# Patient Record
Sex: Male | Born: 1960
Health system: Southern US, Community
[De-identification: ages and names within clinical notes are randomized; demographics above are authoritative.]

## PROBLEM LIST (undated history)

## (undated) ENCOUNTER — Emergency Department (HOSPITAL_COMMUNITY): Payer: Self-pay | Source: Home / Self Care

## (undated) DIAGNOSIS — E119 Type 2 diabetes mellitus without complications: Secondary | ICD-10-CM

## (undated) DIAGNOSIS — K760 Fatty (change of) liver, not elsewhere classified: Secondary | ICD-10-CM

## (undated) DIAGNOSIS — M14671 Charcot's joint, right ankle and foot: Secondary | ICD-10-CM

## (undated) DIAGNOSIS — M48061 Spinal stenosis, lumbar region without neurogenic claudication: Secondary | ICD-10-CM

## (undated) DIAGNOSIS — M25811 Other specified joint disorders, right shoulder: Secondary | ICD-10-CM

## (undated) DIAGNOSIS — I10 Essential (primary) hypertension: Secondary | ICD-10-CM

## (undated) DIAGNOSIS — M7541 Impingement syndrome of right shoulder: Secondary | ICD-10-CM

## (undated) DIAGNOSIS — I4891 Unspecified atrial fibrillation: Secondary | ICD-10-CM

## (undated) DIAGNOSIS — M51369 Other intervertebral disc degeneration, lumbar region without mention of lumbar back pain or lower extremity pain: Secondary | ICD-10-CM

## (undated) DIAGNOSIS — M67919 Unspecified disorder of synovium and tendon, unspecified shoulder: Secondary | ICD-10-CM

## (undated) DIAGNOSIS — E669 Obesity, unspecified: Secondary | ICD-10-CM

## (undated) DIAGNOSIS — M5136 Other intervertebral disc degeneration, lumbar region: Secondary | ICD-10-CM

## (undated) HISTORY — PX: KNEE ARTHROSCOPY W/ ACL RECONSTRUCTION: SHX1858

---

## 1988-03-30 HISTORY — PX: ANTERIOR CRUCIATE LIGAMENT REPAIR: SHX115

## 1997-09-13 ENCOUNTER — Emergency Department (HOSPITAL_COMMUNITY): Admission: EM | Admit: 1997-09-13 | Discharge: 1997-09-13 | Payer: Self-pay | Admitting: Emergency Medicine

## 2001-03-02 ENCOUNTER — Emergency Department (HOSPITAL_COMMUNITY): Admission: EM | Admit: 2001-03-02 | Discharge: 2001-03-02 | Payer: Self-pay | Admitting: Emergency Medicine

## 2001-03-02 ENCOUNTER — Encounter: Payer: Self-pay | Admitting: Emergency Medicine

## 2003-11-21 ENCOUNTER — Emergency Department (HOSPITAL_COMMUNITY): Admission: EM | Admit: 2003-11-21 | Discharge: 2003-11-21 | Payer: Self-pay | Admitting: Emergency Medicine

## 2008-05-09 ENCOUNTER — Emergency Department (HOSPITAL_COMMUNITY): Admission: EM | Admit: 2008-05-09 | Discharge: 2008-05-10 | Payer: Self-pay | Admitting: Emergency Medicine

## 2010-04-09 ENCOUNTER — Inpatient Hospital Stay (HOSPITAL_COMMUNITY)
Admission: EM | Admit: 2010-04-09 | Discharge: 2010-04-13 | Payer: Self-pay | Source: Home / Self Care | Attending: Internal Medicine | Admitting: Internal Medicine

## 2010-04-14 LAB — GLUCOSE, CAPILLARY
Glucose-Capillary: 193 mg/dL — ABNORMAL HIGH (ref 70–99)
Glucose-Capillary: 134 mg/dL — ABNORMAL HIGH (ref 70–99)
Glucose-Capillary: 182 mg/dL — ABNORMAL HIGH (ref 70–99)
Glucose-Capillary: 216 mg/dL — ABNORMAL HIGH (ref 70–99)
Glucose-Capillary: 103 mg/dL — ABNORMAL HIGH (ref 70–99)
Glucose-Capillary: 140 mg/dL — ABNORMAL HIGH (ref 70–99)
Glucose-Capillary: 204 mg/dL — ABNORMAL HIGH (ref 70–99)
Glucose-Capillary: 158 mg/dL — ABNORMAL HIGH (ref 70–99)
Glucose-Capillary: 173 mg/dL — ABNORMAL HIGH (ref 70–99)
Glucose-Capillary: 123 mg/dL — ABNORMAL HIGH (ref 70–99)
Glucose-Capillary: 161 mg/dL — ABNORMAL HIGH (ref 70–99)
Glucose-Capillary: 144 mg/dL — ABNORMAL HIGH (ref 70–99)
Glucose-Capillary: 99 mg/dL (ref 70–99)
Glucose-Capillary: 142 mg/dL — ABNORMAL HIGH (ref 70–99)

## 2010-04-14 LAB — CBC
HCT: 46.7 % (ref 39.0–52.0)
HCT: 43.5 % (ref 39.0–52.0)
Hemoglobin: 16 g/dL (ref 13.0–17.0)
Hemoglobin: 14.7 g/dL (ref 13.0–17.0)
MCH: 31.9 pg (ref 26.0–34.0)
MCH: 31.8 pg (ref 26.0–34.0)
MCHC: 34.3 g/dL (ref 30.0–36.0)
MCHC: 33.8 g/dL (ref 30.0–36.0)
MCV: 93 fL (ref 78.0–100.0)
MCV: 94.2 fL (ref 78.0–100.0)
Platelets: 187 10*3/uL (ref 150–400)
Platelets: 156 10*3/uL (ref 150–400)
RBC: 5.02 MIL/uL (ref 4.22–5.81)
RBC: 4.62 MIL/uL (ref 4.22–5.81)
RDW: 13.1 % (ref 11.5–15.5)
RDW: 13.1 % (ref 11.5–15.5)
WBC: 14.2 10*3/uL — ABNORMAL HIGH (ref 4.0–10.5)
WBC: 11.7 10*3/uL — ABNORMAL HIGH (ref 4.0–10.5)

## 2010-04-14 LAB — POCT I-STAT, CHEM 8
BUN: 12 mg/dL (ref 6–23)
Calcium, Ion: 1.11 mmol/L — ABNORMAL LOW (ref 1.12–1.32)
Chloride: 106 mEq/L (ref 96–112)
Creatinine, Ser: 0.9 mg/dL (ref 0.4–1.5)
Glucose, Bld: 148 mg/dL — ABNORMAL HIGH (ref 70–99)
HCT: 48 % (ref 39.0–52.0)
Hemoglobin: 16.3 g/dL (ref 13.0–17.0)
Potassium: 4.3 mEq/L (ref 3.5–5.1)
Sodium: 137 mEq/L (ref 135–145)
TCO2: 24 mmol/L (ref 0–100)

## 2010-04-14 LAB — HEMOGLOBIN A1C
Hgb A1c MFr Bld: 8.1 % — ABNORMAL HIGH (ref <5.7)
Mean Plasma Glucose: 186 mg/dL — ABNORMAL HIGH (ref <117)

## 2010-04-14 LAB — COMPREHENSIVE METABOLIC PANEL
ALT: 29 U/L (ref 0–53)
AST: 21 U/L (ref 0–37)
Albumin: 3 g/dL — ABNORMAL LOW (ref 3.5–5.2)
Alkaline Phosphatase: 64 U/L (ref 39–117)
BUN: 9 mg/dL (ref 6–23)
CO2: 26 mEq/L (ref 19–32)
Calcium: 8.5 mg/dL (ref 8.4–10.5)
Chloride: 106 mEq/L (ref 96–112)
Creatinine, Ser: 0.72 mg/dL (ref 0.4–1.5)
GFR calc Af Amer: >60 mL/min (ref >60)
GFR calc non Af Amer: >60 mL/min (ref >60)
Glucose, Bld: 116 mg/dL — ABNORMAL HIGH (ref 70–99)
Potassium: 4.1 mEq/L (ref 3.5–5.1)
Sodium: 137 mEq/L (ref 135–145)
Total Bilirubin: 0.8 mg/dL (ref 0.3–1.2)
Total Protein: 5.9 g/dL — ABNORMAL LOW (ref 6.0–8.3)

## 2010-04-14 LAB — WOUND CULTURE

## 2010-04-14 LAB — BASIC METABOLIC PANEL
BUN: 11 mg/dL (ref 6–23)
BUN: 8 mg/dL (ref 6–23)
CO2: 26 mEq/L (ref 19–32)
CO2: 26 mEq/L (ref 19–32)
Calcium: 8.6 mg/dL (ref 8.4–10.5)
Calcium: 9.3 mg/dL (ref 8.4–10.5)
Chloride: 106 mEq/L (ref 96–112)
Chloride: 104 mEq/L (ref 96–112)
Creatinine, Ser: 0.84 mg/dL (ref 0.4–1.5)
Creatinine, Ser: 0.86 mg/dL (ref 0.4–1.5)
GFR calc Af Amer: >60 mL/min (ref >60)
GFR calc Af Amer: >60 mL/min (ref >60)
GFR calc non Af Amer: >60 mL/min (ref >60)
GFR calc non Af Amer: >60 mL/min (ref >60)
Glucose, Bld: 140 mg/dL — ABNORMAL HIGH (ref 70–99)
Glucose, Bld: 116 mg/dL — ABNORMAL HIGH (ref 70–99)
Potassium: 3.9 mEq/L (ref 3.5–5.1)
Potassium: 4.2 mEq/L (ref 3.5–5.1)
Sodium: 140 mEq/L (ref 135–145)
Sodium: 138 mEq/L (ref 135–145)

## 2010-04-14 LAB — DIFFERENTIAL
Basophils Absolute: 0 10*3/uL (ref 0.0–0.1)
Basophils Absolute: 0 10*3/uL (ref 0.0–0.1)
Basophils Relative: 0 % (ref 0–1)
Basophils Relative: 0 % (ref 0–1)
Eosinophils Absolute: 0.1 10*3/uL (ref 0.0–0.7)
Eosinophils Absolute: 0.2 10*3/uL (ref 0.0–0.7)
Eosinophils Relative: 1 % (ref 0–5)
Eosinophils Relative: 2 % (ref 0–5)
Lymphocytes Relative: 8 % — ABNORMAL LOW (ref 12–46)
Lymphocytes Relative: 13 % (ref 12–46)
Lymphs Abs: 1.1 10*3/uL (ref 0.7–4.0)
Lymphs Abs: 1.5 10*3/uL (ref 0.7–4.0)
Monocytes Absolute: 1.6 10*3/uL — ABNORMAL HIGH (ref 0.1–1.0)
Monocytes Absolute: 1.5 10*3/uL — ABNORMAL HIGH (ref 0.1–1.0)
Monocytes Relative: 12 % (ref 3–12)
Monocytes Relative: 13 % — ABNORMAL HIGH (ref 3–12)
Neutro Abs: 11.3 10*3/uL — ABNORMAL HIGH (ref 1.7–7.7)
Neutro Abs: 8.4 10*3/uL — ABNORMAL HIGH (ref 1.7–7.7)
Neutrophils Relative %: 80 % — ABNORMAL HIGH (ref 43–77)
Neutrophils Relative %: 72 % (ref 43–77)

## 2010-04-14 LAB — LIPID PANEL
Cholesterol: 192 mg/dL (ref 0–200)
HDL: 50 mg/dL (ref >39)
LDL Cholesterol: 110 mg/dL — ABNORMAL HIGH (ref 0–99)
Total CHOL/HDL Ratio: 3.8 RATIO
Triglycerides: 161 mg/dL — ABNORMAL HIGH (ref <150)
VLDL: 32 mg/dL (ref 0–40)

## 2010-04-14 LAB — TSH: TSH: 3.562 u[IU]/mL (ref 0.350–4.500)

## 2010-04-16 LAB — ANAEROBIC CULTURE

## 2010-05-27 ENCOUNTER — Ambulatory Visit (INDEPENDENT_AMBULATORY_CARE_PROVIDER_SITE_OTHER): Payer: Self-pay | Admitting: Family Medicine

## 2010-05-27 ENCOUNTER — Encounter: Payer: Self-pay | Admitting: Family Medicine

## 2010-05-27 DIAGNOSIS — M25519 Pain in unspecified shoulder: Secondary | ICD-10-CM

## 2010-05-27 DIAGNOSIS — M24819 Other specific joint derangements of unspecified shoulder, not elsewhere classified: Secondary | ICD-10-CM

## 2010-06-05 NOTE — Assessment & Plan Note (Signed)
Summary: NP,SHOULDER INJURY FROM FOOTBALL X 25 YEARS,[facility]   Vital Signs:  Patient profile:   50 year old male Height:      73 inches Weight:      292 pounds BMI:     38.66 BP sitting:   157 / 116  Vitals Entered By: Caesar Chestnut RN (May 27, 2010 2:07 PM)  History of Present Illness: Three months ago fell on elbow in friends yard -- Drove up into shoulder joint Pain on anterior aspect of shoulder. Dull pain at all times - Intense sharp pain   Ibuprofen 10 in AM -daily - helps Ice - made worse  HX: Hospital for infection - had X-ray January 11 or 12th of Right shoulder Broken right clavicle 23 years ago  The patient noted above presents with shoulder pain that has been ongoing for 3 mo there is h/o falling on his elbow onto a tree stump The patient denies neck pain or radicular symptoms. Denies dislocation, subluxation, separation of the shoulder. The patient does complain of pain in the overhead plane with abd  Medications Tried: advil Tried PT: No  Prior shoulder Injury: midshaft R clavicle fracture, displaced, at age 61  Handedness: R  XR, R shoulder series independently reviewed There is evidence of prior displaced clavicle fracture with adequate ossification but large bony ossification seen. minimal impingement anatomy. effectively no OA.  Past History:  Past Medical History: DM 2 HTN L hand infection, hosp, IV ABX (02/2010)  Family History: DM F HTN F CAD F  Social History: TOB + 3-4 beers a day not working  Review of Systems       REVIEW OF SYSTEMS  GEN: No systemic complaints, no fevers, chills, sweats, or other acute illnesses MSK: Detailed in the HPI GI: tolerating PO intake without difficulty Neuro: No numbness, parasthesias, or tingling associated. Otherwise the pertinent positives of the ROS are noted above.    Physical Exam  General:  GEN: Well-developed,well-nourished,in no acute distress; alert,appropriate and cooperative  throughout examination HEENT: Normocephalic and atraumatic without obvious abnormalities. No apparent alopecia or balding. Ears, externally no deformities PULM: Breathing comfortably in no respiratory distress EXT: No clubbing, cyanosis, or edema PSYCH: Normally interactive. Cooperative during the interview. Pleasant. Friendly and conversant. Not anxious or depressed appearing. Normal, full affect.  Msk:  Shoulder: R Inspection: bony prominence, mid clavicle Ecchymosis/edema: neg  AC joint, scapula, clavicle: mild ttp ac joint Cervical spine: NT, full ROM Spurling's: neg Abduction: full, 4+/5 - painful arc, painful resistance Flexion: full, 5/5 IR, full, lift-off: 5/5 ER at neutral: full, 5/5 AC crossover and compression: mild pos Neer: neg Hawkins: pos Drop Test: neg Empty Can: pos Supraspinatus insertion: NT Bicipital groove: NT Speed's: neg Yergason's: neg APPREHENSION POS CRANK AND CLUNK POS, LIGHT OBRIENS EQUIVOCAL JOBE RELOCATION TEST IS POSITIVE Sulcus sign: neg Scapular dyskinesis: none C5-T1 intact Sensation intact Grip 5/5    Impression & Recommendations:  Problem # 1:  SHOULDER JOINT INSTABILITY (ICD-718.81) Assessment New  probable subluxation event, now with instability and apprehension, causing secondary impingement.  for pain control. SubAC Injection Verbal consent was obtained from the patient. Risks, benefits, and alternatives were explained. Patient prepped with Betadine and Ethyl Chloride used for anesthesia. The subacromial space was injected using the posterior approach. The patient tolerated the procedure well and had decreased pain post injection. No complications. Injection: 9 cc of Lidocaine 1% and 1cc of Kenalog 40 mg. Needle: 22 gauge   reviewed rtc and scapular stabilization PT a good  idea, will call when insurance sorted out / medicaid or assistance.  Orders: Joint Aspirate / Injection, Large (20610) Kenalog 10mg  (4units)  (J3301)  Problem # 2:  SHOULDER PAIN, RIGHT (ICD-719.41) Assessment: New  Orders: Joint Aspirate / Injection, Large (20610) Kenalog 10mg  (4units) (J3301)   Orders Added: 1)  New Patient Level III [id number] 2)  Joint Aspirate / Injection, Large [id number] 3)  Kenalog 10mg  (4units) [J3301]

## 2010-07-08 ENCOUNTER — Ambulatory Visit: Payer: Self-pay | Admitting: Family Medicine

## 2010-09-11 ENCOUNTER — Emergency Department (HOSPITAL_COMMUNITY)
Admission: EM | Admit: 2010-09-11 | Discharge: 2010-09-11 | Disposition: A | Discharge status: Home / Self Care | Payer: Self-pay | Attending: Emergency Medicine | Admitting: Emergency Medicine

## 2010-09-11 ENCOUNTER — Emergency Department (HOSPITAL_COMMUNITY): Payer: Self-pay

## 2010-09-11 DIAGNOSIS — X58XXXA Exposure to other specified factors, initial encounter: Secondary | ICD-10-CM | POA: Insufficient documentation

## 2010-09-11 DIAGNOSIS — Y9229 Other specified public building as the place of occurrence of the external cause: Secondary | ICD-10-CM | POA: Insufficient documentation

## 2010-09-11 DIAGNOSIS — R079 Chest pain, unspecified: Secondary | ICD-10-CM | POA: Insufficient documentation

## 2010-09-11 DIAGNOSIS — S20219A Contusion of unspecified front wall of thorax, initial encounter: Secondary | ICD-10-CM | POA: Insufficient documentation

## 2013-02-09 ENCOUNTER — Emergency Department (HOSPITAL_COMMUNITY)
Admission: EM | Admit: 2013-02-09 | Discharge: 2013-02-09 | Disposition: A | Discharge status: Home / Self Care | Payer: Self-pay | Attending: Emergency Medicine | Admitting: Emergency Medicine

## 2013-02-09 ENCOUNTER — Encounter (HOSPITAL_COMMUNITY): Payer: Self-pay | Admitting: Emergency Medicine

## 2013-02-09 DIAGNOSIS — I10 Essential (primary) hypertension: Secondary | ICD-10-CM | POA: Insufficient documentation

## 2013-02-09 DIAGNOSIS — Z203 Contact with and (suspected) exposure to rabies: Secondary | ICD-10-CM | POA: Insufficient documentation

## 2013-02-09 DIAGNOSIS — F17200 Nicotine dependence, unspecified, uncomplicated: Secondary | ICD-10-CM | POA: Insufficient documentation

## 2013-02-09 DIAGNOSIS — Z23 Encounter for immunization: Secondary | ICD-10-CM | POA: Insufficient documentation

## 2013-02-09 DIAGNOSIS — E119 Type 2 diabetes mellitus without complications: Secondary | ICD-10-CM | POA: Insufficient documentation

## 2013-02-09 HISTORY — DX: Essential (primary) hypertension: I10

## 2013-02-09 HISTORY — DX: Type 2 diabetes mellitus without complications: E11.9

## 2013-02-09 MED ORDER — RABIES IMMUNE GLOBULIN 150 UNIT/ML IM INJ
20.0000 [IU]/kg | INJECTION | Freq: Once | INTRAMUSCULAR | Status: AC
  Administered 2013-02-09: 2625 [IU] via INTRAMUSCULAR
  Filled 2013-02-09: qty 17.5

## 2013-02-09 MED ORDER — RABIES VACCINE, PCEC IM SUSR
1.0000 mL | Freq: Once | INTRAMUSCULAR | Status: AC
  Administered 2013-02-09: 1 mL via INTRAMUSCULAR
  Filled 2013-02-09: qty 1

## 2013-02-09 NOTE — ED Provider Notes (Signed)
CSN: [id number]     Arrival date & time 02/09/13  1500 History  This chart was scribed for non-physician practitioner, Delos Haring, PA-C working with Merryl Hacker, MD by Frederich Balding, ED scribe. This patient was seen in room WTR6/WTR6 and the patient's care was started at 3:25 PM.   Chief Complaint  Patient presents with  . Rabies Injection    exposed to rabid animal on saturday   The history is provided by the patient. No language interpreter was used.   HPI Comments: Jeffery [name] is a 52 y.o. male who presents to the Emergency Department complaining of rabies exposure 5 days ago. Pt picked up a dead racoon and was referred by the health department today. They called him and confirmed that the racoon did have rabies. He was never bit by the [place]. Pt's last tetanus was one year ago. He denies any current symptoms.   Past Medical History  Diagnosis Date  . Diabetes mellitus without complication   . Hypertension    Past Surgical History  Procedure Laterality Date  . Knee surgery     Family History  Problem Relation Age of Onset  . Diabetes Father   . Hypertension Father   . Heart failure Father    History  Substance Use Topics  . Smoking status: Current Some Day Smoker  . Smokeless tobacco: Not on file  . Alcohol Use: Yes    Review of Systems  All other systems reviewed and are negative.    Allergies  Review of patient's allergies indicates no known allergies.  Home Medications   Current Outpatient Rx  Name  Route  Sig  Dispense  Refill  . ibuprofen (ADVIL,MOTRIN) 200 MG tablet   Oral   Take 200 mg by mouth every 6 (six) hours as needed (cramping).           BP 163/86  Pulse 90  Temp(Src) 98.2 F (36.8 C) (Oral)  Resp 20  SpO2 96%  Physical Exam  Nursing note and vitals reviewed. Constitutional: He is oriented to person, place, and time. He appears well-developed and well-nourished. No distress.  HENT:  Head: Normocephalic and  atraumatic.  Eyes: EOM are normal.  Neck: Neck supple. No tracheal deviation present.  Cardiovascular: Normal rate, regular rhythm and normal heart sounds.   No murmur heard. Pulmonary/Chest: Effort normal and breath sounds normal. No respiratory distress. He has no wheezes. He has no rales.  Musculoskeletal: Normal range of motion.  Neurological: He is alert and oriented to person, place, and time.  Skin: Skin is warm and dry.  Psychiatric: He has a normal mood and affect. His behavior is normal.    ED Course  Procedures (including critical care time)  DIAGNOSTIC STUDIES: Oxygen Saturation is 96% on RA, normal by my interpretation.    COORDINATION OF CARE: 3:29 PM-Discussed treatment plan which includes rabies vaccination with pt at bedside and pt agreed to plan.   Labs Review Labs Reviewed - No data to display Imaging Review No results found.  EKG Interpretation   None       MDM   1. Rabies exposure     52 y.o.Jeffery [name]'s evaluation in the Emergency Department is complete. It has been determined that no acute conditions requiring further emergency intervention are present at this time. The patient/guardian have been advised of the diagnosis and plan. We have discussed signs and symptoms that warrant return to the ED, such as changes or worsening in symptoms.  Vital signs are stable at discharge. Filed Vitals:   02/09/13 1506  BP: 163/86  Pulse: 90  Temp: 98.2 F (36.8 C)  Resp: 20    Patient/guardian has voiced understanding and agreed to follow-up with the PCP or specialist.   I personally performed the services described in this documentation, which was scribed in my presence. The recorded information has been reviewed and is accurate.   Linus Mako, PA-C 02/09/13 [phone]

## 2013-02-09 NOTE — Progress Notes (Signed)
[facility] provided pt with a list of primary care resources and a [facility] Orange Card application.  °

## 2013-02-09 NOTE — ED Provider Notes (Signed)
  Medical screening examination/treatment/procedure(s) were performed by non-physician practitioner and as supervising physician I was immediately available for consultation/collaboration.      Carmin Muskrat, MD 02/09/13 2013

## 2013-02-09 NOTE — ED Notes (Signed)
Pt was exposed to a confirmed-rabid racoon 5 days ago. Referred by health department

## 2013-02-12 ENCOUNTER — Emergency Department (HOSPITAL_COMMUNITY)
Admission: EM | Admit: 2013-02-12 | Discharge: 2013-02-12 | Disposition: A | Discharge status: Home / Self Care | Payer: Self-pay | Attending: Emergency Medicine | Admitting: Emergency Medicine

## 2013-02-12 ENCOUNTER — Encounter (HOSPITAL_COMMUNITY): Payer: Self-pay | Admitting: Emergency Medicine

## 2013-02-12 DIAGNOSIS — E119 Type 2 diabetes mellitus without complications: Secondary | ICD-10-CM | POA: Insufficient documentation

## 2013-02-12 DIAGNOSIS — Z23 Encounter for immunization: Secondary | ICD-10-CM | POA: Insufficient documentation

## 2013-02-12 DIAGNOSIS — F17200 Nicotine dependence, unspecified, uncomplicated: Secondary | ICD-10-CM | POA: Insufficient documentation

## 2013-02-12 DIAGNOSIS — I10 Essential (primary) hypertension: Secondary | ICD-10-CM | POA: Insufficient documentation

## 2013-02-12 MED ORDER — RABIES VACCINE, PCEC IM SUSR
1.0000 mL | Freq: Once | INTRAMUSCULAR | Status: AC
  Administered 2013-02-12: 1 mL via INTRAMUSCULAR
  Filled 2013-02-12: qty 1

## 2013-02-12 NOTE — ED Notes (Signed)
Pt here to get the second injections of rabies vaccine.  Pt was seen here on 11/13 for the first round in rabies injections.

## 2013-02-12 NOTE — ED Provider Notes (Signed)
CSN: [id number]     Arrival date & time 02/12/13  1053 History   First MD Initiated Contact with Patient 02/12/13 1059     Chief Complaint  Patient presents with  . Rabies Injection   (Consider location/radiation/quality/duration/timing/severity/associated sxs/prior Treatment) HPI Comments: Patient presents emergency department with chief complaint of needing rabies vaccine. Patient was instructed to receive the vaccine series after handling a raccoon, which was confirmed to have rabies by animal control. Patient was not scratched or bitten by the raccoon, as it was dead at the time that he handle it. Nevertheless, [place] health department requested that he be given the vaccine series. This is the patient's second dose. He has the remaining dosing schedule on a handout. He denies any complaints at this time.  The history is provided by the patient. No language interpreter was used.    Past Medical History  Diagnosis Date  . Diabetes mellitus without complication   . Hypertension    Past Surgical History  Procedure Laterality Date  . Knee surgery     Family History  Problem Relation Age of Onset  . Diabetes Father   . Hypertension Father   . Heart failure Father    History  Substance Use Topics  . Smoking status: Current Some Day Smoker  . Smokeless tobacco: Not on file  . Alcohol Use: Yes    Review of Systems  All other systems reviewed and are negative.    Allergies  Review of patient's allergies indicates no known allergies.  Home Medications   Current Outpatient Rx  Name  Route  Sig  Dispense  Refill  . ibuprofen (ADVIL,MOTRIN) 200 MG tablet   Oral   Take 200 mg by mouth every 6 (six) hours as needed (cramping).          BP 167/88  Pulse 105  Temp(Src) 98.1 F (36.7 C) (Oral)  Resp 18  SpO2 95% Physical Exam  Nursing note and vitals reviewed. Constitutional: He is oriented to person, place, and time. He appears well-developed and well-nourished.   HENT:  Head: Normocephalic and atraumatic.  Eyes: Conjunctivae and EOM are normal.  Neck: Normal range of motion.  Cardiovascular: Normal rate.   Normal rate on my exam  Pulmonary/Chest: Effort normal.  Abdominal: He exhibits no distension.  Musculoskeletal: Normal range of motion.  Neurological: He is alert and oriented to person, place, and time.  Skin: Skin is dry.  Psychiatric: He has a normal mood and affect. His behavior is normal. Judgment and thought content normal.    ED Course  Procedures (including critical care time) Labs Review Labs Reviewed - No data to display Imaging Review No results found.  EKG Interpretation   None       MDM   1. Rabies, need for prophylactic vaccination against     Patient needing rabies vaccine. Will give vaccine, discharge. No other complaints.    Montine Circle, PA-C 02/12/13 1151

## 2013-02-13 NOTE — ED Provider Notes (Signed)
Medical screening examination/treatment/procedure(s) were performed by non-physician practitioner and as supervising physician I was immediately available for consultation/collaboration.  Neta Ehlers, MD 02/13/13 1213

## 2013-02-16 ENCOUNTER — Encounter (HOSPITAL_COMMUNITY): Payer: Self-pay | Admitting: Emergency Medicine

## 2013-02-16 ENCOUNTER — Emergency Department (INDEPENDENT_AMBULATORY_CARE_PROVIDER_SITE_OTHER)
Admission: EM | Admit: 2013-02-16 | Discharge: 2013-02-16 | Disposition: A | Discharge status: Home / Self Care | Payer: Self-pay | Source: Home / Self Care

## 2013-02-16 DIAGNOSIS — Z203 Contact with and (suspected) exposure to rabies: Secondary | ICD-10-CM

## 2013-02-16 MED ORDER — RABIES VACCINE, PCEC IM SUSR
INTRAMUSCULAR | Status: AC
  Filled 2013-02-16: qty 1

## 2013-02-16 MED ORDER — RABIES VACCINE, PCEC IM SUSR
1.0000 mL | Freq: Once | INTRAMUSCULAR | Status: AC
  Administered 2013-02-16: 1 mL via INTRAMUSCULAR

## 2013-02-16 NOTE — ED Notes (Signed)
Rabies injection 

## 2013-02-22 ENCOUNTER — Emergency Department (INDEPENDENT_AMBULATORY_CARE_PROVIDER_SITE_OTHER)
Admission: EM | Admit: 2013-02-22 | Discharge: 2013-02-22 | Disposition: A | Discharge status: Home / Self Care | Payer: Self-pay | Source: Home / Self Care | Attending: Emergency Medicine | Admitting: Emergency Medicine

## 2013-02-22 ENCOUNTER — Encounter (HOSPITAL_COMMUNITY): Payer: Self-pay | Admitting: Emergency Medicine

## 2013-02-22 DIAGNOSIS — Z23 Encounter for immunization: Secondary | ICD-10-CM

## 2013-02-22 MED ORDER — RABIES VACCINE, PCEC IM SUSR
INTRAMUSCULAR | Status: AC
  Filled 2013-02-22: qty 1

## 2013-02-22 MED ORDER — RABIES VACCINE, PCEC IM SUSR
1.0000 mL | Freq: Once | INTRAMUSCULAR | Status: AC
  Administered 2013-02-22: 1 mL via INTRAMUSCULAR

## 2013-02-22 NOTE — ED Notes (Addendum)
Her for rabies injection, day #14 per records w patient

## 2014-10-01 ENCOUNTER — Encounter (HOSPITAL_COMMUNITY): Payer: Self-pay | Admitting: *Deleted

## 2014-10-01 ENCOUNTER — Emergency Department (HOSPITAL_COMMUNITY): Payer: Self-pay

## 2014-10-01 ENCOUNTER — Inpatient Hospital Stay (HOSPITAL_COMMUNITY)
Admission: EM | Admit: 2014-10-01 | Discharge: 2014-10-07 | DRG: 504 | Disposition: A | Discharge status: Home Health | Payer: Self-pay | Attending: Internal Medicine | Admitting: Internal Medicine

## 2014-10-01 DIAGNOSIS — K5900 Constipation, unspecified: Secondary | ICD-10-CM | POA: Diagnosis not present

## 2014-10-01 DIAGNOSIS — L03116 Cellulitis of left lower limb: Secondary | ICD-10-CM | POA: Diagnosis present

## 2014-10-01 DIAGNOSIS — B999 Unspecified infectious disease: Secondary | ICD-10-CM

## 2014-10-01 DIAGNOSIS — E1165 Type 2 diabetes mellitus with hyperglycemia: Secondary | ICD-10-CM | POA: Diagnosis present

## 2014-10-01 DIAGNOSIS — R739 Hyperglycemia, unspecified: Secondary | ICD-10-CM

## 2014-10-01 DIAGNOSIS — Z01818 Encounter for other preprocedural examination: Secondary | ICD-10-CM

## 2014-10-01 DIAGNOSIS — Z833 Family history of diabetes mellitus: Secondary | ICD-10-CM

## 2014-10-01 DIAGNOSIS — I10 Essential (primary) hypertension: Secondary | ICD-10-CM | POA: Diagnosis present

## 2014-10-01 DIAGNOSIS — B9561 Methicillin susceptible Staphylococcus aureus infection as the cause of diseases classified elsewhere: Secondary | ICD-10-CM | POA: Diagnosis present

## 2014-10-01 DIAGNOSIS — L98499 Non-pressure chronic ulcer of skin of other sites with unspecified severity: Secondary | ICD-10-CM | POA: Diagnosis present

## 2014-10-01 DIAGNOSIS — F17210 Nicotine dependence, cigarettes, uncomplicated: Secondary | ICD-10-CM | POA: Diagnosis present

## 2014-10-01 DIAGNOSIS — R52 Pain, unspecified: Secondary | ICD-10-CM

## 2014-10-01 DIAGNOSIS — E11610 Type 2 diabetes mellitus with diabetic neuropathic arthropathy: Secondary | ICD-10-CM | POA: Diagnosis present

## 2014-10-01 DIAGNOSIS — I96 Gangrene, not elsewhere classified: Secondary | ICD-10-CM | POA: Diagnosis present

## 2014-10-01 DIAGNOSIS — E11622 Type 2 diabetes mellitus with other skin ulcer: Secondary | ICD-10-CM | POA: Diagnosis present

## 2014-10-01 DIAGNOSIS — M869 Osteomyelitis, unspecified: Principal | ICD-10-CM | POA: Diagnosis present

## 2014-10-01 DIAGNOSIS — E118 Type 2 diabetes mellitus with unspecified complications: Secondary | ICD-10-CM

## 2014-10-01 DIAGNOSIS — Z8249 Family history of ischemic heart disease and other diseases of the circulatory system: Secondary | ICD-10-CM

## 2014-10-01 HISTORY — DX: Other intervertebral disc degeneration, lumbar region: M51.36

## 2014-10-01 HISTORY — DX: Unspecified disorder of synovium and tendon, unspecified shoulder: M67.919

## 2014-10-01 HISTORY — DX: Other intervertebral disc degeneration, lumbar region without mention of lumbar back pain or lower extremity pain: M51.369

## 2014-10-01 LAB — RAPID URINE DRUG SCREEN, HOSP PERFORMED
Amphetamines: NOT DETECTED
Barbiturates: NOT DETECTED
Benzodiazepines: NOT DETECTED
Cocaine: POSITIVE — AB
Opiates: POSITIVE — AB
Tetrahydrocannabinol: NOT DETECTED

## 2014-10-01 LAB — GLUCOSE, CAPILLARY
Glucose-Capillary: 243 mg/dL — ABNORMAL HIGH (ref 65–99)
Glucose-Capillary: 460 mg/dL — ABNORMAL HIGH (ref 65–99)

## 2014-10-01 LAB — URINALYSIS, ROUTINE W REFLEX MICROSCOPIC
Bilirubin Urine: NEGATIVE
Glucose, UA: >1000 mg/dL — AB
Hgb urine dipstick: NEGATIVE
Ketones, ur: 15 mg/dL — AB
Leukocytes, UA: NEGATIVE
Nitrite: NEGATIVE
Protein, ur: NEGATIVE mg/dL
Specific Gravity, Urine: 1.026 (ref 1.005–1.030)
Urobilinogen, UA: 0.2 mg/dL (ref 0.0–1.0)
pH: 5.5 (ref 5.0–8.0)

## 2014-10-01 LAB — BASIC METABOLIC PANEL
Anion gap: 11 (ref 5–15)
Anion gap: 9 (ref 5–15)
BUN: 9 mg/dL (ref 6–20)
BUN: 10 mg/dL (ref 6–20)
CO2: 26 mmol/L (ref 22–32)
CO2: 26 mmol/L (ref 22–32)
Calcium: 9.5 mg/dL (ref 8.9–10.3)
Calcium: 8.6 mg/dL — ABNORMAL LOW (ref 8.9–10.3)
Chloride: 96 mmol/L — ABNORMAL LOW (ref 101–111)
Chloride: 97 mmol/L — ABNORMAL LOW (ref 101–111)
Creatinine, Ser: 0.69 mg/dL (ref 0.61–1.24)
Creatinine, Ser: 0.71 mg/dL (ref 0.61–1.24)
GFR calc Af Amer: >60 mL/min (ref >60)
GFR calc Af Amer: >60 mL/min (ref >60)
GFR calc non Af Amer: >60 mL/min (ref >60)
GFR calc non Af Amer: >60 mL/min (ref >60)
Glucose, Bld: 429 mg/dL — ABNORMAL HIGH (ref 65–99)
Glucose, Bld: 445 mg/dL — ABNORMAL HIGH (ref 65–99)
Potassium: 4.3 mmol/L (ref 3.5–5.1)
Potassium: 4.2 mmol/L (ref 3.5–5.1)
Sodium: 133 mmol/L — ABNORMAL LOW (ref 135–145)
Sodium: 132 mmol/L — ABNORMAL LOW (ref 135–145)

## 2014-10-01 LAB — URINE MICROSCOPIC-ADD ON

## 2014-10-01 LAB — CBC WITH DIFFERENTIAL/PLATELET
Basophils Absolute: 0 10*3/uL (ref 0.0–0.1)
Basophils Relative: 0 % (ref 0–1)
Eosinophils Absolute: 0.3 10*3/uL (ref 0.0–0.7)
Eosinophils Relative: 3 % (ref 0–5)
HCT: 44.4 % (ref 39.0–52.0)
Hemoglobin: 15.3 g/dL (ref 13.0–17.0)
Lymphocytes Relative: 16 % (ref 12–46)
Lymphs Abs: 1.5 10*3/uL (ref 0.7–4.0)
MCH: 30.1 pg (ref 26.0–34.0)
MCHC: 34.5 g/dL (ref 30.0–36.0)
MCV: 87.4 fL (ref 78.0–100.0)
Monocytes Absolute: 0.9 10*3/uL (ref 0.1–1.0)
Monocytes Relative: 10 % (ref 3–12)
Neutro Abs: 6.3 10*3/uL (ref 1.7–7.7)
Neutrophils Relative %: 71 % (ref 43–77)
Platelets: 258 10*3/uL (ref 150–400)
RBC: 5.08 MIL/uL (ref 4.22–5.81)
RDW: 12.4 % (ref 11.5–15.5)
WBC: 9 10*3/uL (ref 4.0–10.5)

## 2014-10-01 LAB — CBG MONITORING, ED
Glucose-Capillary: 442 mg/dL — ABNORMAL HIGH (ref 65–99)
Glucose-Capillary: 291 mg/dL — ABNORMAL HIGH (ref 65–99)

## 2014-10-01 LAB — GRAM STAIN: Special Requests: NORMAL

## 2014-10-01 MED ORDER — SODIUM CHLORIDE 0.9 % IV BOLUS (SEPSIS)
500.0000 mL | Freq: Once | INTRAVENOUS | Status: AC
  Administered 2014-10-01: 500 mL via INTRAVENOUS

## 2014-10-01 MED ORDER — SODIUM CHLORIDE 0.9 % IV BOLUS (SEPSIS)
1000.0000 mL | Freq: Once | INTRAVENOUS | Status: AC
  Administered 2014-10-01: 1000 mL via INTRAVENOUS

## 2014-10-01 MED ORDER — OXYCODONE HCL 5 MG PO TABS
5.0000 mg | ORAL_TABLET | ORAL | Status: DC | PRN
  Administered 2014-10-01 – 2014-10-07 (×30): 5 mg via ORAL
  Filled 2014-10-01 (×30): qty 1

## 2014-10-01 MED ORDER — AMLODIPINE BESYLATE 5 MG PO TABS
5.0000 mg | ORAL_TABLET | Freq: Every day | ORAL | Status: DC
  Administered 2014-10-01 – 2014-10-07 (×7): 5 mg via ORAL
  Filled 2014-10-01 (×7): qty 1

## 2014-10-01 MED ORDER — ALUM & MAG HYDROXIDE-SIMETH 200-200-20 MG/5ML PO SUSP: 30.0000 mL | Freq: Four times a day (QID) | ORAL | Status: DC | PRN

## 2014-10-01 MED ORDER — PNEUMOCOCCAL VAC POLYVALENT 25 MCG/0.5ML IJ INJ
0.5000 mL | INJECTION | INTRAMUSCULAR | Status: AC
  Administered 2014-10-03: 0.5 mL via INTRAMUSCULAR
  Filled 2014-10-01: qty 0.5

## 2014-10-01 MED ORDER — ACETAMINOPHEN 650 MG RE SUPP: 650.0000 mg | Freq: Four times a day (QID) | RECTAL | Status: DC | PRN

## 2014-10-01 MED ORDER — HYDROMORPHONE HCL 1 MG/ML IJ SOLN
1.0000 mg | Freq: Once | INTRAMUSCULAR | Status: AC
  Administered 2014-10-01: 1 mg via INTRAVENOUS
  Filled 2014-10-01: qty 1

## 2014-10-01 MED ORDER — PIPERACILLIN-TAZOBACTAM 3.375 G IVPB
3.3750 g | Freq: Three times a day (TID) | INTRAVENOUS | Status: DC
  Administered 2014-10-01 – 2014-10-05 (×11): 3.375 g via INTRAVENOUS
  Filled 2014-10-01 (×14): qty 50

## 2014-10-01 MED ORDER — ONDANSETRON HCL 4 MG PO TABS: 4.0000 mg | ORAL_TABLET | Freq: Four times a day (QID) | ORAL | Status: DC | PRN

## 2014-10-01 MED ORDER — INSULIN ASPART 100 UNIT/ML ~~LOC~~ SOLN
0.0000 [IU] | SUBCUTANEOUS | Status: DC
  Administered 2014-10-01: 3 [IU] via SUBCUTANEOUS
  Administered 2014-10-02: 2 [IU] via SUBCUTANEOUS
  Administered 2014-10-02: 9 [IU] via SUBCUTANEOUS
  Administered 2014-10-02 (×2): 7 [IU] via SUBCUTANEOUS
  Administered 2014-10-02 (×2): 3 [IU] via SUBCUTANEOUS
  Administered 2014-10-03: 5 [IU] via SUBCUTANEOUS
  Administered 2014-10-03 (×2): 3 [IU] via SUBCUTANEOUS
  Administered 2014-10-03: 5 [IU] via SUBCUTANEOUS
  Administered 2014-10-03: 7 [IU] via SUBCUTANEOUS
  Administered 2014-10-03 – 2014-10-04 (×2): 3 [IU] via SUBCUTANEOUS
  Administered 2014-10-04: 5 [IU] via SUBCUTANEOUS
  Administered 2014-10-04: 7 [IU] via SUBCUTANEOUS

## 2014-10-01 MED ORDER — VANCOMYCIN HCL 10 G IV SOLR
2500.0000 mg | Freq: Once | INTRAVENOUS | Status: DC
  Filled 2014-10-01: qty 2500

## 2014-10-01 MED ORDER — MORPHINE SULFATE 4 MG/ML IJ SOLN
4.0000 mg | INTRAMUSCULAR | Status: DC | PRN
  Administered 2014-10-02 – 2014-10-06 (×8): 4 mg via INTRAVENOUS
  Filled 2014-10-01 (×8): qty 1

## 2014-10-01 MED ORDER — ENOXAPARIN SODIUM 40 MG/0.4ML ~~LOC~~ SOLN: 40.0000 mg | SUBCUTANEOUS | Status: DC

## 2014-10-01 MED ORDER — PIPERACILLIN-TAZOBACTAM 3.375 G IVPB
3.3750 g | Freq: Three times a day (TID) | INTRAVENOUS | Status: DC
  Filled 2014-10-01: qty 50

## 2014-10-01 MED ORDER — SODIUM CHLORIDE 0.9 % IV SOLN
INTRAVENOUS | Status: DC
  Administered 2014-10-01 – 2014-10-06 (×6): via INTRAVENOUS

## 2014-10-01 MED ORDER — INSULIN ASPART 100 UNIT/ML ~~LOC~~ SOLN
10.0000 [IU] | Freq: Once | SUBCUTANEOUS | Status: AC
  Administered 2014-10-01: 10 [IU] via SUBCUTANEOUS

## 2014-10-01 MED ORDER — ONDANSETRON HCL 4 MG/2ML IJ SOLN: 4.0000 mg | Freq: Four times a day (QID) | INTRAMUSCULAR | Status: DC | PRN

## 2014-10-01 MED ORDER — SODIUM CHLORIDE 0.9 % IV SOLN
2000.0000 mg | Freq: Once | INTRAVENOUS | Status: AC
  Administered 2014-10-01: 2000 mg via INTRAVENOUS
  Filled 2014-10-01: qty 2000

## 2014-10-01 MED ORDER — ACETAMINOPHEN 325 MG PO TABS: 650.0000 mg | ORAL_TABLET | Freq: Four times a day (QID) | ORAL | Status: DC | PRN

## 2014-10-01 MED ORDER — VANCOMYCIN HCL IN DEXTROSE 1-5 GM/200ML-% IV SOLN
1000.0000 mg | Freq: Two times a day (BID) | INTRAVENOUS | Status: DC
  Administered 2014-10-02 – 2014-10-05 (×8): 1000 mg via INTRAVENOUS
  Filled 2014-10-01 (×10): qty 200

## 2014-10-01 MED ORDER — PIPERACILLIN-TAZOBACTAM 3.375 G IVPB 30 MIN
3.3750 g | Freq: Once | INTRAVENOUS | Status: AC
  Administered 2014-10-01: 3.375 g via INTRAVENOUS
  Filled 2014-10-01: qty 50

## 2014-10-01 MED ORDER — POLYETHYLENE GLYCOL 3350 17 G PO PACK: 17.0000 g | PACK | Freq: Every day | ORAL | Status: DC | PRN

## 2014-10-01 MED ORDER — MORPHINE SULFATE 4 MG/ML IJ SOLN
6.0000 mg | Freq: Once | INTRAMUSCULAR | Status: AC
  Administered 2014-10-01: 6 mg via INTRAVENOUS
  Filled 2014-10-01: qty 2

## 2014-10-01 NOTE — Progress Notes (Signed)
Report called to [place], [place]

## 2014-10-01 NOTE — Consult Note (Signed)
ORTHOPAEDIC CONSULTATION  REQUESTING PHYSICIAN: Venetia Maxon Rama, MD  Chief Complaint: Left 3rd toe infection  HPI: Jeffery [name] is a 54 y.o. male who presents with left 3rd toe infection. Noticed worsening in the last 3 days.  Denies f/c/n/v.  Poorly controlled diabetic.  Denies pain.  No alleviating or exacerbating features.  No radiation of pain.  Ortho consulted.  Past Medical History  Diagnosis Date  . Diabetes mellitus without complication   . Hypertension   . Rotator cuff disorder   . DDD (degenerative disc disease), lumbar    Past Surgical History  Procedure Laterality Date  . Knee arthroscopy w/ acl reconstruction Right    History   Social History  . Marital Status: Single    Spouse Name: N/A  . Number of Children: N/A  . Years of Education: N/A   Occupational History  . Landscaping & rental handyman    Social History Main Topics  . Smoking status: Current Some Day Smoker  . Smokeless tobacco: Not on file  . Alcohol Use: 0.0 oz/week    0 Standard drinks or equivalent per week     Comment: 6 beers a day  . Drug Use: No  . Sexual Activity: Not on file   Other Topics Concern  . None   Social History Narrative   Lives alone.   Family History  Problem Relation Age of Onset  . Diabetes Father   . Hypertension Father   . Heart failure Father    No Known Allergies Prior to Admission medications   Medication Sig Start Date End Date Taking? Authorizing Provider  ibuprofen (ADVIL,MOTRIN) 200 MG tablet Take 1,200 mg by mouth every 6 (six) hours as needed (For pain.).    Yes Historical Provider, MD  oxymetazoline (AFRIN) 0.05 % nasal spray Place 1 spray into left nostril 2 (two) times daily as needed for congestion.    Yes Historical Provider, MD   Dg Toe 3rd Left  10/01/2014   CLINICAL DATA:  Injury 1 month ago. Pus pocket on the third toe. Third toe is discolored.  EXAM: LEFT THIRD TOE  COMPARISON:  None.  FINDINGS: There is a displaced fracture of  the second toe proximal phalanx with callus formation. Age of this fracture is unknown. There is soft tissue swelling involving the third toe with soft tissue calcifications. There is cortical irregularity and fragmentation of the third toe distal phalanx. Question old fracture of the fifth toe proximal phalanx.  IMPRESSION: Cortical irregularity with bone destruction/fractures of the third toe distal phalanx. Findings are concerning for osteomyelitis. In addition, there is soft tissue swelling with soft tissue calcifications or ossifications in the third toe.  Probable old fractures of the second toe proximal phalanx and fifth toe proximal phalanx.   Electronically Signed   By: Markus Daft M.D.   On: 10/01/2014 10:35    Positive ROS: All other systems have been reviewed and were otherwise negative with the exception of those mentioned in the HPI and as above.  Physical Exam: General: Alert, no acute distress Cardiovascular: No pedal edema Respiratory: No cyanosis, no use of accessory musculature GI: No organomegaly, abdomen is soft and non-tender Skin: No lesions in the area of chief complaint Neurologic: Sensation intact distally Psychiatric: Patient is competent for consent with normal mood and affect Lymphatic: No axillary or cervical lymphadenopathy  MUSCULOSKELETAL:  - sensation intact - 2+ pulses - foot wwp - cellulitis of foot and ankle - wet gangrene of left 3rd toe  Assessment: Left 3rd toe gangrene  Plan: - needs toe amputation possible partial ray amp - continue IV abx - will plan on surgery tomorrow - elevated glucose per hospitalist - NPo after midnight  Thank you for the consult and the opportunity to see [name]. Eduard Roux, MD Box 2:41 PM

## 2014-10-01 NOTE — Progress Notes (Signed)
CRITICAL VALUE ALERT  Critical value received:  Gram Stain: abundant WBC PMN, abundant Gram + cocci in clusters, few Squamous epithelial cells present  Date of notification:  10/01/2014  Time of notification:  1250  Critical value read back:Yes.    Nurse who received alert:  Kenna Gilbert   MD notified (1st page):  Rama  Time of first page:  1253  MD notified (2nd page):  Time of second page:  Responding MD:    Time MD responded:    Kizzie Ide, RN

## 2014-10-01 NOTE — ED Provider Notes (Signed)
CSN: [id number]     Arrival date & time 10/01/14  0847 History   First MD Initiated Contact with Patient 10/01/14 [phone]     Chief Complaint  Patient presents with  . Diabetic Ulcer    left third toe     (Consider location/radiation/quality/duration/timing/severity/associated sxs/prior Treatment) HPI   54 year old male presenting with pain and swelling of his left foot. Wound to his middle toe left foot about a month ago. Progressively worsening. In past few days has had increasing pain, redness and swelling into foot and lower leg. Pt is a diabetic. Previously prescribed metformin but stopped taking because of GI side effects. No fever or chills. No n/v.   Past Medical History  Diagnosis Date  . Diabetes mellitus without complication   . Hypertension    Past Surgical History  Procedure Laterality Date  . Knee surgery     Family History  Problem Relation Age of Onset  . Diabetes Father   . Hypertension Father   . Heart failure Father    History  Substance Use Topics  . Smoking status: Current Some Day Smoker  . Smokeless tobacco: Not on file  . Alcohol Use: Yes    Review of Systems  All systems reviewed and negative, other than as noted in HPI.   Allergies  Review of patient's allergies indicates no known allergies.  Home Medications   Prior to Admission medications   Medication Sig Start Date End Date Taking? Authorizing Provider  ibuprofen (ADVIL,MOTRIN) 200 MG tablet Take 200 mg by mouth every 6 (six) hours as needed (cramping).    Historical Provider, MD   BP 197/111 mmHg  Pulse 99  Temp(Src) 98.5 F (36.9 C) (Oral)  Resp 19  SpO2 96% Physical Exam  Constitutional: He appears well-developed and well-nourished. No distress.  HENT:  Head: Normocephalic and atraumatic.  Eyes: Conjunctivae are normal. Right eye exhibits no discharge. Left eye exhibits no discharge.  Neck: Neck supple.  Cardiovascular: Normal rate, regular rhythm and normal heart sounds.   Exam reveals no gallop and no friction rub.   No murmur heard. Pulmonary/Chest: Effort normal and breath sounds normal. No respiratory distress.  Abdominal: Soft. He exhibits no distension. There is no tenderness.  Musculoskeletal: He exhibits edema and tenderness.  Macerated/gangrenous changes L middle toe. Swelling/erythema/increased warmth over dorsal aspect of foot and extending proximally into lower leg.   Neurological: He is alert.  Skin: Skin is warm and dry.  Psychiatric: He has a normal mood and affect. His behavior is normal. Thought content normal.  Nursing note and vitals reviewed.   ED Course  Procedures (including critical care time) Labs Review Labs Reviewed  BASIC METABOLIC PANEL - Abnormal; Notable for the following:    Sodium 133 (*)    Chloride 96 (*)    Glucose, Bld 429 (*)    All other components within normal limits  URINALYSIS, ROUTINE W REFLEX MICROSCOPIC (NOT AT [facility]) - Abnormal; Notable for the following:    Glucose, UA >1000 (*)    Ketones, ur 15 (*)    All other components within normal limits  CBG MONITORING, ED - Abnormal; Notable for the following:    Glucose-Capillary 442 (*)    All other components within normal limits  GRAM STAIN  CULTURE, BLOOD (ROUTINE X 2)  CULTURE, BLOOD (ROUTINE X 2)  WOUND CULTURE  CBC WITH DIFFERENTIAL/PLATELET  URINE MICROSCOPIC-ADD ON  HEMOGLOBIN A1C    Imaging Review Dg Toe 3rd Left  10/01/2014   CLINICAL  DATA:  Injury 1 month ago. Pus pocket on the third toe. Third toe is discolored.  EXAM: LEFT THIRD TOE  COMPARISON:  None.  FINDINGS: There is a displaced fracture of the second toe proximal phalanx with callus formation. Age of this fracture is unknown. There is soft tissue swelling involving the third toe with soft tissue calcifications. There is cortical irregularity and fragmentation of the third toe distal phalanx. Question old fracture of the fifth toe proximal phalanx.  IMPRESSION: Cortical irregularity with  bone destruction/fractures of the third toe distal phalanx. Findings are concerning for osteomyelitis. In addition, there is soft tissue swelling with soft tissue calcifications or ossifications in the third toe.  Probable old fractures of the second toe proximal phalanx and fifth toe proximal phalanx.   Electronically Signed   By: Markus Daft M.D.   On: 10/01/2014 10:35     EKG Interpretation None      MDM   Final diagnoses:  Osteomyelitis of toe of left foot  Cellulitis of left lower extremity  Hyperglycemia    53yM with osteomyelitis L middle toe with associated cellulitis. Uncontrollable diabetes. Abx. Needs admit for further management.     Virgel Manifold, MD 10/01/14 [phone]

## 2014-10-01 NOTE — ED Notes (Signed)
Patient is aware of urine sample

## 2014-10-01 NOTE — ED Notes (Signed)
Family friend at bedside.  Patient states he is already feeling better with the Morphine injection

## 2014-10-01 NOTE — ED Notes (Signed)
Pt is not currently working.  He does not check blood sugars at home.  He does not take any medications for his blood pressure.  This is all due to lack of finances.

## 2014-10-01 NOTE — Progress Notes (Signed)
ANTIBIOTIC CONSULT NOTE - INITIAL  Pharmacy Consult for vancomycin/zosyn Indication: osteomyelitis/wound infection  No Known Allergies  Patient Measurements: Height: 6\' 2"  (188 cm) Weight: 264 lb 8.8 oz (120 kg) IBW/kg (Calculated) : 82.2 Adjusted Body Weight:   Vital Signs: Temp: 98.5 F (36.9 C) (07/04 1250) Temp Source: Oral (07/04 1250) BP: 148/93 mmHg (07/04 1250) Pulse Rate: 50 (07/04 1250) Intake/Output from previous day:   Intake/Output from this shift: Total I/O In: 1550 [IV Piggyback:1550] Out: 300 [Urine:300]  Labs:  Recent Labs  10/01/14 0956  WBC 9.0  HGB 15.3  PLT 258  CREATININE 0.69   Estimated Creatinine Clearance: 148.7 mL/min (by C-G formula based on Cr of 0.69). No results for input(s): VANCOTROUGH, VANCOPEAK, VANCORANDOM, GENTTROUGH, GENTPEAK, GENTRANDOM, TOBRATROUGH, TOBRAPEAK, TOBRARND, AMIKACINPEAK, AMIKACINTROU, AMIKACIN in the last 72 hours.   Microbiology: Recent Results (from the past 720 hour(s))  Stat Gram stain     Status: None   Collection Time: 10/01/14 11:40 AM  Result Value Ref Range Status   Specimen Description WOUND  Final   Special Requests Normal  Final   Gram Stain   Final    ABUNDANT WBC PRESENT, PREDOMINANTLY PMN ABUNDANT GRAM POSITIVE COCCI IN CLUSTERS FEW SQUAMOUS EPITHELIAL CELLS PRESENT Gram Stain Report Called to,Read Back By and Verified With: Vanessa Barbara [id number] @ [id number] BY J SCOTTON    Report Status 10/01/2014 FINAL  Final    Medical History: Past Medical History  Diagnosis Date  . Diabetes mellitus without complication   . Hypertension   . Rotator cuff disorder   . DDD (degenerative disc disease), lumbar     Medications:  Scheduled:  . amLODipine  5 mg Oral Daily  . [START ON 10/02/2014] enoxaparin (LOVENOX) injection  40 mg Subcutaneous Q24H  . insulin aspart  0-9 Units Subcutaneous 6 times per day  . piperacillin-tazobactam (ZOSYN)  IV  3.375 g Intravenous Q8H  . vancomycin  1,000 mg Intravenous  Q12H   Infusions:  . sodium chloride     PRN: acetaminophen **OR** acetaminophen, alum & mag hydroxide-simeth, morphine injection, ondansetron **OR** ondansetron (ZOFRAN) IV, oxyCODONE, polyethylene glycol Assessment: 54 yo M admitted with osteomyelitis of left middle toe with cellutis PMH- DM, HTN  Goal of Therapy:  Vancomycin trough level 15-20 mcg/ml  Plan:  Zosyn EI Vanc 2000mg  (received this AM) x1 Vancomycin 1000mg  IV q 12 hours. Measure antibiotic drug levels at steady state Follow up culture results  Gypsy Decant 10/01/2014,2:53 PM

## 2014-10-01 NOTE — ED Notes (Signed)
Pt injured toe about a month ago.  Last week noticed pus filled pocket on left foot third toe.  He attempted to drain.

## 2014-10-01 NOTE — ED Notes (Signed)
Pt aware that urine sample is needed.  Unable to void at this time.

## 2014-10-01 NOTE — H&P (Signed)
History and Physical:    Jeffery [name]   [id number] DOB: 1961/02/22 DOA: 10/01/2014  Referring MD/provider: Dr. Eugenio Hoes  PCP: No PCP Per Patient   Chief Complaint: Infection in my foot  History of Present Illness:   Jeffery [name] is an 54 y.o. male with a PMH of type II DM, diagnosed 03/2010 when he presented to the hospital with a hand abscess, but no further aftercare.  He has had a toe wound for about a month.  The patient says he noticed a blister on his toe subsequent to this which he lanced with a razor blade to expel the pus about a week ago.  Over the past 2 days, the toe has become black.  He originally tried to heal the wound with Neosporin and soaked it with Epson Salts, and also used peroxide and alcohol on it.  He denies any loss of feeling in his feet, but notes that the alcohol didn't cause him any discomfort.  No associated fever or chills.  He does feel pain in his ankle from the swelling, and has a dull pain, rated 5-6/10.  He also feels a sensation of heat.  Occasionally has a sudden sharp pain. With regard to his diabetes, he has not followed up with a PCP since his diagnosis 4 years ago, and has tried self management with diet control and weight loss. The patient tells me he is uninsured and has not had the finances to see a PCP.  ROS:   Review of Systems  Constitutional: Positive for weight loss and diaphoresis. Negative for fever, chills and malaise/fatigue.  HENT: Negative.   Eyes: Negative.   Respiratory: Positive for cough.   Cardiovascular: Negative.   Gastrointestinal: Negative.   Genitourinary: Negative.   Musculoskeletal: Positive for back pain and joint pain.  Skin: Positive for rash.  Neurological: Negative.  Negative for weakness.  Endo/Heme/Allergies: Positive for polydipsia.  Psychiatric/Behavioral: Negative.      Past Medical History:   Past Medical History  Diagnosis Date  . Diabetes mellitus without complication   .  Hypertension   . Rotator cuff disorder   . DDD (degenerative disc disease), lumbar     Past Surgical History:   Past Surgical History  Procedure Laterality Date  . Knee arthroscopy w/ acl reconstruction Right     Social History:   History   Social History  . Marital Status: Single    Spouse Name: N/A  . Number of Children: N/A  . Years of Education: N/A   Occupational History  . Landscaping & rental handyman    Social History Main Topics  . Smoking status: Current Some Day Smoker  . Smokeless tobacco: Not on file  . Alcohol Use: 0.0 oz/week    0 Standard drinks or equivalent per week     Comment: 6 beers a day  . Drug Use: No  . Sexual Activity: Not on file   Other Topics Concern  . Not on file   Social History Narrative   Lives alone.    Family history:   Family History  Problem Relation Age of Onset  . Diabetes Father   . Hypertension Father   . Heart failure Father     Allergies   Review of patient's allergies indicates no known allergies.  Current Medications:   Prior to Admission medications   Medication Sig Start Date End Date Taking? Authorizing Provider  ibuprofen (ADVIL,MOTRIN) 200 MG tablet Take 1,200 mg by mouth every 6 (  six) hours as needed (For pain.).    Yes Historical Provider, MD  oxymetazoline (AFRIN) 0.05 % nasal spray Place 1 spray into left nostril 2 (two) times daily as needed for congestion.    Yes Historical Provider, MD    Physical Exam:   Filed Vitals:   10/01/14 0901 10/01/14 1116 10/01/14 1156 10/01/14 1250  BP: 197/111 163/105 152/87 148/93  Pulse: 99 60  50  Temp: 98.5 F (36.9 C) 98.6 F (37 C)  98.5 F (36.9 C)  TempSrc: Oral Oral  Oral  Resp: 19 18  15   Height:    6\' 2"  (1.88 m)  Weight:    120 kg (264 lb 8.8 oz)  SpO2: 96% 96%  96%     Physical Exam: Blood pressure 148/93, pulse 50, temperature 98.5 F (36.9 C), temperature source Oral, resp. rate 15, height 6\' 2"  (1.88 m), weight 120 kg (264 lb 8.8  oz), SpO2 96 %. Gen: No acute distress. Head: Normocephalic, atraumatic. Eyes: PERRL, EOMI, sclerae nonicteric. Mouth: Oropharynx clear. Neck: Supple, no thyromegaly, no lymphadenopathy, no jugular venous distention. Chest: Lungs clear to auscultation bilaterally. CV: Heart sounds are regular, but arrhythmia versus gallop heard every third heart sound. Abdomen: Soft, nontender, nondistended with normal active bowel sounds. Extremities: Extremities show left leg swelling from the knee down, and erythema of the entire foot. Skin: Warm and dry. Left foot wound as noted below. Neuro: Alert and oriented times 3; grossly nonfocal. Psych: Mood and affect normal.      `   Data Review:    Labs: Basic Metabolic Panel:  Recent Labs Lab 10/01/14 0956  NA 133*  K 4.3  CL 96*  CO2 26  GLUCOSE 429*  BUN 9  CREATININE 0.69  CALCIUM 9.5   CBC:  Recent Labs Lab 10/01/14 0956  WBC 9.0  NEUTROABS 6.3  HGB 15.3  HCT 44.4  MCV 87.4  PLT 258   CBG:  Recent Labs Lab 10/01/14 0903 10/01/14 1212  GLUCAP 442* 291*    Radiographic Studies: Dg Toe 3rd Left  10/01/2014   CLINICAL DATA:  Injury 1 month ago. Pus pocket on the third toe. Third toe is discolored.  EXAM: LEFT THIRD TOE  COMPARISON:  None.  FINDINGS: There is a displaced fracture of the second toe proximal phalanx with callus formation. Age of this fracture is unknown. There is soft tissue swelling involving the third toe with soft tissue calcifications. There is cortical irregularity and fragmentation of the third toe distal phalanx. Question old fracture of the fifth toe proximal phalanx.  IMPRESSION: Cortical irregularity with bone destruction/fractures of the third toe distal phalanx. Findings are concerning for osteomyelitis. In addition, there is soft tissue swelling with soft tissue calcifications or ossifications in the third toe.  Probable old fractures of the second toe proximal phalanx and fifth toe proximal  phalanx.   Electronically Signed   By: Markus Daft M.D.   On: 10/01/2014 10:35   *I have personally reviewed the images above*  EKG: Ordered, not done yet.   Assessment/Plan:   Principal Problem:   Toe osteomyelitis, left / gangrene / cellulitis of left leg - Gram stain shows abundant gram-positive cocci in clusters. - Follow-up wound and blood cultures. - Continue empiric vancomycin and Zosyn. Recommend deep tissue cultures on operative specimen. - Seen by orthopedic surgeon, Dr. Erlinda Hong, who plans to take the patient to surgery 10/02/14.  Active Problems:   Questionable arrhythmia - Arrhythmia versus gallop auscultated on admission. Check  12-lead EKG.    Uncontrolled diabetes mellitus with complications - Currently on SSI, moderate scale, every 4 hours. - Check hemoglobin A1c. - Diabetes coordinator consultation.    Accelerated hypertension - We'll start Norvasc. Would switch to an ACE inhibitor after surgery.    DVT prophylaxis - Lovenox post surgery.  Code Status: Full. Family Communication: Thaddeus Linn (mother) [phone] his emergency contact. Friend at the bedside during admission. Disposition Plan: Home when stable postoperatively, likely several days.  Time spent: One hour.  Jontue Crumpacker [facility] Pager [phone] Cell: [phone]   If 7PM-7AM, please contact night-coverage [email or web address] Password [email or web address] 10/01/2014, 4:14 PM

## 2014-10-02 ENCOUNTER — Encounter (HOSPITAL_COMMUNITY)
Admission: EM | Disposition: A | Discharge status: Home Health | Payer: Self-pay | Source: Home / Self Care | Attending: Internal Medicine

## 2014-10-02 ENCOUNTER — Inpatient Hospital Stay (HOSPITAL_COMMUNITY): Payer: Self-pay | Admitting: Anesthesiology

## 2014-10-02 ENCOUNTER — Inpatient Hospital Stay (HOSPITAL_COMMUNITY): Payer: MEDICAID | Admitting: Anesthesiology

## 2014-10-02 DIAGNOSIS — L03116 Cellulitis of left lower limb: Secondary | ICD-10-CM | POA: Insufficient documentation

## 2014-10-02 DIAGNOSIS — M869 Osteomyelitis, unspecified: Secondary | ICD-10-CM | POA: Insufficient documentation

## 2014-10-02 HISTORY — PX: APPLICATION OF WOUND VAC: SHX5189

## 2014-10-02 HISTORY — PX: AMPUTATION: SHX166

## 2014-10-02 LAB — GLUCOSE, CAPILLARY
Glucose-Capillary: 189 mg/dL — ABNORMAL HIGH (ref 65–99)
Glucose-Capillary: 222 mg/dL — ABNORMAL HIGH (ref 65–99)
Glucose-Capillary: 223 mg/dL — ABNORMAL HIGH (ref 65–99)
Glucose-Capillary: 228 mg/dL — ABNORMAL HIGH (ref 65–99)
Glucose-Capillary: 307 mg/dL — ABNORMAL HIGH (ref 65–99)
Glucose-Capillary: 337 mg/dL — ABNORMAL HIGH (ref 65–99)
Glucose-Capillary: 392 mg/dL — ABNORMAL HIGH (ref 65–99)

## 2014-10-02 LAB — CBC
HCT: 44 % (ref 39.0–52.0)
Hemoglobin: 15.3 g/dL (ref 13.0–17.0)
MCH: 31.4 pg (ref 26.0–34.0)
MCHC: 34.8 g/dL (ref 30.0–36.0)
MCV: 90.2 fL (ref 78.0–100.0)
Platelets: 212 10*3/uL (ref 150–400)
RBC: 4.88 MIL/uL (ref 4.22–5.81)
RDW: 12.6 % (ref 11.5–15.5)
WBC: 7.3 10*3/uL (ref 4.0–10.5)

## 2014-10-02 LAB — BASIC METABOLIC PANEL
Anion gap: 6 (ref 5–15)
BUN: 7 mg/dL (ref 6–20)
CO2: 28 mmol/L (ref 22–32)
Calcium: 8.2 mg/dL — ABNORMAL LOW (ref 8.9–10.3)
Chloride: 101 mmol/L (ref 101–111)
Creatinine, Ser: 0.6 mg/dL — ABNORMAL LOW (ref 0.61–1.24)
GFR calc Af Amer: >60 mL/min (ref >60)
GFR calc non Af Amer: >60 mL/min (ref >60)
Glucose, Bld: 221 mg/dL — ABNORMAL HIGH (ref 65–99)
Potassium: 3.7 mmol/L (ref 3.5–5.1)
Sodium: 135 mmol/L (ref 135–145)

## 2014-10-02 LAB — PROTIME-INR
INR: 1.04 (ref 0.00–1.49)
Prothrombin Time: 13.8 seconds (ref 11.6–15.2)

## 2014-10-02 LAB — SURGICAL PCR SCREEN
MRSA, PCR: POSITIVE — AB
Staphylococcus aureus: POSITIVE — AB

## 2014-10-02 LAB — HEMOGLOBIN A1C
Hgb A1c MFr Bld: 13.9 % — ABNORMAL HIGH (ref 4.8–5.6)
Mean Plasma Glucose: 352 mg/dL

## 2014-10-02 SURGERY — AMPUTATION, FOOT, RAY
Anesthesia: General | Site: Foot | Laterality: Left

## 2014-10-02 MED ORDER — 0.9 % SODIUM CHLORIDE (POUR BTL) OPTIME
TOPICAL | Status: DC | PRN
  Administered 2014-10-02: 1000 mL
  Administered 2014-10-02: 3000 mL

## 2014-10-02 MED ORDER — DEXTROSE 5 % IV SOLN
3.0000 g | INTRAVENOUS | Status: AC
  Administered 2014-10-02: 2 g via INTRAVENOUS
  Filled 2014-10-02: qty 3000

## 2014-10-02 MED ORDER — FENTANYL CITRATE (PF) 250 MCG/5ML IJ SOLN
INTRAMUSCULAR | Status: AC
Start: 2014-10-02 — End: 2014-10-02
  Filled 2014-10-02: qty 5

## 2014-10-02 MED ORDER — SODIUM CHLORIDE 0.9 % IV SOLN: INTRAVENOUS | Status: DC

## 2014-10-02 MED ORDER — INSULIN GLARGINE 100 UNIT/ML ~~LOC~~ SOLN
5.0000 [IU] | Freq: Every day | SUBCUTANEOUS | Status: DC
  Administered 2014-10-02: 5 [IU] via SUBCUTANEOUS
  Filled 2014-10-02: qty 0.05

## 2014-10-02 MED ORDER — PROPOFOL 10 MG/ML IV BOLUS
INTRAVENOUS | Status: DC | PRN
  Administered 2014-10-02: 150 mg via INTRAVENOUS

## 2014-10-02 MED ORDER — DEXTROSE 5 % IV SOLN
3.0000 g | INTRAVENOUS | Status: DC
  Filled 2014-10-02: qty 3000

## 2014-10-02 MED ORDER — ONDANSETRON HCL 4 MG/2ML IJ SOLN
INTRAMUSCULAR | Status: AC
  Filled 2014-10-02: qty 2

## 2014-10-02 MED ORDER — CEFAZOLIN SODIUM-DEXTROSE 2-3 GM-% IV SOLR
INTRAVENOUS | Status: AC
  Filled 2014-10-02: qty 50

## 2014-10-02 MED ORDER — DIPHENHYDRAMINE HCL 50 MG/ML IJ SOLN
INTRAMUSCULAR | Status: AC
  Filled 2014-10-02: qty 1

## 2014-10-02 MED ORDER — CHLORHEXIDINE GLUCONATE CLOTH 2 % EX PADS
6.0000 | MEDICATED_PAD | Freq: Every day | CUTANEOUS | Status: AC
  Administered 2014-10-02 – 2014-10-06 (×5): 6 via TOPICAL

## 2014-10-02 MED ORDER — PROPOFOL 10 MG/ML IV BOLUS
INTRAVENOUS | Status: AC
  Filled 2014-10-02: qty 20

## 2014-10-02 MED ORDER — DEXAMETHASONE SODIUM PHOSPHATE 4 MG/ML IJ SOLN
INTRAMUSCULAR | Status: AC
  Filled 2014-10-02: qty 1

## 2014-10-02 MED ORDER — LIDOCAINE HCL (CARDIAC) 20 MG/ML IV SOLN
INTRAVENOUS | Status: AC
  Filled 2014-10-02: qty 5

## 2014-10-02 MED ORDER — HYDROMORPHONE HCL 1 MG/ML IJ SOLN: 0.2500 mg | INTRAMUSCULAR | Status: DC | PRN

## 2014-10-02 MED ORDER — INSULIN GLARGINE 100 UNIT/ML ~~LOC~~ SOLN
15.0000 [IU] | Freq: Once | SUBCUTANEOUS | Status: AC
  Administered 2014-10-02: 15 [IU] via SUBCUTANEOUS
  Filled 2014-10-02: qty 0.15

## 2014-10-02 MED ORDER — LIDOCAINE HCL (CARDIAC) 20 MG/ML IV SOLN
INTRAVENOUS | Status: DC | PRN
  Administered 2014-10-02: 60 mg via INTRAVENOUS

## 2014-10-02 MED ORDER — ROCURONIUM BROMIDE 50 MG/5ML IV SOLN
INTRAVENOUS | Status: AC
  Filled 2014-10-02: qty 1

## 2014-10-02 MED ORDER — LACTATED RINGERS IV SOLN
INTRAVENOUS | Status: DC
  Administered 2014-10-02: 12:00:00 via INTRAVENOUS

## 2014-10-02 MED ORDER — CHLORHEXIDINE GLUCONATE 4 % EX LIQD: 60.0000 mL | Freq: Once | CUTANEOUS | Status: DC

## 2014-10-02 MED ORDER — FENTANYL CITRATE (PF) 100 MCG/2ML IJ SOLN
INTRAMUSCULAR | Status: DC | PRN
  Administered 2014-10-02: 50 ug via INTRAVENOUS

## 2014-10-02 MED ORDER — MIDAZOLAM HCL 2 MG/2ML IJ SOLN
INTRAMUSCULAR | Status: AC
  Filled 2014-10-02: qty 2

## 2014-10-02 MED ORDER — FENTANYL CITRATE (PF) 250 MCG/5ML IJ SOLN
INTRAMUSCULAR | Status: AC
  Filled 2014-10-02: qty 5

## 2014-10-02 MED ORDER — PHENYLEPHRINE HCL 10 MG/ML IJ SOLN
INTRAMUSCULAR | Status: DC | PRN
  Administered 2014-10-02: 120 ug via INTRAVENOUS
  Administered 2014-10-02 (×2): 80 ug via INTRAVENOUS

## 2014-10-02 MED ORDER — MIDAZOLAM HCL 5 MG/5ML IJ SOLN
INTRAMUSCULAR | Status: DC | PRN
  Administered 2014-10-02: 2 mg via INTRAVENOUS

## 2014-10-02 MED ORDER — INSULIN STARTER KIT- PEN NEEDLES (ENGLISH)
1.0000 | Freq: Once | Status: DC
  Filled 2014-10-02: qty 1

## 2014-10-02 MED ORDER — PHENYLEPHRINE 40 MCG/ML (10ML) SYRINGE FOR IV PUSH (FOR BLOOD PRESSURE SUPPORT)
PREFILLED_SYRINGE | INTRAVENOUS | Status: AC
  Filled 2014-10-02: qty 10

## 2014-10-02 MED ORDER — MUPIROCIN 2 % EX OINT
1.0000 "application " | TOPICAL_OINTMENT | Freq: Two times a day (BID) | CUTANEOUS | Status: AC
  Administered 2014-10-02 – 2014-10-06 (×10): 1 via NASAL
  Filled 2014-10-02: qty 22

## 2014-10-02 MED ORDER — EPHEDRINE SULFATE 50 MG/ML IJ SOLN
INTRAMUSCULAR | Status: AC
  Filled 2014-10-02: qty 1

## 2014-10-02 MED ORDER — INSULIN GLARGINE 100 UNIT/ML ~~LOC~~ SOLN
24.0000 [IU] | Freq: Every day | SUBCUTANEOUS | Status: DC
  Administered 2014-10-03 – 2014-10-04 (×2): 24 [IU] via SUBCUTANEOUS
  Filled 2014-10-02 (×3): qty 0.24

## 2014-10-02 MED ORDER — CHLORHEXIDINE GLUCONATE 4 % EX LIQD
60.0000 mL | Freq: Once | CUTANEOUS | Status: AC
  Administered 2014-10-02: 4 via TOPICAL
  Filled 2014-10-02: qty 60

## 2014-10-02 MED ORDER — ONDANSETRON HCL 4 MG/2ML IJ SOLN
INTRAMUSCULAR | Status: DC | PRN
  Administered 2014-10-02: 4 mg via INTRAVENOUS

## 2014-10-02 SURGICAL SUPPLY — 38 items
BANDAGE GAUZE 4  KLING STR (GAUZE/BANDAGES/DRESSINGS) ×3 IMPLANT
BLADE AVERAGE 25MMX9MM (BLADE) ×1
BLADE AVERAGE 25X9 (BLADE) ×2 IMPLANT
CANISTER SUCTION 1500CC (MISCELLANEOUS) ×3 IMPLANT
CANISTER WOUND CARE 500ML ATS (WOUND CARE) ×3 IMPLANT
COVER LIGHT HANDLE  DEROYL (MISCELLANEOUS) ×3 IMPLANT
COVER SURGICAL LIGHT HANDLE (MISCELLANEOUS) ×3 IMPLANT
CUFF TOURNIQUET SINGLE 24IN (TOURNIQUET CUFF) ×3 IMPLANT
DRSG EMULSION OIL 3X3 NADH (GAUZE/BANDAGES/DRESSINGS) ×3 IMPLANT
DRSG VAC ATS SM SENSATRAC (GAUZE/BANDAGES/DRESSINGS) ×3 IMPLANT
DURAPREP 26ML APPLICATOR (WOUND CARE) ×3 IMPLANT
ELECT CAUTERY BLADE 6.4 (BLADE) ×3 IMPLANT
ELECT REM PT RETURN 9FT ADLT (ELECTROSURGICAL) ×3
ELECTRODE REM PT RTRN 9FT ADLT (ELECTROSURGICAL) ×1 IMPLANT
FACESHIELD WRAPAROUND (MASK) IMPLANT
GAUZE SPONGE 4X4 12PLY STRL (GAUZE/BANDAGES/DRESSINGS) ×3 IMPLANT
GLOVE NEODERM STRL 7.5 LF PF (GLOVE) ×3 IMPLANT
GLOVE SURG NEODERM 7.5  LF PF (GLOVE) ×6
GOWN STRL REIN XL XLG (GOWN DISPOSABLE) ×3 IMPLANT
HANDPIECE INTERPULSE COAX TIP (DISPOSABLE) ×2
KIT BASIN OR (CUSTOM PROCEDURE TRAY) ×3 IMPLANT
KIT ROOM TURNOVER OR (KITS) ×3 IMPLANT
MANIFOLD NEPTUNE II (INSTRUMENTS) ×3 IMPLANT
NS IRRIG 1000ML POUR BTL (IV SOLUTION) ×3 IMPLANT
PACK ORTHO EXTREMITY (CUSTOM PROCEDURE TRAY) ×3 IMPLANT
PAD ARMBOARD 7.5X6 YLW CONV (MISCELLANEOUS) ×6 IMPLANT
SET HNDPC FAN SPRY TIP SCT (DISPOSABLE) ×1 IMPLANT
SPONGE LAP 18X18 X RAY DECT (DISPOSABLE) ×3 IMPLANT
SUCTION FRAZIER TIP 10 FR DISP (SUCTIONS) ×3 IMPLANT
SUT ETHILON 2 0 PSLX (SUTURE) ×6 IMPLANT
SUT PDS AB 0 CT 36 (SUTURE) IMPLANT
TOWEL OR 17X24 6PK STRL BLUE (TOWEL DISPOSABLE) ×3 IMPLANT
TOWEL OR 17X26 10 PK STRL BLUE (TOWEL DISPOSABLE) ×3 IMPLANT
TUBE CONNECTING 12'X1/4 (SUCTIONS) ×1
TUBE CONNECTING 12X1/4 (SUCTIONS) ×2 IMPLANT
TUBING CYSTO DISP (UROLOGICAL SUPPLIES) ×3 IMPLANT
UNDERPAD 30X30 INCONTINENT (UNDERPADS AND DIAPERS) ×3 IMPLANT
WATER STERILE IRR 1000ML POUR (IV SOLUTION) ×3 IMPLANT

## 2014-10-02 NOTE — Progress Notes (Signed)
Patient's surgery is scheduled for today.  Glucose is in the 400s.  This needs to be below 300 before surgery. Appreciate hospitalist assistance with this.  Azucena Cecil, MD [place] 7:28 AM

## 2014-10-02 NOTE — Anesthesia Preprocedure Evaluation (Addendum)
Anesthesia Evaluation  Patient identified by MRN, date of birth, ID band Patient awake    Reviewed: Allergy & Precautions, H&P , NPO status , Patient's Chart, lab work & pertinent test results  Airway Mallampati: II  TM Distance: >3 FB Neck ROM: Full    Dental no notable dental hx. (+) Teeth Intact, Dental Advisory Given   Pulmonary Current Smoker,  breath sounds clear to auscultation  Pulmonary exam normal       Cardiovascular hypertension, Rhythm:Regular Rate:Normal     Neuro/Psych negative neurological ROS  negative psych ROS   GI/Hepatic negative GI ROS, Neg liver ROS,   Endo/Other  diabetes, Poorly Controlled  Renal/GU negative Renal ROS  negative genitourinary   Musculoskeletal  (+) Arthritis -, Osteoarthritis,    Abdominal   Peds  Hematology negative hematology ROS (+)   Anesthesia Other Findings   Reproductive/Obstetrics negative OB ROS                            Anesthesia Physical Anesthesia Plan  ASA: III  Anesthesia Plan: General   Post-op Pain Management:    Induction: Intravenous  Airway Management Planned: LMA  Additional Equipment:   Intra-op Plan:   Post-operative Plan: Extubation in OR  Informed Consent: I have reviewed the patients History and Physical, chart, labs and discussed the procedure including the risks, benefits and alternatives for the proposed anesthesia with the patient or authorized representative who has indicated his/her understanding and acceptance.   Dental advisory given  Plan Discussed with: CRNA  Anesthesia Plan Comments:        Anesthesia Quick Evaluation

## 2014-10-02 NOTE — Progress Notes (Signed)
Utilization review completed. Faolan [place], RN, BSN. 

## 2014-10-02 NOTE — Anesthesia Postprocedure Evaluation (Signed)
  Anesthesia Post-op Note  Patient: Jeffery [name]  Procedure(s) Performed: Procedure(s) with comments: Left Third toe amputation  (Left) - Regular bed, wants to follow hip APPLICATION OF WOUND VAC (Left)  Patient Location: PACU  Anesthesia Type:General  Level of Consciousness: awake and alert   Airway and Oxygen Therapy: Patient Spontanous Breathing  Post-op Pain: Controlled  Post-op Assessment: Post-op Vital signs reviewed, Patient's Cardiovascular Status Stable and Respiratory Function Stable  Post-op Vital Signs: Reviewed  Filed Vitals:   10/02/14 1420  BP: 144/89  Pulse: 85  Temp:   Resp: 16    Complications: No apparent anesthesia complications

## 2014-10-02 NOTE — Transfer of Care (Signed)
Immediate Anesthesia Transfer of Care Note  Patient: Jeffery [name]  Procedure(s) Performed: Procedure(s) with comments: Left Third toe amputation  (Left) - Regular bed, wants to follow hip APPLICATION OF WOUND VAC (Left)  Patient Location: PACU  Anesthesia Type:General  Level of Consciousness: awake, alert , oriented and sedated  Airway & Oxygen Therapy: Patient Spontanous Breathing and Patient connected to nasal cannula oxygen  Post-op Assessment: Report given to RN, Post -op Vital signs reviewed and stable and Patient moving all extremities  Post vital signs: Reviewed and stable  Last Vitals:  Filed Vitals:   10/02/14 1420  BP: 144/89  Pulse: 85  Temp:   Resp: 16    Complications: No apparent anesthesia complications

## 2014-10-02 NOTE — Op Note (Signed)
   Date of Surgery: 10/02/2014  INDICATIONS: Jeffery [name] is a 54 y.o.-year-old male who has a left third toe osteomyelitis;  The patient did consent to the procedure after discussion of the risks and benefits.  PREOPERATIVE DIAGNOSIS:  1. Left 3rd toe gangrene  POSTOPERATIVE DIAGNOSIS: Same.  PROCEDURE:  1. Left 3rd toe amputation 2. Application of negative wound therapy <50 sq cm  SURGEON: N. Eduard Roux, M.D.  ASSIST: none.  ANESTHESIA:  general  IV FLUIDS AND URINE: See anesthesia.  ESTIMATED BLOOD LOSS: minimal mL.  IMPLANTS: none  DRAINS: 1 wound vac  COMPLICATIONS: None.  DESCRIPTION OF PROCEDURE: The patient was brought to the operating room and placed supine on the operating table.  The patient had been signed prior to the procedure and this was documented. The patient had the anesthesia placed by the anesthesiologist.  A time-out was performed to confirm that this was the correct patient, site, side and location. The patient had an SCD on the opposite lower extremity. The patient did receive antibiotics prior to the incision and was re-dosed during the procedure as needed at indicated intervals.  The patient had the operative extremity prepped and draped in the standard surgical fashion.    A dorsal racquet incision was used.  Full thickness flaps were created.  The toe was amputation at the MTP joint.  There was some superficial purulence tracking towards the 2nd toe.  This was sharply debrided and irrigated with pulse lavage.  The entire wound was irrigated with pulse lavage.  The tourniquet was deflated the wound bed bled.  A wound vac was placed and set to -125 mmHg.  Patient tolerated the procedure well and was extubated the transferred to the PACU in stable condition.    POSTOPERATIVE PLAN: patient will need to return to the OR on Friday for repeat I&D.  The plan is to allow the wound to heal by secondary intention and granulation.  He is to be non weight bearing to left  forefoot.  Jeffery Cecil, MD [place] 1:48 PM

## 2014-10-02 NOTE — Progress Notes (Signed)
Inpatient Diabetes Program Recommendations  AACE/ADA: New Consensus Statement on Inpatient Glycemic Control (2013)  Target Ranges:  Prepandial:   less than 140 mg/dL      Peak postprandial:   less than 180 mg/dL (1-2 hours)      Critically ill patients:  140 - 180 mg/dL   Reason for Assessment: Hyperglycemia  Diabetes history: DM2 Outpatient Diabetes medications: None Current orders for Inpatient glycemic control: Novolog sensitive Q4H.  54 y.o. male who presents with left 3rd toe infection. Noticed worsening in the last 3 days. Denies f/c/n/v. Poorly controlled diabetes. In PACU s/p toe amputation.  Results for BRAETON, WOLGAMOTT (MRN [id number]) as of 10/02/2014 13:49  Ref. Range 10/01/2014 20:23 10/01/2014 23:59 10/02/2014 04:34 10/02/2014 07:58  Glucose-Capillary Latest Ref Range: 65-99 mg/dL 460 (H) 307 (H) 189 (H) 222 (H)  Results for MADISON, ALBEA (MRN [id number]) as of 10/02/2014 13:49  Ref. Range 10/01/2014 11:30  Hemoglobin A1C Latest Ref Range: 4.8-5.6 % 13.9 (H)    Uncontrolled DM. Needs to go home on insulin.  Recommendations: Increase Lantus to 24 units Q24H Increase Novolog to moderate tidwc and hs Will order insulin starter kit as pt will likely go home on insulin. Will discuss diabetes control in am since pt in PACU.  Thank you. Lorenda Peck, RD, LDN, CDE Inpatient Diabetes Coordinator [phone]

## 2014-10-02 NOTE — Anesthesia Procedure Notes (Signed)
Procedure Name: LMA Insertion Performed by: Tamala Fothergill S Intubation Type: IV induction LMA: LMA inserted LMA Size: 5.0 Placement Confirmation: breath sounds checked- equal and bilateral and positive ETCO2 Tube secured with: Tape Dental Injury: Teeth and Oropharynx as per pre-operative assessment

## 2014-10-02 NOTE — Progress Notes (Signed)
[facility] PROGRESS NOTE  Jeffery [name] HKV:[id number] DOB: [birth year] DOA: 10/01/2014 PCP: No PCP Per Patient  Assessment/Plan: #1 left third toe gangrene/osteomyelitis/cellulitis of left lower extremity Plain films of the left third toe with findings consistent with osteomyelitis. Patient is status post left third toe amputation per Dr. Erlinda Hong 1 10/02/2014. Wound VAC in place. Pain management. Patient's was to be nonweightbearing to the left forefoot. Per orthopedics.  #2 poorly controlled diabetes mellitus type 2 Hemoglobin A1c was 13.9. CBGs have ranged from 189-307. Increase Lantus to 24 units daily. Continue sliding scale insulin. Patient will likely need to go home on insulin.  #3 hypertension Continue Norvasc.  #4 prophylaxis DVT prophylaxis per orthopedics.   Code Status: Full Family Communication: Abdomen dated patient. No family present. Disposition Plan: Pending PT evaluation and per orthopedics.   Consultants:  Orthopedics: Dr. Erlinda Hong 10/01/2014  Procedures:  Left third toe amputation per Dr.Xu 10/02/2014  Plain films of the left third toe 10/01/2014  Antibiotics:  IV vancomycin 10/01/2014  IV Zosyn 10/01/2014  HPI/Subjective: Patient states feels better than he did on admission. No complaints.  Objective: Filed Vitals:   10/02/14 1420  BP: 144/89  Pulse: 85  Temp:   Resp: 16    Intake/Output Summary (Last 24 hours) at 10/02/14 1429 Last data filed at 10/02/14 1345  Gross per 24 hour  Intake    340 ml  Output   1575 ml  Net  -1235 ml   Filed Weights   10/01/14 1250  Weight: 120 kg (264 lb 8.8 oz)    Exam:   General:  NAD  Cardiovascular: RRR  Respiratory: CTAB  Abdomen: Soft, nontender, nondistended, positive bowel sounds.  Musculoskeletal: No clubbing or cyanosis. Left lower extremity with 1-2+ edema. Left foot bandage. Wound VAC on left third ray. Status post left third toe amputation.  Data Reviewed: Basic Metabolic  Panel:  Recent Labs Lab 10/01/14 0956 10/01/14 2056 10/02/14 0938  NA 133* 132* 135  K 4.3 4.2 3.7  CL 96* 97* 101  CO2 _0 GLUCOSE 429* 445* 221*  BUN _1 CREATININE 0.69 0.71 0.60*  CALCIUM 9.5 8.6* 8.2*   Liver Function Tests: No results for input(s): AST, ALT, ALKPHOS, BILITOT, PROT, ALBUMIN in the last 168 hours. No results for input(s): LIPASE, AMYLASE in the last 168 hours. No results for input(s): AMMONIA in the last 168 hours. CBC:  Recent Labs Lab 10/01/14 0956 10/02/14 0938  WBC 9.0 7.3  NEUTROABS 6.3  --   HGB 15.3 15.3  HCT 44.4 44.0  MCV 87.4 90.2  PLT 258 212   Cardiac Enzymes: No results for input(s): CKTOTAL, CKMB, CKMBINDEX, TROPONINI in the last 168 hours. BNP (last 3 results) No results for input(s): BNP in the last 8760 hours.  ProBNP (last 3 results) No results for input(s): PROBNP in the last 8760 hours.  CBG:  Recent Labs Lab 10/01/14 1614 10/01/14 2023 10/01/14 2359 10/02/14 0434 10/02/14 0758  GLUCAP 243* 460* 307* 189* 222*    Recent Results (from the past 240 hour(s))  Stat Gram stain     Status: None   Collection Time: 10/01/14 11:40 AM  Result Value Ref Range Status   Specimen Description WOUND  Final   Special Requests Normal  Final   Gram Stain   Final    ABUNDANT WBC PRESENT, PREDOMINANTLY PMN ABUNDANT GRAM POSITIVE COCCI IN CLUSTERS FEW SQUAMOUS EPITHELIAL CELLS PRESENT Gram Stain Report Called to,Read Back By and Verified  With: Vanessa Barbara [id number] @ [id number] BY J SCOTTON    Report Status 10/01/2014 FINAL  Final  Wound culture     Status: None (Preliminary result)   Collection Time: 10/01/14 11:40 AM  Result Value Ref Range Status   Specimen Description TOE LEFT THIRD  Final   Special Requests Normal  Final   Gram Stain   Final    RARE WBC PRESENT, PREDOMINANTLY PMN NO SQUAMOUS EPITHELIAL CELLS SEEN ABUNDANT GRAM POSITIVE COCCI IN PAIRS IN CLUSTERS Performed at [facility]    Culture    Final    Culture reincubated for better growth Performed at [facility]    Report Status PENDING  Incomplete  Surgical pcr screen     Status: Abnormal   Collection Time: 10/02/14  3:02 AM  Result Value Ref Range Status   MRSA, PCR POSITIVE (A) NEGATIVE Final    Comment: RESULT CALLED TO, READ BACK BY AND VERIFIED WITH: Laney Potash [id number] WILDERK/MITCHELLL    Staphylococcus aureus POSITIVE (A) NEGATIVE Final    Comment:        The Xpert SA Assay (FDA approved for NASAL specimens in patients over 27 years of age), is one component of a comprehensive surveillance program.  Test performance has been validated by [facility] for patients greater than or equal to 60 year old. It is not intended to diagnose infection nor to guide or monitor treatment.      Studies: Dg Toe 3rd Left  10/01/2014   CLINICAL DATA:  Injury 1 month ago. Pus pocket on the third toe. Third toe is discolored.  EXAM: LEFT THIRD TOE  COMPARISON:  None.  FINDINGS: There is a displaced fracture of the second toe proximal phalanx with callus formation. Age of this fracture is unknown. There is soft tissue swelling involving the third toe with soft tissue calcifications. There is cortical irregularity and fragmentation of the third toe distal phalanx. Question old fracture of the fifth toe proximal phalanx.  IMPRESSION: Cortical irregularity with bone destruction/fractures of the third toe distal phalanx. Findings are concerning for osteomyelitis. In addition, there is soft tissue swelling with soft tissue calcifications or ossifications in the third toe.  Probable old fractures of the second toe proximal phalanx and fifth toe proximal phalanx.   Electronically Signed   By: Markus Daft M.D.   On: 10/01/2014 10:35    Scheduled Meds: . amLODipine  5 mg Oral Daily  . Chlorhexidine Gluconate Cloth  6 each Topical Q0600  . insulin aspart  0-9 Units Subcutaneous 6 times per day  . insulin glargine  15 Units  Subcutaneous Once  . [START ON 10/03/2014] insulin glargine  24 Units Subcutaneous Daily  . insulin starter kit- pen needles  1 kit Other Once  . mupirocin ointment  1 application Nasal BID  . piperacillin-tazobactam (ZOSYN)  IV  3.375 g Intravenous Q8H  . pneumococcal 23 valent vaccine  0.5 mL Intramuscular Tomorrow-1000  . vancomycin  1,000 mg Intravenous Q12H   Continuous Infusions: . sodium chloride Stopped (10/02/14 0349)  . lactated ringers 10 mL/hr at 10/02/14 1225    Principal Problem:   Toe osteomyelitis, left Active Problems:   Cellulitis of leg, left   Uncontrolled diabetes mellitus with complications   Accelerated hypertension   Gangrene left third toe    Time spent: Farmer MD [facility] Pager [phone]. If 7PM-7AM, please contact night-coverage at [email or web address], password [facility] 10/02/2014, 2:29 PM  LOS: 1  day

## 2014-10-03 ENCOUNTER — Encounter (HOSPITAL_COMMUNITY): Payer: Self-pay | Admitting: Orthopaedic Surgery

## 2014-10-03 DIAGNOSIS — L03116 Cellulitis of left lower limb: Secondary | ICD-10-CM

## 2014-10-03 DIAGNOSIS — M869 Osteomyelitis, unspecified: Principal | ICD-10-CM

## 2014-10-03 DIAGNOSIS — I10 Essential (primary) hypertension: Secondary | ICD-10-CM

## 2014-10-03 LAB — CBC
HCT: 41.9 % (ref 39.0–52.0)
Hemoglobin: 14 g/dL (ref 13.0–17.0)
MCH: 29.8 pg (ref 26.0–34.0)
MCHC: 33.4 g/dL (ref 30.0–36.0)
MCV: 89.1 fL (ref 78.0–100.0)
Platelets: 208 10*3/uL (ref 150–400)
RBC: 4.7 MIL/uL (ref 4.22–5.81)
RDW: 12.6 % (ref 11.5–15.5)
WBC: 7.5 10*3/uL (ref 4.0–10.5)

## 2014-10-03 LAB — BASIC METABOLIC PANEL
Anion gap: 7 (ref 5–15)
BUN: <5 mg/dL — ABNORMAL LOW (ref 6–20)
CO2: 27 mmol/L (ref 22–32)
Calcium: 8.6 mg/dL — ABNORMAL LOW (ref 8.9–10.3)
Chloride: 100 mmol/L — ABNORMAL LOW (ref 101–111)
Creatinine, Ser: 0.63 mg/dL (ref 0.61–1.24)
GFR calc Af Amer: >60 mL/min (ref >60)
GFR calc non Af Amer: >60 mL/min (ref >60)
Glucose, Bld: 243 mg/dL — ABNORMAL HIGH (ref 65–99)
Potassium: 3.7 mmol/L (ref 3.5–5.1)
Sodium: 134 mmol/L — ABNORMAL LOW (ref 135–145)

## 2014-10-03 LAB — GLUCOSE, CAPILLARY
Glucose-Capillary: 265 mg/dL — ABNORMAL HIGH (ref 65–99)
Glucose-Capillary: 240 mg/dL — ABNORMAL HIGH (ref 65–99)
Glucose-Capillary: 244 mg/dL — ABNORMAL HIGH (ref 65–99)
Glucose-Capillary: 333 mg/dL — ABNORMAL HIGH (ref 65–99)

## 2014-10-03 MED ORDER — HEPARIN SODIUM (PORCINE) 5000 UNIT/ML IJ SOLN
5000.0000 [IU] | Freq: Three times a day (TID) | INTRAMUSCULAR | Status: AC
  Administered 2014-10-03 – 2014-10-04 (×4): 5000 [IU] via SUBCUTANEOUS
  Filled 2014-10-03 (×4): qty 1

## 2014-10-03 MED ORDER — POLYETHYLENE GLYCOL 3350 17 G PO PACK
17.0000 g | PACK | Freq: Two times a day (BID) | ORAL | Status: DC
  Administered 2014-10-03 – 2014-10-07 (×8): 17 g via ORAL
  Filled 2014-10-03 (×8): qty 1

## 2014-10-03 NOTE — Progress Notes (Signed)
[facility] PROGRESS NOTE  Jeffery [name] MOL:[id number] DOB: 28-Nov-1960 DOA: 10/01/2014 PCP: No PCP Per Patient  Assessment/Plan: 1 left third toe gangrene/osteomyelitis/cellulitis of left lower extremity -Plain films of the left third toe with findings consistent with osteomyelitis.  -Patient is status post left third toe amputation per Dr. Erlinda Hong 1 10/02/2014. Wound VAC in place. Pain management.  -Plan for OR again on Friday.  -Follow up culture: growing staph Aureus, follow sensitivity.  -continue with IV vancomycin and Zosyn day 3.  -Blood culture no growth to date.   2 poorly controlled diabetes mellitus type 2 Hemoglobin A1c was 13.9.  Continue with Lantus to 24 units daily. Continue sliding scale insulin. Patient will likely need to go home on insulin. He will need assistance with medications. CM consulted.   3 hypertension Continue Norvasc.  4 prophylaxis DVT prophylaxis per orthopedics. heparin  5-Constipation: start miralax.   Code Status: Full Family Communication:  Disposition Plan: Pending PT evaluation and per orthopedics. Plan for OR again on friday   Consultants:  Orthopedics: Dr. Erlinda Hong 10/01/2014  Procedures:  Left third toe amputation per Dr.Xu 10/02/2014  Plain films of the left third toe 10/01/2014  Antibiotics:  IV vancomycin 10/01/2014  IV Zosyn 10/01/2014  HPI/Subjective: No abdominal pain. No BM since admission.   Objective: Filed Vitals:   10/03/14 0547  BP: 133/97  Pulse: 79  Temp: 98.1 F (36.7 C)  Resp: 18    Intake/Output Summary (Last 24 hours) at 10/03/14 1128 Last data filed at 10/03/14 1052  Gross per 24 hour  Intake   2350 ml  Output   4150 ml  Net  -1800 ml   Filed Weights   10/01/14 1250  Weight: 120 kg (264 lb 8.8 oz)    Exam:   General:  NAD  Cardiovascular: RRR  Respiratory: CTAB  Abdomen: Soft, nontender, nondistended, positive bowel sounds.  Musculoskeletal: No clubbing or cyanosis. Left  lower extremity with 1-2+ edema. Left foot bandage. Wound VAC on left third ray. Status post left third toe amputation.  Data Reviewed: Basic Metabolic Panel:  Recent Labs Lab 10/01/14 0956 10/01/14 2056 10/02/14 0938 10/03/14 0612  NA 133* 132* 135 134*  K 4.3 4.2 3.7 3.7  CL 96* 97* 101 100*  CO2 _0 GLUCOSE 429* 445* 221* 243*  BUN _1 <5*  CREATININE 0.69 0.71 0.60* 0.63  CALCIUM 9.5 8.6* 8.2* 8.6*   Liver Function Tests: No results for input(s): AST, ALT, ALKPHOS, BILITOT, PROT, ALBUMIN in the last 168 hours. No results for input(s): LIPASE, AMYLASE in the last 168 hours. No results for input(s): AMMONIA in the last 168 hours. CBC:  Recent Labs Lab 10/01/14 0956 10/02/14 0938 10/03/14 0612  WBC 9.0 7.3 7.5  NEUTROABS 6.3  --   --   HGB 15.3 15.3 14.0  HCT 44.4 44.0 41.9  MCV 87.4 90.2 89.1  PLT 258 212 208   Cardiac Enzymes: No results for input(s): CKTOTAL, CKMB, CKMBINDEX, TROPONINI in the last 168 hours. BNP (last 3 results) No results for input(s): BNP in the last 8760 hours.  ProBNP (last 3 results) No results for input(s): PROBNP in the last 8760 hours.  CBG:  Recent Labs Lab 10/02/14 1137 10/02/14 1631 10/02/14 2004 10/02/14 2354 10/03/14 0417  GLUCAP 228* 392* 337* 223* 265*    Recent Results (from the past 240 hour(s))  Culture, blood (routine x 2)     Status: None (Preliminary result)   Collection Time:  10/01/14 11:35 AM  Result Value Ref Range Status   Specimen Description BLOOD RIGHT HAND  5 ML IN [facility] BOTTLE  Final   Special Requests Normal  Final   Culture   Final    NO GROWTH < 24 HOURS Performed at [facility]    Report Status PENDING  Incomplete  Stat Gram stain     Status: None   Collection Time: 10/01/14 11:40 AM  Result Value Ref Range Status   Specimen Description WOUND  Final   Special Requests Normal  Final   Gram Stain   Final    ABUNDANT WBC PRESENT, PREDOMINANTLY PMN ABUNDANT GRAM POSITIVE  COCCI IN CLUSTERS FEW SQUAMOUS EPITHELIAL CELLS PRESENT Gram Stain Report Called to,Read Back By and Verified With: Vanessa Barbara [id number] @ [address]    Report Status 10/01/2014 FINAL  Final  Wound culture     Status: None (Preliminary result)   Collection Time: 10/01/14 11:40 AM  Result Value Ref Range Status   Specimen Description TOE LEFT THIRD  Final   Special Requests Normal  Final   Gram Stain   Final    RARE WBC PRESENT, PREDOMINANTLY PMN NO SQUAMOUS EPITHELIAL CELLS SEEN ABUNDANT GRAM POSITIVE COCCI IN PAIRS IN CLUSTERS Performed at [facility]    Culture   Final    ABUNDANT STAPHYLOCOCCUS AUREUS Note: RIFAMPIN AND GENTAMICIN SHOULD NOT BE USED AS SINGLE DRUGS FOR TREATMENT OF STAPH INFECTIONS. Performed at [facility]    Report Status PENDING  Incomplete  Culture, blood (routine x 2)     Status: None (Preliminary result)   Collection Time: 10/01/14 11:41 AM  Result Value Ref Range Status   Specimen Description BLOOD LEFT ARM  5 ML IN [facility] BOTTLE  Final   Special Requests Normal  Final   Culture   Final    NO GROWTH < 24 HOURS Performed at [facility]    Report Status PENDING  Incomplete  Surgical pcr screen     Status: Abnormal   Collection Time: 10/02/14  3:02 AM  Result Value Ref Range Status   MRSA, PCR POSITIVE (A) NEGATIVE Final    Comment: RESULT CALLED TO, READ BACK BY AND VERIFIED WITH: Laney Potash [id number] WILDERK/MITCHELLL    Staphylococcus aureus POSITIVE (A) NEGATIVE Final    Comment:        The Xpert SA Assay (FDA approved for NASAL specimens in patients over 62 years of age), is one component of a comprehensive surveillance program.  Test performance has been validated by [facility] for patients greater than or equal to 35 year old. It is not intended to diagnose infection nor to guide or monitor treatment.      Studies: No results found.  Scheduled Meds: . amLODipine  5 mg Oral Daily  .  Chlorhexidine Gluconate Cloth  6 each Topical Q0600  . insulin aspart  0-9 Units Subcutaneous 6 times per day  . insulin glargine  24 Units Subcutaneous Daily  . insulin starter kit- pen needles  1 kit Other Once  . mupirocin ointment  1 application Nasal BID  . piperacillin-tazobactam (ZOSYN)  IV  3.375 g Intravenous Q8H  . pneumococcal 23 valent vaccine  0.5 mL Intramuscular Tomorrow-1000  . vancomycin  1,000 mg Intravenous Q12H   Continuous Infusions: . sodium chloride Stopped (10/02/14 0349)  . lactated ringers 10 mL/hr at 10/02/14 1225    Principal Problem:   Toe osteomyelitis, left Active Problems:  Cellulitis of leg, left   Uncontrolled diabetes mellitus with complications   Accelerated hypertension   Gangrene left third toe   Cellulitis of left lower extremity   Osteomyelitis of toe of left foot    Time spent: [place], Glorious Flicker A MD [facility] Pager [phone]. If 7PM-7AM, please contact night-coverage at [email or web address], password [facility] 10/03/2014, 11:28 AM  LOS: 2 days

## 2014-10-04 LAB — CBC
HCT: 42.9 % (ref 39.0–52.0)
Hemoglobin: 14.6 g/dL (ref 13.0–17.0)
MCH: 30.3 pg (ref 26.0–34.0)
MCHC: 34 g/dL (ref 30.0–36.0)
MCV: 89 fL (ref 78.0–100.0)
Platelets: 223 10*3/uL (ref 150–400)
RBC: 4.82 MIL/uL (ref 4.22–5.81)
RDW: 12.6 % (ref 11.5–15.5)
WBC: 7.8 10*3/uL (ref 4.0–10.5)

## 2014-10-04 LAB — WOUND CULTURE: Special Requests: NORMAL

## 2014-10-04 LAB — BASIC METABOLIC PANEL
Anion gap: 8 (ref 5–15)
BUN: <5 mg/dL — ABNORMAL LOW (ref 6–20)
CO2: 25 mmol/L (ref 22–32)
Calcium: 8.5 mg/dL — ABNORMAL LOW (ref 8.9–10.3)
Chloride: 101 mmol/L (ref 101–111)
Creatinine, Ser: 0.59 mg/dL — ABNORMAL LOW (ref 0.61–1.24)
GFR calc Af Amer: >60 mL/min (ref >60)
GFR calc non Af Amer: >60 mL/min (ref >60)
Glucose, Bld: 240 mg/dL — ABNORMAL HIGH (ref 65–99)
Potassium: 4 mmol/L (ref 3.5–5.1)
Sodium: 134 mmol/L — ABNORMAL LOW (ref 135–145)

## 2014-10-04 LAB — GLUCOSE, CAPILLARY
Glucose-Capillary: 235 mg/dL — ABNORMAL HIGH (ref 65–99)
Glucose-Capillary: 244 mg/dL — ABNORMAL HIGH (ref 65–99)
Glucose-Capillary: 278 mg/dL — ABNORMAL HIGH (ref 65–99)
Glucose-Capillary: 315 mg/dL — ABNORMAL HIGH (ref 65–99)
Glucose-Capillary: 327 mg/dL — ABNORMAL HIGH (ref 65–99)
Glucose-Capillary: 364 mg/dL — ABNORMAL HIGH (ref 65–99)

## 2014-10-04 MED ORDER — DEXTROSE 5 % IV SOLN
3.0000 g | INTRAVENOUS | Status: AC
  Administered 2014-10-05: 3 g via INTRAVENOUS
  Filled 2014-10-04 (×2): qty 3000

## 2014-10-04 MED ORDER — INSULIN ASPART 100 UNIT/ML ~~LOC~~ SOLN
0.0000 [IU] | Freq: Three times a day (TID) | SUBCUTANEOUS | Status: DC
  Administered 2014-10-04: 3 [IU] via SUBCUTANEOUS
  Administered 2014-10-04 – 2014-10-05 (×2): 9 [IU] via SUBCUTANEOUS
  Administered 2014-10-05: 3 [IU] via SUBCUTANEOUS
  Administered 2014-10-06: 7 [IU] via SUBCUTANEOUS
  Administered 2014-10-06: 3 [IU] via SUBCUTANEOUS
  Administered 2014-10-06: 2 [IU] via SUBCUTANEOUS
  Administered 2014-10-07: 3 [IU] via SUBCUTANEOUS
  Administered 2014-10-07: 6 [IU] via SUBCUTANEOUS
  Administered 2014-10-07: 5 [IU] via SUBCUTANEOUS

## 2014-10-04 MED ORDER — INSULIN ASPART 100 UNIT/ML ~~LOC~~ SOLN
4.0000 [IU] | Freq: Three times a day (TID) | SUBCUTANEOUS | Status: DC
  Administered 2014-10-04: 4 [IU] via SUBCUTANEOUS

## 2014-10-04 NOTE — Progress Notes (Signed)
ANTIBIOTIC CONSULT NOTE  Pharmacy Consult for vancomycin/zosyn Indication: osteomyelitis/wound infection  No Known Allergies  Patient Measurements: Height: 6\' 2"  (188 cm) Weight: 264 lb 8.8 oz (120 kg) IBW/kg (Calculated) : 82.2 Adjusted Body Weight:   Vital Signs: Temp: 97.7 F (36.5 C) (07/07 0551) BP: 147/69 mmHg (07/07 1124) Pulse Rate: 87 (07/07 0551) Intake/Output from previous day: 07/06 0701 - 07/07 0700 In: 2770 [P.O.:1320; I.V.:1200; IV Piggyback:250] Out: [id number] [Urine:5125; Drains:50] Intake/Output from this shift:    Labs:  Recent Labs  10/02/14 0938 10/03/14 0612 10/04/14 0339  WBC 7.3 7.5 7.8  HGB 15.3 14.0 14.6  PLT 212 208 223  CREATININE 0.60* 0.63 0.59*   Estimated Creatinine Clearance: 148.7 mL/min (by C-G formula based on Cr of 0.59). No results for input(s): VANCOTROUGH, VANCOPEAK, VANCORANDOM, GENTTROUGH, GENTPEAK, GENTRANDOM, TOBRATROUGH, TOBRAPEAK, TOBRARND, AMIKACINPEAK, AMIKACINTROU, AMIKACIN in the last 72 hours.   Microbiology: Recent Results (from the past 720 hour(s))  Culture, blood (routine x 2)     Status: None (Preliminary result)   Collection Time: 10/01/14 11:35 AM  Result Value Ref Range Status   Specimen Description BLOOD RIGHT HAND  5 ML IN [facility] BOTTLE  Final   Special Requests Normal  Final   Culture   Final    NO GROWTH 3 DAYS Performed at [facility]    Report Status PENDING  Incomplete  Stat Gram stain     Status: None   Collection Time: 10/01/14 11:40 AM  Result Value Ref Range Status   Specimen Description WOUND  Final   Special Requests Normal  Final   Gram Stain   Final    ABUNDANT WBC PRESENT, PREDOMINANTLY PMN ABUNDANT GRAM POSITIVE COCCI IN CLUSTERS FEW SQUAMOUS EPITHELIAL CELLS PRESENT Gram Stain Report Called to,Read Back By and Verified With: Vanessa Barbara [id number] @ [id number] [place]    Report Status 10/01/2014 FINAL  Final  Wound culture     Status: None   Collection Time: 10/01/14 11:40 AM   Result Value Ref Range Status   Specimen Description TOE LEFT THIRD  Final   Special Requests Normal  Final   Gram Stain   Final    RARE WBC PRESENT, PREDOMINANTLY PMN NO SQUAMOUS EPITHELIAL CELLS SEEN ABUNDANT GRAM POSITIVE COCCI IN PAIRS IN CLUSTERS Performed at [facility]    Culture   Final    ABUNDANT STAPHYLOCOCCUS AUREUS Note: RIFAMPIN AND GENTAMICIN SHOULD NOT BE USED AS SINGLE DRUGS FOR TREATMENT OF STAPH INFECTIONS. Performed at [facility]    Report Status 10/04/2014 FINAL  Final   Organism ID, Bacteria STAPHYLOCOCCUS AUREUS  Final      Susceptibility   Staphylococcus aureus - MIC*    CLINDAMYCIN <=0.25 SENSITIVE Sensitive     ERYTHROMYCIN 0.5 SENSITIVE Sensitive     GENTAMICIN <=0.5 SENSITIVE Sensitive     LEVOFLOXACIN 0.25 SENSITIVE Sensitive     OXACILLIN 0.5 SENSITIVE Sensitive     PENICILLIN >=0.5 RESISTANT Resistant     RIFAMPIN <=0.5 SENSITIVE Sensitive     TRIMETH/SULFA <=10 SENSITIVE Sensitive     VANCOMYCIN <=0.5 SENSITIVE Sensitive     TETRACYCLINE <=1 SENSITIVE Sensitive     MOXIFLOXACIN <=0.25 SENSITIVE Sensitive     * ABUNDANT STAPHYLOCOCCUS AUREUS  Culture, blood (routine x 2)     Status: None (Preliminary result)   Collection Time: 10/01/14 11:41 AM  Result Value Ref Range Status   Specimen Description BLOOD LEFT ARM  5 ML IN [facility] BOTTLE  Final  Special Requests Normal  Final   Culture   Final    NO GROWTH 3 DAYS Performed at [facility]    Report Status PENDING  Incomplete  Surgical pcr screen     Status: Abnormal   Collection Time: 10/02/14  3:02 AM  Result Value Ref Range Status   MRSA, PCR POSITIVE (A) NEGATIVE Final    Comment: RESULT CALLED TO, READ BACK BY AND VERIFIED WITH: Laney Potash [id number] WILDERK/MITCHELLL    Staphylococcus aureus POSITIVE (A) NEGATIVE Final    Comment:        The Xpert SA Assay (FDA approved for NASAL specimens in patients over 61 years of age), is one component of a  comprehensive surveillance program.  Test performance has been validated by [facility] for patients greater than or equal to 53 year old. It is not intended to diagnose infection nor to guide or monitor treatment.    Assessment: 54 yo M admitted with osteomyelitis of left middle toe with cellulitis. Now s/p amputation on 7/5 with plans to return to OR for I&D tomorrow.  Wound culture grew out abundant MSSA and no other organisms. Spoke with Dr. Tyrell Antonio this morning and she is concerned there is a chance he has anaerobes d/t DM and does not wish to change antibiotics at this point. SCr remains stable. WBC nml and patient afebrile.  Note patient has elevated CBGs- also spoke with Dr. Tyrell Antonio regarding DM coordinator recommendations to increase Lantus. States this will be done when patient has diet (will be NPO for OR after midnight). Elevated CBGs impair wound healing.  Goal of Therapy:  Vancomycin trough level 15-20 mcg/ml  Plan:  -vancomycin 1g IV q12h -Zosyn 3.375g IV q8h EI -follow up post-OR tomorrow for ability to stop antibiotics -follow renal function, clinical progression  Zymiere Trostle D. Shateka Petrea, PharmD, BCPS Clinical Pharmacist Pager: [phone] 10/04/2014 1:42 PM

## 2014-10-04 NOTE — H&P (Signed)

## 2014-10-04 NOTE — Progress Notes (Signed)
VAC with good seal and suction Needs better glucose control Surgery tomorrow for repeat I&D NPO after midnight  N. Eduard Roux, MD [place] 7:37 AM

## 2014-10-04 NOTE — Progress Notes (Signed)
Inpatient Diabetes Program Recommendations  AACE/ADA: New Consensus Statement on Inpatient Glycemic Control (2013)  Target Ranges:  Prepandial:   less than 140 mg/dL      Peak postprandial:   less than 180 mg/dL (1-2 hours)      Critically ill patients:  140 - 180 mg/dL   Reason for Visit: Hyperglycemia  Diabetes history: DM2 Outpatient Diabetes medications: None Current orders for Inpatient glycemic control: Lantus 24 units QHS, Novolog 4 units tidwc, Novolog sensitive tidwc  Results for Jeffery [name], Jeffery [name] (MRN [id number]) as of 10/04/2014 12:31  Ref. Range 10/03/2014 20:34 10/04/2014 00:33 10/04/2014 03:57 10/04/2014 07:45 10/04/2014 11:30  Glucose-Capillary Latest Ref Range: 65-99 mg/dL 333 (H) 315 (H) 244 (H) 278 (H) 235 (H)    Inpatient Diabetes Program Recommendations Insulin - Basal: Increase Lantus to 30 units QHS Insulin - Meal Coverage: Increase Novolog to 6 units tidwc  Note: Long discussion with pt regarding diabetes control. Discussed diet, monitoring and importance of f/u with PCP. Will order the syringe insulin starter kit and RN to begin teaching insulin administration. To view diabetes videos on pt ed channel. Will continue to reinforce importance of tighter glucose control. Pt states he will definitely need financial assistance with insulin, meter and supplies.  Will continue to follow. Thank you. Rhonda Vaughan, RD, LDN, CDE Inpatient Diabetes Coordinator [phone]    

## 2014-10-05 ENCOUNTER — Inpatient Hospital Stay (HOSPITAL_COMMUNITY): Payer: MEDICAID | Admitting: Anesthesiology

## 2014-10-05 ENCOUNTER — Encounter (HOSPITAL_COMMUNITY)
Admission: EM | Disposition: A | Discharge status: Home Health | Payer: Self-pay | Source: Home / Self Care | Attending: Internal Medicine

## 2014-10-05 ENCOUNTER — Inpatient Hospital Stay (HOSPITAL_COMMUNITY): Payer: Self-pay | Admitting: Anesthesiology

## 2014-10-05 ENCOUNTER — Inpatient Hospital Stay (HOSPITAL_COMMUNITY): Payer: Self-pay

## 2014-10-05 HISTORY — PX: I & D EXTREMITY: SHX5045

## 2014-10-05 LAB — GLUCOSE, CAPILLARY
Glucose-Capillary: 230 mg/dL — ABNORMAL HIGH (ref 65–99)
Glucose-Capillary: 192 mg/dL — ABNORMAL HIGH (ref 65–99)
Glucose-Capillary: 226 mg/dL — ABNORMAL HIGH (ref 65–99)
Glucose-Capillary: 364 mg/dL — ABNORMAL HIGH (ref 65–99)
Glucose-Capillary: 312 mg/dL — ABNORMAL HIGH (ref 65–99)

## 2014-10-05 SURGERY — IRRIGATION AND DEBRIDEMENT EXTREMITY
Anesthesia: General | Site: Foot | Laterality: Left

## 2014-10-05 MED ORDER — LACTATED RINGERS IV SOLN
INTRAVENOUS | Status: DC | PRN
  Administered 2014-10-05 (×2): via INTRAVENOUS

## 2014-10-05 MED ORDER — PROPOFOL 10 MG/ML IV BOLUS
INTRAVENOUS | Status: DC | PRN
  Administered 2014-10-05: 200 mg via INTRAVENOUS

## 2014-10-05 MED ORDER — PROPOFOL 10 MG/ML IV BOLUS
INTRAVENOUS | Status: AC
  Filled 2014-10-05: qty 20

## 2014-10-05 MED ORDER — LACTATED RINGERS IV SOLN
INTRAVENOUS | Status: DC
  Administered 2014-10-05: 50 mL/h via INTRAVENOUS

## 2014-10-05 MED ORDER — FENTANYL CITRATE (PF) 250 MCG/5ML IJ SOLN
INTRAMUSCULAR | Status: AC
  Filled 2014-10-05: qty 5

## 2014-10-05 MED ORDER — ONDANSETRON HCL 4 MG/2ML IJ SOLN
INTRAMUSCULAR | Status: DC | PRN
  Administered 2014-10-05: 4 mg via INTRAVENOUS

## 2014-10-05 MED ORDER — MIDAZOLAM HCL 2 MG/2ML IJ SOLN
INTRAMUSCULAR | Status: AC
  Filled 2014-10-05: qty 2

## 2014-10-05 MED ORDER — HYDROMORPHONE HCL 1 MG/ML IJ SOLN
0.2500 mg | INTRAMUSCULAR | Status: DC | PRN
  Administered 2014-10-05 (×2): 0.5 mg via INTRAVENOUS

## 2014-10-05 MED ORDER — HYDROMORPHONE HCL 1 MG/ML IJ SOLN
INTRAMUSCULAR | Status: AC
  Filled 2014-10-05: qty 1

## 2014-10-05 MED ORDER — DICLOFENAC SODIUM 1 % TD GEL
2.0000 g | Freq: Four times a day (QID) | TRANSDERMAL | Status: DC
  Administered 2014-10-05 – 2014-10-06 (×3): 2 g via TOPICAL
  Filled 2014-10-05 (×2): qty 100

## 2014-10-05 MED ORDER — INSULIN GLARGINE 100 UNIT/ML ~~LOC~~ SOLN
24.0000 [IU] | Freq: Every day | SUBCUTANEOUS | Status: DC
  Filled 2014-10-05: qty 0.24

## 2014-10-05 MED ORDER — MIDAZOLAM HCL 5 MG/5ML IJ SOLN
INTRAMUSCULAR | Status: DC | PRN
  Administered 2014-10-05: 2 mg via INTRAVENOUS

## 2014-10-05 MED ORDER — CHLORHEXIDINE GLUCONATE 4 % EX LIQD
60.0000 mL | Freq: Once | CUTANEOUS | Status: AC
  Administered 2014-10-05: 4 via TOPICAL

## 2014-10-05 MED ORDER — SODIUM CHLORIDE 0.9 % IR SOLN
Status: DC | PRN
  Administered 2014-10-05: 1000 mL

## 2014-10-05 MED ORDER — SODIUM CHLORIDE 0.9 % IV SOLN
INTRAVENOUS | Status: DC
  Administered 2014-10-05: 07:00:00 via INTRAVENOUS

## 2014-10-05 MED ORDER — SODIUM CHLORIDE 0.9 % IR SOLN
Status: DC | PRN
  Administered 2014-10-05: 3000 mL

## 2014-10-05 MED ORDER — INSULIN ASPART 100 UNIT/ML ~~LOC~~ SOLN
6.0000 [IU] | Freq: Three times a day (TID) | SUBCUTANEOUS | Status: DC
  Administered 2014-10-05 – 2014-10-07 (×7): 6 [IU] via SUBCUTANEOUS

## 2014-10-05 MED ORDER — PHENYLEPHRINE HCL 10 MG/ML IJ SOLN
INTRAMUSCULAR | Status: DC | PRN
  Administered 2014-10-05 (×4): 80 ug via INTRAVENOUS

## 2014-10-05 MED ORDER — PROMETHAZINE HCL 25 MG/ML IJ SOLN: 6.2500 mg | INTRAMUSCULAR | Status: DC | PRN

## 2014-10-05 MED ORDER — MEPERIDINE HCL 25 MG/ML IJ SOLN: 6.2500 mg | INTRAMUSCULAR | Status: DC | PRN

## 2014-10-05 MED ORDER — LIDOCAINE HCL (CARDIAC) 20 MG/ML IV SOLN
INTRAVENOUS | Status: DC | PRN
  Administered 2014-10-05: 50 mg via INTRAVENOUS

## 2014-10-05 MED ORDER — EPHEDRINE SULFATE 50 MG/ML IJ SOLN
INTRAMUSCULAR | Status: DC | PRN
  Administered 2014-10-05: 10 mg via INTRAVENOUS

## 2014-10-05 MED ORDER — INSULIN STARTER KIT- SYRINGES (ENGLISH)
1.0000 | Freq: Once | Status: AC
  Administered 2014-10-05: 1
  Filled 2014-10-05: qty 1

## 2014-10-05 MED ORDER — FENTANYL CITRATE (PF) 100 MCG/2ML IJ SOLN
INTRAMUSCULAR | Status: DC | PRN
  Administered 2014-10-05 (×2): 50 ug via INTRAVENOUS
  Administered 2014-10-05: 100 ug via INTRAVENOUS
  Administered 2014-10-05: 50 ug via INTRAVENOUS

## 2014-10-05 MED ORDER — CEPHALEXIN 500 MG PO CAPS
500.0000 mg | ORAL_CAPSULE | Freq: Four times a day (QID) | ORAL | Status: DC
  Administered 2014-10-05 – 2014-10-07 (×9): 500 mg via ORAL
  Filled 2014-10-05 (×10): qty 1

## 2014-10-05 MED ORDER — INSULIN GLARGINE 100 UNIT/ML ~~LOC~~ SOLN: 28.0000 [IU] | Freq: Every day | SUBCUTANEOUS | Status: DC

## 2014-10-05 SURGICAL SUPPLY — 68 items
BANDAGE ELASTIC 3 VELCRO ST LF (GAUZE/BANDAGES/DRESSINGS) IMPLANT
BLADE SURG 10 STRL SS (BLADE) ×3 IMPLANT
BNDG COHESIVE 1X5 TAN STRL LF (GAUZE/BANDAGES/DRESSINGS) IMPLANT
BNDG COHESIVE 4X5 TAN STRL (GAUZE/BANDAGES/DRESSINGS) IMPLANT
BNDG COHESIVE 6X5 TAN STRL LF (GAUZE/BANDAGES/DRESSINGS) IMPLANT
BNDG CONFORM 3 STRL LF (GAUZE/BANDAGES/DRESSINGS) IMPLANT
BNDG GAUZE STRTCH 6 (GAUZE/BANDAGES/DRESSINGS) IMPLANT
CANISTER WOUND CARE 500ML ATS (WOUND CARE) ×3 IMPLANT
CORDS BIPOLAR (ELECTRODE) IMPLANT
COVER SURGICAL LIGHT HANDLE (MISCELLANEOUS) ×3 IMPLANT
CUFF TOURNIQUET SINGLE 24IN (TOURNIQUET CUFF) IMPLANT
CUFF TOURNIQUET SINGLE 34IN LL (TOURNIQUET CUFF) IMPLANT
CUFF TOURNIQUET SINGLE 44IN (TOURNIQUET CUFF) IMPLANT
DRAPE EXTREMITY BILATERAL (DRAPE) IMPLANT
DRAPE IMP U-DRAPE 54X76 (DRAPES) IMPLANT
DRAPE INCISE IOBAN 66X45 STRL (DRAPES) ×6 IMPLANT
DRAPE SURG 17X23 STRL (DRAPES) IMPLANT
DRAPE U-SHAPE 47X51 STRL (DRAPES) ×3 IMPLANT
DRSG MEPITEL 4X7.2 (GAUZE/BANDAGES/DRESSINGS) ×3 IMPLANT
DRSG VAC ATS SM SENSATRAC (GAUZE/BANDAGES/DRESSINGS) ×3 IMPLANT
DURAPREP 26ML APPLICATOR (WOUND CARE) ×3 IMPLANT
ELECT CAUTERY BLADE 6.4 (BLADE) ×3 IMPLANT
ELECT REM PT RETURN 9FT ADLT (ELECTROSURGICAL)
ELECTRODE REM PT RTRN 9FT ADLT (ELECTROSURGICAL) IMPLANT
FACESHIELD WRAPAROUND (MASK) IMPLANT
GAUZE SPONGE 4X4 12PLY STRL (GAUZE/BANDAGES/DRESSINGS) IMPLANT
GAUZE XEROFORM 1X8 LF (GAUZE/BANDAGES/DRESSINGS) ×3 IMPLANT
GAUZE XEROFORM 5X9 LF (GAUZE/BANDAGES/DRESSINGS) ×3 IMPLANT
GLOVE BIO SURGEON STRL SZ 6.5 (GLOVE) ×2 IMPLANT
GLOVE BIO SURGEON STRL SZ7 (GLOVE) ×3 IMPLANT
GLOVE BIO SURGEONS STRL SZ 6.5 (GLOVE) ×1
GLOVE BIOGEL PI IND STRL 6.5 (GLOVE) ×2 IMPLANT
GLOVE BIOGEL PI IND STRL 7.0 (GLOVE) ×1 IMPLANT
GLOVE BIOGEL PI INDICATOR 6.5 (GLOVE) ×4
GLOVE BIOGEL PI INDICATOR 7.0 (GLOVE) ×2
GLOVE NEODERM STRL 7.5 LF PF (GLOVE) ×1 IMPLANT
GLOVE SURG NEODERM 7.5  LF PF (GLOVE) ×2
GOWN STRL REIN XL XLG (GOWN DISPOSABLE) ×3 IMPLANT
HANDPIECE INTERPULSE COAX TIP (DISPOSABLE) ×2
KIT BASIN OR (CUSTOM PROCEDURE TRAY) ×3 IMPLANT
KIT ROOM TURNOVER OR (KITS) ×3 IMPLANT
MANIFOLD NEPTUNE II (INSTRUMENTS) ×3 IMPLANT
NS IRRIG 1000ML POUR BTL (IV SOLUTION) ×6 IMPLANT
PACK ORTHO EXTREMITY (CUSTOM PROCEDURE TRAY) ×3 IMPLANT
PAD ABD 8X10 STRL (GAUZE/BANDAGES/DRESSINGS) ×3 IMPLANT
PAD ARMBOARD 7.5X6 YLW CONV (MISCELLANEOUS) ×6 IMPLANT
PADDING CAST ABS 4INX4YD NS (CAST SUPPLIES) ×4
PADDING CAST ABS COTTON 4X4 ST (CAST SUPPLIES) ×2 IMPLANT
PADDING CAST COTTON 6X4 STRL (CAST SUPPLIES) ×3 IMPLANT
SET HNDPC FAN SPRY TIP SCT (DISPOSABLE) ×1 IMPLANT
SPONGE LAP 18X18 X RAY DECT (DISPOSABLE) ×3 IMPLANT
STOCKINETTE IMPERVIOUS 9X36 MD (GAUZE/BANDAGES/DRESSINGS) ×3 IMPLANT
SUT ETHILON 2 0 FS 18 (SUTURE) IMPLANT
SUT ETHILON 2 0 PSLX (SUTURE) IMPLANT
SUT ETHILON 3 0 PS 1 (SUTURE) ×3 IMPLANT
SUT VIC AB 2-0 CT1 36 (SUTURE) IMPLANT
SUT VIC AB 2-0 FS1 27 (SUTURE) IMPLANT
SYR CONTROL 10ML LL (SYRINGE) IMPLANT
TOWEL OR 17X24 6PK STRL BLUE (TOWEL DISPOSABLE) ×3 IMPLANT
TOWEL OR 17X26 10 PK STRL BLUE (TOWEL DISPOSABLE) ×3 IMPLANT
TUBE ANAEROBIC SPECIMEN COL (MISCELLANEOUS) IMPLANT
TUBE CONNECTING 12'X1/4 (SUCTIONS) ×1
TUBE CONNECTING 12X1/4 (SUCTIONS) ×2 IMPLANT
TUBE FEEDING 5FR 15 INCH (TUBING) IMPLANT
TUBING CYSTO DISP (UROLOGICAL SUPPLIES) IMPLANT
UNDERPAD 30X30 INCONTINENT (UNDERPADS AND DIAPERS) ×3 IMPLANT
WATER STERILE IRR 1000ML POUR (IV SOLUTION) ×3 IMPLANT
YANKAUER SUCT BULB TIP NO VENT (SUCTIONS) ×3 IMPLANT

## 2014-10-05 NOTE — Transfer of Care (Signed)
Immediate Anesthesia Transfer of Care Note  Patient: Jeffery [name]  Procedure(s) Performed: Procedure(s): IRRIGATION AND DEBRIDEMENT LEFT FOOT (Left)  Patient Location: PACU  Anesthesia Type:General  Level of Consciousness: awake, alert , oriented and patient cooperative  Airway & Oxygen Therapy: Patient Spontanous Breathing and Patient connected to nasal cannula oxygen  Post-op Assessment: Report given to RN, Post -op Vital signs reviewed and stable and Patient moving all extremities  Post vital signs: Reviewed and stable  Last Vitals:  Filed Vitals:   10/05/14 0950  BP: 115/82  Pulse: 98  Temp: 37.2 C  Resp: 18    Complications: No apparent anesthesia complications

## 2014-10-05 NOTE — Progress Notes (Signed)
Utilization review completed.  

## 2014-10-05 NOTE — Progress Notes (Signed)
Spoke with Dr. Erlinda Hong about need to continue IV antibiotics. He stated patient's amputation was clean and wound was looking ok, so fine with switching to PO.  Wound culture grew out MSSA only.    Plan: -per discussion with Dr. Erlinda Hong, will switch to Keflex. Patient has great renal function- can dose as 500mg  PO q6h   Hajar Penninger D. Aijalon Kirtz, PharmD, BCPS Clinical Pharmacist 10/05/2014 3:05 PM

## 2014-10-05 NOTE — Progress Notes (Signed)
[facility] PROGRESS NOTE  Jeffery [name] RCB:[id number] DOB: Mar 15, 1961 DOA: 10/01/2014 PCP: No PCP Per Patient  Assessment/Plan: 1 left third toe gangrene/osteomyelitis/cellulitis of left lower extremity -Plain films of the left third toe with findings consistent with osteomyelitis.  -Patient is status post left third toe amputation per Dr. Erlinda Hong 1 10/02/2014. Wound VAC in place. Pain management.  -Plan for OR again on Friday.  -Follow up culture: growing staph Aureus, follow sensitivity.  -continue with IV vancomycin and Zosyn day 4.  -Blood culture no growth to date.  Follow ortho recommendation for antibiotics.   2 poorly controlled diabetes mellitus type 2 Hemoglobin A1c was 13.9.  Will increase lantus to 28 units 7-9 Increase meals coverage to 6 units.  He will need assistance with medications. CM consulted.   3 hypertension Continue Norvasc.  4 prophylaxis DVT prophylaxis per orthopedics. heparin  5-Constipation:  miralax.   Code Status: Full Family Communication:  Disposition Plan: Pending PT evaluation and per orthopedics. Plan for OR again on friday   Consultants:  Orthopedics: Dr. Erlinda Hong 10/01/2014  Procedures:  Left third toe amputation per Dr.Xu 10/02/2014  Plain films of the left third toe 10/01/2014  Antibiotics:  IV vancomycin 10/01/2014  IV Zosyn 10/01/2014  HPI/Subjective: Right knee pain  Objective: Filed Vitals:   10/05/14 0605  BP: 139/77  Pulse: 87  Temp: 98.6 F (37 C)  Resp: 18    Intake/Output Summary (Last 24 hours) at 10/05/14 1016 Last data filed at 10/05/14 0435  Gross per 24 hour  Intake   1270 ml  Output   2975 ml  Net  -1705 ml   Filed Weights   10/01/14 1250  Weight: 120 kg (264 lb 8.8 oz)    Exam:   General:  NAD  Cardiovascular: RRR  Respiratory: CTAB  Abdomen: Soft, nontender, nondistended, positive bowel sounds.  Musculoskeletal: No clubbing or cyanosis. Left lower extremity with 1-2+ edema.  Left foot bandage. Wound VAC on left third ray. Status post left third toe amputation.  Data Reviewed: Basic Metabolic Panel:  Recent Labs Lab 10/01/14 0956 10/01/14 2056 10/02/14 0938 10/03/14 0612 10/04/14 0339  NA 133* 132* 135 134* 134*  K 4.3 4.2 3.7 3.7 4.0  CL 96* 97* 101 100* 101  CO2 _0 GLUCOSE 429* 445* 221* 243* 240*  BUN _1 <5* <5*  CREATININE 0.69 0.71 0.60* 0.63 0.59*  CALCIUM 9.5 8.6* 8.2* 8.6* 8.5*   Liver Function Tests: No results for input(s): AST, ALT, ALKPHOS, BILITOT, PROT, ALBUMIN in the last 168 hours. No results for input(s): LIPASE, AMYLASE in the last 168 hours. No results for input(s): AMMONIA in the last 168 hours. CBC:  Recent Labs Lab 10/01/14 0956 10/02/14 0938 10/03/14 0612 10/04/14 0339  WBC 9.0 7.3 7.5 7.8  NEUTROABS 6.3  --   --   --   HGB 15.3 15.3 14.0 14.6  HCT 44.4 44.0 41.9 42.9  MCV 87.4 90.2 89.1 89.0  PLT 258 212 208 223   Cardiac Enzymes: No results for input(s): CKTOTAL, CKMB, CKMBINDEX, TROPONINI in the last 168 hours. BNP (last 3 results) No results for input(s): BNP in the last 8760 hours.  ProBNP (last 3 results) No results for input(s): PROBNP in the last 8760 hours.  CBG:  Recent Labs Lab 10/04/14 0745 10/04/14 1130 10/04/14 1625 10/04/14 2228 10/05/14 0643  GLUCAP 278* 235* 364* 327* 230*    Recent Results (from the past 240 hour(s))  Culture, blood (  routine x 2)     Status: None (Preliminary result)   Collection Time: 10/01/14 11:35 AM  Result Value Ref Range Status   Specimen Description BLOOD RIGHT HAND  5 ML IN [facility] BOTTLE  Final   Special Requests Normal  Final   Culture   Final    NO GROWTH 3 DAYS Performed at [facility]    Report Status PENDING  Incomplete  Stat Gram stain     Status: None   Collection Time: 10/01/14 11:40 AM  Result Value Ref Range Status   Specimen Description WOUND  Final   Special Requests Normal  Final   Gram Stain   Final    ABUNDANT  WBC PRESENT, PREDOMINANTLY PMN ABUNDANT GRAM POSITIVE COCCI IN CLUSTERS FEW SQUAMOUS EPITHELIAL CELLS PRESENT Gram Stain Report Called to,Read Back By and Verified With: Vanessa Barbara [id number] @ [id number]    Report Status 10/01/2014 FINAL  Final  Wound culture     Status: None   Collection Time: 10/01/14 11:40 AM  Result Value Ref Range Status   Specimen Description TOE LEFT THIRD  Final   Special Requests Normal  Final   Gram Stain   Final    RARE WBC PRESENT, PREDOMINANTLY PMN NO SQUAMOUS EPITHELIAL CELLS SEEN ABUNDANT GRAM POSITIVE COCCI IN PAIRS IN CLUSTERS Performed at [facility]    Culture   Final    ABUNDANT STAPHYLOCOCCUS AUREUS Note: RIFAMPIN AND GENTAMICIN SHOULD NOT BE USED AS SINGLE DRUGS FOR TREATMENT OF STAPH INFECTIONS. Performed at [facility]    Report Status 10/04/2014 FINAL  Final   Organism ID, Bacteria STAPHYLOCOCCUS AUREUS  Final      Susceptibility   Staphylococcus aureus - MIC*    CLINDAMYCIN <=0.25 SENSITIVE Sensitive     ERYTHROMYCIN 0.5 SENSITIVE Sensitive     GENTAMICIN <=0.5 SENSITIVE Sensitive     LEVOFLOXACIN 0.25 SENSITIVE Sensitive     OXACILLIN 0.5 SENSITIVE Sensitive     PENICILLIN >=0.5 RESISTANT Resistant     RIFAMPIN <=0.5 SENSITIVE Sensitive     TRIMETH/SULFA <=10 SENSITIVE Sensitive     VANCOMYCIN <=0.5 SENSITIVE Sensitive     TETRACYCLINE <=1 SENSITIVE Sensitive     MOXIFLOXACIN <=0.25 SENSITIVE Sensitive     * ABUNDANT STAPHYLOCOCCUS AUREUS  Culture, blood (routine x 2)     Status: None (Preliminary result)   Collection Time: 10/01/14 11:41 AM  Result Value Ref Range Status   Specimen Description BLOOD LEFT ARM  5 ML IN [facility] BOTTLE  Final   Special Requests Normal  Final   Culture   Final    NO GROWTH 3 DAYS Performed at [facility]    Report Status PENDING  Incomplete  Surgical pcr screen     Status: Abnormal   Collection Time: 10/02/14  3:02 AM  Result Value Ref Range Status   MRSA, PCR  POSITIVE (A) NEGATIVE Final    Comment: RESULT CALLED TO, READ BACK BY AND VERIFIED WITH: Laney Potash [id number] WILDERK/MITCHELLL    Staphylococcus aureus POSITIVE (A) NEGATIVE Final    Comment:        The Xpert SA Assay (FDA approved for NASAL specimens in patients over 69 years of age), is one component of a comprehensive surveillance program.  Test performance has been validated by [facility] for patients greater than or equal to 39 year old. It is not intended to diagnose infection nor to guide or monitor treatment.  Studies: Dg Chest 2 View  10/05/2014   CLINICAL DATA:  Preoperative examination prior to wound VAC removal ; patient reports dyspnea on exertion, currently being treated for left lower extremity cellulitis, diabetes ; patient's current smoker  EXAM: CHEST  2 VIEW  COMPARISON:  Chest x-ray of September 11, 2010  FINDINGS: The lungs are adequately inflated. There is no focal infiltrate. The interstitial markings are coarse bilaterally but stable. The heart and pulmonary vascularity are normal. The mediastinum is normal in width. There is no pleural effusion. The trachea is midline. The bony thorax exhibits no acute abnormality.  IMPRESSION: Chronically increased interstitial lung markings are most compatible with the patient's smoking history. There is no active cardiopulmonary disease.   Electronically Signed   By: David  Martinique M.D.   On: 10/05/2014 09:14    Scheduled Meds: . amLODipine  5 mg Oral Daily  .  ceFAZolin (ANCEF) IV  3 g Intravenous On Call to OR  . Chlorhexidine Gluconate Cloth  6 each Topical Q0600  . insulin aspart  0-9 Units Subcutaneous TID WC  . insulin aspart  6 Units Subcutaneous TID WC  . [START ON 10/06/2014] insulin glargine  28 Units Subcutaneous Daily  . insulin starter kit- pen needles  1 kit Other Once  . mupirocin ointment  1 application Nasal BID  . piperacillin-tazobactam (ZOSYN)  IV  3.375 g Intravenous Q8H  . polyethylene glycol  17 g  Oral BID  . vancomycin  1,000 mg Intravenous Q12H   Continuous Infusions: . sodium chloride 100 mL/hr at 10/04/14 0020  . sodium chloride 125 mL/hr at 10/05/14 0713  . lactated ringers 10 mL/hr at 10/02/14 1225    Principal Problem:   Toe osteomyelitis, left Active Problems:   Cellulitis of leg, left   Uncontrolled diabetes mellitus with complications   Accelerated hypertension   Gangrene left third toe   Cellulitis of left lower extremity   Osteomyelitis of toe of left foot    Time spent: [place], Zunairah Devers A MD [facility] Pager [phone]. If 7PM-7AM, please contact night-coverage at [email or web address], password [facility] 10/05/2014, 10:16 AM  LOS: 4 days

## 2014-10-05 NOTE — Progress Notes (Signed)
From PACU alert oriented vac dsg intact at 125 Carb mod diet ordered

## 2014-10-05 NOTE — Anesthesia Postprocedure Evaluation (Signed)
  Anesthesia Post-op Note  Patient: Jeffery [name]  Procedure(s) Performed: Procedure(s) (LRB): IRRIGATION AND DEBRIDEMENT LEFT FOOT (Left)  Patient Location: PACU  Anesthesia Type: General  Level of Consciousness: awake and alert   Airway and Oxygen Therapy: Patient Spontanous Breathing  Post-op Pain: mild  Post-op Assessment: Post-op Vital signs reviewed, Patient's Cardiovascular Status Stable, Respiratory Function Stable, Patent Airway and No signs of Nausea or vomiting  Last Vitals:  Filed Vitals:   10/05/14 1345  BP:   Pulse: 44  Temp:   Resp: 12    Post-op Vital Signs: stable   Complications: No apparent anesthesia complications

## 2014-10-05 NOTE — Progress Notes (Signed)
Went over insulin starter kit with pt. Stated he wanted to go home with insulin pen use to give his dad injections.

## 2014-10-05 NOTE — Op Note (Signed)
   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 Secondary closure of surgical wound of left foot 2. Debridement of skin, subcutaneous tissue of left foot less than 20 cm 3. Application of negative wound therapy less than 50 cm next  Surgeon: Eduard Roux, M.D.  Anesthesia: Gen.  Estimated blood loss: Minimal  Complications: None  Indications for procedure: The patient presents today for repeat I&D of his left foot infection. He was aware of the risks benefits alternatives to surgery he wished to proceed. Consent was signed.  Description of procedure: The patient was identified in the preoperative holding area. The operative site was marked by the surgeon confirmed with the patient. He is brought back to the operating room. His placed supine on table. General anesthesia was induced. Timeout performed. Preoperative antibiosis given. Left lower extremity was prepped and draped in standard sterile fashion. We performed a sharp excisional debridement of the skin and subcutaneous tissue with a knife. The wound bed did exhibit beefy red tissue without any signs of continued infection. We then thoroughly irrigated the wound using pulse lavage. We then closed the proximal portion of the incision with interrupted 3-0 nylon sutures. The rest of the wound was treated with a wound VAC. The patient tolerated the procedure well and was extubated and transferred to the PACU in stable condition.  Disposition: The patient will be weightbearing to the heel only. He will need to go home with a home VAC for Monday Wednesday Friday wound VAC changes by home health nurse. We plan on letting the wound heal by secondary intention.  I will see him back in the office in 2 weeks.  Azucena Cecil, MD [place] [phone] 1:11 PM

## 2014-10-05 NOTE — Anesthesia Preprocedure Evaluation (Addendum)
Anesthesia Evaluation  Patient identified by MRN, date of birth, ID band Patient awake    Reviewed: Allergy & Precautions, H&P , NPO status , Patient's Chart, lab work & pertinent test results  History of Anesthesia Complications Negative for: history of anesthetic complications  Airway Mallampati: II  TM Distance: >3 FB Neck ROM: Full    Dental no notable dental hx. (+) Teeth Intact, Dental Advisory Given   Pulmonary Current Smoker,  breath sounds clear to auscultation  Pulmonary exam normal       Cardiovascular hypertension, Pt. on medications Rhythm:Regular Rate:Normal     Neuro/Psych negative neurological ROS  negative psych ROS   GI/Hepatic negative GI ROS, Neg liver ROS,   Endo/Other  diabetes, Poorly Controlled  Renal/GU negative Renal ROS  negative genitourinary   Musculoskeletal  (+) Arthritis -, Osteoarthritis,    Abdominal   Peds negative pediatric ROS (+)  Hematology negative hematology ROS (+)   Anesthesia Other Findings   Reproductive/Obstetrics negative OB ROS                            Anesthesia Physical  Anesthesia Plan  ASA: III  Anesthesia Plan: General   Post-op Pain Management:    Induction: Intravenous  Airway Management Planned: LMA  Additional Equipment:   Intra-op Plan:   Post-operative Plan: Extubation in OR  Informed Consent: I have reviewed the patients History and Physical, chart, labs and discussed the procedure including the risks, benefits and alternatives for the proposed anesthesia with the patient or authorized representative who has indicated his/her understanding and acceptance.   Dental advisory given  Plan Discussed with: CRNA  Anesthesia Plan Comments:         Anesthesia Quick Evaluation

## 2014-10-05 NOTE — Progress Notes (Signed)
Inpatient Diabetes Program Recommendations  AACE/ADA: New Consensus Statement on Inpatient Glycemic Control (2013)  Target Ranges:  Prepandial:   less than 140 mg/dL      Peak postprandial:   less than 180 mg/dL (1-2 hours)      Critically ill patients:  140 - 180 mg/dL   Reason for Visit: Hyperglycemia  Results for Jeffery, [name] (MRN [id number]) as of 10/05/2014 14:25  Ref. Range 10/04/2014 16:25 10/04/2014 22:28 10/05/2014 06:43 10/05/2014 10:16 10/05/2014 13:43  Glucose-Capillary Latest Ref Range: 65-99 mg/dL 364 (H) 327 (H) 230 (H) 192 (H) 226 (H)   Pt NPO for I&D today. Novolog 6 units tidwc added today for meal coverage insulin.  Recommendations: Increase Lantus to 30 units QHS Increase Novolog to moderate tidwc and hs.  RN to continue working with pt on insulin administration. Continue watching diabetes videos on pt ed channel.  Will continue to follow. Thank you. Lorenda Peck, RD, LDN, CDE Inpatient Diabetes Coordinator [phone]

## 2014-10-05 NOTE — Anesthesia Procedure Notes (Signed)
Procedure Name: LMA Insertion Date/Time: 10/05/2014 12:32 PM Performed by: Izora Gala Patient Re-evaluated:Patient Re-evaluated prior to inductionOxygen Delivery Method: Circle system utilized Preoxygenation: Pre-oxygenation with 100% oxygen Intubation Type: IV induction Ventilation: Mask ventilation without difficulty LMA: LMA inserted LMA Size: 5.0 Placement Confirmation: positive ETCO2 Tube secured with: Tape Dental Injury: Teeth and Oropharynx as per pre-operative assessment

## 2014-10-06 ENCOUNTER — Inpatient Hospital Stay (HOSPITAL_COMMUNITY): Payer: Self-pay

## 2014-10-06 LAB — CULTURE, BLOOD (ROUTINE X 2)
Culture: NO GROWTH
Culture: NO GROWTH
Special Requests: NORMAL
Special Requests: NORMAL

## 2014-10-06 LAB — GLUCOSE, CAPILLARY
Glucose-Capillary: 302 mg/dL — ABNORMAL HIGH (ref 65–99)
Glucose-Capillary: 281 mg/dL — ABNORMAL HIGH (ref 65–99)
Glucose-Capillary: 317 mg/dL — ABNORMAL HIGH (ref 65–99)
Glucose-Capillary: 193 mg/dL — ABNORMAL HIGH (ref 65–99)
Glucose-Capillary: 217 mg/dL — ABNORMAL HIGH (ref 65–99)
Glucose-Capillary: 276 mg/dL — ABNORMAL HIGH (ref 65–99)

## 2014-10-06 MED ORDER — INSULIN GLARGINE 100 UNIT/ML ~~LOC~~ SOLN
28.0000 [IU] | Freq: Every day | SUBCUTANEOUS | Status: DC
Start: 2014-10-06 — End: 2014-10-08
  Administered 2014-10-06 – 2014-10-07 (×2): 28 [IU] via SUBCUTANEOUS
  Filled 2014-10-06 (×2): qty 0.28

## 2014-10-06 MED ORDER — LIDOCAINE HCL (PF) 1 % IJ SOLN: 10.0000 mL | Freq: Once | INTRAMUSCULAR | Status: DC

## 2014-10-06 MED ORDER — TRIAMCINOLONE ACETONIDE 40 MG/ML IJ SUSP
40.0000 mg | Freq: Once | INTRAMUSCULAR | Status: DC
  Filled 2014-10-06: qty 1

## 2014-10-06 NOTE — Progress Notes (Signed)
[facility] PROGRESS NOTE  Jeffery [name] [id number] DOB: 25-Aug-1960 DOA: 10/01/2014 PCP: No PCP Per Jeffery [name]  Assessment/Plan: 1 left third toe gangrene/osteomyelitis/cellulitis of left lower extremity -Plain films of the left third toe with findings consistent with osteomyelitis.  -Jeffery [name] is status post left third toe amputation per Dr. Erlinda Hong 1 10/02/2014. Wound VAC in place. Pain management.  -Plan for OR again on Friday.  -Follow up culture: growing staph Aureus, follow sensitivity.  -received IV vancomycin and Zosyn for 4 days.  -Blood culture no growth to date.  -now on keflex , need 2 more weeks per ortho.  Need wound vac for home. CM consulted.  Antibiotics change to Keflex.   2 poorly controlled diabetes mellitus type 2 Hemoglobin A1c was 13.9.  Will increase lantus to 28 units 7-9 Increase meals coverage to 6 units.  He will need assistance with medications. CM consulted.   3 hypertension Continue Norvasc.  4 prophylaxis DVT prophylaxis per orthopedics. heparin  5-Constipation:  miralax.   Code Status: Full Family Communication:  Disposition Plan: Pending PT evaluation and per orthopedics. Plan for OR again on friday   Consultants:  Orthopedics: Dr. Erlinda Hong 10/01/2014  Procedures:  Left third toe amputation per Dr.Xu 10/02/2014  Plain films of the left third toe 10/01/2014  Antibiotics:  IV vancomycin 10/01/2014  IV Zosyn 10/01/2014  HPI/Subjective: Right knee pain better after aspiration. Had BM  Objective: Filed Vitals:   10/06/14 1447  BP: 134/75  Pulse: 76  Temp: 98.2 F (36.8 C)  Resp: 17    Intake/Output Summary (Last 24 hours) at 10/06/14 1611 Last data filed at 10/06/14 0534  Gross per 24 hour  Intake    560 ml  Output   4220 ml  Net  -3660 ml   Filed Weights   10/01/14 1250  Weight: 120 kg (264 lb 8.8 oz)    Exam:   General:  NAD  Cardiovascular: RRR  Respiratory: CTAB  Abdomen: Soft, nontender,  nondistended, positive bowel sounds.  Musculoskeletal: No clubbing or cyanosis. Left lower extremity with 1-2+ edema. Left foot bandage. Wound VAC on left foot. . Status post left third toe amputation.  Data Reviewed: Basic Metabolic Panel:  Recent Labs Lab 10/01/14 0956 10/01/14 2056 10/02/14 0938 10/03/14 0612 10/04/14 0339  NA 133* 132* 135 134* 134*  K 4.3 4.2 3.7 3.7 4.0  CL 96* 97* 101 100* 101  CO2 _0 GLUCOSE 429* 445* 221* 243* 240*  BUN _1 <5* <5*  CREATININE 0.69 0.71 0.60* 0.63 0.59*  CALCIUM 9.5 8.6* 8.2* 8.6* 8.5*   Liver Function Tests: No results for input(s): AST, ALT, ALKPHOS, BILITOT, PROT, ALBUMIN in the last 168 hours. No results for input(s): LIPASE, AMYLASE in the last 168 hours. No results for input(s): AMMONIA in the last 168 hours. CBC:  Recent Labs Lab 10/01/14 0956 10/02/14 0938 10/03/14 0612 10/04/14 0339  WBC 9.0 7.3 7.5 7.8  NEUTROABS 6.3  --   --   --   HGB 15.3 15.3 14.0 14.6  HCT 44.4 44.0 41.9 42.9  MCV 87.4 90.2 89.1 89.0  PLT 258 212 208 223   Cardiac Enzymes: No results for input(s): CKTOTAL, CKMB, CKMBINDEX, TROPONINI in the last 168 hours. BNP (last 3 results) No results for input(s): BNP in the last 8760 hours.  ProBNP (last 3 results) No results for input(s): PROBNP in the last 8760 hours.  CBG:  Recent Labs Lab 10/05/14 2159 10/06/14 0155 10/06/14 0532  10/06/14 0804 10/06/14 1151  GLUCAP 312* 302* 281* 317* 193*    Recent Results (from the past 240 hour(s))  Culture, blood (routine x 2)     Status: None   Collection Time: 10/01/14 11:35 AM  Result Value Ref Range Status   Specimen Description BLOOD RIGHT HAND  5 ML IN [facility] BOTTLE  Final   Special Requests Normal  Final   Culture   Final    NO GROWTH 5 DAYS Performed at [facility]    Report Status 10/06/2014 FINAL  Final  Stat Gram stain     Status: None   Collection Time: 10/01/14 11:40 AM  Result Value Ref Range Status    Specimen Description WOUND  Final   Special Requests Normal  Final   Gram Stain   Final    ABUNDANT WBC PRESENT, PREDOMINANTLY PMN ABUNDANT GRAM POSITIVE COCCI IN CLUSTERS FEW SQUAMOUS EPITHELIAL CELLS PRESENT Gram Stain Report Called to,Read Back By and Verified With: Vanessa Barbara [id number] @ [id number]    Report Status 10/01/2014 FINAL  Final  Wound culture     Status: None   Collection Time: 10/01/14 11:40 AM  Result Value Ref Range Status   Specimen Description TOE LEFT THIRD  Final   Special Requests Normal  Final   Gram Stain   Final    RARE WBC PRESENT, PREDOMINANTLY PMN NO SQUAMOUS EPITHELIAL CELLS SEEN ABUNDANT GRAM POSITIVE COCCI IN PAIRS IN CLUSTERS Performed at [facility]    Culture   Final    ABUNDANT STAPHYLOCOCCUS AUREUS Note: RIFAMPIN AND GENTAMICIN SHOULD NOT BE USED AS SINGLE DRUGS FOR TREATMENT OF STAPH INFECTIONS. Performed at [facility]    Report Status 10/04/2014 FINAL  Final   Organism ID, Bacteria STAPHYLOCOCCUS AUREUS  Final      Susceptibility   Staphylococcus aureus - MIC*    CLINDAMYCIN <=0.25 SENSITIVE Sensitive     ERYTHROMYCIN 0.5 SENSITIVE Sensitive     GENTAMICIN <=0.5 SENSITIVE Sensitive     LEVOFLOXACIN 0.25 SENSITIVE Sensitive     OXACILLIN 0.5 SENSITIVE Sensitive     PENICILLIN >=0.5 RESISTANT Resistant     RIFAMPIN <=0.5 SENSITIVE Sensitive     TRIMETH/SULFA <=10 SENSITIVE Sensitive     VANCOMYCIN <=0.5 SENSITIVE Sensitive     TETRACYCLINE <=1 SENSITIVE Sensitive     MOXIFLOXACIN <=0.25 SENSITIVE Sensitive     * ABUNDANT STAPHYLOCOCCUS AUREUS  Culture, blood (routine x 2)     Status: None   Collection Time: 10/01/14 11:41 AM  Result Value Ref Range Status   Specimen Description BLOOD LEFT ARM  5 ML IN [facility] BOTTLE  Final   Special Requests Normal  Final   Culture   Final    NO GROWTH 5 DAYS Performed at [facility]    Report Status 10/06/2014 FINAL  Final  Surgical pcr screen     Status:  Abnormal   Collection Time: 10/02/14  3:02 AM  Result Value Ref Range Status   MRSA, PCR POSITIVE (A) NEGATIVE Final    Comment: RESULT CALLED TO, READ BACK BY AND VERIFIED WITH: Laney Potash [id number] WILDERK/MITCHELLL    Staphylococcus aureus POSITIVE (A) NEGATIVE Final    Comment:        The Xpert SA Assay (FDA approved for NASAL specimens in patients over 75 years of age), is one component of a comprehensive surveillance program.  Test performance has been validated by [facility] for patients greater than or equal  to 83 year old. It is not intended to diagnose infection nor to guide or monitor treatment.      Studies: Dg Chest 2 View  10/05/2014   CLINICAL DATA:  Preoperative examination prior to wound VAC removal ; Jeffery [name] reports dyspnea on exertion, currently being treated for left lower extremity cellulitis, diabetes ; Jeffery [name]'s current smoker  EXAM: CHEST  2 VIEW  COMPARISON:  Chest x-ray of September 11, 2010  FINDINGS: The lungs are adequately inflated. There is no focal infiltrate. The interstitial markings are coarse bilaterally but stable. The heart and pulmonary vascularity are normal. The mediastinum is normal in width. There is no pleural effusion. The trachea is midline. The bony thorax exhibits no acute abnormality.  IMPRESSION: Chronically increased interstitial lung markings are most compatible with the Jeffery [name]'s smoking history. There is no active cardiopulmonary disease.   Electronically Signed   By: David  Martinique M.D.   On: 10/05/2014 09:14   Dg Knee Right Port  10/06/2014   CLINICAL DATA:  Generalized pain and swelling of the right knee for 2 days. Knee surgery 26 years ago.  EXAM: PORTABLE RIGHT KNEE - 1-2 VIEW  COMPARISON:  None.  FINDINGS: Tibial staple and femoral interference screw suggest prior ACL reconstruction.  Prominent tricompartmental spurring observed with suspected chondrocalcinosis. Large effusion in the tibiotalar joint. Thick appearance of the  patellar tendon (although this might be from a prior graft harvest). Fragmented osteophytes versus free osteochondral fragments posteriorly in the knee joint.  IMPRESSION: 1. Severe tricompartmental spurring with chondrocalcinosis, query CPPD arthropathy. 2. Large knee effusion. 3. Cannot exclude free osteochondral fragments posteriorly in the knee joint. 4. Prior ACL reconstruction.   Electronically Signed   By: Van Clines M.D.   On: 10/06/2014 08:38    Scheduled Meds: . amLODipine  5 mg Oral Daily  . cephALEXin  500 mg Oral Q6H  . diclofenac sodium  2 g Topical QID  . insulin aspart  0-9 Units Subcutaneous TID WC  . insulin aspart  6 Units Subcutaneous TID WC  . insulin glargine  28 Units Subcutaneous Daily  . insulin starter kit- pen needles  1 kit Other Once  . lidocaine (PF)  10 mL Other Once  . mupirocin ointment  1 application Nasal BID  . polyethylene glycol  17 g Oral BID  . triamcinolone acetonide  40 mg Intra-articular Once   Continuous Infusions: . sodium chloride 100 mL/hr at 10/06/14 0529  . lactated ringers 10 mL/hr at 10/02/14 1225  . lactated ringers 50 mL/hr (10/05/14 1051)    Principal Problem:   Toe osteomyelitis, left Active Problems:   Cellulitis of leg, left   Uncontrolled diabetes mellitus with complications   Accelerated hypertension   Gangrene left third toe   Cellulitis of left lower extremity   Osteomyelitis of toe of left foot    Time spent: Kuna, Samauri Kellenberger A MD [facility] Pager [phone]. If 7PM-7AM, please contact night-coverage at [email or web address], password [facility] 10/06/2014, 4:11 PM  LOS: 5 days

## 2014-10-06 NOTE — Progress Notes (Signed)
Deferring referral at this time. CM arranging DME and [place] for patient. If other needs arise, please re-consult for SW.  Lane Hacker, MSW Clinical Social Work: Emergency Room [phone]

## 2014-10-06 NOTE — Care Management Note (Signed)
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  Action/Plan:  Discharge Planning  Expected Discharge Date:                  Expected Discharge Plan:  [place]  In-House Referral:     Discharge planning Services  CM Consult  Post Acute Care Choice:    Choice offered to:  Patient  DME Arranged:  Negative pressure wound device DME Agency:  [place]:  RN [facility] Agency:  [place]  Status of Service:  In process, will continue to follow  Medicare Important Message Given:    Date Medicare IM Given:    Medicare IM give by:    Date Additional Medicare IM Given:    Additional Medicare Important Message give by:     If discussed at [place] of Stay Meetings, dates discussed:    Additional Comments:  CM spoke with patient at the bedside. Patient has used [facility] in the past. Tiffany at [facility] notified for referral for [facility] and wound vac. Patient is Medicaid potential. [facility] will review for charity qualification and notify Case Management.   Apolonio Schneiders, RN 10/06/2014, 12:48 PM

## 2014-10-06 NOTE — Progress Notes (Signed)
60 cc of clear synovial fluid aspirated from the right knee.  No signs of infection. xrays consistent with DJD.  Can go home from ortho standpoint after home vac is approved and [facility] RN is set up for MWF VAC changes.  Azucena Cecil, MD [place] 9:17 AM

## 2014-10-06 NOTE — Progress Notes (Addendum)
Current CM has followed up on the Wound vac need for patient.  This will be charity due to patient has no medical insurance coverage.  [facility] has agreed to assist.  [facility] has required complete Wound Vac order in Epic, stating location of wound, wound size, frequency of care, description of wound and any other specifics needed by the prescriber.  Also, [facility] has provided a 'Negative Pressure Wound Therapy Order Form' to be completed by prescriber.  CM has informed Irven Baltimore, Programme researcher, broadcasting/film/video currently caring for patient.  [facility] requested if it is possible to have wet-to-dry dressing for discharge and the [facility] will see patient at home tomorrow - 10/07/14 to apply the wound vac.  Primary RN caring for patient contacted MD and wet-to-dry dressing was not approved.  Current CM updates Tiffany of [facility] at [phone],  Jonelle Sidle says their Nurse can come to the hospital but she will need to arrange that early and know the specifications of the wound so [facility] Nurse can bring the correct size and type of wound vac to the hospital.  CM update Tiffany it will be for both left and right feet with Y-shaped tubing to connect both wound sites as reported by Primary RN. CMVicente Males, working on 10/07/14 notified.  CM will continue to follow up as needed.  Correction: Patient only has one wound site for the wound vac. Patient does not need the Y tubing for his wound vac. Wound is only to left foot. Thank you.  Darrol Jump, RN

## 2014-10-07 LAB — GLUCOSE, CAPILLARY
Glucose-Capillary: 212 mg/dL — ABNORMAL HIGH (ref 65–99)
Glucose-Capillary: 234 mg/dL — ABNORMAL HIGH (ref 65–99)
Glucose-Capillary: 250 mg/dL — ABNORMAL HIGH (ref 65–99)
Glucose-Capillary: 263 mg/dL — ABNORMAL HIGH (ref 65–99)
Glucose-Capillary: 269 mg/dL — ABNORMAL HIGH (ref 65–99)
Glucose-Capillary: 300 mg/dL — ABNORMAL HIGH (ref 65–99)

## 2014-10-07 LAB — BASIC METABOLIC PANEL
Anion gap: 6 (ref 5–15)
BUN: 6 mg/dL (ref 6–20)
CO2: 28 mmol/L (ref 22–32)
Calcium: 8.9 mg/dL (ref 8.9–10.3)
Chloride: 99 mmol/L — ABNORMAL LOW (ref 101–111)
Creatinine, Ser: 0.68 mg/dL (ref 0.61–1.24)
GFR calc Af Amer: >60 mL/min (ref >60)
GFR calc non Af Amer: >60 mL/min (ref >60)
Glucose, Bld: 278 mg/dL — ABNORMAL HIGH (ref 65–99)
Potassium: 4.1 mmol/L (ref 3.5–5.1)
Sodium: 133 mmol/L — ABNORMAL LOW (ref 135–145)

## 2014-10-07 LAB — CBC
HCT: 44.4 % (ref 39.0–52.0)
Hemoglobin: 15 g/dL (ref 13.0–17.0)
MCH: 30.4 pg (ref 26.0–34.0)
MCHC: 33.8 g/dL (ref 30.0–36.0)
MCV: 89.9 fL (ref 78.0–100.0)
Platelets: 263 10*3/uL (ref 150–400)
RBC: 4.94 MIL/uL (ref 4.22–5.81)
RDW: 12.9 % (ref 11.5–15.5)
WBC: 9.6 10*3/uL (ref 4.0–10.5)

## 2014-10-07 MED ORDER — POLYETHYLENE GLYCOL 3350 17 G PO PACK: 17.0000 g | PACK | Freq: Two times a day (BID) | ORAL | Status: DC

## 2014-10-07 MED ORDER — INSULIN GLARGINE 100 UNIT/ML ~~LOC~~ SOLN: 30.0000 [IU] | Freq: Every day | SUBCUTANEOUS | Status: DC

## 2014-10-07 MED ORDER — INSULIN ASPART 100 UNIT/ML ~~LOC~~ SOLN: 6.0000 [IU] | Freq: Three times a day (TID) | SUBCUTANEOUS | Status: DC

## 2014-10-07 MED ORDER — OXYCODONE HCL 5 MG PO TABS: 5.0000 mg | ORAL_TABLET | ORAL | Status: DC | PRN

## 2014-10-07 MED ORDER — AMLODIPINE BESYLATE 5 MG PO TABS: 5.0000 mg | ORAL_TABLET | Freq: Every day | ORAL | Status: DC

## 2014-10-07 MED ORDER — INSULIN GLARGINE 100 UNIT/ML ~~LOC~~ SOLN: 28.0000 [IU] | Freq: Every day | SUBCUTANEOUS | Status: DC

## 2014-10-07 MED ORDER — CEPHALEXIN 500 MG PO CAPS: 500.0000 mg | ORAL_CAPSULE | Freq: Four times a day (QID) | ORAL | Status: DC

## 2014-10-07 NOTE — Progress Notes (Signed)
2140 pt discharged. Home Health nurse here. New dressing applied with home wound vac equipment. Wound sealed well and tissues around incision were pink with small amount serosanguinous drainage.

## 2014-10-07 NOTE — Care Management Note (Addendum)
Case Management Note  Patient Details  Name: Jeffery [name] MRN: [id number] Date of Birth: 01-08-1961  Subjective/Objective:                  1 left third toe gangrene/osteomyelitis/cellulitis of left lower extremity Left foot status post third toe amputation  Action/Plan: CM spoke to the patient at the bedside who is planning to be discharged home today with home health RN for wound therapy and wound vac. CM spoke to [place] with [facility] who is aware and that a nurse will come tonight to change the wound vac between 5pm and 9pm. CM instructed to have the primary RN call [facility] main number when the wound vac arrives; contact number is [phone]. CM outreach to [place], RN to notify of contact information and understanding verbalized. [facility] Pt states that he has DME at home (crutches and cane) and pt denies the need for additional assistance with DME(to declines shower chair or 3N1). Pt states that he will need assistance with medication. CM offered MATCH program to pt or to fill RX at [place] and pt states that since the discharge is late, he will have them filled at the [facility] health and wellness center tomorrow. Pt states that his mom can come and get his pain medication RX tonight and have it filled today since he may be discharged late. Pt going home with his mother and states that he has enough assistance at home. CM spoke to [employer] about medications and understanding verbalized. Cm asked RN to callback for any additional needs that may arise.   Expected Discharge Date:  10/07/14             Expected Discharge Plan:  [place]  In-House Referral:     Discharge planning Services  CM Consult  Post Acute Care Choice:    Choice offered to:  Patient  DME Arranged:  Negative pressure wound device DME Agency:  [place]:  RN [facility] Agency:  [place]  Status of Service:   Medicare Important Message Given:    Date Medicare IM Given:    Medicare IM give by:    Date Additional Medicare IM Given:    Additional Medicare Important Message give by:     If discussed at [place] of Stay Meetings, dates discussed:    Additional Comments:  [name], RN 10/07/2014, 4:35 PM

## 2014-10-07 NOTE — Discharge Summary (Signed)
Physician Discharge Summary  Jeffery [name] [id number] DOB: [birth year] DOA: 10/01/2014  PCP: No PCP Per Patient  Admit date: 10/01/2014 Discharge date: 10/07/2014  Time spent: 35 minutes  Recommendations for Outpatient Follow-up:  Follow up with Dr Erlinda Hong post op and for wound care,\ Follow up with PCP for further adjustment of diabetes medications.   Discharge Diagnoses:    Toe osteomyelitis, left S/P amputation.    Cellulitis of leg, left   Uncontrolled diabetes mellitus with complications   Accelerated hypertension   Gangrene left third toe   Cellulitis of left lower extremity   Osteomyelitis of toe of left foot   Discharge Condition: stable.   Diet recommendation: heart healthy  Filed Weights   10/01/14 1250  Weight: 120 kg (264 lb 8.8 oz)    History of present illness:  Jeffery [name] is an 54 y.o. male with a PMH of type II DM, diagnosed 03/2010 when he presented to the hospital with a hand abscess, but no further aftercare. He has had a toe wound for about a month. The patient says he noticed a blister on his toe subsequent to this which he lanced with a razor blade to expel the pus about a week ago. Over the past 2 days, the toe has become black. He originally tried to heal the wound with Neosporin and soaked it with Epson Salts, and also used peroxide and alcohol on it. He denies any loss of feeling in his feet, but notes that the alcohol didn't cause him any discomfort. No associated fever or chills. He does feel pain in his ankle from the swelling, and has a dull pain, rated 5-6/10. He also feels a sensation of heat. Occasionally has a sudden sharp pain. With regard to his diabetes, he has not followed up with a PCP since his diagnosis 4 years ago, and has tried self management with diet control and weight loss. The patient tells me he is uninsured and has not had the finances to see a PCP.  Hospital Course:  1 left third toe  gangrene/osteomyelitis/cellulitis of left lower extremity -Plain films of the left third toe with findings consistent with osteomyelitis.  -Patient is status post left third toe amputation per Dr. Erlinda Hong 1 10/02/2014. Wound VAC in place. Pain management.  -Plan for OR again on Friday.  -Follow up culture: growing staph Aureus, MSSA -received IV vancomycin and Zosyn for 4 days.  -Blood culture no growth to date.  -now on keflex , need 2 more weeks per ortho.  Need wound vac for home. CM consulted.  Antibiotics change to Keflex.  Proved 2 weeks of treatment   2 poorly controlled diabetes mellitus type 2 Hemoglobin A1c was 13.9. Will increase lantus to 30 units meals coverage to 6 units.  He will need assistance with medications. CM consulted.   3 hypertension Continue Norvasc.  4 prophylaxis DVT prophylaxis per orthopedics. heparin  5-Constipation: miralax  Procedures:  3 rd toe amputation  Consultations:  Dr Erlinda Hong  Discharge Exam: Filed Vitals:   10/07/14 0441  BP: 124/70  Pulse: 86  Temp: 97.8 F (36.6 C)  Resp: 16    General: Alert in no distress Cardiovascular: S 1, S 2 RRR Respiratory: CTA Left foot with wound vac in place  Discharge Instructions   Discharge Instructions    Diet Carb Modified    Complete by:  As directed      Increase activity slowly    Complete by:  As directed  Current Discharge Medication List    START taking these medications   Details  amLODipine (NORVASC) 5 MG tablet Take 1 tablet (5 mg total) by mouth daily. Qty: 30 tablet, Refills: 0    cephALEXin (KEFLEX) 500 MG capsule Take 1 capsule (500 mg total) by mouth every 6 (six) hours. Qty: 56 capsule, Refills: 0    insulin aspart (NOVOLOG) 100 UNIT/ML injection Inject 6 Units into the skin 3 (three) times daily with meals. Qty: 10 mL, Refills: 11    insulin glargine (LANTUS) 100 UNIT/ML injection Inject 0.3 mLs (30 Units total) into the skin daily. Please  provide pen Qty: 10 mL, Refills: 11    oxyCODONE (OXY IR/ROXICODONE) 5 MG immediate release tablet Take 1 tablet (5 mg total) by mouth every 4 (four) hours as needed for moderate pain. Qty: 40 tablet, Refills: 0    polyethylene glycol (MIRALAX / GLYCOLAX) packet Take 17 g by mouth 2 (two) times daily. Qty: 14 each, Refills: 0      STOP taking these medications     ibuprofen (ADVIL,MOTRIN) 200 MG tablet      oxymetazoline (AFRIN) 0.05 % nasal spray        No Known Allergies Follow-up Information    Follow up with Jeffery Payment, MD In 2 weeks.   Specialty:  Orthopedic Surgery   Why:  For wound re-check   Contact information:   [place] [address] [phone]       Please follow up.   Why:  need PCP appointment in 1 week       The results of significant diagnostics from this hospitalization (including imaging, microbiology, ancillary and laboratory) are listed below for reference.    Significant Diagnostic Studies: Dg Chest 2 View  10/05/2014   CLINICAL DATA:  Preoperative examination prior to wound VAC removal ; patient reports dyspnea on exertion, currently being treated for left lower extremity cellulitis, diabetes ; patient's current smoker  EXAM: CHEST  2 VIEW  COMPARISON:  Chest x-ray of September 11, 2010  FINDINGS: The lungs are adequately inflated. There is no focal infiltrate. The interstitial markings are coarse bilaterally but stable. The heart and pulmonary vascularity are normal. The mediastinum is normal in width. There is no pleural effusion. The trachea is midline. The bony thorax exhibits no acute abnormality.  IMPRESSION: Chronically increased interstitial lung markings are most compatible with the patient's smoking history. There is no active cardiopulmonary disease.   Electronically Signed   By: David  Martinique M.D.   On: 10/05/2014 09:14   Dg Knee Right Port  10/06/2014   CLINICAL DATA:  Generalized pain and swelling of the right knee  for 2 days. Knee surgery 26 years ago.  EXAM: PORTABLE RIGHT KNEE - 1-2 VIEW  COMPARISON:  None.  FINDINGS: Tibial staple and femoral interference screw suggest prior ACL reconstruction.  Prominent tricompartmental spurring observed with suspected chondrocalcinosis. Large effusion in the tibiotalar joint. Thick appearance of the patellar tendon (although this might be from a prior graft harvest). Fragmented osteophytes versus free osteochondral fragments posteriorly in the knee joint.  IMPRESSION: 1. Severe tricompartmental spurring with chondrocalcinosis, query CPPD arthropathy. 2. Large knee effusion. 3. Cannot exclude free osteochondral fragments posteriorly in the knee joint. 4. Prior ACL reconstruction.   Electronically Signed   By: Van Clines M.D.   On: 10/06/2014 08:38   Dg Toe 3rd Left  10/01/2014   CLINICAL DATA:  Injury 1 month ago. Pus pocket on the third  toe. Third toe is discolored.  EXAM: LEFT THIRD TOE  COMPARISON:  None.  FINDINGS: There is a displaced fracture of the second toe proximal phalanx with callus formation. Age of this fracture is unknown. There is soft tissue swelling involving the third toe with soft tissue calcifications. There is cortical irregularity and fragmentation of the third toe distal phalanx. Question old fracture of the fifth toe proximal phalanx.  IMPRESSION: Cortical irregularity with bone destruction/fractures of the third toe distal phalanx. Findings are concerning for osteomyelitis. In addition, there is soft tissue swelling with soft tissue calcifications or ossifications in the third toe.  Probable old fractures of the second toe proximal phalanx and fifth toe proximal phalanx.   Electronically Signed   By: Markus Daft M.D.   On: 10/01/2014 10:35    Microbiology: Recent Results (from the past 240 hour(s))  Culture, blood (routine x 2)     Status: None   Collection Time: 10/01/14 11:35 AM  Result Value Ref Range Status   Specimen Description BLOOD RIGHT  HAND  5 ML IN [facility] BOTTLE  Final   Special Requests Normal  Final   Culture   Final    NO GROWTH 5 DAYS Performed at [facility]    Report Status 10/06/2014 FINAL  Final  Stat Gram stain     Status: None   Collection Time: 10/01/14 11:40 AM  Result Value Ref Range Status   Specimen Description WOUND  Final   Special Requests Normal  Final   Gram Stain   Final    ABUNDANT WBC PRESENT, PREDOMINANTLY PMN ABUNDANT GRAM POSITIVE COCCI IN CLUSTERS FEW SQUAMOUS EPITHELIAL CELLS PRESENT Gram Stain Report Called to,Read Back By and Verified With: Vanessa Barbara [id number] @ [id number] [place]    Report Status 10/01/2014 FINAL  Final  Wound culture     Status: None   Collection Time: 10/01/14 11:40 AM  Result Value Ref Range Status   Specimen Description TOE LEFT THIRD  Final   Special Requests Normal  Final   Gram Stain   Final    RARE WBC PRESENT, PREDOMINANTLY PMN NO SQUAMOUS EPITHELIAL CELLS SEEN ABUNDANT GRAM POSITIVE COCCI IN PAIRS IN CLUSTERS Performed at [facility]    Culture   Final    ABUNDANT STAPHYLOCOCCUS AUREUS Note: RIFAMPIN AND GENTAMICIN SHOULD NOT BE USED AS SINGLE DRUGS FOR TREATMENT OF STAPH INFECTIONS. Performed at [facility]    Report Status 10/04/2014 FINAL  Final   Organism ID, Bacteria STAPHYLOCOCCUS AUREUS  Final      Susceptibility   Staphylococcus aureus - MIC*    CLINDAMYCIN <=0.25 SENSITIVE Sensitive     ERYTHROMYCIN 0.5 SENSITIVE Sensitive     GENTAMICIN <=0.5 SENSITIVE Sensitive     LEVOFLOXACIN 0.25 SENSITIVE Sensitive     OXACILLIN 0.5 SENSITIVE Sensitive     PENICILLIN >=0.5 RESISTANT Resistant     RIFAMPIN <=0.5 SENSITIVE Sensitive     TRIMETH/SULFA <=10 SENSITIVE Sensitive     VANCOMYCIN <=0.5 SENSITIVE Sensitive     TETRACYCLINE <=1 SENSITIVE Sensitive     MOXIFLOXACIN <=0.25 SENSITIVE Sensitive     * ABUNDANT STAPHYLOCOCCUS AUREUS  Culture, blood (routine x 2)     Status: None   Collection Time: 10/01/14 11:41  AM  Result Value Ref Range Status   Specimen Description BLOOD LEFT ARM  5 ML IN [facility] BOTTLE  Final   Special Requests Normal  Final   Culture   Final    NO  GROWTH 5 DAYS Performed at [facility]    Report Status 10/06/2014 FINAL  Final  Surgical pcr screen     Status: Abnormal   Collection Time: 10/02/14  3:02 AM  Result Value Ref Range Status   MRSA, PCR POSITIVE (A) NEGATIVE Final    Comment: RESULT CALLED TO, READ BACK BY AND VERIFIED WITH: Laney Potash [id number] WILDERK/MITCHELLL    Staphylococcus aureus POSITIVE (A) NEGATIVE Final    Comment:        The Xpert SA Assay (FDA approved for NASAL specimens in patients over 70 years of age), is one component of a comprehensive surveillance program.  Test performance has been validated by [facility] for patients greater than or equal to 89 year old. It is not intended to diagnose infection nor to guide or monitor treatment.      Labs: Basic Metabolic Panel:  Recent Labs Lab 10/01/14 2056 10/02/14 0938 10/03/14 0612 10/04/14 0339 10/07/14 0504  NA 132* 135 134* 134* 133*  K 4.2 3.7 3.7 4.0 4.1  CL 97* 101 100* 101 99*  CO2 26 28 27 25 28   GLUCOSE 445* 221* 243* 240* 278*  BUN 10 7 <5* <5* 6  CREATININE 0.71 0.60* 0.63 0.59* 0.68  CALCIUM 8.6* 8.2* 8.6* 8.5* 8.9   Liver Function Tests: No results for input(s): AST, ALT, ALKPHOS, BILITOT, PROT, ALBUMIN in the last 168 hours. No results for input(s): LIPASE, AMYLASE in the last 168 hours. No results for input(s): AMMONIA in the last 168 hours. CBC:  Recent Labs Lab 10/01/14 0956 10/02/14 0938 10/03/14 0612 10/04/14 0339 10/07/14 0504  WBC 9.0 7.3 7.5 7.8 9.6  NEUTROABS 6.3  --   --   --   --   HGB 15.3 15.3 14.0 14.6 15.0  HCT 44.4 44.0 41.9 42.9 44.4  MCV 87.4 90.2 89.1 89.0 89.9  PLT 258 212 208 223 263   Cardiac Enzymes: No results for input(s): CKTOTAL, CKMB, CKMBINDEX, TROPONINI in the last 168 hours. BNP: BNP (last 3 results) No  results for input(s): BNP in the last 8760 hours.  ProBNP (last 3 results) No results for input(s): PROBNP in the last 8760 hours.  CBG:  Recent Labs Lab 10/06/14 2006 10/07/14 0006 10/07/14 0444 10/07/14 0835 10/07/14 1154  GLUCAP 276* 300* 263* 269* 250*       Signed:  Britny Riel A  [facility] 10/07/2014, 12:17 PM

## 2014-10-07 NOTE — Progress Notes (Signed)
CM spoke to Dorado, RN caring for the pt who is aware that wound specifics are needed on the 'Negative Pressure Wound Therapy Order Form' and MD is aware .Tiffany with [facility] aware of Loraine orders for wound vac and states that a RN from [facility] will be able to come to the hospital today to change the wound vac once the form is sent in completed.

## 2014-10-07 NOTE — Progress Notes (Signed)
Subjective: 2 Days Post-Op Procedure(s) (LRB): IRRIGATION AND DEBRIDEMENT LEFT FOOT (Left) Patient reports pain as mild.    Objective: Vital signs in last 24 hours: Temp:  [97.8 F (36.6 C)-98.2 F (36.8 C)] 97.8 F (36.6 C) (07/10 0441) Pulse Rate:  [76-92] 86 (07/10 0441) Resp:  [16-17] 16 (07/10 0441) BP: (124-139)/(70-92) 124/70 mmHg (07/10 0441) SpO2:  [95 %-98 %] 95 % (07/10 0441)  Intake/Output from previous day: 07/09 0701 - 07/10 0700 In: 720 [P.O.:720] Out: 600 [Urine:600] Intake/Output this shift:     Recent Labs  10/07/14 0504  HGB 15.0    Recent Labs  10/07/14 0504  WBC 9.6  RBC 4.94  HCT 44.4  PLT 263    Recent Labs  10/07/14 0504  NA 133*  K 4.1  CL 99*  CO2 28  BUN 6  CREATININE 0.68  GLUCOSE 278*  CALCIUM 8.9   No results for input(s): LABPT, INR in the last 72 hours.  VAC intact low leak rate.   Assessment/Plan: 2 Days Post-Op Procedure(s) (LRB): IRRIGATION AND DEBRIDEMENT LEFT FOOT (Left) Discharge home with home health will need VAC.   VAC form signed.   Zalen Sequeira C 10/07/2014, 8:47 AM

## 2014-10-08 ENCOUNTER — Encounter (HOSPITAL_COMMUNITY): Payer: Self-pay | Admitting: Orthopaedic Surgery

## 2014-10-15 ENCOUNTER — Ambulatory Visit: Payer: Self-pay | Attending: Family Medicine | Admitting: Family Medicine

## 2014-10-15 ENCOUNTER — Encounter: Payer: Self-pay | Admitting: Family Medicine

## 2014-10-15 VITALS — BP 137/93 | HR 77 | Temp 97.4°F | Resp 16 | Ht 74.0 in | Wt 274.0 lb

## 2014-10-15 DIAGNOSIS — Z794 Long term (current) use of insulin: Secondary | ICD-10-CM | POA: Insufficient documentation

## 2014-10-15 DIAGNOSIS — E1165 Type 2 diabetes mellitus with hyperglycemia: Secondary | ICD-10-CM

## 2014-10-15 DIAGNOSIS — Z89422 Acquired absence of other left toe(s): Secondary | ICD-10-CM | POA: Insufficient documentation

## 2014-10-15 DIAGNOSIS — L03032 Cellulitis of left toe: Secondary | ICD-10-CM | POA: Insufficient documentation

## 2014-10-15 DIAGNOSIS — I10 Essential (primary) hypertension: Secondary | ICD-10-CM

## 2014-10-15 DIAGNOSIS — Z79899 Other long term (current) drug therapy: Secondary | ICD-10-CM | POA: Insufficient documentation

## 2014-10-15 DIAGNOSIS — M869 Osteomyelitis, unspecified: Secondary | ICD-10-CM

## 2014-10-15 DIAGNOSIS — F17210 Nicotine dependence, cigarettes, uncomplicated: Secondary | ICD-10-CM | POA: Insufficient documentation

## 2014-10-15 LAB — GLUCOSE, POCT (MANUAL RESULT ENTRY): POC Glucose: 252 mg/dl — AB (ref 70–99)

## 2014-10-15 MED ORDER — LISINOPRIL 5 MG PO TABS: 5.0000 mg | ORAL_TABLET | Freq: Every day | ORAL | Status: DC

## 2014-10-15 MED ORDER — INSULIN GLARGINE 100 UNIT/ML ~~LOC~~ SOLN: 35.0000 [IU] | Freq: Every day | SUBCUTANEOUS | Status: DC

## 2014-10-15 NOTE — Progress Notes (Signed)
Patient ID: Jeffery [name], male   DOB: 02-Sep-1960, 54 y.o.   MRN: [id number]    Verley Laudon, is a 54 y.o. male  [id number]  [id number]  DOB - [birth year]  CC:  Chief Complaint  Patient presents with  . Osteomyelitis        HPI: Jeffery [name] is a 54 y.o. male with a history of uncontrolled diabetes mellitus, hypertension who presented with a toe infection for one month and had lanced the blister with a razor blade to get out the pus and had noticed have blackish discoloration of the toe which led him to come to the ED at [facility]; he had also not been under the care of any physician for the management of his diabetes.  He had an x-ray of the left foot which revealed findings in the left third 2 consistent with osteomyelitis and so he was taken to the operating room for amputation of the left third toe on 10/02/14 and a wound VAC was placed. Wound culture was positive for MSSA and he received IV vancomycin and Zosyn for 4 days, blood culture revealed no growth to date.  His condition stabilized and he was switched to oral Keflex and home health nurse arranged for wound VAC. For his diabetes, his Lantus was increased to 30 units daily and he was given a meal coverage of 6 units was advised to follow-up in the outpatient clinic.  Interval history: She reports doing well and he has a nurse come [facility] from [facility] to help with the wound VAC. He has a compliant with checking his random blood sugars and his numbers have been in the 150s to 160s; he is not here with his blood sugar log. Patient has No headache, No chest pain, No abdominal pain - No Nausea, No new weakness tingling or numbness, No Cough - SOB.  No Known Allergies Past Medical History  Diagnosis Date  . Diabetes mellitus without complication   . Hypertension   . Rotator cuff disorder   . DDD (degenerative disc disease), lumbar    Current Outpatient Prescriptions on File Prior to Visit    Medication Sig Dispense Refill  . amLODipine (NORVASC) 5 MG tablet Take 1 tablet (5 mg total) by mouth daily. 30 tablet 0  . cephALEXin (KEFLEX) 500 MG capsule Take 1 capsule (500 mg total) by mouth every 6 (six) hours. 56 capsule 0  . insulin aspart (NOVOLOG) 100 UNIT/ML injection Inject 6 Units into the skin 3 (three) times daily with meals. 10 mL 11  . insulin glargine (LANTUS) 100 UNIT/ML injection Inject 0.3 mLs (30 Units total) into the skin daily. Please provide pen 10 mL 11  . oxyCODONE (OXY IR/ROXICODONE) 5 MG immediate release tablet Take 1 tablet (5 mg total) by mouth every 4 (four) hours as needed for moderate pain. 40 tablet 0  . polyethylene glycol (MIRALAX / GLYCOLAX) packet Take 17 g by mouth 2 (two) times daily. (Patient not taking: Reported on 10/15/2014) 14 each 0   No current facility-administered medications on file prior to visit.   Family History  Problem Relation Age of Onset  . Diabetes Father   . Hypertension Father   . Heart failure Father    History   Social History  . Marital Status: Single    Spouse Name: N/A  . Number of Children: N/A  . Years of Education: N/A   Occupational History  . Landscaping & rental handyman    Social History Main Topics  .  Smoking status: Current Some Day Smoker  . Smokeless tobacco: Not on file  . Alcohol Use: 0.0 oz/week    0 Standard drinks or equivalent per week     Comment: 6 beers a day,10/15/14 patient states 10 beers in 2 weeks  . Drug Use: No  . Sexual Activity: Not on file   Other Topics Concern  . Not on file   Social History Narrative   Lives alone.    Review of Systems: Constitutional: Negative for fever, chills, diaphoresis, activity change, appetite change and fatigue. HENT: Negative for ear pain, nosebleeds, congestion, facial swelling, rhinorrhea, neck pain, neck stiffness and ear discharge.  Eyes: Negative for pain, discharge, redness, itching and visual disturbance. Respiratory: Negative for  cough, choking, chest tightness, shortness of breath, wheezing and stridor.  Cardiovascular: Negative for chest pain, palpitations and leg swelling. Gastrointestinal: Negative for abdominal distention. Genitourinary: Negative for dysuria, urgency, frequency, hematuria, flank pain, decreased urine volume, difficulty urinating and dyspareunia.  Musculoskeletal: see HPI Neurological: Negative for dizziness, tremors, seizures, syncope, facial asymmetry, speech difficulty, weakness, light-headedness, numbness and headaches.  Hematological: Negative for adenopathy. Does not bruise/bleed easily. Psychiatric/Behavioral: Negative for hallucinations, behavioral problems, confusion, dysphoric mood, decreased concentration and agitation.    Objective:   Filed Vitals:   10/15/14 1213  BP: 137/93  Pulse: 77  Temp: 97.4 F (36.3 C)  Resp: 16    Physical Exam: Constitutional: Patient appears well-developed and well-nourished. No distress. HENT: Normocephalic, atraumatic, External right and left ear normal. Oropharynx is clear and moist.  Eyes: Conjunctivae and EOM are normal. PERRLA, no scleral icterus. Neck: Normal ROM. Neck supple. No JVD. No tracheal deviation. No thyromegaly. CVS: RRR, S1/S2 +, no murmurs, no gallops, no carotid bruit, dorsalis pedis difficult to palpate Pulmonary: Effort and breath sounds normal, no stridor, rhonchi, wheezes, rales.  Abdominal: Soft. BS +, no distension, tenderness, rebound or guarding.  Musculoskeletal: Left 3rd toe amputation, edema of dorsum which is improving, wound VAC in place. Lymphadenopathy: No lymphadenopathy noted, cervical, inguinal or axillary Neuro: Alert. Normal reflexes, muscle tone coordination. No cranial nerve deficit. Skin: Skin is warm and dry. No rash noted. Not diaphoretic. No erythema. No pallor. Psychiatric: Normal mood and affect. Behavior, judgment, thought content normal.  Lab Results  Component Value Date   WBC 9.6 10/07/2014    HGB 15.0 10/07/2014   HCT 44.4 10/07/2014   MCV 89.9 10/07/2014   PLT 263 10/07/2014   Lab Results  Component Value Date   CREATININE 0.68 10/07/2014   BUN 6 10/07/2014   NA 133* 10/07/2014   K 4.1 10/07/2014   CL 99* 10/07/2014   CO2 28 10/07/2014    Lab Results  Component Value Date   HGBA1C 13.9* 10/01/2014   Lipid Panel     Component Value Date/Time   CHOL  04/10/2010 0315    192        ATP III CLASSIFICATION:  <200     mg/dL   Desirable  200-239  mg/dL   Borderline High  >=240    mg/dL   High          TRIG 161* 04/10/2010 0315   HDL 50 04/10/2010 0315   CHOLHDL 3.8 04/10/2010 0315   VLDL 32 04/10/2010 0315   LDLCALC * 04/10/2010 0315    110        Total Cholesterol/HDL:CHD Risk Coronary Heart Disease Risk Table  Men   Women  1/2 Average Risk   3.4   3.3  Average Risk       5.0   4.4  2 X Average Risk   9.6   7.1  3 X Average Risk  23.4   11.0        Use the calculated Patient Ratio above and the CHD Risk Table to determine the patient's CHD Risk.        ATP III CLASSIFICATION (LDL):  <100     mg/dL   Optimal  100-129  mg/dL   Near or Above                    Optimal  130-159  mg/dL   Borderline  160-189  mg/dL   High  >190     mg/dL   Very High        Assessment and plan:  54 year old male patient with a history of uncontrolled diabetes mellitus (A1c 13.9), hypertension, left third toe osteomyelitis status post amputation.  Cellulitis of the toe status post amputation: Wound VAC in place and no evidence of infection at this time. Home came place to assist with wound VAC. Continue antibiotics Advised to keep appointment with Dr. Erlinda Hong on 10/22/14  Uncontrolled type 2 diabetes mellitus: A1c 13.9, CABG 252 in the clinic today. Increase Lantus from 30units to 35 units We'll review his blood sugar log at his next office visit. Meanwhile advised on ADA diet, exercise as tolerated, weight loss  Hypertension: Controlled. I am  adding lisinopril for renal protection.   The patient was given clear instructions to go to ER or return to medical center if symptoms don't improve, worsen or new problems develop. The patient verbalized understanding. The patient was told to call to get lab results if they haven't heard anything in the next week.     This note has been created with Surveyor, quantity. Any transcriptional errors are unintentional.    Arnoldo Morale, MD. [facility] [phone] 10/15/2014, 12:18 PM

## 2014-10-15 NOTE — Patient Instructions (Signed)
Diabetes Mellitus and Food It is important for you to manage your blood sugar (glucose) level. Your blood glucose level can be greatly affected by what you eat. Eating healthier foods in the appropriate amounts throughout the day at about the same time each day will help you control your blood glucose level. It can also help slow or prevent worsening of your diabetes mellitus. Healthy eating may even help you improve the level of your blood pressure and reach or maintain a healthy weight.  HOW CAN FOOD AFFECT ME? Carbohydrates Carbohydrates affect your blood glucose level more than any other type of food. Your dietitian will help you determine how many carbohydrates to eat at each meal and teach you how to count carbohydrates. Counting carbohydrates is important to keep your blood glucose at a healthy level, especially if you are using insulin or taking certain medicines for diabetes mellitus. Alcohol Alcohol can cause sudden decreases in blood glucose (hypoglycemia), especially if you use insulin or take certain medicines for diabetes mellitus. Hypoglycemia can be a life-threatening condition. Symptoms of hypoglycemia (sleepiness, dizziness, and disorientation) are similar to symptoms of having too much alcohol.  If your health care provider has given you approval to drink alcohol, do so in moderation and use the following guidelines:  Women should not have more than one drink per day, and men should not have more than two drinks per day. One drink is equal to:  12 oz of beer.  5 oz of wine.  1 oz of hard liquor.  Do not drink on an empty stomach.  Keep yourself hydrated. Have water, diet soda, or unsweetened iced tea.  Regular soda, juice, and other mixers might contain a lot of carbohydrates and should be counted. WHAT FOODS ARE NOT RECOMMENDED? As you make food choices, it is important to remember that all foods are not the same. Some foods have fewer nutrients per serving than other  foods, even though they might have the same number of calories or carbohydrates. It is difficult to get your body what it needs when you eat foods with fewer nutrients. Examples of foods that you should avoid that are high in calories and carbohydrates but low in nutrients include:  Trans fats (most processed foods list trans fats on the Nutrition Facts label).  Regular soda.  Juice.  Candy.  Sweets, such as cake, pie, doughnuts, and cookies.  Fried foods. WHAT FOODS CAN I EAT? Have nutrient-rich foods, which will nourish your body and keep you healthy. The food you should eat also will depend on several factors, including:  The calories you need.  The medicines you take.  Your weight.  Your blood glucose level.  Your blood pressure level.  Your cholesterol level. You also should eat a variety of foods, including:  Protein, such as meat, poultry, fish, tofu, nuts, and seeds (lean animal proteins are best).  Fruits.  Vegetables.  Dairy products, such as milk, cheese, and yogurt (low fat is best).  Breads, grains, pasta, cereal, rice, and beans.  Fats such as olive oil, trans fat-free margarine, canola oil, avocado, and olives. DOES EVERYONE WITH DIABETES MELLITUS HAVE THE SAME MEAL PLAN? Because every person with diabetes mellitus is different, there is not one meal plan that works for everyone. It is very important that you meet with a dietitian who will help you create a meal plan that is just right for you. Document Released: 12/11/2004 Document Revised: 03/21/2013 Document Reviewed: 02/10/2013 Exit[employer] Patient Information 2015 [employer]. This   information is not intended to replace advice given to you by your health care provider. Make sure you discuss any questions you have with your health care provider.  

## 2014-10-15 NOTE — Progress Notes (Signed)
Patient here for follow-up after Left foot middle toe amputation with wound vac His HH RN comes to change dressing M,W,F and he sees his surgeon on Monday He states his pain is 5/10 and he is taking all medications including insulin CBG 252

## 2014-11-06 ENCOUNTER — Other Ambulatory Visit: Payer: Self-pay | Admitting: Family Medicine

## 2014-11-06 DIAGNOSIS — E118 Type 2 diabetes mellitus with unspecified complications: Principal | ICD-10-CM

## 2014-11-06 DIAGNOSIS — E1165 Type 2 diabetes mellitus with hyperglycemia: Secondary | ICD-10-CM

## 2014-11-06 DIAGNOSIS — IMO0002 Reserved for concepts with insufficient information to code with codable children: Secondary | ICD-10-CM

## 2014-11-06 MED ORDER — INSULIN PEN NEEDLE 31G X 8 MM MISC: 1.0000 | Freq: Three times a day (TID) | Status: DC

## 2014-11-06 MED ORDER — "INSULIN SYRINGE 29G X 1/2"" 0.5 ML MISC": 1.0000 | Freq: Three times a day (TID) | Status: DC

## 2014-11-08 ENCOUNTER — Other Ambulatory Visit: Payer: Self-pay

## 2014-11-08 DIAGNOSIS — I10 Essential (primary) hypertension: Secondary | ICD-10-CM

## 2014-11-08 MED ORDER — AMLODIPINE BESYLATE 5 MG PO TABS: 5.0000 mg | ORAL_TABLET | Freq: Every day | ORAL | Status: DC

## 2014-11-08 NOTE — Telephone Encounter (Signed)
Prescription printed instead of being sent electronically. Nurse sent prescription electronically.

## 2014-11-12 ENCOUNTER — Ambulatory Visit (HOSPITAL_BASED_OUTPATIENT_CLINIC_OR_DEPARTMENT_OTHER): Payer: Self-pay | Admitting: Family Medicine

## 2014-11-12 ENCOUNTER — Ambulatory Visit: Payer: Self-pay | Attending: Family Medicine

## 2014-11-12 ENCOUNTER — Other Ambulatory Visit: Payer: Self-pay

## 2014-11-12 ENCOUNTER — Other Ambulatory Visit: Payer: Self-pay | Admitting: Family Medicine

## 2014-11-12 ENCOUNTER — Encounter: Payer: Self-pay | Admitting: Family Medicine

## 2014-11-12 ENCOUNTER — Ambulatory Visit (HOSPITAL_COMMUNITY)
Admission: RE | Admit: 2014-11-12 | Discharge: 2014-11-12 | Disposition: A | Discharge status: Home / Self Care | Payer: Self-pay | Source: Ambulatory Visit | Attending: Cardiology | Admitting: Cardiology

## 2014-11-12 VITALS — BP 146/95 | HR 84 | Temp 98.0°F | Ht 74.0 in | Wt 282.6 lb

## 2014-11-12 DIAGNOSIS — M174 Other bilateral secondary osteoarthritis of knee: Secondary | ICD-10-CM

## 2014-11-12 DIAGNOSIS — E1165 Type 2 diabetes mellitus with hyperglycemia: Secondary | ICD-10-CM | POA: Insufficient documentation

## 2014-11-12 DIAGNOSIS — M869 Osteomyelitis, unspecified: Secondary | ICD-10-CM | POA: Insufficient documentation

## 2014-11-12 DIAGNOSIS — Z720 Tobacco use: Secondary | ICD-10-CM | POA: Insufficient documentation

## 2014-11-12 DIAGNOSIS — I10 Essential (primary) hypertension: Secondary | ICD-10-CM | POA: Insufficient documentation

## 2014-11-12 DIAGNOSIS — I498 Other specified cardiac arrhythmias: Secondary | ICD-10-CM | POA: Insufficient documentation

## 2014-11-12 DIAGNOSIS — M179 Osteoarthritis of knee, unspecified: Secondary | ICD-10-CM | POA: Insufficient documentation

## 2014-11-12 DIAGNOSIS — M1710 Unilateral primary osteoarthritis, unspecified knee: Secondary | ICD-10-CM | POA: Insufficient documentation

## 2014-11-12 DIAGNOSIS — Z794 Long term (current) use of insulin: Secondary | ICD-10-CM | POA: Insufficient documentation

## 2014-11-12 LAB — GLUCOSE, POCT (MANUAL RESULT ENTRY): POC Glucose: 170 mg/dl — AB (ref 70–99)

## 2014-11-12 MED ORDER — INSULIN PEN NEEDLE 31G X 8 MM MISC: 1.0000 | Freq: Three times a day (TID) | Status: DC

## 2014-11-12 MED ORDER — "INSULIN SYRINGE 29G X 1/2"" 0.5 ML MISC": 1.0000 | Freq: Three times a day (TID) | Status: DC

## 2014-11-12 MED ORDER — INSULIN ASPART 100 UNIT/ML ~~LOC~~ SOLN: 6.0000 [IU] | Freq: Three times a day (TID) | SUBCUTANEOUS | Status: DC

## 2014-11-12 MED ORDER — MELOXICAM 7.5 MG PO TABS: 7.5000 mg | ORAL_TABLET | Freq: Every day | ORAL | Status: DC

## 2014-11-12 MED ORDER — AMLODIPINE BESYLATE 5 MG PO TABS: 5.0000 mg | ORAL_TABLET | Freq: Every day | ORAL | Status: DC

## 2014-11-12 NOTE — Progress Notes (Signed)
Subjective:    Patient ID: Jeffery [name], male    DOB: 1960/10/14, 54 y.o.   MRN: [id number]  HPI Jeffery [name] is a 54 year old male with a history of uncontrolled diabetes mellitus, hypertension who presented with a toe infection for one month and had lanced the blister with a razor blade to get out the pus and had noticed have blackish discoloration of the toe which led him to come to the ED at [facility]; he had also not been under the care of any physician for the management of his diabetes.  He had an x-ray of the left foot which revealed findings in the left third 2 consistent with osteomyelitis and so he was taken to the operating room for amputation of the left third toe on 10/02/14 and a wound VAC was placed. Wound culture was positive for MSSA and he received IV vancomycin and Zosyn for 4 days, blood culture revealed no growth to date.  His condition stabilized and he was switched to oral Keflex and home health nurse arranged for wound VAC. For his diabetes, his Lantus was increased to 30 units daily and he was given a meal coverage of 6 units was advised to follow-up in the outpatient clinic.  Interval history: He reports doing well ; the wound vac is off and he has been to see his Orthopedic surgeon. Now complains of bilateral knee pain .  He is s/p R ACL repair 26 years ago; recently had aspiration of his R knee effusion and is requesting something for pain.  Reports his fasting blood sugars have been in the 146; dose of Lantus was increased at his last visit.. Patient has No headache, No chest pain, No abdominal pain - No Nausea, No Cough - SOB.  Past Medical History  Diagnosis Date  . Diabetes mellitus without complication   . Hypertension   . Rotator cuff disorder   . DDD (degenerative disc disease), lumbar     Past Surgical History  Procedure Laterality Date  . Knee arthroscopy w/ acl reconstruction Right   . Amputation Left 10/02/2014    Procedure: Left Third  toe amputation ;  Surgeon: [name], MD;  Location: [place];  Service: Orthopedics;  Laterality: Left;  Regular bed, wants to follow hip  . Application of wound vac Left 10/02/2014    Procedure: APPLICATION OF WOUND VAC;  Surgeon: [name], MD;  Location: [place];  Service: Orthopedics;  Laterality: Left;  . I&d extremity Left 10/05/2014    Procedure: IRRIGATION AND DEBRIDEMENT LEFT FOOT;  Surgeon: [name], MD;  Location: [place];  Service: Orthopedics;  Laterality: Left;    Social History   Social History  . Marital Status: Single    Spouse Name: N/A  . Number of Children: N/A  . Years of Education: N/A   Occupational History  . Landscaping & rental handyman    Social History Main Topics  . Smoking status: Current Some Day Smoker    Types: Cigarettes  . Smokeless tobacco: Not on file  . Alcohol Use: 0.0 oz/week    0 Standard drinks or equivalent per week     Comment: 2 days week goes to bar to have "a couple of beers so he can sleep"  . Drug Use: No  . Sexual Activity: Not on file   Other Topics Concern  . Not on file   Social History Narrative   Lives alone.    No Known Allergies  Review of Systems Constitutional: Negative for fever, chills, diaphoresis, activity change, appetite change and fatigue. HENT: Negative for ear pain, nosebleeds, congestion, facial swelling, rhinorrhea, neck pain, neck stiffness and ear discharge.  Eyes: Negative for pain, discharge, redness, itching and visual disturbance. Respiratory: Negative for cough, choking, chest tightness, shortness of breath, wheezing and stridor.  Cardiovascular: Negative for chest pain, palpitations and leg swelling. Gastrointestinal: Negative for abdominal distention. Genitourinary: Negative for dysuria, urgency, frequency, hematuria, flank pain, decreased urine volume, difficulty urinating and dyspareunia.  Musculoskeletal: see HPI Neurological: Negative for dizziness, tremors, seizures, syncope, facial  asymmetry, speech difficulty, weakness, light-headedness, numbness and headaches.  Hematological: Negative for adenopathy. Does not bruise/bleed easily. Psychiatric/Behavioral: Negative for hallucinations, behavioral problems, confusion, dysphoric mood, decrea    Objective: Filed Vitals:   11/12/14 1434  BP: 146/95  Pulse: 84  Temp: 98 F (36.7 C)  Height: 6\' 2"  (1.88 m)  Weight: 282 lb 9.6 oz (128.187 kg)  SpO2: 100%      Physical Exam Constitutional: Patient appears well-developed and well-nourished. No distress. HENT: Normocephalic, atraumatic, External right and left ear normal. Oropharynx is clear and moist.  Eyes: Conjunctivae and EOM are normal. PERRLA, no scleral icterus. Neck: Normal ROM. Neck supple. No JVD. No tracheal deviation. No thyromegaly. CVS: RRR, S1/S2 +, no murmurs, no gallops, no carotid bruit, dorsalis pedis difficult to palpate Pulmonary: Effort and breath sounds normal, no stridor, rhonchi, wheezes, rales.  Abdominal: Soft. BS +, no distension, tenderness, rebound or guarding.  Musculoskeletal: Amputated site is healing well, minimal discharge with foul-smelling odor. Right knee with vertical surgical healed scar; tenderness on range of motion, limited flexion; left is normal  Lymphadenopathy: No lymphadenopathy noted, cervical, inguinal or axillary Neuro: Alert. Normal reflexes, muscle tone coordination. No cranial nerve deficit. Skin: Skin is warm and dry. No rash noted. Not diaphoretic. No erythema. No pallor. Psychiatric: Normal mood and affect. Behavior, judgment, thought content normal.        Assessment & Plan:  54 year old male patient with a history of uncontrolled diabetes mellitus (A1c 13.9), hypertension, toe osteomyelitis status post amputation  Hypertension: Blood pressure is slightly above goal of less than 140/90 as he has only been taking lisinopril and has been out of amlodipine. Will check renal function today given he was commenced  on lisinopril at his last office visit. Advised to resume Amlodipine. BMET today to evaluate renal function.  Diabetes mellitus: Uncontrolled with Hbaic of 13.9, fasting sugars reveal improvement after Lantus dose was increased at his last visit.  Left toe osteomyelitis: Healing well Continue wound care; dressing change with wet to dry dressing performed in the clinic.  Bilateral knee pain: Likely from degenerative joint disease Placed on Mobic Continue current regimen

## 2014-11-12 NOTE — Progress Notes (Signed)
Patient here to follow up on his left toe amputation His pain is 5/10 intermittent pain and he is out of his hydrocodone that his surgeon gave him. He complains of bilateral knee pain that is chronic in nature constant 7/10 He says his Novolog was going to be $400 according to our pharmacy and they were working with him to get it for cheaper He says he goes to the bar 2 times per week for "a couple of beers" He states he drinks to help with his pain so that he can sleep at night RN palpates irregular pulse-per Dr Jarold Song EKG done

## 2014-11-13 LAB — BASIC METABOLIC PANEL
BUN: 12 mg/dL (ref 7–25)
CO2: 27 mmol/L (ref 20–31)
Calcium: 10.1 mg/dL (ref 8.6–10.3)
Chloride: 100 mmol/L (ref 98–110)
Creat: 0.85 mg/dL (ref 0.70–1.33)
Glucose, Bld: 114 mg/dL — ABNORMAL HIGH (ref 65–99)
Potassium: 5.1 mmol/L (ref 3.5–5.3)
Sodium: 141 mmol/L (ref 135–146)

## 2014-11-15 ENCOUNTER — Telehealth: Payer: Self-pay | Admitting: *Deleted

## 2014-11-15 NOTE — Telephone Encounter (Signed)
Verified name and date of birth and told patient his lab work was all normal.  Patient verbalized understanding and was appreciative of the call.

## 2014-11-15 NOTE — Telephone Encounter (Signed)
-----   Message from Arnoldo Morale, MD sent at 11/13/2014  4:33 PM EDT ----- Please inform the patient that labs are normal. Thank you.

## 2014-12-13 ENCOUNTER — Encounter: Payer: Self-pay | Admitting: Internal Medicine

## 2014-12-13 ENCOUNTER — Ambulatory Visit: Payer: Self-pay | Attending: Internal Medicine | Admitting: Internal Medicine

## 2014-12-13 VITALS — BP 131/84 | HR 84 | Temp 97.8°F | Resp 16 | Ht 74.0 in | Wt 286.8 lb

## 2014-12-13 DIAGNOSIS — I10 Essential (primary) hypertension: Secondary | ICD-10-CM | POA: Insufficient documentation

## 2014-12-13 DIAGNOSIS — Z1211 Encounter for screening for malignant neoplasm of colon: Secondary | ICD-10-CM | POA: Insufficient documentation

## 2014-12-13 DIAGNOSIS — Z794 Long term (current) use of insulin: Secondary | ICD-10-CM | POA: Insufficient documentation

## 2014-12-13 DIAGNOSIS — Z720 Tobacco use: Secondary | ICD-10-CM | POA: Insufficient documentation

## 2014-12-13 DIAGNOSIS — E119 Type 2 diabetes mellitus without complications: Secondary | ICD-10-CM | POA: Insufficient documentation

## 2014-12-13 LAB — GLUCOSE, POCT (MANUAL RESULT ENTRY): POC Glucose: 189 mg/dl — AB (ref 70–99)

## 2014-12-13 MED ORDER — INSULIN ASPART 100 UNIT/ML ~~LOC~~ SOLN: 6.0000 [IU] | Freq: Three times a day (TID) | SUBCUTANEOUS | Status: DC

## 2014-12-13 MED ORDER — AMLODIPINE BESYLATE 5 MG PO TABS: 5.0000 mg | ORAL_TABLET | Freq: Every day | ORAL | Status: DC

## 2014-12-13 MED ORDER — LISINOPRIL 5 MG PO TABS: 5.0000 mg | ORAL_TABLET | Freq: Every day | ORAL | Status: DC

## 2014-12-13 MED ORDER — INSULIN GLARGINE 100 UNIT/ML ~~LOC~~ SOLN: 35.0000 [IU] | Freq: Every day | SUBCUTANEOUS | Status: DC

## 2014-12-13 NOTE — Progress Notes (Signed)
Patient here for follow up on his diabetes  Does not need refills at this time Just had his medications refilled

## 2014-12-13 NOTE — Progress Notes (Signed)
Patient ID: Jeffery [name], male   DOB: 04-20-1960, 54 y.o.   MRN: [id number] SUBJECTIVE: 54 y.o. male for follow up of diabetes, HTN, recent toe amputation. Patient was seen here last month by Dr. Jarold Song for his diabetes and was found to have a A1C of 13.9%. He was increased to Lantus 35 units and now states that he has noticed improvements in his blood sugars. He only checks his sugars once per day and they have been in the low 100's. He states that his left toe amputation site is healing well and he has been cleared by his orthopedic surgeon.   Diabetic Review of Systems - diabetic diet compliance: compliant most of the time, further diabetic ROS: no polyuria or polydipsia, no chest pain, dyspnea or TIA's, no numbness, tingling or pain in extremities, no unusual visual symptoms, no hypoglycemia.  He has had his Tdap shot 4 years ago. He has never had a colonoscopy. Has never had eye exam. He notes that he has a follow up appointment with his surgeon for a possible knee replacement but has been told he has to have a lower A1C before surgery is done.  Current Outpatient Prescriptions  Medication Sig Dispense Refill  . amLODipine (NORVASC) 5 MG tablet Take 1 tablet (5 mg total) by mouth daily. 30 tablet 2  . insulin aspart (NOVOLOG) 100 UNIT/ML injection Inject 6 Units into the skin 3 (three) times daily with meals. 10 mL 11  . insulin glargine (LANTUS) 100 UNIT/ML injection Inject 0.35 mLs (35 Units total) into the skin daily. Please provide pen 10 mL 5  . lisinopril (PRINIVIL,ZESTRIL) 5 MG tablet Take 1 tablet (5 mg total) by mouth daily. 30 tablet 3  . meloxicam (MOBIC) 7.5 MG tablet Take 1 tablet (7.5 mg total) by mouth daily. 30 tablet 2  . Insulin Pen Needle (B-D ULTRAFINE III SHORT PEN) 31G X 8 MM MISC 1 each by Does not apply route 3 (three) times daily. 100 each 5  . INSULIN SYRINGE .5CC/29G 29G X 1/2" 0.5 ML MISC 1 each by Does not apply route 3 (three) times daily. 100 each 5   No current  facility-administered medications for this visit.    OBJECTIVE: Appearance: alert, well appearing, and in no distress, oriented to person, place, and time and overweight. BP 131/84 mmHg  Pulse 84  Temp(Src) 97.8 F (36.6 C)  Resp 16  Ht 6\' 2"  (1.88 m)  Wt 286 lb 12.8 oz (130.092 kg)  BMI 36.81 kg/m2  SpO2 100%  Exam: heart sounds normal rate, regular rhythm, normal S1, S2, no murmurs, rubs, clicks or gallops, no JVD, chest clear, no carotid bruits, feet: warm, good capillary refill, dry cracking heels, no trophic changes or ulcerative lesions, normal DP and PT pulses, reduced sensation at bilateral great toes and has a left second toe amputation that has healed well at surgical site.  ASSESSMENT: Diabetes Mellitus: improved, needs further observation and needs to follow diet more regularly HTN: Patient blood pressure is stable and may continue on current medication.  Education on diet, exercise, and modifiable risk factors discussed. Will obtain appropriate labs as needed. Will follow up in 3-6 months.  Colon cancer screening: I have placed order for Colonoscopy   PLAN: See orders for this visit as documented in the electronic medical record. Issues reviewed with him: diabetic diet discussed in detail, written exchange diet given, low cholesterol diet, weight control and daily exercise discussed, home glucose monitoring emphasized, foot care discussed and Podiatry  visits discussed, annual eye examinations at Ophthalmology discussed and long term diabetic complications discussed.  Return in about 3 months (around 03/14/2015) for DM follow up and 6 weeks lab--repeat A1c.    Lance Bosch, NP 12/14/2014 8:59 AM

## 2014-12-13 NOTE — Patient Instructions (Signed)
I will recheck your A1C in 6 weeks--so a lab appointment only and them me and you will follow up in 3 months. If I need to call you with changes after seeing your a1c I will let you know.

## 2014-12-14 ENCOUNTER — Other Ambulatory Visit: Payer: Self-pay | Admitting: Pharmacist

## 2014-12-14 LAB — MICROALBUMIN, URINE: Microalb, Ur: 0.7 mg/dL (ref <2.0)

## 2014-12-14 MED ORDER — "INSULIN SYRINGE-NEEDLE U-100 30G X 5/16"" 0.5 ML MISC": Status: DC

## 2015-01-24 ENCOUNTER — Ambulatory Visit: Payer: Self-pay | Attending: Internal Medicine

## 2015-01-24 DIAGNOSIS — E119 Type 2 diabetes mellitus without complications: Secondary | ICD-10-CM

## 2015-01-24 LAB — HEMOGLOBIN A1C
Hgb A1c MFr Bld: 7.6 % — ABNORMAL HIGH (ref <5.7)
Mean Plasma Glucose: 171 mg/dL — ABNORMAL HIGH (ref <117)

## 2015-02-07 ENCOUNTER — Telehealth: Payer: Self-pay | Admitting: General Practice

## 2015-02-07 NOTE — Telephone Encounter (Signed)
Patient left a vm wanting to obtain his results. Please follow up with pt. Thank you.

## 2015-02-08 ENCOUNTER — Telehealth: Payer: Self-pay

## 2015-02-08 NOTE — Telephone Encounter (Signed)
Returned patient phone call Patient not available Left message on voice mail to return our call 

## 2015-02-11 ENCOUNTER — Other Ambulatory Visit: Payer: Self-pay | Admitting: Family Medicine

## 2015-02-11 NOTE — Telephone Encounter (Signed)
Patient left a vm on medical records requesting results. Please follow up with pt. Thank you.

## 2015-02-14 ENCOUNTER — Encounter: Payer: Self-pay | Admitting: Internal Medicine

## 2015-02-19 ENCOUNTER — Telehealth: Payer: Self-pay

## 2015-02-19 NOTE — Telephone Encounter (Signed)
-----   Message from Lance Bosch, NP sent at 02/18/2015  5:18 PM EST ----- A1C is down to 7.6!!!! [place] job

## 2015-02-19 NOTE — Telephone Encounter (Signed)
Spoke with patient and he is aware of his lab results 

## 2015-03-13 ENCOUNTER — Other Ambulatory Visit (HOSPITAL_COMMUNITY): Payer: Self-pay | Admitting: Orthopaedic Surgery

## 2015-03-18 ENCOUNTER — Encounter (HOSPITAL_COMMUNITY)
Admission: RE | Admit: 2015-03-18 | Discharge: 2015-03-18 | Disposition: A | Discharge status: Home / Self Care | Payer: Self-pay | Source: Ambulatory Visit | Attending: Orthopaedic Surgery | Admitting: Orthopaedic Surgery

## 2015-03-18 ENCOUNTER — Encounter (HOSPITAL_COMMUNITY): Payer: Self-pay

## 2015-03-18 DIAGNOSIS — Z01812 Encounter for preprocedural laboratory examination: Secondary | ICD-10-CM | POA: Insufficient documentation

## 2015-03-18 DIAGNOSIS — M1711 Unilateral primary osteoarthritis, right knee: Secondary | ICD-10-CM | POA: Insufficient documentation

## 2015-03-18 DIAGNOSIS — Z0183 Encounter for blood typing: Secondary | ICD-10-CM | POA: Insufficient documentation

## 2015-03-18 LAB — URINALYSIS, ROUTINE W REFLEX MICROSCOPIC
Bilirubin Urine: NEGATIVE
Glucose, UA: >1000 mg/dL — AB
Hgb urine dipstick: NEGATIVE
Ketones, ur: NEGATIVE mg/dL
Leukocytes, UA: NEGATIVE
Nitrite: NEGATIVE
Protein, ur: NEGATIVE mg/dL
Specific Gravity, Urine: 1.022 (ref 1.005–1.030)
pH: 6 (ref 5.0–8.0)

## 2015-03-18 LAB — COMPREHENSIVE METABOLIC PANEL
ALT: 28 U/L (ref 17–63)
AST: 24 U/L (ref 15–41)
Albumin: 3.8 g/dL (ref 3.5–5.0)
Alkaline Phosphatase: 71 U/L (ref 38–126)
Anion gap: 8 (ref 5–15)
BUN: 11 mg/dL (ref 6–20)
CO2: 25 mmol/L (ref 22–32)
Calcium: 9.6 mg/dL (ref 8.9–10.3)
Chloride: 103 mmol/L (ref 101–111)
Creatinine, Ser: 0.79 mg/dL (ref 0.61–1.24)
GFR calc Af Amer: >60 mL/min (ref >60)
GFR calc non Af Amer: >60 mL/min (ref >60)
Glucose, Bld: 231 mg/dL — ABNORMAL HIGH (ref 65–99)
Potassium: 4.1 mmol/L (ref 3.5–5.1)
Sodium: 136 mmol/L (ref 135–145)
Total Bilirubin: 0.4 mg/dL (ref 0.3–1.2)
Total Protein: 6.6 g/dL (ref 6.5–8.1)

## 2015-03-18 LAB — URINE MICROSCOPIC-ADD ON

## 2015-03-18 LAB — PROTIME-INR
INR: 1.01 (ref 0.00–1.49)
Prothrombin Time: 13.5 seconds (ref 11.6–15.2)

## 2015-03-18 LAB — CBC WITH DIFFERENTIAL/PLATELET
Basophils Absolute: 0.1 10*3/uL (ref 0.0–0.1)
Basophils Relative: 1 %
Eosinophils Absolute: 0.4 10*3/uL (ref 0.0–0.7)
Eosinophils Relative: 5 %
HCT: 46 % (ref 39.0–52.0)
Hemoglobin: 15.1 g/dL (ref 13.0–17.0)
Lymphocytes Relative: 20 %
Lymphs Abs: 1.7 10*3/uL (ref 0.7–4.0)
MCH: 29.8 pg (ref 26.0–34.0)
MCHC: 32.8 g/dL (ref 30.0–36.0)
MCV: 90.9 fL (ref 78.0–100.0)
Monocytes Absolute: 0.7 10*3/uL (ref 0.1–1.0)
Monocytes Relative: 8 %
Neutro Abs: 5.8 10*3/uL (ref 1.7–7.7)
Neutrophils Relative %: 66 %
Platelets: 199 10*3/uL (ref 150–400)
RBC: 5.06 MIL/uL (ref 4.22–5.81)
RDW: 13 % (ref 11.5–15.5)
WBC: 8.6 10*3/uL (ref 4.0–10.5)

## 2015-03-18 LAB — SEDIMENTATION RATE: Sed Rate: 1 mm/hr (ref 0–16)

## 2015-03-18 LAB — SURGICAL PCR SCREEN
MRSA, PCR: NEGATIVE
Staphylococcus aureus: NEGATIVE

## 2015-03-18 LAB — TYPE AND SCREEN
ABO/RH(D): O NEG
Antibody Screen: NEGATIVE

## 2015-03-18 LAB — GLUCOSE, CAPILLARY: Glucose-Capillary: 225 mg/dL — ABNORMAL HIGH (ref 65–99)

## 2015-03-18 LAB — ABO/RH: ABO/RH(D): O NEG

## 2015-03-18 LAB — APTT: aPTT: 27 seconds (ref 24–37)

## 2015-03-18 LAB — C-REACTIVE PROTEIN: CRP: 0.6 mg/dL (ref <1.0)

## 2015-03-18 NOTE — Progress Notes (Signed)
   03/18/15 1033  OBSTRUCTIVE SLEEP APNEA  Have you ever been diagnosed with sleep apnea through a sleep study? No  Do you snore loudly (loud enough to be heard through closed doors)?  0  Do you often feel tired, fatigued, or sleepy during the daytime (such as falling asleep during driving or talking to someone)? 0  Has anyone observed you stop breathing during your sleep? 1 (once 5 yrs ago lives alone)  Do you have, or are you being treated for high blood pressure? 1  BMI more than 35 kg/m2? 1  Age > 50 (1-yes) 1  Neck circumference greater than:Male 16 inches or larger, Male 17inches or larger? 1 (77)  Male Gender (Yes=1) 1  Obstructive Sleep Apnea Score 6  Score 5 or greater  Results sent to PCP

## 2015-03-18 NOTE — Pre-Procedure Instructions (Addendum)
Jeffery [name]  03/18/2015      [id number] - [place], [place] [address] [place] [address] Phone: [phone] Fax: [place], [place] [address] Phone: [phone] Fax: [phone]    Your procedure is scheduled on 03/28/15  Report to [facility] Admitting at 1015 A.M.  Call this number if you have problems the morning of surgery:  [phone]   Remember:  Do not eat food or drink liquids after midnight.  Take these medicines the morning of surgery with A SIP OF WATER amlodipine  STOP all herbel meds, nsaids (aleve,naproxen,advil,ibuprofen) 5 days prior to surgery starting 03/21/15 including vitamins, aspirin  How to Manage Your Diabetes Before Surgery   Why is it important to control my blood sugar before and after surgery?   Improving blood sugar levels before and after surgery helps healing and can limit problems.  A way of improving blood sugar control is eating a healthy diet by:  - Eating less sugar and carbohydrates  - Increasing activity/exercise  - Talk with your doctor about reaching your blood sugar goals  High blood sugars (greater than 180 mg/dL) can raise your risk of infections and slow down your recovery so you will need to focus on controlling your diabetes during the weeks before surgery.  Make sure that the doctor who takes care of your diabetes knows about your planned surgery including the date and location.  How do I manage my blood sugars before surgery?   Check your blood sugar at least 4 times a day, 2 days before surgery to make sure that they are not too high or low.   Check your blood sugar the morning of your surgery when you wake up and every 2               hours until you get to the Short-Stay unit.  If your blood sugar is less than 70 mg/dL, you  will need to treat for low blood sugar by:  Treat a low blood sugar (less than 70 mg/dL) with 1/2 cup of clear juice (cranberry or apple), 4 glucose tablets, OR glucose gel.  Recheck blood sugar in 15 minutes after treatment (to make sure it is greater than 70 mg/dL).  If blood sugar is not greater than 70 mg/dL on re-check, call [phone] for further instructions.   Report your blood sugar to the Short-Stay nurse when you get to Short-Stay.  References:  [facility], 2007 "How to Manage your Diabetes Before and After Surgery".  What do I do about my diabetes medications?     THE NIGHT BEFORE SURGERY, take 28 units of lantus Insulin.     THE MORNING OF SURGERY, do not take  novolog Insulin.   If your CBG is greater than 220 mg/dL, you may take 1/2 of your sliding scale (correction) dose of insulin.   Do not wear jewelry, make-up or nail polish.  Do not wear lotions, powders, or perfumes.  You may wear deodorant.  Do not shave 48 hours prior to surgery.  Men may shave face and neck.  Do not bring valuables to the hospital.  [facility] is not responsible for any belongings or valuables.  Contacts, dentures or bridgework may not be worn into surgery.  Leave your suitcase in the car.  After surgery  it may be brought to your room.  For patients admitted to the hospital, discharge time will be determined by your treatment team.  Patients discharged the day of surgery will not be allowed to drive home.   Name and phone number of your driver:    Special instructions:   Special Instructions: [facility] - Preparing for Surgery  Before surgery, you can play an important role.  Because skin is not sterile, your skin needs to be as free of germs as possible.  You can reduce the number of germs on you skin by washing with CHG (chlorahexidine gluconate) soap before surgery.  CHG is an antiseptic cleaner which kills germs and bonds with the skin to continue  killing germs even after washing.  Please DO NOT use if you have an allergy to CHG or antibacterial soaps.  If your skin becomes reddened/irritated stop using the CHG and inform your nurse when you arrive at Short Stay.  Do not shave (including legs and underarms) for at least 48 hours prior to the first CHG shower.  You may shave your face.  Please follow these instructions carefully:   1.  Shower with CHG Soap the night before surgery and the morning of Surgery.  2.  If you choose to wash your hair, wash your hair first as usual with your normal shampoo.  3.  After you shampoo, rinse your hair and body thoroughly to remove the Shampoo.  4.  Use CHG as you would any other liquid soap.  You can apply chg directly  to the skin and wash gently with scrungie or a clean washcloth.  5.  Apply the CHG Soap to your body ONLY FROM THE NECK DOWN.  Do not use on open wounds or open sores.  Avoid contact with your eyes ears, mouth and genitals (private parts).  Wash genitals (private parts)       with your normal soap.  6.  Wash thoroughly, paying special attention to the area where your surgery will be performed.  7.  Thoroughly rinse your body with warm water from the neck down.  8.  DO NOT shower/wash with your normal soap after using and rinsing off the CHG Soap.  9.  Pat yourself dry with a clean towel.            10.  Wear clean pajamas.            11.  Place clean sheets on your bed the night of your first shower and do not sleep with pets.  Day of Surgery  Do not apply any lotions/deodorants the morning of surgery.  Please wear clean clothes to the hospital/surgery center.  Please read over the following fact sheets that you were given. Pain Booklet, Coughing and Deep Breathing, Blood Transfusion Information, Total Joint Packet, MRSA Information and Surgical Site Infection Prevention

## 2015-03-19 LAB — HEMOGLOBIN A1C
Hgb A1c MFr Bld: 7.9 % — ABNORMAL HIGH (ref 4.8–5.6)
Mean Plasma Glucose: 180 mg/dL

## 2015-03-27 MED ORDER — CHLORHEXIDINE GLUCONATE 4 % EX LIQD: 60.0000 mL | Freq: Once | CUTANEOUS | Status: DC

## 2015-03-27 MED ORDER — BUPIVACAINE LIPOSOME 1.3 % IJ SUSP
20.0000 mL | Freq: Once | INTRAMUSCULAR | Status: AC
  Administered 2015-03-28: 20 mL
  Filled 2015-03-27: qty 20

## 2015-03-27 MED ORDER — TRANEXAMIC ACID 1000 MG/10ML IV SOLN
1000.0000 mg | INTRAVENOUS | Status: AC
  Administered 2015-03-28: 1000 mg via INTRAVENOUS
  Filled 2015-03-27: qty 10

## 2015-03-27 MED ORDER — DEXTROSE 5 % IV SOLN
3.0000 g | INTRAVENOUS | Status: AC
  Administered 2015-03-28: 3 g via INTRAVENOUS
  Filled 2015-03-27: qty 3000

## 2015-03-28 ENCOUNTER — Inpatient Hospital Stay (HOSPITAL_COMMUNITY)
Admission: RE | Admit: 2015-03-28 | Discharge: 2015-03-30 | DRG: 470 | Disposition: A | Discharge status: Home Health | Payer: Self-pay | Source: Ambulatory Visit | Attending: Orthopaedic Surgery | Admitting: Orthopaedic Surgery

## 2015-03-28 ENCOUNTER — Inpatient Hospital Stay (HOSPITAL_COMMUNITY): Payer: Self-pay

## 2015-03-28 ENCOUNTER — Encounter (HOSPITAL_COMMUNITY): Payer: Self-pay | Admitting: Anesthesiology

## 2015-03-28 ENCOUNTER — Inpatient Hospital Stay (HOSPITAL_COMMUNITY): Payer: Self-pay | Admitting: Anesthesiology

## 2015-03-28 ENCOUNTER — Encounter (HOSPITAL_COMMUNITY)
Admission: RE | Disposition: A | Discharge status: Home Health | Payer: Self-pay | Source: Ambulatory Visit | Attending: Orthopaedic Surgery

## 2015-03-28 DIAGNOSIS — F17210 Nicotine dependence, cigarettes, uncomplicated: Secondary | ICD-10-CM | POA: Diagnosis present

## 2015-03-28 DIAGNOSIS — M1711 Unilateral primary osteoarthritis, right knee: Principal | ICD-10-CM | POA: Diagnosis present

## 2015-03-28 DIAGNOSIS — Z96659 Presence of unspecified artificial knee joint: Secondary | ICD-10-CM

## 2015-03-28 DIAGNOSIS — M5136 Other intervertebral disc degeneration, lumbar region: Secondary | ICD-10-CM | POA: Diagnosis present

## 2015-03-28 DIAGNOSIS — D62 Acute posthemorrhagic anemia: Secondary | ICD-10-CM | POA: Diagnosis not present

## 2015-03-28 DIAGNOSIS — Z23 Encounter for immunization: Secondary | ICD-10-CM

## 2015-03-28 DIAGNOSIS — I10 Essential (primary) hypertension: Secondary | ICD-10-CM | POA: Diagnosis present

## 2015-03-28 DIAGNOSIS — Z96651 Presence of right artificial knee joint: Secondary | ICD-10-CM

## 2015-03-28 DIAGNOSIS — Z794 Long term (current) use of insulin: Secondary | ICD-10-CM

## 2015-03-28 DIAGNOSIS — E119 Type 2 diabetes mellitus without complications: Secondary | ICD-10-CM | POA: Diagnosis present

## 2015-03-28 HISTORY — PX: TOTAL KNEE ARTHROPLASTY: SHX125

## 2015-03-28 LAB — GLUCOSE, CAPILLARY
Glucose-Capillary: 193 mg/dL — ABNORMAL HIGH (ref 65–99)
Glucose-Capillary: 156 mg/dL — ABNORMAL HIGH (ref 65–99)
Glucose-Capillary: 209 mg/dL — ABNORMAL HIGH (ref 65–99)
Glucose-Capillary: 239 mg/dL — ABNORMAL HIGH (ref 65–99)
Glucose-Capillary: 198 mg/dL — ABNORMAL HIGH (ref 65–99)

## 2015-03-28 LAB — RAPID URINE DRUG SCREEN, HOSP PERFORMED
Amphetamines: NOT DETECTED
Barbiturates: NOT DETECTED
Benzodiazepines: NOT DETECTED
Cocaine: NOT DETECTED
Opiates: NOT DETECTED
Tetrahydrocannabinol: POSITIVE — AB

## 2015-03-28 SURGERY — ARTHROPLASTY, KNEE, TOTAL
Anesthesia: Regional | Laterality: Right

## 2015-03-28 MED ORDER — PHENOL 1.4 % MT LIQD
1.0000 | OROMUCOSAL | Status: DC | PRN
Start: 2015-03-28 — End: 2015-03-30

## 2015-03-28 MED ORDER — LIDOCAINE HCL 4 % EX SOLN
CUTANEOUS | Status: DC | PRN
  Administered 2015-03-28: 3 mL via TOPICAL

## 2015-03-28 MED ORDER — DIPHENHYDRAMINE HCL 12.5 MG/5ML PO ELIX: 25.0000 mg | ORAL_SOLUTION | ORAL | Status: DC | PRN

## 2015-03-28 MED ORDER — ONDANSETRON HCL 4 MG PO TABS: 4.0000 mg | ORAL_TABLET | Freq: Three times a day (TID) | ORAL | Status: DC | PRN

## 2015-03-28 MED ORDER — ALUM & MAG HYDROXIDE-SIMETH 200-200-20 MG/5ML PO SUSP: 30.0000 mL | ORAL | Status: DC | PRN

## 2015-03-28 MED ORDER — ROCURONIUM BROMIDE 100 MG/10ML IV SOLN
INTRAVENOUS | Status: DC | PRN
  Administered 2015-03-28: 50 mg via INTRAVENOUS

## 2015-03-28 MED ORDER — MIDAZOLAM HCL 5 MG/5ML IJ SOLN
INTRAMUSCULAR | Status: DC | PRN
  Administered 2015-03-28: 2 mg via INTRAVENOUS

## 2015-03-28 MED ORDER — OXYCODONE HCL 5 MG PO TABS
5.0000 mg | ORAL_TABLET | ORAL | Status: DC | PRN
  Administered 2015-03-28 – 2015-03-30 (×10): 15 mg via ORAL
  Administered 2015-03-30: 10 mg via ORAL
  Filled 2015-03-28 (×5): qty 3
  Filled 2015-03-28: qty 2
  Filled 2015-03-28 (×4): qty 3

## 2015-03-28 MED ORDER — DEXMEDETOMIDINE HCL 200 MCG/2ML IV SOLN
INTRAVENOUS | Status: DC | PRN
  Administered 2015-03-28 (×2): 8 ug via INTRAVENOUS
  Administered 2015-03-28 (×2): 4 ug via INTRAVENOUS
  Administered 2015-03-28 (×2): 8 ug via INTRAVENOUS

## 2015-03-28 MED ORDER — OXYCODONE HCL 5 MG/5ML PO SOLN: 5.0000 mg | Freq: Once | ORAL | Status: AC | PRN

## 2015-03-28 MED ORDER — LISINOPRIL 5 MG PO TABS
5.0000 mg | ORAL_TABLET | Freq: Every day | ORAL | Status: DC
  Administered 2015-03-28 – 2015-03-30 (×3): 5 mg via ORAL
  Filled 2015-03-28 (×3): qty 1

## 2015-03-28 MED ORDER — LACTATED RINGERS IV SOLN
INTRAVENOUS | Status: DC
  Administered 2015-03-28: 11:00:00 via INTRAVENOUS

## 2015-03-28 MED ORDER — FENTANYL CITRATE (PF) 250 MCG/5ML IJ SOLN
INTRAMUSCULAR | Status: AC
  Filled 2015-03-28: qty 5

## 2015-03-28 MED ORDER — PROPOFOL 10 MG/ML IV BOLUS
INTRAVENOUS | Status: AC
  Filled 2015-03-28: qty 20

## 2015-03-28 MED ORDER — PROMETHAZINE HCL 25 MG/ML IJ SOLN
6.2500 mg | INTRAMUSCULAR | Status: DC | PRN
  Administered 2015-03-28: 12.5 mg via INTRAVENOUS

## 2015-03-28 MED ORDER — ACETAMINOPHEN 500 MG PO TABS
1000.0000 mg | ORAL_TABLET | Freq: Four times a day (QID) | ORAL | Status: AC
  Administered 2015-03-28 – 2015-03-29 (×4): 1000 mg via ORAL
  Filled 2015-03-28 (×4): qty 2

## 2015-03-28 MED ORDER — FENTANYL CITRATE (PF) 100 MCG/2ML IJ SOLN
INTRAMUSCULAR | Status: DC | PRN
  Administered 2015-03-28: 25 ug via INTRAVENOUS
  Administered 2015-03-28 (×4): 50 ug via INTRAVENOUS
  Administered 2015-03-28: 25 ug via INTRAVENOUS
  Administered 2015-03-28: 50 ug via INTRAVENOUS
  Administered 2015-03-28: 25 ug via INTRAVENOUS
  Administered 2015-03-28: 100 ug via INTRAVENOUS
  Administered 2015-03-28 (×2): 50 ug via INTRAVENOUS
  Administered 2015-03-28: 25 ug via INTRAVENOUS
  Administered 2015-03-28 (×2): 50 ug via INTRAVENOUS

## 2015-03-28 MED ORDER — METHOCARBAMOL 750 MG PO TABS: 750.0000 mg | ORAL_TABLET | Freq: Two times a day (BID) | ORAL | Status: DC | PRN

## 2015-03-28 MED ORDER — DEXTROSE 5 % IV SOLN: 500.0000 mg | Freq: Four times a day (QID) | INTRAVENOUS | Status: DC | PRN

## 2015-03-28 MED ORDER — ACETAMINOPHEN 650 MG RE SUPP: 650.0000 mg | Freq: Four times a day (QID) | RECTAL | Status: DC | PRN

## 2015-03-28 MED ORDER — PHENYLEPHRINE HCL 10 MG/ML IJ SOLN
INTRAMUSCULAR | Status: DC | PRN
  Administered 2015-03-28: 40 ug via INTRAVENOUS

## 2015-03-28 MED ORDER — SENNOSIDES-DOCUSATE SODIUM 8.6-50 MG PO TABS: 1.0000 | ORAL_TABLET | Freq: Every evening | ORAL | Status: DC | PRN

## 2015-03-28 MED ORDER — ONDANSETRON HCL 4 MG PO TABS: 4.0000 mg | ORAL_TABLET | Freq: Four times a day (QID) | ORAL | Status: DC | PRN

## 2015-03-28 MED ORDER — ONDANSETRON HCL 4 MG/2ML IJ SOLN
INTRAMUSCULAR | Status: DC | PRN
  Administered 2015-03-28: 4 mg via INTRAVENOUS

## 2015-03-28 MED ORDER — METOCLOPRAMIDE HCL 5 MG PO TABS: 5.0000 mg | ORAL_TABLET | Freq: Three times a day (TID) | ORAL | Status: DC | PRN

## 2015-03-28 MED ORDER — CELECOXIB 200 MG PO CAPS
200.0000 mg | ORAL_CAPSULE | Freq: Two times a day (BID) | ORAL | Status: DC
  Administered 2015-03-28 – 2015-03-30 (×4): 200 mg via ORAL
  Filled 2015-03-28 (×4): qty 1

## 2015-03-28 MED ORDER — PROPOFOL 10 MG/ML IV BOLUS
INTRAVENOUS | Status: DC | PRN
  Administered 2015-03-28: 200 mg via INTRAVENOUS

## 2015-03-28 MED ORDER — MENTHOL 3 MG MT LOZG: 1.0000 | LOZENGE | OROMUCOSAL | Status: DC | PRN

## 2015-03-28 MED ORDER — 0.9 % SODIUM CHLORIDE (POUR BTL) OPTIME
TOPICAL | Status: DC | PRN
  Administered 2015-03-28: 1000 mL

## 2015-03-28 MED ORDER — ONDANSETRON HCL 4 MG/2ML IJ SOLN: 4.0000 mg | Freq: Four times a day (QID) | INTRAMUSCULAR | Status: DC | PRN

## 2015-03-28 MED ORDER — INSULIN ASPART 100 UNIT/ML ~~LOC~~ SOLN
0.0000 [IU] | Freq: Three times a day (TID) | SUBCUTANEOUS | Status: DC
  Administered 2015-03-28: 7 [IU] via SUBCUTANEOUS
  Administered 2015-03-29 – 2015-03-30 (×5): 4 [IU] via SUBCUTANEOUS

## 2015-03-28 MED ORDER — MIDAZOLAM HCL 2 MG/2ML IJ SOLN
INTRAMUSCULAR | Status: AC
  Filled 2015-03-28: qty 2

## 2015-03-28 MED ORDER — PROMETHAZINE HCL 25 MG/ML IJ SOLN
INTRAMUSCULAR | Status: AC
  Filled 2015-03-28: qty 1

## 2015-03-28 MED ORDER — SUCCINYLCHOLINE CHLORIDE 20 MG/ML IJ SOLN
INTRAMUSCULAR | Status: DC | PRN
  Administered 2015-03-28: 120 mg via INTRAVENOUS

## 2015-03-28 MED ORDER — OXYCODONE HCL ER 10 MG PO T12A: 10.0000 mg | EXTENDED_RELEASE_TABLET | Freq: Two times a day (BID) | ORAL | Status: DC

## 2015-03-28 MED ORDER — HYDROMORPHONE HCL 1 MG/ML IJ SOLN
0.2500 mg | INTRAMUSCULAR | Status: DC | PRN
  Administered 2015-03-28 (×4): 0.5 mg via INTRAVENOUS

## 2015-03-28 MED ORDER — CEFAZOLIN SODIUM-DEXTROSE 2-3 GM-% IV SOLR
2.0000 g | Freq: Four times a day (QID) | INTRAVENOUS | Status: AC
  Administered 2015-03-28: 2 g via INTRAVENOUS
  Filled 2015-03-28 (×3): qty 50

## 2015-03-28 MED ORDER — LIDOCAINE HCL (CARDIAC) 20 MG/ML IV SOLN
INTRAVENOUS | Status: DC | PRN
  Administered 2015-03-28: 20 mg via INTRAVENOUS

## 2015-03-28 MED ORDER — METOCLOPRAMIDE HCL 5 MG/ML IJ SOLN: 5.0000 mg | Freq: Three times a day (TID) | INTRAMUSCULAR | Status: DC | PRN

## 2015-03-28 MED ORDER — ACETAMINOPHEN 325 MG PO TABS: 650.0000 mg | ORAL_TABLET | Freq: Four times a day (QID) | ORAL | Status: DC | PRN

## 2015-03-28 MED ORDER — METHOCARBAMOL 500 MG PO TABS
500.0000 mg | ORAL_TABLET | Freq: Four times a day (QID) | ORAL | Status: DC | PRN
  Administered 2015-03-28 – 2015-03-30 (×7): 500 mg via ORAL
  Filled 2015-03-28 (×6): qty 1

## 2015-03-28 MED ORDER — ROPIVACAINE HCL 5 MG/ML IJ SOLN
INTRAMUSCULAR | Status: DC | PRN
  Administered 2015-03-28: 20 mL via PERINEURAL

## 2015-03-28 MED ORDER — INFLUENZA VAC SPLIT QUAD 0.5 ML IM SUSY
0.5000 mL | PREFILLED_SYRINGE | INTRAMUSCULAR | Status: AC
  Administered 2015-03-29: 0.5 mL via INTRAMUSCULAR

## 2015-03-28 MED ORDER — OXYCODONE HCL 5 MG PO TABS
ORAL_TABLET | ORAL | Status: AC
  Filled 2015-03-28: qty 4

## 2015-03-28 MED ORDER — SODIUM CHLORIDE 0.9 % IV SOLN
INTRAVENOUS | Status: DC
  Administered 2015-03-28 – 2015-03-29 (×2): via INTRAVENOUS

## 2015-03-28 MED ORDER — OXYCODONE HCL 5 MG PO TABS: 5.0000 mg | ORAL_TABLET | ORAL | Status: DC | PRN

## 2015-03-28 MED ORDER — TRANEXAMIC ACID 1000 MG/10ML IV SOLN
1000.0000 mg | INTRAVENOUS | Status: AC
  Administered 2015-03-28: 1000 mg via TOPICAL
  Filled 2015-03-28: qty 10

## 2015-03-28 MED ORDER — ASPIRIN EC 325 MG PO TBEC: 325.0000 mg | DELAYED_RELEASE_TABLET | Freq: Two times a day (BID) | ORAL | Status: DC

## 2015-03-28 MED ORDER — MORPHINE SULFATE (PF) 2 MG/ML IV SOLN
1.0000 mg | INTRAVENOUS | Status: DC | PRN
  Administered 2015-03-29 – 2015-03-30 (×3): 1 mg via INTRAVENOUS
  Filled 2015-03-28 (×3): qty 1

## 2015-03-28 MED ORDER — OXYCODONE HCL ER 10 MG PO T12A
10.0000 mg | EXTENDED_RELEASE_TABLET | Freq: Two times a day (BID) | ORAL | Status: DC
  Administered 2015-03-28 – 2015-03-30 (×4): 10 mg via ORAL
  Filled 2015-03-28 (×4): qty 1

## 2015-03-28 MED ORDER — FENTANYL CITRATE (PF) 100 MCG/2ML IJ SOLN
INTRAMUSCULAR | Status: AC
  Filled 2015-03-28: qty 2

## 2015-03-28 MED ORDER — NEOSTIGMINE METHYLSULFATE 10 MG/10ML IV SOLN
INTRAVENOUS | Status: DC | PRN
  Administered 2015-03-28: 4 mg via INTRAVENOUS

## 2015-03-28 MED ORDER — AMLODIPINE BESYLATE 5 MG PO TABS
5.0000 mg | ORAL_TABLET | Freq: Every day | ORAL | Status: DC
  Administered 2015-03-29 – 2015-03-30 (×2): 5 mg via ORAL
  Filled 2015-03-28 (×2): qty 1

## 2015-03-28 MED ORDER — INSULIN GLARGINE 100 UNIT/ML ~~LOC~~ SOLN
35.0000 [IU] | Freq: Every day | SUBCUTANEOUS | Status: DC
  Administered 2015-03-28 – 2015-03-30 (×2): 35 [IU] via SUBCUTANEOUS
  Filled 2015-03-28 (×3): qty 0.35

## 2015-03-28 MED ORDER — HYDROMORPHONE HCL 1 MG/ML IJ SOLN
INTRAMUSCULAR | Status: AC
  Filled 2015-03-28: qty 1

## 2015-03-28 MED ORDER — INSULIN ASPART 100 UNIT/ML ~~LOC~~ SOLN
6.0000 [IU] | Freq: Three times a day (TID) | SUBCUTANEOUS | Status: DC
  Administered 2015-03-28 – 2015-03-30 (×6): 6 [IU] via SUBCUTANEOUS

## 2015-03-28 MED ORDER — INSULIN ASPART 100 UNIT/ML ~~LOC~~ SOLN: 0.0000 [IU] | Freq: Every day | SUBCUTANEOUS | Status: DC

## 2015-03-28 MED ORDER — GLYCOPYRROLATE 0.2 MG/ML IJ SOLN
INTRAMUSCULAR | Status: DC | PRN
  Administered 2015-03-28: 0.6 mg via INTRAVENOUS

## 2015-03-28 MED ORDER — LACTATED RINGERS IV SOLN
INTRAVENOUS | Status: DC | PRN
  Administered 2015-03-28 (×3): via INTRAVENOUS

## 2015-03-28 MED ORDER — OXYCODONE HCL 5 MG PO TABS
5.0000 mg | ORAL_TABLET | Freq: Once | ORAL | Status: AC | PRN
  Administered 2015-03-28: 5 mg via ORAL

## 2015-03-28 MED ORDER — ASPIRIN EC 325 MG PO TBEC
325.0000 mg | DELAYED_RELEASE_TABLET | Freq: Two times a day (BID) | ORAL | Status: DC
  Administered 2015-03-28 – 2015-03-30 (×4): 325 mg via ORAL
  Filled 2015-03-28 (×4): qty 1

## 2015-03-28 MED ORDER — METHOCARBAMOL 500 MG PO TABS
ORAL_TABLET | ORAL | Status: AC
  Filled 2015-03-28: qty 1

## 2015-03-28 SURGICAL SUPPLY — 62 items
ALCOHOL ISOPROPYL (RUBBING) (MISCELLANEOUS) ×3 IMPLANT
BANDAGE ELASTIC 6 VELCRO ST LF (GAUZE/BANDAGES/DRESSINGS) IMPLANT
BANDAGE ESMARK 6X9 LF (GAUZE/BANDAGES/DRESSINGS) ×1 IMPLANT
BLADE SAG 18X100X1.27 (BLADE) ×3 IMPLANT
BLADE SAW SGTL 13.0X1.19X90.0M (BLADE) ×3 IMPLANT
BNDG ESMARK 6X9 LF (GAUZE/BANDAGES/DRESSINGS) ×3
BONE CEMENT PALACOSE (Orthopedic Implant) ×6 IMPLANT
BOWL SMART MIX CTS (DISPOSABLE) ×3 IMPLANT
CAPT KNEE TOTAL 3 ×3 IMPLANT
CEMENT BONE PALACOSE (Orthopedic Implant) ×2 IMPLANT
COVER SURGICAL LIGHT HANDLE (MISCELLANEOUS) ×3 IMPLANT
CUFF TOURNIQUET SINGLE 34IN LL (TOURNIQUET CUFF) ×3 IMPLANT
CUFF TOURNIQUET SINGLE 44IN (TOURNIQUET CUFF) IMPLANT
DERMABOND ADVANCED (GAUZE/BANDAGES/DRESSINGS)
DERMABOND ADVANCED .7 DNX12 (GAUZE/BANDAGES/DRESSINGS) IMPLANT
DRAPE EXTREMITY T 121X128X90 (DRAPE) ×3 IMPLANT
DRAPE IMP U-DRAPE 54X76 (DRAPES) ×3 IMPLANT
DRAPE INCISE IOBAN 66X45 STRL (DRAPES) ×3 IMPLANT
DRAPE ORTHO SPLIT 77X108 STRL (DRAPES) ×4
DRAPE PROXIMA HALF (DRAPES) IMPLANT
DRAPE SURG 17X11 SM STRL (DRAPES) ×6 IMPLANT
DRAPE SURG ORHT 6 SPLT 77X108 (DRAPES) ×2 IMPLANT
DRSG AQUACEL AG ADV 3.5X14 (GAUZE/BANDAGES/DRESSINGS) ×3 IMPLANT
DRSG PAD ABDOMINAL 8X10 ST (GAUZE/BANDAGES/DRESSINGS) ×3 IMPLANT
DURAPREP 26ML APPLICATOR (WOUND CARE) ×6 IMPLANT
ELECT CAUTERY BLADE 6.4 (BLADE) ×3 IMPLANT
ELECT REM PT RETURN 9FT ADLT (ELECTROSURGICAL) ×3
ELECTRODE REM PT RTRN 9FT ADLT (ELECTROSURGICAL) ×1 IMPLANT
EVACUATOR 1/8 PVC DRAIN (DRAIN) IMPLANT
FACESHIELD WRAPAROUND (MASK) ×3 IMPLANT
GAUZE SPONGE 4X4 12PLY STRL (GAUZE/BANDAGES/DRESSINGS) IMPLANT
GAUZE XEROFORM 5X9 LF (GAUZE/BANDAGES/DRESSINGS) IMPLANT
GLOVE SURG SYN 7.5  E (GLOVE) ×4
GLOVE SURG SYN 7.5 E (GLOVE) ×2 IMPLANT
GOWN STRL REIN XL XLG (GOWN DISPOSABLE) ×9 IMPLANT
HANDPIECE INTERPULSE COAX TIP (DISPOSABLE) ×2
KIT BASIN OR (CUSTOM PROCEDURE TRAY) ×3 IMPLANT
KIT ROOM TURNOVER OR (KITS) ×3 IMPLANT
MANIFOLD NEPTUNE II (INSTRUMENTS) ×3 IMPLANT
NEEDLE SPNL 18GX3.5 QUINCKE PK (NEEDLE) IMPLANT
NS IRRIG 1000ML POUR BTL (IV SOLUTION) ×3 IMPLANT
PACK TOTAL JOINT (CUSTOM PROCEDURE TRAY) ×3 IMPLANT
PACK UNIVERSAL I (CUSTOM PROCEDURE TRAY) ×3 IMPLANT
PAD ARMBOARD 7.5X6 YLW CONV (MISCELLANEOUS) ×6 IMPLANT
PADDING CAST COTTON 6X4 STRL (CAST SUPPLIES) ×6 IMPLANT
PEN SKIN MARKING BROAD (MISCELLANEOUS) ×3 IMPLANT
SET HNDPC FAN SPRY TIP SCT (DISPOSABLE) ×1 IMPLANT
STAPLER VISISTAT 35W (STAPLE) IMPLANT
SUCTION FRAZIER TIP 10 FR DISP (SUCTIONS) ×3 IMPLANT
SUT ETHILON 2 0 FS 18 (SUTURE) ×6 IMPLANT
SUT VIC AB 0 CT1 27 (SUTURE) ×2
SUT VIC AB 0 CT1 27XBRD ANBCTR (SUTURE) ×1 IMPLANT
SUT VIC AB 1 CT1 27 (SUTURE) ×6
SUT VIC AB 1 CT1 27XBRD ANBCTR (SUTURE) ×3 IMPLANT
SUT VIC AB 1 CTX 27 (SUTURE) IMPLANT
SUT VIC AB 2-0 CT1 27 (SUTURE) ×6
SUT VIC AB 2-0 CT1 TAPERPNT 27 (SUTURE) ×3 IMPLANT
SYR 50ML LL SCALE MARK (SYRINGE) ×3 IMPLANT
TOWEL OR 17X24 6PK STRL BLUE (TOWEL DISPOSABLE) ×3 IMPLANT
TOWEL OR 17X26 10 PK STRL BLUE (TOWEL DISPOSABLE) ×3 IMPLANT
WATER STERILE IRR 1000ML POUR (IV SOLUTION) IMPLANT
WRAP KNEE MAXI GEL POST OP (GAUZE/BANDAGES/DRESSINGS) ×3 IMPLANT

## 2015-03-28 NOTE — Transfer of Care (Signed)
Immediate Anesthesia Transfer of Care Note  Patient: Jeffery [name]  Procedure(s) Performed: Procedure(s): RIGHT TOTAL KNEE ARTHROPLASTY (Right)  Patient Location: PACU  Anesthesia Type:GA combined with regional for post-op pain  Level of Consciousness: awake and alert   Airway & Oxygen Therapy: Patient Spontanous Breathing and Patient connected to nasal cannula oxygen  Post-op Assessment: Report given to RN, Post -op Vital signs reviewed and stable and Patient moving all extremities  Post vital signs: Reviewed and stable  Last Vitals:  Filed Vitals:   03/28/15 1026  BP: 148/104  Pulse: 83  Temp: 36.1 C  Resp: 20    Complications: No apparent anesthesia complications

## 2015-03-28 NOTE — Anesthesia Procedure Notes (Addendum)
Procedure Name: Intubation Date/Time: 03/28/2015 12:32 PM Performed by: Suzy Bouchard Pre-anesthesia Checklist: Patient identified, Emergency Drugs available, Suction available, Timeout performed and Patient being monitored Patient Re-evaluated:Patient Re-evaluated prior to inductionOxygen Delivery Method: Circle system utilized Preoxygenation: Pre-oxygenation with 100% oxygen Intubation Type: IV induction Ventilation: Mask ventilation without difficulty Laryngoscope Size: Miller and 2 Grade View: Grade I Tube type: Oral Laser Tube: Cuffed inflated with minimal occlusive pressure - saline Tube size: 7.5 mm Number of attempts: 1 Airway Equipment and Method: Stylet and LTA kit utilized Placement Confirmation: ETT inserted through vocal cords under direct vision,  positive ETCO2 and breath sounds checked- equal and bilateral Secured at: 22 cm Tube secured with: Tape Dental Injury: Teeth and Oropharynx as per pre-operative assessment    Anesthesia Regional Block:  Adductor canal block  Pre-Anesthetic Checklist: ,, timeout performed, Correct Patient, Correct Site, Correct Laterality, Correct Procedure, Correct Position, site marked, Risks and benefits discussed,  Surgical consent,  Pre-op evaluation,  At surgeon's request and post-op pain management  Laterality: Right  Prep: chloraprep       Needles:  Injection technique: Single-shot  Needle Type: Echogenic Needle     Needle Length: 9cm 9 cm Needle Gauge: 21 and 21 G    Additional Needles:  Procedures: ultrasound guided (picture in chart) Adductor canal block Narrative:  Start time: 03/28/2015 12:02 PM End time: 03/28/2015 12:02 PM Injection made incrementally with aspirations every 5 mL.  Performed by: Personally  Anesthesiologist: Suzette Battiest

## 2015-03-28 NOTE — Op Note (Signed)
Total Knee Arthroplasty Procedure Note [name] [id number] 03/28/2015   Preoperative diagnosis: Right knee osteoarthritis  Postoperative diagnosis:same  Operative procedure: Right total knee arthroplasty. CPT [id number]  Surgeon: N. Eduard Roux, MD  Assistants: Ky Barban, RNFA  Anesthesia: general, regional  Tourniquet time: less than 2 hrs  Implants used: Smith and Nephew Femur: Legion PS 7 Tibia:Gensis II 7 Patella: 32 mm Polyethylene: 9 mm  Indication: Jeffery [name] is a 54 y.o. year old male with a history of knee pain. Having failed conservative management, the patient elected to proceed with a total knee arthroplasty.  We have reviewed the risk and benefits of the surgery and they elected to proceed after voicing understanding.  Procedure:  After informed consent was obtained and understanding of the risk were voiced including but not limited to bleeding, infection, damage to surrounding structures including nerves and vessels, blood clots, leg length inequality and the failure to achieve desired results, the operative extremity was marked with verbal confirmation of the patient in the holding area.   The patient was then brought to the operating room and transported to the operating room table in the supine position.  A tourniquet was applied to the operative extremity around the upper thigh. The operative limb was then prepped and draped in the usual sterile fashion and preoperative antibiotics were administered.  A time out was performed prior to the start of surgery confirming the correct extremity, preoperative antibiotic administration, as well as team members, implants and instruments available for the case. Correct surgical site was also confirmed with preoperative radiographs. The limb was then elevated for exsanguination and the tourniquet was inflated. A midline incision was made and a standard medial parapatellar approach was performed.  The patella  was prepared and sized to a 32 mm.  A cover was placed on the patella for protection from retractors.  We then turned our attention to the femur. Posterior cruciate ligament was sacrificed. Start site was drilled in the femur and the intramedullary distal femoral cutting guide was placed, set at 5 degrees valgus, taking 11 mm of distal resection. The distal cut was made. Osteophytes were then removed. Next, the proximal tibial cutting guide was placed with appropriate slope, varus/valgus alignment and depth of resection. The proximal tibial cut was made. Gap blocks were then used to assess the extension gap and alignment, and appropriate soft tissue releases were performed. Attention was turned back to the femur, which was sized using the sizing guide to a size 7. Appropriate rotation of the femoral component was determined using epicondylar axis, Whiteside's line, and assessing the flexion gap under ligament tension. The appropriate size 4-in-1 cutting block was placed and cuts were made. Posterior femoral osteophytes and uncapped bone were then removed with the curved osteotome. The tibia was sized for a size 7 component. The femoral box-cutting guide was placed and prepared for a PS femoral component. Trial components were placed, and stability was checked in full extension, mid-flexion, and deep flexion. Proper tibial rotation was determined and marked.  The patella tracked well without a lateral release. Trial components were then removed and tibial preparation performed. A posterior capsular injection comprising of 20 cc of 1.3% exparel and 40 cc of normal saline was performed for postoperative pain control. The bony surfaces were irrigated with a pulse lavage and then dried. Bone cement was vacuum mixed on the back table, and the final components sized above were cemented into place. After cement had finished curing, excess cement  was removed. The stability of the construct was re-evaluated throughout a  range of motion and found to be acceptable. The trial liner was removed, the knee was copiously irrigated, and the knee was re-evaluated for any excess bone debris. The real polyethylene liner, 9 mm thick, was inserted and checked to ensure the locking mechanism had engaged appropriately. The tourniquet was deflated and hemostasis was achieved. The wound was irrigated with dilute betadine in normal saline, and then again with normal saline. A drain was not placed. Capsular closure was performed with a #1 vicryl, subcutaneous fat closed with a 0 vicryl suture, then subcutaneous tissue closed with interrupted 2.0 vicryl suture. The skin was then closed with a 2.0 nylon. A sterile dressing was applied.   The patient was awakened in the operating room and taken to recovery in stable condition. All sponge, needle, and instrument counts were correct at the end of the case.  Position: supine  Complications: none.  Time Out: performed   Drains/Packing: none  Estimated blood loss: 100 cc  Returned to Recovery Room: in good condition.   Antibiotics: yes   Mechanical VTE (DVT) Prophylaxis: sequential compression devices, TED thigh-high  Chemical VTE (DVT) Prophylaxis: aspirin  Fluid Replacement  Crystalloid: see anesthesia record Blood: none  FFP: none   Specimens Removed: 1 to pathology   Sponge and Instrument Count Correct? yes   PACU: portable radiograph - knee AP and Lateral   Admission: inpatient status, start PT & OT POD#1  Plan/RTC: Return in 2 weeks for wound check.   Weight Bearing/Load Lower Extremity: full   N. Eduard Roux, MD [place] 2:42 PM

## 2015-03-28 NOTE — Anesthesia Preprocedure Evaluation (Addendum)
Anesthesia Evaluation  Patient identified by MRN, date of birth, ID band Patient awake    Reviewed: Allergy & Precautions, NPO status , Patient's Chart, lab work & pertinent test results  Airway Mallampati: II  TM Distance: <3 FB Neck ROM: Full    Dental  (+) Teeth Intact, Dental Advisory Given   Pulmonary Current Smoker,    breath sounds clear to auscultation       Cardiovascular hypertension, Pt. on medications  Rhythm:Regular Rate:Normal     Neuro/Psych negative neurological ROS     GI/Hepatic negative GI ROS, (+)     substance abuse  cocaine use and marijuana use,   Endo/Other  diabetes, Type 2, Insulin DependentMorbid obesity  Renal/GU negative Renal ROS     Musculoskeletal  (+) Arthritis ,   Abdominal   Peds  Hematology negative hematology ROS (+)   Anesthesia Other Findings   Reproductive/Obstetrics                           Anesthesia Physical Anesthesia Plan  ASA: II  Anesthesia Plan: General and Regional   Post-op Pain Management: GA combined w/ Regional for post-op pain   Induction: Intravenous  Airway Management Planned: Oral ETT  Additional Equipment:   Intra-op Plan:   Post-operative Plan: Extubation in OR  Informed Consent: I have reviewed the patients History and Physical, chart, labs and discussed the procedure including the risks, benefits and alternatives for the proposed anesthesia with the patient or authorized representative who has indicated his/her understanding and acceptance.   Dental advisory given  Plan Discussed with: CRNA  Anesthesia Plan Comments:         Anesthesia Quick Evaluation

## 2015-03-28 NOTE — Discharge Instructions (Signed)

## 2015-03-28 NOTE — H&P (Signed)
PREOPERATIVE H&P  Chief Complaint: Right knee degenerative joint disease  HPI: Jeffery [name] is a 54 y.o. male who presents for surgical treatment of Right knee degenerative joint disease.  He denies any changes in medical history.  Past Medical History  Diagnosis Date  . Diabetes mellitus without complication ([place])   . Hypertension   . Rotator cuff disorder   . DDD (degenerative disc disease), lumbar    Past Surgical History  Procedure Laterality Date  . Knee arthroscopy w/ acl reconstruction Right   . Amputation Left 10/02/2014    Procedure: Left Third toe amputation ;  Surgeon: [name], MD;  Location: [place];  Service: Orthopedics;  Laterality: Left;  Regular bed, wants to follow hip  . Application of wound vac Left 10/02/2014    Procedure: APPLICATION OF WOUND VAC; toe Surgeon: [name], MD;  Location: [place];  Service: Orthopedics;  Laterality: Left;  . I&d extremity Left 10/05/2014    Procedure: IRRIGATION AND DEBRIDEMENT LEFT FOOT;  Surgeon: [name], MD;  Location: [place];  Service: Orthopedics;  Laterality: Left;  . Anterior cruciate ligament repair Right 33    reconstruction   Social History   Social History  . Marital Status: Single    Spouse Name: N/A  . Number of Children: N/A  . Years of Education: N/A   Occupational History  . Landscaping & rental handyman    Social History Main Topics  . Smoking status: Current Some Day Smoker -- 0.25 packs/day for 38 years    Types: Cigarettes  . Smokeless tobacco: Not on file  . Alcohol Use: 0.0 oz/week    0 Standard drinks or equivalent per week     Comment: 2 -3days week goes to bar to have "a couple of beers so he can sleep"  . Drug Use: No  . Sexual Activity: Not on file   Other Topics Concern  . Not on file   Social History Narrative   Lives alone.   Family History  Problem Relation Age of Onset  . Diabetes Father   . Hypertension Father   . Heart failure Father    No Known  Allergies Prior to Admission medications   Medication Sig Start Date End Date Taking? Authorizing Provider  amLODipine (NORVASC) 5 MG tablet Take 1 tablet (5 mg total) by mouth daily. 12/13/14  Yes Lance Bosch, NP  insulin aspart (NOVOLOG) 100 UNIT/ML injection Inject 6 Units into the skin 3 (three) times daily with meals. 12/13/14  Yes Lance Bosch, NP  insulin glargine (LANTUS) 100 UNIT/ML injection Inject 0.35 mLs (35 Units total) into the skin daily. Please provide pen Patient taking differently: Inject 35 Units into the skin at bedtime. Please provide pen 12/13/14  Yes Lance Bosch, NP  Insulin Pen Needle (B-D ULTRAFINE III SHORT PEN) 31G X 8 MM MISC 1 each by Does not apply route 3 (three) times daily. 11/12/14  Yes Arnoldo Morale, MD  Insulin Syringe-Needle U-100 (TRUEPLUS INSULIN SYRINGE) 30G X 5/16" 0.5 ML MISC Use as directed 3 times daily 12/14/14  Yes Arnoldo Morale, MD  lisinopril (PRINIVIL,ZESTRIL) 5 MG tablet Take 1 tablet (5 mg total) by mouth daily. 12/13/14  Yes Lance Bosch, NP  ibuprofen (ADVIL,MOTRIN) 200 MG tablet Take 1,000 mg by mouth as needed.    Historical Provider, MD     Positive ROS: All other systems have been reviewed and were otherwise negative with the exception of those  mentioned in the HPI and as above.  Physical Exam: General: Alert, no acute distress Cardiovascular: No pedal edema Respiratory: No cyanosis, no use of accessory musculature GI: abdomen soft Skin: No lesions in the area of chief complaint Neurologic: Sensation intact distally Psychiatric: Patient is competent for consent with normal mood and affect Lymphatic: no lymphedema  MUSCULOSKELETAL: exam stable  Assessment: Right knee degenerative joint disease  Plan: Plan for Procedure(s): RIGHT TOTAL KNEE ARTHROPLASTY  The risks benefits and alternatives were discussed with the patient including but not limited to the risks of nonoperative treatment, versus surgical intervention including  infection, bleeding, nerve injury,  blood clots, cardiopulmonary complications, morbidity, mortality, among others, and they were willing to proceed.   Marianna Payment, MD   03/28/2015 7:02 AM

## 2015-03-28 NOTE — Progress Notes (Signed)
Orthopedic Tech Progress Note Patient Details:  Jeffery [name] 1960/04/12 [id number]  CPM Right Knee CPM Right Knee: On Right Knee Flexion (Degrees): 90 Right Knee Extension (Degrees): 0  Ortho Devices Ortho Device/Splint Location: applied ohf to bed Ortho Device/Splint Interventions: Ordered, Application   Braulio Bosch 03/28/2015, 3:55 PM

## 2015-03-29 ENCOUNTER — Encounter (HOSPITAL_COMMUNITY): Payer: Self-pay | Admitting: Orthopaedic Surgery

## 2015-03-29 LAB — GLUCOSE, CAPILLARY
Glucose-Capillary: 152 mg/dL — ABNORMAL HIGH (ref 65–99)
Glucose-Capillary: 181 mg/dL — ABNORMAL HIGH (ref 65–99)
Glucose-Capillary: 180 mg/dL — ABNORMAL HIGH (ref 65–99)
Glucose-Capillary: 164 mg/dL — ABNORMAL HIGH (ref 65–99)

## 2015-03-29 LAB — BASIC METABOLIC PANEL
Anion gap: 8 (ref 5–15)
BUN: 10 mg/dL (ref 6–20)
CO2: 27 mmol/L (ref 22–32)
Calcium: 9 mg/dL (ref 8.9–10.3)
Chloride: 100 mmol/L — ABNORMAL LOW (ref 101–111)
Creatinine, Ser: 0.83 mg/dL (ref 0.61–1.24)
GFR calc Af Amer: >60 mL/min (ref >60)
GFR calc non Af Amer: >60 mL/min (ref >60)
Glucose, Bld: 165 mg/dL — ABNORMAL HIGH (ref 65–99)
Potassium: 4.3 mmol/L (ref 3.5–5.1)
Sodium: 135 mmol/L (ref 135–145)

## 2015-03-29 LAB — CBC
HCT: 42.6 % (ref 39.0–52.0)
Hemoglobin: 14.2 g/dL (ref 13.0–17.0)
MCH: 30.1 pg (ref 26.0–34.0)
MCHC: 33.3 g/dL (ref 30.0–36.0)
MCV: 90.4 fL (ref 78.0–100.0)
Platelets: 193 10*3/uL (ref 150–400)
RBC: 4.71 MIL/uL (ref 4.22–5.81)
RDW: 13.1 % (ref 11.5–15.5)
WBC: 11 10*3/uL — ABNORMAL HIGH (ref 4.0–10.5)

## 2015-03-29 MED ORDER — CEFAZOLIN SODIUM-DEXTROSE 2-3 GM-% IV SOLR
2.0000 g | Freq: Once | INTRAVENOUS | Status: AC
  Administered 2015-03-29: 2 g via INTRAVENOUS
  Filled 2015-03-29 (×2): qty 50

## 2015-03-29 MED ORDER — KETOROLAC TROMETHAMINE 30 MG/ML IJ SOLN
30.0000 mg | Freq: Four times a day (QID) | INTRAMUSCULAR | Status: DC | PRN
  Administered 2015-03-29 – 2015-03-30 (×2): 30 mg via INTRAVENOUS
  Filled 2015-03-29 (×2): qty 1

## 2015-03-29 NOTE — Evaluation (Signed)
Physical Therapy Evaluation Patient Details Name: MANDEL COTUGNO MRN: [id number] DOB: 10-Mar-1961 Today's Date: 03/29/2015   History of Present Illness  54 y.o. male admitted to [facility] on 03/28/15 for elective R TKA.  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  Clinical Impression  Pt is moving well, supervision level with RW, cues for safety and to slow down.  Knee exercises initiated and precautions reviewed.  I anticipate pt will be able to d/c home with HHPT and family's assist at discharge.      Follow Up Recommendations Home health PT;Supervision for mobility/OOB    Equipment Recommendations  None recommended by PT    Recommendations for Other Services   NA    Precautions / Restrictions Precautions Precautions: Knee Precaution Booklet Issued: Yes (comment) Precaution Comments: hanout given and precautions reviewed Restrictions Weight Bearing Restrictions: Yes RLE Weight Bearing: Weight bearing as tolerated      Mobility  Bed Mobility Overal bed mobility: Modified Independent                Transfers Overall transfer level: Needs assistance Equipment used: Rolling walker (2 wheeled) Transfers: Sit to/from Stand Sit to Stand: Supervision         General transfer comment: supervision for safety, verbal cues for safe hand placement.   Ambulation/Gait Ambulation/Gait assistance: Supervision Ambulation Distance (Feet): 80 Feet Assistive device: Rolling walker (2 wheeled) Gait Pattern/deviations: Step-through pattern;Antalgic;Trunk flexed     General Gait Details: Verbal cues for upright posture, smaller steps and slower gait speed.  Cues also needed to stay closer to RW for support.          Balance Overall balance assessment: Needs assistance Sitting-balance support: Feet supported;No upper extremity supported Sitting balance-Leahy Scale: Good     Standing balance support: Bilateral upper  extremity supported;No upper extremity supported;Single extremity supported Standing balance-Leahy Scale: Fair                               Pertinent Vitals/Pain Pain Assessment: 0-10 Pain Score: 7  Pain Location: right knee Pain Descriptors / Indicators: Aching;Burning Pain Intervention(s): Limited activity within patient's tolerance;Monitored during session;Repositioned;Patient requesting pain meds-RN notified;RN gave pain meds during session    Home Living Family/patient expects to be discharged to:: Private residence               Home Equipment: Gilford Rile - 2 wheels;Bedside commode               Extremity/Trunk Assessment   Upper Extremity Assessment: Defer to OT evaluation           Lower Extremity Assessment: RLE deficits/detail RLE Deficits / Details: right leg with normal post op pain and weakness.  Pt with at least 3/5 ankle, 2/5 knee, 2+/5 hip flexion.     Cervical / Trunk Assessment: Other exceptions  Communication   Communication: No difficulties  Cognition Arousal/Alertness: Awake/alert Behavior During Therapy: WFL for tasks assessed/performed Overall Cognitive Status: Within Functional Limits for tasks assessed                         Exercises Total Joint Exercises Ankle Circles/Pumps: AROM;Both;10 reps Quad Sets: AROM;Right;10 reps Towel Squeeze: AROM;Both;10 reps Heel Slides: AAROM;Right;10 reps Goniometric ROM: 12-75      Assessment/Plan    PT Assessment Patient needs continued PT services  PT Diagnosis Difficulty walking;Abnormality of  gait;Generalized weakness;Acute pain   PT Problem List Decreased strength;Decreased range of motion;Decreased activity tolerance;Decreased balance;Decreased mobility;Decreased knowledge of use of DME;Decreased knowledge of precautions;Pain  PT Treatment Interventions DME instruction;Gait training;Stair training;Functional mobility training;Therapeutic activities;Balance  training;Therapeutic exercise;Neuromuscular re-education;Patient/family education;Manual techniques;Modalities   PT Goals (Current goals can be found in the Care Plan section) Acute Rehab PT Goals Patient Stated Goal: to go home tomorrow morning PT Goal Formulation: With patient Time For Goal Achievement: 04/05/15 Potential to Achieve Goals: Good    Frequency 7X/week           End of Session   Activity Tolerance: Patient limited by pain Patient left: in chair;with call bell/phone within reach           Time: [id number] PT Time Calculation (min) (ACUTE ONLY): 25 min   Charges:   PT Evaluation $Initial PT Evaluation Tier I: 1 Procedure PT Treatments $Gait Training: 8-22 mins        Kira Hartl B. Jessalynn Mccowan, PT, DPT [phone]   03/29/2015, 11:46 AM

## 2015-03-29 NOTE — Progress Notes (Signed)
Physical Therapy Treatment Patient Details Name: Jeffery [name] MRN: [id number] DOB: Oct 06, 1960 Today's Date: 03/29/2015    History of Present Illness 54 y.o. male admitted [facility] 03/28/15 for elective R TKA.  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    PT Comments    Pt is progressing well with mobility and gait. He was able to walk further into the hallway with a more normal gait pattern this PM.  We finished reviewing his HEP exercises and I reinforced no pillow under his operative knee.  PT will be by in AM to review stairs with pt before d/c.    Follow Up Recommendations  Home health PT;Supervision for mobility/OOB     Equipment Recommendations  None recommended by PT    Recommendations for Other Services   NA     Precautions / Restrictions Precautions Precautions: Knee Precaution Booklet Issued: Yes (comment) Precaution Comments: reinforced no pillow under operative knee Restrictions RLE Weight Bearing: Weight bearing as tolerated    Mobility  Bed Mobility Overal bed mobility: Modified Independent             General bed mobility comments: pt using hands to progress right leg over EOB.   Transfers Overall transfer level: Modified independent Equipment used: Rolling walker (2 wheeled) Transfers: Sit to/from Stand           General transfer comment: Mod I using hands for transitions, better control with descent to low chair this PM.  Verbal cues for safe RW use.   Ambulation/Gait Ambulation/Gait assistance: Supervision Ambulation Distance (Feet): 100 Feet Assistive device: Rolling walker (2 wheeled) Gait Pattern/deviations: Step-through pattern;Antalgic;Trunk flexed     General Gait Details: Pt with flexed trunk posture.  RW adjusted up for height, verbal cues for shorter steps and more upright trunk.  Started on the way back to his room cueing for heel-to-toe gait pattern.         Balance Overall balance assessment: Needs assistance Sitting-balance support: Feet supported;No upper extremity supported Sitting balance-Leahy Scale: Good     Standing balance support: No upper extremity supported;Bilateral upper extremity supported;Single extremity supported Standing balance-Leahy Scale: Fair                      Cognition Arousal/Alertness: Awake/alert Behavior During Therapy: WFL for tasks assessed/performed Overall Cognitive Status: Within Functional Limits for tasks assessed                      Exercises Total Joint Exercises Short Arc Quad: AROM;Right;10 reps Hip ABduction/ADduction: AROM;Right;10 reps Straight Leg Raises: AROM;Right;10 reps Long Arc Quad: AROM;Right;10 reps Knee Flexion: AROM;AAROM;Left;10 reps        Pertinent Vitals/Pain Pain Assessment: 0-10 Pain Score: 6  Pain Location: right knee Pain Descriptors / Indicators: Aching;Burning Pain Intervention(s): Limited activity within patient's tolerance;Monitored during session;Repositioned;Patient requesting pain meds-RN notified    Home Living   Living Arrangements: Spouse/significant other;Parent Available Help at Discharge: Family;Available 24 hours/day Type of Home: House Home Access: Stairs to enter Entrance Stairs-Rails: None Home Layout: Two level        Prior Function Level of Independence: Independent          PT Goals (current goals can now be found in the care plan section) Acute Rehab PT Goals Patient Stated Goal: to go home tomorrow morning Progress towards PT goals: Progressing toward goals    Frequency  7X/week  PT Plan Current plan remains appropriate       End of Session   Activity Tolerance: Patient limited by pain Patient left: in bed;with call bell/phone within reach;with family/visitor present     Time: [id number] PT Time Calculation (min) (ACUTE ONLY): 19 min  Charges:  $Therapeutic Exercise: 8-22 mins                       Cloie Wooden B. Ayaana Biondo, PT, DPT [phone]   03/29/2015, 3:45 PM

## 2015-03-29 NOTE — Progress Notes (Signed)
   Subjective:  Patient reports pain as moderate.    Objective:   VITALS:   Filed Vitals:   03/28/15 2300 03/29/15 0351 03/29/15 0623 03/29/15 1251  BP: 142/99 124/67 151/68 136/73  Pulse: 64 91 41 107  Temp: 97.6 F (36.4 C) 98 F (36.7 C) 98.7 F (37.1 C) 98.4 F (36.9 C)  TempSrc: Oral Oral Oral Oral  Resp: 8 16 12 18   Weight:      SpO2: 100% 98% 99% 96%    Neurologically intact Neurovascular intact Sensation intact distally Intact pulses distally Dorsiflexion/Plantar flexion intact Incision: dressing C/D/I and no drainage   Lab Results  Component Value Date   WBC 11.0* 03/29/2015   HGB 14.2 03/29/2015   HCT 42.6 03/29/2015   MCV 90.4 03/29/2015   PLT 193 03/29/2015     Assessment/Plan:  1 Day Post-Op   - Expected postop acute blood loss anemia - will monitor for symptoms - Up with PT/OT - doing well with PT, should clear tomorrow - DVT ppx - SCDs, ambulation, aspirin - WBAT operative extremity - Pain control - Discharge planning - home tomorrow after stair training    Marianna Payment 03/29/2015, 4:55 PM [phone]

## 2015-03-29 NOTE — Care Management Obs Status (Deleted)
[place] NOTIFICATION   Patient Details  Name: Jeffery [name] MRN: [id number] Date of Birth: [birth year]   Medicare Observation Status Notification Given:   Lady Deutscher, RN 03/29/2015, 10:52 AM

## 2015-03-29 NOTE — Care Management (Signed)
Utilization review completed. Joesphine Schemm, RN Case Manager [phone]. 

## 2015-03-29 NOTE — Anesthesia Postprocedure Evaluation (Signed)
Anesthesia Post Note  Patient: Jeffery [name]  Procedure(s) Performed: Procedure(s) (LRB): RIGHT TOTAL KNEE ARTHROPLASTY (Right)  Patient location during evaluation: PACU Anesthesia Type: Regional and General Level of consciousness: awake and alert Pain management: pain level controlled Vital Signs Assessment: post-procedure vital signs reviewed and stable Respiratory status: spontaneous breathing Cardiovascular status: blood pressure returned to baseline Anesthetic complications: no    Last Vitals:  Filed Vitals:   03/29/15 1251 03/29/15 2115  BP: 136/73 116/82  Pulse: 107 99  Temp: 36.9 C 37.2 C  Resp: 18 19    Last Pain:  Filed Vitals:   03/29/15 2115  PainSc: Veteran, [name] E

## 2015-03-29 NOTE — Care Management Note (Addendum)
Case Management Note  Patient Details  Name: MARCELLA BRASSARD MRN: [id number] Date of Birth: 1960/12/28  Subjective/Objective:  54 yr old male s/p right total knee arthroplasty. Patient has PMH of diabetes  Mellitus, Lumbar DDD. Arthritis.                  Action/Plan: Case manager spoke with patient concerning discharge plan and home health needs. Choice was offered. Case manager explained to patient that due to lack of insurance selection is limited. Referral was called to [facility].Patient states he has rolling walker, and 3in1. He states he lives with his mom and she and friends will assist him at discharge. No further needs identified.    Expected Discharge Date:   03/30/15               Expected Discharge Plan:  [employer]  In-House Referral:     Discharge planning Services  CM Consult  Post Acute Care Choice:  Home Health Choice offered to:  Patient  DME Arranged:  N/A DME Agency:  NA  HH Arranged:  PT [place] Agency:  [facility]  Status of Service:  Completed, signed off  Medicare Important Message Given:    Date Medicare IM Given:    Medicare IM give by:    Date Additional Medicare IM Given:    Additional Medicare Important Message give by:     If discussed at [place] of Stay Meetings, dates discussed:    Additional Comments:  Ninfa Meeker, RN 03/29/2015, 11:27 AM

## 2015-03-30 LAB — GLUCOSE, CAPILLARY
Glucose-Capillary: 178 mg/dL — ABNORMAL HIGH (ref 65–99)
Glucose-Capillary: 170 mg/dL — ABNORMAL HIGH (ref 65–99)

## 2015-03-30 LAB — CBC
HCT: 37.9 % — ABNORMAL LOW (ref 39.0–52.0)
Hemoglobin: 12.3 g/dL — ABNORMAL LOW (ref 13.0–17.0)
MCH: 29.5 pg (ref 26.0–34.0)
MCHC: 32.5 g/dL (ref 30.0–36.0)
MCV: 90.9 fL (ref 78.0–100.0)
Platelets: 175 10*3/uL (ref 150–400)
RBC: 4.17 MIL/uL — ABNORMAL LOW (ref 4.22–5.81)
RDW: 13.2 % (ref 11.5–15.5)
WBC: 11 10*3/uL — ABNORMAL HIGH (ref 4.0–10.5)

## 2015-03-30 NOTE — Discharge Summary (Signed)
Physician Discharge Summary      Patient ID: Jeffery [name] MRN: [id number] DOB/AGE: 1961/03/25 54 y.o.  Admit date: 03/28/2015 Discharge date: 03/30/2015  Admission Diagnoses:  <principal problem not specified>  Discharge Diagnoses:  Active Problems:   S/P total knee replacement using cement   Past Medical History  Diagnosis Date  . Diabetes mellitus without complication ([place])   . Hypertension   . Rotator cuff disorder   . DDD (degenerative disc disease), lumbar     Surgeries: Procedure(s): RIGHT TOTAL KNEE ARTHROPLASTY on 03/28/2015   Consultants (if any):    Discharged Condition: Improved  Hospital Course: Jeffery [name] is an 54 y.o. male who was admitted 03/28/2015 with a diagnosis of <principal problem not specified> and went to the operating room on 03/28/2015 and underwent the above named procedures.    He was given perioperative antibiotics:  Anti-infectives    Start     Dose/Rate Route Frequency Ordered Stop   03/29/15 0600  ceFAZolin (ANCEF) IVPB 2 g/50 mL premix     2 g 100 mL/hr over 30 Minutes Intravenous  Once 03/29/15 0132 03/29/15 0743   03/28/15 1715  ceFAZolin (ANCEF) IVPB 2 g/50 mL premix     2 g 100 mL/hr over 30 Minutes Intravenous Every 6 hours 03/28/15 1701 03/29/15 0514   03/28/15 1130  ceFAZolin (ANCEF) 3 g in dextrose 5 % 50 mL IVPB     3 g 160 mL/hr over 30 Minutes Intravenous To [facility] 03/27/15 0739 03/28/15 1228    .  He was given sequential compression devices, early ambulation, and aspirin for DVT prophylaxis.  He benefited maximally from the hospital stay and there were no complications.    Recent vital signs:  Filed Vitals:   03/29/15 2115 03/30/15 0644  BP: 116/82 119/66  Pulse: 99 64  Temp: 98.9 F (37.2 C) 98.2 F (36.8 C)  Resp: 19 18    Recent laboratory studies:  Lab Results  Component Value Date   HGB 12.3* 03/30/2015   HGB 14.2 03/29/2015   HGB 15.1 03/18/2015   Lab Results    Component Value Date   WBC 11.0* 03/30/2015   PLT 175 03/30/2015   Lab Results  Component Value Date   INR 1.01 03/18/2015   Lab Results  Component Value Date   NA 135 03/29/2015   K 4.3 03/29/2015   CL 100* 03/29/2015   CO2 27 03/29/2015   BUN 10 03/29/2015   CREATININE 0.83 03/29/2015   GLUCOSE 165* 03/29/2015    Discharge Medications:     Medication List    TAKE these medications        amLODipine 5 MG tablet  Commonly known as:  NORVASC  Take 1 tablet (5 mg total) by mouth daily.     aspirin EC 325 MG tablet  Take 1 tablet (325 mg total) by mouth 2 (two) times daily.     ibuprofen 200 MG tablet  Commonly known as:  ADVIL,MOTRIN  Take 1,000 mg by mouth as needed.     insulin aspart 100 UNIT/ML injection  Commonly known as:  novoLOG  Inject 6 Units into the skin 3 (three) times daily with meals.     insulin glargine 100 UNIT/ML injection  Commonly known as:  LANTUS  Inject 0.35 mLs (35 Units total) into the skin daily. Please provide pen     Insulin Pen Needle 31G X 8 MM Misc  Commonly known as:  B-D ULTRAFINE III SHORT PEN  1 each by Does not apply route 3 (three) times daily.     Insulin Syringe-Needle U-100 30G X 5/16" 0.5 ML Misc  Commonly known as:  TRUEPLUS INSULIN SYRINGE  Use as directed 3 times daily     lisinopril 5 MG tablet  Commonly known as:  PRINIVIL,ZESTRIL  Take 1 tablet (5 mg total) by mouth daily.     methocarbamol 750 MG tablet  Commonly known as:  ROBAXIN  Take 1 tablet (750 mg total) by mouth 2 (two) times daily as needed for muscle spasms.     ondansetron 4 MG tablet  Commonly known as:  ZOFRAN  Take 1-2 tablets (4-8 mg total) by mouth every 8 (eight) hours as needed for nausea or vomiting.     oxyCODONE 5 MG immediate release tablet  Commonly known as:  Oxy IR/ROXICODONE  Take 1-3 tablets (5-15 mg total) by mouth every 4 (four) hours as needed.     oxyCODONE 10 mg 12 hr tablet  Commonly known as:  OXYCONTIN  Take 1  tablet (10 mg total) by mouth every 12 (twelve) hours.     senna-docusate 8.6-50 MG tablet  Commonly known as:  SENOKOT S  Take 1 tablet by mouth at bedtime as needed.        Diagnostic Studies: Dg Knee Right Port  03/28/2015  CLINICAL DATA:  54 year old male status post right knee arthroplasty. Initial encounter. EXAM: PORTABLE RIGHT KNEE - 1-2 VIEW COMPARISON:  10/06/2014. FINDINGS: Sequelae of previous ACL reconstruction now with superimposed total knee arthroplasty changes. Portable AP and cross-table lateral views. Total joint hardware appears intact and normally aligned. Postoperative changes to the surrounding soft tissues with gas and fluid in the joint. No unexpected osseous changes. IMPRESSION: Interval right total knee arthroplasty with no adverse features. Electronically Signed   By: Genevie Ann M.D.   On: 03/28/2015 15:50    Disposition: 06-Home-Health Care Svc        Follow-up Information    Follow up with Marianna Payment, MD In 2 weeks.   Specialty:  Orthopedic Surgery   Why:  For suture removal, For wound re-check   Contact information:   [address] [place] [address] [phone]        Signed: Marianna Payment 03/30/2015, 9:55 AM

## 2015-03-30 NOTE — Progress Notes (Signed)
   Subjective:  Patient reports pain as moderate.    Objective:   VITALS:   Filed Vitals:   03/29/15 [id number] 03/29/15 1251 03/29/15 2115 03/30/15 0644  BP: 151/68 136/73 116/82 119/66  Pulse: 41 107 99 64  Temp: 98.7 F (37.1 C) 98.4 F (36.9 C) 98.9 F (37.2 C) 98.2 F (36.8 C)  TempSrc: Oral Oral Oral Oral  Resp: 12 18 19 18   Weight:      SpO2: 99% 96% 98% 95%    Neurologically intact Neurovascular intact Sensation intact distally Intact pulses distally Dorsiflexion/Plantar flexion intact Incision: dressing C/D/I and no drainage   Lab Results  Component Value Date   WBC 11.0* 03/30/2015   HGB 12.3* 03/30/2015   HCT 37.9* 03/30/2015   MCV 90.9 03/30/2015   PLT 175 03/30/2015     Assessment/Plan:  2 Days Post-Op   - Expected postop acute blood loss anemia - will monitor for symptoms - Up with PT/OT - doing well with PT - DVT ppx - SCDs, ambulation, aspirin - WBAT operative extremity - Pain control - Discharge planning - home today after stair training    Marianna Payment 03/30/2015, 10:16 AM [phone]

## 2015-03-30 NOTE — Evaluation (Signed)
Occupational Therapy Evaluation Patient Details Name: Jeffery [name] MRN: [id number] DOB: 04-01-1960 Today's Date: 03/30/2015    History of Present Illness 54 y.o. male admitted to [facility] on 03/28/15 for elective R TKA.  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   Clinical Impression   Pt admitted with above. Pt independent with ADLs, PTA. Feel pt will benefit from acute OT to increase independence and safety prior to d/c.     Follow Up Recommendations  No OT follow up;Supervision - Intermittent    Equipment Recommendations  Other (comment) (AE if wanted)    Recommendations for Other Services       Precautions / Restrictions Precautions Precautions: Knee;Fall Precaution Booklet Issued: No Precaution Comments: instructed on no twisting knee Restrictions Weight Bearing Restrictions: Yes RLE Weight Bearing: Weight bearing as tolerated      Mobility Bed Mobility Overal bed mobility: Needs Assistance Bed Mobility: Supine to Sit;Sit to Supine     Supine to sit: Min assist Sit to supine: Modified independent (Device/Increase time)   General bed mobility comments: assist with RLE. Suggested using sheet/blanket to assist leg.  Transfers Overall transfer level: Needs assistance   Transfers: Sit to/from Stand Sit to Stand: Supervision (OT retrieved RW as pt stood up without from chair)    Comments: Cue for technique.          Balance    Able to perform functional tasks while standing without LOB-RW in front of pt.                                        ADL Overall ADL's : Needs assistance/impaired                     Lower Body Dressing: Set up;Supervision/safety;Sit to/from stand;With adaptive equipment   Toilet Transfer: Ambulation;RW;Comfort height toilet (Supervision-Min guard for ambulation;Supervision to bathroom)   Toileting- Clothing Manipulation and Hygiene:  Supervision/safety (standing)   Tub/ Shower Transfer: Gaffer;Ambulation;Rolling walker;Min guard   Functional mobility during ADLs: Rolling walker (Setup/Supervision-Min guard) General ADL Comments: Educated on LB dressing technique. Educated on AE. Educated on shower transfer technique and option for tub as well. Recommended no stepping over tub any time soon. Pt plans to sponge bathe for now. Suggested using bag on walker.     Vision     Perception     Praxis      Pertinent Vitals/Pain Pain Assessment: 0-10 Pain Score: 6  Pain Location: right knee and thigh Pain Descriptors / Indicators: Sore Pain Intervention(s): Monitored during session     Hand Dominance     Extremity/Trunk Assessment Upper Extremity Assessment Upper Extremity Assessment: Overall WFL for tasks assessed   Lower Extremity Assessment Lower Extremity Assessment: Defer to PT evaluation       Communication Communication Communication: No difficulties   Cognition Arousal/Alertness: Awake/alert Behavior During Therapy: WFL for tasks assessed/performed Overall Cognitive Status: No family/caregiver present to determine baseline cognitive functioning (decreased safety awareness)                     General Comments       Exercises       Shoulder Instructions      Home Living Family/patient expects to be discharged to:: Private residence Living Arrangements: Spouse/significant other;Parent Available Help at Discharge: Family;Available 24 hours/day Type  of Home: House Home Access: Stairs to enter [employer] of Steps: 1 Entrance Stairs-Rails: None Home Layout: Two level Alternate Level Stairs-Number of Steps: 7 Alternate Level Stairs-Rails: Right Bathroom Shower/Tub: Tub/shower unit;Walk-in shower Shower/tub characteristics:  (door on walk-in shower)       Home Equipment: Walker - 2 wheels;Bedside commode;Adaptive equipment Adaptive Equipment: Reacher;Long-handled  shoe horn        Prior Functioning/Environment Level of Independence: Independent             OT Diagnosis: Acute pain   OT Problem List: Decreased safety awareness;Decreased knowledge of precautions;Pain;Obesity;Decreased activity tolerance;Decreased range of motion   OT Treatment/Interventions: Self-care/ADL training;DME and/or AE instruction;Therapeutic activities;Patient/family education;Balance training    OT Goals(Current goals can be found in the care plan section) Acute Rehab OT Goals Patient Stated Goal: not stated OT Goal Formulation: With patient Time For Goal Achievement: 04/06/15 Potential to Achieve Goals: Good ADL Goals Pt Will Perform Lower Body Dressing: with modified independence;sit to/from stand (including gathering items) Pt Will Transfer to Toilet: ambulating;with set-up (3 in 1 over commode) Additional ADL Goal #1: Pt will perform bed mobility at Mod I level with HOB flat and no rails.  OT Frequency: Min 2X/week   Barriers to D/C:            Co-evaluation              End of Session Equipment Utilized During Treatment: Gait belt;Rolling walker CPM Right Knee CPM Right Knee: Off  Activity Tolerance: Patient tolerated treatment well Patient left: in bed;with call bell/phone within reach;with bed alarm set   Time: [id number] OT Time Calculation (min): 20 min Charges:  OT General Charges $OT Visit: 1 Procedure OT Evaluation $Initial OT Evaluation Tier I: 1 Procedure G-CodesBenito Mccreedy OTR/L [id number] 03/30/2015, 9:26 AM

## 2015-03-30 NOTE — Progress Notes (Signed)
Physical Therapy Treatment Patient Details Name: Jeffery [name] MRN: [id number] DOB: [birth year] Today's Date: 03/30/2015    History of Present Illness 54 y.o. male admitted to [facility] on 03/28/15 for elective R TKA.  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    PT Comments    Patient progressing well with mobility, still with decreased safety awareness, although I suspect this is his baseline.  Recommend f/u PT at home to continue to progress mobility, strength and ROM.    Follow Up Recommendations  Home health PT;Supervision - Intermittent     Equipment Recommendations  None recommended by PT    Recommendations for Other Services       Precautions / Restrictions Precautions Precautions: Knee;Fall Precaution Booklet Issued: No Precaution Comments: instructed on no twisting knee Restrictions Weight Bearing Restrictions: Yes RLE Weight Bearing: Weight bearing as tolerated    Mobility  Bed Mobility Overal bed mobility: Modified Independent Bed Mobility: Supine to Sit;Sit to Supine     Supine to sit: Min assist Sit to supine: Modified independent (Device/Increase time)   General bed mobility comments: used railing to get sitting EOB  Transfers Overall transfer level: Modified independent Equipment used: Rolling walker (2 wheeled) Transfers: Sit to/from Stand Sit to Stand: Modified independent (Device/Increase time)            Ambulation/Gait Ambulation/Gait assistance: Modified independent (Device/Increase time) Ambulation Distance (Feet): 100 Feet Assistive device: Rolling walker (2 wheeled) Gait Pattern/deviations: Trunk flexed;Step-through pattern     General Gait Details: patient leaning forward during gait.  able to ambulate heel-to-toe gait pattern.     Stairs Stairs: Yes Stairs assistance: Modified independent (Device/Increase time) Stair Management: Two rails;Forwards Number of Stairs:  3    Wheelchair Mobility    Modified Rankin (Stroke Patients Only)       Balance                                    Cognition Arousal/Alertness: Awake/alert Behavior During Therapy: Impulsive Overall Cognitive Status: No family/caregiver present to determine baseline cognitive functioning (decreased safety awareness) Area of Impairment: Safety/judgement               General Comments: patient standing without walker in front of him;  impulsive during gait    Exercises Total Joint Exercises Ankle Circles/Pumps: AROM;Both;10 reps;Supine Quad Sets: AROM;Right;10 reps;Supine Towel Squeeze: AROM;Both;10 reps;Supine Short Arc Quad: AROM;Right;10 reps;Supine Heel Slides: AAROM;Right;10 reps;Supine Long Arc Quad: AROM;Right;10 reps;Seated Knee Flexion: AROM;AAROM;Left;10 reps;Seated Goniometric ROM: flexion = 78    General Comments        Pertinent Vitals/Pain Pain Assessment: 0-10 Pain Score: 4  Pain Location: right knee and thigh Pain Descriptors / Indicators: Aching;Sore;Tightness Pain Intervention(s): Limited activity within patient's tolerance;Monitored during session    [place] expects to be discharged to:: Private residence Living Arrangements: Spouse/significant other;Parent Available Help at Discharge: Family;Available 24 hours/day Type of Home: House Home Access: Stairs to enter Entrance Stairs-Rails: None Home Layout: Two level Home Equipment: Environmental consultant - 2 wheels;Bedside commode;Adaptive equipment      Prior Function Level of Independence: Independent          PT Goals (current goals can now be found in the care plan section) Acute Rehab PT Goals Patient Stated Goal: not stated Progress towards PT goals: Goals met/education completed, patient discharged from PT    Frequency  7X/week    PT Plan Current plan remains appropriate    Co-evaluation             End of Session   Activity Tolerance: Patient  tolerated treatment well Patient left: in bed;with call bell/phone within reach     Time: 1027-1050 PT Time Calculation (min) (ACUTE ONLY): 23 min  Charges:  $Gait Training: 8-22 mins $Therapeutic Exercise: 8-22 mins                    G Codes:      Shanna Cisco 04-04-2015, 11:01 AM 04-04-2015 [place], PT [phone]

## 2015-04-01 ENCOUNTER — Other Ambulatory Visit: Payer: Self-pay | Admitting: Family Medicine

## 2015-04-01 ENCOUNTER — Other Ambulatory Visit: Payer: Self-pay | Admitting: Internal Medicine

## 2015-04-01 MED FILL — MELOXICAM 7.5 MG TABLET: 7.5 | 30 days supply | Qty: 30 | Fill #1

## 2015-04-01 MED FILL — !NOVOLOG 100UNITS/ML VIAL: 100/ML | 28 days supply | Qty: 10 | Fill #3

## 2015-04-02 MED FILL — AMLODIPINE BESYLATE 5 MG TA: 5 | 30 days supply | Qty: 30 | Fill #0

## 2015-04-02 MED FILL — LISINOPRIL 5 MG TABLET: 5 | 30 days supply | Qty: 30 | Fill #0

## 2015-04-11 MED FILL — $LANTUS SOLOSTAR 100 UNITS/: 100 | 30 days supply | Qty: 9 | Fill #1

## 2015-04-17 ENCOUNTER — Ambulatory Visit: Payer: Self-pay | Attending: Internal Medicine

## 2015-04-17 ENCOUNTER — Ambulatory Visit: Payer: Self-pay

## 2015-07-29 ENCOUNTER — Encounter (HOSPITAL_COMMUNITY): Payer: Self-pay | Admitting: *Deleted

## 2015-07-29 ENCOUNTER — Ambulatory Visit (HOSPITAL_COMMUNITY)
Admission: EM | Admit: 2015-07-29 | Discharge: 2015-07-29 | Disposition: A | Discharge status: Home / Self Care | Payer: Self-pay | Attending: Emergency Medicine | Admitting: Emergency Medicine

## 2015-07-29 ENCOUNTER — Ambulatory Visit (INDEPENDENT_AMBULATORY_CARE_PROVIDER_SITE_OTHER): Payer: Self-pay

## 2015-07-29 DIAGNOSIS — R0781 Pleurodynia: Secondary | ICD-10-CM

## 2015-07-29 DIAGNOSIS — I159 Secondary hypertension, unspecified: Secondary | ICD-10-CM

## 2015-07-29 DIAGNOSIS — Z9114 Patient's other noncompliance with medication regimen: Secondary | ICD-10-CM

## 2015-07-29 MED ORDER — DICLOFENAC SODIUM 75 MG PO TBEC: 75.0000 mg | DELAYED_RELEASE_TABLET | Freq: Two times a day (BID) | ORAL | Status: DC

## 2015-07-29 MED ORDER — HYDROCODONE-ACETAMINOPHEN 7.5-325 MG PO TABS: 1.0000 | ORAL_TABLET | Freq: Four times a day (QID) | ORAL | Status: DC | PRN

## 2015-07-29 NOTE — ED Notes (Signed)
Patient reports waking up 2 weeks ago with right rib cage pain radiating around to spine. Denies any injury. Pain is constant with intermittent sharp pain. Pain increases with movement.

## 2015-07-29 NOTE — ED Provider Notes (Signed)
HPI  SUBJECTIVE:  Jeffery [name] is a 55 y.o. male who presents with right lower anterior rib pain for the past several weeks. Patient states it started in the back, now has localized in the front. He states that the pain is constant,alternating  Between sharp and dull. States that he woke up with this. He does report an increase in physical activity recently. He is unsure exactly what happened to his chest. symptoms are worse with torso rotation, bending forward, coughing, sneezing, laughing, no alleviating factors. He has tried ibuprofen, Norco, Aleve, Sportscreme.He denies trauma, fall, cough, rash, nausea, vomiting, fevers, bruising, hemoptysis. No chest pain, short of breath, pleuritic pain, abdominal pain. Pain is not associated with taking a deep breath in. He denies surgery in the past 4 weeks, immobilization. States this is identical to previous pain when he fractured his ribs before. Past medical history of diabetes, hypertension, smoker, chickenpox. No history of PE, DVT, MI, pneumonia, cancer, pneumothorax, asthma, emphysema, COPD, there anticoagulant or antiplatelet use. Patient states that he discontinue all of his high blood pressure medications about a week ago secondary to cough from lisinopril. PMD: [place].    Past Medical History  Diagnosis Date  . Diabetes mellitus without complication ([place])   . Hypertension   . Rotator cuff disorder   . DDD (degenerative disc disease), lumbar     Past Surgical History  Procedure Laterality Date  . Knee arthroscopy w/ acl reconstruction Right   . Amputation Left 10/02/2014    Procedure: Left Third toe amputation ;  Surgeon: [name], MD;  Location: [place];  Service: Orthopedics;  Laterality: Left;  Regular bed, wants to follow hip  . Application of wound vac Left 10/02/2014    Procedure: APPLICATION OF WOUND VAC; toe Surgeon: [name], MD;  Location: [place];  Service: Orthopedics;  Laterality: Left;  . I&d  extremity Left 10/05/2014    Procedure: IRRIGATION AND DEBRIDEMENT LEFT FOOT;  Surgeon: [name], MD;  Location: [place];  Service: Orthopedics;  Laterality: Left;  . Anterior cruciate ligament repair Right 3    reconstruction  . Total knee arthroplasty Right 03/28/2015  . Total knee arthroplasty Right 03/28/2015    Procedure: RIGHT TOTAL KNEE ARTHROPLASTY;  Surgeon: [name], MD;  Location: [place];  Service: Orthopedics;  Laterality: Right;    Family History  Problem Relation Age of Onset  . Diabetes Father   . Hypertension Father   . Heart failure Father     Social History  Substance Use Topics  . Smoking status: Current Some Day Smoker -- 0.25 packs/day for 38 years    Types: Cigarettes  . Smokeless tobacco: Never Used  . Alcohol Use: 0.0 oz/week    0 Standard drinks or equivalent per week     Comment: 2 -3days week goes to bar to have "a couple of beers so he can sleep"    No current facility-administered medications for this encounter.  Current outpatient prescriptions:  .  amLODipine (NORVASC) 5 MG tablet, TAKE 1 TABLET BY MOUTH DAILY., Disp: 30 tablet, Rfl: 2 .  aspirin EC 325 MG tablet, Take 1 tablet (325 mg total) by mouth 2 (two) times daily., Disp: 84 tablet, Rfl: 0 .  insulin aspart (NOVOLOG) 100 UNIT/ML injection, Inject 6 Units into the skin 3 (three) times daily with meals., Disp: 10 mL, Rfl: 11 .  insulin glargine (LANTUS) 100 UNIT/ML injection, Inject 0.35 mLs (35 Units total) into  the skin daily. Please provide pen (Patient taking differently: Inject 35 Units into the skin at bedtime. Please provide pen), Disp: 10 mL, Rfl: 5 .  Insulin Pen Needle (B-D ULTRAFINE III SHORT PEN) 31G X 8 MM MISC, 1 each by Does not apply route 3 (three) times daily., Disp: 100 each, Rfl: 5 .  Insulin Syringe-Needle U-100 (TRUEPLUS INSULIN SYRINGE) 30G X 5/16" 0.5 ML MISC, Use as directed 3 times daily, Disp: 100 each, Rfl: 5 .  diclofenac (VOLTAREN) 75 MG EC tablet, Take 1 tablet  (75 mg total) by mouth 2 (two) times daily. Take with food, Disp: 30 tablet, Rfl: 0 .  HYDROcodone-acetaminophen (NORCO) 7.5-325 MG tablet, Take 1 tablet by mouth every 6 (six) hours as needed for moderate pain., Disp: 20 tablet, Rfl: 0 .  lisinopril (PRINIVIL,ZESTRIL) 5 MG tablet, TAKE 1 TABLET BY MOUTH DAILY., Disp: 30 tablet, Rfl: 2  No Known Allergies   ROS  As noted in HPI.   Physical Exam  BP 162/91 mmHg  Pulse 106  Temp(Src) 98 F (36.7 C) (Oral)  Resp 16  SpO2 96%   BP Readings from Last 3 Encounters:  07/29/15 162/91  03/30/15 119/66  03/18/15 135/72   Constitutional: Well developed, well nourished, no acute distress Eyes:  EOMI, conjunctiva normal bilaterally HENT: Normocephalic, atraumatic,mucus membranes moist Respiratory: Normal inspiratory effort, lungs clear bilaterally. Normal appearance of the chest.no rash. Tenderness anterior lower rib cage in the mid clavicular line. No other tenderness along the chest wall.No crepitus. No bruising. Cardiovascular: mild tachycardia, regular rhythm, no murmurs, rubs, gallops. GI: normal appearance, soft, nondistended, normal bowel sounds. No right upper quadrant tenderness. skin: No rash, skin intact Musculoskeletal: no deformities Neurologic: Alert & oriented x 3, no focal neuro deficits Psychiatric: Speech and behavior appropriate   ED Course   Medications - No data to display  Orders Placed This Encounter  Procedures  . DG Ribs Unilateral W/Chest Right    Standing Status: Standing     Number of Occurrences: 1     Standing Expiration Date:     Order Specific Question:  Reason for Exam (SYMPTOM  OR DIAGNOSIS REQUIRED)    Answer:  pain lower ribs anteriorally r/o fx disclocation PTX, PNA, infarct    No results found for this or any previous visit (from the past 24 hour(s)). Dg Ribs Unilateral W/chest Right  07/29/2015  CLINICAL DATA:  RIGHT rib pain for 2 weeks, no known injury, smoker EXAM: RIGHT RIBS AND CHEST  - 3+ VIEW COMPARISON:  Chest radiograph 10/05/2014 FINDINGS: Normal heart size, mediastinal contours, and pulmonary vascularity. Lungs appear emphysematous with mild central peribronchial thickening. No pulmonary infiltrate, pleural effusion or pneumothorax. Bones appear demineralized. Nonunion of an old mid RIGHT clavicular fracture. Probable nipple shadow on chest radiograph, not seen on remaining views. Bones appear demineralized. No acute rib fractures. Slight deformity of several anterior lower RIGHT ribs raising question of subtle old healed fractures. IMPRESSION: No acute abnormalities. Question few old anterior RIGHT rib fractures. Probable COPD. Electronically Signed   By: Lavonia Dana M.D.   On: 07/29/2015 16:39    ED Clinical Impression  Rib pain on right side  Secondary hypertension, unspecified  Noncompliance with medication regimen   ED Assessment/Plan  [facility] narcotic database reviewed. Patient was given norco 7.5/325 #30 on 3/22. None recently. He has been getting his narcotics from a single provider over the past 6 months.  Modified well's score 1/9 due to tachycardia. Doubt PE today but it  is in the ddx- d/w pt. We'll rule out fracture, pneumonia, pneumothorax. Does not appear to be his gallbladder. If films negative, will send home with diclofenac, short course of Norco 7.5/325 since he is not opiate nave and follow-up with his PMD prn.   Imaging independently reviewed. No acute fracture, pulmonary infiltrate or pleural effusion or pneumothorax. Several old healed fractures per radiology. See radiology report for details.  Presentation today appears to be musculoskeletal. Plan as above.  Pt hypertensive today. Has not taken BP meds in a week and also appears to be in some pain. Pt has no evidence of end organ damage on hx. Pt denies any CNS type sx such as HA, visual changes, focal paresis, or new onset seizure activity. Pt denies any CV sx such as CP, dyspnea,  palpitations, pedal edema, tearing pain radiating to back or abd. Pt denied any renal sx such as anuria or hematuria. Denies illicit drug use. Emphasized importance of taking his blood pressure medicine on a regular basis. Discussed the importance of taking usual BP medications. Pt to f/u with [facility] as OP.    Discussed signs and symptoms that should prompt return to emergency department. Patient agrees with plan.   *This clinic note was created using Dragon dictation software. Therefore, there may be occasional mistakes despite careful proofreading.  ?   Melynda Ripple, MD 07/30/15 1221

## 2015-07-29 NOTE — Discharge Instructions (Signed)
Your x-rays were negative for any acute fractures, pneumonia, or collapsed lung today. Try the diclofenac and a regular basis. Stop all other ibuprofen. Try the Norco for severe pain only. Go to the ER for the signs and symptoms we discussed.  You must take your blood pressure medicines. Follow-up with your primary care physician in several days to have them changed.Decrease your salt intake. diet and exercise will lower your blood pressure significantly. It is important to keep your blood pressure under good control, as having a elevated for prolonged periods of time significantly increases your risk of stroke, heart attacks, kidney damage, eye damage, and other problems. Return here in a week for blood pressure recheck if you're able to find a primary care physician by then. Return immediately to the ER if you start having chest pain, headache, problems seeing, problems talking, problems walking, if you feel like you're about to pass out, if you do pass out, if you have a seizure, or for any other concerns.Marland Kitchen

## 2015-08-11 ENCOUNTER — Emergency Department (HOSPITAL_COMMUNITY): Payer: Self-pay

## 2015-08-11 ENCOUNTER — Emergency Department (HOSPITAL_COMMUNITY)
Admission: EM | Admit: 2015-08-11 | Discharge: 2015-08-11 | Disposition: A | Discharge status: Home / Self Care | Payer: Self-pay | Attending: Emergency Medicine | Admitting: Emergency Medicine

## 2015-08-11 ENCOUNTER — Encounter (HOSPITAL_COMMUNITY): Payer: Self-pay

## 2015-08-11 DIAGNOSIS — I10 Essential (primary) hypertension: Secondary | ICD-10-CM | POA: Insufficient documentation

## 2015-08-11 DIAGNOSIS — F17210 Nicotine dependence, cigarettes, uncomplicated: Secondary | ICD-10-CM | POA: Insufficient documentation

## 2015-08-11 DIAGNOSIS — R1011 Right upper quadrant pain: Secondary | ICD-10-CM | POA: Insufficient documentation

## 2015-08-11 DIAGNOSIS — Z791 Long term (current) use of non-steroidal anti-inflammatories (NSAID): Secondary | ICD-10-CM | POA: Insufficient documentation

## 2015-08-11 DIAGNOSIS — M5136 Other intervertebral disc degeneration, lumbar region: Secondary | ICD-10-CM | POA: Insufficient documentation

## 2015-08-11 DIAGNOSIS — Z7982 Long term (current) use of aspirin: Secondary | ICD-10-CM | POA: Insufficient documentation

## 2015-08-11 DIAGNOSIS — Z96651 Presence of right artificial knee joint: Secondary | ICD-10-CM | POA: Insufficient documentation

## 2015-08-11 DIAGNOSIS — E119 Type 2 diabetes mellitus without complications: Secondary | ICD-10-CM | POA: Insufficient documentation

## 2015-08-11 DIAGNOSIS — Z794 Long term (current) use of insulin: Secondary | ICD-10-CM | POA: Insufficient documentation

## 2015-08-11 DIAGNOSIS — Z79899 Other long term (current) drug therapy: Secondary | ICD-10-CM | POA: Insufficient documentation

## 2015-08-11 DIAGNOSIS — R0789 Other chest pain: Secondary | ICD-10-CM | POA: Insufficient documentation

## 2015-08-11 DIAGNOSIS — Z79891 Long term (current) use of opiate analgesic: Secondary | ICD-10-CM | POA: Insufficient documentation

## 2015-08-11 LAB — COMPREHENSIVE METABOLIC PANEL
ALT: 31 U/L (ref 17–63)
AST: 22 U/L (ref 15–41)
Albumin: 4 g/dL (ref 3.5–5.0)
Alkaline Phosphatase: 78 U/L (ref 38–126)
Anion gap: 8 (ref 5–15)
BUN: 15 mg/dL (ref 6–20)
CO2: 24 mmol/L (ref 22–32)
Calcium: 9.2 mg/dL (ref 8.9–10.3)
Chloride: 100 mmol/L — ABNORMAL LOW (ref 101–111)
Creatinine, Ser: 0.99 mg/dL (ref 0.61–1.24)
GFR calc Af Amer: >60 mL/min (ref >60)
GFR calc non Af Amer: >60 mL/min (ref >60)
Glucose, Bld: 314 mg/dL — ABNORMAL HIGH (ref 65–99)
Potassium: 4.5 mmol/L (ref 3.5–5.1)
Sodium: 132 mmol/L — ABNORMAL LOW (ref 135–145)
Total Bilirubin: 1.1 mg/dL (ref 0.3–1.2)
Total Protein: 7.9 g/dL (ref 6.5–8.1)

## 2015-08-11 LAB — CBC WITH DIFFERENTIAL/PLATELET
Basophils Absolute: 0 10*3/uL (ref 0.0–0.1)
Basophils Relative: 1 %
Eosinophils Absolute: 0.2 10*3/uL (ref 0.0–0.7)
Eosinophils Relative: 2 %
HCT: 47.6 % (ref 39.0–52.0)
Hemoglobin: 16.3 g/dL (ref 13.0–17.0)
Lymphocytes Relative: 28 %
Lymphs Abs: 2.4 10*3/uL (ref 0.7–4.0)
MCH: 29 pg (ref 26.0–34.0)
MCHC: 34.2 g/dL (ref 30.0–36.0)
MCV: 84.5 fL (ref 78.0–100.0)
Monocytes Absolute: 0.6 10*3/uL (ref 0.1–1.0)
Monocytes Relative: 8 %
Neutro Abs: 5.2 10*3/uL (ref 1.7–7.7)
Neutrophils Relative %: 61 %
Platelets: 234 10*3/uL (ref 150–400)
RBC: 5.63 MIL/uL (ref 4.22–5.81)
RDW: 14.3 % (ref 11.5–15.5)
WBC: 8.5 10*3/uL (ref 4.0–10.5)

## 2015-08-11 LAB — I-STAT TROPONIN, ED: Troponin i, poc: 0.01 ng/mL (ref 0.00–0.08)

## 2015-08-11 LAB — LIPASE, BLOOD: Lipase: 34 U/L (ref 11–51)

## 2015-08-11 LAB — D-DIMER, QUANTITATIVE: D-Dimer, Quant: 0.41 ug/mL-FEU (ref 0.00–0.50)

## 2015-08-11 MED ORDER — DICLOFENAC SODIUM 75 MG PO TBEC: 75.0000 mg | DELAYED_RELEASE_TABLET | Freq: Two times a day (BID) | ORAL | Status: DC

## 2015-08-11 MED ORDER — CYCLOBENZAPRINE HCL 10 MG PO TABS
10.0000 mg | ORAL_TABLET | Freq: Three times a day (TID) | ORAL | Status: DC | PRN
Start: 2015-08-11 — End: 2015-10-28

## 2015-08-11 NOTE — ED Notes (Signed)
Pt reports understanding of discharge information. No questions at time of discharge 

## 2015-08-11 NOTE — ED Notes (Signed)
Patient states he began having right upper abdominal pain x 4 weeks. Patient states he went to an UC 2 weeks ago and had x-rays. Patient states the pain is worse with movement, coughing. Sneezing. Patient states he was given muscle relaxants by the [facility] physician and hydrocodone, but has since ran out and now is having continuous pain.

## 2015-08-11 NOTE — Discharge Instructions (Signed)

## 2015-08-11 NOTE — ED Provider Notes (Signed)
CSN: [id number]     Arrival date & time 08/11/15  1438 History   First MD Initiated Contact with Patient 08/11/15 1615     Chief Complaint  Patient presents with  . Abdominal Pain  . Medication Refill     Patient is a 55 y.o. male presenting with abdominal pain. The history is provided by the patient. No language interpreter was used.  Abdominal Pain  TERRYN BEGAY is a 55 y.o. male who presents to the Emergency Department complaining of abdominal pain.  He reports two weeks of RUQ/right lower chest wall pain.  Pain is constant but worse with movement, sitting up, deep breaths.  He was seen at urgent care two weeks ago and treated with pain medications and muscle relaxers but he has continued pain and presents now for repeat evaluation.  He denies fevers, diaphoresis, SOB, vomiting, nausea, diarrhea, dysuria, leg swelling or pain.  He has occasional mild cough.  He had his knee replaced in December.  No hx/o DVT/PE.  He does have a hx/o DM, HTN.    Past Medical History  Diagnosis Date  . Diabetes mellitus without complication ([place])   . Hypertension   . Rotator cuff disorder   . DDD (degenerative disc disease), lumbar    Past Surgical History  Procedure Laterality Date  . Knee arthroscopy w/ acl reconstruction Right   . Amputation Left 10/02/2014    Procedure: Left Third toe amputation ;  Surgeon: [name], MD;  Location: [place];  Service: Orthopedics;  Laterality: Left;  Regular bed, wants to follow hip  . Application of wound vac Left 10/02/2014    Procedure: APPLICATION OF WOUND VAC; toe Surgeon: [name], MD;  Location: [place];  Service: Orthopedics;  Laterality: Left;  . I&d extremity Left 10/05/2014    Procedure: IRRIGATION AND DEBRIDEMENT LEFT FOOT;  Surgeon: [name], MD;  Location: [place];  Service: Orthopedics;  Laterality: Left;  . Anterior cruciate ligament repair Right 69    reconstruction  . Total knee arthroplasty Right 03/28/2015  . Total knee arthroplasty  Right 03/28/2015    Procedure: RIGHT TOTAL KNEE ARTHROPLASTY;  Surgeon: [name], MD;  Location: [place];  Service: Orthopedics;  Laterality: Right;   Family History  Problem Relation Age of Onset  . Diabetes Father   . Hypertension Father   . Heart failure Father    Social History  Substance Use Topics  . Smoking status: Current Some Day Smoker -- 0.25 packs/day for 38 years    Types: Cigarettes  . Smokeless tobacco: Never Used  . Alcohol Use: 0.0 oz/week    0 Standard drinks or equivalent per week     Comment: 2 -3days week goes to bar to have "a couple of beers so he can sleep"    Review of Systems  Gastrointestinal: Positive for abdominal pain.  All other systems reviewed and are negative.     Allergies  Review of patient's allergies indicates no known allergies.  Home Medications   Prior to Admission medications   Medication Sig Start Date End Date Taking? Authorizing Provider  amLODipine (NORVASC) 5 MG tablet TAKE 1 TABLET BY MOUTH DAILY. 04/02/15  Yes Lance Bosch, NP  HYDROcodone-acetaminophen (NORCO) 7.5-325 MG tablet Take 1 tablet by mouth every 6 (six) hours as needed for moderate pain. 07/29/15  Yes Melynda Ripple, MD  ibuprofen (ADVIL,MOTRIN) 200 MG tablet Take 1,200-1,400 mg by mouth 2 (two) times daily as needed for moderate  pain.    Yes Historical Provider, MD  insulin aspart (NOVOLOG) 100 UNIT/ML injection Inject 6 Units into the skin 3 (three) times daily with meals. 12/13/14  Yes Lance Bosch, NP  insulin glargine (LANTUS) 100 UNIT/ML injection Inject 0.35 mLs (35 Units total) into the skin daily. Please provide pen Patient taking differently: Inject 4 Units into the skin daily. Please provide pen 12/13/14  Yes Lance Bosch, NP  lisinopril (PRINIVIL,ZESTRIL) 5 MG tablet TAKE 1 TABLET BY MOUTH DAILY. 04/02/15  Yes Lance Bosch, NP  aspirin EC 325 MG tablet Take 1 tablet (325 mg total) by mouth 2 (two) times daily. Patient not taking: Reported on  08/11/2015 03/28/15   [name], MD  cyclobenzaprine (FLEXERIL) 10 MG tablet Take 1 tablet (10 mg total) by mouth 3 (three) times daily as needed for muscle spasms. 08/11/15   Quintella Reichert, MD  diclofenac (VOLTAREN) 75 MG EC tablet Take 1 tablet (75 mg total) by mouth 2 (two) times daily. Take with food 08/11/15   Quintella Reichert, MD  Insulin Pen Needle (B-D ULTRAFINE III SHORT PEN) 31G X 8 MM MISC 1 each by Does not apply route 3 (three) times daily. 11/12/14   Arnoldo Morale, MD  Insulin Syringe-Needle U-100 (TRUEPLUS INSULIN SYRINGE) 30G X 5/16" 0.5 ML MISC Use as directed 3 times daily 12/14/14   Arnoldo Morale, MD   BP 153/97 mmHg  Pulse 85  Temp(Src) 98.2 F (36.8 C) (Oral)  Resp 18  Ht 6' 2.5" (1.892 m)  Wt 285 lb (129.275 kg)  BMI 36.11 kg/m2  SpO2 98% Physical Exam  Constitutional: He is oriented to person, place, and time. He appears well-developed and well-nourished.  HENT:  Head: Normocephalic and atraumatic.  Cardiovascular: Normal rate and regular rhythm.   No murmur heard. Pulmonary/Chest: Effort normal and breath sounds normal. No respiratory distress. He exhibits no tenderness.  Abdominal: Soft. There is no rebound and no guarding.  Mild to moderate RUQ tenderness  Musculoskeletal: He exhibits no edema or tenderness.  Neurological: He is alert and oriented to person, place, and time.  Skin: Skin is warm and dry.  Psychiatric: He has a normal mood and affect. His behavior is normal.  Nursing note and vitals reviewed.   ED Course  Procedures (including critical care time) Labs Review Labs Reviewed  COMPREHENSIVE METABOLIC PANEL - Abnormal; Notable for the following:    Sodium 132 (*)    Chloride 100 (*)    Glucose, Bld 314 (*)    All other components within normal limits  CBC WITH DIFFERENTIAL/PLATELET  LIPASE, BLOOD  D-DIMER, QUANTITATIVE (NOT AT [facility])  Randolm Idol, ED    Imaging Review Dg Chest 2 View  08/11/2015  CLINICAL DATA:  Right lateral rib  pain. EXAM: CHEST  2 VIEW COMPARISON:  None. FINDINGS: The heart size and mediastinal contours are within normal limits. Both lungs are clear. The visualized skeletal structures are unremarkable. IMPRESSION: No active cardiopulmonary disease. Electronically Signed   By: Kathreen Devoid   On: 08/11/2015 19:24   US Abdomen Limited Ruq  08/11/2015  CLINICAL DATA:  Right upper quadrant pain for 1 month EXAM: US ABDOMEN LIMITED - RIGHT UPPER QUADRANT COMPARISON:  None. FINDINGS: Gallbladder: No gallstones or wall thickening visualized. No sonographic Murphy sign noted by sonographer. Common bile duct: Diameter: 3.4 mm Liver: No focal lesion identified. Increased hepatic parenchymal echogenicity. IMPRESSION: 1. No cholelithiasis or sonographic evidence of acute cholecystitis. 2. Increased hepatic echogenicity as can be  seen with hepatic steatosis. Electronically Signed   By: Kathreen Devoid   On: 08/11/2015 20:29   I have personally reviewed and evaluated these images and lab results as part of my medical decision-making.   EKG Interpretation   Date/Time:  Sunday Aug 11 2015 16:58:50 EDT Ventricular Rate:  91 PR Interval:  149 QRS Duration: 113 QT Interval:  341 QTC Calculation: 419 R Axis:   25 Text Interpretation:  Sinus rhythm Atrial premature complexes Probable  left atrial enlargement Borderline intraventricular conduction delay  Borderline ST elevation, anterior leads Confirmed by Hazle Coca [phone]) on  08/11/2015 5:07:37 PM      MDM   Final diagnoses:  RUQ abdominal pain    Patient here for evaluation of right upper quadrant pain that is reproducible on examination with palpation and with range of motion. Presentation is not consistent with PE, ACS, cholecystitis. Discussed the patient findings of fatty liver and musculoskeletal pain. Treating with Flexeril, diclofenac. Discussed home care, outpatient follow-up, return precautions.  Quintella Reichert, MD 08/12/15 0140

## 2015-09-10 MED FILL — !NOVOLOG 100UNITS/ML VIAL: 100/ML | 28 days supply | Qty: 10 | Fill #4

## 2015-09-10 MED FILL — ?LISINOPRIL 5 MG TABLET: 5 | 30 days supply | Qty: 30 | Fill #1

## 2015-09-10 MED FILL — ?AMLODIPINE BESYLATE 5 MG T: 5 | 30 days supply | Qty: 30 | Fill #1

## 2015-09-10 MED FILL — $LANTUS SOLOSTAR 100 UNITS/: 100 | 30 days supply | Qty: 9 | Fill #2

## 2015-09-18 ENCOUNTER — Other Ambulatory Visit: Payer: Self-pay | Admitting: *Deleted

## 2015-09-18 DIAGNOSIS — E119 Type 2 diabetes mellitus without complications: Secondary | ICD-10-CM

## 2015-09-18 MED ORDER — INSULIN ASPART 100 UNIT/ML ~~LOC~~ SOLN: 6.0000 [IU] | Freq: Three times a day (TID) | SUBCUTANEOUS | Status: DC

## 2015-09-18 NOTE — Telephone Encounter (Signed)
PASS PROGRAM 

## 2015-10-09 ENCOUNTER — Ambulatory Visit: Payer: Self-pay | Admitting: Family Medicine

## 2015-10-28 ENCOUNTER — Encounter: Payer: Self-pay | Admitting: Family Medicine

## 2015-10-28 ENCOUNTER — Ambulatory Visit: Payer: Self-pay | Attending: Family Medicine | Admitting: Family Medicine

## 2015-10-28 VITALS — BP 137/86 | HR 98 | Temp 98.2°F | Ht 75.0 in | Wt 283.4 lb

## 2015-10-28 DIAGNOSIS — L84 Corns and callosities: Secondary | ICD-10-CM

## 2015-10-28 DIAGNOSIS — Z7982 Long term (current) use of aspirin: Secondary | ICD-10-CM | POA: Insufficient documentation

## 2015-10-28 DIAGNOSIS — Z794 Long term (current) use of insulin: Secondary | ICD-10-CM | POA: Insufficient documentation

## 2015-10-28 DIAGNOSIS — Z791 Long term (current) use of non-steroidal anti-inflammatories (NSAID): Secondary | ICD-10-CM | POA: Insufficient documentation

## 2015-10-28 DIAGNOSIS — E118 Type 2 diabetes mellitus with unspecified complications: Secondary | ICD-10-CM

## 2015-10-28 DIAGNOSIS — M5136 Other intervertebral disc degeneration, lumbar region: Secondary | ICD-10-CM | POA: Insufficient documentation

## 2015-10-28 DIAGNOSIS — Z89422 Acquired absence of other left toe(s): Secondary | ICD-10-CM | POA: Insufficient documentation

## 2015-10-28 DIAGNOSIS — E1165 Type 2 diabetes mellitus with hyperglycemia: Secondary | ICD-10-CM

## 2015-10-28 DIAGNOSIS — Z79899 Other long term (current) drug therapy: Secondary | ICD-10-CM | POA: Insufficient documentation

## 2015-10-28 DIAGNOSIS — E1169 Type 2 diabetes mellitus with other specified complication: Secondary | ICD-10-CM | POA: Insufficient documentation

## 2015-10-28 DIAGNOSIS — M62838 Other muscle spasm: Secondary | ICD-10-CM

## 2015-10-28 DIAGNOSIS — I10 Essential (primary) hypertension: Secondary | ICD-10-CM

## 2015-10-28 DIAGNOSIS — Z9889 Other specified postprocedural states: Secondary | ICD-10-CM | POA: Insufficient documentation

## 2015-10-28 LAB — POCT GLYCOSYLATED HEMOGLOBIN (HGB A1C): Hemoglobin A1C: 11.4

## 2015-10-28 LAB — GLUCOSE, POCT (MANUAL RESULT ENTRY): POC Glucose: 267 mg/dl — AB (ref 70–99)

## 2015-10-28 MED ORDER — CYCLOBENZAPRINE HCL 10 MG PO TABS: 10.0000 mg | ORAL_TABLET | Freq: Three times a day (TID) | ORAL | 0 refills | Status: DC | PRN

## 2015-10-28 MED ORDER — CYCLOBENZAPRINE HCL 10 MG PO TABS: 10.0000 mg | ORAL_TABLET | Freq: Three times a day (TID) | ORAL | 2 refills | Status: DC | PRN

## 2015-10-28 MED ORDER — LOSARTAN POTASSIUM 100 MG PO TABS: 50.0000 mg | ORAL_TABLET | Freq: Every day | ORAL | 3 refills | Status: DC

## 2015-10-28 MED FILL — MELOXICAM 7.5 MG TABLET: 7.5 | 30 days supply | Qty: 30 | Fill #2

## 2015-10-28 MED FILL — ?LOSARTAN POTASSIUM 100 MG: 100 | 60 days supply | Qty: 30 | Fill #0

## 2015-10-28 MED FILL — LISINOPRIL 5 MG TABLET: 5 | 30 days supply | Qty: 30 | Fill #2

## 2015-10-28 MED FILL — CYCLOBENZAPRINE 10 MG TAB: 10 | 10 days supply | Qty: 30 | Fill #0

## 2015-10-28 MED FILL — ?AMLODIPINE BESYLATE 5 MG T: 5 | 30 days supply | Qty: 30 | Fill #2

## 2015-10-28 NOTE — Patient Instructions (Signed)
Diabetes Mellitus and Food It is important for you to manage your blood sugar (glucose) level. Your blood glucose level can be greatly affected by what you eat. Eating healthier foods in the appropriate amounts throughout the day at about the same time each day will help you control your blood glucose level. It can also help slow or prevent worsening of your diabetes mellitus. Healthy eating may even help you improve the level of your blood pressure and reach or maintain a healthy weight.  General recommendations for healthful eating and cooking habits include:  Eating meals and snacks regularly. Avoid going long periods of time without eating to lose weight.  Eating a diet that consists mainly of plant-based foods, such as fruits, vegetables, nuts, legumes, and whole grains.  Using low-heat cooking methods, such as baking, instead of high-heat cooking methods, such as deep frying. Work with your dietitian to make sure you understand how to use the Nutrition Facts information on food labels. HOW CAN FOOD AFFECT ME? Carbohydrates Carbohydrates affect your blood glucose level more than any other type of food. Your dietitian will help you determine how many carbohydrates to eat at each meal and teach you how to count carbohydrates. Counting carbohydrates is important to keep your blood glucose at a healthy level, especially if you are using insulin or taking certain medicines for diabetes mellitus. Alcohol Alcohol can cause sudden decreases in blood glucose (hypoglycemia), especially if you use insulin or take certain medicines for diabetes mellitus. Hypoglycemia can be a life-threatening condition. Symptoms of hypoglycemia (sleepiness, dizziness, and disorientation) are similar to symptoms of having too much alcohol.  If your health care provider has given you approval to drink alcohol, do so in moderation and use the following guidelines:  Women should not have more than one drink per day, and men  should not have more than two drinks per day. One drink is equal to:  12 oz of beer.  5 oz of wine.  1 oz of hard liquor.  Do not drink on an empty stomach.  Keep yourself hydrated. Have water, diet soda, or unsweetened iced tea.  Regular soda, juice, and other mixers might contain a lot of carbohydrates and should be counted. WHAT FOODS ARE NOT RECOMMENDED? As you make food choices, it is important to remember that all foods are not the same. Some foods have fewer nutrients per serving than other foods, even though they might have the same number of calories or carbohydrates. It is difficult to get your body what it needs when you eat foods with fewer nutrients. Examples of foods that you should avoid that are high in calories and carbohydrates but low in nutrients include:  Trans fats (most processed foods list trans fats on the Nutrition Facts label).  Regular soda.  Juice.  Candy.  Sweets, such as cake, pie, doughnuts, and cookies.  Fried foods. WHAT FOODS CAN I EAT? Eat nutrient-rich foods, which will nourish your body and keep you healthy. The food you should eat also will depend on several factors, including:  The calories you need.  The medicines you take.  Your weight.  Your blood glucose level.  Your blood pressure level.  Your cholesterol level. You should eat a variety of foods, including:  Protein.  Lean cuts of meat.  Proteins low in saturated fats, such as fish, egg whites, and beans. Avoid processed meats.  Fruits and vegetables.  Fruits and vegetables that may help control blood glucose levels, such as apples, mangoes, and   yams.  Dairy products.  Choose fat-free or low-fat dairy products, such as milk, yogurt, and cheese.  Grains, bread, pasta, and rice.  Choose whole grain products, such as multigrain bread, whole oats, and Reap rice. These foods may help control blood pressure.  Fats.  Foods containing healthful fats, such as nuts,  avocado, olive oil, canola oil, and fish. DOES EVERYONE WITH DIABETES MELLITUS HAVE THE SAME MEAL PLAN? Because every person with diabetes mellitus is different, there is not one meal plan that works for everyone. It is very important that you meet with a dietitian who will help you create a meal plan that is just right for you.   This information is not intended to replace advice given to you by your health care provider. Make sure you discuss any questions you have with your health care provider.   Document Released: 12/11/2004 Document Revised: 04/06/2014 Document Reviewed: 02/10/2013 Elsevier Interactive Patient Education 2016 [employer]  

## 2015-10-28 NOTE — Progress Notes (Signed)
Subjective:  Patient ID: Jeffery [name], male    DOB: January 13, 1961  Age: 55 y.o. MRN: [id number]  CC: Diabetes and Hypertension   HPI Jeffery [name] is a 55 year old male with a history of type 2 diabetes mellitus (A1c 11.4 from today), obesity, hypertension, right knee replacement in 02/2015 who comes to the clinic for follow-up visit.  He has been taking Lantus but at a reduced dose (not dialing his Lantus pen properly and in an attempt to take 35 units would dial 4 units since there was no 3.5 on the pen). He has not been compliant with a diabetic diet or exercise.  Complains of dry cough with lisinopril which makes him cough so hard that the right side of his chest wall hurts and this pain radiates to his back. He was seen at urgent care for same symptoms and right upper quadrant ultrasound was negative for cholelithiasis or cholecystitis. Pain feels like a pulled muscle he says.  He has no additional complaints at this time.  Past Medical History:  Diagnosis Date  . DDD (degenerative disc disease), lumbar   . Diabetes mellitus without complication ([place])   . Hypertension   . Rotator cuff disorder     Past Surgical History:  Procedure Laterality Date  . AMPUTATION Left 10/02/2014   Procedure: Left Third toe amputation ;  Surgeon: [name], MD;  Location: [place];  Service: Orthopedics;  Laterality: Left;  Regular bed, wants to follow hip  . ANTERIOR CRUCIATE LIGAMENT REPAIR Right 90   reconstruction  . APPLICATION OF WOUND VAC Left 10/02/2014   Procedure: APPLICATION OF WOUND VAC; toe Surgeon: [name], MD;  Location: [place];  Service: Orthopedics;  Laterality: Left;  . I&D EXTREMITY Left 10/05/2014   Procedure: IRRIGATION AND DEBRIDEMENT LEFT FOOT;  Surgeon: [name], MD;  Location: [place];  Service: Orthopedics;  Laterality: Left;  . KNEE ARTHROSCOPY W/ ACL RECONSTRUCTION Right   . TOTAL KNEE ARTHROPLASTY Right 03/28/2015  . TOTAL KNEE ARTHROPLASTY Right  03/28/2015   Procedure: RIGHT TOTAL KNEE ARTHROPLASTY;  Surgeon: [name], MD;  Location: [facility];  Service: Orthopedics;  Laterality: Right;     Outpatient Medications Prior to Visit  Medication Sig Dispense Refill  . insulin aspart (NOVOLOG) 100 UNIT/ML injection Inject 6 Units into the skin 3 (three) times daily with meals. 30 mL 3  . insulin glargine (LANTUS) 100 UNIT/ML injection Inject 0.35 mLs (35 Units total) into the skin daily. Please provide pen (Patient taking differently: Inject 4 Units into the skin daily. Please provide pen) 10 mL 5  . Insulin Pen Needle (B-D ULTRAFINE III SHORT PEN) 31G X 8 MM MISC 1 each by Does not apply route 3 (three) times daily. 100 each 5  . Insulin Syringe-Needle U-100 (TRUEPLUS INSULIN SYRINGE) 30G X 5/16" 0.5 ML MISC Use as directed 3 times daily 100 each 5  . amLODipine (NORVASC) 5 MG tablet TAKE 1 TABLET BY MOUTH DAILY. 30 tablet 2  . ibuprofen (ADVIL,MOTRIN) 200 MG tablet Take 1,200-1,400 mg by mouth 2 (two) times daily as needed for moderate pain.     Marland Kitchen lisinopril (PRINIVIL,ZESTRIL) 5 MG tablet TAKE 1 TABLET BY MOUTH DAILY. 30 tablet 2  . aspirin EC 325 MG tablet Take 1 tablet (325 mg total) by mouth 2 (two) times daily. (Patient not taking: Reported on 08/11/2015) 84 tablet 0  . cyclobenzaprine (FLEXERIL) 10 MG tablet Take 1 tablet (10 mg total) by mouth 3 (  three) times daily as needed for muscle spasms. (Patient not taking: Reported on 10/28/2015) 12 tablet 0  . diclofenac (VOLTAREN) 75 MG EC tablet Take 1 tablet (75 mg total) by mouth 2 (two) times daily. Take with food (Patient not taking: Reported on 10/28/2015) 20 tablet 0  . HYDROcodone-acetaminophen (NORCO) 7.5-325 MG tablet Take 1 tablet by mouth every 6 (six) hours as needed for moderate pain. (Patient not taking: Reported on 10/28/2015) 20 tablet 0   No facility-administered medications prior to visit.     ROS Review of Systems  Constitutional: Negative for activity change and appetite  change.  HENT: Negative for sinus pressure and sore throat.   Eyes: Negative for visual disturbance.  Respiratory: Negative for cough, chest tightness and shortness of breath.   Cardiovascular: Positive for chest pain (Right-sided chest wall pain). Negative for leg swelling.  Gastrointestinal: Negative for abdominal distention, abdominal pain, constipation and diarrhea.  Endocrine: Negative.   Genitourinary: Negative for dysuria.  Musculoskeletal: Negative for joint swelling and myalgias.  Skin: Negative for rash.  Allergic/Immunologic: Negative.   Neurological: Negative for weakness, light-headedness and numbness.  Psychiatric/Behavioral: Negative for dysphoric mood and suicidal ideas.    Objective:  BP 137/86 (BP Location: Right Arm, Patient Position: Sitting, Cuff Size: Large)   Pulse 98   Temp 98.2 F (36.8 C)   Ht 6\' 3"  (1.905 m)   Wt 283 lb 6.4 oz (128.5 kg)   SpO2 97%   BMI 35.42 kg/m   BP/Weight 10/28/2015 [id number] 0000000  Systolic BP 0000000 0000000 0000000  Diastolic BP 86 97 91  Wt. (Lbs) 283.4 285 -  BMI 35.42 36.11 -      Physical Exam  Constitutional: He is oriented to person, place, and time. He appears well-developed and well-nourished.  Cardiovascular: Normal rate, normal heart sounds and intact distal pulses.   No murmur heard. Pulmonary/Chest: Effort normal and breath sounds normal. He has no wheezes. He has no rales. He exhibits tenderness (on deep palpation of lower right chest wall extending to the lateral aspect).  Abdominal: Soft. Bowel sounds are normal. He exhibits no distension and no mass. There is no tenderness.  Musculoskeletal: Normal range of motion.  Neurological: He is alert and oriented to person, place, and time.  Skin: Skin is warm and dry.  Psychiatric: He has a normal mood and affect.      Assessment & Plan:   1. Uncontrolled type 2 diabetes mellitus with complication, unspecified long term insulin use status ([place]) Uncontrolled with A1c  of 11.4 He has been administering 4 units of insulin rather than 35 units due to improper Dialing of Lantus pen Educated about correct dialing by the pharmacist. Burnis Medin review blood sugar log at next visit Will need initiation of statin once lipid panel results are back. - Glucose (CBG) - HgB A1c - Microalbumin / creatinine urine ratio; Future - Ambulatory referral to Podiatry  2. Essential hypertension Controlled Switched from lisinopril to losartan due to dry cough We'll discontinue amlodipine and so he is only taking one antihypertensive - COMPLETE METABOLIC PANEL WITH GFR; Future - Lipid panel; Future - losartan (COZAAR) 100 MG tablet; Take 0.5 tablets (50 mg total) by mouth daily.  Dispense: 30 tablet; Refill: 3  3. Muscle spasm Secondary to prolonged cough - cyclobenzaprine (FLEXERIL) 10 MG tablet; Take 1 tablet (10 mg total) by mouth 3 (three) times daily as needed for muscle spasms.  Dispense: 12 tablet; Refill: 0  4. Callus of foot - Ambulatory referral  to Podiatry   Meds ordered this encounter  Medications  . cyclobenzaprine (FLEXERIL) 10 MG tablet    Sig: Take 1 tablet (10 mg total) by mouth 3 (three) times daily as needed for muscle spasms.    Dispense:  12 tablet    Refill:  0  . losartan (COZAAR) 100 MG tablet    Sig: Take 0.5 tablets (50 mg total) by mouth daily.    Dispense:  30 tablet    Refill:  3    Discontinue Lisinopril and Amlodipine    Follow-up: Return in about 3 weeks (around 11/18/2015) for follow up on Diabetes mellitus; place on Podiatry schedule.   Arnoldo Morale MD

## 2015-10-31 ENCOUNTER — Other Ambulatory Visit: Payer: Self-pay

## 2015-11-07 MED FILL — CYCLOBENZAPRINE 10 MG TAB: 10 | 10 days supply | Qty: 30 | Fill #1

## 2015-11-08 ENCOUNTER — Other Ambulatory Visit: Payer: Self-pay

## 2015-11-11 MED FILL — $LANTUS SOLOSTAR 100 UNITS/: 100 | 30 days supply | Qty: 12 | Fill #0

## 2015-11-16 ENCOUNTER — Encounter (INDEPENDENT_AMBULATORY_CARE_PROVIDER_SITE_OTHER): Payer: Self-pay

## 2015-11-20 MED FILL — CYCLOBENZAPRINE 10 MG TAB: 10 | 10 days supply | Qty: 30 | Fill #2

## 2015-12-17 MED FILL — ?LOSARTAN POTASSIUM 100 MG: 100 | 60 days supply | Qty: 30 | Fill #1

## 2015-12-23 ENCOUNTER — Other Ambulatory Visit: Payer: Self-pay | Admitting: Internal Medicine

## 2015-12-23 DIAGNOSIS — E119 Type 2 diabetes mellitus without complications: Secondary | ICD-10-CM

## 2016-01-13 ENCOUNTER — Ambulatory Visit (INDEPENDENT_AMBULATORY_CARE_PROVIDER_SITE_OTHER): Payer: Self-pay | Admitting: Orthopaedic Surgery

## 2016-02-11 ENCOUNTER — Telehealth: Payer: Self-pay | Admitting: General Practice

## 2016-02-11 NOTE — Telephone Encounter (Signed)
Left patient a msg informing him that his rx is ready to be picked up.

## 2016-02-25 ENCOUNTER — Other Ambulatory Visit: Payer: Self-pay | Admitting: Internal Medicine

## 2016-02-25 DIAGNOSIS — E119 Type 2 diabetes mellitus without complications: Secondary | ICD-10-CM

## 2016-02-25 MED FILL — LOSARTAN POTASSIUM 100 MG T: 100 | 60 days supply | Qty: 30 | Fill #2

## 2016-02-27 ENCOUNTER — Ambulatory Visit (INDEPENDENT_AMBULATORY_CARE_PROVIDER_SITE_OTHER): Payer: Self-pay

## 2016-02-27 ENCOUNTER — Ambulatory Visit (INDEPENDENT_AMBULATORY_CARE_PROVIDER_SITE_OTHER): Payer: Self-pay | Admitting: Orthopaedic Surgery

## 2016-02-27 DIAGNOSIS — M175 Other unilateral secondary osteoarthritis of knee: Secondary | ICD-10-CM

## 2016-02-27 DIAGNOSIS — Z96651 Presence of right artificial knee joint: Secondary | ICD-10-CM

## 2016-02-27 NOTE — Progress Notes (Signed)
Office Visit Note   Patient: Jeffery [name]           Date of Birth: [birth year]           MRN: [id number] Visit Date: 02/27/2016              Requested by: No referring provider defined for this encounter. PCP: No PCP Per Patient   Assessment & Plan: Visit Diagnoses:  1. Other secondary osteoarthritis of right knee   2. Status post total right knee replacement using cement     Plan:  - right TKA doing very well, f/u 1 year for 2 year visit, needs xrays at that time - right shoulder is bothering, would recommend MRI but patient wants to get insurance first  Follow-Up Instructions: Return in about 1 year (around 02/26/2017) for recheck right TKA.   Orders:  Orders Placed This Encounter  Procedures  . XR Knee 1-2 Views Right   No orders of the defined types were placed in this encounter.     Procedures: No procedures performed   Clinical Data: No additional findings.   Subjective: Chief Complaint  Patient presents with  . Right Knee - Pain    1 year postop visit for right TKA.  Doing well, no real complaints. Right shoulder slightly bothers him when extending his arm forward.  Denies radicular pain.      Review of Systems  Constitutional: Negative.   HENT: Negative.   Eyes: Negative.   Respiratory: Negative.   Cardiovascular: Negative.   Gastrointestinal: Negative.   Endocrine: Negative.   Genitourinary: Negative.   Musculoskeletal: Negative.   Skin: Negative.   Allergic/Immunologic: Negative.   Neurological: Negative.   Hematological: Negative.   Psychiatric/Behavioral: Negative.      Objective: Vital Signs: There were no vitals taken for this visit.  Physical Exam  Constitutional: He is oriented to person, place, and time. He appears well-developed and well-nourished.  HENT:  Head: Normocephalic and atraumatic.  Eyes: EOM are normal.  Neck: Neck supple.  Cardiovascular: Intact distal pulses.   Pulmonary/Chest: Effort normal.    Abdominal: Soft.  Neurological: He is alert and oriented to person, place, and time.  Skin: Skin is warm.  Psychiatric: He has a normal mood and affect. His behavior is normal. Judgment and thought content normal.  Nursing note and vitals reviewed.   Right Knee Exam  Right knee exam is normal.  Tenderness  The patient is experiencing no tenderness.     Range of Motion  The patient has normal right knee ROM.  Muscle Strength   The patient has normal right knee strength.   Right Shoulder Exam   Tests  Apprehension: negative Cross Arm: negative Drop Arm: negative Hawkin's test: positive Impingement: positive Sulcus: absent  Other  Sensation: normal Pulse: present  Comments:  Good RC strength.  Negative Obrien and speed      Specialty Comments:  No specialty comments available.  Imaging: Xr Knee 1-2 Views Right  Result Date: 02/27/2016 Stable right TKA.  Mild DJD of left knee    PMFS History: Patient Active Problem List   Diagnosis Date Noted  . S/P total knee replacement using cement 03/28/2015  . Knee osteoarthritis 11/12/2014  . Hypertension 10/15/2014  . Cellulitis of left lower extremity   . Osteomyelitis of toe of left foot ([place])   . Toe osteomyelitis, left ([place]) 10/01/2014  . Cellulitis of leg, left 10/01/2014  . Uncontrolled diabetes mellitus with complications ([place]) 97/98/9211  .  Accelerated hypertension 10/01/2014  . Gangrene left third toe 10/01/2014  . SHOULDER JOINT INSTABILITY 05/27/2010  . SHOULDER PAIN, RIGHT 05/27/2010   Past Medical History:  Diagnosis Date  . DDD (degenerative disc disease), lumbar   . Diabetes mellitus without complication ([place])   . Hypertension   . Rotator cuff disorder     Family History  Problem Relation Age of Onset  . Diabetes Father   . Hypertension Father   . Heart failure Father     Past Surgical History:  Procedure Laterality Date  . AMPUTATION Left 10/02/2014   Procedure: Left Third toe  amputation ;  Surgeon: [name], MD;  Location: [place] [facility];  Service: Orthopedics;  Laterality: Left;  Regular bed, wants to follow hip  . ANTERIOR CRUCIATE LIGAMENT REPAIR Right 90   reconstruction  . APPLICATION OF WOUND VAC Left 10/02/2014   Procedure: APPLICATION OF WOUND VAC; toe Surgeon: [name], MD;  Location: [place];  Service: Orthopedics;  Laterality: Left;  . I&D EXTREMITY Left 10/05/2014   Procedure: IRRIGATION AND DEBRIDEMENT LEFT FOOT;  Surgeon: [name], MD;  Location: [facility];  Service: Orthopedics;  Laterality: Left;  . KNEE ARTHROSCOPY W/ ACL RECONSTRUCTION Right   . TOTAL KNEE ARTHROPLASTY Right 03/28/2015  . TOTAL KNEE ARTHROPLASTY Right 03/28/2015   Procedure: RIGHT TOTAL KNEE ARTHROPLASTY;  Surgeon: [name], MD;  Location: [facility];  Service: Orthopedics;  Laterality: Right;   Social History   Occupational History  . Landscaping & rental handyman    Social History Main Topics  . Smoking status: Current Some Day Smoker    Packs/day: 0.25    Years: 38.00    Types: Cigarettes  . Smokeless tobacco: Never Used  . Alcohol use 3.0 - 3.6 oz/week    5 - 6 Cans of beer per week     Comment: 2 -3days week goes to bar to have "a couple of beers so he can sleep"  . Drug use:     Types: Marijuana     Comment: for pain but not on a regular basis  . Sexual activity: Not on file

## 2016-04-01 MED FILL — !NOVOLOG 100UNITS/ML VIAL: 100/ML | 28 days supply | Qty: 10 | Fill #0

## 2016-04-01 MED FILL — $LANTUS SOLOSTAR 100 UNITS/: 100 | 30 days supply | Qty: 12 | Fill #0

## 2016-04-03 ENCOUNTER — Other Ambulatory Visit: Payer: Self-pay | Admitting: Family Medicine

## 2016-04-03 MED FILL — LOSARTAN POTASSIUM 100 MG T: 100 | 60 days supply | Qty: 30 | Fill #3

## 2016-04-09 ENCOUNTER — Ambulatory Visit: Payer: Self-pay | Attending: Internal Medicine

## 2016-04-22 ENCOUNTER — Other Ambulatory Visit: Payer: Self-pay

## 2016-04-22 DIAGNOSIS — E119 Type 2 diabetes mellitus without complications: Secondary | ICD-10-CM

## 2016-04-24 ENCOUNTER — Other Ambulatory Visit: Payer: Self-pay | Admitting: *Deleted

## 2016-04-24 MED ORDER — INSULIN LISPRO 100 UNIT/ML ~~LOC~~ SOLN: 6.0000 [IU] | Freq: Three times a day (TID) | SUBCUTANEOUS | 3 refills | Status: DC

## 2016-04-24 NOTE — Telephone Encounter (Signed)
PRINTED FOR PASS PROGRAM 

## 2016-04-28 MED FILL — !NOVOLOG 100UNITS/ML VIAL: 100/ML | 28 days supply | Qty: 10 | Fill #1

## 2016-04-28 MED FILL — TRUEPLUS SYR 0.3ML 30GX5/16: 30G X 5/16" | 33 days supply | Qty: 100 | Fill #0

## 2016-04-28 MED FILL — !LANTUS SOLOSTAR 100UNITS/M: 100 | 7 days supply | Qty: 3 | Fill #1

## 2016-05-07 MED FILL — $LANTUS SOLOSTAR 100 UNITS/: 100 | 8 days supply | Qty: 3 | Fill #2

## 2016-05-14 ENCOUNTER — Ambulatory Visit: Payer: Self-pay | Attending: Internal Medicine | Admitting: Physician Assistant

## 2016-05-14 ENCOUNTER — Encounter: Payer: Self-pay | Admitting: Physician Assistant

## 2016-05-14 VITALS — BP 147/87 | HR 68 | Temp 97.6°F | Resp 16 | Wt 303.8 lb

## 2016-05-14 DIAGNOSIS — M5136 Other intervertebral disc degeneration, lumbar region: Secondary | ICD-10-CM | POA: Insufficient documentation

## 2016-05-14 DIAGNOSIS — Z7982 Long term (current) use of aspirin: Secondary | ICD-10-CM | POA: Insufficient documentation

## 2016-05-14 DIAGNOSIS — Z794 Long term (current) use of insulin: Secondary | ICD-10-CM | POA: Insufficient documentation

## 2016-05-14 DIAGNOSIS — M25571 Pain in right ankle and joints of right foot: Secondary | ICD-10-CM | POA: Insufficient documentation

## 2016-05-14 DIAGNOSIS — I10 Essential (primary) hypertension: Secondary | ICD-10-CM | POA: Insufficient documentation

## 2016-05-14 DIAGNOSIS — E118 Type 2 diabetes mellitus with unspecified complications: Secondary | ICD-10-CM

## 2016-05-14 DIAGNOSIS — E1165 Type 2 diabetes mellitus with hyperglycemia: Secondary | ICD-10-CM | POA: Insufficient documentation

## 2016-05-14 LAB — POCT GLYCOSYLATED HEMOGLOBIN (HGB A1C): Hemoglobin A1C: 11.1

## 2016-05-14 LAB — POCT URINALYSIS DIPSTICK
Bilirubin, UA: NEGATIVE
Blood, UA: NEGATIVE
Glucose, UA: 500
Ketones, UA: NEGATIVE
Leukocytes, UA: NEGATIVE
Nitrite, UA: NEGATIVE
Protein, UA: NEGATIVE
Spec Grav, UA: <=1.005
Urobilinogen, UA: 1
pH, UA: 6

## 2016-05-14 LAB — CBC WITH DIFFERENTIAL/PLATELET
Basophils Absolute: 0 cells/uL (ref 0–200)
Basophils Relative: 0 %
Eosinophils Absolute: 352 cells/uL (ref 15–500)
Eosinophils Relative: 4 %
HCT: 46.8 % (ref 38.5–50.0)
Hemoglobin: 15.5 g/dL (ref 13.2–17.1)
Lymphocytes Relative: 20 %
Lymphs Abs: 1760 cells/uL (ref 850–3900)
MCH: 28.9 pg (ref 27.0–33.0)
MCHC: 33.1 g/dL (ref 32.0–36.0)
MCV: 87.3 fL (ref 80.0–100.0)
MPV: 8.6 fL (ref 7.5–12.5)
Monocytes Absolute: 792 cells/uL (ref 200–950)
Monocytes Relative: 9 %
Neutro Abs: 5896 cells/uL (ref 1500–7800)
Neutrophils Relative %: 67 %
Platelets: 220 10*3/uL (ref 140–400)
RBC: 5.36 MIL/uL (ref 4.20–5.80)
RDW: 13.4 % (ref 11.0–15.0)
WBC: 8.8 10*3/uL (ref 3.8–10.8)

## 2016-05-14 LAB — COMPREHENSIVE METABOLIC PANEL
ALT: 24 U/L (ref 9–46)
AST: 19 U/L (ref 10–35)
Albumin: 3.9 g/dL (ref 3.6–5.1)
Alkaline Phosphatase: 89 U/L (ref 40–115)
BUN: 15 mg/dL (ref 7–25)
CO2: 26 mmol/L (ref 20–31)
Calcium: 9.4 mg/dL (ref 8.6–10.3)
Chloride: 99 mmol/L (ref 98–110)
Creat: 0.8 mg/dL (ref 0.70–1.33)
Glucose, Bld: 332 mg/dL — ABNORMAL HIGH (ref 65–99)
Potassium: 4.6 mmol/L (ref 3.5–5.3)
Sodium: 133 mmol/L — ABNORMAL LOW (ref 135–146)
Total Bilirubin: 0.4 mg/dL (ref 0.2–1.2)
Total Protein: 7.2 g/dL (ref 6.1–8.1)

## 2016-05-14 LAB — URIC ACID: Uric Acid, Serum: 3.5 mg/dL — ABNORMAL LOW (ref 4.0–8.0)

## 2016-05-14 LAB — GLUCOSE, POCT (MANUAL RESULT ENTRY): POC Glucose: 353 mg/dl — AB (ref 70–99)

## 2016-05-14 MED ORDER — LOSARTAN POTASSIUM 50 MG PO TABS: ORAL_TABLET | ORAL | 3 refills | Status: DC

## 2016-05-14 MED ORDER — MELOXICAM 15 MG PO TABS: 15.0000 mg | ORAL_TABLET | Freq: Every day | ORAL | 0 refills | Status: DC

## 2016-05-14 MED ORDER — INSULIN ASPART 100 UNIT/ML ~~LOC~~ SOLN
20.0000 [IU] | Freq: Once | SUBCUTANEOUS | Status: AC
  Administered 2016-05-14: 20 [IU] via SUBCUTANEOUS

## 2016-05-14 MED ORDER — INSULIN GLARGINE 100 UNIT/ML SOLOSTAR PEN: 40.0000 [IU] | PEN_INJECTOR | Freq: Every morning | SUBCUTANEOUS | 2 refills | Status: DC

## 2016-05-14 MED FILL — MELOXICAM 15 MG TABLET: 15 | 30 days supply | Qty: 30 | Fill #0

## 2016-05-14 NOTE — Patient Instructions (Signed)
Check blood pressure 1-2 times per week and record and bring to next visit.  Check blood sugar daily and record and bring to next visit

## 2016-05-14 NOTE — Progress Notes (Signed)
Jeffery [name], is a 56 y.o. male  [id number]  [id number]  DOB - [birth year]  Subjective:  Chief Complaint and HPI: Jeffery [name] is a 56 y.o. male here today for R ankle/foot pain for a little over a week since twisting his ankle.  He is able to bear weight.  Pain is worse with standing and walking.  He says his ankle was hurting on and off prior to twisting it and that it often flares up and turns red and he can't move it as much as normal.    He does not check his blood sugars regularly or often.  He says he is compliant with his Lantus 35 units "most of the time." He says he only takes 6 untis of Novolog about once a day(he is supposed to take 6 units with each meal.)  He denies S/sx hyper/hypoglycemia.    ED/Hospital notes reviewed.   Social History: employed as Psychiatrist projects   ROS:   Constitutional:  No f/c, No night sweats, No unexplained weight loss. EENT:  No vision changes, No blurry vision, No hearing changes. No mouth, throat, or ear problems.  Respiratory: No cough, No SOB Cardiac: No CP, no palpitations GI:  No abd pain, No N/V/D. GU: No Urinary s/sx Musculoskeletal: +R ankle pain Neuro: No headache, no dizziness, no motor weakness.  Skin: No rash Endocrine:  No polydipsia. No polyuria.  Psych: Denies SI/HI  No problems updated.  ALLERGIES: No Known Allergies  PAST MEDICAL HISTORY: Past Medical History:  Diagnosis Date  . DDD (degenerative disc disease), lumbar   . Diabetes mellitus without complication ([place])   . Hypertension   . Rotator cuff disorder     MEDICATIONS AT HOME: Prior to Admission medications   Medication Sig Start Date End Date Taking? Authorizing Provider  aspirin EC 325 MG tablet Take 1 tablet (325 mg total) by mouth 2 (two) times daily. Patient not taking: Reported on 02/27/2016 03/28/15   [name], MD  cyclobenzaprine (FLEXERIL) 10 MG tablet Take 1 tablet (10 mg total) by mouth 3 (three)  times daily as needed for muscle spasms. Patient not taking: Reported on 02/27/2016 10/28/15   Arnoldo Morale, MD  insulin aspart (NOVOLOG) 100 UNIT/ML injection Inject 6 Units into the skin 3 (three) times daily with meals. 09/18/15   Tresa Garter, MD  Insulin Glargine (LANTUS SOLOSTAR) 100 UNIT/ML Solostar Pen Inject 40 Units into the skin every morning. 05/14/16   Argentina Donovan, PA-C  insulin lispro (HUMALOG) 100 UNIT/ML injection Inject 0.06 mLs (6 Units total) into the skin 3 (three) times daily with meals. 04/24/16   Olugbemiga Essie Christine, MD  Insulin Pen Needle (B-D ULTRAFINE III SHORT PEN) 31G X 8 MM MISC 1 each by Does not apply route 3 (three) times daily. 11/12/14   Arnoldo Morale, MD  Insulin Syringe-Needle U-100 (TRUEPLUS INSULIN SYRINGE) 30G X 5/16" 0.5 ML MISC Use as directed 3 times daily 12/14/14   Arnoldo Morale, MD  losartan (COZAAR) 50 MG tablet 1 tablet daily 05/14/16   Argentina Donovan, PA-C  meloxicam (MOBIC) 15 MG tablet Take 1 tablet (15 mg total) by mouth daily. 05/14/16   Argentina Donovan, PA-C  TRUEPLUS INSULIN SYRINGE 30G X 5/16" 0.5 ML MISC USE AS DIRECTED 3 TIMES DAILY 04/03/16   Tresa Garter, MD     Objective:  EXAM:   Vitals:   05/14/16 1615  BP: (!) 147/87  Pulse: 68  Resp: 16  Temp: 97.6 F (36.4 C)  TempSrc: Oral  SpO2: 95%  Weight: (!) 303 lb 12.8 oz (137.8 kg)    General appearance : A&OX3. NAD. Non-toxic-appearing HEENT: Atraumatic and Normocephalic.  PERRLA. EOM intact.  TM clear B. Mouth-MMM, post pharynx WNL w/o erythema, No PND. Neck: supple, no JVD. No cervical lymphadenopathy. No thyromegaly Chest/Lungs:  Breathing-non-labored, Good air entry bilaterally, breath sounds normal without rales, rhonchi, or wheezing  CVS: S1 S2 regular, no murmurs, gallops, rubs  Extremities: Bilateral Lower Ext shows no edema, both legs are warm to touch with = pulse throughout.  R foot and ankle-NV intact.  R ankle is swollen and mildly erythematous.  ROM and  rotation are about 50% of normal.  No TTP medial/lateral malleolus.  There is mild TTP at the ATF ligament.   Neurology:  CN II-XII grossly intact, Non focal.   Psych:  TP linear. J/I WNL. Normal speech. Appropriate eye contact and affect.  Skin:  No Rash  Data Review Lab Results  Component Value Date   HGBA1C 11.1 05/14/2016   HGBA1C 11.4 10/28/2015   HGBA1C 7.9 (H) 03/18/2015     Assessment & Plan   1. Uncontrolled type 2 diabetes mellitus with complication, unspecified long term insulin use status ([facility]) Uncontrolled.  I have advised him to take all meds as directed without skipping doses.  This is imperative.  He also needs to work on diet. - POCT urinalysis dipstick - POCT glucose (manual entry) - POCT glycosylated hemoglobin (Hb A1C) - insulin aspart (novoLOG) injection 20 Units; Inject 0.2 mLs (20 Units total) into the skin once. Increase dose- Insulin Glargine (LANTUS SOLOSTAR) 100 UNIT/ML Solostar Pen; Inject 40 Units into the skin every morning.  Dispense: 15 mL; Refill: 2 - Comprehensive metabolic panel - CBC with Differential/Platelet Check blood pressure 1-2 times per week and record and bring to next visit. Check blood sugar daily and record and bring to next visit  2. Essential hypertension Uncontrolled - Comprehensive metabolic panel - CBC with Differential/Platelet Increase dose from 25 mg daily to- losartan (COZAAR) 50 MG tablet; 1 tablet daily  Dispense: 30 tablet; Refill: 3  3. Acute right ankle pain Likely just a sprain.  No indication for xray.  He has an ankle support he can wear.  RICE therapy.   - Uric Acid(checking due to intermittent "flares" of ankle pain - meloxicam (MOBIC) 15 MG tablet; Take 1 tablet (15 mg total) by mouth daily.  Dispense: 30 tablet; Refill: 0  Patient have been counseled extensively about nutrition and exercise  Return in about 3 weeks (around 06/04/2016) for F/up Dr Jarold Song;  DM and htn and ankle pain.  The patient was given clear  instructions to go to ER or return to medical center if symptoms don't improve, worsen or new problems develop. The patient verbalized understanding. The patient was told to call to get lab results if they haven't heard anything in the next week.     Freeman Caldron, PA-C [facility], [place]   05/14/2016, 4:51 PMPatient ID: Jeffery [name], male   DOB: 15-Dec-1960, 56 y.o.   MRN: [id number]

## 2016-05-15 ENCOUNTER — Other Ambulatory Visit: Payer: Self-pay | Admitting: *Deleted

## 2016-05-15 MED FILL — $LANTUS SOLOSTAR 100 UNITS/: 100 | 14 days supply | Qty: 6 | Fill #0

## 2016-05-15 NOTE — Telephone Encounter (Signed)
PRESCRIPTION FILLED OUT ON APPLICATION.

## 2016-05-21 ENCOUNTER — Telehealth: Payer: Self-pay

## 2016-05-21 NOTE — Telephone Encounter (Signed)
Contacted pt to go over lab results pt didn't answer lvm asking pt to give me a call at his earliest convenience  

## 2016-05-25 ENCOUNTER — Other Ambulatory Visit: Payer: Self-pay | Admitting: Family Medicine

## 2016-05-25 ENCOUNTER — Telehealth: Payer: Self-pay

## 2016-05-25 ENCOUNTER — Other Ambulatory Visit: Payer: Self-pay | Admitting: *Deleted

## 2016-05-25 DIAGNOSIS — I10 Essential (primary) hypertension: Secondary | ICD-10-CM

## 2016-05-25 DIAGNOSIS — E1165 Type 2 diabetes mellitus with hyperglycemia: Secondary | ICD-10-CM

## 2016-05-25 DIAGNOSIS — E118 Type 2 diabetes mellitus with unspecified complications: Principal | ICD-10-CM

## 2016-05-25 MED ORDER — INSULIN GLARGINE 100 UNIT/ML SOLOSTAR PEN: 40.0000 [IU] | PEN_INJECTOR | Freq: Every morning | SUBCUTANEOUS | 3 refills | Status: DC

## 2016-05-25 NOTE — Telephone Encounter (Signed)
PRINTED FOR PASS PROGRAM 

## 2016-05-25 NOTE — Telephone Encounter (Signed)
Contacted pt to go over lab results pt is aware of results and doesn't have any questions or concerns 

## 2016-05-28 MED FILL — LOSARTAN POTASSIUM 50 MG TA: 50 | 30 days supply | Qty: 30 | Fill #0

## 2016-05-28 MED FILL — !NOVOLOG 100UNITS/ML VIAL: 100/ML | 28 days supply | Qty: 10 | Fill #2

## 2016-05-28 MED FILL — !LANTUS SOLOSTAR 100UNITS/M: 100 | 8 days supply | Qty: 3 | Fill #3

## 2016-06-05 ENCOUNTER — Encounter: Payer: Self-pay | Admitting: Family Medicine

## 2016-06-05 ENCOUNTER — Ambulatory Visit: Payer: Self-pay | Attending: Family Medicine | Admitting: Family Medicine

## 2016-06-05 ENCOUNTER — Ambulatory Visit (HOSPITAL_COMMUNITY)
Admission: RE | Admit: 2016-06-05 | Discharge: 2016-06-05 | Disposition: A | Discharge status: Home / Self Care | Payer: Self-pay | Source: Ambulatory Visit | Attending: Family Medicine | Admitting: Family Medicine

## 2016-06-05 VITALS — BP 143/70 | HR 103 | Temp 97.8°F | Ht 75.0 in | Wt 304.5 lb

## 2016-06-05 DIAGNOSIS — Z794 Long term (current) use of insulin: Secondary | ICD-10-CM | POA: Insufficient documentation

## 2016-06-05 DIAGNOSIS — Z6838 Body mass index (BMI) 38.0-38.9, adult: Secondary | ICD-10-CM | POA: Insufficient documentation

## 2016-06-05 DIAGNOSIS — S93491D Sprain of other ligament of right ankle, subsequent encounter: Secondary | ICD-10-CM | POA: Insufficient documentation

## 2016-06-05 DIAGNOSIS — IMO0002 Reserved for concepts with insufficient information to code with codable children: Secondary | ICD-10-CM

## 2016-06-05 DIAGNOSIS — E118 Type 2 diabetes mellitus with unspecified complications: Secondary | ICD-10-CM

## 2016-06-05 DIAGNOSIS — M7989 Other specified soft tissue disorders: Secondary | ICD-10-CM | POA: Insufficient documentation

## 2016-06-05 DIAGNOSIS — X58XXXD Exposure to other specified factors, subsequent encounter: Secondary | ICD-10-CM | POA: Insufficient documentation

## 2016-06-05 DIAGNOSIS — E1165 Type 2 diabetes mellitus with hyperglycemia: Secondary | ICD-10-CM | POA: Insufficient documentation

## 2016-06-05 DIAGNOSIS — I10 Essential (primary) hypertension: Secondary | ICD-10-CM | POA: Insufficient documentation

## 2016-06-05 DIAGNOSIS — Z7982 Long term (current) use of aspirin: Secondary | ICD-10-CM | POA: Insufficient documentation

## 2016-06-05 DIAGNOSIS — Z79899 Other long term (current) drug therapy: Secondary | ICD-10-CM | POA: Insufficient documentation

## 2016-06-05 LAB — GLUCOSE, POCT (MANUAL RESULT ENTRY): POC Glucose: 186 mg/dl — AB (ref 70–99)

## 2016-06-05 LAB — POCT GLYCOSYLATED HEMOGLOBIN (HGB A1C): Hemoglobin A1C: 10.3

## 2016-06-05 MED ORDER — INSULIN ASPART 100 UNIT/ML ~~LOC~~ SOLN: 6.0000 [IU] | Freq: Three times a day (TID) | SUBCUTANEOUS | 3 refills | Status: DC

## 2016-06-05 MED ORDER — INSULIN GLARGINE 100 UNIT/ML SOLOSTAR PEN: 45.0000 [IU] | PEN_INJECTOR | Freq: Every morning | SUBCUTANEOUS | 3 refills | Status: DC

## 2016-06-05 MED ORDER — PREDNISONE 20 MG PO TABS: 20.0000 mg | ORAL_TABLET | Freq: Two times a day (BID) | ORAL | 0 refills | Status: DC

## 2016-06-05 MED ORDER — TRAMADOL HCL 50 MG PO TABS
50.0000 mg | ORAL_TABLET | Freq: Two times a day (BID) | ORAL | 0 refills | Status: DC | PRN
Start: 2016-06-05 — End: 2016-06-19

## 2016-06-05 MED FILL — traMADol HCL 50 MG TABS: 50 | 20 days supply | Qty: 40 | Fill #0

## 2016-06-05 MED FILL — ?PREDNISONE 20 MG TABLET: 20 | 5 days supply | Qty: 10 | Fill #0

## 2016-06-05 NOTE — Patient Instructions (Signed)
Ankle Sprain An ankle sprain is a stretch or tear in one of the tough, fiber-like tissues (ligaments) in the ankle. The ligaments in your ankle help to hold the bones of the ankle together. What are the causes? This condition is often caused by stepping on or falling on the outer edge of the foot. What increases the risk? This condition is more likely to develop in people who play sports. What are the signs or symptoms? Symptoms of this condition include:  Pain in your ankle.  Swelling.  Bruising. Bruising may develop right after you sprain your ankle or 1-2 days later.  Trouble standing or walking, especially when you turn or change directions. How is this diagnosed? This condition is diagnosed with a physical exam. During the exam, your health care provider will press on certain parts of your foot and ankle and try to move them in certain ways. X-rays may be taken to see how severe the sprain is and to check for broken bones. How is this treated? This condition may be treated with:  A brace. This is used to keep the ankle from moving until it heals.  An elastic bandage. This is used to support the ankle.  Crutches.  Pain medicine.  Surgery. This may be needed if the sprain is severe.  Physical therapy. This may help to improve the range of motion in the ankle. Follow these instructions at home:  Rest your ankle.  Take over-the-counter and prescription medicines only as told by your health care provider.  For 2-3 days, keep your ankle raised (elevated) above the level of your heart as much as possible.  If directed, apply ice to the area:  Put ice in a plastic bag.  Place a towel between your skin and the bag.  Leave the ice on for 20 minutes, 2-3 times a day.  If you were given a brace:  Wear it as directed.  Remove it to shower or bathe.  Try not to move your ankle much, but wiggle your toes from time to time. This helps to prevent swelling.  If you were  given an elastic bandage (dressing):  Remove it to shower or bathe.  Try not to move your ankle much, but wiggle your toes from time to time. This helps to prevent swelling.  Adjust the dressing to make it more comfortable if it feels too tight.  Loosen the dressing if you have numbness or tingling in your foot, or if your foot becomes cold and blue.  If you have crutches, use them as told by your health care provider. Continue to use them until you can walk without feeling pain in your ankle. Contact a health care provider if:  You have rapidly increasing bruising or swelling.  Your pain is not relieved with medicine. Get help right away if:  Your toes or foot becomes numb or blue.  You have severe pain that gets worse. This information is not intended to replace advice given to you by your health care provider. Make sure you discuss any questions you have with your health care provider. Document Released: 03/16/2005 Document Revised: 07/24/2015 Document Reviewed: 10/16/2014 Elsevier Interactive Patient Education  2017 [employer].

## 2016-06-05 NOTE — Progress Notes (Signed)
Subjective:  Patient ID: Jeffery [name], male    DOB: 1961-02-20  Age: 56 y.o. MRN: [id number]  CC: Diabetes; Hypertension; and Ankle Pain (right)   HPI Jeffery [name] is a 56 year old male with a history of hypertension, type 2 diabetes mellitus, obesity who presents with right ankle pain which he has had for a little over 6 weeks ever since he attempted to get up from a kneeling position and felt "something happened to his right ankle". It hurts to bear weight on it and ankle is swollen and red. He was placed on meloxicam at his last visit which is not helping. Uric acid came back normal. Denies calf Pain, fever or ulcers in that foot.  He has been compliant with his antihypertensives and his Lantus and denies hypoglycemia, numbness in extremities. He does not exercise and has not been compliant with a diabetic diet of low sodium diet.  Past Medical History:  Diagnosis Date  . DDD (degenerative disc disease), lumbar   . Diabetes mellitus without complication ([place])   . Hypertension   . Rotator cuff disorder     Past Surgical History:  Procedure Laterality Date  . AMPUTATION Left 10/02/2014   Procedure: Left Third toe amputation ;  Surgeon: [name], MD;  Location: [facility];  Service: Orthopedics;  Laterality: Left;  Regular bed, wants to follow hip  . ANTERIOR CRUCIATE LIGAMENT REPAIR Right 90   reconstruction  . APPLICATION OF WOUND VAC Left 10/02/2014   Procedure: APPLICATION OF WOUND VAC; toe Surgeon: [name], MD;  Location: [facility];  Service: Orthopedics;  Laterality: Left;  . I&D EXTREMITY Left 10/05/2014   Procedure: IRRIGATION AND DEBRIDEMENT LEFT FOOT;  Surgeon: [name], MD;  Location: [facility];  Service: Orthopedics;  Laterality: Left;  . KNEE ARTHROSCOPY W/ ACL RECONSTRUCTION Right   . TOTAL KNEE ARTHROPLASTY Right 03/28/2015  . TOTAL KNEE ARTHROPLASTY Right 03/28/2015   Procedure: RIGHT TOTAL KNEE ARTHROPLASTY;  Surgeon: [name], MD;  Location: [facility];   Service: Orthopedics;  Laterality: Right;    No Known Allergies   Outpatient Medications Prior to Visit  Medication Sig Dispense Refill  . Insulin Pen Needle (B-D ULTRAFINE III SHORT PEN) 31G X 8 MM MISC 1 each by Does not apply route 3 (three) times daily. 100 each 5  . Insulin Syringe-Needle U-100 (TRUEPLUS INSULIN SYRINGE) 30G X 5/16" 0.5 ML MISC Use as directed 3 times daily 100 each 5  . losartan (COZAAR) 50 MG tablet 1 tablet daily 30 tablet 3  . meloxicam (MOBIC) 15 MG tablet Take 1 tablet (15 mg total) by mouth daily. 30 tablet 0  . TRUEPLUS INSULIN SYRINGE 30G X 5/16" 0.5 ML MISC USE AS DIRECTED 3 TIMES DAILY 100 each 0  . insulin aspart (NOVOLOG) 100 UNIT/ML injection Inject 6 Units into the skin 3 (three) times daily with meals. 30 mL 3  . Insulin Glargine (LANTUS SOLOSTAR) 100 UNIT/ML Solostar Pen Inject 40 Units into the skin every morning. 45 mL 3  . aspirin EC 325 MG tablet Take 1 tablet (325 mg total) by mouth 2 (two) times daily. (Patient not taking: Reported on 02/27/2016) 84 tablet 0  . cyclobenzaprine (FLEXERIL) 10 MG tablet Take 1 tablet (10 mg total) by mouth 3 (three) times daily as needed for muscle spasms. (Patient not taking: Reported on 02/27/2016) 30 tablet 2  . insulin lispro (HUMALOG) 100 UNIT/ML injection Inject 0.06 mLs (6 Units total) into the skin 3 (  three) times daily with meals. (Patient not taking: Reported on 06/05/2016) 30 mL 3   No facility-administered medications prior to visit.     ROS Review of Systems  Constitutional: Negative for activity change and appetite change.  HENT: Negative for sinus pressure and sore throat.   Eyes: Negative for visual disturbance.  Respiratory: Negative for cough, chest tightness and shortness of breath.   Cardiovascular: Negative for chest pain and leg swelling.  Gastrointestinal: Negative for abdominal distention, abdominal pain, constipation and diarrhea.  Endocrine: Negative.   Genitourinary: Negative for  dysuria.  Musculoskeletal:       See hpi  Skin: Negative for rash.  Allergic/Immunologic: Negative.   Neurological: Negative for weakness, light-headedness and numbness.  Psychiatric/Behavioral: Negative for dysphoric mood and suicidal ideas.    Objective:  BP (!) 143/70 (BP Location: Right Arm, Patient Position: Sitting, Cuff Size: Large)   Pulse (!) 103   Temp 97.8 F (36.6 C) (Oral)   Ht 6\' 3"  (1.905 m)   Wt (!) 304 lb 8 oz (138.1 kg)   SpO2 99%   BMI 38.06 kg/m   BP/Weight 06/05/2016 05/14/2016 1/60/1093  Systolic BP 235 573 -  Diastolic BP 70 87 -  Wt. (Lbs) 304.5 303.8 272  BMI 38.06 41.2 36.89      Physical Exam  Constitutional: He is oriented to person, place, and time. He appears well-developed and well-nourished.  Cardiovascular: Normal rate, normal heart sounds and intact distal pulses.   No murmur heard. Pulmonary/Chest: Effort normal and breath sounds normal. He has no wheezes. He has no rales. He exhibits no tenderness.  Abdominal: Soft. Bowel sounds are normal. He exhibits no distension and no mass. There is no tenderness.  Musculoskeletal: He exhibits edema (2+ pitting edema of the right dorsum, ankle and lower half of the right leg; left ankle is normal) and tenderness (tenderness to palpation ofmedial malleolus  and on range of motion of the right ankle; left is normal).  Negative Homans sign Erythema of right ankle  Neurological: He is alert and oriented to person, place, and time.   Lab Results  Component Value Date   HGBA1C 10.3 06/05/2016     Assessment & Plan:   1. Uncontrolled type 2 diabetes mellitus with complication, with long-term current use of insulin (HCC) Uncontrolled with A1c of 10.3 Increased dose of Lantus to 45 units Diabetic diet, lifestyle modification and weight loss Exercise-30 minutes and 5 days of the week - Glucose (CBG) - HgB A1c - insulin aspart (NOVOLOG) 100 UNIT/ML injection; Inject 6 Units into the skin 3 (three)  times daily with meals.  Dispense: 30 mL; Refill: 3 - Insulin Glargine (LANTUS SOLOSTAR) 100 UNIT/ML Solostar Pen; Inject 45 Units into the skin every morning.  Dispense: 5 pen; Refill: 3  2. Sprain of other ligament of right ankle, subsequent encounter Placed on tramadol Elevate right foot If x-rays negative for related to order MRI - DG Ankle Complete Right; Future  3. Essential hypertension Slightly above goal of less than 130/80 Elevation could be secondary to pain Continue antihypertensive and low-sodium diet  4. Morbid obesity ([place]) Advised to work on reducing portions Exercise as tolerated   Meds ordered this encounter  Medications  . insulin aspart (NOVOLOG) 100 UNIT/ML injection    Sig: Inject 6 Units into the skin 3 (three) times daily with meals.    Dispense:  30 mL    Refill:  3  . Insulin Glargine (LANTUS SOLOSTAR) 100 UNIT/ML Solostar Pen  Sig: Inject 45 Units into the skin every morning.    Dispense:  5 pen    Refill:  3  . predniSONE (DELTASONE) 20 MG tablet    Sig: Take 1 tablet (20 mg total) by mouth 2 (two) times daily with a meal.    Dispense:  10 tablet    Refill:  0  . traMADol (ULTRAM) 50 MG tablet    Sig: Take 1 tablet (50 mg total) by mouth every 12 (twelve) hours as needed.    Dispense:  40 tablet    Refill:  0    Follow-up: Return in about 2 weeks (around 06/19/2016) for Follow-up of right ankle sprain.   Arnoldo Morale MD

## 2016-06-09 ENCOUNTER — Telehealth: Payer: Self-pay

## 2016-06-09 NOTE — Telephone Encounter (Signed)
-----   Message from Arnoldo Morale, MD sent at 06/08/2016 12:07 PM EDT ----- Foot x-ray revealed spurs anteriorly and in the sole of his foot and soft tissue swelling; this could explain his pain.

## 2016-06-09 NOTE — Telephone Encounter (Signed)
Writer called and spoke with patient regarding his xray and lab result.  Patient stated understanding and states that he is anxious to meet with Dr. Jarold [name] at his next visit because he continues to have significant ankle pain and swelling.

## 2016-06-15 MED FILL — !NOVOLOG 100UNITS/ML VIAL: 100/ML | 28 days supply | Qty: 10 | Fill #0

## 2016-06-17 MED FILL — !LANTUS SOLOSTAR 100UNITS/M: 100 | 33 days supply | Qty: 15 | Fill #0

## 2016-06-19 ENCOUNTER — Ambulatory Visit: Payer: Self-pay

## 2016-06-19 ENCOUNTER — Ambulatory Visit: Payer: Self-pay | Attending: Family Medicine | Admitting: Family Medicine

## 2016-06-19 ENCOUNTER — Telehealth: Payer: Self-pay | Admitting: *Deleted

## 2016-06-19 ENCOUNTER — Encounter: Payer: Self-pay | Admitting: Family Medicine

## 2016-06-19 VITALS — BP 131/79 | HR 101 | Temp 98.0°F | Resp 16

## 2016-06-19 DIAGNOSIS — Z89422 Acquired absence of other left toe(s): Secondary | ICD-10-CM | POA: Insufficient documentation

## 2016-06-19 DIAGNOSIS — Z794 Long term (current) use of insulin: Secondary | ICD-10-CM | POA: Insufficient documentation

## 2016-06-19 DIAGNOSIS — M8580 Other specified disorders of bone density and structure, unspecified site: Secondary | ICD-10-CM | POA: Insufficient documentation

## 2016-06-19 DIAGNOSIS — Z79899 Other long term (current) drug therapy: Secondary | ICD-10-CM | POA: Insufficient documentation

## 2016-06-19 DIAGNOSIS — M25571 Pain in right ankle and joints of right foot: Secondary | ICD-10-CM | POA: Insufficient documentation

## 2016-06-19 DIAGNOSIS — E1165 Type 2 diabetes mellitus with hyperglycemia: Secondary | ICD-10-CM | POA: Insufficient documentation

## 2016-06-19 DIAGNOSIS — Z9889 Other specified postprocedural states: Secondary | ICD-10-CM | POA: Insufficient documentation

## 2016-06-19 DIAGNOSIS — IMO0002 Reserved for concepts with insufficient information to code with codable children: Secondary | ICD-10-CM

## 2016-06-19 DIAGNOSIS — E119 Type 2 diabetes mellitus without complications: Secondary | ICD-10-CM | POA: Insufficient documentation

## 2016-06-19 DIAGNOSIS — M5136 Other intervertebral disc degeneration, lumbar region: Secondary | ICD-10-CM | POA: Insufficient documentation

## 2016-06-19 DIAGNOSIS — Z7982 Long term (current) use of aspirin: Secondary | ICD-10-CM | POA: Insufficient documentation

## 2016-06-19 DIAGNOSIS — I10 Essential (primary) hypertension: Secondary | ICD-10-CM | POA: Insufficient documentation

## 2016-06-19 DIAGNOSIS — X58XXXA Exposure to other specified factors, initial encounter: Secondary | ICD-10-CM | POA: Insufficient documentation

## 2016-06-19 DIAGNOSIS — E118 Type 2 diabetes mellitus with unspecified complications: Secondary | ICD-10-CM

## 2016-06-19 DIAGNOSIS — S93491A Sprain of other ligament of right ankle, initial encounter: Secondary | ICD-10-CM | POA: Insufficient documentation

## 2016-06-19 DIAGNOSIS — S93491D Sprain of other ligament of right ankle, subsequent encounter: Secondary | ICD-10-CM

## 2016-06-19 MED ORDER — TRUE METRIX METER DEVI: 1.0000 | Freq: Three times a day (TID) | 0 refills | Status: DC

## 2016-06-19 MED ORDER — TRAMADOL HCL 50 MG PO TABS: 50.0000 mg | ORAL_TABLET | Freq: Two times a day (BID) | ORAL | 0 refills | Status: DC | PRN

## 2016-06-19 MED ORDER — TRUEPLUS LANCETS 28G MISC: 1.0000 | Freq: Three times a day (TID) | 12 refills | Status: DC

## 2016-06-19 MED ORDER — GLUCOSE BLOOD VI STRP: ORAL_STRIP | 12 refills | Status: DC

## 2016-06-19 NOTE — Progress Notes (Signed)
Subjective:  Patient ID: Jeffery [name], male    DOB: 1960-10-27  Age: 56 y.o. MRN: [id number]  CC: Ankle Pain   HPI Jeffery [name] is a 56 year-old male with a history of hypertension, type 2 diabetes mellitus (A1c 10.3), obesity who presents with right ankle pain which he has had for over 6 weeks ever since he attempted to get up from a kneeling position and felt "something happened to his right ankle". It hurts to bear weight on it and ankle is swollen and red. Completed a course of prednisone and tramadol with no relief in symptoms. Right ankle x-ray revealed diffuse soft tissue swelling, osteopenia, degenerative spurring on dorsal aspect of midfoot and prominent plantar calcaneal spur. He is scheduled to see orthopedics- Dr Erlinda Hong in 4 days.  He has been compliant with his antihypertensives and his Lantus and denies hypoglycemia, numbness in extremities. He does not exercise and has not been compliant with a diabetic diet of low sodium diet.  Past Medical History:  Diagnosis Date  . DDD (degenerative disc disease), lumbar   . Diabetes mellitus without complication ([place])   . Hypertension   . Rotator cuff disorder    Past Surgical History:  Procedure Laterality Date  . AMPUTATION Left 10/02/2014   Procedure: Left Third toe amputation ;  Surgeon: [name], MD;  Location: [facility];  Service: Orthopedics;  Laterality: Left;  Regular bed, wants to follow hip  . ANTERIOR CRUCIATE LIGAMENT REPAIR Right 90   reconstruction  . APPLICATION OF WOUND VAC Left 10/02/2014   Procedure: APPLICATION OF WOUND VAC; toe Surgeon: [name], MD;  Location: [facility];  Service: Orthopedics;  Laterality: Left;  . I&D EXTREMITY Left 10/05/2014   Procedure: IRRIGATION AND DEBRIDEMENT LEFT FOOT;  Surgeon: [name], MD;  Location: [facility];  Service: Orthopedics;  Laterality: Left;  . KNEE ARTHROSCOPY W/ ACL RECONSTRUCTION Right   . TOTAL KNEE ARTHROPLASTY Right 03/28/2015  . TOTAL KNEE ARTHROPLASTY  Right 03/28/2015   Procedure: RIGHT TOTAL KNEE ARTHROPLASTY;  Surgeon: [name], MD;  Location: [facility];  Service: Orthopedics;  Laterality: Right;     Outpatient Medications Prior to Visit  Medication Sig Dispense Refill  . insulin aspart (NOVOLOG) 100 UNIT/ML injection Inject 6 Units into the skin 3 (three) times daily with meals. 30 mL 3  . Insulin Glargine (LANTUS SOLOSTAR) 100 UNIT/ML Solostar Pen Inject 45 Units into the skin every morning. 5 pen 3  . Insulin Pen Needle (B-D ULTRAFINE III SHORT PEN) 31G X 8 MM MISC 1 each by Does not apply route 3 (three) times daily. 100 each 5  . Insulin Syringe-Needle U-100 (TRUEPLUS INSULIN SYRINGE) 30G X 5/16" 0.5 ML MISC Use as directed 3 times daily 100 each 5  . losartan (COZAAR) 50 MG tablet 1 tablet daily 30 tablet 3  . traMADol (ULTRAM) 50 MG tablet Take 1 tablet (50 mg total) by mouth every 12 (twelve) hours as needed. 40 tablet 0  . aspirin EC 325 MG tablet Take 1 tablet (325 mg total) by mouth 2 (two) times daily. (Patient not taking: Reported on 02/27/2016) 84 tablet 0  . cyclobenzaprine (FLEXERIL) 10 MG tablet Take 1 tablet (10 mg total) by mouth 3 (three) times daily as needed for muscle spasms. (Patient not taking: Reported on 02/27/2016) 30 tablet 2  . insulin lispro (HUMALOG) 100 UNIT/ML injection Inject 0.06 mLs (6 Units total) into the skin 3 (three) times daily with meals. (Patient  not taking: Reported on 06/05/2016) 30 mL 3  . meloxicam (MOBIC) 15 MG tablet Take 1 tablet (15 mg total) by mouth daily. (Patient not taking: Reported on 06/19/2016) 30 tablet 0  . TRUEPLUS INSULIN SYRINGE 30G X 5/16" 0.5 ML MISC USE AS DIRECTED 3 TIMES DAILY 100 each 0  . predniSONE (DELTASONE) 20 MG tablet Take 1 tablet (20 mg total) by mouth 2 (two) times daily with a meal. (Patient not taking: Reported on 06/19/2016) 10 tablet 0   No facility-administered medications prior to visit.     ROS Review of Systems Constitutional: Negative for activity  change and appetite change.  HENT: Negative for sinus pressure and sore throat.   Eyes: Negative for visual disturbance.  Respiratory: Negative for cough, chest tightness and shortness of breath.   Cardiovascular: Negative for chest pain and leg swelling.  Gastrointestinal: Negative for abdominal distention, abdominal pain, constipation and diarrhea.  Endocrine: Negative.   Genitourinary: Negative for dysuria.  Musculoskeletal:       See hpi  Skin: Negative for rash.  Allergic/Immunologic: Negative.   Neurological: Negative for weakness, light-headedness and numbness.  Psychiatric/Behavioral: Negative for dysphoric mood and suicidal ideas.   Objective:  BP 131/79 (BP Location: Left Arm, Patient Position: Sitting, Cuff Size: Large)   Pulse (!) 101   Temp 98 F (36.7 C) (Oral)   Resp 16   SpO2 98%   BP/Weight 06/19/2016 06/05/2016 5/85/2778  Systolic BP 242 353 614  Diastolic BP 79 70 87  Wt. (Lbs) - 304.5 303.8  BMI - 38.06 41.2     Physical Exam Constitutional: He is oriented to person, place, and time. He appears well-developed and well-nourished.  Cardiovascular: Normal rate, normal heart sounds and intact distal pulses.   No murmur heard. Pulmonary/Chest: Effort normal and breath sounds normal. He has no wheezes. He has no rales. He exhibits no tenderness.  Abdominal: Soft. Bowel sounds are normal. He exhibits no distension and no mass. There is no tenderness.  Musculoskeletal: He exhibits edema (2+ pitting edema of bilateral right malleoli, the right dorsum of ankle ; left ankle is normal) and tenderness (tenderness to palpation of both malleoli  and on range of motion of the right ankle; left is normal).  Erythema of right ankle  Neurological: He is alert and oriented to person, place, and time.    Assessment & Plan:   1. Sprain of other ligament of right ankle, subsequent encounter Apply ice, elevate ankle Keep upcoming appointment with orthopedics-Dr. Erlinda Hong - traMADol  (ULTRAM) 50 MG tablet; Take 1 tablet (50 mg total) by mouth every 12 (twelve) hours as needed.  Dispense: 40 tablet; Refill: 0 - MR ANKLE RIGHT WO CONTRAST; Future  2. Uncontrolled type 2 diabetes mellitus with complication, with long-term current use of insulin ([place]) Uncontrolled with A1c of 10.3 Regimen changes were made at his last visit - glucose blood (TRUE METRIX BLOOD GLUCOSE TEST) test strip; Use 3 times daily before meals  Dispense: 100 each; Refill: 12 - TRUEPLUS LANCETS 28G MISC; 1 each by Does not apply route 3 (three) times daily before meals.  Dispense: 100 each; Refill: 12 - Blood Glucose Monitoring Suppl (TRUE METRIX METER) DEVI; 1 each by Does not apply route 3 (three) times daily before meals.  Dispense: 1 Device; Refill: 0   Meds ordered this encounter  Medications  . traMADol (ULTRAM) 50 MG tablet    Sig: Take 1 tablet (50 mg total) by mouth every 12 (twelve) hours as needed.  Dispense:  40 tablet    Refill:  0  . glucose blood (TRUE METRIX BLOOD GLUCOSE TEST) test strip    Sig: Use 3 times daily before meals    Dispense:  100 each    Refill:  12  . TRUEPLUS LANCETS 28G MISC    Sig: 1 each by Does not apply route 3 (three) times daily before meals.    Dispense:  100 each    Refill:  12  . Blood Glucose Monitoring Suppl (TRUE METRIX METER) DEVI    Sig: 1 each by Does not apply route 3 (three) times daily before meals.    Dispense:  1 Device    Refill:  0    Follow-up: Return in about 1 month (around 07/20/2016) for Follow-up on diabetes mellitus.   Arnoldo Morale MD

## 2016-06-19 NOTE — Patient Instructions (Signed)
Ankle Sprain An ankle sprain is a stretch or tear in one of the tough, fiber-like tissues (ligaments) in the ankle. The ligaments in your ankle help to hold the bones of the ankle together. What are the causes? This condition is often caused by stepping on or falling on the outer edge of the foot. What increases the risk? This condition is more likely to develop in people who play sports. What are the signs or symptoms? Symptoms of this condition include:  Pain in your ankle.  Swelling.  Bruising. Bruising may develop right after you sprain your ankle or 1-2 days later.  Trouble standing or walking, especially when you turn or change directions. How is this diagnosed? This condition is diagnosed with a physical exam. During the exam, your health care provider will press on certain parts of your foot and ankle and try to move them in certain ways. X-rays may be taken to see how severe the sprain is and to check for broken bones. How is this treated? This condition may be treated with:  A brace. This is used to keep the ankle from moving until it heals.  An elastic bandage. This is used to support the ankle.  Crutches.  Pain medicine.  Surgery. This may be needed if the sprain is severe.  Physical therapy. This may help to improve the range of motion in the ankle. Follow these instructions at home:  Rest your ankle.  Take over-the-counter and prescription medicines only as told by your health care provider.  For 2-3 days, keep your ankle raised (elevated) above the level of your heart as much as possible.  If directed, apply ice to the area:  Put ice in a plastic bag.  Place a towel between your skin and the bag.  Leave the ice on for 20 minutes, 2-3 times a day.  If you were given a brace:  Wear it as directed.  Remove it to shower or bathe.  Try not to move your ankle much, but wiggle your toes from time to time. This helps to prevent swelling.  If you were  given an elastic bandage (dressing):  Remove it to shower or bathe.  Try not to move your ankle much, but wiggle your toes from time to time. This helps to prevent swelling.  Adjust the dressing to make it more comfortable if it feels too tight.  Loosen the dressing if you have numbness or tingling in your foot, or if your foot becomes cold and blue.  If you have crutches, use them as told by your health care provider. Continue to use them until you can walk without feeling pain in your ankle. Contact a health care provider if:  You have rapidly increasing bruising or swelling.  Your pain is not relieved with medicine. Get help right away if:  Your toes or foot becomes numb or blue.  You have severe pain that gets worse. This information is not intended to replace advice given to you by your health care provider. Make sure you discuss any questions you have with your health care provider. Document Released: 03/16/2005 Document Revised: 07/24/2015 Document Reviewed: 10/16/2014 Elsevier Interactive Patient Education  2017 [employer].

## 2016-06-19 NOTE — Telephone Encounter (Signed)
Pt informed of appt for MRI/ Right ankle.  Saturday June 27, 2016 at [facility] Pt instructed to arrive 15 minutes prior to apt.  Pt verbalized understanding.

## 2016-06-23 ENCOUNTER — Encounter (INDEPENDENT_AMBULATORY_CARE_PROVIDER_SITE_OTHER): Payer: Self-pay | Admitting: Orthopaedic Surgery

## 2016-06-23 ENCOUNTER — Ambulatory Visit (INDEPENDENT_AMBULATORY_CARE_PROVIDER_SITE_OTHER): Payer: Self-pay | Admitting: Orthopaedic Surgery

## 2016-06-23 DIAGNOSIS — M25511 Pain in right shoulder: Secondary | ICD-10-CM

## 2016-06-23 MED FILL — traMADol HCL 50 MG TABS: 50 | 20 days supply | Qty: 40 | Fill #0

## 2016-06-23 NOTE — Addendum Note (Signed)
Addended by: Precious Bard on: 06/23/2016 01:06 PM   Modules accepted: Orders

## 2016-06-23 NOTE — Progress Notes (Signed)
Office Visit Note   Patient: Jeffery [name]           Date of Birth: 1960/12/10           MRN: [id number] Visit Date: 06/23/2016              Requested by: Arnoldo Morale, MD [place], [place] [address] PCP: Arnoldo Morale, MD   Assessment & Plan: Visit Diagnoses:  1. Pain in joint of right shoulder     Plan: MRI right shoulder ordered to evaluate for rotator cuff pathology. Hopefully he'll be able to get the shoulder MRI done at [facility]. time his ankle. I'll see him back in 2 weeks.  Follow-Up Instructions: Return in about 2 weeks (around 07/07/2016).   Orders:  No orders of the defined types were placed in this encounter.  No orders of the defined types were placed in this encounter.     Procedures: No procedures performed   Clinical Data: No additional findings.   Subjective: Chief Complaint  Patient presents with  . Right Ankle - Pain    Patient comes back today for continued right ankle and shoulder pain. He is scheduled for right ankle MRI the Saturday. His right shoulder continues to hurt. Denies any constitutional symptoms    Review of Systems   Objective: Vital Signs: There were no vitals taken for this visit.  Physical Exam  Ortho Exam Right shoulder exam shows mildly positive empty can testing. Positive impingement. Specialty Comments:  No specialty comments available.  Imaging: No results found.   PMFS History: Patient Active Problem List   Diagnosis Date Noted  . Morbid obesity ([place]) 06/05/2016  . S/P total knee replacement using cement 03/28/2015  . Knee osteoarthritis 11/12/2014  . Hypertension 10/15/2014  . Cellulitis of left lower extremity   . Osteomyelitis of toe of left foot ([place])   . Toe osteomyelitis, left ([place]) 10/01/2014  . Cellulitis of leg, left 10/01/2014  . Uncontrolled diabetes mellitus with complications ([place]) 53/64/6803  . Accelerated hypertension 10/01/2014  . Gangrene left third toe 10/01/2014    . SHOULDER JOINT INSTABILITY 05/27/2010  . SHOULDER PAIN, RIGHT 05/27/2010   Past Medical History:  Diagnosis Date  . DDD (degenerative disc disease), lumbar   . Diabetes mellitus without complication ([place])   . Hypertension   . Rotator cuff disorder     Family History  Problem Relation Age of Onset  . Diabetes Father   . Hypertension Father   . Heart failure Father     Past Surgical History:  Procedure Laterality Date  . AMPUTATION Left 10/02/2014   Procedure: Left Third toe amputation ;  Surgeon: [name], MD;  Location: [facility];  Service: Orthopedics;  Laterality: Left;  Regular bed, wants to follow hip  . ANTERIOR CRUCIATE LIGAMENT REPAIR Right 90   reconstruction  . APPLICATION OF WOUND VAC Left 10/02/2014   Procedure: APPLICATION OF WOUND VAC; toe Surgeon: [name], MD;  Location: [facility];  Service: Orthopedics;  Laterality: Left;  . I&D EXTREMITY Left 10/05/2014   Procedure: IRRIGATION AND DEBRIDEMENT LEFT FOOT;  Surgeon: [name], MD;  Location: [facility];  Service: Orthopedics;  Laterality: Left;  . KNEE ARTHROSCOPY W/ ACL RECONSTRUCTION Right   . TOTAL KNEE ARTHROPLASTY Right 03/28/2015  . TOTAL KNEE ARTHROPLASTY Right 03/28/2015   Procedure: RIGHT TOTAL KNEE ARTHROPLASTY;  Surgeon: [name], MD;  Location: [facility];  Service: Orthopedics;  Laterality: Right;   Social  History   Occupational History  . Landscaping & rental handyman    Social History Main Topics  . Smoking status: Current Some Day Smoker    Packs/day: 0.25    Years: 38.00    Types: Cigarettes  . Smokeless tobacco: Never Used     Comment: 2 cigs weekly  . Alcohol use 3.0 - 3.6 oz/week    5 - 6 Cans of beer per week     Comment: 2 -3days week goes to bar to have "a couple of beers so he can sleep"  . Drug use: Yes    Types: Marijuana     Comment: last week  . Sexual activity: Not on file

## 2016-06-27 ENCOUNTER — Ambulatory Visit (HOSPITAL_COMMUNITY)
Admission: RE | Admit: 2016-06-27 | Discharge: 2016-06-27 | Disposition: A | Discharge status: Home / Self Care | Payer: Self-pay | Source: Ambulatory Visit | Attending: Family Medicine | Admitting: Family Medicine

## 2016-06-27 DIAGNOSIS — S93491D Sprain of other ligament of right ankle, subsequent encounter: Secondary | ICD-10-CM

## 2016-06-27 DIAGNOSIS — M19071 Primary osteoarthritis, right ankle and foot: Secondary | ICD-10-CM | POA: Insufficient documentation

## 2016-06-27 DIAGNOSIS — M25471 Effusion, right ankle: Secondary | ICD-10-CM | POA: Insufficient documentation

## 2016-06-29 MED FILL — LOSARTAN POTASSIUM 50 MG TA: 50 | 30 days supply | Qty: 30 | Fill #1

## 2016-07-01 ENCOUNTER — Telehealth (INDEPENDENT_AMBULATORY_CARE_PROVIDER_SITE_OTHER): Payer: Self-pay | Admitting: Orthopaedic Surgery

## 2016-07-01 NOTE — Telephone Encounter (Signed)
Patient called wanting to ask a few questions about his MRI that he had Saturday. CB # [phone]

## 2016-07-01 NOTE — Telephone Encounter (Signed)
Patient was returning your call. CB # [phone]

## 2016-07-01 NOTE — Telephone Encounter (Signed)
Called patient back and he just wanted to get the results from the MRI on the ankle. He said he will just wait until the 10th to get both results for both MRI's

## 2016-07-01 NOTE — Telephone Encounter (Signed)
called patient no answer, LMOM

## 2016-07-02 IMAGING — CR DG CHEST 2V
3 series · 3 of 3 positions shown · non-contrast
Comparison: None.

CLINICAL DATA: Right lateral rib pain.

EXAM:
CHEST  2 VIEW

[w chest pa (1 of 2)]
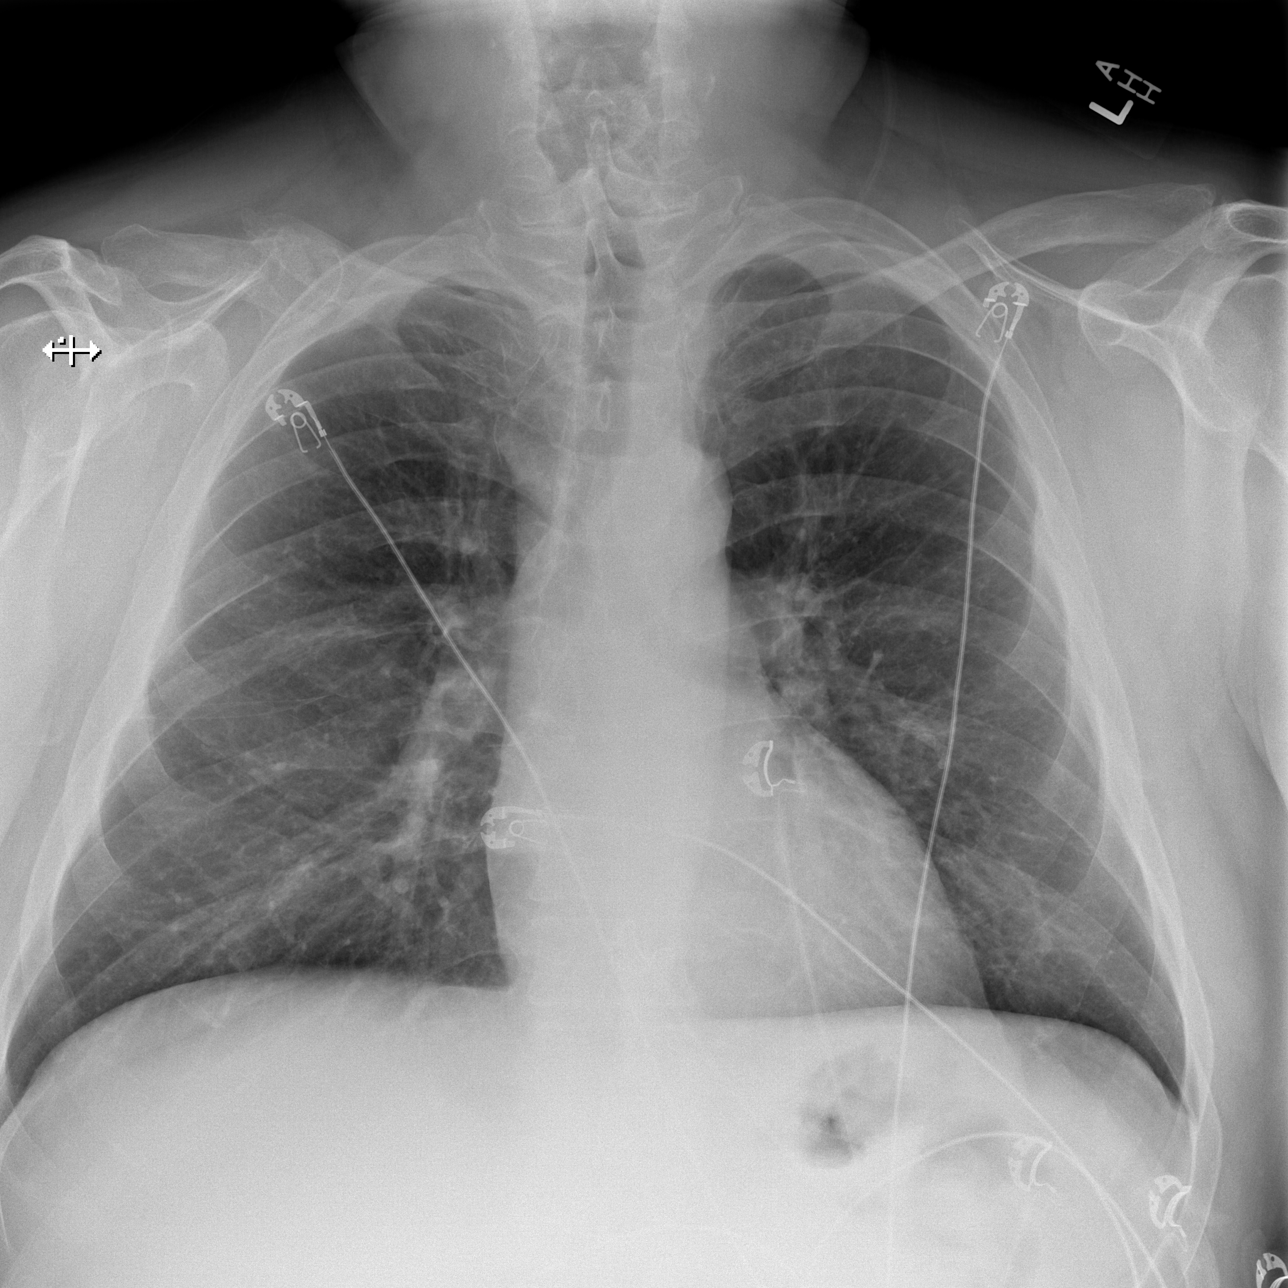

[w chest pa (2 of 2)]
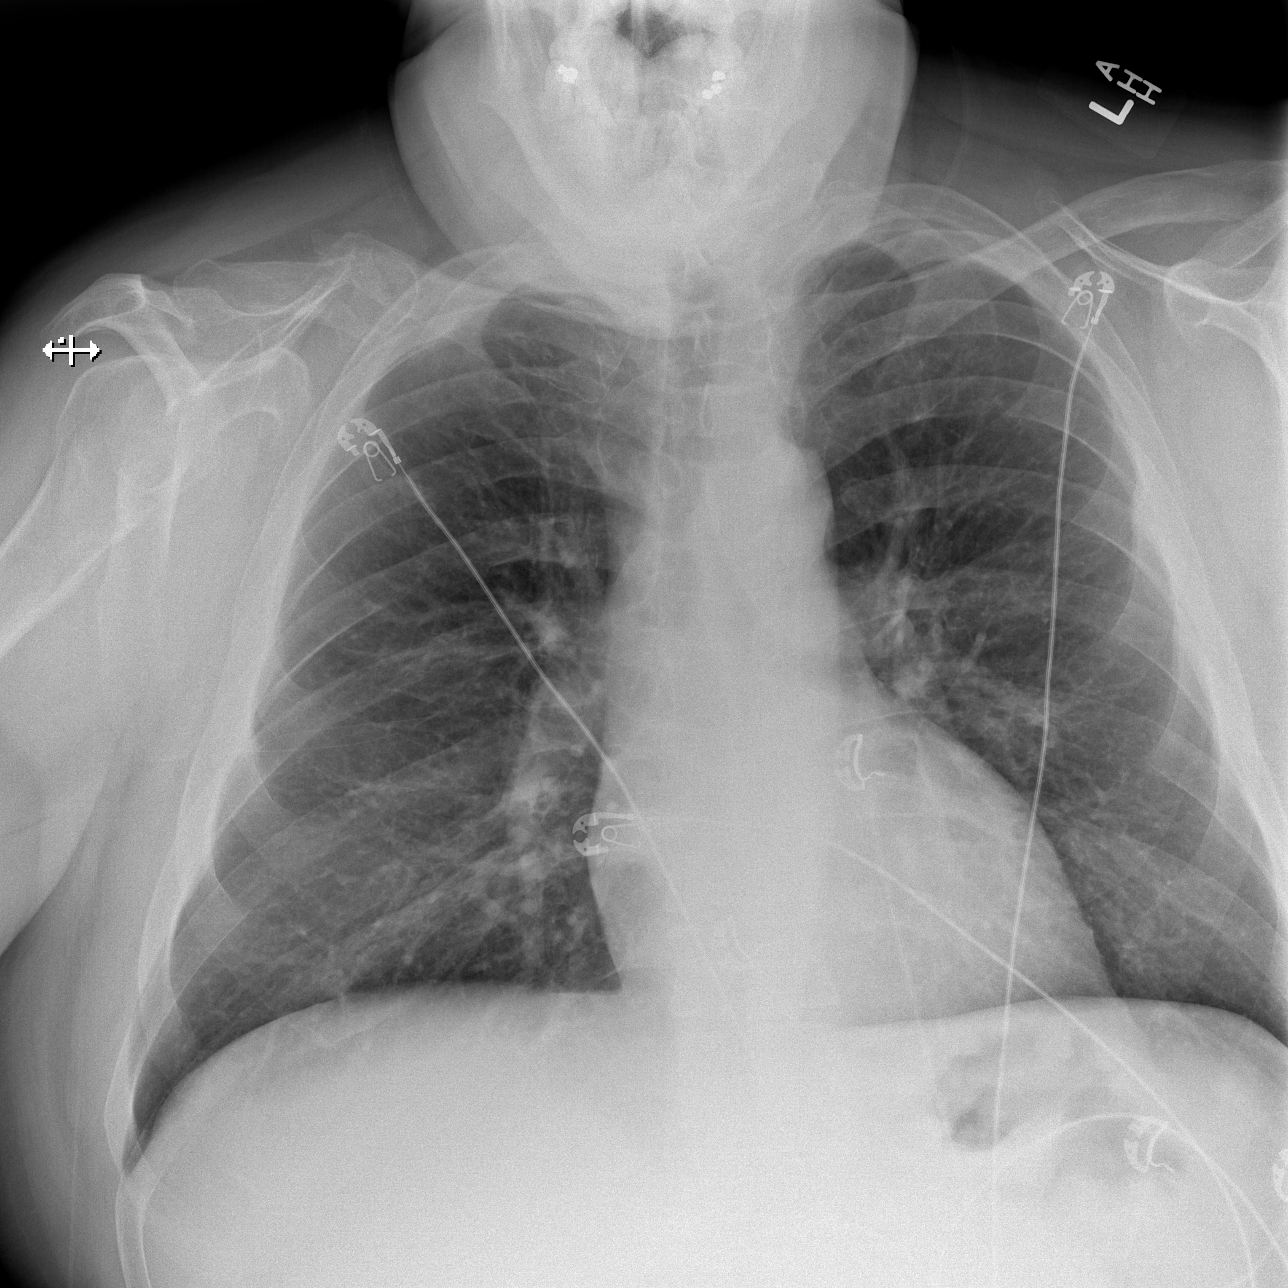

[w chest lat]
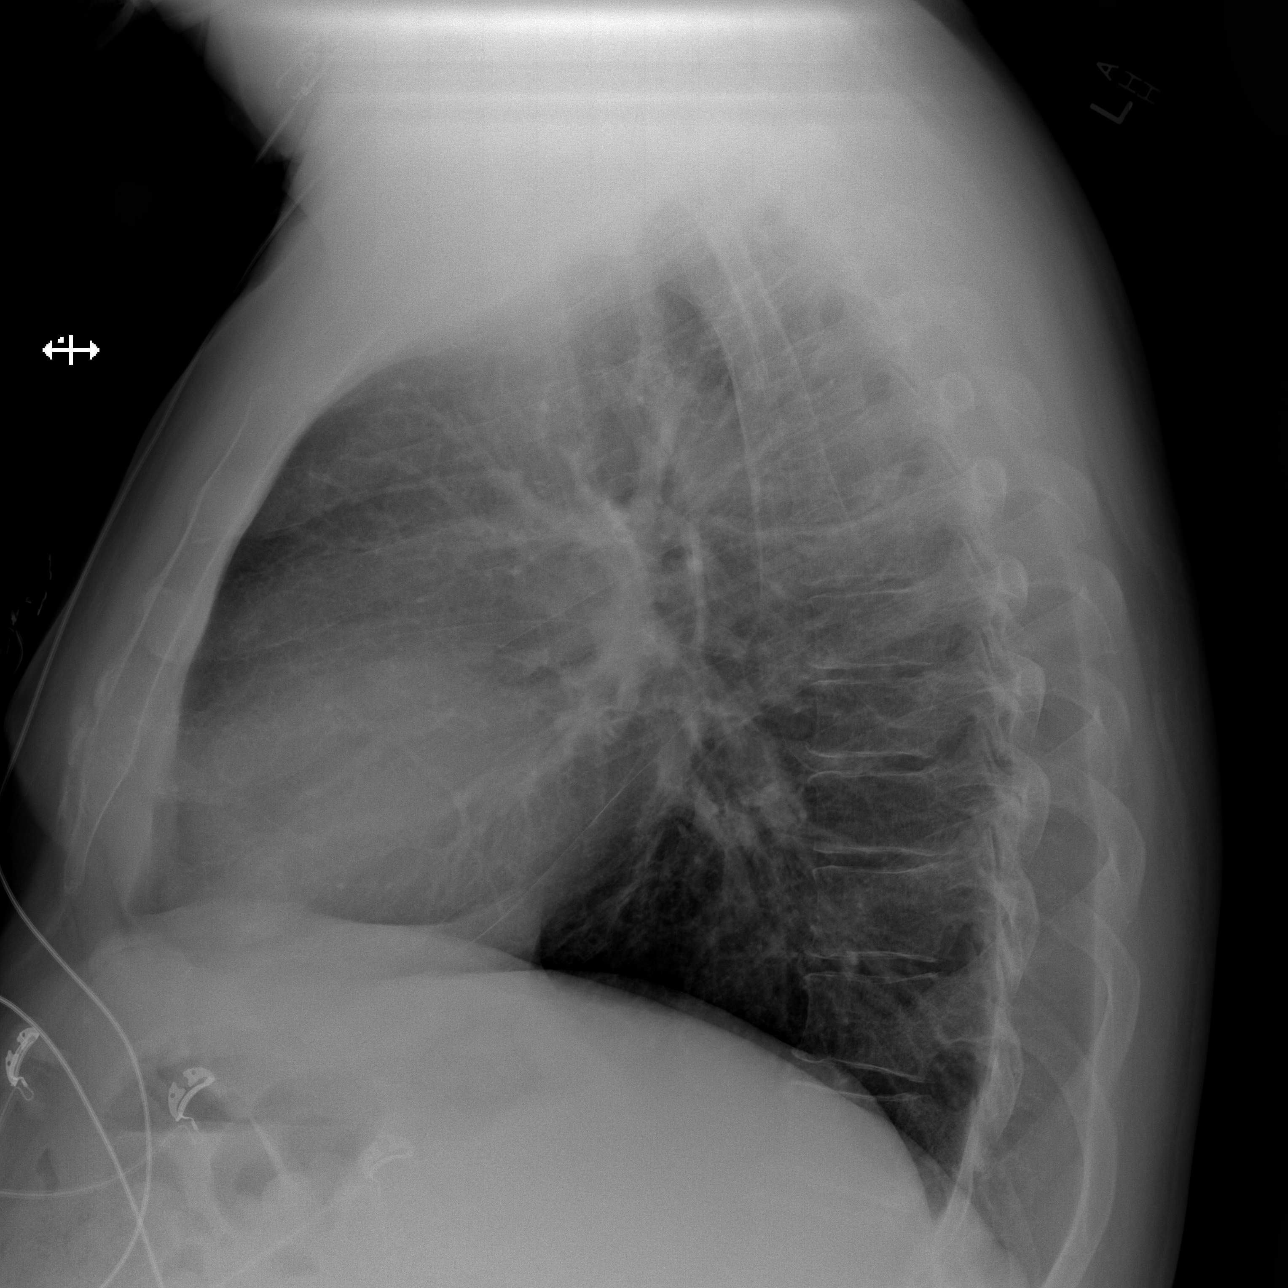

[3 of 3 positions shown; findings below may reference images not displayed]

FINDINGS: The heart size and mediastinal contours are within normal limits.
Both lungs are clear. The visualized skeletal structures are
unremarkable.
IMPRESSION: No active cardiopulmonary disease.

## 2016-07-03 ENCOUNTER — Encounter: Payer: Self-pay | Admitting: Family Medicine

## 2016-07-03 ENCOUNTER — Telehealth: Payer: Self-pay

## 2016-07-03 DIAGNOSIS — M14671 Charcot's joint, right ankle and foot: Secondary | ICD-10-CM

## 2016-07-03 NOTE — Telephone Encounter (Signed)
Writer called patient with MRI results of the foot per Dr. Adrian Blackwater.  LVM asking patient to return call to discuss.

## 2016-07-06 ENCOUNTER — Telehealth: Payer: Self-pay | Admitting: Family Medicine

## 2016-07-06 ENCOUNTER — Ambulatory Visit (HOSPITAL_COMMUNITY)
Admission: RE | Admit: 2016-07-06 | Discharge: 2016-07-06 | Disposition: A | Discharge status: Home / Self Care | Payer: Self-pay | Source: Ambulatory Visit | Attending: Orthopaedic Surgery | Admitting: Orthopaedic Surgery

## 2016-07-06 DIAGNOSIS — M25511 Pain in right shoulder: Secondary | ICD-10-CM

## 2016-07-06 DIAGNOSIS — M6250 Muscle wasting and atrophy, not elsewhere classified, unspecified site: Secondary | ICD-10-CM | POA: Insufficient documentation

## 2016-07-06 DIAGNOSIS — M75111 Incomplete rotator cuff tear or rupture of right shoulder, not specified as traumatic: Secondary | ICD-10-CM | POA: Insufficient documentation

## 2016-07-06 DIAGNOSIS — M19011 Primary osteoarthritis, right shoulder: Secondary | ICD-10-CM | POA: Insufficient documentation

## 2016-07-06 NOTE — Telephone Encounter (Signed)
Writer called patient back and was able to share the results of his MRI.  Patient states he has an appt with [facility] tomorrow and he will have the ankle issue addressed as well as his right shoulder problem.  Patient stated understanding of the results.

## 2016-07-06 NOTE — Telephone Encounter (Signed)
Pt returned call from [facility] regarding MRI results. Please f/u. Thank you

## 2016-07-07 ENCOUNTER — Encounter (INDEPENDENT_AMBULATORY_CARE_PROVIDER_SITE_OTHER): Payer: Self-pay | Admitting: Orthopaedic Surgery

## 2016-07-07 ENCOUNTER — Ambulatory Visit (INDEPENDENT_AMBULATORY_CARE_PROVIDER_SITE_OTHER): Payer: Self-pay | Admitting: Orthopaedic Surgery

## 2016-07-07 DIAGNOSIS — M7541 Impingement syndrome of right shoulder: Secondary | ICD-10-CM | POA: Insufficient documentation

## 2016-07-07 DIAGNOSIS — M79671 Pain in right foot: Secondary | ICD-10-CM

## 2016-07-07 MED ORDER — DICLOFENAC SODIUM 75 MG PO TBEC: 75.0000 mg | DELAYED_RELEASE_TABLET | Freq: Two times a day (BID) | ORAL | 2 refills | Status: DC

## 2016-07-07 NOTE — Progress Notes (Signed)
Office Visit Note   Patient: Jeffery [name]           Date of Birth: 01-22-1961           MRN: [id number] Visit Date: 07/07/2016              Requested by: Arnoldo Morale, MD [place], [place] [address] PCP: Arnoldo Morale, MD   Assessment & Plan: Visit Diagnoses:  1. Impingement syndrome of right shoulder   2. Right foot pain     Plan: MRI of the right shoulder shows significant cortical clavicular joint arthropathy likely impingement syndrome. He has a high-grade partial-thickness tear of the supraspinatus tendon. His shoulder seems to be well compensated. He does have some pain with certain movements. His right foot is bothering him more right now. I reviewed the MRI which does show severe degenerative changes at the midfoot on the medial side. He did have a hemoglobin A1c of greater than 10 about a month ago. I will like to refer him to Dr. Sharol Given for further evaluation and treatment.  Follow-Up Instructions: Return if symptoms worsen or fail to improve.   Orders:  No orders of the defined types were placed in this encounter.  Meds ordered this encounter  Medications  . diclofenac (VOLTAREN) 75 MG EC tablet    Sig: Take 1 tablet (75 mg total) by mouth 2 (two) times daily.    Dispense:  30 tablet    Refill:  2      Procedures: No procedures performed   Clinical Data: No additional findings.   Subjective: Chief Complaint  Patient presents with  . Right Shoulder - Follow-up, Pain  . Right Ankle - Follow-up, Pain    Patient follows up today for his right shoulder and right ankle MRI. His right foot is hurting him a lot. His shoulder mainly has pain when he is lifting something away from his body. His right foot is hurting all the time    Review of Systems  Constitutional: Negative.   All other systems reviewed and are negative.    Objective: Vital Signs: There were no vitals taken for this visit.  Physical Exam  Constitutional: He is  oriented to person, place, and time. He appears well-developed and well-nourished.  Pulmonary/Chest: Effort normal.  Abdominal: Soft.  Neurological: He is alert and oriented to person, place, and time.  Skin: Skin is warm.  Psychiatric: He has a normal mood and affect. His behavior is normal. Judgment and thought content normal.  Nursing note and vitals reviewed.   Ortho Exam Right shoulder exam shows 4-5 and he can testing with positive impingement signs Right foot exam shows significant pitting edema with strong pulses. He does have dry skin. He is a superficial blister on his third toe. Specialty Comments:  No specialty comments available.  Imaging: Mr Shoulder Right Wo Contrast  Result Date: 07/06/2016 CLINICAL DATA:  Right shoulder pain when patient was arm above head. Patient notes an injury 5 years ago falling on his right side. EXAM: MRI OF THE RIGHT SHOULDER WITHOUT CONTRAST TECHNIQUE: Multiplanar, multisequence MR imaging of the shoulder was performed. No intravenous contrast was administered. COMPARISON:  None. FINDINGS: Rotator cuff: Delaminating articular surface tear of the supraspinatus tendon spanning a length of 2.1 cm on the coronal oblique images and 1.5 cm in AP dimension. No significant subacromial nor subdeltoid bursal fluid to suggest a full thickness component. Minimal fraying of the superior fibers of the subscapularis tendon. Intact  infraspinatus and teres minor. Muscles: Isolated fatty atrophy of teres minor muscle, series 7 image 18 for example. Biceps long head:  Intact. Acromioclavicular Joint: Moderate to severe arthropathy of the acromioclavicular joint. Type II acromion. No subacromial/subdeltoid bursal fluid. Glenohumeral Joint: No focal chondral defect. Labrum: Grossly intact, but evaluation is limited by lack of intraarticular fluid. Bones: No significant marrow signal abnormality, fracture nor dislocations. Other: None. IMPRESSION: 1. Muscle atrophy of the teres  minor. Findings could be secondary to a remote injury to the posterior axillary nerve or quadrilateral space syndrome. 2. Delaminating articular surface tear of the supraspinatus tendon spanning 2.1 cm coronal oblique by 1.5 cm AP. Given lack of subacromial subdeltoid bursal fluid this is likely a high-grade partial tear as opposed to a complete full-thickness tear with retraction. 3. Moderate-to-marked AC joint osteoarthritis with impression on the myotendinous junction of the supraspinatus. This likely contributes to impingement type symptoms with shoulder abduction. Electronically Signed   By: Ashley Royalty M.D.   On: 07/06/2016 23:10     PMFS History: Patient Active Problem List   Diagnosis Date Noted  . Impingement syndrome of right shoulder 07/07/2016  . Right foot pain 07/07/2016  . Charcot's joint of right foot 07/03/2016  . Morbid obesity ([place]) 06/05/2016  . S/P total knee replacement using cement 03/28/2015  . Knee osteoarthritis 11/12/2014  . Hypertension 10/15/2014  . Cellulitis of left lower extremity   . Osteomyelitis of toe of left foot ([place])   . Toe osteomyelitis, left ([place]) 10/01/2014  . Cellulitis of leg, left 10/01/2014  . Uncontrolled diabetes mellitus with complications ([place]) 63/14/9702  . Accelerated hypertension 10/01/2014  . Gangrene left third toe 10/01/2014  . SHOULDER JOINT INSTABILITY 05/27/2010  . SHOULDER PAIN, RIGHT 05/27/2010   Past Medical History:  Diagnosis Date  . DDD (degenerative disc disease), lumbar   . Diabetes mellitus without complication ([facility])   . Hypertension   . Rotator cuff disorder     Family History  Problem Relation Age of Onset  . Diabetes Father   . Hypertension Father   . Heart failure Father     Past Surgical History:  Procedure Laterality Date  . AMPUTATION Left 10/02/2014   Procedure: Left Third toe amputation ;  Surgeon: [name], MD;  Location: [facility];  Service: Orthopedics;  Laterality: Left;  Regular bed, wants to  follow hip  . ANTERIOR CRUCIATE LIGAMENT REPAIR Right 90   reconstruction  . APPLICATION OF WOUND VAC Left 10/02/2014   Procedure: APPLICATION OF WOUND VAC; toe Surgeon: [name], MD;  Location: [facility];  Service: Orthopedics;  Laterality: Left;  . I&D EXTREMITY Left 10/05/2014   Procedure: IRRIGATION AND DEBRIDEMENT LEFT FOOT;  Surgeon: [name], MD;  Location: [facility];  Service: Orthopedics;  Laterality: Left;  . KNEE ARTHROSCOPY W/ ACL RECONSTRUCTION Right   . TOTAL KNEE ARTHROPLASTY Right 03/28/2015  . TOTAL KNEE ARTHROPLASTY Right 03/28/2015   Procedure: RIGHT TOTAL KNEE ARTHROPLASTY;  Surgeon: [name], MD;  Location: [facility];  Service: Orthopedics;  Laterality: Right;   Social History   Occupational History  . Landscaping & rental handyman    Social History Main Topics  . Smoking status: Current Some Day Smoker    Packs/day: 0.25    Years: 38.00    Types: Cigarettes  . Smokeless tobacco: Never Used     Comment: 2 cigs weekly  . Alcohol use 3.0 - 3.6 oz/week    5 - 6 Cans of  beer per week     Comment: 2 -3days week goes to bar to have "a couple of beers so he can sleep"  . Drug use: Yes    Types: Marijuana     Comment: last week  . Sexual activity: Not on file

## 2016-07-09 ENCOUNTER — Encounter (INDEPENDENT_AMBULATORY_CARE_PROVIDER_SITE_OTHER): Payer: Self-pay | Admitting: Orthopedic Surgery

## 2016-07-09 ENCOUNTER — Ambulatory Visit (INDEPENDENT_AMBULATORY_CARE_PROVIDER_SITE_OTHER): Payer: Self-pay | Admitting: Orthopedic Surgery

## 2016-07-09 VITALS — Ht 75.0 in | Wt 304.0 lb

## 2016-07-09 DIAGNOSIS — E1142 Type 2 diabetes mellitus with diabetic polyneuropathy: Secondary | ICD-10-CM

## 2016-07-09 DIAGNOSIS — IMO0002 Reserved for concepts with insufficient information to code with codable children: Secondary | ICD-10-CM | POA: Insufficient documentation

## 2016-07-09 DIAGNOSIS — E11610 Type 2 diabetes mellitus with diabetic neuropathic arthropathy: Secondary | ICD-10-CM

## 2016-07-09 DIAGNOSIS — L97919 Non-pressure chronic ulcer of unspecified part of right lower leg with unspecified severity: Secondary | ICD-10-CM

## 2016-07-09 DIAGNOSIS — M25571 Pain in right ankle and joints of right foot: Secondary | ICD-10-CM

## 2016-07-09 DIAGNOSIS — I87321 Chronic venous hypertension (idiopathic) with inflammation of right lower extremity: Secondary | ICD-10-CM | POA: Insufficient documentation

## 2016-07-09 DIAGNOSIS — I87331 Chronic venous hypertension (idiopathic) with ulcer and inflammation of right lower extremity: Secondary | ICD-10-CM

## 2016-07-09 NOTE — Progress Notes (Signed)
Office Visit Note   Patient: Jeffery [name]           Date of Birth: June 30, 1960           MRN: [id number] Visit Date: 07/09/2016              Requested by: Arnoldo Morale, MD [place], [place] [address] PCP: Arnoldo Morale, MD  Chief Complaint  Patient presents with  . Right Foot - Pain    eval Charcot foot      HPI: Patient is a 56 year old gentleman diabetic insensate neuropathy hemoglobin A1c 10% smoker who presents complaining of dermatitis venous insufficiency swelling right leg with a fusion the right ankle and pain to the right midfoot. Patient has had an MRI scan which shows destructive changes consistent with Charcot arthropathy as well as an ankle effusion.  Assessment & Plan: Visit Diagnoses:  1. Pain in right ankle and joints of right foot   2. Diabetic polyneuropathy associated with type 2 diabetes mellitus ([facility])   3. Charcot foot due to diabetes mellitus ([place])     Plan: Will have patient go to [facility] supply for a 20-30 mm compression stocking. We will draw a uric acid to rule out gout of the right ankle. We'll have him continue with the fracture boot until follow-up.  Repeat 3 view radiographs of the right foot at follow-up.  Follow-Up Instructions: Return in about 3 weeks (around 07/30/2016).   Ortho Exam  Patient is alert, oriented, no adenopathy, well-dressed, normal affect, normal respiratory effort. Examination patient has an antalgic gait. He states his foot feels much better with using the fracture boot than without it. He has dermatitis with venous ulcers involving the foot and leg with pitting edema up to the tibial tubercle is no weeping edema there is no cellulitis. Patient has a palpable dorsalis pedis pulse he does not have protective sensation patient has swelling to the midfoot and swelling around the ankle. The midfoot and ankle are tender to palpation. Review the MRI scan does show bony edema changes consistent with  Charcot arthropathy throughout the foot. There is also an effusion of the ankle.  aging: No results found.  Labs: Lab Results  Component Value Date   HGBA1C 10.3 06/05/2016   HGBA1C 11.1 05/14/2016   HGBA1C 11.4 10/28/2015   ESRSEDRATE 1 03/18/2015   CRP 0.6 03/18/2015   LABURIC 3.5 (L) 05/14/2016   REPTSTATUS 10/06/2014 FINAL 10/01/2014   GRAMSTAIN  10/01/2014    ABUNDANT WBC PRESENT, PREDOMINANTLY PMN ABUNDANT GRAM POSITIVE COCCI IN CLUSTERS FEW SQUAMOUS EPITHELIAL CELLS PRESENT Gram Stain Report Called to,Read Back By and Verified With: Vanessa Barbara [id number] @ [address]  10/01/2014    RARE WBC PRESENT, PREDOMINANTLY PMN NO SQUAMOUS EPITHELIAL CELLS SEEN ABUNDANT GRAM POSITIVE COCCI IN PAIRS IN CLUSTERS Performed at [facility]  10/01/2014    NO GROWTH 5 DAYS Performed at [facility] 10/01/2014    Orders:  Orders Placed This Encounter  Procedures  . Uric acid   No orders of the defined types were placed in this encounter.    Procedures: No procedures performed  Clinical Data: No additional findings.  ROS:  All other systems negative, except as noted in the HPI. Review of Systems  Objective: Vital Signs: Ht 6\' 3"  (1.905 m)   Wt (!) 304 lb (137.9 kg)   BMI 38.00 kg/m  Specialty Comments:  No specialty comments available.  PMFS History: Patient Active Problem List   Diagnosis Date Noted  . Pain in right ankle and joints of right foot 07/09/2016  . Diabetic polyneuropathy associated with type 2 diabetes mellitus ([place]) 07/09/2016  . Impingement syndrome of right shoulder 07/07/2016  . Right foot pain 07/07/2016  . Charcot's joint of right foot 07/03/2016  . Morbid obesity ([facility] [facility]) 06/05/2016  . S/P total knee replacement using cement 03/28/2015  . Knee osteoarthritis 11/12/2014  . Hypertension 10/15/2014  . Cellulitis of left lower extremity   . Osteomyelitis of toe  of left foot ([place])   . Toe osteomyelitis, left ([facility] [facility] [facility]) 10/01/2014  . Cellulitis of leg, left 10/01/2014  . Charcot foot due to diabetes mellitus ([place]) 10/01/2014  . Accelerated hypertension 10/01/2014  . Gangrene left third toe 10/01/2014  . SHOULDER JOINT INSTABILITY 05/27/2010  . SHOULDER PAIN, RIGHT 05/27/2010   Past Medical History:  Diagnosis Date  . DDD (degenerative disc disease), lumbar   . Diabetes mellitus without complication ([facility])   . Hypertension   . Rotator cuff disorder     Family History  Problem Relation Age of Onset  . Diabetes Father   . Hypertension Father   . Heart failure Father     Past Surgical History:  Procedure Laterality Date  . AMPUTATION Left 10/02/2014   Procedure: Left Third toe amputation ;  Surgeon: [name], MD;  Location: [facility];  Service: Orthopedics;  Laterality: Left;  Regular bed, wants to follow hip  . ANTERIOR CRUCIATE LIGAMENT REPAIR Right 90   reconstruction  . APPLICATION OF WOUND VAC Left 10/02/2014   Procedure: APPLICATION OF WOUND VAC; toe Surgeon: [name], MD;  Location: [facility];  Service: Orthopedics;  Laterality: Left;  . I&D EXTREMITY Left 10/05/2014   Procedure: IRRIGATION AND DEBRIDEMENT LEFT FOOT;  Surgeon: [name], MD;  Location: [facility];  Service: Orthopedics;  Laterality: Left;  . KNEE ARTHROSCOPY W/ ACL RECONSTRUCTION Right   . TOTAL KNEE ARTHROPLASTY Right 03/28/2015  . TOTAL KNEE ARTHROPLASTY Right 03/28/2015   Procedure: RIGHT TOTAL KNEE ARTHROPLASTY;  Surgeon: [name], MD;  Location: [facility];  Service: Orthopedics;  Laterality: Right;   Social History   Occupational History  . Landscaping & rental handyman    Social History Main Topics  . Smoking status: Current Some Day Smoker    Packs/day: 0.25    Years: 38.00    Types: Cigarettes  . Smokeless tobacco: Never Used     Comment: 2 cigs weekly  . Alcohol use 3.0 - 3.6 oz/week    5 - 6 Cans of beer per week     Comment: 2 -3days week goes to bar to have  "a couple of beers so he can sleep"  . Drug use: Yes    Types: Marijuana     Comment: last week  . Sexual activity: Not on file

## 2016-07-10 LAB — URIC ACID: Uric Acid, Serum: 4.8 mg/dL (ref 4.0–8.0)

## 2016-07-14 ENCOUNTER — Telehealth (INDEPENDENT_AMBULATORY_CARE_PROVIDER_SITE_OTHER): Payer: Self-pay | Admitting: Orthopedic Surgery

## 2016-07-14 MED FILL — $Humalog 100u/ml vial: 100 | 90 days supply | Qty: 30 | Fill #0

## 2016-07-14 NOTE — Telephone Encounter (Signed)
Pt requested a call back to know his lab results that were sent off for gout.  [phone]

## 2016-07-14 NOTE — Telephone Encounter (Signed)
I called and spoke with patient uric acid was 4.8 and within normal limits, he does not have gout.

## 2016-07-16 MED FILL — $LANTUS SOLOSTAR 100 UNITS/: 100 | 33 days supply | Qty: 15 | Fill #1

## 2016-07-24 ENCOUNTER — Telehealth (INDEPENDENT_AMBULATORY_CARE_PROVIDER_SITE_OTHER): Payer: Self-pay | Admitting: Orthopaedic Surgery

## 2016-07-24 MED ORDER — DICLOFENAC SODIUM 75 MG PO TBEC: 75.0000 mg | DELAYED_RELEASE_TABLET | Freq: Two times a day (BID) | ORAL | 0 refills | Status: DC

## 2016-07-24 NOTE — Telephone Encounter (Signed)
Sent to the pharm. Patient aware

## 2016-07-24 NOTE — Telephone Encounter (Signed)
Please advise 

## 2016-07-24 NOTE — Telephone Encounter (Signed)
Patient called asked if his Rx for (Diclofenac) 75 mg can be called into the wellness center. The number to contact patient is [phone]

## 2016-07-24 NOTE — Telephone Encounter (Signed)
Yes #30

## 2016-07-24 NOTE — Addendum Note (Signed)
Addended by: Precious Bard on: 07/24/2016 11:46 AM   Modules accepted: Orders

## 2016-07-27 MED FILL — ?DICLOFENAC SOD DR 75 MG TA: 75 | 15 days supply | Qty: 30 | Fill #0

## 2016-07-28 MED FILL — LOSARTAN POTASSIUM 50 MG TA: 50 | 30 days supply | Qty: 30 | Fill #2

## 2016-07-30 ENCOUNTER — Ambulatory Visit (INDEPENDENT_AMBULATORY_CARE_PROVIDER_SITE_OTHER): Payer: Self-pay | Admitting: Orthopedic Surgery

## 2016-07-30 ENCOUNTER — Ambulatory Visit (INDEPENDENT_AMBULATORY_CARE_PROVIDER_SITE_OTHER): Payer: Self-pay

## 2016-07-30 ENCOUNTER — Encounter (INDEPENDENT_AMBULATORY_CARE_PROVIDER_SITE_OTHER): Payer: Self-pay | Admitting: Orthopedic Surgery

## 2016-07-30 VITALS — Ht 75.0 in | Wt 304.0 lb

## 2016-07-30 DIAGNOSIS — M25571 Pain in right ankle and joints of right foot: Secondary | ICD-10-CM

## 2016-07-30 DIAGNOSIS — E11610 Type 2 diabetes mellitus with diabetic neuropathic arthropathy: Secondary | ICD-10-CM

## 2016-07-30 NOTE — Progress Notes (Addendum)
Office Visit Note   Patient: Jeffery [name]           Date of Birth: 03-04-1961           MRN: [id number] Visit Date: 07/30/2016              Requested by: Arnoldo Morale, MD [place], [place] [address] PCP: Arnoldo Morale, MD  Chief Complaint  Patient presents with  . Right Ankle - Pain    Charcot foot ankle      HPI: Patient presents in follow-up for Charcot changes of the talonavicular joint as well as pain across the ankle is currently in a fracture boot. Patient went online to obtain some compression stockings and these are less than 10 mm of compression. Patient requests narcotic pain medicine for his neuropathy pain. He states his hemoglobin A1c has been running greater than 10 but he is now using insulin.  Assessment & Plan: Visit Diagnoses:  1. Pain in right ankle and joints of right foot   2. Charcot foot due to diabetes mellitus ([place])     Plan: Recommended that he go to [facility] to obtain a 20-30 mm compression stocking. Discussed that I do not feel the narcotics would be beneficial for his neuropathy pain and recommended better glucose control to get his  hemoglobin A1c closer to 6. Recommend he continue with a fracture boot. Patient is having pain along the posterior tibial tendon at this time his MRI scan showed no tearing of the posterior tibial tendon.  We reviewed all of the MRI findings on his MRI scan of the foot and ankle and reviewed each and every finding patient states he understood.  Follow-Up Instructions: Return in about 3 weeks (around 08/20/2016).   Ortho Exam  Patient is alert, oriented, no adenopathy, well-dressed, normal affect, normal respiratory effort. Examination patient has no pain to palpation over the ankle at this time previous MRI scan did show effusion but he is asymptomatic at this time. His uric acid level was normal. He has no pain to palpation along the posterior tibial tendon but this is the  course of pain that he is having. He has no tenderness to palpation across the Charcot changes across the talonavicular joint there is no redness no cellulitis no open ulcers. He does have venous stasis swelling in the leg and has been applying some cortisone cream recommended against a cortisone cream recommended that he could use a moisturizing lotion. His foot is plantigrade there is no rocker-bottom deformity no evidence of any Charcot changes no ankle joint irregularity.  Imaging: Xr Ankle Complete Right  Result Date: 07/30/2016 Three-view radiographs of the left ankle shows a congruent mortise with no displacement no fractures. Lateral radiograph does show the Charcot changes through the talonavicular joint.   Labs: Lab Results  Component Value Date   HGBA1C 10.3 06/05/2016   HGBA1C 11.1 05/14/2016   HGBA1C 11.4 10/28/2015   ESRSEDRATE 1 03/18/2015   CRP 0.6 03/18/2015   LABURIC 4.8 07/09/2016   LABURIC 3.5 (L) 05/14/2016   REPTSTATUS 10/06/2014 FINAL 10/01/2014   GRAMSTAIN  10/01/2014    ABUNDANT WBC PRESENT, PREDOMINANTLY PMN ABUNDANT GRAM POSITIVE COCCI IN CLUSTERS FEW SQUAMOUS EPITHELIAL CELLS PRESENT Gram Stain Report Called to,Read Back By and Verified With: Vanessa Barbara [phone] @ [address]  10/01/2014    RARE WBC PRESENT, PREDOMINANTLY PMN NO SQUAMOUS EPITHELIAL CELLS SEEN ABUNDANT GRAM POSITIVE COCCI IN PAIRS  IN CLUSTERS Performed at [facility]  10/01/2014    NO GROWTH 5 DAYS Performed at [facility] 10/01/2014    Orders:  Orders Placed This Encounter  Procedures  . XR Ankle Complete Right   No orders of the defined types were placed in this encounter.    Procedures: No procedures performed  Clinical Data: No additional findings.  ROS:  All other systems negative, except as noted in the HPI. Review of Systems  Objective: Vital Signs: Ht 6\' 3"  (1.905 m)   Wt (!)  304 lb (137.9 kg)   BMI 38.00 kg/m   Specialty Comments:  No specialty comments available.  PMFS History: Patient Active Problem List   Diagnosis Date Noted  . Pain in right ankle and joints of right foot 07/09/2016  . Diabetic polyneuropathy associated with type 2 diabetes mellitus ([place]) 07/09/2016  . Chronic venous hypertension (idiopathic) with ulcer and inflammation of right lower extremity ([place]) 07/09/2016  . Impingement syndrome of right shoulder 07/07/2016  . Right foot pain 07/07/2016  . Charcot's joint of right foot 07/03/2016  . Morbid obesity ([place]) 06/05/2016  . S/P total knee replacement using cement 03/28/2015  . Knee osteoarthritis 11/12/2014  . Hypertension 10/15/2014  . Cellulitis of left lower extremity   . Osteomyelitis of toe of left foot ([place])   . Toe osteomyelitis, left ([place]) 10/01/2014  . Cellulitis of leg, left 10/01/2014  . Charcot foot due to diabetes mellitus ([place]) 10/01/2014  . Accelerated hypertension 10/01/2014  . Gangrene left third toe 10/01/2014  . SHOULDER JOINT INSTABILITY 05/27/2010  . SHOULDER PAIN, RIGHT 05/27/2010   Past Medical History:  Diagnosis Date  . DDD (degenerative disc disease), lumbar   . Diabetes mellitus without complication ([place])   . Hypertension   . Rotator cuff disorder     Family History  Problem Relation Age of Onset  . Diabetes Father   . Hypertension Father   . Heart failure Father     Past Surgical History:  Procedure Laterality Date  . AMPUTATION Left 10/02/2014   Procedure: Left Third toe amputation ;  Surgeon: [name], MD;  Location: [facility];  Service: Orthopedics;  Laterality: Left;  Regular bed, wants to follow hip  . ANTERIOR CRUCIATE LIGAMENT REPAIR Right 90   reconstruction  . APPLICATION OF WOUND VAC Left 10/02/2014   Procedure: APPLICATION OF WOUND VAC; toe Surgeon: [name], MD;  Location: [facility];  Service: Orthopedics;  Laterality: Left;  . I&D EXTREMITY Left 10/05/2014   Procedure: IRRIGATION  AND DEBRIDEMENT LEFT FOOT;  Surgeon: [name], MD;  Location: [facility];  Service: Orthopedics;  Laterality: Left;  . KNEE ARTHROSCOPY W/ ACL RECONSTRUCTION Right   . TOTAL KNEE ARTHROPLASTY Right 03/28/2015  . TOTAL KNEE ARTHROPLASTY Right 03/28/2015   Procedure: RIGHT TOTAL KNEE ARTHROPLASTY;  Surgeon: [name], MD;  Location: [facility];  Service: Orthopedics;  Laterality: Right;   Social History   Occupational History  . Landscaping & rental handyman    Social History Main Topics  . Smoking status: Current Some Day Smoker    Packs/day: 0.25    Years: 38.00    Types: Cigarettes  . Smokeless tobacco: Never Used     Comment: 2 cigs weekly  . Alcohol use 3.0 - 3.6 oz/week    5 - 6 Cans of beer per week     Comment: 2 -3days week goes to  bar to have "a couple of beers so he can sleep"  . Drug use: Yes    Types: Marijuana     Comment: last week  . Sexual activity: Not on file

## 2016-08-04 ENCOUNTER — Other Ambulatory Visit: Payer: Self-pay

## 2016-08-04 ENCOUNTER — Emergency Department (HOSPITAL_COMMUNITY)
Admission: EM | Admit: 2016-08-04 | Discharge: 2016-08-04 | Disposition: A | Discharge status: Home / Self Care | Payer: Self-pay | Attending: Emergency Medicine | Admitting: Emergency Medicine

## 2016-08-04 ENCOUNTER — Emergency Department (HOSPITAL_COMMUNITY): Payer: Self-pay

## 2016-08-04 ENCOUNTER — Encounter (HOSPITAL_COMMUNITY): Payer: Self-pay | Admitting: *Deleted

## 2016-08-04 ENCOUNTER — Telehealth: Payer: Self-pay | Admitting: *Deleted

## 2016-08-04 DIAGNOSIS — F17210 Nicotine dependence, cigarettes, uncomplicated: Secondary | ICD-10-CM | POA: Insufficient documentation

## 2016-08-04 DIAGNOSIS — I10 Essential (primary) hypertension: Secondary | ICD-10-CM | POA: Insufficient documentation

## 2016-08-04 DIAGNOSIS — E119 Type 2 diabetes mellitus without complications: Secondary | ICD-10-CM | POA: Insufficient documentation

## 2016-08-04 DIAGNOSIS — Z96651 Presence of right artificial knee joint: Secondary | ICD-10-CM | POA: Insufficient documentation

## 2016-08-04 DIAGNOSIS — I4891 Unspecified atrial fibrillation: Secondary | ICD-10-CM | POA: Insufficient documentation

## 2016-08-04 DIAGNOSIS — Z794 Long term (current) use of insulin: Secondary | ICD-10-CM | POA: Insufficient documentation

## 2016-08-04 DIAGNOSIS — Z7982 Long term (current) use of aspirin: Secondary | ICD-10-CM | POA: Insufficient documentation

## 2016-08-04 LAB — BASIC METABOLIC PANEL
Anion gap: 8 (ref 5–15)
BUN: 16 mg/dL (ref 6–20)
CO2: 22 mmol/L (ref 22–32)
Calcium: 9 mg/dL (ref 8.9–10.3)
Chloride: 105 mmol/L (ref 101–111)
Creatinine, Ser: 0.89 mg/dL (ref 0.61–1.24)
GFR calc Af Amer: >60 mL/min (ref >60)
GFR calc non Af Amer: >60 mL/min (ref >60)
Glucose, Bld: 219 mg/dL — ABNORMAL HIGH (ref 65–99)
Potassium: 4.5 mmol/L (ref 3.5–5.1)
Sodium: 135 mmol/L (ref 135–145)

## 2016-08-04 LAB — CBC
HCT: 41.8 % (ref 39.0–52.0)
Hemoglobin: 13.7 g/dL (ref 13.0–17.0)
MCH: 28.1 pg (ref 26.0–34.0)
MCHC: 32.8 g/dL (ref 30.0–36.0)
MCV: 85.8 fL (ref 78.0–100.0)
Platelets: 251 10*3/uL (ref 150–400)
RBC: 4.87 MIL/uL (ref 4.22–5.81)
RDW: 13.9 % (ref 11.5–15.5)
WBC: 11.4 10*3/uL — ABNORMAL HIGH (ref 4.0–10.5)

## 2016-08-04 LAB — PROTIME-INR
INR: 1.04
Prothrombin Time: 13.7 seconds (ref 11.4–15.2)

## 2016-08-04 LAB — I-STAT TROPONIN, ED: Troponin i, poc: 0.03 ng/mL (ref 0.00–0.08)

## 2016-08-04 LAB — MAGNESIUM: Magnesium: 2.1 mg/dL (ref 1.7–2.4)

## 2016-08-04 MED ORDER — MIDAZOLAM HCL 2 MG/2ML IJ SOLN
INTRAMUSCULAR | Status: AC
  Filled 2016-08-04: qty 6

## 2016-08-04 MED ORDER — METOPROLOL TARTRATE 5 MG/5ML IV SOLN
5.0000 mg | INTRAVENOUS | Status: DC | PRN
  Administered 2016-08-04: 5 mg via INTRAVENOUS
  Filled 2016-08-04: qty 5

## 2016-08-04 MED ORDER — APIXABAN 5 MG PO TABS: 5.0000 mg | ORAL_TABLET | Freq: Two times a day (BID) | ORAL | 0 refills | Status: DC

## 2016-08-04 MED ORDER — FENTANYL CITRATE (PF) 100 MCG/2ML IJ SOLN
INTRAMUSCULAR | Status: AC | PRN
  Administered 2016-08-04: 100 ug via INTRAVENOUS

## 2016-08-04 MED ORDER — FENTANYL CITRATE (PF) 100 MCG/2ML IJ SOLN
INTRAMUSCULAR | Status: AC
  Filled 2016-08-04: qty 2

## 2016-08-04 MED ORDER — METOPROLOL TARTRATE 50 MG PO TABS: 50.0000 mg | ORAL_TABLET | Freq: Two times a day (BID) | ORAL | 0 refills | Status: DC

## 2016-08-04 MED ORDER — APIXABAN 5 MG PO TABS
5.0000 mg | ORAL_TABLET | Freq: Two times a day (BID) | ORAL | Status: DC
  Administered 2016-08-04: 5 mg via ORAL
  Filled 2016-08-04 (×2): qty 1

## 2016-08-04 MED ORDER — LORAZEPAM 2 MG/ML IJ SOLN
INTRAMUSCULAR | Status: AC | PRN
  Administered 2016-08-04: 4 mg via INTRAVENOUS

## 2016-08-04 NOTE — Sedation Documentation (Signed)
Pt cardioverted at 100J. Pt tolerated well. Pt returned to [facility]

## 2016-08-04 NOTE — ED Notes (Signed)
ED Provider at bedside. 

## 2016-08-04 NOTE — Discharge Instructions (Signed)
Take eliquis as prescribe. Take lopressor as prescribed. Call the [facility] clinic for follow up asap. Do not drink alcohol. Return here if any return of symptoms especially chest pain, weakness, or shortness of breath.

## 2016-08-04 NOTE — ED Provider Notes (Signed)
[name] DEPT Provider Note   CSN: [id number] Arrival date & time: 08/04/16  1427     History   Chief Complaint Chief Complaint  Patient presents with  . Atrial Fibrillation    HPI Jeffery [name] is a 56 y.o. male.  HPI 56 year old man presents today complaining of palpitations, dyspnea, generalized weakness that began yesterday morning on awakening. He states that he was well on Sunday. He went out to the bar and had several beers Sunday night. Yesterday morning on awakening he felt generally weak. He denies any previous history of atrial fibrillation. He has been told he had some premature atrial contractions in the past. He has not been seen by cardiologist. Past Medical History:  Diagnosis Date  . DDD (degenerative disc disease), lumbar   . Diabetes mellitus without complication ([place])   . Hypertension   . Rotator cuff disorder     Patient Active Problem List   Diagnosis Date Noted  . Pain in right ankle and joints of right foot 07/09/2016  . Diabetic polyneuropathy associated with type 2 diabetes mellitus ([place]) 07/09/2016  . Chronic venous hypertension (idiopathic) with ulcer and inflammation of right lower extremity ([place]) 07/09/2016  . Impingement syndrome of right shoulder 07/07/2016  . Right foot pain 07/07/2016  . Charcot's joint of right foot 07/03/2016  . Morbid obesity ([place]) 06/05/2016  . S/P total knee replacement using cement 03/28/2015  . Knee osteoarthritis 11/12/2014  . Hypertension 10/15/2014  . Cellulitis of left lower extremity   . Osteomyelitis of toe of left foot ([place])   . Toe osteomyelitis, left ([place]) 10/01/2014  . Cellulitis of leg, left 10/01/2014  . Charcot foot due to diabetes mellitus ([place]) 10/01/2014  . Accelerated hypertension 10/01/2014  . Gangrene left third toe 10/01/2014  . SHOULDER JOINT INSTABILITY 05/27/2010  . SHOULDER PAIN, RIGHT 05/27/2010    Past Surgical History:  Procedure Laterality Date  . AMPUTATION Left  10/02/2014   Procedure: Left Third toe amputation ;  Surgeon: [name], MD;  Location: [facility];  Service: Orthopedics;  Laterality: Left;  Regular bed, wants to follow hip  . ANTERIOR CRUCIATE LIGAMENT REPAIR Right 90   reconstruction  . APPLICATION OF WOUND VAC Left 10/02/2014   Procedure: APPLICATION OF WOUND VAC; toe Surgeon: [name], MD;  Location: [facility];  Service: Orthopedics;  Laterality: Left;  . I&D EXTREMITY Left 10/05/2014   Procedure: IRRIGATION AND DEBRIDEMENT LEFT FOOT;  Surgeon: [name], MD;  Location: [facility];  Service: Orthopedics;  Laterality: Left;  . KNEE ARTHROSCOPY W/ ACL RECONSTRUCTION Right   . TOTAL KNEE ARTHROPLASTY Right 03/28/2015  . TOTAL KNEE ARTHROPLASTY Right 03/28/2015   Procedure: RIGHT TOTAL KNEE ARTHROPLASTY;  Surgeon: [name], MD;  Location: [facility];  Service: Orthopedics;  Laterality: Right;       Home Medications    Prior to Admission medications   Medication Sig Start Date End Date Taking? Authorizing Provider  aspirin EC 325 MG tablet Take 1 tablet (325 mg total) by mouth 2 (two) times daily. 03/28/15   [name], MD  Blood Glucose Monitoring Suppl (TRUE METRIX METER) DEVI 1 each by Does not apply route 3 (three) times daily before meals. 06/19/16   Arnoldo Morale, MD  cyclobenzaprine (FLEXERIL) 10 MG tablet Take 1 tablet (10 mg total) by mouth 3 (three) times daily as needed for muscle spasms. 10/28/15   Arnoldo Morale, MD  diclofenac (VOLTAREN) 75 MG EC tablet Take 1 tablet (75  mg total) by mouth 2 (two) times daily. 07/24/16   [name], MD  glucose blood (TRUE METRIX BLOOD GLUCOSE TEST) test strip Use 3 times daily before meals 06/19/16   Arnoldo Morale, MD  insulin aspart (NOVOLOG) 100 UNIT/ML injection Inject 6 Units into the skin 3 (three) times daily with meals. 06/05/16   Arnoldo Morale, MD  Insulin Glargine (LANTUS SOLOSTAR) 100 UNIT/ML Solostar Pen Inject 45 Units into the skin every morning. 06/05/16   Arnoldo Morale, MD  insulin  lispro (HUMALOG) 100 UNIT/ML injection Inject 0.06 mLs (6 Units total) into the skin 3 (three) times daily with meals. 04/24/16   Tresa Garter, MD  Insulin Pen Needle (B-D ULTRAFINE III SHORT PEN) 31G X 8 MM MISC 1 each by Does not apply route 3 (three) times daily. 11/12/14   Arnoldo Morale, MD  Insulin Syringe-Needle U-100 (TRUEPLUS INSULIN SYRINGE) 30G X 5/16" 0.5 ML MISC Use as directed 3 times daily 12/14/14   Arnoldo Morale, MD  losartan (COZAAR) 50 MG tablet 1 tablet daily 05/14/16   Freeman Caldron M, PA-C  meloxicam (MOBIC) 15 MG tablet Take 1 tablet (15 mg total) by mouth daily. 05/14/16   Argentina Donovan, PA-C  traMADol (ULTRAM) 50 MG tablet Take 1 tablet (50 mg total) by mouth every 12 (twelve) hours as needed. 06/19/16   Arnoldo Morale, MD  TRUEPLUS INSULIN SYRINGE 30G X 5/16" 0.5 ML MISC USE AS DIRECTED 3 TIMES DAILY 04/03/16   Tresa Garter, MD  TRUEPLUS LANCETS 28G MISC 1 each by Does not apply route 3 (three) times daily before meals. 06/19/16   Arnoldo Morale, MD    Family History Family History  Problem Relation Age of Onset  . Diabetes Father   . Hypertension Father   . Heart failure Father     Social History Social History  Substance Use Topics  . Smoking status: Current Some Day Smoker    Packs/day: 0.25    Years: 38.00    Types: Cigarettes  . Smokeless tobacco: Never Used     Comment: 2 cigs weekly  . Alcohol use 3.0 - 3.6 oz/week    5 - 6 Cans of beer per week     Comment: 2 -3days week goes to bar to have "a couple of beers so he can sleep"     Allergies   Patient has no known allergies.   Review of Systems Review of Systems  All other systems reviewed and are negative.    Physical Exam Updated Vital Signs BP 107/73   Pulse 92   Temp 98.3 F (36.8 C) (Oral)   Resp 11   SpO2 99%   Physical Exam  Constitutional: He is oriented to person, place, and time. He appears well-developed.  HENT:  Head: Normocephalic and atraumatic.  Right  Ear: External ear normal.  Left Ear: External ear normal.  Nose: Nose normal.  Eyes: EOM are normal.  Neck: No tracheal deviation present.  Cardiovascular: An irregularly irregular rhythm present. Tachycardia present.   Pulmonary/Chest: Effort normal.  Musculoskeletal: Normal range of motion.  Neurological: He is alert and oriented to person, place, and time.  Skin: Skin is warm and dry.  Psychiatric: He has a normal mood and affect. His behavior is normal.  Nursing note and vitals reviewed.    ED Treatments / Results  Labs (all labs ordered are listed, but only abnormal results are displayed) Labs Reviewed  BASIC METABOLIC PANEL  CBC  MAGNESIUM  PROTIME-INR  Randolm Idol, ED    EKG  EKG Interpretation  Date/Time:  Tuesday Aug 04 2016 14:30:48 EDT Ventricular Rate:  173 PR Interval:    QRS Duration: 102 QT Interval:  278 QTC Calculation: 472 R Axis:   38 Text Interpretation:  Atrial fibrillation with rapid V-rate Repolarization abnormality, prob rate related Confirmed by Rainie Crenshaw MD, Maryln Eastham [phone]) on 08/04/2016 3:04:06 PM       EKG Interpretation  Date/Time:  Tuesday Aug 04 2016 15:01:35 EDT Ventricular Rate:  91 PR Interval:    QRS Duration: 107 QT Interval:  350 QTC Calculation: 431 R Axis:   38 Text Interpretation:  Normal sinus rhythm Premature atrial complexes Confirmed by Caralynn Gelber MD, Andee Poles ([id number]) on 08/04/2016 3:38:15 PM       Radiology No results found.  Procedures .Sedation Date/Time: 08/04/2016 3:04 PM Performed by: Pattricia Boss Authorized by: Pattricia Boss   Consent:    Consent obtained:  Verbal   Consent given by:  Patient   Risks discussed:  Prolonged sedation necessitating reversal and prolonged hypoxia resulting in organ damage Indications:    Procedure performed:  Cardioversion   Procedure necessitating sedation performed by:  Physician performing sedation   Intended level of sedation:  Deep Pre-sedation assessment:    Time since  last food or drink:  Breakfast   ASA classification: class 2 - patient with mild systemic disease     Neck mobility: normal     Mouth opening:  3 or more finger widths   Thyromental distance:  4 finger widths   Mallampati score:  III - soft palate, base of uvula visible   Pre-sedation assessments completed and reviewed: airway patency, mental status and respiratory function     History of difficult intubation: no     Pre-sedation assessment completed:  08/04/2016 2:45 PM Immediate pre-procedure details:    Reassessment: Patient reassessed immediately prior to procedure     Reviewed: vital signs and NPO status     Verified: bag valve mask available   Procedure details (see MAR for exact dosages):    Sedation start time:  08/04/2016 2:46 PM   Preoxygenation:  Nasal cannula   Sedation:  Midazolam   Analgesia:  Fentanyl   Intra-procedure monitoring:  Blood pressure monitoring, continuous capnometry, frequent LOC assessments, cardiac monitor, continuous pulse oximetry and frequent vital sign checks   Intra-procedure events: none     Sedation end time:  08/04/2016 3:06 PM   Total sedation time (minutes):  23 Post-procedure details:    Post-sedation assessment completed:  08/04/2016 3:06 PM   Attendance: Constant attendance by certified staff until patient recovered     Estimated blood loss (see I/O flowsheets): no     Post-sedation assessments completed and reviewed: airway patency, cardiovascular function, mental status, nausea/vomiting and respiratory function     Specimens recovered:  None   Patient is stable for discharge or admission: yes     Patient tolerance:  Tolerated well, no immediate complications .Cardioversion Date/Time: 08/04/2016 3:07 PM Performed by: Pattricia Boss Authorized by: Pattricia Boss   Consent:    Consent obtained:  Written   Consent given by:  Patient   Risks discussed:  Pain   Alternatives discussed:  Rate-control medication Pre-procedure details:    Cardioversion  basis:  Emergent   Rhythm:  Atrial fibrillation   Electrode placement:  Anterior-lateral Attempt one:    Cardioversion mode:  Synchronous   Shock (Joules):  100   Shock outcome:  Conversion to normal sinus rhythm Post-procedure details:  Patient status:  Awake   Patient tolerance of procedure:  Tolerated well, no immediate complications   (including critical care time)  Medications Ordered in ED Medications  midazolam (VERSED) 2 MG/2ML injection (not administered)  fentaNYL (SUBLIMAZE) 100 MCG/2ML injection (not administered)  metoprolol (LOPRESSOR) injection 5 mg (5 mg Intravenous Given 08/04/16 1459)  apixaban (ELIQUIS) tablet 5 mg (not administered)  LORazepam (ATIVAN) injection (4 mg Intravenous Given 08/04/16 1450)  fentaNYL (SUBLIMAZE) injection (100 mcg Intravenous Given 08/04/16 1449)     Initial Impression / Assessment and Plan / ED Course  I have reviewed the triage vital signs and the nursing notes.  Pertinent labs & imaging results that were available during my care of the patient were reviewed by me and considered in my medical decision making (see chart for details).    CHA2DS2/VAS Stroke Risk Points      2 >= 2 Points: High Risk  1 - 1.99 Points: Medium Risk  0 Points: Low Risk    The patient's score has not changed in the past year.:  No Change         Details    Note: External data might be a factor in metrics not marked with    Points Metrics   This score determines the patient's risk of having a stroke if the  patient has atrial fibrillation.       0 Has Congestive Heart Failure:  No   0 Has Vascular Disease:  No   1 Has Hypertension:  Yes   0 Age:  82   1 Has Diabetes:  Yes   0 Had Stroke:  No Had TIA:  No Had thromboembolism:  No   0 Male:  No       Total 2   Patient with no symptoms post procedure.  Repeat ekg nsr RX-eliquis and lopressor f/u a fib clinic  Final Clinical Impressions(s) / ED Diagnoses   Final diagnoses:  Atrial  fibrillation with RVR (HCC)    New Prescriptions New Prescriptions   APIXABAN (ELIQUIS STARTER PACK) 5 MG TABS TABLET    Take 1 tablet (5 mg total) by mouth 2 (two) times daily.   METOPROLOL (LOPRESSOR) 50 MG TABLET    Take 1 tablet (50 mg total) by mouth 2 (two) times daily.     Pattricia Boss, MD 08/04/16 [phone]

## 2016-08-04 NOTE — Telephone Encounter (Signed)
Pt arrived to [facility], alert and oriented. He stated he wanted to come in and have vitals checked due to shortness of breath before going over to the emergency department. He describes for several nights, he has chest pressure, SOB, and has been diaphoretic. He states symptoms would alleviate throughout the day.  VS: 125/90 P:80-124 irregular SpO2- 98  Pt is very anxious and slightly diaphoretic.  heart rate irregular upon auscultation. EKG performed; A.fib with RVR, HR: 170's. EMS was called, pt was taken to ED. Dr. Jarold Song notified and  agreed to send patient to ED.  Pt remained alert and oriented until arrival of EMS for transport.

## 2016-08-04 NOTE — ED Triage Notes (Signed)
Per ems- pt reports SOB that has been ongoing for 3 days. Pt also reports intermitted chest tightness and dizziness with standing. Pt states that he has not taken his BP medications today as well due to thinking it was the cause of his dizziness. Pt went to the health and wellness clinic and was found to be in Afib RVR rate of 160-180.

## 2016-08-05 MED FILL — METOPROLOL TARTRATE 50 MG T: 50 | 30 days supply | Qty: 60 | Fill #0

## 2016-08-05 MED FILL — ELIQUIS 5 MG TABLET: 5 | 30 days supply | Qty: 60 | Fill #0

## 2016-08-11 ENCOUNTER — Ambulatory Visit (HOSPITAL_COMMUNITY)
Admission: RE | Admit: 2016-08-11 | Discharge: 2016-08-11 | Disposition: A | Discharge status: Home / Self Care | Payer: Self-pay | Source: Ambulatory Visit | Attending: Nurse Practitioner | Admitting: Nurse Practitioner

## 2016-08-11 ENCOUNTER — Encounter (HOSPITAL_COMMUNITY): Payer: Self-pay | Admitting: Nurse Practitioner

## 2016-08-11 VITALS — BP 120/76 | HR 76 | Ht 75.0 in | Wt 318.0 lb

## 2016-08-11 DIAGNOSIS — M5136 Other intervertebral disc degeneration, lumbar region: Secondary | ICD-10-CM | POA: Insufficient documentation

## 2016-08-11 DIAGNOSIS — Z89422 Acquired absence of other left toe(s): Secondary | ICD-10-CM | POA: Insufficient documentation

## 2016-08-11 DIAGNOSIS — E119 Type 2 diabetes mellitus without complications: Secondary | ICD-10-CM | POA: Insufficient documentation

## 2016-08-11 DIAGNOSIS — Z794 Long term (current) use of insulin: Secondary | ICD-10-CM | POA: Insufficient documentation

## 2016-08-11 DIAGNOSIS — I10 Essential (primary) hypertension: Secondary | ICD-10-CM | POA: Insufficient documentation

## 2016-08-11 DIAGNOSIS — I480 Paroxysmal atrial fibrillation: Secondary | ICD-10-CM

## 2016-08-11 DIAGNOSIS — Z87891 Personal history of nicotine dependence: Secondary | ICD-10-CM | POA: Insufficient documentation

## 2016-08-11 DIAGNOSIS — Z7901 Long term (current) use of anticoagulants: Secondary | ICD-10-CM | POA: Insufficient documentation

## 2016-08-11 DIAGNOSIS — Z96651 Presence of right artificial knee joint: Secondary | ICD-10-CM | POA: Insufficient documentation

## 2016-08-11 LAB — TSH: TSH: 3.348 u[IU]/mL (ref 0.350–4.500)

## 2016-08-11 NOTE — Progress Notes (Signed)
Primary Care Physician: Arnoldo Morale, MD Referring Physician:F/U ER   Jeffery [name] is a 56 y.o. male with a h/o morbid obesity,DM,HTN, that was seen in the  ER after he was drinking beer in a bar the night before. The next morning he awoke feeling weak. He presented in new onset AFIb and underwent successful cardioversion. No further afib. He was placed on lopressor and eliquis 5 mg bid for a chadsvasc score of 2 (htn, DM).  He admits to snoring, drinks alcohol nightly. Some caffeine but not excessive amounts. He has RT charcot foot  and is not able to exercise.   Today, he denies symptoms of palpitations, chest pain, shortness of breath, orthopnea, PND, lower extremity edema, dizziness, presyncope, syncope, or neurologic sequela. The patient is tolerating medications without difficulties and is otherwise without complaint today.   Past Medical History:  Diagnosis Date  . DDD (degenerative disc disease), lumbar   . Diabetes mellitus without complication ([place])   . Hypertension   . Rotator cuff disorder    Past Surgical History:  Procedure Laterality Date  . AMPUTATION Left 10/02/2014   Procedure: Left Third toe amputation ;  Surgeon: [name], MD;  Location: [facility];  Service: Orthopedics;  Laterality: Left;  Regular bed, wants to follow hip  . ANTERIOR CRUCIATE LIGAMENT REPAIR Right 90   reconstruction  . APPLICATION OF WOUND VAC Left 10/02/2014   Procedure: APPLICATION OF WOUND VAC; toe Surgeon: [name], MD;  Location: [facility];  Service: Orthopedics;  Laterality: Left;  . I&D EXTREMITY Left 10/05/2014   Procedure: IRRIGATION AND DEBRIDEMENT LEFT FOOT;  Surgeon: [name], MD;  Location: [facility];  Service: Orthopedics;  Laterality: Left;  . KNEE ARTHROSCOPY W/ ACL RECONSTRUCTION Right   . TOTAL KNEE ARTHROPLASTY Right 03/28/2015  . TOTAL KNEE ARTHROPLASTY Right 03/28/2015   Procedure: RIGHT TOTAL KNEE ARTHROPLASTY;  Surgeon: [name], MD;  Location: [facility];  Service:  Orthopedics;  Laterality: Right;    Current Outpatient Prescriptions  Medication Sig Dispense Refill  . apixaban (ELIQUIS STARTER PACK) 5 MG TABS tablet Take 1 tablet (5 mg total) by mouth 2 (two) times daily. 60 tablet 0  . Blood Glucose Monitoring Suppl (TRUE METRIX METER) DEVI 1 each by Does not apply route 3 (three) times daily before meals. 1 Device 0  . diclofenac (VOLTAREN) 75 MG EC tablet Take 1 tablet (75 mg total) by mouth 2 (two) times daily. 30 tablet 0  . glucose blood (TRUE METRIX BLOOD GLUCOSE TEST) test strip Use 3 times daily before meals 100 each 12  . insulin aspart (NOVOLOG) 100 UNIT/ML injection Inject 6 Units into the skin 3 (three) times daily with meals. 30 mL 3  . Insulin Glargine (LANTUS SOLOSTAR) 100 UNIT/ML Solostar Pen Inject 45 Units into the skin every morning. 5 pen 3  . insulin lispro (HUMALOG) 100 UNIT/ML injection Inject 0.06 mLs (6 Units total) into the skin 3 (three) times daily with meals. 30 mL 3  . Insulin Pen Needle (B-D ULTRAFINE III SHORT PEN) 31G X 8 MM MISC 1 each by Does not apply route 3 (three) times daily. 100 each 5  . Insulin Syringe-Needle U-100 (TRUEPLUS INSULIN SYRINGE) 30G X 5/16" 0.5 ML MISC Use as directed 3 times daily 100 each 5  . losartan (COZAAR) 50 MG tablet 1 tablet daily 30 tablet 3  . metoprolol (LOPRESSOR) 50 MG tablet Take 1 tablet (50 mg total) by mouth 2 (two) times  daily. 60 tablet 0  . TRUEPLUS INSULIN SYRINGE 30G X 5/16" 0.5 ML MISC USE AS DIRECTED 3 TIMES DAILY 100 each 0  . TRUEPLUS LANCETS 28G MISC 1 each by Does not apply route 3 (three) times daily before meals. 100 each 12  . traMADol (ULTRAM) 50 MG tablet Take 1 tablet (50 mg total) by mouth every 12 (twelve) hours as needed. (Patient not taking: Reported on 08/11/2016) 40 tablet 0   No current facility-administered medications for this encounter.     No Known Allergies  Social History   Social History  . Marital status: Single    Spouse name: N/A  . Number  of children: N/A  . Years of education: N/A   Occupational History  . Landscaping & rental handyman    Social History Main Topics  . Smoking status: Former Smoker    Packs/day: 0.25    Years: 38.00    Types: Cigarettes  . Smokeless tobacco: Never Used     Comment: 2 cigs weekly  . Alcohol use 3.0 - 3.6 oz/week    5 - 6 Cans of beer per week     Comment: 2 -3days week goes to bar to have "a couple of beers so he can sleep"  . Drug use: Yes    Types: Marijuana     Comment: last week  . Sexual activity: Not on file   Other Topics Concern  . Not on file   Social History Narrative   Lives alone.    Family History  Problem Relation Age of Onset  . Diabetes Father   . Hypertension Father   . Heart failure Father     ROS- All systems are reviewed and negative except as per the HPI above  Physical Exam: Vitals:   08/11/16 1431  BP: 120/76  Pulse: 76  Weight: (!) 318 lb (144.2 kg)  Height: 6\' 3"  (1.905 m)   Wt Readings from Last 3 Encounters:  08/11/16 (!) 318 lb (144.2 kg)  07/30/16 (!) 304 lb (137.9 kg)  07/09/16 (!) 304 lb (137.9 kg)    Labs: Lab Results  Component Value Date   NA 135 08/04/2016   K 4.5 08/04/2016   CL 105 08/04/2016   CO2 22 08/04/2016   GLUCOSE 219 (H) 08/04/2016   BUN 16 08/04/2016   CREATININE 0.89 08/04/2016   CALCIUM 9.0 08/04/2016   MG 2.1 08/04/2016   Lab Results  Component Value Date   INR 1.04 08/04/2016   Lab Results  Component Value Date   CHOL  04/10/2010    192        ATP III CLASSIFICATION:  <200     mg/dL   Desirable  200-239  mg/dL   Borderline High  >=240    mg/dL   High          HDL 50 04/10/2010   LDLCALC (H) 04/10/2010    110        Total Cholesterol/HDL:CHD Risk Coronary Heart Disease Risk Table                     Men   Women  1/2 Average Risk   3.4   3.3  Average Risk       5.0   4.4  2 X Average Risk   9.6   7.1  3 X Average Risk  23.4   11.0        Use the calculated Patient Ratio above and  the  CHD Risk Table to determine the patient's CHD Risk.        ATP III CLASSIFICATION (LDL):  <100     mg/dL   Optimal  100-129  mg/dL   Near or Above                    Optimal  130-159  mg/dL   Borderline  160-189  mg/dL   High  >190     mg/dL   Very High   TRIG 161 (H) 04/10/2010     GEN- The patient is well appearing, alert and oriented x 3 today.   Head- normocephalic, atraumatic Eyes-  Sclera clear, conjunctiva pink Ears- hearing intact Oropharynx- clear Neck- supple, no JVP Lymph- no cervical lymphadenopathy Lungs- Clear to ausculation bilaterally, normal work of breathing Heart- Regular rate and rhythm, no murmurs, rubs or gallops, PMI not laterally displaced GI- soft, NT, ND, + BS Extremities- no clubbing, cyanosis, or edema MS- no significant deformity or atrophy Skin- no rash or lesion Psych- euthymic mood, full affect Neuro- strength and sensation are intact  EKG- NSR at 76 bpm, pr int 168 ms, qrs int 102 ms, qtc 411 ms Epic records reviewed    Assessment and Plan: 1. Paroxysmal afib, new onset General education re afib Continue eliquis 5 mg bid for chadsvasc score of 2 General bleeding precautions discussed Continue lopressor Echo tsh    2. Lifestyle issues Encouraged whatever exercise pt could do without aggravating foot problems Limit alcohol and caffeine Snoring history and sleep study ordered Weight loss encouraged  f/u in 3 weeks  Butch Penny C. Keyairra Kolinski, [facility] [address] [place], [place] [address] [phone]

## 2016-08-14 MED FILL — !LANTUS SOLOSTAR 100UNITS/M: 100 | 19 days supply | Qty: 9 | Fill #2

## 2016-08-18 ENCOUNTER — Ambulatory Visit (HOSPITAL_COMMUNITY)
Admission: RE | Admit: 2016-08-18 | Discharge: 2016-08-18 | Disposition: A | Discharge status: Home / Self Care | Payer: Self-pay | Source: Ambulatory Visit | Attending: Nurse Practitioner | Admitting: Nurse Practitioner

## 2016-08-18 DIAGNOSIS — I517 Cardiomegaly: Secondary | ICD-10-CM | POA: Insufficient documentation

## 2016-08-18 DIAGNOSIS — I480 Paroxysmal atrial fibrillation: Secondary | ICD-10-CM | POA: Insufficient documentation

## 2016-08-18 LAB — ECHOCARDIOGRAM COMPLETE
E decel time: 285 msec
FS: 27 % — AB (ref 28–44)
IVS/LV PW RATIO, ED: 1.04
LA ID, A-P, ES: 47 mm
LA diam end sys: 47 mm
LA diam index: 1.76 cm/m2
LA vol A4C: 96.5 ml
LA vol index: 35.5 mL/m2
LA vol: 94.7 mL
LV PW d: 10.8 mm — AB (ref 0.6–1.1)
LVOT area: 4.91 cm2
LVOT diameter: 25 mm
MV Dec: 285
MV Peak grad: 3 mmHg
MV pk A vel: 84 m/s
MV pk E vel: 81 m/s

## 2016-08-18 NOTE — Progress Notes (Signed)
  Echocardiogram 2D Echocardiogram has been performed.  Jeffery [name] 08/18/2016, 1:35 PM

## 2016-08-25 ENCOUNTER — Ambulatory Visit (INDEPENDENT_AMBULATORY_CARE_PROVIDER_SITE_OTHER): Payer: Self-pay | Admitting: Orthopedic Surgery

## 2016-08-27 ENCOUNTER — Ambulatory Visit (HOSPITAL_COMMUNITY)
Admission: RE | Admit: 2016-08-27 | Discharge: 2016-08-27 | Disposition: A | Discharge status: Home / Self Care | Payer: Self-pay | Source: Ambulatory Visit | Attending: Nurse Practitioner | Admitting: Nurse Practitioner

## 2016-08-27 ENCOUNTER — Encounter (HOSPITAL_COMMUNITY): Payer: Self-pay | Admitting: Nurse Practitioner

## 2016-08-27 ENCOUNTER — Telehealth: Payer: Self-pay | Admitting: *Deleted

## 2016-08-27 VITALS — BP 146/86 | HR 82 | Ht 75.0 in | Wt 316.2 lb

## 2016-08-27 DIAGNOSIS — Z794 Long term (current) use of insulin: Secondary | ICD-10-CM | POA: Insufficient documentation

## 2016-08-27 DIAGNOSIS — Z96651 Presence of right artificial knee joint: Secondary | ICD-10-CM | POA: Insufficient documentation

## 2016-08-27 DIAGNOSIS — I480 Paroxysmal atrial fibrillation: Secondary | ICD-10-CM | POA: Insufficient documentation

## 2016-08-27 DIAGNOSIS — Z87891 Personal history of nicotine dependence: Secondary | ICD-10-CM | POA: Insufficient documentation

## 2016-08-27 DIAGNOSIS — I491 Atrial premature depolarization: Secondary | ICD-10-CM | POA: Insufficient documentation

## 2016-08-27 DIAGNOSIS — E119 Type 2 diabetes mellitus without complications: Secondary | ICD-10-CM | POA: Insufficient documentation

## 2016-08-27 DIAGNOSIS — M5136 Other intervertebral disc degeneration, lumbar region: Secondary | ICD-10-CM | POA: Insufficient documentation

## 2016-08-27 DIAGNOSIS — Z7901 Long term (current) use of anticoagulants: Secondary | ICD-10-CM | POA: Insufficient documentation

## 2016-08-27 DIAGNOSIS — I4891 Unspecified atrial fibrillation: Secondary | ICD-10-CM

## 2016-08-27 DIAGNOSIS — Z89422 Acquired absence of other left toe(s): Secondary | ICD-10-CM | POA: Insufficient documentation

## 2016-08-27 DIAGNOSIS — Z6839 Body mass index (BMI) 39.0-39.9, adult: Secondary | ICD-10-CM | POA: Insufficient documentation

## 2016-08-27 DIAGNOSIS — I10 Essential (primary) hypertension: Secondary | ICD-10-CM | POA: Insufficient documentation

## 2016-08-27 NOTE — Progress Notes (Signed)
Primary Care Physician: Arnoldo Morale, MD Referring Physician:F/U ER   Jeffery [name] is a 56 y.o. male with a h/o morbid obesity,DM,HTN, that was seen in the  ER after he was drinking beer in a bar the night before. The next morning he awoke feeling weak. He presented in new onset AFIb and underwent successful cardioversion. No further afib. He was placed on lopressor and eliquis 5 mg bid for a chadsvasc score of 2 (htn, DM).  He admits to snoring, drinks alcohol nightly. Some caffeine but not excessive amounts. He has RT charcot foot  and is not able to exercise.   F/u in afib clinic, 5/31. He is staying in [place] but with Pac's. He is pending a sleep study. Avoiding alcohol, was drinking 4 cases a week. No bleeding issues with apixaban. Echo results reviewed.  Today, he denies symptoms of palpitations, chest pain, shortness of breath, orthopnea, PND, lower extremity edema, dizziness, presyncope, syncope, or neurologic sequela. The patient is tolerating medications without difficulties and is otherwise without complaint today.   Past Medical History:  Diagnosis Date  . DDD (degenerative disc disease), lumbar   . Diabetes mellitus without complication ([place])   . Hypertension   . Rotator cuff disorder    Past Surgical History:  Procedure Laterality Date  . AMPUTATION Left 10/02/2014   Procedure: Left Third toe amputation ;  Surgeon: [name], MD;  Location: [facility];  Service: Orthopedics;  Laterality: Left;  Regular bed, wants to follow hip  . ANTERIOR CRUCIATE LIGAMENT REPAIR Right 90   reconstruction  . APPLICATION OF WOUND VAC Left 10/02/2014   Procedure: APPLICATION OF WOUND VAC; toe Surgeon: [name], MD;  Location: [facility];  Service: Orthopedics;  Laterality: Left;  . I&D EXTREMITY Left 10/05/2014   Procedure: IRRIGATION AND DEBRIDEMENT LEFT FOOT;  Surgeon: [name], MD;  Location: [facility];  Service: Orthopedics;  Laterality: Left;  . KNEE ARTHROSCOPY W/ ACL RECONSTRUCTION  Right   . TOTAL KNEE ARTHROPLASTY Right 03/28/2015  . TOTAL KNEE ARTHROPLASTY Right 03/28/2015   Procedure: RIGHT TOTAL KNEE ARTHROPLASTY;  Surgeon: [name], MD;  Location: [facility];  Service: Orthopedics;  Laterality: Right;    Current Outpatient Prescriptions  Medication Sig Dispense Refill  . apixaban (ELIQUIS STARTER PACK) 5 MG TABS tablet Take 1 tablet (5 mg total) by mouth 2 (two) times daily. 60 tablet 0  . Blood Glucose Monitoring Suppl (TRUE METRIX METER) DEVI 1 each by Does not apply route 3 (three) times daily before meals. 1 Device 0  . diclofenac (VOLTAREN) 75 MG EC tablet Take 1 tablet (75 mg total) by mouth 2 (two) times daily. 30 tablet 0  . glucose blood (TRUE METRIX BLOOD GLUCOSE TEST) test strip Use 3 times daily before meals 100 each 12  . insulin aspart (NOVOLOG) 100 UNIT/ML injection Inject 6 Units into the skin 3 (three) times daily with meals. 30 mL 3  . Insulin Glargine (LANTUS SOLOSTAR) 100 UNIT/ML Solostar Pen Inject 45 Units into the skin every morning. 5 pen 3  . insulin lispro (HUMALOG) 100 UNIT/ML injection Inject 0.06 mLs (6 Units total) into the skin 3 (three) times daily with meals. 30 mL 3  . Insulin Pen Needle (B-D ULTRAFINE III SHORT PEN) 31G X 8 MM MISC 1 each by Does not apply route 3 (three) times daily. 100 each 5  . Insulin Syringe-Needle U-100 (TRUEPLUS INSULIN SYRINGE) 30G X 5/16" 0.5 ML MISC Use as directed 3  times daily 100 each 5  . losartan (COZAAR) 50 MG tablet 1 tablet daily 30 tablet 3  . metoprolol (LOPRESSOR) 50 MG tablet Take 1 tablet (50 mg total) by mouth 2 (two) times daily. 60 tablet 0  . traMADol (ULTRAM) 50 MG tablet Take 1 tablet (50 mg total) by mouth every 12 (twelve) hours as needed. 40 tablet 0  . TRUEPLUS INSULIN SYRINGE 30G X 5/16" 0.5 ML MISC USE AS DIRECTED 3 TIMES DAILY 100 each 0  . TRUEPLUS LANCETS 28G MISC 1 each by Does not apply route 3 (three) times daily before meals. 100 each 12   No current facility-administered  medications for this encounter.     No Known Allergies  Social History   Social History  . Marital status: Single    Spouse name: N/A  . Number of children: N/A  . Years of education: N/A   Occupational History  . Landscaping & rental handyman    Social History Main Topics  . Smoking status: Former Smoker    Packs/day: 0.25    Years: 38.00    Types: Cigarettes  . Smokeless tobacco: Never Used     Comment: 2 cigs weekly  . Alcohol use 3.0 - 3.6 oz/week    5 - 6 Cans of beer per week     Comment: 2 -3days week goes to bar to have "a couple of beers so he can sleep"  . Drug use: Yes    Types: Marijuana     Comment: last week  . Sexual activity: Not on file   Other Topics Concern  . Not on file   Social History Narrative   Lives alone.    Family History  Problem Relation Age of Onset  . Diabetes Father   . Hypertension Father   . Heart failure Father     ROS- All systems are reviewed and negative except as per the HPI above  Physical Exam: Vitals:   08/27/16 1521  Weight: (!) 316 lb 3.2 oz (143.4 kg)  Height: 6\' 3"  (1.905 m)   Wt Readings from Last 3 Encounters:  08/27/16 (!) 316 lb 3.2 oz (143.4 kg)  08/11/16 (!) 318 lb (144.2 kg)  07/30/16 (!) 304 lb (137.9 kg)    Labs: Lab Results  Component Value Date   NA 135 08/04/2016   K 4.5 08/04/2016   CL 105 08/04/2016   CO2 22 08/04/2016   GLUCOSE 219 (H) 08/04/2016   BUN 16 08/04/2016   CREATININE 0.89 08/04/2016   CALCIUM 9.0 08/04/2016   MG 2.1 08/04/2016   Lab Results  Component Value Date   INR 1.04 08/04/2016   Lab Results  Component Value Date   CHOL  04/10/2010    192        ATP III CLASSIFICATION:  <200     mg/dL   Desirable  200-239  mg/dL   Borderline High  >=240    mg/dL   High          HDL 50 04/10/2010   LDLCALC (H) 04/10/2010    110        Total Cholesterol/HDL:CHD Risk Coronary Heart Disease Risk Table                     Men   Women  1/2 Average Risk   3.4   3.3   Average Risk       5.0   4.4  2 X Average Risk   9.6  7.1  3 X Average Risk  23.4   11.0        Use the calculated Patient Ratio above and the CHD Risk Table to determine the patient's CHD Risk.        ATP III CLASSIFICATION (LDL):  <100     mg/dL   Optimal  100-129  mg/dL   Near or Above                    Optimal  130-159  mg/dL   Borderline  160-189  mg/dL   High  >190     mg/dL   Very High   TRIG 161 (H) 04/10/2010     GEN- The patient is well appearing, alert and oriented x 3 today.   Head- normocephalic, atraumatic Eyes-  Sclera clear, conjunctiva pink Ears- hearing intact Oropharynx- clear Neck- supple, no JVP Lymph- no cervical lymphadenopathy Lungs- Clear to ausculation bilaterally, normal work of breathing Heart- Regular rate and rhythm, no murmurs, rubs or gallops, PMI not laterally displaced GI- soft, NT, ND, + BS Extremities- no clubbing, cyanosis, or edema MS- no significant deformity or atrophy Skin- no rash or lesion Psych- euthymic mood, full affect Neuro- strength and sensation are intact  EKG- NSR at 76 bpm, pr int 168 ms, qrs int 102 ms, qtc 411 ms Epic records reviewed Echo-Study Conclusions  - Left ventricle: The cavity size was mildly dilated. Wall   thickness was increased in a pattern of mild LVH. Systolic   function was normal. The estimated ejection fraction was in the   range of 50% to 55%. Wall motion was normal; there were no   regional wall motion abnormalities. Doppler parameters are   consistent with abnormal left ventricular relaxation (grade 1   diastolic dysfunction). - Aortic valve: Transvalvular velocity was within the normal range.   There was no stenosis. There was no regurgitation. - Mitral valve: Transvalvular velocity was within the normal range.   There was no evidence for stenosis. There was trivial   regurgitation. - Left atrium: The atrium was moderately dilated. - Right ventricle: The cavity size was normal. Wall  thickness was   normal. Systolic function was normal. - Tricuspid valve: There was no regurgitation.    Assessment and Plan: 1. Paroxysmal afib, new onset General education re afib Continue eliquis 5 mg bid for chadsvasc score of 2 General bleeding precautions discussed Continue lopressor  2. Lifestyle issues Encouraged whatever exercise pt could do without aggravating foot problems Limit/stop alcohol and excessive caffeine Snoring history and sleep study ordered Weight loss encouraged  f/u in 8 weeks  Butch Penny C. Vikrant Pryce, [facility] [address] [place],  [address] [phone]

## 2016-08-27 NOTE — Addendum Note (Signed)
Encounter addended by: Sherran Needs, NP on: 08/27/2016  5:11 PM<BR>    Actions taken: LOS modified

## 2016-08-27 NOTE — Telephone Encounter (Signed)
-----   Message from Juluis Mire, RN sent at 08/27/2016  3:37 PM EDT ----- Regarding: sleep study Pt need sleep study for afib. Thanks stacy

## 2016-08-31 NOTE — Telephone Encounter (Signed)
Informed patient of upcoming sleep study and patient understanding was verbalized. Patient understands his sleep study is scheduled for Sunday October 04 2016. Patient understands his sleep study will be done at [facility] sleep lab. Patient understands he will receive a sleep packet in a week or so. Patient understands to call if he does not receive the sleep packet in a timely manner. Patient agrees with treatment and thanked me for call

## 2016-09-01 ENCOUNTER — Other Ambulatory Visit: Payer: Self-pay | Admitting: Family Medicine

## 2016-09-01 DIAGNOSIS — S93491D Sprain of other ligament of right ankle, subsequent encounter: Secondary | ICD-10-CM

## 2016-09-02 ENCOUNTER — Encounter (INDEPENDENT_AMBULATORY_CARE_PROVIDER_SITE_OTHER): Payer: Self-pay | Admitting: Orthopedic Surgery

## 2016-09-02 ENCOUNTER — Ambulatory Visit (INDEPENDENT_AMBULATORY_CARE_PROVIDER_SITE_OTHER): Payer: Self-pay | Admitting: Orthopedic Surgery

## 2016-09-02 VITALS — Ht 75.0 in | Wt 316.0 lb

## 2016-09-02 DIAGNOSIS — E1142 Type 2 diabetes mellitus with diabetic polyneuropathy: Secondary | ICD-10-CM

## 2016-09-02 DIAGNOSIS — E11610 Type 2 diabetes mellitus with diabetic neuropathic arthropathy: Secondary | ICD-10-CM

## 2016-09-02 MED ORDER — GABAPENTIN 100 MG PO CAPS: 100.0000 mg | ORAL_CAPSULE | Freq: Three times a day (TID) | ORAL | 3 refills | Status: DC

## 2016-09-02 MED FILL — traMADol HCL 50 MG TABS: 50 | 20 days supply | Qty: 40 | Fill #0

## 2016-09-02 MED FILL — !LANTUS SOLOSTAR 100UNITS/M: 100 | 19 days supply | Qty: 9 | Fill #3

## 2016-09-02 MED FILL — GABAPENTIN 100 MG CAPSULE: 100 | 30 days supply | Qty: 90 | Fill #0

## 2016-09-02 NOTE — Progress Notes (Signed)
Office Visit Note   Patient: Jeffery [name]           Date of Birth: [birth year]           MRN: [id number] Visit Date: 09/02/2016              Requested by: Arnoldo Morale, MD [place], [place] [id number] PCP: Arnoldo Morale, MD  Chief Complaint  Patient presents with  . Right Foot - Follow-up    Charcot foot  . Right Ankle - Follow-up      HPI: Patient is a 64 gentleman diabetic insensate neuropathy venous stasis insufficiency with dermatitis with neuropathic pain across the midfoot with Charcot collapse and posterior tibial tendon insufficiency. Patient states he occasionally has some sharp shooting pain he has been full weightbearing in his fracture boot. Patient did not obtain his compression stockings.  Assessment & Plan: Visit Diagnoses:  1. Charcot foot due to diabetes mellitus ([place])   2. Diabetic polyneuropathy associated with type 2 diabetes mellitus ([place])     Plan: Recommend the patient go to the medical supply store to obtain the medical compression stockings. Continue with the fracture boot a prescription was called in for Neurontin. He will use one pill at night may increase up to 3 times a day if necessary. Reevaluate at follow-up in 4 weeks.  Follow-Up Instructions: Return in about 4 weeks (around 09/30/2016).   Ortho Exam  Patient is alert, oriented, no adenopathy, well-dressed, normal affect, normal respiratory effort. Objective examination he has an antalgic gait. He has brawny edema and swelling in the right lower extremity with venous stasis changes there is no open ulcer there is no cellulitis. Patient is tender to palpation across the midfoot. He does not have pronation and valgus he does have some pace planus is also tender to palpation over the posterior tibial tendon. There is no rocker bottom deformity. Last hemoglobin A1c 10.3  Imaging: No results found.  Labs: Lab Results  Component Value Date   HGBA1C 10.3 06/05/2016   HGBA1C  11.1 05/14/2016   HGBA1C 11.4 10/28/2015   ESRSEDRATE 1 03/18/2015   CRP 0.6 03/18/2015   LABURIC 4.8 07/09/2016   LABURIC 3.5 (L) 05/14/2016   REPTSTATUS 10/06/2014 FINAL 10/01/2014   GRAMSTAIN  10/01/2014    ABUNDANT WBC PRESENT, PREDOMINANTLY PMN ABUNDANT GRAM POSITIVE COCCI IN CLUSTERS FEW SQUAMOUS EPITHELIAL CELLS PRESENT Gram Stain Report Called to,Read Back By and Verified With: Vanessa Barbara [id number] @ [id number] BY J SCOTTON    GRAMSTAIN  10/01/2014    RARE WBC PRESENT, PREDOMINANTLY PMN NO SQUAMOUS EPITHELIAL CELLS SEEN ABUNDANT GRAM POSITIVE COCCI IN PAIRS IN CLUSTERS Performed at [facility]  10/01/2014    NO GROWTH 5 DAYS Performed at [facility] 10/01/2014    Orders:  No orders of the defined types were placed in this encounter.  Meds ordered this encounter  Medications  . gabapentin (NEURONTIN) 100 MG capsule    Sig: Take 1 capsule (100 mg total) by mouth 3 (three) times daily. When necessary for neuropathy pain    Dispense:  90 capsule    Refill:  3     Procedures: No procedures performed  Clinical Data: No additional findings.  ROS:  All other systems negative, except as noted in the HPI. Review of Systems  Objective: Vital Signs: Ht 6\' 3"  (1.905 m)   Wt (!) 316 lb (143.3 kg)   BMI  39.50 kg/m   Specialty Comments:  No specialty comments available.  PMFS History: Patient Active Problem List   Diagnosis Date Noted  . Pain in right ankle and joints of right foot 07/09/2016  . Diabetic polyneuropathy associated with type 2 diabetes mellitus ([place]) 07/09/2016  . Chronic venous hypertension (idiopathic) with ulcer and inflammation of right lower extremity ([place]) 07/09/2016  . Impingement syndrome of right shoulder 07/07/2016  . Right foot pain 07/07/2016  . Charcot's joint of right foot 07/03/2016  . Morbid obesity ([place]) 06/05/2016  . S/P total knee replacement using cement 03/28/2015  .  Knee osteoarthritis 11/12/2014  . Hypertension 10/15/2014  . Cellulitis of left lower extremity   . Osteomyelitis of toe of left foot ([place])   . Toe osteomyelitis, left ([place]) 10/01/2014  . Cellulitis of leg, left 10/01/2014  . Charcot foot due to diabetes mellitus ([place]) 10/01/2014  . Accelerated hypertension 10/01/2014  . Gangrene left third toe 10/01/2014  . SHOULDER JOINT INSTABILITY 05/27/2010  . SHOULDER PAIN, RIGHT 05/27/2010   Past Medical History:  Diagnosis Date  . DDD (degenerative disc disease), lumbar   . Diabetes mellitus without complication ([place])   . Hypertension   . Rotator cuff disorder     Family History  Problem Relation Age of Onset  . Diabetes Father   . Hypertension Father   . Heart failure Father     Past Surgical History:  Procedure Laterality Date  . AMPUTATION Left 10/02/2014   Procedure: Left Third toe amputation ;  Surgeon: [name], MD;  Location: [facility];  Service: Orthopedics;  Laterality: Left;  Regular bed, wants to follow hip  . ANTERIOR CRUCIATE LIGAMENT REPAIR Right 90   reconstruction  . APPLICATION OF WOUND VAC Left 10/02/2014   Procedure: APPLICATION OF WOUND VAC; toe Surgeon: [name], MD;  Location: [facility];  Service: Orthopedics;  Laterality: Left;  . I&D EXTREMITY Left 10/05/2014   Procedure: IRRIGATION AND DEBRIDEMENT LEFT FOOT;  Surgeon: [name], MD;  Location: [facility];  Service: Orthopedics;  Laterality: Left;  . KNEE ARTHROSCOPY W/ ACL RECONSTRUCTION Right   . TOTAL KNEE ARTHROPLASTY Right 03/28/2015  . TOTAL KNEE ARTHROPLASTY Right 03/28/2015   Procedure: RIGHT TOTAL KNEE ARTHROPLASTY;  Surgeon: [name], MD;  Location: [facility];  Service: Orthopedics;  Laterality: Right;   Social History   Occupational History  . Landscaping & rental handyman    Social History Main Topics  . Smoking status: Former Smoker    Packs/day: 0.25    Years: 38.00    Types: Cigarettes  . Smokeless tobacco: Never Used     Comment: 2 cigs weekly   . Alcohol use 3.0 - 3.6 oz/week    5 - 6 Cans of beer per week     Comment: 2 -3days week goes to bar to have "a couple of beers so he can sleep"  . Drug use: Yes    Types: Marijuana     Comment: last week  . Sexual activity: Not on file

## 2016-09-04 ENCOUNTER — Other Ambulatory Visit: Payer: Self-pay | Admitting: Physician Assistant

## 2016-09-04 ENCOUNTER — Other Ambulatory Visit (HOSPITAL_COMMUNITY): Payer: Self-pay | Admitting: *Deleted

## 2016-09-04 DIAGNOSIS — I10 Essential (primary) hypertension: Secondary | ICD-10-CM

## 2016-09-04 MED ORDER — METOPROLOL TARTRATE 50 MG PO TABS: 50.0000 mg | ORAL_TABLET | Freq: Two times a day (BID) | ORAL | 3 refills | Status: DC

## 2016-09-04 MED ORDER — APIXABAN 5 MG PO TABS: 5.0000 mg | ORAL_TABLET | Freq: Two times a day (BID) | ORAL | 3 refills | Status: DC

## 2016-09-04 MED FILL — METOPROLOL TARTRATE 50 MG T: 50 | 30 days supply | Qty: 60 | Fill #0

## 2016-09-04 MED FILL — LOSARTAN POTASSIUM 50 MG TA: 50 | 30 days supply | Qty: 30 | Fill #3

## 2016-09-07 MED FILL — !ELIQUIS 5MG TABLET: 5 | 30 days supply | Qty: 60 | Fill #0

## 2016-09-23 NOTE — Telephone Encounter (Signed)
Patient called today to cancel his sleep study scheduled for October 04 2016 for personal reasons and he will call at a later date to reschedule.

## 2016-10-01 ENCOUNTER — Ambulatory Visit (INDEPENDENT_AMBULATORY_CARE_PROVIDER_SITE_OTHER): Payer: Self-pay

## 2016-10-01 ENCOUNTER — Encounter (HOSPITAL_COMMUNITY): Payer: Self-pay | Admitting: Emergency Medicine

## 2016-10-01 ENCOUNTER — Other Ambulatory Visit: Payer: Self-pay | Admitting: Family Medicine

## 2016-10-01 ENCOUNTER — Ambulatory Visit (INDEPENDENT_AMBULATORY_CARE_PROVIDER_SITE_OTHER): Payer: Self-pay | Admitting: Orthopedic Surgery

## 2016-10-01 ENCOUNTER — Other Ambulatory Visit: Payer: Self-pay | Admitting: Physician Assistant

## 2016-10-01 ENCOUNTER — Emergency Department (HOSPITAL_COMMUNITY)
Admission: EM | Admit: 2016-10-01 | Discharge: 2016-10-01 | Disposition: A | Discharge status: Home / Self Care | Payer: Self-pay | Attending: Emergency Medicine | Admitting: Emergency Medicine

## 2016-10-01 DIAGNOSIS — Z794 Long term (current) use of insulin: Secondary | ICD-10-CM

## 2016-10-01 DIAGNOSIS — M545 Low back pain, unspecified: Secondary | ICD-10-CM

## 2016-10-01 DIAGNOSIS — Z96651 Presence of right artificial knee joint: Secondary | ICD-10-CM | POA: Insufficient documentation

## 2016-10-01 DIAGNOSIS — Z87891 Personal history of nicotine dependence: Secondary | ICD-10-CM | POA: Insufficient documentation

## 2016-10-01 DIAGNOSIS — Z79899 Other long term (current) drug therapy: Secondary | ICD-10-CM | POA: Insufficient documentation

## 2016-10-01 DIAGNOSIS — M14671 Charcot's joint, right ankle and foot: Secondary | ICD-10-CM

## 2016-10-01 DIAGNOSIS — G8929 Other chronic pain: Secondary | ICD-10-CM

## 2016-10-01 DIAGNOSIS — E1165 Type 2 diabetes mellitus with hyperglycemia: Secondary | ICD-10-CM

## 2016-10-01 DIAGNOSIS — E118 Type 2 diabetes mellitus with unspecified complications: Secondary | ICD-10-CM

## 2016-10-01 DIAGNOSIS — I10 Essential (primary) hypertension: Secondary | ICD-10-CM

## 2016-10-01 DIAGNOSIS — Z7901 Long term (current) use of anticoagulants: Secondary | ICD-10-CM | POA: Insufficient documentation

## 2016-10-01 DIAGNOSIS — E119 Type 2 diabetes mellitus without complications: Secondary | ICD-10-CM | POA: Insufficient documentation

## 2016-10-01 DIAGNOSIS — IMO0002 Reserved for concepts with insufficient information to code with codable children: Secondary | ICD-10-CM

## 2016-10-01 DIAGNOSIS — S93491D Sprain of other ligament of right ankle, subsequent encounter: Secondary | ICD-10-CM

## 2016-10-01 MED ORDER — PREDNISONE 10 MG PO TABS: 20.0000 mg | ORAL_TABLET | Freq: Every day | ORAL | 0 refills | Status: DC

## 2016-10-01 MED ORDER — CYCLOBENZAPRINE HCL 10 MG PO TABS
10.0000 mg | ORAL_TABLET | Freq: Once | ORAL | Status: AC
  Administered 2016-10-01: 10 mg via ORAL
  Filled 2016-10-01: qty 1

## 2016-10-01 MED ORDER — CYCLOBENZAPRINE HCL 10 MG PO TABS: 10.0000 mg | ORAL_TABLET | Freq: Two times a day (BID) | ORAL | 0 refills | Status: DC | PRN

## 2016-10-01 MED ORDER — OXYCODONE-ACETAMINOPHEN 5-325 MG PO TABS
2.0000 | ORAL_TABLET | Freq: Once | ORAL | Status: AC
  Administered 2016-10-01: 2 via ORAL
  Filled 2016-10-01: qty 2

## 2016-10-01 MED FILL — ?PREDNISONE 10 MG TABLET: 10 | 30 days supply | Qty: 45 | Fill #0

## 2016-10-01 MED FILL — $Humalog 100u/ml vial: 100 | 28 days supply | Qty: 10 | Fill #1

## 2016-10-01 MED FILL — LOSARTAN POTASSIUM 50 MG TA: 50 | 30 days supply | Qty: 30 | Fill #0

## 2016-10-01 MED FILL — $LANTUS SOLOSTAR 100 UNITS/: 100 | 19 days supply | Qty: 9 | Fill #4

## 2016-10-01 MED FILL — ?CYCLOBENZAPRINE 10 MG TABL: 10 | 10 days supply | Qty: 20 | Fill #0

## 2016-10-01 NOTE — Discharge Instructions (Signed)
Please call your orthopedist to schedule a follow-up appointment regarding your low back pain to discuss long-term management. Please return to the emergency department if symptoms worsen or if he develops new symptoms. Please do not drive or go to work after taking Flexeril. Here are some stretches that may help the muscles in your lower back.   Stretches for Low Back Pain      Back Flexion Stretch. Lying on the back, pull both knees to the chest while simultaneously flexing the head forward until a comfortable stretch is felt across the mid and low back.      Knee to Chest Stretch. Lie on the back with the knees bent and both heels on the floor, then place both hands behind one knee and pull it toward the chest, stretching the gluteus and piriformis muscles in the buttock.  Kneeling Lunge Stretch. Starting on both knees, move one leg forward so the foot is flat on the ground, keeping weight evenly distributed through both hips (rather than on one side or the other). Place both hands on the top of the thigh, and gently lean the body forward to feel a stretch in the front of the other leg. This stretch affects the hip flexor muscles, which attach to the pelvis and can impact posture if too tight.    Piriformis Muscle Stretch. Lie on the back with knees bent and both heels on the floor. Cross one leg over the other, resting the ankle on the bent knee, then gently pull the bottom knee toward the chest until a stretch is felt in the buttock. Or, lying on the floor, cross one leg over the other and pull it forward over the body at the knee, keeping the other leg flat.

## 2016-10-01 NOTE — ED Triage Notes (Signed)
Pt. 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 I can't hardly walk the pain is so bad.

## 2016-10-01 NOTE — ED Provider Notes (Signed)
[facility] DEPT Provider Note   CSN: [id number] Arrival date & time: 10/01/16  6283   By signing my name below, I, Theresia Bough, attest that this documentation has been prepared under the direction and in the presence of non-physician practitioner, Kazaria Gaertner A., PA-C. Electronically Signed: Theresia Bough, ED Scribe. 10/01/16. 9:28 AM.  History   Chief Complaint Chief Complaint  Patient presents with  . Back Pain   The history is provided by the patient. No language interpreter was used.   HPI Comments: Jeffery [name] is a 56 y.o. male with a PMHx of chronic back pain due to DDD, who presents to the Emergency Department complaining of 9/10, worsening back pain onset yesterday. Pt states he has had back pain for 15 years and when it flares up he has difficulty moving and sleeping. No injury or trauma to the area. Pt reports associated chills. Pt has tried pain medication with minimal relief of his pain. Pt denies fever, numbness/tingling, rash, CP, nausea, vomiting, urinary/bowel incontinence or any other complaints at this time.  Past Medical History:  Diagnosis Date  . DDD (degenerative disc disease), lumbar   . Diabetes mellitus without complication ([place])   . Hypertension   . Rotator cuff disorder     Patient Active Problem List   Diagnosis Date Noted  . Pain in right ankle and joints of right foot 07/09/2016  . Diabetic polyneuropathy associated with type 2 diabetes mellitus ([place]) 07/09/2016  . Chronic venous hypertension (idiopathic) with ulcer and inflammation of right lower extremity ([place]) 07/09/2016  . Impingement syndrome of right shoulder 07/07/2016  . Right foot pain 07/07/2016  . Charcot's joint of right foot 07/03/2016  . Morbid obesity ([place]) 06/05/2016  . S/P total knee replacement using cement 03/28/2015  . Knee osteoarthritis 11/12/2014  . Hypertension 10/15/2014  . Cellulitis of left lower extremity   . Osteomyelitis of toe of left foot ([place])     . Toe osteomyelitis, left ([place]) 10/01/2014  . Cellulitis of leg, left 10/01/2014  . Charcot foot due to diabetes mellitus ([place]) 10/01/2014  . Accelerated hypertension 10/01/2014  . Gangrene left third toe 10/01/2014  . SHOULDER JOINT INSTABILITY 05/27/2010  . SHOULDER PAIN, RIGHT 05/27/2010    Past Surgical History:  Procedure Laterality Date  . AMPUTATION Left 10/02/2014   Procedure: Left Third toe amputation ;  Surgeon: [name], MD;  Location: [facility];  Service: Orthopedics;  Laterality: Left;  Regular bed, wants to follow hip  . ANTERIOR CRUCIATE LIGAMENT REPAIR Right 90   reconstruction  . APPLICATION OF WOUND VAC Left 10/02/2014   Procedure: APPLICATION OF WOUND VAC; toe Surgeon: [name], MD;  Location: [facility];  Service: Orthopedics;  Laterality: Left;  . I&D EXTREMITY Left 10/05/2014   Procedure: IRRIGATION AND DEBRIDEMENT LEFT FOOT;  Surgeon: [name], MD;  Location: [facility];  Service: Orthopedics;  Laterality: Left;  . KNEE ARTHROSCOPY W/ ACL RECONSTRUCTION Right   . TOTAL KNEE ARTHROPLASTY Right 03/28/2015  . TOTAL KNEE ARTHROPLASTY Right 03/28/2015   Procedure: RIGHT TOTAL KNEE ARTHROPLASTY;  Surgeon: [name], MD;  Location: [facility];  Service: Orthopedics;  Laterality: Right;       Home Medications    Prior to Admission medications   Medication Sig Start Date End Date Taking? Authorizing Provider  apixaban (ELIQUIS) 5 MG TABS tablet Take 1 tablet (5 mg total) by mouth 2 (two) times daily. 09/04/16   Sherran Needs, NP  Blood Glucose Monitoring  Suppl (TRUE METRIX METER) DEVI 1 each by Does not apply route 3 (three) times daily before meals. 06/19/16   Arnoldo Morale, MD  cyclobenzaprine (FLEXERIL) 10 MG tablet Take 1 tablet (10 mg total) by mouth 2 (two) times daily as needed for muscle spasms. 10/01/16   Ronette Hank A, PA-C  diclofenac (VOLTAREN) 75 MG EC tablet Take 1 tablet (75 mg total) by mouth 2 (two) times daily. 07/24/16   [name], MD  gabapentin  (NEURONTIN) 100 MG capsule Take 1 capsule (100 mg total) by mouth 3 (three) times daily. When necessary for neuropathy pain 09/02/16   Newt Minion, MD  glucose blood (TRUE METRIX BLOOD GLUCOSE TEST) test strip Use 3 times daily before meals 06/19/16   Arnoldo Morale, MD  insulin aspart (NOVOLOG) 100 UNIT/ML injection Inject 6 Units into the skin 3 (three) times daily with meals. 06/05/16   Arnoldo Morale, MD  Insulin Glargine (LANTUS SOLOSTAR) 100 UNIT/ML Solostar Pen Inject 45 Units into the skin every morning. 06/05/16   Arnoldo Morale, MD  insulin lispro (HUMALOG) 100 UNIT/ML injection Inject 0.06 mLs (6 Units total) into the skin 3 (three) times daily with meals. 04/24/16   Tresa Garter, MD  Insulin Pen Needle (B-D ULTRAFINE III SHORT PEN) 31G X 8 MM MISC 1 each by Does not apply route 3 (three) times daily. 11/12/14   Arnoldo Morale, MD  Insulin Syringe-Needle U-100 (TRUEPLUS INSULIN SYRINGE) 30G X 5/16" 0.5 ML MISC Use as directed 3 times daily 12/14/14   Arnoldo Morale, MD  losartan (COZAAR) 50 MG tablet TAKE 1 TABLET BY MOUTH DAILY 10/01/16   Arnoldo Morale, MD  metoprolol tartrate (LOPRESSOR) 50 MG tablet Take 1 tablet (50 mg total) by mouth 2 (two) times daily. 09/04/16   Sherran Needs, NP  predniSONE (DELTASONE) 10 MG tablet Take 2 tablets (20 mg total) by mouth daily with breakfast. For 2 weeks then 10 mg a day for 2 weeks. 10/01/16   Newt Minion, MD  traMADol (ULTRAM) 50 MG tablet TAKE ONE TABLET BY MOUTH EVERY 12 HOURS AS NEEDED 09/02/16   Arnoldo Morale, MD  TRUEPLUS INSULIN SYRINGE 30G X 5/16" 0.5 ML MISC USE AS DIRECTED 3 TIMES DAILY 04/03/16   Tresa Garter, MD  TRUEPLUS LANCETS 28G MISC 1 each by Does not apply route 3 (three) times daily before meals. 06/19/16   Arnoldo Morale, MD    Family History Family History  Problem Relation Age of Onset  . Diabetes Father   . Hypertension Father   . Heart failure Father     Social History Social History  Substance Use Topics  . Smoking  status: Former Smoker    Packs/day: 0.25    Years: 38.00    Types: Cigarettes  . Smokeless tobacco: Never Used     Comment: 2 cigs weekly  . Alcohol use 3.0 - 3.6 oz/week    5 - 6 Cans of beer per week     Comment: 2 -3days week goes to bar to have "a couple of beers so he can sleep"     Allergies   Patient has no known allergies.   Review of Systems Review of Systems  Constitutional: Negative for activity change, chills and fever.  Respiratory: Negative for shortness of breath.   Cardiovascular: Negative for chest pain.  Gastrointestinal: Negative for abdominal pain, nausea and vomiting.  Genitourinary: Negative for enuresis.       No urinary or fecal incontinence.  Musculoskeletal:  Positive for arthralgias, back pain and gait problem. Negative for myalgias.  Skin: Negative for rash and wound.  Neurological: Negative for weakness and numbness.   Physical Exam Updated Vital Signs BP (!) 143/95 (BP Location: Left Arm)   Pulse (!) 101   Temp 98.2 F (36.8 C) (Oral)   Resp 18   Ht 6' 2.5" (1.892 m)   Wt (!) 142.9 kg (315 lb)   SpO2 98%   BMI 39.90 kg/m   Physical Exam  Constitutional: He appears well-developed.  HENT:  Head: Normocephalic.  Eyes: Conjunctivae are normal.  Neck: Neck supple.  Cardiovascular: Normal rate and regular rhythm.   No murmur heard. Pulmonary/Chest: Effort normal.  Abdominal: Soft. He exhibits no distension.  Musculoskeletal: Normal range of motion. He exhibits tenderness. He exhibits no edema.  Full ROM with flexion, extension, lateral flexion and rotation of the neck. He has no TTP of the cervical or thoracic spine or surrounding paraspinal muscles. TTP over the bony midline of the lumbar spine near L5. Antalgic gait. Cam Walker noted to the right foot.   Neurological: He is alert.  Skin: Skin is warm and dry.  Psychiatric: His behavior is normal.  Nursing note and vitals reviewed.    ED Treatments / Results  DIAGNOSTIC  STUDIES: Oxygen Saturation is 98% on RA, normal by my interpretation.   COORDINATION OF CARE: 9:19 AM-Discussed next steps with pt including follow up with ortho and treatment with a muscle relaxant and an antiinflammatory. Pt verbalized understanding and is agreeable with the plan.   Labs (all labs ordered are listed, but only abnormal results are displayed) Labs Reviewed - No data to display  EKG  EKG Interpretation None       Radiology Xr Foot Complete Right  Result Date: 10/01/2016 Three-view radiographs of the right foot shows collapse of the talonavicular joint with no rocker-bottom deformity with hypertrophic callus dorsally. No other fractures.   Xr Lumbar Spine 2-3 Views  Result Date: 10/01/2016 2 view radiographs of the lumbar spine shows advanced degenerative disc disease throughout the entire lumbar spine with osteophytic bone spurs joint space narrowing and the largest bone spurs at L4-5.   Procedures Procedures (including critical care time)  Medications Ordered in ED Medications  cyclobenzaprine (FLEXERIL) tablet 10 mg (10 mg Oral Given 10/01/16 0939)  oxyCODONE-acetaminophen (PERCOCET/ROXICET) 5-325 MG per tablet 2 tablet (2 tablets Oral Given 10/01/16 0939)     Initial Impression / Assessment and Plan / ED Course  I have reviewed the triage vital signs and the nursing notes.  Pertinent labs & imaging results that were available during my care of the patient were reviewed by me and considered in my medical decision making (see chart for details).     Patient with a h/o of an L5 herniated disk presents with acute on chronic low back pain likely exacerbated by helping a friend re-tile his bathroom and from [place] in place from his Charcot right foot. No new trauma or injury. No neurological deficits and normal neuro exam.  Patient is ambulatory.  No loss of bowel or bladder control.  No concern for cauda equina.  No fever, night sweats, weight loss, h/o  cancer, IVDA, no recent procedure to back. No urinary symptoms suggestive of UTI.  The patient has a follow-up appointment this afternoon with Ortho for his foot. Will provide the patient with a short course of Flexeril, which he states helped years ago when he had a previous flare-up. Recommended Tylenol for pain  control since the pt has a h/o of Afib s/p Eliquis. Supportive care and return precaution discussed. Appears safe for discharge at this time. Follow up as indicated in discharge paperwork.   Final Clinical Impressions(s) / ED Diagnoses   Final diagnoses:  Chronic midline low back pain without sciatica    New Prescriptions Discharge Medication List as of 10/01/2016  9:36 AM    START taking these medications   Details  cyclobenzaprine (FLEXERIL) 10 MG tablet Take 1 tablet (10 mg total) by mouth 2 (two) times daily as needed for muscle spasms., Starting Thu 10/01/2016, Print       I personally performed the services described in this documentation, which was scribed in my presence. The recorded information has been reviewed and is accurate.     Joanne Gavel, PA-C 10/01/16 Kathrynn Ducking, MD 10/03/16 [phone]

## 2016-10-01 NOTE — Progress Notes (Signed)
Office Visit Note   Patient: Jeffery [name]           Date of Birth: April 16, 1960           MRN: [id number] Visit Date: 10/01/2016              Requested by: Arnoldo Morale, MD [place], [place] [address] PCP: Arnoldo Morale, MD  No chief complaint on file.     HPI: Patient states is helping a friend removed from the floor is doing repetitive lifting and bending of bathroom tile. Patient also complains of persistent pain along the posterior tibial tendon on the right foot. He is currently wearing the medical compression socks on the right leg and wearing a fracture boot on the right. Patient denies any radicular pain that we states he has had radicular symptoms in the past his pain at this time is primarily focused in the lumbar spine. He just went to the emergency room today and received a prescription for Flexeril. Patient states he cannot take nonsteroidals due to his atrial fibrillation.  Assessment & Plan: Visit Diagnoses:  1. Charcot's joint of right foot   2. Chronic midline low back pain without sciatica     Plan: Patient will get over-the-counter orthotics to further support the arch on the right foot continue with a fracture boot continue with the compression stockings. Prescription was written for prednisone to take 20 mg every morning for 2 weeks then 10 mg a morning for 2 weeks. Recommended heat to decrease the spasm recommended walking.   Follow-Up Instructions: Return in about 3 weeks (around 10/22/2016).   Ortho Exam  Patient is alert, oriented, no adenopathy, well-dressed, normal affect, normal respiratory effort.Patient has an antalgic gait. Examination patient has a negative straight leg raise bilaterally he has no focal motor weakness in either lower extremity no signs of sciatic tension no signs of motor deficit. His right foot has a good pulse he has tender to palpation across the talonavicular joint as well as tenderness to palpation across the  posterior tibial tendon. There is swelling along the posterior tibial tendon. He has no pronation and valgus to the forefoot he does have a decreased arch.  Imaging: Xr Foot Complete Right  Result Date: 10/01/2016 Three-view radiographs of the right foot shows collapse of the talonavicular joint with no rocker-bottom deformity with hypertrophic callus dorsally. No other fractures.   Xr Lumbar Spine 2-3 Views  Result Date: 10/01/2016 2 view radiographs of the lumbar spine shows advanced degenerative disc disease throughout the entire lumbar spine with osteophytic bone spurs joint space narrowing and the largest bone spurs at L4-5.   Labs: Lab Results  Component Value Date   HGBA1C 10.3 06/05/2016   HGBA1C 11.1 05/14/2016   HGBA1C 11.4 10/28/2015   ESRSEDRATE 1 03/18/2015   CRP 0.6 03/18/2015   LABURIC 4.8 07/09/2016   LABURIC 3.5 (L) 05/14/2016   REPTSTATUS 10/06/2014 FINAL 10/01/2014   GRAMSTAIN  10/01/2014    ABUNDANT WBC PRESENT, PREDOMINANTLY PMN ABUNDANT GRAM POSITIVE COCCI IN CLUSTERS FEW SQUAMOUS EPITHELIAL CELLS PRESENT Gram Stain Report Called to,Read Back By and Verified With: Vanessa Barbara [id number] @ [id number]  10/01/2014    RARE WBC PRESENT, PREDOMINANTLY PMN NO SQUAMOUS EPITHELIAL CELLS SEEN ABUNDANT GRAM POSITIVE COCCI IN PAIRS IN CLUSTERS Performed at [facility]  10/01/2014    NO GROWTH 5 DAYS Performed at [facility]  [place] STAPHYLOCOCCUS AUREUS 10/01/2014    Orders:  Orders Placed This Encounter  Procedures  . XR Lumbar Spine 2-3 Views  . XR Foot Complete Right   Meds ordered this encounter  Medications  . predniSONE (DELTASONE) 10 MG tablet    Sig: Take 2 tablets (20 mg total) by mouth daily with breakfast. For 2 weeks then 10 mg a day for 2 weeks.    Dispense:  60 tablet    Refill:  0     Procedures: No procedures performed  Clinical Data: No additional findings.  ROS:  All other  systems negative, except as noted in the HPI. Review of Systems  Objective: Vital Signs: There were no vitals taken for this visit.  Specialty Comments:  No specialty comments available.  PMFS History: Patient Active Problem List   Diagnosis Date Noted  . Pain in right ankle and joints of right foot 07/09/2016  . Diabetic polyneuropathy associated with type 2 diabetes mellitus ([place]) 07/09/2016  . Chronic venous hypertension (idiopathic) with ulcer and inflammation of right lower extremity ([place]) 07/09/2016  . Impingement syndrome of right shoulder 07/07/2016  . Right foot pain 07/07/2016  . Charcot's joint of right foot 07/03/2016  . Morbid obesity ([place]) 06/05/2016  . S/P total knee replacement using cement 03/28/2015  . Knee osteoarthritis 11/12/2014  . Hypertension 10/15/2014  . Cellulitis of left lower extremity   . Osteomyelitis of toe of left foot ([place])   . Toe osteomyelitis, left ([place]) 10/01/2014  . Cellulitis of leg, left 10/01/2014  . Charcot foot due to diabetes mellitus ([place]) 10/01/2014  . Accelerated hypertension 10/01/2014  . Gangrene left third toe 10/01/2014  . SHOULDER JOINT INSTABILITY 05/27/2010  . SHOULDER PAIN, RIGHT 05/27/2010   Past Medical History:  Diagnosis Date  . DDD (degenerative disc disease), lumbar   . Diabetes mellitus without complication ([place])   . Hypertension   . Rotator cuff disorder     Family History  Problem Relation Age of Onset  . Diabetes Father   . Hypertension Father   . Heart failure Father     Past Surgical History:  Procedure Laterality Date  . AMPUTATION Left 10/02/2014   Procedure: Left Third toe amputation ;  Surgeon: [name], MD;  Location: [facility];  Service: Orthopedics;  Laterality: Left;  Regular bed, wants to follow hip  . ANTERIOR CRUCIATE LIGAMENT REPAIR Right 90   reconstruction  . APPLICATION OF WOUND VAC Left 10/02/2014   Procedure: APPLICATION OF WOUND VAC; toe Surgeon: [name], MD;  Location: [facility];   Service: Orthopedics;  Laterality: Left;  . I&D EXTREMITY Left 10/05/2014   Procedure: IRRIGATION AND DEBRIDEMENT LEFT FOOT;  Surgeon: [name], MD;  Location: [facility];  Service: Orthopedics;  Laterality: Left;  . KNEE ARTHROSCOPY W/ ACL RECONSTRUCTION Right   . TOTAL KNEE ARTHROPLASTY Right 03/28/2015  . TOTAL KNEE ARTHROPLASTY Right 03/28/2015   Procedure: RIGHT TOTAL KNEE ARTHROPLASTY;  Surgeon: [name], MD;  Location: [facility];  Service: Orthopedics;  Laterality: Right;   Social History   Occupational History  . Landscaping & rental handyman    Social History Main Topics  . Smoking status: Former Smoker    Packs/day: 0.25    Years: 38.00    Types: Cigarettes  . Smokeless tobacco: Never Used     Comment: 2 cigs weekly  . Alcohol use 3.0 - 3.6 oz/week    5 - 6 Cans of beer per week  Comment: 2 -3days week goes to bar to have "a couple of beers so he can sleep"  . Drug use: Yes    Types: Marijuana     Comment: last week  . Sexual activity: Not on file

## 2016-10-04 ENCOUNTER — Encounter (HOSPITAL_BASED_OUTPATIENT_CLINIC_OR_DEPARTMENT_OTHER): Payer: Self-pay

## 2016-10-15 ENCOUNTER — Encounter (HOSPITAL_COMMUNITY): Payer: Self-pay | Admitting: Nurse Practitioner

## 2016-10-15 ENCOUNTER — Ambulatory Visit (HOSPITAL_COMMUNITY)
Admission: RE | Admit: 2016-10-15 | Discharge: 2016-10-15 | Disposition: A | Discharge status: Home / Self Care | Payer: Self-pay | Source: Ambulatory Visit | Attending: Nurse Practitioner | Admitting: Nurse Practitioner

## 2016-10-15 VITALS — BP 116/82 | HR 106 | Ht 74.5 in | Wt 308.8 lb

## 2016-10-15 DIAGNOSIS — Z6839 Body mass index (BMI) 39.0-39.9, adult: Secondary | ICD-10-CM | POA: Insufficient documentation

## 2016-10-15 DIAGNOSIS — Z833 Family history of diabetes mellitus: Secondary | ICD-10-CM | POA: Insufficient documentation

## 2016-10-15 DIAGNOSIS — M5136 Other intervertebral disc degeneration, lumbar region: Secondary | ICD-10-CM | POA: Insufficient documentation

## 2016-10-15 DIAGNOSIS — I480 Paroxysmal atrial fibrillation: Secondary | ICD-10-CM | POA: Insufficient documentation

## 2016-10-15 DIAGNOSIS — Z89422 Acquired absence of other left toe(s): Secondary | ICD-10-CM | POA: Insufficient documentation

## 2016-10-15 DIAGNOSIS — E119 Type 2 diabetes mellitus without complications: Secondary | ICD-10-CM | POA: Insufficient documentation

## 2016-10-15 DIAGNOSIS — I10 Essential (primary) hypertension: Secondary | ICD-10-CM | POA: Insufficient documentation

## 2016-10-15 DIAGNOSIS — Z87891 Personal history of nicotine dependence: Secondary | ICD-10-CM | POA: Insufficient documentation

## 2016-10-15 DIAGNOSIS — Z96651 Presence of right artificial knee joint: Secondary | ICD-10-CM | POA: Insufficient documentation

## 2016-10-15 DIAGNOSIS — Z7902 Long term (current) use of antithrombotics/antiplatelets: Secondary | ICD-10-CM | POA: Insufficient documentation

## 2016-10-15 DIAGNOSIS — Z8249 Family history of ischemic heart disease and other diseases of the circulatory system: Secondary | ICD-10-CM | POA: Insufficient documentation

## 2016-10-15 DIAGNOSIS — Z9889 Other specified postprocedural states: Secondary | ICD-10-CM | POA: Insufficient documentation

## 2016-10-15 DIAGNOSIS — Z794 Long term (current) use of insulin: Secondary | ICD-10-CM | POA: Insufficient documentation

## 2016-10-15 NOTE — Progress Notes (Signed)
Primary Care Physician: Arnoldo Morale, MD Referring Physician:F/U ER   Jeffery [name] is a 56 y.o. male with a h/o morbid obesity,DM,HTN, that was seen in the  ER after he was drinking beer in a bar the night before. The next morning he awoke feeling weak. He presented in new onset AFIb and underwent successful cardioversion. No further afib. He was placed on lopressor and eliquis 5 mg bid for a chadsvasc score of 2 (htn, DM).  He admits to snoring, drinks alcohol nightly. Some caffeine but not excessive amounts. He has RT charcot foot  and is not able to exercise.   F/u in afib clinic, 5/31. He is staying in [place] but with Pac's. He is pending a sleep study. Avoiding alcohol, was drinking 4 cases a week. No bleeding issues with apixaban. Echo results reviewed.  F/u in afib clinic, 7/19, he has not noted any further afib. Continues on eliquis without bleeding issues. He had to reschedule sleep study due to back injury.He has lost 12 lbs.  Today, he denies symptoms of palpitations, chest pain, shortness of breath, orthopnea, PND, lower extremity edema, dizziness, presyncope, syncope, or neurologic sequela. The patient is tolerating medications without difficulties and is otherwise without complaint today.   Past Medical History:  Diagnosis Date  . DDD (degenerative disc disease), lumbar   . Diabetes mellitus without complication ([place])   . Hypertension   . Rotator cuff disorder    Past Surgical History:  Procedure Laterality Date  . AMPUTATION Left 10/02/2014   Procedure: Left Third toe amputation ;  Surgeon: [name], MD;  Location: [place];  Service: Orthopedics;  Laterality: Left;  Regular bed, wants to follow hip  . ANTERIOR CRUCIATE LIGAMENT REPAIR Right 90   reconstruction  . APPLICATION OF WOUND VAC Left 10/02/2014   Procedure: APPLICATION OF WOUND VAC; toe Surgeon: [name], MD;  Location: [place];  Service: Orthopedics;  Laterality: Left;  . I&D EXTREMITY Left 10/05/2014   Procedure: IRRIGATION AND DEBRIDEMENT LEFT FOOT;  Surgeon: [name], MD;  Location: [place];  Service: Orthopedics;  Laterality: Left;  . KNEE ARTHROSCOPY W/ ACL RECONSTRUCTION Right   . TOTAL KNEE ARTHROPLASTY Right 03/28/2015  . TOTAL KNEE ARTHROPLASTY Right 03/28/2015   Procedure: RIGHT TOTAL KNEE ARTHROPLASTY;  Surgeon: [name], MD;  Location: [facility];  Service: Orthopedics;  Laterality: Right;    Current Outpatient Prescriptions  Medication Sig Dispense Refill  . apixaban (ELIQUIS) 5 MG TABS tablet Take 1 tablet (5 mg total) by mouth 2 (two) times daily. 60 tablet 3  . Blood Glucose Monitoring Suppl (TRUE METRIX METER) DEVI 1 each by Does not apply route 3 (three) times daily before meals. 1 Device 0  . cyclobenzaprine (FLEXERIL) 10 MG tablet Take 1 tablet (10 mg total) by mouth 2 (two) times daily as needed for muscle spasms. 20 tablet 0  . diclofenac (VOLTAREN) 75 MG EC tablet Take 1 tablet (75 mg total) by mouth 2 (two) times daily. 30 tablet 0  . gabapentin (NEURONTIN) 100 MG capsule Take 1 capsule (100 mg total) by mouth 3 (three) times daily. When necessary for neuropathy pain 90 capsule 3  . glucose blood (TRUE METRIX BLOOD GLUCOSE TEST) test strip Use 3 times daily before meals 100 each 12  . insulin aspart (NOVOLOG) 100 UNIT/ML injection Inject 6 Units into the skin 3 (three) times daily with meals. 30 mL 3  . Insulin Glargine (LANTUS SOLOSTAR) 100 UNIT/ML Solostar Pen  Inject 45 Units into the skin every morning. 5 pen 3  . insulin lispro (HUMALOG) 100 UNIT/ML injection Inject 0.06 mLs (6 Units total) into the skin 3 (three) times daily with meals. 30 mL 3  . Insulin Pen Needle (B-D ULTRAFINE III SHORT PEN) 31G X 8 MM MISC 1 each by Does not apply route 3 (three) times daily. 100 each 5  . Insulin Syringe-Needle U-100 (TRUEPLUS INSULIN SYRINGE) 30G X 5/16" 0.5 ML MISC Use as directed 3 times daily 100 each 5  . losartan (COZAAR) 50 MG tablet TAKE 1 TABLET BY MOUTH DAILY 30  tablet 0  . metoprolol tartrate (LOPRESSOR) 50 MG tablet Take 1 tablet (50 mg total) by mouth 2 (two) times daily. 60 tablet 3  . traMADol (ULTRAM) 50 MG tablet TAKE ONE TABLET BY MOUTH EVERY 12 HOURS AS NEEDED 40 tablet 0  . TRUEPLUS INSULIN SYRINGE 30G X 5/16" 0.5 ML MISC USE AS DIRECTED 3 TIMES DAILY 100 each 0  . TRUEPLUS LANCETS 28G MISC 1 each by Does not apply route 3 (three) times daily before meals. 100 each 12   No current facility-administered medications for this encounter.     No Known Allergies  Social History   Social History  . Marital status: Single    Spouse name: N/A  . Number of children: N/A  . Years of education: N/A   Occupational History  . Landscaping & rental handyman    Social History Main Topics  . Smoking status: Former Smoker    Packs/day: 0.25    Years: 38.00    Types: Cigarettes  . Smokeless tobacco: Never Used     Comment: 2 cigs weekly  . Alcohol use 3.0 - 3.6 oz/week    5 - 6 Cans of beer per week     Comment: 2 -3days week goes to bar to have "a couple of beers so he can sleep"  . Drug use: Yes    Types: Marijuana     Comment: last week  . Sexual activity: Not on file   Other Topics Concern  . Not on file   Social History Narrative   Lives alone.    Family History  Problem Relation Age of Onset  . Diabetes Father   . Hypertension Father   . Heart failure Father     ROS- All systems are reviewed and negative except as per the HPI above  Physical Exam: Vitals:   10/15/16 1505  BP: 116/82  Pulse: (!) 106  Weight: (!) 308 lb 12.8 oz (140.1 kg)  Height: 6' 2.5" (1.892 m)   Wt Readings from Last 3 Encounters:  10/15/16 (!) 308 lb 12.8 oz (140.1 kg)  10/01/16 (!) 315 lb (142.9 kg)  09/02/16 (!) 316 lb (143.3 kg)    Labs: Lab Results  Component Value Date   NA 135 08/04/2016   K 4.5 08/04/2016   CL 105 08/04/2016   CO2 22 08/04/2016   GLUCOSE 219 (H) 08/04/2016   BUN 16 08/04/2016   CREATININE 0.89 08/04/2016    CALCIUM 9.0 08/04/2016   MG 2.1 08/04/2016   Lab Results  Component Value Date   INR 1.04 08/04/2016   Lab Results  Component Value Date   CHOL  04/10/2010    192        ATP III CLASSIFICATION:  <200     mg/dL   Desirable  200-239  mg/dL   Borderline High  >=240    mg/dL   High  HDL 50 04/10/2010   LDLCALC (H) 04/10/2010    110        Total Cholesterol/HDL:CHD Risk Coronary Heart Disease Risk Table                     Men   Women  1/2 Average Risk   3.4   3.3  Average Risk       5.0   4.4  2 X Average Risk   9.6   7.1  3 X Average Risk  23.4   11.0        Use the calculated Patient Ratio above and the CHD Risk Table to determine the patient's CHD Risk.        ATP III CLASSIFICATION (LDL):  <100     mg/dL   Optimal  100-129  mg/dL   Near or Above                    Optimal  130-159  mg/dL   Borderline  160-189  mg/dL   High  >190     mg/dL   Very High   TRIG 161 (H) 04/10/2010     GEN- The patient is well appearing, alert and oriented x 3 today.   Head- normocephalic, atraumatic Eyes-  Sclera clear, conjunctiva pink Ears- hearing intact Oropharynx- clear Neck- supple, no JVP Lymph- no cervical lymphadenopathy Lungs- Clear to ausculation bilaterally, normal work of breathing Heart- Regular rate and rhythm, no murmurs, rubs or gallops, PMI not laterally displaced GI- soft, NT, ND, + BS Extremities- no clubbing, cyanosis, or edema MS- no significant deformity or atrophy Skin- no rash or lesion Psych- euthymic mood, full affect Neuro- strength and sensation are intact  EKG- Sinus tach at 106 bpm, pr int 162 ms, qrs int 90 ms, qtc 435 ms Epic records reviewed Echo-Study Conclusions  - Left ventricle: The cavity size was mildly dilated. Wall   thickness was increased in a pattern of mild LVH. Systolic   function was normal. The estimated ejection fraction was in the   range of 50% to 55%. Wall motion was normal; there were no   regional wall motion  abnormalities. Doppler parameters are   consistent with abnormal left ventricular relaxation (grade 1   diastolic dysfunction). - Aortic valve: Transvalvular velocity was within the normal range.   There was no stenosis. There was no regurgitation. - Mitral valve: Transvalvular velocity was within the normal range.   There was no evidence for stenosis. There was trivial   regurgitation. - Left atrium: The atrium was moderately dilated. - Right ventricle: The cavity size was normal. Wall thickness was   normal. Systolic function was normal. - Tricuspid valve: There was no regurgitation.    Assessment and Plan: 1. Paroxysmal afib, new onset Afib has been quiet Continue eliquis 5 mg bid for chadsvasc score of 2 General bleeding precautions discussed Continue lopressor 50 mg bid  2. Lifestyle issues Encouraged whatever exercise pt could do without aggravating foot problems Limit/stop alcohol and excessive caffeine Snoring history and sleep study ordered Weight loss of 12 lbs since I started seeing him and encouraged to continue  Will refer to general cardiology for f/u afib and other risk factors for coronary disease Pt is asking for Dr. Claiborne Billings as he was his father's MD Establish in 3 months  Geroge Baseman. [place], [facility] [address] [place], [place] [address] [phone]

## 2016-10-22 ENCOUNTER — Ambulatory Visit (INDEPENDENT_AMBULATORY_CARE_PROVIDER_SITE_OTHER): Payer: Self-pay | Admitting: Orthopedic Surgery

## 2016-10-30 ENCOUNTER — Encounter: Payer: Self-pay | Admitting: Family Medicine

## 2016-10-30 ENCOUNTER — Ambulatory Visit: Payer: Self-pay | Attending: Family Medicine | Admitting: Family Medicine

## 2016-10-30 VITALS — BP 135/81 | HR 100 | Temp 98.2°F | Resp 20 | Ht 74.5 in | Wt 311.4 lb

## 2016-10-30 DIAGNOSIS — Z9889 Other specified postprocedural states: Secondary | ICD-10-CM | POA: Insufficient documentation

## 2016-10-30 DIAGNOSIS — I10 Essential (primary) hypertension: Secondary | ICD-10-CM | POA: Insufficient documentation

## 2016-10-30 DIAGNOSIS — Z96651 Presence of right artificial knee joint: Secondary | ICD-10-CM | POA: Insufficient documentation

## 2016-10-30 DIAGNOSIS — E1165 Type 2 diabetes mellitus with hyperglycemia: Secondary | ICD-10-CM | POA: Insufficient documentation

## 2016-10-30 DIAGNOSIS — M5431 Sciatica, right side: Secondary | ICD-10-CM | POA: Insufficient documentation

## 2016-10-30 DIAGNOSIS — Z794 Long term (current) use of insulin: Secondary | ICD-10-CM | POA: Insufficient documentation

## 2016-10-30 DIAGNOSIS — IMO0002 Reserved for concepts with insufficient information to code with codable children: Secondary | ICD-10-CM

## 2016-10-30 DIAGNOSIS — M5136 Other intervertebral disc degeneration, lumbar region: Secondary | ICD-10-CM | POA: Insufficient documentation

## 2016-10-30 DIAGNOSIS — E11610 Type 2 diabetes mellitus with diabetic neuropathic arthropathy: Secondary | ICD-10-CM | POA: Insufficient documentation

## 2016-10-30 DIAGNOSIS — Z89422 Acquired absence of other left toe(s): Secondary | ICD-10-CM | POA: Insufficient documentation

## 2016-10-30 DIAGNOSIS — E118 Type 2 diabetes mellitus with unspecified complications: Secondary | ICD-10-CM

## 2016-10-30 DIAGNOSIS — I480 Paroxysmal atrial fibrillation: Secondary | ICD-10-CM | POA: Insufficient documentation

## 2016-10-30 DIAGNOSIS — Z79899 Other long term (current) drug therapy: Secondary | ICD-10-CM | POA: Insufficient documentation

## 2016-10-30 MED ORDER — INSULIN PEN NEEDLE 31G X 8 MM MISC: 1.0000 | Freq: Three times a day (TID) | 5 refills | Status: DC

## 2016-10-30 MED ORDER — CYCLOBENZAPRINE HCL 10 MG PO TABS: 10.0000 mg | ORAL_TABLET | Freq: Two times a day (BID) | ORAL | 0 refills | Status: DC | PRN

## 2016-10-30 MED ORDER — LOSARTAN POTASSIUM 50 MG PO TABS: 50.0000 mg | ORAL_TABLET | Freq: Every day | ORAL | 5 refills | Status: DC

## 2016-10-30 MED ORDER — INSULIN GLARGINE 100 UNIT/ML SOLOSTAR PEN: 55.0000 [IU] | PEN_INJECTOR | Freq: Every morning | SUBCUTANEOUS | 3 refills | Status: DC

## 2016-10-30 NOTE — Progress Notes (Signed)
Subjective:  Patient ID: Jeffery [name], male    DOB: 11-Jan-1961  Age: 56 y.o. MRN: [id number]  CC: Diabetes and Hypertension   HPI Jeffery [name] is a 56 year old male with history of type 2 diabetes mellitus (A1c 9.4), hypertension, Charcot foot due to diabetes mellitus, newly diagnosed A. fib (currently on rate control with metoprolol and anticoagulation with Eliquis).  He has not been compliant with a diabetic diet and does not exercise regularly. He has been compliant with his medications he states. Denies numbness in extremities.  Charcots foot is being managed by orthopedics-Dr. Sharol Given and he has a boot which he wears as needed.  Seen by cardiology on 09/2016 for newly diagnosed atrial fibrillation and his upcoming appointment is in 2 months time. Denies chest pain, shortness of breath or palpitations. Denies bleeding or bruising.  He complains of right lower back pain which radiates to his right hip and his right thigh. Symptoms started 2-3 weeks ago when he attempted to lift a heavy object but has improved ever since. He is requesting a medication to help take the edge away. Pain is rated at 5/10 at this time.  Past Medical History:  Diagnosis Date  . Atrial fibrillation ([place])   . DDD (degenerative disc disease), lumbar   . Diabetes mellitus without complication ([place])   . Hypertension   . Rotator cuff disorder     Past Surgical History:  Procedure Laterality Date  . AMPUTATION Left 10/02/2014   Procedure: Left Third toe amputation ;  Surgeon: [name], MD;  Location: [place];  Service: Orthopedics;  Laterality: Left;  Regular bed, wants to follow hip  . ANTERIOR CRUCIATE LIGAMENT REPAIR Right 90   reconstruction  . APPLICATION OF WOUND VAC Left 10/02/2014   Procedure: APPLICATION OF WOUND VAC; toe Surgeon: [name], MD;  Location: [facility];  Service: Orthopedics;  Laterality: Left;  . I&D EXTREMITY Left 10/05/2014   Procedure: IRRIGATION AND DEBRIDEMENT LEFT FOOT;   Surgeon: [name], MD;  Location: [facility];  Service: Orthopedics;  Laterality: Left;  . KNEE ARTHROSCOPY W/ ACL RECONSTRUCTION Right   . TOTAL KNEE ARTHROPLASTY Right 03/28/2015  . TOTAL KNEE ARTHROPLASTY Right 03/28/2015   Procedure: RIGHT TOTAL KNEE ARTHROPLASTY;  Surgeon: [name], MD;  Location: [facility];  Service: Orthopedics;  Laterality: Right;    No Known Allergies   Outpatient Medications Prior to Visit  Medication Sig Dispense Refill  . apixaban (ELIQUIS) 5 MG TABS tablet Take 1 tablet (5 mg total) by mouth 2 (two) times daily. 60 tablet 3  . Blood Glucose Monitoring Suppl (TRUE METRIX METER) DEVI 1 each by Does not apply route 3 (three) times daily before meals. 1 Device 0  . gabapentin (NEURONTIN) 100 MG capsule Take 1 capsule (100 mg total) by mouth 3 (three) times daily. When necessary for neuropathy pain 90 capsule 3  . glucose blood (TRUE METRIX BLOOD GLUCOSE TEST) test strip Use 3 times daily before meals 100 each 12  . insulin aspart (NOVOLOG) 100 UNIT/ML injection Inject 6 Units into the skin 3 (three) times daily with meals. 30 mL 3  . insulin lispro (HUMALOG) 100 UNIT/ML injection Inject 0.06 mLs (6 Units total) into the skin 3 (three) times daily with meals. 30 mL 3  . Insulin Syringe-Needle U-100 (TRUEPLUS INSULIN SYRINGE) 30G X 5/16" 0.5 ML MISC Use as directed 3 times daily 100 each 5  . metoprolol tartrate (LOPRESSOR) 50 MG tablet Take 1 tablet (  50 mg total) by mouth 2 (two) times daily. 60 tablet 3  . traMADol (ULTRAM) 50 MG tablet TAKE ONE TABLET BY MOUTH EVERY 12 HOURS AS NEEDED 40 tablet 0  . TRUEPLUS INSULIN SYRINGE 30G X 5/16" 0.5 ML MISC USE AS DIRECTED 3 TIMES DAILY 100 each 0  . TRUEPLUS LANCETS 28G MISC 1 each by Does not apply route 3 (three) times daily before meals. 100 each 12  . cyclobenzaprine (FLEXERIL) 10 MG tablet Take 1 tablet (10 mg total) by mouth 2 (two) times daily as needed for muscle spasms. 20 tablet 0  . diclofenac (VOLTAREN) 75 MG EC  tablet Take 1 tablet (75 mg total) by mouth 2 (two) times daily. 30 tablet 0  . Insulin Glargine (LANTUS SOLOSTAR) 100 UNIT/ML Solostar Pen Inject 45 Units into the skin every morning. 5 pen 3  . Insulin Pen Needle (B-D ULTRAFINE III SHORT PEN) 31G X 8 MM MISC 1 each by Does not apply route 3 (three) times daily. 100 each 5  . losartan (COZAAR) 50 MG tablet TAKE 1 TABLET BY MOUTH DAILY 30 tablet 0   No facility-administered medications prior to visit.     ROS Review of Systems  Constitutional: Negative for activity change and appetite change.  HENT: Negative for sinus pressure and sore throat.   Eyes: Negative for visual disturbance.  Respiratory: Negative for cough, chest tightness and shortness of breath.   Cardiovascular: Negative for chest pain and leg swelling.  Gastrointestinal: Negative for abdominal distention, abdominal pain, constipation and diarrhea.  Endocrine: Negative.   Genitourinary: Negative for dysuria.  Musculoskeletal: Positive for back pain. Negative for joint swelling and myalgias.  Skin: Negative for rash.  Allergic/Immunologic: Negative.   Neurological: Negative for weakness, light-headedness and numbness.  Psychiatric/Behavioral: Negative for dysphoric mood and suicidal ideas.    Objective:  BP 135/81 (BP Location: Left Arm, Patient Position: Sitting, Cuff Size: Large)   Pulse 100   Temp 98.2 F (36.8 C) (Oral)   Resp 20   Ht 6' 2.5" (1.892 m)   Wt (!) 311 lb 6.4 oz (141.3 kg)   SpO2 96%   BMI 39.45 kg/m   BP/Weight 10/30/2016 4/68/0321 05/02/4823  Systolic BP 003 704 888  Diastolic BP 81 82 95  Wt. (Lbs) 311.4 308.8 315  BMI 39.45 39.12 39.9      Physical Exam Constitutional: He is oriented to person, place, and time. He appears well-developed and well-nourished.  Neck: Normal, no JVD Cardiovascular: Normal rate, normal heart sounds and intact distal pulses.   No murmur heard. Pulmonary/Chest: Effort normal and breath sounds normal. He has no  wheezes. He has no rales. He exhibits no tenderness.  Abdominal: Soft. Bowel sounds are normal. He exhibits no distension and no mass. There is no tenderness.  Musculoskeletal: He exhibits Slight edema right ankle, not tender to palpation, normal range of motion . No tenderness on palpation of lumbosacral spine. Negative straight leg raise bilaterally.  Neurological: He is alert and oriented to person, place, and time.  Psych: Normal    Assessment & Plan:   1. Essential hypertension Controlled - losartan (COZAAR) 50 MG tablet; Take 1 tablet (50 mg total) by mouth daily.  Dispense: 30 tablet; Refill: 5  2. Uncontrolled type 2 diabetes mellitus with complication, with long-term current use of insulin (HCC) Uncontrolled with A1c of 9.4 Increased dose of Lantus Diabetic diet - Insulin Glargine (LANTUS SOLOSTAR) 100 UNIT/ML Solostar Pen; Inject 55 Units into the skin every morning.  Dispense: 5 pen; Refill: 3 - Insulin Pen Needle (B-D ULTRAFINE III SHORT PEN) 31G X 8 MM MISC; 1 each by Does not apply route 3 (three) times daily.  Dispense: 100 each; Refill: 5 - CMP14+EGFR; Future - Lipid panel; Future - Microalbumin/Creatinine Ratio, Urine; Future  3. Charcot foot due to diabetes mellitus ([place]) Currently no acute flare Use boot as prescribed by orthopedics  4. Sciatica of right side Improving - cyclobenzaprine (FLEXERIL) 10 MG tablet; Take 1 tablet (10 mg total) by mouth 2 (two) times daily as needed for muscle spasms.  Dispense: 60 tablet; Refill: 0  5. Paroxysmal atrial fibrillation (HCC) CHADS2VASC score of 2 (hypertension and diabetes) Continue rate control with metoprolol and anticoagulation with Eliquis   Meds ordered this encounter  Medications  . losartan (COZAAR) 50 MG tablet    Sig: Take 1 tablet (50 mg total) by mouth daily.    Dispense:  30 tablet    Refill:  5    Must have office visit for refills  . cyclobenzaprine (FLEXERIL) 10 MG tablet    Sig: Take 1 tablet  (10 mg total) by mouth 2 (two) times daily as needed for muscle spasms.    Dispense:  60 tablet    Refill:  0  . Insulin Glargine (LANTUS SOLOSTAR) 100 UNIT/ML Solostar Pen    Sig: Inject 55 Units into the skin every morning.    Dispense:  5 pen    Refill:  3    Discontinue previous dose  . Insulin Pen Needle (B-D ULTRAFINE III SHORT PEN) 31G X 8 MM MISC    Sig: 1 each by Does not apply route 3 (three) times daily.    Dispense:  100 each    Refill:  5    Follow-up: Return in about 3 months (around 01/30/2017) for Follow-up of chronic medical conditions.    This note has been created with Surveyor, quantity. Any transcriptional errors are unintentional.     Arnoldo Morale MD

## 2016-10-30 NOTE — Patient Instructions (Signed)

## 2016-11-01 ENCOUNTER — Encounter (HOSPITAL_COMMUNITY): Payer: Self-pay | Admitting: *Deleted

## 2016-11-01 ENCOUNTER — Emergency Department (HOSPITAL_COMMUNITY)
Admission: EM | Admit: 2016-11-01 | Discharge: 2016-11-01 | Disposition: A | Discharge status: Home / Self Care | Payer: Self-pay | Attending: Emergency Medicine | Admitting: Emergency Medicine

## 2016-11-01 DIAGNOSIS — I4891 Unspecified atrial fibrillation: Secondary | ICD-10-CM | POA: Diagnosis not present

## 2016-11-01 DIAGNOSIS — E119 Type 2 diabetes mellitus without complications: Secondary | ICD-10-CM | POA: Insufficient documentation

## 2016-11-01 DIAGNOSIS — Z794 Long term (current) use of insulin: Secondary | ICD-10-CM | POA: Insufficient documentation

## 2016-11-01 DIAGNOSIS — Z79899 Other long term (current) drug therapy: Secondary | ICD-10-CM | POA: Insufficient documentation

## 2016-11-01 DIAGNOSIS — R000 Tachycardia, unspecified: Secondary | ICD-10-CM | POA: Diagnosis not present

## 2016-11-01 DIAGNOSIS — Z7901 Long term (current) use of anticoagulants: Secondary | ICD-10-CM | POA: Insufficient documentation

## 2016-11-01 DIAGNOSIS — Z87891 Personal history of nicotine dependence: Secondary | ICD-10-CM | POA: Insufficient documentation

## 2016-11-01 DIAGNOSIS — Z96651 Presence of right artificial knee joint: Secondary | ICD-10-CM | POA: Insufficient documentation

## 2016-11-01 DIAGNOSIS — I10 Essential (primary) hypertension: Secondary | ICD-10-CM | POA: Insufficient documentation

## 2016-11-01 HISTORY — DX: Unspecified atrial fibrillation: I48.91

## 2016-11-01 LAB — CBC
HCT: 45.6 % (ref 39.0–52.0)
Hemoglobin: 15.6 g/dL (ref 13.0–17.0)
MCH: 28.6 pg (ref 26.0–34.0)
MCHC: 34.2 g/dL (ref 30.0–36.0)
MCV: 83.5 fL (ref 78.0–100.0)
Platelets: 250 10*3/uL (ref 150–400)
RBC: 5.46 MIL/uL (ref 4.22–5.81)
RDW: 13.9 % (ref 11.5–15.5)
WBC: 11.2 10*3/uL — ABNORMAL HIGH (ref 4.0–10.5)

## 2016-11-01 LAB — BASIC METABOLIC PANEL
Anion gap: 7 (ref 5–15)
BUN: 13 mg/dL (ref 6–20)
CO2: 22 mmol/L (ref 22–32)
Calcium: 9.3 mg/dL (ref 8.9–10.3)
Chloride: 105 mmol/L (ref 101–111)
Creatinine, Ser: 0.85 mg/dL (ref 0.61–1.24)
GFR calc Af Amer: >60 mL/min (ref >60)
GFR calc non Af Amer: >60 mL/min (ref >60)
Glucose, Bld: 314 mg/dL — ABNORMAL HIGH (ref 65–99)
Potassium: 4.4 mmol/L (ref 3.5–5.1)
Sodium: 134 mmol/L — ABNORMAL LOW (ref 135–145)

## 2016-11-01 LAB — I-STAT TROPONIN, ED: Troponin i, poc: 0.02 ng/mL (ref 0.00–0.08)

## 2016-11-01 MED ORDER — PROPOFOL 10 MG/ML IV BOLUS
0.5000 mg/kg | Freq: Once | INTRAVENOUS | Status: AC
  Administered 2016-11-01: 70.7 mg via INTRAVENOUS
  Filled 2016-11-01: qty 20

## 2016-11-01 MED ORDER — KETAMINE HCL-SODIUM CHLORIDE 100-0.9 MG/10ML-% IV SOSY
1.0000 mg/kg | PREFILLED_SYRINGE | Freq: Once | INTRAVENOUS | Status: AC
  Administered 2016-11-01: 100 mg via INTRAVENOUS
  Filled 2016-11-01: qty 20

## 2016-11-01 MED ORDER — SODIUM CHLORIDE 0.9 % IV BOLUS (SEPSIS)
500.0000 mL | Freq: Once | INTRAVENOUS | Status: AC
  Administered 2016-11-01: 500 mL via INTRAVENOUS

## 2016-11-01 NOTE — Discharge Instructions (Signed)
Call the Atrial Fibrillation Clinic at [facility], phone [phone]. They can schedule appointment for you to be seen, this week for a checkup.  Take your Eliquis, beginning today, as directed.  Avoid all forms of alcohol.  For the next few days. Try to drink 1-2 L of water each day.

## 2016-11-01 NOTE — Sedation Documentation (Signed)
Shock delivered 100j

## 2016-11-01 NOTE — ED Provider Notes (Addendum)
[facility] DEPT Provider Note   CSN: [id number] Arrival date & time: 11/01/16  1700     History   Chief Complaint Chief Complaint  Patient presents with  . Atrial Fibrillation  . Shortness of Breath    HPI Jeffery [name] is a 56 y.o. male.  He presents for evaluation of palpitations, diaphoresis, near syncope, and general weakness, which started upon awakening this morning. He admits to "breaking the rules," by drinking alcohol. The last 2 evenings. Last night, he "drank 10 beers." He states he has not been taking his outlook with for the last week because of requiring ibuprofen for back pain. Otherwise he's been compliant with the [place] with. He denies concurrent fever, chills, cough, chest pain, paresthesia or focal weakness. He denies headache. Previously has had A. fib, requiring cardioversion, and treated with with with outpatient follow-up in the atrial fibrillation clinic. There are no other known modifying factors.  HPI  Past Medical History:  Diagnosis Date  . Atrial fibrillation ([place])   . DDD (degenerative disc disease), lumbar   . Diabetes mellitus without complication ([place])   . Hypertension   . Rotator cuff disorder     Patient Active Problem List   Diagnosis Date Noted  . Paroxysmal atrial fibrillation ([place]) 10/30/2016  . Pain in right ankle and joints of right foot 07/09/2016  . Diabetic polyneuropathy associated with type 2 diabetes mellitus ([place]) 07/09/2016  . Chronic venous hypertension (idiopathic) with ulcer and inflammation of right lower extremity ([place]) 07/09/2016  . Impingement syndrome of right shoulder 07/07/2016  . Right foot pain 07/07/2016  . Charcot's joint of right foot 07/03/2016  . Morbid obesity ([place]) 06/05/2016  . S/P total knee replacement using cement 03/28/2015  . Knee osteoarthritis 11/12/2014  . Hypertension 10/15/2014  . Cellulitis of left lower extremity   . Osteomyelitis of toe of left foot ([place])   . Toe osteomyelitis,  left ([place]) 10/01/2014  . Cellulitis of leg, left 10/01/2014  . Charcot foot due to diabetes mellitus ([place]) 10/01/2014  . Accelerated hypertension 10/01/2014  . Gangrene left third toe 10/01/2014  . SHOULDER JOINT INSTABILITY 05/27/2010  . SHOULDER PAIN, RIGHT 05/27/2010    Past Surgical History:  Procedure Laterality Date  . AMPUTATION Left 10/02/2014   Procedure: Left Third toe amputation ;  Surgeon: [name], MD;  Location: [facility];  Service: Orthopedics;  Laterality: Left;  Regular bed, wants to follow hip  . ANTERIOR CRUCIATE LIGAMENT REPAIR Right 90   reconstruction  . APPLICATION OF WOUND VAC Left 10/02/2014   Procedure: APPLICATION OF WOUND VAC; toe Surgeon: [name], MD;  Location: [facility];  Service: Orthopedics;  Laterality: Left;  . I&D EXTREMITY Left 10/05/2014   Procedure: IRRIGATION AND DEBRIDEMENT LEFT FOOT;  Surgeon: [name], MD;  Location: [facility];  Service: Orthopedics;  Laterality: Left;  . KNEE ARTHROSCOPY W/ ACL RECONSTRUCTION Right   . TOTAL KNEE ARTHROPLASTY Right 03/28/2015  . TOTAL KNEE ARTHROPLASTY Right 03/28/2015   Procedure: RIGHT TOTAL KNEE ARTHROPLASTY;  Surgeon: [name], MD;  Location: [facility];  Service: Orthopedics;  Laterality: Right;       Home Medications    Prior to Admission medications   Medication Sig Start Date End Date Taking? Authorizing Provider  apixaban (ELIQUIS) 5 MG TABS tablet Take 1 tablet (5 mg total) by mouth 2 (two) times daily. 09/04/16  Yes Sherran Needs, NP  Blood Glucose Monitoring Suppl (TRUE METRIX METER) DEVI 1 each by  Does not apply route 3 (three) times daily before meals. 06/19/16  Yes Arnoldo Morale, MD  cyclobenzaprine (FLEXERIL) 10 MG tablet Take 1 tablet (10 mg total) by mouth 2 (two) times daily as needed for muscle spasms. 10/30/16  Yes Arnoldo Morale, MD  gabapentin (NEURONTIN) 100 MG capsule Take 1 capsule (100 mg total) by mouth 3 (three) times daily. When necessary for neuropathy pain Patient taking  differently: Take 100 mg by mouth 3 (three) times daily as needed (nerve pain). When necessary for neuropathy pain 09/02/16  Yes Newt Minion, MD  glucose blood (TRUE METRIX BLOOD GLUCOSE TEST) test strip Use 3 times daily before meals 06/19/16  Yes Amao, Charlane Ferretti, MD  ibuprofen (ADVIL,MOTRIN) 200 MG tablet Take 200 mg by mouth every 6 (six) hours as needed for moderate pain.   Yes [provider]  Insulin Glargine (LANTUS SOLOSTAR) 100 UNIT/ML Solostar Pen Inject 55 Units into the skin every morning. 10/30/16  Yes Arnoldo Morale, MD  insulin lispro (HUMALOG) 100 UNIT/ML injection Inject 0.06 mLs (6 Units total) into the skin 3 (three) times daily with meals. Patient taking differently: Inject 7 Units into the skin 3 (three) times daily with meals.  04/24/16  Yes Jegede, Olugbemiga E, MD  Insulin Pen Needle (B-D ULTRAFINE III SHORT PEN) 31G X 8 MM MISC 1 each by Does not apply route 3 (three) times daily. 10/30/16  Yes Arnoldo Morale, MD  Insulin Syringe-Needle U-100 (TRUEPLUS INSULIN SYRINGE) 30G X 5/16" 0.5 ML MISC Use as directed 3 times daily 12/14/14  Yes Amao, Enobong, MD  losartan (COZAAR) 50 MG tablet Take 1 tablet (50 mg total) by mouth daily. 10/30/16  Yes Arnoldo Morale, MD  metoprolol tartrate (LOPRESSOR) 50 MG tablet Take 1 tablet (50 mg total) by mouth 2 (two) times daily. 09/04/16  Yes Sherran Needs, NP  TRUEPLUS INSULIN SYRINGE 30G X 5/16" 0.5 ML MISC USE AS DIRECTED 3 TIMES DAILY 04/03/16  Yes Jegede, Marlena Clipper, MD  TRUEPLUS LANCETS 28G MISC 1 each by Does not apply route 3 (three) times daily before meals. 06/19/16  Yes Arnoldo Morale, MD  insulin aspart (NOVOLOG) 100 UNIT/ML injection Inject 6 Units into the skin 3 (three) times daily with meals. Patient not taking: Reported on 11/01/2016 06/05/16   Arnoldo Morale, MD  traMADol (ULTRAM) 50 MG tablet TAKE ONE TABLET BY MOUTH EVERY 12 HOURS AS NEEDED Patient not taking: Reported on 11/01/2016 09/02/16   Arnoldo Morale, MD    Family  History Family History  Problem Relation Age of Onset  . Diabetes Father   . Hypertension Father   . Heart failure Father     Social History Social History  Substance Use Topics  . Smoking status: Former Smoker    Packs/day: 0.25    Years: 38.00    Types: Cigarettes  . Smokeless tobacco: Never Used     Comment: 2 cigs weekly  . Alcohol use 3.0 - 3.6 oz/week    5 - 6 Cans of beer per week     Comment: 2 -3days week goes to bar to have "a couple of beers so he can sleep"     Allergies   Patient has no known allergies.   Review of Systems Review of Systems  All other systems reviewed and are negative.    Physical Exam Updated Vital Signs BP (!) 136/100 (BP Location: Right Arm)   Pulse 97   Temp 98.1 F (36.7 C) (Oral)   Resp 20   SpO2  98%   Physical Exam  Constitutional: He is oriented to person, place, and time. He appears well-developed.  Obese  HENT:  Head: Normocephalic and atraumatic.  Right Ear: External ear normal.  Left Ear: External ear normal.  Eyes: Pupils are equal, round, and reactive to light. Conjunctivae and EOM are normal.  Neck: Normal range of motion and phonation normal. Neck supple.  Cardiovascular: Normal heart sounds.   Tachycardia with irregular rhythm  Pulmonary/Chest: Effort normal and breath sounds normal. No respiratory distress. He exhibits no bony tenderness.  Abdominal: Soft. There is no tenderness.  Musculoskeletal: Normal range of motion. He exhibits edema (1+ lower legs bilaterally).  Neurological: He is alert and oriented to person, place, and time. No cranial nerve deficit or sensory deficit. He exhibits normal muscle tone. Coordination normal.  Skin: Skin is warm, dry and intact.  Psychiatric: He has a normal mood and affect. His behavior is normal. Judgment and thought content normal.  Nursing note and vitals reviewed.    ED Treatments / Results  Labs (all labs ordered are listed, but only abnormal results are  displayed) Labs Reviewed  BASIC METABOLIC PANEL - Abnormal; Notable for the following:       Result Value   Sodium 134 (*)    Glucose, Bld 314 (*)    All other components within normal limits  CBC - Abnormal; Notable for the following:    WBC 11.2 (*)    All other components within normal limits  I-STAT TROPONIN, ED    EKG  EKG Interpretation  Date/Time:  Sunday November 01 2016 07:38:49 EDT Ventricular Rate:  154 PR Interval:    QRS Duration: 96 QT Interval:  244 QTC Calculation: 390 R Axis:   15 Text Interpretation:  Atrial fibrillation with rapid ventricular response Abnormal QRS-T angle, consider primary T wave abnormality Abnormal ECG Since last tracing Atrial fibrillation with rapid ventricular response is new Confirmed by Daleen Bo [phone]) on 11/01/2016 7:45:02 AM       EKG Interpretation  Date/Time:  Sunday November 01 2016 07:39:26 EDT Ventricular Rate:  169 PR Interval:    QRS Duration: 92 QT Interval:  274 QTC Calculation: 459 R Axis:   15 Text Interpretation:  Atrial fibrillation with rapid ventricular response Abnormal ECG Since last tracing of earlier today No significant change was found Confirmed by Daleen Bo [phone]) on 11/01/2016 7:45:54 AM       EKG Interpretation  Date/Time:  Sunday November 01 2016 09:27:50 EDT Ventricular Rate:  107 PR Interval:    QRS Duration: 101 QT Interval:  302 QTC Calculation: 403 R Axis:   20 Text Interpretation:  Sinus tachycardia Since last tracing of earlier today now in sinus rhythym with decreased rate Confirmed by Daleen Bo [phone]) on 11/01/2016 9:38:29 AM      CHA2DS2/VAS Stroke Risk Points      2 >= 2 Points: High Risk  1 - 1.99 Points: Medium Risk  0 Points: Low Risk    The patient's score has not changed in the past year.:  No Change         Details    Note: External data might be a factor in metrics not marked with    Points Metrics   This score determines the patient's risk of having a stroke  if the  patient has atrial fibrillation.       0 Has Congestive Heart Failure:  No   0 Has Vascular Disease:  No   1 Has  Hypertension:  Yes   0 Age:  63   1 Has Diabetes:  Yes   0 Had Stroke:  No Had TIA:  No Had thromboembolism:  No   0 Male:  No         Radiology No results found.  Procedures .Cardioversion Date/Time: 11/01/2016 9:33 AM Performed by: Daleen Bo Authorized by: Daleen Bo   Consent:    Consent obtained:  Written   Consent given by:  Patient   Risks discussed:  Induced arrhythmia and pain   Alternatives discussed:  Rate-control medication Pre-procedure details:    Cardioversion basis:  Elective   Rhythm:  Atrial fibrillation Attempt one:    Cardioversion mode:  Synchronous   Waveform:  Biphasic   Shock (Joules):  100   Shock outcome:  No change in rhythm Attempt two:    Cardioversion mode:  Synchronous   Waveform:  Biphasic   Shock (Joules):  120   Shock outcome:  Conversion to normal sinus rhythm Post-procedure details:    Patient status:  Alert   Patient tolerance of procedure:  Tolerated well, no immediate complications .Sedation Date/Time: 11/01/2016 9:17 AM Performed by: Daleen Bo Authorized by: Daleen Bo   Consent:    Consent obtained:  Written   Consent given by:  Patient   Risks discussed:  Allergic reaction, inadequate sedation, nausea and vomiting Indications:    Procedure performed:  Cardioversion   Procedure necessitating sedation performed by:  Physician performing sedation   Intended level of sedation:  Deep Pre-sedation assessment:    Time since last food or drink:  4 hours   ASA classification: class 2 - patient with mild systemic disease     Neck mobility: normal     Mouth opening:  2 finger widths   Mallampati score:  II - soft palate, uvula, fauces visible   Pre-sedation assessments completed and reviewed: airway patency, cardiovascular function, hydration status and mental status     Pre-sedation  assessments completed and reviewed: nausea/vomiting not reviewed and pain level not reviewed     History of difficult intubation: no     Pre-sedation assessment completed:  11/01/2016 9:00 AM Immediate pre-procedure details:    Reassessment: Patient reassessed immediately prior to procedure     Reviewed: vital signs and NPO status     Verified: bag valve mask available, emergency equipment available, intubation equipment available, IV patency confirmed and oxygen available   Procedure details (see MAR for exact dosages):    Sedation start time:  11/01/2016 9:17 AM   Sedation:  Ketamine (and propofol)   Intra-procedure monitoring:  Continuous capnometry, cardiac monitor and continuous pulse oximetry   Intra-procedure events: none     Sedation end time:  11/01/2016 9:33 AM Post-procedure details:    Attendance: Constant attendance by certified staff until patient recovered     Recovery: Patient returned to pre-procedure baseline     Post-sedation assessments completed and reviewed: airway patency, cardiovascular function and mental status     Post-sedation assessments completed and reviewed: nausea/vomiting not reviewed and pain level not reviewed     Specimens recovered:  None   Patient is stable for discharge or admission: yes     Patient tolerance:  Tolerated well, no immediate complications   (including critical care time)  Medications Ordered in ED Medications  sodium chloride 0.9 % bolus 500 mL (0 mLs Intravenous Stopped 11/01/16 0936)  propofol (DIPRIVAN) 10 mg/mL bolus/IV push 70.7 mg (70.7 mg Intravenous Given 11/01/16 0920)  ketamine 100  mg in normal saline 10 mL (10mg /mL) syringe (100 mg Intravenous Given 11/01/16 0920)     Initial Impression / Assessment and Plan / ED Course  I have reviewed the triage vital signs and the nursing notes.  Pertinent labs & imaging results that were available during my care of the patient were reviewed by me and considered in my medical decision making  (see chart for details).  Clinical Course as of Nov 02 1046  Nancy Fetter Nov 01, 2016  5809 The patient has new-onset rapid atrial fibrillation, with prior similar. He has been off  Eliquis for about a week, however, symptoms onset, this morning, following heavy drinking last night. He is at low risk for intracardiac thrombus, therefore, he can be urgently cardioverted, safely.  [EW]    Clinical Course User Index [EW] Daleen Bo, MD     Patient Vitals for the past 24 hrs:  BP Temp Temp src Pulse Resp SpO2  11/01/16 1044 (!) 136/100 - - 97 20 98 %  11/01/16 0945 (!) 136/92 - - 96 (!) 21 100 %  11/01/16 0933 - - - (!) 115 12 100 %  11/01/16 0930 (!) 165/101 - - (!) 118 11 100 %  11/01/16 0929 - - - (!) 121 12 100 %  11/01/16 0928 (!) 169/109 - - (!) 113 12 100 %  11/01/16 0927 - - - (!) 108 13 100 %  11/01/16 0915 (!) 142/88 - - (!) 47 12 100 %  11/01/16 0900 (!) 147/92 - - 77 19 100 %  11/01/16 0800 120/75 - - 77 15 100 %  11/01/16 0741 (!) 128/97 98.1 F (36.7 C) Oral (!) 162 20 98 %    10:43 AM Reevaluation with update and discussion. After initial assessment and treatment, an updated evaluation reveals Patient continues to be an sinus rhythm, rate 100-101/m. He has mild persistent hypertension. Findings discussed with patient and his mother, all questions were answered. Naia Ruff L    CRITICAL CARE Performed by: Daleen Bo L Total critical care time: 5 minutes Critical care time was exclusive of separately billable procedures and treating other patients. Critical care was necessary to treat or prevent imminent or life-threatening deterioration. Critical care was time spent personally by me on the following activities: development of treatment plan with patient and/or surrogate as well as nursing, discussions with consultants, evaluation of patient's response to treatment, examination of patient, obtaining history from patient or surrogate, ordering and performing treatments  and interventions, ordering and review of laboratory studies, ordering and review of radiographic studies, pulse oximetry and re-evaluation of patient's condition.   Final Clinical Impressions(s) / ED Diagnoses   Final diagnoses:  Atrial fibrillation with RVR (HCC)    Recurrent atrial fibrillation, with RVR, short duration, cardioverted in the emergency department. Patient has L cross, at home to take twice a day, as directed. He is encouraged to avoid ibuprofen, use Tylenol for back pain, and try heat on the discomfort. He has an upcoming appointment with cardiology in [place] to follow-up with the atrial fibrillation clinic, this week.  Nursing Notes Reviewed/ Care Coordinated Applicable Imaging Reviewed Interpretation of Laboratory Data incorporated into ED treatment  The patient appears reasonably screened and/or stabilized for discharge and I doubt any other medical condition or other [facility] requiring further screening, evaluation, or treatment in the ED at this time prior to discharge.  Plan: Home Medications- continue usual medicines including anticoagulant; Home Treatments- rest, increase oral fluids for several days. Avoid alcohol; return here if  the recommended treatment, does not improve the symptoms; Recommended follow up- PCP 1 week and as needed. Atrial fibrillation clinic this week and cardiology as scheduled  New Prescriptions New Prescriptions   No medications on file     Daleen Bo, MD 11/01/16 [place], [place], MD 11/02/16 [phone]

## 2016-11-01 NOTE — ED Notes (Signed)
Pt A/O x4, ambulatory, verbalizes understanding of d/c instructions, f/up care, and meds. Pt leaves with mother and has all belongings upon departure.

## 2016-11-01 NOTE — Sedation Documentation (Signed)
Shock delivered 120j

## 2016-11-01 NOTE — ED Triage Notes (Signed)
Pt reports hx of afib. Woke up this am with palpitations, sob and diaphoresis. EKG done at triage. Afib RVR rate 150-180 noted at triage.

## 2016-11-02 MED FILL — ?CYCLOBENZAPRINE 10 MG TABL: 10 | 30 days supply | Qty: 60 | Fill #0

## 2016-11-02 MED FILL — LOSARTAN POTASSIUM 50 MG TA: 50 | 30 days supply | Qty: 30 | Fill #0

## 2016-11-02 MED FILL — $LANTUS SOLOSTAR 100 UNITS/: 100 | 27 days supply | Qty: 15 | Fill #0

## 2016-11-04 ENCOUNTER — Ambulatory Visit (INDEPENDENT_AMBULATORY_CARE_PROVIDER_SITE_OTHER): Payer: Self-pay | Admitting: Family

## 2016-11-04 ENCOUNTER — Ambulatory Visit: Payer: Self-pay | Attending: Internal Medicine

## 2016-11-04 ENCOUNTER — Ambulatory Visit (HOSPITAL_COMMUNITY)
Admission: RE | Admit: 2016-11-04 | Discharge: 2016-11-04 | Disposition: A | Discharge status: Home / Self Care | Payer: Self-pay | Source: Ambulatory Visit | Attending: Nurse Practitioner | Admitting: Nurse Practitioner

## 2016-11-04 ENCOUNTER — Encounter (INDEPENDENT_AMBULATORY_CARE_PROVIDER_SITE_OTHER): Payer: Self-pay | Admitting: Orthopedic Surgery

## 2016-11-04 ENCOUNTER — Encounter (HOSPITAL_COMMUNITY): Payer: Self-pay | Admitting: Nurse Practitioner

## 2016-11-04 VITALS — Ht 74.0 in | Wt 311.0 lb

## 2016-11-04 VITALS — BP 146/82 | HR 79 | Ht 74.0 in | Wt 314.2 lb

## 2016-11-04 DIAGNOSIS — M545 Low back pain, unspecified: Secondary | ICD-10-CM

## 2016-11-04 DIAGNOSIS — M14671 Charcot's joint, right ankle and foot: Secondary | ICD-10-CM

## 2016-11-04 DIAGNOSIS — M79671 Pain in right foot: Secondary | ICD-10-CM

## 2016-11-04 DIAGNOSIS — I491 Atrial premature depolarization: Secondary | ICD-10-CM | POA: Insufficient documentation

## 2016-11-04 DIAGNOSIS — Z96651 Presence of right artificial knee joint: Secondary | ICD-10-CM | POA: Insufficient documentation

## 2016-11-04 DIAGNOSIS — E119 Type 2 diabetes mellitus without complications: Secondary | ICD-10-CM | POA: Insufficient documentation

## 2016-11-04 DIAGNOSIS — I10 Essential (primary) hypertension: Secondary | ICD-10-CM | POA: Insufficient documentation

## 2016-11-04 DIAGNOSIS — Z79899 Other long term (current) drug therapy: Secondary | ICD-10-CM | POA: Insufficient documentation

## 2016-11-04 DIAGNOSIS — Z794 Long term (current) use of insulin: Secondary | ICD-10-CM | POA: Insufficient documentation

## 2016-11-04 DIAGNOSIS — Z8249 Family history of ischemic heart disease and other diseases of the circulatory system: Secondary | ICD-10-CM | POA: Insufficient documentation

## 2016-11-04 DIAGNOSIS — Z9889 Other specified postprocedural states: Secondary | ICD-10-CM | POA: Insufficient documentation

## 2016-11-04 DIAGNOSIS — I480 Paroxysmal atrial fibrillation: Secondary | ICD-10-CM | POA: Insufficient documentation

## 2016-11-04 DIAGNOSIS — Z833 Family history of diabetes mellitus: Secondary | ICD-10-CM | POA: Insufficient documentation

## 2016-11-04 DIAGNOSIS — Z7901 Long term (current) use of anticoagulants: Secondary | ICD-10-CM | POA: Insufficient documentation

## 2016-11-04 DIAGNOSIS — IMO0002 Reserved for concepts with insufficient information to code with codable children: Secondary | ICD-10-CM

## 2016-11-04 DIAGNOSIS — G8929 Other chronic pain: Secondary | ICD-10-CM

## 2016-11-04 DIAGNOSIS — E118 Type 2 diabetes mellitus with unspecified complications: Principal | ICD-10-CM

## 2016-11-04 DIAGNOSIS — E1165 Type 2 diabetes mellitus with hyperglycemia: Secondary | ICD-10-CM

## 2016-11-04 DIAGNOSIS — Z89422 Acquired absence of other left toe(s): Secondary | ICD-10-CM | POA: Insufficient documentation

## 2016-11-04 DIAGNOSIS — Z87891 Personal history of nicotine dependence: Secondary | ICD-10-CM | POA: Insufficient documentation

## 2016-11-04 DIAGNOSIS — M5136 Other intervertebral disc degeneration, lumbar region: Secondary | ICD-10-CM | POA: Insufficient documentation

## 2016-11-05 ENCOUNTER — Other Ambulatory Visit: Payer: Self-pay | Admitting: Family Medicine

## 2016-11-05 LAB — CMP14+EGFR
ALT: 22 IU/L (ref 0–44)
AST: 15 IU/L (ref 0–40)
Albumin/Globulin Ratio: 1.3 (ref 1.2–2.2)
Albumin: 4 g/dL (ref 3.5–5.5)
Alkaline Phosphatase: 79 IU/L (ref 39–117)
BUN/Creatinine Ratio: 15 (ref 9–20)
BUN: 12 mg/dL (ref 6–24)
Bilirubin Total: 0.4 mg/dL (ref 0.0–1.2)
CO2: 22 mmol/L (ref 20–29)
Calcium: 9.6 mg/dL (ref 8.7–10.2)
Chloride: 103 mmol/L (ref 96–106)
Creatinine, Ser: 0.8 mg/dL (ref 0.76–1.27)
GFR calc Af Amer: 117 mL/min/{1.73_m2} (ref >59)
GFR calc non Af Amer: 101 mL/min/{1.73_m2} (ref >59)
Globulin, Total: 3.2 g/dL (ref 1.5–4.5)
Glucose: 152 mg/dL — ABNORMAL HIGH (ref 65–99)
Potassium: 5.1 mmol/L (ref 3.5–5.2)
Sodium: 140 mmol/L (ref 134–144)
Total Protein: 7.2 g/dL (ref 6.0–8.5)

## 2016-11-05 LAB — LIPID PANEL
Chol/HDL Ratio: 4.5 ratio (ref 0.0–5.0)
Cholesterol, Total: 191 mg/dL (ref 100–199)
HDL: 42 mg/dL (ref >39)
LDL Calculated: 108 mg/dL — ABNORMAL HIGH (ref 0–99)
Triglycerides: 205 mg/dL — ABNORMAL HIGH (ref 0–149)
VLDL Cholesterol Cal: 41 mg/dL — ABNORMAL HIGH (ref 5–40)

## 2016-11-05 LAB — MICROALBUMIN / CREATININE URINE RATIO
Creatinine, Urine: 59.3 mg/dL
Microalb/Creat Ratio: 21.4 mg/g creat (ref 0.0–30.0)
Microalbumin, Urine: 12.7 ug/mL

## 2016-11-05 MED ORDER — ATORVASTATIN CALCIUM 20 MG PO TABS: 20.0000 mg | ORAL_TABLET | Freq: Every day | ORAL | 3 refills | Status: DC

## 2016-11-05 NOTE — Progress Notes (Signed)
Office Visit Note   Patient: Jeffery [name]           Date of Birth: 02/04/1961           MRN: [id number] Visit Date: 11/04/2016              Requested by: Arnoldo Morale, MD [place], [place] [address] PCP: Arnoldo Morale, MD  Chief Complaint  Patient presents with  . Right Foot - Follow-up  . Lower Back - Follow-up      HPI: Patient is a 56 year old gentleman who present today in follow up for 2 separate issues.   Seen in follow up for Charcot collapse right foot. Has resumed regular shoe wear with orthotics. Has been doing quite well. Is pleased with his progress. States will buying a second pair of orthotics.   Also seen in follow up for midline low back pain. This is acute on chronic. Did help a friend retile a floor in July, was doing repetitive lifting and bending. Patient denies any radicular pain that we states he has had radicular symptoms in the past. today his pain at this time is primarily focused in the lumbar spine. Has been taking Flexeril which has been somewhat helpful. Did stop his eliquis for several days so he could take ibuprofen which provides him relief. However while off the Eliquis did have a visit to ED over this weekend for Afib. Is now back on Eliquis. Trialed Prednisone as well over last few weeks as prescribed by Dr. Sharol Given without relief.   MRI for 2002 did show disc bulge. States has had ESI before with relief. Historical mri as below: L5-S1 DIFFUSE, BROAD BASED, DISK HERNIATION PROMINENT CENTRALLY AND LEFT POSTEROLATERALLY WITH ENCROACHMENT AND POSSIBLE CONTACT WITH THE L5 NERVE ROOT. 1.0 CM SOFT TISSUE MASS IN THE RIGHT PARAMEDIAN EPIDURAL SPACE WHICH IS HIGHLY SUSPICIOUS EXTRUDED DISK MATERIAL. L4-5 DIFFUSE, BROAD BASED, SHALLOW HERNIATION PROMINENT CENTRALLY WITH ACQUIRED MODERATE SPINAL CANAL STENOSIS. L3-4 DIFFUSE, BROAD BASED, ANNULAR DISK BULGE WITH CENTRAL PROMINENCE AND PROTRUSION RIGHT POSTEROLATERALLY WITH ENCROACHMENT  ON THE L3 NERVE ROOT.  Assessment & Plan: Visit Diagnoses:  1. Charcot's joint of right foot   2. Right foot pain     Plan: No NSAIDs. Stay on your Eliquis. Will send to Dr. Ernestina Patches for evaluation.   Continue your orthotics and follow up in office as needed for right foot.   Follow-Up Instructions: No Follow-up on file.   Ortho Exam  Patient is alert, oriented, no adenopathy, well-dressed, normal affect, normal respiratory effort.Patient has an antalgic gait. Examination patient has a negative straight leg raise bilaterally he has no focal motor weakness in either lower extremity no signs of sciatic tension no signs of motor deficit. His right foot has a good pulse. He has no pronation and valgus to the forefoot he does have a decreased arch.  Imaging: No results found.  Labs: Lab Results  Component Value Date   HGBA1C 10.3 06/05/2016   HGBA1C 11.1 05/14/2016   HGBA1C 11.4 10/28/2015   ESRSEDRATE 1 03/18/2015   CRP 0.6 03/18/2015   LABURIC 4.8 07/09/2016   LABURIC 3.5 (L) 05/14/2016   REPTSTATUS 10/06/2014 FINAL 10/01/2014   GRAMSTAIN  10/01/2014    ABUNDANT WBC PRESENT, PREDOMINANTLY PMN ABUNDANT GRAM POSITIVE COCCI IN CLUSTERS FEW SQUAMOUS EPITHELIAL CELLS PRESENT Gram Stain Report Called to,Read Back By and Verified With: Vanessa Barbara [phone] @ [facility]  10/01/2014  RARE WBC PRESENT, PREDOMINANTLY PMN NO SQUAMOUS EPITHELIAL CELLS SEEN ABUNDANT GRAM POSITIVE COCCI IN PAIRS IN CLUSTERS Performed at [facility]  10/01/2014    NO GROWTH 5 DAYS Performed at [facility] 10/01/2014    Orders:  No orders of the defined types were placed in this encounter.  No orders of the defined types were placed in this encounter.    Procedures: No procedures performed  Clinical Data: No additional findings.  ROS:  All other systems negative, except as noted in the HPI. Review of  Systems  Objective: Vital Signs: Ht 6\' 2"  (1.88 m)   Wt (!) 311 lb (141.1 kg)   BMI 39.93 kg/m   Specialty Comments:  No specialty comments available.  PMFS History: Patient Active Problem List   Diagnosis Date Noted  . Paroxysmal atrial fibrillation ([place]) 10/30/2016  . Pain in right ankle and joints of right foot 07/09/2016  . Diabetic polyneuropathy associated with type 2 diabetes mellitus ([place]) 07/09/2016  . Chronic venous hypertension (idiopathic) with ulcer and inflammation of right lower extremity ([place]) 07/09/2016  . Impingement syndrome of right shoulder 07/07/2016  . Right foot pain 07/07/2016  . Charcot's joint of right foot 07/03/2016  . Morbid obesity ([place]) 06/05/2016  . S/P total knee replacement using cement 03/28/2015  . Knee osteoarthritis 11/12/2014  . Hypertension 10/15/2014  . Osteomyelitis of toe of left foot ([place])   . Toe osteomyelitis, left ([place]) 10/01/2014  . Charcot foot due to diabetes mellitus ([place]) 10/01/2014  . Accelerated hypertension 10/01/2014  . Gangrene left third toe 10/01/2014  . SHOULDER JOINT INSTABILITY 05/27/2010  . SHOULDER PAIN, RIGHT 05/27/2010   Past Medical History:  Diagnosis Date  . Atrial fibrillation ([place])   . DDD (degenerative disc disease), lumbar   . Diabetes mellitus without complication ([place])   . Hypertension   . Rotator cuff disorder     Family History  Problem Relation Age of Onset  . Diabetes Father   . Hypertension Father   . Heart failure Father     Past Surgical History:  Procedure Laterality Date  . AMPUTATION Left 10/02/2014   Procedure: Left Third toe amputation ;  Surgeon: [name], MD;  Location: [facility];  Service: Orthopedics;  Laterality: Left;  Regular bed, wants to follow hip  . ANTERIOR CRUCIATE LIGAMENT REPAIR Right 90   reconstruction  . APPLICATION OF WOUND VAC Left 10/02/2014   Procedure: APPLICATION OF WOUND VAC; toe Surgeon: [name], MD;  Location: [facility];  Service: Orthopedics;   Laterality: Left;  . I&D EXTREMITY Left 10/05/2014   Procedure: IRRIGATION AND DEBRIDEMENT LEFT FOOT;  Surgeon: [name], MD;  Location: [facility];  Service: Orthopedics;  Laterality: Left;  . KNEE ARTHROSCOPY W/ ACL RECONSTRUCTION Right   . TOTAL KNEE ARTHROPLASTY Right 03/28/2015  . TOTAL KNEE ARTHROPLASTY Right 03/28/2015   Procedure: RIGHT TOTAL KNEE ARTHROPLASTY;  Surgeon: [name], MD;  Location: [facility];  Service: Orthopedics;  Laterality: Right;   Social History   Occupational History  . Landscaping & rental handyman    Social History Main Topics  . Smoking status: Former Smoker    Packs/day: 0.25    Years: 38.00    Types: Cigarettes  . Smokeless tobacco: Never Used     Comment: 2 cigs weekly  . Alcohol use 3.0 - 3.6 oz/week    5 - 6 Cans of beer per week  Comment: 2 -3days week goes to bar to have "a couple of beers so he can sleep"  . Drug use: Yes    Types: Marijuana     Comment: last week  . Sexual activity: Not on file

## 2016-11-05 NOTE — Progress Notes (Signed)
Primary Care Physician: Arnoldo Morale, MD Referring Physician:F/U ER   Jeffery [name] is a 56 y.o. male with a h/o morbid obesity,DM,HTN, that was seen in the  ER after he was drinking beer in a bar the night before. The next morning he awoke feeling weak. He presented in new onset AFIb and underwent successful cardioversion. No further afib. He was placed on lopressor and eliquis 5 mg bid for a chadsvasc score of 2 (htn, DM).  He admits to snoring, drinks alcohol nightly. Some caffeine but not excessive amounts. He has RT charcot foot  and is not able to exercise.   F/u in afib clinic, 5/31. He is staying in [place] but with Pac's. He is pending a sleep study. Avoiding alcohol, was drinking 4 cases a week. No bleeding issues with apixaban. Echo results reviewed.  F/u in afib clinic, 7/19, he has not noted any further afib. Continues on eliquis without bleeding issues. He had to reschedule sleep study due to back injury.He has lost 12 lbs.  F/u ER visit, 8/8. He presented to the ER with rapid afib. He states that his back pain flared and he started self medicating with alcohol(drank 10 beers the night before) and multiple tabs of ibuprofen. He went  off DOAC a week earlier to allow for the ibuprofen. Afib with v rate of 169 bpm on presentation. He was cardioverted. He is back on eliquis 5 mg bid. He has an appointment with orthopedist to discuss best way to proceed to deal with back pain.  Today, he denies symptoms of palpitations, chest pain, shortness of breath, orthopnea, PND, lower extremity edema, dizziness, presyncope, syncope, or neurologic sequela. The patient is tolerating medications without difficulties and is otherwise without complaint today.   Past Medical History:  Diagnosis Date  . Atrial fibrillation ([place])   . DDD (degenerative disc disease), lumbar   . Diabetes mellitus without complication ([place])   . Hypertension   . Rotator cuff disorder    Past Surgical History:    Procedure Laterality Date  . AMPUTATION Left 10/02/2014   Procedure: Left Third toe amputation ;  Surgeon: [name], MD;  Location: [facility];  Service: Orthopedics;  Laterality: Left;  Regular bed, wants to follow hip  . ANTERIOR CRUCIATE LIGAMENT REPAIR Right 90   reconstruction  . APPLICATION OF WOUND VAC Left 10/02/2014   Procedure: APPLICATION OF WOUND VAC; toe Surgeon: [name], MD;  Location: [facility];  Service: Orthopedics;  Laterality: Left;  . I&D EXTREMITY Left 10/05/2014   Procedure: IRRIGATION AND DEBRIDEMENT LEFT FOOT;  Surgeon: [name], MD;  Location: [facility];  Service: Orthopedics;  Laterality: Left;  . KNEE ARTHROSCOPY W/ ACL RECONSTRUCTION Right   . TOTAL KNEE ARTHROPLASTY Right 03/28/2015  . TOTAL KNEE ARTHROPLASTY Right 03/28/2015   Procedure: RIGHT TOTAL KNEE ARTHROPLASTY;  Surgeon: [name], MD;  Location: [facility];  Service: Orthopedics;  Laterality: Right;    Current Outpatient Prescriptions  Medication Sig Dispense Refill  . apixaban (ELIQUIS) 5 MG TABS tablet Take 1 tablet (5 mg total) by mouth 2 (two) times daily. 60 tablet 3  . Blood Glucose Monitoring Suppl (TRUE METRIX METER) DEVI 1 each by Does not apply route 3 (three) times daily before meals. 1 Device 0  . cyclobenzaprine (FLEXERIL) 10 MG tablet Take 1 tablet (10 mg total) by mouth 2 (two) times daily as needed for muscle spasms. 60 tablet 0  . glucose blood (TRUE METRIX BLOOD GLUCOSE  TEST) test strip Use 3 times daily before meals 100 each 12  . ibuprofen (ADVIL,MOTRIN) 200 MG tablet Take 200 mg by mouth every 6 (six) hours as needed for moderate pain.    Marland Kitchen insulin aspart (NOVOLOG) 100 UNIT/ML injection Inject 6 Units into the skin 3 (three) times daily with meals. 30 mL 3  . Insulin Glargine (LANTUS SOLOSTAR) 100 UNIT/ML Solostar Pen Inject 55 Units into the skin every morning. 5 pen 3  . insulin lispro (HUMALOG) 100 UNIT/ML injection Inject 0.06 mLs (6 Units total) into the skin 3 (three) times daily with  meals. (Patient taking differently: Inject 7 Units into the skin 3 (three) times daily with meals. ) 30 mL 3  . Insulin Pen Needle (B-D ULTRAFINE III SHORT PEN) 31G X 8 MM MISC 1 each by Does not apply route 3 (three) times daily. 100 each 5  . Insulin Syringe-Needle U-100 (TRUEPLUS INSULIN SYRINGE) 30G X 5/16" 0.5 ML MISC Use as directed 3 times daily 100 each 5  . losartan (COZAAR) 50 MG tablet Take 1 tablet (50 mg total) by mouth daily. 30 tablet 5  . metoprolol tartrate (LOPRESSOR) 50 MG tablet Take 1 tablet (50 mg total) by mouth 2 (two) times daily. 60 tablet 3  . TRUEPLUS INSULIN SYRINGE 30G X 5/16" 0.5 ML MISC USE AS DIRECTED 3 TIMES DAILY 100 each 0  . TRUEPLUS LANCETS 28G MISC 1 each by Does not apply route 3 (three) times daily before meals. 100 each 12  . gabapentin (NEURONTIN) 100 MG capsule Take 1 capsule (100 mg total) by mouth 3 (three) times daily. When necessary for neuropathy pain (Patient not taking: Reported on 11/04/2016) 90 capsule 3  . traMADol (ULTRAM) 50 MG tablet TAKE ONE TABLET BY MOUTH EVERY 12 HOURS AS NEEDED (Patient not taking: Reported on 11/04/2016) 40 tablet 0   No current facility-administered medications for this encounter.     No Known Allergies  Social History   Social History  . Marital status: Single    Spouse name: N/A  . Number of children: N/A  . Years of education: N/A   Occupational History  . Landscaping & rental handyman    Social History Main Topics  . Smoking status: Former Smoker    Packs/day: 0.25    Years: 38.00    Types: Cigarettes  . Smokeless tobacco: Never Used     Comment: 2 cigs weekly  . Alcohol use 3.0 - 3.6 oz/week    5 - 6 Cans of beer per week     Comment: 2 -3days week goes to bar to have "a couple of beers so he can sleep"  . Drug use: Yes    Types: Marijuana     Comment: last week  . Sexual activity: Not on file   Other Topics Concern  . Not on file   Social History Narrative   Lives alone.    Family  History  Problem Relation Age of Onset  . Diabetes Father   . Hypertension Father   . Heart failure Father     ROS- All systems are reviewed and negative except as per the HPI above  Physical Exam: Vitals:   11/04/16 1427  BP: (!) 146/82  Pulse: 79  Weight: (!) 314 lb 3.2 oz (142.5 kg)  Height: 6\' 2"  (1.88 m)   Wt Readings from Last 3 Encounters:  11/04/16 (!) 314 lb 3.2 oz (142.5 kg)  11/04/16 (!) 311 lb (141.1 kg)  10/30/16 (!) 311  lb 6.4 oz (141.3 kg)    Labs: Lab Results  Component Value Date   NA 140 11/04/2016   K 5.1 11/04/2016   CL 103 11/04/2016   CO2 22 11/04/2016   GLUCOSE 152 (H) 11/04/2016   BUN 12 11/04/2016   CREATININE 0.80 11/04/2016   CALCIUM 9.6 11/04/2016   MG 2.1 08/04/2016   Lab Results  Component Value Date   INR 1.04 08/04/2016   Lab Results  Component Value Date   CHOL 191 11/04/2016   HDL 42 11/04/2016   LDLCALC 108 (H) 11/04/2016   TRIG 205 (H) 11/04/2016     GEN- The patient is well appearing, alert and oriented x 3 today.   Head- normocephalic, atraumatic Eyes-  Sclera clear, conjunctiva pink Ears- hearing intact Oropharynx- clear Neck- supple, no JVP Lymph- no cervical lymphadenopathy Lungs- Clear to ausculation bilaterally, normal work of breathing Heart- Regular rate and rhythm, no murmurs, rubs or gallops, PMI not laterally displaced GI- soft, NT, ND, + BS Extremities- no clubbing, cyanosis, or edema MS- no significant deformity or atrophy Skin- no rash or lesion Psych- euthymic mood, full affect Neuro- strength and sensation are intact  EKG- Sinus tach at 106 bpm, pr int 162 ms, qrs int 90 ms, qtc 435 ms Epic records reviewed Echo-Study Conclusions  - Left ventricle: The cavity size was mildly dilated. Wall   thickness was increased in a pattern of mild LVH. Systolic   function was normal. The estimated ejection fraction was in the   range of 50% to 55%. Wall motion was normal; there were no   regional wall  motion abnormalities. Doppler parameters are   consistent with abnormal left ventricular relaxation (grade 1   diastolic dysfunction). - Aortic valve: Transvalvular velocity was within the normal range.   There was no stenosis. There was no regurgitation. - Mitral valve: Transvalvular velocity was within the normal range.   There was no evidence for stenosis. There was trivial   regurgitation. - Left atrium: The atrium was moderately dilated. - Right ventricle: The cavity size was normal. Wall thickness was   normal. Systolic function was normal. - Tricuspid valve: There was no regurgitation.    Assessment and Plan: 1. Paroxysmal afib  Afib had been quiet until pt started on a drinking binge He knows that the two times he has recently heavily drank that he went into afib and vows not to do it again Continue eliquis 5 mg bid for chadsvasc score of 2, advised not to stop and to avoid ibuprofen/NSAIDS General bleeding precautions discussed Continue lopressor 50 mg bid  2. Lifestyle issues Encouraged whatever exercise pt could do without aggravating foot problems Limit/stop alcohol and excessive caffeine Snoring history and sleep study pending Weight loss of 12 lbs since I started seeing him and encouraged to continue He plans to try and  get a better way to deal with with back pain, appointment pending with orthopedics, wants him to refer to a pain specialist  Will refer to general cardiology for f/u afib and other risk factors for coronary disease Pending establishing with  Dr. Claiborne Billings as he was his father's MD  Geroge Baseman. [name], [facility] [address] [place], [place] [address] [phone]

## 2016-11-12 ENCOUNTER — Telehealth: Payer: Self-pay

## 2016-11-12 NOTE — Telephone Encounter (Signed)
Pt was called and informed of lab results and medication being sent to pharmacy.

## 2016-11-16 MED FILL — !ELIQUIS 5MG TABLET: 5 | 30 days supply | Qty: 60 | Fill #1

## 2016-11-16 MED FILL — ?METOPROLOL 50 MG TABLET: 50 | 30 days supply | Qty: 60 | Fill #1

## 2016-11-19 ENCOUNTER — Telehealth (INDEPENDENT_AMBULATORY_CARE_PROVIDER_SITE_OTHER): Payer: Self-pay | Admitting: Orthopaedic Surgery

## 2016-11-19 MED ORDER — AMOXICILLIN 500 MG PO TABS: ORAL_TABLET | ORAL | 0 refills | Status: DC

## 2016-11-19 MED FILL — AMOXICILLIN 500 MG CAPSULE: 500 | 1 days supply | Qty: 4 | Fill #0

## 2016-11-19 NOTE — Telephone Encounter (Signed)
2 g amoxicillin 30 mins before procedure

## 2016-11-19 NOTE — Telephone Encounter (Signed)
See message below °

## 2016-11-19 NOTE — Telephone Encounter (Signed)
Sent in to pharm.

## 2016-11-19 NOTE — Telephone Encounter (Signed)
Patient has a dentist appointment this coming Monday and was needing a prescription for his antibiotics. If you could send the prescription to the [facility] center for him to pick up. Thank you. CB # [phone]

## 2016-11-19 NOTE — Addendum Note (Signed)
Addended by: Precious Bard on: 11/19/2016 02:16 PM   Modules accepted: Orders

## 2016-11-23 ENCOUNTER — Ambulatory Visit (INDEPENDENT_AMBULATORY_CARE_PROVIDER_SITE_OTHER): Payer: Self-pay | Admitting: Physical Medicine and Rehabilitation

## 2016-11-23 ENCOUNTER — Encounter (INDEPENDENT_AMBULATORY_CARE_PROVIDER_SITE_OTHER): Payer: Self-pay | Admitting: Physical Medicine and Rehabilitation

## 2016-11-23 VITALS — BP 154/94 | HR 88

## 2016-11-23 DIAGNOSIS — M25551 Pain in right hip: Secondary | ICD-10-CM

## 2016-11-23 DIAGNOSIS — M545 Low back pain, unspecified: Secondary | ICD-10-CM

## 2016-11-23 DIAGNOSIS — G8929 Other chronic pain: Secondary | ICD-10-CM

## 2016-11-23 DIAGNOSIS — M5116 Intervertebral disc disorders with radiculopathy, lumbar region: Secondary | ICD-10-CM

## 2016-11-23 MED ORDER — TIZANIDINE HCL 4 MG PO CAPS: 4.0000 mg | ORAL_CAPSULE | Freq: Every day | ORAL | 0 refills | Status: DC

## 2016-11-23 MED FILL — tiZANidine HCL 4 MG CAPS: 4 | 30 days supply | Qty: 30 | Fill #0

## 2016-11-23 NOTE — Progress Notes (Deleted)
Lower back pain for several years. Worse the past few months. Pain in the center of low back and into right hip at times.Numbness into right thigh. Says leg feels weak and like it would hold him up when he first stands up. Difficulty sleeping due to pain.   Also seen in follow up for midline low back pain. This is acute on chronic. Did help a friend retile a floor in July, was doing repetitive lifting and bending. Patient denies any radicular pain that we states he has had radicular symptoms in the past. today his pain at this time is primarily focused in the lumbar spine. Has been taking Flexeril which has been somewhat helpful. Did stop his eliquis for several days so he could take ibuprofen which provides him relief. However while off the Eliquis did have a visit to ED over this weekend for Afib. Is now back on Eliquis. Trialed Prednisone as well over last few weeks as prescribed by Dr. Sharol Given without relief

## 2016-11-23 NOTE — Progress Notes (Signed)
Jeffery [name] - 56 y.o. male MRN [id number]  Date of birth: Oct 13, 1960  Office Visit Note: Visit Date: 11/23/2016 PCP: Arnoldo Morale, MD Referred by: Arnoldo Morale, MD  Subjective: Chief Complaint  Patient presents with  . Lower Back - Pain   HPI: Mr. Baccam is a 56 year old gentleman who is followed in the office by Dr. Sharol Given and Dr. Erlinda Hong as well as Dondra Prader. He does have diabetes type 2 and his had complications with chart note joint of the foot as well as poor healing ulcers. His case is further complicated by being on Eliquis. He was just put back on the back. He was told because he thought he may be looking back injections at some point that he could come off of his Eliquis for a couple of days. He comes in today at the request of Dondra Prader, FN-P for evaluation of his back and right hip pain. He reports several years of worsening back pain but most recently he was helping a friend tile floor in July and the repetitive lifting and bending really has flared up his back pain. He reports a history of 2002 having significant disc herniation but did not have any surgery. He had significant radicular pain at that point. He does get some pain into the right thigh and he gets a little bit of numbness in the right thigh anterolaterally and posterolaterally. He feels like his legs are weak at times the distal 1 holding up. He's been having difficulty sleeping lately because of the pain. Pain medications including Flexeril and ibuprofen have not helped that much. He's had epidural injections in the past when it was bad 2002. He has not had recent MRI. He denies any focal weakness or bowel or bladder changes. Pain is really worse with forward flexion in sitting but can also hurt with standing.    Review of Systems  Constitutional: Negative for chills, fever, malaise/fatigue and weight loss.  HENT: Negative for hearing loss and sinus pain.   Eyes: Negative for blurred vision, double vision and  photophobia.  Respiratory: Negative for cough and shortness of breath.   Cardiovascular: Negative for chest pain, palpitations and leg swelling.  Gastrointestinal: Negative for abdominal pain, nausea and vomiting.  Genitourinary: Negative for flank pain.  Musculoskeletal: Positive for back pain and joint pain. Negative for myalgias.  Skin: Negative for itching and rash.  Neurological: Positive for tingling. Negative for tremors, focal weakness and weakness.  Endo/Heme/Allergies: Negative.   Psychiatric/Behavioral: Negative for depression.  All other systems reviewed and are negative.  Otherwise per HPI.  Assessment & Plan: Visit Diagnoses:  1. Chronic midline low back pain without sciatica   2. Pain in right hip   3. Radiculopathy due to lumbar intervertebral disc disorder     Plan: Findings:  Chronic worsening history of low back pain which I feel is multifactorial and likely related to arthritis as well as history of disc herniations. He may in fact have a new disc herniation or annular tear by the sound of the clinical picture. I do think is warranted to try an epidural injection which would be a right L4-5 interlaminar epidural steroid injection. This would be diagnostic and hopefully therapeutic. He would do well with weight loss as well as activity modification and physical therapy as a refresher course and she's had this in the past. His case is really complicated by diabetes with complications as well as being on blood thinner. He is allowed to be off the  Eliquis for 2 days which we will go ahead and allow that to happen and try to get him in pretty quick for the injection. He'll stay on his current medications. We talked about the natural history of disc herniations and disc problems. If he doesn't get much relief for the relief changes character after the injection we might look at facet joint blocks one time.    Meds & Orders:  Meds ordered this encounter  Medications  .  tiZANidine (ZANAFLEX) 4 MG capsule    Sig: Take 1 capsule (4 mg total) by mouth at bedtime.    Dispense:  30 capsule    Refill:  0   No orders of the defined types were placed in this encounter.   Follow-up: Return for L4-5 interlaminar epidural steroid injection.   Procedures: No procedures performed  No notes on file   Clinical History: Lumbar spine MRI 03/02/2001  IMPRESSION L5-S1 DIFFUSE, BROAD BASED, DISK HERNIATION PROMINENT CENTRALLY AND LEFT POSTEROLATERALLY WITH ENCROACHMENT AND POSSIBLE CONTACT WITH THE L5 NERVE ROOT. 1.0 CM SOFT TISSUE MASS IN THE RIGHT PARAMEDIAN EPIDURAL SPACE WHICH IS HIGHLY SUSPICIOUS EXTRUDED DISK MATERIAL. L4-5 DIFFUSE, BROAD BASED, SHALLOW HERNIATION PROMINENT CENTRALLY WITH ACQUIRED MODERATE SPINAL CANAL STENOSIS. L3-4 DIFFUSE, BROAD BASED, ANNULAR DISK BULGE WITH CENTRAL PROMINENCE AND PROTRUSION RIGHT POSTEROLATERALLY WITH ENCROACHMENT ON THE L3 NERVE ROOT.  He reports that he has quit smoking. His smoking use included Cigarettes. He has a 9.50 pack-year smoking history. He has never used smokeless tobacco.   Recent Labs  05/14/16 1618 05/14/16 1641 06/05/16 1359 07/09/16 1537  HGBA1C 11.1  --  10.3  --   LABURIC  --  3.5*  --  4.8    Objective:  VS:  HT:    WT:   BMI:     BP:(!) 154/94  HR:88bpm  TEMP: ( )  RESP:  Physical Exam  Constitutional: He is oriented to person, place, and time. He appears well-developed and well-nourished. No distress.  HENT:  Head: Normocephalic and atraumatic.  Eyes: Pupils are equal, round, and reactive to light. Conjunctivae are normal.  Neck: Normal range of motion. Neck supple.  Cardiovascular: Regular rhythm and intact distal pulses.   Pulmonary/Chest: Effort normal. No respiratory distress.  Musculoskeletal:  Patient walks with a forward flexed spine. He has pain with extension of the lumbar spine. He has a negative slump test bilaterally although is equivocally positive on the right. He has  tight hamstrings she has good distal strength without clonus. He has no pain with hip rotation.  Neurological: He is alert and oriented to person, place, and time. He exhibits normal muscle tone. Coordination normal.  Skin: Skin is warm and dry. No rash noted. No erythema.  Psychiatric: He has a normal mood and affect.  Nursing note and vitals reviewed.   Ortho Exam Imaging: No results found.  Past Medical/Family/Surgical/Social History: Medications & Allergies reviewed per EMR Patient Active Problem List   Diagnosis Date Noted  . Paroxysmal atrial fibrillation ([place]) 10/30/2016  . Pain in right ankle and joints of right foot 07/09/2016  . Diabetic polyneuropathy associated with type 2 diabetes mellitus ([place]) 07/09/2016  . Chronic venous hypertension (idiopathic) with ulcer and inflammation of right lower extremity ([place]) 07/09/2016  . Impingement syndrome of right shoulder 07/07/2016  . Right foot pain 07/07/2016  . Charcot's joint of right foot 07/03/2016  . Morbid obesity ([place]) 06/05/2016  . S/P total knee replacement using cement 03/28/2015  . Knee osteoarthritis 11/12/2014  .  Hypertension 10/15/2014  . Osteomyelitis of toe of left foot ([place])   . Toe osteomyelitis, left ([place]) 10/01/2014  . Charcot foot due to diabetes mellitus ([place]) 10/01/2014  . Accelerated hypertension 10/01/2014  . Gangrene left third toe 10/01/2014  . SHOULDER JOINT INSTABILITY 05/27/2010  . SHOULDER PAIN, RIGHT 05/27/2010   Past Medical History:  Diagnosis Date  . Atrial fibrillation ([place])   . DDD (degenerative disc disease), lumbar   . Diabetes mellitus without complication ([place])   . Hypertension   . Rotator cuff disorder    Family History  Problem Relation Age of Onset  . Diabetes Father   . Hypertension Father   . Heart failure Father    Past Surgical History:  Procedure Laterality Date  . AMPUTATION Left 10/02/2014   Procedure: Left Third toe amputation ;  Surgeon: [name], MD;   Location: [facility];  Service: Orthopedics;  Laterality: Left;  Regular bed, wants to follow hip  . ANTERIOR CRUCIATE LIGAMENT REPAIR Right 90   reconstruction  . APPLICATION OF WOUND VAC Left 10/02/2014   Procedure: APPLICATION OF WOUND VAC; toe Surgeon: [name], MD;  Location: [facility];  Service: Orthopedics;  Laterality: Left;  . I&D EXTREMITY Left 10/05/2014   Procedure: IRRIGATION AND DEBRIDEMENT LEFT FOOT;  Surgeon: [name], MD;  Location: [facility];  Service: Orthopedics;  Laterality: Left;  . KNEE ARTHROSCOPY W/ ACL RECONSTRUCTION Right   . TOTAL KNEE ARTHROPLASTY Right 03/28/2015  . TOTAL KNEE ARTHROPLASTY Right 03/28/2015   Procedure: RIGHT TOTAL KNEE ARTHROPLASTY;  Surgeon: [name], MD;  Location: [facility];  Service: Orthopedics;  Laterality: Right;   Social History   Occupational History  . Landscaping & rental handyman    Social History Main Topics  . Smoking status: Former Smoker    Packs/day: 0.25    Years: 38.00    Types: Cigarettes  . Smokeless tobacco: Never Used     Comment: 2 cigs weekly  . Alcohol use 3.0 - 3.6 oz/week    5 - 6 Cans of beer per week     Comment: 2 -3days week goes to bar to have "a couple of beers so he can sleep"  . Drug use: Yes    Types: Marijuana     Comment: last week  . Sexual activity: Not on file

## 2016-11-26 ENCOUNTER — Encounter (INDEPENDENT_AMBULATORY_CARE_PROVIDER_SITE_OTHER): Payer: Self-pay | Admitting: Physical Medicine and Rehabilitation

## 2016-11-26 ENCOUNTER — Ambulatory Visit (INDEPENDENT_AMBULATORY_CARE_PROVIDER_SITE_OTHER): Payer: Self-pay | Admitting: Physical Medicine and Rehabilitation

## 2016-11-26 ENCOUNTER — Ambulatory Visit (INDEPENDENT_AMBULATORY_CARE_PROVIDER_SITE_OTHER): Payer: Self-pay

## 2016-11-26 VITALS — BP 125/85 | HR 104 | Temp 97.5°F

## 2016-11-26 DIAGNOSIS — M5116 Intervertebral disc disorders with radiculopathy, lumbar region: Secondary | ICD-10-CM

## 2016-11-26 MED ORDER — LIDOCAINE HCL (PF) 1 % IJ SOLN
2.0000 mL | Freq: Once | INTRAMUSCULAR | Status: AC
  Administered 2016-11-26: 2 mL

## 2016-11-26 MED ORDER — BETAMETHASONE SOD PHOS & ACET 6 (3-3) MG/ML IJ SUSP
12.0000 mg | Freq: Once | INTRAMUSCULAR | Status: AC
  Administered 2016-11-26: 12 mg

## 2016-11-26 NOTE — Progress Notes (Deleted)
Lower back pain for several years. Worse the past few months. Pain in the center of low back and into right hip at times.Numbness into right thigh. Says leg feels weak and like it would hold him up when he first stands up. Difficulty sleeping due to pain.   Also seen in follow up for midline low back pain. This is acute on chronic. Did help a friend retile a floor in July, was doing repetitive lifting and bending. Patient denies any radicular pain that we states he has had radicular symptoms in the past. today his pain at this time is primarily focused in the lumbar spine. Has been taking Flexeril which has been somewhat helpful. Did stop his eliquis for several days so he could take ibuprofen which provides him relief. However while off the Eliquis did have a visit to ED over this weekend for Afib. Is now back on Eliquis. Trialed Prednisone as well over last few weeks as prescribed by Dr. Sharol Given without relief

## 2016-11-26 NOTE — Patient Instructions (Signed)

## 2016-11-26 NOTE — Progress Notes (Deleted)
No changes with pain. Still having pain in middle of lower back and in left hip. Can't sleep on left side.

## 2016-11-27 NOTE — Procedures (Signed)
Jeffery [name] is a 56 year old gentleman who comes in today for planned L4-5 interlaminar epidural steroid injection. Please see our prior evaluation and management note for further details and justification.  Lumbar Epidural Steroid Injection - Interlaminar Approach with Fluoroscopic Guidance  Patient: Jeffery [name]      Date of Birth: Jul 22, 1960 MRN: [id number] PCP: Arnoldo Morale, MD      Visit Date: 11/26/2016   Universal Protocol:     Consent Given By: the patient  Position: PRONE  Additional Comments: Vital signs were monitored before and after the procedure. Patient was prepped and draped in the usual sterile fashion. The correct patient, procedure, and site was verified.   Injection Procedure Details:  Procedure Site One Meds Administered:  Meds ordered this encounter  Medications  . lidocaine (PF) (XYLOCAINE) 1 % injection 2 mL  . betamethasone acetate-betamethasone sodium phosphate (CELESTONE) injection 12 mg     Laterality: Right  Location/Site:  L4-L5  Needle size: 20 G  Needle type: Tuohy  Needle Placement: Paramedian epidural  Findings:  -Contrast Used: 1 mL iohexol 180 mg iodine/mL   -Comments: Excellent flow of contrast into the epidural space.  Procedure Details: Using a paramedian approach from the side mentioned above, the region overlying the inferior lamina was localized under fluoroscopic visualization and the soft tissues overlying this structure were infiltrated with 4 ml. of 1% Lidocaine without Epinephrine. The Tuohy needle was inserted into the epidural space using a paramedian approach.   The epidural space was localized using loss of resistance along with lateral and bi-planar fluoroscopic views.  After negative aspirate for air, blood, and CSF, a 2 ml. volume of Isovue-250 was injected into the epidural space and the flow of contrast was observed. Radiographs were obtained for documentation purposes.    The injectate was administered  into the level noted above.   Additional Comments:  The patient tolerated the procedure well Dressing: Band-Aid    Post-procedure details: Patient was observed during the procedure. Post-procedure instructions were reviewed.  Patient left the clinic in stable condition.

## 2016-11-28 MED FILL — LOSARTAN POTASSIUM 50 MG TA: 50 | 30 days supply | Qty: 30 | Fill #1

## 2016-12-01 MED FILL — $LANTUS SOLOSTAR 100 UNITS/: 100 | 27 days supply | Qty: 15 | Fill #1

## 2016-12-01 MED FILL — $Humalog 100u/ml vial: 100 | 28 days supply | Qty: 10 | Fill #2

## 2016-12-16 ENCOUNTER — Telehealth (INDEPENDENT_AMBULATORY_CARE_PROVIDER_SITE_OTHER): Payer: Self-pay | Admitting: Physical Medicine and Rehabilitation

## 2016-12-16 DIAGNOSIS — M5416 Radiculopathy, lumbar region: Secondary | ICD-10-CM

## 2016-12-16 NOTE — Telephone Encounter (Signed)
Patient had injection and still having pain every day. Not getting better-not sleeping, can't get comfortable may need pain meds.PLEASE call

## 2016-12-16 NOTE — Telephone Encounter (Signed)
Order placed

## 2016-12-16 NOTE — Telephone Encounter (Signed)
Needs lumbar spine MRI if not much relief.

## 2016-12-16 NOTE — Telephone Encounter (Signed)
Right L4-5 IL 8/30. Please advise.

## 2016-12-21 ENCOUNTER — Telehealth: Payer: Self-pay | Admitting: Family Medicine

## 2016-12-21 ENCOUNTER — Ambulatory Visit: Payer: Self-pay | Attending: Family Medicine

## 2016-12-21 DIAGNOSIS — M545 Low back pain: Principal | ICD-10-CM

## 2016-12-21 DIAGNOSIS — G8929 Other chronic pain: Secondary | ICD-10-CM

## 2016-12-21 MED FILL — ?METOPROLOL 50 MG TABLET: 50 | 30 days supply | Qty: 60 | Fill #2

## 2016-12-21 MED FILL — !ELIQUIS 5MG TABLET: 5 | 30 days supply | Qty: 60 | Fill #2

## 2016-12-21 NOTE — Telephone Encounter (Signed)
Patient came into office requesting referral to pain clinic for back pain . Please f/up

## 2016-12-21 NOTE — Telephone Encounter (Signed)
Will route to PCP 

## 2016-12-22 ENCOUNTER — Telehealth (INDEPENDENT_AMBULATORY_CARE_PROVIDER_SITE_OTHER): Payer: Self-pay | Admitting: Radiology

## 2016-12-22 NOTE — Telephone Encounter (Signed)
Patient calling wants to know the status of MRI, he states someone was suppose to call him last week in regards

## 2016-12-22 NOTE — Telephone Encounter (Signed)
Referral placed.

## 2016-12-23 NOTE — Telephone Encounter (Signed)
Pt was called and informed of referral being placed. ?

## 2016-12-31 ENCOUNTER — Ambulatory Visit: Payer: Self-pay | Attending: Family Medicine | Admitting: Physician Assistant

## 2016-12-31 VITALS — BP 118/82 | HR 91 | Temp 98.3°F | Resp 18 | Ht 75.0 in | Wt 311.0 lb

## 2016-12-31 DIAGNOSIS — E1165 Type 2 diabetes mellitus with hyperglycemia: Secondary | ICD-10-CM

## 2016-12-31 DIAGNOSIS — IMO0002 Reserved for concepts with insufficient information to code with codable children: Secondary | ICD-10-CM

## 2016-12-31 DIAGNOSIS — Z89422 Acquired absence of other left toe(s): Secondary | ICD-10-CM | POA: Insufficient documentation

## 2016-12-31 DIAGNOSIS — L03031 Cellulitis of right toe: Secondary | ICD-10-CM | POA: Insufficient documentation

## 2016-12-31 DIAGNOSIS — I10 Essential (primary) hypertension: Secondary | ICD-10-CM | POA: Insufficient documentation

## 2016-12-31 DIAGNOSIS — I4891 Unspecified atrial fibrillation: Secondary | ICD-10-CM | POA: Insufficient documentation

## 2016-12-31 DIAGNOSIS — E118 Type 2 diabetes mellitus with unspecified complications: Secondary | ICD-10-CM | POA: Insufficient documentation

## 2016-12-31 DIAGNOSIS — Z794 Long term (current) use of insulin: Secondary | ICD-10-CM | POA: Insufficient documentation

## 2016-12-31 DIAGNOSIS — M5136 Other intervertebral disc degeneration, lumbar region: Secondary | ICD-10-CM | POA: Insufficient documentation

## 2016-12-31 LAB — POCT GLYCOSYLATED HEMOGLOBIN (HGB A1C): Hemoglobin A1C: 8.7

## 2016-12-31 LAB — POCT URINALYSIS DIPSTICK
Bilirubin, UA: NEGATIVE
Blood, UA: NEGATIVE
Glucose, UA: 250
Ketones, UA: NEGATIVE
Leukocytes, UA: NEGATIVE
Nitrite, UA: NEGATIVE
Spec Grav, UA: 1.025 (ref 1.010–1.025)
Urobilinogen, UA: 4 E.U./dL — AB
pH, UA: 6 (ref 5.0–8.0)

## 2016-12-31 LAB — GLUCOSE, POCT (MANUAL RESULT ENTRY): POC Glucose: 249 mg/dl — AB (ref 70–99)

## 2016-12-31 MED ORDER — INSULIN GLARGINE 100 UNIT/ML SOLOSTAR PEN: PEN_INJECTOR | SUBCUTANEOUS | 3 refills | Status: DC

## 2016-12-31 MED ORDER — DOXYCYCLINE HYCLATE 100 MG PO TABS: 100.0000 mg | ORAL_TABLET | Freq: Two times a day (BID) | ORAL | 0 refills | Status: DC

## 2016-12-31 MED ORDER — LOSARTAN POTASSIUM 50 MG PO TABS: 50.0000 mg | ORAL_TABLET | Freq: Every day | ORAL | 5 refills | Status: DC

## 2016-12-31 MED ORDER — INSULIN LISPRO 100 UNIT/ML ~~LOC~~ SOLN: 7.0000 [IU] | Freq: Three times a day (TID) | SUBCUTANEOUS | 3 refills | Status: DC

## 2016-12-31 MED FILL — LOSARTAN POTASSIUM 50 MG TA: 50 | 30 days supply | Qty: 30 | Fill #2

## 2016-12-31 MED FILL — $LANTUS SOLOSTAR 100 UNITS/: 100 | 22 days supply | Qty: 15 | Fill #0

## 2016-12-31 MED FILL — $Humalog 100u/ml vial: 100 | 30 days supply | Qty: 10 | Fill #0

## 2016-12-31 MED FILL — GABAPENTIN 100 MG CAPSULE: 100 | 30 days supply | Qty: 90 | Fill #1

## 2016-12-31 MED FILL — DOXYCYCLINE 100 MG TABLET: 100 | 10 days supply | Qty: 20 | Fill #0

## 2016-12-31 NOTE — Progress Notes (Signed)
Patient ID: Jeffery [name], male   DOB: 12/11/1960, 56 y.o.   MRN: [id number]   Jeffery [name], is a 56 y.o. male  [id number]:[id number]  [id number]:[id number]  DOB - [birth year]  Subjective:  Chief Complaint and HPI: Jeffery [name] is a 56 y.o. male here today for R foot redness and some discomfort.  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  Foot has been more red last few days.    Not checking blood sugars regularly.    Seeing Dr Ernestina Patches for back pain and has an MRI scheduled for tomorrow.    Recent episodes of afib-denies palpitations or CP now.  No SOB.    ROS:   Constitutional:  No f/c, No night sweats, No unexplained weight loss. EENT:  No vision changes, No blurry vision, No hearing changes. No mouth, throat, or ear problems.  Respiratory: No cough, No SOB Cardiac: No CP, no palpitations GI:  No abd pain, No N/V/D. GU: No Urinary s/sx Musculoskeletal: +back pain, mild R foot pain Neuro: No headache, no dizziness, no motor weakness.  Skin: No rash Endocrine:  No polydipsia. No polyuria.  Psych: Denies SI/HI  No problems updated.  ALLERGIES: No Known Allergies  PAST MEDICAL HISTORY: Past Medical History:  Diagnosis Date  . Atrial fibrillation ([place])   . DDD (degenerative disc disease), lumbar   . Diabetes mellitus without complication ([place])   . Hypertension   . Rotator cuff disorder     MEDICATIONS AT HOME: Prior to Admission medications   Medication Sig Start Date End Date Taking? Authorizing Provider  apixaban (ELIQUIS) 5 MG TABS tablet Take 1 tablet (5 mg total) by mouth 2 (two) times daily. 09/04/16  Yes Sherran Needs, NP  atorvastatin (LIPITOR) 20 MG tablet Take 1 tablet (20 mg total) by mouth daily. 11/05/16  Yes Arnoldo Morale, MD  Blood Glucose Monitoring Suppl (TRUE METRIX METER) DEVI 1 each by Does not apply route 3 (three) times daily before meals. 06/19/16  Yes Arnoldo Morale, MD  cyclobenzaprine (FLEXERIL) 10 MG tablet Take 1 tablet (10 mg total) by mouth 2 (two)  times daily as needed for muscle spasms. 10/30/16  Yes Arnoldo Morale, MD  gabapentin (NEURONTIN) 100 MG capsule Take 1 capsule (100 mg total) by mouth 3 (three) times daily. When necessary for neuropathy pain 09/02/16  Yes Newt Minion, MD  glucose blood (TRUE METRIX BLOOD GLUCOSE TEST) test strip Use 3 times daily before meals 06/19/16  Yes Amao, Charlane Ferretti, MD  ibuprofen (ADVIL,MOTRIN) 200 MG tablet Take 200 mg by mouth every 6 (six) hours as needed for moderate pain.   Yes [provider]  insulin aspart (NOVOLOG) 100 UNIT/ML injection Inject 6 Units into the skin 3 (three) times daily with meals. 06/05/16  Yes Arnoldo Morale, MD  Insulin Glargine (LANTUS SOLOSTAR) 100 UNIT/ML Solostar Pen 34 units in the morning and at bedtime 12/31/16  Yes Charie Pinkus M, PA-C  insulin lispro (HUMALOG) 100 UNIT/ML injection Inject 0.07 mLs (7 Units total) into the skin 3 (three) times daily with meals. 12/31/16  Yes Dontre Laduca M, PA-C  Insulin Pen Needle (B-D ULTRAFINE III SHORT PEN) 31G X 8 MM MISC 1 each by Does not apply route 3 (three) times daily. 10/30/16  Yes Arnoldo Morale, MD  Insulin Syringe-Needle U-100 (TRUEPLUS INSULIN SYRINGE) 30G X 5/16" 0.5 ML MISC Use as directed 3 times daily 12/14/14  Yes Amao, Enobong, MD  losartan (COZAAR) 50 MG tablet Take 1 tablet (50 mg total) by mouth daily.  12/31/16  Yes Freeman Caldron M, PA-C  metoprolol tartrate (LOPRESSOR) 50 MG tablet Take 1 tablet (50 mg total) by mouth 2 (two) times daily. 09/04/16  Yes Sherran Needs, NP  tiZANidine (ZANAFLEX) 4 MG capsule Take 1 capsule (4 mg total) by mouth at bedtime. 11/23/16  Yes Magnus Sinning, MD  traMADol (ULTRAM) 50 MG tablet TAKE ONE TABLET BY MOUTH EVERY 12 HOURS AS NEEDED 09/02/16  Yes Arnoldo Morale, MD  TRUEPLUS INSULIN SYRINGE 30G X 5/16" 0.5 ML MISC USE AS DIRECTED 3 TIMES DAILY 04/03/16  Yes Jegede, Marlena Clipper, MD  TRUEPLUS LANCETS 28G MISC 1 each by Does not apply route 3 (three) times daily before meals. 06/19/16   Yes Arnoldo Morale, MD  doxycycline (VIBRA-TABS) 100 MG tablet Take 1 tablet (100 mg total) by mouth 2 (two) times daily. 12/31/16   Argentina Donovan, PA-C     Objective:  EXAM:   Vitals:   12/31/16 1354  BP: 118/82  Pulse: 91  Resp: 18  Temp: 98.3 F (36.8 C)  TempSrc: Oral  SpO2: 99%  Weight: (!) 311 lb (141.1 kg)  Height: 6\' 3"  (1.905 m)    General appearance : A&OX3. NAD. Non-toxic-appearing HEENT: Atraumatic and Normocephalic.  PERRLA. EOM intact.   Neck: supple, no JVD. No cervical lymphadenopathy. No thyromegaly Chest/Lungs:  Breathing-non-labored, Good air entry bilaterally, breath sounds normal without rales, rhonchi, or wheezing  CVS: S1 S2 regular, no murmurs, gallops, rubs  Extremities: Bilateral Lower Ext shows no edema, both legs are warm to touch with = pulse throughout.  R foot and great toe compared to L is erythematous with tight skin around the 1st digit.  Decreased ROM, +mildy TTP around nailbed.  Delayed capillary RF.  No necrosis Neurology:  CN II-XII grossly intact, Non focal.   Psych:  TP linear. J/I WNL. Normal speech. Appropriate eye contact and affect.  Skin:  No Rash  Data Review Lab Results  Component Value Date   HGBA1C 8.7 12/31/2016   HGBA1C 10.3 06/05/2016   HGBA1C 11.1 05/14/2016     Assessment & Plan   1. Type 2 diabetes mellitus with complication, with long-term current use of insulin (HCC) Uncontrolled-Increase dose-- Insulin Glargine (LANTUS SOLOSTAR) 100 UNIT/ML Solostar Pen; 34 units in the morning and at bedtime  Dispense: 5 pen; Refill: 3. Continue other diabetes meds as they are.  Check blood sugars tid-qid and record and bring to next visit. - POCT glycosylated hemoglobin (Hb A1C) - Glucose (CBG) - Urinalysis Dipstick  2. Essential hypertension Controlled-continue current regimen - losartan (COZAAR) 50 MG tablet; Take 1 tablet (50 mg total) by mouth daily.  Dispense: 30 tablet; Refill: 5    4. Cellulitis of toe of right  foot At risk for gangrene.  He will schedule an appt with Dr Erlinda Hong or Dr Sharol Given within the next week.  He will call our office if he has any touble getting this appt. - doxycycline (VIBRA-TABS) 100 MG tablet; Take 1 tablet (100 mg total) by mouth 2 (two) times daily.  Dispense: 20 tablet; Refill: 0     Patient have been counseled extensively about nutrition and exercise  Return in about 3 months (around 04/02/2017) for Dr Amao-DM and htn.  The patient was given clear instructions to go to ER or return to medical center if symptoms don't improve, worsen or new problems develop. The patient verbalized understanding. The patient was told to call to get lab results if they haven't heard anything in the next  week.     Freeman Caldron, PA-C [facility] [place], [place]   12/31/2016, 2:19 PM

## 2016-12-31 NOTE — Patient Instructions (Addendum)
Check blood sugars 3-4 times daily and record and bring to next visit.   Make appt with Dr Erlinda Hong or Dr Sharol Given within the next week to look at foot.

## 2017-01-01 ENCOUNTER — Telehealth (INDEPENDENT_AMBULATORY_CARE_PROVIDER_SITE_OTHER): Payer: Self-pay | Admitting: Radiology

## 2017-01-01 ENCOUNTER — Ambulatory Visit (HOSPITAL_COMMUNITY)
Admission: RE | Admit: 2017-01-01 | Discharge: 2017-01-01 | Disposition: A | Discharge status: Home / Self Care | Payer: Self-pay | Source: Ambulatory Visit | Attending: Physical Medicine and Rehabilitation | Admitting: Physical Medicine and Rehabilitation

## 2017-01-01 DIAGNOSIS — M5116 Intervertebral disc disorders with radiculopathy, lumbar region: Secondary | ICD-10-CM | POA: Insufficient documentation

## 2017-01-01 DIAGNOSIS — M5126 Other intervertebral disc displacement, lumbar region: Secondary | ICD-10-CM | POA: Insufficient documentation

## 2017-01-01 DIAGNOSIS — M5416 Radiculopathy, lumbar region: Secondary | ICD-10-CM

## 2017-01-01 DIAGNOSIS — M48061 Spinal stenosis, lumbar region without neurogenic claudication: Secondary | ICD-10-CM | POA: Insufficient documentation

## 2017-01-01 NOTE — Telephone Encounter (Signed)
Patient called, he has MRI today and wanted to sched a post MRI appt.  I called him and sched one for 01/12/17, he says this is too far out.  He is in severe pain and needs something done sooner. Can you call him to advise if you can get him in sooner?  Mornings are hard for him.

## 2017-01-04 ENCOUNTER — Other Ambulatory Visit: Payer: Self-pay

## 2017-01-04 MED ORDER — APIXABAN 5 MG PO TABS: 5.0000 mg | ORAL_TABLET | Freq: Two times a day (BID) | ORAL | 3 refills | Status: DC

## 2017-01-05 ENCOUNTER — Ambulatory Visit (INDEPENDENT_AMBULATORY_CARE_PROVIDER_SITE_OTHER): Payer: Self-pay | Admitting: Orthopedic Surgery

## 2017-01-05 ENCOUNTER — Ambulatory Visit (INDEPENDENT_AMBULATORY_CARE_PROVIDER_SITE_OTHER): Payer: Self-pay

## 2017-01-05 DIAGNOSIS — M79671 Pain in right foot: Secondary | ICD-10-CM

## 2017-01-05 DIAGNOSIS — I87321 Chronic venous hypertension (idiopathic) with inflammation of right lower extremity: Secondary | ICD-10-CM

## 2017-01-05 DIAGNOSIS — S92424A Nondisplaced fracture of distal phalanx of right great toe, initial encounter for closed fracture: Secondary | ICD-10-CM | POA: Insufficient documentation

## 2017-01-05 DIAGNOSIS — M6701 Short Achilles tendon (acquired), right ankle: Secondary | ICD-10-CM

## 2017-01-05 NOTE — Telephone Encounter (Signed)
Called patient and scheduled him 01/06/17 at 1000 due to cancellation at that time.

## 2017-01-05 NOTE — Progress Notes (Signed)
Office Visit Note   Patient: Jeffery [name]           Date of Birth: 02-Feb-1961           MRN: [id number] Visit Date: 01/05/2017              Requested by: Arnoldo Morale, MD [place], [place] [id number] PCP: Arnoldo Morale, MD  No chief complaint on file.     HPI: Patient is a 56 year old gentleman with diabetic insensate neuropathy degenerative disc disease of his lumbar spine venous insufficiency of the lower extremities with previous Charcot collapse of the right navicular. Patient states he recently had some redness and swelling of the right great toe. He was started on some antibiotics and is seen for evaluation for the redness and swelling right great toe.  Assessment & Plan: Visit Diagnoses:  1. Pain in right foot   2. Achilles tendon contracture, right   3. Closed nondisplaced fracture of distal phalanx of right great toe, initial encounter   4. 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 Patient was given instructions for heel cord stretching this was demonstrated to him. Plan to follow-up as needed. Discussed that if the redness gets worse or the swelling gets worse to follow up immediately and we will place him in a fracture boot.  Follow-Up Instructions: Return if symptoms worsen or fail to improve.   Ortho Exam  Patient is alert, oriented, no adenopathy, well-dressed, normal affect, normal respiratory effort. Examination patient has a good dorsalis pedis pulse he does have heel cord tightness with dorsiflexion short of neutral with his knee extended. He does have some redness and swelling of the great toe there is no open wounds no cellulitis no induration. The previous Charcot fracture through the navicular shows no active process no redness. He has no tenderness to palpation around the great toe. There is no angular  deformity no ulcers.  Imaging: Xr Foot Complete Right  Result Date: 01/05/2017 Three-view radiographs of the right foot shows a very small nondisplaced fracture of the tuft of the right great toe. The navicular Charcot fracture is stable with no loss of the arch. No rocker-bottom deformity.  No images are attached to the encounter.  Labs: Lab Results  Component Value Date   HGBA1C 8.7 12/31/2016   HGBA1C 10.3 06/05/2016   HGBA1C 11.1 05/14/2016   ESRSEDRATE 1 03/18/2015   CRP 0.6 03/18/2015   LABURIC 4.8 07/09/2016   LABURIC 3.5 (L) 05/14/2016   REPTSTATUS 10/06/2014 FINAL 10/01/2014   GRAMSTAIN  10/01/2014    ABUNDANT WBC PRESENT, PREDOMINANTLY PMN ABUNDANT GRAM POSITIVE COCCI IN CLUSTERS FEW SQUAMOUS EPITHELIAL CELLS PRESENT Gram Stain Report Called to,Read Back By and Verified With: Vanessa Barbara [phone] @ [id number]  10/01/2014    RARE WBC PRESENT, PREDOMINANTLY PMN NO SQUAMOUS EPITHELIAL CELLS SEEN ABUNDANT GRAM POSITIVE COCCI IN PAIRS IN CLUSTERS Performed at [place]  10/01/2014    NO GROWTH 5 DAYS Performed at [place] 10/01/2014    Orders:  Orders Placed This Encounter  Procedures  . XR Foot Complete Right   No orders of the defined types were placed in this encounter.    Procedures: No procedures performed  Clinical Data: No additional findings.  ROS:  All other systems negative, except as noted in the HPI. Review of Systems  Objective: Vital Signs: There were no vitals taken for this visit.  Specialty Comments:  No specialty comments available.  PMFS History: Patient Active Problem List   Diagnosis Date Noted  . Achilles tendon contracture, right 01/05/2017  . Closed nondisplaced fracture of distal phalanx of right great toe 01/05/2017  . Paroxysmal atrial fibrillation ([place]) 10/30/2016  . Pain in right ankle and joints of right foot 07/09/2016  .  Diabetic polyneuropathy associated with type 2 diabetes mellitus ([place]) 07/09/2016  . 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 Impingement syndrome of right shoulder 07/07/2016  . Pain in right foot 07/07/2016  . Charcot's joint of right foot 07/03/2016  . Morbid obesity ([place]) 06/05/2016  . S/P total knee replacement using cement 03/28/2015  . Knee osteoarthritis 11/12/2014  . Hypertension 10/15/2014  . Osteomyelitis of toe of left foot ([place])   . Toe osteomyelitis, left ([place]) 10/01/2014  . Charcot foot due to diabetes mellitus ([place]) 10/01/2014  . Accelerated hypertension 10/01/2014  . Gangrene left third toe 10/01/2014  . SHOULDER JOINT INSTABILITY 05/27/2010  . SHOULDER PAIN, RIGHT 05/27/2010   Past Medical History:  Diagnosis Date  . Atrial fibrillation ([place])   . DDD (degenerative disc disease), lumbar   . Diabetes mellitus without complication ([place])   . Hypertension   . Rotator cuff disorder     Family History  Problem Relation Age of Onset  . Diabetes Father   . Hypertension Father   . Heart failure Father     Past Surgical History:  Procedure Laterality Date  . AMPUTATION Left 10/02/2014   Procedure: Left Third toe amputation ;  Surgeon: [name], MD;  Location: [facility];  Service: Orthopedics;  Laterality: Left;  Regular bed, wants to follow hip  . ANTERIOR CRUCIATE LIGAMENT REPAIR Right 90   reconstruction  . APPLICATION OF WOUND VAC Left 10/02/2014   Procedure: APPLICATION OF WOUND VAC; toe Surgeon: [name], MD;  Location: [place];  Service: Orthopedics;  Laterality: Left;  . I&D EXTREMITY Left 10/05/2014   Procedure: IRRIGATION AND DEBRIDEMENT LEFT FOOT;  Surgeon: [name], MD;  Location: [place] [facility];  Service: Orthopedics;  Laterality: Left;  . KNEE ARTHROSCOPY W/ ACL RECONSTRUCTION Right   . TOTAL KNEE ARTHROPLASTY Right 03/28/2015  . TOTAL KNEE ARTHROPLASTY Right 03/28/2015   Procedure: RIGHT TOTAL KNEE  ARTHROPLASTY;  Surgeon: [name], MD;  Location: [facility];  Service: Orthopedics;  Laterality: Right;   Social History   Occupational History  . Landscaping & rental handyman    Social History Main Topics  . Smoking status: Former Smoker    Packs/day: 0.00    Years: 38.00    Types: Cigarettes  . Smokeless tobacco: Never Used     Comment: no smoking in 60 days  . Alcohol use No     Comment: no drinks in 60 days  . Drug use: Yes    Types: Marijuana     Comment: last week  . Sexual activity: Not on file

## 2017-01-06 ENCOUNTER — Encounter (INDEPENDENT_AMBULATORY_CARE_PROVIDER_SITE_OTHER): Payer: Self-pay | Admitting: Physical Medicine and Rehabilitation

## 2017-01-06 ENCOUNTER — Ambulatory Visit (INDEPENDENT_AMBULATORY_CARE_PROVIDER_SITE_OTHER): Payer: Self-pay | Admitting: Physical Medicine and Rehabilitation

## 2017-01-06 VITALS — BP 146/91 | HR 77

## 2017-01-06 DIAGNOSIS — M545 Low back pain, unspecified: Secondary | ICD-10-CM

## 2017-01-06 DIAGNOSIS — M5116 Intervertebral disc disorders with radiculopathy, lumbar region: Secondary | ICD-10-CM

## 2017-01-06 DIAGNOSIS — M25551 Pain in right hip: Secondary | ICD-10-CM

## 2017-01-06 DIAGNOSIS — G8929 Other chronic pain: Secondary | ICD-10-CM

## 2017-01-06 MED ORDER — HYDROCODONE-ACETAMINOPHEN 5-325 MG PO TABS: 1.0000 | ORAL_TABLET | Freq: Three times a day (TID) | ORAL | 0 refills | Status: DC | PRN

## 2017-01-06 NOTE — Progress Notes (Deleted)
MRI review. Pain has increased. Not resting well, tries to sleep sitting in recliner, but that also causes pain. Starting to have pain in both hips.

## 2017-01-12 ENCOUNTER — Ambulatory Visit (INDEPENDENT_AMBULATORY_CARE_PROVIDER_SITE_OTHER): Payer: Self-pay

## 2017-01-12 ENCOUNTER — Ambulatory Visit (INDEPENDENT_AMBULATORY_CARE_PROVIDER_SITE_OTHER): Payer: Self-pay | Admitting: Physical Medicine and Rehabilitation

## 2017-01-12 ENCOUNTER — Encounter (INDEPENDENT_AMBULATORY_CARE_PROVIDER_SITE_OTHER): Payer: Self-pay | Admitting: Physical Medicine and Rehabilitation

## 2017-01-12 ENCOUNTER — Telehealth: Payer: Self-pay

## 2017-01-12 VITALS — BP 154/96 | HR 72 | Temp 98.4°F

## 2017-01-12 DIAGNOSIS — M5116 Intervertebral disc disorders with radiculopathy, lumbar region: Secondary | ICD-10-CM

## 2017-01-12 MED ORDER — LIDOCAINE HCL (PF) 1 % IJ SOLN
2.0000 mL | Freq: Once | INTRAMUSCULAR | Status: AC
  Administered 2017-01-12: 2 mL

## 2017-01-12 MED ORDER — BETAMETHASONE SOD PHOS & ACET 6 (3-3) MG/ML IJ SUSP
12.0000 mg | Freq: Once | INTRAMUSCULAR | Status: AC
  Administered 2017-01-12: 12 mg

## 2017-01-12 NOTE — Telephone Encounter (Signed)
Pt contacted the office and stated he has not heard anything regarding his referral to pain management I informed pt that we do only have one referral coordinator and we are trying to get catch up with referrals as fast as we can. Please f/u

## 2017-01-12 NOTE — Patient Instructions (Signed)

## 2017-01-12 NOTE — Progress Notes (Deleted)
Here for planned bilateral L4 TF. No changes with back pain.

## 2017-01-18 ENCOUNTER — Telehealth (INDEPENDENT_AMBULATORY_CARE_PROVIDER_SITE_OTHER): Payer: Self-pay | Admitting: Physical Medicine and Rehabilitation

## 2017-01-18 ENCOUNTER — Encounter (INDEPENDENT_AMBULATORY_CARE_PROVIDER_SITE_OTHER): Payer: Self-pay | Admitting: Physical Medicine and Rehabilitation

## 2017-01-18 NOTE — Telephone Encounter (Signed)
Left message advising patient to follow up with Dr. Sharol Given.

## 2017-01-18 NOTE — Telephone Encounter (Signed)
Follow up with Jeffery [name] or see if they want him to see neurosurgeon/spine for stenosis, two injections have been no help.

## 2017-01-18 NOTE — Progress Notes (Signed)
Jeffery [name] - 56 y.o. male MRN [id number]  Date of birth: [birth year]  Office Visit Note: Visit Date: 01/06/2017 PCP: Arnoldo Morale, MD Referred by: Arnoldo Morale, MD  Subjective: Chief Complaint  Patient presents with  . Lower Back - Pain   HPI: Jeffery [name] is a 56 year old gentleman with type 2 diabetes with multiple complications who is followed by mainly Dr. Sharol Given in our office was also Dr. Erlinda Hong. I saw him recently completed interlaminar epidural steroid injection at L4-5 really without much relief. He has had a history of multiple orthopedic complaints as well as complicated issues with his diabetes and pain complaints. Reviewing his medication history over the last couple of years he has had narcotic medications off and on. He has not had any recently. He is having with various procedures. He comes in today for MRI review. His last MRI was 2002 when the injection didn't help much for his back pain we did update his MRI. This is reviewed below but basically shows disc herniation and moderate stenosis at L4-5. He is having worsening pain and can get much rest at night. He does try to sleep in a recliner. He's having pain in both hips down not just on the right. He reports worsening pain. No real paresthesia other than some tingling in the feet that he's had for a while. No focal weakness. No bowel or bladder difficulties. No new trauma. He has had physical therapy in the remote past but not recently. He is obese. He gets some relief with position change but basically says nothing seems to help at this point.    Review of Systems  Constitutional: Negative for chills, fever, malaise/fatigue and weight loss.  HENT: Negative for hearing loss and sinus pain.   Eyes: Negative for blurred vision, double vision and photophobia.  Respiratory: Negative for cough and shortness of breath.   Cardiovascular: Negative for chest pain, palpitations and leg swelling.  Gastrointestinal: Negative for  abdominal pain, nausea and vomiting.  Genitourinary: Negative for flank pain.  Musculoskeletal: Positive for back pain and joint pain. Negative for myalgias.  Skin: Negative for itching and rash.  Neurological: Positive for tingling. Negative for tremors, focal weakness and weakness.  Endo/Heme/Allergies: Negative.   Psychiatric/Behavioral: Negative for depression.  All other systems reviewed and are negative.  Otherwise per HPI.  Assessment & Plan: Visit Diagnoses:  1. Radiculopathy due to lumbar intervertebral disc disorder   2. Chronic midline low back pain without sciatica   3. Pain in right hip     Plan: Findings:  Chronic worsening severe fairly constant low back pain and bilateral hip pain some indication of radicular type pain but nothing shooting down the leg. Worse in any position even resting. He hasn't found relief with current medications. Past medical and frequently if he could have something for pain. He really states that he needs something for pain a few times in the office. I did review the controlled substance reporting has not had any opioid medications. I did agree to a 7 day course of as needed small dose hydrocodone. I will also recommended transforaminal epidural steroid injection at L4 to see if that gives him some relief. If he gets no relief with that did my best recommendation for him is to continue with conservative care and weight loss and core strengthening as well as possibly seeing a spine surgeon for evaluation for the stenosis. I would not recommend long-term chronic opioids. He could seek out on his own and  see if there was anyone willing to treat his condition in that regard. He'll continue to follow up with Dr. Sharol Given for his orthopedic care.    Meds & Orders:  Meds ordered this encounter  Medications  . HYDROcodone-acetaminophen (NORCO/VICODIN) 5-325 MG tablet    Sig: Take 1 tablet by mouth every 8 (eight) hours as needed for moderate pain.    Dispense:   21 tablet    Refill:  0   No orders of the defined types were placed in this encounter.   Follow-up: Return for Bilateral L4 transforaminal steroid injection.   Procedures: No procedures performed  No notes on file   Clinical History: MRI LUMBAR SPINE WITHOUT CONTRAST  TECHNIQUE: Multiplanar, multisequence MR imaging of the lumbar spine was performed. No intravenous contrast was administered.  COMPARISON:  None available (03/02/2001)  FINDINGS: Segmentation:  Standard.  Alignment:  Slight anterolisthesis at L4-5.  Mild dextrocurvature.  Vertebrae: There is marrow edema on both sides of the L4-5 leftward disc, where there is likely an annular fissure/protrusion. Slight degenerative edematous change about the T12-L1 disc.  Conus medullaris: Extends to the L1 level and appears normal.  Paraspinal and other soft tissues: Negative  Disc levels:  T12- L1: Posterior disc bulging without impingement  L1-L2: Degenerative disc narrowing with bulging and small endplate spurs. Ventral thecal sac flattening and inferior foraminal effacement without visible compressive stenosis.  L2-L3: Mild disc narrowing and bulging. Negative facets. No impingement  L3-L4: Right foraminal and far-lateral disc bulging/ protrusion. Negative facets. Congenital narrow appearance of the thecal sac without superimposed degenerative stenosis. Right inferior foraminal effacement without L3 compression  L4-L5: Facet arthropathy with mild anterolisthesis. There is degenerative disc narrowing with central protrusion. Moderate spinal stenosis. Bilateral subarticular recess stenosis which could affect either L5 nerve root. Moderate foraminal narrowing without visible compression.  L5-S1:Moderate degenerative disc narrowing. There is a central up turning disc protrusion. Endplate spurring, disc bulging and disc height loss causes bilateral foraminal stenosis with mild flattening of the  left L5 nerve root at the foraminal exit on the sagittals. Patent thecal sac.  IMPRESSION: 1. Diffuse disc degeneration with milder facet arthropathy. 2. L4-5 moderate spinal stenosis due to hypertrophic facets and central disc protrusion. Large annular fissure in the left far-lateral disc with neighboring marrow edema; this could cause irritation of the neighboring L4 nerve root. 3. L5-S1 biforaminal stenosis with mild deformity of the left L5 nerve root. 4. Noncompressive degenerative changes as above.   Electronically Signed   By: Monte Fantasia M.D.   On: 01/01/2017 13:32  Lumbar spine MRI 03/02/2001  IMPRESSION L5-S1 DIFFUSE, BROAD BASED, DISK HERNIATION PROMINENT CENTRALLY AND LEFT POSTEROLATERALLY WITH ENCROACHMENT AND POSSIBLE CONTACT WITH THE L5 NERVE ROOT. 1.0 CM SOFT TISSUE MASS IN THE RIGHT PARAMEDIAN EPIDURAL SPACE WHICH IS HIGHLY SUSPICIOUS EXTRUDED DISK MATERIAL. L4-5 DIFFUSE, BROAD BASED, SHALLOW HERNIATION PROMINENT CENTRALLY WITH ACQUIRED MODERATE SPINAL CANAL STENOSIS. L3-4 DIFFUSE, BROAD BASED, ANNULAR DISK BULGE WITH CENTRAL PROMINENCE AND PROTRUSION RIGHT POSTEROLATERALLY WITH ENCROACHMENT ON THE L3 NERVE ROOT.  He reports that he has quit smoking. His smoking use included Cigarettes. He smoked 0.00 packs per day for 38.00 years. He has never used smokeless tobacco.   Recent Labs  05/14/16 1618 05/14/16 1641 06/05/16 1359 07/09/16 1537 12/31/16 1401  HGBA1C 11.1  --  10.3  --  8.7  LABURIC  --  3.5*  --  4.8  --     Objective:  VS:  HT:    WT:  BMI:     BP:(!) 146/91  HR:77bpm  TEMP: ( )  RESP:  Physical Exam  Constitutional: He is oriented to person, place, and time. He appears well-developed and well-nourished. No distress.  Obese  HENT:  Head: Normocephalic and atraumatic.  Eyes: Pupils are equal, round, and reactive to light. Conjunctivae are normal.  Neck: Normal range of motion. Neck supple.  Cardiovascular: Regular rhythm and  intact distal pulses.   Pulmonary/Chest: Effort normal. No respiratory distress.  Musculoskeletal:  Pain with extension of the lumbar spine. No paraspinal tenderness no pain over the greater trochanters no pain with hip rotation good distal strength and no clonus.  Neurological: He is alert and oriented to person, place, and time. He exhibits normal muscle tone. Coordination normal.  Skin: Skin is warm and dry. No rash noted. No erythema.  Psychiatric: He has a normal mood and affect.  Nursing note and vitals reviewed.   Ortho Exam Imaging: No results found.  Past Medical/Family/Surgical/Social History: Medications & Allergies reviewed per EMR Patient Active Problem List   Diagnosis Date Noted  . Achilles tendon contracture, right 01/05/2017  . Closed nondisplaced fracture of distal phalanx of right great toe 01/05/2017  . Paroxysmal atrial fibrillation ([place]) 10/30/2016  . Pain in right ankle and joints of right foot 07/09/2016  . Diabetic polyneuropathy associated with type 2 diabetes mellitus ([place]) 07/09/2016  . 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 Impingement syndrome of right shoulder 07/07/2016  . Pain in right foot 07/07/2016  . Charcot's joint of right foot 07/03/2016  . Morbid obesity ([place]) 06/05/2016  . S/P total knee replacement using cement 03/28/2015  . Knee osteoarthritis 11/12/2014  . Hypertension 10/15/2014  . Osteomyelitis of toe of left foot ([place])   . Toe osteomyelitis, left ([place]) 10/01/2014  . Charcot foot due to diabetes mellitus ([place]) 10/01/2014  . Accelerated hypertension 10/01/2014  . Gangrene left third toe 10/01/2014  . SHOULDER JOINT INSTABILITY 05/27/2010  . SHOULDER PAIN, RIGHT 05/27/2010   Past Medical History:  Diagnosis Date  . Atrial fibrillation ([place])   . DDD (degenerative disc disease), lumbar   . Diabetes mellitus without complication ([place])   . Hypertension   . Rotator cuff  disorder    Family History  Problem Relation Age of Onset  . Diabetes Father   . Hypertension Father   . Heart failure Father    Past Surgical History:  Procedure Laterality Date  . AMPUTATION Left 10/02/2014   Procedure: Left Third toe amputation ;  Surgeon: [name], MD;  Location: [place];  Service: Orthopedics;  Laterality: Left;  Regular bed, wants to follow hip  . ANTERIOR CRUCIATE LIGAMENT REPAIR Right 90   reconstruction  . APPLICATION OF WOUND VAC Left 10/02/2014   Procedure: APPLICATION OF WOUND VAC; toe Surgeon: [name], MD;  Location: [place];  Service: Orthopedics;  Laterality: Left;  . I&D EXTREMITY Left 10/05/2014   Procedure: IRRIGATION AND DEBRIDEMENT LEFT FOOT;  Surgeon: [name], MD;  Location: [facility];  Service: Orthopedics;  Laterality: Left;  . KNEE ARTHROSCOPY W/ ACL RECONSTRUCTION Right   . TOTAL KNEE ARTHROPLASTY Right 03/28/2015  . TOTAL KNEE ARTHROPLASTY Right 03/28/2015   Procedure: RIGHT TOTAL KNEE ARTHROPLASTY;  Surgeon: [name], MD;  Location: [facility];  Service: Orthopedics;  Laterality: Right;   Social History   Occupational History  . Landscaping & rental handyman    Social History Main Topics  .  Smoking status: Former Smoker    Packs/day: 0.00    Years: 38.00    Types: Cigarettes  . Smokeless tobacco: Never Used     Comment: no smoking in 60 days  . Alcohol use No     Comment: no drinks in 60 days  . Drug use: Yes    Types: Marijuana     Comment: last week  . Sexual activity: Not on file

## 2017-01-19 ENCOUNTER — Ambulatory Visit: Payer: Self-pay | Admitting: Cardiovascular Disease

## 2017-01-20 MED FILL — $ELIQUIS 5 MG TABLET: 5 | 30 days supply | Qty: 60 | Fill #0 | Status: TO

## 2017-01-20 MED FILL — ?METOPROLOL 50 MG TABLET: 50 | 30 days supply | Qty: 60 | Fill #3

## 2017-01-20 MED FILL — $LANTUS SOLOSTAR 100 UNITS/: 100 | 22 days supply | Qty: 15 | Fill #1

## 2017-01-25 NOTE — Procedures (Signed)
Jeffery [name] is a 56 year old gentleman who comes in today for planned bilateral L4 transforaminal steroid injection.  Please see our prior evaluation and management note for further details and justification.  He is asking for more opioid pain medications.  We did provide him with a short duration of medication prior to the injection.  We are going to see how the injection does and evaluate that afterwards.  His spine condition does not warrant chronic opioid medications.  Lumbosacral Transforaminal Epidural Steroid Injection - Sub-Pedicular Approach with Fluoroscopic Guidance  Patient: Jeffery [name]      Date of Birth: 07/25/1960 MRN: [id number] PCP: Arnoldo Morale, MD      Visit Date: 01/12/2017   Universal Protocol:    Date/Time: 01/12/2017  Consent Given By: the patient  Position: PRONE  Additional Comments: Vital signs were monitored before and after the procedure. Patient was prepped and draped in the usual sterile fashion. The correct patient, procedure, and site was verified.   Injection Procedure Details:  Procedure Site One Meds Administered:  Meds ordered this encounter  Medications  . lidocaine (PF) (XYLOCAINE) 1 % injection 2 mL  . betamethasone acetate-betamethasone sodium phosphate (CELESTONE) injection 12 mg    Laterality: Bilateral  Location/Site:  L4-L5  Needle size: 22 G  Needle type: Spinal  Needle Placement: Transforaminal  Findings:  -Contrast Used: 0.5 mL iohexol 180 mg iodine/mL   -Comments: Excellent flow of contrast along the nerve and into the epidural space.  Procedure Details: After squaring off the end-plates to get a true AP view, the C-arm was positioned so that an oblique view of the foramen as noted above was visualized. The target area is just inferior to the "nose of the scotty dog" or sub pedicular. The soft tissues overlying this structure were infiltrated with 2-3 ml. of 1% Lidocaine without Epinephrine.  The spinal needle was  inserted toward the target using a "trajectory" view along the fluoroscope beam.  Under AP and lateral visualization, the needle was advanced so it did not puncture dura and was located close the 6 O'Clock position of the pedical in AP tracterory. Biplanar projections were used to confirm position. Aspiration was confirmed to be negative for CSF and/or blood. A 1-2 ml. volume of Isovue-250 was injected and flow of contrast was noted at each level. Radiographs were obtained for documentation purposes.   After attaining the desired flow of contrast documented above, a 0.5 to 1.0 ml test dose of 0.25% Marcaine was injected into each respective transforaminal space.  The patient was observed for 90 seconds post injection.  After no sensory deficits were reported, and normal lower extremity motor function was noted,   the above injectate was administered so that equal amounts of the injectate were placed at each foramen (level) into the transforaminal epidural space.   Additional Comments:  The patient tolerated the procedure well Dressing: Band-Aid    Post-procedure details: Patient was observed during the procedure. Post-procedure instructions were reviewed.  Patient left the clinic in stable condition.

## 2017-02-01 MED FILL — LOSARTAN POTASSIUM 50 MG TA: 50 | 30 days supply | Qty: 30 | Fill #3

## 2017-02-04 NOTE — Telephone Encounter (Signed)
Sent referral thru Epic to [facility]  Physical Medicine and Rehabilitation because he has CAFA and they will take 6 weeks to review the referral and decide if they can see the patient

## 2017-02-08 ENCOUNTER — Ambulatory Visit: Payer: Medicaid Other | Attending: Family Medicine | Admitting: Physician Assistant

## 2017-02-08 ENCOUNTER — Encounter: Payer: Self-pay | Admitting: Physician Assistant

## 2017-02-08 ENCOUNTER — Other Ambulatory Visit: Payer: Self-pay

## 2017-02-08 VITALS — BP 154/88 | HR 75 | Temp 98.3°F | Resp 20 | Wt 313.0 lb

## 2017-02-08 DIAGNOSIS — I10 Essential (primary) hypertension: Secondary | ICD-10-CM | POA: Diagnosis not present

## 2017-02-08 DIAGNOSIS — G8929 Other chronic pain: Secondary | ICD-10-CM

## 2017-02-08 DIAGNOSIS — Z794 Long term (current) use of insulin: Secondary | ICD-10-CM | POA: Insufficient documentation

## 2017-02-08 DIAGNOSIS — Z7901 Long term (current) use of anticoagulants: Secondary | ICD-10-CM | POA: Diagnosis not present

## 2017-02-08 DIAGNOSIS — R9389 Abnormal findings on diagnostic imaging of other specified body structures: Secondary | ICD-10-CM | POA: Diagnosis not present

## 2017-02-08 DIAGNOSIS — Z79899 Other long term (current) drug therapy: Secondary | ICD-10-CM | POA: Diagnosis not present

## 2017-02-08 DIAGNOSIS — I4891 Unspecified atrial fibrillation: Secondary | ICD-10-CM | POA: Insufficient documentation

## 2017-02-08 DIAGNOSIS — M5442 Lumbago with sciatica, left side: Secondary | ICD-10-CM

## 2017-02-08 DIAGNOSIS — M5441 Lumbago with sciatica, right side: Secondary | ICD-10-CM | POA: Insufficient documentation

## 2017-02-08 DIAGNOSIS — M5136 Other intervertebral disc degeneration, lumbar region: Secondary | ICD-10-CM | POA: Diagnosis not present

## 2017-02-08 DIAGNOSIS — R937 Abnormal findings on diagnostic imaging of other parts of musculoskeletal system: Secondary | ICD-10-CM | POA: Diagnosis not present

## 2017-02-08 DIAGNOSIS — E1165 Type 2 diabetes mellitus with hyperglycemia: Secondary | ICD-10-CM | POA: Diagnosis not present

## 2017-02-08 LAB — GLUCOSE, POCT (MANUAL RESULT ENTRY): POC Glucose: 225 mg/dL — AB (ref 70–99)

## 2017-02-08 MED ORDER — ACETAMINOPHEN-CODEINE #3 300-30 MG PO TABS: 1.0000 | ORAL_TABLET | Freq: Four times a day (QID) | ORAL | 0 refills | Status: DC | PRN

## 2017-02-08 MED FILL — ACETAMINOPHEN/COD #3 TABLET: 300-30 | 7 days supply | Qty: 30 | Fill #0

## 2017-02-08 NOTE — Progress Notes (Signed)
Jeffery [name], is a 56 y.o. male  NKN:[id number]  FXT:[id number]  DOB - Sep 30, 1960  Subjective:  Chief Complaint and HPI: Jeffery [name] is a 56 y.o. male here today for continued LBP and leg pain that is long-standing.  He has been awaiting a call about pain management.  He denies new symptoms.  Tylenol not helping.  Says referral was supposed to be placed for pain management but he still doesn't have appt.  No new/different symptoms. No new weakness/paresthesias.   MRI was abnormal: IMPRESSION: 1. Diffuse disc degeneration with milder facet arthropathy. 2. L4-5 moderate spinal stenosis due to hypertrophic facets and central disc protrusion. Large annular fissure in the left far-lateral disc with neighboring marrow edema; this could cause irritation of the neighboring L4 nerve root. 3. L5-S1 biforaminal stenosis with mild deformity of the left L5 nerve root. 4. Noncompressive degenerative changes as above.  Diabetes not controlled.  He has not been checking his blood sugars.  Doesn't check BP OOO.  He hasn't drank alcohol in ~100 days!!  ROS:   Constitutional:  No f/c, No night sweats, No unexplained weight loss. EENT:  No vision changes, No blurry vision, No hearing changes. No mouth, throat, or ear problems.  Respiratory: No cough, No SOB Cardiac: No CP, no palpitations GI:  No abd pain, No N/V/D. GU: No Urinary s/sx Musculoskeletal: +LBP Neuro: No headache, no dizziness, no motor weakness.  Skin: No rash Endocrine:  No polydipsia. No polyuria.  Psych: Denies SI/HI  No problems updated.  ALLERGIES: No Known Allergies  PAST MEDICAL HISTORY: Past Medical History:  Diagnosis Date  . Atrial fibrillation ([place])   . DDD (degenerative disc disease), lumbar   . Diabetes mellitus without complication ([place])   . Hypertension   . Rotator cuff disorder     MEDICATIONS AT HOME: Prior to Admission medications   Medication Sig Start Date End Date Taking? Authorizing  Provider  acetaminophen-codeine (TYLENOL #3) 300-30 MG tablet Take 1 tablet every 6 (six) hours as needed by mouth for moderate pain. 02/08/17   Argentina Donovan, PA-C  apixaban (ELIQUIS) 5 MG TABS tablet Take 1 tablet (5 mg total) by mouth 2 (two) times daily. 01/04/17   Arnoldo Morale, MD  atorvastatin (LIPITOR) 20 MG tablet Take 1 tablet (20 mg total) by mouth daily. 11/05/16   Arnoldo Morale, MD  Blood Glucose Monitoring Suppl (TRUE METRIX METER) DEVI 1 each by Does not apply route 3 (three) times daily before meals. 06/19/16   Arnoldo Morale, MD  cyclobenzaprine (FLEXERIL) 10 MG tablet Take 1 tablet (10 mg total) by mouth 2 (two) times daily as needed for muscle spasms. 10/30/16   Arnoldo Morale, MD  doxycycline (VIBRA-TABS) 100 MG tablet Take 1 tablet (100 mg total) by mouth 2 (two) times daily. 12/31/16   Argentina Donovan, PA-C  gabapentin (NEURONTIN) 100 MG capsule Take 1 capsule (100 mg total) by mouth 3 (three) times daily. When necessary for neuropathy pain 09/02/16   Newt Minion, MD  glucose blood (TRUE METRIX BLOOD GLUCOSE TEST) test strip Use 3 times daily before meals 06/19/16   Arnoldo Morale, MD  ibuprofen (ADVIL,MOTRIN) 200 MG tablet Take 200 mg by mouth every 6 (six) hours as needed for moderate pain.    [provider]  insulin aspart (NOVOLOG) 100 UNIT/ML injection Inject 6 Units into the skin 3 (three) times daily with meals. 06/05/16   Arnoldo Morale, MD  Insulin Glargine (LANTUS SOLOSTAR) 100 UNIT/ML Solostar Pen 34 units in  the morning and at bedtime 12/31/16   Freeman Caldron M, PA-C  insulin lispro (HUMALOG) 100 UNIT/ML injection Inject 0.07 mLs (7 Units total) into the skin 3 (three) times daily with meals. 12/31/16   Argentina Donovan, PA-C  Insulin Pen Needle (B-D ULTRAFINE III SHORT PEN) 31G X 8 MM MISC 1 each by Does not apply route 3 (three) times daily. 10/30/16   Arnoldo Morale, MD  Insulin Syringe-Needle U-100 (TRUEPLUS INSULIN SYRINGE) 30G X 5/16" 0.5 ML MISC Use as directed  3 times daily 12/14/14   Arnoldo Morale, MD  losartan (COZAAR) 50 MG tablet Take 1 tablet (50 mg total) by mouth daily. 12/31/16   Argentina Donovan, PA-C  metoprolol tartrate (LOPRESSOR) 50 MG tablet Take 1 tablet (50 mg total) by mouth 2 (two) times daily. 09/04/16   Sherran Needs, NP  tiZANidine (ZANAFLEX) 4 MG capsule Take 1 capsule (4 mg total) by mouth at bedtime. 11/23/16   Magnus Sinning, MD  TRUEPLUS INSULIN SYRINGE 30G X 5/16" 0.5 ML MISC USE AS DIRECTED 3 TIMES DAILY 04/03/16   Tresa Garter, MD  TRUEPLUS LANCETS 28G MISC 1 each by Does not apply route 3 (three) times daily before meals. 06/19/16   Arnoldo Morale, MD     Objective:  EXAM:   Vitals:   02/08/17 1332  BP: (!) 153/96  Pulse: 75  Resp: 20  Temp: 98.3 F (36.8 C)  TempSrc: Oral  SpO2: 99%  Weight: (!) 313 lb (142 kg)    General appearance : A&OX3. NAD. Non-toxic-appearing HEENT: Atraumatic and Normocephalic.  PERRLA. EOM intact.   Neck: supple, no JVD. No cervical lymphadenopathy. No thyromegaly Chest/Lungs:  Breathing-non-labored, Good air entry bilaterally, breath sounds normal without rales, rhonchi, or wheezing  CVS: S1 S2 regular, no murmurs, gallops, rubs  Extremities: Bilateral Lower Ext shows no edema, both legs are warm to touch with = pulse throughout Back exam is unchanged.  TTP paraspinus muscles B lumbar spine.  Poor ROM-unsure if partially related to obesity Neurology:  CN II-XII grossly intact, Non focal.   Psych:  TP linear. J/I WNL. Normal speech. Appropriate eye contact and affect.  Skin:  No Rash  Data Review Lab Results  Component Value Date   HGBA1C 8.7 12/31/2016   HGBA1C 10.3 06/05/2016   HGBA1C 11.1 05/14/2016     Assessment & Plan   1. Type 2 diabetes mellitus with hyperglycemia, with long-term current use of insulin (HCC) Uncontrolled.  Continue current regimen and work hard on diabetic diet(which he admits not following).  Check Blood sugars fasting and at bedtime over  the next week and record and bring to next OV.   - Glucose (CBG)  2. Abnormal MRI, spine - Ambulatory referral to Neurosurgery - Ambulatory referral to Pain Clinic  3. Chronic low back pain with bilateral sciatica, unspecified back pain laterality - Ambulatory referral to Neurosurgery - Ambulatory referral to Pain Clinic - acetaminophen-codeine (TYLENOL #3) 300-30 MG tablet; Take 1 tablet every 6 (six) hours as needed by mouth for moderate pain.  Dispense: 30 tablet; Refill: 0  4. Essential hypertension Uncontrolled but also in pain.  Continue current regimen and f/up with PCP next week as planned   Patient have been counseled extensively about nutrition and exercise  Return in about 7 days (around 02/15/2017) for appt already scheduled with Dr Carmie Kanner for DM, htn.  The patient was given clear instructions to go to ER or return to medical center if symptoms don't improve, worsen  or new problems develop. The patient verbalized understanding. The patient was told to call to get lab results if they haven't heard anything in the next week.     Freeman Caldron, PA-C [facility] and [facility] [place], [place]   02/08/2017, 2:05 PM   MRN: [id number]

## 2017-02-08 NOTE — Patient Instructions (Signed)
Check blood sugars fasting and at bedtime and bring to visit with your PCP next week.

## 2017-02-08 NOTE — Progress Notes (Signed)
Pt here for f/u body aches and pain: back, foot pain, arm pain. Want referral to pain clinic

## 2017-02-10 MED FILL — $Humalog 100u/ml vial: 100 | 30 days supply | Qty: 10 | Fill #1 | Status: TO

## 2017-02-10 MED FILL — $LANTUS SOLOSTAR 100 UNITS/: 100 | 22 days supply | Qty: 15 | Fill #2

## 2017-02-10 MED FILL — GABAPENTIN 100 MG CAPSULE: 100 | 30 days supply | Qty: 90 | Fill #2 | Status: TO

## 2017-02-10 MED FILL — ?ATORVASTATIN 20 MG TABLET: 20 | 30 days supply | Qty: 30 | Fill #0

## 2017-02-15 ENCOUNTER — Ambulatory Visit: Payer: Medicaid Other | Attending: Family Medicine | Admitting: Family Medicine

## 2017-02-15 ENCOUNTER — Encounter: Payer: Self-pay | Admitting: Family Medicine

## 2017-02-15 VITALS — BP 131/75 | HR 89 | Temp 97.6°F | Ht 75.0 in | Wt 319.4 lb

## 2017-02-15 DIAGNOSIS — E1169 Type 2 diabetes mellitus with other specified complication: Secondary | ICD-10-CM | POA: Diagnosis not present

## 2017-02-15 DIAGNOSIS — I10 Essential (primary) hypertension: Secondary | ICD-10-CM | POA: Insufficient documentation

## 2017-02-15 DIAGNOSIS — M5442 Lumbago with sciatica, left side: Secondary | ICD-10-CM | POA: Diagnosis not present

## 2017-02-15 DIAGNOSIS — Z96651 Presence of right artificial knee joint: Secondary | ICD-10-CM | POA: Diagnosis not present

## 2017-02-15 DIAGNOSIS — Z794 Long term (current) use of insulin: Secondary | ICD-10-CM | POA: Diagnosis not present

## 2017-02-15 DIAGNOSIS — M5441 Lumbago with sciatica, right side: Secondary | ICD-10-CM | POA: Insufficient documentation

## 2017-02-15 DIAGNOSIS — Z79899 Other long term (current) drug therapy: Secondary | ICD-10-CM | POA: Insufficient documentation

## 2017-02-15 DIAGNOSIS — I480 Paroxysmal atrial fibrillation: Secondary | ICD-10-CM | POA: Insufficient documentation

## 2017-02-15 DIAGNOSIS — G8929 Other chronic pain: Secondary | ICD-10-CM | POA: Insufficient documentation

## 2017-02-15 DIAGNOSIS — Z6839 Body mass index (BMI) 39.0-39.9, adult: Secondary | ICD-10-CM | POA: Diagnosis not present

## 2017-02-15 DIAGNOSIS — E1142 Type 2 diabetes mellitus with diabetic polyneuropathy: Secondary | ICD-10-CM | POA: Insufficient documentation

## 2017-02-15 MED ORDER — LIDOCAINE 5 % EX PTCH: 1.0000 | MEDICATED_PATCH | CUTANEOUS | 3 refills | Status: DC

## 2017-02-15 MED ORDER — ACETAMINOPHEN-CODEINE #3 300-30 MG PO TABS: 1.0000 | ORAL_TABLET | Freq: Every evening | ORAL | 0 refills | Status: DC | PRN

## 2017-02-15 MED ORDER — EXENATIDE 10 MCG/0.04ML ~~LOC~~ SOPN: 10.0000 ug | PEN_INJECTOR | Freq: Two times a day (BID) | SUBCUTANEOUS | 5 refills | Status: DC

## 2017-02-15 MED ORDER — GABAPENTIN 300 MG PO CAPS: 600.0000 mg | ORAL_CAPSULE | Freq: Every day | ORAL | 3 refills | Status: DC

## 2017-02-15 MED ORDER — TIZANIDINE HCL 4 MG PO CAPS: 4.0000 mg | ORAL_CAPSULE | Freq: Every day | ORAL | 3 refills | Status: DC

## 2017-02-15 MED FILL — GABAPENTIN 300 MG CAPSULE: 300 | 30 days supply | Qty: 60 | Fill #0 | Status: TO

## 2017-02-15 MED FILL — tiZANidine HCL 4 MG TABS: 4 | 30 days supply | Qty: 30 | Fill #0

## 2017-02-15 NOTE — Patient Instructions (Signed)

## 2017-02-15 NOTE — Progress Notes (Signed)
Subjective:  Patient ID: Jeffery [name], male    DOB: 04-06-1960  Age: 56 y.o. MRN: [id number]  CC: Diabetes and Back Pain   HPI Jeffery [name] is a 56 year old male with history of type 2 diabetes mellitus (A1c 8.7), hypertension, Charcot foot due to diabetes mellitus, Paroxysmal A. fib (currently on rate control with metoprolol and anticoagulation with Eliquis).  He presents today with complaints of low back pain which affects his gait; pain shoots down his left lower extremity.  Pain is described as a 6-7/10 at this time but he took a Tylenol No. 3 this morning. His back pain was managed by [facility] and he has received epidural spinal injections on 2 different occasions with no improvement in symptoms.  MRI lumbar spine without contrast 01/01/17: IMPRESSION: 1. Diffuse disc degeneration with milder facet arthropathy. 2. L4-5 moderate spinal stenosis due to hypertrophic facets and central disc protrusion. Large annular fissure in the left far-lateral disc with neighboring marrow edema; this could cause irritation of the neighboring L4 nerve root. 3. L5-S1 biforaminal stenosis with mild deformity of the left L5 nerve root. 4. Noncompressive degenerative changes as above.  He brings in his blood sugar log today which reveals fasting sugars ranging between 171 to 219 with the higher levels occurring when he has a late night snack.  He has had no hypoglycemia and denies visual concerns.  His diabetic neuropathy is controlled.  Regarding his Charcot foot he is followed by orthopedics and it is doing well at this time.  Past Medical History:  Diagnosis Date  . Atrial fibrillation ([place])   . DDD (degenerative disc disease), lumbar   . Diabetes mellitus without complication ([place])   . Hypertension   . Rotator cuff disorder     Past Surgical History:  Procedure Laterality Date  . ANTERIOR CRUCIATE LIGAMENT REPAIR Right 90   reconstruction  . APPLICATION OF WOUND  VAC Left 10/02/2014   Performed by [name], MD at [facility] OR  . IRRIGATION AND DEBRIDEMENT LEFT FOOT Left 10/05/2014   Performed by [name], MD at [facility] OR  . KNEE ARTHROSCOPY W/ ACL RECONSTRUCTION Right   . Left Third toe amputation Left 10/02/2014   Performed by [name], MD at [facility] Right 03/28/2015   Performed by [name], MD at [facility]  . TOTAL KNEE ARTHROPLASTY Right 03/28/2015    No Known Allergies   Outpatient Medications Prior to Visit  Medication Sig Dispense Refill  . apixaban (ELIQUIS) 5 MG TABS tablet Take 1 tablet (5 mg total) by mouth 2 (two) times daily. 180 tablet 3  . atorvastatin (LIPITOR) 20 MG tablet Take 1 tablet (20 mg total) by mouth daily. 30 tablet 3  . Blood Glucose Monitoring Suppl (TRUE METRIX METER) DEVI 1 each by Does not apply route 3 (three) times daily before meals. 1 Device 0  . doxycycline (VIBRA-TABS) 100 MG tablet Take 1 tablet (100 mg total) by mouth 2 (two) times daily. 20 tablet 0  . glucose blood (TRUE METRIX BLOOD GLUCOSE TEST) test strip Use 3 times daily before meals 100 each 12  . ibuprofen (ADVIL,MOTRIN) 200 MG tablet Take 200 mg by mouth every 6 (six) hours as needed for moderate pain.    . Insulin Glargine (LANTUS SOLOSTAR) 100 UNIT/ML Solostar Pen 34 units in the morning and at bedtime 5 pen 3  . Insulin Pen Needle (B-D ULTRAFINE III SHORT PEN) 31G  X 8 MM MISC 1 each by Does not apply route 3 (three) times daily. 100 each 5  . Insulin Syringe-Needle U-100 (TRUEPLUS INSULIN SYRINGE) 30G X 5/16" 0.5 ML MISC Use as directed 3 times daily 100 each 5  . losartan (COZAAR) 50 MG tablet Take 1 tablet (50 mg total) by mouth daily. 30 tablet 5  . metoprolol tartrate (LOPRESSOR) 50 MG tablet Take 1 tablet (50 mg total) by mouth 2 (two) times daily. 60 tablet 3  . TRUEPLUS INSULIN SYRINGE 30G X 5/16" 0.5 ML MISC USE AS DIRECTED 3 TIMES DAILY 100 each 0  . TRUEPLUS LANCETS 28G MISC 1 each by Does not apply route  3 (three) times daily before meals. 100 each 12  . gabapentin (NEURONTIN) 100 MG capsule Take 1 capsule (100 mg total) by mouth 3 (three) times daily. When necessary for neuropathy pain 90 capsule 3  . insulin lispro (HUMALOG) 100 UNIT/ML injection Inject 0.07 mLs (7 Units total) into the skin 3 (three) times daily with meals. 1 vial 3  . acetaminophen-codeine (TYLENOL #3) 300-30 MG tablet Take 1 tablet every 6 (six) hours as needed by mouth for moderate pain. (Patient not taking: Reported on 02/15/2017) 30 tablet 0  . cyclobenzaprine (FLEXERIL) 10 MG tablet Take 1 tablet (10 mg total) by mouth 2 (two) times daily as needed for muscle spasms. (Patient not taking: Reported on 02/15/2017) 60 tablet 0  . insulin aspart (NOVOLOG) 100 UNIT/ML injection Inject 6 Units into the skin 3 (three) times daily with meals. (Patient not taking: Reported on 02/15/2017) 30 mL 3  . tiZANidine (ZANAFLEX) 4 MG capsule Take 1 capsule (4 mg total) by mouth at bedtime. (Patient not taking: Reported on 02/15/2017) 30 capsule 0   No facility-administered medications prior to visit.     ROS Review of Systems  Constitutional: Negative for activity change and appetite change.  HENT: Negative for sinus pressure and sore throat.   Eyes: Negative for visual disturbance.  Respiratory: Negative for cough, chest tightness and shortness of breath.   Cardiovascular: Negative for chest pain and leg swelling.  Gastrointestinal: Negative for abdominal distention, abdominal pain, constipation and diarrhea.  Endocrine: Negative.   Genitourinary: Negative for dysuria.  Musculoskeletal: Positive for back pain. Negative for joint swelling and myalgias.  Skin: Negative for rash.  Allergic/Immunologic: Negative.   Neurological: Positive for numbness. Negative for weakness and light-headedness.  Psychiatric/Behavioral: Negative for dysphoric mood and suicidal ideas.    Objective:  BP 131/75   Pulse 89   Temp 97.6 F (36.4 C)  (Oral)   Ht 6\' 3"  (1.905 m)   Wt (!) 319 lb 6.4 oz (144.9 kg)   SpO2 100%   BMI 39.92 kg/m   BP/Weight 02/15/2017 02/08/2017 61/44/3154  Systolic BP 008 676 195  Diastolic BP 75 88 96  Wt. (Lbs) 319.4 313 -  BMI 39.92 39.12 -      Physical Exam  Constitutional: He is oriented to person, place, and time. He appears well-developed and well-nourished.  Cardiovascular: Normal rate, normal heart sounds and intact distal pulses.  No murmur heard. Pulmonary/Chest: Effort normal and breath sounds normal. He has no wheezes. He has no rales. He exhibits no tenderness.  Abdominal: Soft. Bowel sounds are normal. He exhibits no distension and no mass. There is no tenderness.  Musculoskeletal: He exhibits tenderness (+ve straight leg raise on the left).  Neurological: He is alert and oriented to person, place, and time.  Skin: Skin is warm and dry.  Psychiatric: He has a normal mood and affect.     Lab Results  Component Value Date   HGBA1C 8.7 12/31/2016    CMP Latest Ref Rng & Units 11/04/2016 11/01/2016 08/04/2016  Glucose 65 - 99 mg/dL 152(H) 314(H) 219(H)  BUN 6 - 24 mg/dL 12 13 16   Creatinine 0.76 - 1.27 mg/dL 0.80 0.85 0.89  Sodium 134 - 144 mmol/L 140 134(L) 135  Potassium 3.5 - 5.2 mmol/L 5.1 4.4 4.5  Chloride 96 - 106 mmol/L 103 105 105  CO2 20 - 29 mmol/L 22 22 22   Calcium 8.7 - 10.2 mg/dL 9.6 9.3 9.0  Total Protein 6.0 - 8.5 g/dL 7.2 - -  Total Bilirubin 0.0 - 1.2 mg/dL 0.4 - -  Alkaline Phos 39 - 117 IU/L 79 - -  AST 0 - 40 IU/L 15 - -  ALT 0 - 44 IU/L 22 - -    Assessment & Plan:   1. Chronic midline low back pain with bilateral sciatica Uncontrolled Status post epidural spinal injections x2 Continue back exercises Placed on Lidoderm patch; increased dose of gabapentin Pain management referral pending - gabapentin (NEURONTIN) 300 MG capsule; Take 2 capsules (600 mg total) at bedtime by mouth. When necessary for neuropathy pain  Dispense: 60 capsule; Refill: 3 -  tiZANidine (ZANAFLEX) 4 MG capsule; Take 1 capsule (4 mg total) at bedtime by mouth.  Dispense: 90 capsule; Refill: 3 - lidocaine (LIDODERM) 5 %; Place 1 patch daily onto the skin. Remove & Discard patch within 12 hours or as directed by MD  Dispense: 30 patch; Refill: 3 - acetaminophen-codeine (TYLENOL #3) 300-30 MG tablet; Take 1 tablet at bedtime as needed by mouth for moderate pain.  Dispense: 30 tablet; Refill: 0  2. Essential hypertension Controlled Low-sodium, DASH diet  3. Diabetic polyneuropathy associated with type 2 diabetes mellitus ([place]) Controlled  4. Morbid obesity ([place]) Discussed reducing portion sizes, increasing physical activity Hopefully addition of Byetta should help with weight loss  5. Paroxysmal atrial fibrillation ([facility]) Currently on Eliquis for anticoagulation and metoprolol for rate control  6. Type 2 diabetes mellitus with other specified complication, with long-term current use of insulin ([facility]) Not fully optimized with A1c of 8.7 Comments Byetta which will also help with weight loss Discontinue NovoLog Continue diabetic diet, lifestyle modifications - exenatide (BYETTA 10 MCG PEN) 10 MCG/0.04ML SOPN injection; Inject 0.04 mLs (10 mcg total) 2 (two) times daily with a meal into the skin.  Dispense: 1 pen; Refill: 5   Meds ordered this encounter  Medications  . gabapentin (NEURONTIN) 300 MG capsule    Sig: Take 2 capsules (600 mg total) at bedtime by mouth. When necessary for neuropathy pain    Dispense:  60 capsule    Refill:  3  . tiZANidine (ZANAFLEX) 4 MG capsule    Sig: Take 1 capsule (4 mg total) at bedtime by mouth.    Dispense:  90 capsule    Refill:  3  . lidocaine (LIDODERM) 5 %    Sig: Place 1 patch daily onto the skin. Remove & Discard patch within 12 hours or as directed by MD    Dispense:  30 patch    Refill:  3  . acetaminophen-codeine (TYLENOL #3) 300-30 MG tablet    Sig: Take 1 tablet at bedtime as needed by mouth for moderate pain.      Dispense:  30 tablet    Refill:  0  . exenatide (BYETTA 10 MCG PEN) 10 MCG/0.04ML SOPN  injection    Sig: Inject 0.04 mLs (10 mcg total) 2 (two) times daily with a meal into the skin.    Dispense:  1 pen    Refill:  5    Discontinue Humalog    Follow-up: Return in about 3 months (around 05/18/2017) for Follow-up of chronic medical conditions.   Arnoldo Morale MD

## 2017-02-16 ENCOUNTER — Encounter: Payer: Self-pay | Admitting: Family Medicine

## 2017-02-17 ENCOUNTER — Telehealth: Payer: Self-pay | Admitting: Family Medicine

## 2017-02-17 MED FILL — LIDOCAINE PATCH 5%: 5 | 30 days supply | Qty: 30 | Fill #0

## 2017-02-17 NOTE — Telephone Encounter (Signed)
Noted  

## 2017-02-17 NOTE — Telephone Encounter (Signed)
Lidoderm approved today. Approved #[id number]  Tylenol #3 approved today. Approval # [id number]  Byetta approved today. Pending approval  Called patient and he is aware. He will get his medications today.

## 2017-02-17 NOTE — Telephone Encounter (Signed)
Pt called to request advice on what he should do since his medication cant be made until medicaid approves it.  -lidocaine (LIDODERM) 5 %  -acetaminophen-codeine (TYLENOL #3) 300-30 MG tablet  Please follow up

## 2017-02-22 ENCOUNTER — Other Ambulatory Visit (HOSPITAL_COMMUNITY): Payer: Self-pay | Admitting: Nurse Practitioner

## 2017-02-22 MED FILL — BYETTA 10 MCG DOSE PEN INJ: 10 | 30 days supply | Qty: 2 | Fill #0

## 2017-02-22 MED FILL — $ELIQUIS 5 MG TABLET: 5 | 60 days supply | Qty: 120 | Fill #1 | Status: TO

## 2017-02-23 ENCOUNTER — Ambulatory Visit (INDEPENDENT_AMBULATORY_CARE_PROVIDER_SITE_OTHER): Payer: Medicaid Other

## 2017-02-23 ENCOUNTER — Ambulatory Visit (INDEPENDENT_AMBULATORY_CARE_PROVIDER_SITE_OTHER): Payer: Medicaid Other | Admitting: Orthopaedic Surgery

## 2017-02-23 ENCOUNTER — Encounter (INDEPENDENT_AMBULATORY_CARE_PROVIDER_SITE_OTHER): Payer: Self-pay | Admitting: Orthopaedic Surgery

## 2017-02-23 DIAGNOSIS — M79642 Pain in left hand: Secondary | ICD-10-CM

## 2017-02-23 DIAGNOSIS — M25561 Pain in right knee: Secondary | ICD-10-CM

## 2017-02-23 DIAGNOSIS — G8929 Other chronic pain: Secondary | ICD-10-CM | POA: Diagnosis not present

## 2017-02-23 DIAGNOSIS — M7541 Impingement syndrome of right shoulder: Secondary | ICD-10-CM

## 2017-02-23 NOTE — Progress Notes (Signed)
Office Visit Note   Patient: Jeffery [name]           Date of Birth: [birth year]           MRN: [id number] Visit Date: 02/23/2017              Requested by: No referring provider defined for this encounter. PCP: Jeffery Morale, MD   Assessment & Plan: Visit Diagnoses:  1. Chronic pain of right knee   2. Pain in left hand   3. Impingement syndrome of right shoulder     Plan: Impression is left thumb CMC arthritis.  Thumb abduction orthotic was provided today.  Patient uses lidocaine patches.  In terms of his right shoulder we did discuss arthroscopic debridement and distal clavicle excision with subacromial decompression.  We will check a hemoglobin A1c first to see if this is within acceptable range for surgery.  We will be in touch with the patient regarding the results. Total face to face encounter time was greater than 25 minutes and over half of this time was spent in counseling and/or coordination of care.  Follow-Up Instructions: Return if symptoms worsen or fail to improve.   Orders:  Orders Placed This Encounter  Procedures  . XR Knee 1-2 Views Right  . XR Hand Complete Left   No orders of the defined types were placed in this encounter.     Procedures: No procedures performed   Clinical Data: No additional findings.   Subjective: No chief complaint on file.   Jeffery [name] comes in today for left thumb pain and continued right shoulder.  Denies any numbness and tingling.  His right knee is doing well and is asymptomatic.    Review of Systems  Constitutional: Negative.   All other systems reviewed and are negative.    Objective: Vital Signs: There were no vitals taken for this visit.  Physical Exam  Constitutional: He is oriented to person, place, and time. He appears well-developed and well-nourished.  Pulmonary/Chest: Effort normal.  Abdominal: Soft.  Neurological: He is alert and oriented to person, place, and time.  Skin: Skin is warm.    Psychiatric: He has a normal mood and affect. His behavior is normal. Judgment and thought content normal.  Nursing note and vitals reviewed.   Ortho Exam Right shoulder exam is stable. Left hand exam shows positive grind test.  Negative Finkelstein's. Right knee exam is benign.  Fully healed surgical scar. Specialty Comments:  No specialty comments available.  Imaging: No results found.   PMFS History: Patient Active Problem List   Diagnosis Date Noted  . Diabetes mellitus without complication ([place]) 40/98/1191  . Achilles tendon contracture, right 01/05/2017  . Closed nondisplaced fracture of distal phalanx of right great toe 01/05/2017  . Paroxysmal atrial fibrillation ([place]) 10/30/2016  . Pain in right ankle and joints of right foot 07/09/2016  . Diabetic polyneuropathy associated with type 2 diabetes mellitus ([place]) 07/09/2016  . 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 Impingement syndrome of right shoulder 07/07/2016  . Pain in right foot 07/07/2016  . Charcot's joint of right foot 07/03/2016  . Morbid obesity ([place]) 06/05/2016  . S/P total knee replacement using cement 03/28/2015  . Knee osteoarthritis 11/12/2014  . Hypertension 10/15/2014  . Osteomyelitis of toe of left foot ([place])   . Toe osteomyelitis, left ([place]) 10/01/2014  . Charcot foot due to diabetes mellitus ([place]) 10/01/2014  . Accelerated hypertension 10/01/2014  . Gangrene  left third toe 10/01/2014  . SHOULDER JOINT INSTABILITY 05/27/2010  . SHOULDER PAIN, RIGHT 05/27/2010   Past Medical History:  Diagnosis Date  . Atrial fibrillation ([place])   . DDD (degenerative disc disease), lumbar   . Diabetes mellitus without complication ([place])   . Hypertension   . Rotator cuff disorder     Family History  Problem Relation Age of Onset  . Diabetes Father   . Hypertension Father   . Heart failure Father     Past Surgical History:  Procedure Laterality Date   . AMPUTATION Left 10/02/2014   Procedure: Left Third toe amputation ;  Surgeon: [name], MD;  Location: [facility];  Service: Orthopedics;  Laterality: Left;  Regular bed, wants to follow hip  . ANTERIOR CRUCIATE LIGAMENT REPAIR Right 90   reconstruction  . APPLICATION OF WOUND VAC Left 10/02/2014   Procedure: APPLICATION OF WOUND VAC; toe Surgeon: [name], MD;  Location: [facility];  Service: Orthopedics;  Laterality: Left;  . I&D EXTREMITY Left 10/05/2014   Procedure: IRRIGATION AND DEBRIDEMENT LEFT FOOT;  Surgeon: [name], MD;  Location: [facility];  Service: Orthopedics;  Laterality: Left;  . KNEE ARTHROSCOPY W/ ACL RECONSTRUCTION Right   . TOTAL KNEE ARTHROPLASTY Right 03/28/2015  . TOTAL KNEE ARTHROPLASTY Right 03/28/2015   Procedure: RIGHT TOTAL KNEE ARTHROPLASTY;  Surgeon: [name], MD;  Location: [facility];  Service: Orthopedics;  Laterality: Right;   Social History   Occupational History  . Occupation: Management consultant  Tobacco Use  . Smoking status: Former Smoker    Packs/day: 0.00    Years: 38.00    Pack years: 0.00    Types: Cigarettes  . Smokeless tobacco: Never Used  . Tobacco comment: no smoking in 60 days  Substance and Sexual Activity  . Alcohol use: No    Alcohol/week: 3.0 - 3.6 oz    Types: 5 - 6 Cans of beer per week    Comment: no drinks in 60 days  . Drug use: Yes    Types: Marijuana    Comment: last week  . Sexual activity: Not on file

## 2017-02-23 NOTE — Addendum Note (Signed)
Addended by: Precious Bard on: 02/23/2017 02:21 PM   Modules accepted: Orders

## 2017-02-24 LAB — TIQ-NTM

## 2017-02-24 LAB — HEMOGLOBIN A1C
Hgb A1c MFr Bld: 8 % of total Hgb — ABNORMAL HIGH (ref <5.7)
Mean Plasma Glucose: 183 (calc)
eAG (mmol/L): 10.1 (calc)

## 2017-02-24 NOTE — Progress Notes (Signed)
Please let him know that we can proceed with scheduling surgery for his shoulder.

## 2017-02-26 ENCOUNTER — Ambulatory Visit (INDEPENDENT_AMBULATORY_CARE_PROVIDER_SITE_OTHER): Payer: Self-pay | Admitting: Orthopaedic Surgery

## 2017-02-26 DIAGNOSIS — M545 Low back pain: Secondary | ICD-10-CM | POA: Diagnosis not present

## 2017-02-26 DIAGNOSIS — M5430 Sciatica, unspecified side: Secondary | ICD-10-CM | POA: Diagnosis not present

## 2017-02-26 DIAGNOSIS — G603 Idiopathic progressive neuropathy: Secondary | ICD-10-CM | POA: Diagnosis not present

## 2017-02-26 DIAGNOSIS — G894 Chronic pain syndrome: Secondary | ICD-10-CM | POA: Diagnosis not present

## 2017-02-26 DIAGNOSIS — M4327 Fusion of spine, lumbosacral region: Secondary | ICD-10-CM | POA: Diagnosis not present

## 2017-03-01 ENCOUNTER — Telehealth: Payer: Self-pay | Admitting: Cardiovascular Disease

## 2017-03-01 NOTE — Telephone Encounter (Signed)
Called the patient and LVM to call back to reschedule his appointment.

## 2017-03-03 ENCOUNTER — Telehealth (INDEPENDENT_AMBULATORY_CARE_PROVIDER_SITE_OTHER): Payer: Self-pay | Admitting: Orthopaedic Surgery

## 2017-03-03 NOTE — Telephone Encounter (Signed)
Message sent in error

## 2017-03-04 ENCOUNTER — Encounter: Payer: Self-pay | Admitting: Cardiovascular Disease

## 2017-03-04 ENCOUNTER — Ambulatory Visit (INDEPENDENT_AMBULATORY_CARE_PROVIDER_SITE_OTHER): Payer: Medicaid Other | Admitting: Cardiovascular Disease

## 2017-03-04 VITALS — HR 92 | Ht 75.0 in | Wt 316.0 lb

## 2017-03-04 DIAGNOSIS — Z79899 Other long term (current) drug therapy: Secondary | ICD-10-CM

## 2017-03-04 DIAGNOSIS — I480 Paroxysmal atrial fibrillation: Secondary | ICD-10-CM | POA: Diagnosis not present

## 2017-03-04 DIAGNOSIS — I10 Essential (primary) hypertension: Secondary | ICD-10-CM

## 2017-03-04 DIAGNOSIS — R0683 Snoring: Secondary | ICD-10-CM

## 2017-03-04 DIAGNOSIS — E785 Hyperlipidemia, unspecified: Secondary | ICD-10-CM | POA: Diagnosis not present

## 2017-03-04 DIAGNOSIS — Z7901 Long term (current) use of anticoagulants: Secondary | ICD-10-CM

## 2017-03-04 MED ORDER — METOPROLOL TARTRATE 50 MG PO TABS: 75.0000 mg | ORAL_TABLET | Freq: Two times a day (BID) | ORAL | 3 refills | Status: DC

## 2017-03-04 NOTE — Progress Notes (Signed)
Cardiology Office Note    Date:  03/05/2017   ID:  Jeffery [name], DOB 08/08/1960, MRN [id number]  PCP:  Arnoldo Morale, MD  Cardiologist:  Shelva Majestic, MD   Chief Complaint  Patient presents with  . Follow-up    NP.   New establishment of cardiology care with me.  History of Present Illness:  Jeffery [name] is a 56 y.o. male who is the son of my former patient Jeffery [name].  He has experienced 2 episodes of atrial fibrillation over the past 7 months and is referred from the atrial fibrillation clinic to establish cardiology care with me.  Jeffery [name] has a history of obesity, diabetes mellitus, and hypertension.  He previously had a significant EtOH history and was drinking up to 4 cases of beer per week.  In May after having significant amount of beer the night before he woke up in the morning feeling weak.  He presented and was found to be in atrial fibrillation of new onset.  He was placed on Lopressor and eliquis and underwent successful cardioversion.  His chads 2 Vascor was to.  He was seen in A. fib clinic in follow-up on 08/27/2016 and was in sinus rhythm with PACs.  An echo Doppler study showed an EF of 50-55% with grade 1 diastolic dysfunction.  His left atrium was moderately dilated.  There was trivial MR.  He had done well maintaining sinus rhythm until August when again he had another alcohol binge and was back in atrial fibrillation.  He had been having difficulty with his back and had been off eliquis a week earlier to allow for treatment with ibuprofen.  He again was cardioverted for his A. fib with a rate of 169 on presentation and eliquis was resumed.  The patient has not had any alcohol since that presentation.  He continues to have significant difficulty with his back.  He denies any chest tightness or chest pressure.  He is unable to exercise due to his back discomfort.  He is now on atorvastatin for hyperlipidemia, and losartan 50 mg and metoprolol 50 mg twice  a day for hypertension.  He continues to be on eliquis for anticoagulation.  Several days ago he ran out of his metoprolol.  Upon further questioning, the patient snores.  He gets up at least 2-3 times per night.  An Epworth Sleepiness Scale score was calculated in the office today and this endorsed at 5.  He presents to establish care with me.   Past Medical History:  Diagnosis Date  . Atrial fibrillation ([place])   . DDD (degenerative disc disease), lumbar   . Diabetes mellitus without complication ([place])   . Hypertension   . Rotator cuff disorder     Past Surgical History:  Procedure Laterality Date  . AMPUTATION Left 10/02/2014   Procedure: Left Third toe amputation ;  Surgeon: [name], MD;  Location: [facility];  Service: Orthopedics;  Laterality: Left;  Regular bed, wants to follow hip  . ANTERIOR CRUCIATE LIGAMENT REPAIR Right 90   reconstruction  . APPLICATION OF WOUND VAC Left 10/02/2014   Procedure: APPLICATION OF WOUND VAC; toe Surgeon: [name], MD;  Location: [facility];  Service: Orthopedics;  Laterality: Left;  . I&D EXTREMITY Left 10/05/2014   Procedure: IRRIGATION AND DEBRIDEMENT LEFT FOOT;  Surgeon: [name], MD;  Location: [facility];  Service: Orthopedics;  Laterality: Left;  . KNEE ARTHROSCOPY W/ ACL RECONSTRUCTION Right   . TOTAL  KNEE ARTHROPLASTY Right 03/28/2015  . TOTAL KNEE ARTHROPLASTY Right 03/28/2015   Procedure: RIGHT TOTAL KNEE ARTHROPLASTY;  Surgeon: [name], MD;  Location: [facility];  Service: Orthopedics;  Laterality: Right;    Current Medications: Outpatient Medications Prior to Visit  Medication Sig Dispense Refill  . apixaban (ELIQUIS) 5 MG TABS tablet Take 1 tablet (5 mg total) by mouth 2 (two) times daily. 180 tablet 3  . atorvastatin (LIPITOR) 20 MG tablet Take 1 tablet (20 mg total) by mouth daily. 30 tablet 3  . Blood Glucose Monitoring Suppl (TRUE METRIX METER) DEVI 1 each by Does not apply route 3 (three) times daily before meals. 1 Device 0  .  exenatide (BYETTA 10 MCG PEN) 10 MCG/0.04ML SOPN injection Inject 0.04 mLs (10 mcg total) 2 (two) times daily with a meal into the skin. 1 pen 5  . gabapentin (NEURONTIN) 300 MG capsule Take 2 capsules (600 mg total) at bedtime by mouth. When necessary for neuropathy pain 60 capsule 3  . glucose blood (TRUE METRIX BLOOD GLUCOSE TEST) test strip Use 3 times daily before meals 100 each 12  . ibuprofen (ADVIL,MOTRIN) 200 MG tablet Take 200 mg by mouth every 6 (six) hours as needed for moderate pain.    . Insulin Glargine (LANTUS SOLOSTAR) 100 UNIT/ML Solostar Pen 34 units in the morning and at bedtime 5 pen 3  . Insulin Pen Needle (B-D ULTRAFINE III SHORT PEN) 31G X 8 MM MISC 1 each by Does not apply route 3 (three) times daily. 100 each 5  . Insulin Syringe-Needle U-100 (TRUEPLUS INSULIN SYRINGE) 30G X 5/16" 0.5 ML MISC Use as directed 3 times daily 100 each 5  . lidocaine (LIDODERM) 5 % Place 1 patch daily onto the skin. Remove & Discard patch within 12 hours or as directed by MD 30 patch 3  . losartan (COZAAR) 50 MG tablet Take 1 tablet (50 mg total) by mouth daily. 30 tablet 5  . oxyCODONE-acetaminophen (PERCOCET/ROXICET) 5-325 MG tablet Take 3 tablets by mouth daily. Takes one tablet in the morning, 1 tablet at noon, and 1 tablet at night.    Karen Chafe INSULIN SYRINGE 30G X 5/16" 0.5 ML MISC USE AS DIRECTED 3 TIMES DAILY 100 each 0  . TRUEPLUS LANCETS 28G MISC 1 each by Does not apply route 3 (three) times daily before meals. 100 each 12  . metoprolol tartrate (LOPRESSOR) 50 MG tablet TAKE 1 TABLET BY MOUTH 2 TIMES DAILY. 60 tablet 3  . acetaminophen-codeine (TYLENOL #3) 300-30 MG tablet Take 1 tablet at bedtime as needed by mouth for moderate pain. 30 tablet 0  . doxycycline (VIBRA-TABS) 100 MG tablet Take 1 tablet (100 mg total) by mouth 2 (two) times daily. 20 tablet 0  . tiZANidine (ZANAFLEX) 4 MG capsule Take 1 capsule (4 mg total) at bedtime by mouth. 90 capsule 3   No facility-administered  medications prior to visit.      Allergies:   Patient has no known allergies.   Social History   Socioeconomic History  . Marital status: Single    Spouse name: None  . Number of children: None  . Years of education: None  . Highest education level: None  Social Needs  . Financial resource strain: None  . Food insecurity - worry: None  . Food insecurity - inability: None  . Transportation needs - medical: None  . Transportation needs - non-medical: None  Occupational History  . Occupation: Management consultant  Tobacco  Use  . Smoking status: Former Smoker    Packs/day: 0.00    Years: 38.00    Pack years: 0.00    Types: Cigarettes  . Smokeless tobacco: Never Used  . Tobacco comment: no smoking in 60 days  Substance and Sexual Activity  . Alcohol use: No    Alcohol/week: 3.0 - 3.6 oz    Types: 5 - 6 Cans of beer per week    Comment: no drinks in 60 days  . Drug use: Yes    Types: Marijuana    Comment: last week  . Sexual activity: None  Other Topics Concern  . None  Social History Narrative   Lives alone.    Socially, never married.  He has no children.  He is currently living with his mother.  He is not working.  Previously had done jobs.  Family History:  The patient's family history includes Diabetes in his father; Heart failure in his father; Hypertension in his father.  His father died at age 25.  Mother is still living at age 53.  ROS General: Negative; No fevers, chills, or night sweats;  HEENT: Negative; No changes in vision or hearing, sinus congestion, difficulty swallowing Pulmonary: Negative; No cough, wheezing, shortness of breath, hemoptysis Cardiovascular: Negative; No chest pain, presyncope, syncope, palpitations GI: Negative; No nausea, vomiting, diarrhea, or abdominal pain GU: Negative; No dysuria, hematuria, or difficulty voiding Musculoskeletal: Negative; no myalgias, joint pain, or weakness Hematologic/Oncology: Negative; no easy  bruising, bleeding Endocrine: Negative; no heat/cold intolerance; no diabetes Neuro: Negative; no changes in balance, headaches Skin: Negative; No rashes or skin lesions Psychiatric: Negative; No behavioral problems, depression Sleep: Positive for snoring, daytime sleepiness, hypersomnolence, bruxism, restless legs, hypnogognic hallucinations, no cataplexy Other comprehensive 14 point system review is negative.   PHYSICAL EXAM:   Pulse 92   Ht 6' 3"  (1.905 m)   Wt (!) 316 lb (143.3 kg)   BMI 39.50 kg/m   Repeat blood pressure by me was 130/70 and when taken by the nurse previously was 142/78.  Wt Readings from Last 3 Encounters:  03/04/17 (!) 316 lb (143.3 kg)  02/15/17 (!) 319 lb 6.4 oz (144.9 kg)  02/08/17 (!) 313 lb (142 kg)    General: Alert, oriented, no distress.  Skin: normal turgor, no rashes, warm and dry HEENT: Normocephalic, atraumatic. Pupils equal round and reactive to light; sclera anicteric; extraocular muscles intact; fundi without hemorrhages or exudates. Nose without nasal septal hypertrophy Mouth/Parynx benign; Mallinpatti scale 3 Neck: Thick neck.  No JVD, no carotid bruits; normal carotid upstroke Lungs: clear to ausculatation and percussion; no wheezing or rales Chest wall: without tenderness to palpitation Heart: PMI not displaced, RRR with occasional premature beats, s1 s2 normal, 1/6 systolic murmur, no diastolic murmur, no rubs, gallops, thrills, or heaves  Abdomen: Moderate central adiposity soft, nontender; no hepatosplenomehaly, BS+; abdominal aorta nontender and not dilated by palpation. Back: no CVA tenderness Pulses 2+ Musculoskeletal: full range of motion, normal strength, no joint deformities Extremities: no clubbing cyanosis or edema, Homan's sign negative  Neurologic: grossly nonfocal; Cranial nerves grossly wnl Psychologic: Normal mood and affect   Studies/Labs Reviewed:   EKG:  EKG is ordered today. ECG (independently read by me): Sinus  rhythm in the 90s with PACs.  QTc interval 442 ms.  Recent Labs: BMP Latest Ref Rng & Units 11/04/2016 11/01/2016 08/04/2016  Glucose 65 - 99 mg/dL 152(H) 314(H) 219(H)  BUN 6 - 24 mg/dL 12 13 16   Creatinine 0.76 -  1.27 mg/dL 0.80 0.85 0.89  BUN/Creat Ratio 9 - 20 15 - -  Sodium 134 - 144 mmol/L 140 134(L) 135  Potassium 3.5 - 5.2 mmol/L 5.1 4.4 4.5  Chloride 96 - 106 mmol/L 103 105 105  CO2 20 - 29 mmol/L 22 22 22   Calcium 8.7 - 10.2 mg/dL 9.6 9.3 9.0     Hepatic Function Latest Ref Rng & Units 11/04/2016 05/14/2016 08/11/2015  Total Protein 6.0 - 8.5 g/dL 7.2 7.2 7.9  Albumin 3.5 - 5.5 g/dL 4.0 3.9 4.0  AST 0 - 40 IU/L 15 19 22   ALT 0 - 44 IU/L 22 24 31   Alk Phosphatase 39 - 117 IU/L 79 89 78  Total Bilirubin 0.0 - 1.2 mg/dL 0.4 0.4 1.1    CBC Latest Ref Rng & Units 11/01/2016 08/04/2016 05/14/2016  WBC 4.0 - 10.5 K/uL 11.2(H) 11.4(H) 8.8  Hemoglobin 13.0 - 17.0 g/dL 15.6 13.7 15.5  Hematocrit 39.0 - 52.0 % 45.6 41.8 46.8  Platelets 150 - 400 K/uL 250 251 220   Lab Results  Component Value Date   MCV 83.5 11/01/2016   MCV 85.8 08/04/2016   MCV 87.3 05/14/2016   Lab Results  Component Value Date   TSH 3.348 08/11/2016   Lab Results  Component Value Date   HGBA1C 8.0 (H) 02/23/2017     BNP No results found for: BNP  ProBNP No results found for: PROBNP   Lipid Panel     Component Value Date/Time   CHOL 191 11/04/2016 0833   TRIG 205 (H) 11/04/2016 0833   HDL 42 11/04/2016 0833   CHOLHDL 4.5 11/04/2016 0833   CHOLHDL 3.8 04/10/2010 0315   VLDL 32 04/10/2010 0315   LDLCALC 108 (H) 11/04/2016 0833     RADIOLOGY: Xr Hand Complete Left  Result Date: 02/23/2017 Advanced thumb basal joint arthritis  Xr Knee 1-2 Views Right  Result Date: 02/23/2017 Stable right total knee replacement.  Moderate to severe left knee degenerative joint disease    Additional studies/ records that were reviewed today include:  I reviewed the patient's atrial fibrillation clinic  records as well as his ER visits.  ------------------------------------------------------------------- 08/18/2016 ECHO Study Conclusions  - Left ventricle: The cavity size was mildly dilated. Wall   thickness was increased in a pattern of mild LVH. Systolic   function was normal. The estimated ejection fraction was in the   range of 50% to 55%. Wall motion was normal; there were no   regional wall motion abnormalities. Doppler parameters are   consistent with abnormal left ventricular relaxation (grade 1   diastolic dysfunction). - Aortic valve: Transvalvular velocity was within the normal range.   There was no stenosis. There was no regurgitation. - Mitral valve: Transvalvular velocity was within the normal range.   There was no evidence for stenosis. There was trivial   regurgitation. - Left atrium: The atrium was moderately dilated. - Right ventricle: The cavity size was normal. Wall thickness was   normal. Systolic function was normal. - Tricuspid valve: There was no regurgitation.    ASSESSMENT:    1. Paroxysmal atrial fibrillation (HCC)   2. Essential hypertension   3. Medication management   4. Hyperlipidemia with target LDL less than 70   5. Snoring   6. Morbid obesity (Jeffery [name])   7. Anticoagulation adequate      PLAN:  Jeffery [name] is a 71-year-old gentleman who has a long-standing history of obesity, and has been on diabetic medication since 2016.  In addition, has a history of hypertension and hyperlipidemia.  He previously had significant EtOH history and his 2 recent episodes of atrial fibrillation occurred after significant alcohol binging.  He is not had any alcohol since his last cardioversion in August.  He is morbidly obese diabetic male with a BMI of 39.5.  His blood pressure today on repeat by me was 130/70.  On his regimen of losartan and metoprolol, but he had skipped several doses of metoprolol since he had run out of his medication.  When he was seen  in the [facility] most recently, his heart rate was increased on metoprolol 50 mg twice a day.  His ECG today shows PACs with a ventricular rate in the 90s.  I am recommending further titration of metoprolol to 75 mg twice a day.  He continues to be on eliquis for anticoagulation and there is no bleeding.  His echo Doppler study has shown an EF of 50-55% with grade 1 diastolic dysfunction and moderate left atrial dilatation.  His chads 2 Vascor is to by virtue of hypertension and diabetes mellitus.  Laboratory in 11/04/2016 showed a total cholesterol 191, HDL 42, LDL 108, and triglycerides 205.  At that time, he was started on atorvastatin 20 mg and has not had follow-up laboratory since initiation.  We discussed the importance of weight loss and proper diet.  I also discussed the potential for sleep apnea contributing to his atrial fibrillation.  He has a thick neck, morbid obesity, snores loudly, and at times sleep is nonrestorative.  I will schedule him for a sleep study for further evaluation.  He has continued to have difficulty with his back which has limited his exercise.  He also states he has developed a Charcot foot on the right.  We discussed the importance of continued alcohol cessation.  I am scheduling him for repeat laboratory including chemistry profile, magnesium, CBC, TSH, and lipid studies.  I will see him in 3 months for reevaluation.   Medication Adjustments/Labs and Tests Ordered: Current medicines are reviewed at length with the patient today.  Concerns regarding medicines are outlined above.  Medication changes, Labs and Tests ordered today are listed in the Patient Instructions below. Patient Instructions  Medication Instructions:  INCREASE metoprolol to 75 mg (1.5 tablets) two times daily  Labwork: Please return for FASTING labs (CMET, CBC, Lipid, TSH, Mag)  Testing/Procedures: Your physician has recommended that you have a sleep study. This test records several body  functions during sleep, including: brain activity, eye movement, oxygen and carbon dioxide blood levels, heart rate and rhythm, breathing rate and rhythm, the flow of air through your mouth and nose, snoring, body muscle movements, and chest and belly movement.  Follow-Up: Your physician recommends that you schedule a follow-up appointment in: 3 months with Dr. Claiborne Billings.    Any Other Special Instructions Will Be Listed Below (If Applicable).     If you need a refill on your cardiac medications before your next appointment, please call your pharmacy.      Signed, Shelva Majestic, MD  03/05/2017 7:44 AM    [facility] [address] [place], [place],   [address] Phone: [phone]

## 2017-03-04 NOTE — Patient Instructions (Signed)
Medication Instructions:  INCREASE metoprolol to 75 mg (1.5 tablets) two times daily  Labwork: Please return for FASTING labs (CMET, CBC, Lipid, TSH, Mag)  Testing/Procedures: Your physician has recommended that you have a sleep study. This test records several body functions during sleep, including: brain activity, eye movement, oxygen and carbon dioxide blood levels, heart rate and rhythm, breathing rate and rhythm, the flow of air through your mouth and nose, snoring, body muscle movements, and chest and belly movement.  Follow-Up: Your physician recommends that you schedule a follow-up appointment in: 3 months with Dr. Claiborne Billings.    Any Other Special Instructions Will Be Listed Below (If Applicable).     If you need a refill on your cardiac medications before your next appointment, please call your pharmacy.

## 2017-03-05 ENCOUNTER — Encounter: Payer: Self-pay | Admitting: Cardiovascular Disease

## 2017-03-05 MED FILL — LOSARTAN POTASSIUM 50 MG TA: 50 | 30 days supply | Qty: 30 | Fill #4

## 2017-03-05 MED FILL — ATORVASTATIN 20 MG TABLET: 20 | 30 days supply | Qty: 30 | Fill #1

## 2017-03-05 MED FILL — LANTUS SOLOSTAR 100 UNITS/M: 100 | 22 days supply | Qty: 15 | Fill #3

## 2017-03-05 MED FILL — METOPROLOL TARTRATE 50 MG T: 50 | 30 days supply | Qty: 90 | Fill #0

## 2017-03-09 ENCOUNTER — Telehealth (INDEPENDENT_AMBULATORY_CARE_PROVIDER_SITE_OTHER): Payer: Self-pay | Admitting: Orthopaedic Surgery

## 2017-03-09 NOTE — Telephone Encounter (Signed)
Patient wants a call back regarding his right shoulder. CB # [phone]

## 2017-03-09 NOTE — Telephone Encounter (Signed)
Called him back. He said he needed to talk to sherrie and she had already spoken to her.

## 2017-03-19 ENCOUNTER — Other Ambulatory Visit (INDEPENDENT_AMBULATORY_CARE_PROVIDER_SITE_OTHER): Payer: Self-pay | Admitting: Orthopaedic Surgery

## 2017-03-19 ENCOUNTER — Telehealth: Payer: Self-pay | Admitting: Cardiovascular Disease

## 2017-03-19 DIAGNOSIS — M7541 Impingement syndrome of right shoulder: Secondary | ICD-10-CM

## 2017-03-19 NOTE — Telephone Encounter (Signed)
°  Jeffery [name]     [facility] Pre-operative Risk Assessment    Request for surgical clearance:  1. What type of surgery is being performed?  R Shoulder Arthroscopy   2. When is this surgery scheduled?  03/26/17  3. Are there any medications that need to be held prior to surgery and how long?  Requesting instructions to hold Eliquis (please state how many days).  4. Practice name and name of physician performing surgery?  [facility] Physician: Dr. Eduard Roux  5. What is your office phone and fax number?  Phone: [phone] Fax: [phone]  6. Anesthesia type (None, local, MAC, general) ? General & a block   Jeffery [name] 03/19/2017, 3:43 PM  _________________________________________________________________ Verifying patient is safe to undergo this surgery

## 2017-03-24 ENCOUNTER — Other Ambulatory Visit: Payer: Self-pay

## 2017-03-24 ENCOUNTER — Other Ambulatory Visit: Payer: Self-pay | Admitting: Physician Assistant

## 2017-03-24 ENCOUNTER — Encounter (HOSPITAL_COMMUNITY): Payer: Self-pay | Admitting: *Deleted

## 2017-03-24 DIAGNOSIS — E118 Type 2 diabetes mellitus with unspecified complications: Principal | ICD-10-CM

## 2017-03-24 DIAGNOSIS — IMO0002 Reserved for concepts with insufficient information to code with codable children: Secondary | ICD-10-CM

## 2017-03-24 DIAGNOSIS — E1165 Type 2 diabetes mellitus with hyperglycemia: Secondary | ICD-10-CM

## 2017-03-24 DIAGNOSIS — Z794 Long term (current) use of insulin: Principal | ICD-10-CM

## 2017-03-24 MED FILL — BYETTA 10 MCG DOSE PEN INJ: 10 | 30 days supply | Qty: 2 | Fill #1

## 2017-03-24 MED FILL — LANTUS SOLOSTAR 100 UNITS/M: 100 | 22 days supply | Qty: 15 | Fill #0

## 2017-03-24 MED FILL — tiZANidine HCL 4 MG TABS: 4 | 30 days supply | Qty: 30 | Fill #1

## 2017-03-24 NOTE — Telephone Encounter (Signed)
   Primary Cardiologist: No primary care provider on file.  Chart reviewed as part of pre-operative protocol coverage. Patient was contacted 03/24/2017 in reference to pre-operative risk assessment for pending surgery as outlined below.  Jeffery [name] was last seen on 03/04/2017 by Dr. Claiborne Billings.  Since that day, Jeffery [name] has done well.  Therefore, based on ACC/AHA guidelines, the patient would be at acceptable risk for the planned procedure without further cardiovascular testing.   I will route this recommendation to the requesting party via Epic fax function and remove from pre-op pool.  Please call with questions.  Will defer to clinical pharmacist regarding eliquis, note surgery is on 12/28, talking with the patient he began holding eliquis after yesterday. Last dose of eliquis was PM of 03/23/2017  Almyra Deforest, PA 03/24/2017, 2:50 PM

## 2017-03-24 NOTE — Telephone Encounter (Signed)
Jeffery [name], [facility] has given per office protocol ok to hold Eliquis x 3 days. See note from [place], [place]. I will fax clearance to [facility] Dr. Eduard Roux.

## 2017-03-24 NOTE — Telephone Encounter (Signed)
Patient with diagnosis of Afib on Eliquis for anticoagulation.    Procedure: shoulder arthroplasty with spinal nerve block Date of procedure: 03/26/17  CHADS2-VASc score of  3 ( CHF - some diastolic dysfunction noted , HTN, AGE, DM2, stroke/tia x 2, CAD, AGE, male)  CrCl >128ml/min  Per office protocol, patient can hold Eliquis for 3 days prior to procedure.

## 2017-03-24 NOTE — Progress Notes (Signed)
Pt denies SOB and chest pain. Pt under the care of Dr. Claiborne Billings, Cardiology. Pt denies having a stress test and cardiac cath. Pt denies having recent labs. Pt made aware to take half dose of Lantus insulin the night before surgery (17 unist) and half (17 units) the DOS if blood glucose is > 70. Pt made aware to check BG every 2 hours prior to arrival to hospital on DOS. Pt made aware to treat a BG < 70 with  4 ounces of apple juice, wait 15 minutes after intervention to recheck BG, if BG remains < 70, call Short Stay unit to speak with a nurse. Pt made aware to stop taking vitamins, fish oil and herbal medications. Do not take any NSAIDs ie: Ibuprofen, Advil, Naproxen (Aleve), Motrin, BC and Goody Powder. Pt stated that last dose of Eliquis was evening of 03/23/17. Anesthesia asked to review cardiology note. Resident stated that he is expected to have a sleep study performed in the future. Pt verbalized understanding of all pre-op instructions.

## 2017-03-25 ENCOUNTER — Encounter: Payer: Self-pay | Admitting: Physical Therapy

## 2017-03-25 ENCOUNTER — Ambulatory Visit: Payer: Medicaid Other | Attending: Anesthesiology | Admitting: Physical Therapy

## 2017-03-25 DIAGNOSIS — M6281 Muscle weakness (generalized): Secondary | ICD-10-CM | POA: Insufficient documentation

## 2017-03-25 DIAGNOSIS — M5440 Lumbago with sciatica, unspecified side: Secondary | ICD-10-CM | POA: Insufficient documentation

## 2017-03-25 DIAGNOSIS — G8929 Other chronic pain: Secondary | ICD-10-CM | POA: Insufficient documentation

## 2017-03-25 DIAGNOSIS — R293 Abnormal posture: Secondary | ICD-10-CM | POA: Insufficient documentation

## 2017-03-25 MED ORDER — CEFAZOLIN SODIUM 10 G IJ SOLR
3.0000 g | INTRAMUSCULAR | Status: AC
  Administered 2017-03-26: 3 g via INTRAVENOUS
  Filled 2017-03-25: qty 3

## 2017-03-25 NOTE — Patient Instructions (Signed)
Sleeping on Back  Place pillow under knees. A pillow with cervical support and a roll around waist are also helpful. Copyright  VHI. All rights reserved.  Sleeping on Side Place pillow between knees. Use cervical support under neck and a roll around waist as needed. Copyright  VHI. All rights reserved.   Sleeping on Stomach   If this is the only desirable sleeping position, place pillow under lower legs, and under stomach or chest as needed.  Posture - Sitting   Sit upright, head facing forward. Try using a roll to support lower back. Keep shoulders relaxed, and avoid rounded back. Keep hips level with knees. Avoid crossing legs for long periods. Stand to Sit / Sit to Stand   To sit: Bend knees to lower self onto front edge of chair, then scoot back on seat. To stand: Reverse sequence by placing one foot forward, and scoot to front of seat. Use rocking motion to stand up.   Work Height and Reach  Ideal work height is no more than 2 to 4 inches below elbow level when standing, and at elbow level when sitting. Reaching should be limited to arm's length, with elbows slightly bent.  Bending  Bend at hips and knees, not back. Keep feet shoulder-width apart.    Posture - Standing   Good posture is important. Avoid slouching and forward head thrust. Maintain curve in low back and align ears over shoul- ders, hips over ankles.  Alternating Positions   Alternate tasks and change positions frequently to reduce fatigue and muscle tension. Take rest breaks. Computer Work   Position work to face forward. Use proper work and seat height. Keep shoulders back and down, wrists straight, and elbows at right angles. Use chair that provides full back support. Add footrest and lumbar roll as needed.  Getting Into / Out of Car  Lower self onto seat, scoot back, then bring in one leg at a time. Reverse sequence to get out.  Dressing  Lie on back to pull socks or slacks over feet, or sit  and bend leg while keeping back straight.    Housework - Sink  Place one foot on ledge of cabinet under sink when standing at sink for prolonged periods.   Pushing / Pulling  Pushing is preferable to pulling. Keep back in proper alignment, and use leg muscles to do the work.  Deep Squat   Squat and lift with both arms held against upper trunk. Tighten stomach muscles without holding breath. Use smooth movements to avoid jerking.  Avoid Twisting   Avoid twisting or bending back. Pivot around using foot movements, and bend at knees if needed when reaching for articles.  Carrying Luggage   Distribute weight evenly on both sides. Use a cart whenever possible. Do not twist trunk. Move body as a unit.   Lifting Principles .Maintain proper posture and head alignment. .Slide object as close as possible before lifting. .Move obstacles out of the way. .Test before lifting; ask for help if too heavy. .Tighten stomach muscles without holding breath. .Use smooth movements; do not jerk. .Use legs to do the work, and pivot with feet. .Distribute the work load symmetrically and close to the center of trunk. .Push instead of pull whenever possible.   Ask For Help   Ask for help and delegate to others when possible. Coordinate your movements when lifting together, and maintain the low back curve.  Log Roll   Lying on back, bend left knee and place left   arm across chest. Roll all in one movement to the right. Reverse to roll to the left. Always move as one unit. Housework - Sweeping  Use long-handled equipment to avoid stooping.   Housework - Wiping  Position yourself as close as possible to reach work surface. Avoid straining your back.  Laundry - Unloading Wash   To unload small items at bottom of washer, lift leg opposite to arm being used to reach.  [place] close to area to be raked. Use arm movements to do the work. Keep back straight and avoid  twisting.     Cart  When reaching into cart with one arm, lift opposite leg to keep back straight.   Getting Into / Out of Bed  Lower self to lie down on one side by raising legs and lowering head at the same time. Use arms to assist moving without twisting. Bend both knees to roll onto back if desired. To sit up, start from lying on side, and use same move-ments in reverse. Housework - Vacuuming  Hold the vacuum with arm held at side. Step back and forth to move it, keeping head up. Avoid twisting.   Laundry - IT consultant so that bending and twisting can be avoided.   Laundry - Unloading Dryer  Squat down to reach into clothes dryer or use a reacher.  Gardening - Weeding / Probation officer or Kneel. Knee pads may be helpful.                    Bridge   Lie back, legs bent. Inhale, pressing hips up. Keeping ribs in, lengthen lower back. Exhale, rolling down along spine from top. Repeat _15 x2___ times. Do __1-2__ sessions per day. Gluteal strength   http://orth.exer.us/134   Copyright  VHI. All rights reserved. Knee to Chest (Flexion)   Pull knee toward chest. Feel stretch in lower back or buttock area. Breathing deeply, Hold _5___ seconds. Repeat with other knee. Repeat __10__ times. Do 1-2____ sessions per day.  http://[email or web address].exer.us/225   Copyright  VHI. All rights reserved.   Lower Trunk Rotation Stretch   Keeping back flat and feet together, rotate knees to left side. Hold _30___ seconds. Repeat __3__ times per set. Do __1__ sets per session. Do _1-2_ sessions per day.  http://orth.exer.us/122   Pelvic Tilt    Flatten back by tightening stomach muscles and buttocks. Repeat __10__ times per set. Do _1___ sets per session. Do __2-3__ sessions per day. You can do this sitting as well.  http://orth.exer.us/134     Copyright  VHI. All rights reserved.  Supine: Leg Stretch With Strap (Basic)   Lie on back  with one knee bent, foot flat on floor. Hook strap around other foot. Straighten knee. Keep knee level with other knee. Hold 30___ seconds. Relax leg completely down to floor.  Repeat 3___ times per session. Do _2__ sessions per day. Also helpful if you " floss " your nerve as shown in clinic.  Bend ankle up and down 10 -15 times     http://gt2.exer.us/247   Copyright  VHI. All rights reserved.  Straight Leg Calf Stretch (Gastroc)   Put palms against wall, one leg forward and bent. With other leg back straight and heel flat on floor, lean into wall. Hold __20-30__ seconds. Change legs and repeat. Repeat _3___ times. Do ___1-2_ sessions per day.  http://[email or web address].exer.us/419   Copyright  VHI. All rights reserved.   Back Wall  Slide   With feet _12___ inches from wall, lean as much of back against the wall as possible. Gently squat down _12__ inches, keeping back against wall. Hold __5__ seconds while counting out loud. Repeat __10__ times. Do _1-2___ sessions per day.  http://[email or web address].exer.us/563   Copyright  VHI. All rights reserved.  Leg Extension (Hamstring)    hamstring variation from above.  Sit toward front edge of chair, with leg out straight, heel on floor, toes pointing toward body. Keeping back straight, bend forward at hip, breathing out through pursed lips. Return, breathing in. Repeat __3_ times. Repeat with other leg hold 30 seconds. Do 1-2___ sessions per day. Variation: Perform from standing position, with support.  Wall Lean Stretch   Not like picture.  With right hand against wall, slowly stretch hips toward wall, other arm supporting trunk. Hold _20-30 ___ seconds. Relax. Repeat __2__ times per set try to do every 1 to 2 hours while up. And not sleeping.    Jeffery [name], PT Certified Exercise Expert for the Aging Adult  03/25/17 9:28 AM Phone: [phone] Fax: [phone]

## 2017-03-25 NOTE — Progress Notes (Signed)
Anesthesia Chart Review:  Patient is a same day work up  Patient is a 56 year old male scheduled for R shoulder arthroscopy with debridement, subacromial decompression and distal clavicle excision 03/18/2017 with Frankey Shown, MD  Care providers:  - PCP is Arnoldo Morale, MD  - Cardiologist is Shelva Majestic, MD.  Last office visit 03/04/17 (for PAF; pt also c/o snoring, is morbidly obese- to have sleep study in near future). Pt cleared for surgery 03/24/17 by Almyra Deforest, PA   PMH includes:  Atrial fibrillation, HTN, DM, fatty liver, Charcot joint right foot. Alcohol abuse. Former smoker. BMI 39.5. S/p R TKA 03/28/15.  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   - ED visit 11/01/16 for afib with RVR; s/p cardioversion  - ED visit 08/04/16 for afib with RVR; s/p cardioversion  Medications include: Eliquis, Lipitor, Byetta, Lantus, losartan, metoprolol. Pt to hold eliquis 3 days before surgery  Labs will be obtained day of surgery - HbA1c was 8.0 on 02/23/17  1 view CXR 08/04/16: Interval cardiomegaly and pulmonary vascular congestion.  EKG 03/04/17: Sinus rhythm with PACs  Echo 08/18/16:  - Left ventricle: The cavity size was mildly dilated. Wall thickness was increased in a pattern of mild LVH. Systolic function was normal. The estimated ejection fraction was in the range of 50% to 55%. Wall motion was normal; there were no regional wall motion abnormalities. Doppler parameters are consistent with abnormal left ventricular relaxation (grade 1 diastolic dysfunction). - Aortic valve: Transvalvular velocity was within the normal range. There was no stenosis. There was no regurgitation. - Mitral valve: Transvalvular velocity was within the normal range. There was no evidence for stenosis. There was trivial regurgitation. - Left atrium: The atrium was moderately dilated. - Right ventricle: The cavity size was normal. Wall thickness was normal. Systolic function was normal. - Tricuspid valve: There was no  regurgitation.   If labs acceptable day of surgery, I anticipate pt can proceed with surgery as scheduled.   Willeen Cass, FNP-BC [facility]/Anesthesiology Phone: [phone] 03/25/2017 11:21 AM

## 2017-03-25 NOTE — Therapy (Signed)
[place], [place], [address] Phone: [phone]   Fax:  [phone]  Physical Therapy Evaluation/one time evaluation  Patient Details  Name: Jeffery [name] MRN: [id number] Date of Birth: 28-Dec-1960 Referring Provider: Frankey Shown MD   Encounter Date: 03/25/2017  PT End of Session - 03/25/17 1007    Visit Number  1    Number of Visits  1    Date for PT Re-Evaluation  03/25/17    Authorization Type  Medicaid    Authorization - Visit Number  1    Authorization - Number of Visits  1    PT Start Time  0849    PT Stop Time  0940    PT Time Calculation (min)  51 min    Activity Tolerance  Patient tolerated treatment well    Behavior During Therapy  [facility] for tasks assessed/performed       Past Medical History:  Diagnosis Date  . Atrial fibrillation ([place])   . Charcot's joint of right foot   . DDD (degenerative disc disease), lumbar   . Diabetes mellitus without complication ([place])   . Fatty liver   . Hypertension   . Obesity   . Rotator cuff disorder   . Shoulder impingement, right     Past Surgical History:  Procedure Laterality Date  . AMPUTATION Left 10/02/2014   Procedure: Left Third toe amputation ;  Surgeon: [name], MD;  Location: [place];  Service: Orthopedics;  Laterality: Left;  Regular bed, wants to follow hip  . ANTERIOR CRUCIATE LIGAMENT REPAIR Right 90   reconstruction  . APPLICATION OF WOUND VAC Left 10/02/2014   Procedure: APPLICATION OF WOUND VAC; toe Surgeon: [name], MD;  Location: [facility];  Service: Orthopedics;  Laterality: Left;  . I&D EXTREMITY Left 10/05/2014   Procedure: IRRIGATION AND DEBRIDEMENT LEFT FOOT;  Surgeon: [name], MD;  Location: [facility];  Service: Orthopedics;  Laterality: Left;  . KNEE ARTHROSCOPY W/ ACL RECONSTRUCTION Right   . TOTAL KNEE ARTHROPLASTY Right 03/28/2015  . TOTAL KNEE ARTHROPLASTY Right 03/28/2015   Procedure: RIGHT TOTAL KNEE  ARTHROPLASTY;  Surgeon: [name], MD;  Location: [facility];  Service: Orthopedics;  Laterality: Right;    There were no vitals filed for this visit.   Subjective Assessment - 03/25/17 0857    Subjective  I have had back pain for 16 years.   I am here for one time evaluation for my back before shoulder surgery tomorrow.  I know I have narrowing of my spinal canal,     Pertinent History  R TKA 03-08-15, Charcot foot 1/17  Scheduled for R shoulder arthroscopy 03-26-17,  Low back pain since 56 yo, A-fib, DM2, HBP    Limitations  House hold activities;Walking;Standing;Sitting    How long can you sit comfortably?  5 minutes in kitchen chair, recliner 30 minutes    How long can you stand comfortably?  30 minutes    How long can you walk comfortably?  30 minutes    Diagnostic tests  MRI    Patient Stated Goals  Get a home program to help before my     Currently in Pain?  Yes    Pain Score  7  with pain medication    Pain Location  Back    Pain Orientation  Right    Pain Descriptors / Indicators  Nagging;Aching;Dull    Pain Type  Chronic pain  Pain Onset  More than a month ago    Pain Frequency  Constant    Aggravating Factors   sitting , standing too long bending over and picking something up.  hurts at night sleeping    Pain Relieving Factors  Take pain medication         [facility] PT Assessment - 03/25/17 0903      Assessment   Medical Diagnosis  Low back pain right sided    Referring Provider  Frankey Shown MD    Onset Date/Surgical Date  -- back 16 years ago.     Hand Dominance  Right    Prior Therapy  none      Precautions   Precautions  None      Restrictions   Weight Bearing Restrictions  No      Balance Screen   Has the patient fallen in the past 6 months  No    Has the patient had a decrease in activity level because of a fear of falling?   No    Is the patient reluctant to leave their home because of a fear of falling?   No      Home Environment   Living Environment   Private residence    Living Arrangements  Parent    Type of [place] to enter    Entrance Stairs-Number of Steps  11    Entrance Stairs-Rails  Right    Home Layout  Multi-level      Prior Function   Level of Independence  Independent    Vocation Requirements  filed for disability      Cognition   Overall Cognitive Status  Within Functional Limits for tasks assessed      Observation/Other Assessments   Observations  Pt leans to left in sitting    Focus on Therapeutic Outcomes (FOTO)   not taken  one time eval      Functional Tests   Functional tests  Squat      Squat   Comments  lean to left keeping wt off the right side in sitting and standing      Posture/Postural Control   Posture/Postural Control  Postural limitations    Postural Limitations  Anterior pelvic tilt;Rounded Shoulders;Forward head    Posture Comments  increased abdominal girth      ROM / Strength   AROM / PROM / Strength  AROM;Strength      AROM   Overall AROM   Deficits    Overall AROM Comments  Pt with poor abdominal control and strength    Lumbar Flexion  40 ERP    Lumbar Extension  7 pain    Lumbar - Right Side Bend  17  pain increases    Lumbar - Left Side Bend  25    Lumbar - Right Rotation  50% avalable    Lumbar - Left Rotation  50 % available      Strength   Overall Strength  Deficits    Right Hip Flexion  4+/5    Right Hip Extension  4-/5    Right Hip ABduction  4-/5    Left Hip Flexion  5/5    Left Hip Extension  4/5    Left Hip ABduction  4/5    Right Knee Flexion  4+/5    Right Knee Extension  4+/5    Left Knee Flexion  5/5    Left Knee Extension  5/5    Right Ankle Dorsiflexion  4+/5    Right Ankle Plantar Flexion  5/5    Left Ankle Dorsiflexion  5/5    Left Ankle Plantar Flexion  5/5      Flexibility   Hamstrings  right 60 ,  left 72      Special Tests    Special Tests  Lumbar      FABER test   findings  Positive    Side  Right    Comment   Pain in right back      Slump test   Findings  Negative    Comment  bil      Straight Leg Raise   Findings  Negative    Comment  bil some discomfort on right back             Objective measurements completed on examination: See above findings.      [facility] Adult PT Treatment/Exercise - 03/25/17 0903      Self-Care   Self-Care  Posture;Lifting;ADL's;Other Self-Care Comments all information briefly discussed to spend time on  HEP    ADL's  given handout of information    Lifting  given handout on information    Posture  given handout on information    Other Self-Care Comments   talked about community wellness and aquatics for movement       Lumbar Exercises: Stretches   Active Hamstring Stretch  4 reps    Active Hamstring Stretch Limitations  2 reps on right 30 seconds with strap in supine and sitting     Single Knee to Chest Stretch  5 reps;10 seconds    Lower Trunk Rotation  30 seconds;2 reps    Lower Trunk Rotation Limitations  right and left      Lumbar Exercises: Standing   Wall Slides  10 reps;5 seconds    Wall Slides Limitations  pt fatigues easily at 5 reps    Other Standing Lumbar Exercises  calf stretch x 2 right and left each 20-30 sec stretch    Other Standing Lumbar Exercises  lateral wall stretch with right side to wall and right hip into wall to align spine and prevent left trunk lean  20 second hold as tolerated      Lumbar Exercises: Supine   Ab Set  5 reps;5 seconds    AB Set Limitations  pelvic tilt x 10 with VC and TC  also 3 reps against wall    Bridge  10 reps    Bridge Limitations  pt with 1/2 AROM and decreased strength in right hip      attempted quadriped mid back stretch but will not be able to do after shoulder surgery tomorrow       PT Education - 03/25/17 0912    Education provided  Yes    Education Details  one time eval.  given basic back HEP, given writting handout for ADL posture and body mechanics to review at home     Person(s) Educated  Patient    Methods  Explanation;Demonstration;Tactile cues;Verbal cues;Handout    Comprehension  Verbalized understanding;Returned demonstration          PT Long Term Goals - 03/25/17 0953      PT LONG TERM GOAL #1   Title  Pt will be given information regarding posture, body mechanics and lifting to read at home    Time  1    Period  Days    Status  Achieved  Target Date  03/25/17      PT LONG TERM GOAL #2   Title  Pt will be given HEP for basic back strength and flexibility and lateral mckenzie technique for left trunk lean.    Time  1    Period  Days    Status  Achieved    Target Date  03/25/17             Plan - 03/25/17 0946    Clinical Impression Statement  56 yo pt with multiple medical issues scheduled for right shoulder arthroscopy on 03-26-17. Pt presents with left leaning trunk in ambulation and sitting and presents with symptoms compatible with spinal stenosis and  disc bulging as indicated on  MRI, Pt was given one time evaluation and basic HEP and information on body mechanics, posture and lifting.  Pt with left sided leaning to unweight painful right side.  Pt spends a lot of time in a recliner and was educated on importance of moving and sitting with good posture and strengthening of core muscles.  Importance of Aquatics and walking for exercise.  Pt complains of Charcot foot which hinders much of these activities as well as right shoulder pain.  Pt  has poor abdominal control, tight hamstrings.  Poor core strength. abnormal posture , decreased right LE strength and wants HEP to use before surgery for shoulder tomorrow.  Pt would benefit from longer course of LBP training if basic back pain does not address pain and strength issues.  Will DC from one time evaluation    History and Personal Factors relevant to plan of care:  DM 2, HBP, Right TKA 2016, Right Charcot foot, low back pain since age 4,     Clinical Presentation  Evolving     Clinical Decision Making  Moderate    Rehab Potential  Good    PT Frequency  One time visit    PT Treatment/Interventions  Therapeutic activities;Therapeutic exercise;Patient/family education;ADLs/Self Care Home Management    PT Next Visit Plan  DC  one time eval    PT Home Exercise Plan  Basic back program. lateraL Mckenzie technique right side to wall and hip towards right.  , ADL, posture, body mechanics    Consulted and Agree with Plan of Care  Patient       Patient will benefit from skilled therapeutic intervention in order to improve the following deficits and impairments:  Obesity, Pain, Improper body mechanics, Postural dysfunction, Decreased range of motion, Abnormal gait, Increased muscle spasms, Decreased strength(left trunk lean)  Visit Diagnosis: Chronic right-sided low back pain with sciatica, sciatica laterality unspecified  Abnormal posture  Muscle weakness (generalized)     Problem List Patient Active Problem List   Diagnosis Date Noted  . Diabetes mellitus without complication ([place]) 84/16/6063  . Achilles tendon contracture, right 01/05/2017  . Closed nondisplaced fracture of distal phalanx of right great toe 01/05/2017  . Paroxysmal atrial fibrillation ([place]) 10/30/2016  . Pain in right ankle and joints of right foot 07/09/2016  . Diabetic polyneuropathy associated with type 2 diabetes mellitus ([place]) 07/09/2016  . 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 Impingement syndrome of right shoulder 07/07/2016  . Pain in right foot 07/07/2016  . Charcot's joint of right foot 07/03/2016  . Morbid obesity ([place]) 06/05/2016  . S/P total knee replacement using cement 03/28/2015  . Knee osteoarthritis 11/12/2014  . Hypertension 10/15/2014  . Osteomyelitis of toe of left foot ([place])   .  Toe osteomyelitis, left ([place]) 10/01/2014  . Charcot foot due to diabetes mellitus ([place]) 10/01/2014  . Accelerated hypertension  10/01/2014  . Gangrene left third toe 10/01/2014  . SHOULDER JOINT INSTABILITY 05/27/2010  . SHOULDER PAIN, RIGHT 05/27/2010   Voncille Lo, PT Certified Exercise Expert for the Aging Adult  03/25/17 10:15 AM Phone: [phone] Fax: [facility] [address] [place], [place], [address] Phone: [phone]   Fax:  [phone]  Name: TOM MACPHERSON MRN: [id number] Date of Birth: 1960-04-16

## 2017-03-26 ENCOUNTER — Ambulatory Visit (HOSPITAL_COMMUNITY)
Admission: RE | Admit: 2017-03-26 | Discharge: 2017-03-26 | Disposition: A | Discharge status: Home / Self Care | Payer: Medicaid Other | Source: Ambulatory Visit | Attending: Orthopaedic Surgery | Admitting: Orthopaedic Surgery

## 2017-03-26 ENCOUNTER — Encounter (HOSPITAL_COMMUNITY)
Admission: RE | Disposition: A | Discharge status: Home / Self Care | Payer: Self-pay | Source: Ambulatory Visit | Attending: Orthopaedic Surgery

## 2017-03-26 ENCOUNTER — Ambulatory Visit (HOSPITAL_COMMUNITY): Payer: Medicaid Other | Admitting: Emergency Medicine

## 2017-03-26 ENCOUNTER — Encounter (HOSPITAL_COMMUNITY): Payer: Self-pay | Admitting: *Deleted

## 2017-03-26 DIAGNOSIS — M75121 Complete rotator cuff tear or rupture of right shoulder, not specified as traumatic: Secondary | ICD-10-CM | POA: Diagnosis not present

## 2017-03-26 DIAGNOSIS — M7541 Impingement syndrome of right shoulder: Secondary | ICD-10-CM | POA: Insufficient documentation

## 2017-03-26 DIAGNOSIS — Z96651 Presence of right artificial knee joint: Secondary | ICD-10-CM | POA: Diagnosis not present

## 2017-03-26 DIAGNOSIS — K760 Fatty (change of) liver, not elsewhere classified: Secondary | ICD-10-CM | POA: Insufficient documentation

## 2017-03-26 DIAGNOSIS — I4891 Unspecified atrial fibrillation: Secondary | ICD-10-CM | POA: Insufficient documentation

## 2017-03-26 DIAGNOSIS — Z6839 Body mass index (BMI) 39.0-39.9, adult: Secondary | ICD-10-CM | POA: Insufficient documentation

## 2017-03-26 DIAGNOSIS — Z794 Long term (current) use of insulin: Secondary | ICD-10-CM | POA: Insufficient documentation

## 2017-03-26 DIAGNOSIS — Z89422 Acquired absence of other left toe(s): Secondary | ICD-10-CM | POA: Diagnosis not present

## 2017-03-26 DIAGNOSIS — A5216 Charcot's arthropathy (tabetic): Secondary | ICD-10-CM | POA: Diagnosis not present

## 2017-03-26 DIAGNOSIS — M7521 Bicipital tendinitis, right shoulder: Secondary | ICD-10-CM | POA: Insufficient documentation

## 2017-03-26 DIAGNOSIS — Z87891 Personal history of nicotine dependence: Secondary | ICD-10-CM | POA: Insufficient documentation

## 2017-03-26 DIAGNOSIS — Z79899 Other long term (current) drug therapy: Secondary | ICD-10-CM | POA: Diagnosis not present

## 2017-03-26 DIAGNOSIS — I10 Essential (primary) hypertension: Secondary | ICD-10-CM | POA: Insufficient documentation

## 2017-03-26 DIAGNOSIS — E119 Type 2 diabetes mellitus without complications: Secondary | ICD-10-CM | POA: Diagnosis not present

## 2017-03-26 DIAGNOSIS — Z7901 Long term (current) use of anticoagulants: Secondary | ICD-10-CM | POA: Insufficient documentation

## 2017-03-26 DIAGNOSIS — M19011 Primary osteoarthritis, right shoulder: Secondary | ICD-10-CM | POA: Insufficient documentation

## 2017-03-26 HISTORY — DX: Obesity, unspecified: E66.9

## 2017-03-26 HISTORY — DX: Charcot's joint, right ankle and foot: M14.671

## 2017-03-26 HISTORY — DX: Impingement syndrome of right shoulder: M75.41

## 2017-03-26 HISTORY — DX: Fatty (change of) liver, not elsewhere classified: K76.0

## 2017-03-26 HISTORY — DX: Other specified joint disorders, right shoulder: M25.811

## 2017-03-26 LAB — GLUCOSE, CAPILLARY
Glucose-Capillary: 117 mg/dL — ABNORMAL HIGH (ref 65–99)
Glucose-Capillary: 84 mg/dL (ref 65–99)
Glucose-Capillary: 58 mg/dL — ABNORMAL LOW (ref 65–99)
Glucose-Capillary: 100 mg/dL — ABNORMAL HIGH (ref 65–99)
Glucose-Capillary: 58 mg/dL — ABNORMAL LOW (ref 65–99)

## 2017-03-26 LAB — COMPREHENSIVE METABOLIC PANEL
ALT: 23 U/L (ref 17–63)
AST: 28 U/L (ref 15–41)
Albumin: 3.8 g/dL (ref 3.5–5.0)
Alkaline Phosphatase: 73 U/L (ref 38–126)
Anion gap: 10 (ref 5–15)
BUN: 12 mg/dL (ref 6–20)
CO2: 23 mmol/L (ref 22–32)
Calcium: 9 mg/dL (ref 8.9–10.3)
Chloride: 104 mmol/L (ref 101–111)
Creatinine, Ser: 0.73 mg/dL (ref 0.61–1.24)
GFR calc Af Amer: >60 mL/min (ref >60)
GFR calc non Af Amer: >60 mL/min (ref >60)
Glucose, Bld: 107 mg/dL — ABNORMAL HIGH (ref 65–99)
Potassium: 4.7 mmol/L (ref 3.5–5.1)
Sodium: 137 mmol/L (ref 135–145)
Total Bilirubin: 0.8 mg/dL (ref 0.3–1.2)
Total Protein: 7.1 g/dL (ref 6.5–8.1)

## 2017-03-26 LAB — CBC
HCT: 44.5 % (ref 39.0–52.0)
Hemoglobin: 14.6 g/dL (ref 13.0–17.0)
MCH: 28.3 pg (ref 26.0–34.0)
MCHC: 32.8 g/dL (ref 30.0–36.0)
MCV: 86.4 fL (ref 78.0–100.0)
Platelets: 203 10*3/uL (ref 150–400)
RBC: 5.15 MIL/uL (ref 4.22–5.81)
RDW: 14.2 % (ref 11.5–15.5)
WBC: 9.3 10*3/uL (ref 4.0–10.5)

## 2017-03-26 SURGERY — SHOULDER ARTHROSCOPY WITH SUBACROMIAL DECOMPRESSION AND DISTAL CLAVICLE EXCISION
Anesthesia: Regional | Site: Shoulder | Laterality: Right

## 2017-03-26 MED ORDER — LIDOCAINE HCL (CARDIAC) 20 MG/ML IV SOLN
INTRAVENOUS | Status: DC | PRN
  Administered 2017-03-26: 60 mg via INTRAVENOUS

## 2017-03-26 MED ORDER — MIDAZOLAM HCL 2 MG/2ML IJ SOLN
INTRAMUSCULAR | Status: AC
  Administered 2017-03-26: 2 mg via INTRAVENOUS
  Filled 2017-03-26: qty 2

## 2017-03-26 MED ORDER — FENTANYL CITRATE (PF) 100 MCG/2ML IJ SOLN
INTRAMUSCULAR | Status: AC
  Filled 2017-03-26: qty 2

## 2017-03-26 MED ORDER — OXYCODONE-ACETAMINOPHEN 5-325 MG PO TABS
ORAL_TABLET | ORAL | Status: AC
  Filled 2017-03-26: qty 1

## 2017-03-26 MED ORDER — FENTANYL CITRATE (PF) 250 MCG/5ML IJ SOLN
INTRAMUSCULAR | Status: AC
  Filled 2017-03-26: qty 5

## 2017-03-26 MED ORDER — OXYCODONE-ACETAMINOPHEN 5-325 MG PO TABS: 1.0000 | ORAL_TABLET | ORAL | 0 refills | Status: DC | PRN

## 2017-03-26 MED ORDER — ROPIVACAINE HCL 7.5 MG/ML IJ SOLN
INTRAMUSCULAR | Status: DC | PRN
  Administered 2017-03-26: 20 mL via PERINEURAL

## 2017-03-26 MED ORDER — PHENYLEPHRINE HCL 10 MG/ML IJ SOLN
INTRAMUSCULAR | Status: DC | PRN
  Administered 2017-03-26: 80 ug via INTRAVENOUS
  Administered 2017-03-26: 40 ug via INTRAVENOUS
  Administered 2017-03-26: 80 ug via INTRAVENOUS
  Administered 2017-03-26: 40 ug via INTRAVENOUS
  Administered 2017-03-26 (×2): 80 ug via INTRAVENOUS

## 2017-03-26 MED ORDER — DEXAMETHASONE SODIUM PHOSPHATE 10 MG/ML IJ SOLN
INTRAMUSCULAR | Status: DC | PRN
  Administered 2017-03-26: 10 mg via INTRAVENOUS

## 2017-03-26 MED ORDER — STERILE WATER FOR IRRIGATION IR SOLN
Status: DC | PRN
  Administered 2017-03-26: 1000 mL

## 2017-03-26 MED ORDER — ONDANSETRON HCL 4 MG PO TABS: 4.0000 mg | ORAL_TABLET | Freq: Three times a day (TID) | ORAL | 0 refills | Status: DC | PRN

## 2017-03-26 MED ORDER — TIZANIDINE HCL 4 MG PO TABS: 4.0000 mg | ORAL_TABLET | Freq: Four times a day (QID) | ORAL | 2 refills | Status: DC | PRN

## 2017-03-26 MED ORDER — PHENYLEPHRINE HCL 10 MG/ML IJ SOLN
INTRAMUSCULAR | Status: DC | PRN
  Administered 2017-03-26: 20 ug/min via INTRAVENOUS

## 2017-03-26 MED ORDER — SUGAMMADEX SODIUM 200 MG/2ML IV SOLN
INTRAVENOUS | Status: DC | PRN
  Administered 2017-03-26: 300 mg via INTRAVENOUS

## 2017-03-26 MED ORDER — MIDAZOLAM HCL 2 MG/2ML IJ SOLN
INTRAMUSCULAR | Status: AC
  Filled 2017-03-26: qty 2

## 2017-03-26 MED ORDER — FENTANYL CITRATE (PF) 100 MCG/2ML IJ SOLN
INTRAMUSCULAR | Status: DC | PRN
  Administered 2017-03-26: 150 ug via INTRAVENOUS

## 2017-03-26 MED ORDER — FENTANYL CITRATE (PF) 100 MCG/2ML IJ SOLN: 25.0000 ug | INTRAMUSCULAR | Status: DC | PRN

## 2017-03-26 MED ORDER — ONDANSETRON HCL 4 MG/2ML IJ SOLN: 4.0000 mg | Freq: Once | INTRAMUSCULAR | Status: DC | PRN

## 2017-03-26 MED ORDER — MIDAZOLAM HCL 2 MG/2ML IJ SOLN
2.0000 mg | Freq: Once | INTRAMUSCULAR | Status: AC
  Administered 2017-03-26: 2 mg via INTRAVENOUS

## 2017-03-26 MED ORDER — BUPIVACAINE-EPINEPHRINE (PF) 0.25% -1:200000 IJ SOLN
INTRAMUSCULAR | Status: DC | PRN
  Administered 2017-03-26: 30 mL

## 2017-03-26 MED ORDER — LACTATED RINGERS IV SOLN
INTRAVENOUS | Status: DC
  Administered 2017-03-26: 12:00:00 via INTRAVENOUS

## 2017-03-26 MED ORDER — PROMETHAZINE HCL 25 MG PO TABS: 25.0000 mg | ORAL_TABLET | Freq: Four times a day (QID) | ORAL | 1 refills | Status: DC | PRN

## 2017-03-26 MED ORDER — ROCURONIUM BROMIDE 100 MG/10ML IV SOLN
INTRAVENOUS | Status: DC | PRN
  Administered 2017-03-26: 70 mg via INTRAVENOUS
  Administered 2017-03-26: 10 mg via INTRAVENOUS

## 2017-03-26 MED ORDER — ONDANSETRON HCL 4 MG/2ML IJ SOLN
INTRAMUSCULAR | Status: DC | PRN
  Administered 2017-03-26: 4 mg via INTRAVENOUS

## 2017-03-26 MED ORDER — BUPIVACAINE-EPINEPHRINE (PF) 0.25% -1:200000 IJ SOLN
INTRAMUSCULAR | Status: AC
  Filled 2017-03-26: qty 30

## 2017-03-26 MED ORDER — OXYCODONE-ACETAMINOPHEN 5-325 MG PO TABS
1.0000 | ORAL_TABLET | Freq: Once | ORAL | Status: AC
  Administered 2017-03-26: 1 via ORAL

## 2017-03-26 MED ORDER — PROPOFOL 10 MG/ML IV BOLUS
INTRAVENOUS | Status: DC | PRN
  Administered 2017-03-26: 170 mg via INTRAVENOUS

## 2017-03-26 MED ORDER — PROPOFOL 10 MG/ML IV BOLUS
INTRAVENOUS | Status: AC
  Filled 2017-03-26: qty 40

## 2017-03-26 MED ORDER — SENNOSIDES-DOCUSATE SODIUM 8.6-50 MG PO TABS: 1.0000 | ORAL_TABLET | Freq: Every evening | ORAL | 1 refills | Status: DC | PRN

## 2017-03-26 MED ORDER — OXYCODONE HCL ER 10 MG PO T12A: 10.0000 mg | EXTENDED_RELEASE_TABLET | Freq: Two times a day (BID) | ORAL | 0 refills | Status: DC

## 2017-03-26 MED ORDER — DEXTROSE 50 % IV SOLN
INTRAVENOUS | Status: AC
  Administered 2017-03-26: 25 mL
  Filled 2017-03-26: qty 50

## 2017-03-26 MED ORDER — EPHEDRINE SULFATE 50 MG/ML IJ SOLN
INTRAMUSCULAR | Status: DC | PRN
  Administered 2017-03-26 (×5): 10 mg via INTRAVENOUS

## 2017-03-26 MED ORDER — SODIUM CHLORIDE 0.9 % IR SOLN
Status: DC | PRN
  Administered 2017-03-26 (×5): 3000 mL

## 2017-03-26 MED ORDER — SODIUM CHLORIDE 0.9 % IR SOLN
Status: DC | PRN
  Administered 2017-03-26: 1000 mL

## 2017-03-26 SURGICAL SUPPLY — 53 items
BLADE GREAT WHITE 4.2 (BLADE) ×2 IMPLANT
BLADE GREAT WHITE 4.2MM (BLADE) ×1
BLADE SURG 11 STRL SS (BLADE) ×3 IMPLANT
BOOTCOVER CLEANROOM LRG (PROTECTIVE WEAR) ×6 IMPLANT
BUR OVAL 4.0 (BURR) ×3 IMPLANT
CANNULA 5.75X7 CRYSTAL CLEAR (CANNULA) ×3 IMPLANT
CANNULA 5.75X71 LONG (CANNULA) ×3 IMPLANT
DRAPE IMP U-DRAPE 54X76 (DRAPES) ×3 IMPLANT
DRAPE ORTHO SPLIT 77X108 STRL (DRAPES) ×4
DRAPE PROXIMA HALF (DRAPES) ×3 IMPLANT
DRAPE STERI 35X30 U-POUCH (DRAPES) ×3 IMPLANT
DRAPE SURG 17X11 SM STRL (DRAPES) ×3 IMPLANT
DRAPE SURG ORHT 6 SPLT 77X108 (DRAPES) ×2 IMPLANT
DRAPE U-SHAPE 47X51 STRL (DRAPES) ×3 IMPLANT
DRSG PAD ABDOMINAL 8X10 ST (GAUZE/BANDAGES/DRESSINGS) ×9 IMPLANT
DURAPREP 26ML APPLICATOR (WOUND CARE) ×3 IMPLANT
FLUID NSS /IRRIG 3000 ML XXX (IV SOLUTION) ×6 IMPLANT
GAUZE SPONGE 4X4 12PLY STRL (GAUZE/BANDAGES/DRESSINGS) ×6 IMPLANT
GAUZE XEROFORM 1X8 LF (GAUZE/BANDAGES/DRESSINGS) ×3 IMPLANT
GLOVE BIOGEL PI IND STRL 7.0 (GLOVE) ×1 IMPLANT
GLOVE BIOGEL PI INDICATOR 7.0 (GLOVE) ×2
GLOVE SKINSENSE NS SZ7.5 (GLOVE) ×4
GLOVE SKINSENSE STRL SZ7.5 (GLOVE) ×2 IMPLANT
GLOVE SURG SYN 7.5  E (GLOVE) ×2
GLOVE SURG SYN 7.5 E (GLOVE) ×1 IMPLANT
GOWN STRL REIN XL XLG (GOWN DISPOSABLE) ×3 IMPLANT
IMPL ANCHOR PEEK 4.5MM (Anchor) ×2 IMPLANT
IMPLANT ANCHOR PEEK 4.5MM (Anchor) ×6 IMPLANT
KIT BASIN OR (CUSTOM PROCEDURE TRAY) ×3 IMPLANT
KIT ROOM TURNOVER OR (KITS) ×3 IMPLANT
KIT SHOULDER TRACTION (DRAPES) ×3 IMPLANT
MANIFOLD NEPTUNE II (INSTRUMENTS) ×3 IMPLANT
NEEDLE SCORPION MULTI FIRE (NEEDLE) ×3 IMPLANT
NEEDLE SPNL 18GX3.5 QUINCKE PK (NEEDLE) ×3 IMPLANT
NS IRRIG 1000ML POUR BTL (IV SOLUTION) ×3 IMPLANT
PACK SHOULDER (CUSTOM PROCEDURE TRAY) ×3 IMPLANT
PAD ARMBOARD 7.5X6 YLW CONV (MISCELLANEOUS) ×6 IMPLANT
PROBE BIPOLAR ATHRO 135MM 90D (MISCELLANEOUS) ×3 IMPLANT
SCREW 4.0X28 (Screw) ×3 IMPLANT
SET ARTHROSCOPY TUBING (MISCELLANEOUS) ×2
SET ARTHROSCOPY TUBING LN (MISCELLANEOUS) ×1 IMPLANT
SLING ARM FOAM STRAP LRG (SOFTGOODS) IMPLANT
SLING ARM FOAM STRAP MED (SOFTGOODS) IMPLANT
SLING ARM FOAM STRAP XLG (SOFTGOODS) ×3 IMPLANT
SPONGE LAP 4X18 X RAY DECT (DISPOSABLE) ×6 IMPLANT
SUT ETHILON 3 0 PS 1 (SUTURE) ×6 IMPLANT
SUT TIGER TAPE 7 IN WHITE (SUTURE) ×3 IMPLANT
TAPE CLOTH SURG 6X10 WHT LF (GAUZE/BANDAGES/DRESSINGS) ×3 IMPLANT
TAPE FIBER 2MM 7IN #2 BLUE (SUTURE) IMPLANT
TOWEL OR 17X24 6PK STRL BLUE (TOWEL DISPOSABLE) ×3 IMPLANT
TOWEL OR 17X26 10 PK STRL BLUE (TOWEL DISPOSABLE) ×3 IMPLANT
WAND HAND CNTRL MULTIVAC 90 (MISCELLANEOUS) ×3 IMPLANT
WATER STERILE IRR 1000ML POUR (IV SOLUTION) ×3 IMPLANT

## 2017-03-26 NOTE — Transfer of Care (Signed)
Immediate Anesthesia Transfer of Care Note  Patient: Jeffery [name]  Procedure(s) Performed: RIGHT SHOULDER ARTHROSCOPY WITH DEBRIDEMENT, SUBACROMIAL DECOMPRESSION AND DISTAL CLAVICLE EXCISION (Right Shoulder)  Patient Location: PACU  Anesthesia Type:General  Level of Consciousness: awake, alert , oriented and patient cooperative  Airway & Oxygen Therapy: Patient Spontanous Breathing  Post-op Assessment: Report given to RN, Post -op Vital signs reviewed and stable and Patient moving all extremities X 4  Post vital signs: Reviewed and stable  Last Vitals:  Vitals:   03/26/17 1538 03/26/17 1545  BP: 120/65   Pulse: 72 76  Resp: 12 14  Temp:  36.5 C  SpO2: 93% 99%    Last Pain:  Vitals:   03/26/17 1550  TempSrc:   PainSc: 3          Complications: No apparent anesthesia complications

## 2017-03-26 NOTE — Op Note (Signed)
   Date of Surgery: 08/07/2015  INDICATIONS: The patient is a 56 y.o.-year-old male with right shoulder pain that has failed conservative treatment;  The patient did consent to the procedure after discussion of the risks and benefits.  PREOPERATIVE DIAGNOSIS:  1.  Right shoulder supraspinatus tear 2.  Right shoulder impingement syndrome 3.  Right shoulder AC joint arthropathy 4.  Right shoulder degenerative labrum, biceps tendinopathy  POSTOPERATIVE DIAGNOSIS: Same.  PROCEDURE:  1.  Arthroscopic right shoulder rotator cuff repair 2.  Arthroscopic right shoulder distal clavicle excision 3.  Arthroscopic right shoulder extensive debridement of degenerative labrum, biceps anchor, rotator cuff, subacromial bursa, subdeltoid bursa 4.  Arthroscopic right shoulder acromioplasty  SURGEON: N. Eduard Roux, M.D.  ASSIST: Carlynn Purl, PA-C necessary for the timely completion of the procedure and the complexity of the procedure..  ANESTHESIA:  general, regional  IV FLUIDS AND URINE: See anesthesia.  ESTIMATED BLOOD LOSS: minimal mL.  IMPLANTS: [place] 4.5 mm biocomposite anchors x 2  COMPLICATIONS: None.  DESCRIPTION OF PROCEDURE: The patient was brought to the operating room and placed supine on the operating table.  The patient had been signed prior to the procedure and this was documented. The patient had the anesthesia placed by the anesthesiologist.  A time-out was performed to confirm that this was the correct patient, site, side and location. The patient did receive antibiotics prior to the incision and was re-dosed during the procedure as needed at indicated intervals.  The patient was then moved into the lateral position with the operative extremity suspended in the fishing pole mechanism. The patient had the operative extremity prepped and draped in the standard surgical fashion.    The standard shoulder arthroscopy portals were created.  We first performed a diagnostic arthroscopy  of the shoulder.  Extensive debridement of the labrum and the biceps anchor and the articular surface of the rotator cuff was performed using oscillating shaver.  We then repositioned the arthroscope into the subacromial space.  Extensive debridement of the subacromial and subdeltoid bursa was performed using oscillating shaver.  The distal clavicle was then identified and a distal clavicle excision was performed using a high-speed bur.  Acromioplasty was then also performed with the high-speed bur.  After this was done we then debrided the full-thickness rotator cuff tear.  There was a crescent-shaped full-thickness retracted tear with good mobilization.  We then performed a single row horizontal mattress repair with 2 4.5 mm bio composite anchors.  There is good excursion of the repair.  We were able to pull this back to the footprint.  Excess fluid was then drained from the shoulder.  The incisions were closed with interrupted nylon sutures.  Sterile dressings were applied.  Shoulder was placed in a shoulder immobilizer.  Patient tolerated procedure well had no immediate comp occasions.  POSTOPERATIVE PLAN: Patient will follow-up in 1 week to begin range of motion.  Azucena Cecil, MD [place] 10:16 AM

## 2017-03-26 NOTE — Anesthesia Procedure Notes (Signed)
Anesthesia Regional Block: Interscalene brachial plexus block   Pre-Anesthetic Checklist: ,, timeout performed, Correct Patient, Correct Site, Correct Laterality, Correct Procedure,, site marked, risks and benefits discussed, Surgical consent,  Pre-op evaluation,  At surgeon's request and post-op pain management  Laterality: Right  Prep: chloraprep       Needles:  Injection technique: Single-shot  Needle Type: Echogenic Stimulator Needle     Needle Length: 9cm  Needle Gauge: 21     Additional Needles:   Procedures:,,,, ultrasound used (permanent image in chart),,,,  Narrative:  Start time: 03/26/2017 12:20 PM End time: 03/26/2017 12:30 PM Injection made incrementally with aspirations every 5 mL.  Performed by: Personally  Anesthesiologist: Murvin Natal, MD  Additional Notes: Functioning IV was confirmed and monitors were applied.  A 42mm 21ga Arrow echogenic stimulator needle was used. Sterile prep, hand hygiene and sterile gloves were used.  Negative aspiration and negative test dose prior to incremental administration of local anesthetic. The patient tolerated the procedure well.

## 2017-03-26 NOTE — Anesthesia Preprocedure Evaluation (Addendum)
Anesthesia Evaluation  Patient identified by MRN, date of birth, ID band Patient awake    Reviewed: Allergy & Precautions, NPO status , Patient's Chart, lab work & pertinent test results, reviewed documented beta blocker date and time   Airway Mallampati: III  TM Distance: <3 FB Neck ROM: Full    Dental  (+) Teeth Intact, Dental Advisory Given,    Pulmonary former smoker,    breath sounds clear to auscultation       Cardiovascular hypertension, Pt. on medications and Pt. on home beta blockers + dysrhythmias Atrial Fibrillation  Rhythm:Regular Rate:Normal  ECG: SR, rate 92  ECHO: Left ventricle: The cavity size was mildly dilated. Wall thickness was increased in a pattern of mild LVH. Systolic function was normal. The estimated ejection fraction was in the range of 50% to 55%. Wall motion was normal; there were no regional wall motion abnormalities. Doppler parameters are consistent with abnormal left ventricular relaxation (grade 1 diastolic dysfunction). Aortic valve: Transvalvular velocity was within the normal range. There was no stenosis. There was no regurgitation. Mitral valve: Transvalvular velocity was within the normal range. There was no evidence for stenosis. There was trivial regurgitation. Left atrium: The atrium was moderately dilated. Right ventricle: The cavity size was normal. Wall thickness was normal. Systolic function was normal. Tricuspid valve: There was no regurgitation.  Pt cleared for surgery 03/24/17 by Almyra Deforest, PA   Neuro/Psych negative neurological ROS  negative psych ROS   GI/Hepatic negative GI ROS, (+)     substance abuse  ,   Endo/Other  diabetes, Type 2, Insulin DependentMorbid obesity  Renal/GU negative Renal ROS     Musculoskeletal  (+) Arthritis , Charcot's joint of right foot   Abdominal (+) + obese,   Peds  Hematology negative hematology ROS (+)   Anesthesia Other  Findings right shoulder impingement  Reproductive/Obstetrics                            Anesthesia Physical  Anesthesia Plan  ASA: III  Anesthesia Plan: General and Regional   Post-op Pain Management: GA combined w/ Regional for post-op pain   Induction: Intravenous  PONV Risk Score and Plan: 2 and Dexamethasone, Ondansetron and Midazolam  Airway Management Planned: Oral ETT  Additional Equipment:   Intra-op Plan:   Post-operative Plan: Extubation in OR  Informed Consent: I have reviewed the patients History and Physical, chart, labs and discussed the procedure including the risks, benefits and alternatives for the proposed anesthesia with the patient or authorized representative who has indicated his/her understanding and acceptance.   Dental advisory given  Plan Discussed with: CRNA  Anesthesia Plan Comments:        Anesthesia Quick Evaluation

## 2017-03-26 NOTE — Anesthesia Procedure Notes (Signed)
Procedure Name: Intubation Date/Time: 03/26/2017 12:57 PM Performed by: Scheryl Darter, CRNA Pre-anesthesia Checklist: Patient identified, Emergency Drugs available, Suction available and Patient being monitored Patient Re-evaluated:Patient Re-evaluated prior to induction Oxygen Delivery Method: Circle System Utilized Preoxygenation: Pre-oxygenation with 100% oxygen Induction Type: IV induction Ventilation: Mask ventilation without difficulty Laryngoscope Size: Miller and 2 Grade View: Grade I Tube type: Oral Tube size: 8.0 mm Number of attempts: 1 Airway Equipment and Method: Stylet and Oral airway Placement Confirmation: ETT inserted through vocal cords under direct vision,  positive ETCO2 and breath sounds checked- equal and bilateral Secured at: 24 cm Tube secured with: Tape Dental Injury: Teeth and Oropharynx as per pre-operative assessment

## 2017-03-26 NOTE — Discharge Instructions (Signed)
° ° °  Post-operative patient instructions  Shoulder Arthroscopy Rotator Cuff Repair   Ice:  Place intermittent ice or cooler pack over your shoulder, 30 minutes on and 30 minutes off.  Continue this for the first 72 hours after surgery, then save ice for use after therapy sessions or on more active days.    Weight:  You may NOT bear weight on your arm as your symptoms allow.  Motion:  Do not perform any shoulder range of motion.  Wear sling at all times.  Dressing:  Perform 1st dressing change at 2 days postoperative. A moderate amount of blood tinged drainage is to be expected.  So if you bleed through the dressing on the first or second day or if you have fevers, it is fine to change the dressing/check the wounds early and redress wound.  If it bleeds through again, or if the incisions are leaking frank blood, please call the office. May change dressing every 1-2 days thereafter to help watch wounds. Can purchase Tegaderm (or 35M Nexcare) water resistant dressings at local pharmacy / [facility].  Shower:  Light shower is ok after 2 days.  Please take shower, NO bath. Recover with gauze and ace wrap to help keep wounds protected.    Pain medication:  A narcotic pain medication has been prescribed.  Take as directed.  Typically you need narcotic pain medication more regularly during the first 3 to 5 days after surgery.  Decrease your use of the medication as the pain improves.  Narcotics can sometimes cause constipation, even after a few doses.  If you have problems with constipation, you can take an over the counter stool softener or light laxative.  If you have persistent problems, please notify your physicians office.  Physical therapy: Additional activity guidelines to be provided by your physician or physical therapist at follow-up visits.   Driving: Do not recommend driving x 2 weeks post surgical, especially if surgery performed on right side. Should not drive while taking narcotic pain  medications. It typically takes at least 2 weeks to restore sufficient neuromuscular function for normal reaction times for driving safety.   Call [phone] for questions or problems. Evenings you will be forwarded to the hospital operator.  Ask for the orthopaedic physician on call. Please call if you experience:    o Redness, foul smelling, or persistent drainage from the surgical site  o worsening shoulder pain and swelling not responsive to medication  o any calf pain and or swelling of the lower leg  o temperatures greater than 101.5 F o other questions or concerns   Thank you for allowing [place] to be a part of your care.

## 2017-03-26 NOTE — Progress Notes (Signed)
Orthopedic Tech Progress Note Patient Details:  FOCH ROSENWALD 1961-01-07 [id number]  Ortho Devices Type of Ortho Device: Shoulder abduction pillow Ortho Device/Splint Interventions: Loanne Drilling, Jaideep Pollack 03/26/2017, 2:43 PM As ordered by Dr. Erlinda Hong

## 2017-03-26 NOTE — H&P (Signed)
PREOPERATIVE H&P  Chief Complaint: right shoulder impingement  HPI: Jeffery [name] is a 56 y.o. male who presents for surgical treatment of right shoulder impingement.  He denies any changes in medical history.  Past Medical History:  Diagnosis Date  . Atrial fibrillation ([place])   . Charcot's joint of right foot   . DDD (degenerative disc disease), lumbar   . Diabetes mellitus without complication ([place])   . Fatty liver   . Hypertension   . Obesity   . Rotator cuff disorder   . Shoulder impingement, right    Past Surgical History:  Procedure Laterality Date  . AMPUTATION Left 10/02/2014   Procedure: Left Third toe amputation ;  Surgeon: [name], MD;  Location: [place];  Service: Orthopedics;  Laterality: Left;  Regular bed, wants to follow hip  . ANTERIOR CRUCIATE LIGAMENT REPAIR Right 90   reconstruction  . APPLICATION OF WOUND VAC Left 10/02/2014   Procedure: APPLICATION OF WOUND VAC; toe Surgeon: [name], MD;  Location: [place];  Service: Orthopedics;  Laterality: Left;  . I&D EXTREMITY Left 10/05/2014   Procedure: IRRIGATION AND DEBRIDEMENT LEFT FOOT;  Surgeon: [name], MD;  Location: [place];  Service: Orthopedics;  Laterality: Left;  . KNEE ARTHROSCOPY W/ ACL RECONSTRUCTION Right   . TOTAL KNEE ARTHROPLASTY Right 03/28/2015  . TOTAL KNEE ARTHROPLASTY Right 03/28/2015   Procedure: RIGHT TOTAL KNEE ARTHROPLASTY;  Surgeon: [name], MD;  Location: [place];  Service: Orthopedics;  Laterality: Right;   Social History   Socioeconomic History  . Marital status: Single    Spouse name: None  . Number of children: None  . Years of education: None  . Highest education level: None  Social Needs  . Financial resource strain: None  . Food insecurity - worry: None  . Food insecurity - inability: None  . Transportation needs - medical: None  . Transportation needs - non-medical: None  Occupational History  . Occupation: Management consultant  Tobacco Use    . Smoking status: Former Smoker    Packs/day: 0.00    Years: 38.00    Pack years: 0.00    Types: Cigarettes  . Smokeless tobacco: Never Used  Substance and Sexual Activity  . Alcohol use: No    Alcohol/week: 3.0 - 3.6 oz    Types: 5 - 6 Cans of beer per week  . Drug use: Yes    Types: Marijuana    Comment: last week ; denies 03/24/17  . Sexual activity: None  Other Topics Concern  . None  Social History Narrative   Lives alone.   Family History  Problem Relation Age of Onset  . Diabetes Father   . Hypertension Father   . Heart failure Father    No Known Allergies Prior to Admission medications   Medication Sig Start Date End Date Taking? Authorizing Provider  apixaban (ELIQUIS) 5 MG TABS tablet Take 1 tablet (5 mg total) by mouth 2 (two) times daily. 01/04/17  Yes Arnoldo Morale, MD  atorvastatin (LIPITOR) 20 MG tablet Take 1 tablet (20 mg total) by mouth daily. 11/05/16  Yes Amao, Charlane Ferretti, MD  exenatide (BYETTA 10 MCG PEN) 10 MCG/0.04ML SOPN injection Inject 0.04 mLs (10 mcg total) 2 (two) times daily with a meal into the skin. 02/15/17  Yes Arnoldo Morale, MD  Insulin Glargine (LANTUS SOLOSTAR) 100 UNIT/ML Solostar Pen INJECT 34 UNITS INTO SKIN IN THE MORNING AND AT BEDTIME 03/24/17  Yes  Arnoldo Morale, MD  losartan (COZAAR) 50 MG tablet Take 1 tablet (50 mg total) by mouth daily. 12/31/16  Yes Freeman Caldron M, PA-C  metoprolol tartrate (LOPRESSOR) 50 MG tablet Take 1.5 tablets (75 mg total) by mouth 2 (two) times daily. 03/04/17  Yes Troy Sine, MD  oxyCODONE-acetaminophen (PERCOCET/ROXICET) 5-325 MG tablet Take 1 tablet by mouth every 4 (four) hours as needed for moderate pain. Takes one tablet in the morning, 1 tablet at noon, and 1 tablet at night.    Yes [provider]  sodium chloride (OCEAN) 0.65 % SOLN nasal spray Place 1 spray into both nostrils 2 (two) times daily as needed for congestion.   Yes [provider]  Blood Glucose Monitoring Suppl (TRUE  METRIX METER) DEVI 1 each by Does not apply route 3 (three) times daily before meals. 06/19/16   Arnoldo Morale, MD  gabapentin (NEURONTIN) 300 MG capsule Take 2 capsules (600 mg total) at bedtime by mouth. When necessary for neuropathy pain 02/15/17   Arnoldo Morale, MD  glucose blood (TRUE METRIX BLOOD GLUCOSE TEST) test strip Use 3 times daily before meals 06/19/16   Arnoldo Morale, MD  Insulin Pen Needle (B-D ULTRAFINE III SHORT PEN) 31G X 8 MM MISC 1 each by Does not apply route 3 (three) times daily. 10/30/16   Arnoldo Morale, MD  Insulin Syringe-Needle U-100 (TRUEPLUS INSULIN SYRINGE) 30G X 5/16" 0.5 ML MISC Use as directed 3 times daily 12/14/14   Arnoldo Morale, MD  lidocaine (LIDODERM) 5 % Place 1 patch daily onto the skin. Remove & Discard patch within 12 hours or as directed by MD 02/15/17   Arnoldo Morale, MD  TRUEPLUS INSULIN SYRINGE 30G X 5/16" 0.5 ML MISC USE AS DIRECTED 3 TIMES DAILY 04/03/16   Tresa Garter, MD  TRUEPLUS LANCETS 28G MISC 1 each by Does not apply route 3 (three) times daily before meals. 06/19/16   Arnoldo Morale, MD     Positive ROS: All other systems have been reviewed and were otherwise negative with the exception of those mentioned in the HPI and as above.  Physical Exam: General: Alert, no acute distress Cardiovascular: No pedal edema Respiratory: No cyanosis, no use of accessory musculature GI: abdomen soft Skin: No lesions in the area of chief complaint Neurologic: Sensation intact distally Psychiatric: Patient is competent for consent with normal mood and affect Lymphatic: no lymphedema  MUSCULOSKELETAL: exam stable  Assessment: right shoulder impingement  Plan: Plan for Procedure(s): RIGHT SHOULDER ARTHROSCOPY WITH DEBRIDEMENT, SUBACROMIAL DECOMPRESSION AND DISTAL CLAVICLE EXCISION  The risks benefits and alternatives were discussed with the patient including but not limited to the risks of nonoperative treatment, versus surgical intervention  including infection, bleeding, nerve injury,  blood clots, cardiopulmonary complications, morbidity, mortality, among others, and they were willing to proceed.   Eduard Roux, MD   03/26/2017 12:15 PM

## 2017-03-27 NOTE — Anesthesia Postprocedure Evaluation (Signed)
Anesthesia Post Note  Patient: GOLDEN GILREATH  Procedure(s) Performed: RIGHT SHOULDER ARTHROSCOPY WITH DEBRIDEMENT, SUBACROMIAL DECOMPRESSION AND DISTAL CLAVICLE EXCISION (Right Shoulder)     Patient location during evaluation: PACU Anesthesia Type: Regional and General Level of consciousness: awake and alert Pain management: pain level controlled Vital Signs Assessment: post-procedure vital signs reviewed and stable Respiratory status: spontaneous breathing, nonlabored ventilation, respiratory function stable and patient connected to nasal cannula oxygen Cardiovascular status: blood pressure returned to baseline and stable Postop Assessment: no apparent nausea or vomiting Anesthetic complications: no    Last Vitals:  Vitals:   03/26/17 1545 03/26/17 1600  BP:  125/81  Pulse: 76 64  Resp: 14 13  Temp: 36.5 C 36.6 C  SpO2: 99% 97%    Last Pain:  Vitals:   03/26/17 1550  TempSrc:   PainSc: 3                  Praneeth Tabitha Tupper

## 2017-03-31 ENCOUNTER — Telehealth (INDEPENDENT_AMBULATORY_CARE_PROVIDER_SITE_OTHER): Payer: Self-pay | Admitting: Orthopaedic Surgery

## 2017-03-31 ENCOUNTER — Other Ambulatory Visit (INDEPENDENT_AMBULATORY_CARE_PROVIDER_SITE_OTHER): Payer: Self-pay

## 2017-03-31 MED ORDER — OXYCODONE-ACETAMINOPHEN 5-325 MG PO TABS: ORAL_TABLET | ORAL | 0 refills | Status: DC

## 2017-03-31 MED FILL — METOPROLOL TARTRATE 50 MG T: 50 | 30 days supply | Qty: 90 | Fill #1

## 2017-03-31 MED FILL — TRUEPLUS PEN NDL 31GX5/16: 31G X 8 MM | 30 days supply | Qty: 100 | Fill #0 | Status: TO

## 2017-03-31 MED FILL — TRUEPLUS PEN NDL 31GX5/16": 31G X 8 MM | 30 days supply | Qty: 100 | Fill #0 | Status: TO

## 2017-03-31 MED FILL — ATORVASTATIN 20 MG TABLET: 20 | 30 days supply | Qty: 30 | Fill #2

## 2017-03-31 MED FILL — LOSARTAN POTASSIUM 50 MG TA: 50 | 30 days supply | Qty: 30 | Fill #5

## 2017-03-31 NOTE — Telephone Encounter (Signed)
RX printed, pending signature, will be ready for pick up tomorrow AM

## 2017-03-31 NOTE — Telephone Encounter (Signed)
Patient called requesting an RX refill on his pain medication.  He is completely out.  OP#[phone].  Thank you

## 2017-03-31 NOTE — Telephone Encounter (Signed)
Called patient no answer. Rx will be ready for pick up tomorrow AM. [facility]

## 2017-03-31 NOTE — Telephone Encounter (Signed)
Ok #30 of percocet.  1-2 tabs po bid prn pain

## 2017-03-31 NOTE — Telephone Encounter (Signed)
Please advise 

## 2017-04-05 ENCOUNTER — Encounter (INDEPENDENT_AMBULATORY_CARE_PROVIDER_SITE_OTHER): Payer: Self-pay | Admitting: Orthopaedic Surgery

## 2017-04-05 ENCOUNTER — Ambulatory Visit (INDEPENDENT_AMBULATORY_CARE_PROVIDER_SITE_OTHER): Payer: Medicaid Other | Admitting: Orthopaedic Surgery

## 2017-04-05 DIAGNOSIS — Z9889 Other specified postprocedural states: Secondary | ICD-10-CM | POA: Insufficient documentation

## 2017-04-05 MED ORDER — OXYCODONE-ACETAMINOPHEN 5-325 MG PO TABS: ORAL_TABLET | ORAL | 0 refills | Status: DC

## 2017-04-05 NOTE — Progress Notes (Signed)
Post-Op Visit Note   Patient: Jeffery [name]           Date of Birth: 1961/01/07           MRN: [id number] Visit Date: 04/05/2017 PCP: Arnoldo Morale, MD   HPI Jeffery [name] comes in for follow-up.  10 days out right shoulder arthroscopic decompression rotator cuff repair, date of surgery 03/26/2017.  He has been compliant with his sling.  Moderate pain for which she is taking Percocet.  No fevers chills or any other systemic symptoms.  PE: Examination of his right shoulder reveals well-healing surgical portals with nylon sutures in place.  No evidence of infection.  Chief Complaint:  Chief Complaint  Patient presents with  . Right Shoulder - Routine Post Op, Pain   Visit Diagnoses:  1. S/P right rotator cuff repair     Plan: At this point we are going to have [facility] stay in his sling until he is 6 weeks postop.  We will go ahead and start him in outpatient physical therapy per rotator cuff repair protocol.  I have refilled his Percocet as he has trouble getting a ride to the office.  He is instructed to not fill this for another few days.  He will follow-up with [place] when he 6 weeks out from surgery.  Follow-Up Instructions: Return in about 5 weeks (around 05/10/2017).   Orders:  No orders of the defined types were placed in this encounter.  No orders of the defined types were placed in this encounter.   Imaging: No results found.  PMFS History: Patient Active Problem List   Diagnosis Date Noted  . S/P right rotator cuff repair 04/05/2017  . Diabetes mellitus without complication ([place]) 22/29/7989  . Achilles tendon contracture, right 01/05/2017  . Closed nondisplaced fracture of distal phalanx of right great toe 01/05/2017  . Paroxysmal atrial fibrillation ([place]) 10/30/2016  . Pain in right ankle and joints of right foot 07/09/2016  . Diabetic polyneuropathy associated with type 2 diabetes mellitus ([place]) 07/09/2016  . 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 Impingement syndrome of right shoulder 07/07/2016  . Pain in right foot 07/07/2016  . Charcot's joint of right foot 07/03/2016  . Morbid obesity ([place]) 06/05/2016  . S/P total knee replacement using cement 03/28/2015  . Knee osteoarthritis 11/12/2014  . Hypertension 10/15/2014  . Osteomyelitis of toe of left foot ([place])   . Toe osteomyelitis, left ([place]) 10/01/2014  . Charcot foot due to diabetes mellitus ([place]) 10/01/2014  . Accelerated hypertension 10/01/2014  . Gangrene left third toe 10/01/2014  . SHOULDER JOINT INSTABILITY 05/27/2010  . SHOULDER PAIN, RIGHT 05/27/2010   Past Medical History:  Diagnosis Date  . Atrial fibrillation ([place])   . Charcot's joint of right foot   . DDD (degenerative disc disease), lumbar   . Diabetes mellitus without complication ([place])   . Fatty liver   . Hypertension   . Obesity   . Rotator cuff disorder   . Shoulder impingement, right     Family History  Problem Relation Age of Onset  . Diabetes Father   . Hypertension Father   . Heart failure Father     Past Surgical History:  Procedure Laterality Date  . AMPUTATION Left 10/02/2014   Procedure: Left Third toe amputation ;  Surgeon: [name], MD;  Location: [place];  Service: Orthopedics;  Laterality: Left;  Regular bed, wants to follow hip  . ANTERIOR CRUCIATE LIGAMENT  REPAIR Right 90   reconstruction  . APPLICATION OF WOUND VAC Left 10/02/2014   Procedure: APPLICATION OF WOUND VAC; toe Surgeon: [name], MD;  Location: [place];  Service: Orthopedics;  Laterality: Left;  . I&D EXTREMITY Left 10/05/2014   Procedure: IRRIGATION AND DEBRIDEMENT LEFT FOOT;  Surgeon: [name], MD;  Location: [place];  Service: Orthopedics;  Laterality: Left;  . KNEE ARTHROSCOPY W/ ACL RECONSTRUCTION Right   . TOTAL KNEE ARTHROPLASTY Right 03/28/2015  . TOTAL KNEE ARTHROPLASTY Right 03/28/2015   Procedure: RIGHT TOTAL KNEE ARTHROPLASTY;  Surgeon: [name], MD;   Location: [place];  Service: Orthopedics;  Laterality: Right;   Social History   Occupational History  . Occupation: Management consultant  Tobacco Use  . Smoking status: Former Smoker    Packs/day: 0.00    Years: 38.00    Pack years: 0.00    Types: Cigarettes  . Smokeless tobacco: Never Used  Substance and Sexual Activity  . Alcohol use: No    Alcohol/week: 3.0 - 3.6 oz    Types: 5 - 6 Cans of beer per week  . Drug use: Yes    Types: Marijuana    Comment: last week ; denies 03/24/17  . Sexual activity: Not on file

## 2017-04-08 ENCOUNTER — Encounter (HOSPITAL_BASED_OUTPATIENT_CLINIC_OR_DEPARTMENT_OTHER): Payer: Medicaid Other

## 2017-04-13 ENCOUNTER — Ambulatory Visit: Payer: Medicaid Other | Attending: Orthopaedic Surgery | Admitting: Physical Therapy

## 2017-04-13 ENCOUNTER — Encounter: Payer: Self-pay | Admitting: Physical Therapy

## 2017-04-13 DIAGNOSIS — M25511 Pain in right shoulder: Secondary | ICD-10-CM

## 2017-04-13 DIAGNOSIS — R293 Abnormal posture: Secondary | ICD-10-CM | POA: Diagnosis present

## 2017-04-13 DIAGNOSIS — M6281 Muscle weakness (generalized): Secondary | ICD-10-CM

## 2017-04-13 DIAGNOSIS — M25611 Stiffness of right shoulder, not elsewhere classified: Secondary | ICD-10-CM

## 2017-04-13 NOTE — Therapy (Addendum)
[place], [place], [address] Phone: [phone]   Fax:  [phone]  Physical Therapy Evaluation  Patient Details  Name: Jeffery [name] MRN: [id number] Date of Birth: Jun 26, 1960 Referring Provider: Dr. Frankey Shown    Encounter Date: 04/13/2017  PT End of Session - 04/13/17 1417    Visit Number  1    Number of Visits  15    Date for PT Re-Evaluation  06/08/17    Authorization Type  Medicaid    PT Start Time  1340    PT Stop Time  1423    PT Time Calculation (min)  43 min    Activity Tolerance  Patient tolerated treatment well    Behavior During Therapy  [facility] for tasks assessed/performed       Past Medical History:  Diagnosis Date  . Atrial fibrillation ([place])   . Charcot's joint of right foot   . DDD (degenerative disc disease), lumbar   . Diabetes mellitus without complication ([place])   . Fatty liver   . Hypertension   . Obesity   . Rotator cuff disorder   . Shoulder impingement, right     Past Surgical History:  Procedure Laterality Date  . AMPUTATION Left 10/02/2014   Procedure: Left Third toe amputation ;  Surgeon: [name], MD;  Location: [place];  Service: Orthopedics;  Laterality: Left;  Regular bed, wants to follow hip  . ANTERIOR CRUCIATE LIGAMENT REPAIR Right 90   reconstruction  . APPLICATION OF WOUND VAC Left 10/02/2014   Procedure: APPLICATION OF WOUND VAC; toe Surgeon: [name], MD;  Location: [place];  Service: Orthopedics;  Laterality: Left;  . I&D EXTREMITY Left 10/05/2014   Procedure: IRRIGATION AND DEBRIDEMENT LEFT FOOT;  Surgeon: [name], MD;  Location: [place];  Service: Orthopedics;  Laterality: Left;  . KNEE ARTHROSCOPY W/ ACL RECONSTRUCTION Right   . TOTAL KNEE ARTHROPLASTY Right 03/28/2015  . TOTAL KNEE ARTHROPLASTY Right 03/28/2015   Procedure: RIGHT TOTAL KNEE ARTHROPLASTY;  Surgeon: [name], MD;  Location: [place];  Service: Orthopedics;  Laterality: Right;     There were no vitals filed for this visit.   Subjective Assessment - 04/13/17 1343    Subjective  03/26/17 underwent Rt. shoulder debridement and rotator cuff repair.  ADLs, mobility are affected by his condition.  He is Rt. handed.  He is unable to drive.  He works 4 hours a day and can modify what he does but he is not currently working.  He is hopeful he can retunr to work .     Pertinent History  R TKA 03-08-15, Charcot foot 1/17    Low back pain since 57 yo, A-fib, DM2, HBP    Currently in Pain?  Yes    Pain Score  6     Pain Location  Shoulder    Pain Orientation  Right    Pain Descriptors / Indicators  Dull;Aching    Pain Type  Surgical pain    Pain Onset  More than a month ago    Pain Frequency  Constant    Aggravating Factors   using arm, dependent position     Pain Relieving Factors  pain meds, ice     Effect of Pain on Daily Activities  unable to use R. UE for ADLs, driving                  Objective measurements completed on examination:  See above findings.              PT Education - 04/13/17 1416    Education provided  Yes    Education Details  PT/POC, HEP and posture, sling ex, AAROM with cane     Person(s) Educated  Patient    Methods  Explanation;Demonstration;Tactile cues;Verbal cues;Handout    Comprehension  Verbalized understanding;Returned demonstration;Need further instruction       PT Short Term Goals - 04/13/17 1526      PT SHORT TERM GOAL #1   Title  Pt will be I with initial HEP     Baseline  given on eval     Time  4    Period  Weeks    Status  New    Target Date  05/11/17      PT SHORT TERM GOAL #2   Title  Pt will tolerate PROM to near full with min pain overall    Baseline  mod pain, lacks flexion, abduction, external rotation and intermal rotation, see note    Time  4    Period  Weeks    Status  New    Target Date  05/11/17      PT SHORT TERM GOAL #3   Title  Pt will be able to type, do puzzles, and othe fine  motor skills without pain increase.     Baseline  min pain, modifies and repositions     Time  4    Period  Weeks    Status  New    Target Date  05/11/17        PT Long Term Goals - 04/13/17 1528      PT LONG TERM GOAL #1   Title  Pt will understand posture, body mechanics, RICE and lifting to prevent further disability.     Baseline  unknown, needs reinforcement     Time  8    Period  Weeks    Status  New    Target Date  06/08/17      PT LONG TERM GOAL #2   Title  Pt will be I with more advanced HEP for Rt UE strength and ROM.     Baseline  unknown, given inital on eval     Time  8    Period  Weeks    Status  New    Target Date  06/08/17      PT LONG TERM GOAL #3   Title  Pt will be able to lift 10 lbs just above shoulder height with Rt. UE for improved work and home tasks.     Baseline  unable, not permitted by MD protocol     Time  8    Period  Weeks    Status  New    Target Date  06/08/17      PT LONG TERM GOAL #4   Title  Pt will be able to reach behind his back and behind his head for improved ability to complete ADLs, grooming.     Baseline  pain, unable to reach back and head due to stiffness     Time  8    Period  Weeks    Status  New    Target Date  06/08/17      PT LONG TERM GOAL #5   Title  Pt will demo 4+/5 or more strength throughout Rt. UE for maximal function to allow safe return to work  Baseline  unable to test due to surgical precautions     Time  8    Period  Weeks    Status  New    Target Date  06/08/17             Plan - 04/13/17 1533    Clinical Impression Statement  Pt with low complexity eval of Rt shoulder post debridement, SAD and repair of rotator cuff (supraspinatus)  on 03/26/17.  He is having the expected difficulty of pain, strength, ROM necessary to complete home tasks, ADLs, work and recreational activites.  He has a complex medical history including low back pain and diabetes.  He is doing well despite that, able to  reach to 140 deg with AAROM in sitting position.  His pain was min to moderate without pain meds.  He should do well with corrective exercises and skilled PT to maximize full shoulder function.     History and Personal Factors relevant to plan of care:  DM type II, Rt. TKA, Rt. Charcot foot, spinal stenosis     Clinical Presentation  Stable    Clinical Decision Making  Low    Rehab Potential  Excellent    PT Frequency  2x / week 3 visits initially then will ask for auth for 12 more if progressing     PT Duration  8 weeks    PT Treatment/Interventions  ADLs/Self Care Home Management;Cryotherapy;Ultrasound;Moist Heat;Electrical Stimulation;Balance training;Therapeutic exercise;Therapeutic activities;Functional mobility training;Neuromuscular re-education;Patient/family education;Manual techniques;Taping;Passive range of motion;Vasopneumatic Device    PT Next Visit Plan  check HEP, follow protocol , modalites as needed, manual     PT Home Exercise Plan  AAROM cane ex, pendulums     Consulted and Agree with Plan of Care  Patient       Patient will benefit from skilled therapeutic intervention in order to improve the following deficits and impairments:  Decreased mobility, Hypomobility, Obesity, Improper body mechanics, Pain, Postural dysfunction, Impaired UE functional use, Impaired flexibility, Increased fascial restricitons, Decreased strength, Decreased range of motion  Visit Diagnosis: Muscle weakness (generalized) - Plan: PT plan of care cert/re-cert  Stiffness of right shoulder, not elsewhere classified - Plan: PT plan of care cert/re-cert  Acute pain of right shoulder - Plan: PT plan of care cert/re-cert  Abnormal posture - Plan: PT plan of care cert/re-cert     Problem List Patient Active Problem List   Diagnosis Date Noted  . S/P right rotator cuff repair 04/05/2017  . Diabetes mellitus without complication ([place]) 08/65/7846  . Achilles tendon contracture, right 01/05/2017  .  Closed nondisplaced fracture of distal phalanx of right great toe 01/05/2017  . Paroxysmal atrial fibrillation ([place]) 10/30/2016  . Pain in right ankle and joints of right foot 07/09/2016  . Diabetic polyneuropathy associated with type 2 diabetes mellitus ([place]) 07/09/2016  . 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 Impingement syndrome of right shoulder 07/07/2016  . Pain in right foot 07/07/2016  . Charcot's joint of right foot 07/03/2016  . Morbid obesity ([place]) 06/05/2016  . S/P total knee replacement using cement 03/28/2015  . Knee osteoarthritis 11/12/2014  . Hypertension 10/15/2014  . Osteomyelitis of toe of left foot ([place])   . Toe osteomyelitis, left ([place]) 10/01/2014  . Charcot foot due to diabetes mellitus ([place]) 10/01/2014  . Accelerated hypertension 10/01/2014  . Gangrene left third toe 10/01/2014  . SHOULDER JOINT INSTABILITY 05/27/2010  . SHOULDER PAIN, RIGHT 05/27/2010    Jeffery [name] 04/14/2017,  3:05 PM  [facility] [address] [place], [place], [address] Phone: [phone]   Fax:  [phone]  Name: Jeffery [name] MRN: [id number] Date of Birth: 1960-06-14   Raeford Razor, PT 04/14/17 3:05 PM Phone: [phone] Fax: [phone]  Raeford Razor, PT 04/14/17 3:05 PM Phone: [phone] Fax: [phone]

## 2017-04-13 NOTE — Patient Instructions (Signed)
.  Cane Overhead - Supine  Hold cane at thighs with both hands, extend arms straight over head. Hold __10_ seconds. Repeat _10_ times. Do 2___ times per day.  External Rotation (Eccentric), Active-Assist - Supine (Cane)  Lie on back, affected arm out from side, elbow at 90, forearm forward. Use cane to assist in lifting forearm of affected arm to neutral. Slowly lower for 3-5 seconds. __10_ reps per set, _2__ sets per day, _7__ days per week. Copyright  VHI. All rights reserved.   Chest press x 10 with cane   Pendulums  Table slides  ICE PACKS   Posture

## 2017-04-16 LAB — SPECIMEN STATUS REPORT

## 2017-04-22 MED FILL — tiZANidine HCL 4 MG TABS: 4 | 30 days supply | Qty: 30 | Fill #2 | Status: TO

## 2017-04-22 MED FILL — LIDOCAINE PATCH 5%: 5 | 30 days supply | Qty: 30 | Fill #1 | Status: TO

## 2017-04-22 MED FILL — LANTUS SOLOSTAR 100 UNITS/M: 100 | 22 days supply | Qty: 15 | Fill #1 | Status: TO

## 2017-04-23 LAB — COMPREHENSIVE METABOLIC PANEL

## 2017-04-23 LAB — CBC
Hematocrit: 42.9 % (ref 37.5–51.0)
Hemoglobin: 14.7 g/dL (ref 13.0–17.7)
MCH: 28.6 pg (ref 26.6–33.0)
MCHC: 34.3 g/dL (ref 31.5–35.7)
MCV: 84 fL (ref 79–97)
Platelets: 221 10*3/uL (ref 150–379)
RBC: 5.14 x10E6/uL (ref 4.14–5.80)
RDW: 14 % (ref 12.3–15.4)
WBC: 9.8 10*3/uL (ref 3.4–10.8)

## 2017-04-23 LAB — LIPID PANEL

## 2017-04-23 LAB — TSH: TSH: 2.14 u[IU]/mL (ref 0.450–4.500)

## 2017-04-23 LAB — MAGNESIUM

## 2017-04-26 ENCOUNTER — Telehealth (INDEPENDENT_AMBULATORY_CARE_PROVIDER_SITE_OTHER): Payer: Self-pay

## 2017-04-26 NOTE — Telephone Encounter (Signed)
See message.

## 2017-04-26 NOTE — Telephone Encounter (Signed)
Tramadol #30.  1-2 tid prn

## 2017-04-26 NOTE — Telephone Encounter (Signed)
Patient would like a Rx for pain.  Cb# is [phone].  Please advise.  Thank you.

## 2017-04-27 ENCOUNTER — Ambulatory Visit: Payer: Medicaid Other | Admitting: Physical Therapy

## 2017-04-27 ENCOUNTER — Encounter: Payer: Self-pay | Admitting: Physical Therapy

## 2017-04-27 DIAGNOSIS — M25511 Pain in right shoulder: Secondary | ICD-10-CM

## 2017-04-27 DIAGNOSIS — M6281 Muscle weakness (generalized): Secondary | ICD-10-CM | POA: Diagnosis not present

## 2017-04-27 DIAGNOSIS — R293 Abnormal posture: Secondary | ICD-10-CM

## 2017-04-27 DIAGNOSIS — M25611 Stiffness of right shoulder, not elsewhere classified: Secondary | ICD-10-CM

## 2017-04-27 MED ORDER — TRAMADOL HCL 50 MG PO TABS: ORAL_TABLET | ORAL | 0 refills | Status: DC

## 2017-04-27 NOTE — Patient Instructions (Signed)
Strengthening: Isometric Flexion  Using wall for resistance, press right fist into ball using light pressure. Hold ____ seconds. Repeat ____ times per set. Do ____ sets per session. Do ____ sessions per day.  SHOULDER: Abduction (Isometric)  Use wall as resistance. Press arm against pillow. Keep elbow straight. Hold ___ seconds. ___ reps per set, ___ sets per day, ___ days per week  Extension (Isometric)  Place left bent elbow and back of arm against wall. Press elbow against wall. Hold ____ seconds. Repeat ____ times. Do ____ sessions per day.  Internal Rotation (Isometric)  Place palm of right fist against door frame, with elbow bent. Press fist against door frame. Hold ____ seconds. Repeat ____ times. Do ____ sessions per day.  External Rotation (Isometric)  Place back of left fist against door frame, with elbow bent. Press fist against door frame. Hold ____ seconds. Repeat ____ times. Do ____ sessions per day.  Copyright  VHI. All rights reserved.    

## 2017-04-27 NOTE — Telephone Encounter (Signed)
I called patient advise we would send in rx for tramadol, this was called into [facility] on [address].

## 2017-04-27 NOTE — Therapy (Signed)
[place], [place], [address] Phone: [phone]   Fax:  [phone]  Physical Therapy Treatment  Patient Details  Name: Jeffery [name] MRN: [id number] Date of Birth: 1961/01/17 Referring Provider: Dr. Frankey Shown    Encounter Date: 04/27/2017  PT End of Session - 04/27/17 1334    Visit Number  2    Number of Visits  15    Date for PT Re-Evaluation  06/08/17    Authorization Type  Medicaid    Authorization Time Period  1/23 to 2/12    Authorization - Visit Number  1    Authorization - Number of Visits  3    PT Start Time  1331    PT Stop Time  1421    PT Time Calculation (min)  50 min    Activity Tolerance  Patient tolerated treatment well    Behavior During Therapy  [facility] for tasks assessed/performed       Past Medical History:  Diagnosis Date  . Atrial fibrillation ([place])   . Charcot's joint of right foot   . DDD (degenerative disc disease), lumbar   . Diabetes mellitus without complication ([place])   . Fatty liver   . Hypertension   . Obesity   . Rotator cuff disorder   . Shoulder impingement, right     Past Surgical History:  Procedure Laterality Date  . AMPUTATION Left 10/02/2014   Procedure: Left Third toe amputation ;  Surgeon: [name], MD;  Location: [place];  Service: Orthopedics;  Laterality: Left;  Regular bed, wants to follow hip  . ANTERIOR CRUCIATE LIGAMENT REPAIR Right 90   reconstruction  . APPLICATION OF WOUND VAC Left 10/02/2014   Procedure: APPLICATION OF WOUND VAC; toe Surgeon: [name], MD;  Location: [place];  Service: Orthopedics;  Laterality: Left;  . I&D EXTREMITY Left 10/05/2014   Procedure: IRRIGATION AND DEBRIDEMENT LEFT FOOT;  Surgeon: [name], MD;  Location: [place];  Service: Orthopedics;  Laterality: Left;  . KNEE ARTHROSCOPY W/ ACL RECONSTRUCTION Right   . TOTAL KNEE ARTHROPLASTY Right 03/28/2015  . TOTAL KNEE ARTHROPLASTY Right 03/28/2015   Procedure:  RIGHT TOTAL KNEE ARTHROPLASTY;  Surgeon: [name], MD;  Location: [place];  Service: Orthopedics;  Laterality: Right;    There were no vitals filed for this visit.  Subjective Assessment - 04/27/17 1333    Subjective  Been doing exercises.  took a pill this AM (back more than arm).      Currently in Pain?  Yes    Pain Score  5     Pain Location  Shoulder    Pain Orientation  Right sup/ant    Pain Descriptors / Indicators  Other (Comment);Aching pulls    Pain Onset  More than a month ago    Pain Frequency  Intermittent        [facility] Adult PT Treatment/Exercise - 04/27/17 0001      Shoulder Exercises: Supine   Horizontal ABduction  AAROM;10 reps    External Rotation  AAROM;Right;10 reps    Flexion  AAROM;Both;10 reps    Other Supine Exercises  chest press x 10 wand/cane       Shoulder Exercises: Seated   Horizontal ABduction  AAROM;Right;20 reps    Horizontal ABduction Weight (lbs)  UE ranger    External Rotation  AAROM;Right;15 reps    Flexion  AAROM;Right;20 reps    Abduction  AAROM;Right;15 reps  Other Seated Exercises  UE ranger for above ex       Shoulder Exercises: Pulleys   Flexion  3 minutes    ABduction  3 minutes    Other Pulley Exercises  IR 1 min standing       Shoulder Exercises: Isometric Strengthening   Flexion  5X5"    Extension  5X5"    External Rotation  5X5"    Internal Rotation  5X5"    ABduction  5X5"      Modalities   Modalities  Cryotherapy      Cryotherapy   Number Minutes Cryotherapy  10 Minutes    Cryotherapy Location  Shoulder    Type of Cryotherapy  Ice pack      Manual Therapy   Manual Therapy  Joint mobilization;Passive ROM    Joint Mobilization  A/P gentle, inf glides Gr I-II     Passive ROM  all planes                PT Short Term Goals - 04/27/17 1417      PT SHORT TERM GOAL #1   Title  Pt will be I with initial HEP     Status  Achieved      PT SHORT TERM GOAL #2   Title  Pt will tolerate PROM to near full  with min pain overall    Status  Achieved      PT SHORT TERM GOAL #3   Title  Pt will be able to type, do puzzles, and othe fine motor skills without pain increase.     Baseline  min pain, modifies and repositions     Status  On-going        PT Long Term Goals - 04/27/17 1417      PT LONG TERM GOAL #1   Title  Pt will understand posture, body mechanics, RICE and lifting to prevent further disability.     Status  On-going      PT LONG TERM GOAL #2   Title  Pt will be I with more advanced HEP for Rt UE strength and ROM.     Status  On-going      PT LONG TERM GOAL #3   Title  Pt will be able to lift 10 lbs just above shoulder height with Rt. UE for improved work and home tasks.     Status  On-going      PT LONG TERM GOAL #4   Title  Pt will be able to reach behind his back and behind his head for improved ability to complete ADLs, grooming.     Status  On-going      PT LONG TERM GOAL #5   Title  Pt will demo 4+/5 or more strength throughout Rt. UE for maximal function to allow safe return to work     Status  On-going            Plan - 04/27/17 1418    Clinical Impression Statement  Patient is doing well,demonstrates increased  AROM and pparent strength.  He called for pain medicine today for rehab but did not receive a call back.  Pain was moderate but did not interfere with session.  He was given isometrics for maintaining strength until he is cleared for more dynamic strengthening. See goals met.     PT Treatment/Interventions  ADLs/Self Care Home Management;Cryotherapy;Ultrasound;Moist Heat;Electrical Stimulation;Balance training;Therapeutic exercise;Therapeutic activities;Functional mobility training;Neuromuscular re-education;Patient/family education;Manual techniques;Taping;Passive range of motion;Vasopneumatic Device  PT Next Visit Plan  check HEP, follow protocol , modalites as needed, manual     PT Home Exercise Plan  AAROM cane ex, pendulums  , isometrics      Consulted and Agree with Plan of Care  Patient       Patient will benefit from skilled therapeutic intervention in order to improve the following deficits and impairments:  Decreased mobility, Hypomobility, Obesity, Improper body mechanics, Pain, Postural dysfunction, Impaired UE functional use, Impaired flexibility, Increased fascial restricitons, Decreased strength, Decreased range of motion  Visit Diagnosis: Muscle weakness (generalized)  Stiffness of right shoulder, not elsewhere classified  Acute pain of right shoulder  Abnormal posture     Problem List Patient Active Problem List   Diagnosis Date Noted  . S/P right rotator cuff repair 04/05/2017  . Diabetes mellitus without complication ([place]) 62/82/4175  . Achilles tendon contracture, right 01/05/2017  . Closed nondisplaced fracture of distal phalanx of right great toe 01/05/2017  . Paroxysmal atrial fibrillation ([place]) 10/30/2016  . Pain in right ankle and joints of right foot 07/09/2016  . Diabetic polyneuropathy associated with type 2 diabetes mellitus ([place]) 07/09/2016  . 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 Impingement syndrome of right shoulder 07/07/2016  . Pain in right foot 07/07/2016  . Charcot's joint of right foot 07/03/2016  . Morbid obesity ([place]) 06/05/2016  . S/P total knee replacement using cement 03/28/2015  . Knee osteoarthritis 11/12/2014  . Hypertension 10/15/2014  . Osteomyelitis of toe of left foot ([place])   . Toe osteomyelitis, left ([place]) 10/01/2014  . Charcot foot due to diabetes mellitus ([place]) 10/01/2014  . Accelerated hypertension 10/01/2014  . Gangrene left third toe 10/01/2014  . SHOULDER JOINT INSTABILITY 05/27/2010  . SHOULDER PAIN, RIGHT 05/27/2010    Regie Bunner 04/27/2017, 8:44 PM  [facility] [facility] [address] [place], [place], [address] Phone: [phone]   Fax:   [phone]  Name: CONNY MOENING MRN: [id number] Date of Birth: 1961-02-18   Raeford Razor, PT 04/27/17 8:44 PM Phone: [phone] Fax: [phone]

## 2017-04-27 NOTE — Addendum Note (Signed)
Addended by: Maxcine Ham on: 04/27/2017 02:33 PM   Modules accepted: Orders

## 2017-04-29 ENCOUNTER — Encounter: Payer: Self-pay | Admitting: *Deleted

## 2017-05-03 MED FILL — BYETTA 10 MCG DOSE PEN INJ: 10 | 30 days supply | Qty: 2 | Fill #2 | Status: TO

## 2017-05-03 MED FILL — ELIQUIS 5 MG TABLET: 5 | 30 days supply | Qty: 60 | Fill #2 | Status: TO

## 2017-05-03 MED FILL — METOPROLOL TARTRATE 50 MG T: 50 | 30 days supply | Qty: 90 | Fill #2 | Status: TO

## 2017-05-03 MED FILL — LOSARTAN POTASSIUM 50 MG TA: 50 | 30 days supply | Qty: 30 | Fill #0 | Status: TO

## 2017-05-03 MED FILL — ATORVASTATIN 20 MG TABLET: 20 | 30 days supply | Qty: 30 | Fill #3 | Status: TO

## 2017-05-04 ENCOUNTER — Encounter: Payer: Self-pay | Admitting: Physical Therapy

## 2017-05-04 ENCOUNTER — Ambulatory Visit: Payer: Medicaid Other | Attending: Orthopaedic Surgery | Admitting: Physical Therapy

## 2017-05-04 DIAGNOSIS — M5440 Lumbago with sciatica, unspecified side: Secondary | ICD-10-CM | POA: Diagnosis present

## 2017-05-04 DIAGNOSIS — R293 Abnormal posture: Secondary | ICD-10-CM | POA: Insufficient documentation

## 2017-05-04 DIAGNOSIS — M25611 Stiffness of right shoulder, not elsewhere classified: Secondary | ICD-10-CM

## 2017-05-04 DIAGNOSIS — G8929 Other chronic pain: Secondary | ICD-10-CM | POA: Diagnosis present

## 2017-05-04 DIAGNOSIS — M6281 Muscle weakness (generalized): Secondary | ICD-10-CM | POA: Diagnosis present

## 2017-05-04 DIAGNOSIS — M25511 Pain in right shoulder: Secondary | ICD-10-CM | POA: Insufficient documentation

## 2017-05-04 NOTE — Patient Instructions (Signed)
Low Row: Standing    Face anchor, feet shoulder width apart. Palms up, pull arms back, squeezing shoulder blades together. Repeat 20__ times per set. Do __2 sets per session. Do _5_ sessions per week. Anchor Height: Waist  [email or web address]   Copyright  VHI. All rights reserved.   Do with straight arms, keep chest lifted.  X 20 reps red band

## 2017-05-04 NOTE — Therapy (Addendum)
[place], [place], [address] Phone: [phone]   Fax:  [phone]  Physical Therapy Treatment  Patient Details  Name: Jeffery [name] MRN: [id number] Date of Birth: 07/31/1960 Referring Provider: Dr. Frankey Shown    Encounter Date: 05/04/2017  PT End of Session - 05/04/17 1420    Visit Number  3    Number of Visits  15    Date for PT Re-Evaluation  06/08/17    Authorization Type  Medicaid    Authorization Time Period  1/23 to 2/12    Authorization - Visit Number  2    Authorization - Number of Visits  3    PT Start Time  1330    PT Stop Time  1424    PT Time Calculation (min)  54 min    Activity Tolerance  Patient tolerated treatment well    Behavior During Therapy  [facility] for tasks assessed/performed       Past Medical History:  Diagnosis Date  . Atrial fibrillation ([place])   . Charcot's joint of right foot   . DDD (degenerative disc disease), lumbar   . Diabetes mellitus without complication ([place])   . Fatty liver   . Hypertension   . Obesity   . Rotator cuff disorder   . Shoulder impingement, right     Past Surgical History:  Procedure Laterality Date  . AMPUTATION Left 10/02/2014   Procedure: Left Third toe amputation ;  Surgeon: [name], MD;  Location: [place];  Service: Orthopedics;  Laterality: Left;  Regular bed, wants to follow hip  . ANTERIOR CRUCIATE LIGAMENT REPAIR Right 90   reconstruction  . APPLICATION OF WOUND VAC Left 10/02/2014   Procedure: APPLICATION OF WOUND VAC; toe Surgeon: [name], MD;  Location: [place];  Service: Orthopedics;  Laterality: Left;  . I&D EXTREMITY Left 10/05/2014   Procedure: IRRIGATION AND DEBRIDEMENT LEFT FOOT;  Surgeon: [name], MD;  Location: [place];  Service: Orthopedics;  Laterality: Left;  . KNEE ARTHROSCOPY W/ ACL RECONSTRUCTION Right   . TOTAL KNEE ARTHROPLASTY Right 03/28/2015  . TOTAL KNEE ARTHROPLASTY Right 03/28/2015   Procedure:  RIGHT TOTAL KNEE ARTHROPLASTY;  Surgeon: [name], MD;  Location: [place];  Service: Orthopedics;  Laterality: Right;    There were no vitals filed for this visit.  Subjective Assessment - 05/04/17 1333    Subjective  I helped a friend do some yardwork, raking.  Using Rt arm only minimally .  Blower is below shoulder height and it just swing it side to side easy.  Uses ice and heat.      Currently in Pain?  Yes    Pain Location  Shoulder    Pain Orientation  Right    Pain Type  Surgical pain    Pain Onset  More than a month ago    Pain Frequency  Intermittent    Aggravating Factors   over use     Pain Relieving Factors  pain meds, ice          [facility] PT Assessment - 05/04/17 0001      AROM   Right Shoulder Flexion  145 Degrees    Right Shoulder ABduction  125 Degrees    Right Shoulder Internal Rotation  5 Degrees    Right Shoulder External Rotation  60 Degrees at 45 deg abd       Strength   Right Shoulder Flexion  3+/5  Right Shoulder ABduction  3/5    Right Shoulder Internal Rotation  4+/5    Right Shoulder External Rotation  4/5           [facility] Adult PT Treatment/Exercise - 05/04/17 0001      Shoulder Exercises: Standing   Extension  Strengthening;20 reps;Theraband    Theraband Level (Shoulder Extension)  Level 2 (Red)    Row  Strengthening;Both;20 reps;Theraband    Theraband Level (Shoulder Row)  Level 2 (Red)    Other Standing Exercises  UE Ranger flexion , scaption and horiz abd, adduction to tolerance , incr pain with abd       Shoulder Exercises: Isometric Strengthening   Flexion  5X5"    External Rotation  5X5"    Internal Rotation  --    ABduction  5X5"      Modalities   Modalities  Cryotherapy      Cryotherapy   Number Minutes Cryotherapy  10 Minutes    Cryotherapy Location  Shoulder    Type of Cryotherapy  Ice pack      Manual Therapy   Manual Therapy  Joint mobilization;Passive ROM    Joint Mobilization  A/P gentle, inf glides Gr I-II   distraction , oscillation     Passive ROM  all planes                PT Short Term Goals - 05/04/17 1414      PT SHORT TERM GOAL #1   Title  Pt will be I with initial HEP     Baseline  goal met     Status  Achieved      PT SHORT TERM GOAL #2   Title  Pt will tolerate PROM to near full with min pain overall    Baseline  goal met     Time  4    Period  Weeks    Status  Achieved      PT SHORT TERM GOAL #3   Title  Pt will be able to type, do puzzles, and othe fine motor skills without pain increase.     Baseline  goal met     Status  Achieved        PT Long Term Goals - 05/04/17 1415      PT LONG TERM GOAL #1   Title  Pt will understand posture, body mechanics, RICE and lifting to prevent further disability.     Baseline  needs reinforcement for upper back posture in sitting     Status  On-going      PT LONG TERM GOAL #2   Title  Pt will be I with more advanced HEP for Rt UE strength and ROM.     Baseline  unknown, I with inital on eval as much as protocol allows     Status  On-going      PT LONG TERM GOAL #3   Title  Pt will be able to lift 10 lbs just above shoulder height with Rt. UE for improved work and home tasks.     Baseline  unable, not permitted by MD protocol     Status  On-going      PT LONG TERM GOAL #4   Title  Pt will be able to reach behind his back and behind his head for improved ability to complete ADLs, grooming.     Baseline  improving , unable to reach back and head due to stiffness  Status  On-going      PT LONG TERM GOAL #5   Title  Pt will demo 4+/5 or more strength throughout Rt. UE for maximal function to allow safe return to work     Baseline  3/5 to 3+/5 in flexion and abduction     Status  On-going            Plan - 05/04/17 1525    Clinical Impression Statement  Mr. Bartell continues to improve in all aspects of mobility of Rt. UE.  He shows great potential in full recovery. He has met all STGs.   He is weak in all  planes of motion, is somewhat limtied in what strengthening exercises can be done at this point.  He is almost 6 weeks post op and is limited in what he is allowed to physically do with his Rt UE.  He will benefit from contoinued PT to address deficits in strength, ROM, pain and overall mobility.      PT Frequency  2x / week    PT Duration  6 weeks    PT Treatment/Interventions  ADLs/Self Care Home Management;Cryotherapy;Ultrasound;Moist Heat;Electrical Stimulation;Balance training;Therapeutic exercise;Therapeutic activities;Functional mobility training;Neuromuscular re-education;Patient/family education;Manual techniques;Taping;Passive range of motion;Vasopneumatic Device    PT Next Visit Plan  check HEP, follow protocol , modalites as needed, manual     PT Home Exercise Plan  AAROM cane ex, pendulums  , isometrics , row and extension     Consulted and Agree with Plan of Care  Patient       Patient will benefit from skilled therapeutic intervention in order to improve the following deficits and impairments:  Decreased mobility, Hypomobility, Obesity, Improper body mechanics, Pain, Postural dysfunction, Impaired UE functional use, Impaired flexibility, Increased fascial restricitons, Decreased strength, Decreased range of motion  Visit Diagnosis: Muscle weakness (generalized)  Stiffness of right shoulder, not elsewhere classified  Acute pain of right shoulder  Abnormal posture  Chronic right-sided low back pain with sciatica, sciatica laterality unspecified     Problem List Patient Active Problem List   Diagnosis Date Noted  . S/P right rotator cuff repair 04/05/2017  . Diabetes mellitus without complication ([place]) 36/62/9476  . Achilles tendon contracture, right 01/05/2017  . Closed nondisplaced fracture of distal phalanx of right great toe 01/05/2017  . Paroxysmal atrial fibrillation ([place]) 10/30/2016  . Pain in right ankle and joints of right foot 07/09/2016  . Diabetic  polyneuropathy associated with type 2 diabetes mellitus ([place]) 07/09/2016  . 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 Impingement syndrome of right shoulder 07/07/2016  . Pain in right foot 07/07/2016  . Charcot's joint of right foot 07/03/2016  . Morbid obesity ([place]) 06/05/2016  . S/P total knee replacement using cement 03/28/2015  . Knee osteoarthritis 11/12/2014  . Hypertension 10/15/2014  . Osteomyelitis of toe of left foot ([place])   . Toe osteomyelitis, left ([place]) 10/01/2014  . Charcot foot due to diabetes mellitus ([place]) 10/01/2014  . Accelerated hypertension 10/01/2014  . Gangrene left third toe 10/01/2014  . SHOULDER JOINT INSTABILITY 05/27/2010  . SHOULDER PAIN, RIGHT 05/27/2010    Debbera Wolken 05/04/2017, 3:35 PM  [facility] [address] [place], [place], [address] Phone: [phone]   Fax:  [phone]  Name: VEGA WITHROW MRN: [id number] Date of Birth: 1960-12-06  Raeford Razor, PT 05/04/17 3:35 PM Phone: [phone] Fax: [phone]

## 2017-05-09 ENCOUNTER — Encounter (HOSPITAL_BASED_OUTPATIENT_CLINIC_OR_DEPARTMENT_OTHER): Payer: Medicaid Other

## 2017-05-10 ENCOUNTER — Encounter (INDEPENDENT_AMBULATORY_CARE_PROVIDER_SITE_OTHER): Payer: Self-pay | Admitting: Orthopaedic Surgery

## 2017-05-10 ENCOUNTER — Other Ambulatory Visit: Payer: Self-pay | Admitting: Pharmacist

## 2017-05-10 ENCOUNTER — Ambulatory Visit (INDEPENDENT_AMBULATORY_CARE_PROVIDER_SITE_OTHER): Payer: Medicaid Other | Admitting: Orthopaedic Surgery

## 2017-05-10 DIAGNOSIS — M7541 Impingement syndrome of right shoulder: Secondary | ICD-10-CM

## 2017-05-10 DIAGNOSIS — Z9889 Other specified postprocedural states: Secondary | ICD-10-CM

## 2017-05-10 MED ORDER — ATORVASTATIN CALCIUM 20 MG PO TABS: 20.0000 mg | ORAL_TABLET | Freq: Every day | ORAL | 2 refills | Status: DC

## 2017-05-10 NOTE — Progress Notes (Signed)
Patient is 6 weeks status post right shoulder scope rotator cuff repair.  He is doing very well.  He is progressing very well with his range of motion.  His strength is improving also.  At this point he may continue with physical therapy per protocol.  Begin strengthening.  Recheck in 6 weeks.  May wean sling as tolerated.

## 2017-05-11 ENCOUNTER — Encounter: Payer: Self-pay | Admitting: Physical Therapy

## 2017-05-11 ENCOUNTER — Ambulatory Visit: Payer: Medicaid Other | Admitting: Physical Therapy

## 2017-05-11 DIAGNOSIS — G8929 Other chronic pain: Secondary | ICD-10-CM

## 2017-05-11 DIAGNOSIS — M6281 Muscle weakness (generalized): Secondary | ICD-10-CM

## 2017-05-11 DIAGNOSIS — M25611 Stiffness of right shoulder, not elsewhere classified: Secondary | ICD-10-CM

## 2017-05-11 DIAGNOSIS — M25511 Pain in right shoulder: Secondary | ICD-10-CM

## 2017-05-11 DIAGNOSIS — M5440 Lumbago with sciatica, unspecified side: Secondary | ICD-10-CM

## 2017-05-11 DIAGNOSIS — R293 Abnormal posture: Secondary | ICD-10-CM

## 2017-05-11 NOTE — Therapy (Signed)
[place], [place], [address] Phone: [phone]   Fax:  [phone]  Physical Therapy Treatment  Patient Details  Name: Jeffery [name] MRN: [id number] Date of Birth: [birth year] Referring Provider: Dr. Frankey Shown    Encounter Date: 05/11/2017  PT End of Session - 05/11/17 1355    Visit Number  4    Number of Visits  15    Date for PT Re-Evaluation  06/08/17    Authorization Type  Medicaid    Authorization Time Period  1/23 to 2/12    Authorization - Visit Number  3    Authorization - Number of Visits  3    PT Start Time  1331    PT Stop Time  1421    PT Time Calculation (min)  50 min    Activity Tolerance  Patient tolerated treatment well    Behavior During Therapy  [facility] for tasks assessed/performed       Past Medical History:  Diagnosis Date  . Atrial fibrillation ([place])   . Charcot's joint of right foot   . DDD (degenerative disc disease), lumbar   . Diabetes mellitus without complication ([place])   . Fatty liver   . Hypertension   . Obesity   . Rotator cuff disorder   . Shoulder impingement, right     Past Surgical History:  Procedure Laterality Date  . AMPUTATION Left 10/02/2014   Procedure: Left Third toe amputation ;  Surgeon: [name], MD;  Location: [place];  Service: Orthopedics;  Laterality: Left;  Regular bed, wants to follow hip  . ANTERIOR CRUCIATE LIGAMENT REPAIR Right 90   reconstruction  . APPLICATION OF WOUND VAC Left 10/02/2014   Procedure: APPLICATION OF WOUND VAC; toe Surgeon: [name], MD;  Location: [place];  Service: Orthopedics;  Laterality: Left;  . I&D EXTREMITY Left 10/05/2014   Procedure: IRRIGATION AND DEBRIDEMENT LEFT FOOT;  Surgeon: [name], MD;  Location: [place];  Service: Orthopedics;  Laterality: Left;  . KNEE ARTHROSCOPY W/ ACL RECONSTRUCTION Right   . TOTAL KNEE ARTHROPLASTY Right 03/28/2015  . TOTAL KNEE ARTHROPLASTY Right 03/28/2015   Procedure:  RIGHT TOTAL KNEE ARTHROPLASTY;  Surgeon: [name], MD;  Location: [place];  Service: Orthopedics;  Laterality: Right;    There were no vitals filed for this visit.  Subjective Assessment - 05/11/17 1336    Subjective  5/10 today. I helped my brother do some work this weekend.  THe MD cleared me to strengthen.  Brings in new RX for 2 x week.      Currently in Pain?  Yes    Pain Score  5     Pain Location  Shoulder    Pain Orientation  Right    Pain Descriptors / Indicators  Aching;Discomfort    Pain Type  Surgical pain    Pain Onset  More than a month ago    Pain Frequency  Intermittent    Aggravating Factors   lifting to shoulder hgt.    Pain Relieving Factors  meds, ice , rest           [facility] Adult PT Treatment/Exercise - 05/11/17 0001      Shoulder Exercises: Standing   External Rotation  AAROM;Strengthening;Both;10 reps    Flexion  AAROM;Strengthening;Both;15 reps    ABduction  AAROM;Right;10 reps    Extension  AAROM;Both;10 reps    Row  Strengthening;Both;20 reps;Theraband    Theraband Level (Shoulder  Row)  Level 2 (Red)    Other Standing Exercises  extension red x 10       Shoulder Exercises: Pulleys   Flexion  3 minutes      Shoulder Exercises: ROM/Strengthening   UBE (Upper Arm Bike)  5 min L1, 2.5 min forward and 2.5 min back     Ball on Wall  1 min x 3 shoulder hgt.        Modalities   Modalities  Cryotherapy      Cryotherapy   Number Minutes Cryotherapy  10 Minutes    Cryotherapy Location  Shoulder    Type of Cryotherapy  Ice pack      Manual Therapy   Manual Therapy  Joint mobilization;Passive ROM    Joint Mobilization  A/P gentle, inf glides Gr I-II  distraction , oscillation     Passive ROM  all planes              PT Education - 05/11/17 1351    Education provided  Yes    Education Details  strengthening, [place]     Person(s) Educated  Patient    Methods  Explanation    Comprehension  Verbalized understanding;Returned demonstration        PT Short Term Goals - 05/11/17 1514      PT SHORT TERM GOAL #1   Title  Pt will be I with initial HEP     Baseline  goal met for AAROM ,pendulum, isometric    Status  Achieved      PT SHORT TERM GOAL #2   Title  Pt will tolerate PROM to near full with min pain overall    Baseline  goal met     Status  Achieved      PT SHORT TERM GOAL #3   Title  Pt will be able to type, do puzzles, and othe fine motor skills without pain increase.     Baseline  goal met     Status  Achieved        PT Long Term Goals - 05/11/17 1515      PT LONG TERM GOAL #1   Title  Pt will understand posture, body mechanics, RICE and lifting to prevent further disability.     Baseline  needs reinforcement for upper back posture in sitting     Status  On-going      PT LONG TERM GOAL #2   Title  Pt will be I with more advanced HEP for Rt UE strength and ROM.     Baseline  unknown, I with inital on eval as much as protocol allows. Added light therabnd today.     Status  On-going      PT LONG TERM GOAL #3   Title  Pt will be able to lift 10 lbs just above shoulder height with Rt. UE for improved work and home tasks.     Baseline  unable, not permitted by MD protocol     Status  On-going      PT LONG TERM GOAL #4   Title  Pt will be able to reach behind his back and behind his head for improved ability to complete ADLs, grooming.     Baseline  improving , unable to reach back and head due to stiffness     Status  On-going      PT LONG TERM GOAL #5   Title  Pt will demo 4+/5 or more strength throughout Rt. UE for  maximal function to allow safe return to work     Baseline  3/5 to 3+/5 in flexion and abduction     Status  On-going            Plan - 05/11/17 1357    Clinical Impression Statement  Patient has been cleared for strengthening and did well today with theraband. Tension Rt. anteriorly with shoulder extension.  Needs cues to move slowly and control ROM, maintaining.  Able to lift arm  to 145 deg but fatigues quikckly with repetition.     PT Frequency  2x / week    PT Duration  6 weeks    PT Treatment/Interventions  ADLs/Self Care Home Management;Cryotherapy;Ultrasound;Moist Heat;Electrical Stimulation;Balance training;Therapeutic exercise;Therapeutic activities;Functional mobility training;Neuromuscular re-education;Patient/family education;Manual techniques;Taping;Passive range of motion;Vasopneumatic Device    PT Next Visit Plan  check HEP gentle strengthening , modalites as needed, try ultrasound ant shoulder for inflammation.     PT Home Exercise Plan  AAROM cane ex, pendulums  ,DC  isometrics , row and extension , red ERIR     Consulted and Agree with Plan of Care  Patient       Patient will benefit from skilled therapeutic intervention in order to improve the following deficits and impairments:  Decreased mobility, Hypomobility, Obesity, Improper body mechanics, Pain, Postural dysfunction, Impaired UE functional use, Impaired flexibility, Increased fascial restricitons, Decreased strength, Decreased range of motion  Visit Diagnosis: Muscle weakness (generalized)  Stiffness of right shoulder, not elsewhere classified  Acute pain of right shoulder  Abnormal posture  Chronic right-sided low back pain with sciatica, sciatica laterality unspecified     Problem List Patient Active Problem List   Diagnosis Date Noted  . S/P right rotator cuff repair 04/05/2017  . Diabetes mellitus without complication ([place]) 47/11/6281  . Achilles tendon contracture, right 01/05/2017  . Closed nondisplaced fracture of distal phalanx of right great toe 01/05/2017  . Paroxysmal atrial fibrillation ([place]) 10/30/2016  . Pain in right ankle and joints of right foot 07/09/2016  . Diabetic polyneuropathy associated with type 2 diabetes mellitus ([place]) 07/09/2016  . 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 Impingement syndrome of  right shoulder 07/07/2016  . Pain in right foot 07/07/2016  . Charcot's joint of right foot 07/03/2016  . Morbid obesity ([place]) 06/05/2016  . S/P total knee replacement using cement 03/28/2015  . Knee osteoarthritis 11/12/2014  . Hypertension 10/15/2014  . Osteomyelitis of toe of left foot ([place])   . Toe osteomyelitis, left ([place]) 10/01/2014  . Charcot foot due to diabetes mellitus ([place]) 10/01/2014  . Accelerated hypertension 10/01/2014  . Gangrene left third toe 10/01/2014  . SHOULDER JOINT INSTABILITY 05/27/2010  . SHOULDER PAIN, RIGHT 05/27/2010    Jeffery [name] 05/11/2017, 3:22 PM  [facility] [address] [place], [place], [address] Phone: [phone]   Fax:  [phone]  Name: Jeffery [name] MRN: [id number] Date of Birth: 24-Oct-1960  Raeford Razor, PT 05/11/17 3:22 PM Phone: [phone] Fax: [phone]

## 2017-05-11 NOTE — Patient Instructions (Signed)
Strengthening: Resisted Internal Rotation   Hold tubing in left hand, elbow at side and forearm out. Rotate forearm in across body. Repeat __10__ times per set. Do _2___ sets per session. Do __2__ sessions per day.  http://orth.exer.us/830   Copyright  VHI. All rights reserved.  Strengthening: Resisted External Rotation   Hold tubing in right hand, elbow at side and forearm across body. Rotate forearm out. Repeat ___10_ times per set. Do __2__ sets per session. Do __2__ sessions per day.  http://orth.exer.us/828   Copyright  VHI. All rights reserved.  Strengthening: Resisted Flexion   Hold tubing with left arm at side. Pull forward and up. Move shoulder through pain-free range of motion. Repeat __10__ times per set. Do ___2_ sets per session. Do __2__ sessions per day.  http://orth.exer.us/824   Copyright  VHI. All rights reserved.  Strengthening: Resisted Extension   Hold tubing in right hand, arm forward. Pull arm back, elbow straight. Repeat ___10_ times per set. Do _2___ sets per session. Do ___2_ sessions per day.  http://orth.exer.us/832   Copyright  VHI. All rights reserved.

## 2017-05-13 ENCOUNTER — Telehealth (INDEPENDENT_AMBULATORY_CARE_PROVIDER_SITE_OTHER): Payer: Self-pay | Admitting: Orthopaedic Surgery

## 2017-05-13 NOTE — Telephone Encounter (Signed)
Paper work placed up front in envelope. Patient is aware forms are ready.

## 2017-05-13 NOTE — Telephone Encounter (Signed)
Patient called advised he left two papers yesterday for Dr Erlinda Hong to complete. The paper was for [facility]. Patient advised he is to take the papers with him tomorrow  To his appointment. The number to contact patient is [phone]

## 2017-05-14 DIAGNOSIS — I10 Essential (primary) hypertension: Secondary | ICD-10-CM

## 2017-05-18 ENCOUNTER — Telehealth (INDEPENDENT_AMBULATORY_CARE_PROVIDER_SITE_OTHER): Payer: Self-pay

## 2017-05-18 NOTE — Telephone Encounter (Signed)
Called [place] no answer LMOM to return call regarding the pain clinic paperwork he left

## 2017-06-09 ENCOUNTER — Encounter: Payer: Self-pay | Admitting: Physical Therapy

## 2017-06-09 ENCOUNTER — Ambulatory Visit: Payer: Medicaid Other | Attending: Orthopaedic Surgery | Admitting: Physical Therapy

## 2017-06-09 DIAGNOSIS — G8929 Other chronic pain: Secondary | ICD-10-CM | POA: Diagnosis present

## 2017-06-09 DIAGNOSIS — M5440 Lumbago with sciatica, unspecified side: Secondary | ICD-10-CM | POA: Diagnosis present

## 2017-06-09 DIAGNOSIS — M6281 Muscle weakness (generalized): Secondary | ICD-10-CM | POA: Insufficient documentation

## 2017-06-09 DIAGNOSIS — R293 Abnormal posture: Secondary | ICD-10-CM | POA: Diagnosis present

## 2017-06-09 DIAGNOSIS — M25611 Stiffness of right shoulder, not elsewhere classified: Secondary | ICD-10-CM

## 2017-06-09 DIAGNOSIS — M25511 Pain in right shoulder: Secondary | ICD-10-CM | POA: Diagnosis present

## 2017-06-09 NOTE — Therapy (Signed)
[name], [place], [address] Phone: [phone]   Fax:  [phone]  Physical Therapy Treatment  Patient Details  Name: Jeffery [name] MRN: [id number] Date of Birth: [birth year] Referring Provider: Dr. Frankey Shown    Encounter Date: 06/09/2017  PT End of Session - 06/09/17 1508    Visit Number  5    Number of Visits  15    Date for PT Re-Evaluation  07/21/17    Authorization Type  Medicaid    Authorization Time Period  2/18-3/31    Authorization - Visit Number  1    Authorization - Number of Visits  12    PT Start Time  8144    PT Stop Time  1505    PT Time Calculation (min)  50 min    Activity Tolerance  Patient tolerated treatment well    Behavior During Therapy  [facility] for tasks assessed/performed       Past Medical History:  Diagnosis Date  . Atrial fibrillation ([place])   . Charcot's joint of right foot   . DDD (degenerative disc disease), lumbar   . Diabetes mellitus without complication ([place])   . Fatty liver   . Hypertension   . Obesity   . Rotator cuff disorder   . Shoulder impingement, right     Past Surgical History:  Procedure Laterality Date  . AMPUTATION Left 10/02/2014   Procedure: Left Third toe amputation ;  Surgeon: [name], MD;  Location: [place];  Service: Orthopedics;  Laterality: Left;  Regular bed, wants to follow hip  . ANTERIOR CRUCIATE LIGAMENT REPAIR Right 90   reconstruction  . APPLICATION OF WOUND VAC Left 10/02/2014   Procedure: APPLICATION OF WOUND VAC; toe Surgeon: [name], MD;  Location: [place];  Service: Orthopedics;  Laterality: Left;  . I&D EXTREMITY Left 10/05/2014   Procedure: IRRIGATION AND DEBRIDEMENT LEFT FOOT;  Surgeon: [name], MD;  Location: [place];  Service: Orthopedics;  Laterality: Left;  . KNEE ARTHROSCOPY W/ ACL RECONSTRUCTION Right   . TOTAL KNEE ARTHROPLASTY Right 03/28/2015  . TOTAL KNEE ARTHROPLASTY Right 03/28/2015   Procedure: RIGHT  TOTAL KNEE ARTHROPLASTY;  Surgeon: [name], MD;  Location: [place];  Service: Orthopedics;  Laterality: Right;    There were no vitals filed for this visit.  Subjective Assessment - 06/09/17 1421    Pain Location  Shoulder    Pain Orientation  Right    Pain Type  Surgical pain    Pain Onset  More than a month ago    Pain Frequency  Intermittent    Aggravating Factors   pulling up pants, overactivity     Pain Relieving Factors  meds, rest ice          [facility] PT Assessment - 06/09/17 0001      AROM   Right Shoulder Flexion  150 Degrees    Right Shoulder ABduction  120 Degrees    Right Shoulder Internal Rotation  -- FR to L1    Right Shoulder External Rotation  -- FR to T2       Strength   Right Shoulder Flexion  3+/5    Right Shoulder ABduction  3+/5    Right Shoulder Internal Rotation  5/5    Right Shoulder External Rotation  4/5                  [facility] Adult PT Treatment/Exercise - 06/09/17 0001  Shoulder Exercises: Standing   External Rotation  Strengthening;Right;20 reps;Theraband    Theraband Level (Shoulder External Rotation)  Level 3 (Green)    Internal Rotation  Strengthening;Right;20 reps;Theraband    Flexion  Strengthening;Right;10 reps    ABduction  Strengthening;Right;10 reps    Extension  Strengthening;Both;20 reps;Theraband    Theraband Level (Shoulder Extension)  Level 3 (Green)    Row  Strengthening;Both;20 reps;Theraband    Theraband Level (Shoulder Row)  Level 3 (Green)    Other Standing Exercises  standing punch red band x 20       Shoulder Exercises: ROM/Strengthening   UBE (Upper Arm Bike)  6 min level 1 , 3 min FW and 3 min Back      Cryotherapy   Number Minutes Cryotherapy  10 Minutes    Cryotherapy Location  Shoulder    Type of Cryotherapy  Ice pack      Manual Therapy   Joint Mobilization  -- distraction , oscillation     Passive ROM  all planes              PT Education - 06/09/17 1508    Education provided   Yes    Education Details  continued PT, HEP, stretching     Person(s) Educated  Patient    Methods  Explanation;Demonstration;Verbal cues;Handout    Comprehension  Verbalized understanding;Returned demonstration       PT Short Term Goals - 06/09/17 1509      PT SHORT TERM GOAL #1   Title  Pt will be I with initial HEP     Status  Achieved      PT SHORT TERM GOAL #2   Title  Pt will tolerate PROM to near full with min pain overall    Status  Achieved      PT SHORT TERM GOAL #3   Title  Pt will be able to type, do puzzles, and othe fine motor skills without pain increase.     Status  Achieved        PT Long Term Goals - 06/09/17 1509      PT LONG TERM GOAL #1   Title  Pt will understand posture, body mechanics, RICE and lifting to prevent further disability.     Status  On-going      PT LONG TERM GOAL #2   Title  Pt will be I with more advanced HEP for Rt UE strength and ROM.     Status  On-going      PT LONG TERM GOAL #3   Title  Pt will be able to lift 10 lbs just above shoulder height with Rt. UE for improved work and home tasks.     Status  On-going      PT LONG TERM GOAL #4   Title  Pt will be able to reach behind his back and behind his head for improved ability to complete ADLs, grooming.     Baseline  reaches to Rt. L1 in IR and T2 for ER     Status  On-going      PT LONG TERM GOAL #5   Title  Pt will demo 4+/5 or more strength throughout Rt. UE for maximal function to allow safe return to work     Baseline  3+/5 flexion and abduction     Status  On-going            Plan - 06/09/17 1510    Clinical Impression Statement  Pt has  not been seen in 1 month due to sickness, scheduling.  He continues ot improve.  He has pain only with lifting, end range abduction and reaching back to lower body dress.  He will cont 2 x 6 more weeks , tapering down to 1 x if preferred.     PT Frequency  2x / week    PT Duration  6 weeks    PT Treatment/Interventions   ADLs/Self Care Home Management;Cryotherapy;Ultrasound;Moist Heat;Electrical Stimulation;Balance training;Therapeutic exercise;Therapeutic activities;Functional mobility training;Neuromuscular re-education;Patient/family education;Manual techniques;Taping;Passive range of motion;Vasopneumatic Device    PT Next Visit Plan  progress bands, end range ROM, functional reaching, scapualr stab. , modalities for pain     PT Home Exercise Plan  AAROM cane ex, pendulums  ,DC  isometrics , row and extension , red flex, abd and green for ER     Consulted and Agree with Plan of Care  Patient       Patient will benefit from skilled therapeutic intervention in order to improve the following deficits and impairments:  Decreased mobility, Hypomobility, Obesity, Improper body mechanics, Pain, Postural dysfunction, Impaired UE functional use, Impaired flexibility, Increased fascial restricitons, Decreased strength, Decreased range of motion  Visit Diagnosis: Muscle weakness (generalized)  Stiffness of right shoulder, not elsewhere classified  Acute pain of right shoulder  Abnormal posture  Chronic right-sided low back pain with sciatica, sciatica laterality unspecified     Problem List Patient Active Problem List   Diagnosis Date Noted  . S/P right rotator cuff repair 04/05/2017  . Diabetes mellitus without complication ([place]) 09/32/6712  . Achilles tendon contracture, right 01/05/2017  . Closed nondisplaced fracture of distal phalanx of right great toe 01/05/2017  . Paroxysmal atrial fibrillation ([place]) 10/30/2016  . Pain in right ankle and joints of right foot 07/09/2016  . Diabetic polyneuropathy associated with type 2 diabetes mellitus ([place]) 07/09/2016  . 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 Impingement syndrome of right shoulder 07/07/2016  . Pain in right foot 07/07/2016  . Charcot's joint of right foot 07/03/2016  . Morbid obesity ([place])  06/05/2016  . S/P total knee replacement using cement 03/28/2015  . Knee osteoarthritis 11/12/2014  . Hypertension 10/15/2014  . Osteomyelitis of toe of left foot ([place])   . Toe osteomyelitis, left ([place]) 10/01/2014  . Charcot foot due to diabetes mellitus ([place]) 10/01/2014  . Accelerated hypertension 10/01/2014  . Gangrene left third toe 10/01/2014  . SHOULDER JOINT INSTABILITY 05/27/2010  . SHOULDER PAIN, RIGHT 05/27/2010    Reygan Heagle 06/09/2017, 3:13 PM  [facility] [address] [place], [place], [address] Phone: [phone]   Fax:  [phone]  Name: JALANI ROMINGER MRN: [id number] Date of Birth: May 14, 1960  Raeford Razor, PT 06/09/17 3:13 PM Phone: [phone] Fax: [phone]

## 2017-06-10 ENCOUNTER — Encounter: Payer: Self-pay | Admitting: Cardiovascular Disease

## 2017-06-10 ENCOUNTER — Ambulatory Visit (INDEPENDENT_AMBULATORY_CARE_PROVIDER_SITE_OTHER): Payer: Medicaid Other | Admitting: Cardiovascular Disease

## 2017-06-10 VITALS — BP 128/84 | HR 88 | Ht 75.0 in | Wt 316.4 lb

## 2017-06-10 DIAGNOSIS — E11610 Type 2 diabetes mellitus with diabetic neuropathic arthropathy: Secondary | ICD-10-CM | POA: Diagnosis not present

## 2017-06-10 DIAGNOSIS — E785 Hyperlipidemia, unspecified: Secondary | ICD-10-CM

## 2017-06-10 DIAGNOSIS — Z7901 Long term (current) use of anticoagulants: Secondary | ICD-10-CM | POA: Diagnosis not present

## 2017-06-10 DIAGNOSIS — Z79899 Other long term (current) drug therapy: Secondary | ICD-10-CM

## 2017-06-10 DIAGNOSIS — I10 Essential (primary) hypertension: Secondary | ICD-10-CM | POA: Diagnosis not present

## 2017-06-10 DIAGNOSIS — I480 Paroxysmal atrial fibrillation: Secondary | ICD-10-CM | POA: Diagnosis not present

## 2017-06-10 MED ORDER — METOPROLOL TARTRATE 100 MG PO TABS: 100.0000 mg | ORAL_TABLET | Freq: Two times a day (BID) | ORAL | 3 refills | Status: DC

## 2017-06-10 NOTE — Patient Instructions (Signed)
Medication Instructions:  INCREASE metoprolol tartrate (Lopressor) to 100 mg two times daily  Labwork: Please return for FASTING labs (CMET, Lipid)  Our in office lab hours are Monday-Friday 8:00-4:30, closed for lunch 1-2 pm.  No appointment needed.  Testing/Procedures: Your physician has recommended that you have a sleep study. This test records several body functions during sleep, including: brain activity, eye movement, oxygen and carbon dioxide blood levels, heart rate and rhythm, breathing rate and rhythm, the flow of air through your mouth and nose, snoring, body muscle movements, and chest and belly movement.  Follow-Up: Your physician wants you to follow-up in: 4 months with Dr. Claiborne Billings. You will receive a reminder letter in the mail two months in advance. If you don't receive a letter, please call our office to schedule the follow-up appointment.   Any Other Special Instructions Will Be Listed Below (If Applicable).     If you need a refill on your cardiac medications before your next appointment, please call your pharmacy.

## 2017-06-10 NOTE — Progress Notes (Signed)
Cardiology Office Note    Date:  06/12/2017   ID:  Jeffery [name], DOB 08/17/1960, MRN [id number]  PCP:  [name], MD  Cardiologist:  Shelva Majestic, MD   No chief complaint on file.   History of Present Illness:  Jeffery [name] is a 57 y.o. male who is the son of my former patient Jeffery [name].    He established cardiology care with me in December 2018.  He presents for three-month follow-up evaluation.  Jeffery [name] has a history of obesity, diabetes mellitus, and hypertension.  He previously had a significant EtOH history and was drinking up to 4 cases of beer per week.  In May after having significant amount of beer the night before he woke up in the morning feeling weak.  He presented and was found to be in atrial fibrillation of new onset.  He was placed on Lopressor and eliquis and underwent successful cardioversion.  His cha2ds2vasc score was 2.  He was seen in A. fib clinic in follow-up on 08/27/2016 and was in sinus rhythm with PACs.  An echo Doppler study showed an EF of 50-55% with grade 1 diastolic dysfunction.  His left atrium was moderately dilated.  There was trivial MR.  He had done well maintaining sinus rhythm until August when again he had another alcohol binge and was back in atrial fibrillation.  He had been having difficulty with his back and had been off eliquis a week earlier to allow for treatment with ibuprofen.  He again was cardioverted for his A. fib with a rate of 169 on presentation and eliquis was resumed.  The patient has not had any alcohol since that presentation and quit smoking.    I saw him in December 2018 and he was maintaining sinus rhythm. He was having significant difficulty with his back. He has a Charcot foot and has difficulty walking. He denied any chest tightness or chest pressure.  He was unable to exercise due to his back discomfort. He had run out of his metoprolol as pulses in the 90s with PACs.. I further titrated metoprolol to  75 mg twice a day, and he has continued to be on losartan 50 mg daily for HTN.    Over the last several months, he has noticed occasional palpitations on his increase metoprolol 75 twice a day regimen.  He has not been able to exercise due to his back and foot.  He has not had any alcohol since August 2018. He continues to be on eliquis for anticoagulation. He snores, gets up several times per night,and at times his sleep is nonrestorative.  He presents for reevaluation.   Past Medical History:  Diagnosis Date  . Atrial fibrillation ([place])   . Charcot's joint of right foot   . DDD (degenerative disc disease), lumbar   . Diabetes mellitus without complication ([place])   . Fatty liver   . Hypertension   . Obesity   . Rotator cuff disorder   . Shoulder impingement, right     Past Surgical History:  Procedure Laterality Date  . AMPUTATION Left 10/02/2014   Procedure: Left Third toe amputation ;  Surgeon: [name], MD;  Location: [facility];  Service: Orthopedics;  Laterality: Left;  Regular bed, wants to follow hip  . ANTERIOR CRUCIATE LIGAMENT REPAIR Right 90   reconstruction  . APPLICATION OF WOUND VAC Left 10/02/2014   Procedure: APPLICATION OF WOUND VAC; toe Surgeon: [name], MD;  [facility]  OR;  Service: Orthopedics;  Laterality: Left;  . I&D EXTREMITY Left 10/05/2014   Procedure: IRRIGATION AND DEBRIDEMENT LEFT FOOT;  Surgeon: [name], MD;  Location: [facility];  Service: Orthopedics;  Laterality: Left;  . KNEE ARTHROSCOPY W/ ACL RECONSTRUCTION Right   . TOTAL KNEE ARTHROPLASTY Right 03/28/2015  . TOTAL KNEE ARTHROPLASTY Right 03/28/2015   Procedure: RIGHT TOTAL KNEE ARTHROPLASTY;  Surgeon: [name], MD;  Location: [facility];  Service: Orthopedics;  Laterality: Right;    Current Medications: Outpatient Medications Prior to Visit  Medication Sig Dispense Refill  . apixaban (ELIQUIS) 5 MG TABS tablet Take 1 tablet (5 mg total) by mouth 2 (two) times daily. 180 tablet 3  .  atorvastatin (LIPITOR) 20 MG tablet Take 1 tablet (20 mg total) by mouth daily. 30 tablet 2  . Blood Glucose Monitoring Suppl (TRUE METRIX METER) DEVI 1 each by Does not apply route 3 (three) times daily before meals. 1 Device 0  . exenatide (BYETTA 10 MCG PEN) 10 MCG/0.04ML SOPN injection Inject 0.04 mLs (10 mcg total) 2 (two) times daily with a meal into the skin. 1 pen 5  . gabapentin (NEURONTIN) 300 MG capsule Take 2 capsules (600 mg total) at bedtime by mouth. When necessary for neuropathy pain 60 capsule 3  . glucose blood (TRUE METRIX BLOOD GLUCOSE TEST) test strip Use 3 times daily before meals 100 each 12  . Insulin Glargine (LANTUS SOLOSTAR) 100 UNIT/ML Solostar Pen INJECT 34 UNITS INTO SKIN IN THE MORNING AND AT BEDTIME 15 mL 3  . Insulin Pen Needle (B-D ULTRAFINE III SHORT PEN) 31G X 8 MM MISC 1 each by Does not apply route 3 (three) times daily. 100 each 5  . Insulin Syringe-Needle U-100 (TRUEPLUS INSULIN SYRINGE) 30G X 5/16" 0.5 ML MISC Use as directed 3 times daily 100 each 5  . lidocaine (LIDODERM) 5 % Place 1 patch daily onto the skin. Remove & Discard patch within 12 hours or as directed by MD 30 patch 3  . losartan (COZAAR) 50 MG tablet Take 1 tablet (50 mg total) by mouth daily. 30 tablet 5  . oxyCODONE-acetaminophen (PERCOCET) 5-325 MG tablet 1-2 tabs po bid prn pain 30 tablet 0  . oxyCODONE-acetaminophen (PERCOCET/ROXICET) 5-325 MG tablet Take 1 tablet by mouth every 4 (four) hours as needed for moderate pain. Takes one tablet in the morning, 1 tablet at noon, and 1 tablet at night.     . sodium chloride (OCEAN) 0.65 % SOLN nasal spray Place 1 spray into both nostrils 2 (two) times daily as needed for congestion.    Marland Kitchen tiZANidine (ZANAFLEX) 4 MG tablet Take 1 tablet (4 mg total) by mouth every 6 (six) hours as needed for muscle spasms. 30 tablet 2  . TRUEPLUS INSULIN SYRINGE 30G X 5/16" 0.5 ML MISC USE AS DIRECTED 3 TIMES DAILY 100 each 0  . TRUEPLUS LANCETS 28G MISC 1 each by  Does not apply route 3 (three) times daily before meals. 100 each 12  . metoprolol tartrate (LOPRESSOR) 50 MG tablet Take 1.5 tablets (75 mg total) by mouth 2 (two) times daily. 135 tablet 3  . ondansetron (ZOFRAN) 4 MG tablet Take 1-2 tablets (4-8 mg total) by mouth every 8 (eight) hours as needed for nausea or vomiting. (Patient not taking: Reported on 06/10/2017) 40 tablet 0  . oxyCODONE (OXYCONTIN) 10 mg 12 hr tablet Take 1 tablet (10 mg total) by mouth every 12 (twelve) hours. (Patient not taking: Reported on 06/10/2017)  10 tablet 0  . promethazine (PHENERGAN) 25 MG tablet Take 1 tablet (25 mg total) by mouth every 6 (six) hours as needed for nausea. (Patient not taking: Reported on 06/10/2017) 30 tablet 1  . senna-docusate (SENOKOT S) 8.6-50 MG tablet Take 1 tablet by mouth at bedtime as needed. (Patient not taking: Reported on 06/10/2017) 30 tablet 1  . traMADol (ULTRAM) 50 MG tablet Take 1-2 tablets tid prn severe pain (Patient not taking: Reported on 06/10/2017) 30 tablet 0   No facility-administered medications prior to visit.      Allergies:   Patient has no known allergies.   Social History   Socioeconomic History  . Marital status: Single    Spouse name: None  . Number of children: None  . Years of education: None  . Highest education level: None  Social Needs  . Financial resource strain: None  . Food insecurity - worry: None  . Food insecurity - inability: None  . Transportation needs - medical: None  . Transportation needs - non-medical: None  Occupational History  . Occupation: Management consultant  Tobacco Use  . Smoking status: Former Smoker    Packs/day: 0.00    Years: 38.00    Pack years: 0.00    Types: Cigarettes  . Smokeless tobacco: Never Used  Substance and Sexual Activity  . Alcohol use: No    Alcohol/week: 3.0 - 3.6 oz    Types: 5 - 6 Cans of beer per week  . Drug use: Yes    Types: Marijuana    Comment: last week ; denies 03/24/17  . Sexual  activity: None  Other Topics Concern  . None  Social History Narrative   Lives alone.    Socially, never married.  He has no children.  He is currently living with his mother.  He is not working.  Previously had done jobs.  Family History:  The patient's family history includes Diabetes in his father; Heart failure in his father; Hypertension in his father.  His father died at age 2.  Mother is still living at age 3.  ROS General: Negative; No fevers, chills, or night sweats;  HEENT: Negative; No changes in vision or hearing, sinus congestion, difficulty swallowing Pulmonary: Negative; No cough, wheezing, shortness of breath, hemoptysis Cardiovascular: Negative; No chest pain, presyncope, syncope, palpitations GI: Negative; No nausea, vomiting, diarrhea, or abdominal pain GU: Negative; No dysuria, hematuria, or difficulty voiding Musculoskeletal: Negative; no myalgias, joint pain, or weakness Hematologic/Oncology: Negative; no easy bruising, bleeding Endocrine: Negative; no heat/cold intolerance; no diabetes Neuro: Negative; no changes in balance, headaches Skin: Negative; No rashes or skin lesions Psychiatric: Negative; No behavioral problems, depression Sleep: Positive for snoring, daytime sleepiness, hypersomnolence, bruxism, restless legs, hypnogognic hallucinations, no cataplexy Other comprehensive 14 point system review is negative.   PHYSICAL EXAM:   BP 128/84   Pulse 88   Ht _0  (1.905 m)   Wt (!) 316 lb 6.4 oz (143.5 kg)   BMI 39.55 kg/m   Repeat blood pressure by me was 138/86  Wt Readings from Last 3 Encounters:  06/10/17 (!) 316 lb 6.4 oz (143.5 kg)  03/26/17 (!) 312 lb (141.5 kg)  03/04/17 (!) 316 lb (143.3 kg)    General: Alert, oriented, no distress. obese Skin: normal turgor, no rashes, warm and dry HEENT: Normocephalic, atraumatic. Pupils equal round and reactive to light; sclera anicteric; extraocular muscles intact;  Nose without nasal septal  hypertrophy Mouth/Parynx benign; Mallinpatti scale 3 Neck: No  JVD, no carotid bruits; normal carotid upstroke Lungs: clear to ausculatation and percussion; no wheezing or rales Chest wall: without tenderness to palpitation Heart: PMI not displaced, RRR, s1 s2 normal, 1/6 systolic murmur, no diastolic murmur, no rubs, gallops, thrills, or heaves Abdomen: Moderate central adiposity; soft, nontender; no hepatosplenomehaly, BS+; abdominal aorta nontender and not dilated by palpation. Back: no CVA tenderness Pulses 2+ Musculoskeletal: full range of motion, normal strength, no joint deformities Extremities: no clubbing cyanosis or edema, Homan's sign negative  Neurologic: grossly nonfocal; Cranial nerves grossly wnl Psychologic: Normal mood and affect    Studies/Labs Reviewed:   EKG:  EKG is ordered today. ECG (independently read by me): normal sinus rhythm at 88 bpm. QTc interval 433 ms.  December 2018 ECG (independently read by me): Sinus rhythm in the 90s with PACs.  QTc interval 442 ms.  Recent Labs: BMP Latest Ref Rng & Units 04/15/2017 03/26/2017 11/04/2016  Glucose mg/dL CANCELED 107(H) 152(H)  BUN - CANCELED 12 12  Creatinine - CANCELED 0.73 0.80  BUN/Creat Ratio 9 - 20 - - 15  Sodium - CANCELED 137 140  Potassium - CANCELED 4.7 5.1  Chloride - CANCELED 104 103  CO2 - CANCELED 23 22  Calcium - CANCELED 9.0 9.6     Hepatic Function 04/15/2017 03/26/2017 11/04/2016  Total Protein CANCELED 7.1 7.2  Albumin CANCELED 3.8 4.0  AST CANCELED 28 15  ALT CANCELED 23 22  Alk Phosphatase CANCELED 73 79  Total Bilirubin CANCELED 0.8 0.4    CBC Latest Ref Rng & Units 04/15/2017 03/26/2017 11/01/2016  WBC 3.4 - 10.8 x10E3/uL 9.8 9.3 11.2(H)  Hemoglobin 13.0 - 17.7 g/dL 14.7 14.6 15.6  Hematocrit 37.5 - 51.0 % 42.9 44.5 45.6  Platelets 150 - 379 x10E3/uL 221 203 250   Lab Results  Component Value Date   MCV 84 04/15/2017   MCV 86.4 03/26/2017   MCV 83.5 11/01/2016   Lab Results    Component Value Date   TSH 2.140 04/15/2017   Lab Results  Component Value Date   HGBA1C 8.0 (H) 02/23/2017     BNP No results found for: BNP  ProBNP No results found for: PROBNP   Lipid Panel     Component Value Date/Time   CHOL CANCELED 04/15/2017 1022   TRIG CANCELED 04/15/2017 1022   HDL CANCELED 04/15/2017 1022   CHOLHDL 4.5 11/04/2016 0833   CHOLHDL 3.8 04/10/2010 0315   VLDL 32 04/10/2010 0315   LDLCALC 108 (H) 11/04/2016 2993     RADIOLOGY: No results found.   Additional studies/ records that were reviewed today include:  I reviewed the patient's atrial fibrillation clinic records as well as his ER visits.  ------------------------------------------------------------------- 08/18/2016 ECHO Study Conclusions  - Left ventricle: The cavity size was mildly dilated. Wall   thickness was increased in a pattern of mild LVH. Systolic   function was normal. The estimated ejection fraction was in the   range of 50% to 55%. Wall motion was normal; there were no   regional wall motion abnormalities. Doppler parameters are   consistent with abnormal left ventricular relaxation (grade 1   diastolic dysfunction). - Aortic valve: Transvalvular velocity was within the normal range.   There was no stenosis. There was no regurgitation. - Mitral valve: Transvalvular velocity was within the normal range.   There was no evidence for stenosis. There was trivial   regurgitation. - Left atrium: The atrium was moderately dilated. - Right ventricle: The cavity size was normal. Wall  thickness was   normal. Systolic function was normal. - Tricuspid valve: There was no regurgitation.    ASSESSMENT:    1. Paroxysmal atrial fibrillation (HCC)   2. Hyperlipidemia with target LDL less than 70   3. Medication management   4. Essential hypertension   5. Morbid obesity ([place])   6. Anticoagulation adequate   7. Charcot foot due to diabetes mellitus [facility])      PLAN:  Mr.  Mamie [name] is a 57 year old gentleman who has a long-standing history of obesity, and has been on diabetic medication since 2016.  In addition, has a history of hypertension and hyperlipidemia.  He previously had significant EtOH history and his 2 episodes of atrial fibrillation in 2018 occurred after significant alcohol binging.  He is not had any alcohol since his last cardioversion in August.  He is morbidly obese diabetic male with a BMI of 39.55.   His echo Doppler study has shown an EF of 50-55% with grade 1 diastolic dysfunction and moderate left atrial dilatation.  His cha2ds2Vasc score is 2 by virtue of hypertension and diabetes mellitus.  Laboratory in 11/04/2016 showed a total cholesterol 191, HDL 42, LDL 108, and triglycerides 205.  At that time, he was started on atorvastatin 20 mg and although he was ordered for follow-up laboratory, apparently the lipid and chemistry profiles were not done.  His blood pressure today is mildly elevated with a target less than 130/80.  With history of intermittent palpitations I further titrating metoprolol to 100 mg twice a day.  He is walking due to Charcot foot.  He is obese.  We discussed the importance of weight loss and attempted exercise.  We discussed a minimum of 5 days a week for at least 30 minutes.  I am also concerned that he may very well have obstructive sleep apnea.  He will be scheduled for a sleep study for more definitive evaluation.  He continues to be on insulin for his diabetes mellitus.  He is tolerating atorvastatin and target LDL is less than 70.  Lipid panel  And CMET will be obtained. Recent TSH was normal at 2.14.  I will see him back in the office in 4 months for reevaluation  Medication Adjustments/Labs and Tests Ordered: Current medicines are reviewed at length with the patient today.  Concerns regarding medicines are outlined above.  Medication changes, Labs and Tests ordered today are listed in the Patient Instructions  below. Patient Instructions  Medication Instructions:  INCREASE metoprolol tartrate (Lopressor) to 100 mg two times daily  Labwork: Please return for FASTING labs (CMET, Lipid)  Our in office lab hours are Monday-Friday 8:00-4:30, closed for lunch 1-2 pm.  No appointment needed.  Testing/Procedures: Your physician has recommended that you have a sleep study. This test records several body functions during sleep, including: brain activity, eye movement, oxygen and carbon dioxide blood levels, heart rate and rhythm, breathing rate and rhythm, the flow of air through your mouth and nose, snoring, body muscle movements, and chest and belly movement.  Follow-Up: Your physician wants you to follow-up in: 4 months with Dr. Claiborne Billings. You will receive a reminder letter in the mail two months in advance. If you don't receive a letter, please call our office to schedule the follow-up appointment.   Any Other Special Instructions Will Be Listed Below (If Applicable).     If you need a refill on your cardiac medications before your next appointment, please call your pharmacy.  Signed, Shelva Majestic, MD  06/12/2017 8:36 AM    [facility] [address], [place], [place], [place]  [address] Phone: [phone]

## 2017-06-11 ENCOUNTER — Telehealth: Payer: Self-pay | Admitting: *Deleted

## 2017-06-11 ENCOUNTER — Other Ambulatory Visit: Payer: Self-pay | Admitting: Cardiovascular Disease

## 2017-06-11 DIAGNOSIS — I480 Paroxysmal atrial fibrillation: Secondary | ICD-10-CM

## 2017-06-11 DIAGNOSIS — R0683 Snoring: Secondary | ICD-10-CM

## 2017-06-11 DIAGNOSIS — I10 Essential (primary) hypertension: Secondary | ICD-10-CM

## 2017-06-11 NOTE — Telephone Encounter (Signed)
Called and left message with contact information for patient to call [facility] to reschedule his sleep study based on his schedule.

## 2017-06-12 ENCOUNTER — Encounter: Payer: Self-pay | Admitting: Cardiovascular Disease

## 2017-06-14 ENCOUNTER — Encounter: Payer: Self-pay | Admitting: Physical Therapy

## 2017-06-14 ENCOUNTER — Ambulatory Visit: Payer: Medicaid Other | Admitting: Physical Therapy

## 2017-06-14 DIAGNOSIS — R293 Abnormal posture: Secondary | ICD-10-CM

## 2017-06-14 DIAGNOSIS — M25511 Pain in right shoulder: Secondary | ICD-10-CM

## 2017-06-14 DIAGNOSIS — M6281 Muscle weakness (generalized): Secondary | ICD-10-CM | POA: Diagnosis not present

## 2017-06-14 DIAGNOSIS — M25611 Stiffness of right shoulder, not elsewhere classified: Secondary | ICD-10-CM

## 2017-06-14 DIAGNOSIS — G8929 Other chronic pain: Secondary | ICD-10-CM

## 2017-06-14 DIAGNOSIS — M5440 Lumbago with sciatica, unspecified side: Secondary | ICD-10-CM

## 2017-06-14 NOTE — Therapy (Signed)
[place], [place], [address] Phone: [phone]   Fax:  [phone]  Physical Therapy Treatment  Patient Details  Name: Jeffery [name] MRN: [id number] Date of Birth: 08-06-1960 Referring Provider: Dr. Frankey Shown    Encounter Date: 06/14/2017  PT End of Session - 06/14/17 1750    Visit Number  6    Number of Visits  15    Date for PT Re-Evaluation  07/21/17    Authorization - Visit Number  2    Authorization - Number of Visits  12    PT Start Time  3546    PT Stop Time  1630    PT Time Calculation (min)  41 min    Activity Tolerance  Patient tolerated treatment well    Behavior During Therapy  [facility] for tasks assessed/performed       Past Medical History:  Diagnosis Date  . Atrial fibrillation ([place])   . Charcot's joint of right foot   . DDD (degenerative disc disease), lumbar   . Diabetes mellitus without complication ([place])   . Fatty liver   . Hypertension   . Obesity   . Rotator cuff disorder   . Shoulder impingement, right     Past Surgical History:  Procedure Laterality Date  . AMPUTATION Left 10/02/2014   Procedure: Left Third toe amputation ;  Surgeon: [name], MD;  Location: [place];  Service: Orthopedics;  Laterality: Left;  Regular bed, wants to follow hip  . ANTERIOR CRUCIATE LIGAMENT REPAIR Right 90   reconstruction  . APPLICATION OF WOUND VAC Left 10/02/2014   Procedure: APPLICATION OF WOUND VAC; toe Surgeon: [name], MD;  Location: [place];  Service: Orthopedics;  Laterality: Left;  . I&D EXTREMITY Left 10/05/2014   Procedure: IRRIGATION AND DEBRIDEMENT LEFT FOOT;  Surgeon: [name], MD;  Location: [place];  Service: Orthopedics;  Laterality: Left;  . KNEE ARTHROSCOPY W/ ACL RECONSTRUCTION Right   . TOTAL KNEE ARTHROPLASTY Right 03/28/2015  . TOTAL KNEE ARTHROPLASTY Right 03/28/2015   Procedure: RIGHT TOTAL KNEE ARTHROPLASTY;  Surgeon: [name], MD;  Location: [place];   Service: Orthopedics;  Laterality: Right;    There were no vitals filed for this visit.  Subjective Assessment - 06/14/17 1555    Subjective   I do the exercises once in awhile.  I use arm to pull the starter on the leaf blower.  I lifted a 2 gallon sprayer back pack on my back with this arm.    Currently in Pain?  Yes    Pain Score  4     Pain Location  Shoulder    Pain Orientation  Right    Pain Descriptors / Indicators  Aching;Discomfort    Pain Type  Surgical pain    Pain Frequency  Intermittent    Aggravating Factors   overactivity    Pain Relieving Factors  ice pack,  rest    Effect of Pain on Daily Activities    reaching behind difficult    Multiple Pain Sites  -- 6-7/10 foot pain ,  long standing                      [facility] Adult PT Treatment/Exercise - 06/14/17 0001      Shoulder Exercises: Supine   Horizontal ABduction  10 reps 3 LBS also 10 X AROM    Flexion  10 reps cane    Other  Supine Exercises  triceps 3 LBS  10 X      Shoulder Exercises: Seated   Row  10 reps green band    External Rotation  15 reps red band    Internal Rotation  15 reps green band    Flexion  10 reps red band  narrow grip     Abduction  10 reps    Other Seated Exercises  Ball on wall 30 seconds 5 ways.  used mirror to decrease pain in foot with standing.       Shoulder Exercises: ROM/Strengthening   UBE (Upper Arm Bike)  6 min level 1.5, 3 min FW and 3 min Back      Manual Therapy   Joint Mobilization  self mobs with towel roll behind  inferior and add stretch with myofascial work anterior shoulder   3 minutes.  Patient gained 3 inches able to reach   L3             PT Education - 06/14/17 1750    Education provided  Yes    Education Details  stretching technique    Person(s) Educated  Patient    Methods  Explanation    Comprehension  Verbalized understanding       PT Short Term Goals - 06/09/17 1509      PT SHORT TERM GOAL #1   Title  Pt will be I with  initial HEP     Status  Achieved      PT SHORT TERM GOAL #2   Title  Pt will tolerate PROM to near full with min pain overall    Status  Achieved      PT SHORT TERM GOAL #3   Title  Pt will be able to type, do puzzles, and othe fine motor skills without pain increase.     Status  Achieved        PT Long Term Goals - 06/14/17 1755      PT LONG TERM GOAL #1   Title  Pt will understand posture, body mechanics, RICE and lifting to prevent further disability.     Baseline  Continues to need cues for sitting posture.  Understands RICE.    Lifting  at home unmonitored.  Just beginning lifting light objects in clinic.    Time  8    Period  Weeks    Status  On-going      PT LONG TERM GOAL #2   Title  Pt will be I with more advanced HEP for Rt UE strength and ROM.     Baseline  did not advance today,  he was advanced last visit.    Time  8    Period  Weeks    Status  On-going      PT LONG TERM GOAL #3   Title  Pt will be able to lift 10 lbs just above shoulder height with Rt. UE for improved work and home tasks.     Time  8    Status  Unable to assess      PT LONG TERM GOAL #4   Baseline  Able to reach L2 IR,  T3 ER    Time  8    Period  Weeks    Status  On-going      PT LONG TERM GOAL #5   Title  Pt will demo 4+/5 or more strength throughout Rt. UE for maximal function to allow safe return to work  Time  8    Period  Weeks    Status  Unable to assess            Plan - 06/14/17 1751    Clinical Impression Statement  ER 40  and 150  flexion right shoulder.  pain at end of session 2/10.  Strengthening and end range Stretching focus.  No new exercises added.      PT Next Visit Plan  encourage  HEP,  Progress strengthening.  Consider prone    PT Home Exercise Plan  AAROM cane ex, pendulums  ,DC  isometrics , row and extension , red flex, abd and green for ER     Consulted and Agree with Plan of Care  Patient       Patient will benefit from skilled therapeutic  intervention in order to improve the following deficits and impairments:  Increased muscle spasms  Visit Diagnosis: Muscle weakness (generalized)  Stiffness of right shoulder, not elsewhere classified  Acute pain of right shoulder  Abnormal posture  Chronic right-sided low back pain with sciatica, sciatica laterality unspecified     Problem List Patient Active Problem List   Diagnosis Date Noted  . S/P right rotator cuff repair 04/05/2017  . Diabetes mellitus without complication ([place]) 62/26/3335  . Achilles tendon contracture, right 01/05/2017  . Closed nondisplaced fracture of distal phalanx of right great toe 01/05/2017  . Paroxysmal atrial fibrillation ([place]) 10/30/2016  . Pain in right ankle and joints of right foot 07/09/2016  . Diabetic polyneuropathy associated with type 2 diabetes mellitus ([place]) 07/09/2016  . 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 Impingement syndrome of right shoulder 07/07/2016  . Pain in right foot 07/07/2016  . Charcot's joint of right foot 07/03/2016  . Morbid obesity ([place]) 06/05/2016  . S/P total knee replacement using cement 03/28/2015  . Knee osteoarthritis 11/12/2014  . Hypertension 10/15/2014  . Osteomyelitis of toe of left foot ([place])   . Toe osteomyelitis, left ([place]) 10/01/2014  . Charcot foot due to diabetes mellitus ([place]) 10/01/2014  . Accelerated hypertension 10/01/2014  . Gangrene left third toe 10/01/2014  . SHOULDER JOINT INSTABILITY 05/27/2010  . SHOULDER PAIN, RIGHT 05/27/2010    River Mckercher  PTA 06/14/2017, 5:59 PM  [facility] [address] [place], [place], [address] Phone: [phone]   Fax:  [phone]  Name: EJ PINSON MRN: [id number] Date of Birth: [birth year]

## 2017-06-17 ENCOUNTER — Ambulatory Visit: Payer: Medicaid Other | Admitting: Physical Therapy

## 2017-06-17 ENCOUNTER — Encounter: Payer: Self-pay | Admitting: Physical Therapy

## 2017-06-17 ENCOUNTER — Other Ambulatory Visit: Payer: Self-pay | Admitting: Family Medicine

## 2017-06-17 DIAGNOSIS — M5440 Lumbago with sciatica, unspecified side: Secondary | ICD-10-CM

## 2017-06-17 DIAGNOSIS — G8929 Other chronic pain: Secondary | ICD-10-CM

## 2017-06-17 DIAGNOSIS — M25611 Stiffness of right shoulder, not elsewhere classified: Secondary | ICD-10-CM

## 2017-06-17 DIAGNOSIS — IMO0002 Reserved for concepts with insufficient information to code with codable children: Secondary | ICD-10-CM

## 2017-06-17 DIAGNOSIS — M6281 Muscle weakness (generalized): Secondary | ICD-10-CM | POA: Diagnosis not present

## 2017-06-17 DIAGNOSIS — E118 Type 2 diabetes mellitus with unspecified complications: Principal | ICD-10-CM

## 2017-06-17 DIAGNOSIS — R293 Abnormal posture: Secondary | ICD-10-CM

## 2017-06-17 DIAGNOSIS — E1165 Type 2 diabetes mellitus with hyperglycemia: Secondary | ICD-10-CM

## 2017-06-17 DIAGNOSIS — Z794 Long term (current) use of insulin: Principal | ICD-10-CM

## 2017-06-17 DIAGNOSIS — M25511 Pain in right shoulder: Secondary | ICD-10-CM

## 2017-06-17 NOTE — Therapy (Signed)
[place], [place], [address] Phone: [phone]   Fax:  [phone]  Physical Therapy Treatment  Patient Details  Name: Jeffery [name] MRN: [id number] Date of Birth: 1960-08-06 Referring Provider: Dr. Frankey Shown    Encounter Date: 06/17/2017  PT End of Session - 06/17/17 1458    Visit Number  7    Number of Visits  15    Date for PT Re-Evaluation  07/21/17    Authorization Type  Medicaid    Authorization Time Period  2/18-3/31    Authorization - Visit Number  3    Authorization - Number of Visits  12    PT Start Time  7564    PT Stop Time  1506    PT Time Calculation (min)  50 min    Activity Tolerance  Patient tolerated treatment well    Behavior During Therapy  [facility] for tasks assessed/performed       Past Medical History:  Diagnosis Date  . Atrial fibrillation ([place])   . Charcot's joint of right foot   . DDD (degenerative disc disease), lumbar   . Diabetes mellitus without complication ([place])   . Fatty liver   . Hypertension   . Obesity   . Rotator cuff disorder   . Shoulder impingement, right     Past Surgical History:  Procedure Laterality Date  . AMPUTATION Left 10/02/2014   Procedure: Left Third toe amputation ;  Surgeon: [name], MD;  Location: [place];  Service: Orthopedics;  Laterality: Left;  Regular bed, wants to follow hip  . ANTERIOR CRUCIATE LIGAMENT REPAIR Right 90   reconstruction  . APPLICATION OF WOUND VAC Left 10/02/2014   Procedure: APPLICATION OF WOUND VAC; toe Surgeon: [name], MD;  Location: [place];  Service: Orthopedics;  Laterality: Left;  . I&D EXTREMITY Left 10/05/2014   Procedure: IRRIGATION AND DEBRIDEMENT LEFT FOOT;  Surgeon: [name], MD;  Location: [place];  Service: Orthopedics;  Laterality: Left;  . KNEE ARTHROSCOPY W/ ACL RECONSTRUCTION Right   . TOTAL KNEE ARTHROPLASTY Right 03/28/2015  . TOTAL KNEE ARTHROPLASTY Right 03/28/2015   Procedure: RIGHT  TOTAL KNEE ARTHROPLASTY;  Surgeon: [name], MD;  Location: [place];  Service: Orthopedics;  Laterality: Right;    There were no vitals filed for this visit.  Subjective Assessment - 06/17/17 1418    Subjective  Pt with less foot pain today.  Patient had increased pain this AM due to sleeping on it.      Currently in Pain?  Yes    Pain Score  3     Pain Location  Shoulder    Pain Orientation  Right    Pain Descriptors / Indicators  Discomfort    Pain Type  Surgical pain    Pain Onset  More than a month ago    Pain Frequency  Intermittent    Aggravating Factors   overuse, sleeping on it     Pain Relieving Factors  ice, rest, meds                       [facility] Adult PT Treatment/Exercise - 06/17/17 0001      Shoulder Exercises: Supine   Horizontal ABduction  Strengthening;Both;20 reps;Theraband    Theraband Level (Shoulder Horizontal ABduction)  Level 3 (Green)    Flexion  Right;15 reps;Weights    Shoulder Flexion Weight (lbs)  3    Other Supine  Exercises  circles x 10 each directions       Shoulder Exercises: Sidelying   External Rotation  Strengthening;Right;15 reps    Flexion  Strengthening;Right;10 reps    ABduction  Strengthening;Right;10 reps      Shoulder Exercises: Pulleys   Flexion  3 minutes    ABduction  3 minutes    Other Pulley Exercises  IR 2 min       Shoulder Exercises: ROM/Strengthening   Other ROM/Strengthening Exercises  practiced lifting 10 lbs varying heights                PT Short Term Goals - 06/17/17 1453      PT SHORT TERM GOAL #1   Title  Pt will be I with initial HEP     Status  Achieved      PT SHORT TERM GOAL #2   Title  Pt will tolerate PROM to near full with min pain overall    Status  Achieved      PT SHORT TERM GOAL #3   Title  Pt will be able to type, do puzzles, and othe fine motor skills without pain increase.     Status  Achieved        PT Long Term Goals - 06/17/17 1447      PT LONG TERM GOAL #1    Title  Pt will understand posture, body mechanics, RICE and lifting to prevent further disability.     Status  On-going      PT LONG TERM GOAL #2   Title  Pt will be I with more advanced HEP for Rt UE strength and ROM.     Status  On-going      PT LONG TERM GOAL #3   Title  Pt will be able to lift 10 lbs just above shoulder height with Rt. UE for improved work and home tasks.     Status  Achieved      PT LONG TERM GOAL #4   Title  Pt will be able to reach behind his back and behind his head for improved ability to complete ADLs, grooming.     Baseline  reaches to tip of R scap as well as R low back     Status  Achieved      PT LONG TERM GOAL #5   Title  Pt will demo 4+/5 or more strength throughout Rt. UE for maximal function to allow safe return to work     Baseline  3+/5 flexion and abduction     Status  On-going            Plan - 06/17/17 1454    Clinical Impression Statement  Patient is near full PROM of all planes of motion.  He was able to lift 10 lbs x 10 overhead using bilateral UEs and to just above shoulder height with min increase in pain.  Unable to safely lift 10 lbs overhead .  Sees MD Monday.      PT Treatment/Interventions  ADLs/Self Care Home Management;Cryotherapy;Ultrasound;Moist Heat;Electrical Stimulation;Balance training;Therapeutic exercise;Therapeutic activities;Functional mobility training;Neuromuscular re-education;Patient/family education;Manual techniques;Taping;Passive range of motion;Vasopneumatic Device    PT Next Visit Plan  encourage  HEP,  Progress strengthening.  Consider prone.      PT Home Exercise Plan  AAROM cane ex, pendulums  ,DC  isometrics , row and extension , red flex, abd and green for ER     Consulted and Agree with Plan of Care  Patient  Patient will benefit from skilled therapeutic intervention in order to improve the following deficits and impairments:  Pain, Postural dysfunction, Impaired UE functional use, Impaired  flexibility, Increased fascial restricitons, Decreased strength, Obesity, Improper body mechanics, Decreased range of motion, Hypomobility, Decreased mobility  Visit Diagnosis: Muscle weakness (generalized)  Stiffness of right shoulder, not elsewhere classified  Acute pain of right shoulder  Abnormal posture  Chronic right-sided low back pain with sciatica, sciatica laterality unspecified     Problem List Patient Active Problem List   Diagnosis Date Noted  . S/P right rotator cuff repair 04/05/2017  . Diabetes mellitus without complication ([place]) 96/75/9163  . Achilles tendon contracture, right 01/05/2017  . Closed nondisplaced fracture of distal phalanx of right great toe 01/05/2017  . Paroxysmal atrial fibrillation ([place]) 10/30/2016  . Pain in right ankle and joints of right foot 07/09/2016  . Diabetic polyneuropathy associated with type 2 diabetes mellitus ([place]) 07/09/2016  . 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 Impingement syndrome of right shoulder 07/07/2016  . Pain in right foot 07/07/2016  . Charcot's joint of right foot 07/03/2016  . Morbid obesity ([place]) 06/05/2016  . S/P total knee replacement using cement 03/28/2015  . Knee osteoarthritis 11/12/2014  . Hypertension 10/15/2014  . Osteomyelitis of toe of left foot ([place])   . Toe osteomyelitis, left ([place]) 10/01/2014  . Charcot foot due to diabetes mellitus ([place]) 10/01/2014  . Accelerated hypertension 10/01/2014  . Gangrene left third toe 10/01/2014  . SHOULDER JOINT INSTABILITY 05/27/2010  . SHOULDER PAIN, RIGHT 05/27/2010    Val Schiavo 06/17/2017, 3:03 PM  [facility] [address] [place], [place], [address] Phone: [phone]   Fax:  [phone]  Name: BUBBER ROTHERT MRN: [id number] Date of Birth: 1960-04-07  Raeford Razor, PT 06/17/17 3:03 PM Phone: [phone] Fax: [phone]

## 2017-06-21 ENCOUNTER — Encounter: Payer: Self-pay | Admitting: Physical Therapy

## 2017-06-21 ENCOUNTER — Ambulatory Visit (INDEPENDENT_AMBULATORY_CARE_PROVIDER_SITE_OTHER): Payer: Medicaid Other | Admitting: Orthopaedic Surgery

## 2017-06-21 ENCOUNTER — Encounter (INDEPENDENT_AMBULATORY_CARE_PROVIDER_SITE_OTHER): Payer: Self-pay | Admitting: Orthopaedic Surgery

## 2017-06-21 ENCOUNTER — Ambulatory Visit: Payer: Medicaid Other | Admitting: Physical Therapy

## 2017-06-21 DIAGNOSIS — M7541 Impingement syndrome of right shoulder: Secondary | ICD-10-CM

## 2017-06-21 DIAGNOSIS — G8929 Other chronic pain: Secondary | ICD-10-CM

## 2017-06-21 DIAGNOSIS — M6281 Muscle weakness (generalized): Secondary | ICD-10-CM

## 2017-06-21 DIAGNOSIS — Z9889 Other specified postprocedural states: Secondary | ICD-10-CM

## 2017-06-21 DIAGNOSIS — R293 Abnormal posture: Secondary | ICD-10-CM

## 2017-06-21 DIAGNOSIS — M25611 Stiffness of right shoulder, not elsewhere classified: Secondary | ICD-10-CM

## 2017-06-21 DIAGNOSIS — M25511 Pain in right shoulder: Secondary | ICD-10-CM

## 2017-06-21 DIAGNOSIS — M5440 Lumbago with sciatica, unspecified side: Secondary | ICD-10-CM

## 2017-06-21 NOTE — Progress Notes (Signed)
Patient is 87 days status post right shoulder arthroscopy with rotator cuff repair and debridement.  He is overall doing well and progressing very well with physical therapy.  His strength and range of motion are both coming along quite nicely.  Surgical scars are fully healed.  He has really good strength with supraspinatus and infraspinatus testing.  He has some mild pain with reaching backwards.  He is very happy overall.  From my standpoint continue with physical therapy and strengthening.  Recheck in 2 months.  If he is doing well I will release him from that standpoint.  I would like to see him back in December of this year for his 3-year follow-up for his right total knee replacement.

## 2017-06-21 NOTE — Therapy (Signed)
[place], [place], [address] Phone: [phone]   Fax:  [phone]  Physical Therapy Treatment  Patient Details  Name: Jeffery [name] MRN: [id number] Date of Birth: [birth year] Referring Provider: Dr. Frankey Shown    Encounter Date: 06/21/2017  PT End of Session - 06/21/17 1815    Visit Number  8    Number of Visits  15    Date for PT Re-Evaluation  07/21/17    Authorization Type  Medicaid    Authorization Time Period  2/18-3/31    Authorization - Visit Number  4    Authorization - Number of Visits  12    PT Start Time  0034    PT Stop Time  1500    PT Time Calculation (min)  43 min    Activity Tolerance  Patient tolerated treatment well    Behavior During Therapy  [facility] for tasks assessed/performed       Past Medical History:  Diagnosis Date  . Atrial fibrillation ([place])   . Charcot's joint of right foot   . DDD (degenerative disc disease), lumbar   . Diabetes mellitus without complication ([place])   . Fatty liver   . Hypertension   . Obesity   . Rotator cuff disorder   . Shoulder impingement, right     Past Surgical History:  Procedure Laterality Date  . AMPUTATION Left 10/02/2014   Procedure: Left Third toe amputation ;  Surgeon: [name], MD;  Location: [place];  Service: Orthopedics;  Laterality: Left;  Regular bed, wants to follow hip  . ANTERIOR CRUCIATE LIGAMENT REPAIR Right 90   reconstruction  . APPLICATION OF WOUND VAC Left 10/02/2014   Procedure: APPLICATION OF WOUND VAC; toe Surgeon: [name], MD;  Location: [place];  Service: Orthopedics;  Laterality: Left;  . I&D EXTREMITY Left 10/05/2014   Procedure: IRRIGATION AND DEBRIDEMENT LEFT FOOT;  Surgeon: [name], MD;  Location: [place];  Service: Orthopedics;  Laterality: Left;  . KNEE ARTHROSCOPY W/ ACL RECONSTRUCTION Right   . TOTAL KNEE ARTHROPLASTY Right 03/28/2015  . TOTAL KNEE ARTHROPLASTY Right 03/28/2015   Procedure: RIGHT  TOTAL KNEE ARTHROPLASTY;  Surgeon: [name], MD;  Location: [place];  Service: Orthopedics;  Laterality: Right;    There were no vitals filed for this visit.  Subjective Assessment - 06/21/17 1427    Subjective  Saw MD  .  He was pleased . I go back to MD on May 28 and he will discharge.   He said  I would reccomend not picking up heavy things of 25 for a year yet.     Currently in Pain?  Yes    Pain Score  3  with pain meds    Pain Orientation  Right    Pain Descriptors / Indicators  Discomfort    Pain Frequency  Intermittent    Aggravating Factors   overuse    Pain Relieving Factors  ice rest,  meds    Multiple Pain Sites  -- up to 10/10  charcot  foot.  5/10 at rest.  better with  BOOT                No data recorded       [facility] Adult PT Treatment/Exercise - 06/21/17 0001      Shoulder Exercises: Seated   Other Seated Exercises  barbell 3,4,5 LBS tap knee, shoulder the overhead  toward ceiling  10  x each less compensation with 5 LBS noted    Other Seated Exercises  barbell 3,4,5 LBS tap shoulder then stretch out to 90 10 x each      Shoulder Exercises: Standing   External Rotation  10 reps    Theraband Level (Shoulder External Rotation)  Level 4 (Blue)    Internal Rotation  15 reps Level 4    Extension  15 reps    Theraband Level (Shoulder Extension)  Level 4 (Blue)    Row  15 reps    Theraband Level (Shoulder Row)  Level 4 (Blue)      Shoulder Exercises: Pulleys   Flexion  3 minutes    ABduction  3 minutes    Other Pulley Exercises  IR 10 X      Shoulder Exercises: ROM/Strengthening   UBE (Upper Arm Bike)  L2 3 minutes each way      Manual Therapy   Passive ROM  ER X 5             PT Education - 06/21/17 1815    Education provided  No       PT Short Term Goals - 06/21/17 1818      PT SHORT TERM GOAL #1   Title  Pt will be I with initial HEP     Time  4    Period  Weeks    Status  Achieved      PT SHORT TERM GOAL #2   Title  Pt  will tolerate PROM to near full with min pain overall    Baseline  goal met     Time  4    Period  Weeks    Status  Achieved      PT SHORT TERM GOAL #3   Title  Pt will be able to type, do puzzles, and othe fine motor skills without pain increase.     Baseline  goal met     Time  4    Period  Weeks    Status  Achieved        PT Long Term Goals - 06/21/17 1820      PT LONG TERM GOAL #1   Title  Pt will understand posture, body mechanics, RICE and lifting to prevent further disability.     Baseline  Continues to need cues for sitting posture.  Understands RICE.    Lifting  at home unmonitored.  Just beginning lifting light objects in clinic.  (5 LBS today)    Time  8    Period  Weeks    Status  On-going      PT LONG TERM GOAL #2   Title  Pt will be I with more advanced HEP for Rt UE strength and ROM.     Baseline  independent with exercises issued so far    Time  8    Period  Weeks    Status  On-going      PT LONG TERM GOAL #3   Baseline  able to lift 5 LBS    Time  8    Period  Weeks    Status  Achieved      PT LONG TERM GOAL #4   Title  Pt will be able to reach behind his back and behind his head for improved ability to complete ADLs, grooming.     Time  8    Period  Weeks    Status  Achieved  PT LONG TERM GOAL #5   Title  Pt will demo 4+/5 or more strength throughout Rt. UE for maximal function to allow safe return to work     Baseline  flexion at least 4-/5    Time  8    Period  Weeks    Status  On-going            Plan - 06/21/17 1816    Clinical Impression Statement  Patient continues to work on strengthening.  he was able to use good technique with barbell, 5 LBS, reach toward ceiling 10 X,  Mild shoulder pain at end of session.  he declined the need of modalities.  MD is happy with shoulder.     PT Next Visit Plan    ERO for Medicare extend??  encourage  HEP,  Progress strengthening.  Consider prone.      PT Home Exercise Plan  AAROM cane ex,  pendulums  ,DC  isometrics , row and extension , red flex, abd and green for ER     Consulted and Agree with Plan of Care  Patient       Patient will benefit from skilled therapeutic intervention in order to improve the following deficits and impairments:     Visit Diagnosis: Muscle weakness (generalized)  Stiffness of right shoulder, not elsewhere classified  Acute pain of right shoulder  Abnormal posture  Chronic right-sided low back pain with sciatica, sciatica laterality unspecified     Problem List Patient Active Problem List   Diagnosis Date Noted  . S/P right rotator cuff repair 04/05/2017  . Diabetes mellitus without complication ([place]) 69/62/9528  . Achilles tendon contracture, right 01/05/2017  . Closed nondisplaced fracture of distal phalanx of right great toe 01/05/2017  . Paroxysmal atrial fibrillation ([place]) 10/30/2016  . Pain in right ankle and joints of right foot 07/09/2016  . Diabetic polyneuropathy associated with type 2 diabetes mellitus ([place]) 07/09/2016  . 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 Impingement syndrome of right shoulder 07/07/2016  . Pain in right foot 07/07/2016  . Charcot's joint of right foot 07/03/2016  . Morbid obesity ([place]) 06/05/2016  . S/P total knee replacement using cement 03/28/2015  . Knee osteoarthritis 11/12/2014  . Hypertension 10/15/2014  . Osteomyelitis of toe of left foot ([place])   . Toe osteomyelitis, left ([place]) 10/01/2014  . Charcot foot due to diabetes mellitus ([place]) 10/01/2014  . Accelerated hypertension 10/01/2014  . Gangrene left third toe 10/01/2014  . SHOULDER JOINT INSTABILITY 05/27/2010  . SHOULDER PAIN, RIGHT 05/27/2010    HARRIS,KAREN PTA 06/21/2017, 6:23 PM  [facility] [address] [place], [place], [address] Phone: [phone]   Fax:  [phone]  Name: Jeffery [name] MRN:  [id number] Date of Birth: 1961/01/09

## 2017-06-23 ENCOUNTER — Ambulatory Visit: Payer: Medicaid Other | Admitting: Physical Therapy

## 2017-06-23 DIAGNOSIS — G8929 Other chronic pain: Secondary | ICD-10-CM

## 2017-06-23 DIAGNOSIS — M6281 Muscle weakness (generalized): Secondary | ICD-10-CM

## 2017-06-23 DIAGNOSIS — M25611 Stiffness of right shoulder, not elsewhere classified: Secondary | ICD-10-CM

## 2017-06-23 DIAGNOSIS — R293 Abnormal posture: Secondary | ICD-10-CM

## 2017-06-23 DIAGNOSIS — M25511 Pain in right shoulder: Secondary | ICD-10-CM

## 2017-06-23 DIAGNOSIS — M5440 Lumbago with sciatica, unspecified side: Secondary | ICD-10-CM

## 2017-06-23 NOTE — Therapy (Signed)
[place], [place], [address] Phone: [phone]   Fax:  [phone]  Physical Therapy Treatment/Renewal   Patient Details  Name: Jeffery [name] MRN: [id number] Date of Birth: Dec 05, 1960 Referring Provider: Dr. Frankey Shown    Encounter Date: 06/23/2017  PT End of Session - 06/23/17 1424    Visit Number  9    Date for PT Re-Evaluation  07/21/17    Authorization Type  Medicaid    Authorization Time Period  2/18-3/31    Authorization - Visit Number  5    Authorization - Number of Visits  12    PT Start Time  7622    PT Stop Time  1505    PT Time Calculation (min)  46 min    Activity Tolerance  Patient tolerated treatment well    Behavior During Therapy  [facility] for tasks assessed/performed       Past Medical History:  Diagnosis Date  . Atrial fibrillation ([place])   . Charcot's joint of right foot   . DDD (degenerative disc disease), lumbar   . Diabetes mellitus without complication ([place])   . Fatty liver   . Hypertension   . Obesity   . Rotator cuff disorder   . Shoulder impingement, right     Past Surgical History:  Procedure Laterality Date  . AMPUTATION Left 10/02/2014   Procedure: Left Third toe amputation ;  Surgeon: [name], MD;  Location: [place];  Service: Orthopedics;  Laterality: Left;  Regular bed, wants to follow hip  . ANTERIOR CRUCIATE LIGAMENT REPAIR Right 90   reconstruction  . APPLICATION OF WOUND VAC Left 10/02/2014   Procedure: APPLICATION OF WOUND VAC; toe Surgeon: [name], MD;  Location: [place];  Service: Orthopedics;  Laterality: Left;  . I&D EXTREMITY Left 10/05/2014   Procedure: IRRIGATION AND DEBRIDEMENT LEFT FOOT;  Surgeon: [name], MD;  Location: [place];  Service: Orthopedics;  Laterality: Left;  . KNEE ARTHROSCOPY W/ ACL RECONSTRUCTION Right   . TOTAL KNEE ARTHROPLASTY Right 03/28/2015  . TOTAL KNEE ARTHROPLASTY Right 03/28/2015   Procedure: RIGHT TOTAL KNEE  ARTHROPLASTY;  Surgeon: [name], MD;  Location: [place];  Service: Orthopedics;  Laterality: Right;    There were no vitals filed for this visit.  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     Currently in Pain?  Yes    Pain Score  4     Pain Location  Shoulder    Pain Orientation  Right    Pain Descriptors / Indicators  Aching    Pain Type  Surgical pain    Pain Onset  More than a month ago    Pain Frequency  Intermittent    Aggravating Factors   doing too much     Pain Relieving Factors  ice, rest, meds          [facility] PT Assessment - 06/23/17 0001      AROM   Right Shoulder Flexion  150 Degrees    Right Shoulder ABduction  120 Degrees    Right Shoulder Internal Rotation  -- FR to T11    Right Shoulder External Rotation  -- FR to T1      Strength   Right Shoulder Flexion  4-/5    Right Shoulder ABduction  3+/5    Right Shoulder Internal Rotation  5/5  Right Shoulder External Rotation  4/5         [facility] Adult PT Treatment/Exercise - 06/23/17 0001      Shoulder Exercises: ROM/Strengthening   Nustep  UE and LE for 6 min L8- L 10       Shoulder Exercises: Power Development worker, community  15 reps    Extension Limitations  2 plates , cues for thoracic ext     Retraction Limitations  throughout exercises     Row  15 reps    Row Limitations  25 lbs     External Rotation  10 reps    External Rotation Limitations  2 x 10 1 plate     Flexion  10 reps;Other (comment)    Flexion Limitations  1 plate , 2 sets  "serving"     Other [employer] Exercises  lat pull down 25 lbs x 10 manual cues     Other [employer]  used [employer] and [employer]       Cryotherapy   Number Minutes Cryotherapy  8 Minutes    Cryotherapy Location  Shoulder    Type of Cryotherapy  Ice pack      Manual Therapy   Passive ROM  all planes, distraction and joint mobs, Gr I -II              PT Education - 06/23/17 1431     Education provided  Yes    Education Details  gym exercises , cardio options     Person(s) Educated  Patient    Methods  Explanation    Comprehension  Verbalized understanding       PT Short Term Goals - 06/23/17 1443      PT SHORT TERM GOAL #1   Title  Pt will be I with initial HEP     Baseline  goal met for AAROM ,pendulum, isometric    Status  Achieved      PT SHORT TERM GOAL #2   Title  Pt will tolerate PROM to near full with min pain overall    Status  Achieved      PT SHORT TERM GOAL #3   Title  Pt will be able to type, do puzzles, and othe fine motor skills without pain increase.     Status  Achieved        PT Long Term Goals - 06/23/17 1443      PT LONG TERM GOAL #1   Title  Pt will understand posture, body mechanics, RICE and lifting to prevent further disability.     Status  On-going      PT LONG TERM GOAL #2   Title  Pt will be I with more advanced HEP for Rt UE strength and ROM.     Status  On-going      PT LONG TERM GOAL #3   Title  Pt will be able to lift 10 lbs just above shoulder height with Rt. UE for improved work and home tasks.     Status  Partially Met      PT LONG TERM GOAL #4   Title  Pt will be able to reach behind his back and behind his head for improved ability to complete ADLs, grooming.     Status  Achieved      PT LONG TERM GOAL #5   Title  Pt will demo 4+/5 or more strength throughout Rt. UE for maximal function to allow safe return to  work     Baseline  flexion at least 4-/5    Status  On-going            Plan - 06/23/17 1458    Clinical Impression Statement  Patient continues to improve, but he has some degree of weakness in shoulder flexion, along with periscapular weakness.  He needs consistent cues for technique and form related to head neck and shoulder organization.  He has good ROM but lacks strength throughout his available ROM.     PT Treatment/Interventions  ADLs/Self Care Home  Management;Cryotherapy;Ultrasound;Moist Heat;Electrical Stimulation;Balance training;Therapeutic exercise;Therapeutic activities;Functional mobility training;Neuromuscular re-education;Patient/family education;Manual techniques;Taping;Passive range of motion;Vasopneumatic Device    PT Next Visit Plan  Encourage  HEP,  Progress strengthening.  Consider prone.  Form with exercise, include gym exercises.     PT Home Exercise Plan  AAROM cane ex, pendulums  ,DC  isometrics , row and extension , red flex, abd and green for ER     Consulted and Agree with Plan of Care  Patient       Patient will benefit from skilled therapeutic intervention in order to improve the following deficits and impairments:  Pain, Postural dysfunction, Impaired UE functional use, Impaired flexibility, Increased fascial restricitons, Decreased strength, Obesity, Improper body mechanics, Decreased range of motion, Hypomobility, Decreased mobility  Visit Diagnosis: Muscle weakness (generalized)  Stiffness of right shoulder, not elsewhere classified  Acute pain of right shoulder  Abnormal posture  Chronic right-sided low back pain with sciatica, sciatica laterality unspecified     Problem List Patient Active Problem List   Diagnosis Date Noted  . S/P right rotator cuff repair 04/05/2017  . Diabetes mellitus without complication ([place]) 16/12/9602  . Achilles tendon contracture, right 01/05/2017  . Closed nondisplaced fracture of distal phalanx of right great toe 01/05/2017  . Paroxysmal atrial fibrillation ([place]) 10/30/2016  . Pain in right ankle and joints of right foot 07/09/2016  . Diabetic polyneuropathy associated with type 2 diabetes mellitus ([place]) 07/09/2016  . 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 Impingement syndrome of right shoulder 07/07/2016  . Pain in right foot 07/07/2016  . Charcot's joint of right foot 07/03/2016  . Morbid obesity ([place])  06/05/2016  . S/P total knee replacement using cement 03/28/2015  . Knee osteoarthritis 11/12/2014  . Hypertension 10/15/2014  . Osteomyelitis of toe of left foot ([place])   . Toe osteomyelitis, left ([place]) 10/01/2014  . Charcot foot due to diabetes mellitus ([place]) 10/01/2014  . Accelerated hypertension 10/01/2014  . Gangrene left third toe 10/01/2014  . SHOULDER JOINT INSTABILITY 05/27/2010  . SHOULDER PAIN, RIGHT 05/27/2010    Edie Vallandingham 06/23/2017, 3:01 PM  [facility] [address] [place], [place], [address] Phone: [phone]   Fax:  [phone]  Name: Jeffery [name] MRN: [id number] Date of Birth: [birth year]  Raeford Razor, PT 06/23/17 3:01 PM Phone: [phone] Fax: [phone]

## 2017-07-04 ENCOUNTER — Other Ambulatory Visit: Payer: Self-pay | Admitting: Family Medicine

## 2017-07-04 DIAGNOSIS — E118 Type 2 diabetes mellitus with unspecified complications: Principal | ICD-10-CM

## 2017-07-04 DIAGNOSIS — Z794 Long term (current) use of insulin: Principal | ICD-10-CM

## 2017-07-04 DIAGNOSIS — E1165 Type 2 diabetes mellitus with hyperglycemia: Secondary | ICD-10-CM

## 2017-07-04 DIAGNOSIS — IMO0002 Reserved for concepts with insufficient information to code with codable children: Secondary | ICD-10-CM

## 2017-07-06 ENCOUNTER — Encounter: Payer: Self-pay | Admitting: Physical Therapy

## 2017-07-06 ENCOUNTER — Ambulatory Visit: Payer: Medicaid Other | Attending: Orthopaedic Surgery | Admitting: Physical Therapy

## 2017-07-06 DIAGNOSIS — M6281 Muscle weakness (generalized): Secondary | ICD-10-CM | POA: Insufficient documentation

## 2017-07-06 DIAGNOSIS — R293 Abnormal posture: Secondary | ICD-10-CM | POA: Diagnosis present

## 2017-07-06 DIAGNOSIS — M25611 Stiffness of right shoulder, not elsewhere classified: Secondary | ICD-10-CM | POA: Diagnosis present

## 2017-07-06 DIAGNOSIS — M25511 Pain in right shoulder: Secondary | ICD-10-CM | POA: Insufficient documentation

## 2017-07-06 DIAGNOSIS — M5440 Lumbago with sciatica, unspecified side: Secondary | ICD-10-CM | POA: Insufficient documentation

## 2017-07-06 DIAGNOSIS — G8929 Other chronic pain: Secondary | ICD-10-CM

## 2017-07-06 NOTE — Therapy (Addendum)
[place], [place], [address] Phone: [phone]   Fax:  [phone]  Physical Therapy Treatment/Addended Discharge   Patient Details  Name: Jeffery [name] MRN: [id number] Date of Birth: [birth year] Referring Provider: Dr. Frankey Shown    Encounter Date: 07/06/2017  PT End of Session - 07/06/17 1838    Visit Number  10    Number of Visits  17    Date for PT Re-Evaluation  07/28/17    Authorization Type  Medicaid    Authorization Time Period  2/18-3/31    Authorization - Visit Number  -- re evAL    PT Start Time  1330    PT Stop Time  1426    PT Time Calculation (min)  56 min    Activity Tolerance  Patient tolerated treatment well    Behavior During Therapy  [facility] for tasks assessed/performed       Past Medical History:  Diagnosis Date  . Atrial fibrillation ([place])   . Charcot's joint of right foot   . DDD (degenerative disc disease), lumbar   . Diabetes mellitus without complication ([place])   . Fatty liver   . Hypertension   . Obesity   . Rotator cuff disorder   . Shoulder impingement, right     Past Surgical History:  Procedure Laterality Date  . AMPUTATION Left 10/02/2014   Procedure: Left Third toe amputation ;  Surgeon: [name], MD;  Location: [place];  Service: Orthopedics;  Laterality: Left;  Regular bed, wants to follow hip  . ANTERIOR CRUCIATE LIGAMENT REPAIR Right 90   reconstruction  . APPLICATION OF WOUND VAC Left 10/02/2014   Procedure: APPLICATION OF WOUND VAC; toe Surgeon: [name], MD;  Location: [place];  Service: Orthopedics;  Laterality: Left;  . I&D EXTREMITY Left 10/05/2014   Procedure: IRRIGATION AND DEBRIDEMENT LEFT FOOT;  Surgeon: [name], MD;  Location: [place];  Service: Orthopedics;  Laterality: Left;  . KNEE ARTHROSCOPY W/ ACL RECONSTRUCTION Right   . TOTAL KNEE ARTHROPLASTY Right 03/28/2015  . TOTAL KNEE ARTHROPLASTY Right 03/28/2015   Procedure: RIGHT TOTAL KNEE  ARTHROPLASTY;  Surgeon: [name], MD;  Location: [place];  Service: Orthopedics;  Laterality: Right;    There were no vitals filed for this visit.  Subjective Assessment - 07/06/17 1335    Subjective  Joined [employer].  Had a sharp pain in shoulder as i brought my arm back down from over my head (was laying down) . calmed down a bit .  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      Currently in Pain?  Yes    Pain Score  4     Pain Location  Shoulder    Pain Orientation  Right    Pain Descriptors / Indicators  Aching    Pain Type  Surgical pain    Pain Onset  More than a month ago    Pain Frequency  Intermittent         [facility] Adult PT Treatment/Exercise - 07/06/17 0001      Shoulder Exercises: Sidelying   External Rotation  Strengthening;Right;15 reps    ABduction  Strengthening;Right;10 reps    Other Sidelying Exercises  scapular rhythmic stab with PT       Shoulder Exercises: Standing   Flexion  Strengthening;Right;10 reps    ABduction  Strengthening;Right;10 reps    Other Standing Exercises  functional reaching 10 lbs Rt UE overhead  x 5, bilateral 10 lbs top shelf, 8 lbs to chin hgt x 10 with just Rt UE.       Shoulder Exercises: Pulleys   Flexion  2 minutes      Shoulder Exercises: ROM/Strengthening   UBE (Upper Arm Bike)  L3 for 5 min 3 min FW and 3 min back     Lat Pull  20 reps 5 plates (25 lbs)     Cybex Row  15 reps 5 plates 2 x 15 cues for scapular retraction    Other ROM/Strengthening Exercises  practiced lifting 10 lbs varying heights       Cryotherapy   Cryotherapy Location  Shoulder      Manual Therapy   Joint Mobilization  scapular, manual resistance for stabilization.     Passive ROM  all planes, distraction and joint mobs, Gr I -II              PT Education - 07/06/17 1837    Education provided  Yes    Education Details  scapular position, gym equipment     Person(s) Educated  Patient    Methods  Explanation    Comprehension  Verbalized  understanding;Need further instruction       PT Short Term Goals - 06/23/17 1443      PT SHORT TERM GOAL #1   Title  Pt will be I with initial HEP     Baseline  goal met for AAROM ,pendulum, isometric    Status  Achieved      PT SHORT TERM GOAL #2   Title  Pt will tolerate PROM to near full with min pain overall    Status  Achieved      PT SHORT TERM GOAL #3   Title  Pt will be able to type, do puzzles, and othe fine motor skills without pain increase.     Status  Achieved        PT Long Term Goals - 06/23/17 1443      PT LONG TERM GOAL #1   Title  Pt will understand posture, body mechanics, RICE and lifting to prevent further disability.     Status  On-going      PT LONG TERM GOAL #2   Title  Pt will be I with more advanced HEP for Rt UE strength and ROM.     Status  On-going      PT LONG TERM GOAL #3   Title  Pt will be able to lift 10 lbs just above shoulder height with Rt. UE for improved work and home tasks.     Status  Partially Met      PT LONG TERM GOAL #4   Title  Pt will be able to reach behind his back and behind his head for improved ability to complete ADLs, grooming.     Status  Achieved      PT LONG TERM GOAL #5   Title  Pt will demo 4+/5 or more strength throughout Rt. UE for maximal function to allow safe return to work     Baseline  flexion at least 4-/5    Status  On-going            Plan - 07/06/17 1832    Clinical Impression Statement  Patient with scapular weakness, has less pain lifting and lowering arm with lower trap and rhomboid activation. Near full PROM except for IR. Progressing towards goals. Used gym equipment but needed mod cues for  maintaining scpaular neutral.     PT Treatment/Interventions  ADLs/Self Care Home Management;Cryotherapy;Ultrasound;Moist Heat;Electrical Stimulation;Balance training;Therapeutic exercise;Therapeutic activities;Functional mobility training;Neuromuscular re-education;Patient/family education;Manual  techniques;Taping;Passive range of motion;Vasopneumatic Device    PT Next Visit Plan  Try body blade Encourage  HEP,  Progress strengthening.  Consider prone with pillows for low back pain .  Form with exercise, include gym exercises.     PT Home Exercise Plan  AAROM cane ex, pendulums  ,DC  isometrics , row and extension , red flex, abd and green for ER     Consulted and Agree with Plan of Care  Patient       Patient will benefit from skilled therapeutic intervention in order to improve the following deficits and impairments:  Pain, Postural dysfunction, Impaired UE functional use, Impaired flexibility, Increased fascial restricitons, Decreased strength, Obesity, Improper body mechanics, Decreased range of motion, Hypomobility, Decreased mobility  Visit Diagnosis: Muscle weakness (generalized)  Stiffness of right shoulder, not elsewhere classified  Acute pain of right shoulder  Abnormal posture  Chronic right-sided low back pain with sciatica, sciatica laterality unspecified     Problem List Patient Active Problem List   Diagnosis Date Noted  . S/P right rotator cuff repair 04/05/2017  . Diabetes mellitus without complication ([place]) 94/50/3888  . Achilles tendon contracture, right 01/05/2017  . Closed nondisplaced fracture of distal phalanx of right great toe 01/05/2017  . Paroxysmal atrial fibrillation ([place]) 10/30/2016  . Pain in right ankle and joints of right foot 07/09/2016  . Diabetic polyneuropathy associated with type 2 diabetes mellitus ([place]) 07/09/2016  . 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 Impingement syndrome of right shoulder 07/07/2016  . Pain in right foot 07/07/2016  . Charcot's joint of right foot 07/03/2016  . Morbid obesity ([place]) 06/05/2016  . S/P total knee replacement using cement 03/28/2015  . Knee osteoarthritis 11/12/2014  . Hypertension 10/15/2014  . Osteomyelitis of toe of left foot ([place])    . Toe osteomyelitis, left ([place]) 10/01/2014  . Charcot foot due to diabetes mellitus ([place]) 10/01/2014  . Accelerated hypertension 10/01/2014  . Gangrene left third toe 10/01/2014  . SHOULDER JOINT INSTABILITY 05/27/2010  . SHOULDER PAIN, RIGHT 05/27/2010    Redding Cloe 07/06/2017, 6:39 PM  [facility] [address] [place], [place], [address] Phone: [phone]   Fax:  [phone]  Name: RESHARD GUILLET MRN: [id number] Date of Birth: 27-Feb-1961  Raeford Razor, PT 07/06/17 6:39 PM Phone: [phone] Fax: [phone]   PHYSICAL THERAPY DISCHARGE SUMMARY  Visits from Start of Care: 10   Current functional level related to goals / functional outcomes: See above.     Remaining deficits: At the time of this visit, he was lacking end ROM and had mild strength deficits.   Education / Equipment: HEP, RICE, posture, lifting  Plan: Patient agrees to discharge.  Patient goals were partially met. Patient is being discharged due to being pleased with the current functional level.  ?????    Raeford Razor, PT 09/09/17 2:51 PM Phone: [phone] Fax: [phone]

## 2017-07-08 ENCOUNTER — Ambulatory Visit: Payer: Medicaid Other | Admitting: Physical Therapy

## 2017-07-09 ENCOUNTER — Other Ambulatory Visit: Payer: Self-pay | Admitting: Family Medicine

## 2017-07-09 DIAGNOSIS — E118 Type 2 diabetes mellitus with unspecified complications: Principal | ICD-10-CM

## 2017-07-09 DIAGNOSIS — Z794 Long term (current) use of insulin: Principal | ICD-10-CM

## 2017-07-09 DIAGNOSIS — E1165 Type 2 diabetes mellitus with hyperglycemia: Secondary | ICD-10-CM

## 2017-07-09 DIAGNOSIS — IMO0002 Reserved for concepts with insufficient information to code with codable children: Secondary | ICD-10-CM

## 2017-07-12 ENCOUNTER — Encounter: Payer: Medicaid Other | Admitting: Physical Therapy

## 2017-07-14 ENCOUNTER — Ambulatory Visit: Payer: Medicaid Other | Admitting: Physical Therapy

## 2017-07-19 ENCOUNTER — Ambulatory Visit: Payer: Medicaid Other | Admitting: Physical Therapy

## 2017-07-22 ENCOUNTER — Encounter: Payer: Medicaid Other | Admitting: Physical Therapy

## 2017-07-27 ENCOUNTER — Other Ambulatory Visit: Payer: Self-pay | Admitting: Family Medicine

## 2017-07-27 DIAGNOSIS — E1165 Type 2 diabetes mellitus with hyperglycemia: Secondary | ICD-10-CM

## 2017-07-27 DIAGNOSIS — IMO0002 Reserved for concepts with insufficient information to code with codable children: Secondary | ICD-10-CM

## 2017-07-27 DIAGNOSIS — Z794 Long term (current) use of insulin: Principal | ICD-10-CM

## 2017-07-27 DIAGNOSIS — E118 Type 2 diabetes mellitus with unspecified complications: Principal | ICD-10-CM

## 2017-08-02 ENCOUNTER — Telehealth: Payer: Self-pay | Admitting: Family Medicine

## 2017-08-02 DIAGNOSIS — E1165 Type 2 diabetes mellitus with hyperglycemia: Secondary | ICD-10-CM

## 2017-08-02 DIAGNOSIS — Z794 Long term (current) use of insulin: Principal | ICD-10-CM

## 2017-08-02 DIAGNOSIS — E118 Type 2 diabetes mellitus with unspecified complications: Principal | ICD-10-CM

## 2017-08-02 DIAGNOSIS — IMO0002 Reserved for concepts with insufficient information to code with codable children: Secondary | ICD-10-CM

## 2017-08-02 NOTE — Telephone Encounter (Signed)
Pt came in to request a refill on  -Insulin Glargine (LANTUS SOLOSTAR) 100 UNIT/ML Solostar Pen  To -Ammie Ferrier [address] [place] [address]

## 2017-08-03 MED ORDER — INSULIN GLARGINE 100 UNIT/ML SOLOSTAR PEN: PEN_INJECTOR | SUBCUTANEOUS | 0 refills | Status: DC

## 2017-08-03 NOTE — Telephone Encounter (Signed)
Refilled.  He needs an appointment.

## 2017-08-24 ENCOUNTER — Ambulatory Visit (INDEPENDENT_AMBULATORY_CARE_PROVIDER_SITE_OTHER): Payer: Medicaid Other | Admitting: Orthopaedic Surgery

## 2017-08-24 DIAGNOSIS — M25511 Pain in right shoulder: Secondary | ICD-10-CM | POA: Diagnosis not present

## 2017-08-24 NOTE — Progress Notes (Signed)
Patient: Jeffery [name]           Date of Birth: 12/24/1960           MRN: [id number] Visit Date: 08/24/2017 PCP: [name], MD   Assessment & Plan:  Chief Complaint:  Chief Complaint  Patient presents with  . Right Shoulder - Follow-up    5 mos post arthroscopy  . Right Foot - Callouses   Visit Diagnoses:  1. Pain in joint of right shoulder     Plan: Patient is a pleasant 58 year old gentleman who presents to our clinic today 5 months status post right shoulder arthroscopic debridement, subacromial decompression distal clavicle excision date of surgery 03/26/2017.  He has finished formal physical therapy and is recently joined [employer].  He is regained near full motion and strength to the right upper extremity.  Examination of the right shoulder reveals full active range of motion in all planes.  Full strength throughout.  He is neurovascularly intact distally.  At this point, he will continue working on his home exercise program at the gym.  Follow-up with [place] this December for his right total knee replacement.  Call if concerns or questions in the meantime.  Follow-Up Instructions: Return in about 6 months (around 02/24/2018) for fu right TKR.   Orders:  No orders of the defined types were placed in this encounter.  No orders of the defined types were placed in this encounter.   Imaging: No results found.  PMFS History: Patient Active Problem List   Diagnosis Date Noted  . S/P right rotator cuff repair 04/05/2017  . Diabetes mellitus without complication ([place]) 36/14/4315  . Achilles tendon contracture, right 01/05/2017  . Closed nondisplaced fracture of distal phalanx of right great toe 01/05/2017  . Paroxysmal atrial fibrillation ([place]) 10/30/2016  . Pain in right ankle and joints of right foot 07/09/2016  . Diabetic polyneuropathy associated with type 2 diabetes mellitus ([place]) 07/09/2016  . 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 Impingement syndrome of right shoulder 07/07/2016  . Pain in right foot 07/07/2016  . Charcot's joint of right foot 07/03/2016  . Morbid obesity ([place]) 06/05/2016  . S/P total knee replacement using cement 03/28/2015  . Knee osteoarthritis 11/12/2014  . Hypertension 10/15/2014  . Osteomyelitis of toe of left foot ([place])   . Toe osteomyelitis, left ([place]) 10/01/2014  . Charcot foot due to diabetes mellitus ([place]) 10/01/2014  . Accelerated hypertension 10/01/2014  . Gangrene left third toe 10/01/2014  . SHOULDER JOINT INSTABILITY 05/27/2010  . SHOULDER PAIN, RIGHT 05/27/2010   Past Medical History:  Diagnosis Date  . Atrial fibrillation ([place])   . Charcot's joint of right foot   . DDD (degenerative disc disease), lumbar   . Diabetes mellitus without complication ([place])   . Fatty liver   . Hypertension   . Obesity   . Rotator cuff disorder   . Shoulder impingement, right     Family History  Problem Relation Age of Onset  . Diabetes Father   . Hypertension Father   . Heart failure Father     Past Surgical History:  Procedure Laterality Date  . AMPUTATION Left 10/02/2014   Procedure: Left Third toe amputation ;  Surgeon: [name], MD;  Location: [place];  Service: Orthopedics;  Laterality: Left;  Regular bed, wants to follow hip  . ANTERIOR CRUCIATE LIGAMENT REPAIR Right 90   reconstruction  . APPLICATION OF WOUND VAC  Left 10/02/2014   Procedure: APPLICATION OF WOUND VAC; toe Surgeon: [name], MD;  Location: [place];  Service: Orthopedics;  Laterality: Left;  . I&D EXTREMITY Left 10/05/2014   Procedure: IRRIGATION AND DEBRIDEMENT LEFT FOOT;  Surgeon: [name], MD;  Location: [place];  Service: Orthopedics;  Laterality: Left;  . KNEE ARTHROSCOPY W/ ACL RECONSTRUCTION Right   . TOTAL KNEE ARTHROPLASTY Right 03/28/2015  . TOTAL KNEE ARTHROPLASTY Right 03/28/2015   Procedure: RIGHT TOTAL KNEE ARTHROPLASTY;  Surgeon: [name], MD;   Location: [place];  Service: Orthopedics;  Laterality: Right;   Social History   Occupational History  . Occupation: Management consultant  Tobacco Use  . Smoking status: Former Smoker    Packs/day: 0.00    Years: 38.00    Pack years: 0.00    Types: Cigarettes  . Smokeless tobacco: Never Used  Substance and Sexual Activity  . Alcohol use: No    Alcohol/week: 3.0 - 3.6 oz    Types: 5 - 6 Cans of beer per week  . Drug use: Yes    Types: Marijuana    Comment: last week ; denies 03/24/17  . Sexual activity: Not on file

## 2017-08-25 ENCOUNTER — Other Ambulatory Visit: Payer: Self-pay | Admitting: Family Medicine

## 2017-08-25 DIAGNOSIS — M5441 Lumbago with sciatica, right side: Principal | ICD-10-CM

## 2017-08-25 DIAGNOSIS — G8929 Other chronic pain: Secondary | ICD-10-CM

## 2017-08-25 DIAGNOSIS — M5442 Lumbago with sciatica, left side: Principal | ICD-10-CM

## 2017-09-05 ENCOUNTER — Other Ambulatory Visit: Payer: Self-pay | Admitting: Family Medicine

## 2017-09-13 ENCOUNTER — Other Ambulatory Visit: Payer: Self-pay | Admitting: Family Medicine

## 2017-09-13 DIAGNOSIS — IMO0002 Reserved for concepts with insufficient information to code with codable children: Secondary | ICD-10-CM

## 2017-09-13 DIAGNOSIS — Z794 Long term (current) use of insulin: Principal | ICD-10-CM

## 2017-09-13 DIAGNOSIS — E118 Type 2 diabetes mellitus with unspecified complications: Principal | ICD-10-CM

## 2017-09-13 DIAGNOSIS — E1165 Type 2 diabetes mellitus with hyperglycemia: Secondary | ICD-10-CM

## 2017-09-14 ENCOUNTER — Encounter: Payer: Self-pay | Admitting: Family Medicine

## 2017-09-14 ENCOUNTER — Ambulatory Visit: Payer: Medicaid Other | Attending: Family Medicine | Admitting: Family Medicine

## 2017-09-14 VITALS — BP 131/77 | HR 78 | Temp 98.0°F | Ht 75.0 in | Wt 322.0 lb

## 2017-09-14 DIAGNOSIS — E118 Type 2 diabetes mellitus with unspecified complications: Secondary | ICD-10-CM

## 2017-09-14 DIAGNOSIS — Z96651 Presence of right artificial knee joint: Secondary | ICD-10-CM | POA: Diagnosis not present

## 2017-09-14 DIAGNOSIS — M5442 Lumbago with sciatica, left side: Secondary | ICD-10-CM | POA: Diagnosis not present

## 2017-09-14 DIAGNOSIS — IMO0002 Reserved for concepts with insufficient information to code with codable children: Secondary | ICD-10-CM

## 2017-09-14 DIAGNOSIS — E1165 Type 2 diabetes mellitus with hyperglycemia: Secondary | ICD-10-CM | POA: Diagnosis not present

## 2017-09-14 DIAGNOSIS — I10 Essential (primary) hypertension: Secondary | ICD-10-CM | POA: Diagnosis not present

## 2017-09-14 DIAGNOSIS — M5416 Radiculopathy, lumbar region: Secondary | ICD-10-CM | POA: Diagnosis not present

## 2017-09-14 DIAGNOSIS — E119 Type 2 diabetes mellitus without complications: Secondary | ICD-10-CM

## 2017-09-14 DIAGNOSIS — E11610 Type 2 diabetes mellitus with diabetic neuropathic arthropathy: Secondary | ICD-10-CM | POA: Insufficient documentation

## 2017-09-14 DIAGNOSIS — Z7901 Long term (current) use of anticoagulants: Secondary | ICD-10-CM | POA: Insufficient documentation

## 2017-09-14 DIAGNOSIS — K760 Fatty (change of) liver, not elsewhere classified: Secondary | ICD-10-CM | POA: Diagnosis not present

## 2017-09-14 DIAGNOSIS — M48061 Spinal stenosis, lumbar region without neurogenic claudication: Secondary | ICD-10-CM | POA: Insufficient documentation

## 2017-09-14 DIAGNOSIS — I480 Paroxysmal atrial fibrillation: Secondary | ICD-10-CM | POA: Insufficient documentation

## 2017-09-14 DIAGNOSIS — Z6841 Body Mass Index (BMI) 40.0 and over, adult: Secondary | ICD-10-CM | POA: Insufficient documentation

## 2017-09-14 DIAGNOSIS — E669 Obesity, unspecified: Secondary | ICD-10-CM | POA: Insufficient documentation

## 2017-09-14 DIAGNOSIS — Z9889 Other specified postprocedural states: Secondary | ICD-10-CM | POA: Diagnosis not present

## 2017-09-14 DIAGNOSIS — Z89422 Acquired absence of other left toe(s): Secondary | ICD-10-CM | POA: Diagnosis not present

## 2017-09-14 DIAGNOSIS — Z79899 Other long term (current) drug therapy: Secondary | ICD-10-CM | POA: Insufficient documentation

## 2017-09-14 DIAGNOSIS — M5136 Other intervertebral disc degeneration, lumbar region: Secondary | ICD-10-CM | POA: Diagnosis not present

## 2017-09-14 DIAGNOSIS — Z794 Long term (current) use of insulin: Secondary | ICD-10-CM | POA: Insufficient documentation

## 2017-09-14 DIAGNOSIS — M5441 Lumbago with sciatica, right side: Secondary | ICD-10-CM | POA: Insufficient documentation

## 2017-09-14 DIAGNOSIS — G8929 Other chronic pain: Secondary | ICD-10-CM | POA: Diagnosis not present

## 2017-09-14 LAB — POCT GLYCOSYLATED HEMOGLOBIN (HGB A1C): HbA1c, POC (controlled diabetic range): 10.5 % — AB (ref 0.0–7.0)

## 2017-09-14 LAB — GLUCOSE, POCT (MANUAL RESULT ENTRY): POC Glucose: 297 mg/dl — AB (ref 70–99)

## 2017-09-14 MED ORDER — LOSARTAN POTASSIUM 50 MG PO TABS: 50.0000 mg | ORAL_TABLET | Freq: Every day | ORAL | 1 refills | Status: DC

## 2017-09-14 MED ORDER — INSULIN GLARGINE 100 UNIT/ML SOLOSTAR PEN: 40.0000 [IU] | PEN_INJECTOR | Freq: Two times a day (BID) | SUBCUTANEOUS | 3 refills | Status: DC

## 2017-09-14 MED ORDER — GABAPENTIN 300 MG PO CAPS: 300.0000 mg | ORAL_CAPSULE | Freq: Every day | ORAL | 1 refills | Status: DC

## 2017-09-14 MED ORDER — ATORVASTATIN CALCIUM 20 MG PO TABS: 20.0000 mg | ORAL_TABLET | Freq: Every day | ORAL | 1 refills | Status: DC

## 2017-09-14 MED ORDER — INSULIN LISPRO 100 UNIT/ML ~~LOC~~ SOLN: 7.0000 [IU] | Freq: Three times a day (TID) | SUBCUTANEOUS | 6 refills | Status: DC

## 2017-09-14 MED ORDER — METOPROLOL TARTRATE 100 MG PO TABS: 100.0000 mg | ORAL_TABLET | Freq: Two times a day (BID) | ORAL | 1 refills | Status: DC

## 2017-09-14 NOTE — Progress Notes (Signed)
Subjective:  Patient ID: Jeffery [name], male    DOB: Aug 13, 1960  Age: 57 y.o. MRN: [id number]  CC: Diabetes   HPI Jeffery [name] is a 57 year old male with history of type 2 diabetes mellitus (A1c 10.5), hypertension, Charcot foot due to diabetes mellitus, Paroxysmal A. fib (currently on rate control with metoprolol and anticoagulation with Eliquis), status post right rotator cuff repair who presents today for follow-up visit. Completed his sessions of physical therapy and reports improvement in his symptoms. He does have chronic low back pain with radiculopathy from spinal stenosis and is being managed by [facility]. With regards to his diabetes mellitus he endorses compliance with his insulin but would like to be switched from Byetta back to Humalog as he feels he did better on the former.  Denies visual concerns, neuropathy is controlled on gabapentin.  He does have intermittent pain in his Charcot foot and would like to be referred to podiatry. Denies bruising or bleeding with Eliquis and has an upcoming appointment with cardiology.  Denies chest pains, shortness of breath. Tolerating his antihypertensive and statin and denies adverse effects from his medications. He does not exercise much and is not compliant with a diabetic diet.  Past Medical History:  Diagnosis Date  . Atrial fibrillation ([place])   . Charcot's joint of right foot   . DDD (degenerative disc disease), lumbar   . Diabetes mellitus without complication ([place])   . Fatty liver   . Hypertension   . Obesity   . Rotator cuff disorder   . Shoulder impingement, right     Past Surgical History:  Procedure Laterality Date  . AMPUTATION Left 10/02/2014   Procedure: Left Third toe amputation ;  Surgeon: [name], MD;  Location: [facility];  Service: Orthopedics;  Laterality: Left;  Regular bed, wants to follow hip  . ANTERIOR CRUCIATE LIGAMENT REPAIR Right 90   reconstruction  . APPLICATION OF WOUND VAC  Left 10/02/2014   Procedure: APPLICATION OF WOUND VAC; toe Surgeon: [name], MD;  Location: [facility];  Service: Orthopedics;  Laterality: Left;  . I&D EXTREMITY Left 10/05/2014   Procedure: IRRIGATION AND DEBRIDEMENT LEFT FOOT;  Surgeon: [name], MD;  Location: [facility];  Service: Orthopedics;  Laterality: Left;  . KNEE ARTHROSCOPY W/ ACL RECONSTRUCTION Right   . TOTAL KNEE ARTHROPLASTY Right 03/28/2015  . TOTAL KNEE ARTHROPLASTY Right 03/28/2015   Procedure: RIGHT TOTAL KNEE ARTHROPLASTY;  Surgeon: [name], MD;  Location: [facility];  Service: Orthopedics;  Laterality: Right;    No Known Allergies   Outpatient Medications Prior to Visit  Medication Sig Dispense Refill  . apixaban (ELIQUIS) 5 MG TABS tablet Take 1 tablet (5 mg total) by mouth 2 (two) times daily. 180 tablet 3  . Blood Glucose Monitoring Suppl (TRUE METRIX METER) DEVI 1 each by Does not apply route 3 (three) times daily before meals. 1 Device 0  . glucose blood (TRUE METRIX BLOOD GLUCOSE TEST) test strip Use 3 times daily before meals 100 each 12  . Insulin Pen Needle (B-D ULTRAFINE III SHORT PEN) 31G X 8 MM MISC 1 each by Does not apply route 3 (three) times daily. 100 each 5  . Insulin Syringe-Needle U-100 (TRUEPLUS INSULIN SYRINGE) 30G X 5/16" 0.5 ML MISC Use as directed 3 times daily 100 each 5  . lidocaine (LIDODERM) 5 % Place 1 patch daily onto the skin. Remove & Discard patch within 12 hours or as directed by  MD 30 patch 3  . oxyCODONE-acetaminophen (PERCOCET) 5-325 MG tablet 1-2 tabs po bid prn pain 30 tablet 0  . sodium chloride (OCEAN) 0.65 % SOLN nasal spray Place 1 spray into both nostrils 2 (two) times daily as needed for congestion.    Marland Kitchen tiZANidine (ZANAFLEX) 4 MG tablet Take 1 tablet (4 mg total) by mouth every 6 (six) hours as needed for muscle spasms. 30 tablet 2  . TRUEPLUS INSULIN SYRINGE 30G X 5/16" 0.5 ML MISC USE AS DIRECTED 3 TIMES DAILY 100 each 0  . TRUEPLUS LANCETS 28G MISC 1 each by Does not apply  route 3 (three) times daily before meals. 100 each 12  . atorvastatin (LIPITOR) 20 MG tablet TAKE ONE TABLET BY MOUTH DAILY 30 tablet 1  . exenatide (BYETTA 10 MCG PEN) 10 MCG/0.04ML SOPN injection Inject 0.04 mLs (10 mcg total) 2 (two) times daily with a meal into the skin. 1 pen 5  . gabapentin (NEURONTIN) 300 MG capsule TAKE 2 CAPSULES BY MOUTH AT BEDTIME WHEN NECESSARY FOE NEUROPATHY PAIN. 60 capsule 1  . Insulin Glargine (LANTUS SOLOSTAR) 100 UNIT/ML Solostar Pen INJECT 34 (0.34MLS) UNITS INTO THE SKIN IN THE MORNING AND AT BEDTIME 5 pen 0  . losartan (COZAAR) 50 MG tablet Take 1 tablet (50 mg total) by mouth daily. 30 tablet 5  . metoprolol tartrate (LOPRESSOR) 100 MG tablet Take 1 tablet (100 mg total) by mouth 2 (two) times daily. 180 tablet 3  . oxyCODONE-acetaminophen (PERCOCET/ROXICET) 5-325 MG tablet Take 1 tablet by mouth every 4 (four) hours as needed for moderate pain. Takes one tablet in the morning, 1 tablet at noon, and 1 tablet at night.      No facility-administered medications prior to visit.     ROS Review of Systems  Constitutional: Negative for activity change and appetite change.  HENT: Negative for sinus pressure and sore throat.   Eyes: Negative for visual disturbance.  Respiratory: Negative for cough, chest tightness and shortness of breath.   Cardiovascular: Negative for chest pain and leg swelling.  Gastrointestinal: Negative for abdominal distention, abdominal pain, constipation and diarrhea.  Endocrine: Negative.   Genitourinary: Negative for dysuria.  Musculoskeletal: Negative for joint swelling and myalgias.  Skin: Negative for rash.  Allergic/Immunologic: Negative.   Neurological: Negative for weakness, light-headedness and numbness.  Psychiatric/Behavioral: Negative for dysphoric mood and suicidal ideas.    Objective:  BP 131/77   Pulse 78   Temp 98 F (36.7 C) (Oral)   Ht 6\' 3"  (1.905 m)   Wt (!) 322 lb (146.1 kg)   SpO2 98%   BMI 40.25 kg/m     BP/Weight 09/14/2017 06/10/2017 30/16/0109  Systolic BP 323 557 322  Diastolic BP 77 84 81  Wt. (Lbs) 322 316.4 312  BMI 40.25 39.55 39      Physical Exam  Constitutional: He is oriented to person, place, and time. He appears well-developed and well-nourished.  Cardiovascular: Normal rate, normal heart sounds and intact distal pulses.  No murmur heard. Pulmonary/Chest: Effort normal and breath sounds normal. He has no wheezes. He has no rales. He exhibits no tenderness.  Abdominal: Soft. Bowel sounds are normal. He exhibits no distension and no mass. There is no tenderness.  Musculoskeletal: Normal range of motion.  Neurological: He is alert and oriented to person, place, and time.  Skin: Skin is warm and dry.  Psychiatric: He has a normal mood and affect.     CMP Latest Ref Rng & Units 04/15/2017  03/26/2017 11/04/2016  Glucose mg/dL CANCELED 107(H) 152(H)  BUN - CANCELED 12 12  Creatinine - CANCELED 0.73 0.80  Sodium - CANCELED 137 140  Potassium - CANCELED 4.7 5.1  Chloride - CANCELED 104 103  CO2 - CANCELED 23 22  Calcium - CANCELED 9.0 9.6  Total Protein - CANCELED 7.1 7.2  Total Bilirubin - CANCELED 0.8 0.4  Alkaline Phos - CANCELED 73 79  AST - CANCELED 28 15  ALT - CANCELED 23 22    Lipid Panel     Component Value Date/Time   CHOL CANCELED 04/15/2017 1022   TRIG CANCELED 04/15/2017 1022   HDL CANCELED 04/15/2017 1022   CHOLHDL 4.5 11/04/2016 0833   CHOLHDL 3.8 04/10/2010 0315   VLDL 32 04/10/2010 0315   LDLCALC 108 (H) 11/04/2016 0833    Lab Results  Component Value Date   HGBA1C 10.5 (A) 09/14/2017        Assessment & Plan:   1. Uncontrolled type 2 diabetes mellitus with complication, with long-term current use of insulin ([place]) Uncontrolled with A1c of 10.5 Increased dose of Lantus Switched from Byetta to Humalog as per patient request Counseled on blood pressure goal of less than 130/80, low-sodium, DASH diet, medication compliance, 150 minutes  of moderate intensity exercise per week. Discussed medication compliance, adverse effects. - POCT glucose (manual entry) - POCT glycosylated hemoglobin (Hb A1C) - Insulin Glargine (LANTUS SOLOSTAR) 100 UNIT/ML Solostar Pen; Inject 40 Units into the skin 2 (two) times daily. I  Dispense: 5 pen; Refill: 3 - atorvastatin (LIPITOR) 20 MG tablet; Take 1 tablet (20 mg total) by mouth daily.  Dispense: 90 tablet; Refill: 1 - Ambulatory referral to Podiatry  2. S/P right rotator cuff repair Completed physical therapy session Improved  3. Chronic midline low back pain with bilateral sciatica Secondary to spinal stenosis Stable Followed by pain management - gabapentin (NEURONTIN) 300 MG capsule; Take 1 capsule (300 mg total) by mouth at bedtime.  Dispense: 90 capsule; Refill: 1  4. Essential hypertension Controlled Counseled on blood pressure goal of less than 130/80, low-sodium, DASH diet, medication compliance, 150 minutes of moderate intensity exercise per week. Discussed medication compliance, adverse effects. - losartan (COZAAR) 50 MG tablet; Take 1 tablet (50 mg total) by mouth daily.  Dispense: 90 tablet; Refill: 1 - metoprolol tartrate (LOPRESSOR) 100 MG tablet; Take 1 tablet (100 mg total) by mouth 2 (two) times daily.  Dispense: 180 tablet; Refill: 1  5. Charcot foot due to diabetes mellitus ([place]) Stable - Ambulatory referral to Podiatry  6.  Paroxysmal A. fib Currently on Eliquis for anticoagulation and rate control with metoprolol  Meds ordered this encounter  Medications  . Insulin Glargine (LANTUS SOLOSTAR) 100 UNIT/ML Solostar Pen    Sig: Inject 40 Units into the skin 2 (two) times daily. I    Dispense:  5 pen    Refill:  3  . atorvastatin (LIPITOR) 20 MG tablet    Sig: Take 1 tablet (20 mg total) by mouth daily.    Dispense:  90 tablet    Refill:  1  . gabapentin (NEURONTIN) 300 MG capsule    Sig: Take 1 capsule (300 mg total) by mouth at bedtime.    Dispense:  90  capsule    Refill:  1  . losartan (COZAAR) 50 MG tablet    Sig: Take 1 tablet (50 mg total) by mouth daily.    Dispense:  90 tablet    Refill:  1  . metoprolol  tartrate (LOPRESSOR) 100 MG tablet    Sig: Take 1 tablet (100 mg total) by mouth 2 (two) times daily.    Dispense:  180 tablet    Refill:  1  . insulin lispro (HUMALOG) 100 UNIT/ML injection    Sig: Inject 0.07 mLs (7 Units total) into the skin 3 (three) times daily before meals.    Dispense:  30 mL    Refill:  6    Discontinue Byetta    Follow-up: Return in about 3 months (around 12/15/2017) for follow up of chronic medical conditions.   [name] MD

## 2017-09-21 ENCOUNTER — Telehealth: Payer: Self-pay | Admitting: Cardiovascular Disease

## 2017-09-21 ENCOUNTER — Telehealth (HOSPITAL_COMMUNITY): Payer: Self-pay | Admitting: *Deleted

## 2017-09-21 NOTE — Telephone Encounter (Signed)
LMTCB

## 2017-09-21 NOTE — Telephone Encounter (Signed)
Patient is having spinal injection on Friday 6/28 and needs to know how long to hold eliquis prior

## 2017-09-21 NOTE — Telephone Encounter (Signed)
New message    Pt is calling asking for a call back about his medication. Please call.

## 2017-09-21 NOTE — Telephone Encounter (Signed)
Pt had left voicemail for afib clinic in regards to back injection and holding eliquis - appears pt had called [facility] office and pharmacy is taking care of response. Pt should continue metoprolol without holding.

## 2017-09-21 NOTE — Telephone Encounter (Signed)
Patient with diagnosis of Atrial fibrillation on Eliquis for anticoagulation.    Procedure: Spinal injection Date of procedure: 09/24/17  CHADS2-VASc score of  2 (HTN,  DM2)  Scr = 0.73  Per office protocol, patient can hold ELIQUIS for 3 days prior to procedure.     Lisvet Rasheed Rodriguez-Guzman PharmD, BCPS, [place] [id number] 09/21/2017 4:18 PM   *Patient received instructions today*

## 2017-10-06 ENCOUNTER — Other Ambulatory Visit: Payer: Self-pay

## 2017-10-06 DIAGNOSIS — IMO0002 Reserved for concepts with insufficient information to code with codable children: Secondary | ICD-10-CM

## 2017-10-06 DIAGNOSIS — Z794 Long term (current) use of insulin: Principal | ICD-10-CM

## 2017-10-06 DIAGNOSIS — E118 Type 2 diabetes mellitus with unspecified complications: Secondary | ICD-10-CM

## 2017-10-06 DIAGNOSIS — E1165 Type 2 diabetes mellitus with hyperglycemia: Secondary | ICD-10-CM

## 2017-10-06 MED ORDER — INSULIN PEN NEEDLE 31G X 8 MM MISC: 1.0000 | Freq: Three times a day (TID) | 5 refills | Status: DC

## 2017-10-06 MED ORDER — "INSULIN SYRINGE-NEEDLE U-100 30G X 5/16"" 0.5 ML MISC": 5 refills | Status: AC

## 2017-10-08 DIAGNOSIS — M542 Cervicalgia: Secondary | ICD-10-CM | POA: Diagnosis not present

## 2017-10-08 DIAGNOSIS — M25511 Pain in right shoulder: Secondary | ICD-10-CM | POA: Diagnosis not present

## 2017-10-08 DIAGNOSIS — M545 Low back pain: Secondary | ICD-10-CM | POA: Diagnosis not present

## 2017-10-08 DIAGNOSIS — G894 Chronic pain syndrome: Secondary | ICD-10-CM | POA: Diagnosis not present

## 2017-10-12 ENCOUNTER — Other Ambulatory Visit: Payer: Self-pay

## 2017-10-12 DIAGNOSIS — Z794 Long term (current) use of insulin: Principal | ICD-10-CM

## 2017-10-12 DIAGNOSIS — E118 Type 2 diabetes mellitus with unspecified complications: Secondary | ICD-10-CM

## 2017-10-12 DIAGNOSIS — E1165 Type 2 diabetes mellitus with hyperglycemia: Secondary | ICD-10-CM

## 2017-10-12 DIAGNOSIS — IMO0002 Reserved for concepts with insufficient information to code with codable children: Secondary | ICD-10-CM

## 2017-10-12 MED ORDER — INSULIN PEN NEEDLE 31G X 8 MM MISC: 1.0000 | Freq: Three times a day (TID) | 5 refills | Status: AC

## 2017-10-14 ENCOUNTER — Ambulatory Visit: Payer: Medicaid Other | Admitting: Podiatry

## 2017-10-21 ENCOUNTER — Ambulatory Visit: Payer: Medicaid Other | Admitting: Podiatry

## 2017-10-21 ENCOUNTER — Encounter: Payer: Self-pay | Admitting: Podiatry

## 2017-10-21 ENCOUNTER — Ambulatory Visit (INDEPENDENT_AMBULATORY_CARE_PROVIDER_SITE_OTHER): Payer: Medicaid Other

## 2017-10-21 DIAGNOSIS — M76821 Posterior tibial tendinitis, right leg: Secondary | ICD-10-CM

## 2017-10-21 DIAGNOSIS — M76822 Posterior tibial tendinitis, left leg: Secondary | ICD-10-CM

## 2017-10-21 DIAGNOSIS — E11610 Type 2 diabetes mellitus with diabetic neuropathic arthropathy: Secondary | ICD-10-CM | POA: Diagnosis not present

## 2017-10-21 NOTE — Progress Notes (Signed)
Subjective:   Patient ID: Jeffery [name], male   DOB: 57 y.o.   MRN: [id number]   HPI Patient states he was diagnosed with Charcot foot right over the last year and has had a lot of pain in the inside of the ankle and he has lost the fourth toe on the left foot secondary to previous osteomyelitis with long-term history of diabetes not in good control with obesity is complicating factor   Review of Systems  All other systems reviewed and are negative.       Objective:  Physical Exam  Constitutional: He appears well-developed and well-nourished.  Cardiovascular: Intact distal pulses.  Pulmonary/Chest: Effort normal.  Musculoskeletal: Normal range of motion.  Neurological: He is alert.  Skin: Skin is warm.  Nursing note and vitals reviewed.   Vascular status found to be intact with patient noted to have quite a bit of collapse of medial longitudinal arch right over left with inflammation of the posterior tibial tendon group right over left with pain and deformity.  Patient does have loss of the third toe left foot with a irritation of the second toe but there is no drainage or redness or swelling noted and patient has good digital perfusion     Assessment:  Poor health individual with Charcot foot deformity right which appears to have coalesced and history of amputation left with poor control of diabetes     Plan:  H&P condition reviewed at great length diabetic education rendered and discussed the importance of control and weight loss.  Today I went ahead and I recommended either a AFO brace or a Crow walker for the right to try to provide for some kind of support and prevent further worsening of condition and we will either see him here or he will be referred to the lab depending on his insurance  X-ray indicates that there is severe collapse of medial longitudinal arch right over left with multiple signs of posterior tibial tendon dysfunction Charcot foot deformity

## 2017-10-24 ENCOUNTER — Emergency Department (HOSPITAL_COMMUNITY): Payer: Medicaid Other

## 2017-10-24 ENCOUNTER — Emergency Department (HOSPITAL_COMMUNITY)
Admission: EM | Admit: 2017-10-24 | Discharge: 2017-10-24 | Disposition: A | Discharge status: Home / Self Care | Payer: Medicaid Other | Attending: Emergency Medicine | Admitting: Emergency Medicine

## 2017-10-24 ENCOUNTER — Other Ambulatory Visit: Payer: Self-pay

## 2017-10-24 ENCOUNTER — Encounter (HOSPITAL_COMMUNITY): Payer: Self-pay | Admitting: Emergency Medicine

## 2017-10-24 DIAGNOSIS — Z79899 Other long term (current) drug therapy: Secondary | ICD-10-CM | POA: Diagnosis not present

## 2017-10-24 DIAGNOSIS — E119 Type 2 diabetes mellitus without complications: Secondary | ICD-10-CM | POA: Insufficient documentation

## 2017-10-24 DIAGNOSIS — Z794 Long term (current) use of insulin: Secondary | ICD-10-CM | POA: Insufficient documentation

## 2017-10-24 DIAGNOSIS — Z7901 Long term (current) use of anticoagulants: Secondary | ICD-10-CM | POA: Diagnosis not present

## 2017-10-24 DIAGNOSIS — R0600 Dyspnea, unspecified: Secondary | ICD-10-CM

## 2017-10-24 DIAGNOSIS — R0602 Shortness of breath: Secondary | ICD-10-CM

## 2017-10-24 DIAGNOSIS — I4891 Unspecified atrial fibrillation: Secondary | ICD-10-CM | POA: Diagnosis not present

## 2017-10-24 DIAGNOSIS — I10 Essential (primary) hypertension: Secondary | ICD-10-CM | POA: Insufficient documentation

## 2017-10-24 DIAGNOSIS — I480 Paroxysmal atrial fibrillation: Secondary | ICD-10-CM | POA: Diagnosis not present

## 2017-10-24 DIAGNOSIS — Z87891 Personal history of nicotine dependence: Secondary | ICD-10-CM | POA: Insufficient documentation

## 2017-10-24 LAB — CBC
HCT: 47.6 % (ref 39.0–52.0)
Hemoglobin: 15.3 g/dL (ref 13.0–17.0)
MCH: 27.9 pg (ref 26.0–34.0)
MCHC: 32.1 g/dL (ref 30.0–36.0)
MCV: 86.9 fL (ref 78.0–100.0)
Platelets: 195 10*3/uL (ref 150–400)
RBC: 5.48 MIL/uL (ref 4.22–5.81)
RDW: 12.6 % (ref 11.5–15.5)
WBC: 9 10*3/uL (ref 4.0–10.5)

## 2017-10-24 LAB — I-STAT TROPONIN, ED
Troponin i, poc: 0.01 ng/mL (ref 0.00–0.08)
Troponin i, poc: 0.01 ng/mL (ref 0.00–0.08)

## 2017-10-24 LAB — BASIC METABOLIC PANEL
Anion gap: 8 (ref 5–15)
BUN: 15 mg/dL (ref 6–20)
CO2: 25 mmol/L (ref 22–32)
Calcium: 9.4 mg/dL (ref 8.9–10.3)
Chloride: 101 mmol/L (ref 98–111)
Creatinine, Ser: 1.13 mg/dL (ref 0.61–1.24)
GFR calc Af Amer: >60 mL/min (ref >60)
GFR calc non Af Amer: >60 mL/min (ref >60)
Glucose, Bld: 277 mg/dL — ABNORMAL HIGH (ref 70–99)
Potassium: 4.6 mmol/L (ref 3.5–5.1)
Sodium: 134 mmol/L — ABNORMAL LOW (ref 135–145)

## 2017-10-24 NOTE — ED Triage Notes (Signed)
Pt reports SOB X few days, pt states he is SOB at rest and with exertion. Denies CP. Hx afib.

## 2017-10-24 NOTE — Progress Notes (Signed)
Cardiology moonlighter note  Asked by ED provider to help facilitate scheduling outpatient appointment for this patient, who presents to the ED today with shortness of breath.  His work-up in the ED today has been largely unrevealing.  His cardiac troponins have been negative x2.  His ECG reveals no atrial fibrillation or other arrhythmia.  His chest x-ray is normal.  His labs are normal.  Given his history and cardiovascular risk factors however, the patient warrants follow-up in the outpatient setting.  He has been seen by Dr. Claiborne Billings in the past.  A copy of this note will be forwarded to Dr. Claiborne Billings for consideration of outpatient follow-up.  Marcie Mowers, MD Cardiology Fellow, PGY-6

## 2017-10-24 NOTE — ED Notes (Signed)
Report given to oncoming RN.

## 2017-10-24 NOTE — ED Provider Notes (Signed)
[facility] [place] EMERGENCY DEPARTMENT Provider Note   CSN: [id number] Arrival date & time: 10/24/17  1628     History   Chief Complaint Chief Complaint  Patient presents with  . Shortness of Breath    HPI Jeffery [name] is a 57 y.o. male.  The history is provided by the patient. No language interpreter was used.  Shortness of Breath  This is a new problem. The current episode started more than 2 days ago. The problem has not changed since onset.Pertinent negatives include no fever, no cough, no chest pain, no syncope and no vomiting. He has tried nothing for the symptoms. The treatment provided no relief. He has had prior hospitalizations.  Pt reports he has a history of atrial fibrillation.  He is follow ed by Dr. Claiborne Billings.  Pt reports he has been feeling short of breath with exertion for several days.   Pt reports he has the same feeling that he has had with episodes of atrial fibrillation.    Past Medical History:  Diagnosis Date  . Atrial fibrillation ([place])   . Charcot's joint of right foot   . DDD (degenerative disc disease), lumbar   . Diabetes mellitus without complication ([place])   . Fatty liver   . Hypertension   . Obesity   . Rotator cuff disorder   . Shoulder impingement, right     Patient Active Problem List   Diagnosis Date Noted  . S/P right rotator cuff repair 04/05/2017  . Diabetes mellitus without complication ([place]) 08/65/7846  . Achilles tendon contracture, right 01/05/2017  . Closed nondisplaced fracture of distal phalanx of right great toe 01/05/2017  . Paroxysmal atrial fibrillation ([place]) 10/30/2016  . Pain in right ankle and joints of right foot 07/09/2016  . Diabetic polyneuropathy associated with type 2 diabetes mellitus ([place]) 07/09/2016  . 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 Impingement syndrome of right shoulder 07/07/2016  . Pain in right foot 07/07/2016  . Charcot's  joint of right foot 07/03/2016  . Morbid obesity ([place]) 06/05/2016  . S/P total knee replacement using cement 03/28/2015  . Knee osteoarthritis 11/12/2014  . Hypertension 10/15/2014  . Osteomyelitis of toe of left foot ([place])   . Toe osteomyelitis, left ([place]) 10/01/2014  . Charcot foot due to diabetes mellitus ([place]) 10/01/2014  . Accelerated hypertension 10/01/2014  . Gangrene left third toe 10/01/2014  . SHOULDER JOINT INSTABILITY 05/27/2010  . SHOULDER PAIN, RIGHT 05/27/2010    Past Surgical History:  Procedure Laterality Date  . AMPUTATION Left 10/02/2014   Procedure: Left Third toe amputation ;  Surgeon: [name], MD;  Location: [facility];  Service: Orthopedics;  Laterality: Left;  Regular bed, wants to follow hip  . ANTERIOR CRUCIATE LIGAMENT REPAIR Right 90   reconstruction  . APPLICATION OF WOUND VAC Left 10/02/2014   Procedure: APPLICATION OF WOUND VAC; toe Surgeon: [name], MD;  Location: [facility];  Service: Orthopedics;  Laterality: Left;  . I&D EXTREMITY Left 10/05/2014   Procedure: IRRIGATION AND DEBRIDEMENT LEFT FOOT;  Surgeon: [name], MD;  Location: [facility];  Service: Orthopedics;  Laterality: Left;  . KNEE ARTHROSCOPY W/ ACL RECONSTRUCTION Right   . TOTAL KNEE ARTHROPLASTY Right 03/28/2015  . TOTAL KNEE ARTHROPLASTY Right 03/28/2015   Procedure: RIGHT TOTAL KNEE ARTHROPLASTY;  Surgeon: [name], MD;  Location: [facility];  Service: Orthopedics;  Laterality: Right;        Home Medications  Prior to Admission medications   Medication Sig Start Date End Date Taking? Authorizing Provider  atorvastatin (LIPITOR) 20 MG tablet Take 1 tablet (20 mg total) by mouth daily. 09/14/17  Yes [name], MD  gabapentin (NEURONTIN) 300 MG capsule Take 1 capsule (300 mg total) by mouth at bedtime. Patient taking differently: Take 300 mg by mouth at bedtime as needed (foot tingling).  09/14/17  Yes [name], MD  Insulin Glargine (LANTUS SOLOSTAR) 100 UNIT/ML Solostar Pen  Inject 40 Units into the skin 2 (two) times daily. I 09/14/17  Yes Newlin, Charlane Ferretti, MD  insulin lispro (HUMALOG) 100 UNIT/ML injection Inject 0.07 mLs (7 Units total) into the skin 3 (three) times daily before meals. 09/14/17  Yes [name], MD  losartan (COZAAR) 50 MG tablet Take 1 tablet (50 mg total) by mouth daily. 09/14/17  Yes [name], MD  metoprolol tartrate (LOPRESSOR) 100 MG tablet Take 1 tablet (100 mg total) by mouth 2 (two) times daily. 09/14/17  Yes [name], MD  oxyCODONE-acetaminophen (PERCOCET) 5-325 MG tablet 1-2 tabs po bid prn pain 04/05/17  Yes Dwana Melena L, PA-C  sodium chloride (OCEAN) 0.65 % SOLN nasal spray Place 1 spray into both nostrils 2 (two) times daily as needed for congestion.   Yes [provider]  tiZANidine (ZANAFLEX) 4 MG tablet Take 1 tablet (4 mg total) by mouth every 6 (six) hours as needed for muscle spasms. 03/26/17  Yes [name], MD  apixaban (ELIQUIS) 5 MG TABS tablet Take 1 tablet (5 mg total) by mouth 2 (two) times daily. 01/04/17   [name], MD  Blood Glucose Monitoring Suppl (TRUE METRIX METER) DEVI 1 each by Does not apply route 3 (three) times daily before meals. 06/19/16   [name], MD  glucose blood (TRUE METRIX BLOOD GLUCOSE TEST) test strip Use 3 times daily before meals 06/19/16   Newlin, Charlane Ferretti, MD  Insulin Pen Needle (B-D ULTRAFINE III SHORT PEN) 31G X 8 MM MISC 1 each by Does not apply route 3 (three) times daily. 10/12/17   [name], MD  Insulin Syringe-Needle U-100 (TRUEPLUS INSULIN SYRINGE) 30G X 5/16" 0.5 ML MISC Use as directed 3 times daily 10/06/17   [name], MD  lidocaine (LIDODERM) 5 % Place 1 patch daily onto the skin. Remove & Discard patch within 12 hours or as directed by MD Patient not taking: Reported on 10/24/2017 02/15/17   [name], MD  TRUEPLUS INSULIN SYRINGE 30G X 5/16" 0.5 ML MISC USE AS DIRECTED 3 TIMES DAILY 04/03/16   Tresa Garter, MD  TRUEPLUS LANCETS  28G MISC 1 each by Does not apply route 3 (three) times daily before meals. 06/19/16   [name], MD    Family History Family History  Problem Relation Age of Onset  . Diabetes Father   . Hypertension Father   . Heart failure Father     Social History Social History   Tobacco Use  . Smoking status: Former Smoker    Packs/day: 0.00    Years: 38.00    Pack years: 0.00    Types: Cigarettes  . Smokeless tobacco: Never Used  Substance Use Topics  . Alcohol use: No    Alcohol/week: 3.0 - 3.6 oz    Types: 5 - 6 Cans of beer per week  . Drug use: Yes    Types: Marijuana    Comment: last week ; denies 03/24/17     Allergies   Patient has no known allergies.  Review of Systems Review of Systems  Constitutional: Negative for fever.  Respiratory: Positive for shortness of breath. Negative for cough.   Cardiovascular: Negative for chest pain and syncope.  Gastrointestinal: Negative for vomiting.  All other systems reviewed and are negative.    Physical Exam Updated Vital Signs BP (!) 143/83   Pulse 69   Temp 98 F (36.7 C)   Resp 16   Ht 6\' 3"  (1.905 m)   Wt (!) 142.9 kg (315 lb)   SpO2 98%   BMI 39.37 kg/m   Physical Exam  Constitutional: He appears well-developed and well-nourished.  HENT:  Head: Normocephalic and atraumatic.  Mouth/Throat: Oropharynx is clear and moist.  Eyes: Conjunctivae are normal.  Neck: Normal range of motion. Neck supple.  Cardiovascular: Normal rate and regular rhythm.  No murmur heard. Pulmonary/Chest: Effort normal and breath sounds normal. No respiratory distress. He has no decreased breath sounds.  Abdominal: Soft. There is no tenderness.  Musculoskeletal: He exhibits no edema.  Neurological: He is alert.  Skin: Skin is warm and dry.  Psychiatric: He has a normal mood and affect.  Nursing note and vitals reviewed.    ED Treatments / Results  Labs (all labs ordered are listed, but only abnormal results are  displayed) Labs Reviewed  BASIC METABOLIC PANEL - Abnormal; Notable for the following components:      Result Value   Sodium 134 (*)    Glucose, Bld 277 (*)    All other components within normal limits  CBC  I-STAT TROPONIN, ED  I-STAT TROPONIN, ED    EKG None  Radiology Dg Chest 2 View  Result Date: 10/24/2017 CLINICAL DATA:  Acute shortness of breath for several days. EXAM: CHEST - 2 VIEW COMPARISON:  08/04/2016 and prior radiographs FINDINGS: The cardiomediastinal silhouette is unremarkable. There is no evidence of focal airspace disease, pulmonary edema, suspicious pulmonary nodule/mass, pleural effusion, or pneumothorax. No acute bony abnormalities are identified. Remote RIGHT clavicle, rib and midthoracic fractures again noted. IMPRESSION: No active cardiopulmonary disease. Electronically Signed   By: Margarette Canada M.D.   On: 10/24/2017 17:37    Procedures Procedures (including critical care time)  Medications Ordered in ED Medications - No data to display   Initial Impression / Assessment and Plan / ED Course  I have reviewed the triage vital signs and the nursing notes.  Pertinent labs & imaging results that were available during my care of the patient were reviewed by me and considered in my medical decision making (see chart for details).     MDM  EKg no acute abnormality,  Pt is not in Afib.  Troponin is negative x 2.  Chest xray is normal.  Pt is pain free.  He has not had chest pain with shortness of breath.  I counseled pt on labs and xrays.  Pt advised he could still have coronary artery disease given his risk factors.  Pt is advised he needs to return if symptoms worsen.  Pt has not had a stress test in the past.  I spoke with cardiology fellow who will let Dr. Claiborne Billings know pt needs follow up.   Pt is advised to call Dr. Evette Georges office to schedule appointment.      Final Clinical Impressions(s) / ED Diagnoses   Final diagnoses:  SOB (shortness of breath)    Dyspnea, unspecified type    ED Discharge Orders    None    An After Visit Summary was printed and given to  the patient.    Sidney Ace 10/24/17 2238    Julianne Rice, MD 10/27/17 1410

## 2017-10-24 NOTE — Discharge Instructions (Addendum)
Call Dr. Claiborne Billings to schedule appointment for follow up.  Return if shortness of breath increases.

## 2017-10-24 NOTE — ED Notes (Signed)
Onset of interm dyspnea x3 days becoming progressively worse - describes as "something cutting off my air"; o2 sats currently 100% RA, able to speak in complete sentences; resp even, nonlabored

## 2017-10-25 ENCOUNTER — Telehealth: Payer: Self-pay | Admitting: Cardiovascular Disease

## 2017-10-25 DIAGNOSIS — M47816 Spondylosis without myelopathy or radiculopathy, lumbar region: Secondary | ICD-10-CM | POA: Diagnosis not present

## 2017-10-25 NOTE — Telephone Encounter (Addendum)
New message     Patient calling with concerns of SOB. Seem in the ED on 7/28 Patient requesting order for stress test   1. Are you currently SOB (can you hear that pt is SOB on the phone)?  No  2. How long have you been experiencing SOB? 3 DAYS  3. Are you SOB when sitting or when up moving around? Sitting, laying, and moving  around  4. Are you currently experiencing any other symptoms? No

## 2017-10-25 NOTE — Telephone Encounter (Signed)
Spoke with pt who states he has been experiencing periods of SOB at rest and with exertion. He states yesterday it lasted for a few hours and he felt like who couldn't catch his breath so he went to the ED. He reports symptoms are similar to when he is in AFIB. Per ED report, pt was not in afib, troponin and chest xray was negative and advised to f/u with cardiologist. Appointment scheduled for 7/30 at 2 pm with Kerin Ransom, PA

## 2017-10-26 ENCOUNTER — Encounter: Payer: Self-pay | Admitting: Cardiology

## 2017-10-26 ENCOUNTER — Ambulatory Visit: Payer: Medicaid Other | Admitting: Cardiology

## 2017-10-26 VITALS — BP 124/76 | HR 74 | Ht 75.0 in | Wt 320.0 lb

## 2017-10-26 DIAGNOSIS — R0609 Other forms of dyspnea: Secondary | ICD-10-CM | POA: Diagnosis not present

## 2017-10-26 DIAGNOSIS — R0600 Dyspnea, unspecified: Secondary | ICD-10-CM | POA: Insufficient documentation

## 2017-10-26 DIAGNOSIS — E1142 Type 2 diabetes mellitus with diabetic polyneuropathy: Secondary | ICD-10-CM | POA: Diagnosis not present

## 2017-10-26 DIAGNOSIS — R0602 Shortness of breath: Secondary | ICD-10-CM

## 2017-10-26 DIAGNOSIS — I480 Paroxysmal atrial fibrillation: Secondary | ICD-10-CM

## 2017-10-26 DIAGNOSIS — Z7901 Long term (current) use of anticoagulants: Secondary | ICD-10-CM | POA: Insufficient documentation

## 2017-10-26 NOTE — Assessment & Plan Note (Signed)
Possible anginal equivalent.

## 2017-10-26 NOTE — Assessment & Plan Note (Signed)
He was not in AF when he went to the ED 10/24/16 and is not in AF in the office today.  His usual presenting symptom when in AF is dyspnea.

## 2017-10-26 NOTE — Assessment & Plan Note (Signed)
Eliquis- CHADS VASC=2 

## 2017-10-26 NOTE — Progress Notes (Signed)
10/26/2017 [name]   1961/03/17  [id number]  Primary Physician [name], MD Primary Cardiologist: Dr Claiborne Billings  HPI:  57 y/o morbidly obese male followed by Dr Claiborne Billings with a history of PAF. He usually presents with dyspnea, he does not notice palpitations or tachycardia.  He has IDDM with Charcot foot and neuropathy. He has had prior Lt toe amputation for gangrene. He has spinal stenosis and goes to the pain clinic. Recently he went to the ED with complaints of dyspnea at rest. He thought he was back in AF. In the ED he was in NSR.  Troponin were negative x 2. He denies any chest pain or exertional symptoms but he admits his activity is markedly limited secondary to his medical problems. He admitted to me a close friend just had CABG- same age as he is. Dr Claiborne Billings also followed Mr Biggar father. The pt was concerned he had a heart problem "or a blockage".    Current Outpatient Medications  Medication Sig Dispense Refill  . apixaban (ELIQUIS) 5 MG TABS tablet Take 1 tablet (5 mg total) by mouth 2 (two) times daily. 180 tablet 3  . atorvastatin (LIPITOR) 20 MG tablet Take 1 tablet (20 mg total) by mouth daily. 90 tablet 1  . Blood Glucose Monitoring Suppl (TRUE METRIX METER) DEVI 1 each by Does not apply route 3 (three) times daily before meals. 1 Device 0  . gabapentin (NEURONTIN) 300 MG capsule Take 1 capsule (300 mg total) by mouth at bedtime. (Patient taking differently: Take 300 mg by mouth at bedtime as needed (foot tingling). ) 90 capsule 1  . glucose blood (TRUE METRIX BLOOD GLUCOSE TEST) test strip Use 3 times daily before meals 100 each 12  . Insulin Glargine (LANTUS SOLOSTAR) 100 UNIT/ML Solostar Pen Inject 40 Units into the skin 2 (two) times daily. I 5 pen 3  . insulin lispro (HUMALOG) 100 UNIT/ML injection Inject 0.07 mLs (7 Units total) into the skin 3 (three) times daily before meals. 30 mL 6  . Insulin Pen Needle (B-D ULTRAFINE III SHORT PEN) 31G X 8 MM MISC 1 each  by Does not apply route 3 (three) times daily. 100 each 5  . Insulin Syringe-Needle U-100 (TRUEPLUS INSULIN SYRINGE) 30G X 5/16" 0.5 ML MISC Use as directed 3 times daily 100 each 5  . losartan (COZAAR) 50 MG tablet Take 1 tablet (50 mg total) by mouth daily. 90 tablet 1  . metoprolol tartrate (LOPRESSOR) 100 MG tablet Take 1 tablet (100 mg total) by mouth 2 (two) times daily. 180 tablet 1  . oxyCODONE-acetaminophen (PERCOCET) 5-325 MG tablet 1-2 tabs po bid prn pain 30 tablet 0  . sodium chloride (OCEAN) 0.65 % SOLN nasal spray Place 1 spray into both nostrils 2 (two) times daily as needed for congestion.    Marland Kitchen tiZANidine (ZANAFLEX) 4 MG tablet Take 1 tablet (4 mg total) by mouth every 6 (six) hours as needed for muscle spasms. 30 tablet 2  . TRUEPLUS INSULIN SYRINGE 30G X 5/16" 0.5 ML MISC USE AS DIRECTED 3 TIMES DAILY 100 each 0  . TRUEPLUS LANCETS 28G MISC 1 each by Does not apply route 3 (three) times daily before meals. 100 each 12   No current facility-administered medications for this visit.     No Known Allergies  Past Medical History:  Diagnosis Date  . Atrial fibrillation ([place])   . Charcot's joint of right foot   . DDD (degenerative disc disease), lumbar   .  Diabetes mellitus without complication ([place])   . Fatty liver   . Hypertension   . Obesity   . Rotator cuff disorder   . Shoulder impingement, right     Social History   Socioeconomic History  . Marital status: Single    Spouse name: Not on file  . Number of children: Not on file  . Years of education: Not on file  . Highest education level: Not on file  Occupational History  . Occupation: Management consultant  Social Needs  . Financial resource strain: Not on file  . Food insecurity:    Worry: Not on file    Inability: Not on file  . Transportation needs:    Medical: Not on file    Non-medical: Not on file  Tobacco Use  . Smoking status: Former Smoker    Packs/day: 0.00    Years: 38.00    Pack  years: 0.00    Types: Cigarettes  . Smokeless tobacco: Never Used  Substance and Sexual Activity  . Alcohol use: No    Alcohol/week: 3.0 - 3.6 oz    Types: 5 - 6 Cans of beer per week  . Drug use: Yes    Types: Marijuana    Comment: last week ; denies 03/24/17  . Sexual activity: Not on file  Lifestyle  . Physical activity:    Days per week: Not on file    Minutes per session: Not on file  . Stress: Not on file  Relationships  . Social connections:    Talks on phone: Not on file    Gets together: Not on file    Attends religious service: Not on file    Active member of club or organization: Not on file    Attends meetings of clubs or organizations: Not on file    Relationship status: Not on file  . Intimate partner violence:    Fear of current or ex partner: Not on file    Emotionally abused: Not on file    Physically abused: Not on file    Forced sexual activity: Not on file  Other Topics Concern  . Not on file  Social History Narrative   Lives alone.     Family History  Problem Relation Age of Onset  . Diabetes Father   . Hypertension Father   . Heart failure Father      Review of Systems: General: negative for chills, fever, night sweats or weight changes.  Cardiovascular: negative for chest pain, dyspnea on exertion, edema, orthopnea, palpitations, paroxysmal nocturnal dyspnea or shortness of breath Dermatological: negative for rash Respiratory: negative for cough or wheezing Urologic: negative for hematuria Abdominal: negative for nausea, vomiting, diarrhea, bright red blood per rectum, melena, or hematemesis Neurologic: negative for visual changes, syncope, or dizziness All other systems reviewed and are otherwise negative except as noted above.    Blood pressure 124/76, pulse 74, height 6\' 3"  (1.905 m), weight (!) 320 lb (145.2 kg).  General appearance: alert, cooperative, no distress and morbidly obese Neck: no carotid bruit and no JVD Lungs: clear to  auscultation bilaterally Heart: regular rate and rhythm Extremities: Rt LE errythematous Skin: Skin color, texture, turgor normal. No rashes or lesions Neurologic: Grossly normal  EKG 10/24/17- NSR- 81  ASSESSMENT AND PLAN:   Dyspnea Possible anginal equivalent  Paroxysmal atrial fibrillation ([place]) He was not in AF when he went to the ED 10/24/16 and is not in AF in the office today.  His usual  presenting symptom when in AF is dyspnea.   Diabetic polyneuropathy associated with type 2 diabetes mellitus ([place]) Pt has DM with Charcot foot-Rt  Chronic anticoagulation Eliquis- CHADS VASC=2  Morbid obesity (HCC) BMI 40- suspected sleep apnea but pt declines test   PLAN  Check echo and Myoview (Lexiscan). F/U with Dr Claiborne Billings.   Kerin Ransom PA-C 10/26/2017 2:48 PM

## 2017-10-26 NOTE — Patient Instructions (Signed)
Medication Instructions:  Your physician recommends that you continue on your current medications as directed. Please refer to the Current Medication list given to you today.   Labwork: none  Testing/Procedures: Your physician has requested that you have a lexiscan myoview. For further information please visit [email or web address]. Please follow instruction sheet, as given.  Your physician has requested that you have an echocardiogram. Echocardiography is a painless test that uses sound waves to create images of your heart. It provides your doctor with information about the size and shape of your heart and how well your heart's chambers and valves are working. This procedure takes approximately one hour. There are no restrictions for this procedure.    Follow-Up: Your physician recommends that you schedule a follow-up appointment in: 3 months with Dr. Claiborne Billings.   Any Other Special Instructions Will Be Listed Below (If Applicable).     If you need a refill on your cardiac medications before your next appointment, please call your pharmacy.

## 2017-10-26 NOTE — Assessment & Plan Note (Signed)
BMI 40- suspected sleep apnea but pt declines test

## 2017-10-26 NOTE — Assessment & Plan Note (Signed)
Pt has DM with Charcot foot-Rt

## 2017-11-04 ENCOUNTER — Other Ambulatory Visit (HOSPITAL_COMMUNITY): Payer: Medicaid Other

## 2017-11-05 DIAGNOSIS — G890 Central pain syndrome: Secondary | ICD-10-CM | POA: Diagnosis not present

## 2017-11-08 ENCOUNTER — Ambulatory Visit (HOSPITAL_COMMUNITY): Payer: Medicaid Other

## 2017-11-09 ENCOUNTER — Telehealth (HOSPITAL_COMMUNITY): Payer: Self-pay

## 2017-11-09 NOTE — Telephone Encounter (Signed)
Encounter complete. 

## 2017-11-11 ENCOUNTER — Ambulatory Visit (HOSPITAL_COMMUNITY)
Admission: RE | Admit: 2017-11-11 | Discharge: 2017-11-11 | Disposition: A | Discharge status: Home / Self Care | Payer: Medicaid Other | Source: Ambulatory Visit | Attending: Cardiology | Admitting: Cardiology

## 2017-11-11 DIAGNOSIS — R0602 Shortness of breath: Secondary | ICD-10-CM | POA: Insufficient documentation

## 2017-11-11 DIAGNOSIS — Z7901 Long term (current) use of anticoagulants: Secondary | ICD-10-CM | POA: Diagnosis not present

## 2017-11-11 MED ORDER — TECHNETIUM TC 99M TETROFOSMIN IV KIT
30.7000 | PACK | Freq: Once | INTRAVENOUS | Status: AC | PRN
  Administered 2017-11-11: 30.7 via INTRAVENOUS
  Filled 2017-11-11: qty 31

## 2017-11-11 MED ORDER — REGADENOSON 0.4 MG/5ML IV SOLN
0.4000 mg | Freq: Once | INTRAVENOUS | Status: AC
  Administered 2017-11-11: 0.4 mg via INTRAVENOUS

## 2017-11-12 ENCOUNTER — Ambulatory Visit (HOSPITAL_COMMUNITY)
Admission: RE | Admit: 2017-11-12 | Discharge: 2017-11-12 | Disposition: A | Discharge status: Home / Self Care | Payer: Medicaid Other | Source: Ambulatory Visit | Attending: Cardiology | Admitting: Cardiology

## 2017-11-12 LAB — MYOCARDIAL PERFUSION IMAGING
LV dias vol: 192 mL (ref 62–150)
LV sys vol: 101 mL
Peak HR: 82 {beats}/min
Rest HR: 65 {beats}/min
SDS: 1
SRS: 1
SSS: 2
TID: 1.04

## 2017-11-12 MED ORDER — TECHNETIUM TC 99M TETROFOSMIN IV KIT
29.2000 | PACK | Freq: Once | INTRAVENOUS | Status: AC | PRN
  Administered 2017-11-12: 29.2 via INTRAVENOUS

## 2017-11-15 ENCOUNTER — Other Ambulatory Visit: Payer: Self-pay

## 2017-11-15 ENCOUNTER — Ambulatory Visit (HOSPITAL_COMMUNITY): Payer: Medicaid Other | Attending: Cardiology

## 2017-11-15 DIAGNOSIS — Z7901 Long term (current) use of anticoagulants: Secondary | ICD-10-CM | POA: Diagnosis not present

## 2017-11-15 DIAGNOSIS — R0600 Dyspnea, unspecified: Secondary | ICD-10-CM | POA: Diagnosis not present

## 2017-11-15 DIAGNOSIS — I4891 Unspecified atrial fibrillation: Secondary | ICD-10-CM | POA: Diagnosis not present

## 2017-11-15 DIAGNOSIS — Z6841 Body Mass Index (BMI) 40.0 and over, adult: Secondary | ICD-10-CM | POA: Insufficient documentation

## 2017-11-15 DIAGNOSIS — R0602 Shortness of breath: Secondary | ICD-10-CM | POA: Diagnosis not present

## 2017-11-15 DIAGNOSIS — Z87891 Personal history of nicotine dependence: Secondary | ICD-10-CM | POA: Diagnosis not present

## 2017-11-15 DIAGNOSIS — I10 Essential (primary) hypertension: Secondary | ICD-10-CM | POA: Insufficient documentation

## 2017-11-29 ENCOUNTER — Other Ambulatory Visit: Payer: Self-pay | Admitting: Family Medicine

## 2017-11-29 DIAGNOSIS — E1165 Type 2 diabetes mellitus with hyperglycemia: Secondary | ICD-10-CM

## 2017-11-29 DIAGNOSIS — IMO0002 Reserved for concepts with insufficient information to code with codable children: Secondary | ICD-10-CM

## 2017-11-29 DIAGNOSIS — Z794 Long term (current) use of insulin: Principal | ICD-10-CM

## 2017-11-29 DIAGNOSIS — E118 Type 2 diabetes mellitus with unspecified complications: Principal | ICD-10-CM

## 2017-12-03 DIAGNOSIS — M25512 Pain in left shoulder: Secondary | ICD-10-CM | POA: Diagnosis not present

## 2017-12-03 DIAGNOSIS — G894 Chronic pain syndrome: Secondary | ICD-10-CM | POA: Diagnosis not present

## 2017-12-03 DIAGNOSIS — M545 Low back pain: Secondary | ICD-10-CM | POA: Diagnosis not present

## 2017-12-03 DIAGNOSIS — M25552 Pain in left hip: Secondary | ICD-10-CM | POA: Diagnosis not present

## 2017-12-03 DIAGNOSIS — M25511 Pain in right shoulder: Secondary | ICD-10-CM | POA: Diagnosis not present

## 2017-12-15 ENCOUNTER — Encounter: Payer: Self-pay | Admitting: Family Medicine

## 2017-12-15 ENCOUNTER — Ambulatory Visit: Payer: Medicaid Other | Attending: Family Medicine | Admitting: Family Medicine

## 2017-12-15 VITALS — BP 132/70 | HR 72 | Temp 97.8°F | Ht 75.0 in | Wt 329.8 lb

## 2017-12-15 DIAGNOSIS — Z79899 Other long term (current) drug therapy: Secondary | ICD-10-CM | POA: Diagnosis not present

## 2017-12-15 DIAGNOSIS — IMO0002 Reserved for concepts with insufficient information to code with codable children: Secondary | ICD-10-CM

## 2017-12-15 DIAGNOSIS — I10 Essential (primary) hypertension: Secondary | ICD-10-CM | POA: Diagnosis not present

## 2017-12-15 DIAGNOSIS — Z6841 Body Mass Index (BMI) 40.0 and over, adult: Secondary | ICD-10-CM | POA: Insufficient documentation

## 2017-12-15 DIAGNOSIS — Z89422 Acquired absence of other left toe(s): Secondary | ICD-10-CM | POA: Insufficient documentation

## 2017-12-15 DIAGNOSIS — Z79891 Long term (current) use of opiate analgesic: Secondary | ICD-10-CM | POA: Insufficient documentation

## 2017-12-15 DIAGNOSIS — Z1211 Encounter for screening for malignant neoplasm of colon: Secondary | ICD-10-CM | POA: Diagnosis not present

## 2017-12-15 DIAGNOSIS — Z87891 Personal history of nicotine dependence: Secondary | ICD-10-CM | POA: Insufficient documentation

## 2017-12-15 DIAGNOSIS — Z9111 Patient's noncompliance with dietary regimen: Secondary | ICD-10-CM | POA: Diagnosis not present

## 2017-12-15 DIAGNOSIS — E11610 Type 2 diabetes mellitus with diabetic neuropathic arthropathy: Secondary | ICD-10-CM | POA: Insufficient documentation

## 2017-12-15 DIAGNOSIS — I480 Paroxysmal atrial fibrillation: Secondary | ICD-10-CM | POA: Diagnosis not present

## 2017-12-15 DIAGNOSIS — Z794 Long term (current) use of insulin: Secondary | ICD-10-CM | POA: Insufficient documentation

## 2017-12-15 DIAGNOSIS — E1142 Type 2 diabetes mellitus with diabetic polyneuropathy: Secondary | ICD-10-CM | POA: Diagnosis not present

## 2017-12-15 DIAGNOSIS — K760 Fatty (change of) liver, not elsewhere classified: Secondary | ICD-10-CM | POA: Insufficient documentation

## 2017-12-15 DIAGNOSIS — Z1159 Encounter for screening for other viral diseases: Secondary | ICD-10-CM

## 2017-12-15 DIAGNOSIS — M5136 Other intervertebral disc degeneration, lumbar region: Secondary | ICD-10-CM | POA: Diagnosis not present

## 2017-12-15 DIAGNOSIS — E1165 Type 2 diabetes mellitus with hyperglycemia: Secondary | ICD-10-CM | POA: Diagnosis not present

## 2017-12-15 DIAGNOSIS — Z7901 Long term (current) use of anticoagulants: Secondary | ICD-10-CM | POA: Insufficient documentation

## 2017-12-15 DIAGNOSIS — Z23 Encounter for immunization: Secondary | ICD-10-CM | POA: Diagnosis not present

## 2017-12-15 DIAGNOSIS — E669 Obesity, unspecified: Secondary | ICD-10-CM | POA: Diagnosis not present

## 2017-12-15 DIAGNOSIS — E118 Type 2 diabetes mellitus with unspecified complications: Secondary | ICD-10-CM

## 2017-12-15 DIAGNOSIS — Z96651 Presence of right artificial knee joint: Secondary | ICD-10-CM | POA: Insufficient documentation

## 2017-12-15 DIAGNOSIS — E119 Type 2 diabetes mellitus without complications: Secondary | ICD-10-CM | POA: Diagnosis present

## 2017-12-15 LAB — POCT GLYCOSYLATED HEMOGLOBIN (HGB A1C): Hemoglobin A1C: 9.3 % — AB (ref 4.0–5.6)

## 2017-12-15 LAB — GLUCOSE, POCT (MANUAL RESULT ENTRY): POC Glucose: 206 mg/dl — AB (ref 70–99)

## 2017-12-15 MED ORDER — INSULIN GLARGINE 100 UNIT/ML SOLOSTAR PEN: 45.0000 [IU] | PEN_INJECTOR | Freq: Two times a day (BID) | SUBCUTANEOUS | 6 refills | Status: DC

## 2017-12-15 NOTE — Progress Notes (Signed)
Subjective:  Patient ID: Jeffery [name], male    DOB: [birth year]  Age: 57 y.o. MRN: [id number]  CC: Diabetes   HPI Jeffery [name]  is a 57 year-old male who presents to clinic today for follow up of chronic medical conditions and concerns of pain. Pt with PMH of DM TYPE II (A1c 9.3 from today uncontrolled; non compliant with diet, does not check blood glucose or take gabapentin regularly, last eye exam unknown, denies adverse effects of humalog & lantus), HTN ( tolerating antihypertensives/statins without adverse effects, stopped lipitor for 1 month because he heard it can cause muscle pain says he will restart, non compliant with DASH diet, says he is unable to exercise regularly due to pain), A.Fib ( taking eliquis, denies bruising/bleeding), DDD/Sciatica (taking percocet 5/366m 3 times daily, zanaflex 4 mg as needed, followed by pain clinic).  Past Medical History:  Diagnosis Date  . Atrial fibrillation ([place]   . Charcot's joint of right foot   . DDD (degenerative disc disease), lumbar   . Diabetes mellitus without complication ([place]   . Fatty liver   . Hypertension   . Obesity   . Rotator cuff disorder   . Shoulder impingement, right    Social History   Socioeconomic History  . Marital status: Single    Spouse name: Not on file  . Number of children: Not on file  . Years of education: Not on file  . Highest education level: Not on file  Occupational History  . Occupation: LManagement consultant Social Needs  . Financial resource strain: Not on file  . Food insecurity:    Worry: Not on file    Inability: Not on file  . Transportation needs:    Medical: Not on file    Non-medical: Not on file  Tobacco Use  . Smoking status: Former Smoker    Packs/day: 0.00    Years: 38.00    Pack years: 0.00    Types: Cigarettes  . Smokeless tobacco: Never Used  Substance and Sexual Activity  . Alcohol use: No    Alcohol/week: 5.0 - 6.0 standard drinks    Types:  5 - 6 Cans of beer per week  . Drug use: Yes    Types: Marijuana    Comment: last week ; denies 03/24/17  . Sexual activity: Not on file  Lifestyle  . Physical activity:    Days per week: Not on file    Minutes per session: Not on file  . Stress: Not on file  Relationships  . Social connections:    Talks on phone: Not on file    Gets together: Not on file    Attends religious service: Not on file    Active member of club or organization: Not on file    Attends meetings of clubs or organizations: Not on file    Relationship status: Not on file  . Intimate partner violence:    Fear of current or ex partner: Not on file    Emotionally abused: Not on file    Physically abused: Not on file    Forced sexual activity: Not on file  Other Topics Concern  . Not on file  Social History Narrative   Lives alone.   Past Surgical History:  Procedure Laterality Date  . AMPUTATION Left 10/02/2014   Procedure: Left Third toe amputation ;  Surgeon: NLeandrew Koyanagi MD;  [place]  Service: Orthopedics;  Laterality: Left;  Regular bed,  wants to follow hip  . ANTERIOR CRUCIATE LIGAMENT REPAIR Right 90   reconstruction  . APPLICATION OF WOUND VAC Left 10/02/2014   Procedure: APPLICATION OF WOUND VAC; toe Surgeon: [name], MD;  Location: [place];  Service: Orthopedics;  Laterality: Left;  . I&D EXTREMITY Left 10/05/2014   Procedure: IRRIGATION AND DEBRIDEMENT LEFT FOOT;  Surgeon: [name], MD;  Location: [place];  Service: Orthopedics;  Laterality: Left;  . KNEE ARTHROSCOPY W/ ACL RECONSTRUCTION Right   . TOTAL KNEE ARTHROPLASTY Right 03/28/2015  . TOTAL KNEE ARTHROPLASTY Right 03/28/2015   Procedure: RIGHT TOTAL KNEE ARTHROPLASTY;  Surgeon: [name], MD;  Location: [facility];  Service: Orthopedics;  Laterality: Right;   No Known Allergies   Outpatient Medications Prior to Visit  Medication Sig Dispense Refill  . apixaban (ELIQUIS) 5 MG TABS tablet Take 1 tablet (5 mg total) by mouth 2  (two) times daily. 180 tablet 3  . atorvastatin (LIPITOR) 20 MG tablet Take 1 tablet (20 mg total) by mouth daily. 90 tablet 1  . Blood Glucose Monitoring Suppl (TRUE METRIX METER) DEVI 1 each by Does not apply route 3 (three) times daily before meals. 1 Device 0  . gabapentin (NEURONTIN) 300 MG capsule Take 1 capsule (300 mg total) by mouth at bedtime. (Patient taking differently: Take 300 mg by mouth at bedtime as needed (foot tingling). ) 90 capsule 1  . glucose blood (TRUE METRIX BLOOD GLUCOSE TEST) test strip Use 3 times daily before meals 100 each 12  . insulin lispro (HUMALOG) 100 UNIT/ML injection Inject 0.07 mLs (7 Units total) into the skin 3 (three) times daily before meals. 30 mL 6  . Insulin Pen Needle (B-D ULTRAFINE III SHORT PEN) 31G X 8 MM MISC 1 each by Does not apply route 3 (three) times daily. 100 each 5  . Insulin Syringe-Needle U-100 (TRUEPLUS INSULIN SYRINGE) 30G X 5/16" 0.5 ML MISC Use as directed 3 times daily 100 each 5  . losartan (COZAAR) 50 MG tablet Take 1 tablet (50 mg total) by mouth daily. 90 tablet 1  . metoprolol tartrate (LOPRESSOR) 100 MG tablet Take 1 tablet (100 mg total) by mouth 2 (two) times daily. 180 tablet 1  . oxyCODONE-acetaminophen (PERCOCET) 5-325 MG tablet 1-2 tabs po bid prn pain 30 tablet 0  . sodium chloride (OCEAN) 0.65 % SOLN nasal spray Place 1 spray into both nostrils 2 (two) times daily as needed for congestion.    Marland Kitchen tiZANidine (ZANAFLEX) 4 MG tablet Take 1 tablet (4 mg total) by mouth every 6 (six) hours as needed for muscle spasms. 30 tablet 2  . TRUEPLUS INSULIN SYRINGE 30G X 5/16" 0.5 ML MISC USE AS DIRECTED 3 TIMES DAILY 100 each 0  . TRUEPLUS LANCETS 28G MISC 1 each by Does not apply route 3 (three) times daily before meals. 100 each 12  . LANTUS SOLOSTAR 100 UNIT/ML Solostar Pen INJECT 40 UNITS INTO SKIN TWO TIMES A DAY 24 mL 0   No facility-administered medications prior to visit.     ROS Review of Systems  Constitutional:  Negative.   HENT: Negative.   Eyes: Negative.   Respiratory: Negative.   Cardiovascular: Negative.   Endocrine: Negative.   Musculoskeletal: Positive for back pain and joint swelling.       Right ankle swelling after standing long periods of time.  Skin: Negative.   Neurological: Positive for numbness.       Complains of numbness to bilateral feet  Psychiatric/Behavioral: Negative.     Objective:  BP 132/70   Pulse 72   Temp 97.8 F (36.6 C) (Oral)   Ht 6' 3"  (1.905 m)   Wt (!) 329 lb 12.8 oz (149.6 kg)   SpO2 98%   BMI 41.22 kg/m   BP/Weight 12/15/2017 11/11/2017 5/36/1443  Systolic BP 154 - 008  Diastolic BP 70 - 76  Wt. (Lbs) 329.8 320 320  BMI 41.22 40 40      Physical Exam  Constitutional: He is oriented to person, place, and time. He appears well-developed and well-nourished.  Eyes: Pupils are equal, round, and reactive to light. EOM are normal.  Cardiovascular: Normal rate, regular rhythm, normal heart sounds and intact distal pulses. Exam reveals no gallop and no friction rub.  No murmur heard. Pulmonary/Chest: Effort normal and breath sounds normal.  Musculoskeletal: He exhibits edema and deformity.  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 He displays normal reflexes.  Skin: Skin is warm and dry.  Psychiatric: He has a normal mood and affect. His behavior is normal. Judgment and thought content normal.  Vitals reviewed.   CMP Latest Ref Rng & Units 10/24/2017 04/15/2017 03/26/2017  Glucose 70 - 99 mg/dL 277(H) CANCELED 107(H)  BUN 6 - 20 mg/dL 15 CANCELED 12  Creatinine 0.61 - 1.24 mg/dL 1.13 CANCELED 0.73  Sodium 135 - 145 mmol/L 134(L) CANCELED 137  Potassium 3.5 - 5.1 mmol/L 4.6 CANCELED 4.7  Chloride 98 - 111 mmol/L 101 CANCELED 104  CO2 22 - 32 mmol/L 25 CANCELED 23  Calcium 8.9 - 10.3 mg/dL 9.4 CANCELED 9.0  Total Protein - - CANCELED 7.1    Total Bilirubin - - CANCELED 0.8  Alkaline Phos - - CANCELED 73  AST - - CANCELED 28  ALT - - CANCELED 23    Lipid Panel     Component Value Date/Time   CHOL CANCELED 04/15/2017 1022   TRIG CANCELED 04/15/2017 1022   HDL CANCELED 04/15/2017 1022   CHOLHDL 4.5 11/04/2016 0833   CHOLHDL 3.8 04/10/2010 0315   VLDL 32 04/10/2010 0315   LDLCALC 108 (H) 11/04/2016 0833    Lab Results  Component Value Date   HGBA1C 9.3 (A) 12/15/2017    Assessment & Plan:   1. Diabetic polyneuropathy associated with type 2 diabetes mellitus (HCC) Uncontrolled with A1c of 9.3 - POCT glucose 206 - POCT glycosylated hemoglobin (9.3) - Continue Gabapentin 386m at bedtime as needed. Patient instructed on medication compliance to help with neuropathy. - Ambulatory referral to Ophthalmology  2. Uncontrolled type 2 diabetes mellitus with complication, with long-term current use of insulin (HCC) - uncontrolled UPOCT glucose 206 - POCT glycosylated hemoglobin (9.3) - Increase Lantus from 40 to 45 units 2 times a day. Patient instructed to watch for signs and symptoms of hypoglycemia. Importance of adherence to diabetic diet, regularly checking blood glucose and medication compliance discussed with patient. Patient verbalizes that he understands.  - CMP14+EGFR; Future - Lipid panel; Future - Microalbumin/Creatinine Ratio, Urine; Future - Insulin Glargine (LANTUS SOLOSTAR) 100 UNIT/ML Solostar Pen; Inject 45 Units into the skin 2 (two) times daily.  Dispense: 30 mL; Refill: 6  3. Screening for viral disease - HIV Antibody (routine testing w rflx); Future - Hepatitis c antibody (reflex); Future  4. Screening for colon cancer - Ambulatory referral to Gastroenterology  5. Charcot foot due to diabetes mellitus ([place] Uncontrolled -  Patient to follow up with pain management clinic.   6. Paroxysmal Afib, Currently in sinus rhythm Rate control with  Metoprolol, anticoagulation with Eliquis - Continue  Apixaban (Eliquis) 44m  2 (two) times a daily.   7. DDD (degenerative disc disease), lumbar Uncontrolled on current pain regimen - Continue Percocet 5/32 587mthree (3) times a day and Zanaflex 4 mg every 6 hours as needed. Patient to follow-up with pain management clinic, has appointment with pain clinic soon.    Meds ordered this encounter  Medications  . Insulin Glargine (LANTUS SOLOSTAR) 100 UNIT/ML Solostar Pen    Sig: Inject 45 Units into the skin 2 (two) times daily.    Dispense:  30 mL    Refill:  6    Follow-up: Return in about 3 months (around 03/16/2018) for follow up of chronic medical conditions.   EnCharlott RakesD

## 2017-12-15 NOTE — Progress Notes (Signed)
Patient needs medications.  Patient needs referral to neurology.

## 2017-12-15 NOTE — Patient Instructions (Signed)
Diabetes Mellitus and Nutrition When you have diabetes (diabetes mellitus), it is very important to have healthy eating habits because your blood sugar (glucose) levels are greatly affected by what you eat and drink. Eating healthy foods in the appropriate amounts, at about the same times every day, can help you:  Control your blood glucose.  Lower your risk of heart disease.  Improve your blood pressure.  Reach or maintain a healthy weight.  Every person with diabetes is different, and each person has different needs for a meal plan. Your health care provider may recommend that you work with a diet and nutrition specialist (dietitian) to make a meal plan that is best for you. Your meal plan may vary depending on factors such as:  The calories you need.  The medicines you take.  Your weight.  Your blood glucose, blood pressure, and cholesterol levels.  Your activity level.  Other health conditions you have, such as heart or kidney disease.  How do carbohydrates affect me? Carbohydrates affect your blood glucose level more than any other type of food. Eating carbohydrates naturally increases the amount of glucose in your blood. Carbohydrate counting is a method for keeping track of how many carbohydrates you eat. Counting carbohydrates is important to keep your blood glucose at a healthy level, especially if you use insulin or take certain oral diabetes medicines. It is important to know how many carbohydrates you can safely have in each meal. This is different for every person. Your dietitian can help you calculate how many carbohydrates you should have at each meal and for snack. Foods that contain carbohydrates include:  Bread, cereal, rice, pasta, and crackers.  Potatoes and corn.  Peas, beans, and lentils.  Milk and yogurt.  Fruit and juice.  Desserts, such as cakes, cookies, ice cream, and candy.  How does alcohol affect me? Alcohol can cause a sudden decrease in blood  glucose (hypoglycemia), especially if you use insulin or take certain oral diabetes medicines. Hypoglycemia can be a life-threatening condition. Symptoms of hypoglycemia (sleepiness, dizziness, and confusion) are similar to symptoms of having too much alcohol. If your health care provider says that alcohol is safe for you, follow these guidelines:  Limit alcohol intake to no more than 1 drink per day for nonpregnant women and 2 drinks per day for men. One drink equals 12 oz of beer, 5 oz of wine, or 1 oz of hard liquor.  Do not drink on an empty stomach.  Keep yourself hydrated with water, diet soda, or unsweetened iced tea.  Keep in mind that regular soda, juice, and other mixers may contain a lot of sugar and must be counted as carbohydrates.  What are tips for following this plan? Reading food labels  Start by checking the serving size on the label. The amount of calories, carbohydrates, fats, and other nutrients listed on the label are based on one serving of the food. Many foods contain more than one serving per package.  Check the total grams (g) of carbohydrates in one serving. You can calculate the number of servings of carbohydrates in one serving by dividing the total carbohydrates by 15. For example, if a food has 30 g of total carbohydrates, it would be equal to 2 servings of carbohydrates.  Check the number of grams (g) of saturated and trans fats in one serving. Choose foods that have low or no amount of these fats.  Check the number of milligrams (mg) of sodium in one serving. Most people   should limit total sodium intake to less than 2,300 mg per day.  Always check the nutrition information of foods labeled as "low-fat" or "nonfat". These foods may be higher in added sugar or refined carbohydrates and should be avoided.  Talk to your dietitian to identify your daily goals for nutrients listed on the label. Shopping  Avoid buying canned, premade, or processed foods. These  foods tend to be high in fat, sodium, and added sugar.  Shop around the outside edge of the grocery store. This includes fresh fruits and vegetables, bulk grains, fresh meats, and fresh dairy. Cooking  Use low-heat cooking methods, such as baking, instead of high-heat cooking methods like deep frying.  Cook using healthy oils, such as olive, canola, or sunflower oil.  Avoid cooking with butter, cream, or high-fat meats. Meal planning  Eat meals and snacks regularly, preferably at the same times every day. Avoid going long periods of time without eating.  Eat foods high in fiber, such as fresh fruits, vegetables, beans, and whole grains. Talk to your dietitian about how many servings of carbohydrates you can eat at each meal.  Eat 4-6 ounces of lean protein each day, such as lean meat, chicken, fish, eggs, or tofu. 1 ounce is equal to 1 ounce of meat, chicken, or fish, 1 egg, or 1/4 cup of tofu.  Eat some foods each day that contain healthy fats, such as avocado, nuts, seeds, and fish. Lifestyle   Check your blood glucose regularly.  Exercise at least 30 minutes 5 or more days each week, or as told by your health care provider.  Take medicines as told by your health care provider.  Do not use any products that contain nicotine or tobacco, such as cigarettes and e-cigarettes. If you need help quitting, ask your health care provider.  Work with a counselor or diabetes educator to identify strategies to manage stress and any emotional and social challenges. What are some questions to ask my health care provider?  Do I need to meet with a diabetes educator?  Do I need to meet with a dietitian?  What number can I call if I have questions?  When are the best times to check my blood glucose? Where to find more information:  American Diabetes Association: diabetes.org/food-and-fitness/food  Academy of Nutrition and Dietetics:  www.eatright.org/resources/health/diseases-and-conditions/diabetes  National Institute of Diabetes and Digestive and Kidney Diseases (NIH): www.niddk.nih.gov/health-information/diabetes/overview/diet-eating-physical-activity Summary  A healthy meal plan will help you control your blood glucose and maintain a healthy lifestyle.  Working with a diet and nutrition specialist (dietitian) can help you make a meal plan that is best for you.  Keep in mind that carbohydrates and alcohol have immediate effects on your blood glucose levels. It is important to count carbohydrates and to use alcohol carefully. This information is not intended to replace advice given to you by your health care provider. Make sure you discuss any questions you have with your health care provider. Document Released: 12/11/2004 Document Revised: 04/20/2016 Document Reviewed: 04/20/2016 Elsevier Interactive Patient Education  2018 [employer]  

## 2017-12-30 DIAGNOSIS — M25512 Pain in left shoulder: Secondary | ICD-10-CM | POA: Diagnosis not present

## 2017-12-30 DIAGNOSIS — M545 Low back pain: Secondary | ICD-10-CM | POA: Diagnosis not present

## 2017-12-30 DIAGNOSIS — G894 Chronic pain syndrome: Secondary | ICD-10-CM | POA: Diagnosis not present

## 2017-12-30 DIAGNOSIS — M25552 Pain in left hip: Secondary | ICD-10-CM | POA: Diagnosis not present

## 2017-12-30 DIAGNOSIS — M25511 Pain in right shoulder: Secondary | ICD-10-CM | POA: Diagnosis not present

## 2018-01-20 ENCOUNTER — Ambulatory Visit (INDEPENDENT_AMBULATORY_CARE_PROVIDER_SITE_OTHER): Payer: Medicaid Other | Admitting: Cardiovascular Disease

## 2018-01-20 ENCOUNTER — Encounter: Payer: Self-pay | Admitting: Cardiovascular Disease

## 2018-01-20 VITALS — BP 140/80 | HR 71 | Ht 75.0 in | Wt 333.2 lb

## 2018-01-20 DIAGNOSIS — I480 Paroxysmal atrial fibrillation: Secondary | ICD-10-CM | POA: Diagnosis not present

## 2018-01-20 DIAGNOSIS — E1142 Type 2 diabetes mellitus with diabetic polyneuropathy: Secondary | ICD-10-CM

## 2018-01-20 DIAGNOSIS — E785 Hyperlipidemia, unspecified: Secondary | ICD-10-CM

## 2018-01-20 DIAGNOSIS — E11610 Type 2 diabetes mellitus with diabetic neuropathic arthropathy: Secondary | ICD-10-CM

## 2018-01-20 DIAGNOSIS — N529 Male erectile dysfunction, unspecified: Secondary | ICD-10-CM | POA: Diagnosis not present

## 2018-01-20 DIAGNOSIS — Z7901 Long term (current) use of anticoagulants: Secondary | ICD-10-CM | POA: Diagnosis not present

## 2018-01-20 DIAGNOSIS — Z79899 Other long term (current) drug therapy: Secondary | ICD-10-CM | POA: Diagnosis not present

## 2018-01-20 DIAGNOSIS — I10 Essential (primary) hypertension: Secondary | ICD-10-CM | POA: Diagnosis not present

## 2018-01-20 NOTE — Progress Notes (Signed)
Cardiology Office Note    Date:  01/21/2018   ID:  Jeffery [name], DOB September 29, 1960, MRN [id number]  PCP:  [name], MD  Cardiologist:  Shelva Majestic, MD   No chief complaint on file.   History of Present Illness:  Jeffery [name] is a 57 y.o. male who is the son of my former patient Jeffery [name].    He established cardiology care with me in December 2018.  I last saw him in March 2019.  He presents for 20-monthfollow-up evaluation  Mr. BBreonhas a history of obesity, diabetes mellitus, and hypertension.  He previously had a significant EtOH history and was drinking up to 4 cases of beer per week.  In May after having significant amount of beer the night before he woke up in the morning feeling weak.  He presented and was found to be in atrial fibrillation of new onset.  He was placed on Lopressor and eliquis and underwent successful cardioversion.  His cha2ds2vasc score was 2.  He was seen in A. fib clinic in follow-up on 08/27/2016 and was in sinus rhythm with PACs.  An echo Doppler study showed an EF of 50-55% with grade 1 diastolic dysfunction.  His left atrium was moderately dilated.  There was trivial MR.  He had done well maintaining sinus rhythm until August when again he had another alcohol binge and was back in atrial fibrillation.  He had been having difficulty with his back and had been off eliquis a week earlier to allow for treatment with ibuprofen.  He again was cardioverted for his A. fib with a rate of 169 on presentation and eliquis was resumed.  The patient has not had any alcohol since that presentation and quit smoking.    When I saw him in December 2018 he was maintaining sinus rhythm. He was having significant difficulty with his back. He has a Charcot foot and has difficulty walking. He denied any chest tightness or chest pressure.  He was unable to exercise due to his back discomfort. He had run out of his metoprolol as pulses in the 90s with PACs.. I  further titrated metoprolol to 75 mg twice a day, and he has continued to be on losartan 50 mg daily for HTN.    I last saw him in March 2019 at which time he had noticed occasional palpitations.  He was unable to exercise due to his back and foot.  He quit tobacco and alcohol on November 01, 2016.  Over the past 6 months, he has seen Jeffery Ransomafter he had gone to the emergency room with complaints of dyspnea at rest.  He thought he had gone back into atrial fibrillation but in the emergency room he was in normal sinus rhythm.  Troponins were negative.  A close friend had recently had CABG revascularization.  He subsequently was referred for an nuclear perfusion study which was normal on November 11, 2017.  An echo Doppler study revealed normal EF at 55 to 60% with grade 1 diastolic dysfunction.  Peak PA pressure was minimally increased at 33 mm.  He has chronic pain syndrome and apparently the pain clinic prescribes Percocet.  He lives with his mother.  [facility]akesa to nd is scheduled to undergo laboratory.  He is asked if he could also have his testosterone checked with her laboratory and other labs that I request.  He denies recurrent chest pain.  He presents for evaluation  Past Medical History:  Diagnosis Date  . Atrial fibrillation ([place])   . Charcot's joint of right foot   . DDD (degenerative disc disease), lumbar   . Diabetes mellitus without complication (Jeffery [name])   . Fatty liver   . Hypertension   . Obesity   . Rotator cuff disorder   . Shoulder impingement, right     Past Surgical History:  Procedure Laterality Date  . AMPUTATION Left 10/02/2014   Procedure: Left Third toe amputation ;  Surgeon: Jeffery Koyanagi, MD;  Location: [place];  Service: Orthopedics;  Laterality: Left;  Regular bed, wants to follow hip  . ANTERIOR CRUCIATE LIGAMENT REPAIR Right 90   reconstruction  . APPLICATION OF WOUND VAC Left 10/02/2014   Procedure: APPLICATION  OF WOUND VAC; toe Surgeon: Jeffery Koyanagi, MD;  Location: [place];  Service: Orthopedics;  Laterality: Left;  . I&D EXTREMITY Left 10/05/2014   Procedure: IRRIGATION AND DEBRIDEMENT LEFT FOOT;  Surgeon: Jeffery Koyanagi, MD;  Location: [place];  Service: Orthopedics;  Laterality: Left;  . KNEE ARTHROSCOPY W/ ACL RECONSTRUCTION Right   . TOTAL KNEE ARTHROPLASTY Right 03/28/2015  . TOTAL KNEE ARTHROPLASTY Right 03/28/2015   Procedure: RIGHT TOTAL KNEE ARTHROPLASTY;  Surgeon: Jeffery Koyanagi, MD;  [facility];  Service: Orthopedics;  Laterality: Right;    Current Medications: Outpatient Medications Prior to Visit  Medication Sig Dispense Refill  . apixaban (ELIQUIS) 5 MG TABS tablet Take 1 tablet (5 mg total) by mouth 2 (two) times daily. 180 tablet 3  . atorvastatin (LIPITOR) 20 MG tablet Take 1 tablet (20 mg total) by mouth daily. 90 tablet 1  . Blood Glucose Monitoring Suppl (TRUE METRIX METER) DEVI 1 each by Does not apply route 3 (three) times daily before meals. 1 Device 0  . gabapentin (NEURONTIN) 300 MG capsule Take 1 capsule (300 mg total) by mouth at bedtime. (Patient taking differently: Take 300 mg by mouth at bedtime as needed (foot tingling). ) 90 capsule 1  . glucose blood (TRUE METRIX BLOOD GLUCOSE TEST) test strip Use 3 times daily before meals 100 each 12  . Insulin Glargine (LANTUS SOLOSTAR) 100 UNIT/ML Solostar Pen Inject 45 Units into the skin 2 (two) times daily. 30 mL 6  . insulin lispro (HUMALOG) 100 UNIT/ML injection Inject 0.07 mLs (7 Units total) into the skin 3 (three) times daily before meals. 30 mL 6  . Insulin Pen Needle (B-D ULTRAFINE III SHORT PEN) 31G X 8 MM MISC 1 each by Does not apply route 3 (three) times daily. 100 each 5  . Insulin Syringe-Needle U-100 (TRUEPLUS INSULIN SYRINGE) 30G X 5/16" 0.5 ML MISC Use as directed 3 times daily 100 each 5  . losartan (COZAAR) 50 MG tablet Take 1 tablet (50 mg total) by mouth daily. 90 tablet 1  . metoprolol tartrate (LOPRESSOR) 100  MG tablet Take 1 tablet (100 mg total) by mouth 2 (two) times daily. 180 tablet 1  . oxyCODONE-acetaminophen (PERCOCET) 5-325 MG tablet 1-2 tabs po bid prn pain 30 tablet 0  . sodium chloride (OCEAN) 0.65 % SOLN nasal spray Place 1 spray into both nostrils 2 (two) times daily as needed for congestion.    Marland Kitchen tiZANidine (ZANAFLEX) 4 MG tablet Take 1 tablet (4 mg total) by mouth every 6 (six) hours as needed for muscle spasms. 30 tablet 2  . TRUEPLUS INSULIN SYRINGE 30G X 5/16" 0.5 ML MISC USE AS DIRECTED 3 TIMES DAILY 100  each 0  . TRUEPLUS LANCETS 28G MISC 1 each by Does not apply route 3 (three) times daily before meals. 100 each 12   No facility-administered medications prior to visit.      Allergies:   Patient has no known allergies.   Social History   Socioeconomic History  . Marital status: Single    Spouse name: Not on file  . Number of children: Not on file  . Years of education: Not on file  . Highest education level: Not on file  Occupational History  . Occupation: Management consultant  Social Needs  . Financial resource strain: Not on file  . Food insecurity:    Worry: Not on file    Inability: Not on file  . Transportation needs:    Medical: Not on file    Non-medical: Not on file  Tobacco Use  . Smoking status: Former Smoker    Packs/day: 0.00    Years: 38.00    Pack years: 0.00    Types: Cigarettes  . Smokeless tobacco: Never Used  Substance and Sexual Activity  . Alcohol use: No    Alcohol/week: 5.0 - 6.0 standard drinks    Types: 5 - 6 Cans of beer per week  . Drug use: Yes    Types: Marijuana    Comment: last week ; denies 03/24/17  . Sexual activity: Not on file  Lifestyle  . Physical activity:    Days per week: Not on file    Minutes per session: Not on file  . Stress: Not on file  Relationships  . Social connections:    Talks on phone: Not on file    Gets together: Not on file    Attends religious service: Not on file    Active member  of club or organization: Not on file    Attends meetings of clubs or organizations: Not on file    Relationship status: Not on file  Other Topics Concern  . Not on file  Social History Narrative   Lives alone.    Socially, never married.  He has no children.  He is currently living with his mother.  He is not working.  Previously had done jobs.  Family History:  The patient's family history includes Diabetes in his father; Heart failure in his father; Hypertension in his father.  His father died at age 31.  Mother is still living at age 24.  ROS General: Negative; No fevers, chills, or night sweats;  HEENT: Negative; No changes in vision or hearing, sinus congestion, difficulty swallowing Pulmonary: Negative; No cough, wheezing, shortness of breath, hemoptysis Cardiovascular: Negative; No chest pain, presyncope, syncope, palpitations GI: Negative; No nausea, vomiting, diarrhea, or abdominal pain GU: Negative; No dysuria, hematuria, or difficulty voiding Musculoskeletal: Chronic pain due to spinal stenosis and Charcot foot Hematologic/Oncology: Negative; no easy bruising, bleeding Endocrine: Negative; no heat/cold intolerance; no diabetes Neuro: Negative; no changes in balance, headaches Skin: Negative; No rashes or skin lesions Psychiatric: Negative; No behavioral problems, depression Sleep: Positive for snoring, daytime sleepiness, hypersomnolence, bruxism, restless legs, hypnogognic hallucinations, no cataplexy Other comprehensive 14 point system review is negative.   PHYSICAL EXAM:   BP 140/80   Pulse 71   Ht 6' 3" (1.905 m)   Wt (!) 333 lb 3.2 oz (151.1 kg)   BMI 41.65 kg/m    Repeat blood pressure by me was 138/82  Wt Readings from Last 3 Encounters:  01/20/18 (!) 333 lb 3.2 oz (151.1 kg)  12/15/17 (!) 329 lb 12.8 oz (149.6 kg)  11/11/17 (!) 320 lb (145.2 kg)    General: Alert, oriented, no distress.  Morbidly obese Skin: normal turgor, no rashes, warm and  dry HEENT: Normocephalic, atraumatic. Pupils equal round and reactive to light; sclera anicteric; extraocular muscles intact; Nose without nasal septal hypertrophy Mouth/Parynx benign; Mallinpatti scale 3/4 Neck: No JVD, no carotid bruits; normal carotid upstroke Lungs: clear to ausculatation and percussion; no wheezing or rales Chest wall: without tenderness to palpitation Heart: PMI not displaced, RRR, s1 s2 normal, 1/6 systolic murmur, no diastolic murmur, no rubs, gallops, thrills, or heaves Abdomen: Central adiposity soft, nontender; no hepatosplenomehaly, BS+; abdominal aorta nontender and not dilated by palpation. Back: no CVA tenderness Pulses 2+ Musculoskeletal: full range of motion, normal strength, no joint deformities Extremities: Charcotfoot; no clubbing cyanosis, Homan's sign negative  Neurologic: grossly nonfocal; Cranial nerves grossly wnl Psychologic: Normal mood and affect   Studies/Labs Reviewed:   EKG:  EKG is ordered today.  ECG (independently read by me): Sinus rhythm at 71 bpm.  No ectopy.  Normal intervals.  March 2019 ECG (independently read by me): normal sinus rhythm at 88 bpm. QTc interval 433 ms.  December 2018 ECG (independently read by me): Sinus rhythm in the 90s with PACs.  QTc interval 442 ms.  Recent Labs: BMP Latest Ref Rng & Units 10/24/2017 04/15/2017 03/26/2017  Glucose 70 - 99 mg/dL 277(H) CANCELED 107(H)  BUN 6 - 20 mg/dL 15 CANCELED 12  Creatinine 0.61 - 1.24 mg/dL 1.13 CANCELED 0.73  BUN/Creat Ratio 9 - 20 - - -  Sodium 135 - 145 mmol/L 134(L) CANCELED 137  Potassium 3.5 - 5.1 mmol/L 4.6 CANCELED 4.7  Chloride 98 - 111 mmol/L 101 CANCELED 104  CO2 22 - 32 mmol/L 25 CANCELED 23  Calcium 8.9 - 10.3 mg/dL 9.4 CANCELED 9.0     Hepatic Function 04/15/2017 03/26/2017 11/04/2016  Total Protein CANCELED 7.1 7.2  Albumin CANCELED 3.8 4.0  AST CANCELED 28 15  ALT CANCELED 23 22  Alk Phosphatase CANCELED 73 79  Total Bilirubin CANCELED 0.8 0.4     CBC Latest Ref Rng & Units 10/24/2017 04/15/2017 03/26/2017  WBC 4.0 - 10.5 K/uL 9.0 9.8 9.3  Hemoglobin 13.0 - 17.0 g/dL 15.3 14.7 14.6  Hematocrit 39.0 - 52.0 % 47.6 42.9 44.5  Platelets 150 - 400 K/uL 195 221 203   Lab Results  Component Value Date   MCV 86.9 10/24/2017   MCV 84 04/15/2017   MCV 86.4 03/26/2017   Lab Results  Component Value Date   TSH 2.140 04/15/2017   Lab Results  Component Value Date   HGBA1C 9.3 (A) 12/15/2017     BNP No results found for: BNP  ProBNP No results found for: PROBNP   Lipid Panel     Component Value Date/Time   CHOL CANCELED 04/15/2017 1022   TRIG CANCELED 04/15/2017 1022   HDL CANCELED 04/15/2017 1022   CHOLHDL 4.5 11/04/2016 0833   CHOLHDL 3.8 04/10/2010 0315   VLDL 32 04/10/2010 0315   LDLCALC 108 (H) 11/04/2016 0833     RADIOLOGY: No results found.   Additional studies/ records that were reviewed today include:  I reviewed the patient's atrial fibrillation clinic records as well as his ER visits.  I reviewed his most recent echo Doppler study and nuclear perfusion studies of August 2019  ------------------------------------------------------------------- 08/18/2016 ECHO Study Conclusions  - Left ventricle: The cavity size was mildly dilated. Wall   thickness was  increased in a pattern of mild LVH. Systolic   function was normal. The estimated ejection fraction was in the   range of 50% to 55%. Wall motion was normal; there were no   regional wall motion abnormalities. Doppler parameters are   consistent with abnormal left ventricular relaxation (grade 1   diastolic dysfunction). - Aortic valve: Transvalvular velocity was within the normal range.   There was no stenosis. There was no regurgitation. - Mitral valve: Transvalvular velocity was within the normal range.   There was no evidence for stenosis. There was trivial   regurgitation. - Left atrium: The atrium was moderately dilated. - Right ventricle:  The cavity size was normal. Wall thickness was   normal. Systolic function was normal. - Tricuspid valve: There was no regurgitation.  ------------------------------------------------------------------- August 2019  [facility] Conclusions  - Left ventricle: The cavity size was moderately dilated. Wall   thickness was normal. Systolic function was normal. The estimated   ejection fraction was in the range of 55% to 60%. Wall motion was   normal; there were no regional wall motion abnormalities. Doppler   parameters are consistent with abnormal left ventricular   relaxation (grade 1 diastolic dysfunction). - Right ventricle: The cavity size was moderately dilated. Wall   thickness was normal. - Pulmonary arteries: Systolic pressure was mildly increased. PA   peak pressure: 33 mm Hg (S).   Gated SPECT myocardial perfusion study Highlights    Normal perfusion No ischemia or scar  LVEF is calculated at 48% Visual estimate suggests LVEF is greater Consider echo to confirm  The study is normal.  There was no ST segment deviation noted during stress.     ASSESSMENT:    1. Paroxysmal atrial fibrillation (HCC)   2. Essential hypertension   3. Erectile dysfunction, unspecified erectile dysfunction type   4. Medication management   5. Chronic anticoagulation   6. Morbid obesity ([place])   7. Diabetic polyneuropathy associated with type 2 diabetes mellitus ([place])   8. Hyperlipidemia with target LDL less than 70   9. Charcot foot due to diabetes mellitus [facility])      PLAN:  Mr. Jasiah Elsen is a 57 year old gentleman who has a long-standing history of obesity, and has been on diabetic medication since 2016.  In addition, has a history of hypertension and hyperlipidemia.  He previously had significant EtOH history and his 2 episodes of atrial fibrillation in 2018 occurred after significant alcohol binging.  He is not had any alcohol since his last cardioversion in August 2018 and  also at that time quit tobacco.  I reviewed his most recent echo Doppler data which continues to show normal systolic function with grade 1 diastolic dysfunction.  LV function is actually improved from his previous echo evaluation.  His most recent nuclear perfusion study did not show scar or ischemia and had normal ECG response.  His blood pressure today is upper normal on his current dose of losartan 50 mg daily and metoprolol 100 mg twice a day.  He continues to be morbidly obese.  We discussed the importance of weight loss.  We discussed the possibility of evaluating him for sleep apnea.  He does not sleep well and often sleeps in a recliner and has significant pain from his back and foot.  He currently has Medicaid and will inquire if this can be covered.  He is maintaining sinus rhythm without recurrent AF.  He continues to be on Eliquis for anticoagulation and denies bleeding.  He  is on atorvastatin for hyperlipidemia.  With his diabetes mellitus, target LDL is less than 70.  He is diabetic on insulin.  I discussed the importance of some exercise at least 5 days/week for 30 minutes.  He has issues with erectile dysfunction and has requested a testosterone blood test.  I will add this to the laboratory that is to be done by his primary MD and I will also recheck lipid studies CBC and TSH which were not included on her request.  I will contact him regarding results.  As long as he is stable I will see him in 1 year for reevaluation.   Medication Adjustments/Labs and Tests Ordered: Current medicines are reviewed at length with the patient today.  Concerns regarding medicines are outlined above.  Medication changes, Labs and Tests ordered today are listed in the Patient Instructions below. Patient Instructions  Medication Instructions:  Your physician recommends that you continue on your current medications as directed. Please refer to the Current Medication list given to you today.  If you need a refill  on your cardiac medications before your next appointment, please call your pharmacy.   Lab work: Please return for FASTING labs (labs ordered by primary) + TSH, CBC, testosterone  Our in office lab hours are Monday-Friday 8:00-4:00, closed for lunch 12:45-1:45 pm.  No appointment needed.  If you have labs (blood work) drawn today and your tests are completely normal, you will receive your results only by: Marland Kitchen MyChart Message (if you have MyChart) OR . A paper copy in the mail If you have any lab test that is abnormal or we need to change your treatment, we will call you to review the results.  Follow-Up: At [facility], you and your health needs are our priority.  As part of our continuing mission to provide you with exceptional heart care, we have created designated Provider Care Teams.  These Care Teams include your primary Cardiologist (physician) and Advanced Practice Providers (APPs -  Physician Assistants and Nurse Practitioners) who all work together to provide you with the care you need, when you need it. You will need a follow up appointment in 1 years.  Please call our office 2 months in advance to schedule this appointment.  You may see Shelva Majestic, MD or one of the following Advanced Practice Providers on your designated Care Team: [place], [place] . Fabian Sharp, PA-C       Signed, Shelva Majestic, MD  01/21/2018 6:25 PM    [facility] [address], [place], [place], [place]  [address] Phone: [phone]

## 2018-01-20 NOTE — Patient Instructions (Signed)
Medication Instructions:  Your physician recommends that you continue on your current medications as directed. Please refer to the Current Medication list given to you today.  If you need a refill on your cardiac medications before your next appointment, please call your pharmacy.   Lab work: Please return for FASTING labs (labs ordered by primary) + TSH, CBC, testosterone  Our in office lab hours are Monday-Friday 8:00-4:00, closed for lunch 12:45-1:45 pm.  No appointment needed.  If you have labs (blood work) drawn today and your tests are completely normal, you will receive your results only by: Marland Kitchen MyChart Message (if you have MyChart) OR . A paper copy in the mail If you have any lab test that is abnormal or we need to change your treatment, we will call you to review the results.  Follow-Up: At [facility], you and your health needs are our priority.  As part of our continuing mission to provide you with exceptional heart care, we have created designated Provider Care Teams.  These Care Teams include your primary Cardiologist (physician) and Advanced Practice Providers (APPs -  Physician Assistants and Nurse Practitioners) who all work together to provide you with the care you need, when you need it. You will need a follow up appointment in 1 years.  Please call our office 2 months in advance to schedule this appointment.  You may see Shelva Majestic, MD or one of the following Advanced Practice Providers on your designated Care Team: [place], [place] . Fabian Sharp, PA-C

## 2018-01-21 ENCOUNTER — Encounter: Payer: Self-pay | Admitting: Cardiovascular Disease

## 2018-01-24 IMAGING — US US ABDOMEN LIMITED
1 series · 14 of 25 positions shown · non-contrast
Comparison: None.

CLINICAL DATA: Right upper quadrant pain for 1 month

EXAM:
US ABDOMEN LIMITED - RIGHT UPPER QUADRANT

[Series 1: us abdomen limited · 0.26mm/px · 14 of 50 slices shown]
[im 1/50]
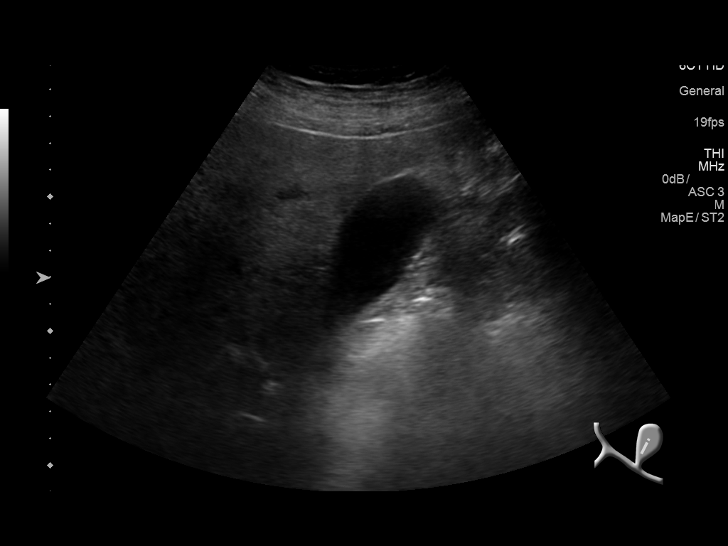
[im 5/50]
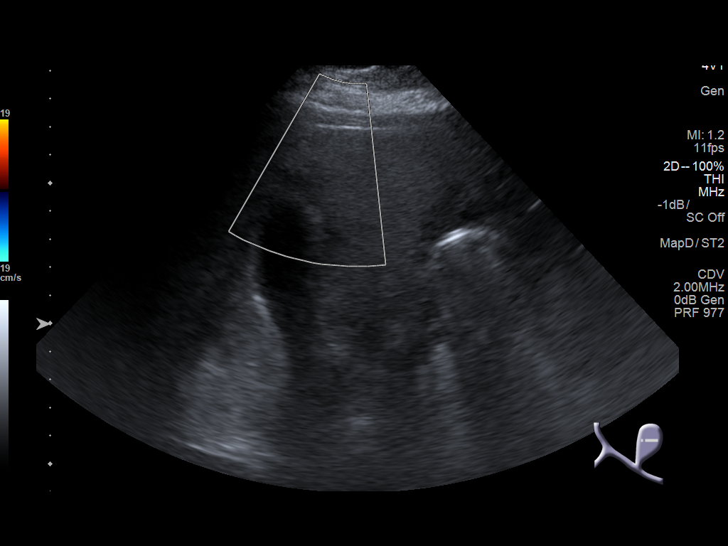
[im 9/50]
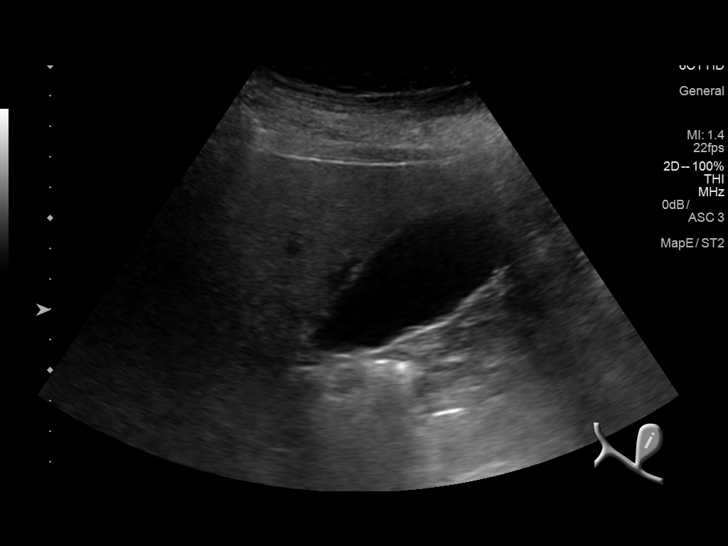
[im 13/50]
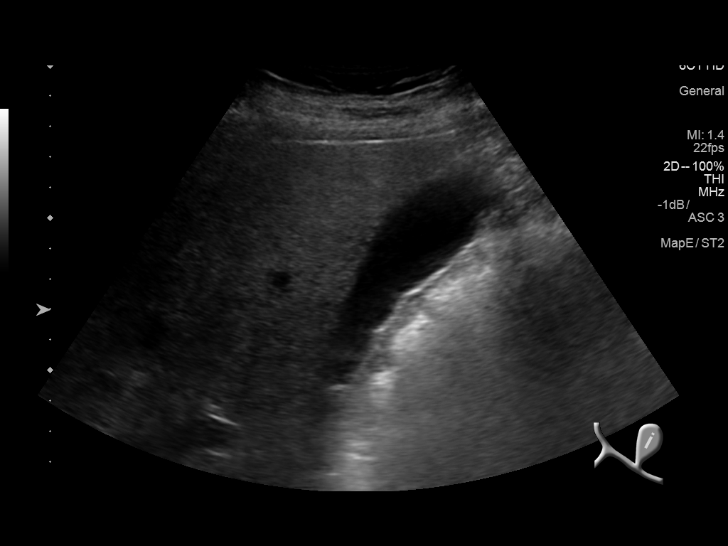
[im 17/50]
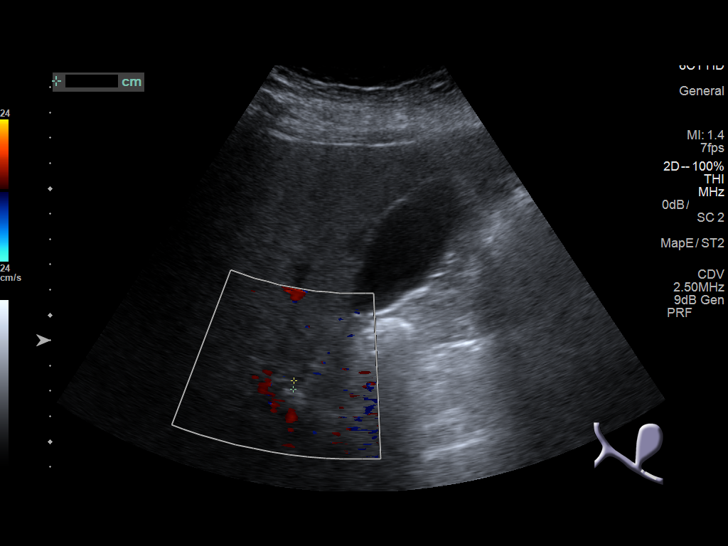
[im 19/50]
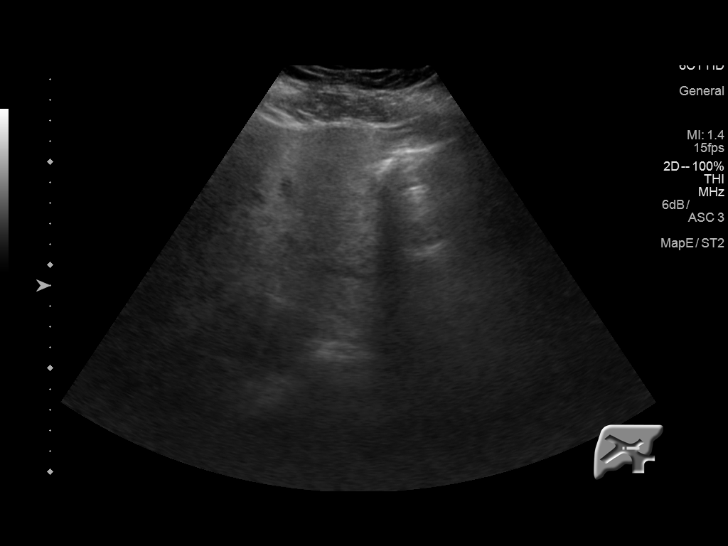
[im 23/50]
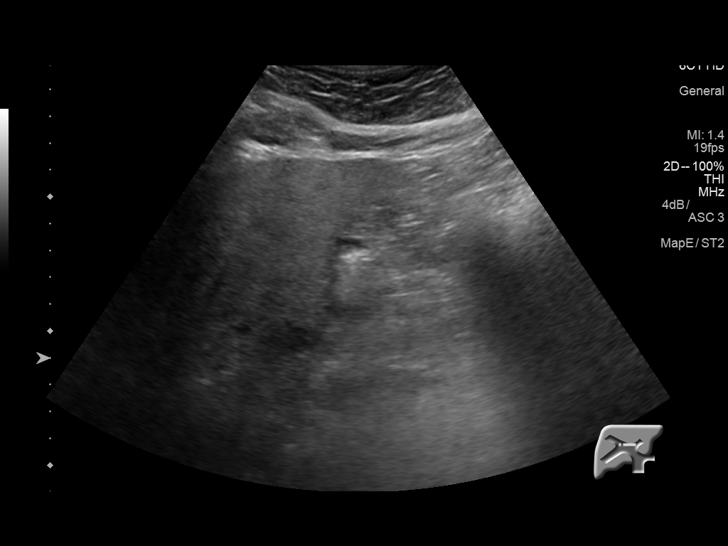
[im 27/50]
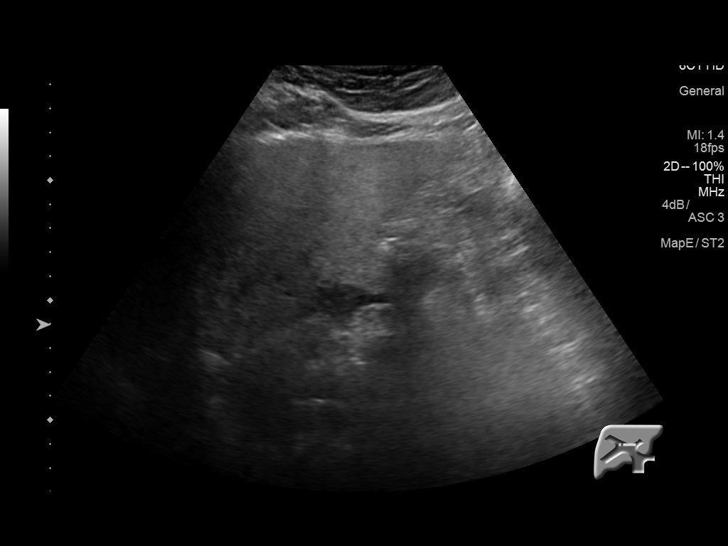
[im 31/50]
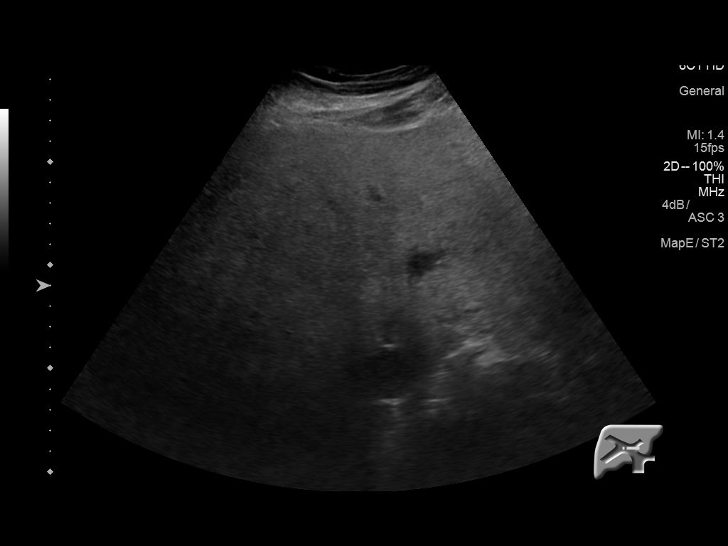
[im 33/50]
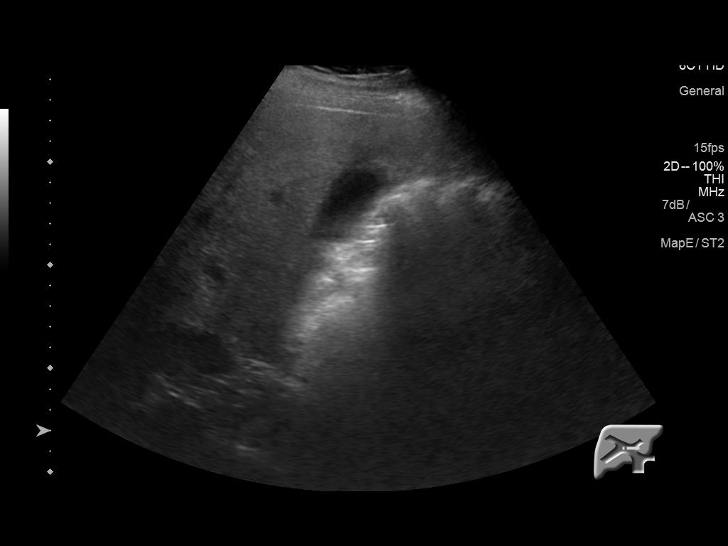
[im 37/50]
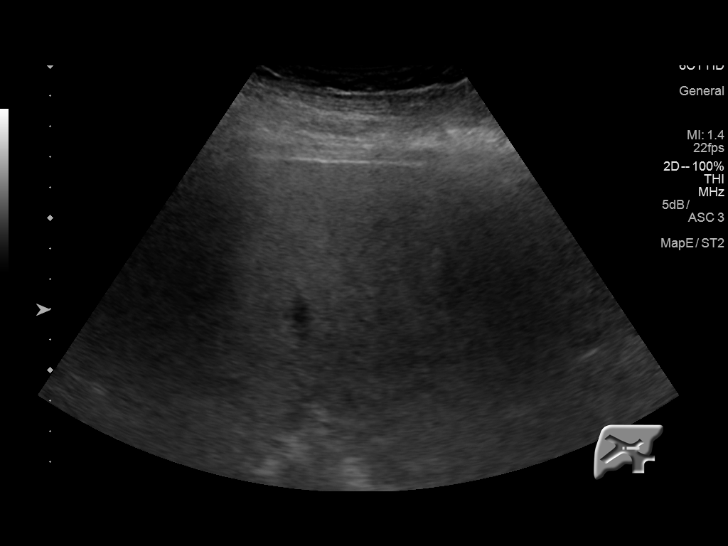
[im 41/50]
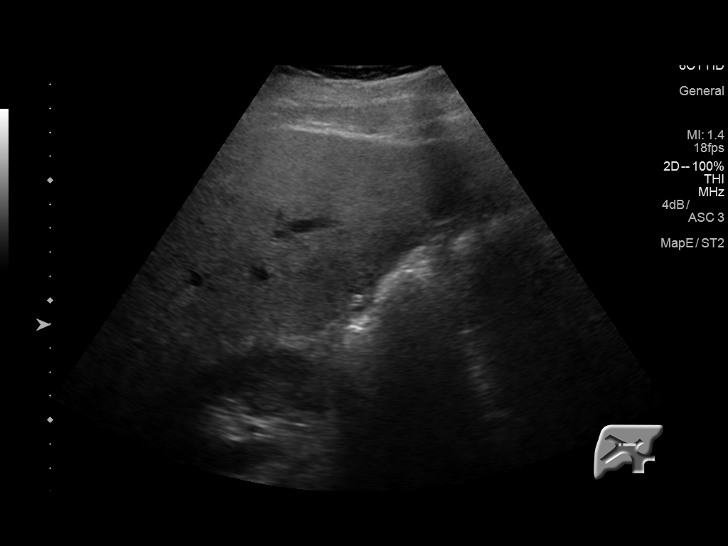
[im 45/50]
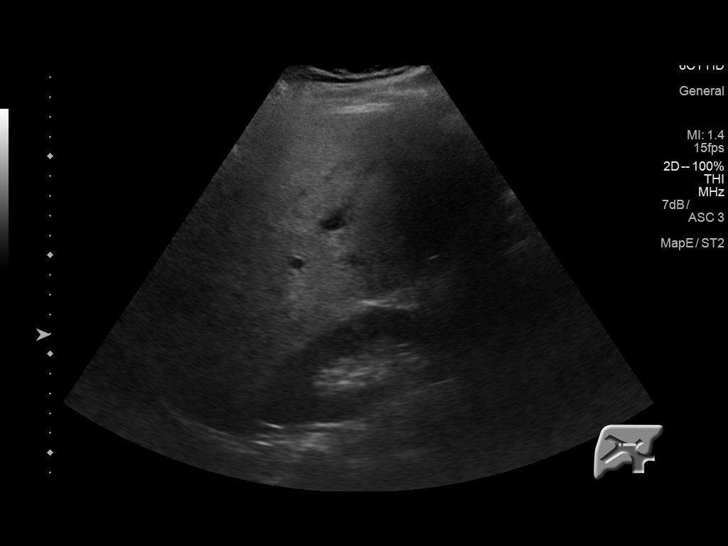
[im 50/50]
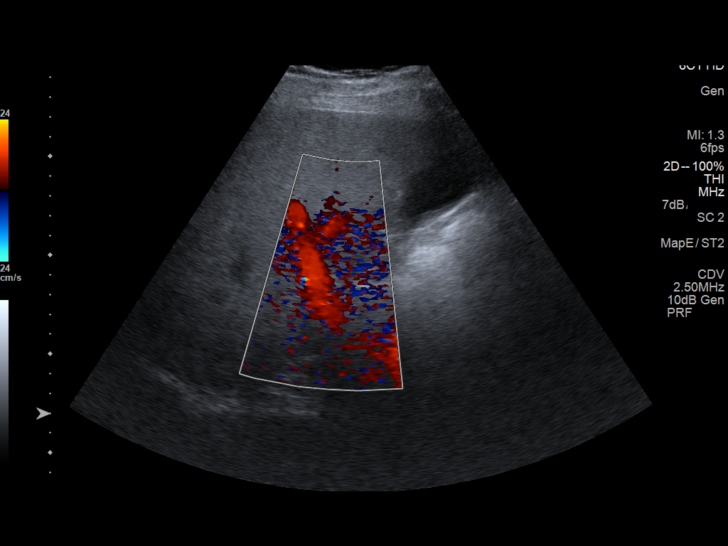

[14 of 25 positions shown; findings below may reference images not displayed]

FINDINGS: Gallbladder:

No gallstones or wall thickening visualized. No sonographic Murphy
sign noted by sonographer.

Common bile duct:

Diameter: 3.4 mm

Liver:

No focal lesion identified. Increased hepatic parenchymal
echogenicity.
IMPRESSION: 1. No cholelithiasis or sonographic evidence of acute cholecystitis.
2. Increased hepatic echogenicity as can be seen with hepatic
steatosis.

## 2018-01-31 DIAGNOSIS — E119 Type 2 diabetes mellitus without complications: Secondary | ICD-10-CM | POA: Diagnosis not present

## 2018-01-31 DIAGNOSIS — H2513 Age-related nuclear cataract, bilateral: Secondary | ICD-10-CM | POA: Diagnosis not present

## 2018-01-31 DIAGNOSIS — H5213 Myopia, bilateral: Secondary | ICD-10-CM | POA: Diagnosis not present

## 2018-01-31 LAB — HM DIABETES EYE EXAM

## 2018-02-02 ENCOUNTER — Telehealth (INDEPENDENT_AMBULATORY_CARE_PROVIDER_SITE_OTHER): Payer: Self-pay | Admitting: Orthopaedic Surgery

## 2018-02-02 NOTE — Telephone Encounter (Signed)
FYI

## 2018-02-02 NOTE — Telephone Encounter (Signed)
Patient called stating that his left shoulder is now hurting and wants Dr. Erlinda Hong to know so that he can look at it when he comes in on the 3rd.  GZ#[phone].  Thank you.

## 2018-02-03 ENCOUNTER — Other Ambulatory Visit: Payer: Self-pay | Admitting: Family Medicine

## 2018-02-04 DIAGNOSIS — M25511 Pain in right shoulder: Secondary | ICD-10-CM | POA: Diagnosis not present

## 2018-02-04 DIAGNOSIS — M545 Low back pain: Secondary | ICD-10-CM | POA: Diagnosis not present

## 2018-02-04 DIAGNOSIS — G894 Chronic pain syndrome: Secondary | ICD-10-CM | POA: Diagnosis not present

## 2018-02-04 DIAGNOSIS — M25512 Pain in left shoulder: Secondary | ICD-10-CM | POA: Diagnosis not present

## 2018-02-04 DIAGNOSIS — M25552 Pain in left hip: Secondary | ICD-10-CM | POA: Diagnosis not present

## 2018-02-17 DIAGNOSIS — Z7901 Long term (current) use of anticoagulants: Secondary | ICD-10-CM | POA: Diagnosis not present

## 2018-02-17 DIAGNOSIS — N529 Male erectile dysfunction, unspecified: Secondary | ICD-10-CM | POA: Diagnosis not present

## 2018-02-17 DIAGNOSIS — Z79899 Other long term (current) drug therapy: Secondary | ICD-10-CM | POA: Diagnosis not present

## 2018-02-17 DIAGNOSIS — I480 Paroxysmal atrial fibrillation: Secondary | ICD-10-CM | POA: Diagnosis not present

## 2018-02-17 DIAGNOSIS — I10 Essential (primary) hypertension: Secondary | ICD-10-CM | POA: Diagnosis not present

## 2018-02-17 LAB — CBC
Hematocrit: 42.8 % (ref 37.5–51.0)
Hemoglobin: 14.2 g/dL (ref 13.0–17.7)
MCH: 27.6 pg (ref 26.6–33.0)
MCHC: 33.2 g/dL (ref 31.5–35.7)
MCV: 83 fL (ref 79–97)
Platelets: 224 10*3/uL (ref 150–450)
RBC: 5.15 x10E6/uL (ref 4.14–5.80)
RDW: 13.1 % (ref 12.3–15.4)
WBC: 9.5 10*3/uL (ref 3.4–10.8)

## 2018-02-17 LAB — TSH: TSH: 3.4 u[IU]/mL (ref 0.450–4.500)

## 2018-02-17 LAB — TESTOSTERONE: Testosterone: 43 ng/dL — ABNORMAL LOW (ref 264–916)

## 2018-03-01 ENCOUNTER — Ambulatory Visit (INDEPENDENT_AMBULATORY_CARE_PROVIDER_SITE_OTHER): Payer: Self-pay

## 2018-03-01 ENCOUNTER — Ambulatory Visit (INDEPENDENT_AMBULATORY_CARE_PROVIDER_SITE_OTHER): Payer: Medicaid Other | Admitting: Orthopaedic Surgery

## 2018-03-01 ENCOUNTER — Encounter (INDEPENDENT_AMBULATORY_CARE_PROVIDER_SITE_OTHER): Payer: Self-pay | Admitting: Orthopaedic Surgery

## 2018-03-01 DIAGNOSIS — Z6841 Body Mass Index (BMI) 40.0 and over, adult: Secondary | ICD-10-CM

## 2018-03-01 DIAGNOSIS — G8929 Other chronic pain: Secondary | ICD-10-CM | POA: Diagnosis not present

## 2018-03-01 DIAGNOSIS — M25512 Pain in left shoulder: Secondary | ICD-10-CM | POA: Diagnosis not present

## 2018-03-01 DIAGNOSIS — Z96651 Presence of right artificial knee joint: Secondary | ICD-10-CM

## 2018-03-01 MED ORDER — BUPIVACAINE HCL 0.25 % IJ SOLN
2.0000 mL | INTRAMUSCULAR | Status: AC | PRN
  Administered 2018-03-01: 2 mL via INTRA_ARTICULAR

## 2018-03-01 MED ORDER — METHYLPREDNISOLONE ACETATE 40 MG/ML IJ SUSP
40.0000 mg | INTRAMUSCULAR | Status: AC | PRN
  Administered 2018-03-01: 40 mg via INTRA_ARTICULAR

## 2018-03-01 MED ORDER — LIDOCAINE HCL 2 % IJ SOLN
2.0000 mL | INTRAMUSCULAR | Status: AC | PRN
  Administered 2018-03-01: 2 mL

## 2018-03-01 NOTE — Progress Notes (Signed)
Office Visit Note   Patient: Jeffery [name]           Date of Birth: 1960-08-10           MRN: [id number] Visit Date: 03/01/2018              Requested by: [name], MD [place], [place] [address] PCP: [name], MD   Assessment & Plan: Visit Diagnoses:  1. Chronic left shoulder pain   2. S/P TKR (total knee replacement), right   3. Morbid obesity ([place])   4. Body mass index 40.0-44.9, adult (HCC)     Plan: Impression is status post right total knee replacement doing well.  #2, left shoulder rotator cuff tendinitis and AC arthropathy.  In regards to the right knee, he is doing well.  Follow-up with [place] as needed at this point.  In regards to the left shoulder, we will inject the subacromial space with cortisone.  He will give this at least 2 weeks before he calls to let us know how he is doing.  If he is not any better we will obtain an MRI of the left shoulder to further assess for structural abnormalities. The patient meets the AMA guidelines for Morbid (severe) obesity with a BMI > 40.0 and I have recommended weight loss.   Follow-Up Instructions: Return if symptoms worsen or fail to improve.   Orders:  Orders Placed This Encounter  Procedures  . Large Joint Inj: L subacromial bursa  . XR KNEE 3 VIEW RIGHT  . XR Shoulder Left   No orders of the defined types were placed in this encounter.     Procedures: Large Joint Inj: L subacromial bursa on 03/01/2018 1:27 PM Indications: pain Details: 22 G needle Medications: 2 mL lidocaine 2 %; 2 mL bupivacaine 0.25 %; 40 mg methylPREDNISolone acetate 40 MG/ML Outcome: tolerated well, no immediate complications Patient was prepped and draped in the usual sterile fashion.       Clinical Data: No additional findings.   Subjective: Chief Complaint  Patient presents with  . Right Shoulder - Pain, Follow-up  . Left Shoulder - Pain, Follow-up  . Right Knee - Pain    HPI patient is a  pleasant 57 year old gentleman who presents our clinic today for his 2-year follow-up from his right total knee replacement as well as new onset left shoulder pain.  In regards to the right knee, joint replacement surgery was on 03/28/2015.  He has been doing excellent with his right knee.  In regards to the left shoulder, this is been ongoing for the past 9 months.  No known injury or change in activity.  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  The pain he has is to the [facility] joint and into the deltoid.  He describes this as a burning sensation worse with forward flexion and internal rotation.  He is seen by a pain clinic where he takes narcotic pain medication on a daily basis.  No previous cortisone injection to the left shoulder.  He is status post right shoulder subacromial decompression and distal clavicle excision in December of this past year.  Doing very well with that.  Review of Systems as detailed in HPI.  All others reviewed and are negative.   Objective: Vital Signs: There were no vitals taken for this visit.  Physical Exam well-developed well-nourished gentleman no acute distress.  Alert and oriented  x3.  Ortho Exam examination of his right knee reveals range of motion 0 to 125 degrees.  Stable valgus varus stress.  Neurovascularly intact distally.  Examination of his left shoulder reveals marked tenderness to the [facility] joint.  Full forward flexion and abduction.  He can internally rotate to his back pocket.  Markedly positive cross body abduction and empty can.  Neurovascularly intact distally.  Specialty Comments:  No specialty comments available.  Imaging: Xr Knee 3 View Right  Result Date: 03/01/2018 X-rays of the right knee show a well-seated prosthesis without evidence of subsidence  Xr Shoulder Left  Result Date: 03/01/2018 X-rays demonstrate marked AC degenerative changes    PMFS History: Patient Active  Problem List   Diagnosis Date Noted  . Chronic left shoulder pain 03/01/2018  . Body mass index 40.0-44.9, adult ([place]) 03/01/2018  . DDD (degenerative disc disease), lumbar 12/15/2017  . Chronic anticoagulation 10/26/2017  . Dyspnea 10/26/2017  . S/P right rotator cuff repair 04/05/2017  . Achilles tendon contracture, right 01/05/2017  . Closed nondisplaced fracture of distal phalanx of right great toe 01/05/2017  . Paroxysmal atrial fibrillation ([place]) 10/30/2016  . Pain in right ankle and joints of right foot 07/09/2016  . Diabetic polyneuropathy associated with type 2 diabetes mellitus ([place]) 07/09/2016  . 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 Impingement syndrome of right shoulder 07/07/2016  . Morbid obesity ([place]) 06/05/2016  . S/P TKR (total knee replacement), right 03/28/2015  . Knee osteoarthritis 11/12/2014  . Hypertension 10/15/2014  . Osteomyelitis of toe of left foot ([place])   . Toe osteomyelitis, left ([place]) 10/01/2014  . Charcot foot due to diabetes mellitus ([place]) 10/01/2014  . Accelerated hypertension 10/01/2014  . Gangrene left third toe 10/01/2014   Past Medical History:  Diagnosis Date  . Atrial fibrillation ([place])   . Charcot's joint of right foot   . DDD (degenerative disc disease), lumbar   . Diabetes mellitus without complication ([place])   . Fatty liver   . Hypertension   . Obesity   . Rotator cuff disorder   . Shoulder impingement, right     Family History  Problem Relation Age of Onset  . Diabetes Father   . Hypertension Father   . Heart failure Father     Past Surgical History:  Procedure Laterality Date  . AMPUTATION Left 10/02/2014   Procedure: Left Third toe amputation ;  Surgeon: [name], MD;  Location: [place];  Service: Orthopedics;  Laterality: Left;  Regular bed, wants to follow hip  . ANTERIOR CRUCIATE LIGAMENT REPAIR Right 90   reconstruction  . APPLICATION OF WOUND VAC Left 10/02/2014    Procedure: APPLICATION OF WOUND VAC; toe Surgeon: [name], MD;  Location: [place];  Service: Orthopedics;  Laterality: Left;  . I&D EXTREMITY Left 10/05/2014   Procedure: IRRIGATION AND DEBRIDEMENT LEFT FOOT;  Surgeon: [name], MD;  Location: [place];  Service: Orthopedics;  Laterality: Left;  . KNEE ARTHROSCOPY W/ ACL RECONSTRUCTION Right   . TOTAL KNEE ARTHROPLASTY Right 03/28/2015  . TOTAL KNEE ARTHROPLASTY Right 03/28/2015   Procedure: RIGHT TOTAL KNEE ARTHROPLASTY;  Surgeon: [name], MD;  Location: [employer];  Service: Orthopedics;  Laterality: Right;   Social History   Occupational History  . Occupation: Management consultant  Tobacco Use  . Smoking status: Former Smoker    Packs/day: 0.00    Years: 38.00    Pack years: 0.00  Types: Cigarettes  . Smokeless tobacco: Never Used  Substance and Sexual Activity  . Alcohol use: No    Alcohol/week: 5.0 - 6.0 standard drinks    Types: 5 - 6 Cans of beer per week  . Drug use: Yes    Types: Marijuana    Comment: last week ; denies 03/24/17  . Sexual activity: Not on file

## 2018-03-03 DIAGNOSIS — G894 Chronic pain syndrome: Secondary | ICD-10-CM | POA: Diagnosis not present

## 2018-03-03 DIAGNOSIS — M25512 Pain in left shoulder: Secondary | ICD-10-CM | POA: Diagnosis not present

## 2018-03-03 DIAGNOSIS — M25511 Pain in right shoulder: Secondary | ICD-10-CM | POA: Diagnosis not present

## 2018-03-03 DIAGNOSIS — M25552 Pain in left hip: Secondary | ICD-10-CM | POA: Diagnosis not present

## 2018-03-03 DIAGNOSIS — M545 Low back pain: Secondary | ICD-10-CM | POA: Diagnosis not present

## 2018-03-03 DIAGNOSIS — M542 Cervicalgia: Secondary | ICD-10-CM | POA: Diagnosis not present

## 2018-03-14 ENCOUNTER — Ambulatory Visit (INDEPENDENT_AMBULATORY_CARE_PROVIDER_SITE_OTHER): Payer: Medicaid Other | Admitting: Orthopaedic Surgery

## 2018-03-14 ENCOUNTER — Telehealth (INDEPENDENT_AMBULATORY_CARE_PROVIDER_SITE_OTHER): Payer: Self-pay | Admitting: Orthopaedic Surgery

## 2018-03-14 NOTE — Telephone Encounter (Signed)
Patient [facility] requesting a call from Kathlee Nations regarding his left shoulder.

## 2018-03-15 ENCOUNTER — Ambulatory Visit (INDEPENDENT_AMBULATORY_CARE_PROVIDER_SITE_OTHER): Payer: Medicaid Other | Admitting: Orthopaedic Surgery

## 2018-03-15 NOTE — Telephone Encounter (Signed)
Called patient back. Patient states he is still having pain. Today mark 2 weeks after injection. He will call us to let us know when he would like [place] to put MRI Order in. He will see PCP tomorrow. Would like to get MRI after the Holidays sometime in Jan 2020.

## 2018-03-16 ENCOUNTER — Ambulatory Visit: Payer: Medicaid Other | Attending: Family Medicine | Admitting: Family Medicine

## 2018-03-16 ENCOUNTER — Encounter: Payer: Self-pay | Admitting: Family Medicine

## 2018-03-16 VITALS — BP 136/84 | HR 65 | Temp 97.4°F | Ht 75.0 in | Wt 332.2 lb

## 2018-03-16 DIAGNOSIS — Z794 Long term (current) use of insulin: Secondary | ICD-10-CM

## 2018-03-16 DIAGNOSIS — I10 Essential (primary) hypertension: Secondary | ICD-10-CM | POA: Diagnosis not present

## 2018-03-16 DIAGNOSIS — E1142 Type 2 diabetes mellitus with diabetic polyneuropathy: Secondary | ICD-10-CM

## 2018-03-16 DIAGNOSIS — G8929 Other chronic pain: Secondary | ICD-10-CM | POA: Insufficient documentation

## 2018-03-16 DIAGNOSIS — M5136 Other intervertebral disc degeneration, lumbar region: Secondary | ICD-10-CM | POA: Diagnosis not present

## 2018-03-16 DIAGNOSIS — E1165 Type 2 diabetes mellitus with hyperglycemia: Secondary | ICD-10-CM | POA: Diagnosis not present

## 2018-03-16 DIAGNOSIS — I480 Paroxysmal atrial fibrillation: Secondary | ICD-10-CM | POA: Insufficient documentation

## 2018-03-16 DIAGNOSIS — M5441 Lumbago with sciatica, right side: Secondary | ICD-10-CM | POA: Diagnosis not present

## 2018-03-16 DIAGNOSIS — E291 Testicular hypofunction: Secondary | ICD-10-CM | POA: Insufficient documentation

## 2018-03-16 DIAGNOSIS — Z79899 Other long term (current) drug therapy: Secondary | ICD-10-CM | POA: Diagnosis not present

## 2018-03-16 DIAGNOSIS — Z96651 Presence of right artificial knee joint: Secondary | ICD-10-CM | POA: Insufficient documentation

## 2018-03-16 DIAGNOSIS — Z9889 Other specified postprocedural states: Secondary | ICD-10-CM | POA: Diagnosis not present

## 2018-03-16 DIAGNOSIS — E669 Obesity, unspecified: Secondary | ICD-10-CM | POA: Diagnosis not present

## 2018-03-16 DIAGNOSIS — IMO0002 Reserved for concepts with insufficient information to code with codable children: Secondary | ICD-10-CM

## 2018-03-16 DIAGNOSIS — E349 Endocrine disorder, unspecified: Secondary | ICD-10-CM | POA: Diagnosis not present

## 2018-03-16 DIAGNOSIS — Z79891 Long term (current) use of opiate analgesic: Secondary | ICD-10-CM | POA: Diagnosis not present

## 2018-03-16 DIAGNOSIS — E11610 Type 2 diabetes mellitus with diabetic neuropathic arthropathy: Secondary | ICD-10-CM | POA: Insufficient documentation

## 2018-03-16 DIAGNOSIS — Z1159 Encounter for screening for other viral diseases: Secondary | ICD-10-CM | POA: Diagnosis not present

## 2018-03-16 DIAGNOSIS — Z7901 Long term (current) use of anticoagulants: Secondary | ICD-10-CM | POA: Diagnosis not present

## 2018-03-16 DIAGNOSIS — E118 Type 2 diabetes mellitus with unspecified complications: Secondary | ICD-10-CM

## 2018-03-16 DIAGNOSIS — M5442 Lumbago with sciatica, left side: Secondary | ICD-10-CM | POA: Insufficient documentation

## 2018-03-16 DIAGNOSIS — Z1211 Encounter for screening for malignant neoplasm of colon: Secondary | ICD-10-CM

## 2018-03-16 LAB — POCT GLYCOSYLATED HEMOGLOBIN (HGB A1C): Hemoglobin A1C: 9.2 % — AB (ref 4.0–5.6)

## 2018-03-16 LAB — GLUCOSE, POCT (MANUAL RESULT ENTRY): POC Glucose: 187 mg/dl — AB (ref 70–99)

## 2018-03-16 MED ORDER — METOPROLOL TARTRATE 100 MG PO TABS: 100.0000 mg | ORAL_TABLET | Freq: Two times a day (BID) | ORAL | 1 refills | Status: DC

## 2018-03-16 MED ORDER — LOSARTAN POTASSIUM 50 MG PO TABS: 50.0000 mg | ORAL_TABLET | Freq: Every day | ORAL | 1 refills | Status: DC

## 2018-03-16 MED ORDER — INSULIN GLARGINE 100 UNIT/ML SOLOSTAR PEN: 50.0000 [IU] | PEN_INJECTOR | Freq: Two times a day (BID) | SUBCUTANEOUS | 6 refills | Status: DC

## 2018-03-16 MED ORDER — GABAPENTIN 300 MG PO CAPS: 300.0000 mg | ORAL_CAPSULE | Freq: Every day | ORAL | 1 refills | Status: DC

## 2018-03-16 MED ORDER — APIXABAN 5 MG PO TABS: 5.0000 mg | ORAL_TABLET | Freq: Two times a day (BID) | ORAL | 6 refills | Status: DC

## 2018-03-16 MED ORDER — ATORVASTATIN CALCIUM 20 MG PO TABS: 20.0000 mg | ORAL_TABLET | Freq: Every day | ORAL | 1 refills | Status: DC

## 2018-03-16 MED ORDER — TESTOSTERONE CYPIONATE 200 MG/ML IM SOLN: 200.0000 mg | INTRAMUSCULAR | 3 refills | Status: DC

## 2018-03-16 NOTE — Progress Notes (Signed)
Subjective:  Patient ID: Jeffery [name], male    DOB: 04-16-1960  Age: 57 y.o. MRN: [id number]  CC: Diabetes   HPI Jeffery [name] is a 57 year old male with history of type 2 diabetes mellitus (A1c 9.2), hypertension, Charcot foot due to diabetes mellitus, Paroxysmal A. fib (currently on rate control with metoprolol and anticoagulation with Eliquis), status post right rotator cuff repair who presents today for follow-up visit. He complains of left shoulder pain and is status post cortisone injection 2 weeks ago by his orthopedics Dr Erlinda Hong with no relief in his symptoms.  He has a difficulty reaching behind him with his left upper extremity.  Pain is described as moderate to severe. For his chronic back pain he sees pain management and back pain is controlled. He recently saw his cardiologist Dr. Claiborne Billings on 01/20/2018 with no recommendations and change of management.  He has been compliant with his insulin but not with a diabetic diet or exercise.  Denies numbness in extremities, hypoglycemia or visual concerns. Tolerating his antihypertensive and doing well on his statin. Denies bleeding from [place] drawn by his cardiologist revealed testosterone deficiency with a testosterone of 43.  Past Medical History:  Diagnosis Date  . Atrial fibrillation ([place])   . Charcot's joint of right foot   . DDD (degenerative disc disease), lumbar   . Diabetes mellitus without complication ([place])   . Fatty liver   . Hypertension   . Obesity   . Rotator cuff disorder   . Shoulder impingement, right     Past Surgical History:  Procedure Laterality Date  . AMPUTATION Left 10/02/2014   Procedure: Left Third toe amputation ;  Surgeon: [name], MD;  Location: [place];  Service: Orthopedics;  Laterality: Left;  Regular bed, wants to follow hip  . ANTERIOR CRUCIATE LIGAMENT REPAIR Right 90   reconstruction  . APPLICATION OF WOUND VAC Left 10/02/2014   Procedure: APPLICATION OF WOUND VAC; toe  Surgeon: [name], MD;  Location: [place];  Service: Orthopedics;  Laterality: Left;  . I&D EXTREMITY Left 10/05/2014   Procedure: IRRIGATION AND DEBRIDEMENT LEFT FOOT;  Surgeon: [name], MD;  Location: [place];  Service: Orthopedics;  Laterality: Left;  . KNEE ARTHROSCOPY W/ ACL RECONSTRUCTION Right   . TOTAL KNEE ARTHROPLASTY Right 03/28/2015  . TOTAL KNEE ARTHROPLASTY Right 03/28/2015   Procedure: RIGHT TOTAL KNEE ARTHROPLASTY;  Surgeon: [name], MD;  Location: [place];  Service: Orthopedics;  Laterality: Right;    No Known Allergies   Outpatient Medications Prior to Visit  Medication Sig Dispense Refill  . Blood Glucose Monitoring Suppl (TRUE METRIX METER) DEVI 1 each by Does not apply route 3 (three) times daily before meals. 1 Device 0  . glucose blood (TRUE METRIX BLOOD GLUCOSE TEST) test strip Use 3 times daily before meals 100 each 12  . insulin lispro (HUMALOG) 100 UNIT/ML injection Inject 0.07 mLs (7 Units total) into the skin 3 (three) times daily before meals. 30 mL 6  . Insulin Pen Needle (B-D ULTRAFINE III SHORT PEN) 31G X 8 MM MISC 1 each by Does not apply route 3 (three) times daily. 100 each 5  . Insulin Syringe-Needle U-100 (TRUEPLUS INSULIN SYRINGE) 30G X 5/16" 0.5 ML MISC Use as directed 3 times daily 100 each 5  . oxyCODONE-acetaminophen (PERCOCET) 5-325 MG tablet 1-2 tabs po bid prn pain 30 tablet 0  . sodium chloride (OCEAN) 0.65 % SOLN nasal spray Place 1 spray  into both nostrils 2 (two) times daily as needed for congestion.    Marland Kitchen tiZANidine (ZANAFLEX) 4 MG tablet Take 1 tablet (4 mg total) by mouth every 6 (six) hours as needed for muscle spasms. 30 tablet 2  . TRUEPLUS INSULIN SYRINGE 30G X 5/16" 0.5 ML MISC USE AS DIRECTED 3 TIMES DAILY 100 each 0  . TRUEPLUS LANCETS 28G MISC 1 each by Does not apply route 3 (three) times daily before meals. 100 each 12  . atorvastatin (LIPITOR) 20 MG tablet Take 1 tablet (20 mg total) by mouth daily. 90 tablet 1  . ELIQUIS 5  MG TABS tablet TAKE ONE TABLET BY MOUTH TWICE A DAY 60 tablet 1  . gabapentin (NEURONTIN) 300 MG capsule Take 1 capsule (300 mg total) by mouth at bedtime. (Patient taking differently: Take 300 mg by mouth at bedtime as needed (foot tingling). ) 90 capsule 1  . Insulin Glargine (LANTUS SOLOSTAR) 100 UNIT/ML Solostar Pen Inject 45 Units into the skin 2 (two) times daily. 30 mL 6  . losartan (COZAAR) 50 MG tablet Take 1 tablet (50 mg total) by mouth daily. 90 tablet 1  . metoprolol tartrate (LOPRESSOR) 100 MG tablet Take 1 tablet (100 mg total) by mouth 2 (two) times daily. 180 tablet 1   No facility-administered medications prior to visit.     ROS Review of Systems  Constitutional: Negative for activity change and appetite change.  HENT: Negative for sinus pressure and sore throat.   Eyes: Negative for visual disturbance.  Respiratory: Negative for cough, chest tightness and shortness of breath.   Cardiovascular: Negative for chest pain and leg swelling.  Gastrointestinal: Negative for abdominal distention, abdominal pain, constipation and diarrhea.  Endocrine: Negative.   Genitourinary: Negative for dysuria.  Musculoskeletal:       See hpi  Skin: Negative for rash.  Allergic/Immunologic: Negative.   Neurological: Negative for weakness, light-headedness and numbness.  Psychiatric/Behavioral: Negative for dysphoric mood and suicidal ideas.    Objective:  BP 136/84   Pulse 65   Temp (!) 97.4 F (36.3 C) (Oral)   Ht 6\' 3"  (1.905 m)   Wt (!) 332 lb 3.2 oz (150.7 kg)   SpO2 99%   BMI 41.52 kg/m   BP/Weight 03/16/2018 01/20/2018 3/38/2505  Systolic BP 397 673 419  Diastolic BP 84 80 70  Wt. (Lbs) 332.2 333.2 329.8  BMI 41.52 41.65 41.22      Physical Exam Constitutional:      Appearance: He is well-developed.  Cardiovascular:     Rate and Rhythm: Normal rate.     Heart sounds: Normal heart sounds. No murmur.  Pulmonary:     Effort: Pulmonary effort is normal.      Breath sounds: Normal breath sounds. No wheezing or rales.  Chest:     Chest wall: No tenderness.  Abdominal:     General: Bowel sounds are normal. There is no distension.     Palpations: Abdomen is soft. There is no mass.     Tenderness: There is no abdominal tenderness.  Musculoskeletal:     Comments: Reduced range of motion in left upper extremity and full abduction achievable but associated with pain Right upper extremity range of motion is normal  Neurological:     Mental Status: He is alert and oriented to person, place, and time.    CMP Latest Ref Rng & Units 10/24/2017 04/15/2017 03/26/2017  Glucose 70 - 99 mg/dL 277(H) CANCELED 107(H)  BUN 6 - 20 mg/dL  15 CANCELED 12  Creatinine 0.61 - 1.24 mg/dL 1.13 CANCELED 0.73  Sodium 135 - 145 mmol/L 134(L) CANCELED 137  Potassium 3.5 - 5.1 mmol/L 4.6 CANCELED 4.7  Chloride 98 - 111 mmol/L 101 CANCELED 104  CO2 22 - 32 mmol/L 25 CANCELED 23  Calcium 8.9 - 10.3 mg/dL 9.4 CANCELED 9.0  Total Protein - - CANCELED 7.1  Total Bilirubin - - CANCELED 0.8  Alkaline Phos - - CANCELED 73  AST - - CANCELED 28  ALT - - CANCELED 23    Lipid Panel     Component Value Date/Time   CHOL CANCELED 04/15/2017 1022   TRIG CANCELED 04/15/2017 1022   HDL CANCELED 04/15/2017 1022   CHOLHDL 4.5 11/04/2016 0833   CHOLHDL 3.8 04/10/2010 0315   VLDL 32 04/10/2010 0315   LDLCALC 108 (H) 11/04/2016 0833    Lab Results  Component Value Date   HGBA1C 9.2 (A) 03/16/2018      Assessment & Plan:   1. Diabetic polyneuropathy associated with type 2 diabetes mellitus ([place]) Uncontrolled with A1c of 9.2 Increase Lantus dose Diabetic diet, lifestyle modifications - POCT glucose (manual entry) - POCT glycosylated hemoglobin (Hb A1C) - Lipid panel; Future  2. Uncontrolled type 2 diabetes mellitus with complication, with long-term current use of insulin ([place]) See #1 above - Insulin Glargine (LANTUS SOLOSTAR) 100 UNIT/ML Solostar Pen; Inject 50 Units  into the skin 2 (two) times daily.  Dispense: 30 mL; Refill: 6 - atorvastatin (LIPITOR) 20 MG tablet; Take 1 tablet (20 mg total) by mouth daily.  Dispense: 90 tablet; Refill: 1  3. Chronic midline low back pain with bilateral sciatica Followed by pain management - gabapentin (NEURONTIN) 300 MG capsule; Take 1 capsule (300 mg total) by mouth at bedtime.  Dispense: 90 capsule; Refill: 1  4. Essential hypertension Controlled Low-sodium diet - losartan (COZAAR) 50 MG tablet; Take 1 tablet (50 mg total) by mouth daily.  Dispense: 90 tablet; Refill: 1 - metoprolol tartrate (LOPRESSOR) 100 MG tablet; Take 1 tablet (100 mg total) by mouth 2 (two) times daily.  Dispense: 180 tablet; Refill: 1  5. Screening for colon cancer - Ambulatory referral to Gastroenterology  6. Screening for viral disease - Hepatitis c antibody (reflex); Future  7. Testosterone deficiency Discussed benefits, risks and adverse effects of testosterone replacement and he wishes to proceed - PSA, total and free; Future - CBC with Differential/Platelet; Future - Testosterone, Free, Total, SHBG; Future   Meds ordered this encounter  Medications  . Insulin Glargine (LANTUS SOLOSTAR) 100 UNIT/ML Solostar Pen    Sig: Inject 50 Units into the skin 2 (two) times daily.    Dispense:  30 mL    Refill:  6  . atorvastatin (LIPITOR) 20 MG tablet    Sig: Take 1 tablet (20 mg total) by mouth daily.    Dispense:  90 tablet    Refill:  1  . apixaban (ELIQUIS) 5 MG TABS tablet    Sig: Take 1 tablet (5 mg total) by mouth 2 (two) times daily.    Dispense:  60 tablet    Refill:  6  . gabapentin (NEURONTIN) 300 MG capsule    Sig: Take 1 capsule (300 mg total) by mouth at bedtime.    Dispense:  90 capsule    Refill:  1  . losartan (COZAAR) 50 MG tablet    Sig: Take 1 tablet (50 mg total) by mouth daily.    Dispense:  90 tablet    Refill:  1  . metoprolol tartrate (LOPRESSOR) 100 MG tablet    Sig: Take 1 tablet (100 mg total) by  mouth 2 (two) times daily.    Dispense:  180 tablet    Refill:  1  . testosterone cypionate (DEPOTESTOSTERONE CYPIONATE) 200 MG/ML injection    Sig: Inject 1 mL (200 mg total) into the muscle every 28 (twenty-eight) days.    Dispense:  10 mL    Refill:  3    Follow-up: Return in about 3 months (around 06/15/2018) for follow up of chronic medical conditions; 03/31/18 for nurse visit -testosterone shot.   [name] MD

## 2018-03-18 ENCOUNTER — Other Ambulatory Visit (INDEPENDENT_AMBULATORY_CARE_PROVIDER_SITE_OTHER): Payer: Self-pay

## 2018-03-18 ENCOUNTER — Telehealth (INDEPENDENT_AMBULATORY_CARE_PROVIDER_SITE_OTHER): Payer: Self-pay | Admitting: Orthopaedic Surgery

## 2018-03-18 DIAGNOSIS — G8929 Other chronic pain: Secondary | ICD-10-CM

## 2018-03-18 DIAGNOSIS — M25512 Pain in left shoulder: Principal | ICD-10-CM

## 2018-03-18 NOTE — Telephone Encounter (Signed)
Called patient he is aware and someone will call him to make appt for MRI.

## 2018-03-18 NOTE — Telephone Encounter (Signed)
Patient called stating he wanted a MRI and wants you to call him. We all have had several calls from this patient about an appt and he was offered 1st available. He refused because he states it was too long.  Please call patient back to advise.[phone]

## 2018-03-18 NOTE — Telephone Encounter (Signed)
All he wanted was for [place] to put an MRI order in. Okay per Dr Phoebe Sharps previous notes.  MRI order made.

## 2018-04-01 DIAGNOSIS — M25552 Pain in left hip: Secondary | ICD-10-CM | POA: Diagnosis not present

## 2018-04-01 DIAGNOSIS — G894 Chronic pain syndrome: Secondary | ICD-10-CM | POA: Diagnosis not present

## 2018-04-01 DIAGNOSIS — M25512 Pain in left shoulder: Secondary | ICD-10-CM | POA: Diagnosis not present

## 2018-04-01 DIAGNOSIS — M25511 Pain in right shoulder: Secondary | ICD-10-CM | POA: Diagnosis not present

## 2018-04-04 ENCOUNTER — Ambulatory Visit: Payer: Medicaid Other | Attending: Family Medicine

## 2018-04-04 DIAGNOSIS — E118 Type 2 diabetes mellitus with unspecified complications: Secondary | ICD-10-CM | POA: Diagnosis not present

## 2018-04-04 DIAGNOSIS — Z1159 Encounter for screening for other viral diseases: Secondary | ICD-10-CM | POA: Diagnosis not present

## 2018-04-04 DIAGNOSIS — E1142 Type 2 diabetes mellitus with diabetic polyneuropathy: Secondary | ICD-10-CM | POA: Diagnosis not present

## 2018-04-04 DIAGNOSIS — E349 Endocrine disorder, unspecified: Secondary | ICD-10-CM

## 2018-04-04 DIAGNOSIS — E1165 Type 2 diabetes mellitus with hyperglycemia: Secondary | ICD-10-CM | POA: Diagnosis not present

## 2018-04-04 DIAGNOSIS — Z794 Long term (current) use of insulin: Secondary | ICD-10-CM

## 2018-04-04 DIAGNOSIS — IMO0002 Reserved for concepts with insufficient information to code with codable children: Secondary | ICD-10-CM

## 2018-04-05 LAB — CBC WITH DIFFERENTIAL/PLATELET
Basophils Absolute: 0.1 10*3/uL (ref 0.0–0.2)
Basos: 1 %
EOS (ABSOLUTE): 0.4 10*3/uL (ref 0.0–0.4)
Eos: 4 %
Hematocrit: 47.4 % (ref 37.5–51.0)
Hemoglobin: 15.5 g/dL (ref 13.0–17.7)
Immature Grans (Abs): 0.1 10*3/uL (ref 0.0–0.1)
Immature Granulocytes: 1 %
Lymphocytes Absolute: 2.3 10*3/uL (ref 0.7–3.1)
Lymphs: 23 %
MCH: 28.4 pg (ref 26.6–33.0)
MCHC: 32.7 g/dL (ref 31.5–35.7)
MCV: 87 fL (ref 79–97)
Monocytes Absolute: 0.8 10*3/uL (ref 0.1–0.9)
Monocytes: 8 %
Neutrophils Absolute: 6.4 10*3/uL (ref 1.4–7.0)
Neutrophils: 63 %
Platelets: 237 10*3/uL (ref 150–450)
RBC: 5.46 x10E6/uL (ref 4.14–5.80)
RDW: 13.3 % (ref 11.6–15.4)
WBC: 10 10*3/uL (ref 3.4–10.8)

## 2018-04-05 LAB — PSA, TOTAL AND FREE
PSA, Free Pct: >30 %
PSA, Free: 0.03 ng/mL
Prostate Specific Ag, Serum: <0.1 ng/mL (ref 0.0–4.0)

## 2018-04-05 LAB — TESTOSTERONE, FREE, TOTAL, SHBG
Sex Hormone Binding: 40.4 nmol/L (ref 19.3–76.4)
Testosterone, Free: 3.4 pg/mL — ABNORMAL LOW (ref 7.2–24.0)
Testosterone: 82 ng/dL — ABNORMAL LOW (ref 264–916)

## 2018-04-05 LAB — HEPATITIS C ANTIBODY (REFLEX): HCV Ab: 0.1 s/co ratio (ref 0.0–0.9)

## 2018-04-05 LAB — HCV COMMENT:

## 2018-04-05 LAB — MICROALBUMIN / CREATININE URINE RATIO
Creatinine, Urine: 221.6 mg/dL
Microalb/Creat Ratio: 13.9 mg/g creat (ref 0.0–30.0)
Microalbumin, Urine: 30.8 ug/mL

## 2018-04-05 LAB — LIPID PANEL
Chol/HDL Ratio: 3.7 ratio (ref 0.0–5.0)
Cholesterol, Total: 157 mg/dL (ref 100–199)
HDL: 42 mg/dL (ref >39)
LDL Calculated: 64 mg/dL (ref 0–99)
Triglycerides: 256 mg/dL — ABNORMAL HIGH (ref 0–149)
VLDL Cholesterol Cal: 51 mg/dL — ABNORMAL HIGH (ref 5–40)

## 2018-04-07 ENCOUNTER — Telehealth: Payer: Self-pay

## 2018-04-07 NOTE — Telephone Encounter (Signed)
-----   Message from [name], MD sent at 04/05/2018 10:02 AM EST ----- Testosterone level is low. He should  Have scheduled an appointment to receive his testosterone injections. PSA is normal. Total cholesterol is normal but triglycerides which is a type of cholesterol is elevated. Advise to work on low cholesterol diet and weight loss.

## 2018-04-07 NOTE — Telephone Encounter (Signed)
Patient was called and informed of lab results. 

## 2018-04-12 ENCOUNTER — Encounter (INDEPENDENT_AMBULATORY_CARE_PROVIDER_SITE_OTHER): Payer: Self-pay | Admitting: Orthopaedic Surgery

## 2018-04-12 ENCOUNTER — Ambulatory Visit (INDEPENDENT_AMBULATORY_CARE_PROVIDER_SITE_OTHER): Payer: Medicaid Other | Admitting: Orthopaedic Surgery

## 2018-04-12 DIAGNOSIS — G8929 Other chronic pain: Secondary | ICD-10-CM

## 2018-04-12 DIAGNOSIS — M25512 Pain in left shoulder: Secondary | ICD-10-CM

## 2018-04-12 NOTE — Progress Notes (Signed)
Office Visit Note   Patient: Jeffery [name]           Date of Birth: November 23, 1960           MRN: [id number] Visit Date: 04/12/2018              Requested by: [name], MD [place],  [id number] PCP: [name], MD   Assessment & Plan: Visit Diagnoses:  1. Chronic left shoulder pain     Plan: Impression is left shoulder rotator cuff tendinitis.  We will try a course of formal physical therapy for the next 6 weeks.  He will follow-up with [place] after that.  If he has not any better, we will obtain an MRI to assess his rotator cuff.  Follow-Up Instructions: Return in about 6 weeks (around 05/24/2018).   Orders:  No orders of the defined types were placed in this encounter.  No orders of the defined types were placed in this encounter.     Procedures: No procedures performed   Clinical Data: No additional findings.   Subjective: Chief Complaint  Patient presents with  . Left Shoulder - Follow-up, Pain    HPI patient is a pleasant 58 year old gentleman who presents to our clinic today with recurrent left shoulder pain.  This is been ongoing for a little under a year now.  He was seen in our office about a month ago where we proceeded with a subacromial cortisone injection.  He noticed minimal relief of symptoms following the injection.  He continues to have pain worse with forward flexion and internal rotation.  This occurs to the entire shoulder and radiates into the deltoid.  He denies any radicular symptoms.  Review of Systems as detailed in HPI.  All others reviewed and are negative.   Objective: Vital Signs: There were no vitals taken for this visit.  Physical Exam well-developed and well-nourished gentleman in no acute distress.  Alert and oriented x3.  Ortho Exam examination of the left shoulder reveals 80% active range of motion all planes.  He can internally rotate to his back pocket.  Markedly positive empty can.  No  tenderness over the [facility] joint.  He is neurovascularly intact distally.  Specialty Comments:  No specialty comments available.  Imaging: No new imaging   PMFS History: Patient Active Problem List   Diagnosis Date Noted  . Testosterone deficiency 03/16/2018  . Chronic left shoulder pain 03/01/2018  . Body mass index 40.0-44.9, adult ([place]) 03/01/2018  . DDD (degenerative disc disease), lumbar 12/15/2017  . Chronic anticoagulation 10/26/2017  . Dyspnea 10/26/2017  . S/P right rotator cuff repair 04/05/2017  . Achilles tendon contracture, right 01/05/2017  . Closed nondisplaced fracture of distal phalanx of right great toe 01/05/2017  . Paroxysmal atrial fibrillation ([place]) 10/30/2016  . Pain in right ankle and joints of right foot 07/09/2016  . Diabetic polyneuropathy associated with type 2 diabetes mellitus ([place]) 07/09/2016  . 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 Impingement syndrome of right shoulder 07/07/2016  . Morbid obesity ([place]) 06/05/2016  . S/P TKR (total knee replacement), right 03/28/2015  . Knee osteoarthritis 11/12/2014  . Hypertension 10/15/2014  . Charcot foot due to diabetes mellitus ([place]) 10/01/2014  . Accelerated hypertension 10/01/2014  . Gangrene left third toe 10/01/2014   Past Medical History:  Diagnosis Date  . Atrial fibrillation ([place])   . Charcot's joint of right foot   . DDD (degenerative disc disease),  lumbar   . Diabetes mellitus without complication ([place])   . Fatty liver   . Hypertension   . Obesity   . Rotator cuff disorder   . Shoulder impingement, right     Family History  Problem Relation Age of Onset  . Diabetes Father   . Hypertension Father   . Heart failure Father     Past Surgical History:  Procedure Laterality Date  . AMPUTATION Left 10/02/2014   Procedure: Left Third toe amputation ;  Surgeon: [name], MD;  Location: [facility];  Service: Orthopedics;  Laterality: Left;   Regular bed, wants to follow hip  . ANTERIOR CRUCIATE LIGAMENT REPAIR Right 90   reconstruction  . APPLICATION OF WOUND VAC Left 10/02/2014   Procedure: APPLICATION OF WOUND VAC; toe Surgeon: [name], MD;  Location: [facility];  Service: Orthopedics;  Laterality: Left;  . I&D EXTREMITY Left 10/05/2014   Procedure: IRRIGATION AND DEBRIDEMENT LEFT FOOT;  Surgeon: [name], MD;  Location: [facility];  Service: Orthopedics;  Laterality: Left;  . KNEE ARTHROSCOPY W/ ACL RECONSTRUCTION Right   . TOTAL KNEE ARTHROPLASTY Right 03/28/2015  . TOTAL KNEE ARTHROPLASTY Right 03/28/2015   Procedure: RIGHT TOTAL KNEE ARTHROPLASTY;  Surgeon: [name], MD;  Location: [facility];  Service: Orthopedics;  Laterality: Right;   Social History   Occupational History  . Occupation: Management consultant  Tobacco Use  . Smoking status: Former Smoker    Packs/day: 0.00    Years: 38.00    Pack years: 0.00    Types: Cigarettes  . Smokeless tobacco: Never Used  Substance and Sexual Activity  . Alcohol use: No    Alcohol/week: 5.0 - 6.0 standard drinks    Types: 5 - 6 Cans of beer per week  . Drug use: Yes    Types: Marijuana    Comment: last week ; denies 03/24/17  . Sexual activity: Not on file

## 2018-04-13 ENCOUNTER — Telehealth (INDEPENDENT_AMBULATORY_CARE_PROVIDER_SITE_OTHER): Payer: Self-pay | Admitting: Orthopaedic Surgery

## 2018-04-13 NOTE — Telephone Encounter (Signed)
I called and gave the patient the codes: M25.512 and G89.29.

## 2018-04-13 NOTE — Telephone Encounter (Signed)
Patient called and stated need PT and the code for [place].  Please call patient to advise [phone]

## 2018-04-14 ENCOUNTER — Other Ambulatory Visit: Payer: Self-pay | Admitting: Family Medicine

## 2018-04-14 ENCOUNTER — Ambulatory Visit: Payer: Medicaid Other | Attending: Orthopaedic Surgery | Admitting: Physical Therapy

## 2018-04-14 ENCOUNTER — Encounter: Payer: Self-pay | Admitting: Physical Therapy

## 2018-04-14 ENCOUNTER — Other Ambulatory Visit: Payer: Self-pay

## 2018-04-14 DIAGNOSIS — M25612 Stiffness of left shoulder, not elsewhere classified: Secondary | ICD-10-CM | POA: Insufficient documentation

## 2018-04-14 DIAGNOSIS — G8929 Other chronic pain: Secondary | ICD-10-CM | POA: Diagnosis not present

## 2018-04-14 DIAGNOSIS — M6281 Muscle weakness (generalized): Secondary | ICD-10-CM | POA: Diagnosis not present

## 2018-04-14 DIAGNOSIS — M25512 Pain in left shoulder: Secondary | ICD-10-CM | POA: Diagnosis not present

## 2018-04-14 NOTE — Therapy (Signed)
[facility] [address] [place], [place], [address] Phone: [phone]   Fax:  [phone]  Physical Therapy Evaluation  Patient Details  Name: Jeffery [name] MRN: [id number] Date of Birth: Feb 10, 1961 Referring Provider (PT): Frankey Shown, MD    Encounter Date: 04/14/2018  PT End of Session - 04/14/18 1619    Visit Number  1    Date for PT Re-Evaluation  06/13/18    Authorization Type  Medicaid    Authorization Time Period  04/14/18 to 06/13/18    PT Start Time  1531    PT Stop Time  1606    PT Time Calculation (min)  35 min    Activity Tolerance  Patient tolerated treatment well;No increased pain    Behavior During Therapy  WFL for tasks assessed/performed       Past Medical History:  Diagnosis Date  . Atrial fibrillation ([place])   . Charcot's joint of right foot   . DDD (degenerative disc disease), lumbar   . Diabetes mellitus without complication ([place])   . Fatty liver   . Hypertension   . Obesity   . Rotator cuff disorder   . Shoulder impingement, right     Past Surgical History:  Procedure Laterality Date  . AMPUTATION Left 10/02/2014   Procedure: Left Third toe amputation ;  Surgeon: [name], MD;  Location: [place];  Service: Orthopedics;  Laterality: Left;  Regular bed, wants to follow hip  . ANTERIOR CRUCIATE LIGAMENT REPAIR Right 90   reconstruction  . APPLICATION OF WOUND VAC Left 10/02/2014   Procedure: APPLICATION OF WOUND VAC; toe Surgeon: [name], MD;  Location: [place];  Service: Orthopedics;  Laterality: Left;  . I&D EXTREMITY Left 10/05/2014   Procedure: IRRIGATION AND DEBRIDEMENT LEFT FOOT;  Surgeon: [name], MD;  Location: [place];  Service: Orthopedics;  Laterality: Left;  . KNEE ARTHROSCOPY W/ ACL RECONSTRUCTION Right   . TOTAL KNEE ARTHROPLASTY Right 03/28/2015  . TOTAL KNEE ARTHROPLASTY Right 03/28/2015   Procedure: RIGHT TOTAL KNEE ARTHROPLASTY;  Surgeon: [name], MD;   Location: [place];  Service: Orthopedics;  Laterality: Right;    There were no vitals filed for this visit.   Subjective Assessment - 04/14/18 1535    Subjective  Pt states that he fell and injured his Rt shoulder many years ago. He was able to have surgery on the Rt shoulder end of 2018. His Lt shoulder is now bothering him. This started 7-8 months ago and he can't sleep on the shoulder without it waking him up. He has tried injections without relief. He also tried a steroid pack without any relief.     Pertinent History  Rt and Lt TKA, Charcot's jt on Rt, DDD lumbar spine, HTN    Limitations  Lifting;House hold activities    Patient Stated Goals  decrease pain in shoulder     Currently in Pain?  Yes    Pain Score  6     Pain Location  Shoulder    Pain Orientation  Left    Pain Descriptors / Indicators  Stabbing    Pain Type  Chronic pain    Pain Radiating Towards  none     Pain Onset  More than a month ago    Pain Frequency  Constant    Aggravating Factors   sleeping on the Lt side, using the Lt arm and reaching overhead     Pain Relieving  Factors  avoiding shoulder movements    Effect of Pain on Daily Activities  difficulty using Lt arm         [facility] PT Assessment - 04/14/18 0001      Assessment   Medical Diagnosis  Lt rotator cuff tendonitis    Referring Provider (PT)  Frankey Shown, MD     Onset Date/Surgical Date  --   8 months ago   Hand Dominance  Right    Next MD Visit  05/24/2018    Prior Therapy  none for Lt shoulder       Balance Screen   Has the patient fallen in the past 6 months  No    Has the patient had a decrease in activity level because of a fear of falling?   No    Is the patient reluctant to leave their home because of a fear of falling?   No      Cognition   Overall Cognitive Status  Within Functional Limits for tasks assessed      Sensation   Additional Comments  pt denies numbness/tingling      ROM / Strength   AROM / PROM / Strength   AROM;Strength;PROM      AROM   AROM Assessment Site  Shoulder    Right/Left Shoulder  Right;Left    Left Shoulder Flexion  125 Degrees    Left Shoulder ABduction  125 Degrees   pain 90 deg    Left Shoulder Internal Rotation  --   reach behind back: (+) pain, S1   Left Shoulder External Rotation  --   reach behind head: (+) pain, C5     PROM   PROM Assessment Site  Shoulder    Right/Left Shoulder  Right;Left    Left Shoulder Internal Rotation  20 Degrees   90 deg abd, (+) pain    Left Shoulder External Rotation  80 Degrees   (-) pain, 90 deg abduction     Strength   Strength Assessment Site  Shoulder    Right/Left Shoulder  Right;Left    Right Shoulder Flexion  4/5    Right Shoulder ABduction  4/5    Left Shoulder Flexion  4/5    Left Shoulder ABduction  3/5   (+) pain   Left Shoulder Internal Rotation  4/5    Left Shoulder External Rotation  3/5   (+) pain     Palpation   Palpation comment  difficult to assess pt's tenderness to palpation of Lt shoulder; pt talking during assessment and indicated no soreness/pain                Objective measurements completed on examination: See above findings.      [facility] Adult PT Treatment/Exercise - 04/14/18 0001      Manual Therapy   Manual therapy comments  Lt shoulder IR MWM 2x10 reps (increased ro 35 deg)             PT Education - 04/14/18 1618    Education Details  eval findings/POC; benefits of PT     Person(s) Educated  Patient    Methods  Explanation    Comprehension  Verbalized understanding       PT Short Term Goals - 04/14/18 1700      PT SHORT TERM GOAL #1   Title  Pt will be independent with his initial HEP to decrease pain and improve ROM of the shoulder.    Time  4  Period  Weeks    Status  New    Target Date  05/15/18      PT SHORT TERM GOAL #2   Title  Pt will have increased Lt shoulder passive internal rotation to atleast 60 deg at 90 deg abduction which will assist with  shoulder use throughout the day.     Time  4    Period  Weeks        PT Long Term Goals - 04/14/18 1701      PT LONG TERM GOAL #1   Title  Pt will report atleast 60% improvement in his Lt shoulder pain from the start of PT to increase his activity participation throughout the day.     Time  8    Period  Weeks    Status  New      PT LONG TERM GOAL #2   Title  Pt will have atleast 4/5 strength (pain free) of the Lt UE which will improve his efficiency with daily tasks.    Time  8    Period  Weeks    Status  New      PT LONG TERM GOAL #3   Title  Pt will report being able to sleep through the night atleast 4 days out of the week without increase in Lt shoulder pain.     Time  8    Period  Weeks    Status  New      PT LONG TERM GOAL #4   Title  Pt will have increased Lt shoulder active flexion ROM to atleast 160 deg without increase in pain which will allow him to reach overhead into a cabinet.     Time  8    Period  Weeks    Status  New             Plan - 04/14/18 1708    Clinical Impression Statement  Pt is a pleasant 58 y.o M referred to OPPT with chronic Lt shoulder pain and stiffness onset insidiously over 7-8 months ago. He demonstrates limitations in active shoulder elevation to approximately 125 deg as well as passive shoulder internal rotation limited to 20 deg in the scapular plane. Pt has shoulder weakness and pain with flexion and external rotation on today's examination, however palpation of the shoulder and rotator cuff musculature revealed no noted tenderness or increase in pain. Pt's shoulder weakness and stiffness also result in functional limitations in his ability to reach behind the back and head. He would benefit from skilled PT to address limitations in ROM, strength and shoulder mechanics to improve his pain and participation in daily activity.     History and Personal Factors relevant to plan of care:  Rt and Lt TKA, Rt RTC repair, DDD lumbar region,  Charcot's jt Rt foot     Clinical Presentation  Stable    Clinical Decision Making  Low    Rehab Potential  Good    PT Frequency  2x / week   Pt visits limited due to insurance   PT Duration  8 weeks    PT Treatment/Interventions  ADLs/Self Care Home Management;Cryotherapy;Iontophoresis 4mg /ml Dexamethasone;Moist Heat;Therapeutic exercise;Therapeutic activities;Neuromuscular re-education;Taping;Dry needling;Passive range of motion;Manual techniques    PT Next Visit Plan  Lt shoulder ROM flexion/abduction/internal rotation; scap strength and gentle rotator cuff strengthening (isometrics if pt in too much)     PT Home Exercise Plan  next session    Consulted and Agree with Plan of Care  Patient       Patient will benefit from skilled therapeutic intervention in order to improve the following deficits and impairments:  Decreased activity tolerance, Decreased strength, Impaired flexibility, Impaired UE functional use, Postural dysfunction, Pain, Improper body mechanics, Decreased range of motion, Hypomobility  Visit Diagnosis: Chronic left shoulder pain - Plan: PT plan of care cert/re-cert  Stiffness of left shoulder, not elsewhere classified - Plan: PT plan of care cert/re-cert     Problem List Patient Active Problem List   Diagnosis Date Noted  . Testosterone deficiency 03/16/2018  . Chronic left shoulder pain 03/01/2018  . Body mass index 40.0-44.9, adult ([place]) 03/01/2018  . DDD (degenerative disc disease), lumbar 12/15/2017  . Chronic anticoagulation 10/26/2017  . Dyspnea 10/26/2017  . S/P right rotator cuff repair 04/05/2017  . Achilles tendon contracture, right 01/05/2017  . Closed nondisplaced fracture of distal phalanx of right great toe 01/05/2017  . Paroxysmal atrial fibrillation ([place]) 10/30/2016  . Pain in right ankle and joints of right foot 07/09/2016  . Diabetic polyneuropathy associated with type 2 diabetes mellitus ([place]) 07/09/2016  . 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 Impingement syndrome of right shoulder 07/07/2016  . Morbid obesity ([place]) 06/05/2016  . S/P TKR (total knee replacement), right 03/28/2015  . Knee osteoarthritis 11/12/2014  . Hypertension 10/15/2014  . Charcot foot due to diabetes mellitus ([place]) 10/01/2014  . Accelerated hypertension 10/01/2014  . Gangrene left third toe 10/01/2014    5:24 PM,04/14/18 Sherol Dade PT, DPT [facility] [address] [place], [place], [address] Phone: [phone]   Fax:  [phone]  Name: Jeffery [name] MRN: [id number] Date of Birth: Dec 31, 1960

## 2018-04-16 ENCOUNTER — Other Ambulatory Visit: Payer: Medicaid Other

## 2018-04-21 ENCOUNTER — Ambulatory Visit: Payer: Medicaid Other | Admitting: Physical Therapy

## 2018-04-21 ENCOUNTER — Encounter: Payer: Self-pay | Admitting: Physical Therapy

## 2018-04-21 DIAGNOSIS — M6281 Muscle weakness (generalized): Secondary | ICD-10-CM | POA: Diagnosis not present

## 2018-04-21 DIAGNOSIS — M25612 Stiffness of left shoulder, not elsewhere classified: Secondary | ICD-10-CM

## 2018-04-21 DIAGNOSIS — M25512 Pain in left shoulder: Secondary | ICD-10-CM | POA: Diagnosis not present

## 2018-04-21 DIAGNOSIS — G8929 Other chronic pain: Secondary | ICD-10-CM | POA: Diagnosis not present

## 2018-04-21 NOTE — Therapy (Signed)
[facility] [address] [place], [place], [address] Phone: [phone]   Fax:  [phone]  Physical Therapy Treatment  Patient Details  Name: Jeffery [name] MRN: [id number] Date of Birth: [birth year] Referring Provider (PT): Frankey Shown, MD    Encounter Date: 04/21/2018  PT End of Session - 04/21/18 1529    Visit Number  2    Number of Visits  4    Date for PT Re-Evaluation  06/13/18    Authorization Type  Medicaid  Eval plus 3 visits from 1/20-05/08/18    Authorization Time Period  04/14/18 to 06/13/18    PT Start Time  1445    PT Stop Time  1529    PT Time Calculation (min)  44 min    Activity Tolerance  Patient tolerated treatment well       Past Medical History:  Diagnosis Date  . Atrial fibrillation ([place])   . Charcot's joint of right foot   . DDD (degenerative disc disease), lumbar   . Diabetes mellitus without complication ([place])   . Fatty liver   . Hypertension   . Obesity   . Rotator cuff disorder   . Shoulder impingement, right     Past Surgical History:  Procedure Laterality Date  . AMPUTATION Left 10/02/2014   Procedure: Left Third toe amputation ;  Surgeon: [name], MD;  Location: [facility];  Service: Orthopedics;  Laterality: Left;  Regular bed, wants to follow hip  . ANTERIOR CRUCIATE LIGAMENT REPAIR Right 90   reconstruction  . APPLICATION OF WOUND VAC Left 10/02/2014   Procedure: APPLICATION OF WOUND VAC; toe Surgeon: [name], MD;  Location: [facility];  Service: Orthopedics;  Laterality: Left;  . I&D EXTREMITY Left 10/05/2014   Procedure: IRRIGATION AND DEBRIDEMENT LEFT FOOT;  Surgeon: [name], MD;  Location: [facility];  Service: Orthopedics;  Laterality: Left;  . KNEE ARTHROSCOPY W/ ACL RECONSTRUCTION Right   . TOTAL KNEE ARTHROPLASTY Right 03/28/2015  . TOTAL KNEE ARTHROPLASTY Right 03/28/2015   Procedure: RIGHT TOTAL KNEE ARTHROPLASTY;  Surgeon: [name], MD;  Location: [facility];   Service: Orthopedics;  Laterality: Right;    There were no vitals filed for this visit.  Subjective Assessment - 04/21/18 1444    Subjective  I could barely sleep last night.  My insurance is making me come here before I can get an MRI.  I'm eaten up with arthritis.  I'm getting tested for neuropathy or a pinched nerve.  I take 40 mg of Percoset a day and that's not enough.  I'm not going to be able to pull on that band!    Pertinent History  Rt and Lt TKA, Charcot's jt on Rt, DDD lumbar spine, HTN     Currently in Pain?  Yes    Pain Score  7     Pain Location  Shoulder    Pain Orientation  Upper;Lateral    Aggravating Factors   I can't push up with my left arm.                         [facility] Adult PT Treatment/Exercise - 04/21/18 0001      Shoulder Exercises: Supine   Protraction  AAROM;Left;5 reps      Shoulder Exercises: Seated   Other Seated Exercises  5 reps scapular retractions (painful)    Other Seated Exercises  bicep curls 10x  Shoulder Exercises: Isometric Strengthening   Flexion  5X5"    Extension  5X5"    ABduction  5X5"      Moist Heat Therapy   Number Minutes Moist Heat  10 Minutes   concurrent with manual therapy    Moist Heat Location  Shoulder      Manual Therapy   Joint Mobilization  glenohumeral distraction, posterior and inferior grade 1/2 3x 20 sec;  mob with movements with flexion and abduction     Soft tissue mobilization  periscapular, deltoids in propped supine position     Scapular Mobilization  medial/lateral and inferior/superior mobs grade 1/2 in right sidelying              PT Education - 04/21/18 1517    Education Details   Access Code: [email or web address] shoulder isometrics, scap retractions, bicep curls    Person(s) Educated  Patient    Methods  Explanation;Demonstration    Comprehension  Returned demonstration;Verbalized understanding       PT Short Term Goals - 04/14/18 1700      PT SHORT TERM GOAL #1   Title  Pt  will be independent with his initial HEP to decrease pain and improve ROM of the shoulder.    Time  4    Period  Weeks    Status  New    Target Date  05/15/18      PT SHORT TERM GOAL #2   Title  Pt will have increased Lt shoulder passive internal rotation to atleast 60 deg at 90 deg abduction which will assist with shoulder use throughout the day.     Time  4    Period  Weeks        PT Long Term Goals - 04/14/18 1701      PT LONG TERM GOAL #1   Title  Pt will report atleast 60% improvement in his Lt shoulder pain from the start of PT to increase his activity participation throughout the day.     Time  8    Period  Weeks    Status  New      PT LONG TERM GOAL #2   Title  Pt will have atleast 4/5 strength (pain free) of the Lt UE which will improve his efficiency with daily tasks.    Time  8    Period  Weeks    Status  New      PT LONG TERM GOAL #3   Title  Pt will report being able to sleep through the night atleast 4 days out of the week without increase in Lt shoulder pain.     Time  8    Period  Weeks    Status  New      PT LONG TERM GOAL #4   Title  Pt will have increased Lt shoulder active flexion ROM to atleast 160 deg without increase in pain which will allow him to reach overhead into a cabinet.     Time  8    Period  Weeks    Status  New            Plan - 04/21/18 1517    Clinical Impression Statement  The patient is fearful of of physical activity given his multiple muculoskeletal issues and chronic pain.  He reports temporary relief with gentle glenohumeral and scapular mobs as well as soft tissue work in these areas.  He is able to tolerate submaximal isometrics without exacerbation of pain.  Therapist closely monitoring response with all interventions.      Rehab Potential  Good    PT Frequency  2x / week    PT Treatment/Interventions  ADLs/Self Care Home Management;Cryotherapy;Iontophoresis 4mg /ml Dexamethasone;Moist Heat;Therapeutic exercise;Therapeutic  activities;Neuromuscular re-education;Taping;Dry needling;Passive range of motion;Manual techniques    PT Next Visit Plan  review shoulder isometrics (self resisted);   biceps curls;  gentle scapular strengthening     PT Home Exercise Plan   Access Code: [email or web address]     Consulted and Agree with Plan of Care  Patient       Patient will benefit from skilled therapeutic intervention in order to improve the following deficits and impairments:  Decreased activity tolerance, Decreased strength, Impaired flexibility, Impaired UE functional use, Postural dysfunction, Pain, Improper body mechanics, Decreased range of motion, Hypomobility  Visit Diagnosis: Chronic left shoulder pain  Stiffness of left shoulder, not elsewhere classified  Muscle weakness (generalized)     Problem List Patient Active Problem List   Diagnosis Date Noted  . Testosterone deficiency 03/16/2018  . Chronic left shoulder pain 03/01/2018  . Body mass index 40.0-44.9, adult ([place]) 03/01/2018  . DDD (degenerative disc disease), lumbar 12/15/2017  . Chronic anticoagulation 10/26/2017  . Dyspnea 10/26/2017  . S/P right rotator cuff repair 04/05/2017  . Achilles tendon contracture, right 01/05/2017  . Closed nondisplaced fracture of distal phalanx of right great toe 01/05/2017  . Paroxysmal atrial fibrillation ([place]) 10/30/2016  . Pain in right ankle and joints of right foot 07/09/2016  . Diabetic polyneuropathy associated with type 2 diabetes mellitus ([place]) 07/09/2016  . 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 Impingement syndrome of right shoulder 07/07/2016  . Morbid obesity ([place]) 06/05/2016  . S/P TKR (total knee replacement), right 03/28/2015  . Knee osteoarthritis 11/12/2014  . Hypertension 10/15/2014  . Charcot foot due to diabetes mellitus ([place]) 10/01/2014  . Accelerated hypertension 10/01/2014  . Gangrene left third toe 10/01/2014   Ruben Im,  PT 04/21/18 5:31 PM Phone: [phone] Fax: [phone]  Alvera Singh 04/21/2018, 5:31 PM  [facility] [address], [place], [place], [address] Phone: [phone]   Fax:  [phone]  Name: Jeffery [name] MRN: [id number] Date of Birth: [birth year]

## 2018-04-21 NOTE — Patient Instructions (Signed)
Access Code: [email or web address]  URL: [email or web address]  Date: 04/21/2018  Prepared by: Ruben Im   Exercises  Seated Scapular Retraction - 10 reps - 1 sets - 1x daily - 7x weekly  Seated Isometric Shoulder Abduction - 5 reps - 1 sets - 5 hold - 1x daily - 7x weekly  Seated Isometric Shoulder Extension - 5 reps - 1 sets - 5 hold - 1x daily - 7x weekly  Seated Isometric Shoulder Flexion - 5 reps - 1 sets - 5 hold - 1x daily - 7x weekly  Seated Single Arm Bicep Curls with Rotation and Dumbbell - 10 reps - 1 sets - 1x daily - 7x weekly

## 2018-04-25 ENCOUNTER — Other Ambulatory Visit: Payer: Self-pay | Admitting: Family Medicine

## 2018-04-25 DIAGNOSIS — E118 Type 2 diabetes mellitus with unspecified complications: Principal | ICD-10-CM

## 2018-04-25 DIAGNOSIS — E1165 Type 2 diabetes mellitus with hyperglycemia: Secondary | ICD-10-CM

## 2018-04-25 DIAGNOSIS — IMO0002 Reserved for concepts with insufficient information to code with codable children: Secondary | ICD-10-CM

## 2018-04-25 DIAGNOSIS — Z794 Long term (current) use of insulin: Principal | ICD-10-CM

## 2018-04-29 DIAGNOSIS — M25552 Pain in left hip: Secondary | ICD-10-CM | POA: Diagnosis not present

## 2018-04-29 DIAGNOSIS — M25512 Pain in left shoulder: Secondary | ICD-10-CM | POA: Diagnosis not present

## 2018-04-29 DIAGNOSIS — M25511 Pain in right shoulder: Secondary | ICD-10-CM | POA: Diagnosis not present

## 2018-04-29 DIAGNOSIS — G894 Chronic pain syndrome: Secondary | ICD-10-CM | POA: Diagnosis not present

## 2018-05-02 ENCOUNTER — Ambulatory Visit: Payer: Medicaid Other

## 2018-05-02 ENCOUNTER — Ambulatory Visit: Payer: Medicaid Other | Attending: Family Medicine

## 2018-05-02 DIAGNOSIS — E349 Endocrine disorder, unspecified: Secondary | ICD-10-CM

## 2018-05-02 MED ORDER — TESTOSTERONE CYPIONATE 200 MG/ML IM SOLN
200.0000 mg | Freq: Once | INTRAMUSCULAR | Status: AC
  Administered 2018-05-02: 200 mg via INTRAMUSCULAR

## 2018-05-02 NOTE — Progress Notes (Signed)
Patient arrived in clinic today to receive testosterone injection.  Patient was given vaccine in right GM.  Patient is to return in 28 days for next injection.

## 2018-05-05 ENCOUNTER — Encounter: Payer: Self-pay | Admitting: Physical Therapy

## 2018-05-05 ENCOUNTER — Ambulatory Visit: Payer: Medicaid Other | Attending: Orthopaedic Surgery | Admitting: Physical Therapy

## 2018-05-05 DIAGNOSIS — M6281 Muscle weakness (generalized): Secondary | ICD-10-CM | POA: Insufficient documentation

## 2018-05-05 DIAGNOSIS — M25612 Stiffness of left shoulder, not elsewhere classified: Secondary | ICD-10-CM | POA: Diagnosis not present

## 2018-05-05 DIAGNOSIS — G8929 Other chronic pain: Secondary | ICD-10-CM | POA: Insufficient documentation

## 2018-05-05 DIAGNOSIS — M25512 Pain in left shoulder: Secondary | ICD-10-CM | POA: Insufficient documentation

## 2018-05-05 NOTE — Therapy (Signed)
[facility] [address], [place], [place], [address] Phone: [phone]   Fax:  [phone]  Physical Therapy Treatment/Discharge  Patient Details  Name: Jeffery [name] MRN: [id number] Date of Birth: 12-16-1960 Referring Provider (PT): Frankey Shown, MD    Encounter Date: 05/05/2018  PT End of Session - 05/05/18 1219    Visit Number  3    Number of Visits  4    Date for PT Re-Evaluation  06/13/18    Authorization Type  Medicaid  Eval plus 3 visits from 1/20-05/08/18    Authorization Time Period  04/14/18 to 06/13/18    PT Start Time  1146    PT Stop Time  1225    PT Time Calculation (min)  39 min    Activity Tolerance  Patient tolerated treatment well;No increased pain    Behavior During Therapy  WFL for tasks assessed/performed       Past Medical History:  Diagnosis Date  . Atrial fibrillation ([place])   . Charcot's joint of right foot   . DDD (degenerative disc disease), lumbar   . Diabetes mellitus without complication ([place])   . Fatty liver   . Hypertension   . Obesity   . Rotator cuff disorder   . Shoulder impingement, right     Past Surgical History:  Procedure Laterality Date  . AMPUTATION Left 10/02/2014   Procedure: Left Third toe amputation ;  Surgeon: [name], MD;  Location: [place];  Service: Orthopedics;  Laterality: Left;  Regular bed, wants to follow hip  . ANTERIOR CRUCIATE LIGAMENT REPAIR Right 90   reconstruction  . APPLICATION OF WOUND VAC Left 10/02/2014   Procedure: APPLICATION OF WOUND VAC; toe Surgeon: [name], MD;  Location: [place];  Service: Orthopedics;  Laterality: Left;  . I&D EXTREMITY Left 10/05/2014   Procedure: IRRIGATION AND DEBRIDEMENT LEFT FOOT;  Surgeon: [name], MD;  Location: [place];  Service: Orthopedics;  Laterality: Left;  . KNEE ARTHROSCOPY W/ ACL RECONSTRUCTION Right   . TOTAL KNEE ARTHROPLASTY Right 03/28/2015  . TOTAL KNEE ARTHROPLASTY Right 03/28/2015   Procedure: RIGHT TOTAL KNEE ARTHROPLASTY;  Surgeon: [name], MD;  Location: [place];  Service: Orthopedics;  Laterality: Right;    There were no vitals filed for this visit.  Subjective Assessment - 05/05/18 1157    Subjective  Pt states that he has not been doing alot of his HEP. He is still having some good and some bad days. He is hoping to have his MRI soon.     Pertinent History  Rt and Lt TKA, Charcot's jt on Rt, DDD lumbar spine, HTN     Currently in Pain?  Other (Comment)   I know it's there but not cringe worthy        [facility] PT Assessment - 05/05/18 0001      Assessment   Medical Diagnosis  Lt rotator cuff tendonitis    Referring Provider (PT)  Frankey Shown, MD     Onset Date/Surgical Date  --   8 months ago   Hand Dominance  Right    Next MD Visit  05/24/2018    Prior Therapy  none for Lt shoulder       Balance Screen   Has the patient fallen in the past 6 months  No    Has the patient had a decrease in activity level because of a fear of falling?   No  Is the patient reluctant to leave their home because of a fear of falling?   No      Cognition   Overall Cognitive Status  Within Functional Limits for tasks assessed      Sensation   Additional Comments  pt denies numbness/tingling      AROM   Left Shoulder Flexion  130 Degrees    Left Shoulder ABduction  130 Degrees   pt denies pain    Left Shoulder Internal Rotation  --   reach behind back: (+) pain, S1   Left Shoulder External Rotation  --   reach behind head: (+) pain, C5     PROM   Left Shoulder Internal Rotation  20 Degrees   90 deg abd, (+) pain    Left Shoulder External Rotation  80 Degrees   (-) pain, 90 deg abduction     Strength   Right Shoulder Flexion  4+/5    Right Shoulder ABduction  4/5    Left Shoulder Flexion  4/5    Left Shoulder ABduction  3/5   (+) pain   Left Shoulder Internal Rotation  4/5    Left Shoulder External Rotation  3/5   (+) pain     Palpation   Palpation  comment  Pt denies tenderness along Lt shoulder                    [facility] Adult PT Treatment/Exercise - 05/05/18 0001      Shoulder Exercises: Supine   Flexion  AROM;10 reps    ABduction  --      Shoulder Exercises: Sidelying   External Rotation  Left;AROM;10 reps    ABduction  AROM;Left;15 reps      Shoulder Exercises: Standing   ABduction  AAROM;Left;10 reps    ABduction Limitations  wall slide    Extension  Left;AAROM;15 reps    Extension Limitations  wall slide    Row  Both;Strengthening;15 reps    [place]aband Level (Shoulder Row)  Level 4 (Blue)      Shoulder Exercises: ROM/Strengthening   Wall Pushups  10 reps             PT Education - 05/05/18 1256    Education Details  importance of HEP adherence; d/c to allow for increase in HEP performance and return as needed following next MD appointment ; reviewed HEP    Person(s) Educated  Patient    Methods  Explanation;Handout;Verbal cues    Comprehension  Verbalized understanding;Returned demonstration       PT Short Term Goals - 05/05/18 1540      PT SHORT TERM GOAL #1   Title  Pt will be independent with his initial HEP to decrease pain and improve ROM of the shoulder.    Time  4    Period  Weeks    Status  Not Met      PT SHORT TERM GOAL #2   Title  Pt will have increased Lt shoulder passive internal rotation to atleast 60 deg at 90 deg abduction which will assist with shoulder use throughout the day.     Time  4    Period  Weeks    Status  Not Met        PT Long Term Goals - 05/05/18 1540      PT LONG TERM GOAL #1   Title  Pt will report atleast 60% improvement in his Lt shoulder pain from the start of PT to  increase his activity participation throughout the day.     Time  8    Period  Weeks    Status  Not Met      PT LONG TERM GOAL #2   Title  Pt will have atleast 4/5 strength (pain free) of the Lt UE which will improve his efficiency with daily tasks.    Time  8    Period  Weeks     Status  Partially Met      PT LONG TERM GOAL #3   Title  Pt will report being able to sleep through the night atleast 4 days out of the week without increase in Lt shoulder pain.     Time  8    Period  Weeks    Status  Not Met      PT LONG TERM GOAL #4   Title  Pt will have increased Lt shoulder active flexion ROM to atleast 160 deg without increase in pain which will allow him to reach overhead into a cabinet.     Time  8    Period  Weeks    Status  Not Met            Plan - 05/05/18 1535    Clinical Impression Statement  Pt was discharged this visit having made minimal progress towards his PT goals in the past 2 visits. Pt is non adherent with his HEP and requires redirection during his sessions, limiting attention to technique and exercise performance. Pt's active Lt shoulder ROM remains limited, although pain response is inconsistent from his evaluation. He did respond well to manual techniques in previous sessions, however his lack of HEP adherence throughout the week made this difficult to maintain good results. Pt has maintained 4/5 MMT strength of the Lt shoulder with primary weakness into shoulder abduction during today's assessment. Pt is focused on getting his MRI and would benefit from increasing his HEP adherence moving forward with potential return to PT in the future to continue working on strength and flexibility limitations if needed    Rehab Potential  Good    PT Frequency  2x / week    PT Treatment/Interventions  ADLs/Self Care Home Management;Cryotherapy;Iontophoresis 3m/ml Dexamethasone;Moist Heat;Therapeutic exercise;Therapeutic activities;Neuromuscular re-education;Taping;Dry needling;Passive range of motion;Manual techniques    PT Next Visit Plan  d/c with HEP    PT Home Exercise Plan   Access Code: [email or web address]    Consulted and Agree with Plan of Care  Patient       Patient will benefit from skilled therapeutic intervention in order to improve the following  deficits and impairments:  Decreased activity tolerance, Decreased strength, Impaired flexibility, Impaired UE functional use, Postural dysfunction, Pain, Improper body mechanics, Decreased range of motion, Hypomobility  Visit Diagnosis: Chronic left shoulder pain  Stiffness of left shoulder, not elsewhere classified  Muscle weakness (generalized)     Problem List Patient Active Problem List   Diagnosis Date Noted  . Testosterone deficiency 03/16/2018  . Chronic left shoulder pain 03/01/2018  . Body mass index 40.0-44.9, adult ([place] 03/01/2018  . DDD (degenerative disc disease), lumbar 12/15/2017  . Chronic anticoagulation 10/26/2017  . Dyspnea 10/26/2017  . S/P right rotator cuff repair 04/05/2017  . Achilles tendon contracture, right 01/05/2017  . Closed nondisplaced fracture of distal phalanx of right great toe 01/05/2017  . Paroxysmal atrial fibrillation ([place] 10/30/2016  . Pain in right ankle and joints of right foot 07/09/2016  . Diabetic polyneuropathy associated with type 2  diabetes mellitus ([place]) 07/09/2016  . 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 Impingement syndrome of right shoulder 07/07/2016  . Morbid obesity ([place]) 06/05/2016  . S/P TKR (total knee replacement), right 03/28/2015  . Knee osteoarthritis 11/12/2014  . Hypertension 10/15/2014  . Charcot foot due to diabetes mellitus ([place]) 10/01/2014  . Accelerated hypertension 10/01/2014  . Gangrene left third toe 10/01/2014    PHYSICAL THERAPY DISCHARGE SUMMARY  Visits from Start of Care: 3  Current functional level related to goals / functional outcomes: See above for more details    Remaining deficits: See above for more details    Education / Equipment: See above for more details   Plan: Patient agrees to discharge.  Patient goals were not met. Patient is being discharged due to lack of progress.  ?????         3:42 PM,05/05/18 Sherol Dade PT, [place] [facility] [place]  [facility] [address], [place], [place], [address] Phone: [phone]   Fax:  [phone]  Name: AIDIN DOANE MRN: [id number] Date of Birth: [birth year]

## 2018-05-05 NOTE — Patient Instructions (Signed)
Access Code: [email or web address]  URL: [email or web address]  Date: 05/05/2018  Prepared by: Sherol Dade   Exercises  Supine Shoulder Flexion PROM - 10 reps - 1 sets - 1x daily - 7x weekly  Sidelying Shoulder Abduction Palm Forward - 10 reps - 1 sets - 1x daily - 7x weekly  Sidelying Shoulder External Rotation - 10 reps - 2 sets - 1x daily - 7x weekly  Standing Single Shoulder Flexion Wall Slide with Palm Up - 15 reps - 2 sets - 1x daily - 7x weekly  Wall Push Up - 5-10 reps - 1 sets - 1x daily - 7x weekly    [facility] [address], [place], [place] [address] Phone # [phone] Fax [phone]

## 2018-05-13 DIAGNOSIS — E1143 Type 2 diabetes mellitus with diabetic autonomic (poly)neuropathy: Secondary | ICD-10-CM | POA: Diagnosis not present

## 2018-05-13 DIAGNOSIS — G603 Idiopathic progressive neuropathy: Secondary | ICD-10-CM | POA: Diagnosis not present

## 2018-05-24 ENCOUNTER — Encounter (INDEPENDENT_AMBULATORY_CARE_PROVIDER_SITE_OTHER): Payer: Self-pay | Admitting: Orthopaedic Surgery

## 2018-05-24 ENCOUNTER — Ambulatory Visit (INDEPENDENT_AMBULATORY_CARE_PROVIDER_SITE_OTHER): Payer: Medicaid Other | Admitting: Orthopaedic Surgery

## 2018-05-24 VITALS — Ht 75.0 in | Wt 332.0 lb

## 2018-05-24 DIAGNOSIS — G8929 Other chronic pain: Secondary | ICD-10-CM | POA: Diagnosis not present

## 2018-05-24 DIAGNOSIS — M25512 Pain in left shoulder: Secondary | ICD-10-CM

## 2018-05-24 NOTE — Progress Notes (Signed)
Office Visit Note   Patient: Jeffery [name]           Date of Birth: August 30, 1960           MRN: [id number] Visit Date: 05/24/2018              Requested by: [name], MD [place], [place] [id number] PCP: [name], MD   Assessment & Plan: Visit Diagnoses:  1. Chronic left shoulder pain     Plan: At this point patient has failed conservative treatment.  We will obtain an MRI of the left shoulder to rule out structural abnormalities.  Follow-up to review the MRI.  Follow-Up Instructions: Return in about 10 days (around 06/03/2018).   Orders:  Orders Placed This Encounter  Procedures  . MR Shoulder Left w/o contrast   No orders of the defined types were placed in this encounter.     Procedures: No procedures performed   Clinical Data: No additional findings.   Subjective: Chief Complaint  Patient presents with  . Left Shoulder - Follow-up    Jeffery [name] follows up today for continued left shoulder pain.  He has made minimal progress with physical therapy.  He continues to have constant aching pain in his left shoulder deltoid region.   Review of Systems  Constitutional: Negative.   All other systems reviewed and are negative.    Objective: Vital Signs: Ht 6\' 3"  (1.905 m)   Wt (!) 332 lb (150.6 kg)   BMI 41.50 kg/m   Physical Exam Vitals signs and nursing note reviewed.  Constitutional:      Appearance: He is well-developed.  Pulmonary:     Effort: Pulmonary effort is normal.  Abdominal:     Palpations: Abdomen is soft.  Skin:    General: Skin is warm.  Neurological:     Mental Status: He is alert and oriented to person, place, and time.  Psychiatric:        Behavior: Behavior normal.        Thought Content: Thought content normal.        Judgment: Judgment normal.     Ortho Exam Left shoulder exam is unchanged. Specialty Comments:  No specialty comments available.  Imaging: No results found.   PMFS  History: Patient Active Problem List   Diagnosis Date Noted  . Testosterone deficiency 03/16/2018  . Chronic left shoulder pain 03/01/2018  . Body mass index 40.0-44.9, adult ([place]) 03/01/2018  . DDD (degenerative disc disease), lumbar 12/15/2017  . Chronic anticoagulation 10/26/2017  . Dyspnea 10/26/2017  . S/P right rotator cuff repair 04/05/2017  . Achilles tendon contracture, right 01/05/2017  . Closed nondisplaced fracture of distal phalanx of right great toe 01/05/2017  . Paroxysmal atrial fibrillation ([place]) 10/30/2016  . Pain in right ankle and joints of right foot 07/09/2016  . Diabetic polyneuropathy associated with type 2 diabetes mellitus ([employer]) 07/09/2016  . 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 Impingement syndrome of right shoulder 07/07/2016  . Morbid obesity ([place]) 06/05/2016  . S/P TKR (total knee replacement), right 03/28/2015  . Knee osteoarthritis 11/12/2014  . Hypertension 10/15/2014  . Charcot foot due to diabetes mellitus ([place]) 10/01/2014  . Accelerated hypertension 10/01/2014  . Gangrene left third toe 10/01/2014   Past Medical History:  Diagnosis Date  . Atrial fibrillation ([place])   . Charcot's joint of right foot   . DDD (degenerative disc disease), lumbar   . Diabetes mellitus  without complication ([place])   . Fatty liver   . Hypertension   . Obesity   . Rotator cuff disorder   . Shoulder impingement, right     Family History  Problem Relation Age of Onset  . Diabetes Father   . Hypertension Father   . Heart failure Father     Past Surgical History:  Procedure Laterality Date  . AMPUTATION Left 10/02/2014   Procedure: Left Third toe amputation ;  Surgeon: [name], MD;  Location: [place];  Service: Orthopedics;  Laterality: Left;  Regular bed, wants to follow hip  . ANTERIOR CRUCIATE LIGAMENT REPAIR Right 90   reconstruction  . APPLICATION OF WOUND VAC Left 10/02/2014   Procedure: APPLICATION  OF WOUND VAC; toe Surgeon: [name], MD;  Location: [place];  Service: Orthopedics;  Laterality: Left;  . I&D EXTREMITY Left 10/05/2014   Procedure: IRRIGATION AND DEBRIDEMENT LEFT FOOT;  Surgeon: [name], MD;  Location: [place];  Service: Orthopedics;  Laterality: Left;  . KNEE ARTHROSCOPY W/ ACL RECONSTRUCTION Right   . TOTAL KNEE ARTHROPLASTY Right 03/28/2015  . TOTAL KNEE ARTHROPLASTY Right 03/28/2015   Procedure: RIGHT TOTAL KNEE ARTHROPLASTY;  Surgeon: [name], MD;  Location: [place];  Service: Orthopedics;  Laterality: Right;   Social History   Occupational History  . Occupation: Management consultant  Tobacco Use  . Smoking status: Former Smoker    Packs/day: 0.00    Years: 38.00    Pack years: 0.00    Types: Cigarettes  . Smokeless tobacco: Never Used  Substance and Sexual Activity  . Alcohol use: No    Alcohol/week: 5.0 - 6.0 standard drinks    Types: 5 - 6 Cans of beer per week  . Drug use: Yes    Types: Marijuana    Comment: last week ; denies 03/24/17  . Sexual activity: Not on file

## 2018-05-27 DIAGNOSIS — G894 Chronic pain syndrome: Secondary | ICD-10-CM | POA: Diagnosis not present

## 2018-05-27 DIAGNOSIS — M25511 Pain in right shoulder: Secondary | ICD-10-CM | POA: Diagnosis not present

## 2018-05-27 DIAGNOSIS — M545 Low back pain: Secondary | ICD-10-CM | POA: Diagnosis not present

## 2018-05-27 DIAGNOSIS — M25512 Pain in left shoulder: Secondary | ICD-10-CM | POA: Diagnosis not present

## 2018-05-29 ENCOUNTER — Ambulatory Visit
Admission: RE | Admit: 2018-05-29 | Discharge: 2018-05-29 | Disposition: A | Discharge status: Home / Self Care | Payer: Medicaid Other | Source: Ambulatory Visit | Attending: Orthopaedic Surgery | Admitting: Orthopaedic Surgery

## 2018-05-29 DIAGNOSIS — S46012A Strain of muscle(s) and tendon(s) of the rotator cuff of left shoulder, initial encounter: Secondary | ICD-10-CM | POA: Diagnosis not present

## 2018-05-29 DIAGNOSIS — M25512 Pain in left shoulder: Principal | ICD-10-CM

## 2018-05-29 DIAGNOSIS — G8929 Other chronic pain: Secondary | ICD-10-CM

## 2018-05-30 ENCOUNTER — Ambulatory Visit: Payer: Medicaid Other | Attending: Family Medicine | Admitting: Emergency Medicine

## 2018-05-30 DIAGNOSIS — E291 Testicular hypofunction: Secondary | ICD-10-CM

## 2018-05-30 DIAGNOSIS — E349 Endocrine disorder, unspecified: Secondary | ICD-10-CM

## 2018-05-30 MED ORDER — TESTOSTERONE CYPIONATE 200 MG/ML IM SOLN
200.0000 mg | Freq: Once | INTRAMUSCULAR | Status: AC
  Administered 2018-05-30 – 2018-06-27 (×2): 200 mg via INTRAMUSCULAR

## 2018-05-30 NOTE — Progress Notes (Signed)
Needs fu appt please

## 2018-06-07 ENCOUNTER — Ambulatory Visit (INDEPENDENT_AMBULATORY_CARE_PROVIDER_SITE_OTHER): Payer: Medicaid Other | Admitting: Orthopaedic Surgery

## 2018-06-07 DIAGNOSIS — M25512 Pain in left shoulder: Secondary | ICD-10-CM

## 2018-06-07 DIAGNOSIS — G8929 Other chronic pain: Secondary | ICD-10-CM

## 2018-06-07 MED ORDER — KETOROLAC TROMETHAMINE 15 MG/ML IJ SOLN: 15.0000 mg | Freq: Once | INTRAMUSCULAR | Status: AC

## 2018-06-07 MED ORDER — AMOXICILLIN 500 MG PO CAPS: ORAL_CAPSULE | ORAL | 0 refills | Status: DC

## 2018-06-07 MED ORDER — LIDOCAINE HCL 2 % IJ SOLN
2.0000 mL | INTRAMUSCULAR | Status: AC | PRN
  Administered 2018-06-07: 2 mL

## 2018-06-07 MED ORDER — BUPIVACAINE HCL 0.25 % IJ SOLN
2.0000 mL | INTRAMUSCULAR | Status: AC | PRN
  Administered 2018-06-07: 2 mL via INTRA_ARTICULAR

## 2018-06-07 NOTE — Progress Notes (Signed)
Office Visit Note   Patient: Jeffery [name]           Date of Birth: Aug 29, 1960           MRN: [id number] Visit Date: 06/07/2018              Requested by: [name], MD [place], Jeffery [name] [id number] PCP: [name], MD   Assessment & Plan: Visit Diagnoses:  1. Chronic left shoulder pain     Plan: Impression is left shoulder pain with partial rotator cuff tear and AC arthropathy.  Patient is failed conservative treatment to include over-the-counter medications, subacromial cortisone injections and formal physical therapy.  We would like to proceed with definitive treatment of a left shoulder arthroscopic acromioplasty and distal clavicle excision as well as extensive debridement and possible rotator cuff repair.  Risks, benefits and possible occasions reviewed.  Rehab recovery time discussed.  All questions were answered.  We will need to get cardiac clearance as he is on Eliquis for his A. fib.  He is also a type II diabetic.  His last hemoglobin A1c was over 9.  He is scheduled to see his PCP on March 18 where they will redraw this.  I have discussed with him that his hemoglobin A1c will need to be under 8.0 for [place] to proceed with surgical intervention.  He will call us with those results.  If his A1c is under 8.0, we will have Jeffery [name] give him a call to schedule his surgery.  In the meantime, we will proceed with a subacromial Toradol injection today.  Follow-Up Instructions: Return if symptoms worsen or fail to improve.   Orders:  Orders Placed This Encounter  Procedures  . Large Joint Inj: L subacromial bursa   Meds ordered this encounter  Medications  . ketorolac (TORADOL) 15 MG/ML injection 15 mg      Procedures: Large Joint Inj: L subacromial bursa on 06/07/2018 3:20 PM Indications: pain Details: 22 G needle Medications: 2 mL bupivacaine 0.25 %; 2 mL lidocaine 2 % Outcome: tolerated well, no immediate complications Patient was prepped and  draped in the usual sterile fashion.       Clinical Data: No additional findings.   Subjective: Chief Complaint  Patient presents with  . Left Shoulder - Follow-up    HPI patient is a pleasant 58 year old gentleman who presents to clinic today discuss MRI results of the left shoulder.  History of left shoulder pain with weakness for the past year.  He had failed conservative treatment to include over-the-counter medications, cortisone injections as well as formal physical therapy.  He has had continued pain.  He is also on chronic Percocet tens for his back.  MRI from 05/29/2018 shows a high-grade partial-thickness tear bursal surface supraspinatus, small intrasubstance tear infraspinatus, partial thickness tear articular surface subscapularis and moderate AC osteoarthritis.  Review of Systems as detailed in HPI.  All others reviewed and are negative.   Objective: Vital Signs: There were no vitals taken for this visit.  Physical Exam well-developed and well-nourished gentleman in no acute distress.  Alert and oriented x3.  Ortho Exam stable exam of the left shoulder  Specialty Comments:  No specialty comments available.  Imaging: No new imaging   PMFS History: Patient Active Problem List   Diagnosis Date Noted  . Testosterone deficiency 03/16/2018  . Chronic left shoulder pain 03/01/2018  . Body mass index 40.0-44.9, adult ([place]) 03/01/2018  . DDD (degenerative disc disease), lumbar 12/15/2017  .  Chronic anticoagulation 10/26/2017  . Dyspnea 10/26/2017  . S/P right rotator cuff repair 04/05/2017  . Achilles tendon contracture, right 01/05/2017  . Closed nondisplaced fracture of distal phalanx of right great toe 01/05/2017  . Paroxysmal atrial fibrillation ([place]) 10/30/2016  . Pain in right ankle and joints of right foot 07/09/2016  . Diabetic polyneuropathy associated with type 2 diabetes mellitus ([place]) 07/09/2016  . 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 Impingement syndrome of right shoulder 07/07/2016  . Morbid obesity ([place]) 06/05/2016  . S/P TKR (total knee replacement), right 03/28/2015  . Knee osteoarthritis 11/12/2014  . Hypertension 10/15/2014  . Charcot foot due to diabetes mellitus ([place]) 10/01/2014  . Accelerated hypertension 10/01/2014  . Gangrene left third toe 10/01/2014   Past Medical History:  Diagnosis Date  . Atrial fibrillation ([place])   . Charcot's joint of right foot   . DDD (degenerative disc disease), lumbar   . Diabetes mellitus without complication ([place])   . Fatty liver   . Hypertension   . Obesity   . Rotator cuff disorder   . Shoulder impingement, right     Family History  Problem Relation Age of Onset  . Diabetes Father   . Hypertension Father   . Heart failure Father     Past Surgical History:  Procedure Laterality Date  . AMPUTATION Left 10/02/2014   Procedure: Left Third toe amputation ;  Surgeon: [name], MD;  Location: [place];  Service: Orthopedics;  Laterality: Left;  Regular bed, wants to follow hip  . ANTERIOR CRUCIATE LIGAMENT REPAIR Right 90   reconstruction  . APPLICATION OF WOUND VAC Left 10/02/2014   Procedure: APPLICATION OF WOUND VAC; toe Surgeon: [name], MD;  Location: [place];  Service: Orthopedics;  Laterality: Left;  . I&D EXTREMITY Left 10/05/2014   Procedure: IRRIGATION AND DEBRIDEMENT LEFT FOOT;  Surgeon: [name], MD;  Location: [place];  Service: Orthopedics;  Laterality: Left;  . KNEE ARTHROSCOPY W/ ACL RECONSTRUCTION Right   . TOTAL KNEE ARTHROPLASTY Right 03/28/2015  . TOTAL KNEE ARTHROPLASTY Right 03/28/2015   Procedure: RIGHT TOTAL KNEE ARTHROPLASTY;  Surgeon: [name], MD;  Location: [place];  Service: Orthopedics;  Laterality: Right;   Social History   Occupational History  . Occupation: Management consultant  Tobacco Use  . Smoking status: Former Smoker    Packs/day: 0.00    Years: 38.00    Pack years:  0.00    Types: Cigarettes  . Smokeless tobacco: Never Used  Substance and Sexual Activity  . Alcohol use: No    Alcohol/week: 5.0 - 6.0 standard drinks    Types: 5 - 6 Cans of beer per week  . Drug use: Yes    Types: Marijuana    Comment: last week ; denies 03/24/17  . Sexual activity: Not on file

## 2018-06-14 ENCOUNTER — Other Ambulatory Visit: Payer: Self-pay | Admitting: Family Medicine

## 2018-06-15 ENCOUNTER — Ambulatory Visit: Payer: Medicaid Other | Attending: Family Medicine | Admitting: Family Medicine

## 2018-06-15 ENCOUNTER — Other Ambulatory Visit: Payer: Self-pay

## 2018-06-15 ENCOUNTER — Encounter: Payer: Self-pay | Admitting: Family Medicine

## 2018-06-15 VITALS — BP 158/99 | HR 69 | Temp 98.0°F | Ht 75.0 in | Wt 340.0 lb

## 2018-06-15 DIAGNOSIS — Z6841 Body Mass Index (BMI) 40.0 and over, adult: Secondary | ICD-10-CM | POA: Diagnosis not present

## 2018-06-15 DIAGNOSIS — E291 Testicular hypofunction: Secondary | ICD-10-CM | POA: Diagnosis not present

## 2018-06-15 DIAGNOSIS — E349 Endocrine disorder, unspecified: Secondary | ICD-10-CM

## 2018-06-15 DIAGNOSIS — Z8249 Family history of ischemic heart disease and other diseases of the circulatory system: Secondary | ICD-10-CM | POA: Diagnosis not present

## 2018-06-15 DIAGNOSIS — I480 Paroxysmal atrial fibrillation: Secondary | ICD-10-CM | POA: Insufficient documentation

## 2018-06-15 DIAGNOSIS — Z79899 Other long term (current) drug therapy: Secondary | ICD-10-CM | POA: Insufficient documentation

## 2018-06-15 DIAGNOSIS — Z96651 Presence of right artificial knee joint: Secondary | ICD-10-CM | POA: Diagnosis not present

## 2018-06-15 DIAGNOSIS — Z89422 Acquired absence of other left toe(s): Secondary | ICD-10-CM | POA: Diagnosis not present

## 2018-06-15 DIAGNOSIS — E11610 Type 2 diabetes mellitus with diabetic neuropathic arthropathy: Secondary | ICD-10-CM | POA: Insufficient documentation

## 2018-06-15 DIAGNOSIS — G8929 Other chronic pain: Secondary | ICD-10-CM | POA: Insufficient documentation

## 2018-06-15 DIAGNOSIS — Z7901 Long term (current) use of anticoagulants: Secondary | ICD-10-CM | POA: Diagnosis not present

## 2018-06-15 DIAGNOSIS — E1165 Type 2 diabetes mellitus with hyperglycemia: Secondary | ICD-10-CM

## 2018-06-15 DIAGNOSIS — I10 Essential (primary) hypertension: Secondary | ICD-10-CM | POA: Insufficient documentation

## 2018-06-15 DIAGNOSIS — M25512 Pain in left shoulder: Secondary | ICD-10-CM | POA: Insufficient documentation

## 2018-06-15 DIAGNOSIS — Z794 Long term (current) use of insulin: Secondary | ICD-10-CM | POA: Insufficient documentation

## 2018-06-15 DIAGNOSIS — Z833 Family history of diabetes mellitus: Secondary | ICD-10-CM | POA: Diagnosis not present

## 2018-06-15 DIAGNOSIS — E1142 Type 2 diabetes mellitus with diabetic polyneuropathy: Secondary | ICD-10-CM | POA: Diagnosis not present

## 2018-06-15 DIAGNOSIS — E118 Type 2 diabetes mellitus with unspecified complications: Secondary | ICD-10-CM

## 2018-06-15 DIAGNOSIS — IMO0002 Reserved for concepts with insufficient information to code with codable children: Secondary | ICD-10-CM

## 2018-06-15 LAB — GLUCOSE, POCT (MANUAL RESULT ENTRY): POC Glucose: 210 mg/dl — AB (ref 70–99)

## 2018-06-15 MED ORDER — LOSARTAN POTASSIUM 100 MG PO TABS: 100.0000 mg | ORAL_TABLET | Freq: Every day | ORAL | 6 refills | Status: DC

## 2018-06-15 NOTE — Progress Notes (Signed)
Subjective:  Patient ID: Jeffery [name], male    DOB: Aug 22, 1960  Age: 58 y.o. MRN: [id number]  CC: Diabetes   HPI Jeffery [name]  is a 58 year old male with history of type 2 diabetes mellitus (A1c 9.2), hypertension, Charcot foot due to diabetes mellitus, Paroxysmal A. fib (currently on rate control with metoprolol and anticoagulation with Eliquis), status post right rotator cuff repair who presents today for follow-up visit. He has left shoulder pain and is scheduled for surgery but will need A1c to be less than 8.0 as per orthopedics.    MRI left shoulder from 05/30/2018 revealed: IMPRESSION: 1. Moderate supraspinatus tendinosis with high-grade partial-thickness bursal surface tear of the distal tendon and delaminating intrasubstance component extending proximally to the critical zone. 2. Mild infraspinatus tendinosis with small intrasubstance tear at the insertion. 3. Moderate subscapularis tendinosis with low-grade partial-thickness articular surface tear. 4. Findings which can be seen with adhesive capsulitis. 5. Moderate acromioclavicular osteoarthritis. 6. Mild subacromial/subdeltoid bursitis.  Endorses compliance with his insulin and denies hypoglycemia.  He is chronic pain is managed by the pain clinic where he receives oxycodone.  He is compliant with his antihypertensive but his blood pressure is elevated. Of note he gained 8 pounds since his last visit 3 months ago.  Past Medical History:  Diagnosis Date   Atrial fibrillation ([place])    Charcot's joint of right foot    DDD (degenerative disc disease), lumbar    Diabetes mellitus without complication ([place])    Fatty liver    Hypertension    Obesity    Rotator cuff disorder    Shoulder impingement, right     Past Surgical History:  Procedure Laterality Date   AMPUTATION Left 10/02/2014   Procedure: Left Third toe amputation ;  Surgeon: [name], MD;  Location: [place];  Service: Orthopedics;   Laterality: Left;  Regular bed, wants to follow hip   ANTERIOR CRUCIATE LIGAMENT REPAIR Right 90   reconstruction   APPLICATION OF WOUND VAC Left 10/02/2014   Procedure: APPLICATION OF WOUND VAC; toe Surgeon: [name], MD;  Location: [place];  Service: Orthopedics;  Laterality: Left;   I&D EXTREMITY Left 10/05/2014   Procedure: IRRIGATION AND DEBRIDEMENT LEFT FOOT;  Surgeon: [name], MD;  Location: [place];  Service: Orthopedics;  Laterality: Left;   KNEE ARTHROSCOPY W/ ACL RECONSTRUCTION Right    TOTAL KNEE ARTHROPLASTY Right 03/28/2015   TOTAL KNEE ARTHROPLASTY Right 03/28/2015   Procedure: RIGHT TOTAL KNEE ARTHROPLASTY;  Surgeon: [name], MD;  Location: [place];  Service: Orthopedics;  Laterality: Right;    Family History  Problem Relation Age of Onset   Diabetes Father    Hypertension Father    Heart failure Father     No Known Allergies  Outpatient Medications Prior to Visit  Medication Sig Dispense Refill   amoxicillin (AMOXIL) 500 MG capsule Take 4 caps one hour prior to dental work 12 capsule 0   apixaban (ELIQUIS) 5 MG TABS tablet Take 1 tablet (5 mg total) by mouth 2 (two) times daily. 60 tablet 6   atorvastatin (LIPITOR) 20 MG tablet Take 1 tablet (20 mg total) by mouth daily. 90 tablet 1   Blood Glucose Monitoring Suppl (TRUE METRIX METER) DEVI 1 each by Does not apply route 3 (three) times daily before meals. 1 Device 0   gabapentin (NEURONTIN) 300 MG capsule Take 1 capsule (300 mg total) by mouth at bedtime. 90 capsule 1   glucose  blood (TRUE METRIX BLOOD GLUCOSE TEST) test strip Use 3 times daily before meals 100 each 12   Insulin Glargine (LANTUS SOLOSTAR) 100 UNIT/ML Solostar Pen Inject 50 Units into the skin 2 (two) times daily. 30 mL 6   insulin lispro (HUMALOG) 100 UNIT/ML injection Inject 0.07 mLs (7 Units total) into the skin 3 (three) times daily before meals. 30 mL 6   Insulin Pen Needle (B-D ULTRAFINE III SHORT PEN) 31G X 8 MM MISC 1 each  by Does not apply route 3 (three) times daily. 100 each 5   Insulin Syringe-Needle U-100 (TRUEPLUS INSULIN SYRINGE) 30G X 5/16" 0.5 ML MISC Use as directed 3 times daily 100 each 5   lidocaine (LIDODERM) 5 % PLACE 1 PATCH DAILY ONTO THE SKIN. REMOVE AND DISCARD PATCH WITHIN 12 HOURS OR AS DIRECTED BY MD. 30 patch 0   metoprolol tartrate (LOPRESSOR) 100 MG tablet Take 1 tablet (100 mg total) by mouth 2 (two) times daily. 180 tablet 1   oxyCODONE-acetaminophen (PERCOCET) 5-325 MG tablet 1-2 tabs po bid prn pain 30 tablet 0   sodium chloride (OCEAN) 0.65 % SOLN nasal spray Place 1 spray into both nostrils 2 (two) times daily as needed for congestion.     testosterone cypionate (DEPOTESTOSTERONE CYPIONATE) 200 MG/ML injection Inject 1 mL (200 mg total) into the muscle every 28 (twenty-eight) days. 10 mL 3   tiZANidine (ZANAFLEX) 4 MG tablet TAKE ONE TABLET BY MOUTH EVERY NIGHT AT BEDTIME 90 tablet 1   TRUEPLUS INSULIN SYRINGE 30G X 5/16" 0.5 ML MISC USE AS DIRECTED 3 TIMES DAILY 100 each 0   TRUEPLUS LANCETS 28G MISC 1 each by Does not apply route 3 (three) times daily before meals. 100 each 12   losartan (COZAAR) 50 MG tablet Take 1 tablet (50 mg total) by mouth daily. 90 tablet 1   No facility-administered medications prior to visit.      ROS Review of Systems  Constitutional: Negative for activity change and appetite change.  HENT: Negative for sinus pressure and sore throat.   Eyes: Negative for visual disturbance.  Respiratory: Negative for cough, chest tightness and shortness of breath.   Cardiovascular: Negative for chest pain and leg swelling.  Gastrointestinal: Negative for abdominal distention, abdominal pain, constipation and diarrhea.  Endocrine: Negative.   Genitourinary: Negative for dysuria.  Musculoskeletal:       See hpi  Skin: Negative for rash.  Allergic/Immunologic: Negative.   Neurological: Negative for weakness, light-headedness and numbness.    Psychiatric/Behavioral: Negative for dysphoric mood and suicidal ideas.    Objective:  BP (!) 158/99    Pulse 69    Temp 98 F (36.7 C) (Oral)    Ht 6\' 3"  (1.905 m)    Wt (!) 340 lb (154.2 kg)    SpO2 97%    BMI 42.50 kg/m   BP/Weight 06/15/2018 05/24/2018 46/80/3212  Systolic BP 248 - 250  Diastolic BP 99 - 84  Wt. (Lbs) 340 332 332.2  BMI 42.5 41.5 41.52      Physical Exam Constitutional:      Appearance: He is well-developed.  Cardiovascular:     Rate and Rhythm: Normal rate.     Heart sounds: Normal heart sounds. No murmur.  Pulmonary:     Effort: Pulmonary effort is normal.     Breath sounds: Normal breath sounds. No wheezing or rales.  Chest:     Chest wall: No tenderness.  Abdominal:     General: Bowel sounds are  normal. There is no distension.     Palpations: Abdomen is soft. There is no mass.     Tenderness: There is no abdominal tenderness.  Musculoskeletal:     Comments: Limited abduction of left shoulder; patient associated with tenderness.  Neurological:     Mental Status: He is alert and oriented to person, place, and time.     CMP Latest Ref Rng & Units 10/24/2017 04/15/2017 03/26/2017  Glucose 70 - 99 mg/dL 277(H) CANCELED 107(H)  BUN 6 - 20 mg/dL 15 CANCELED 12  Creatinine 0.61 - 1.24 mg/dL 1.13 CANCELED 0.73  Sodium 135 - 145 mmol/L 134(L) CANCELED 137  Potassium 3.5 - 5.1 mmol/L 4.6 CANCELED 4.7  Chloride 98 - 111 mmol/L 101 CANCELED 104  CO2 22 - 32 mmol/L 25 CANCELED 23  Calcium 8.9 - 10.3 mg/dL 9.4 CANCELED 9.0  Total Protein - - CANCELED 7.1  Total Bilirubin - - CANCELED 0.8  Alkaline Phos - - CANCELED 73  AST - - CANCELED 28  ALT - - CANCELED 23    Lipid Panel     Component Value Date/Time   CHOL 157 04/04/2018 1103   TRIG 256 (H) 04/04/2018 1103   HDL 42 04/04/2018 1103   CHOLHDL 3.7 04/04/2018 1103   CHOLHDL 3.8 04/10/2010 0315   VLDL 32 04/10/2010 0315   LDLCALC 64 04/04/2018 1103    CBC    Component Value Date/Time   WBC  10.0 04/04/2018 1103   WBC 9.0 10/24/2017 1725   RBC 5.46 04/04/2018 1103   RBC 5.48 10/24/2017 1725   HGB 15.5 04/04/2018 1103   HCT 47.4 04/04/2018 1103   PLT 237 04/04/2018 1103   MCV 87 04/04/2018 1103   MCH 28.4 04/04/2018 1103   MCH 27.9 10/24/2017 1725   MCHC 32.7 04/04/2018 1103   MCHC 32.1 10/24/2017 1725   RDW 13.3 04/04/2018 1103   LYMPHSABS 2.3 04/04/2018 1103   MONOABS 792 05/14/2016 1641   EOSABS 0.4 04/04/2018 1103   BASOSABS 0.1 04/04/2018 1103    Lab Results  Component Value Date   HGBA1C 9.2 (A) 03/16/2018    Assessment & Plan:   1. Diabetic polyneuropathy associated with type 2 diabetes mellitus (HCC) Stable Continue gabapentin - POCT glucose (manual entry) - Hemoglobin A1c  2. Essential hypertension Uncontrolled Elevation likely due to pain No regimen change today Counseled on blood pressure goal of less than 130/80, low-sodium, DASH diet, medication compliance, 150 minutes of moderate intensity exercise per week. Discussed medication compliance, adverse effects. - losartan (COZAAR) 100 MG tablet; Take 1 tablet (100 mg total) by mouth daily.  Dispense: 30 tablet; Refill: 6  3. Chronic left shoulder pain Will need left shoulder surgery but A1c needs to be less than 8.0  4. Morbid obesity ([place]) Discussed reducing portion sizes, increasing physical activity  5. Testosterone deficiency - CBC with Differential/Platelet - PSA, total and free - Testosterone, Free, Total, SHBG   Meds ordered this encounter  Medications   losartan (COZAAR) 100 MG tablet    Sig: Take 1 tablet (100 mg total) by mouth daily.    Dispense:  30 tablet    Refill:  6    Dose increase    Follow-up: Return in about 3 months (around 09/15/2018) for Follow-up of chronic medical conditions.       [name], MD, FAAFP. [facility] [place] [facility] [place], [place]   06/15/2018, 6:32 PM

## 2018-06-16 LAB — CBC WITH DIFFERENTIAL/PLATELET
Basophils Absolute: 0.1 10*3/uL (ref 0.0–0.2)
Basos: 1 %
EOS (ABSOLUTE): 0.3 10*3/uL (ref 0.0–0.4)
Eos: 2 %
Hematocrit: 45.4 % (ref 37.5–51.0)
Hemoglobin: 14.9 g/dL (ref 13.0–17.7)
Immature Grans (Abs): 0 10*3/uL (ref 0.0–0.1)
Immature Granulocytes: 0 %
Lymphocytes Absolute: 1.9 10*3/uL (ref 0.7–3.1)
Lymphs: 18 %
MCH: 27.9 pg (ref 26.6–33.0)
MCHC: 32.8 g/dL (ref 31.5–35.7)
MCV: 85 fL (ref 79–97)
Monocytes Absolute: 0.9 10*3/uL (ref 0.1–0.9)
Monocytes: 8 %
Neutrophils Absolute: 7.7 10*3/uL — ABNORMAL HIGH (ref 1.4–7.0)
Neutrophils: 71 %
Platelets: 247 10*3/uL (ref 150–450)
RBC: 5.35 x10E6/uL (ref 4.14–5.80)
RDW: 13 % (ref 11.6–15.4)
WBC: 10.8 10*3/uL (ref 3.4–10.8)

## 2018-06-16 LAB — TESTOSTERONE, FREE, TOTAL, SHBG
Sex Hormone Binding: 41.9 nmol/L (ref 19.3–76.4)
Testosterone, Free: 8.7 pg/mL (ref 7.2–24.0)
Testosterone: 493 ng/dL (ref 264–916)

## 2018-06-16 LAB — HEMOGLOBIN A1C
Est. average glucose Bld gHb Est-mCnc: 189 mg/dL
Hgb A1c MFr Bld: 8.2 % — ABNORMAL HIGH (ref 4.8–5.6)

## 2018-06-16 LAB — PSA, TOTAL AND FREE
PSA, Free Pct: 35 %
PSA, Free: 0.07 ng/mL
Prostate Specific Ag, Serum: 0.2 ng/mL (ref 0.0–4.0)

## 2018-06-16 MED ORDER — INSULIN GLARGINE 100 UNIT/ML SOLOSTAR PEN: 54.0000 [IU] | PEN_INJECTOR | Freq: Two times a day (BID) | SUBCUTANEOUS | 6 refills | Status: DC

## 2018-06-17 ENCOUNTER — Telehealth: Payer: Self-pay

## 2018-06-17 ENCOUNTER — Telehealth (INDEPENDENT_AMBULATORY_CARE_PROVIDER_SITE_OTHER): Payer: Self-pay | Admitting: Orthopaedic Surgery

## 2018-06-17 NOTE — Telephone Encounter (Signed)
-----   Message from [name], MD sent at 06/16/2018  4:28 PM EDT ----- His A1c is 8.2 which has improved significantly from 9.2.  Please advised him to increase Lantus to 54 units twice daily for optimal glycemic control.  Blood count and PSA are normal.  Testosterone level is now normal.

## 2018-06-17 NOTE — Telephone Encounter (Signed)
Pt called in left vm said he would like a call back from liz.  [phone]

## 2018-06-17 NOTE — Telephone Encounter (Signed)
Patient name and DOB has been verified Patient was informed of lab results. Patient had no questions.  

## 2018-06-20 ENCOUNTER — Telehealth (INDEPENDENT_AMBULATORY_CARE_PROVIDER_SITE_OTHER): Payer: Self-pay | Admitting: Orthopaedic Surgery

## 2018-06-20 NOTE — Telephone Encounter (Signed)
I called patient. He wanted to discuss A1C

## 2018-06-20 NOTE — Telephone Encounter (Signed)
See message below °

## 2018-06-20 NOTE — Telephone Encounter (Signed)
Patient called to give you his A1C. Stated he knows surgeries are not taking place right now but wants a call back.  Please call back to advise

## 2018-06-20 NOTE — Telephone Encounter (Signed)
His A1c from 5 days ago is 8.2. let's recheck it in 2 months.  In the meantime, he needs to work really hard to get it below 8.0

## 2018-06-23 DIAGNOSIS — M545 Low back pain: Secondary | ICD-10-CM | POA: Diagnosis not present

## 2018-06-23 DIAGNOSIS — M25512 Pain in left shoulder: Secondary | ICD-10-CM | POA: Diagnosis not present

## 2018-06-23 DIAGNOSIS — M25511 Pain in right shoulder: Secondary | ICD-10-CM | POA: Diagnosis not present

## 2018-06-23 DIAGNOSIS — G894 Chronic pain syndrome: Secondary | ICD-10-CM | POA: Diagnosis not present

## 2018-06-27 ENCOUNTER — Ambulatory Visit: Payer: Medicaid Other | Attending: Family Medicine | Admitting: Emergency Medicine

## 2018-06-27 ENCOUNTER — Other Ambulatory Visit: Payer: Self-pay

## 2018-06-27 DIAGNOSIS — E291 Testicular hypofunction: Secondary | ICD-10-CM

## 2018-06-27 DIAGNOSIS — E349 Endocrine disorder, unspecified: Secondary | ICD-10-CM

## 2018-06-27 NOTE — Progress Notes (Signed)
Patient arrived ambulatory, alert and orientated to clinic.  Patient is in clinic for testosterone injection.

## 2018-07-07 ENCOUNTER — Telehealth: Payer: Self-pay

## 2018-07-07 NOTE — Telephone Encounter (Signed)
PA for lidocaine patches was denied because patient has not tried and failed both formulary alternatives (duloxetine and gabapentin).

## 2018-07-12 ENCOUNTER — Telehealth (INDEPENDENT_AMBULATORY_CARE_PROVIDER_SITE_OTHER): Payer: Self-pay | Admitting: Orthopaedic Surgery

## 2018-07-12 ENCOUNTER — Telehealth: Payer: Self-pay | Admitting: Family Medicine

## 2018-07-12 NOTE — Telephone Encounter (Signed)
Yes it needs to be 8.0 or below.

## 2018-07-12 NOTE — Telephone Encounter (Signed)
See message.

## 2018-07-12 NOTE — Telephone Encounter (Signed)
Patient wants to know if he needs his A1C down before Dr Erlinda Hong will consider surgery. He will have his pcp check it an as soon as it's down he will fax over, because he wants to be put on the top of the list once they start beck with elective surgery.

## 2018-07-12 NOTE — Telephone Encounter (Signed)
Patient called stating he would like to get his A1C checked. Patient states that in order to have is surgery he is to lower his a1c to a 8. Please follow up.

## 2018-07-13 NOTE — Telephone Encounter (Signed)
Can patient just come in to clinic to get A1C done. Last A1C was last month

## 2018-07-13 NOTE — Telephone Encounter (Signed)
Last A1c was done in 06/15/2018.  It is too soon to get another A1c as A1c's are done typically every 3 months.

## 2018-07-13 NOTE — Telephone Encounter (Signed)
The life span of red blood cells is 120 days hence no indication and benefit of repeat A1c in 1 month.

## 2018-07-13 NOTE — Telephone Encounter (Signed)
Patient was called and informed of PCP response. Patient states that he wants an A1C done sooner than 3 months so that he can get on the list to get his surgery done.

## 2018-07-15 NOTE — Telephone Encounter (Signed)
Called Patient back advised and he will call us once they do A1C and will call us with results.

## 2018-07-18 NOTE — Telephone Encounter (Signed)
Patient was called and informed of PCP response. 

## 2018-07-21 DIAGNOSIS — M545 Low back pain: Secondary | ICD-10-CM | POA: Diagnosis not present

## 2018-07-21 DIAGNOSIS — M5412 Radiculopathy, cervical region: Secondary | ICD-10-CM | POA: Diagnosis not present

## 2018-07-21 DIAGNOSIS — M542 Cervicalgia: Secondary | ICD-10-CM | POA: Diagnosis not present

## 2018-07-21 DIAGNOSIS — M25571 Pain in right ankle and joints of right foot: Secondary | ICD-10-CM | POA: Diagnosis not present

## 2018-08-18 DIAGNOSIS — M542 Cervicalgia: Secondary | ICD-10-CM | POA: Diagnosis not present

## 2018-08-18 DIAGNOSIS — M25512 Pain in left shoulder: Secondary | ICD-10-CM | POA: Diagnosis not present

## 2018-08-18 DIAGNOSIS — M545 Low back pain: Secondary | ICD-10-CM | POA: Diagnosis not present

## 2018-08-18 DIAGNOSIS — M25511 Pain in right shoulder: Secondary | ICD-10-CM | POA: Diagnosis not present

## 2018-09-15 ENCOUNTER — Other Ambulatory Visit: Payer: Self-pay | Admitting: Family Medicine

## 2018-09-15 DIAGNOSIS — M25512 Pain in left shoulder: Secondary | ICD-10-CM | POA: Diagnosis not present

## 2018-09-15 DIAGNOSIS — M542 Cervicalgia: Secondary | ICD-10-CM | POA: Diagnosis not present

## 2018-09-15 DIAGNOSIS — M25511 Pain in right shoulder: Secondary | ICD-10-CM | POA: Diagnosis not present

## 2018-09-15 DIAGNOSIS — M5412 Radiculopathy, cervical region: Secondary | ICD-10-CM | POA: Diagnosis not present

## 2018-09-21 ENCOUNTER — Ambulatory Visit: Payer: Medicaid Other | Attending: Family Medicine | Admitting: Family Medicine

## 2018-09-21 ENCOUNTER — Ambulatory Visit (HOSPITAL_BASED_OUTPATIENT_CLINIC_OR_DEPARTMENT_OTHER): Payer: Medicaid Other | Admitting: Pharmacist

## 2018-09-21 ENCOUNTER — Other Ambulatory Visit: Payer: Self-pay

## 2018-09-21 ENCOUNTER — Encounter: Payer: Self-pay | Admitting: Pharmacist

## 2018-09-21 ENCOUNTER — Encounter: Payer: Self-pay | Admitting: Family Medicine

## 2018-09-21 VITALS — BP 136/80 | HR 70 | Temp 98.1°F | Ht 75.0 in | Wt 343.0 lb

## 2018-09-21 DIAGNOSIS — E669 Obesity, unspecified: Secondary | ICD-10-CM | POA: Diagnosis not present

## 2018-09-21 DIAGNOSIS — Z7952 Long term (current) use of systemic steroids: Secondary | ICD-10-CM | POA: Insufficient documentation

## 2018-09-21 DIAGNOSIS — Z8249 Family history of ischemic heart disease and other diseases of the circulatory system: Secondary | ICD-10-CM | POA: Insufficient documentation

## 2018-09-21 DIAGNOSIS — Z833 Family history of diabetes mellitus: Secondary | ICD-10-CM | POA: Diagnosis not present

## 2018-09-21 DIAGNOSIS — I480 Paroxysmal atrial fibrillation: Secondary | ICD-10-CM | POA: Insufficient documentation

## 2018-09-21 DIAGNOSIS — E1142 Type 2 diabetes mellitus with diabetic polyneuropathy: Secondary | ICD-10-CM

## 2018-09-21 DIAGNOSIS — E1165 Type 2 diabetes mellitus with hyperglycemia: Secondary | ICD-10-CM | POA: Insufficient documentation

## 2018-09-21 DIAGNOSIS — I10 Essential (primary) hypertension: Secondary | ICD-10-CM | POA: Insufficient documentation

## 2018-09-21 DIAGNOSIS — Z79899 Other long term (current) drug therapy: Secondary | ICD-10-CM | POA: Insufficient documentation

## 2018-09-21 DIAGNOSIS — Z79890 Hormone replacement therapy (postmenopausal): Secondary | ICD-10-CM

## 2018-09-21 DIAGNOSIS — Z6841 Body Mass Index (BMI) 40.0 and over, adult: Secondary | ICD-10-CM | POA: Insufficient documentation

## 2018-09-21 DIAGNOSIS — M5441 Lumbago with sciatica, right side: Secondary | ICD-10-CM | POA: Diagnosis not present

## 2018-09-21 DIAGNOSIS — E11610 Type 2 diabetes mellitus with diabetic neuropathic arthropathy: Secondary | ICD-10-CM | POA: Diagnosis not present

## 2018-09-21 DIAGNOSIS — G8929 Other chronic pain: Secondary | ICD-10-CM | POA: Diagnosis not present

## 2018-09-21 DIAGNOSIS — Z89422 Acquired absence of other left toe(s): Secondary | ICD-10-CM | POA: Insufficient documentation

## 2018-09-21 DIAGNOSIS — Z96651 Presence of right artificial knee joint: Secondary | ICD-10-CM | POA: Insufficient documentation

## 2018-09-21 DIAGNOSIS — Z794 Long term (current) use of insulin: Secondary | ICD-10-CM

## 2018-09-21 DIAGNOSIS — M5442 Lumbago with sciatica, left side: Secondary | ICD-10-CM | POA: Diagnosis not present

## 2018-09-21 DIAGNOSIS — Z1211 Encounter for screening for malignant neoplasm of colon: Secondary | ICD-10-CM | POA: Diagnosis not present

## 2018-09-21 DIAGNOSIS — Z7901 Long term (current) use of anticoagulants: Secondary | ICD-10-CM | POA: Diagnosis not present

## 2018-09-21 DIAGNOSIS — IMO0002 Reserved for concepts with insufficient information to code with codable children: Secondary | ICD-10-CM

## 2018-09-21 LAB — POCT GLYCOSYLATED HEMOGLOBIN (HGB A1C): HbA1c, POC (controlled diabetic range): 8.5 % — AB (ref 0.0–7.0)

## 2018-09-21 LAB — GLUCOSE, POCT (MANUAL RESULT ENTRY): POC Glucose: 181 mg/dl — AB (ref 70–99)

## 2018-09-21 MED ORDER — LOSARTAN POTASSIUM 100 MG PO TABS: 100.0000 mg | ORAL_TABLET | Freq: Every day | ORAL | 6 refills | Status: DC

## 2018-09-21 MED ORDER — APIXABAN 5 MG PO TABS: 5.0000 mg | ORAL_TABLET | Freq: Two times a day (BID) | ORAL | 6 refills | Status: DC

## 2018-09-21 MED ORDER — LANTUS SOLOSTAR 100 UNIT/ML ~~LOC~~ SOPN: 54.0000 [IU] | PEN_INJECTOR | Freq: Two times a day (BID) | SUBCUTANEOUS | 6 refills | Status: DC

## 2018-09-21 MED ORDER — TIZANIDINE HCL 4 MG PO TABS: 4.0000 mg | ORAL_TABLET | Freq: Every day | ORAL | 1 refills | Status: DC

## 2018-09-21 MED ORDER — METOPROLOL TARTRATE 100 MG PO TABS: 100.0000 mg | ORAL_TABLET | Freq: Two times a day (BID) | ORAL | 1 refills | Status: DC

## 2018-09-21 MED ORDER — ATORVASTATIN CALCIUM 20 MG PO TABS: 20.0000 mg | ORAL_TABLET | Freq: Every day | ORAL | 1 refills | Status: DC

## 2018-09-21 MED ORDER — GABAPENTIN 300 MG PO CAPS: 300.0000 mg | ORAL_CAPSULE | Freq: Every day | ORAL | 1 refills | Status: DC

## 2018-09-21 MED ORDER — VICTOZA 18 MG/3ML ~~LOC~~ SOPN: PEN_INJECTOR | SUBCUTANEOUS | 3 refills | Status: DC

## 2018-09-21 NOTE — Progress Notes (Signed)
Patient was educated on the use of the Victoza pen. Reviewed necessary supplies and operation of the pen. Also reviewed goal blood glucose levels. Patient was able to demonstrate use. All questions and concerns were addressed. Emphasized that the Victoza may lower his requirement of insulin, however, he is to remain on his current insulin regimen until he is seen again by his PCP.

## 2018-09-21 NOTE — Progress Notes (Signed)
Subjective:  Patient ID: Jeffery [name], male    DOB: 10-01-1960  Age: 58 y.o. MRN: [id number]  CC: Diabetes   HPI MEHTAAB MAYEDA  is a 58 year old male with history of type 2 diabetes mellitus (A1c 8.5), hypertension, Charcot foot due to diabetes mellitus, Paroxysmal A. fib (currently on rate control with metoprolol and anticoagulation with Eliquis), status post right rotator cuff repair who presents today for follow-up visit.  His A1c is 8.5 today and he is unhappy as he needs his A1c to be less than 8.5 for left shoulder rotator cuff surgery.  Pain is so severe he is unable to sleep on his left side, unable to insert his belt through the hoops of his pants.  Pain is alleviated by use of narcotics which he receives from the pain clinic.  With regards to his diabetes mellitus his fasting sugars have been in the mid 100s and he endorses compliance with his medications but has not been exercising and compliance with a diabetic diet cannot be ascertained.  He is not up-to-date on annual eye exam but denies blurry vision or other visual complaints.  His neuropathy is controlled on gabapentin.  He has Charcot foot and was seen by podiatry however informs me his insurance would not cover the shoes he required. He denies bleeding from Eliquis and is compliant with his antihypertensive and statin and denies other adverse effects. He has no additional concerns today.  Past Medical History:  Diagnosis Date  . Atrial fibrillation ([place])   . Charcot's joint of right foot   . DDD (degenerative disc disease), lumbar   . Diabetes mellitus without complication ([place])   . Fatty liver   . Hypertension   . Obesity   . Rotator cuff disorder   . Shoulder impingement, right     Past Surgical History:  Procedure Laterality Date  . AMPUTATION Left 10/02/2014   Procedure: Left Third toe amputation ;  Surgeon: [name], MD;  Location: [facility];  Service: Orthopedics;  Laterality: Left;  Regular bed,  wants to follow hip  . ANTERIOR CRUCIATE LIGAMENT REPAIR Right 90   reconstruction  . APPLICATION OF WOUND VAC Left 10/02/2014   Procedure: APPLICATION OF WOUND VAC; toe Surgeon: [name], MD;  Location: [facility];  Service: Orthopedics;  Laterality: Left;  . I&D EXTREMITY Left 10/05/2014   Procedure: IRRIGATION AND DEBRIDEMENT LEFT FOOT;  Surgeon: [name], MD;  Location: [facility];  Service: Orthopedics;  Laterality: Left;  . KNEE ARTHROSCOPY W/ ACL RECONSTRUCTION Right   . TOTAL KNEE ARTHROPLASTY Right 03/28/2015  . TOTAL KNEE ARTHROPLASTY Right 03/28/2015   Procedure: RIGHT TOTAL KNEE ARTHROPLASTY;  Surgeon: [name], MD;  Location: [facility];  Service: Orthopedics;  Laterality: Right;    Family History  Problem Relation Age of Onset  . Diabetes Father   . Hypertension Father   . Heart failure Father     No Known Allergies  Outpatient Medications Prior to Visit  Medication Sig Dispense Refill  . Blood Glucose Monitoring Suppl (TRUE METRIX METER) DEVI 1 each by Does not apply route 3 (three) times daily before meals. 1 Device 0  . glucose blood (TRUE METRIX BLOOD GLUCOSE TEST) test strip Use 3 times daily before meals 100 each 12  . HUMALOG 100 UNIT/ML injection INJECT 7 UNITS  (.07ML) INTO SKIN THREE TIMES A DAY BEFORE MEALS 10 mL 2  . Insulin Pen Needle (B-D ULTRAFINE III SHORT PEN) 31G X 8  MM MISC 1 each by Does not apply route 3 (three) times daily. 100 each 5  . Insulin Syringe-Needle U-100 (TRUEPLUS INSULIN SYRINGE) 30G X 5/16" 0.5 ML MISC Use as directed 3 times daily 100 each 5  . oxyCODONE-acetaminophen (PERCOCET) 5-325 MG tablet 1-2 tabs po bid prn pain 30 tablet 0  . sodium chloride (OCEAN) 0.65 % SOLN nasal spray Place 1 spray into both nostrils 2 (two) times daily as needed for congestion.    Marland Kitchen testosterone cypionate (DEPOTESTOSTERONE CYPIONATE) 200 MG/ML injection Inject 1 mL (200 mg total) into the muscle every 28 (twenty-eight) days. 10 mL 3  . TRUEPLUS INSULIN SYRINGE  30G X 5/16" 0.5 ML MISC USE AS DIRECTED 3 TIMES DAILY 100 each 0  . TRUEPLUS LANCETS 28G MISC 1 each by Does not apply route 3 (three) times daily before meals. 100 each 12  . apixaban (ELIQUIS) 5 MG TABS tablet Take 1 tablet (5 mg total) by mouth 2 (two) times daily. 60 tablet 6  . atorvastatin (LIPITOR) 20 MG tablet Take 1 tablet (20 mg total) by mouth daily. 90 tablet 1  . gabapentin (NEURONTIN) 300 MG capsule Take 1 capsule (300 mg total) by mouth at bedtime. 90 capsule 1  . Insulin Glargine (LANTUS SOLOSTAR) 100 UNIT/ML Solostar Pen Inject 54 Units into the skin 2 (two) times daily. 30 mL 6  . losartan (COZAAR) 100 MG tablet Take 1 tablet (100 mg total) by mouth daily. 30 tablet 6  . metoprolol tartrate (LOPRESSOR) 100 MG tablet Take 1 tablet (100 mg total) by mouth 2 (two) times daily. 180 tablet 1  . tiZANidine (ZANAFLEX) 4 MG tablet TAKE ONE TABLET BY MOUTH EVERY NIGHT AT BEDTIME 90 tablet 1  . lidocaine (LIDODERM) 5 % PLACE 1 PATCH DAILY ONTO THE SKIN. REMOVE AND DISCARD PATCH WITHIN 12 HOURS OR AS DIRECTED BY MD. (Patient not taking: Reported on 09/21/2018) 30 patch 0  . amoxicillin (AMOXIL) 500 MG capsule Take 4 caps one hour prior to dental work (Patient not taking: Reported on 09/21/2018) 12 capsule 0   No facility-administered medications prior to visit.      ROS Review of Systems  Constitutional: Negative for activity change and appetite change.  HENT: Negative for sinus pressure and sore throat.   Eyes: Negative for visual disturbance.  Respiratory: Negative for cough, chest tightness and shortness of breath.   Cardiovascular: Negative for chest pain and leg swelling.  Gastrointestinal: Negative for abdominal distention, abdominal pain, constipation and diarrhea.  Endocrine: Negative.   Genitourinary: Negative for dysuria.  Musculoskeletal:       See hpi  Skin: Negative for rash.  Allergic/Immunologic: Negative.   Neurological: Negative for weakness, light-headedness and  numbness.  Psychiatric/Behavioral: Negative for dysphoric mood and suicidal ideas.    Objective:  BP 136/80   Pulse 70   Temp 98.1 F (36.7 C) (Oral)   Ht 6' 3"  (1.905 m)   Wt (!) 343 lb (155.6 kg)   SpO2 97%   BMI 42.87 kg/m   BP/Weight 09/21/2018 06/15/2018 2/70/6237  Systolic BP 628 315 -  Diastolic BP 80 99 -  Wt. (Lbs) 343 340 332  BMI 42.87 42.5 41.5      Physical Exam Constitutional:      Appearance: He is well-developed.  Cardiovascular:     Rate and Rhythm: Normal rate.     Heart sounds: Normal heart sounds. No murmur.  Pulmonary:     Effort: Pulmonary effort is normal.  Breath sounds: Normal breath sounds. No wheezing or rales.  Chest:     Chest wall: No tenderness.  Abdominal:     General: Bowel sounds are normal. There is no distension.     Palpations: Abdomen is soft. There is no mass.     Tenderness: There is no abdominal tenderness.  Musculoskeletal:     Comments: Abduction of left upper extremity restricted to 90 degrees  Neurological:     Mental Status: He is alert and oriented to person, place, and time.     CMP Latest Ref Rng & Units 10/24/2017 04/15/2017 03/26/2017  Glucose 70 - 99 mg/dL 277(H) CANCELED 107(H)  BUN 6 - 20 mg/dL 15 CANCELED 12  Creatinine 0.61 - 1.24 mg/dL 1.13 CANCELED 0.73  Sodium 135 - 145 mmol/L 134(L) CANCELED 137  Potassium 3.5 - 5.1 mmol/L 4.6 CANCELED 4.7  Chloride 98 - 111 mmol/L 101 CANCELED 104  CO2 22 - 32 mmol/L 25 CANCELED 23  Calcium 8.9 - 10.3 mg/dL 9.4 CANCELED 9.0  Total Protein - - CANCELED 7.1  Total Bilirubin - - CANCELED 0.8  Alkaline Phos - - CANCELED 73  AST - - CANCELED 28  ALT - - CANCELED 23    Lipid Panel     Component Value Date/Time   CHOL 157 04/04/2018 1103   TRIG 256 (H) 04/04/2018 1103   HDL 42 04/04/2018 1103   CHOLHDL 3.7 04/04/2018 1103   CHOLHDL 3.8 04/10/2010 0315   VLDL 32 04/10/2010 0315   LDLCALC 64 04/04/2018 1103    CBC    Component Value Date/Time   WBC 10.8  06/15/2018 1645   WBC 9.0 10/24/2017 1725   RBC 5.35 06/15/2018 1645   RBC 5.48 10/24/2017 1725   HGB 14.9 06/15/2018 1645   HCT 45.4 06/15/2018 1645   PLT 247 06/15/2018 1645   MCV 85 06/15/2018 1645   MCH 27.9 06/15/2018 1645   MCH 27.9 10/24/2017 1725   MCHC 32.8 06/15/2018 1645   MCHC 32.1 10/24/2017 1725   RDW 13.0 06/15/2018 1645   LYMPHSABS 1.9 06/15/2018 1645   MONOABS 792 05/14/2016 1641   EOSABS 0.3 06/15/2018 1645   BASOSABS 0.1 06/15/2018 1645    Lab Results  Component Value Date   HGBA1C 8.5 (A) 09/21/2018    Assessment & Plan:   1. Diabetic polyneuropathy associated with type 2 diabetes mellitus ([place]) Uncontrolled with A1c of 8.5 which has trended up from 8.2 Victoza added to regimen for cardiovascular benefit and weight loss Clinical pharmacist called into discussed titrating instructions If his blood sugars begin to bottom out we will back off on his NovoLog with the goal of completely discontinuing the use Continue Lantus at current dose Counseled on Diabetic diet, my plate method, 619 minutes of moderate intensity exercise/week Keep blood sugar logs with fasting goals of 80-120 mg/dl, random of less than 180 and in the event of sugars less than 60 mg/dl or greater than 400 mg/dl please notify the clinic ASAP. It is recommended that you undergo annual eye exams and annual foot exams. Pneumonia vaccine is recommended. - POCT glucose (manual entry) - POCT glycosylated hemoglobin (Hb A1C) - CMP14+EGFR - liraglutide (VICTOZA) 18 MG/3ML SOPN; Inject subcutaneously daily at breakfast 0.6 mL x1 week then 1.2 mL x 1 week then 1.8 thereafter  Dispense: 30 mL; Refill: 3  2. Essential hypertension Controlled Counseled on Diabetic diet, my plate method, 509 minutes of moderate intensity exercise/week Keep blood sugar logs with fasting goals of 80-120  mg/dl, random of less than 180 and in the event of sugars less than 60 mg/dl or greater than 400 mg/dl please notify  the clinic ASAP. It is recommended that you undergo annual eye exams and annual foot exams. Pneumonia vaccine is recommended. - metoprolol tartrate (LOPRESSOR) 100 MG tablet; Take 1 tablet (100 mg total) by mouth 2 (two) times daily.  Dispense: 180 tablet; Refill: 1 - losartan (COZAAR) 100 MG tablet; Take 1 tablet (100 mg total) by mouth daily.  Dispense: 30 tablet; Refill: 6  3. Chronic midline low back pain with bilateral sciatica Stable Followed by pain management - gabapentin (NEURONTIN) 300 MG capsule; Take 1 capsule (300 mg total) by mouth at bedtime.  Dispense: 90 capsule; Refill: 1  4. Uncontrolled type 2 diabetes mellitus with complication, with long-term current use of insulin ([facility]) Uncontrolled See #1 above - atorvastatin (LIPITOR) 20 MG tablet; Take 1 tablet (20 mg total) by mouth daily.  Dispense: 90 tablet; Refill: 1 - Insulin Glargine (LANTUS SOLOSTAR) 100 UNIT/ML Solostar Pen; Inject 54 Units into the skin 2 (two) times daily.  Dispense: 30 mL; Refill: 6  5. Screening for colon cancer - Ambulatory referral to Gastroenterology  6. Long-term current use of testosterone replacement therapy - CBC with Differential/Platelet - PSA, total and free  7. Paroxysmal atrial fibrillation ([facility]) Stable - apixaban (ELIQUIS) 5 MG TABS tablet; Take 1 tablet (5 mg total) by mouth 2 (two) times daily.  Dispense: 60 tablet; Refill: 6   Meds ordered this encounter  Medications  . apixaban (ELIQUIS) 5 MG TABS tablet    Sig: Take 1 tablet (5 mg total) by mouth 2 (two) times daily.    Dispense:  60 tablet    Refill:  6  . metoprolol tartrate (LOPRESSOR) 100 MG tablet    Sig: Take 1 tablet (100 mg total) by mouth 2 (two) times daily.    Dispense:  180 tablet    Refill:  1  . gabapentin (NEURONTIN) 300 MG capsule    Sig: Take 1 capsule (300 mg total) by mouth at bedtime.    Dispense:  90 capsule    Refill:  1  . liraglutide (VICTOZA) 18 MG/3ML SOPN    Sig: Inject subcutaneously daily  at breakfast 0.6 mL x1 week then 1.2 mL x 1 week then 1.8 thereafter    Dispense:  30 mL    Refill:  3  . tiZANidine (ZANAFLEX) 4 MG tablet    Sig: Take 1 tablet (4 mg total) by mouth at bedtime.    Dispense:  90 tablet    Refill:  1  . losartan (COZAAR) 100 MG tablet    Sig: Take 1 tablet (100 mg total) by mouth daily.    Dispense:  30 tablet    Refill:  6  . atorvastatin (LIPITOR) 20 MG tablet    Sig: Take 1 tablet (20 mg total) by mouth daily.    Dispense:  90 tablet    Refill:  1  . Insulin Glargine (LANTUS SOLOSTAR) 100 UNIT/ML Solostar Pen    Sig: Inject 54 Units into the skin 2 (two) times daily.    Dispense:  30 mL    Refill:  6    Follow-up: 3 months      [name], MD, FAAFP. [facility] [place], [place]   09/21/2018, 11:49 AM

## 2018-09-22 LAB — CBC WITH DIFFERENTIAL/PLATELET
Basophils Absolute: 0 10*3/uL (ref 0.0–0.2)
Basos: 1 %
EOS (ABSOLUTE): 0.4 10*3/uL (ref 0.0–0.4)
Eos: 5 %
Hematocrit: 44.5 % (ref 37.5–51.0)
Hemoglobin: 14.1 g/dL (ref 13.0–17.7)
Immature Grans (Abs): 0 10*3/uL (ref 0.0–0.1)
Immature Granulocytes: 1 %
Lymphocytes Absolute: 1.7 10*3/uL (ref 0.7–3.1)
Lymphs: 22 %
MCH: 27.6 pg (ref 26.6–33.0)
MCHC: 31.7 g/dL (ref 31.5–35.7)
MCV: 87 fL (ref 79–97)
Monocytes Absolute: 0.6 10*3/uL (ref 0.1–0.9)
Monocytes: 8 %
Neutrophils Absolute: 5 10*3/uL (ref 1.4–7.0)
Neutrophils: 63 %
Platelets: 197 10*3/uL (ref 150–450)
RBC: 5.11 x10E6/uL (ref 4.14–5.80)
RDW: 14.1 % (ref 11.6–15.4)
WBC: 7.8 10*3/uL (ref 3.4–10.8)

## 2018-09-22 LAB — CMP14+EGFR
ALT: 18 IU/L (ref 0–44)
AST: 12 IU/L (ref 0–40)
Albumin/Globulin Ratio: 1.6 (ref 1.2–2.2)
Albumin: 4.1 g/dL (ref 3.8–4.9)
Alkaline Phosphatase: 76 IU/L (ref 39–117)
BUN/Creatinine Ratio: 15 (ref 9–20)
BUN: 15 mg/dL (ref 6–24)
Bilirubin Total: 0.3 mg/dL (ref 0.0–1.2)
CO2: 24 mmol/L (ref 20–29)
Calcium: 9.1 mg/dL (ref 8.7–10.2)
Chloride: 103 mmol/L (ref 96–106)
Creatinine, Ser: 1.03 mg/dL (ref 0.76–1.27)
GFR calc Af Amer: 93 mL/min/{1.73_m2} (ref >59)
GFR calc non Af Amer: 81 mL/min/{1.73_m2} (ref >59)
Globulin, Total: 2.6 g/dL (ref 1.5–4.5)
Glucose: 164 mg/dL — ABNORMAL HIGH (ref 65–99)
Potassium: 4.4 mmol/L (ref 3.5–5.2)
Sodium: 137 mmol/L (ref 134–144)
Total Protein: 6.7 g/dL (ref 6.0–8.5)

## 2018-09-22 LAB — PSA, TOTAL AND FREE
PSA, Free Pct: 12.5 %
PSA, Free: 0.05 ng/mL
Prostate Specific Ag, Serum: 0.4 ng/mL (ref 0.0–4.0)

## 2018-09-23 ENCOUNTER — Telehealth: Payer: Self-pay

## 2018-09-23 NOTE — Telephone Encounter (Signed)
-----   Message from [name], MD sent at 09/22/2018  2:27 PM EDT ----- Please inform the patient that labs are normal. Thank you.

## 2018-09-23 NOTE — Telephone Encounter (Signed)
Patient name and DOB has been verified Patient was informed of lab results. Patient had no questions.  

## 2018-09-26 ENCOUNTER — Telehealth: Payer: Self-pay | Admitting: Family Medicine

## 2018-09-26 NOTE — Telephone Encounter (Signed)
Can you take care of this please?? 

## 2018-09-26 NOTE — Telephone Encounter (Signed)
New Message  Pt states that he needs a prior authorization for a prescription that was called in for Victoza. Pleas ef/u

## 2018-09-28 NOTE — Telephone Encounter (Signed)
PA approved, pharmacy notified.

## 2018-10-12 ENCOUNTER — Telehealth: Payer: Self-pay | Admitting: Family Medicine

## 2018-10-12 DIAGNOSIS — M25511 Pain in right shoulder: Secondary | ICD-10-CM | POA: Diagnosis not present

## 2018-10-12 DIAGNOSIS — M542 Cervicalgia: Secondary | ICD-10-CM | POA: Diagnosis not present

## 2018-10-12 DIAGNOSIS — M25512 Pain in left shoulder: Secondary | ICD-10-CM | POA: Diagnosis not present

## 2018-10-12 DIAGNOSIS — M545 Low back pain: Secondary | ICD-10-CM | POA: Diagnosis not present

## 2018-10-12 MED ORDER — ACCU-CHEK ACTIVE VI STRP: ORAL_STRIP | 12 refills | Status: DC

## 2018-10-12 MED ORDER — ACCU-CHEK GUIDE ME W/DEVICE KIT: 1.0000 | PACK | Freq: Two times a day (BID) | 0 refills | Status: DC

## 2018-10-12 NOTE — Telephone Encounter (Signed)
Pt was placed on Victoza and still on Lantus and Novolog He began mowing his grass and had a cold sweat d/t low blood sugar. He has been having low blood sugars know by cold sweats.  He is currently using VerioOneTouch meter and strips.Since he is testing more he request a meter and strips that Medicaid would cover.   He asked if he should dial back on the Lantus or Humolog He starts 18 tomorrow.  Jeffery [name]

## 2018-10-12 NOTE — Telephone Encounter (Signed)
Patient called wanting to know if there can be a dosage adjustment on how much insulin he is taking. Patient states his sugar was 85 after eating breakfast and was feeling cold sweats. Please follow up.

## 2018-10-12 NOTE — Telephone Encounter (Signed)
Decrease Lantus to 50 units bid I would like him to go to 1.65ml of Victoza. I also like to know his blood sugars. Thanks

## 2018-10-12 NOTE — Telephone Encounter (Signed)
Advised to go to the UC or ED if symptoms worsen.

## 2018-10-14 MED ORDER — ACCU-CHEK AVIVA DEVI: 0 refills | Status: DC

## 2018-10-14 MED ORDER — ACCU-CHEK AVIVA VI STRP: ORAL_STRIP | 12 refills | Status: DC

## 2018-10-14 NOTE — Telephone Encounter (Addendum)
Patient aware of message per Dr. Margarita Rana.  States that insurance would not pay for the meter that was sent. Please send another.   Spoke to [employer] and was instructed to send send [place]. This brand was send to [facility] pharmacy.

## 2018-10-18 ENCOUNTER — Other Ambulatory Visit: Payer: Self-pay | Admitting: Pharmacist

## 2018-10-18 DIAGNOSIS — IMO0002 Reserved for concepts with insufficient information to code with codable children: Secondary | ICD-10-CM

## 2018-10-18 DIAGNOSIS — E1165 Type 2 diabetes mellitus with hyperglycemia: Secondary | ICD-10-CM

## 2018-10-18 MED ORDER — ACCU-CHEK AVIVA VI STRP: ORAL_STRIP | 12 refills | Status: DC

## 2018-10-18 MED ORDER — ACCU-CHEK FASTCLIX LANCETS MISC: 11 refills | Status: AC

## 2018-10-18 MED ORDER — ACCU-CHEK AVIVA DEVI: 0 refills | Status: AC

## 2018-11-09 DIAGNOSIS — M542 Cervicalgia: Secondary | ICD-10-CM | POA: Diagnosis not present

## 2018-11-09 DIAGNOSIS — M25511 Pain in right shoulder: Secondary | ICD-10-CM | POA: Diagnosis not present

## 2018-11-09 DIAGNOSIS — M25572 Pain in left ankle and joints of left foot: Secondary | ICD-10-CM | POA: Diagnosis not present

## 2018-11-09 DIAGNOSIS — M25571 Pain in right ankle and joints of right foot: Secondary | ICD-10-CM | POA: Diagnosis not present

## 2018-12-09 DIAGNOSIS — M545 Low back pain: Secondary | ICD-10-CM | POA: Diagnosis not present

## 2018-12-09 DIAGNOSIS — M25512 Pain in left shoulder: Secondary | ICD-10-CM | POA: Diagnosis not present

## 2018-12-09 DIAGNOSIS — E1143 Type 2 diabetes mellitus with diabetic autonomic (poly)neuropathy: Secondary | ICD-10-CM | POA: Diagnosis not present

## 2018-12-09 DIAGNOSIS — M542 Cervicalgia: Secondary | ICD-10-CM | POA: Diagnosis not present

## 2018-12-09 DIAGNOSIS — M25511 Pain in right shoulder: Secondary | ICD-10-CM | POA: Diagnosis not present

## 2018-12-09 DIAGNOSIS — M25552 Pain in left hip: Secondary | ICD-10-CM | POA: Diagnosis not present

## 2018-12-09 DIAGNOSIS — M25571 Pain in right ankle and joints of right foot: Secondary | ICD-10-CM | POA: Diagnosis not present

## 2018-12-09 DIAGNOSIS — G894 Chronic pain syndrome: Secondary | ICD-10-CM | POA: Diagnosis not present

## 2018-12-09 DIAGNOSIS — M5412 Radiculopathy, cervical region: Secondary | ICD-10-CM | POA: Diagnosis not present

## 2018-12-09 DIAGNOSIS — Z6841 Body Mass Index (BMI) 40.0 and over, adult: Secondary | ICD-10-CM | POA: Diagnosis not present

## 2018-12-26 ENCOUNTER — Ambulatory Visit: Payer: Medicaid Other | Attending: Family Medicine | Admitting: Family Medicine

## 2018-12-26 ENCOUNTER — Encounter: Payer: Self-pay | Admitting: Family Medicine

## 2018-12-26 ENCOUNTER — Other Ambulatory Visit: Payer: Self-pay

## 2018-12-26 VITALS — BP 134/85 | HR 85 | Temp 98.0°F | Ht 75.0 in | Wt 336.0 lb

## 2018-12-26 DIAGNOSIS — K760 Fatty (change of) liver, not elsewhere classified: Secondary | ICD-10-CM | POA: Insufficient documentation

## 2018-12-26 DIAGNOSIS — M14671 Charcot's joint, right ankle and foot: Secondary | ICD-10-CM | POA: Insufficient documentation

## 2018-12-26 DIAGNOSIS — Z6841 Body Mass Index (BMI) 40.0 and over, adult: Secondary | ICD-10-CM | POA: Diagnosis not present

## 2018-12-26 DIAGNOSIS — S91111A Laceration without foreign body of right great toe without damage to nail, initial encounter: Secondary | ICD-10-CM | POA: Diagnosis not present

## 2018-12-26 DIAGNOSIS — I480 Paroxysmal atrial fibrillation: Secondary | ICD-10-CM | POA: Diagnosis not present

## 2018-12-26 DIAGNOSIS — E11649 Type 2 diabetes mellitus with hypoglycemia without coma: Secondary | ICD-10-CM | POA: Diagnosis not present

## 2018-12-26 DIAGNOSIS — I10 Essential (primary) hypertension: Secondary | ICD-10-CM

## 2018-12-26 DIAGNOSIS — E11610 Type 2 diabetes mellitus with diabetic neuropathic arthropathy: Secondary | ICD-10-CM | POA: Diagnosis not present

## 2018-12-26 DIAGNOSIS — E1142 Type 2 diabetes mellitus with diabetic polyneuropathy: Secondary | ICD-10-CM | POA: Diagnosis not present

## 2018-12-26 DIAGNOSIS — M25512 Pain in left shoulder: Secondary | ICD-10-CM | POA: Diagnosis not present

## 2018-12-26 DIAGNOSIS — M5136 Other intervertebral disc degeneration, lumbar region: Secondary | ICD-10-CM | POA: Diagnosis not present

## 2018-12-26 DIAGNOSIS — Z23 Encounter for immunization: Secondary | ICD-10-CM | POA: Diagnosis not present

## 2018-12-26 DIAGNOSIS — E669 Obesity, unspecified: Secondary | ICD-10-CM | POA: Insufficient documentation

## 2018-12-26 DIAGNOSIS — E1165 Type 2 diabetes mellitus with hyperglycemia: Secondary | ICD-10-CM

## 2018-12-26 DIAGNOSIS — Z79899 Other long term (current) drug therapy: Secondary | ICD-10-CM | POA: Insufficient documentation

## 2018-12-26 DIAGNOSIS — Z7901 Long term (current) use of anticoagulants: Secondary | ICD-10-CM | POA: Insufficient documentation

## 2018-12-26 DIAGNOSIS — G8929 Other chronic pain: Secondary | ICD-10-CM

## 2018-12-26 DIAGNOSIS — Z794 Long term (current) use of insulin: Secondary | ICD-10-CM | POA: Diagnosis not present

## 2018-12-26 LAB — POCT GLYCOSYLATED HEMOGLOBIN (HGB A1C): HbA1c, POC (controlled diabetic range): 6.2 % (ref 0.0–7.0)

## 2018-12-26 LAB — GLUCOSE, POCT (MANUAL RESULT ENTRY): POC Glucose: 116 mg/dl — AB (ref 70–99)

## 2018-12-26 MED ORDER — LANTUS SOLOSTAR 100 UNIT/ML ~~LOC~~ SOPN: 50.0000 [IU] | PEN_INJECTOR | Freq: Two times a day (BID) | SUBCUTANEOUS | 6 refills | Status: DC

## 2018-12-26 NOTE — Patient Instructions (Signed)
Hypoglycemia Hypoglycemia is when the sugar (glucose) level in your blood is too low. Signs of low blood sugar may include:  Feeling: ? Hungry. ? Worried or nervous (anxious). ? Sweaty and clammy. ? Confused. ? Dizzy. ? Sleepy. ? Sick to your stomach (nauseous).  Having: ? A fast heartbeat. ? A headache. ? A change in your vision. ? Tingling or no feeling (numbness) around your mouth, lips, or tongue. ? Jerky movements that you cannot control (seizure).  Having trouble with: ? Moving (coordination). ? Sleeping. ? Passing out (fainting). ? Getting upset easily (irritability). Low blood sugar can happen to people who have diabetes and people who do not have diabetes. Low blood sugar can happen quickly, and it can be an emergency. Treating low blood sugar Low blood sugar is often treated by eating or drinking something sugary right away, such as:  Fruit juice, 4-6 oz (120-150 mL).  Regular soda (not diet soda), 4-6 oz (120-150 mL).  Low-fat milk, 4 oz (120 mL).  Several pieces of hard candy.  Sugar or honey, 1 Tbsp (15 mL). Treating low blood sugar if you have diabetes If you can think clearly and swallow safely, follow the 15:15 rule:  Take 15 grams of a fast-acting carb (carbohydrate). Talk with your doctor about how much you should take.  Always keep a source of fast-acting carb with you, such as: ? Sugar tablets (glucose pills). Take 3-4 pills. ? 6-8 pieces of hard candy. ? 4-6 oz (120-150 mL) of fruit juice. ? 4-6 oz (120-150 mL) of regular (not diet) soda. ? 1 Tbsp (15 mL) honey or sugar.  Check your blood sugar 15 minutes after you take the carb.  If your blood sugar is still at or below 70 mg/dL (3.9 mmol/L), take 15 grams of a carb again.  If your blood sugar does not go above 70 mg/dL (3.9 mmol/L) after 3 tries, get help right away.  After your blood sugar goes back to normal, eat a meal or a snack within 1 hour.  Treating very low blood sugar If your  blood sugar is at or below 54 mg/dL (3 mmol/L), you have very low blood sugar (severe hypoglycemia). This may also cause:  Passing out.  Jerky movements you cannot control (seizure).  Losing consciousness (coma). This is an emergency. Do not wait to see if the symptoms will go away. Get medical help right away. Call your local emergency services (911 in the [place] Do not drive yourself to the hospital. If you have very low blood sugar and you cannot eat or drink, you may need a glucagon shot (injection). A family member or friend should learn how to check your blood sugar and how to give you a glucagon shot. Ask your doctor if you need to have a glucagon shot kit at home. Follow these instructions at home: General instructions  Take over-the-counter and prescription medicines only as told by your doctor.  Stay aware of your blood sugar as told by your doctor.  Limit alcohol intake to no more than 1 drink a day for nonpregnant women and 2 drinks a day for men. One drink equals 12 oz of beer (355 mL), 5 oz of wine (148 mL), or 1 oz of hard liquor (44 mL).  Keep all follow-up visits as told by your doctor. This is important. If you have diabetes:   Follow your diabetes care plan as told by your doctor. Make sure you: ? Know the signs of low blood sugar. ?  Take your medicines as told. ? Follow your exercise and meal plan. ? Eat on time. Do not skip meals. ? Check your blood sugar as often as told by your doctor. Always check it before and after exercise. ? Follow your sick day plan when you cannot eat or drink normally. Make this plan ahead of time with your doctor.  Share your diabetes care plan with: ? Your work or school. ? People you live with.  Check your pee (urine) for ketones: ? When you are sick. ? As told by your doctor.  Carry a card or wear jewelry that says you have diabetes. Contact a doctor if:  You have trouble keeping your blood sugar in your target range.   You have low blood sugar often. Get help right away if:  You still have symptoms after you eat or drink something sugary.  Your blood sugar is at or below 54 mg/dL (3 mmol/L).  You have jerky movements that you cannot control.  You pass out. These symptoms may be an emergency. Do not wait to see if the symptoms will go away. Get medical help right away. Call your local emergency services (911 in the [place] Do not drive yourself to the hospital. Summary  Hypoglycemia happens when the level of sugar (glucose) in your blood is too low.  Low blood sugar can happen to people who have diabetes and people who do not have diabetes. Low blood sugar can happen quickly, and it can be an emergency.  Make sure you know the signs of low blood sugar and know how to treat it.  Always keep a source of sugar (fast-acting carb) with you to treat low blood sugar. This information is not intended to replace advice given to you by your health care provider. Make sure you discuss any questions you have with your health care provider. Document Released: 06/10/2009 Document Revised: 07/07/2018 Document Reviewed: 04/19/2015 Elsevier Patient Education  2020 [employer]  

## 2018-12-26 NOTE — Progress Notes (Addendum)
Subjective:  Patient ID: Jeffery [name], male    DOB: 1960-07-06  Age: 58 y.o. MRN: [id number]  CC: Diabetes   HPI Jeffery [name] is a 58 year old male with history of type 2 diabetes mellitus (A1c 6.7), hypertension, Charcot foot due to diabetes mellitus, Paroxysmal A. fib (currently on rate control with metoprolol and anticoagulation with Eliquis), status post right rotator cuff repair who presents today for follow-up visit.  He is A1c 6.7 which has improved from 8.5 previously as Victoza was added to his regimen at his last visit 3 months ago.  He has noticed some episodes of breaking out in cold sweats in the morning with his sugars at 77 after eating a snack.  This led him to decrease Lantus from 54 units twice daily to 52 units twice daily.  His highest sugars are around 220.  His neuropathy is controlled on gabapentin and he denies visual concerns. Compliant with his antihypertensive and is doing well on his anticoagulant for A. fib with no complaints of chest pains or dyspnea. He sustained a laceration to his right big toe this morning and applied a Band-Aid to it. His left shoulder is severely painful and he is excited about his A1c as his shoulder surgery has been placed on hold by orthopedics due to poor glycemic control.  Past Medical History:  Diagnosis Date  . Atrial fibrillation ([place])   . Charcot's joint of right foot   . DDD (degenerative disc disease), lumbar   . Diabetes mellitus without complication ([place])   . Fatty liver   . Hypertension   . Obesity   . Rotator cuff disorder   . Shoulder impingement, right     Past Surgical History:  Procedure Laterality Date  . AMPUTATION Left 10/02/2014   Procedure: Left Third toe amputation ;  Surgeon: [name], MD;  Location: [facility];  Service: Orthopedics;  Laterality: Left;  Regular bed, wants to follow hip  . ANTERIOR CRUCIATE LIGAMENT REPAIR Right 90   reconstruction  . APPLICATION OF WOUND VAC Left 10/02/2014   Procedure: APPLICATION OF WOUND VAC; toe Surgeon: [name], MD;  Location: [facility];  Service: Orthopedics;  Laterality: Left;  . I&D EXTREMITY Left 10/05/2014   Procedure: IRRIGATION AND DEBRIDEMENT LEFT FOOT;  Surgeon: [name], MD;  Location: [facility];  Service: Orthopedics;  Laterality: Left;  . KNEE ARTHROSCOPY W/ ACL RECONSTRUCTION Right   . TOTAL KNEE ARTHROPLASTY Right 03/28/2015  . TOTAL KNEE ARTHROPLASTY Right 03/28/2015   Procedure: RIGHT TOTAL KNEE ARTHROPLASTY;  Surgeon: [name], MD;  Location: [facility];  Service: Orthopedics;  Laterality: Right;    Family History  Problem Relation Age of Onset  . Diabetes Father   . Hypertension Father   . Heart failure Father     Allergies  Allergen Reactions  . Metformin And Related Other (See Comments)    GI upset    Outpatient Medications Prior to Visit  Medication Sig Dispense Refill  . Accu-Chek FastClix Lancets MISC Use as directed to check blood sugar at least twice daily. E11.8 E11.65 Z79.4 102 each 11  . apixaban (ELIQUIS) 5 MG TABS tablet Take 1 tablet (5 mg total) by mouth 2 (two) times daily. 60 tablet 6  . atorvastatin (LIPITOR) 20 MG tablet Take 1 tablet (20 mg total) by mouth daily. 90 tablet 1  . Blood Glucose Monitoring Suppl (ACCU-CHEK AVIVA) device Use as instructed to check blood sugar 2 times daily. E11.8 E11.65 Z79.4  1 each 0  . gabapentin (NEURONTIN) 300 MG capsule Take 1 capsule (300 mg total) by mouth at bedtime. 90 capsule 1  . glucose blood (ACCU-CHEK AVIVA) test strip Use as instructed to check blood sugar 2 times daily. E11.8 E11.65 Z79.4 100 each 12  . Insulin Pen Needle (B-D ULTRAFINE III SHORT PEN) 31G X 8 MM MISC 1 each by Does not apply route 3 (three) times daily. 100 each 5  . Insulin Syringe-Needle U-100 (TRUEPLUS INSULIN SYRINGE) 30G X 5/16" 0.5 ML MISC Use as directed 3 times daily 100 each 5  . liraglutide (VICTOZA) 18 MG/3ML SOPN Inject subcutaneously daily at breakfast 0.6 mL x1 week then 1.2  mL x 1 week then 1.8 thereafter 30 mL 3  . losartan (COZAAR) 100 MG tablet Take 1 tablet (100 mg total) by mouth daily. 30 tablet 6  . metoprolol tartrate (LOPRESSOR) 100 MG tablet Take 1 tablet (100 mg total) by mouth 2 (two) times daily. 180 tablet 1  . oxyCODONE-acetaminophen (PERCOCET) 5-325 MG tablet 1-2 tabs po bid prn pain 30 tablet 0  . sodium chloride (OCEAN) 0.65 % SOLN nasal spray Place 1 spray into both nostrils 2 (two) times daily as needed for congestion.    Marland Kitchen testosterone cypionate (DEPOTESTOSTERONE CYPIONATE) 200 MG/ML injection Inject 1 mL (200 mg total) into the muscle every 28 (twenty-eight) days. 10 mL 3  . tiZANidine (ZANAFLEX) 4 MG tablet Take 1 tablet (4 mg total) by mouth at bedtime. 90 tablet 1  . TRUEPLUS INSULIN SYRINGE 30G X 5/16" 0.5 ML MISC USE AS DIRECTED 3 TIMES DAILY 100 each 0  . HUMALOG 100 UNIT/ML injection INJECT 7 UNITS  (.07ML) INTO SKIN THREE TIMES A DAY BEFORE MEALS 10 mL 2  . Insulin Glargine (LANTUS SOLOSTAR) 100 UNIT/ML Solostar Pen Inject 54 Units into the skin 2 (two) times daily. 30 mL 6  . lidocaine (LIDODERM) 5 % PLACE 1 PATCH DAILY ONTO THE SKIN. REMOVE AND DISCARD PATCH WITHIN 12 HOURS OR AS DIRECTED BY MD. (Patient not taking: Reported on 09/21/2018) 30 patch 0   No facility-administered medications prior to visit.      ROS Review of Systems  Constitutional: Negative for activity change and appetite change.  HENT: Negative for sinus pressure and sore throat.   Eyes: Negative for visual disturbance.  Respiratory: Negative for cough, chest tightness and shortness of breath.   Cardiovascular: Negative for chest pain and leg swelling.  Gastrointestinal: Negative for abdominal distention, abdominal pain, constipation and diarrhea.  Endocrine: Negative.   Genitourinary: Negative for dysuria.  Musculoskeletal:       See HPI  Skin: Negative for rash.  Allergic/Immunologic: Negative.   Neurological: Negative for weakness, light-headedness and  numbness.  Psychiatric/Behavioral: Negative for dysphoric mood and suicidal ideas.    Objective:  BP 134/85   Pulse 85   Temp 98 F (36.7 C) (Oral)   Ht 6\' 3"  (1.905 m)   Wt (!) 336 lb (152.4 kg)   SpO2 97%   BMI 42.00 kg/m   BP/Weight 12/26/2018 09/21/2018 5/95/6387  Systolic BP 564 332 951  Diastolic BP 85 80 99  Wt. (Lbs) 336 343 340  BMI 42 42.87 42.5      Physical Exam Constitutional:      Appearance: He is well-developed. He is obese.  Neck:     Vascular: No JVD.  Cardiovascular:     Rate and Rhythm: Normal rate.     Heart sounds: Normal heart sounds. No murmur.  Pulmonary:     Effort: Pulmonary effort is normal.     Breath sounds: Normal breath sounds. No wheezing or rales.  Chest:     Chest wall: No tenderness.  Abdominal:     General: Bowel sounds are normal. There is no distension.     Palpations: Abdomen is soft. There is no mass.     Tenderness: There is no abdominal tenderness.  Musculoskeletal:        General: Tenderness (in L shoulder on ROM; decreaesd ROM) present.     Right lower leg: No edema.     Left lower leg: No edema.     Comments: Small vertical laceration on plantar aspect of right big toe with no evidence of infection.  Neurological:     Mental Status: He is alert and oriented to person, place, and time.  Psychiatric:        Mood and Affect: Mood normal.     CMP Latest Ref Rng & Units 09/21/2018 10/24/2017 04/15/2017  Glucose 65 - 99 mg/dL 164(H) 277(H) CANCELED  BUN 6 - 24 mg/dL 15 15 CANCELED  Creatinine 0.76 - 1.27 mg/dL 1.03 1.13 CANCELED  Sodium 134 - 144 mmol/L 137 134(L) CANCELED  Potassium 3.5 - 5.2 mmol/L 4.4 4.6 CANCELED  Chloride 96 - 106 mmol/L 103 101 CANCELED  CO2 20 - 29 mmol/L 24 25 CANCELED  Calcium 8.7 - 10.2 mg/dL 9.1 9.4 CANCELED  Total Protein 6.0 - 8.5 g/dL 6.7 - CANCELED  Total Bilirubin 0.0 - 1.2 mg/dL 0.3 - CANCELED  Alkaline Phos 39 - 117 IU/L 76 - CANCELED  AST 0 - 40 IU/L 12 - CANCELED  ALT 0 - 44 IU/L  18 - CANCELED    Lipid Panel     Component Value Date/Time   CHOL 157 04/04/2018 1103   TRIG 256 (H) 04/04/2018 1103   HDL 42 04/04/2018 1103   CHOLHDL 3.7 04/04/2018 1103   CHOLHDL 3.8 04/10/2010 0315   VLDL 32 04/10/2010 0315   LDLCALC 64 04/04/2018 1103    CBC    Component Value Date/Time   WBC 7.8 09/21/2018 1341   WBC 9.0 10/24/2017 1725   RBC 5.11 09/21/2018 1341   RBC 5.48 10/24/2017 1725   HGB 14.1 09/21/2018 1341   HCT 44.5 09/21/2018 1341   PLT 197 09/21/2018 1341   MCV 87 09/21/2018 1341   MCH 27.6 09/21/2018 1341   MCH 27.9 10/24/2017 1725   MCHC 31.7 09/21/2018 1341   MCHC 32.1 10/24/2017 1725   RDW 14.1 09/21/2018 1341   LYMPHSABS 1.7 09/21/2018 1341   MONOABS 792 05/14/2016 1641   EOSABS 0.4 09/21/2018 1341   BASOSABS 0.0 09/21/2018 1341    Lab Results  Component Value Date   HGBA1C 6.2 12/26/2018    Assessment & Plan:   1.  Type 2 diabetes mellitus with hypoglycemia without coma A1c has improved significantly to 6.2 which is down from 8.5 previously Due to hypoglycemic symptoms Lantus has been decreased to 50 units 2 times daily, NovoLog discontinued and he has been educated on management of hypoglycemia Advised to down titrate by 2 units twice daily if hypoglycemia occurs Discussed foot care Counseled on Diabetic diet, my plate method, 403 minutes of moderate intensity exercise/week Keep blood sugar logs with fasting goals of 80-120 mg/dl, random of less than 180 and in the event of sugars less than 60 mg/dl or greater than 400 mg/dl please notify the clinic ASAP. It is recommended that you undergo annual eye  exams and annual foot exams. Pneumonia vaccine is recommended. - POCT glucose (manual entry) - POCT glycosylated hemoglobin (Hb A1C) - Insulin Glargine (LANTUS SOLOSTAR) 100 UNIT/ML Solostar Pen; Inject 50 Units into the skin 2 (two) times daily.  Dispense: 30 mL; Refill: 6  2. Essential hypertension Controlled Counseled on blood  pressure goal of less than 130/80, low-sodium, DASH diet, medication compliance, 150 minutes of moderate intensity exercise per week. Discussed medication compliance, adverse effects.   3. Chronic left shoulder pain Uncontrolled He will be following up with his orthopedic regarding his shoulder surgery now that he has optimal glycemic control  4. Paroxysmal atrial fibrillation (H[facility]) In sinus rhythm Continue anticoagulation with Eliquis and rate control with beta-blocker  5. Need for immunization against influenza - Flu Vaccine QUAD 36+ mos IM   Health Care Maintenance: Referred for colonoscopy at next visit Meds ordered this encounter  Medications  . Insulin Glargine (LANTUS SOLOSTAR) 100 UNIT/ML Solostar Pen    Sig: Inject 50 Units into the skin 2 (two) times daily.    Dispense:  30 mL    Refill:  6    Follow-up: Return in about 3 months (around 03/27/2019) for medical conditions.       [name], MD, FAAFP. [facility] [place] [place], [place]   12/26/2018, 1:44 PM

## 2018-12-27 ENCOUNTER — Encounter: Payer: Self-pay | Admitting: Orthopaedic Surgery

## 2018-12-27 ENCOUNTER — Ambulatory Visit (INDEPENDENT_AMBULATORY_CARE_PROVIDER_SITE_OTHER): Payer: Medicaid Other | Admitting: Orthopaedic Surgery

## 2018-12-27 DIAGNOSIS — G8929 Other chronic pain: Secondary | ICD-10-CM | POA: Diagnosis not present

## 2018-12-27 DIAGNOSIS — M25512 Pain in left shoulder: Secondary | ICD-10-CM

## 2018-12-27 NOTE — Progress Notes (Signed)
Office Visit Note   Patient: Jeffery [name]           Date of Birth: [birth year]           MRN: [id number] Visit Date: 12/27/2018              Requested by: [name], MD [place],  [place] [address] PCP: [name], MD   Assessment & Plan: Visit Diagnoses:  1. Chronic left shoulder pain     Plan: Impression is left shoulder partial rotator cuff tear and AC arthropathy.  At this point, patient would like to proceed with surgical intervention to include a left shoulder arthroscopic debridement, acromioplasty, distal clavicle excision and possible rotator cuff repair.  He has failed all conservative treatment options at this point.  He is gotten his hemoglobin A1c to 6.2.  We will need cardiac clearance as he has A. fib and is on Eliquis.  Barbera Setters will give him a call to schedule the surgery.  Follow-Up Instructions: Return for post-op.   Orders:  No orders of the defined types were placed in this encounter.  No orders of the defined types were placed in this encounter.     Procedures: No procedures performed   Clinical Data: No additional findings.   Subjective: Chief Complaint  Patient presents with  . Left Shoulder - Pain    HPI patient is a pleasant 58 year old gentleman who presents our clinic today to discuss surgical intervention on the left shoulder.  History of partial rotator cuff tear and AC arthropathy.  He has tried and failed oral and injectable anti-inflammatories.  We have been waiting on his hemoglobin A1c to get below 8.0.  He states that yesterday it was drawn and was 6.2.  At this point, he is ready to proceed with surgical intervention.  Review of Systems as detailed in HPI.  All others reviewed and are negative.   Objective: Vital Signs: There were no vitals taken for this visit.  Physical Exam well-developed and well-nourished gentleman in no acute distress.  Alert and oriented x3.  Ortho Exam stable exam of the  left shoulder  Specialty Comments:  No specialty comments available.  Imaging: No new imaging   PMFS History: Patient Active Problem List   Diagnosis Date Noted  . Testosterone deficiency 03/16/2018  . Chronic left shoulder pain 03/01/2018  . Body mass index 40.0-44.9, adult ([facility]) 03/01/2018  . DDD (degenerative disc disease), lumbar 12/15/2017  . Chronic anticoagulation 10/26/2017  . Dyspnea 10/26/2017  . S/P right rotator cuff repair 04/05/2017  . Achilles tendon contracture, right 01/05/2017  . Closed nondisplaced fracture of distal phalanx of right great toe 01/05/2017  . Paroxysmal atrial fibrillation ([place]) 10/30/2016  . Pain in right ankle and joints of right foot 07/09/2016  . Diabetic polyneuropathy associated with type 2 diabetes mellitus ([place]) 07/09/2016  . 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 Impingement syndrome of right shoulder 07/07/2016  . Morbid obesity ([place]) 06/05/2016  . S/P TKR (total knee replacement), right 03/28/2015  . Knee osteoarthritis 11/12/2014  . Hypertension 10/15/2014  . Charcot foot due to diabetes mellitus ([place]) 10/01/2014  . Accelerated hypertension 10/01/2014  . Gangrene left third toe 10/01/2014   Past Medical History:  Diagnosis Date  . Atrial fibrillation ([place])   . Charcot's joint of right foot   . DDD (degenerative disc disease), lumbar   . Diabetes mellitus without complication ([place])   . Fatty liver   .  Hypertension   . Obesity   . Rotator cuff disorder   . Shoulder impingement, right     Family History  Problem Relation Age of Onset  . Diabetes Father   . Hypertension Father   . Heart failure Father     Past Surgical History:  Procedure Laterality Date  . AMPUTATION Left 10/02/2014   Procedure: Left Third toe amputation ;  Surgeon: [name], MD;  Location: [facility];  Service: Orthopedics;  Laterality: Left;  Regular bed, wants to follow hip  . ANTERIOR CRUCIATE  LIGAMENT REPAIR Right 90   reconstruction  . APPLICATION OF WOUND VAC Left 10/02/2014   Procedure: APPLICATION OF WOUND VAC; toe Surgeon: [name], MD;  Location: [facility];  Service: Orthopedics;  Laterality: Left;  . I&D EXTREMITY Left 10/05/2014   Procedure: IRRIGATION AND DEBRIDEMENT LEFT FOOT;  Surgeon: [name], MD;  Location: [facility];  Service: Orthopedics;  Laterality: Left;  . KNEE ARTHROSCOPY W/ ACL RECONSTRUCTION Right   . TOTAL KNEE ARTHROPLASTY Right 03/28/2015  . TOTAL KNEE ARTHROPLASTY Right 03/28/2015   Procedure: RIGHT TOTAL KNEE ARTHROPLASTY;  Surgeon: [name], MD;  Location: [facility];  Service: Orthopedics;  Laterality: Right;   Social History   Occupational History  . Occupation: Management consultant  Tobacco Use  . Smoking status: Former Smoker    Packs/day: 0.00    Years: 38.00    Pack years: 0.00    Types: Cigarettes  . Smokeless tobacco: Never Used  Substance and Sexual Activity  . Alcohol use: No    Alcohol/week: 5.0 - 6.0 standard drinks    Types: 5 - 6 Cans of beer per week  . Drug use: Yes    Types: Marijuana    Comment: last week ; denies 03/24/17  . Sexual activity: Not on file

## 2019-01-05 DIAGNOSIS — M542 Cervicalgia: Secondary | ICD-10-CM | POA: Diagnosis not present

## 2019-01-05 DIAGNOSIS — M545 Low back pain: Secondary | ICD-10-CM | POA: Diagnosis not present

## 2019-01-05 DIAGNOSIS — M25571 Pain in right ankle and joints of right foot: Secondary | ICD-10-CM | POA: Diagnosis not present

## 2019-01-05 DIAGNOSIS — M5412 Radiculopathy, cervical region: Secondary | ICD-10-CM | POA: Diagnosis not present

## 2019-01-16 ENCOUNTER — Telehealth: Payer: Self-pay | Admitting: *Deleted

## 2019-01-16 NOTE — Telephone Encounter (Signed)
Patient with diagnosis of afib on Eliquis for anticoagulation.    Procedure: LEFT SHOULDER ARTHROSCOPY /RC REPAIR Date of procedure: TBD  CHADS2-VASc score of  2 (HTN, DM2)  CrCl 126 ml/min  Per office protocol, patient can hold Eliquis for 2 days prior to procedure.

## 2019-01-16 NOTE — Telephone Encounter (Signed)
PRIMARY CARDIOLOGIST  [facility] Pre-operative Risk Assessment    Request for surgical clearance:  1. What type of surgery is being performed? LEFT SHOULDER ARTHROSCOPY /RC REPAIR  2. When is this surgery scheduled? TBD  3. What type of clearance is required (medical clearance vs. Pharmacy clearance to hold med vs. Both)? BOTH  Are there any medications that need to be held prior to surgery and how long? ELIQUIS   4. Practice name and name of physician performing surgery? [facility] [facility]  5. What is your office phone number [phone]   7.   What is your office fax number [phone] [place]   8.   Anesthesia type (None, local, MAC, general) ? GENERAL+ BLOCK   Raiford Simmonds 01/16/2019, 1:56 PM  _________________________________________________________________   (provider comments below)

## 2019-01-16 NOTE — Telephone Encounter (Signed)
   Primary Cardiologist:Thomas Claiborne Billings, MD (Last seen 12/2017)  Chart reviewed as part of pre-operative protocol coverage. Because of Jeffery [name] past medical history and time since last visit, he/she will require a follow-up visit in order to better assess preoperative cardiovascular risk.  Pre-op covering staff: - Please schedule appointment and call patient to inform them. - Please contact requesting surgeon's office via preferred method (i.e, phone, fax) to inform them of need for appointment prior to surgery.   [place], [place]  01/16/2019, 2:48 PM

## 2019-01-16 NOTE — Telephone Encounter (Signed)
Pt has been scheduled to see Dr. Claiborne Billings 02/13/2019  Surgical clearance will be addressed at that time. Will fax back to the requesting surgeon's office to make them aware.

## 2019-02-06 ENCOUNTER — Other Ambulatory Visit: Payer: Self-pay | Admitting: Family Medicine

## 2019-02-07 ENCOUNTER — Telehealth: Payer: Self-pay | Admitting: Family Medicine

## 2019-02-07 NOTE — Telephone Encounter (Signed)
Patient wanted to talk about his insulin

## 2019-02-08 NOTE — Telephone Encounter (Signed)
Will route to PCP for review. 

## 2019-02-08 NOTE — Telephone Encounter (Signed)
Patient called request to speak with his nurse regarding his up coming surgery. Patient states that he is stating victoza and he would like to be prescribed humalog for a couple of weeks after his surgery because the victoza makes his nauseas. Please f/u

## 2019-02-09 MED ORDER — HUMALOG JUNIOR KWIKPEN 100 UNIT/ML ~~LOC~~ SOPN: 8.0000 [IU] | PEN_INJECTOR | Freq: Three times a day (TID) | SUBCUTANEOUS | 1 refills | Status: DC

## 2019-02-09 NOTE — Telephone Encounter (Signed)
Prescription for Humalog 8 units 3 times daily has been sent to his pharmacy which he can use after his surgery.  He will need to monitor his blood sugars and if they are running more he can decrease by 2 units

## 2019-02-10 DIAGNOSIS — M5412 Radiculopathy, cervical region: Secondary | ICD-10-CM | POA: Diagnosis not present

## 2019-02-10 DIAGNOSIS — M542 Cervicalgia: Secondary | ICD-10-CM | POA: Diagnosis not present

## 2019-02-10 DIAGNOSIS — M25571 Pain in right ankle and joints of right foot: Secondary | ICD-10-CM | POA: Diagnosis not present

## 2019-02-10 DIAGNOSIS — M545 Low back pain: Secondary | ICD-10-CM | POA: Diagnosis not present

## 2019-02-13 ENCOUNTER — Ambulatory Visit: Payer: Medicaid Other | Admitting: Cardiovascular Disease

## 2019-02-13 ENCOUNTER — Other Ambulatory Visit: Payer: Self-pay

## 2019-02-13 ENCOUNTER — Encounter: Payer: Self-pay | Admitting: Cardiovascular Disease

## 2019-02-13 VITALS — BP 126/85 | HR 86 | Temp 97.3°F | Ht 74.0 in | Wt 337.8 lb

## 2019-02-13 DIAGNOSIS — E118 Type 2 diabetes mellitus with unspecified complications: Secondary | ICD-10-CM | POA: Diagnosis not present

## 2019-02-13 DIAGNOSIS — Z794 Long term (current) use of insulin: Secondary | ICD-10-CM | POA: Diagnosis not present

## 2019-02-13 DIAGNOSIS — I480 Paroxysmal atrial fibrillation: Secondary | ICD-10-CM | POA: Diagnosis not present

## 2019-02-13 DIAGNOSIS — R0602 Shortness of breath: Secondary | ICD-10-CM

## 2019-02-13 DIAGNOSIS — Z7901 Long term (current) use of anticoagulants: Secondary | ICD-10-CM

## 2019-02-13 DIAGNOSIS — Z79899 Other long term (current) drug therapy: Secondary | ICD-10-CM | POA: Diagnosis not present

## 2019-02-13 DIAGNOSIS — I4819 Other persistent atrial fibrillation: Secondary | ICD-10-CM

## 2019-02-13 DIAGNOSIS — E785 Hyperlipidemia, unspecified: Secondary | ICD-10-CM | POA: Diagnosis not present

## 2019-02-13 DIAGNOSIS — I10 Essential (primary) hypertension: Secondary | ICD-10-CM

## 2019-02-13 MED ORDER — AMIODARONE HCL 200 MG PO TABS: 200.0000 mg | ORAL_TABLET | Freq: Two times a day (BID) | ORAL | 3 refills | Status: DC

## 2019-02-13 NOTE — Telephone Encounter (Signed)
Patient was called and informed of medication change  

## 2019-02-13 NOTE — Progress Notes (Signed)
Cardiology Office Note    Date:  02/15/2019   ID:  Jeffery [name], DOB [birth year], MRN [id number]  PCP:  Jeffery Rakes, MD  Cardiologist:  Jeffery Majestic, MD   No chief complaint on file.   History of Present Illness:  Jeffery [name] is a 58 y.o. male who is the son of my former patient Jeffery [name].    He established cardiology care with me in December 2018.  I last saw him in October 2019.  He presents for a 13 month follow-up evaluation prior to undergoing rotator cuff surgery.  Jeffery [name] has a history of obesity, diabetes mellitus, and hypertension.  He previously had a significant EtOH history and was drinking up to 4 cases of beer per week.  In May after having significant amount of beer the night before he woke up in the morning feeling weak.  He presented and was found to be in atrial fibrillation of new onset.  He was placed on Lopressor and eliquis and underwent successful cardioversion.  His cha2ds2vasc score was 2.  He was seen in A. fib clinic in follow-up on 08/27/2016 and was in sinus rhythm with PACs.  An echo Doppler study showed an EF of 50-55% with grade 1 diastolic dysfunction.  His left atrium was moderately dilated.  There was trivial MR.  He had done well maintaining sinus rhythm until August when again he had another alcohol binge and was back in atrial fibrillation.  He had been having difficulty with his back and had been off eliquis a week earlier to allow for treatment with ibuprofen.  He again was cardioverted for his A. fib with a rate of 169 on presentation and eliquis was resumed.  The patient has not had any alcohol since that presentation and quit smoking.    When I saw him in December 2018 he was maintaining sinus rhythm. He was having significant difficulty with his back. He has a Charcot foot and has difficulty walking. He denied any chest tightness or chest pressure.  He was unable to exercise due to his back discomfort. He had run out of his  metoprolol as pulses in the 90s with PACs.. I further titrated metoprolol to 75 mg twice a day, and he has continued to be on losartan 50 mg daily for HTN.    I saw him in March 2019 at which time he had noticed occasional palpitations.  He was unable to exercise due to his back and foot.  He quit tobacco and alcohol on November 01, 2016.  He saw Jeffery [name] following an ER evaluation  with complaints of dyspnea at rest.  He thought he had gone back into atrial fibrillation but in the emergency room he was in normal sinus rhythm.  Troponins were negative.  A close friend had recently had CABG revascularization.  He subsequently was referred for an nuclear perfusion study which was normal on November 11, 2017.  An echo Doppler study revealed normal EF at 55 to 60% with grade 1 diastolic dysfunction.  Peak PA pressure was minimally increased at 33 mm.  He has chronic pain syndrome and apparently the pain clinic prescribes Percocet.  He lives with his mother.  He is followed at [facility] clinic by Jeffery [name].  I last saw him in October 2019 at which time he was remaining stable.  He continues to be morbidly obese and we discussed the importance of weight loss.  We also discussed evaluating him for  sleep apnea.  He was unaware of any recurrent episodes of atrial fibrillation.  We discussed the importance of exercise and followed up laboratory.  Over the last several weeks he has noticed increasing shortness of breath particularly with walking up steps.  He was unaware of any change in his rhythm.  He has not had any EtOH since November 01, 2016.  He also quit tobacco.  Over the past several months he is tried hard to improve his diet with reference to his diabetes and his hemoglobin A1c had reduced from 8.5 to 6.2.  He has had difficulty with his shoulder and is tentatively scheduled to undergo rotator cuff surgery next week.  He presents to the office today for evaluation.  Past Medical History:   Diagnosis Date  . Atrial fibrillation ([place])   . Charcot's joint of right foot   . DDD (degenerative disc disease), lumbar   . Diabetes mellitus without complication ([place])   . Fatty liver   . Hypertension   . Obesity   . Rotator cuff disorder   . Shoulder impingement, right     Past Surgical History:  Procedure Laterality Date  . AMPUTATION Left 10/02/2014   Procedure: Left Third toe amputation ;  Surgeon: Jeffery Koyanagi, MD;  Location: [place];  Service: Orthopedics;  Laterality: Left;  Regular bed, wants to follow hip  . ANTERIOR CRUCIATE LIGAMENT REPAIR Right 90   reconstruction  . APPLICATION OF WOUND VAC Left 10/02/2014   Procedure: APPLICATION OF WOUND VAC; toe Surgeon: Jeffery Koyanagi, MD;  Location: [place];  Service: Orthopedics;  Laterality: Left;  . I&D EXTREMITY Left 10/05/2014   Procedure: IRRIGATION AND DEBRIDEMENT LEFT FOOT;  Surgeon: Jeffery Koyanagi, MD;  Location: [place];  Service: Orthopedics;  Laterality: Left;  . KNEE ARTHROSCOPY W/ ACL RECONSTRUCTION Right   . TOTAL KNEE ARTHROPLASTY Right 03/28/2015  . TOTAL KNEE ARTHROPLASTY Right 03/28/2015   Procedure: RIGHT TOTAL KNEE ARTHROPLASTY;  Surgeon: Jeffery Koyanagi, MD;  Location: [place];  Service: Orthopedics;  Laterality: Right;    Current Medications: Outpatient Medications Prior to Visit  Medication Sig Dispense Refill  . Accu-Chek FastClix Lancets MISC Use as directed to check blood sugar at least twice daily. E11.8 E11.65 Z79.4 102 each 11  . apixaban (ELIQUIS) 5 MG TABS tablet Take 1 tablet (5 mg total) by mouth 2 (two) times daily. 60 tablet 6  . atorvastatin (LIPITOR) 20 MG tablet Take 1 tablet (20 mg total) by mouth daily. 90 tablet 1  . Blood Glucose Monitoring Suppl (ACCU-CHEK AVIVA) device Use as instructed to check blood sugar 2 times daily. E11.8 E11.65 Z79.4 1 each 0  . docusate sodium (COLACE) 100 MG capsule Take 100 mg by mouth daily as needed for mild constipation.    . gabapentin (NEURONTIN) 300 MG capsule Take 1  capsule (300 mg total) by mouth at bedtime. (Patient taking differently: Take 300 mg by mouth at bedtime as needed (pain). ) 90 capsule 1  . glucose blood (ACCU-CHEK AVIVA) test strip Use as instructed to check blood sugar 2 times daily. E11.8 E11.65 Z79.4 100 each 12  . Insulin Glargine (LANTUS SOLOSTAR) 100 UNIT/ML Solostar Pen Inject 50 Units into the skin 2 (two) times daily. 30 mL 6  . insulin lispro (INSULIN LISPRO) 100 UNIT/ML KwikPen Junior Inject 0.08 mLs (8 Units total) into the skin 3 (three) times daily. 30 mL 1  . Insulin Pen Needle (B-D ULTRAFINE III SHORT PEN) 31G X 8 MM  MISC 1 each by Does not apply route 3 (three) times daily. 100 each 5  . Insulin Syringe-Needle U-100 (TRUEPLUS INSULIN SYRINGE) 30G X 5/16" 0.5 ML MISC Use as directed 3 times daily 100 each 5  . lidocaine (LIDODERM) 5 % PLACE 1 PATCH DAILY ONTO THE SKIN. REMOVE AND DISCARD PATCH WITHIN 12 HOURS OR AS DIRECTED BY MD. 30 patch 0  . liraglutide (VICTOZA) 18 MG/3ML SOPN Inject subcutaneously daily at breakfast 0.6 mL x1 week then 1.2 mL x 1 week then 1.8 thereafter (Patient taking differently: Inject 1.8 mg into the skin daily. ) 30 mL 3  . losartan (COZAAR) 100 MG tablet Take 1 tablet (100 mg total) by mouth daily. 30 tablet 6  . losartan (COZAAR) 50 MG tablet Take 50 mg by mouth daily.    . metoprolol tartrate (LOPRESSOR) 100 MG tablet Take 1 tablet (100 mg total) by mouth 2 (two) times daily. 180 tablet 1  . oxyCODONE-acetaminophen (PERCOCET) 5-325 MG tablet 1-2 tabs po bid prn pain (Patient taking differently: Take 1 tablet by mouth every 4 (four) hours as needed for moderate pain. ) 30 tablet 0  . sodium chloride (OCEAN) 0.65 % SOLN nasal spray Place 1 spray into both nostrils 2 (two) times daily as needed for congestion.    Marland Kitchen testosterone cypionate (DEPOTESTOSTERONE CYPIONATE) 200 MG/ML injection Inject 1 mL (200 mg total) into the muscle every 28 (twenty-eight) days. 10 mL 3  . tiZANidine (ZANAFLEX) 4 MG tablet Take  1 tablet (4 mg total) by mouth at bedtime. 90 tablet 1  . TRUEPLUS INSULIN SYRINGE 30G X 5/16" 0.5 ML MISC USE AS DIRECTED 3 TIMES DAILY 100 each 0   No facility-administered medications prior to visit.      Allergies:   Metformin and related   Social History   Socioeconomic History  . Marital status: Single    Spouse name: Not on file  . Number of children: Not on file  . Years of education: Not on file  . Highest education level: Not on file  Occupational History  . Occupation: Management consultant  Social Needs  . Financial resource strain: Not on file  . Food insecurity    Worry: Not on file    Inability: Not on file  . Transportation needs    Medical: Not on file    Non-medical: Not on file  Tobacco Use  . Smoking status: Former Smoker    Packs/day: 0.00    Years: 38.00    Pack years: 0.00    Types: Cigarettes  . Smokeless tobacco: Never Used  Substance and Sexual Activity  . Alcohol use: No    Alcohol/week: 5.0 - 6.0 standard drinks    Types: 5 - 6 Cans of beer per week  . Drug use: Yes    Types: Marijuana    Comment: last week ; denies 03/24/17  . Sexual activity: Not on file  Lifestyle  . Physical activity    Days per week: Not on file    Minutes per session: Not on file  . Stress: Not on file  Relationships  . Social Herbalist on phone: Not on file    Gets together: Not on file    Attends religious service: Not on file    Active member of club or organization: Not on file    Attends meetings of clubs or organizations: Not on file    Relationship status: Not on file  Other Topics Concern  .  Not on file  Social History Narrative   Lives alone.    Socially, never married.  He has no children.  He is currently living with his mother.  He is not working.  Previously had done jobs.  Family History:  The patient's family history includes Diabetes in his father; Heart failure in his father; Hypertension in his father.  His father died  at age 47.  Mother is still living at age 23.  ROS General: Negative; No fevers, chills, or night sweats;  HEENT: Negative; No changes in vision or hearing, sinus congestion, difficulty swallowing Pulmonary: Negative; No cough, wheezing, shortness of breath, hemoptysis Cardiovascular: Negative; No chest pain, presyncope, syncope, palpitations GI: Negative; No nausea, vomiting, diarrhea, or abdominal pain GU: Negative; No dysuria, hematuria, or difficulty voiding Musculoskeletal: Chronic pain due to spinal stenosis and Charcot foot Hematologic/Oncology: Negative; no easy bruising, bleeding Endocrine: Negative; no heat/cold intolerance; no diabetes Neuro: Negative; no changes in balance, headaches Skin: Negative; No rashes or skin lesions Psychiatric: Negative; No behavioral problems, depression Sleep: Positive for snoring, daytime sleepiness, hypersomnolence, bruxism, restless legs, hypnogognic hallucinations, no cataplexy Other comprehensive 14 point system review is negative.   PHYSICAL EXAM:   BP 126/85   Pulse 86   Temp (!) 97.3 F (36.3 C)   Ht 6' 2"  (1.88 m)   Wt (!) 337 lb 12.8 oz (153.2 kg)   SpO2 97%   BMI 43.37 kg/m    Repeat blood pressure by me was 124/84.  Wt Readings from Last 3 Encounters:  02/13/19 (!) 337 lb 12.8 oz (153.2 kg)  12/26/18 (!) 336 lb (152.4 kg)  09/21/18 (!) 343 lb (155.6 kg)    General: Alert, oriented, no distress.  Morbidly obese Skin: normal turgor, no rashes, warm and dry HEENT: Normocephalic, atraumatic. Pupils equal round and reactive to light; sclera anicteric; extraocular muscles intact; Nose without nasal septal hypertrophy Mouth/Parynx benign; Mallinpatti scale 3/4 Neck: No JVD, no carotid bruits; normal carotid upstroke Lungs: clear to ausculatation and percussion; no wheezing or rales Chest wall: without tenderness to palpitation Heart: PMI not displaced, RRR, s1 s2 normal, 1/6 systolic murmur, no diastolic murmur, no rubs,  gallops, thrills, or heaves Abdomen: soft, nontender; no hepatosplenomehaly, BS+; abdominal aorta nontender and not dilated by palpation. Back: no CVA tenderness Pulses 2+ Musculoskeletal: full range of motion, normal strength, no joint deformities Extremities: no clubbing cyanosis or edema, Homan's sign negative  Neurologic: grossly nonfocal; Cranial nerves grossly wnl Psychologic: Normal mood and affect   Studies/Labs Reviewed:   EKG:  EKG is ordered today.  ECG (independently read by me): Atrial fibrillation at 86 bpm; QTc 440 msec  October 2019 ECG (independently read by me): Sinus rhythm at 71 bpm.  No ectopy.  Normal intervals.  March 2019 ECG (independently read by me): normal sinus rhythm at 88 bpm. QTc interval 433 ms.  December 2018 ECG (independently read by me): Sinus rhythm in the 90s with PACs.  QTc interval 442 ms.  Recent Labs: BMP Latest Ref Rng & Units 02/13/2019 09/21/2018 10/24/2017  Glucose 65 - 99 mg/dL 167(H) 164(H) 277(H)  BUN 6 - 24 mg/dL 18 15 15   Creatinine 0.76 - 1.27 mg/dL 0.96 1.03 1.13  BUN/Creat Ratio 9 - 20 19 15  -  Sodium 134 - 144 mmol/L 138 137 134(L)  Potassium 3.5 - 5.2 mmol/L 5.0 4.4 4.6  Chloride 96 - 106 mmol/L 100 103 101  CO2 20 - 29 mmol/L 21 24 25   Calcium 8.7 - 10.2  mg/dL 9.7 9.1 9.4     Hepatic Function Latest Ref Rng & Units 02/13/2019 09/21/2018 04/15/2017  Total Protein 6.0 - 8.5 g/dL 7.6 6.7 CANCELED  Albumin 3.8 - 4.9 g/dL 4.5 4.1 CANCELED  AST 0 - 40 IU/L 19 12 CANCELED  ALT 0 - 44 IU/L 25 18 CANCELED  Alk Phosphatase 39 - 117 IU/L 102 76 CANCELED  Total Bilirubin 0.0 - 1.2 mg/dL 0.7 0.3 CANCELED    CBC Latest Ref Rng & Units 02/13/2019 09/21/2018 06/15/2018  WBC 3.4 - 10.8 x10E3/uL 11.8(H) 7.8 10.8  Hemoglobin 13.0 - 17.7 g/dL 16.1 14.1 14.9  Hematocrit 37.5 - 51.0 % 48.8 44.5 45.4  Platelets 150 - 450 x10E3/uL 250 197 247   Lab Results  Component Value Date   MCV 83 02/13/2019   MCV 87 09/21/2018   MCV 85 06/15/2018    Lab Results  Component Value Date   TSH 2.490 02/13/2019   Lab Results  Component Value Date   HGBA1C 6.2 12/26/2018     BNP No results found for: BNP  ProBNP No results found for: PROBNP   Lipid Panel     Component Value Date/Time   CHOL 157 04/04/2018 1103   TRIG 256 (H) 04/04/2018 1103   HDL 42 04/04/2018 1103   CHOLHDL 3.7 04/04/2018 1103   CHOLHDL 3.8 04/10/2010 0315   VLDL 32 04/10/2010 0315   LDLCALC 64 04/04/2018 1103     RADIOLOGY: No results found.   Additional studies/ records that were reviewed today include:  I reviewed the patient's atrial fibrillation clinic records as well as his ER visits.  I reviewed his most recent echo Doppler study and nuclear perfusion studies of August 2019  ------------------------------------------------------------------- 08/18/2016 ECHO Study Conclusions  - Left ventricle: The cavity size was mildly dilated. Wall   thickness was increased in a pattern of mild LVH. Systolic   function was normal. The estimated ejection fraction was in the   range of 50% to 55%. Wall motion was normal; there were no   regional wall motion abnormalities. Doppler parameters are   consistent with abnormal left ventricular relaxation (grade 1   diastolic dysfunction). - Aortic valve: Transvalvular velocity was within the normal range.   There was no stenosis. There was no regurgitation. - Mitral valve: Transvalvular velocity was within the normal range.   There was no evidence for stenosis. There was trivial   regurgitation. - Left atrium: The atrium was moderately dilated. - Right ventricle: The cavity size was normal. Wall thickness was   normal. Systolic function was normal. - Tricuspid valve: There was no regurgitation.  ------------------------------------------------------------------- August 2019  [facility] Conclusions  - Left ventricle: The cavity size was moderately dilated. Wall   thickness was normal. Systolic function  was normal. The estimated   ejection fraction was in the range of 55% to 60%. Wall motion was   normal; there were no regional wall motion abnormalities. Doppler   parameters are consistent with abnormal left ventricular   relaxation (grade 1 diastolic dysfunction). - Right ventricle: The cavity size was moderately dilated. Wall   thickness was normal. - Pulmonary arteries: Systolic pressure was mildly increased. PA   peak pressure: 33 mm Hg (S).   Gated SPECT myocardial perfusion study Highlights    Normal perfusion No ischemia or scar  LVEF is calculated at 48% Visual estimate suggests LVEF is greater Consider echo to confirm  The study is normal.  There was no ST segment deviation noted during stress.  ASSESSMENT:    1. Persistent atrial fibrillation ([place])   2. Medication management   3. Essential hypertension   4. Morbid obesity ([place])   5. Hyperlipidemia with target LDL less than 70   6. SOB (shortness of breath)   7. Anticoagulation adequate   8. Type 2 diabetes mellitus with complication, with long-term current use of insulin [facility])     PLAN:  Jeffery [name] is a 58 year old gentleman who has a long-standing history of obesity, and has been on diabetic medication since 2016.  In addition, has a history of hypertension and hyperlipidemia.  He previously had significant EtOH history and his 2 episodes of atrial fibrillation in 2018 occurred after significant alcohol binging.  He is not had any alcohol since his last cardioversion in August 2018 and also at that time quit tobacco.  His last echo Doppler study in August 2019 continue to show normal systolic function with EF at 55 to 60%.  There was evidence for grade 1 diastolic dysfunction.  There was mild pulmonary hypertension with a PA systolic pressure 33 mm.  He has had significant improvement with his diabetes with recent hemoglobin A1c being reduced from 8.5 down to 6.2.  He has been on losartan 50 mg,  metoprolol 100 mg twice a day for blood pressure control.  His ECG today reveals that he is in atrial fibrillation.  He was completely unaware of this.  In retrospect he now believes that he has noticed more dyspnea on exertion over the past 2 weeks with suggest that this may have been been going on for several weeks duration.  As result of him being in atrial fibrillation, I do not recommend that he stop Eliquis for his planned surgery next week.  I have recommended he defer his left shoulder surgery.  I am initiating amiodarone 200 mg twice a day.  I will schedule him for a follow-up echo Doppler study.  Repeat laboratory consisting of comprehensive metabolic panel, magnesium level, TSH, CBC will be obtained.  I will see he continues to be on insulin and Victoza for his diabetes.  He is on atorvastatin 20 mg for hyperlipidemia.  He is on supplemental testosterone monthly.  I will see him in several weeks for follow-up evaluation and repeat ECG.  Hopefully he will have pharmacologically cardioverted.  He continues to be on Eliquis for anticoagulation without bleeding.   Medication Adjustments/Labs and Tests Ordered: Current medicines are reviewed at length with the patient today.  Concerns regarding medicines are outlined above.  Medication changes, Labs and Tests ordered today are listed in the Patient Instructions below. Patient Instructions  Medication Instructions:  START AMIODARONE 200MG TWICE DAILY If you need a refill on your cardiac medications before your next appointment, please call your pharmacy.  Labwork: CMET,MAG, TSH AND CBC HERE IN OUR OFFICE AT [facility]    If you have labs (blood work) drawn today and your tests are completely normal, you will receive your results only by: Marland Kitchen MyChart Message (if you have MyChart) OR . A paper copy in the mail If you have any lab test that is abnormal or we need to change your treatment, we will call you to review the results.  Testing/Procedures:  Echocardiogram - Your physician has requested that you have an echocardiogram. Echocardiography is a painless test that uses sound waves to create images of your heart. It provides your doctor with information about the size and shape of your heart and how well your heart's chambers and  valves are working. This procedure takes approximately one hour. There are no restrictions for this procedure. This will be performed at our [facility] location - [address], [address].  Follow-Up: IN 2 WEEKS AFTER ECHO In Person You may see Jeffery Majestic, MD or one of the following Advanced Practice Providers on your designated Care Team:  Almyra Deforest, PA-C Fabian Sharp, PA-C or Roby Lofts, [place].    At [facility], you and your health needs are our priority.  As part of our continuing mission to provide you with exceptional heart care, we have created designated Provider Care Teams.  These Care Teams include your primary Cardiologist (physician) and Advanced Practice Providers (APPs -  Physician Assistants and Nurse Practitioners) who all work together to provide you with the care you need, when you need it.  Thank you for choosing [facility] at [facility]!!          Signed, Jeffery Majestic, MD  02/15/2019 6:28 PM    [place] [address] [place], [place], [place]  [address] Phone: [phone]

## 2019-02-13 NOTE — Patient Instructions (Signed)
Medication Instructions:  START AMIODARONE 200MG  TWICE DAILY If you need a refill on your cardiac medications before your next appointment, please call your pharmacy.  Labwork: CMET,MAG, TSH AND CBC HERE IN OUR OFFICE AT [facility]    If you have labs (blood work) drawn today and your tests are completely normal, you will receive your results only by: Marland Kitchen MyChart Message (if you have MyChart) OR . A paper copy in the mail If you have any lab test that is abnormal or we need to change your treatment, we will call you to review the results.  Testing/Procedures: Echocardiogram - Your physician has requested that you have an echocardiogram. Echocardiography is a painless test that uses sound waves to create images of your heart. It provides your doctor with information about the size and shape of your heart and how well your heart's chambers and valves are working. This procedure takes approximately one hour. There are no restrictions for this procedure. This will be performed at our [facility] location - [address].  Follow-Up: IN 2 WEEKS AFTER ECHO In Person You may see Shelva Majestic, MD or one of the following Advanced Practice Providers on your designated Care Team:  Almyra Deforest, PA-C Fabian Sharp, PA-C or Roby Lofts, [place].    At [facility], you and your health needs are our priority.  As part of our continuing mission to provide you with exceptional heart care, we have created designated Provider Care Teams.  These Care Teams include your primary Cardiologist (physician) and Advanced Practice Providers (APPs -  Physician Assistants and Nurse Practitioners) who all work together to provide you with the care you need, when you need it.  Thank you for choosing [facility] at [facility]!!

## 2019-02-14 LAB — CBC
Hematocrit: 48.8 % (ref 37.5–51.0)
Hemoglobin: 16.1 g/dL (ref 13.0–17.7)
MCH: 27.4 pg (ref 26.6–33.0)
MCHC: 33 g/dL (ref 31.5–35.7)
MCV: 83 fL (ref 79–97)
Platelets: 250 10*3/uL (ref 150–450)
RBC: 5.87 x10E6/uL — ABNORMAL HIGH (ref 4.14–5.80)
RDW: 13.5 % (ref 11.6–15.4)
WBC: 11.8 10*3/uL — ABNORMAL HIGH (ref 3.4–10.8)

## 2019-02-14 LAB — COMPREHENSIVE METABOLIC PANEL
ALT: 25 IU/L (ref 0–44)
AST: 19 IU/L (ref 0–40)
Albumin/Globulin Ratio: 1.5 (ref 1.2–2.2)
Albumin: 4.5 g/dL (ref 3.8–4.9)
Alkaline Phosphatase: 102 IU/L (ref 39–117)
BUN/Creatinine Ratio: 19 (ref 9–20)
BUN: 18 mg/dL (ref 6–24)
Bilirubin Total: 0.7 mg/dL (ref 0.0–1.2)
CO2: 21 mmol/L (ref 20–29)
Calcium: 9.7 mg/dL (ref 8.7–10.2)
Chloride: 100 mmol/L (ref 96–106)
Creatinine, Ser: 0.96 mg/dL (ref 0.76–1.27)
GFR calc Af Amer: 102 mL/min/{1.73_m2} (ref >59)
GFR calc non Af Amer: 88 mL/min/{1.73_m2} (ref >59)
Globulin, Total: 3.1 g/dL (ref 1.5–4.5)
Glucose: 167 mg/dL — ABNORMAL HIGH (ref 65–99)
Potassium: 5 mmol/L (ref 3.5–5.2)
Sodium: 138 mmol/L (ref 134–144)
Total Protein: 7.6 g/dL (ref 6.0–8.5)

## 2019-02-14 LAB — TSH: TSH: 2.49 u[IU]/mL (ref 0.450–4.500)

## 2019-02-14 LAB — MAGNESIUM: Magnesium: 2.6 mg/dL — ABNORMAL HIGH (ref 1.6–2.3)

## 2019-02-15 ENCOUNTER — Encounter: Payer: Self-pay | Admitting: Cardiovascular Disease

## 2019-02-16 ENCOUNTER — Other Ambulatory Visit (HOSPITAL_COMMUNITY): Payer: Medicaid Other

## 2019-02-16 ENCOUNTER — Inpatient Hospital Stay (HOSPITAL_COMMUNITY): Admission: RE | Admit: 2019-02-16 | Payer: Medicaid Other | Source: Ambulatory Visit

## 2019-02-17 ENCOUNTER — Telehealth: Payer: Self-pay | Admitting: Cardiovascular Disease

## 2019-02-17 NOTE — Telephone Encounter (Signed)
Sent to MD as FYI

## 2019-02-17 NOTE — Telephone Encounter (Signed)
Patient states he spoke with the pharmacist and he has decided to stop taking his muscle relaxer and will take the full dosage of his amiodarone (PACERONE) 200 MG tablet

## 2019-02-20 ENCOUNTER — Ambulatory Visit: Admit: 2019-02-20 | Payer: Medicaid Other | Admitting: Orthopaedic Surgery

## 2019-02-20 SURGERY — SHOULDER ARTHROSCOPY WITH ROTATOR CUFF REPAIR AND SUBACROMIAL DECOMPRESSION
Anesthesia: General | Site: Shoulder | Laterality: Left

## 2019-02-20 NOTE — Telephone Encounter (Signed)
ok 

## 2019-02-28 ENCOUNTER — Telehealth: Payer: Self-pay | Admitting: Cardiovascular Disease

## 2019-02-28 ENCOUNTER — Other Ambulatory Visit: Payer: Self-pay

## 2019-02-28 ENCOUNTER — Ambulatory Visit (HOSPITAL_COMMUNITY): Payer: Medicaid Other | Attending: Cardiovascular Disease

## 2019-02-28 DIAGNOSIS — Z79899 Other long term (current) drug therapy: Secondary | ICD-10-CM

## 2019-02-28 DIAGNOSIS — I4819 Other persistent atrial fibrillation: Secondary | ICD-10-CM | POA: Diagnosis not present

## 2019-02-28 NOTE — Telephone Encounter (Signed)
Follow Up:   Pt had Echo today. He wants to know how long before he gets his results. He is very concerned because he is Afib. He wonder if he should go ahead and have procedure to get his heart out of Afib

## 2019-02-28 NOTE — Telephone Encounter (Signed)
Pt calling to inform MD that he feels medication is no longer working for his afib symptoms. He report he doesn't feel like his heart is racing but just feels very winded when he climb stairs in his home. Pt inquiring if MD would like for him to proceed with cardioversion.    Will route to MD for recommendations.

## 2019-03-07 ENCOUNTER — Telehealth: Payer: Self-pay | Admitting: Cardiovascular Disease

## 2019-03-07 NOTE — Telephone Encounter (Signed)
New message    Patient calling for echo results

## 2019-03-07 NOTE — Telephone Encounter (Signed)
Follow up ° ° ° ° °Please return call to patient °

## 2019-03-07 NOTE — Telephone Encounter (Signed)
Pt wanting echo results appears Dr Claiborne Billings has not reviewed at this time ./cy

## 2019-03-07 NOTE — Telephone Encounter (Signed)
Lm to call back ./cy 

## 2019-03-10 DIAGNOSIS — M5412 Radiculopathy, cervical region: Secondary | ICD-10-CM | POA: Diagnosis not present

## 2019-03-10 DIAGNOSIS — M542 Cervicalgia: Secondary | ICD-10-CM | POA: Diagnosis not present

## 2019-03-10 DIAGNOSIS — M25571 Pain in right ankle and joints of right foot: Secondary | ICD-10-CM | POA: Diagnosis not present

## 2019-03-10 DIAGNOSIS — M545 Low back pain: Secondary | ICD-10-CM | POA: Diagnosis not present

## 2019-03-13 ENCOUNTER — Other Ambulatory Visit: Payer: Self-pay

## 2019-03-13 ENCOUNTER — Ambulatory Visit: Payer: Medicaid Other | Admitting: Cardiovascular Disease

## 2019-03-13 VITALS — BP 142/74 | HR 86 | Ht 74.0 in | Wt 347.0 lb

## 2019-03-13 DIAGNOSIS — R0683 Snoring: Secondary | ICD-10-CM | POA: Diagnosis not present

## 2019-03-13 DIAGNOSIS — I480 Paroxysmal atrial fibrillation: Secondary | ICD-10-CM

## 2019-03-13 DIAGNOSIS — E118 Type 2 diabetes mellitus with unspecified complications: Secondary | ICD-10-CM | POA: Diagnosis not present

## 2019-03-13 DIAGNOSIS — Z7901 Long term (current) use of anticoagulants: Secondary | ICD-10-CM

## 2019-03-13 DIAGNOSIS — I4819 Other persistent atrial fibrillation: Secondary | ICD-10-CM | POA: Diagnosis not present

## 2019-03-13 DIAGNOSIS — E785 Hyperlipidemia, unspecified: Secondary | ICD-10-CM

## 2019-03-13 DIAGNOSIS — Z794 Long term (current) use of insulin: Secondary | ICD-10-CM | POA: Diagnosis not present

## 2019-03-13 DIAGNOSIS — I10 Essential (primary) hypertension: Secondary | ICD-10-CM

## 2019-03-13 NOTE — Telephone Encounter (Signed)
Patient has OV with MD today 03/13/19

## 2019-03-13 NOTE — Patient Instructions (Signed)
Follow-Up: IN 4 WEEKS  In Person Shelva Majestic, MD.    Your physician has recommended that you have a HOME sleep study. This test records several body functions during sleep, including: brain activity, eye movement, oxygen and carbon dioxide blood levels, heart rate and rhythm, breathing rate and rhythm, the flow of air through your mouth and nose, snoring, body muscle movements, and chest and belly movement.   Medication Instructions:  The current medical regimen is effective;  continue present plan and medications as directed. Please refer to the Current Medication list given to you today. If you need a refill on your cardiac medications before your next appointment, please call your pharmacy.  At [facility], you and your health needs are our priority.  As part of our continuing mission to provide you with exceptional heart care, we have created designated Provider Care Teams.  These Care Teams include your primary Cardiologist (physician) and Advanced Practice Providers (APPs -  Physician Assistants and Nurse Practitioners) who all work together to provide you with the care you need, when you need it.  Thank you for choosing [facility]!!        Happy Holidays!!

## 2019-03-13 NOTE — Progress Notes (Signed)
Cardiology Office Note    Date:  03/14/2019   ID:  Jeffery [name], DOB Jul 20, 1960, MRN [id number]  PCP:  [name], MD  Cardiologist:  Shelva Majestic, MD   No chief complaint on file.   History of Present Illness:  Jeffery [name] is a 58 y.o. male who is the son of my former patient JefferyGeorge Owens [name]. He established cardiology care with me in December 2018.  He presents to the office today in follow-up of his recurrent atrial fibrillation detected on his February 13, 2019 preoperative cardiology evaluation.  Jeffery [name] has a history of obesity, diabetes mellitus, and hypertension.  He previously had a significant EtOH history and was drinking up to 4 cases of beer per week.  In May after having significant amount of beer the night before he woke up in the morning feeling weak.  He presented and was found to be in atrial fibrillation of new onset.  He was placed on Lopressor and eliquis and underwent successful cardioversion.  His cha2ds2vasc score was 2.  He was seen in A. fib clinic in follow-up on 08/27/2016 and was in sinus rhythm with PACs.  An echo Doppler study showed an EF of 50-55% with grade 1 diastolic dysfunction.  His left atrium was moderately dilated.  There was trivial MR.  He had done well maintaining sinus rhythm until August when again he had another alcohol binge and was back in atrial fibrillation.  He had been having difficulty with his back and had been off eliquis a week earlier to allow for treatment with ibuprofen.  He again was cardioverted for his A. fib with a rate of 169 on presentation and eliquis was resumed.  The patient has not had any alcohol since that presentation and quit smoking.    When I saw him in December 2018 he was maintaining sinus rhythm. He was having significant difficulty with his back. He has a Charcot foot and has difficulty walking. He denied any chest tightness or chest pressure.  He was unable to exercise due to his back discomfort.  He had run out of his metoprolol as pulses in the 90s with PACs.. I further titrated metoprolol to 75 mg twice a day, and he has continued to be on losartan 50 mg daily for HTN.   I saw him in March 2019 at which time he had noticed occasional palpitations.  He was unable to exercise due to his back and foot.  He quit tobacco and alcohol on November 01, 2016.  He saw Jeffery [name] following an ER evaluation  with complaints of dyspnea at rest.  He thought he had gone back into atrial fibrillation but in the emergency room he was in normal sinus rhythm.  Troponins were negative.  A close friend had recently had CABG revascularization.  He subsequently was referred for an nuclear perfusion study which was normal on November 11, 2017.  An echo Doppler study revealed normal EF at 55 to 60% with grade 1 diastolic dysfunction.  Peak PA pressure was minimally increased at 33 mm.  He has chronic pain syndrome and apparently the pain clinic prescribes Percocet.  He lives with his mother.  He is followed at [facility] clinic by Jeffery [name].  I saw him in October 2019 at which time he was remaining stable.  He continues to be morbidly obese and we discussed the importance of weight loss.  We also discussed evaluating him for sleep apnea.  He was unaware  of any recurrent episodes of atrial fibrillation.  We discussed the importance of exercise and followed up laboratory.  His last evaluated on February 13, 2019 prior to undergoing rotator cuff surgery which was tentatively scheduled for the following week.  Over several weeks prior to that evaluation he had noticed increasing shortness of breath particularly with walking up steps.  He was unaware of any change in his rhythm.  He has not had any EtOH since November 01, 2016.  He also quit tobacco.  Over the past several months he is tried hard to improve his diet with reference to his diabetes and his hemoglobin A1c had reduced from 8.5 to 6.2.  During my  evaluation, he was found to be in atrial fibrillation of questionable duration.  He was unaware of being in the abnormal rhythm but when informed of being in atrial fibrillation he felt this correlated with his recent development of shortness of breath.  During that evaluation I recommended he defer his elective shoulder surgery and initiated amiodarone 200 mg twice a day.  He was continuing to take Eliquis 5 mg twice a day.  We further recommended that he either decrease the dose or discontinue his muscle spasm medication due to potential amiodarone interaction.  Since his evaluation, he underwent an echo Doppler study on February 28, 2019.  EF was 55 to 60% and there was moderate LVH.  He had mild dilation of his left atrium with moderate dilation of his right atrium.  There was mild MR, mild TR.  He had moderately elevated pulmonary artery systolic pressure at 41.9 millimeters of mercury.  At the time of my initial evaluation we discussed the possibility of evaluating him for sleep apnea.  He does not want to go to the lab for study.  He admits that his sleep is suboptimal.  There is snoring.  He continues to note his heart rate irregularity.  He presents for reevaluation.  Past Medical History:  Diagnosis Date  . Atrial fibrillation ([place])   . Charcot's joint of right foot   . DDD (degenerative disc disease), lumbar   . Diabetes mellitus without complication ([place])   . Fatty liver   . Hypertension   . Obesity   . Rotator cuff disorder   . Shoulder impingement, right     Past Surgical History:  Procedure Laterality Date  . AMPUTATION Left 10/02/2014   Procedure: Left Third toe amputation ;  Surgeon: [name], MD;  Location: [place];  Service: Orthopedics;  Laterality: Left;  Regular bed, wants to follow hip  . ANTERIOR CRUCIATE LIGAMENT REPAIR Right 90   reconstruction  . APPLICATION OF WOUND VAC Left 10/02/2014   Procedure: APPLICATION OF WOUND VAC; toe Surgeon: [name], MD;  Location: [place];  Service: Orthopedics;  Laterality: Left;  . I&D EXTREMITY Left 10/05/2014   Procedure: IRRIGATION AND DEBRIDEMENT LEFT FOOT;  Surgeon: [name], MD;  Location: [place];  Service: Orthopedics;  Laterality: Left;  . KNEE ARTHROSCOPY W/ ACL RECONSTRUCTION Right   . TOTAL KNEE ARTHROPLASTY Right 03/28/2015  . TOTAL KNEE ARTHROPLASTY Right 03/28/2015   Procedure: RIGHT TOTAL KNEE ARTHROPLASTY;  Surgeon: [name], MD;  Location: [place];  Service: Orthopedics;  Laterality: Right;    Current Medications: Outpatient Medications Prior to Visit  Medication Sig Dispense Refill  . Accu-Chek FastClix Lancets MISC Use as directed to check blood sugar at least twice daily. E11.8 E11.65 Z79.4 102 each 11  . amiodarone (  PACERONE) 200 MG tablet Take 1 tablet (200 mg total) by mouth 2 (two) times daily. 60 tablet 3  . apixaban (ELIQUIS) 5 MG TABS tablet Take 1 tablet (5 mg total) by mouth 2 (two) times daily. 60 tablet 6  . atorvastatin (LIPITOR) 20 MG tablet Take 1 tablet (20 mg total) by mouth daily. 90 tablet 1  . Blood Glucose Monitoring Suppl (ACCU-CHEK AVIVA) device Use as instructed to check blood sugar 2 times daily. E11.8 E11.65 Z79.4 1 each 0  . gabapentin (NEURONTIN) 300 MG capsule Take 1 capsule (300 mg total) by mouth at bedtime. (Patient taking differently: Take 300 mg by mouth at bedtime as needed (pain). ) 90 capsule 1  . glucose blood (ACCU-CHEK AVIVA) test strip Use as instructed to check blood sugar 2 times daily. E11.8 E11.65 Z79.4 100 each 12  . Insulin Glargine (LANTUS SOLOSTAR) 100 UNIT/ML Solostar Pen Inject 50 Units into the skin 2 (two) times daily. 30 mL 6  . insulin lispro (INSULIN LISPRO) 100 UNIT/ML KwikPen Junior Inject 0.08 mLs (8 Units total) into the skin 3 (three) times daily. 30 mL 1  . Insulin Pen Needle (B-D ULTRAFINE III SHORT PEN) 31G X 8 MM MISC 1 each by Does not apply route 3 (three) times daily. 100 each 5  . Insulin Syringe-Needle U-100 (TRUEPLUS INSULIN  SYRINGE) 30G X 5/16" 0.5 ML MISC Use as directed 3 times daily 100 each 5  . liraglutide (VICTOZA) 18 MG/3ML SOPN Inject subcutaneously daily at breakfast 0.6 mL x1 week then 1.2 mL x 1 week then 1.8 thereafter (Patient taking differently: Inject 1.8 mg into the skin daily. ) 30 mL 3  . losartan (COZAAR) 100 MG tablet Take 1 tablet (100 mg total) by mouth daily. (Patient taking differently: Take 100 mg by mouth daily. Take a half tablet daily.) 30 tablet 6  . losartan (COZAAR) 50 MG tablet Take 50 mg by mouth daily.    . metoprolol tartrate (LOPRESSOR) 100 MG tablet Take 1 tablet (100 mg total) by mouth 2 (two) times daily. 180 tablet 1  . oxyCODONE-acetaminophen (PERCOCET) 10-325 MG tablet Take 1 tablet by mouth every 4 (four) hours as needed for pain.    . sodium chloride (OCEAN) 0.65 % SOLN nasal spray Place 1 spray into both nostrils 2 (two) times daily as needed for congestion.    Marland Kitchen testosterone cypionate (DEPOTESTOSTERONE CYPIONATE) 200 MG/ML injection Inject 1 mL (200 mg total) into the muscle every 28 (twenty-eight) days. 10 mL 3  . tiZANidine (ZANAFLEX) 4 MG tablet Take 1 tablet (4 mg total) by mouth at bedtime. 90 tablet 1  . TRUEPLUS INSULIN SYRINGE 30G X 5/16" 0.5 ML MISC USE AS DIRECTED 3 TIMES DAILY 100 each 0  . docusate sodium (COLACE) 100 MG capsule Take 100 mg by mouth daily as needed for mild constipation.    . lidocaine (LIDODERM) 5 % PLACE 1 PATCH DAILY ONTO THE SKIN. REMOVE AND DISCARD PATCH WITHIN 12 HOURS OR AS DIRECTED BY MD. 30 patch 0  . oxyCODONE-acetaminophen (PERCOCET) 5-325 MG tablet 1-2 tabs po bid prn pain (Patient taking differently: Take 1 tablet by mouth every 4 (four) hours as needed for moderate pain. ) 30 tablet 0   No facility-administered medications prior to visit.     Allergies:   Metformin and related   Social History   Socioeconomic History  . Marital status: Single    Spouse name: Not on file  . Number of children: Not on file  .  Years of  education: Not on file  . Highest education level: Not on file  Occupational History  . Occupation: Management consultant  Tobacco Use  . Smoking status: Former Smoker    Packs/day: 0.00    Years: 38.00    Pack years: 0.00    Types: Cigarettes  . Smokeless tobacco: Never Used  Substance and Sexual Activity  . Alcohol use: No    Alcohol/week: 5.0 - 6.0 standard drinks    Types: 5 - 6 Cans of beer per week  . Drug use: Yes    Types: Marijuana    Comment: last week ; denies 03/24/17  . Sexual activity: Not on file  Other Topics Concern  . Not on file  Social History Narrative   Lives alone.   Social Determinants of Health   Financial Resource Strain:   . Difficulty of Paying Living Expenses: Not on file  Food Insecurity:   . Worried About Charity fundraiser in the Last Year: Not on file  . Ran Out of Food in the Last Year: Not on file  Transportation Needs:   . Lack of Transportation (Medical): Not on file  . Lack of Transportation (Non-Medical): Not on file  Physical Activity:   . Days of Exercise per Week: Not on file  . Minutes of Exercise per Session: Not on file  Stress:   . Feeling of Stress : Not on file  Social Connections:   . Frequency of Communication with Friends and Family: Not on file  . Frequency of Social Gatherings with Friends and Family: Not on file  . Attends Religious Services: Not on file  . Active Member of Clubs or Organizations: Not on file  . Attends Archivist Meetings: Not on file  . Marital Status: Not on file    Socially, never married.  He has no children.  He is currently living with his mother.  He is not working.  Previously had done jobs.  Family History:  The patient's family history includes Diabetes in his father; Heart failure in his father; Hypertension in his father.  His father died at age 67.  Mother is still living at age 62.  ROS General: Negative; No fevers, chills, or night sweats;  HEENT: Negative; No  changes in vision or hearing, sinus congestion, difficulty swallowing Pulmonary: Negative; No cough, wheezing, shortness of breath, hemoptysis Cardiovascular:  GI: Negative; No nausea, vomiting, diarrhea, or abdominal pain GU: Negative; No dysuria, hematuria, or difficulty voiding Musculoskeletal: Chronic pain due to spinal stenosis and Charcot foot Hematologic/Oncology: Negative; no easy bruising, bleeding Endocrine: Negative; no heat/cold intolerance; no diabetes Neuro: Negative; no changes in balance, headaches Skin: Negative; No rashes or skin lesions Psychiatric: Negative; No behavioral problems, depression Sleep: Positive for snoring, daytime sleepiness, hypersomnolence, bruxism, restless legs, hypnogognic hallucinations, no cataplexy Other comprehensive 14 point system review is negative.   PHYSICAL EXAM:   BP (!) 142/74   Pulse 86   Ht _0  (1.88 m)   Wt (!) 347 lb (157.4 kg)   SpO2 92%   BMI 44.55 kg/m    Repeat blood pressure by me was 128/84  Wt Readings from Last 3 Encounters:  03/13/19 (!) 347 lb (157.4 kg)  02/13/19 (!) 337 lb 12.8 oz (153.2 kg)  12/26/18 (!) 336 lb (152.4 kg)      Physical Exam BP (!) 142/74   Pulse 86   Ht _1  (1.88 m)   Wt (!) 347 lb (157.4  kg)   SpO2 92%   BMI 44.55 kg/m  General: Alert, oriented, no distress.  Skin: normal turgor, no rashes, warm and dry HEENT: Normocephalic, atraumatic. Pupils equal round and reactive to light; sclera anicteric; extraocular muscles intact; Fundi ** Nose without nasal septal hypertrophy Mouth/Parynx benign; Mallinpatti scale Neck: No JVD, no carotid bruits; normal carotid upstroke Lungs: clear to ausculatation and percussion; no wheezing or rales Chest wall: without tenderness to palpitation Heart: PMI not displaced, RRR, s1 s2 normal, 1/6 systolic murmur, no diastolic murmur, no rubs, gallops, thrills, or heaves Abdomen: soft, nontender; no hepatosplenomehaly, BS+; abdominal aorta nontender and  not dilated by palpation. Back: no CVA tenderness Pulses 2+ Musculoskeletal: full range of motion, normal strength, no joint deformities Extremities: no clubbing cyanosis or edema, Homan's sign negative  Neurologic: grossly nonfocal; Cranial nerves grossly wnl Psychologic: Normal mood and affect    General: Alert, oriented, no distress.  Morbidly obese Skin: normal turgor, no rashes, warm and dry HEENT: Normocephalic, atraumatic. Pupils equal round and reactive to light; sclera anicteric; extraocular muscles intact; Nose without nasal septal hypertrophy Mouth/Parynx benign; Mallinpatti scale 4 Neck: Thick neck; no JVD, no carotid bruits; normal carotid upstroke Lungs: clear to ausculatation and percussion; no wheezing or rales Chest wall: without tenderness to palpitation Heart: PMI not displaced, RRR, s1 s2 normal, 1/6 systolic murmur, no diastolic murmur, no rubs, gallops, thrills, or heaves Abdomen: Central adiposity; soft, nontender; no hepatosplenomehaly, BS+; abdominal aorta nontender and not dilated by palpation. Back: no CVA tenderness Pulses 2+ Musculoskeletal: full range of motion, normal strength, no joint deformities Extremities: no clubbing cyanosis or edema, Homan's sign negative  Neurologic: grossly nonfocal; Cranial nerves grossly wnl Psychologic: Normal mood and affect   Studies/Labs Reviewed:   EKG:  EKG is ordered today.  ECG (independently read by me): Atrial fibrillation at 79 bpm.  Incomplete right bundle branch block.  QTc interval 449 ms.  February 13, 2019 ECG (independently read by me): Atrial fibrillation at 86 bpm; QTc 440 msec  October 2019 ECG (independently read by me): Sinus rhythm at 71 bpm.  No ectopy.  Normal intervals.  March 2019 ECG (independently read by me): normal sinus rhythm at 88 bpm. QTc interval 433 ms.  December 2018 ECG (independently read by me): Sinus rhythm in the 90s with PACs.  QTc interval 442 ms.  Recent Labs: BMP Latest  Ref Rng & Units 02/13/2019 09/21/2018 10/24/2017  Glucose 65 - 99 mg/dL 167(H) 164(H) 277(H)  BUN 6 - 24 mg/dL _0 Creatinine 0.76 - 1.27 mg/dL 0.96 1.03 1.13  BUN/Creat Ratio 9 - _1 -  Sodium 134 - 144 mmol/L 138 137 134(L)  Potassium 3.5 - 5.2 mmol/L 5.0 4.4 4.6  Chloride 96 - 106 mmol/L 100 103 101  CO2 20 - 29 mmol/L _2 Calcium 8.7 - 10.2 mg/dL 9.7 9.1 9.4     Hepatic Function Latest Ref Rng & Units 02/13/2019 09/21/2018 04/15/2017  Total Protein 6.0 - 8.5 g/dL 7.6 6.7 CANCELED  Albumin 3.8 - 4.9 g/dL 4.5 4.1 CANCELED  AST 0 - 40 IU/L 19 12 CANCELED  ALT 0 - 44 IU/L 25 18 CANCELED  Alk Phosphatase 39 - 117 IU/L 102 76 CANCELED  Total Bilirubin 0.0 - 1.2 mg/dL 0.7 0.3 CANCELED    CBC Latest Ref Rng & Units 02/13/2019 09/21/2018 06/15/2018  WBC 3.4 - 10.8 x10E3/uL 11.8(H) 7.8 10.8  Hemoglobin 13.0 - 17.7 g/dL 16.1 14.1 14.9  Hematocrit 37.5 -  51.0 % 48.8 44.5 45.4  Platelets 150 - 450 x10E3/uL 250 197 247   Lab Results  Component Value Date   MCV 83 02/13/2019   MCV 87 09/21/2018   MCV 85 06/15/2018   Lab Results  Component Value Date   TSH 2.490 02/13/2019   Lab Results  Component Value Date   HGBA1C 6.2 12/26/2018     BNP No results found for: BNP  ProBNP No results found for: PROBNP   Lipid Panel     Component Value Date/Time   CHOL 157 04/04/2018 1103   TRIG 256 (H) 04/04/2018 1103   HDL 42 04/04/2018 1103   CHOLHDL 3.7 04/04/2018 1103   CHOLHDL 3.8 04/10/2010 0315   VLDL 32 04/10/2010 0315   LDLCALC 64 04/04/2018 1103     RADIOLOGY: ECHOCARDIOGRAM COMPLETE  Result Date: 02/28/2019   ECHOCARDIOGRAM REPORT   Patient Name:   Jeffery [name] Date of Exam: 02/28/2019 Medical Rec #:  [id number]           Height:       74.0 in Accession #:    [id number]          Weight:       337.8 lb Date of Birth:  [birth year]           BSA:          2.72 m Patient Age:    2 years            BP:           156/101 mmHg Patient Gender: M                    HR:           82 bpm. Exam Location:  [facility] Procedure: 2D Echo, Cardiac Doppler and Color Doppler Indications:    I48.19 Atrial Fibrillation  History:        Patient has prior history of Echocardiogram examinations, most                 recent 11/15/2017. Signs/Symptoms:Shortness of Breath; Risk                 Factors:Hypertension, Diabetes and Obesity, HLD.  Sonographer:    Marygrace Drought RCS Referring Phys: [place]  1. Left ventricular ejection fraction, by visual estimation, is 55 to 60%. The left ventricle has normal function. There is moderately increased left ventricular hypertrophy.  2. Left ventricular diastolic function could not be evaluated.  3. Global right ventricle has mildly reduced systolic function.The right ventricular size is normal. No increase in right ventricular wall thickness.  4. Left atrial size was mildly dilated.  5. Right atrial size was moderately dilated.  6. The mitral valve is normal in structure. Mild mitral valve regurgitation. No evidence of mitral stenosis.  7. The tricuspid valve is normal in structure. Tricuspid valve regurgitation is mild.  8. The aortic valve is tricuspid. Aortic valve regurgitation is not visualized. No evidence of aortic valve sclerosis or stenosis.  9. The pulmonic valve was normal in structure. Pulmonic valve regurgitation is not visualized. 10. Moderately elevated pulmonary artery systolic pressure. 11. The atrial septum is grossly normal. FINDINGS  Left Ventricle: Left ventricular ejection fraction, by visual estimation, is 55 to 60%. The left ventricle has normal function. There is moderately increased left ventricular hypertrophy. Concentric left ventricular hypertrophy. The left ventricular diastology could not be evaluated due to atrial fibrillation. Left ventricular  diastolic function could not be evaluated. Right Ventricle: The right ventricular size is normal. No increase in right ventricular wall thickness.  Global RV systolic function is has mildly reduced systolic function. The tricuspid regurgitant velocity is 1.47 m/s, and with an assumed right atrial pressure of 33 mmHg, the estimated right ventricular systolic pressure is moderately elevated at 41.6 mmHg. Left Atrium: Left atrial size was mildly dilated. Right Atrium: Right atrial size was moderately dilated Pericardium: There is no evidence of pericardial effusion. Mitral Valve: The mitral valve is normal in structure. No evidence of mitral valve stenosis by observation. Mild mitral valve regurgitation. Tricuspid Valve: The tricuspid valve is normal in structure. Tricuspid valve regurgitation is mild. Aortic Valve: The aortic valve is tricuspid. Aortic valve regurgitation is not visualized. The aortic valve is structurally normal, with no evidence of sclerosis or stenosis. Pulmonic Valve: The pulmonic valve was normal in structure. Pulmonic valve regurgitation is not visualized. Aorta: The aortic root and ascending aorta are structurally normal, with no evidence of dilitation. IAS/Shunts: The atrial septum is grossly normal.  LEFT VENTRICLE PLAX 2D LVIDd:         5.63 cm  Diastology LVIDs:         4.34 cm  LV e' medial:   8.38 cm/s LV PW:         1.35 cm  LV E/e' medial: 11.2 LV IVS:        1.40 cm LVOT diam:     2.30 cm LV SV:         71 ml LV SV Index:   24.40 LVOT Area:     4.15 cm  RIGHT VENTRICLE RV Basal diam:  4.33 cm RV S prime:     10.60 cm/s TAPSE (M-mode): 1.8 cm RVSP:           11.6 mmHg LEFT ATRIUM              Index       RIGHT ATRIUM           Index LA diam:        5.10 cm  1.88 cm/m  RA Pressure: 3.00 mmHg LA Vol (A2C):   95.4 ml  35.13 ml/m RA Area:     27.00 cm LA Vol (A4C):   113.0 ml 41.61 ml/m RA Volume:   94.20 ml  34.69 ml/m LA Biplane Vol: 108.0 ml 39.77 ml/m  AORTIC VALVE LVOT Vmax:   68.35 cm/s LVOT Vmean:  40.650 cm/s LVOT VTI:    0.122 m  AORTA Ao Root diam: 3.40 cm MITRAL VALVE                       TRICUSPID VALVE MV Area  (PHT):                     TR Peak grad:   8.6 mmHg MV PHT:                            TR Vmax:        206.00 cm/s MV Decel Time: 243 msec            Estimated RAP:  3.00 mmHg MV E velocity: 93.65 cm/s 103 cm/s RVSP:           11.6 mmHg  SHUNTS                                    Systemic VTI:  0.12 m                                    Systemic Diam: 2.30 cm  Mertie Moores MD Electronically signed by Mertie Moores MD Signature Date/Time: 02/28/2019/4:22:06 PM    Final      Additional studies/ records that were reviewed today include:  I reviewed the patient's atrial fibrillation clinic records as well as his ER visits.  I reviewed his most recent echo Doppler study and nuclear perfusion studies of August 2019  ------------------------------------------------------------------- 08/18/2016 ECHO Study Conclusions  - Left ventricle: The cavity size was mildly dilated. Wall   thickness was increased in a pattern of mild LVH. Systolic   function was normal. The estimated ejection fraction was in the   range of 50% to 55%. Wall motion was normal; there were no   regional wall motion abnormalities. Doppler parameters are   consistent with abnormal left ventricular relaxation (grade 1   diastolic dysfunction). - Aortic valve: Transvalvular velocity was within the normal range.   There was no stenosis. There was no regurgitation. - Mitral valve: Transvalvular velocity was within the normal range.   There was no evidence for stenosis. There was trivial   regurgitation. - Left atrium: The atrium was moderately dilated. - Right ventricle: The cavity size was normal. Wall thickness was   normal. Systolic function was normal. - Tricuspid valve: There was no regurgitation.  ------------------------------------------------------------------- August 2019  [facility] Conclusions  - Left ventricle: The cavity size was moderately dilated. Wall   thickness was normal.  Systolic function was normal. The estimated   ejection fraction was in the range of 55% to 60%. Wall motion was   normal; there were no regional wall motion abnormalities. Doppler   parameters are consistent with abnormal left ventricular   relaxation (grade 1 diastolic dysfunction). - Right ventricle: The cavity size was moderately dilated. Wall   thickness was normal. - Pulmonary arteries: Systolic pressure was mildly increased. PA   peak pressure: 33 mm Hg (S).   Gated SPECT myocardial perfusion study Highlights    Normal perfusion No ischemia or scar  LVEF is calculated at 48% Visual estimate suggests LVEF is greater Consider echo to confirm  The study is normal.  There was no ST segment deviation noted during stress.   ECHO 02/28/2019 IMPRESSIONS  1. Left ventricular ejection fraction, by visual estimation, is 55 to 60%. The left ventricle has normal function. There is moderately increased left ventricular hypertrophy.  2. Left ventricular diastolic function could not be evaluated.  3. Global right ventricle has mildly reduced systolic function.The right ventricular size is normal. No increase in right ventricular wall thickness.  4. Left atrial size was mildly dilated.  5. Right atrial size was moderately dilated.  6. The mitral valve is normal in structure. Mild mitral valve regurgitation. No evidence of mitral stenosis.  7. The tricuspid valve is normal in structure. Tricuspid valve regurgitation is mild.  8. The aortic valve is tricuspid. Aortic valve regurgitation is not visualized. No evidence of aortic valve sclerosis or stenosis.  9. The pulmonic valve was normal in structure. Pulmonic valve regurgitation is not visualized. 10. Moderately elevated pulmonary  artery systolic pressure. 11. The atrial septum is grossly normal.  ASSESSMENT:    1. Persistent atrial fibrillation ([place])   2. Anticoagulation adequate   3. Essential hypertension   4. Snoring: Evaluate for  OSA   5. Morbid obesity ([place])   6. Type 2 diabetes mellitus with complication, with long-term current use of insulin ([place])   7. Hyperlipidemia with target LDL less than 70     PLAN:  [name] is a 58 year old gentleman who has a long-standing history of obesity, and has been on diabetic medication since 2016.  In addition, has a history of hypertension and hyperlipidemia.  He previously had significant EtOH history and his 2 episodes of atrial fibrillation in 2018 occurred after significant alcohol binging.  He is not had any alcohol since his last cardioversion in August 2018 and also at that time quit tobacco.  An echo Doppler study in August 2019 continue to show normal systolic function with EF at 55 to 60%.  There was evidence for grade 1 diastolic dysfunction.  There was mild pulmonary hypertension with a PA systolic pressure 33 mm.  He has had significant improvement with his diabetes with recent hemoglobin A1c being reduced from 8.5 down to 6.2.  He has been on losartan 50 mg, metoprolol 100 mg twice a day for blood pressure control.  Seen prior to undergoing elective shoulder surgery he was found to be in atrial fibrillation of probably at least several weeks duration if not longer.  Amiodarone was instituted at 200 mg twice a day and he has been off his muscle relaxant pills since due to potential interaction.  ECG today confirms that he is still in persistent atrial fibrillation with ventricular rate slightly improved at 79.  He continues to be on metoprolol tartrate 100 mg twice a day.  His blood pressure today is controlled with the addition of losartan 50 mg daily.  He is diabetic on insulin in addition to Victoza.  He continues to be on atorvastatin 20 mg daily for hyperlipidemia.  I reviewed recent laboratory.  Hemoglobin hematocrit were stable.  Blood sugar was increased at 167.  Renal function was stable with a creatinine of 0.96.  Magnesium was slightly increased at 2.6.  TSH  was normal at 2.49.  Again discussed the patient's sleep.  I have a high index of suspicion that he has significant sleep apnea and I discussed with him the implications of untreated sleep apnea with reference to recurrent atrial fibrillation.  After much discussion he has agreed to undergo a home study for baseline assessment.  He will continue with his current dose of amiodarone 40 mg daily for the next month.  At that time he should be fully loaded.  He will continue anticoagulation.  I will see him back in the office in 4 weeks for reassessment and if he is still in atrial fibrillation plans will be made to attempt cardioversion.  We discussed the importance of weight loss with his morbid obesity and body mass index of 44.55.   Medication Adjustments/Labs and Tests Ordered: Current medicines are reviewed at length with the patient today.  Concerns regarding medicines are outlined above.  Medication changes, Labs and Tests ordered today are listed in the Patient Instructions below. Patient Instructions  Follow-Up: IN 4 WEEKS  In Person Shelva Majestic, MD.    Your physician has recommended that you have a HOME sleep study. This test records several body functions during sleep, including: brain activity, eye movement, oxygen  and carbon dioxide blood levels, heart rate and rhythm, breathing rate and rhythm, the flow of air through your mouth and nose, snoring, body muscle movements, and chest and belly movement.   Medication Instructions:  The current medical regimen is effective;  continue present plan and medications as directed. Please refer to the Current Medication list given to you today. If you need a refill on your cardiac medications before your next appointment, please call your pharmacy.  At [facility], you and your health needs are our priority.  As part of our continuing mission to provide you with exceptional heart care, we have created designated Provider Care Teams.  These Care  Teams include your primary Cardiologist (physician) and Advanced Practice Providers (APPs -  Physician Assistants and Nurse Practitioners) who all work together to provide you with the care you need, when you need it.  Thank you for choosing [facility] at [facility]!!        Happy Holidays!!       Signed, Shelva Majestic, MD  03/14/2019 6:18 PM    [facility] [address] [place], [place], [place]  [address] Phone: [phone]

## 2019-03-13 NOTE — Telephone Encounter (Signed)
Patient has OV with MD today

## 2019-03-14 ENCOUNTER — Encounter: Payer: Self-pay | Admitting: Cardiovascular Disease

## 2019-03-14 ENCOUNTER — Telehealth: Payer: Self-pay | Admitting: *Deleted

## 2019-03-14 NOTE — Telephone Encounter (Signed)
Left message to return a call to discuss HST appointment.

## 2019-03-19 NOTE — Telephone Encounter (Signed)
Reviewed with pt at [facility]

## 2019-03-28 ENCOUNTER — Other Ambulatory Visit: Payer: Self-pay

## 2019-03-28 ENCOUNTER — Encounter: Payer: Self-pay | Admitting: Family Medicine

## 2019-03-28 ENCOUNTER — Other Ambulatory Visit: Payer: Self-pay | Admitting: Family Medicine

## 2019-03-28 ENCOUNTER — Ambulatory Visit: Payer: Medicaid Other | Attending: Family Medicine | Admitting: Family Medicine

## 2019-03-28 VITALS — BP 133/86 | HR 76 | Temp 98.2°F | Ht 75.0 in | Wt 350.0 lb

## 2019-03-28 DIAGNOSIS — M5441 Lumbago with sciatica, right side: Secondary | ICD-10-CM | POA: Diagnosis not present

## 2019-03-28 DIAGNOSIS — E785 Hyperlipidemia, unspecified: Secondary | ICD-10-CM

## 2019-03-28 DIAGNOSIS — E1142 Type 2 diabetes mellitus with diabetic polyneuropathy: Secondary | ICD-10-CM | POA: Diagnosis not present

## 2019-03-28 DIAGNOSIS — I480 Paroxysmal atrial fibrillation: Secondary | ICD-10-CM

## 2019-03-28 DIAGNOSIS — E1159 Type 2 diabetes mellitus with other circulatory complications: Secondary | ICD-10-CM

## 2019-03-28 DIAGNOSIS — G8929 Other chronic pain: Secondary | ICD-10-CM

## 2019-03-28 DIAGNOSIS — Z79899 Other long term (current) drug therapy: Secondary | ICD-10-CM | POA: Diagnosis not present

## 2019-03-28 DIAGNOSIS — E1169 Type 2 diabetes mellitus with other specified complication: Secondary | ICD-10-CM

## 2019-03-28 DIAGNOSIS — E1165 Type 2 diabetes mellitus with hyperglycemia: Secondary | ICD-10-CM

## 2019-03-28 DIAGNOSIS — I10 Essential (primary) hypertension: Secondary | ICD-10-CM

## 2019-03-28 DIAGNOSIS — I4819 Other persistent atrial fibrillation: Secondary | ICD-10-CM | POA: Diagnosis not present

## 2019-03-28 DIAGNOSIS — Z794 Long term (current) use of insulin: Secondary | ICD-10-CM

## 2019-03-28 DIAGNOSIS — Z1159 Encounter for screening for other viral diseases: Secondary | ICD-10-CM | POA: Diagnosis not present

## 2019-03-28 DIAGNOSIS — M5442 Lumbago with sciatica, left side: Secondary | ICD-10-CM | POA: Diagnosis not present

## 2019-03-28 DIAGNOSIS — IMO0002 Reserved for concepts with insufficient information to code with codable children: Secondary | ICD-10-CM

## 2019-03-28 LAB — POCT GLYCOSYLATED HEMOGLOBIN (HGB A1C): HbA1c, POC (controlled diabetic range): 7.6 % — AB (ref 0.0–7.0)

## 2019-03-28 LAB — GLUCOSE, POCT (MANUAL RESULT ENTRY): POC Glucose: 175 mg/dl — AB (ref 70–99)

## 2019-03-28 MED ORDER — APIXABAN 5 MG PO TABS: 5.0000 mg | ORAL_TABLET | Freq: Two times a day (BID) | ORAL | 6 refills | Status: DC

## 2019-03-28 MED ORDER — VICTOZA 18 MG/3ML ~~LOC~~ SOPN: 1.8000 mg | PEN_INJECTOR | Freq: Every day | SUBCUTANEOUS | 6 refills | Status: DC

## 2019-03-28 MED ORDER — HUMALOG JUNIOR KWIKPEN 100 UNIT/ML ~~LOC~~ SOPN: 8.0000 [IU] | PEN_INJECTOR | Freq: Three times a day (TID) | SUBCUTANEOUS | 6 refills | Status: AC

## 2019-03-28 MED ORDER — ATORVASTATIN CALCIUM 20 MG PO TABS: 20.0000 mg | ORAL_TABLET | Freq: Every day | ORAL | 1 refills | Status: DC

## 2019-03-28 MED ORDER — LOSARTAN POTASSIUM 50 MG PO TABS: 50.0000 mg | ORAL_TABLET | Freq: Every day | ORAL | 1 refills | Status: DC

## 2019-03-28 MED ORDER — GABAPENTIN 300 MG PO CAPS: 300.0000 mg | ORAL_CAPSULE | Freq: Two times a day (BID) | ORAL | 1 refills | Status: DC

## 2019-03-28 MED ORDER — LANTUS SOLOSTAR 100 UNIT/ML ~~LOC~~ SOPN: 50.0000 [IU] | PEN_INJECTOR | Freq: Two times a day (BID) | SUBCUTANEOUS | 6 refills | Status: DC

## 2019-03-28 MED ORDER — METOPROLOL TARTRATE 100 MG PO TABS: 100.0000 mg | ORAL_TABLET | Freq: Two times a day (BID) | ORAL | 1 refills | Status: DC

## 2019-03-28 NOTE — Patient Instructions (Signed)

## 2019-03-28 NOTE — Progress Notes (Signed)
Pain in lower back, left shoulder, and right ankle.  Requesting increase on gabapentin.

## 2019-03-28 NOTE — Telephone Encounter (Signed)
Patient is being seen by Dr. Margarita Rana today. We received these requests. Will send to Dr. Smitty Pluck nurse.

## 2019-03-28 NOTE — Progress Notes (Signed)
Subjective:  Patient ID: Jeffery [name], male    DOB: 1961/02/11  Age: 58 y.o. MRN: [id number]  CC: Diabetes   HPI Jeffery [name] is a 58 year old male with history of type 2 diabetes mellitus (A1c7.6), hypertension, Charcot foot due to diabetes mellitus, A. fib, status post right rotator cuff repair who presents today for follow-up visit.  His A1c is 7.6 today which has trended up from 6.2 , three months ago and he endorses noncompliance with a diabetic diet due to the holidays. He has also been skipping Victoza because it makes him bloated.  His shoulder surgery was cancelled as he slipped back into Afib and had to placed on Amiodarone; his follow up is in 04/19/18 with cardiology.  He is skeptical about amiodarone due to the side effects. He continue to have some dyspnea while walking up his driveway and he is unable to lay flat on his back.  Denies presence of pedal edema.  He has chronic pain in his R ankle from his charcot foot (worse when he is on it for long periods) and his spine also hurts, he feels it radiates through his whole spine.  He would like an increase in his gabapentin dose as pain is 8/10. He sees pain management where he receives Percocets.  Past Medical History:  Diagnosis Date  . Atrial fibrillation ([place])   . Charcot's joint of right foot   . DDD (degenerative disc disease), lumbar   . Diabetes mellitus without complication ([place])   . Fatty liver   . Hypertension   . Obesity   . Rotator cuff disorder   . Shoulder impingement, right     Past Surgical History:  Procedure Laterality Date  . AMPUTATION Left 10/02/2014   Procedure: Left Third toe amputation ;  Surgeon: [name], MD;  Location: [facility];  Service: Orthopedics;  Laterality: Left;  Regular bed, wants to follow hip  . ANTERIOR CRUCIATE LIGAMENT REPAIR Right 90   reconstruction  . APPLICATION OF WOUND VAC Left 10/02/2014   Procedure: APPLICATION OF WOUND VAC; toe Surgeon: [name], MD;   Location: [facility];  Service: Orthopedics;  Laterality: Left;  . I & D EXTREMITY Left 10/05/2014   Procedure: IRRIGATION AND DEBRIDEMENT LEFT FOOT;  Surgeon: [name], MD;  Location: [facility];  Service: Orthopedics;  Laterality: Left;  . KNEE ARTHROSCOPY W/ ACL RECONSTRUCTION Right   . TOTAL KNEE ARTHROPLASTY Right 03/28/2015  . TOTAL KNEE ARTHROPLASTY Right 03/28/2015   Procedure: RIGHT TOTAL KNEE ARTHROPLASTY;  Surgeon: [name], MD;  Location: [facility];  Service: Orthopedics;  Laterality: Right;    Family History  Problem Relation Age of Onset  . Diabetes Father   . Hypertension Father   . Heart failure Father     Allergies  Allergen Reactions  . Metformin And Related Other (See Comments)    GI upset    Outpatient Medications Prior to Visit  Medication Sig Dispense Refill  . Accu-Chek FastClix Lancets MISC Use as directed to check blood sugar at least twice daily. E11.8 E11.65 Z79.4 102 each 11  . amiodarone (PACERONE) 200 MG tablet Take 1 tablet (200 mg total) by mouth 2 (two) times daily. 60 tablet 3  . apixaban (ELIQUIS) 5 MG TABS tablet Take 1 tablet (5 mg total) by mouth 2 (two) times daily. 60 tablet 6  . atorvastatin (LIPITOR) 20 MG tablet Take 1 tablet (20 mg total) by mouth daily. 90 tablet 1  .  Blood Glucose Monitoring Suppl (ACCU-CHEK AVIVA) device Use as instructed to check blood sugar 2 times daily. E11.8 E11.65 Z79.4 1 each 0  . gabapentin (NEURONTIN) 300 MG capsule Take 1 capsule (300 mg total) by mouth at bedtime. (Patient taking differently: Take 300 mg by mouth at bedtime as needed (pain). ) 90 capsule 1  . glucose blood (ACCU-CHEK AVIVA) test strip Use as instructed to check blood sugar 2 times daily. E11.8 E11.65 Z79.4 100 each 12  . Insulin Glargine (LANTUS SOLOSTAR) 100 UNIT/ML Solostar Pen Inject 50 Units into the skin 2 (two) times daily. 30 mL 6  . insulin lispro (INSULIN LISPRO) 100 UNIT/ML KwikPen Junior Inject 0.08 mLs (8 Units total) into the skin 3  (three) times daily. 30 mL 1  . Insulin Pen Needle (B-D ULTRAFINE III SHORT PEN) 31G X 8 MM MISC 1 each by Does not apply route 3 (three) times daily. 100 each 5  . Insulin Syringe-Needle U-100 (TRUEPLUS INSULIN SYRINGE) 30G X 5/16" 0.5 ML MISC Use as directed 3 times daily 100 each 5  . liraglutide (VICTOZA) 18 MG/3ML SOPN Inject subcutaneously daily at breakfast 0.6 mL x1 week then 1.2 mL x 1 week then 1.8 thereafter (Patient taking differently: Inject 1.8 mg into the skin daily. ) 30 mL 3  . losartan (COZAAR) 100 MG tablet Take 1 tablet (100 mg total) by mouth daily. (Patient taking differently: Take 100 mg by mouth daily. Take a half tablet daily.) 30 tablet 6  . losartan (COZAAR) 50 MG tablet Take 50 mg by mouth daily.    . metoprolol tartrate (LOPRESSOR) 100 MG tablet Take 1 tablet (100 mg total) by mouth 2 (two) times daily. 180 tablet 1  . oxyCODONE-acetaminophen (PERCOCET) 10-325 MG tablet Take 1 tablet by mouth every 4 (four) hours as needed for pain.    . sodium chloride (OCEAN) 0.65 % SOLN nasal spray Place 1 spray into both nostrils 2 (two) times daily as needed for congestion.    Marland Kitchen tiZANidine (ZANAFLEX) 4 MG tablet Take 1 tablet (4 mg total) by mouth at bedtime. 90 tablet 1  . TRUEPLUS INSULIN SYRINGE 30G X 5/16" 0.5 ML MISC USE AS DIRECTED 3 TIMES DAILY 100 each 0  . testosterone cypionate (DEPOTESTOSTERONE CYPIONATE) 200 MG/ML injection Inject 1 mL (200 mg total) into the muscle every 28 (twenty-eight) days. (Patient not taking: Reported on 03/28/2019) 10 mL 3   No facility-administered medications prior to visit.     ROS Review of Systems  Constitutional: Negative for activity change and appetite change.  HENT: Negative for sinus pressure and sore throat.   Eyes: Negative for visual disturbance.  Respiratory: Negative for cough, chest tightness and shortness of breath.   Cardiovascular: Negative for chest pain and leg swelling.  Gastrointestinal: Negative for abdominal  distention, abdominal pain, constipation and diarrhea.  Endocrine: Negative.   Genitourinary: Negative for dysuria.  Musculoskeletal: Negative for joint swelling and myalgias.       See HPI  Skin: Negative for rash.  Allergic/Immunologic: Negative.   Neurological: Negative for weakness, light-headedness and numbness.  Psychiatric/Behavioral: Negative for dysphoric mood and suicidal ideas.    Objective:  BP 133/86   Pulse 76   Temp 98.2 F (36.8 C) (Oral)   Ht 6\' 3"  (1.905 m)   Wt (!) 350 lb (158.8 kg)   SpO2 100%   BMI 43.75 kg/m   BP/Weight 03/28/2019 03/13/2019 29/56/2130  Systolic BP 865 784 696  Diastolic BP 86 74 85  Wt. (  Lbs) 350 347 337.8  BMI 43.75 44.55 43.37      Physical Exam Constitutional:      Appearance: He is well-developed.  Neck:     Vascular: No JVD.  Cardiovascular:     Rate and Rhythm: Normal rate. Rhythm irregular.     Heart sounds: Normal heart sounds. No murmur.  Pulmonary:     Effort: Pulmonary effort is normal.     Breath sounds: Normal breath sounds. No wheezing or rales.  Chest:     Chest wall: No tenderness.  Abdominal:     General: Bowel sounds are normal. There is no distension.     Palpations: Abdomen is soft. There is no mass.     Tenderness: There is no abdominal tenderness.  Musculoskeletal:        General: Normal range of motion.     Right lower leg: No edema.     Left lower leg: No edema.     Comments: No tenderness on palpation of entire lumbar spine Range of motion of ankle is normal Left shoulder abduction restricted to 90 degrees; abduction in right shoulder is normal  Neurological:     Mental Status: He is alert and oriented to person, place, and time.  Psychiatric:        Mood and Affect: Mood normal.     CMP Latest Ref Rng & Units 02/13/2019 09/21/2018 10/24/2017  Glucose 65 - 99 mg/dL 167(H) 164(H) 277(H)  BUN 6 - 24 mg/dL 18 15 15   Creatinine 0.76 - 1.27 mg/dL 0.96 1.03 1.13  Sodium 134 - 144 mmol/L 138 137  134(L)  Potassium 3.5 - 5.2 mmol/L 5.0 4.4 4.6  Chloride 96 - 106 mmol/L 100 103 101  CO2 20 - 29 mmol/L 21 24 25   Calcium 8.7 - 10.2 mg/dL 9.7 9.1 9.4  Total Protein 6.0 - 8.5 g/dL 7.6 6.7 -  Total Bilirubin 0.0 - 1.2 mg/dL 0.7 0.3 -  Alkaline Phos 39 - 117 IU/L 102 76 -  AST 0 - 40 IU/L 19 12 -  ALT 0 - 44 IU/L 25 18 -    Lipid Panel     Component Value Date/Time   CHOL 157 04/04/2018 1103   TRIG 256 (H) 04/04/2018 1103   HDL 42 04/04/2018 1103   CHOLHDL 3.7 04/04/2018 1103   CHOLHDL 3.8 04/10/2010 0315   VLDL 32 04/10/2010 0315   LDLCALC 64 04/04/2018 1103    CBC    Component Value Date/Time   WBC 11.8 (H) 02/13/2019 1611   WBC 9.0 10/24/2017 1725   RBC 5.87 (H) 02/13/2019 1611   RBC 5.48 10/24/2017 1725   HGB 16.1 02/13/2019 1611   HCT 48.8 02/13/2019 1611   PLT 250 02/13/2019 1611   MCV 83 02/13/2019 1611   MCH 27.4 02/13/2019 1611   MCH 27.9 10/24/2017 1725   MCHC 33.0 02/13/2019 1611   MCHC 32.1 10/24/2017 1725   RDW 13.5 02/13/2019 1611   LYMPHSABS 1.7 09/21/2018 1341   MONOABS 792 05/14/2016 1641   EOSABS 0.4 09/21/2018 1341   BASOSABS 0.0 09/21/2018 1341    Lab Results  Component Value Date   HGBA1C 7.6 (A) 03/28/2019    Assessment & Plan:   1. Type 2 diabetes mellitus with other circulatory complication, with long-term current use of insulin (HCC) Not fully optimized with A1c of 7.6; goal is less than 7 This could be explained by his skipping Victoza doses and nonadherence with a diabetic diet but he promises to  do better Counseled on Diabetic diet, my plate method, 591 minutes of moderate intensity exercise/week Blood sugar logs with fasting goals of 80-120 mg/dl, random of less than 180 and in the event of sugars less than 60 mg/dl or greater than 400 mg/dl encouraged to notify the clinic. Advised on the need for annual eye exams, annual foot exams, Pneumonia vaccine. - Glucose (CBG) - HgB A1c - Insulin Glargine (LANTUS SOLOSTAR) 100 UNIT/ML  Solostar Pen; Inject 50 Units into the skin 2 (two) times daily.  Dispense: 30 mL; Refill: 6 - insulin lispro (INSULIN LISPRO) 100 UNIT/ML KwikPen Junior; Inject 0.08 mLs (8 Units total) into the skin 3 (three) times daily.  Dispense: 30 mL; Refill: 6  2. Persistent atrial fibrillation (HCC) Currently in A. fib Continue metoprolol for rate control, Eliquis for anticoagulation Also on amiodarone -last TSH was normal; consider PFTs if he remains on this long-term Keep upcoming appointment with cardiology - apixaban (ELIQUIS) 5 MG TABS tablet; Take 1 tablet (5 mg total) by mouth 2 (two) times daily.  Dispense: 60 tablet; Refill: 6  3. Hyperlipidemia associated with type 2 diabetes mellitus ([place]) LDL is at goal but he does have hypertriglyceridemia Fish oil capsules will be beneficial Low-cholesterol diet - atorvastatin (LIPITOR) 20 MG tablet; Take 1 tablet (20 mg total) by mouth daily.  Dispense: 90 tablet; Refill: 1  4. Chronic midline low back pain with bilateral sciatica Uncontrolled Increased dose of gabapentin Followed by the pain clinic - gabapentin (NEURONTIN) 300 MG capsule; Take 1 capsule (300 mg total) by mouth 2 (two) times daily.  Dispense: 180 capsule; Refill: 1  5. Diabetic polyneuropathy associated with type 2 diabetes mellitus (HCC) Currently on gabapentin and I have increased his dose - liraglutide (VICTOZA) 18 MG/3ML SOPN; Inject 0.3 mLs (1.8 mg total) into the skin daily.  Dispense: 30 mL; Refill: 6  6. Essential hypertension Controlled Counseled on blood pressure goal of less than 130/80, low-sodium, DASH diet, medication compliance, 150 minutes of moderate intensity exercise per week. Discussed medication compliance, adverse effects. - metoprolol tartrate (LOPRESSOR) 100 MG tablet; Take 1 tablet (100 mg total) by mouth 2 (two) times daily.  Dispense: 180 tablet; Refill: 1 - losartan (COZAAR) 50 MG tablet; Take 1 tablet (50 mg total) by mouth daily. Take a half tablet  daily.  Dispense: 90 tablet; Refill: 1  7. High risk medication use Previously on testosterone We will check CBC and PSA - CBC with Differential/Platelet - PSA, total and free  8. Screening for viral disease - HIV antibody (with reflex)    HCM-referred for colonoscopy but he would like to hold off on this until after his shoulder surgery  [name], MD, FAAFP. [facility], [place]   03/28/2019, 11:29 AM

## 2019-03-29 LAB — PSA, TOTAL AND FREE
PSA, Free Pct: >30 %
PSA, Free: 0.03 ng/mL
Prostate Specific Ag, Serum: <0.1 ng/mL (ref 0.0–4.0)

## 2019-03-29 LAB — CBC WITH DIFFERENTIAL/PLATELET
Basophils Absolute: 0.1 10*3/uL (ref 0.0–0.2)
Basos: 1 %
EOS (ABSOLUTE): 0.4 10*3/uL (ref 0.0–0.4)
Eos: 5 %
Hematocrit: 44.8 % (ref 37.5–51.0)
Hemoglobin: 15 g/dL (ref 13.0–17.7)
Immature Grans (Abs): 0.1 10*3/uL (ref 0.0–0.1)
Immature Granulocytes: 1 %
Lymphocytes Absolute: 2.2 10*3/uL (ref 0.7–3.1)
Lymphs: 26 %
MCH: 27.8 pg (ref 26.6–33.0)
MCHC: 33.5 g/dL (ref 31.5–35.7)
MCV: 83 fL (ref 79–97)
Monocytes Absolute: 0.8 10*3/uL (ref 0.1–0.9)
Monocytes: 9 %
Neutrophils Absolute: 5.1 10*3/uL (ref 1.4–7.0)
Neutrophils: 58 %
Platelets: 200 10*3/uL (ref 150–450)
RBC: 5.39 x10E6/uL (ref 4.14–5.80)
RDW: 14.4 % (ref 11.6–15.4)
WBC: 8.6 10*3/uL (ref 3.4–10.8)

## 2019-03-29 LAB — HIV ANTIBODY (ROUTINE TESTING W REFLEX): HIV Screen 4th Generation wRfx: NONREACTIVE

## 2019-03-30 ENCOUNTER — Telehealth: Payer: Self-pay

## 2019-03-30 NOTE — Telephone Encounter (Signed)
Patient name and DOB has been verified Patient was informed of lab results. Patient had no questions.  

## 2019-03-30 NOTE — Telephone Encounter (Signed)
-----   Message from [name], MD sent at 03/29/2019 10:13 AM EST ----- Please inform him lab tests are normal

## 2019-04-07 DIAGNOSIS — M25571 Pain in right ankle and joints of right foot: Secondary | ICD-10-CM | POA: Diagnosis not present

## 2019-04-07 DIAGNOSIS — M542 Cervicalgia: Secondary | ICD-10-CM | POA: Diagnosis not present

## 2019-04-07 DIAGNOSIS — M545 Low back pain: Secondary | ICD-10-CM | POA: Diagnosis not present

## 2019-04-07 DIAGNOSIS — M5412 Radiculopathy, cervical region: Secondary | ICD-10-CM | POA: Diagnosis not present

## 2019-04-12 ENCOUNTER — Other Ambulatory Visit: Payer: Self-pay | Admitting: Family Medicine

## 2019-04-12 DIAGNOSIS — I10 Essential (primary) hypertension: Secondary | ICD-10-CM

## 2019-04-12 MED ORDER — LOSARTAN POTASSIUM 50 MG PO TABS: 50.0000 mg | ORAL_TABLET | Freq: Every day | ORAL | 1 refills | Status: DC

## 2019-04-20 ENCOUNTER — Other Ambulatory Visit: Payer: Self-pay

## 2019-04-20 ENCOUNTER — Ambulatory Visit: Payer: Medicaid Other | Admitting: Cardiovascular Disease

## 2019-04-20 ENCOUNTER — Encounter: Payer: Self-pay | Admitting: Cardiovascular Disease

## 2019-04-20 VITALS — BP 137/80 | HR 77 | Ht 74.0 in | Wt 355.6 lb

## 2019-04-20 DIAGNOSIS — E118 Type 2 diabetes mellitus with unspecified complications: Secondary | ICD-10-CM

## 2019-04-20 DIAGNOSIS — R600 Localized edema: Secondary | ICD-10-CM

## 2019-04-20 DIAGNOSIS — E785 Hyperlipidemia, unspecified: Secondary | ICD-10-CM | POA: Diagnosis not present

## 2019-04-20 DIAGNOSIS — I4819 Other persistent atrial fibrillation: Secondary | ICD-10-CM | POA: Diagnosis not present

## 2019-04-20 DIAGNOSIS — Z7901 Long term (current) use of anticoagulants: Secondary | ICD-10-CM

## 2019-04-20 DIAGNOSIS — I10 Essential (primary) hypertension: Secondary | ICD-10-CM | POA: Diagnosis not present

## 2019-04-20 DIAGNOSIS — R0683 Snoring: Secondary | ICD-10-CM

## 2019-04-20 DIAGNOSIS — Z794 Long term (current) use of insulin: Secondary | ICD-10-CM

## 2019-04-20 NOTE — Progress Notes (Signed)
Cardiology Office Note    Date:  04/21/2019   ID:  ADIT RIDDLES, DOB Jun 30, 1960, MRN [id number]  PCP:  [name], MD  Cardiologist:  Shelva Majestic, MD   No chief complaint on file.   History of Present Illness:  Jeffery [name] is a 59 y.o. male who is the son of my former patient Jeffery [name]. He established cardiology care with me in December 2018.  I had seen him for preoperative clearance in November 2020 with follow-up on March 13, 2019.  He presents for 1 month follow-up evaluation.   Jeffery [name] has a history of obesity, diabetes mellitus, and hypertension.  He previously had a significant EtOH history and was drinking up to 4 cases of beer per week.  In May after having significant amount of beer the night before he woke up in the morning feeling weak.  He presented and was found to be in atrial fibrillation of new onset.  He was placed on Lopressor and eliquis and underwent successful cardioversion.  His cha2ds2vasc score was 2.  He was seen in A. fib clinic in follow-up on 08/27/2016 and was in sinus rhythm with PACs.  An echo Doppler study showed an EF of 50-55% with grade 1 diastolic dysfunction.  His left atrium was moderately dilated.  There was trivial Jeffery.  He had done well maintaining sinus rhythm until August when again he had another alcohol binge and was back in atrial fibrillation.  He had been having difficulty with his back and had been off eliquis a week earlier to allow for treatment with ibuprofen.  He again was cardioverted for his A. fib with a rate of 169 on presentation and eliquis was resumed.  The patient has not had any alcohol since that presentation and quit smoking.    When I saw him in December 2018 he was maintaining sinus rhythm. He was having significant difficulty with his back. He has a Charcot foot and has difficulty walking. He denied any chest tightness or chest pressure.  He was unable to exercise due to his back discomfort. He had  run out of his metoprolol as pulses in the 90s with PACs.. I further titrated metoprolol to 75 mg twice a day, and he has continued to be on losartan 50 mg daily for HTN.   I saw him in March 2019 at which time he had noticed occasional palpitations.  He was unable to exercise due to his back and foot.  He quit tobacco and alcohol on November 01, 2016.  He saw Kerin Ransom following an ER evaluation  with complaints of dyspnea at rest.  He thought he had gone back into atrial fibrillation but in the emergency room he was in normal sinus rhythm.  Troponins were negative.  A close friend had recently had CABG revascularization.  He subsequently was referred for an nuclear perfusion study which was normal on November 11, 2017.  An echo Doppler study revealed normal EF at 55 to 60% with grade 1 diastolic dysfunction.  Peak PA pressure was minimally increased at 33 mm.  He has chronic pain syndrome and apparently the pain clinic prescribes Percocet.  He lives with his mother.  He is followed at [facility] by Dr. [name].  I saw him in October 2019 at which time he was remaining stable.  He continues to be morbidly obese and we discussed the importance of weight loss.  We also discussed evaluating him for sleep apnea.  He was unaware of any recurrent episodes of atrial fibrillation.  We discussed the importance of exercise and followed up laboratory.  I evaluated him on February 13, 2019 prior to undergoing rotator cuff surgery which was tentatively scheduled for the following week.  Over several weeks prior to that evaluation he had noticed increasing shortness of breath particularly with walking up steps.  He was unaware of any change in his rhythm.  He has not had any EtOH since November 01, 2016.  He also quit tobacco.  Over the past several months he is tried hard to improve his diet with reference to his diabetes and his hemoglobin A1c had reduced from 8.5 to 6.2.  During my evaluation, he was  found to be in atrial fibrillation of questionable duration.  He was unaware of being in the abnormal rhythm but when informed of being in atrial fibrillation he felt this correlated with his recent development of shortness of breath.  During that evaluation I recommended he defer his elective shoulder surgery and initiated amiodarone 200 mg twice a day.  He was continuing to take Eliquis 5 mg twice a day.  We further recommended that he either decrease the dose or discontinue his muscle spasm medication due to potential amiodarone interaction.  He underwent an echo Doppler study on February 28, 2019.  EF was 55 to 60% and there was moderate LVH.  He had mild dilation of his left atrium with moderate dilation of his right atrium.  There was mild Jeffery, mild TR.  He had moderately elevated pulmonary artery systolic pressure at 63.8 millimeters of mercury.  At the time of my initial evaluation we discussed the possibility of evaluating him for sleep apnea.  He does not want to go to the lab for study.  He admits that his sleep is suboptimal.  There is snoring.  He continues to note his heart rate irregularity.    When I saw him in follow-up evaluation on March 13, 2019, he was still in atrial fibrillation with an improved ventricular rate at 79 bpm.  He continues to be on metoprolol tartrate 100 mg twice a day.  Pressure was controlled also with losartan 50 mg daily.  He has continued to be on amiodarone 200 mg twice a day and returns today for follow-up evaluation.  He still notes irregularity to heart rate.  He has experienced swelling in his legs bilaterally.  In retrospect he believes he may have been in atrial fibrillation since October.  He presents for follow-up evaluation.  Past Medical History:  Diagnosis Date  . Atrial fibrillation ([place])   . Charcot's joint of right foot   . DDD (degenerative disc disease), lumbar   . Diabetes mellitus without complication ([place])   . Fatty liver   . Hypertension     . Obesity   . Rotator cuff disorder   . Shoulder impingement, right     Past Surgical History:  Procedure Laterality Date  . AMPUTATION Left 10/02/2014   Procedure: Left Third toe amputation ;  Surgeon: [name], MD;  Location: [facility];  Service: Orthopedics;  Laterality: Left;  Regular bed, wants to follow hip  . ANTERIOR CRUCIATE LIGAMENT REPAIR Right 90   reconstruction  . APPLICATION OF WOUND VAC Left 10/02/2014   Procedure: APPLICATION OF WOUND VAC; toe Surgeon: [name], MD;  Location: [facility];  Service: Orthopedics;  Laterality: Left;  . I & D EXTREMITY Left 10/05/2014   Procedure: IRRIGATION AND DEBRIDEMENT  LEFT FOOT;  Surgeon: [name], MD;  Location: [facility];  Service: Orthopedics;  Laterality: Left;  . KNEE ARTHROSCOPY W/ ACL RECONSTRUCTION Right   . TOTAL KNEE ARTHROPLASTY Right 03/28/2015  . TOTAL KNEE ARTHROPLASTY Right 03/28/2015   Procedure: RIGHT TOTAL KNEE ARTHROPLASTY;  Surgeon: [name], MD;  Location: [facility];  Service: Orthopedics;  Laterality: Right;    Current Medications: Outpatient Medications Prior to Visit  Medication Sig Dispense Refill  . Accu-Chek FastClix Lancets MISC Use as directed to check blood sugar at least twice daily. E11.8 E11.65 Z79.4 102 each 11  . amiodarone (PACERONE) 200 MG tablet Take 1 tablet (200 mg total) by mouth 2 (two) times daily. 60 tablet 3  . apixaban (ELIQUIS) 5 MG TABS tablet Take 1 tablet (5 mg total) by mouth 2 (two) times daily. 60 tablet 6  . atorvastatin (LIPITOR) 20 MG tablet Take 1 tablet (20 mg total) by mouth daily. 90 tablet 1  . Blood Glucose Monitoring Suppl (ACCU-CHEK AVIVA) device Use as instructed to check blood sugar 2 times daily. E11.8 E11.65 Z79.4 1 each 0  . gabapentin (NEURONTIN) 300 MG capsule Take 1 capsule (300 mg total) by mouth 2 (two) times daily. (Patient taking differently: Take 600 mg by mouth at bedtime. ) 180 capsule 1  . glucose blood (ACCU-CHEK AVIVA) test strip Use as instructed to check  blood sugar 2 times daily. E11.8 E11.65 Z79.4 100 each 12  . Insulin Glargine (LANTUS SOLOSTAR) 100 UNIT/ML Solostar Pen Inject 50 Units into the skin 2 (two) times daily. 30 mL 6  . insulin lispro (INSULIN LISPRO) 100 UNIT/ML KwikPen Junior Inject 0.08 mLs (8 Units total) into the skin 3 (three) times daily. (Patient taking differently: Inject 7 Units into the skin 3 (three) times daily. ) 30 mL 6  . Insulin Pen Needle (B-D ULTRAFINE III SHORT PEN) 31G X 8 MM MISC 1 each by Does not apply route 3 (three) times daily. 100 each 5  . Insulin Syringe-Needle U-100 (TRUEPLUS INSULIN SYRINGE) 30G X 5/16" 0.5 ML MISC Use as directed 3 times daily 100 each 5  . liraglutide (VICTOZA) 18 MG/3ML SOPN Inject 0.3 mLs (1.8 mg total) into the skin daily. 30 mL 6  . losartan (COZAAR) 50 MG tablet Take 1 tablet (50 mg total) by mouth daily. 90 tablet 1  . metoprolol tartrate (LOPRESSOR) 100 MG tablet Take 1 tablet (100 mg total) by mouth 2 (two) times daily. 180 tablet 1  . oxyCODONE-acetaminophen (PERCOCET) 10-325 MG tablet Take 1 tablet by mouth every 4 (four) hours as needed for pain.    . sodium chloride (OCEAN) 0.65 % SOLN nasal spray Place 1 spray into both nostrils at bedtime as needed for congestion.     Marland Kitchen testosterone cypionate (DEPOTESTOSTERONE CYPIONATE) 200 MG/ML injection Inject 1 mL (200 mg total) into the muscle every 28 (twenty-eight) days. (Patient not taking: Reported on 04/20/2019) 10 mL 3  . tiZANidine (ZANAFLEX) 4 MG tablet Take 1 tablet (4 mg total) by mouth at bedtime. (Patient not taking: Reported on 04/20/2019) 90 tablet 1  . TRUEPLUS INSULIN SYRINGE 30G X 5/16" 0.5 ML MISC USE AS DIRECTED 3 TIMES DAILY 100 each 0   No facility-administered medications prior to visit.     Allergies:   Metformin and related   Social History   Socioeconomic History  . Marital status: Single    Spouse name: Not on file  . Number of children: Not on file  . Years  of education: Not on file  . Highest  education level: Not on file  Occupational History  . Occupation: Management consultant  Tobacco Use  . Smoking status: Former Smoker    Packs/day: 0.00    Years: 38.00    Pack years: 0.00    Types: Cigarettes  . Smokeless tobacco: Never Used  Substance and Sexual Activity  . Alcohol use: No    Alcohol/week: 5.0 - 6.0 standard drinks    Types: 5 - 6 Cans of beer per week  . Drug use: Yes    Types: Marijuana    Comment: last week ; denies 03/24/17  . Sexual activity: Not on file  Other Topics Concern  . Not on file  Social History Narrative   Lives alone.   Social Determinants of Health   Financial Resource Strain:   . Difficulty of Paying Living Expenses: Not on file  Food Insecurity:   . Worried About Charity fundraiser in the Last Year: Not on file  . Ran Out of Food in the Last Year: Not on file  Transportation Needs:   . Lack of Transportation (Medical): Not on file  . Lack of Transportation (Non-Medical): Not on file  Physical Activity:   . Days of Exercise per Week: Not on file  . Minutes of Exercise per Session: Not on file  Stress:   . Feeling of Stress : Not on file  Social Connections:   . Frequency of Communication with Friends and Family: Not on file  . Frequency of Social Gatherings with Friends and Family: Not on file  . Attends Religious Services: Not on file  . Active Member of Clubs or Organizations: Not on file  . Attends Archivist Meetings: Not on file  . Marital Status: Not on file    Socially, never married.  He has no children.  He is currently living with his mother.  He is not working.  Previously had done jobs.  Family History:  The patient's family history includes Diabetes in his father; Heart failure in his father; Hypertension in his father.  His father died at age 74.  Mother is still living at age 22.  ROS General: Negative; No fevers, chills, or night sweats;  HEENT: Negative; No changes in vision or hearing,  sinus congestion, difficulty swallowing Pulmonary: Negative; No cough, wheezing, shortness of breath, hemoptysis Cardiovascular: See HPI GI: Negative; No nausea, vomiting, diarrhea, or abdominal pain GU: Negative; No dysuria, hematuria, or difficulty voiding Musculoskeletal: Chronic pain due to spinal stenosis and Charcot foot Hematologic/Oncology: Negative; no easy bruising, bleeding Endocrine: Negative; no heat/cold intolerance; no diabetes Neuro: Negative; no changes in balance, headaches Skin: Negative; No rashes or skin lesions Psychiatric: Negative; No behavioral problems, depression Sleep: Positive for snoring, daytime sleepiness, hypersomnolence, bruxism, restless legs, hypnogognic hallucinations, no cataplexy Other comprehensive 14 point system review is negative.   PHYSICAL EXAM:   BP 137/80   Pulse 77   Ht 6' 2"  (1.88 m)   Wt (!) 355 lb 9.6 oz (161.3 kg)   SpO2 99%   BMI 45.66 kg/m    Repeat blood pressure by me 132/78  Wt Readings from Last 3 Encounters:  04/20/19 (!) 355 lb 9.6 oz (161.3 kg)  03/28/19 (!) 350 lb (158.8 kg)  03/13/19 (!) 347 lb (157.4 kg)    General: Alert, oriented, no distress.  Morbidly obese with a BMI of 45.7 Skin: normal turgor, no rashes, warm and dry HEENT: Normocephalic, atraumatic. Pupils  equal round and reactive to light; sclera anicteric; extraocular muscles intact;  Nose without nasal septal hypertrophy Mouth/Parynx benign; Mallinpatti scale 3 Neck: Thick neck no JVD, no carotid bruits; normal carotid upstroke Lungs: clear to ausculatation and percussion; no wheezing or rales Chest wall: without tenderness to palpitation Heart: PMI not displaced, irregularly irregular with rate in the 70s, s1 s2 normal, 1/6 systolic murmur, no diastolic murmur, no rubs, gallops, thrills, or heaves Abdomen: soft, nontender; no hepatosplenomehaly, BS+; abdominal aorta nontender and not dilated by palpation. Back: no CVA tenderness Pulses  2+ Musculoskeletal: full range of motion, normal strength, no joint deformities Extremities: 1-2+ pretibial edema bilaterally; no clubbing cyanosis, Homan's sign negative  Neurologic: grossly nonfocal; Cranial nerves grossly wnl Psychologic: Normal mood and affect   Studies/Labs Reviewed:   EKG:  EKG is ordered today.  ECG (independently read by me): Atrial fibrillation at 77 bpm.  QTc interval 450 ms  March 13, 2019 ECG (independently read by me): Atrial fibrillation at 79 bpm.  Incomplete right bundle branch block.  QTc interval 449 ms.  February 13, 2019 ECG (independently read by me): Atrial fibrillation at 86 bpm; QTc 440 msec  October 2019 ECG (independently read by me): Sinus rhythm at 71 bpm.  No ectopy.  Normal intervals.  March 2019 ECG (independently read by me): normal sinus rhythm at 88 bpm. QTc interval 433 ms.  December 2018 ECG (independently read by me): Sinus rhythm in the 90s with PACs.  QTc interval 442 ms.  Recent Labs: BMP Latest Ref Rng & Units 02/13/2019 09/21/2018 10/24/2017  Glucose 65 - 99 mg/dL 167(H) 164(H) 277(H)  BUN 6 - 24 mg/dL 18 15 15   Creatinine 0.76 - 1.27 mg/dL 0.96 1.03 1.13  BUN/Creat Ratio 9 - 20 19 15  -  Sodium 134 - 144 mmol/L 138 137 134(L)  Potassium 3.5 - 5.2 mmol/L 5.0 4.4 4.6  Chloride 96 - 106 mmol/L 100 103 101  CO2 20 - 29 mmol/L 21 24 25   Calcium 8.7 - 10.2 mg/dL 9.7 9.1 9.4     Hepatic Function Latest Ref Rng & Units 02/13/2019 09/21/2018 04/15/2017  Total Protein 6.0 - 8.5 g/dL 7.6 6.7 CANCELED  Albumin 3.8 - 4.9 g/dL 4.5 4.1 CANCELED  AST 0 - 40 IU/L 19 12 CANCELED  ALT 0 - 44 IU/L 25 18 CANCELED  Alk Phosphatase 39 - 117 IU/L 102 76 CANCELED  Total Bilirubin 0.0 - 1.2 mg/dL 0.7 0.3 CANCELED    CBC Latest Ref Rng & Units 03/28/2019 02/13/2019 09/21/2018  WBC 3.4 - 10.8 x10E3/uL 8.6 11.8(H) 7.8  Hemoglobin 13.0 - 17.7 g/dL 15.0 16.1 14.1  Hematocrit 37.5 - 51.0 % 44.8 48.8 44.5  Platelets 150 - 450 x10E3/uL 200 250  197   Lab Results  Component Value Date   MCV 83 03/28/2019   MCV 83 02/13/2019   MCV 87 09/21/2018   Lab Results  Component Value Date   TSH 2.490 02/13/2019   Lab Results  Component Value Date   HGBA1C 7.6 (A) 03/28/2019     BNP No results found for: BNP  ProBNP No results found for: PROBNP   Lipid Panel     Component Value Date/Time   CHOL 157 04/04/2018 1103   TRIG 256 (H) 04/04/2018 1103   HDL 42 04/04/2018 1103   CHOLHDL 3.7 04/04/2018 1103   CHOLHDL 3.8 04/10/2010 0315   VLDL 32 04/10/2010 0315   LDLCALC 64 04/04/2018 1103     RADIOLOGY: No results found.  Additional studies/ records that were reviewed today include:  I reviewed the patient's atrial fibrillation clinic records as well as his ER visits.  I reviewed his most recent echo Doppler study and nuclear perfusion studies of August 2019  ------------------------------------------------------------------- 08/18/2016 ECHO Study Conclusions  - Left ventricle: The cavity size was mildly dilated. Wall   thickness was increased in a pattern of mild LVH. Systolic   function was normal. The estimated ejection fraction was in the   range of 50% to 55%. Wall motion was normal; there were no   regional wall motion abnormalities. Doppler parameters are   consistent with abnormal left ventricular relaxation (grade 1   diastolic dysfunction). - Aortic valve: Transvalvular velocity was within the normal range.   There was no stenosis. There was no regurgitation. - Mitral valve: Transvalvular velocity was within the normal range.   There was no evidence for stenosis. There was trivial   regurgitation. - Left atrium: The atrium was moderately dilated. - Right ventricle: The cavity size was normal. Wall thickness was   normal. Systolic function was normal. - Tricuspid valve: There was no regurgitation.  ------------------------------------------------------------------- August 2019  [facility]  Conclusions  - Left ventricle: The cavity size was moderately dilated. Wall   thickness was normal. Systolic function was normal. The estimated   ejection fraction was in the range of 55% to 60%. Wall motion was   normal; there were no regional wall motion abnormalities. Doppler   parameters are consistent with abnormal left ventricular   relaxation (grade 1 diastolic dysfunction). - Right ventricle: The cavity size was moderately dilated. Wall   thickness was normal. - Pulmonary arteries: Systolic pressure was mildly increased. PA   peak pressure: 33 mm Hg (S).   Gated SPECT myocardial perfusion study Highlights    Normal perfusion No ischemia or scar  LVEF is calculated at 48% Visual estimate suggests LVEF is greater Consider echo to confirm  The study is normal.  There was no ST segment deviation noted during stress.   ECHO 02/28/2019 IMPRESSIONS  1. Left ventricular ejection fraction, by visual estimation, is 55 to 60%. The left ventricle has normal function. There is moderately increased left ventricular hypertrophy.  2. Left ventricular diastolic function could not be evaluated.  3. Global right ventricle has mildly reduced systolic function.The right ventricular size is normal. No increase in right ventricular wall thickness.  4. Left atrial size was mildly dilated.  5. Right atrial size was moderately dilated.  6. The mitral valve is normal in structure. Mild mitral valve regurgitation. No evidence of mitral stenosis.  7. The tricuspid valve is normal in structure. Tricuspid valve regurgitation is mild.  8. The aortic valve is tricuspid. Aortic valve regurgitation is not visualized. No evidence of aortic valve sclerosis or stenosis.  9. The pulmonic valve was normal in structure. Pulmonic valve regurgitation is not visualized. 10. Moderately elevated pulmonary artery systolic pressure. 11. The atrial septum is grossly normal.  ASSESSMENT:    1. Persistent atrial  fibrillation ([place])   2. Anticoagulation adequate   3. Essential hypertension   4. Morbid obesity ([place])   5. Hyperlipidemia with target LDL less than 70   6. Snoring: Evaluate for OSA   7. Type 2 diabetes mellitus with complication, with long-term current use of insulin ([place])   8. Bilateral leg edema     PLAN:  Jeffery [name] is a 59 year old gentleman who has a long-standing history of obesity, and has been on diabetic medication since  2016.  In addition, has a history of hypertension and hyperlipidemia.  He previously had significant EtOH history and his 2 episodes of atrial fibrillation in 2018 occurred after significant alcohol binging.  He is not had any alcohol since his last cardioversion in August 2018 and also at that time quit tobacco.  An echo Doppler study in August 2019 continued to show normal systolic function with EF at 55 to 60%.  There was evidence for grade 1 diastolic dysfunction.  There was mild pulmonary hypertension with a PA systolic pressure 33 mm.  He has had significant improvement with his diabetes with recent hemoglobin A1c being reduced from 8.5 down to 6.2.  He has been on losartan 50 mg, metoprolol 100 mg twice a day for blood pressure control.  He was seen by me for preoperative clearance prior to undergoing elective shoulder surgery on February 13 2019.  At that time he was in atrial fibrillation.  Retrospectively he believes this may have occurred in October since for a month he had been noticing some exertional dyspnea.  He has been on amiodarone 200 mg twice a day since the November visit and has been on Eliquis anticoagulation.  ECG today confirms that he is still in atrial fibrillation.  He should now be fully loaded with amiodarone and has been on Eliquis for 2 months duration.  His most recent echo Doppler study has shown an EF of 55 to 60%.  His left atrium was mildly dilated and right atrium was moderately dilated.  He had pulmonary hypertension with  estimated PA pressure at 41.6 mm.  I will now schedule the patient for DC cardioversion.  Unfortunately my schedule next week does not allow for me to do this and I will schedule this to be done by Dr. Debara Pickett on Wednesday, 06/24/2019.  As I discussed previously, I have strongly recommended the patient undergo a sleep evaluation for my high index of suspicion that he has obstructive sleep apnea.  Recurrence rate of atrial fibrillation will be significantly increased if his sleep apnea is left untreated.  With a significant pretibial edema bilaterally I have recommended initiation of furosemide 40 mg daily.  He is diabetic on insulin, Victoza. I again discussed the importance of weight loss and exercise.  I will see him in 4 weeks for reevaluation.   Medication Adjustments/Labs and Tests Ordered: Current medicines are reviewed at length with the patient today.  Concerns regarding medicines are outlined above.  Medication changes, Labs and Tests ordered today are listed in the Patient Instructions below. Patient Instructions  Medication Instructions:  START- Lasix 40 mg by mouth daily  *If you need a refill on your cardiac medications before your next appointment, please call your pharmacy*  Lab Work: CBC and BMP If you have labs (blood work) drawn today and your tests are completely normal, you will receive your results only by: Marland Kitchen MyChart Message (if you have MyChart) OR . A paper copy in the mail If you have any lab test that is abnormal or we need to change your treatment, we will call you to review the results.  Testing/Procedures: Your physician has recommended that you have a Cardioversion (DCCV). Electrical Cardioversion uses a jolt of electricity to your heart either through paddles or wired patches attached to your chest. This is a controlled, usually prescheduled, procedure. Defibrillation is done under light anesthesia in the hospital, and you usually go home the day of the procedure. This  is done to get your heart  back into a normal rhythm. You are not awake for the procedure. Please see the instruction sheet given to you today.  Follow-Up: At [facility], you and your health needs are our priority.  As part of our continuing mission to provide you with exceptional heart care, we have created designated Provider Care Teams.  These Care Teams include your primary Cardiologist (physician) and Advanced Practice Providers (APPs -  Physician Assistants and Nurse Practitioners) who all work together to provide you with the care you need, when you need it.  Your next appointment:   After Cardioversion   Other Instructions Dear Jeffery [name] are scheduled for a Cardioversion on Wednesday January 27th with Dr. Debara Pickett.  Please arrive at the [facility] (Main Entrance A) at [address] [place] [facility] [address] [place], [place] [address] at 11:00 am. (1 hour prior to procedure unless lab work is needed; if lab work is needed arrive 1.5 hours ahead)  DIET: Nothing to eat or drink after midnight except a sip of water with medications (see medication instructions below)  Medication Instructions: Hold Lasix  Continue your anticoagulant: Eliquis You will need to continue your anticoagulant after your procedure until you  are told by your  Provider that it is safe to stop   Labs: CBC and BMP  Come to: [place]- This is a Drive Up Visit at the [facility] [address] [place]. Someone will direct you to the appropriate testing line. Stay in your car and someone will be with you shortly.   You must have a responsible person to drive you home and stay in the waiting area during your procedure. Failure to do so could result in cancellation.  Bring your insurance cards.  *Special Note: Every effort is made to have your procedure done on time. Occasionally there are emergencies that occur at the hospital that may cause delays. Please be patient  if a delay does occur.       Signed, Shelva Majestic, MD  04/21/2019 6:14 PM    [facility] [address] [place], [place], [place]  [address] Phone: [phone]

## 2019-04-20 NOTE — Patient Instructions (Addendum)
Medication Instructions:  START- Lasix 40 mg by mouth daily  *If you need a refill on your cardiac medications before your next appointment, please call your pharmacy*  Lab Work: CBC and BMP If you have labs (blood work) drawn today and your tests are completely normal, you will receive your results only by: Marland Kitchen MyChart Message (if you have MyChart) OR . A paper copy in the mail If you have any lab test that is abnormal or we need to change your treatment, we will call you to review the results.  Testing/Procedures: Your physician has recommended that you have a Cardioversion (DCCV). Electrical Cardioversion uses a jolt of electricity to your heart either through paddles or wired patches attached to your chest. This is a controlled, usually prescheduled, procedure. Defibrillation is done under light anesthesia in the hospital, and you usually go home the day of the procedure. This is done to get your heart back into a normal rhythm. You are not awake for the procedure. Please see the instruction sheet given to you today.  Follow-Up: At [facility], you and your health needs are our priority.  As part of our continuing mission to provide you with exceptional heart care, we have created designated Provider Care Teams.  These Care Teams include your primary Cardiologist (physician) and Advanced Practice Providers (APPs -  Physician Assistants and Nurse Practitioners) who all work together to provide you with the care you need, when you need it.  Your next appointment:   After Cardioversion   Other Instructions Dear Jeffery [name] are scheduled for a Cardioversion on Wednesday January 27th with Dr. Debara Pickett.  Please arrive at the [facility] (Main Entrance A) at [facility]: [address] [place], [place] [address] at 11:00 am. (1 hour prior to procedure unless lab work is needed; if lab work is needed arrive 1.5 hours ahead)  DIET: Nothing to eat or drink after midnight except a sip of  water with medications (see medication instructions below)  Medication Instructions: Hold Lasix  Continue your anticoagulant: Eliquis You will need to continue your anticoagulant after your procedure until you  are told by your  Provider that it is safe to stop   Labs: CBC and BMP  Come to: [place]- This is a Drive Up Visit at the [facility] [address] [place]. Someone will direct you to the appropriate testing line. Stay in your car and someone will be with you shortly.   You must have a responsible person to drive you home and stay in the waiting area during your procedure. Failure to do so could result in cancellation.  Bring your insurance cards.  *Special Note: Every effort is made to have your procedure done on time. Occasionally there are emergencies that occur at the hospital that may cause delays. Please be patient if a delay does occur.

## 2019-04-21 ENCOUNTER — Telehealth: Payer: Self-pay | Admitting: Cardiovascular Disease

## 2019-04-21 ENCOUNTER — Encounter: Payer: Self-pay | Admitting: Cardiovascular Disease

## 2019-04-21 ENCOUNTER — Inpatient Hospital Stay (HOSPITAL_COMMUNITY): Admission: RE | Admit: 2019-04-21 | Payer: Medicaid Other | Source: Ambulatory Visit

## 2019-04-21 DIAGNOSIS — I480 Paroxysmal atrial fibrillation: Secondary | ICD-10-CM | POA: Diagnosis not present

## 2019-04-21 MED ORDER — FUROSEMIDE 40 MG PO TABS: 40.0000 mg | ORAL_TABLET | Freq: Every day | ORAL | 3 refills | Status: DC

## 2019-04-21 NOTE — Telephone Encounter (Signed)
Per Dr. Evette Georges note, Pt was to start furosemide 40 mg daily.  Entered order.  Advised to get labs today at [facility].  Pt will go for labs and pick up lasix afterwards.  Confirmed Pt going for covid test tomorrow.  All questions answered.

## 2019-04-21 NOTE — Telephone Encounter (Signed)
New Message  Pt called and wanted to ask a nurse about the lab work he is supposed to take.   Please call to discuss

## 2019-04-22 ENCOUNTER — Other Ambulatory Visit (HOSPITAL_COMMUNITY)
Admission: RE | Admit: 2019-04-22 | Discharge: 2019-04-22 | Disposition: A | Discharge status: Home / Self Care | Payer: Medicaid Other | Source: Ambulatory Visit | Attending: Internal Medicine | Admitting: Internal Medicine

## 2019-04-22 ENCOUNTER — Other Ambulatory Visit (HOSPITAL_COMMUNITY): Payer: Medicaid Other

## 2019-04-22 DIAGNOSIS — Z20822 Contact with and (suspected) exposure to covid-19: Secondary | ICD-10-CM | POA: Diagnosis not present

## 2019-04-22 DIAGNOSIS — Z01812 Encounter for preprocedural laboratory examination: Secondary | ICD-10-CM | POA: Insufficient documentation

## 2019-04-22 LAB — BASIC METABOLIC PANEL
BUN/Creatinine Ratio: 18 (ref 9–20)
BUN: 17 mg/dL (ref 6–24)
CO2: 24 mmol/L (ref 20–29)
Calcium: 9.8 mg/dL (ref 8.7–10.2)
Chloride: 101 mmol/L (ref 96–106)
Creatinine, Ser: 0.94 mg/dL (ref 0.76–1.27)
GFR calc Af Amer: 104 mL/min/{1.73_m2} (ref >59)
GFR calc non Af Amer: 90 mL/min/{1.73_m2} (ref >59)
Glucose: 225 mg/dL — ABNORMAL HIGH (ref 65–99)
Potassium: 4.9 mmol/L (ref 3.5–5.2)
Sodium: 140 mmol/L (ref 134–144)

## 2019-04-22 LAB — CBC
Hematocrit: 44 % (ref 37.5–51.0)
Hemoglobin: 14.8 g/dL (ref 13.0–17.7)
MCH: 28.1 pg (ref 26.6–33.0)
MCHC: 33.6 g/dL (ref 31.5–35.7)
MCV: 84 fL (ref 79–97)
Platelets: 209 10*3/uL (ref 150–450)
RBC: 5.26 x10E6/uL (ref 4.14–5.80)
RDW: 14.6 % (ref 11.6–15.4)
WBC: 9.9 10*3/uL (ref 3.4–10.8)

## 2019-04-22 LAB — SARS CORONAVIRUS 2 (TAT 6-24 HRS): SARS Coronavirus 2: NEGATIVE

## 2019-04-26 ENCOUNTER — Encounter (HOSPITAL_COMMUNITY): Payer: Self-pay | Admitting: Internal Medicine

## 2019-04-26 ENCOUNTER — Ambulatory Visit (HOSPITAL_COMMUNITY): Payer: Medicaid Other | Admitting: Critical Care Medicine

## 2019-04-26 ENCOUNTER — Encounter (HOSPITAL_COMMUNITY)
Admission: RE | Disposition: A | Discharge status: Home / Self Care | Payer: Self-pay | Source: Home / Self Care | Attending: Internal Medicine

## 2019-04-26 ENCOUNTER — Ambulatory Visit (HOSPITAL_COMMUNITY)
Admission: RE | Admit: 2019-04-26 | Discharge: 2019-04-26 | Disposition: A | Discharge status: Home / Self Care | Payer: Medicaid Other | Attending: Internal Medicine | Admitting: Internal Medicine

## 2019-04-26 ENCOUNTER — Other Ambulatory Visit: Payer: Self-pay

## 2019-04-26 DIAGNOSIS — I10 Essential (primary) hypertension: Secondary | ICD-10-CM | POA: Insufficient documentation

## 2019-04-26 DIAGNOSIS — K760 Fatty (change of) liver, not elsewhere classified: Secondary | ICD-10-CM | POA: Insufficient documentation

## 2019-04-26 DIAGNOSIS — I480 Paroxysmal atrial fibrillation: Secondary | ICD-10-CM | POA: Diagnosis not present

## 2019-04-26 DIAGNOSIS — Z6841 Body Mass Index (BMI) 40.0 and over, adult: Secondary | ICD-10-CM | POA: Diagnosis not present

## 2019-04-26 DIAGNOSIS — Z833 Family history of diabetes mellitus: Secondary | ICD-10-CM | POA: Diagnosis not present

## 2019-04-26 DIAGNOSIS — E785 Hyperlipidemia, unspecified: Secondary | ICD-10-CM | POA: Insufficient documentation

## 2019-04-26 DIAGNOSIS — Z89422 Acquired absence of other left toe(s): Secondary | ICD-10-CM | POA: Diagnosis not present

## 2019-04-26 DIAGNOSIS — Z888 Allergy status to other drugs, medicaments and biological substances status: Secondary | ICD-10-CM | POA: Insufficient documentation

## 2019-04-26 DIAGNOSIS — E1142 Type 2 diabetes mellitus with diabetic polyneuropathy: Secondary | ICD-10-CM | POA: Diagnosis not present

## 2019-04-26 DIAGNOSIS — Z79899 Other long term (current) drug therapy: Secondary | ICD-10-CM | POA: Diagnosis not present

## 2019-04-26 DIAGNOSIS — Z8249 Family history of ischemic heart disease and other diseases of the circulatory system: Secondary | ICD-10-CM | POA: Diagnosis not present

## 2019-04-26 DIAGNOSIS — Z87891 Personal history of nicotine dependence: Secondary | ICD-10-CM | POA: Insufficient documentation

## 2019-04-26 DIAGNOSIS — E11618 Type 2 diabetes mellitus with other diabetic arthropathy: Secondary | ICD-10-CM | POA: Diagnosis not present

## 2019-04-26 DIAGNOSIS — Z794 Long term (current) use of insulin: Secondary | ICD-10-CM | POA: Insufficient documentation

## 2019-04-26 DIAGNOSIS — R0683 Snoring: Secondary | ICD-10-CM | POA: Insufficient documentation

## 2019-04-26 DIAGNOSIS — R600 Localized edema: Secondary | ICD-10-CM | POA: Insufficient documentation

## 2019-04-26 DIAGNOSIS — I4819 Other persistent atrial fibrillation: Secondary | ICD-10-CM | POA: Diagnosis not present

## 2019-04-26 DIAGNOSIS — Z7901 Long term (current) use of anticoagulants: Secondary | ICD-10-CM | POA: Insufficient documentation

## 2019-04-26 DIAGNOSIS — E1169 Type 2 diabetes mellitus with other specified complication: Secondary | ICD-10-CM | POA: Diagnosis not present

## 2019-04-26 HISTORY — PX: CARDIOVERSION: SHX1299

## 2019-04-26 LAB — GLUCOSE, CAPILLARY: Glucose-Capillary: 142 mg/dL — ABNORMAL HIGH (ref 70–99)

## 2019-04-26 SURGERY — CARDIOVERSION
Anesthesia: General

## 2019-04-26 MED ORDER — PROPOFOL 10 MG/ML IV BOLUS
INTRAVENOUS | Status: DC | PRN
  Administered 2019-04-26: 40 ug via INTRAVENOUS
  Administered 2019-04-26: 60 ug via INTRAVENOUS
  Administered 2019-04-26: 40 ug via INTRAVENOUS
  Administered 2019-04-26 (×2): 30 ug via INTRAVENOUS

## 2019-04-26 MED ORDER — SODIUM CHLORIDE 0.9 % IV SOLN: INTRAVENOUS | Status: DC

## 2019-04-26 NOTE — CV Procedure (Signed)
   CARDIOVERSION NOTE  Procedure: Electrical Cardioversion Indications:  Atrial Fibrillation  Procedure Details:  Consent: Risks of procedure as well as the alternatives and risks of each were explained to the (patient/caregiver).  Consent for procedure obtained.  Time Out: Verified patient identification, verified procedure, site/side was marked, verified correct patient position, special equipment/implants available, medications/allergies/relevent history reviewed, required imaging and test results available.  Performed  Patient placed on cardiac monitor, pulse oximetry, supplemental oxygen as necessary.  Sedation given: propofol per anesthesia Pacer pads placed anterior and posterior chest.  Cardioverted 3 time(s).  Cardioverted at 200J biphasic x 3.  Impression: Findings: Post procedure EKG shows: Atrial Fibrillation Complications: None Patient did tolerate procedure well.  Plan: 1. Unsuccessful DCCV despite 3 max energy sequential shocks.  Time Spent Directly with the Patient:  30 minutes   Jeffery Casino, MD, [facility], [place] Director of the Advanced Lipid Disorders &  Cardiovascular Risk Reduction Clinic Diplomate of the American Board of Clinical Lipidology Attending Cardiologist  Direct Dial: [phone]  Fax: [phone]  Website:  [email or web address] 04/26/2019, 11:01 AM

## 2019-04-26 NOTE — Transfer of Care (Signed)
Immediate Anesthesia Transfer of Care Note  Patient: Jeffery [name]  Procedure(s) Performed: CARDIOVERSION (N/A )  Patient Location: Endoscopy Unit  Anesthesia Type:General  Level of Consciousness: awake, alert  and oriented  Airway & Oxygen Therapy: Patient Spontanous Breathing and Patient connected to nasal cannula oxygen  Post-op Assessment: Report given to RN and Post -op Vital signs reviewed and stable  Post vital signs: Reviewed and stable  Last Vitals:  Vitals Value Taken Time  BP    Temp    Pulse    Resp    SpO2      Last Pain:  Vitals:   04/26/19 1020  PainSc: 0-No pain         Complications: No apparent anesthesia complications

## 2019-04-26 NOTE — Discharge Instructions (Signed)
Electrical Cardioversion Electrical cardioversion is the delivery of a jolt of electricity to restore a normal rhythm to the heart. A rhythm that is too fast or is not regular keeps the heart from pumping well. In this procedure, sticky patches or metal paddles are placed on the chest to deliver electricity to the heart from a device. This procedure may be done in an emergency if:  There is low or no blood pressure as a result of the heart rhythm.  Normal rhythm must be restored as fast as possible to protect the brain and heart from further damage.  It may save a life. This may also be a scheduled procedure for irregular or fast heart rhythms that are not immediately life-threatening. Tell a health care provider about:  Any allergies you have.  All medicines you are taking, including vitamins, herbs, eye drops, creams, and over-the-counter medicines.  Any problems you or family members have had with anesthetic medicines.  Any blood disorders you have.  Any surgeries you have had.  Any medical conditions you have.  Whether you are pregnant or may be pregnant. What are the risks? Generally, this is a safe procedure. However, problems may occur, including:  Allergic reactions to medicines.  A blood clot that breaks free and travels to other parts of your body.  The possible return of an abnormal heart rhythm within hours or days after the procedure.  Your heart stopping (cardiac arrest). This is rare. What happens before the procedure? Medicines  Your health care provider may have you start taking: ? Blood-thinning medicines (anticoagulants) so your blood does not clot as easily. ? Medicines to help stabilize your heart rate and rhythm.  Ask your health care provider about: ? Changing or stopping your regular medicines. This is especially important if you are taking diabetes medicines or blood thinners. ? Taking medicines such as aspirin and ibuprofen. These medicines can  thin your blood. Do not take these medicines unless your health care provider tells you to take them. ? Taking over-the-counter medicines, vitamins, herbs, and supplements. General instructions  Follow instructions from your health care provider about eating or drinking restrictions.  Plan to have someone take you home from the hospital or clinic.  If you will be going home right after the procedure, plan to have someone with you for 24 hours.  Ask your health care provider what steps will be taken to help prevent infection. These may include washing your skin with a germ-killing soap. What happens during the procedure?   An IV will be inserted into one of your veins.  Sticky patches (electrodes) or metal paddles may be placed on your chest.  You will be given a medicine to help you relax (sedative).  An electrical shock will be delivered. The procedure may vary among health care providers and hospitals. What can I expect after the procedure?  Your blood pressure, heart rate, breathing rate, and blood oxygen level will be monitored until you leave the hospital or clinic.  Your heart rhythm will be watched to make sure it does not change.  You may have some redness on the skin where the shocks were given. Follow these instructions at home:  Do not drive for 24 hours if you were given a sedative during your procedure.  Take over-the-counter and prescription medicines only as told by your health care provider.  Ask your health care provider how to check your pulse. Check it often.  Rest for 48 hours after the procedure or   as told by your health care provider.  Avoid or limit your caffeine use as told by your health care provider.  Keep all follow-up visits as told by your health care provider. This is important. Contact a health care provider if:  You feel like your heart is beating too quickly or your pulse is not regular.  You have a serious muscle cramp that does not go  away. Get help right away if:  You have discomfort in your chest.  You are dizzy or you feel faint.  You have trouble breathing or you are short of breath.  Your speech is slurred.  You have trouble moving an arm or leg on one side of your body.  Your fingers or toes turn cold or blue. Summary  Electrical cardioversion is the delivery of a jolt of electricity to restore a normal rhythm to the heart.  This procedure may be done right away in an emergency or may be a scheduled procedure if the condition is not an emergency.  Generally, this is a safe procedure.  After the procedure, check your pulse often as told by your health care provider. This information is not intended to replace advice given to you by your health care provider. Make sure you discuss any questions you have with your health care provider. Document Revised: 10/17/2018 Document Reviewed: 10/17/2018 Elsevier Patient Education  2020 [employer]  

## 2019-04-26 NOTE — H&P (Signed)
    INTERVAL PROCEDURE H&P  History and Physical Interval Note:  04/26/2019 9:54 AM  Jeffery [name] has presented today for their planned procedure. The various methods of treatment have been discussed with the patient and family. After consideration of risks, benefits and other options for treatment, the patient has consented to the procedure.  The patients' outpatient history has been reviewed, patient examined, and no change in status from most recent office note within the past 30 days. I have reviewed the patients' chart and labs and will proceed as planned. Questions were answered to the patient's satisfaction.   Pixie Casino, MD, [facility], [place] Director of the Advanced Lipid Disorders &  Cardiovascular Risk Reduction Clinic Diplomate of the American Board of Clinical Lipidology Attending Cardiologist  Direct Dial: [phone]  Fax: [phone]  Website:  [name] 04/26/2019, 9:54 AM

## 2019-04-27 ENCOUNTER — Encounter: Payer: Self-pay | Admitting: *Deleted

## 2019-04-28 ENCOUNTER — Telehealth: Payer: Self-pay | Admitting: Cardiovascular Disease

## 2019-04-28 NOTE — Telephone Encounter (Signed)
Patient is calling because Cardioversion procedure completed on 04/26/19 did not take. Patient states that he is seeking medical advice regarding recommendations for afib moving forward. Please call and advise.

## 2019-04-28 NOTE — Telephone Encounter (Signed)
Called patient, he would like to see someone to talk about his other options.  Made appointment with NP.

## 2019-05-01 NOTE — Progress Notes (Signed)
Right mild  Cardiology Clinic Note   Patient Name: Jeffery [name] Date of Encounter: 05/02/2019  Primary Care Provider:  [name], MD Primary Cardiologist:  Shelva Majestic, MD  Patient Profile    Jeffery [name] 59 year old male presents today for follow-up of his atrial fibrillation.  Past Medical History    Past Medical History:  Diagnosis Date  . Atrial fibrillation ([place])   . Charcot's joint of right foot   . DDD (degenerative disc disease), lumbar   . Diabetes mellitus without complication ([place])   . Fatty liver   . Hypertension   . Obesity   . Rotator cuff disorder   . Shoulder impingement, right    Past Surgical History:  Procedure Laterality Date  . AMPUTATION Left 10/02/2014   Procedure: Left Third toe amputation ;  Surgeon: [name], MD;  Location: [facility];  Service: Orthopedics;  Laterality: Left;  Regular bed, wants to follow hip  . ANTERIOR CRUCIATE LIGAMENT REPAIR Right 90   reconstruction  . APPLICATION OF WOUND VAC Left 10/02/2014   Procedure: APPLICATION OF WOUND VAC; toe Surgeon: [name], MD;  Location: [place];  Service: Orthopedics;  Laterality: Left;  . CARDIOVERSION N/A 04/26/2019   Procedure: CARDIOVERSION;  Surgeon: Pixie Casino, MD;  Location: [facility];  Service: Cardiovascular;  Laterality: N/A;  . I & D EXTREMITY Left 10/05/2014   Procedure: IRRIGATION AND DEBRIDEMENT LEFT FOOT;  Surgeon: [name], MD;  Location: [facility];  Service: Orthopedics;  Laterality: Left;  . KNEE ARTHROSCOPY W/ ACL RECONSTRUCTION Right   . TOTAL KNEE ARTHROPLASTY Right 03/28/2015  . TOTAL KNEE ARTHROPLASTY Right 03/28/2015   Procedure: RIGHT TOTAL KNEE ARTHROPLASTY;  Surgeon: [name], MD;  Location: [facility];  Service: Orthopedics;  Laterality: Right;    Allergies  Allergies  Allergen Reactions  . Metformin And Related Other (See Comments)    GI upset    History of Present Illness  Jeffery [name] has a past medical history of obesity, diabetes,  hypertension, and atrial fibrillation.  He has had a significant history of EtOH abuse and was drinking up to 4 cases of beer per week.  He has had an extensive history of unsuccessful cardioversions.  When he was initially seen in December 2018 he had consumed a significant amount of beer and woke up feeling weak.  He was found to be in atrial fibrillation and was placed on Lopressor and Eliquis.  At that time he underwent a DCCV which was successful.  His CHA2DS2-VASc score was 2.  He then followed up with atrial fibrillation clinic 08/27/2016 and maintained sinus rhythm with PACs.  His echocardiogram showed an EF of 50-55% with grade 1 diastolic dysfunction.  His left atrium was moderately dilated and trivial MR was noted.  In August 2019 he had another episode of binge drinking which placed him back in atrial fibrillation.  He has been having difficulty with his back and has been off his Eliquis to allow for treatment with ibuprofen.  He was again cardioverted for his A. fib with a rate of 169 bpm and his Eliquis was resumed.  He was seen by Kerin Ransom, PA-C following an ER evaluation with complaints of dyspnea at rest.  His ischemic evaluation was negative.  An echocardiogram showed an EF of 55 to 60% with grade 1 diastolic dysfunction.  PA pressures minimally increased to 33 mmHg.  She is known to have chronic pain syndrome and apparently at the pain  clinic prescribed Percocet for him.  He lives with his mother at bedtime and was followed by the [facility] wellness clinic.   He was seen by Dr. Claiborne Billings 12/2017 and remained stable at that time.  Weight loss was discussed as well as evaluation for sleep apnea.  At that time he was unaware of recurrent episodes of A. fib.  The importance of exercise was also discussed.  When he was seen in 01/2019 for preoperative cardiac evaluation he was noted to have increased work of breathing.  He was unaware of the change in his rhythm.  He stated he had not had any EtOH  since August 2018.  He had also quit tobacco.  He was also in the process of working on his diet to lower his A1c.  His A1c has reduced from 8.5-6.2.  He was found to be in atrial fibrillation.  It was recommended the he defer his elective shoulder surgery, initiate amiodarone 200 mg twice daily, and continue to take his Eliquis.  His medications were reviewed and it was found that amiodarone may have an interaction with his muscle spasm medication.  It was recommended that he discontinue the use of his muscle relaxer at that time.  He had an echocardiogram 02/28/2019 which showed an EF of 55 to 60% with moderate LVH.  He had mild dilation of the left atrium and moderate dilation of his right atrium, mild mitral regurgitation and mild tricuspid regurgitation were noted.  He had moderately elevated pulmonary arterial systolic pressure 06.2 mmHg.  Sleep evaluation was also discussed at the time.  He did not want to go to the lab for the study.  He admitted at that time that he had difficulty sleeping.  He stated he did have snoring and at that time he was noticing irregular heartbeats.  He was seen on 03/13/2019 and still in atrial fibrillation.  However, his rate had improved to 79 bpm.  He continued his metoprolol tartrate 100 mg twice daily.  Blood pressure was well controlled on losartan 50 mg daily.  He continued his amiodarone 200 mg twice daily.  He still noted irregularities with his heartbeat.  His lower extremities were noted to be edematous.  He stated thinking back he may have been in atrial fibrillation since October 2020.  He presented for DCCV on 04/26/2019 and received 200 J biphasic x3.  His DCCV was unsuccessful.  He presents to the clinic today for follow-up and states his breathing is not returned to normal.  He believes that the 40 mg daily Lasix has helped somewhat and he now has less lower extremity edema.  He states he continues to abstain from alcohol and tobacco use.  Last time he used  either was August 2018.  He has also modified his diet and lost 8 pounds since his last visit in January pre unsuccessful DCCV.  He is limited in the amount of physical activity continue due to his charcot foot, back and shoulder pain.  Right him get regular he states he has continued his Eliquis and amiodarone as well and has not missed any doses.    He denies chest pain, increased shortness of breath, increased lower extremity edema, fatigue, palpitations, melena, hematuria, hemoptysis, diaphoresis, weakness, presyncope, syncope, orthopnea, and PND.    Home Medications    Prior to Admission medications   Medication Sig Start Date End Date Taking? Authorizing Provider  Accu-Chek FastClix Lancets MISC Use as directed to check blood sugar at least twice daily. E11.8  E11.65 Z79.4 10/18/18   [name], MD  amiodarone (PACERONE) 200 MG tablet Take 1 tablet (200 mg total) by mouth 2 (two) times daily. 02/13/19   Troy Sine, MD  apixaban (ELIQUIS) 5 MG TABS tablet Take 1 tablet (5 mg total) by mouth 2 (two) times daily. 03/28/19   [name], MD  atorvastatin (LIPITOR) 20 MG tablet Take 1 tablet (20 mg total) by mouth daily. 03/28/19   [name], MD  Blood Glucose Monitoring Suppl (ACCU-CHEK AVIVA) device Use as instructed to check blood sugar 2 times daily. E11.8 E11.65 Z79.4 10/18/18 10/18/19  [name], MD  furosemide (LASIX) 40 MG tablet Take 1 tablet (40 mg total) by mouth daily. 04/21/19 07/20/19  Troy Sine, MD  gabapentin (NEURONTIN) 300 MG capsule Take 1 capsule (300 mg total) by mouth 2 (two) times daily. Patient taking differently: Take 600 mg by mouth at bedtime.  03/28/19   [name], MD  glucose blood (ACCU-CHEK AVIVA) test strip Use as instructed to check blood sugar 2 times daily. E11.8 E11.65 Z79.4 10/18/18   [name], MD  Insulin Glargine (LANTUS SOLOSTAR) 100 UNIT/ML Solostar Pen Inject 50 Units into the skin 2 (two) times daily. 03/28/19    [name], MD  insulin lispro (INSULIN LISPRO) 100 UNIT/ML KwikPen Junior Inject 0.08 mLs (8 Units total) into the skin 3 (three) times daily. Patient taking differently: Inject 7 Units into the skin 3 (three) times daily.  03/28/19   [name], MD  Insulin Pen Needle (B-D ULTRAFINE III SHORT PEN) 31G X 8 MM MISC 1 each by Does not apply route 3 (three) times daily. 10/12/17   [name], MD  Insulin Syringe-Needle U-100 (TRUEPLUS INSULIN SYRINGE) 30G X 5/16" 0.5 ML MISC Use as directed 3 times daily 10/06/17   [name], MD  liraglutide (VICTOZA) 18 MG/3ML SOPN Inject 0.3 mLs (1.8 mg total) into the skin daily. 03/28/19   [name], MD  losartan (COZAAR) 50 MG tablet Take 1 tablet (50 mg total) by mouth daily. 04/12/19   [name], MD  metoprolol tartrate (LOPRESSOR) 100 MG tablet Take 1 tablet (100 mg total) by mouth 2 (two) times daily. 03/28/19   [name], MD  oxyCODONE-acetaminophen (PERCOCET) 10-325 MG tablet Take 1 tablet by mouth every 4 (four) hours as needed for pain.    [provider]  sodium chloride (OCEAN) 0.65 % SOLN nasal spray Place 1 spray into both nostrils at bedtime as needed for congestion.     [provider]  testosterone cypionate (DEPOTESTOSTERONE CYPIONATE) 200 MG/ML injection Inject 1 mL (200 mg total) into the muscle every 28 (twenty-eight) days. Patient not taking: Reported on 04/20/2019 03/16/18   [name], MD  tiZANidine (ZANAFLEX) 2 MG tablet Take 2 mg by mouth at bedtime as needed for muscle spasms.    [provider]  tiZANidine (ZANAFLEX) 4 MG tablet Take 1 tablet (4 mg total) by mouth at bedtime. Patient not taking: Reported on 04/20/2019 09/21/18   [name], MD  TRUEPLUS INSULIN SYRINGE 30G X 5/16" 0.5 ML MISC USE AS DIRECTED 3 TIMES DAILY 04/03/16   Tresa Garter, MD    Family History    Family History  Problem Relation Age of Onset  . Diabetes Father   . Hypertension  Father   . Heart failure Father    He indicated that his mother is alive. He indicated that his father is deceased.  Social History    Social History  Socioeconomic History  . Marital status: Single    Spouse name: Not on file  . Number of children: Not on file  . Years of education: Not on file  . Highest education level: Not on file  Occupational History  . Occupation: Management consultant  Tobacco Use  . Smoking status: Former Smoker    Packs/day: 0.00    Years: 38.00    Pack years: 0.00    Types: Cigarettes  . Smokeless tobacco: Never Used  Substance and Sexual Activity  . Alcohol use: No    Alcohol/week: 5.0 - 6.0 standard drinks    Types: 5 - 6 Cans of beer per week  . Drug use: Yes    Types: Marijuana    Comment: last week ; denies 03/24/17  . Sexual activity: Not on file  Other Topics Concern  . Not on file  Social History Narrative   Lives alone.   Social Determinants of Health   Financial Resource Strain:   . Difficulty of Paying Living Expenses: Not on file  Food Insecurity:   . Worried About Charity fundraiser in the Last Year: Not on file  . Ran Out of Food in the Last Year: Not on file  Transportation Needs:   . Lack of Transportation (Medical): Not on file  . Lack of Transportation (Non-Medical): Not on file  Physical Activity:   . Days of Exercise per Week: Not on file  . Minutes of Exercise per Session: Not on file  Stress:   . Feeling of Stress : Not on file  Social Connections:   . Frequency of Communication with Friends and Family: Not on file  . Frequency of Social Gatherings with Friends and Family: Not on file  . Attends Religious Services: Not on file  . Active Member of Clubs or Organizations: Not on file  . Attends Archivist Meetings: Not on file  . Marital Status: Not on file  Intimate Partner Violence:   . Fear of Current or Ex-Partner: Not on file  . Emotionally Abused: Not on file  . Physically Abused:  Not on file  . Sexually Abused: Not on file     Review of Systems    General:  No chills, fever, night sweats or weight changes.  Cardiovascular:  No chest pain, dyspnea on exertion, edema, orthopnea, palpitations, paroxysmal nocturnal dyspnea. Dermatological: No rash, lesions/masses Respiratory: No cough, dyspnea Urologic: No hematuria, dysuria Abdominal:   No nausea, vomiting, diarrhea, bright red blood per rectum, melena, or hematemesis Neurologic:  No visual changes, wkns, changes in mental status. All other systems reviewed and are otherwise negative except as noted above.  Physical Exam    VS:  BP 116/70   Pulse 71   Temp (!) 96.6 F (35.9 C)   Ht 6\' 2"  (1.88 m)   Wt (!) 347 lb 6.4 oz (157.6 kg)   SpO2 98%   BMI 44.60 kg/m  , BMI Body mass index is 44.6 kg/m. GEN: Well nourished, well developed, in no acute distress. HEENT: normal. Neck: Supple, no JVD, carotid bruits, or masses. Cardiac: Irregularly irregular, no murmurs, rubs, or gallops. No clubbing, cyanosis, edema.  Radials/DP/PT 2+ and equal bilaterally.  Respiratory:  Respirations regular and unlabored, clear to auscultation bilaterally. GI: Soft, nontender, nondistended, BS + x 4. MS: no deformity or atrophy. Skin: warm and dry, no rash. Neuro:  Strength and sensation are intact. Psych: Normal affect.  Accessory Clinical Findings    ECG personally  reviewed by me today-atrial fibrillation 65 bpm- No acute changes  EKG 04/20/2019 Atrial fibrillation 77 bpm  Echocardiogram 02/28/2019 IMPRESSIONS    1. Left ventricular ejection fraction, by visual estimation, is 55 to  60%. The left ventricle has normal function. There is moderately increased  left ventricular hypertrophy.  2. Left ventricular diastolic function could not be evaluated.  3. Global right ventricle has mildly reduced systolic function.The right  ventricular size is normal. No increase in right ventricular wall  thickness.  4. Left  atrial size was mildly dilated.  5. Right atrial size was moderately dilated.  6. The mitral valve is normal in structure. Mild mitral valve  regurgitation. No evidence of mitral stenosis.  7. The tricuspid valve is normal in structure. Tricuspid valve  regurgitation is mild.  8. The aortic valve is tricuspid. Aortic valve regurgitation is not  visualized. No evidence of aortic valve sclerosis or stenosis.  9. The pulmonic valve was normal in structure. Pulmonic valve  regurgitation is not visualized.  10. Moderately elevated pulmonary artery systolic pressure.  11. The atrial septum is grossly normal.  Assessment & Plan   1.  Atrial fibrillation-04/26/2019 unsuccessful DCCV with shock x3. Continue amiodarone 200 mg twice daily Continue metoprolol 100 mg twice daily Continue apixaban 5 mg twice daily Continue furosemide 40 mg daily Avoid triggers caffeine, chocolate, EtOH, etc. Refer to EP  Essential hypertension-BP today 116/70 Continue losartan 50 mg daily Continue metoprolol tartrate 100 mg twice daily Heart healthy low-sodium diet-salty 6 given Increase physical activity as tolerated  Hyperlipidemia-LDL 64 04/04/2018 Continue atorvastatin 20 mg daily Heart healthy low-sodium high-fiber diet Increase physical activity as tolerated  Disposition: Follow-up with Dr. Claiborne Billings after EP visit.   Jossie Ng. [facility] [place] [address] Office [phone] Fax [phone]

## 2019-05-02 ENCOUNTER — Ambulatory Visit (INDEPENDENT_AMBULATORY_CARE_PROVIDER_SITE_OTHER): Payer: Medicaid Other | Admitting: General Practice

## 2019-05-02 ENCOUNTER — Encounter: Payer: Self-pay | Admitting: Anesthesiology

## 2019-05-02 ENCOUNTER — Other Ambulatory Visit: Payer: Self-pay

## 2019-05-02 VITALS — BP 116/70 | HR 71 | Temp 96.6°F | Ht 74.0 in | Wt 347.4 lb

## 2019-05-02 DIAGNOSIS — I4819 Other persistent atrial fibrillation: Secondary | ICD-10-CM | POA: Diagnosis not present

## 2019-05-02 DIAGNOSIS — E785 Hyperlipidemia, unspecified: Secondary | ICD-10-CM | POA: Diagnosis not present

## 2019-05-02 DIAGNOSIS — I10 Essential (primary) hypertension: Secondary | ICD-10-CM

## 2019-05-02 NOTE — Patient Instructions (Signed)
Special Instructions: REFER TO EP FOR AFIB ISSUES  PLEASE READ AND FOLLOW SALTY 6 ATTACHED  Reduce your risk of getting COVID-19 With your heart disease it is especially important for people at increased risk of severe illness from COVID-19, and those who live with them, to protect themselves from getting COVID-19. The best way to protect yourself and to help reduce the spread of the virus that causes COVID-19 is to: Marland Kitchen Limit your interactions with other people as much as possible. . Take precautions to prevent getting COVID-19 when you do interact with others. If you start feeling sick and think you may have COVID-19, get in touch with your healthcare provider within 24 hours.  Follow-Up: 1 MONTH AFTER EP/AFIB APPOINTMENT In Person Shelva Majestic, MD.    At [facility], you and your health needs are our priority.  As part of our continuing mission to provide you with exceptional heart care, we have created designated Provider Care Teams.  These Care Teams include your primary Cardiologist (physician) and Advanced Practice Providers (APPs -  Physician Assistants and Nurse Practitioners) who all work together to provide you with the care you need, when you need it.  Thank you for choosing [facility] at [facility]!!

## 2019-05-02 NOTE — Anesthesia Preprocedure Evaluation (Signed)
Anesthesia Evaluation  Patient identified by MRN, date of birth, ID band Patient awake    Reviewed: Allergy & Precautions, NPO status , Patient's Chart, lab work & pertinent test results, reviewed documented beta blocker date and time   History of Anesthesia Complications Negative for: history of anesthetic complications  Airway Mallampati: IV  TM Distance: <3 FB Neck ROM: Full    Dental  (+) Teeth Intact, Dental Advisory Given,    Pulmonary shortness of breath, former smoker,    breath sounds clear to auscultation       Cardiovascular hypertension, Pt. on medications and Pt. on home beta blockers + dysrhythmias Atrial Fibrillation  Rhythm:Irregular  ECG: SR, rate 92  ECHO: Left ventricle: The cavity size was mildly dilated. Wall thickness was increased in a pattern of mild LVH. Systolic function was normal. The estimated ejection fraction was in the range of 50% to 55%. Wall motion was normal; there were no regional wall motion abnormalities. Doppler parameters are consistent with abnormal left ventricular relaxation (grade 1 diastolic dysfunction). Aortic valve: Transvalvular velocity was within the normal range. There was no stenosis. There was no regurgitation. Mitral valve: Transvalvular velocity was within the normal range. There was no evidence for stenosis. There was trivial regurgitation. Left atrium: The atrium was moderately dilated. Right ventricle: The cavity size was normal. Wall thickness was normal. Systolic function was normal. Tricuspid valve: There was no regurgitation.  Pt cleared for surgery 03/24/17 by Almyra Deforest, PA   Neuro/Psych  Neuromuscular disease negative psych ROS   GI/Hepatic negative GI ROS,   Endo/Other  diabetes, Type 2, Insulin DependentMorbid obesity  Renal/GU negative Renal ROS     Musculoskeletal  (+) Arthritis , Charcot's joint of right foot   Abdominal (+) + obese,   Peds  Hematology negative hematology ROS (+)   Anesthesia Other Findings right shoulder impingement  Reproductive/Obstetrics                             Anesthesia Physical Anesthesia Plan  ASA: III  Anesthesia Plan: General   Post-op Pain Management:    Induction: Intravenous  PONV Risk Score and Plan: 2 and Treatment may vary due to age or medical condition  Airway Management Planned: Mask  Additional Equipment: None  Intra-op Plan:   Post-operative Plan:   Informed Consent: I have reviewed the patients History and Physical, chart, labs and discussed the procedure including the risks, benefits and alternatives for the proposed anesthesia with the patient or authorized representative who has indicated his/her understanding and acceptance.     Dental advisory given  Plan Discussed with: CRNA and Surgeon  Anesthesia Plan Comments:         Anesthesia Quick Evaluation

## 2019-05-02 NOTE — Anesthesia Postprocedure Evaluation (Signed)
Anesthesia Post Note  Patient: DONTA FUSTER  Procedure(s) Performed: CARDIOVERSION (N/A )     Patient location during evaluation: Endoscopy Anesthesia Type: General Level of consciousness: awake and alert Pain management: pain level controlled Vital Signs Assessment: post-procedure vital signs reviewed and stable Respiratory status: spontaneous breathing, nonlabored ventilation, respiratory function stable and patient connected to nasal cannula oxygen Cardiovascular status: blood pressure returned to baseline and stable Postop Assessment: no apparent nausea or vomiting Anesthetic complications: no    Last Vitals:  Vitals:   04/26/19 1120 04/26/19 1130  BP: 110/77 119/68  Pulse: 64 67  Resp: 10 12  Temp:    SpO2: 95% 93%    Last Pain:  Vitals:   04/27/19 1300  TempSrc:   PainSc: 9                  Erik Luismanuel Corman

## 2019-05-03 ENCOUNTER — Telehealth: Payer: Self-pay | Admitting: Orthopaedic Surgery

## 2019-05-03 NOTE — Telephone Encounter (Signed)
Also would like a full MRI of his back. States he is having pain from his skull all the way down to his butt. I did advise him Dr Erlinda Hong is out of the office  and he wants to wait until Dr Erlinda Hong gets back so he can respond on message.

## 2019-05-03 NOTE — Telephone Encounter (Signed)
Pt called in requesting a call back from [facility] or dr.xu regarding some questions he has. Pt didn't want to speak to me.    [phone]

## 2019-05-03 NOTE — Telephone Encounter (Signed)
Would like a lift chair. Would to get it from [place]. He will call us when he wants [place] to fax  Rx

## 2019-05-05 DIAGNOSIS — M5412 Radiculopathy, cervical region: Secondary | ICD-10-CM | POA: Diagnosis not present

## 2019-05-05 DIAGNOSIS — M542 Cervicalgia: Secondary | ICD-10-CM | POA: Diagnosis not present

## 2019-05-05 DIAGNOSIS — M545 Low back pain: Secondary | ICD-10-CM | POA: Diagnosis not present

## 2019-05-05 DIAGNOSIS — M25511 Pain in right shoulder: Secondary | ICD-10-CM | POA: Diagnosis not present

## 2019-05-05 NOTE — Telephone Encounter (Signed)
Xu wants to see him,can we make an appt

## 2019-05-05 NOTE — Telephone Encounter (Signed)
I would like to see him first before ordering the MRI.  Thanks.

## 2019-05-11 NOTE — Telephone Encounter (Signed)
LVM for pt to call back.

## 2019-05-15 ENCOUNTER — Ambulatory Visit: Payer: Medicaid Other | Admitting: Cardiology

## 2019-05-15 ENCOUNTER — Other Ambulatory Visit (HOSPITAL_COMMUNITY)
Admission: RE | Admit: 2019-05-15 | Discharge: 2019-05-15 | Disposition: A | Discharge status: Home / Self Care | Payer: Medicaid Other | Source: Ambulatory Visit | Attending: Cardiology | Admitting: Cardiology

## 2019-05-15 ENCOUNTER — Other Ambulatory Visit: Payer: Self-pay

## 2019-05-15 ENCOUNTER — Encounter (HOSPITAL_BASED_OUTPATIENT_CLINIC_OR_DEPARTMENT_OTHER): Payer: Medicaid Other | Admitting: Cardiovascular Disease

## 2019-05-15 ENCOUNTER — Encounter: Payer: Self-pay | Admitting: Cardiology

## 2019-05-15 VITALS — BP 144/76 | HR 119 | Ht 74.0 in | Wt 355.0 lb

## 2019-05-15 DIAGNOSIS — I4819 Other persistent atrial fibrillation: Secondary | ICD-10-CM | POA: Diagnosis not present

## 2019-05-15 DIAGNOSIS — Z01812 Encounter for preprocedural laboratory examination: Secondary | ICD-10-CM | POA: Insufficient documentation

## 2019-05-15 DIAGNOSIS — Z20822 Contact with and (suspected) exposure to covid-19: Secondary | ICD-10-CM | POA: Insufficient documentation

## 2019-05-15 LAB — SARS CORONAVIRUS 2 (TAT 6-24 HRS): SARS Coronavirus 2: NEGATIVE

## 2019-05-15 NOTE — Progress Notes (Signed)
Electrophysiology Office Note   Date:  05/15/2019   ID:  AREND BAHL, DOB [birth year], MRN [id number]  PCP:  [name], MD  Cardiologist:  Claiborne Billings Primary Electrophysiologist:  Seynabou Fults Meredith Leeds, MD    Chief Complaint: AF   History of Present Illness: ALARIC GLADWIN is a 59 y.o. male who is being seen today for the evaluation of AF at the request of Marilynn Rail Jossie Ng, NP. Presenting today for electrophysiology evaluation.  He has a history significant for atrial fibrillation, diabetes, fatty liver, hypertension, obesity.  He also has a history of significant alcohol abuse drinking up to 4 cases of beer per week.  He has a history of unsuccessful cardioversions.  He was seen initially December 2018 after consuming a significant amount of beer.  He was noted be in atrial fibrillation and was put on Lopressor and Eliquis.  He underwent a successful cardioversion at that time.  In 2019 he had another episode of binge drinking and went into atrial fibrillation.  He was cardioverted again at that time.  He was seen November 2020 with increasing shortness of breath.  He states that he had not had any alcohol since 2018.  He was noted to be in atrial fibrillation.  He was started on amiodarone at that time.  He was again seen December 2020 and was noted to still be in atrial fibrillation.  He had a cardioversion x3 which was unsuccessful.  Today, he denies symptoms of palpitations, chest pain, orthopnea, PND, lower extremity edema, claudication, dizziness, presyncope, syncope, bleeding, or neurologic sequela. The patient is tolerating medications without difficulties.    Past Medical History:  Diagnosis Date  . Atrial fibrillation ([place])   . Charcot's joint of right foot   . DDD (degenerative disc disease), lumbar   . Diabetes mellitus without complication ([place])   . Fatty liver   . Hypertension   . Obesity   . Rotator cuff disorder   . Shoulder impingement, right    Past  Surgical History:  Procedure Laterality Date  . AMPUTATION Left 10/02/2014   Procedure: Left Third toe amputation ;  Surgeon: [name], MD;  Location: [facility];  Service: Orthopedics;  Laterality: Left;  Regular bed, wants to follow hip  . ANTERIOR CRUCIATE LIGAMENT REPAIR Right 90   reconstruction  . APPLICATION OF WOUND VAC Left 10/02/2014   Procedure: APPLICATION OF WOUND VAC; toe Surgeon: [name], MD;  Location: [facility];  Service: Orthopedics;  Laterality: Left;  . CARDIOVERSION N/A 04/26/2019   Procedure: CARDIOVERSION;  Surgeon: Pixie Casino, MD;  Location: [facility];  Service: Cardiovascular;  Laterality: N/A;  . I & D EXTREMITY Left 10/05/2014   Procedure: IRRIGATION AND DEBRIDEMENT LEFT FOOT;  Surgeon: [name], MD;  Location: [facility];  Service: Orthopedics;  Laterality: Left;  . KNEE ARTHROSCOPY W/ ACL RECONSTRUCTION Right   . TOTAL KNEE ARTHROPLASTY Right 03/28/2015  . TOTAL KNEE ARTHROPLASTY Right 03/28/2015   Procedure: RIGHT TOTAL KNEE ARTHROPLASTY;  Surgeon: [name], MD;  Location: [facility];  Service: Orthopedics;  Laterality: Right;     Current Outpatient Medications  Medication Sig Dispense Refill  . Accu-Chek FastClix Lancets MISC Use as directed to check blood sugar at least twice daily. E11.8 E11.65 Z79.4 102 each 11  . amiodarone (PACERONE) 200 MG tablet Take 1 tablet (200 mg total) by mouth 2 (two) times daily. 60 tablet 3  . apixaban (ELIQUIS) 5 MG TABS tablet Take 1 tablet (  5 mg total) by mouth 2 (two) times daily. 60 tablet 6  . atorvastatin (LIPITOR) 20 MG tablet Take 1 tablet (20 mg total) by mouth daily. 90 tablet 1  . Blood Glucose Monitoring Suppl (ACCU-CHEK AVIVA) device Use as instructed to check blood sugar 2 times daily. E11.8 E11.65 Z79.4 1 each 0  . furosemide (LASIX) 40 MG tablet Take 1 tablet (40 mg total) by mouth daily. 90 tablet 3  . gabapentin (NEURONTIN) 300 MG capsule Take 1 capsule (300 mg total) by mouth 2 (two) times daily. (Patient  taking differently: Take 600 mg by mouth at bedtime. ) 180 capsule 1  . glucose blood (ACCU-CHEK AVIVA) test strip Use as instructed to check blood sugar 2 times daily. E11.8 E11.65 Z79.4 100 each 12  . Insulin Glargine (LANTUS SOLOSTAR) 100 UNIT/ML Solostar Pen Inject 50 Units into the skin 2 (two) times daily. 30 mL 6  . insulin lispro (INSULIN LISPRO) 100 UNIT/ML KwikPen Junior Inject 0.08 mLs (8 Units total) into the skin 3 (three) times daily. (Patient taking differently: Inject 7 Units into the skin 3 (three) times daily. ) 30 mL 6  . Insulin Pen Needle (B-D ULTRAFINE III SHORT PEN) 31G X 8 MM MISC 1 each by Does not apply route 3 (three) times daily. 100 each 5  . Insulin Syringe-Needle U-100 (TRUEPLUS INSULIN SYRINGE) 30G X 5/16" 0.5 ML MISC Use as directed 3 times daily 100 each 5  . liraglutide (VICTOZA) 18 MG/3ML SOPN Inject 0.3 mLs (1.8 mg total) into the skin daily. 30 mL 6  . losartan (COZAAR) 50 MG tablet Take 1 tablet (50 mg total) by mouth daily. 90 tablet 1  . metoprolol tartrate (LOPRESSOR) 100 MG tablet Take 1 tablet (100 mg total) by mouth 2 (two) times daily. 180 tablet 1  . oxyCODONE-acetaminophen (PERCOCET) 10-325 MG tablet Take 1 tablet by mouth every 4 (four) hours as needed for pain.    . sodium chloride (OCEAN) 0.65 % SOLN nasal spray Place 1 spray into both nostrils at bedtime as needed for congestion.     Marland Kitchen testosterone cypionate (DEPOTESTOSTERONE CYPIONATE) 200 MG/ML injection Inject 1 mL (200 mg total) into the muscle every 28 (twenty-eight) days. 10 mL 3  . tiZANidine (ZANAFLEX) 2 MG tablet Take 2 mg by mouth at bedtime as needed for muscle spasms.    Karen Chafe INSULIN SYRINGE 30G X 5/16" 0.5 ML MISC USE AS DIRECTED 3 TIMES DAILY 100 each 0   No current facility-administered medications for this visit.    Allergies:   Metformin and related   Social History:  The patient  reports that he has quit smoking. His smoking use included cigarettes. He smoked 0.00 packs  per day for 38.00 years. He has never used smokeless tobacco. He reports current drug use. Drug: Marijuana. He reports that he does not drink alcohol.   Family History:  The patient's family history includes Diabetes in his father; Heart failure in his father; Hypertension in his father.    ROS:  Please see the history of present illness.   Otherwise, review of systems is positive for none.   All other systems are reviewed and negative.    PHYSICAL EXAM: VS:  BP (!) 144/76   Pulse (!) 119   Ht 6\' 2"  (1.88 m)   Wt (!) 355 lb (161 kg)   BMI 45.58 kg/m  , BMI Body mass index is 45.58 kg/m. GEN: Well nourished, well developed, in no acute distress  HEENT:  normal  Neck: no JVD, carotid bruits, or masses Cardiac: irregular; no murmurs, rubs, or gallops,no edema  Respiratory:  clear to auscultation bilaterally, normal work of breathing GI: soft, nontender, nondistended, + BS MS: no deformity or atrophy  Skin: warm and dry Neuro:  Strength and sensation are intact Psych: euthymic mood, full affect  EKG:  EKG is not ordered today. Personal review of the ekg ordered 05/02/19 shows atrial fibrillation, rate 65  Recent Labs: 02/13/2019: ALT 25; Magnesium 2.6; TSH 2.490 04/21/2019: BUN 17; Creatinine, Ser 0.94; Hemoglobin 14.8; Platelets 209; Potassium 4.9; Sodium 140    Lipid Panel     Component Value Date/Time   CHOL 157 04/04/2018 1103   TRIG 256 (H) 04/04/2018 1103   HDL 42 04/04/2018 1103   CHOLHDL 3.7 04/04/2018 1103   CHOLHDL 3.8 04/10/2010 0315   VLDL 32 04/10/2010 0315   LDLCALC 64 04/04/2018 1103     Wt Readings from Last 3 Encounters:  05/15/19 (!) 355 lb (161 kg)  05/02/19 (!) 347 lb 6.4 oz (157.6 kg)  04/26/19 (!) 355 lb 9.6 oz (161.3 kg)      Other studies Reviewed: Additional studies/ records that were reviewed today include: TTE 02/28/19  Review of the above records today demonstrates:  1. Left ventricular ejection fraction, by visual estimation, is 55 to   60%. The left ventricle has normal function. There is moderately increased  left ventricular hypertrophy.  2. Left ventricular diastolic function could not be evaluated.  3. Global right ventricle has mildly reduced systolic function.The right  ventricular size is normal. No increase in right ventricular wall  thickness.  4. Left atrial size was mildly dilated.  5. Right atrial size was moderately dilated.  6. The mitral valve is normal in structure. Mild mitral valve  regurgitation. No evidence of mitral stenosis.  7. The tricuspid valve is normal in structure. Tricuspid valve  regurgitation is mild.  8. The aortic valve is tricuspid. Aortic valve regurgitation is not  visualized. No evidence of aortic valve sclerosis or stenosis.  9. The pulmonic valve was normal in structure. Pulmonic valve  regurgitation is not visualized.  10. Moderately elevated pulmonary artery systolic pressure.  11. The atrial septum is grossly normal.    ASSESSMENT AND PLAN:  1.  Persistent atrial fibrillation: Currently on Eliquis, amiodarone, metoprolol.  CHA2DS2-VASc of at least 2.  He has quite a bit of weakness, fatigue, and shortness of breath that is likely due to his atrial fibrillation.  He says that he stopped drinking August 2018.  I do think that he would likely benefit from a repeat cardioversion attempt.  We Darika Ildefonso do this in the EP lab and use our defibrillators.  He Whyatt Klinger potentially need a double defibrillator cardioversion.  2.  Hypertension: Elevated today.  We Jamareon Shimel recheck post cardioversion  3.  Hyperlipidemia: LDL of 64.  Continue atorvastatin.  4.  Snoring: Likely has obstructive sleep apnea.  He Beva Remund need a sleep study.  He also has pulmonary hypertension on his echo.  I Alroy Portela discuss this with his primary cardiologist.  5.  Morbid obesity: I discussed with him diet and exercise.  He does say that exercise Wissam Resor be difficult due to his multiple orthopedic issues.  He Sheenah Dimitroff work on a  heart healthy diet.  Case discussed with primary cardiology  Current medicines are reviewed at length with the patient today.   The patient does not have concerns regarding his medicines.  The following changes were made today:  none  Labs/ tests ordered today include:  No orders of the defined types were placed in this encounter.    Disposition:   FU with Lanika Colgate 3 months  Signed, Coolidge Gossard Meredith Leeds, MD  05/15/2019 12:05 PM     [facility] [address] [place] [address] [phone] (office) [phone] (fax)

## 2019-05-15 NOTE — Patient Instructions (Addendum)
Medication Instructions:  Your physician recommends that you continue on your current medications as directed. Please refer to the Current Medication list given to you today.  *If you need a refill on your cardiac medications before your next appointment, please call your pharmacy*  Lab Work: None ordered If you have labs (blood work) drawn today and your tests are completely normal, you will receive your results only by: Marland Kitchen MyChart Message (if you have MyChart) OR . A paper copy in the mail If you have any lab test that is abnormal or we need to change your treatment, we will call you to review the results.  Testing/Procedures: Your physician has recommended that you have a Cardioversion (DCCV). Electrical Cardioversion uses a jolt of electricity to your heart either through paddles or wired patches attached to your chest. This is a controlled, usually prescheduled, procedure. Defibrillation is done under light anesthesia in the hospital, and you usually go home the day of the procedure. This is done to get your heart back into a normal rhythm. You are not awake for the procedure. Please see the instructions below.   Follow-Up: At [facility], you and your health needs are our priority.  As part of our continuing mission to provide you with exceptional heart care, we have created designated Provider Care Teams.  These Care Teams include your primary Cardiologist (physician) and Advanced Practice Providers (APPs -  Physician Assistants and Nurse Practitioners) who all work together to provide you with the care you need, when you need it.  Your next appointment:   3 month(s)  The format for your next appointment:   In Person  Provider:   Allegra Lai, MD  Other Instructions   COVID TEST-- On 05/15/2019 @ 12:30 pm - You will go to [facility] hospital ([place]) for your Covid testing.   This is a drive thru test site.  There will be multiple testing areas.  Be sure  to share with the first checkpoint that you are there for pre-procedure/surgery testing. This will put you into the right (yellow) lane that leads to the PAT testing team. Stay in your car and the nurse team will come to your car to test you.  After you are tested please go home and self quarantine until the day of your procedure.                             CARDIOVERSION INSTRUCTIONS:  Please arrive at the [facility] main entrance of [facility] hospital on 05/18/2019 at  9:30 am Do not eat or drink after midnight prior to procedure Take 1/2 your usual dose of insulin the night before this procedure. Hold all diabetic medications the morning of this procedure. Take your remaining medications the morning of your procedure with a sip of water. You will need someone to drive you home at discharge

## 2019-05-17 ENCOUNTER — Telehealth: Payer: Self-pay | Admitting: Cardiology

## 2019-05-17 NOTE — Telephone Encounter (Signed)
Patient calling stating he has not heard back about his covid test results and he has his procedure scheduled for tomorrow. He would like to know if the results were in and if he will still have his procedure. Please advise.

## 2019-05-17 NOTE — Telephone Encounter (Signed)
Pt advised testing negative, proceed w/ planned DCCV tomorrow. Pt reports he still intends to go tomorrow, weather permitting. Short Stay number given to pt to call if weather keeps him from making it to the hospital. Patient verbalized understanding and agreeable to plan.

## 2019-05-18 ENCOUNTER — Other Ambulatory Visit: Payer: Self-pay

## 2019-05-18 ENCOUNTER — Ambulatory Visit (HOSPITAL_COMMUNITY): Payer: Medicaid Other

## 2019-05-18 ENCOUNTER — Ambulatory Visit (HOSPITAL_COMMUNITY)
Admission: RE | Admit: 2019-05-18 | Discharge: 2019-05-18 | Disposition: A | Discharge status: Home / Self Care | Payer: Medicaid Other | Attending: Cardiology | Admitting: Cardiology

## 2019-05-18 ENCOUNTER — Encounter (HOSPITAL_COMMUNITY)
Admission: RE | Disposition: A | Discharge status: Home / Self Care | Payer: Self-pay | Source: Home / Self Care | Attending: Cardiology

## 2019-05-18 DIAGNOSIS — Z6841 Body Mass Index (BMI) 40.0 and over, adult: Secondary | ICD-10-CM | POA: Insufficient documentation

## 2019-05-18 DIAGNOSIS — I10 Essential (primary) hypertension: Secondary | ICD-10-CM | POA: Diagnosis not present

## 2019-05-18 DIAGNOSIS — K7460 Unspecified cirrhosis of liver: Secondary | ICD-10-CM | POA: Insufficient documentation

## 2019-05-18 DIAGNOSIS — E785 Hyperlipidemia, unspecified: Secondary | ICD-10-CM | POA: Diagnosis not present

## 2019-05-18 DIAGNOSIS — Z794 Long term (current) use of insulin: Secondary | ICD-10-CM | POA: Diagnosis not present

## 2019-05-18 DIAGNOSIS — E1152 Type 2 diabetes mellitus with diabetic peripheral angiopathy with gangrene: Secondary | ICD-10-CM | POA: Diagnosis not present

## 2019-05-18 DIAGNOSIS — I4819 Other persistent atrial fibrillation: Secondary | ICD-10-CM | POA: Insufficient documentation

## 2019-05-18 DIAGNOSIS — R0683 Snoring: Secondary | ICD-10-CM | POA: Diagnosis not present

## 2019-05-18 DIAGNOSIS — Z7901 Long term (current) use of anticoagulants: Secondary | ICD-10-CM | POA: Diagnosis not present

## 2019-05-18 DIAGNOSIS — E119 Type 2 diabetes mellitus without complications: Secondary | ICD-10-CM | POA: Diagnosis not present

## 2019-05-18 DIAGNOSIS — Z79899 Other long term (current) drug therapy: Secondary | ICD-10-CM | POA: Diagnosis not present

## 2019-05-18 HISTORY — PX: CARDIOVERSION: EP1203

## 2019-05-18 LAB — BASIC METABOLIC PANEL
Anion gap: 10 (ref 5–15)
BUN: 15 mg/dL (ref 6–20)
CO2: 24 mmol/L (ref 22–32)
Calcium: 9 mg/dL (ref 8.9–10.3)
Chloride: 104 mmol/L (ref 98–111)
Creatinine, Ser: 0.98 mg/dL (ref 0.61–1.24)
GFR calc Af Amer: >60 mL/min (ref >60)
GFR calc non Af Amer: >60 mL/min (ref >60)
Glucose, Bld: 138 mg/dL — ABNORMAL HIGH (ref 70–99)
Potassium: 4.5 mmol/L (ref 3.5–5.1)
Sodium: 138 mmol/L (ref 135–145)

## 2019-05-18 LAB — CBC
HCT: 45.6 % (ref 39.0–52.0)
Hemoglobin: 15 g/dL (ref 13.0–17.0)
MCH: 28.8 pg (ref 26.0–34.0)
MCHC: 32.9 g/dL (ref 30.0–36.0)
MCV: 87.7 fL (ref 80.0–100.0)
Platelets: 225 10*3/uL (ref 150–400)
RBC: 5.2 MIL/uL (ref 4.22–5.81)
RDW: 14.3 % (ref 11.5–15.5)
WBC: 9.7 10*3/uL (ref 4.0–10.5)
nRBC: 0 % (ref 0.0–0.2)

## 2019-05-18 LAB — GLUCOSE, CAPILLARY
Glucose-Capillary: 131 mg/dL — ABNORMAL HIGH (ref 70–99)
Glucose-Capillary: 96 mg/dL (ref 70–99)

## 2019-05-18 SURGERY — CARDIOVERSION (CATH LAB)
Anesthesia: General

## 2019-05-18 MED ORDER — PROPOFOL 10 MG/ML IV BOLUS
INTRAVENOUS | Status: DC | PRN
  Administered 2019-05-18: 110 mg via INTRAVENOUS

## 2019-05-18 MED ORDER — SODIUM CHLORIDE 0.9 % IV SOLN: INTRAVENOUS | Status: DC | PRN

## 2019-05-18 MED ORDER — LIDOCAINE HCL (CARDIAC) PF 100 MG/5ML IV SOSY
PREFILLED_SYRINGE | INTRAVENOUS | Status: DC | PRN
  Administered 2019-05-18: 100 mg via INTRATRACHEAL

## 2019-05-18 SURGICAL SUPPLY — 1 items: PAD PRO RADIOLUCENT 2001M-C (PAD) ×6 IMPLANT

## 2019-05-18 NOTE — Transfer of Care (Signed)
Immediate Anesthesia Transfer of Care Note  Patient: Jeffery [name]  Procedure(s) Performed: CARDIOVERSION (CATH LAB) (N/A )  Patient Location: Cath Lab  Anesthesia Type:General  Level of Consciousness: awake, alert  and oriented  Airway & Oxygen Therapy: Patient Spontanous Breathing and Patient connected to nasal cannula oxygen  Post-op Assessment: Report given to RN, Post -op Vital signs reviewed and stable and Patient moving all extremities  Post vital signs: Reviewed and stable  Last Vitals:  Vitals Value Taken Time  BP 123/79 05/18/19 1157  Temp 36.4 C 05/18/19 1158  Pulse    Resp 11 05/18/19 1159  SpO2    Vitals shown include unvalidated device data.  Last Pain:  Vitals:   05/18/19 1158  TempSrc: Temporal  PainSc: 0-No pain      Patients Stated Pain Goal: 8 (72/09/47 0962)  Complications: No apparent anesthesia complications

## 2019-05-18 NOTE — Discharge Instructions (Signed)
Electrical Cardioversion Electrical cardioversion is the delivery of a jolt of electricity to restore a normal rhythm to the heart. A rhythm that is too fast or is not regular keeps the heart from pumping well. In this procedure, sticky patches or metal paddles are placed on the chest to deliver electricity to the heart from a device. This procedure may be done in an emergency if:  There is low or no blood pressure as a result of the heart rhythm.  Normal rhythm must be restored as fast as possible to protect the brain and heart from further damage.  It may save a life. This may also be a scheduled procedure for irregular or fast heart rhythms that are not immediately life-threatening. Tell a health care provider about:  Any allergies you have.  All medicines you are taking, including vitamins, herbs, eye drops, creams, and over-the-counter medicines.  Any problems you or family members have had with anesthetic medicines.  Any blood disorders you have.  Any surgeries you have had.  Any medical conditions you have.  Whether you are pregnant or may be pregnant. What are the risks? Generally, this is a safe procedure. However, problems may occur, including:  Allergic reactions to medicines.  A blood clot that breaks free and travels to other parts of your body.  The possible return of an abnormal heart rhythm within hours or days after the procedure.  Your heart stopping (cardiac arrest). This is rare. What happens before the procedure? Medicines  Your health care provider may have you start taking: ? Blood-thinning medicines (anticoagulants) so your blood does not clot as easily. ? Medicines to help stabilize your heart rate and rhythm.  Ask your health care provider about: ? Changing or stopping your regular medicines. This is especially important if you are taking diabetes medicines or blood thinners. ? Taking medicines such as aspirin and ibuprofen. These medicines can  thin your blood. Do not take these medicines unless your health care provider tells you to take them. ? Taking over-the-counter medicines, vitamins, herbs, and supplements. General instructions  Follow instructions from your health care provider about eating or drinking restrictions.  Plan to have someone take you home from the hospital or clinic.  If you will be going home right after the procedure, plan to have someone with you for 24 hours.  Ask your health care provider what steps will be taken to help prevent infection. These may include washing your skin with a germ-killing soap. What happens during the procedure?   An IV will be inserted into one of your veins.  Sticky patches (electrodes) or metal paddles may be placed on your chest.  You will be given a medicine to help you relax (sedative).  An electrical shock will be delivered. The procedure may vary among health care providers and hospitals. What can I expect after the procedure?  Your blood pressure, heart rate, breathing rate, and blood oxygen level will be monitored until you leave the hospital or clinic.  Your heart rhythm will be watched to make sure it does not change.  You may have some redness on the skin where the shocks were given. Follow these instructions at home:  Do not drive for 24 hours if you were given a sedative during your procedure.  Take over-the-counter and prescription medicines only as told by your health care provider.  Ask your health care provider how to check your pulse. Check it often.  Rest for 48 hours after the procedure or   as told by your health care provider.  Avoid or limit your caffeine use as told by your health care provider.  Keep all follow-up visits as told by your health care provider. This is important. Contact a health care provider if:  You feel like your heart is beating too quickly or your pulse is not regular.  You have a serious muscle cramp that does not go  away. Get help right away if:  You have discomfort in your chest.  You are dizzy or you feel faint.  You have trouble breathing or you are short of breath.  Your speech is slurred.  You have trouble moving an arm or leg on one side of your body.  Your fingers or toes turn cold or blue. Summary  Electrical cardioversion is the delivery of a jolt of electricity to restore a normal rhythm to the heart.  This procedure may be done right away in an emergency or may be a scheduled procedure if the condition is not an emergency.  Generally, this is a safe procedure.  After the procedure, check your pulse often as told by your health care provider. This information is not intended to replace advice given to you by your health care provider. Make sure you discuss any questions you have with your health care provider. Document Revised: 10/17/2018 Document Reviewed: 10/17/2018 Elsevier Patient Education  2020 [employer]  

## 2019-05-18 NOTE — H&P (Signed)
Jeffery [name] has presented today for surgery, with the diagnosis of atrial fibrillation.  The various methods of treatment have been discussed with the patient and family. After consideration of risks, benefits and other options for treatment, the patient has consented to  Procedure(s): cardioversion as a surgical intervention .  Risks include but not limited to arrhythmia, skin irritation, bleeding, tamponade, heart block, stroke, damage to surrounding organs, among others. The patient's history has been reviewed, patient examined, no change in status, stable for surgery.  I have reviewed the patient's chart and labs.  Questions were answered to the patient's satisfaction.    Jazz Biddy Curt Bears, MD 05/18/2019 10:52 AM

## 2019-05-18 NOTE — Anesthesia Preprocedure Evaluation (Signed)
Anesthesia Evaluation  Patient identified by MRN, date of birth, ID band Patient awake    Reviewed: Allergy & Precautions, NPO status , Patient's Chart, lab work & pertinent test results, reviewed documented beta blocker date and time   History of Anesthesia Complications Negative for: history of anesthetic complications  Airway Mallampati: IV  TM Distance: <3 FB Neck ROM: Full    Dental  (+) Teeth Intact, Dental Advisory Given,    Pulmonary shortness of breath, former smoker,    breath sounds clear to auscultation       Cardiovascular hypertension, Pt. on medications and Pt. on home beta blockers + dysrhythmias Atrial Fibrillation  Rhythm:Irregular  ECG: SR, rate 92  ECHO: Left ventricle: The cavity size was mildly dilated. Wall thickness was increased in a pattern of mild LVH. Systolic function was normal. The estimated ejection fraction was in the range of 50% to 55%. Wall motion was normal; there were no regional wall motion abnormalities. Doppler parameters are consistent with abnormal left ventricular relaxation (grade 1 diastolic dysfunction). Aortic valve: Transvalvular velocity was within the normal range. There was no stenosis. There was no regurgitation. Mitral valve: Transvalvular velocity was within the normal range. There was no evidence for stenosis. There was trivial regurgitation. Left atrium: The atrium was moderately dilated. Right ventricle: The cavity size was normal. Wall thickness was normal. Systolic function was normal. Tricuspid valve: There was no regurgitation.  Pt cleared for surgery 03/24/17 by Almyra Deforest, PA   Neuro/Psych  Neuromuscular disease negative psych ROS   GI/Hepatic negative GI ROS,   Endo/Other  diabetes, Type 2, Insulin DependentMorbid obesity  Renal/GU negative Renal ROS     Musculoskeletal  (+) Arthritis , Charcot's joint of right foot   Abdominal (+) + obese,   Peds  Hematology negative hematology ROS (+)   Anesthesia Other Findings right shoulder impingement  Reproductive/Obstetrics                             Anesthesia Physical  Anesthesia Plan  ASA: III  Anesthesia Plan: General   Post-op Pain Management:    Induction: Intravenous  PONV Risk Score and Plan: 2 and Treatment may vary due to age or medical condition, Propofol infusion and TIVA  Airway Management Planned: Mask  Additional Equipment: None  Intra-op Plan:   Post-operative Plan:   Informed Consent: I have reviewed the patients History and Physical, chart, labs and discussed the procedure including the risks, benefits and alternatives for the proposed anesthesia with the patient or authorized representative who has indicated his/her understanding and acceptance.     Dental advisory given  Plan Discussed with: CRNA  Anesthesia Plan Comments:         Anesthesia Quick Evaluation

## 2019-05-18 NOTE — Anesthesia Postprocedure Evaluation (Signed)
Anesthesia Post Note  Patient: Jeffery [name]  Procedure(s) Performed: CARDIOVERSION (CATH LAB) (N/A )     Patient location during evaluation: PACU Anesthesia Type: General Level of consciousness: sedated and patient cooperative Pain management: pain level controlled Vital Signs Assessment: post-procedure vital signs reviewed and stable Respiratory status: spontaneous breathing Cardiovascular status: stable Anesthetic complications: no    Last Vitals:  Vitals:   05/18/19 1238 05/18/19 1251  BP: 119/74 119/66  Pulse: 86 83  Resp:    Temp:    SpO2: 98% 99%    Last Pain:  Vitals:   05/18/19 1259  TempSrc:   PainSc: 0-No pain                 Nolon Nations

## 2019-05-20 ENCOUNTER — Other Ambulatory Visit (HOSPITAL_COMMUNITY): Payer: Medicaid Other

## 2019-06-02 DIAGNOSIS — M5412 Radiculopathy, cervical region: Secondary | ICD-10-CM | POA: Diagnosis not present

## 2019-06-02 DIAGNOSIS — M545 Low back pain: Secondary | ICD-10-CM | POA: Diagnosis not present

## 2019-06-02 DIAGNOSIS — M25552 Pain in left hip: Secondary | ICD-10-CM | POA: Diagnosis not present

## 2019-06-02 DIAGNOSIS — M542 Cervicalgia: Secondary | ICD-10-CM | POA: Diagnosis not present

## 2019-06-05 ENCOUNTER — Encounter: Payer: Self-pay | Admitting: Orthopaedic Surgery

## 2019-06-05 ENCOUNTER — Ambulatory Visit (INDEPENDENT_AMBULATORY_CARE_PROVIDER_SITE_OTHER): Payer: Medicaid Other | Admitting: Orthopaedic Surgery

## 2019-06-05 ENCOUNTER — Ambulatory Visit (INDEPENDENT_AMBULATORY_CARE_PROVIDER_SITE_OTHER): Payer: Medicaid Other

## 2019-06-05 ENCOUNTER — Other Ambulatory Visit: Payer: Self-pay

## 2019-06-05 VITALS — Ht 74.0 in | Wt 355.0 lb

## 2019-06-05 DIAGNOSIS — M545 Low back pain, unspecified: Secondary | ICD-10-CM

## 2019-06-05 DIAGNOSIS — G8929 Other chronic pain: Secondary | ICD-10-CM

## 2019-06-05 NOTE — Progress Notes (Signed)
Office Visit Note   Patient: Jeffery [name]           Date of Birth: 03/31/1960           MRN: [id number] Visit Date: 06/05/2019              Requested by: [name], MD [place],  [place] [address] PCP: [name], MD   Assessment & Plan: Visit Diagnoses:  1. Chronic midline low back pain, unspecified whether sciatica present     Plan: Impression is multilevel thoracic and lumbar spinal stenosis.  The patient has previously failed epidural steroid injections and would like to be referred to neurosurgery at this time.  He will follow up with [place] as needed.  Follow-Up Instructions: Return if symptoms worsen or fail to improve.   Orders:  Orders Placed This Encounter  Procedures  . XR Thoracic Spine 2 View  . XR Lumbar Spine 2-3 Views   No orders of the defined types were placed in this encounter.     Procedures: No procedures performed   Clinical Data: No additional findings.   Subjective: Chief Complaint  Patient presents with  . Spine - Pain    HPI patient is a 59 year old gentleman who comes in today with mid and lower back pain.  He has had intermittent pain after sustaining a fall several years ago.  His pain worsened over the past few weeks when he was outside bending over picking up sticks.  The pain he has goes across the entire mid back.  No pain going down either leg.  His pain is increased when he is sleeping, bending over or twisting.  He has been taking up to 50 mg of Percocet daily which is provided by his pain clinic.  He denies any bowel or bladder change and no saddle paresthesias.  He does have numbness and tingling to both feet but has a history of diabetic peripheral neuropathy.  He also notes a history of a herniated disc at L4-5 for which she has had epidural steroid injections by Dr. Ernestina Patches without relief of symptoms.  He denies ever having gone to physical therapy for his back.  Review of Systems as detailed in  HPI.  All others reviewed and are negative.   Objective: Vital Signs: Ht 6\' 2"  (1.88 m)   Wt (!) 355 lb (161 kg)   BMI 45.58 kg/m   Physical Exam well-developed well-nourished gentleman in no acute distress.  Alert oriented x3.  Ortho Exam examination of his lumbar spine reveals mild upper lumbar spinous tenderness.  No paraspinous tenderness.  Negative straight leg raise both sides.  Increased pain with lumbar flexion and rotation to the right.  No focal weakness.  He is neurovascularly intact distally.  Specialty Comments:  No specialty comments available.  Imaging: XR Thoracic Spine 2 View  Result Date: 06/05/2019 Multilevel spondylosis  XR Lumbar Spine 2-3 Views  Result Date: 06/05/2019 Multilevel spondylosis    PMFS History: Patient Active Problem List   Diagnosis Date Noted  . Persistent atrial fibrillation ([place])   . Testosterone deficiency 03/16/2018  . Chronic left shoulder pain 03/01/2018  . Body mass index 40.0-44.9, adult ([place]) 03/01/2018  . DDD (degenerative disc disease), lumbar 12/15/2017  . Chronic anticoagulation 10/26/2017  . Dyspnea 10/26/2017  . S/P right rotator cuff repair 04/05/2017  . Achilles tendon contracture, right 01/05/2017  . Closed nondisplaced fracture of distal phalanx of right great toe 01/05/2017  . Paroxysmal atrial fibrillation (  [place]) 10/30/2016  . Pain in right ankle and joints of right foot 07/09/2016  . Diabetic polyneuropathy associated with type 2 diabetes mellitus ([place]) 07/09/2016  . 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 Impingement syndrome of right shoulder 07/07/2016  . Morbid obesity ([place]) 06/05/2016  . S/P TKR (total knee replacement), right 03/28/2015  . Knee osteoarthritis 11/12/2014  . Hypertension 10/15/2014  . Charcot foot due to diabetes mellitus ([place]) 10/01/2014  . Accelerated hypertension 10/01/2014  . Gangrene left third toe 10/01/2014   Past Medical  History:  Diagnosis Date  . Atrial fibrillation ([place])   . Charcot's joint of right foot   . DDD (degenerative disc disease), lumbar   . Diabetes mellitus without complication ([place])   . Fatty liver   . Hypertension   . Obesity   . Rotator cuff disorder   . Shoulder impingement, right     Family History  Problem Relation Age of Onset  . Diabetes Father   . Hypertension Father   . Heart failure Father     Past Surgical History:  Procedure Laterality Date  . AMPUTATION Left 10/02/2014   Procedure: Left Third toe amputation ;  Surgeon: [name], MD;  Location: [facility];  Service: Orthopedics;  Laterality: Left;  Regular bed, wants to follow hip  . ANTERIOR CRUCIATE LIGAMENT REPAIR Right 90   reconstruction  . APPLICATION OF WOUND VAC Left 10/02/2014   Procedure: APPLICATION OF WOUND VAC; toe Surgeon: [name], MD;  Location: [facility];  Service: Orthopedics;  Laterality: Left;  . CARDIOVERSION N/A 04/26/2019   Procedure: CARDIOVERSION;  Surgeon: Pixie Casino, MD;  Location: [facility];  Service: Cardiovascular;  Laterality: N/A;  . CARDIOVERSION N/A 05/18/2019   Procedure: CARDIOVERSION (CATH LAB);  Surgeon: Constance Haw, MD;  Location: [facility];  Service: Cardiovascular;  Laterality: N/A;  . I & D EXTREMITY Left 10/05/2014   Procedure: IRRIGATION AND DEBRIDEMENT LEFT FOOT;  Surgeon: [name], MD;  Location: [facility];  Service: Orthopedics;  Laterality: Left;  . KNEE ARTHROSCOPY W/ ACL RECONSTRUCTION Right   . TOTAL KNEE ARTHROPLASTY Right 03/28/2015  . TOTAL KNEE ARTHROPLASTY Right 03/28/2015   Procedure: RIGHT TOTAL KNEE ARTHROPLASTY;  Surgeon: [name], MD;  Location: [facility];  Service: Orthopedics;  Laterality: Right;   Social History   Occupational History  . Occupation: Management consultant  Tobacco Use  . Smoking status: Former Smoker    Packs/day: 0.00    Years: 38.00    Pack years: 0.00    Types: Cigarettes  . Smokeless tobacco: Never  Used  Substance and Sexual Activity  . Alcohol use: No    Alcohol/week: 5.0 - 6.0 standard drinks    Types: 5 - 6 Cans of beer per week  . Drug use: Yes    Types: Marijuana    Comment: last week ; denies 03/24/17  . Sexual activity: Not on file

## 2019-06-08 ENCOUNTER — Other Ambulatory Visit: Payer: Self-pay | Admitting: Cardiovascular Disease

## 2019-06-14 ENCOUNTER — Ambulatory Visit: Payer: Medicaid Other | Admitting: Cardiovascular Disease

## 2019-06-14 ENCOUNTER — Encounter: Payer: Self-pay | Admitting: Cardiovascular Disease

## 2019-06-14 ENCOUNTER — Other Ambulatory Visit: Payer: Self-pay

## 2019-06-14 VITALS — BP 92/66 | HR 69 | Temp 97.6°F | Ht 72.0 in | Wt 351.0 lb

## 2019-06-14 DIAGNOSIS — Z794 Long term (current) use of insulin: Secondary | ICD-10-CM

## 2019-06-14 DIAGNOSIS — Z7901 Long term (current) use of anticoagulants: Secondary | ICD-10-CM | POA: Diagnosis not present

## 2019-06-14 DIAGNOSIS — I10 Essential (primary) hypertension: Secondary | ICD-10-CM | POA: Diagnosis not present

## 2019-06-14 DIAGNOSIS — R0683 Snoring: Secondary | ICD-10-CM | POA: Diagnosis not present

## 2019-06-14 DIAGNOSIS — E118 Type 2 diabetes mellitus with unspecified complications: Secondary | ICD-10-CM

## 2019-06-14 DIAGNOSIS — I480 Paroxysmal atrial fibrillation: Secondary | ICD-10-CM

## 2019-06-14 DIAGNOSIS — E785 Hyperlipidemia, unspecified: Secondary | ICD-10-CM

## 2019-06-14 NOTE — Progress Notes (Signed)
Cardiology Office Note    Date:  06/16/2019   ID:  Jeffery [name], DOB 12/24/1960, MRN [id number]  PCP:  [name], Jeffery [name]  Cardiologist:  Shelva Majestic, Jeffery [name]   Chief Complaint  Patient presents with  . Follow-up    1 month.    History of Present Illness:  Jeffery [name] is a 59 y.o. male who is the son of my former patient Jeffery [name]. He established cardiology care with me in December 2018.  I had seen him for preoperative clearance in November 2020 with follow-up on March 13, 2019 and January 20 121.  He presents for 2 month follow-up evaluation.   Jeffery [name] has a history of obesity, diabetes mellitus, and hypertension.  He previously had a significant EtOH history and was drinking up to 4 cases of beer per week.  In May after having significant amount of beer the night before he woke up in the morning feeling weak.  He presented and was found to be in atrial fibrillation of new onset.  He was placed on Lopressor and eliquis and underwent successful cardioversion.  His cha2ds2vasc score was 2.  He was seen in A. fib clinic in follow-up on 08/27/2016 and was in sinus rhythm with PACs.  An echo Doppler study showed an EF of 50-55% with grade 1 diastolic dysfunction.  His left atrium was moderately dilated.  There was trivial MR.  He had done well maintaining sinus rhythm until August when again he had another alcohol binge and was back in atrial fibrillation.  He had been having difficulty with his back and had been off eliquis a week earlier to allow for treatment with ibuprofen.  He again was cardioverted for his A. fib with a rate of 169 on presentation and eliquis was resumed.  The patient has not had any alcohol since that presentation and quit smoking.    When I saw him in December 2018 he was maintaining sinus rhythm. He was having significant difficulty with his back. He has a Charcot foot and has difficulty walking. He denied any chest tightness or chest pressure.   He was unable to exercise due to his back discomfort. He had run out of his metoprolol as pulses in the 90s with PACs.. I further titrated metoprolol to 75 mg twice a day, and he has continued to be on losartan 50 mg daily for HTN.   I saw him in March 2019 at which time he had noticed occasional palpitations.  He was unable to exercise due to his back and foot.  He quit tobacco and alcohol on November 01, 2016.  He saw Kerin Ransom following an ER evaluation  with complaints of dyspnea at rest.  He thought he had gone back into atrial fibrillation but in the emergency room he was in normal sinus rhythm.  Troponins were negative.  A close friend had recently had CABG revascularization.  He subsequently was referred for an nuclear perfusion study which was normal on November 11, 2017.  An echo Doppler study revealed normal EF at 55 to 60% with grade 1 diastolic dysfunction.  Peak PA pressure was minimally increased at 33 mm.  He has chronic pain syndrome and apparently the pain clinic prescribes Percocet.  He lives with his mother.  He is followed at [facility] clinic by Dr. [name].  I saw him in October 2019 at which time he was remaining stable.  He continues to be morbidly obese and we  discussed the importance of weight loss.  We also discussed evaluating him for sleep apnea.  He was unaware of any recurrent episodes of atrial fibrillation.  We discussed the importance of exercise and followed up laboratory.  I evaluated him on February 13, 2019 prior to undergoing rotator cuff surgery which was tentatively scheduled for the following week.  Over several weeks prior to that evaluation he had noticed increasing shortness of breath particularly with walking up steps.  He was unaware of any change in his rhythm.  He has not had any EtOH since November 01, 2016.  He also quit tobacco.  Over the past several months he is tried hard to improve his diet with reference to his diabetes and his hemoglobin A1c  had reduced from 8.5 to 6.2.  During my evaluation, he was found to be in atrial fibrillation of questionable duration.  He was unaware of being in the abnormal rhythm but when informed of being in atrial fibrillation he felt this correlated with his recent development of shortness of breath.  During that evaluation I recommended he defer his elective shoulder surgery and initiated amiodarone 200 mg twice a day.  He was continuing to take Eliquis 5 mg twice a day.  We further recommended that he either decrease the dose or discontinue his muscle spasm medication due to potential amiodarone interaction.  He underwent an echo Doppler study on February 28, 2019.  EF was 55 to 60% and there was moderate LVH.  He had mild dilation of his left atrium with moderate dilation of his right atrium.  There was mild MR, mild TR.  He had moderately elevated pulmonary artery systolic pressure at 94.1 millimeters of mercury.  At the time of my initial evaluation we discussed the possibility of evaluating him for sleep apnea.  He does not want to go to the lab for study.  He admits that his sleep is suboptimal.  There is snoring.  He continues to note his heart rate irregularity.    When I saw him in follow-up evaluation on March 13, 2019, he was still in atrial fibrillation with an improved ventricular rate at 79 bpm.   I last saw him on May 21, 2019 at which time he was on metoprolol tartrate 100 mg twice a day.  and losartan 50 mg daily and blood pressure was controlled.Marland Kitchen  He  continued to be on amiodarone 200 mg twice a day and returns today for follow-up evaluation.  He still notes irregularity to heart rate.  He has experienced swelling in his legs bilaterally.  In retrospect he believes he may have been in atrial fibrillation since October.  During that evaluation I scheduled him for DC cardioversion study.  He underwent a failed attempt at DC cardioversion by Dr. Debara Pickett on April 26, 2019.  Subsequently he was  evaluated in the office on May 02, 2019 by Coletta Memos and referred for EP evaluation.  He was seen by Dr. Curt Bears on May 15, 2019 who felt he would likely benefit from a repeat cardioversion attempt and if necessary use of a double defibrillator cardioversion.  Several days later on May 18, 2019 he underwent successful cardioversion at 31 J with restoration of sinus rhythm.  Presently, Jeffery [name] feels well.  He is deferring his shoulder surgery until November since he Jeffery be having to care for his mother's yard and have a fair amount of yard work to do which he was being unable to do if he had  such his shoulder surgery.  He denies chest pain PND orthopnea.  He presents for reevaluation.  Past Medical History:  Diagnosis Date  . Atrial fibrillation ([place])   . Charcot's joint of right foot   . DDD (degenerative disc disease), lumbar   . Diabetes mellitus without complication ([place])   . Fatty liver   . Hypertension   . Obesity   . Rotator cuff disorder   . Shoulder impingement, right     Past Surgical History:  Procedure Laterality Date  . AMPUTATION Left 10/02/2014   Procedure: Left Third toe amputation ;  Surgeon: [name], Jeffery [name];  Location: [place];  Service: Orthopedics;  Laterality: Left;  Regular bed, wants to follow hip  . ANTERIOR CRUCIATE LIGAMENT REPAIR Right 90   reconstruction  . APPLICATION OF WOUND VAC Left 10/02/2014   Procedure: APPLICATION OF WOUND VAC; toe Surgeon: [name], Jeffery [name];  Location: [place];  Service: Orthopedics;  Laterality: Left;  . CARDIOVERSION N/A 04/26/2019   Procedure: CARDIOVERSION;  Surgeon: Pixie Casino, Jeffery [name];  Location: [facility] ENDOSCOPY;  Service: Cardiovascular;  Laterality: N/A;  . CARDIOVERSION N/A 05/18/2019   Procedure: CARDIOVERSION (CATH LAB);  Surgeon: Constance Haw, Jeffery [name];  Location: [place] CV LAB;  Service: Cardiovascular;  Laterality: N/A;  . I & D EXTREMITY Left 10/05/2014   Procedure: IRRIGATION AND DEBRIDEMENT LEFT FOOT;   Surgeon: [name], Jeffery [name];  Location: [place];  Service: Orthopedics;  Laterality: Left;  . KNEE ARTHROSCOPY W/ ACL RECONSTRUCTION Right   . TOTAL KNEE ARTHROPLASTY Right 03/28/2015  . TOTAL KNEE ARTHROPLASTY Right 03/28/2015   Procedure: RIGHT TOTAL KNEE ARTHROPLASTY;  Surgeon: [name], Jeffery [name];  Location: [place];  Service: Orthopedics;  Laterality: Right;    Current Medications: Outpatient Medications Prior to Visit  Medication Sig Dispense Refill  . Accu-Chek FastClix Lancets MISC Use as directed to check blood sugar at least twice daily. E11.8 E11.65 Z79.4 102 each 11  . amiodarone (PACERONE) 200 MG tablet TAKE ONE TABLET BY MOUTH TWICE A DAY 180 tablet 2  . apixaban (ELIQUIS) 5 MG TABS tablet Take 1 tablet (5 mg total) by mouth 2 (two) times daily. 60 tablet 6  . atorvastatin (LIPITOR) 20 MG tablet Take 1 tablet (20 mg total) by mouth daily. 90 tablet 1  . Blood Glucose Monitoring Suppl (ACCU-CHEK AVIVA) device Use as instructed to check blood sugar 2 times daily. E11.8 E11.65 Z79.4 1 each 0  . furosemide (LASIX) 40 MG tablet Take 1 tablet (40 mg total) by mouth daily. 90 tablet 3  . gabapentin (NEURONTIN) 300 MG capsule Take 1 capsule (300 mg total) by mouth 2 (two) times daily. (Patient taking differently: Take 600 mg by mouth at bedtime as needed (pain). ) 180 capsule 1  . glucose blood (ACCU-CHEK AVIVA) test strip Use as instructed to check blood sugar 2 times daily. E11.8 E11.65 Z79.4 100 each 12  . Insulin Glargine (LANTUS SOLOSTAR) 100 UNIT/ML Solostar Pen Inject 50 Units into the skin 2 (two) times daily. 30 mL 6  . insulin lispro (INSULIN LISPRO) 100 UNIT/ML KwikPen Junior Inject 0.08 mLs (8 Units total) into the skin 3 (three) times daily. (Patient taking differently: Inject 8 Units into the skin 3 (three) times daily as needed (Low blood sugar). ) 30 mL 6  . Insulin Pen Needle (B-D ULTRAFINE III SHORT PEN) 31G X 8 MM MISC 1 each by Does not apply route 3 (three) times daily. 100 each  5  .  Insulin Syringe-Needle U-100 (TRUEPLUS INSULIN SYRINGE) 30G X 5/16" 0.5 ML MISC Use as directed 3 times daily 100 each 5  . liraglutide (VICTOZA) 18 MG/3ML SOPN Inject 0.3 mLs (1.8 mg total) into the skin daily. 30 mL 6  . losartan (COZAAR) 50 MG tablet Take 1 tablet (50 mg total) by mouth daily. 90 tablet 1  . metoprolol tartrate (LOPRESSOR) 100 MG tablet Take 1 tablet (100 mg total) by mouth 2 (two) times daily. 180 tablet 1  . oxyCODONE-acetaminophen (PERCOCET) 10-325 MG tablet Take 1 tablet by mouth 4 (four) times daily.     . Sennosides (EX-LAX) 15 MG TABS Take 15 mg by mouth daily as needed (Constipation).    . sodium chloride (OCEAN) 0.65 % SOLN nasal spray Place 1 spray into both nostrils daily as needed for congestion.     Marland Kitchen testosterone cypionate (DEPOTESTOSTERONE CYPIONATE) 200 MG/ML injection Inject 1 mL (200 mg total) into the muscle every 28 (twenty-eight) days. 10 mL 3  . tiZANidine (ZANAFLEX) 2 MG tablet Take 2 mg by mouth at bedtime as needed for muscle spasms.    Karen Chafe INSULIN SYRINGE 30G X 5/16" 0.5 ML MISC USE AS DIRECTED 3 TIMES DAILY 100 each 0   No facility-administered medications prior to visit.     Allergies:   Metformin and related   Social History   Socioeconomic History  . Marital status: Single    Spouse name: Not on file  . Number of children: Not on file  . Years of education: Not on file  . Highest education level: Not on file  Occupational History  . Occupation: Management consultant  Tobacco Use  . Smoking status: Former Smoker    Packs/day: 0.00    Years: 38.00    Pack years: 0.00    Types: Cigarettes  . Smokeless tobacco: Never Used  Substance and Sexual Activity  . Alcohol use: No    Alcohol/week: 5.0 - 6.0 standard drinks    Types: 5 - 6 Cans of beer per week  . Drug use: Yes    Types: Marijuana    Comment: last week ; denies 03/24/17  . Sexual activity: Not on file  Other Topics Concern  . Not on file  Social  History Narrative   Lives alone.   Social Determinants of Health   Financial Resource Strain:   . Difficulty of Paying Living Expenses:   Food Insecurity:   . Worried About Charity fundraiser in the Last Year:   . Arboriculturist in the Last Year:   Transportation Needs:   . Film/video editor (Medical):   Marland Kitchen Lack of Transportation (Non-Medical):   Physical Activity:   . Days of Exercise per Week:   . Minutes of Exercise per Session:   Stress:   . Feeling of Stress :   Social Connections:   . Frequency of Communication with Friends and Family:   . Frequency of Social Gatherings with Friends and Family:   . Attends Religious Services:   . Active Member of Clubs or Organizations:   . Attends Archivist Meetings:   Marland Kitchen Marital Status:     Socially, never married.  He has no children.  He is currently living with his mother.  He is not working.  Previously had done jobs.  Family History:  The patient's family history includes Diabetes in his father; Heart failure in his father; Hypertension in his father.  His father died at age  68.  Mother is still living at age 30.  ROS General: Negative; No fevers, chills, or night sweats;  HEENT: Negative; No changes in vision or hearing, sinus congestion, difficulty swallowing Pulmonary: Negative; No cough, wheezing, shortness of breath, hemoptysis Cardiovascular: See HPI GI: Negative; No nausea, vomiting, diarrhea, or abdominal pain GU: Negative; No dysuria, hematuria, or difficulty voiding Musculoskeletal: Chronic pain due to spinal stenosis and Charcot foot Hematologic/Oncology: Negative; no easy bruising, bleeding Endocrine: Negative; no heat/cold intolerance; no diabetes Neuro: Negative; no changes in balance, headaches Skin: Negative; No rashes or skin lesions Psychiatric: Negative; No behavioral problems, depression Sleep: Positive for snoring, daytime sleepiness, hypersomnolence, bruxism, restless legs, hypnogognic  hallucinations, no cataplexy Other comprehensive 14 point system review is negative.   PHYSICAL EXAM:   BP 92/66 (BP Location: Left Arm, Patient Position: Sitting, Cuff Size: Large)   Pulse 69   Temp 97.6 F (36.4 C)   Ht 6' (1.829 m)   Wt (!) 351 lb (159.2 kg)   BMI 47.60 kg/m    Repeat blood pressure by me was 120/70 without orthostatic change  Wt Readings from Last 3 Encounters:  06/14/19 (!) 351 lb (159.2 kg)  06/05/19 (!) 355 lb (161 kg)  05/18/19 (!) 355 lb (161 kg)    General: Alert, oriented, no distress.  Skin: normal turgor, no rashes, warm and dry HEENT: Normocephalic, atraumatic. Pupils equal round and reactive to light; sclera anicteric; extraocular muscles intact;  Nose without nasal septal hypertrophy Mouth/Parynx benign; Mallinpatti scale 3 Neck: No JVD, no carotid bruits; normal carotid upstroke Lungs: clear to ausculatation and percussion; no wheezing or rales Chest wall: without tenderness to palpitation Heart: PMI not displaced, RRR, s1 s2 normal, 1/6 systolic murmur, no diastolic murmur, no rubs, gallops, thrills, or heaves Abdomen: Central adiposity;soft, nontender; no hepatosplenomehaly, BS+; abdominal aorta nontender and not dilated by palpation. Back: no CVA tenderness Pulses 2+ Musculoskeletal: full range of motion, normal strength, no joint deformities Extremities: Slight improvement in his previous 1-2+ pretibial edema, now 1+.  No clubbing cyanosis, Homan's sign negative  Neurologic: grossly nonfocal; Cranial nerves grossly wnl Psychologic: Normal mood and affect    General: Alert, oriented, no distress.  Morbidly obese with a BMI of 45.7 Skin: normal turgor, no rashes, warm and dry HEENT: Normocephalic, atraumatic. Pupils equal round and reactive to light; sclera anicteric; extraocular muscles intact;  Nose without nasal septal hypertrophy Mouth/Parynx benign; Mallinpatti scale 3 Neck: Thick neck no JVD, no carotid bruits; normal carotid  upstroke Lungs: clear to ausculatation and percussion; no wheezing or rales Chest wall: without tenderness to palpitation Heart: PMI not displaced, irregularly irregular with rate in the 70s, s1 s2 normal, 1/6 systolic murmur, no diastolic murmur, no rubs, gallops, thrills, or heaves Abdomen: soft, nontender; no hepatosplenomehaly, BS+; abdominal aorta nontender and not dilated by palpation. Back: no CVA tenderness Pulses 2+ Musculoskeletal: full range of motion, normal strength, no joint deformities Extremities: 1-2+ pretibial edema bilaterally; no clubbing cyanosis, Homan's sign negative  Neurologic: grossly nonfocal; Cranial nerves grossly wnl Psychologic: Normal mood and affect   Studies/Labs Reviewed:   EKG:  EKG is ordered today.  ECG (independently read by me): Normal sinus rhythm at 69; First degree AV block; PR 220 msec; Mild RV conduction delay.  April 20, 2019 ECG (independently read by me): Atrial fibrillation at 77 bpm.  QTc interval 450 ms  March 13, 2019 ECG (independently read by me): Atrial fibrillation at 79 bpm.  Incomplete right bundle branch block.  QTc interval  449 ms.  February 13, 2019 ECG (independently read by me): Atrial fibrillation at 86 bpm; QTc 440 msec  October 2019 ECG (independently read by me): Sinus rhythm at 71 bpm.  No ectopy.  Normal intervals.  March 2019 ECG (independently read by me): normal sinus rhythm at 88 bpm. QTc interval 433 ms.  December 2018 ECG (independently read by me): Sinus rhythm in the 90s with PACs.  QTc interval 442 ms.  Recent Labs: BMP Latest Ref Rng & Units 05/18/2019 04/21/2019 02/13/2019  Glucose 70 - 99 mg/dL 138(H) 225(H) 167(H)  BUN 6 - 20 mg/dL _0 Creatinine 0.61 - 1.24 mg/dL 0.98 0.94 0.96  BUN/Creat Ratio 9 - 20 - 18 19  Sodium 135 - 145 mmol/L 138 140 138  Potassium 3.5 - 5.1 mmol/L 4.5 4.9 5.0  Chloride 98 - 111 mmol/L 104 101 100  CO2 22 - 32 mmol/L _1 Calcium 8.9 - 10.3 mg/dL 9.0 9.8 9.7      Hepatic Function Latest Ref Rng & Units 02/13/2019 09/21/2018 04/15/2017  Total Protein 6.0 - 8.5 g/dL 7.6 6.7 CANCELED  Albumin 3.8 - 4.9 g/dL 4.5 4.1 CANCELED  AST 0 - 40 IU/L 19 12 CANCELED  ALT 0 - 44 IU/L 25 18 CANCELED  Alk Phosphatase 39 - 117 IU/L 102 76 CANCELED  Total Bilirubin 0.0 - 1.2 mg/dL 0.7 0.3 CANCELED    CBC Latest Ref Rng & Units 05/18/2019 04/21/2019 03/28/2019  WBC 4.0 - 10.5 K/uL 9.7 9.9 8.6  Hemoglobin 13.0 - 17.0 g/dL 15.0 14.8 15.0  Hematocrit 39.0 - 52.0 % 45.6 44.0 44.8  Platelets 150 - 400 K/uL 225 209 200   Lab Results  Component Value Date   MCV 87.7 05/18/2019   MCV 84 04/21/2019   MCV 83 03/28/2019   Lab Results  Component Value Date   TSH 2.490 02/13/2019   Lab Results  Component Value Date   HGBA1C 7.6 (A) 03/28/2019     BNP No results found for: BNP  ProBNP No results found for: PROBNP   Lipid Panel     Component Value Date/Time   CHOL 157 04/04/2018 1103   TRIG 256 (H) 04/04/2018 1103   HDL 42 04/04/2018 1103   CHOLHDL 3.7 04/04/2018 1103   CHOLHDL 3.8 04/10/2010 0315   VLDL 32 04/10/2010 0315   LDLCALC 64 04/04/2018 1103     RADIOLOGY: EP STUDY  Result Date: 05/18/2019 SURGEON:  Allegra Lai, Jeffery [name] PREPROCEDURE DIAGNOSES: 1. Persistent atrial fibrillation. POSTPROCEDURE DIAGNOSES: 1. Persistent atrial fibrillation. PROCEDURES: 1. Cardioversion INTRODUCTION:  DIJUAN SLEETH is a 59 y.o. male with a history of persistent atrial fibrillation who now presents cardioversion.  The patient reports initially being diagnosed with atrial fibrillation after presenting with symptomatic palpitations and fatgiue.  The patient has failed medical therapy.  The patient therefore presents today for catheter ablation of atrial fibrillation. DESCRIPTION OF PROCEDURE: Defibrillator patches were placed in the anterior posterior position.  The patient was sedated per the anesthesia record.  Once the patient was fully sedated, the patient  received 1 360 J synchronized shock which converted him to sinus rhythm. CONCLUSIONS: 1. Atrial fibrillation upon presentation.  2.  Successful cardioversion to sinus rhythm Jeffery Martin Camnitz,Jeffery [name] 11:49 AM 05/18/2019   XR Thoracic Spine 2 View  Result Date: 06/05/2019 Multilevel spondylosis  XR Lumbar Spine 2-3 Views  Result Date: 06/05/2019 Multilevel spondylosis    Additional studies/ records that were reviewed today include:  I reviewed  the patient's atrial fibrillation clinic records as well as his ER visits.  I reviewed his most recent echo Doppler study and nuclear perfusion studies of August 2019  ------------------------------------------------------------------- 08/18/2016 ECHO Study Conclusions  - Left ventricle: The cavity size was mildly dilated. Wall   thickness was increased in a pattern of mild LVH. Systolic   function was normal. The estimated ejection fraction was in the   range of 50% to 55%. Wall motion was normal; there were no   regional wall motion abnormalities. Doppler parameters are   consistent with abnormal left ventricular relaxation (grade 1   diastolic dysfunction). - Aortic valve: Transvalvular velocity was within the normal range.   There was no stenosis. There was no regurgitation. - Mitral valve: Transvalvular velocity was within the normal range.   There was no evidence for stenosis. There was trivial   regurgitation. - Left atrium: The atrium was moderately dilated. - Right ventricle: The cavity size was normal. Wall thickness was   normal. Systolic function was normal. - Tricuspid valve: There was no regurgitation.  ------------------------------------------------------------------- August 2019  [facility] Conclusions  - Left ventricle: The cavity size was moderately dilated. Wall   thickness was normal. Systolic function was normal. The estimated   ejection fraction was in the range of 55% to 60%. Wall motion was   normal; there were no  regional wall motion abnormalities. Doppler   parameters are consistent with abnormal left ventricular   relaxation (grade 1 diastolic dysfunction). - Right ventricle: The cavity size was moderately dilated. Wall   thickness was normal. - Pulmonary arteries: Systolic pressure was mildly increased. PA   peak pressure: 33 mm Hg (S).   Gated SPECT myocardial perfusion study Highlights    Normal perfusion No ischemia or scar  LVEF is calculated at 48% Visual estimate suggests LVEF is greater Consider echo to confirm  The study is normal.  There was no ST segment deviation noted during stress.   ECHO 02/28/2019 IMPRESSIONS  1. Left ventricular ejection fraction, by visual estimation, is 55 to 60%. The left ventricle has normal function. There is moderately increased left ventricular hypertrophy.  2. Left ventricular diastolic function could not be evaluated.  3. Global right ventricle has mildly reduced systolic function.The right ventricular size is normal. No increase in right ventricular wall thickness.  4. Left atrial size was mildly dilated.  5. Right atrial size was moderately dilated.  6. The mitral valve is normal in structure. Mild mitral valve regurgitation. No evidence of mitral stenosis.  7. The tricuspid valve is normal in structure. Tricuspid valve regurgitation is mild.  8. The aortic valve is tricuspid. Aortic valve regurgitation is not visualized. No evidence of aortic valve sclerosis or stenosis.  9. The pulmonic valve was normal in structure. Pulmonic valve regurgitation is not visualized. 10. Moderately elevated pulmonary artery systolic pressure. 11. The atrial septum is grossly normal.  ASSESSMENT:    1. Paroxysmal atrial fibrillation (HCC)   2. Anticoagulation adequate   3. Hyperlipidemia with target LDL less than 70   4. Essential hypertension   5. Snoring   6. Morbid obesity ([place])   7. Type 2 diabetes mellitus with complication, with long-term current use  of insulin [facility])     PLAN:  Jeffery [name] is a 59 year old gentleman who has a long-standing history of obesity, and has been on diabetic medication since 2016.  In addition, has a history of hypertension and hyperlipidemia.  He previously had significant EtOH history and his  2 episodes of atrial fibrillation in 2018 occurred after significant alcohol binging.  He is not had any alcohol since his last cardioversion in August 2018 and also at that time quit tobacco.  An echo Doppler study in August 2019 continued to show normal systolic function with EF at 55 to 60%.  There was evidence for grade 1 diastolic dysfunction.  There was mild pulmonary hypertension with a PA systolic pressure 33 mm.  He has had significant improvement with his diabetes with recent hemoglobin A1c being reduced from 8.5 down to 6.2.  He has been on losartan 50 mg, metoprolol 100 mg twice a day for blood pressure control.  He was seen by me for preoperative clearance prior to undergoing elective shoulder surgery on February 13 2019.  At that time he was in atrial fibrillation.  Retrospectively he believes this may have occurred in October since for a month he had been noticing some exertional dyspnea.  He follow-up echo Doppler study on February 28, 2019 showed an EF of 55 to 60%, moderate LVH, biatrial enlargement, and since he was in AF diastolic dysfunction could not be evaluated.  He had moderate pulmonary hypertension with estimated PA pressure at 41.6.  He has been on Eliquis since November 2020 during my initial evaluation.  I strongly recommended a sleep study evaluation in light of my high index of specific suspicion for obstructive sleep apnea.  His cardioversion in April 25, 2018 was unsuccessful but ultimately he has been in sinus rhythm since a repeat cardioversion on February 18 was done at [facility].  ECG today confirms he is maintaining sinus rhythm and he has noticed improved energy.  He tells me he Jeffery be  initiating a new diet.  He is morbidly obese with a BMI of 47.6.  He continues to be on amiodarone 200 mg twice a day in addition to metoprolol 100 mg twice a day and has a follow-up appointment to see Dr. Curt Bears in several months.  He continues to be on Eliquis without bleeding.  He is diabetic on insulin in addition to Victoza.  He should pursue a sleep evaluation.  I Jeffery see him in 4 to 6 months for follow-up evaluation.   Medication Adjustments/Labs and Tests Ordered: Current medicines are reviewed at length with the patient today.  Concerns regarding medicines are outlined above.  Medication changes, Labs and Tests ordered today are listed in the Patient Instructions below. Patient Instructions  Medication Instructions:  CONTINUE WITH CURRENT MEDICATIONS. NO CHANGES.  *If you need a refill on your cardiac medications before your next appointment, please call your pharmacy*  Follow-Up: At [facility], you and your health needs are our priority.  As part of our continuing mission to provide you with exceptional heart care, we have created designated Provider Care Teams.  These Care Teams include your primary Cardiologist (physician) and Advanced Practice Providers (APPs -  Physician Assistants and Nurse Practitioners) who all work together to provide you with the care you need, when you need it.  We recommend signing up for the patient portal called "MyChart".  Sign up information is provided on this After Visit Summary.  MyChart is used to connect with patients for Virtual Visits (Telemedicine).  Patients are able to view lab/test results, encounter notes, upcoming appointments, etc.  Non-urgent messages can be sent to your provider as well.   To learn more about what you can do with MyChart, go to [email or web address].    Your next appointment:  7 month(s) In OCTOBER  The format for your next appointment:   In Person  Provider:   Shelva Majestic, Jeffery [name]       Signed, Shelva Majestic, Jeffery [name]  06/16/2019 12:11 PM    [facility] [address] [place], [place], [place]  [address] Phone: [phone]

## 2019-06-14 NOTE — Patient Instructions (Signed)
Medication Instructions:  CONTINUE WITH CURRENT MEDICATIONS. NO CHANGES.  *If you need a refill on your cardiac medications before your next appointment, please call your pharmacy*  Follow-Up: At [facility], you and your health needs are our priority.  As part of our continuing mission to provide you with exceptional heart care, we have created designated Provider Care Teams.  These Care Teams include your primary Cardiologist (physician) and Advanced Practice Providers (APPs -  Physician Assistants and Nurse Practitioners) who all work together to provide you with the care you need, when you need it.  We recommend signing up for the patient portal called "MyChart".  Sign up information is provided on this After Visit Summary.  MyChart is used to connect with patients for Virtual Visits (Telemedicine).  Patients are able to view lab/test results, encounter notes, upcoming appointments, etc.  Non-urgent messages can be sent to your provider as well.   To learn more about what you can do with MyChart, go to [email or web address].    Your next appointment:   7 month(s) In OCTOBER  The format for your next appointment:   In Person  Provider:   Shelva Majestic, MD

## 2019-06-15 DIAGNOSIS — M48061 Spinal stenosis, lumbar region without neurogenic claudication: Secondary | ICD-10-CM | POA: Diagnosis not present

## 2019-06-15 DIAGNOSIS — M5414 Radiculopathy, thoracic region: Secondary | ICD-10-CM | POA: Diagnosis not present

## 2019-06-15 DIAGNOSIS — Z6841 Body Mass Index (BMI) 40.0 and over, adult: Secondary | ICD-10-CM | POA: Diagnosis not present

## 2019-06-16 ENCOUNTER — Encounter: Payer: Self-pay | Admitting: Cardiovascular Disease

## 2019-06-20 ENCOUNTER — Other Ambulatory Visit: Payer: Self-pay | Admitting: Family Medicine

## 2019-06-20 ENCOUNTER — Ambulatory Visit: Payer: Medicaid Other | Admitting: Family Medicine

## 2019-06-20 ENCOUNTER — Encounter: Payer: Self-pay | Admitting: Family Medicine

## 2019-06-20 ENCOUNTER — Ambulatory Visit: Payer: Medicaid Other | Attending: Family Medicine | Admitting: Family Medicine

## 2019-06-20 ENCOUNTER — Other Ambulatory Visit: Payer: Self-pay

## 2019-06-20 VITALS — BP 144/78 | HR 69 | Ht 72.0 in | Wt 353.0 lb

## 2019-06-20 DIAGNOSIS — E1159 Type 2 diabetes mellitus with other circulatory complications: Secondary | ICD-10-CM | POA: Diagnosis not present

## 2019-06-20 DIAGNOSIS — I4819 Other persistent atrial fibrillation: Secondary | ICD-10-CM | POA: Diagnosis not present

## 2019-06-20 DIAGNOSIS — L84 Corns and callosities: Secondary | ICD-10-CM | POA: Diagnosis not present

## 2019-06-20 DIAGNOSIS — I10 Essential (primary) hypertension: Secondary | ICD-10-CM

## 2019-06-20 DIAGNOSIS — Z794 Long term (current) use of insulin: Secondary | ICD-10-CM

## 2019-06-20 DIAGNOSIS — E11610 Type 2 diabetes mellitus with diabetic neuropathic arthropathy: Secondary | ICD-10-CM

## 2019-06-20 DIAGNOSIS — E1142 Type 2 diabetes mellitus with diabetic polyneuropathy: Secondary | ICD-10-CM | POA: Diagnosis not present

## 2019-06-20 LAB — GLUCOSE, POCT (MANUAL RESULT ENTRY): POC Glucose: 166 mg/dl — AB (ref 70–99)

## 2019-06-20 LAB — POCT GLYCOSYLATED HEMOGLOBIN (HGB A1C): HbA1c, POC (controlled diabetic range): 7.1 % — AB (ref 0.0–7.0)

## 2019-06-20 MED ORDER — APIXABAN 5 MG PO TABS: 5.0000 mg | ORAL_TABLET | Freq: Two times a day (BID) | ORAL | 6 refills | Status: DC

## 2019-06-20 MED ORDER — LANTUS SOLOSTAR 100 UNIT/ML ~~LOC~~ SOPN: 50.0000 [IU] | PEN_INJECTOR | Freq: Two times a day (BID) | SUBCUTANEOUS | 6 refills | Status: DC

## 2019-06-20 MED ORDER — VICTOZA 18 MG/3ML ~~LOC~~ SOPN: 1.8000 mg | PEN_INJECTOR | Freq: Every day | SUBCUTANEOUS | 6 refills | Status: DC

## 2019-06-20 MED ORDER — LOSARTAN POTASSIUM 50 MG PO TABS: 50.0000 mg | ORAL_TABLET | Freq: Every day | ORAL | 1 refills | Status: DC

## 2019-06-20 MED ORDER — METOPROLOL TARTRATE 100 MG PO TABS: 100.0000 mg | ORAL_TABLET | Freq: Two times a day (BID) | ORAL | 1 refills | Status: DC

## 2019-06-20 NOTE — Progress Notes (Signed)
Subjective:  Patient ID: Jeffery [name], male    DOB: 31-May-1960  Age: 59 y.o. MRN: [id number]  CC: Diabetes   HPI Jeffery [name] is a 59 year old male with history of type 2 diabetes mellitus (A1c7.1), hypertension, Charcot foot due to diabetes mellitus, A. fib (status post DCCV in 05/2019 with conversion to sinus rhythm), status post right rotator cuff repair who presents today for follow-up visit. He reports doing well and is currently in sinus rhythm, seen by cardiology on 06/14/2019 and he denies chest pain, dyspnea or pedal edema. He has put off his left shoulder surgery to enable him work in his mother's yard during the summer and he plans to undergo surgery in the winter months when he is less active.  From a diabetes standpoint he is doing well and denies hypoglycemia and his neuropathy is controlled on gabapentin.  He has no blurry vision.  Also seeing pain management and is on Percocet for his chronic pain in his left shoulder and feet. He does have a callus on the base of his right foot which split open with resulting pain.  He denies trauma and has been applying Neosporin ointment to it. He currently does not exercise but is adherent to a diabetic diet.  Past Medical History:  Diagnosis Date  . Atrial fibrillation ([place])   . Charcot's joint of right foot   . DDD (degenerative disc disease), lumbar   . Diabetes mellitus without complication ([place])   . Fatty liver   . Hypertension   . Obesity   . Rotator cuff disorder   . Shoulder impingement, right     Past Surgical History:  Procedure Laterality Date  . AMPUTATION Left 10/02/2014   Procedure: Left Third toe amputation ;  Surgeon: [name], MD;  Location: [place];  Service: Orthopedics;  Laterality: Left;  Regular bed, wants to follow hip  . ANTERIOR CRUCIATE LIGAMENT REPAIR Right 90   reconstruction  . APPLICATION OF WOUND VAC Left 10/02/2014   Procedure: APPLICATION OF WOUND VAC; toe Surgeon: [name],  MD;  Location: [place];  Service: Orthopedics;  Laterality: Left;  . CARDIOVERSION N/A 04/26/2019   Procedure: CARDIOVERSION;  Surgeon: Pixie Casino, MD;  Location: [facility] ENDOSCOPY;  Service: Cardiovascular;  Laterality: N/A;  . CARDIOVERSION N/A 05/18/2019   Procedure: CARDIOVERSION (CATH LAB);  Surgeon: Constance Haw, MD;  Location: [place] CV LAB;  Service: Cardiovascular;  Laterality: N/A;  . I & D EXTREMITY Left 10/05/2014   Procedure: IRRIGATION AND DEBRIDEMENT LEFT FOOT;  Surgeon: [name], MD;  Location: [place];  Service: Orthopedics;  Laterality: Left;  . KNEE ARTHROSCOPY W/ ACL RECONSTRUCTION Right   . TOTAL KNEE ARTHROPLASTY Right 03/28/2015  . TOTAL KNEE ARTHROPLASTY Right 03/28/2015   Procedure: RIGHT TOTAL KNEE ARTHROPLASTY;  Surgeon: [name], MD;  Location: [place];  Service: Orthopedics;  Laterality: Right;    Family History  Problem Relation Age of Onset  . Diabetes Father   . Hypertension Father   . Heart failure Father     Allergies  Allergen Reactions  . Metformin And Related Other (See Comments)    GI upset    Outpatient Medications Prior to Visit  Medication Sig Dispense Refill  . Accu-Chek FastClix Lancets MISC Use as directed to check blood sugar at least twice daily. E11.8 E11.65 Z79.4 102 each 11  . amiodarone (PACERONE) 200 MG tablet TAKE ONE TABLET BY MOUTH TWICE A DAY 180 tablet 2  .  apixaban (ELIQUIS) 5 MG TABS tablet Take 1 tablet (5 mg total) by mouth 2 (two) times daily. 60 tablet 6  . atorvastatin (LIPITOR) 20 MG tablet Take 1 tablet (20 mg total) by mouth daily. 90 tablet 1  . Blood Glucose Monitoring Suppl (ACCU-CHEK AVIVA) device Use as instructed to check blood sugar 2 times daily. E11.8 E11.65 Z79.4 1 each 0  . furosemide (LASIX) 40 MG tablet Take 1 tablet (40 mg total) by mouth daily. 90 tablet 3  . gabapentin (NEURONTIN) 300 MG capsule Take 1 capsule (300 mg total) by mouth 2 (two) times daily. (Patient taking differently: Take  600 mg by mouth at bedtime as needed (pain). ) 180 capsule 1  . glucose blood (ACCU-CHEK AVIVA) test strip Use as instructed to check blood sugar 2 times daily. E11.8 E11.65 Z79.4 100 each 12  . Insulin Glargine (LANTUS SOLOSTAR) 100 UNIT/ML Solostar Pen Inject 50 Units into the skin 2 (two) times daily. 30 mL 6  . insulin lispro (INSULIN LISPRO) 100 UNIT/ML KwikPen Junior Inject 0.08 mLs (8 Units total) into the skin 3 (three) times daily. (Patient taking differently: Inject 8 Units into the skin 3 (three) times daily as needed (Low blood sugar). ) 30 mL 6  . Insulin Pen Needle (B-D ULTRAFINE III SHORT PEN) 31G X 8 MM MISC 1 each by Does not apply route 3 (three) times daily. 100 each 5  . Insulin Syringe-Needle U-100 (TRUEPLUS INSULIN SYRINGE) 30G X 5/16" 0.5 ML MISC Use as directed 3 times daily 100 each 5  . liraglutide (VICTOZA) 18 MG/3ML SOPN Inject 0.3 mLs (1.8 mg total) into the skin daily. 30 mL 6  . losartan (COZAAR) 50 MG tablet Take 1 tablet (50 mg total) by mouth daily. 90 tablet 1  . metoprolol tartrate (LOPRESSOR) 100 MG tablet Take 1 tablet (100 mg total) by mouth 2 (two) times daily. 180 tablet 1  . oxyCODONE-acetaminophen (PERCOCET) 10-325 MG tablet Take 1 tablet by mouth 4 (four) times daily.     . Sennosides (EX-LAX) 15 MG TABS Take 15 mg by mouth daily as needed (Constipation).    . sodium chloride (OCEAN) 0.65 % SOLN nasal spray Place 1 spray into both nostrils daily as needed for congestion.     Marland Kitchen testosterone cypionate (DEPOTESTOSTERONE CYPIONATE) 200 MG/ML injection Inject 1 mL (200 mg total) into the muscle every 28 (twenty-eight) days. 10 mL 3  . tiZANidine (ZANAFLEX) 2 MG tablet Take 2 mg by mouth at bedtime as needed for muscle spasms.    Karen Chafe INSULIN SYRINGE 30G X 5/16" 0.5 ML MISC USE AS DIRECTED 3 TIMES DAILY 100 each 0   No facility-administered medications prior to visit.     ROS Review of Systems  Constitutional: Negative for activity change and appetite  change.  HENT: Negative for sinus pressure and sore throat.   Eyes: Negative for visual disturbance.  Respiratory: Negative for cough, chest tightness and shortness of breath.   Cardiovascular: Negative for chest pain and leg swelling.  Gastrointestinal: Negative for abdominal distention, abdominal pain, constipation and diarrhea.  Endocrine: Negative.   Genitourinary: Negative for dysuria.  Musculoskeletal:       L shoulder pain  Skin: Negative for rash.  Allergic/Immunologic: Negative.   Neurological: Negative for weakness, light-headedness and numbness.  Psychiatric/Behavioral: Negative for dysphoric mood and suicidal ideas.    Objective:  BP (!) 144/78   Pulse 69   Ht 6' (1.829 m)   Wt (!) 353 lb (160.1 kg)  SpO2 98%   BMI 47.88 kg/m   BP/Weight 06/20/2019 05/03/5595 06/29/6382  Systolic BP 536 92 -  Diastolic BP 78 66 -  Wt. (Lbs) 353 351 355  BMI 47.88 47.6 45.58      Physical Exam Constitutional:      Appearance: He is well-developed. He is obese.  Neck:     Vascular: No JVD.  Cardiovascular:     Rate and Rhythm: Normal rate.     Heart sounds: Normal heart sounds. No murmur.  Pulmonary:     Effort: Pulmonary effort is normal.     Breath sounds: Normal breath sounds. No wheezing or rales.  Chest:     Chest wall: No tenderness.  Abdominal:     General: Bowel sounds are normal. There is no distension.     Palpations: Abdomen is soft. There is no mass.     Tenderness: There is no abdominal tenderness.  Musculoskeletal:        General: Normal range of motion.     Right lower leg: No edema.     Left lower leg: No edema.     Comments: Charcot foot in right foot Ulcerative callus in sole of foot at region of base of first metatarsal.  No bleeding Left foot with amputation of third toe Left shoulder restricted ROM  Neurological:     Mental Status: He is alert and oriented to person, place, and time.  Psychiatric:        Mood and Affect: Mood normal.      CMP Latest Ref Rng & Units 05/18/2019 04/21/2019 02/13/2019  Glucose 70 - 99 mg/dL 138(H) 225(H) 167(H)  BUN 6 - 20 mg/dL 15 17 18   Creatinine 0.61 - 1.24 mg/dL 0.98 0.94 0.96  Sodium 135 - 145 mmol/L 138 140 138  Potassium 3.5 - 5.1 mmol/L 4.5 4.9 5.0  Chloride 98 - 111 mmol/L 104 101 100  CO2 22 - 32 mmol/L 24 24 21   Calcium 8.9 - 10.3 mg/dL 9.0 9.8 9.7  Total Protein 6.0 - 8.5 g/dL - - 7.6  Total Bilirubin 0.0 - 1.2 mg/dL - - 0.7  Alkaline Phos 39 - 117 IU/L - - 102  AST 0 - 40 IU/L - - 19  ALT 0 - 44 IU/L - - 25    Lipid Panel     Component Value Date/Time   CHOL 157 04/04/2018 1103   TRIG 256 (H) 04/04/2018 1103   HDL 42 04/04/2018 1103   CHOLHDL 3.7 04/04/2018 1103   CHOLHDL 3.8 04/10/2010 0315   VLDL 32 04/10/2010 0315   LDLCALC 64 04/04/2018 1103    CBC    Component Value Date/Time   WBC 9.7 05/18/2019 0954   RBC 5.20 05/18/2019 0954   HGB 15.0 05/18/2019 0954   HGB 14.8 04/21/2019 1234   HCT 45.6 05/18/2019 0954   HCT 44.0 04/21/2019 1234   PLT 225 05/18/2019 0954   PLT 209 04/21/2019 1234   MCV 87.7 05/18/2019 0954   MCV 84 04/21/2019 1234   MCH 28.8 05/18/2019 0954   MCHC 32.9 05/18/2019 0954   RDW 14.3 05/18/2019 0954   RDW 14.6 04/21/2019 1234   LYMPHSABS 2.2 03/28/2019 1352   MONOABS 792 05/14/2016 1641   EOSABS 0.4 03/28/2019 1352   BASOSABS 0.1 03/28/2019 1352   Lab Results  Component Value Date   HGBA1C 7.1 (A) 06/20/2019     Assessment & Plan:  1. Type 2 diabetes mellitus with other circulatory complication, with long-term current  use of insulin ([place]) Controlled with A1c of 7.1 Continue current regimen Counseled on Diabetic diet, my plate method, 997 minutes of moderate intensity exercise/week Blood sugar logs with fasting goals of 80-120 mg/dl, random of less than 180 and in the event of sugars less than 60 mg/dl or greater than 400 mg/dl encouraged to notify the clinic. Advised on the need for annual eye exams, annual foot exams,  Pneumonia vaccine. - POCT glucose (manual entry) - POCT glycosylated hemoglobin (Hb A1C) - insulin glargine (LANTUS SOLOSTAR) 100 UNIT/ML Solostar Pen; Inject 50 Units into the skin 2 (two) times daily.  Dispense: 30 mL; Refill: 6  2. Persistent atrial fibrillation ([facility]) Status post DCCV Currently in sinus rhythm Continue anticoagulation with Eliquis, continue amiodarone, metoprolol Follow-up with EP and cardiology - apixaban (ELIQUIS) 5 MG TABS tablet; Take 1 tablet (5 mg total) by mouth 2 (two) times daily.  Dispense: 60 tablet; Refill: 6 - CMP14+EGFR; Future - Lipid panel; Future  3. Diabetic polyneuropathy associated with type 2 diabetes mellitus (HCC) Stable on gabapentin - liraglutide (VICTOZA) 18 MG/3ML SOPN; Inject 0.3 mLs (1.8 mg total) into the skin daily.  Dispense: 30 mL; Refill: 6  4. Essential hypertension Controlled Continue losartan - losartan (COZAAR) 50 MG tablet; Take 1 tablet (50 mg total) by mouth daily.  Dispense: 90 tablet; Refill: 1 - metoprolol tartrate (LOPRESSOR) 100 MG tablet; Take 1 tablet (100 mg total) by mouth 2 (two) times daily.  Dispense: 180 tablet; Refill: 1  5. Callus of foot No evidence of infection or bleeding Advised to apply gauze with antibiotic dressing Use cushioned shoes He will need diabetic shoes He has an upcoming appointment with podiatry next week  6. Charcot foot due to diabetes mellitus ([place]) See #5 above   Return in about 3 months (around 09/20/2019) for Chronic medical conditions.      [name], MD, FAAFP. [facility] [place], [place]   06/20/2019, 1:43 PM

## 2019-06-26 ENCOUNTER — Ambulatory Visit: Payer: Medicaid Other | Admitting: Family Medicine

## 2019-06-28 ENCOUNTER — Encounter: Payer: Self-pay | Admitting: Podiatry

## 2019-06-28 ENCOUNTER — Ambulatory Visit (INDEPENDENT_AMBULATORY_CARE_PROVIDER_SITE_OTHER): Payer: Medicaid Other

## 2019-06-28 ENCOUNTER — Ambulatory Visit: Payer: Medicaid Other | Admitting: Podiatry

## 2019-06-28 ENCOUNTER — Other Ambulatory Visit: Payer: Self-pay

## 2019-06-28 DIAGNOSIS — E11621 Type 2 diabetes mellitus with foot ulcer: Secondary | ICD-10-CM

## 2019-06-28 DIAGNOSIS — L97412 Non-pressure chronic ulcer of right heel and midfoot with fat layer exposed: Secondary | ICD-10-CM

## 2019-06-28 DIAGNOSIS — E1140 Type 2 diabetes mellitus with diabetic neuropathy, unspecified: Secondary | ICD-10-CM | POA: Diagnosis not present

## 2019-06-28 DIAGNOSIS — L97512 Non-pressure chronic ulcer of other part of right foot with fat layer exposed: Secondary | ICD-10-CM | POA: Diagnosis not present

## 2019-06-28 DIAGNOSIS — E11610 Type 2 diabetes mellitus with diabetic neuropathic arthropathy: Secondary | ICD-10-CM

## 2019-06-28 DIAGNOSIS — E1142 Type 2 diabetes mellitus with diabetic polyneuropathy: Secondary | ICD-10-CM

## 2019-06-28 DIAGNOSIS — Z89422 Acquired absence of other left toe(s): Secondary | ICD-10-CM

## 2019-06-28 MED ORDER — AMOXICILLIN-POT CLAVULANATE 875-125 MG PO TABS: 1.0000 | ORAL_TABLET | Freq: Two times a day (BID) | ORAL | 0 refills | Status: AC

## 2019-06-28 NOTE — Progress Notes (Signed)
Subjective: Jeffery [name] presents today for follow up of at risk foot care. Patient has history of Charcot right foot and s/p amputation left 3rd toe.    He states he has had an open area on the bottom of his right foot for about one month. Has noticed drainage on his sock, although he could not detect any odor. States drainage looked bloody with pus. He has been trying to keep the area clean and dry, but admits to showering barefoot. He denies any fever, chills, night sweats, nausea, vomiting, abnormally low/high blood sugars.        Allergies  Allergen Reactions  . Metformin And Related Other (See Comments)    GI upset    Objective: There were no vitals filed for this visit.  Pt  Is a 59 y.o. WM, morbidly obese in NAD. AAO x 3.  Vascular Examination:  Capillary refill time to digits immediate b/l. Palpable DP pulses b/l. Palpable PT pulses b/l. Pedal hair sparse b/l. Skin temperature gradient within normal limits b/l. Trace edema noted b/l feet.  Dermatological Examination: Pedal skin with normal turgor, texture and tone bilaterally. No interdigital macerations bilaterally. Toenails 1-5 b/l elongated, dystrophic, thickened, crumbly with subungual debris and tenderness to dorsal palpation. Ulceration located submetatarsal head 1 right foot.   Predebridement measurements carried out today of 1.0 cm in diameter..  No periulcerative erythema, no edema, no drainage.  No flocculence, no malodor. Base of ulcer is fibrogranular. Periwound is calloused.  Postdebridement measurements are 2.5 x 3.0 x 0.3 cm cm. Postdebridement, there is no undermining, no tunneling, no visible joint or bone exposure, no probing to bone. There is malodor present. Base of ulcer is noted to be fibrogranular.  Musculoskeletal: Normal muscle strength 5/5 to all lower extremity muscle groups bilaterally, no pain crepitus or joint limitation noted with ROM b/l, Charcot deformity right foot and digital  amputation left 3rd digit.  Neurological: Protective sensation diminished with 10g monofilament b/l. Vibratory sensation absent b/l.  Xray findings both feet: No gas in tissues b/l +Rockerbottom foot noted right foot Changes consistent with Charcot neuroparthropathy of midfoot b/l No bony erosion noted 1st metatarsal right foot at location of ulceration. 1st met head remains intact.  Assessment: 1. 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 Diabetic Charcot foot ([place])   3. Status post amputation of lesser toe, left ([place])   4. Diabetic peripheral neuropathy associated with type 2 diabetes mellitus ([place])    Plan: -Diabetic foot examination performed on today's visit. -Ulcer was debrided of nonviable necrotic tissue and was resected to the level of subcutaneous tissue.  Ulcer was cleansed with wound cleanser. Culture and sensitivity taken from ulcer right foot.    -Iodosorb Gel was applied to base of wound with light dressing. -Xray of right and left foot was performed and reviewed with patient and/or POA. -Surgical shoe was dispensed for right foot. -Prescription sent to pharmacy for Augmentin 875 mg to be taken po bid x 10 days. -Applied PegAssist insert to surgical shoe to offload ulcer. Patient to wear daily.  -Patient was given written instructions on offloading of ulceration and daily dressing changes. Strict orders were given to keep foot dry. Patient was instructed to call immediately if any signs or symptoms of infection arise.  -Patient instructed to report to emergency department with worsening appearance of ulcer/toe/foot, increased pain, foul odor, increased redness, swelling, drainage, fever, chills,  nightsweats, nausea, vomiting, increased blood sugar.  -Patient referred to Dr. Jacqualyn Posey for follow-up of diabetic ulcer submet head 1 right foot. -Patient/POA to call should there be  question/concern in the interim.  Return in about 1 week (around 07/05/2019) for diabetic ulcer right foot, with Dr. Jacqualyn Posey.

## 2019-06-28 NOTE — Patient Instructions (Addendum)
DRESSING CHANGES RIGHT FOOT:  WEAR SURGICAL SHOE AT ALL TIMES    1. KEEP RIGHT  FOOT DRY AT ALL TIMES!!!!  2. CLEANSE ULCER WITH SALINE.  3. DAB DRY WITH GAUZE SPONGE.  4. APPLY A LIGHT AMOUNT OF IODOSORB TO BASE OF ULCER.  5. APPLY OUTER DRESSING/BAND-AID AS INSTRUCTED.  6. WEAR SURGICAL SHOE DAILY AT ALL TIMES.  7. DO NOT WALK BAREFOOT!!!  8.  IF YOU EXPERIENCE ANY FEVER, CHILLS, NIGHTSWEATS, NAUSEA OR VOMITING, ELEVATED OR LOW BLOOD SUGARS, REPORT TO EMERGENCY ROOM.  9. IF YOU EXPERIENCE INCREASED REDNESS, PAIN, SWELLING, DISCOLORATION, ODOR, PUS, DRAINAGE OR WARMTH OF YOUR FOOT, REPORT TO EMERGENCY ROOM.  Diabetes Mellitus and Foot Care Foot care is an important part of your health, especially when you have diabetes. Diabetes may cause you to have problems because of poor blood flow (circulation) to your feet and legs, which can cause your skin to:  Become thinner and drier.  Break more easily.  Heal more slowly.  Peel and crack. You may also have nerve damage (neuropathy) in your legs and feet, causing decreased feeling in them. This means that you may not notice minor injuries to your feet that could lead to more serious problems. Noticing and addressing any potential problems early is the best way to prevent future foot problems. How to care for your feet Foot hygiene  Wash your feet daily with warm water and mild soap. Do not use hot water. Then, pat your feet and the areas between your toes until they are completely dry. Do not soak your feet as this can dry your skin.  Trim your toenails straight across. Do not dig under them or around the cuticle. File the edges of your nails with an emery board or nail file.  Apply a moisturizing lotion or petroleum jelly to the skin on your feet and to dry, brittle toenails. Use lotion that does not contain alcohol and is unscented. Do not apply lotion between your toes. Shoes and socks  Wear clean socks or stockings every day.  Make sure they are not too tight. Do not wear knee-high stockings since they may decrease blood flow to your legs.  Wear shoes that fit properly and have enough cushioning. Always look in your shoes before you put them on to be sure there are no objects inside.  To break in new shoes, wear them for just a few hours a day. This prevents injuries on your feet. Wounds, scrapes, corns, and calluses  Check your feet daily for blisters, cuts, bruises, sores, and redness. If you cannot see the bottom of your feet, use a mirror or ask someone for help.  Do not cut corns or calluses or try to remove them with medicine.  If you find a minor scrape, cut, or break in the skin on your feet, keep it and the skin around it clean and dry. You may clean these areas with mild soap and water. Do not clean the area with peroxide, alcohol, or iodine.  If you have a wound, scrape, corn, or callus on your foot, look at it several times a day to make sure it is healing and not infected. Check for: ? Redness, swelling, or pain. ? Fluid or blood. ? Warmth. ? Pus or a bad smell. General instructions  Do not cross your legs. This may decrease blood flow to your feet.  Do not use heating pads or hot water bottles on your feet. They may burn your skin. If  you have lost feeling in your feet or legs, you may not know this is happening until it is too late.  Protect your feet from hot and cold by wearing shoes, such as at the beach or on hot pavement.  Schedule a complete foot exam at least once a year (annually) or more often if you have foot problems. If you have foot problems, report any cuts, sores, or bruises to your health care provider immediately. Contact a health care provider if:  You have a medical condition that increases your risk of infection and you have any cuts, sores, or bruises on your feet.  You have an injury that is not healing.  You have redness on your legs or feet.  You feel burning or  tingling in your legs or feet.  You have pain or cramps in your legs and feet.  Your legs or feet are numb.  Your feet always feel cold.  You have pain around a toenail. Get help right away if:  You have a wound, scrape, corn, or callus on your foot and: ? You have pain, swelling, or redness that gets worse. ? You have fluid or blood coming from the wound, scrape, corn, or callus. ? Your wound, scrape, corn, or callus feels warm to the touch. ? You have pus or a bad smell coming from the wound, scrape, corn, or callus. ? You have a fever. ? You have a red line going up your leg. Summary  Check your feet every day for cuts, sores, red spots, swelling, and blisters.  Moisturize feet and legs daily.  Wear shoes that fit properly and have enough cushioning.  If you have foot problems, report any cuts, sores, or bruises to your health care provider immediately.  Schedule a complete foot exam at least once a year (annually) or more often if you have foot problems. This information is not intended to replace advice given to you by your health care provider. Make sure you discuss any questions you have with your health care provider. Document Revised: 12/07/2018 Document Reviewed: 04/17/2016 Elsevier Patient Education  [place]? An infection that lies within the keratin of your nail plate that is caused by a fungus.  WHY ME? Fungal infections affect all ages, sexes, races, and creeds.  There may be many factors that predispose you to a fungal infection such as age, coexisting medical conditions such as diabetes, or an autoimmune disease; stress, medications, fatigue, genetics, etc.  Bottom line: fungus thrives in a warm, moist environment and your shoes offer such a location.  IS IT CONTAGIOUS? Theoretically, yes.  You do not want to share shoes, nail clippers or files with someone who has fungal toenails.  Walking around barefoot  in the same room or sleeping in the same bed is unlikely to transfer the organism.  It is important to realize, however, that fungus can spread easily from one nail to the next on the same foot.  HOW DO WE TREAT THIS?  There are several ways to treat this condition.  Treatment may depend on many factors such as age, medications, pregnancy, liver and kidney conditions, etc.  It is best to ask your doctor which options are available to you.  5. No treatment.   Unlike many other medical concerns, you can live with this condition.  However for many people this can be a painful condition and may lead to ingrown toenails or a bacterial infection.  It is recommended that you keep the nails cut short to help reduce the amount of fungal nail. 6. Topical treatment.  These range from herbal remedies to prescription strength nail lacquers.  About 40-50% effective, topicals require twice daily application for approximately 9 to 12 months or until an entirely new nail has grown out.  The most effective topicals are medical grade medications available through physicians offices. 7. Oral antifungal medications.  With an 80-90% cure rate, the most common oral medication requires 3 to 4 months of therapy and stays in your system for a year as the new nail grows out.  Oral antifungal medications do require blood work to make sure it is a safe drug for you.  A liver function panel will be performed prior to starting the medication and after the first month of treatment.  It is important to have the blood work performed to avoid any harmful side effects.  In general, this medication safe but blood work is required. 8. Laser Therapy.  This treatment is performed by applying a specialized laser to the affected nail plate.  This therapy is noninvasive, fast, and non-painful.  It is not covered by insurance and is therefore, out of pocket.  The results have been very good with a 80-95% cure rate.  [facility] is the only  practice in the area to offer this therapy. 9. Permanent Nail Avulsion.  Removing the entire nail so that a new nail will not grow back.

## 2019-06-29 ENCOUNTER — Other Ambulatory Visit: Payer: Self-pay | Admitting: Podiatry

## 2019-06-29 DIAGNOSIS — E11621 Type 2 diabetes mellitus with foot ulcer: Secondary | ICD-10-CM

## 2019-06-29 DIAGNOSIS — Z03818 Encounter for observation for suspected exposure to other biological agents ruled out: Secondary | ICD-10-CM | POA: Diagnosis not present

## 2019-06-29 DIAGNOSIS — L97412 Non-pressure chronic ulcer of right heel and midfoot with fat layer exposed: Secondary | ICD-10-CM

## 2019-06-30 ENCOUNTER — Other Ambulatory Visit: Payer: Self-pay | Admitting: Neurosurgery

## 2019-06-30 DIAGNOSIS — M5414 Radiculopathy, thoracic region: Secondary | ICD-10-CM

## 2019-07-01 LAB — WOUND CULTURE
MICRO NUMBER:: 10312853
SPECIMEN QUALITY:: ADEQUATE

## 2019-07-03 ENCOUNTER — Ambulatory Visit: Payer: Medicaid Other | Admitting: Podiatry

## 2019-07-03 ENCOUNTER — Other Ambulatory Visit: Payer: Self-pay

## 2019-07-03 VITALS — Temp 96.7°F

## 2019-07-03 DIAGNOSIS — E11621 Type 2 diabetes mellitus with foot ulcer: Secondary | ICD-10-CM

## 2019-07-03 DIAGNOSIS — L97512 Non-pressure chronic ulcer of other part of right foot with fat layer exposed: Secondary | ICD-10-CM | POA: Diagnosis not present

## 2019-07-03 DIAGNOSIS — E11610 Type 2 diabetes mellitus with diabetic neuropathic arthropathy: Secondary | ICD-10-CM

## 2019-07-04 DIAGNOSIS — M25571 Pain in right ankle and joints of right foot: Secondary | ICD-10-CM | POA: Diagnosis not present

## 2019-07-04 DIAGNOSIS — M542 Cervicalgia: Secondary | ICD-10-CM | POA: Diagnosis not present

## 2019-07-04 DIAGNOSIS — M545 Low back pain: Secondary | ICD-10-CM | POA: Diagnosis not present

## 2019-07-04 DIAGNOSIS — M5412 Radiculopathy, cervical region: Secondary | ICD-10-CM | POA: Diagnosis not present

## 2019-07-11 NOTE — Progress Notes (Signed)
Subjective: 59 year old male presents the office today for follow-up evaluation of ulceration right foot submetatarsal 1.  He states it feels and looks a lot better.  He denies any drainage or pus or any odor.  He has no fevers, chills, nausea, vomiting.  He has no calf pain, chest pain, shortness of breath.  She will finish taking antibiotics on Saturday. Denies any systemic complaints such as fevers, chills, nausea, vomiting. No acute changes since last appointment, and no other complaints at this time.   Objective: AAO x3, NAD DP/PT pulses palpable bilaterally, CRT less than 3 seconds Protective sensation decreased with Simms Weinstein monofilament On the right foot submetatarsal 1 hyperkeratotic lesion with central ulceration which is granular.  This is superficial or any probing, undermining or tunneling.  The wound measures approximate 0.5 x 0.5 cm.  There is no surrounding erythema, ascending cellulitis there is no fluctuation crepitation.  There is no malodor. No open lesions or pre-ulcerative lesions.  No pain with calf compression, swelling, warmth, erythema        Assessment: Ulceration with healing right foot  Plan: -All treatment options discussed with the patient including all alternatives, risks, complications.  -He seems be doing better.  I sharply debrided the area down to healthy, viable tissue less than 312 with scalpel.  Continue Iodosorb dressing changes daily.  Continue offloading at all times.  Finish course of antibiotics. Monitor for any clinical signs or symptoms of infection and directed to call the office immediately should any occur or go to the ER.  -Patient encouraged to call the office with any questions, concerns, change in symptoms.

## 2019-07-18 ENCOUNTER — Other Ambulatory Visit: Payer: Self-pay

## 2019-07-18 ENCOUNTER — Ambulatory Visit: Payer: Medicaid Other | Admitting: Podiatry

## 2019-07-18 DIAGNOSIS — E11621 Type 2 diabetes mellitus with foot ulcer: Secondary | ICD-10-CM

## 2019-07-18 DIAGNOSIS — L97512 Non-pressure chronic ulcer of other part of right foot with fat layer exposed: Secondary | ICD-10-CM | POA: Diagnosis not present

## 2019-07-18 NOTE — Patient Instructions (Signed)
Apply a small amount of betaine to the wound daily and cover with gauze. Stay off of your foot. If you notice any redness, increase in swelling, pain or any fevers, chills please call me or go to the ER

## 2019-07-25 ENCOUNTER — Ambulatory Visit: Payer: Medicaid Other | Admitting: Podiatry

## 2019-07-26 ENCOUNTER — Ambulatory Visit
Admission: RE | Admit: 2019-07-26 | Discharge: 2019-07-26 | Disposition: A | Discharge status: Home / Self Care | Payer: Medicaid Other | Source: Ambulatory Visit | Attending: Neurosurgery | Admitting: Neurosurgery

## 2019-07-26 ENCOUNTER — Other Ambulatory Visit: Payer: Self-pay

## 2019-07-26 DIAGNOSIS — M5414 Radiculopathy, thoracic region: Secondary | ICD-10-CM

## 2019-07-26 DIAGNOSIS — M546 Pain in thoracic spine: Secondary | ICD-10-CM | POA: Diagnosis not present

## 2019-07-27 DIAGNOSIS — M5414 Radiculopathy, thoracic region: Secondary | ICD-10-CM | POA: Diagnosis not present

## 2019-07-29 DIAGNOSIS — L02619 Cutaneous abscess of unspecified foot: Secondary | ICD-10-CM

## 2019-07-29 DIAGNOSIS — L03119 Cellulitis of unspecified part of limb: Secondary | ICD-10-CM

## 2019-07-29 HISTORY — DX: Cutaneous abscess of unspecified foot: L03.119

## 2019-07-29 HISTORY — DX: Cutaneous abscess of unspecified foot: L02.619

## 2019-08-01 ENCOUNTER — Ambulatory Visit: Payer: Medicaid Other | Admitting: Podiatry

## 2019-08-01 ENCOUNTER — Encounter: Payer: Self-pay | Admitting: Podiatry

## 2019-08-01 ENCOUNTER — Other Ambulatory Visit: Payer: Self-pay

## 2019-08-01 DIAGNOSIS — L97512 Non-pressure chronic ulcer of other part of right foot with fat layer exposed: Secondary | ICD-10-CM

## 2019-08-01 DIAGNOSIS — E11621 Type 2 diabetes mellitus with foot ulcer: Secondary | ICD-10-CM

## 2019-08-01 NOTE — Progress Notes (Signed)
Subjective: 59 year old male presents the office today for follow-up evaluation of wound on the right foot submetatarsal 1.  He states that overall he has been doing well has not had much drainage.  He has been wearing the offloading shoe which does help.  He states he has been trying to offload his foot more.  He has no other concerns today. Denies any systemic complaints such as fevers, chills, nausea, vomiting. No acute changes since last appointment, and no other complaints at this time.   Objective: AAO x3, NAD DP/PT pulses palpable bilaterally, CRT less than 3 seconds Protective sensation decreased with Simms Weinstein monofilament On the right foot submetatarsal 1 hyperkeratotic lesion with central ulceration which is granular.  This is superficial or any probing, undermining or tunneling.  The wound measures approximate 0.8 x 0.6 cm however there is macerated periwound.  There is no probing, undermining or tunneling.  No surrounding erythema, ascending cellulitis.  No fluctuation or crepitation.  There is no malodor No open lesions or pre-ulcerative lesions.  No pain with calf compression, swelling, warmth, erythema          Assessment: Ulceration with healing right foot  Plan: -All treatment options discussed with the patient including all alternatives, risks, complications.  -Sharply debrided the wound today was #312 with scalpel to any complications none healthy, bleeding, granular tissue.  Due to the macerated tissue recommended a small amount of Betadine to the wound daily.  Continue offloading at all times.  Recommend stay off the foot.  Return in about 1 week (around 07/25/2019).  Trula Slade DPM

## 2019-08-04 DIAGNOSIS — M5412 Radiculopathy, cervical region: Secondary | ICD-10-CM | POA: Diagnosis not present

## 2019-08-04 DIAGNOSIS — M542 Cervicalgia: Secondary | ICD-10-CM | POA: Diagnosis not present

## 2019-08-04 DIAGNOSIS — M545 Low back pain: Secondary | ICD-10-CM | POA: Diagnosis not present

## 2019-08-04 DIAGNOSIS — G894 Chronic pain syndrome: Secondary | ICD-10-CM | POA: Diagnosis not present

## 2019-08-04 NOTE — Progress Notes (Signed)
Subjective: 59 year old male presents the office today for follow-up evaluation of wound on the right foot submetatarsal 1.  Discussed that the beach has been on his feet more.  He also states he has noticed a blister at the tip of the toe.  He is wearing a regular shoe today.  Denies any increase in swelling or any redness denies any drainage or pus.   Denies any systemic complaints such as fevers, chills, nausea, vomiting. No acute changes since last appointment, and no other complaints at this time.   Objective: AAO x3, NAD DP/PT pulses palpable bilaterally, CRT less than 3 seconds Protective sensation decreased with Simms Weinstein monofilament On the right foot submetatarsal 1 hyperkeratotic lesion with central ulceration which is granular.  After debridement of the wound today it measures 2 x 2 x 0.2 cm and was larger today.  Prior to debridement the wound measured 1.7 x 1 x 0.2 cm.  After debridement the wound was granular and there is no probing, undermining or tunneling.  There is no surrounding erythema, ascending cellulitis. Tried close of the distal aspect of the hallux.  No edema, erythema to this area. No open lesions or pre-ulcerative lesions.  No pain with calf compression, swelling, warmth, erythema          Assessment: Ulceration right foot with increased size  Plan: -All treatment options discussed with the patient including all alternatives, risks, complications.  -Sharply debrided the wound today was #312 with scalpel to any complications none healthy, bleeding, granular tissue with minimal blood loss.  He can continue daily dressing changes for now.  Darco wedge shoe was dispensed.  I will also order silver alginate dressing.  Recommend stay off the foot.  Return in about 1 week   Trula Slade DPM

## 2019-08-07 ENCOUNTER — Other Ambulatory Visit: Payer: Self-pay

## 2019-08-07 ENCOUNTER — Inpatient Hospital Stay (HOSPITAL_COMMUNITY)
Admission: EM | Admit: 2019-08-07 | Discharge: 2019-08-16 | DRG: 854 | Disposition: A | Discharge status: Home / Self Care | Payer: Medicaid Other | Attending: Family Medicine | Admitting: Family Medicine

## 2019-08-07 ENCOUNTER — Encounter (HOSPITAL_COMMUNITY): Payer: Self-pay | Admitting: Emergency Medicine

## 2019-08-07 ENCOUNTER — Emergency Department (HOSPITAL_COMMUNITY): Payer: Medicaid Other

## 2019-08-07 ENCOUNTER — Telehealth: Payer: Self-pay | Admitting: *Deleted

## 2019-08-07 ENCOUNTER — Ambulatory Visit: Payer: Medicaid Other | Admitting: Podiatry

## 2019-08-07 DIAGNOSIS — Z8249 Family history of ischemic heart disease and other diseases of the circulatory system: Secondary | ICD-10-CM

## 2019-08-07 DIAGNOSIS — L03115 Cellulitis of right lower limb: Secondary | ICD-10-CM | POA: Diagnosis not present

## 2019-08-07 DIAGNOSIS — R6520 Severe sepsis without septic shock: Secondary | ICD-10-CM

## 2019-08-07 DIAGNOSIS — Z7901 Long term (current) use of anticoagulants: Secondary | ICD-10-CM

## 2019-08-07 DIAGNOSIS — L97512 Non-pressure chronic ulcer of other part of right foot with fat layer exposed: Secondary | ICD-10-CM

## 2019-08-07 DIAGNOSIS — E1169 Type 2 diabetes mellitus with other specified complication: Secondary | ICD-10-CM | POA: Diagnosis present

## 2019-08-07 DIAGNOSIS — E1142 Type 2 diabetes mellitus with diabetic polyneuropathy: Secondary | ICD-10-CM | POA: Diagnosis present

## 2019-08-07 DIAGNOSIS — Z794 Long term (current) use of insulin: Secondary | ICD-10-CM

## 2019-08-07 DIAGNOSIS — M1990 Unspecified osteoarthritis, unspecified site: Secondary | ICD-10-CM | POA: Diagnosis present

## 2019-08-07 DIAGNOSIS — G8929 Other chronic pain: Secondary | ICD-10-CM | POA: Diagnosis present

## 2019-08-07 DIAGNOSIS — L0390 Cellulitis, unspecified: Secondary | ICD-10-CM | POA: Diagnosis not present

## 2019-08-07 DIAGNOSIS — E875 Hyperkalemia: Secondary | ICD-10-CM | POA: Diagnosis present

## 2019-08-07 DIAGNOSIS — E1165 Type 2 diabetes mellitus with hyperglycemia: Secondary | ICD-10-CM | POA: Diagnosis present

## 2019-08-07 DIAGNOSIS — M549 Dorsalgia, unspecified: Secondary | ICD-10-CM | POA: Diagnosis present

## 2019-08-07 DIAGNOSIS — A419 Sepsis, unspecified organism: Secondary | ICD-10-CM | POA: Diagnosis not present

## 2019-08-07 DIAGNOSIS — Z96651 Presence of right artificial knee joint: Secondary | ICD-10-CM | POA: Diagnosis present

## 2019-08-07 DIAGNOSIS — M7989 Other specified soft tissue disorders: Secondary | ICD-10-CM | POA: Diagnosis not present

## 2019-08-07 DIAGNOSIS — R509 Fever, unspecified: Secondary | ICD-10-CM | POA: Diagnosis not present

## 2019-08-07 DIAGNOSIS — A4101 Sepsis due to Methicillin susceptible Staphylococcus aureus: Principal | ICD-10-CM | POA: Diagnosis present

## 2019-08-07 DIAGNOSIS — I4821 Permanent atrial fibrillation: Secondary | ICD-10-CM | POA: Diagnosis present

## 2019-08-07 DIAGNOSIS — E785 Hyperlipidemia, unspecified: Secondary | ICD-10-CM | POA: Diagnosis present

## 2019-08-07 DIAGNOSIS — E11621 Type 2 diabetes mellitus with foot ulcer: Secondary | ICD-10-CM | POA: Diagnosis present

## 2019-08-07 DIAGNOSIS — M6740 Ganglion, unspecified site: Secondary | ICD-10-CM | POA: Diagnosis present

## 2019-08-07 DIAGNOSIS — E11649 Type 2 diabetes mellitus with hypoglycemia without coma: Secondary | ICD-10-CM | POA: Diagnosis present

## 2019-08-07 DIAGNOSIS — Z87891 Personal history of nicotine dependence: Secondary | ICD-10-CM

## 2019-08-07 DIAGNOSIS — L02611 Cutaneous abscess of right foot: Secondary | ICD-10-CM | POA: Diagnosis present

## 2019-08-07 DIAGNOSIS — N179 Acute kidney failure, unspecified: Secondary | ICD-10-CM | POA: Diagnosis present

## 2019-08-07 DIAGNOSIS — Z79891 Long term (current) use of opiate analgesic: Secondary | ICD-10-CM

## 2019-08-07 DIAGNOSIS — IMO0002 Reserved for concepts with insufficient information to code with codable children: Secondary | ICD-10-CM

## 2019-08-07 DIAGNOSIS — Z79899 Other long term (current) drug therapy: Secondary | ICD-10-CM

## 2019-08-07 DIAGNOSIS — R739 Hyperglycemia, unspecified: Secondary | ICD-10-CM

## 2019-08-07 DIAGNOSIS — M8580 Other specified disorders of bone density and structure, unspecified site: Secondary | ICD-10-CM | POA: Diagnosis present

## 2019-08-07 DIAGNOSIS — Z833 Family history of diabetes mellitus: Secondary | ICD-10-CM

## 2019-08-07 DIAGNOSIS — L089 Local infection of the skin and subcutaneous tissue, unspecified: Secondary | ICD-10-CM | POA: Diagnosis not present

## 2019-08-07 DIAGNOSIS — M2140 Flat foot [pes planus] (acquired), unspecified foot: Secondary | ICD-10-CM | POA: Diagnosis present

## 2019-08-07 DIAGNOSIS — E782 Mixed hyperlipidemia: Secondary | ICD-10-CM | POA: Diagnosis present

## 2019-08-07 DIAGNOSIS — I480 Paroxysmal atrial fibrillation: Secondary | ICD-10-CM | POA: Diagnosis present

## 2019-08-07 DIAGNOSIS — I10 Essential (primary) hypertension: Secondary | ICD-10-CM | POA: Diagnosis not present

## 2019-08-07 DIAGNOSIS — Z20822 Contact with and (suspected) exposure to covid-19: Secondary | ICD-10-CM | POA: Diagnosis present

## 2019-08-07 DIAGNOSIS — Z6841 Body Mass Index (BMI) 40.0 and over, adult: Secondary | ICD-10-CM

## 2019-08-07 DIAGNOSIS — E11610 Type 2 diabetes mellitus with diabetic neuropathic arthropathy: Secondary | ICD-10-CM | POA: Diagnosis present

## 2019-08-07 DIAGNOSIS — R52 Pain, unspecified: Secondary | ICD-10-CM | POA: Diagnosis not present

## 2019-08-07 DIAGNOSIS — K760 Fatty (change of) liver, not elsewhere classified: Secondary | ICD-10-CM | POA: Diagnosis present

## 2019-08-07 HISTORY — DX: Spinal stenosis, lumbar region without neurogenic claudication: M48.061

## 2019-08-07 LAB — CBC WITH DIFFERENTIAL/PLATELET
Abs Immature Granulocytes: 0.22 10*3/uL — ABNORMAL HIGH (ref 0.00–0.07)
Basophils Absolute: 0.1 10*3/uL (ref 0.0–0.1)
Basophils Relative: 0 %
Eosinophils Absolute: 0 10*3/uL (ref 0.0–0.5)
Eosinophils Relative: 0 %
HCT: 40.1 % (ref 39.0–52.0)
Hemoglobin: 12.8 g/dL — ABNORMAL LOW (ref 13.0–17.0)
Immature Granulocytes: 1 %
Lymphocytes Relative: 4 %
Lymphs Abs: 0.9 10*3/uL (ref 0.7–4.0)
MCH: 29.7 pg (ref 26.0–34.0)
MCHC: 31.9 g/dL (ref 30.0–36.0)
MCV: 93 fL (ref 80.0–100.0)
Monocytes Absolute: 1.7 10*3/uL — ABNORMAL HIGH (ref 0.1–1.0)
Monocytes Relative: 8 %
Neutro Abs: 18.9 10*3/uL — ABNORMAL HIGH (ref 1.7–7.7)
Neutrophils Relative %: 87 %
Platelets: 346 10*3/uL (ref 150–400)
RBC: 4.31 MIL/uL (ref 4.22–5.81)
RDW: 13.8 % (ref 11.5–15.5)
WBC: 21.8 10*3/uL — ABNORMAL HIGH (ref 4.0–10.5)
nRBC: 0 % (ref 0.0–0.2)

## 2019-08-07 LAB — CBG MONITORING, ED: Glucose-Capillary: 308 mg/dL — ABNORMAL HIGH (ref 70–99)

## 2019-08-07 LAB — COMPREHENSIVE METABOLIC PANEL
ALT: 33 U/L (ref 0–44)
AST: 28 U/L (ref 15–41)
Albumin: 2.9 g/dL — ABNORMAL LOW (ref 3.5–5.0)
Alkaline Phosphatase: 82 U/L (ref 38–126)
Anion gap: 14 (ref 5–15)
BUN: 19 mg/dL (ref 6–20)
CO2: 22 mmol/L (ref 22–32)
Calcium: 8.9 mg/dL (ref 8.9–10.3)
Chloride: 96 mmol/L — ABNORMAL LOW (ref 98–111)
Creatinine, Ser: 1.46 mg/dL — ABNORMAL HIGH (ref 0.61–1.24)
GFR calc Af Amer: >60 mL/min (ref >60)
GFR calc non Af Amer: 53 mL/min — ABNORMAL LOW (ref >60)
Glucose, Bld: 302 mg/dL — ABNORMAL HIGH (ref 70–99)
Potassium: 4.2 mmol/L (ref 3.5–5.1)
Sodium: 132 mmol/L — ABNORMAL LOW (ref 135–145)
Total Bilirubin: 1.2 mg/dL (ref 0.3–1.2)
Total Protein: 8 g/dL (ref 6.5–8.1)

## 2019-08-07 LAB — PROTIME-INR
INR: 1.7 — ABNORMAL HIGH (ref 0.8–1.2)
Prothrombin Time: 19 seconds — ABNORMAL HIGH (ref 11.4–15.2)

## 2019-08-07 LAB — LACTIC ACID, PLASMA
Lactic Acid, Venous: 1.9 mmol/L (ref 0.5–1.9)
Lactic Acid, Venous: 1.4 mmol/L (ref 0.5–1.9)

## 2019-08-07 LAB — SARS CORONAVIRUS 2 BY RT PCR (HOSPITAL ORDER, PERFORMED IN ~~LOC~~ HOSPITAL LAB): SARS Coronavirus 2: NEGATIVE

## 2019-08-07 LAB — APTT: aPTT: 37 seconds — ABNORMAL HIGH (ref 24–36)

## 2019-08-07 MED ORDER — SODIUM CHLORIDE 0.9 % IV BOLUS
1000.0000 mL | Freq: Once | INTRAVENOUS | Status: AC
  Administered 2019-08-07: 1000 mL via INTRAVENOUS

## 2019-08-07 MED ORDER — IBUPROFEN 400 MG PO TABS
600.0000 mg | ORAL_TABLET | Freq: Once | ORAL | Status: AC
  Administered 2019-08-07: 600 mg via ORAL
  Filled 2019-08-07: qty 1

## 2019-08-07 MED ORDER — VANCOMYCIN HCL IN DEXTROSE 1-5 GM/200ML-% IV SOLN
1000.0000 mg | Freq: Once | INTRAVENOUS | Status: AC
  Administered 2019-08-07: 1000 mg via INTRAVENOUS
  Filled 2019-08-07: qty 200

## 2019-08-07 MED ORDER — ACETAMINOPHEN 500 MG PO TABS: 1000.0000 mg | ORAL_TABLET | Freq: Once | ORAL | Status: DC

## 2019-08-07 NOTE — ED Notes (Signed)
Pt made aware we need Ua

## 2019-08-07 NOTE — ED Triage Notes (Signed)
Pt arrives to ED from [facility] for chronic wound check on right foot bottom( open wound)- fever 101.3

## 2019-08-07 NOTE — Telephone Encounter (Signed)
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

## 2019-08-07 NOTE — Telephone Encounter (Signed)
Left message for pt to call to discuss current problem with his foot.

## 2019-08-07 NOTE — ED Provider Notes (Signed)
[facility] EMERGENCY DEPARTMENT Provider Note   CSN: [id number] Arrival date & time: 08/07/19  1800     History Chief Complaint  Patient presents with  . Wound Check    Jeffery [name] is a 59 y.o. male.  HPI   This patient is a 59 year old male, known history of diabetes, he has a known history of Charcot joint of both feet however his right foot has been giving him problems.  He has atrial fibrillation.  He has been having progressive pain swelling and now today has had fevers higher than 102 and almost to 103.  On arrival the patient was directed here from his podiatrist Dr. Carman Ching to come to the hospital for antibiotics, imaging of his foot and the podiatrist told the patient that he would see him first thing in the morning.  The patient denies any coughing or shortness of breath, no chest pain, mild headache, he feels very lightheaded and dizzy as well today.  There has been no vomiting, no diarrhea.  He does have a progressive rash on his right foot which is spreading up his leg.  The symptoms are persistent and severe and gradually worsening.  Past Medical History:  Diagnosis Date  . Atrial fibrillation ([place])   . Charcot's joint of right foot   . DDD (degenerative disc disease), lumbar   . Diabetes mellitus without complication ([place])   . Fatty liver   . Hypertension   . Obesity   . Rotator cuff disorder   . Shoulder impingement, right     Patient Active Problem List   Diagnosis Date Noted  . Persistent atrial fibrillation ([place])   . Testosterone deficiency 03/16/2018  . Chronic left shoulder pain 03/01/2018  . Body mass index 40.0-44.9, adult ([place]) 03/01/2018  . DDD (degenerative disc disease), lumbar 12/15/2017  . Chronic anticoagulation 10/26/2017  . Dyspnea 10/26/2017  . S/P right rotator cuff repair 04/05/2017  . Achilles tendon contracture, right 01/05/2017  . Closed nondisplaced fracture of distal phalanx of right great toe 01/05/2017    . Paroxysmal atrial fibrillation ([place]) 10/30/2016  . Pain in right ankle and joints of right foot 07/09/2016  . Diabetic polyneuropathy associated with type 2 diabetes mellitus ([place]) 07/09/2016  . 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 Impingement syndrome of right shoulder 07/07/2016  . Morbid obesity ([place]) 06/05/2016  . S/P TKR (total knee replacement), right 03/28/2015  . Knee osteoarthritis 11/12/2014  . Hypertension 10/15/2014  . Charcot foot due to diabetes mellitus ([place]) 10/01/2014  . Accelerated hypertension 10/01/2014  . Gangrene left third toe 10/01/2014    Past Surgical History:  Procedure Laterality Date  . AMPUTATION Left 10/02/2014   Procedure: Left Third toe amputation ;  Surgeon: [name], MD;  Location: [facility];  Service: Orthopedics;  Laterality: Left;  Regular bed, wants to follow hip  . ANTERIOR CRUCIATE LIGAMENT REPAIR Right 90   reconstruction  . APPLICATION OF WOUND VAC Left 10/02/2014   Procedure: APPLICATION OF WOUND VAC; toe Surgeon: [name], MD;  Location: [facility];  Service: Orthopedics;  Laterality: Left;  . CARDIOVERSION N/A 04/26/2019   Procedure: CARDIOVERSION;  Surgeon: Pixie Casino, MD;  Location: [facility] ENDOSCOPY;  Service: Cardiovascular;  Laterality: N/A;  . CARDIOVERSION N/A 05/18/2019   Procedure: CARDIOVERSION (CATH LAB);  Surgeon: Constance Haw, MD;  Location: [facility] CV LAB;  Service: Cardiovascular;  Laterality: N/A;  . I & D EXTREMITY Left  10/05/2014   Procedure: IRRIGATION AND DEBRIDEMENT LEFT FOOT;  Surgeon: [name], MD;  Location: [facility];  Service: Orthopedics;  Laterality: Left;  . KNEE ARTHROSCOPY W/ ACL RECONSTRUCTION Right   . TOTAL KNEE ARTHROPLASTY Right 03/28/2015  . TOTAL KNEE ARTHROPLASTY Right 03/28/2015   Procedure: RIGHT TOTAL KNEE ARTHROPLASTY;  Surgeon: [name], MD;  Location: [facility];  Service: Orthopedics;  Laterality: Right;       Family History   Problem Relation Age of Onset  . Diabetes Father   . Hypertension Father   . Heart failure Father     Social History   Tobacco Use  . Smoking status: Former Smoker    Packs/day: 0.00    Years: 38.00    Pack years: 0.00    Types: Cigarettes  . Smokeless tobacco: Never Used  Substance Use Topics  . Alcohol use: No    Alcohol/week: 5.0 - 6.0 standard drinks    Types: 5 - 6 Cans of beer per week  . Drug use: Yes    Types: Marijuana    Comment: last week ; denies 03/24/17    Home Medications Prior to Admission medications   Medication Sig Start Date End Date Taking? Authorizing Provider  Accu-Chek FastClix Lancets MISC Use as directed to check blood sugar at least twice daily. E11.8 E11.65 Z79.4 Patient taking differently: 1 each by Other route See admin instructions. Use as directed to check blood sugar at least twice daily. E11.8 E11.65 Z79.4 10/18/18  Yes Newlin, Charlane Ferretti, MD  amiodarone (PACERONE) 200 MG tablet TAKE ONE TABLET BY MOUTH TWICE A DAY Patient taking differently: Take 200 mg by mouth 2 (two) times daily.  06/09/19  Yes Troy Sine, MD  apixaban (ELIQUIS) 5 MG TABS tablet Take 1 tablet (5 mg total) by mouth 2 (two) times daily. 06/20/19  Yes [name], MD  atorvastatin (LIPITOR) 20 MG tablet Take 1 tablet (20 mg total) by mouth daily. 03/28/19  Yes [name], MD  Blood Glucose Monitoring Suppl (ACCU-CHEK AVIVA) device Use as instructed to check blood sugar 2 times daily. E11.8 E11.65 Z79.4 Patient taking differently: 1 each by Other route See admin instructions. Use as instructed to check blood sugar 2 times daily. E11.8 E11.65 Z79.4 10/18/18 10/18/19 Yes [name], MD  furosemide (LASIX) 40 MG tablet Take 1 tablet (40 mg total) by mouth daily. 04/21/19 08/07/19 Yes Troy Sine, MD  gabapentin (NEURONTIN) 300 MG capsule Take 1 capsule (300 mg total) by mouth 2 (two) times daily. Patient taking differently: Take 600 mg by mouth at bedtime as needed  (pain).  03/28/19  Yes Newlin, Charlane Ferretti, MD  glucose blood (ACCU-CHEK AVIVA) test strip Use as instructed to check blood sugar 2 times daily. E11.8 E11.65 Z79.4 Patient taking differently: 1 each by Other route See admin instructions. Use as instructed to check blood sugar 2 times daily. E11.8 E11.65 Z79.4 10/18/18  Yes Newlin, Charlane Ferretti, MD  insulin glargine (LANTUS SOLOSTAR) 100 UNIT/ML Solostar Pen Inject 50 Units into the skin 2 (two) times daily. Patient taking differently: Inject 51 Units into the skin 2 (two) times daily.  06/20/19  Yes Newlin, Charlane Ferretti, MD  insulin lispro (INSULIN LISPRO) 100 UNIT/ML KwikPen Junior Inject 0.08 mLs (8 Units total) into the skin 3 (three) times daily. Patient taking differently: Inject 8 Units into the skin 3 (three) times daily as needed (Low blood sugar).  03/28/19  Yes [name], MD  Insulin Pen Needle (B-D ULTRAFINE III SHORT PEN)  31G X 8 MM MISC 1 each by Does not apply route 3 (three) times daily. 10/12/17  Yes [name], MD  Insulin Syringe-Needle U-100 (TRUEPLUS INSULIN SYRINGE) 30G X 5/16" 0.5 ML MISC Use as directed 3 times daily Patient taking differently: 1 each by Other route See admin instructions. Use as directed 3 times daily 10/06/17  Yes Newlin, Enobong, MD  liraglutide (VICTOZA) 18 MG/3ML SOPN Inject 0.3 mLs (1.8 mg total) into the skin daily. 06/20/19  Yes [name], MD  losartan (COZAAR) 50 MG tablet Take 1 tablet (50 mg total) by mouth daily. 06/20/19  Yes [name], MD  metoprolol tartrate (LOPRESSOR) 100 MG tablet Take 1 tablet (100 mg total) by mouth 2 (two) times daily. 06/20/19  Yes [name], MD  oxyCODONE-acetaminophen (PERCOCET) 10-325 MG tablet Take 1 tablet by mouth 4 (four) times daily.    Yes [provider]  Sennosides (EX-LAX) 15 MG TABS Take 15 mg by mouth daily as needed (Constipation).   Yes [provider]  sodium chloride (OCEAN) 0.65 % SOLN nasal spray Place 1 spray into both nostrils  daily as needed for congestion.    Yes [provider]  TRUEPLUS INSULIN SYRINGE 30G X 5/16" 0.5 ML MISC USE AS DIRECTED 3 TIMES DAILY Patient taking differently: 1 each by Other route in the morning, at noon, and at bedtime.  04/03/16  Yes Tresa Garter, MD  testosterone cypionate (DEPOTESTOSTERONE CYPIONATE) 200 MG/ML injection Inject 1 mL (200 mg total) into the muscle every 28 (twenty-eight) days. Patient not taking: Reported on 08/07/2019 03/16/18   [name], MD    Allergies    Metformin and related  Review of Systems   Review of Systems  All other systems reviewed and are negative.   Physical Exam Updated Vital Signs BP 92/63   Pulse 61   Temp 98.2 F (36.8 C) (Oral)   Resp 17   SpO2 99%   Physical Exam Vitals and nursing note reviewed.  Constitutional:      General: He is in acute distress.     Appearance: He is well-developed. He is ill-appearing and diaphoretic.  HENT:     Head: Normocephalic and atraumatic.     Mouth/Throat:     Pharynx: No oropharyngeal exudate.  Eyes:     General: No scleral icterus.       Right eye: No discharge.        Left eye: No discharge.     Conjunctiva/sclera: Conjunctivae normal.     Pupils: Pupils are equal, round, and reactive to light.  Neck:     Thyroid: No thyromegaly.     Vascular: No JVD.  Cardiovascular:     Rate and Rhythm: Normal rate and regular rhythm.     Heart sounds: Normal heart sounds. No murmur. No friction rub. No gallop.      Comments: Borderline tachycardia, palpable but weak pulses at the radial arteries bilaterally Pulmonary:     Effort: Pulmonary effort is normal. No respiratory distress.     Breath sounds: Normal breath sounds. No wheezing or rales.  Abdominal:     General: Bowel sounds are normal. There is no distension.     Palpations: Abdomen is soft. There is no mass.     Tenderness: There is no abdominal tenderness.  Musculoskeletal:        General: Tenderness and deformity  present. Normal range of motion.     Cervical back: Normal range of motion and neck supple.  Right lower leg: Edema present.     Left lower leg: No edema.     Comments: Left lower extremity missing a toe after surgical amputation, good healed surgical bed.  Right lower extremity below the knee is edematous, erythematous extending from the mid pretibial region through the ankle and the foot.  The foot is warm, hot to the touch, tender to the touch and has a draining open ulcer over the pad of the foot at the base of the first metatarsal.  There is blood and a small amount of purulence coming from this area, there is no subcutaneous emphysema palpated  Lymphadenopathy:     Cervical: No cervical adenopathy.  Skin:    General: Skin is warm.     Findings: No erythema or rash.     Comments: Rash to leg as noted above  Neurological:     Mental Status: He is alert.     Coordination: Coordination normal.     Comments: The patient's gait is disrupted secondary to pain and swelling however he is able to follow commands otherwise  Psychiatric:        Behavior: Behavior normal.     ED Results / Procedures / Treatments   Labs (all labs ordered are listed, but only abnormal results are displayed) Labs Reviewed  COMPREHENSIVE METABOLIC PANEL - Abnormal; Notable for the following components:      Result Value   Sodium 132 (*)    Chloride 96 (*)    Glucose, Bld 302 (*)    Creatinine, Ser 1.46 (*)    Albumin 2.9 (*)    GFR calc non Af Amer 53 (*)    All other components within normal limits  CBC WITH DIFFERENTIAL/PLATELET - Abnormal; Notable for the following components:   WBC 21.8 (*)    Hemoglobin 12.8 (*)    Neutro Abs 18.9 (*)    Monocytes Absolute 1.7 (*)    Abs Immature Granulocytes 0.22 (*)    All other components within normal limits  PROTIME-INR - Abnormal; Notable for the following components:   Prothrombin Time 19.0 (*)    INR 1.7 (*)    All other components within normal limits   APTT - Abnormal; Notable for the following components:   aPTT 37 (*)    All other components within normal limits  CBG MONITORING, ED - Abnormal; Notable for the following components:   Glucose-Capillary 308 (*)    All other components within normal limits  SARS CORONAVIRUS 2 BY RT PCR (HOSPITAL ORDER, [facility])  CULTURE, BLOOD (ROUTINE X 2)  CULTURE, BLOOD (ROUTINE X 2)  CULTURE, BLOOD (ROUTINE X 2)  CULTURE, BLOOD (ROUTINE X 2)  URINE CULTURE  LACTIC ACID, PLASMA  LACTIC ACID, PLASMA  URINALYSIS, ROUTINE W REFLEX MICROSCOPIC    EKG EKG Interpretation  Date/Time:  Monday Aug 07 2019 21:24:48 EDT Ventricular Rate:  65 PR Interval:    QRS Duration: 123 QT Interval:  431 QTC Calculation: 449 R Axis:   18 Text Interpretation: Sinus rhythm Prolonged PR interval Nonspecific intraventricular conduction delay since last tracing no significant change Confirmed by Noemi Chapel [phone]) on 08/07/2019 9:27:28 PM   Radiology DG Foot Complete Right  Result Date: 08/07/2019 CLINICAL DATA:  Foot infection, chronic EXAM: RIGHT FOOT COMPLETE - 3+ VIEW COMPARISON:  June 28, 2019 FINDINGS: Again noted is advanced fragmentation and arthropathy at the midfoot. A ossicle is seen at the superior talonavicular joint. There is inferior migration at the  talonavicular joint with flattening and advanced fragmentation of the navicular. Diffuse soft tissue swelling is seen within the plantar surface of the midfoot and the dorsum of the midfoot. No subcutaneous emphysema is seen. Tibiotalar joint osteoarthritis is noted. There is diffuse osteopenia and pes planus deformity. 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 No definite acute osseous abnormality. Worsening diffuse soft tissue swelling seen on the dorsum and plantar surface of the midfoot Electronically Signed   By: Prudencio Pair M.D.   On: 08/07/2019 22:02     Procedures .Critical Care Performed by: Noemi Chapel, MD Authorized by: Noemi Chapel, MD   Critical care provider statement:    Critical care time (minutes):  35   Critical care time was exclusive of:  Separately billable procedures and treating other patients and teaching time   Critical care was necessary to treat or prevent imminent or life-threatening deterioration of the following conditions:  Sepsis   Critical care was time spent personally by me on the following activities:  Blood draw for specimens, development of treatment plan with patient or surrogate, discussions with consultants, evaluation of patient's response to treatment, examination of patient, obtaining history from patient or surrogate, ordering and performing treatments and interventions, ordering and review of laboratory studies, ordering and review of radiographic studies, pulse oximetry, re-evaluation of patient's condition and review of old charts   (including critical care time)  Medications Ordered in ED Medications  acetaminophen (TYLENOL) tablet 1,000 mg (1,000 mg Oral Refused 08/07/19 1823)  ibuprofen (ADVIL) tablet 600 mg (600 mg Oral Given 08/07/19 1845)  vancomycin (VANCOCIN) IVPB 1000 mg/200 mL premix (0 mg Intravenous Stopped 08/07/19 2312)  sodium chloride 0.9 % bolus 1,000 mL (0 mLs Intravenous Stopped 08/07/19 2312)  sodium chloride 0.9 % bolus 1,000 mL (1,000 mLs Intravenous New Bag/Given 08/07/19 2312)    ED Course  I have reviewed the triage vital signs and the nursing notes.  Pertinent labs & imaging results that were available during my care of the patient were reviewed by me and considered in my medical decision making (see chart for details).    MDM Rules/Calculators/A&P                      This patient is ill-appearing, he presented febrile with a very high leukocytosis of approximately 21,000.  Metabolic panel shows hyperglycemia but is otherwise reassuring.  The patient likely has  sepsis from a diabetic foot ulcer.  He will need antibiotics and evaluation by his foot surgeon in the morning.  At this time the patient will need to be admitted to medical service, will initiate a code sepsis, start antibiotics, he is not hypotensive and has a normal lactic acid thus we will avoid aggressive fluid resuscitation at this time.  He will benefit from 1 L as he does have a slightly low blood pressure.  Will reassess.  The patient continues to have some soft blood pressures.  His lactic acid was 1.4, his creatinine is 1.46, this does reflect a increase in creatinine from baseline and could suggest an acute kidney injury.  Because of fever leukocytosis and endorgan injury severe sepsis is entertained, the patient has been given 2 L of IV fluids and antibiotics, x-ray shows soft tissue swelling but no signs of underlying osteomyelitis, will discuss with hospitalist for admission.  D/w Dr. Cyd Silence who has been kind enough to admit   Final Clinical Impression(s) / ED Diagnoses Final diagnoses:  Severe  sepsis ([place])  Cellulitis of right lower extremity  Hyperglycemia      Noemi Chapel, MD 08/07/19 2332

## 2019-08-07 NOTE — Telephone Encounter (Signed)
Left message for pt to call to discuss his current foot and ankle problem.

## 2019-08-07 NOTE — ED Notes (Signed)
Pt aware of need for urine sample.  

## 2019-08-08 ENCOUNTER — Encounter (HOSPITAL_COMMUNITY): Payer: Self-pay | Admitting: Internal Medicine

## 2019-08-08 ENCOUNTER — Inpatient Hospital Stay (HOSPITAL_COMMUNITY): Payer: Medicaid Other

## 2019-08-08 DIAGNOSIS — E11649 Type 2 diabetes mellitus with hypoglycemia without coma: Secondary | ICD-10-CM | POA: Diagnosis present

## 2019-08-08 DIAGNOSIS — M549 Dorsalgia, unspecified: Secondary | ICD-10-CM | POA: Diagnosis present

## 2019-08-08 DIAGNOSIS — Z794 Long term (current) use of insulin: Secondary | ICD-10-CM | POA: Diagnosis not present

## 2019-08-08 DIAGNOSIS — Z20822 Contact with and (suspected) exposure to covid-19: Secondary | ICD-10-CM | POA: Diagnosis not present

## 2019-08-08 DIAGNOSIS — L03115 Cellulitis of right lower limb: Secondary | ICD-10-CM | POA: Diagnosis present

## 2019-08-08 DIAGNOSIS — A419 Sepsis, unspecified organism: Secondary | ICD-10-CM | POA: Diagnosis not present

## 2019-08-08 DIAGNOSIS — I480 Paroxysmal atrial fibrillation: Secondary | ICD-10-CM | POA: Diagnosis not present

## 2019-08-08 DIAGNOSIS — E1169 Type 2 diabetes mellitus with other specified complication: Secondary | ICD-10-CM

## 2019-08-08 DIAGNOSIS — M1990 Unspecified osteoarthritis, unspecified site: Secondary | ICD-10-CM | POA: Diagnosis present

## 2019-08-08 DIAGNOSIS — E1165 Type 2 diabetes mellitus with hyperglycemia: Secondary | ICD-10-CM | POA: Diagnosis not present

## 2019-08-08 DIAGNOSIS — N179 Acute kidney failure, unspecified: Secondary | ICD-10-CM | POA: Diagnosis present

## 2019-08-08 DIAGNOSIS — L02611 Cutaneous abscess of right foot: Secondary | ICD-10-CM | POA: Diagnosis not present

## 2019-08-08 DIAGNOSIS — M7989 Other specified soft tissue disorders: Secondary | ICD-10-CM | POA: Diagnosis not present

## 2019-08-08 DIAGNOSIS — E875 Hyperkalemia: Secondary | ICD-10-CM | POA: Diagnosis not present

## 2019-08-08 DIAGNOSIS — E782 Mixed hyperlipidemia: Secondary | ICD-10-CM

## 2019-08-08 DIAGNOSIS — Z6841 Body Mass Index (BMI) 40.0 and over, adult: Secondary | ICD-10-CM | POA: Diagnosis not present

## 2019-08-08 DIAGNOSIS — E11610 Type 2 diabetes mellitus with diabetic neuropathic arthropathy: Secondary | ICD-10-CM | POA: Diagnosis not present

## 2019-08-08 DIAGNOSIS — L97512 Non-pressure chronic ulcer of other part of right foot with fat layer exposed: Secondary | ICD-10-CM | POA: Diagnosis present

## 2019-08-08 DIAGNOSIS — R6520 Severe sepsis without septic shock: Secondary | ICD-10-CM

## 2019-08-08 DIAGNOSIS — L089 Local infection of the skin and subcutaneous tissue, unspecified: Secondary | ICD-10-CM | POA: Diagnosis not present

## 2019-08-08 DIAGNOSIS — E785 Hyperlipidemia, unspecified: Secondary | ICD-10-CM | POA: Diagnosis not present

## 2019-08-08 DIAGNOSIS — G8929 Other chronic pain: Secondary | ICD-10-CM | POA: Diagnosis present

## 2019-08-08 DIAGNOSIS — L03031 Cellulitis of right toe: Secondary | ICD-10-CM | POA: Diagnosis not present

## 2019-08-08 DIAGNOSIS — I4821 Permanent atrial fibrillation: Secondary | ICD-10-CM | POA: Diagnosis not present

## 2019-08-08 DIAGNOSIS — A4101 Sepsis due to Methicillin susceptible Staphylococcus aureus: Secondary | ICD-10-CM | POA: Diagnosis present

## 2019-08-08 DIAGNOSIS — E11621 Type 2 diabetes mellitus with foot ulcer: Secondary | ICD-10-CM | POA: Diagnosis not present

## 2019-08-08 DIAGNOSIS — I10 Essential (primary) hypertension: Secondary | ICD-10-CM

## 2019-08-08 DIAGNOSIS — M2140 Flat foot [pes planus] (acquired), unspecified foot: Secondary | ICD-10-CM | POA: Diagnosis present

## 2019-08-08 DIAGNOSIS — R739 Hyperglycemia, unspecified: Secondary | ICD-10-CM | POA: Diagnosis present

## 2019-08-08 DIAGNOSIS — M6740 Ganglion, unspecified site: Secondary | ICD-10-CM | POA: Diagnosis present

## 2019-08-08 DIAGNOSIS — L97519 Non-pressure chronic ulcer of other part of right foot with unspecified severity: Secondary | ICD-10-CM | POA: Diagnosis not present

## 2019-08-08 DIAGNOSIS — E1142 Type 2 diabetes mellitus with diabetic polyneuropathy: Secondary | ICD-10-CM | POA: Diagnosis not present

## 2019-08-08 DIAGNOSIS — E1129 Type 2 diabetes mellitus with other diabetic kidney complication: Secondary | ICD-10-CM | POA: Diagnosis not present

## 2019-08-08 LAB — MAGNESIUM: Magnesium: 2.1 mg/dL (ref 1.7–2.4)

## 2019-08-08 LAB — COMPREHENSIVE METABOLIC PANEL
ALT: 31 U/L (ref 0–44)
AST: 27 U/L (ref 15–41)
Albumin: 2.5 g/dL — ABNORMAL LOW (ref 3.5–5.0)
Alkaline Phosphatase: 79 U/L (ref 38–126)
Anion gap: 13 (ref 5–15)
BUN: 28 mg/dL — ABNORMAL HIGH (ref 6–20)
CO2: 21 mmol/L — ABNORMAL LOW (ref 22–32)
Calcium: 8.4 mg/dL — ABNORMAL LOW (ref 8.9–10.3)
Chloride: 99 mmol/L (ref 98–111)
Creatinine, Ser: 2.31 mg/dL — ABNORMAL HIGH (ref 0.61–1.24)
GFR calc Af Amer: 35 mL/min — ABNORMAL LOW (ref >60)
GFR calc non Af Amer: 30 mL/min — ABNORMAL LOW (ref >60)
Glucose, Bld: 329 mg/dL — ABNORMAL HIGH (ref 70–99)
Potassium: 3.9 mmol/L (ref 3.5–5.1)
Sodium: 133 mmol/L — ABNORMAL LOW (ref 135–145)
Total Bilirubin: 1 mg/dL (ref 0.3–1.2)
Total Protein: 7.1 g/dL (ref 6.5–8.1)

## 2019-08-08 LAB — CBC WITH DIFFERENTIAL/PLATELET
Abs Immature Granulocytes: 0.18 10*3/uL — ABNORMAL HIGH (ref 0.00–0.07)
Basophils Absolute: 0.1 10*3/uL (ref 0.0–0.1)
Basophils Relative: 0 %
Eosinophils Absolute: 0 10*3/uL (ref 0.0–0.5)
Eosinophils Relative: 0 %
HCT: 37.9 % — ABNORMAL LOW (ref 39.0–52.0)
Hemoglobin: 11.8 g/dL — ABNORMAL LOW (ref 13.0–17.0)
Immature Granulocytes: 1 %
Lymphocytes Relative: 6 %
Lymphs Abs: 1.1 10*3/uL (ref 0.7–4.0)
MCH: 29.4 pg (ref 26.0–34.0)
MCHC: 31.1 g/dL (ref 30.0–36.0)
MCV: 94.3 fL (ref 80.0–100.0)
Monocytes Absolute: 1.6 10*3/uL — ABNORMAL HIGH (ref 0.1–1.0)
Monocytes Relative: 9 %
Neutro Abs: 15.5 10*3/uL — ABNORMAL HIGH (ref 1.7–7.7)
Neutrophils Relative %: 84 %
Platelets: 283 10*3/uL (ref 150–400)
RBC: 4.02 MIL/uL — ABNORMAL LOW (ref 4.22–5.81)
RDW: 14.1 % (ref 11.5–15.5)
WBC: 18.4 10*3/uL — ABNORMAL HIGH (ref 4.0–10.5)
nRBC: 0 % (ref 0.0–0.2)

## 2019-08-08 LAB — URINALYSIS, ROUTINE W REFLEX MICROSCOPIC
Bilirubin Urine: NEGATIVE
Glucose, UA: 50 mg/dL — AB
Hgb urine dipstick: NEGATIVE
Ketones, ur: NEGATIVE mg/dL
Leukocytes,Ua: NEGATIVE
Nitrite: NEGATIVE
Protein, ur: 100 mg/dL — AB
Specific Gravity, Urine: 1.029 (ref 1.005–1.030)
pH: 5 (ref 5.0–8.0)

## 2019-08-08 LAB — GLUCOSE, CAPILLARY
Glucose-Capillary: 279 mg/dL — ABNORMAL HIGH (ref 70–99)
Glucose-Capillary: 271 mg/dL — ABNORMAL HIGH (ref 70–99)
Glucose-Capillary: 233 mg/dL — ABNORMAL HIGH (ref 70–99)

## 2019-08-08 LAB — CBG MONITORING, ED
Glucose-Capillary: 278 mg/dL — ABNORMAL HIGH (ref 70–99)
Glucose-Capillary: 249 mg/dL — ABNORMAL HIGH (ref 70–99)

## 2019-08-08 LAB — HEMOGLOBIN A1C
Hgb A1c MFr Bld: 7.1 % — ABNORMAL HIGH (ref 4.8–5.6)
Mean Plasma Glucose: 157.07 mg/dL

## 2019-08-08 MED ORDER — POLYETHYLENE GLYCOL 3350 17 G PO PACK
17.0000 g | PACK | Freq: Every day | ORAL | Status: DC | PRN
  Administered 2019-08-16: 17 g via ORAL
  Filled 2019-08-08: qty 1

## 2019-08-08 MED ORDER — GABAPENTIN 300 MG PO CAPS
600.0000 mg | ORAL_CAPSULE | Freq: Every evening | ORAL | Status: DC | PRN
  Administered 2019-08-12 – 2019-08-13 (×2): 600 mg via ORAL
  Filled 2019-08-08 (×2): qty 2

## 2019-08-08 MED ORDER — VANCOMYCIN HCL 1500 MG/300ML IV SOLN
1500.0000 mg | INTRAVENOUS | Status: DC
  Administered 2019-08-09 – 2019-08-10 (×2): 1500 mg via INTRAVENOUS
  Filled 2019-08-08 (×2): qty 300

## 2019-08-08 MED ORDER — OXYCODONE-ACETAMINOPHEN 5-325 MG PO TABS
1.0000 | ORAL_TABLET | ORAL | Status: DC | PRN
  Filled 2019-08-08 (×2): qty 1

## 2019-08-08 MED ORDER — ACETAMINOPHEN 325 MG PO TABS: 650.0000 mg | ORAL_TABLET | Freq: Four times a day (QID) | ORAL | Status: DC | PRN

## 2019-08-08 MED ORDER — VANCOMYCIN HCL 2000 MG/400ML IV SOLN
2000.0000 mg | INTRAVENOUS | Status: DC
  Administered 2019-08-08: 2000 mg via INTRAVENOUS
  Filled 2019-08-08: qty 400

## 2019-08-08 MED ORDER — INSULIN ASPART 100 UNIT/ML ~~LOC~~ SOLN
8.0000 [IU] | Freq: Three times a day (TID) | SUBCUTANEOUS | Status: DC
  Administered 2019-08-08 – 2019-08-10 (×8): 8 [IU] via SUBCUTANEOUS

## 2019-08-08 MED ORDER — ATORVASTATIN CALCIUM 10 MG PO TABS
20.0000 mg | ORAL_TABLET | Freq: Every day | ORAL | Status: DC
  Administered 2019-08-08 – 2019-08-16 (×8): 20 mg via ORAL
  Filled 2019-08-08 (×9): qty 2

## 2019-08-08 MED ORDER — ONDANSETRON HCL 4 MG PO TABS: 4.0000 mg | ORAL_TABLET | Freq: Four times a day (QID) | ORAL | Status: DC | PRN

## 2019-08-08 MED ORDER — OXYCODONE-ACETAMINOPHEN 5-325 MG PO TABS
2.0000 | ORAL_TABLET | ORAL | Status: DC | PRN
  Administered 2019-08-08 – 2019-08-16 (×43): 2 via ORAL
  Filled 2019-08-08 (×43): qty 2

## 2019-08-08 MED ORDER — INSULIN GLARGINE 100 UNIT/ML ~~LOC~~ SOLN
51.0000 [IU] | Freq: Two times a day (BID) | SUBCUTANEOUS | Status: DC
  Administered 2019-08-08 – 2019-08-14 (×14): 51 [IU] via SUBCUTANEOUS
  Filled 2019-08-08 (×15): qty 0.51

## 2019-08-08 MED ORDER — AMIODARONE HCL 200 MG PO TABS
200.0000 mg | ORAL_TABLET | Freq: Two times a day (BID) | ORAL | Status: DC
  Administered 2019-08-08 – 2019-08-16 (×16): 200 mg via ORAL
  Filled 2019-08-08 (×17): qty 1

## 2019-08-08 MED ORDER — APIXABAN 5 MG PO TABS
5.0000 mg | ORAL_TABLET | Freq: Two times a day (BID) | ORAL | Status: DC
  Administered 2019-08-08 – 2019-08-16 (×17): 5 mg via ORAL
  Filled 2019-08-08 (×19): qty 1

## 2019-08-08 MED ORDER — INSULIN ASPART 100 UNIT/ML ~~LOC~~ SOLN
0.0000 [IU] | Freq: Three times a day (TID) | SUBCUTANEOUS | Status: DC
  Administered 2019-08-08 (×2): 7 [IU] via SUBCUTANEOUS
  Administered 2019-08-08 (×2): 11 [IU] via SUBCUTANEOUS
  Administered 2019-08-09: 4 [IU] via SUBCUTANEOUS
  Administered 2019-08-09: 3 [IU] via SUBCUTANEOUS
  Administered 2019-08-10: 7 [IU] via SUBCUTANEOUS
  Administered 2019-08-10: 11 [IU] via SUBCUTANEOUS
  Administered 2019-08-10: 20 [IU] via SUBCUTANEOUS
  Administered 2019-08-10: 11 [IU] via SUBCUTANEOUS
  Administered 2019-08-11: 7 [IU] via SUBCUTANEOUS
  Administered 2019-08-11 – 2019-08-12 (×3): 4 [IU] via SUBCUTANEOUS

## 2019-08-08 MED ORDER — ACETAMINOPHEN 650 MG RE SUPP: 650.0000 mg | Freq: Four times a day (QID) | RECTAL | Status: DC | PRN

## 2019-08-08 MED ORDER — LACTATED RINGERS IV SOLN: INTRAVENOUS | Status: AC

## 2019-08-08 MED ORDER — ONDANSETRON HCL 4 MG/2ML IJ SOLN
4.0000 mg | Freq: Four times a day (QID) | INTRAMUSCULAR | Status: DC | PRN
  Administered 2019-08-09: 4 mg via INTRAVENOUS

## 2019-08-08 MED ORDER — METOPROLOL TARTRATE 100 MG PO TABS
100.0000 mg | ORAL_TABLET | Freq: Two times a day (BID) | ORAL | Status: DC
  Administered 2019-08-09 – 2019-08-13 (×9): 100 mg via ORAL
  Filled 2019-08-08 (×12): qty 1

## 2019-08-08 NOTE — H&P (Signed)
History and Physical    Jeffery [name] ZHG:[id number] DOB: Aug 23, 1960 DOA: 08/07/2019  PCP: [name], MD  Patient coming from: Home   Chief Complaint:  Right foot pain  HPI:    59 year old male with past medical history of diabetes mellitus type 2, hypertension, hyperlipidemia, obesity, paroxysmal atrial fibrillation status post cardioversion 05/2019 who presents to [facility] emergency department at the direction of his podiatrist due to persisting right foot ulcer and pain.  Patient complains that approximately 3 months ago he began to develop an ulceration of the right foot.  States that this ulceration was initially small and minimally painful.  Over the following several months this ulceration increased in size and the patient began to follow-up with Dr. Jacqualyn Posey with Dr. In the outpatient setting.  Patient states that in the past several weeks the right foot has become particularly painful, 10 out of 10 intensity with weightbearing.  Pain is sharp in quality and nonradiating.  In the past several days, patient explains that he has developed associated redness and swelling of the right foot and leg.  Patient also complains of associated generalized weakness and poor appetite over the span of time.  In the past 24 hours patient has begun to exhibit fevers greater than 102 F.  Patient denies any sick contacts, recent travel or confirmed contact with COVID-19.  Patient states that he eventually spoke to his podiatrist Dr. Jacqualyn Posey on the phone on 5/10 where he was directed to go to the emergency department for intravenous antibiotic therapy and that his podiatrist would see him in the morning in the hospital.  Upon evaluation in the emergency department patient has been found to have a fever in excess of 102 F, substantial leukocytosis of 21, increasing creatinine to 1.46 up from his baseline of 0.98 concerning for early acute kidney injury and clinical concern for right  lower extremity cellulitis.  Intravenous vancomycin was ordered and 2 L of normal saline were administered.  X-ray of the right foot does not reveal any definitive osteomyelitis.  The hospitalist group is now been called to assess the patient for admission the hospital.  Patient states  Review of Systems: A 10-system review of systems has been performed and all systems are negative with the exception of what is listed in the HPI.    Past Medical History:  Diagnosis Date  . Atrial fibrillation ([place])   . Charcot's joint of right foot   . DDD (degenerative disc disease), lumbar   . Diabetes mellitus without complication ([place])   . Fatty liver   . Hypertension   . Obesity   . Rotator cuff disorder   . Shoulder impingement, right     Past Surgical History:  Procedure Laterality Date  . AMPUTATION Left 10/02/2014   Procedure: Left Third toe amputation ;  Surgeon: [name], MD;  Location: [place];  Service: Orthopedics;  Laterality: Left;  Regular bed, wants to follow hip  . ANTERIOR CRUCIATE LIGAMENT REPAIR Right 90   reconstruction  . APPLICATION OF WOUND VAC Left 10/02/2014   Procedure: APPLICATION OF WOUND VAC; toe Surgeon: [name], MD;  Location: [place];  Service: Orthopedics;  Laterality: Left;  . CARDIOVERSION N/A 04/26/2019   Procedure: CARDIOVERSION;  Surgeon: Pixie Casino, MD;  Location: [facility] ENDOSCOPY;  Service: Cardiovascular;  Laterality: N/A;  . CARDIOVERSION N/A 05/18/2019   Procedure: CARDIOVERSION (CATH LAB);  Surgeon: Constance Haw, MD;  Location: [place] CV LAB;  Service: Cardiovascular;  Laterality: N/A;  . I & D EXTREMITY Left 10/05/2014   Procedure: IRRIGATION AND DEBRIDEMENT LEFT FOOT;  Surgeon: [name], MD;  Location: [place];  Service: Orthopedics;  Laterality: Left;  . KNEE ARTHROSCOPY W/ ACL RECONSTRUCTION Right   . TOTAL KNEE ARTHROPLASTY Right 03/28/2015  . TOTAL KNEE ARTHROPLASTY Right 03/28/2015   Procedure: RIGHT TOTAL KNEE ARTHROPLASTY;   Surgeon: [name], MD;  Location: [place];  Service: Orthopedics;  Laterality: Right;     reports that he has quit smoking. His smoking use included cigarettes. He smoked 0.00 packs per day for 38.00 years. He has never used smokeless tobacco. He reports current drug use. Drug: Marijuana. He reports that he does not drink alcohol.  Allergies  Allergen Reactions  . Metformin And Related Other (See Comments)    GI upset    Family History  Problem Relation Age of Onset  . Diabetes Father   . Hypertension Father   . Heart failure Father      Prior to Admission medications   Medication Sig Start Date End Date Taking? Authorizing Provider  Accu-Chek FastClix Lancets MISC Use as directed to check blood sugar at least twice daily. E11.8 E11.65 Z79.4 Patient taking differently: 1 each by Other route See admin instructions. Use as directed to check blood sugar at least twice daily. E11.8 E11.65 Z79.4 10/18/18  Yes Newlin, Charlane Ferretti, MD  amiodarone (PACERONE) 200 MG tablet TAKE ONE TABLET BY MOUTH TWICE A DAY Patient taking differently: Take 200 mg by mouth 2 (two) times daily.  06/09/19  Yes Troy Sine, MD  apixaban (ELIQUIS) 5 MG TABS tablet Take 1 tablet (5 mg total) by mouth 2 (two) times daily. 06/20/19  Yes [name], MD  atorvastatin (LIPITOR) 20 MG tablet Take 1 tablet (20 mg total) by mouth daily. 03/28/19  Yes [name], MD  Blood Glucose Monitoring Suppl (ACCU-CHEK AVIVA) device Use as instructed to check blood sugar 2 times daily. E11.8 E11.65 Z79.4 Patient taking differently: 1 each by Other route See admin instructions. Use as instructed to check blood sugar 2 times daily. E11.8 E11.65 Z79.4 10/18/18 10/18/19 Yes [name], MD  furosemide (LASIX) 40 MG tablet Take 1 tablet (40 mg total) by mouth daily. 04/21/19 08/07/19 Yes Troy Sine, MD  gabapentin (NEURONTIN) 300 MG capsule Take 1 capsule (300 mg total) by mouth 2 (two) times daily. Patient taking  differently: Take 600 mg by mouth at bedtime as needed (pain).  03/28/19  Yes Newlin, Charlane Ferretti, MD  glucose blood (ACCU-CHEK AVIVA) test strip Use as instructed to check blood sugar 2 times daily. E11.8 E11.65 Z79.4 Patient taking differently: 1 each by Other route See admin instructions. Use as instructed to check blood sugar 2 times daily. E11.8 E11.65 Z79.4 10/18/18  Yes Newlin, Charlane Ferretti, MD  insulin glargine (LANTUS SOLOSTAR) 100 UNIT/ML Solostar Pen Inject 50 Units into the skin 2 (two) times daily. Patient taking differently: Inject 51 Units into the skin 2 (two) times daily.  06/20/19  Yes Newlin, Charlane Ferretti, MD  insulin lispro (INSULIN LISPRO) 100 UNIT/ML KwikPen Junior Inject 0.08 mLs (8 Units total) into the skin 3 (three) times daily. Patient taking differently: Inject 8 Units into the skin 3 (three) times daily as needed (Low blood sugar).  03/28/19  Yes Newlin, Charlane Ferretti, MD  Insulin Pen Needle (B-D ULTRAFINE III SHORT PEN) 31G X 8 MM MISC 1 each by Does not apply route 3 (three) times daily. 10/12/17  Yes [name], MD  Insulin Syringe-Needle U-100 (TRUEPLUS INSULIN SYRINGE) 30G X 5/16" 0.5 ML MISC Use as directed 3 times daily Patient taking differently: 1 each by Other route See admin instructions. Use as directed 3 times daily 10/06/17  Yes Newlin, Enobong, MD  liraglutide (VICTOZA) 18 MG/3ML SOPN Inject 0.3 mLs (1.8 mg total) into the skin daily. 06/20/19  Yes [name], MD  losartan (COZAAR) 50 MG tablet Take 1 tablet (50 mg total) by mouth daily. 06/20/19  Yes [name], MD  metoprolol tartrate (LOPRESSOR) 100 MG tablet Take 1 tablet (100 mg total) by mouth 2 (two) times daily. 06/20/19  Yes [name], MD  oxyCODONE-acetaminophen (PERCOCET) 10-325 MG tablet Take 1 tablet by mouth 4 (four) times daily.    Yes [provider]  Sennosides (EX-LAX) 15 MG TABS Take 15 mg by mouth daily as needed (Constipation).   Yes [provider]  sodium chloride (OCEAN)  0.65 % SOLN nasal spray Place 1 spray into both nostrils daily as needed for congestion.    Yes [provider]  TRUEPLUS INSULIN SYRINGE 30G X 5/16" 0.5 ML MISC USE AS DIRECTED 3 TIMES DAILY Patient taking differently: 1 each by Other route in the morning, at noon, and at bedtime.  04/03/16  Yes Tresa Garter, MD  testosterone cypionate (DEPOTESTOSTERONE CYPIONATE) 200 MG/ML injection Inject 1 mL (200 mg total) into the muscle every 28 (twenty-eight) days. Patient not taking: Reported on 08/07/2019 03/16/18   [name], MD    Physical Exam: Vitals:   08/07/19 2200 08/07/19 2230 08/07/19 2300 08/08/19 0010  BP: (!) 92/53 107/66 92/63 117/83  Pulse: 63 62 61 61  Resp: 16 11 17 14   Temp:      TempSrc:      SpO2: 98% 94% 99% 99%    Constitutional: Acute alert and oriented x3, in mild distress due to pain. Skin: Approximate 2 cm in diameter ulceration along the plantar surface of the right foot.  No significant drainage noted.  No foul smell noted.  Associated redness and mild warmth of the distal right lower extremity that tracks halfway up the right leg.  Slightly poor skin turgor otherwise.   Eyes: Pupils are equally reactive to light.  No evidence of scleral icterus or conjunctival pallor.  ENMT: Notably dry mucous membranes.  Posterior pharynx clear of any exudate or lesions.   Neck: normal, supple, no masses, no thyromegaly.  No evidence of jugular venous distension.   Respiratory: clear to auscultation bilaterally, no wheezing, no crackles. Normal respiratory effort. No accessory muscle use.  Cardiovascular: Regular rate and rhythm, no murmurs / rubs / gallops. No extremity edema. 2+ pedal pulses. No carotid bruits.  Chest:   Nontender without crepitus or deformity.   Back:   Nontender without crepitus or deformity. Abdomen: Abdomen is soft and nontender.  No evidence of intra-abdominal masses.  Positive bowel sounds noted in all quadrants.   Musculoskeletal: No  joint deformity upper and lower extremities. Good ROM, no contractures. Normal muscle tone.  Neurologic: CN 2-12 grossly intact. Sensation intact, strength noted to be 5 out of 5 in all 4 extremities.  Patient is following all commands.  Patient is responsive to verbal stimuli.   Psychiatric: Patient presents as a normal mood with appropriate affect.  Patient seems to possess insight as to theircurrent situation.     Labs on Admission: I have personally reviewed following labs and imaging studies -   CBC: Recent Labs  Lab 08/07/19 1900  WBC 21.8*  NEUTROABS 18.9*  HGB 12.8*  HCT 40.1  MCV 93.0  PLT 786   Basic Metabolic Panel: Recent Labs  Lab 08/07/19 1900  NA 132*  K 4.2  CL 96*  CO2 22  GLUCOSE 302*  BUN 19  CREATININE 1.46*  CALCIUM 8.9   GFR: CrCl cannot be calculated (Unknown ideal weight.). Liver Function Tests: Recent Labs  Lab 08/07/19 1900  AST 28  ALT 33  ALKPHOS 82  BILITOT 1.2  PROT 8.0  ALBUMIN 2.9*   No results for input(s): LIPASE, AMYLASE in the last 168 hours. No results for input(s): AMMONIA in the last 168 hours. Coagulation Profile: Recent Labs  Lab 08/07/19 1900  INR 1.7*   Cardiac Enzymes: No results for input(s): CKTOTAL, CKMB, CKMBINDEX, TROPONINI in the last 168 hours. BNP (last 3 results) No results for input(s): PROBNP in the last 8760 hours. HbA1C: No results for input(s): HGBA1C in the last 72 hours. CBG: Recent Labs  Lab 08/07/19 2034  GLUCAP 308*   Lipid Profile: No results for input(s): CHOL, HDL, LDLCALC, TRIG, CHOLHDL, LDLDIRECT in the last 72 hours. Thyroid Function Tests: No results for input(s): TSH, T4TOTAL, FREET4, T3FREE, THYROIDAB in the last 72 hours. Anemia Panel: No results for input(s): VITAMINB12, FOLATE, FERRITIN, TIBC, IRON, RETICCTPCT in the last 72 hours. Urine analysis:    Component Value Date/Time   COLORURINE YELLOW 03/18/2015 [place] 03/18/2015 1053   LABSPEC 1.022  03/18/2015 1053   PHURINE 6.0 03/18/2015 1053   GLUCOSEU >1000 (A) 03/18/2015 1053   HGBUR NEGATIVE 03/18/2015 1053   BILIRUBINUR neg 12/31/2016 1401   KETONESUR NEGATIVE 03/18/2015 1053   PROTEINUR trace 12/31/2016 1401   PROTEINUR NEGATIVE 03/18/2015 1053   UROBILINOGEN 4.0 (A) 12/31/2016 1401   UROBILINOGEN 0.2 10/01/2014 0951   NITRITE neg 12/31/2016 1401   NITRITE NEGATIVE 03/18/2015 1053   LEUKOCYTESUR Negative 12/31/2016 1401    Radiological Exams on Admission - Personally Reviewed: DG Foot Complete Right  Result Date: 08/07/2019 CLINICAL DATA:  Foot infection, chronic EXAM: RIGHT FOOT COMPLETE - 3+ VIEW COMPARISON:  June 28, 2019 FINDINGS: Again noted is advanced fragmentation and arthropathy at the midfoot. A ossicle is seen at the superior talonavicular joint. There is inferior migration at the talonavicular joint with flattening and advanced fragmentation of the navicular. Diffuse soft tissue swelling is seen within the plantar surface of the midfoot and the dorsum of the midfoot. No subcutaneous emphysema is seen. Tibiotalar joint osteoarthritis is noted. There is diffuse osteopenia and pes planus deformity. 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 No definite acute osseous abnormality. Worsening diffuse soft tissue swelling seen on the dorsum and plantar surface of the midfoot Electronically Signed   By: Prudencio Pair M.D.   On: 08/07/2019 22:02    EKG: Personally reviewed.  Rhythm is normal sinus rhythm with heart rate of 65 bpm.  No dynamic ST segment changes appreciated.  Assessment/Plan Active Problems: Severe sepsis with acute organ dysfunction due to methicillin susceptible Staphylococcus aureus   Patient presenting with fevers in excess of 102 F, substantial leukocytosis in excess of 21,000 in the setting of right lower extremity cellulitis and mild acute kidney injury all concerning for sepsis  Hydrating  patient with intravenous isotonic fluid  Has been placed on intravenous vancomycin based on recent wound culture in March -focusing primarily on gram-positive organisms considering MSSA growing from wound in March  Blood cultures have been obtained and are pending  Performing daily dressing changes, wound care consultation placed   Dr. Jacqualyn Posey mentioned to the patient that he would see the patient in the morning, we will formally consult in the a.m.  X-ray of the foot reveals no obvious evidence of osteomyelitis  Cellulitis of right lower extremity   Please see assessment and plan above   Right foot ulcer, with fat layer exposed ([place])   Please see assessment and plan above  Acute kidney injury   Mild acute kidney injury with rise in creatinine to 1.46 compared to previous creatinine of 0.98 several months ago.  Hydrating patient with intravenous isotonic fluids  Strict input and output monitoring  Monitoring renal function and electrolytes with serial chemistries  Temporarily holding ARB    Essential hypertension   Considering low normal blood pressures and mild acute kidney injury upon arrival to the emergency department, fairly holding ARB  Home regimen of beta-blocker therapy has been continued with parameters  Monitoring for episodic hypotension considering sepsis    Uncontrolled type 2 diabetes mellitus with diabetic polyneuropathy, without long-term current use of insulin ([facility])   Accu-Cheks before every meal and nightly with sliding scale insulin  Continue home regimen of basal therapy  Continue home regimen of prandial therapy  Hemoglobin A1c ordered    Paroxysmal atrial fibrillation ([place])   Continue home regimen of Eliquis  Monitoring patient on telemetry  Currently in sinus rhythm, patient is status post cardioversion in February    Mixed diabetic hyperlipidemia associated with type 2 diabetes mellitus ([place])   Continue home regimen of  statin therapy      Code Status:  Full code Family Communication: Deferred  Status is: Inpatient  The patient will require care spanning > 2 midnights and should be moved to inpatient because: Ongoing active pain requiring inpatient pain management, IV treatments appropriate due to intensity of illness or inability to take PO and Inpatient level of care appropriate due to severity of illness  Dispo: The patient is from: Home              Anticipated d/c is to: Home              Anticipated d/c date is: 3 days              Patient currently is not medically stable to d/c.        Vernelle Emerald MD [facility] Pager [phone]  If 7PM-7AM, please contact night-coverage [email or web address] Use universal [place] password for that web site. If you do not have the password, please call the hospital operator.  08/08/2019, 1:13 AM

## 2019-08-08 NOTE — ED Notes (Signed)
Pt has attempted a couple of times to produce a urine specimen but has been unsuccessful.

## 2019-08-08 NOTE — ED Notes (Signed)
Pt transported to MRI 

## 2019-08-08 NOTE — Consult Note (Signed)
Reason for Consult: Infection Referring Physician: Dr Inda Merlin, MD  Jeffery [name] is an 59 y.o. male.  HPI: 59 year old male was sent to the hospital yesterday via EMS given worsening ulceration, cellulitis of the right foot.  He was found diaphoretic, lethargic at the time.  Upon admission to the hospital his white blood cell count was 21.8 hemoglobin 12, lactic acid 1.9, creatine 1.9. X-rays did not reveal any acute bony abnormalities.   We started seeing the patient for the ulceration on March 31.  I have not seen him for the last month.  At last appointment the wound did worsen after that but the beach.  At the time there is no signs of infection but he reports that on Thursday he started increasing pain and Friday the pain worsened.  Over the weekend he started not to feel well and he called the office on Monday afternoon.  Upon evaluation the patient emergency department he feels much better this morning.  He denies any fevers or chills.  No chest pain or shortness of breath.  Past Medical History:  Diagnosis Date  . Atrial fibrillation ([place])   . Charcot's joint of right foot   . DDD (degenerative disc disease), lumbar   . Diabetes mellitus without complication ([place])   . Fatty liver   . Hypertension   . Obesity   . Rotator cuff disorder   . Shoulder impingement, right     Past Surgical History:  Procedure Laterality Date  . AMPUTATION Left 10/02/2014   Procedure: Left Third toe amputation ;  Surgeon: [name], MD;  Location: [facility];  Service: Orthopedics;  Laterality: Left;  Regular bed, wants to follow hip  . ANTERIOR CRUCIATE LIGAMENT REPAIR Right 90   reconstruction  . APPLICATION OF WOUND VAC Left 10/02/2014   Procedure: APPLICATION OF WOUND VAC; toe Surgeon: [name], MD;  Location: [facility];  Service: Orthopedics;  Laterality: Left;  . CARDIOVERSION N/A 04/26/2019   Procedure: CARDIOVERSION;  Surgeon: Pixie Casino, MD;  Location: [facility] ENDOSCOPY;  Service:  Cardiovascular;  Laterality: N/A;  . CARDIOVERSION N/A 05/18/2019   Procedure: CARDIOVERSION (CATH LAB);  Surgeon: Constance Haw, MD;  Location: [facility] CV LAB;  Service: Cardiovascular;  Laterality: N/A;  . I & D EXTREMITY Left 10/05/2014   Procedure: IRRIGATION AND DEBRIDEMENT LEFT FOOT;  Surgeon: [name], MD;  Location: [facility];  Service: Orthopedics;  Laterality: Left;  . KNEE ARTHROSCOPY W/ ACL RECONSTRUCTION Right   . TOTAL KNEE ARTHROPLASTY Right 03/28/2015  . TOTAL KNEE ARTHROPLASTY Right 03/28/2015   Procedure: RIGHT TOTAL KNEE ARTHROPLASTY;  Surgeon: [name], MD;  Location: [facility];  Service: Orthopedics;  Laterality: Right;    Family History  Problem Relation Age of Onset  . Diabetes Father   . Hypertension Father   . Heart failure Father     Social History:  reports that he has quit smoking. His smoking use included cigarettes. He smoked 0.00 packs per day for 38.00 years. He has never used smokeless tobacco. He reports current drug use. Drug: Marijuana. He reports that he does not drink alcohol.  Allergies:  Allergies  Allergen Reactions  . Metformin And Related Other (See Comments)    GI upset    Medications: I have reviewed the patient's current medications.  Results for orders placed or performed during the hospital encounter of 08/07/19 (from the past 48 hour(s))  Comprehensive metabolic panel     Status: Abnormal  Collection Time: 08/07/19  7:00 PM  Result Value Ref Range   Sodium 132 (L) 135 - 145 mmol/L   Potassium 4.2 3.5 - 5.1 mmol/L   Chloride 96 (L) 98 - 111 mmol/L   CO2 22 22 - 32 mmol/L   Glucose, Bld 302 (H) 70 - 99 mg/dL    Comment: Glucose reference range applies only to samples taken after fasting for at least 8 hours.   BUN 19 6 - 20 mg/dL   Creatinine, Ser 1.46 (H) 0.61 - 1.24 mg/dL   Calcium 8.9 8.9 - 10.3 mg/dL   Total Protein 8.0 6.5 - 8.1 g/dL   Albumin 2.9 (L) 3.5 - 5.0 g/dL   AST 28 15 - 41 U/L   ALT 33 0 - 44 U/L    Alkaline Phosphatase 82 38 - 126 U/L   Total Bilirubin 1.2 0.3 - 1.2 mg/dL   GFR calc non Af Amer 53 (L) >60 mL/min   GFR calc Af Amer >60 >60 mL/min   Anion gap 14 5 - 15    Comment: Performed at [facility] [address] [place], [place] [address]  Lactic acid, plasma     Status: None   Collection Time: 08/07/19  7:00 PM  Result Value Ref Range   Lactic Acid, Venous 1.9 0.5 - 1.9 mmol/L    Comment: Performed at [facility] [address], [place], [place] [address]  CBC with Differential     Status: Abnormal   Collection Time: 08/07/19  7:00 PM  Result Value Ref Range   WBC 21.8 (H) 4.0 - 10.5 K/uL   RBC 4.31 4.22 - 5.81 MIL/uL   Hemoglobin 12.8 (L) 13.0 - 17.0 g/dL   HCT 40.1 39.0 - 52.0 %   MCV 93.0 80.0 - 100.0 fL   MCH 29.7 26.0 - 34.0 pg   MCHC 31.9 30.0 - 36.0 g/dL   RDW 13.8 11.5 - 15.5 %   Platelets 346 150 - 400 K/uL   nRBC 0.0 0.0 - 0.2 %   Neutrophils Relative % 87 %   Neutro Abs 18.9 (H) 1.7 - 7.7 K/uL   Lymphocytes Relative 4 %   Lymphs Abs 0.9 0.7 - 4.0 K/uL   Monocytes Relative 8 %   Monocytes Absolute 1.7 (H) 0.1 - 1.0 K/uL   Eosinophils Relative 0 %   Eosinophils Absolute 0.0 0.0 - 0.5 K/uL   Basophils Relative 0 %   Basophils Absolute 0.1 0.0 - 0.1 K/uL   Immature Granulocytes 1 %   Abs Immature Granulocytes 0.22 (H) 0.00 - 0.07 K/uL    Comment: Performed at [facility] [address] [place], [place] [address]  Protime-INR     Status: Abnormal   Collection Time: 08/07/19  7:00 PM  Result Value Ref Range   Prothrombin Time 19.0 (H) 11.4 - 15.2 seconds   INR 1.7 (H) 0.8 - 1.2    Comment: (NOTE) INR goal varies based on device and disease states. Performed at [facility], [place] [address] [place],  [address]   Culture, blood (Routine x 2)     Status: None (Preliminary result)   Collection Time: 08/07/19  7:00 PM   Specimen: BLOOD  Result Value Ref Range   Specimen Description BLOOD LEFT ARM    Special  Requests      BOTTLES DRAWN AEROBIC AND ANAEROBIC Blood Culture results may not be optimal due to an inadequate volume of blood  received in culture bottles   Culture      NO GROWTH < 12 HOURS Performed at [facility], [place] [address] [place], [place] [address]    Report Status PENDING   Culture, blood (Routine x 2)     Status: None (Preliminary result)   Collection Time: 08/07/19  7:02 PM   Specimen: BLOOD  Result Value Ref Range   Specimen Description BLOOD RIGHT HAND    Special Requests      BOTTLES DRAWN AEROBIC AND ANAEROBIC Blood Culture results may not be optimal due to an inadequate volume of blood received in culture bottles   Culture      NO GROWTH < 12 HOURS Performed at [facility], [place] [address] [place], [place] [address]    Report Status PENDING   CBG monitoring, ED     Status: Abnormal   Collection Time: 08/07/19  8:34 PM  Result Value Ref Range   Glucose-Capillary 308 (H) 70 - 99 mg/dL    Comment: Glucose reference range applies only to samples taken after fasting for at least 8 hours.  SARS Coronavirus 2 by RT PCR (hospital order, performed in [facility] hospital lab) Nasopharyngeal Nasopharyngeal Swab     Status: None   Collection Time: 08/07/19  9:25 PM   Specimen: Nasopharyngeal Swab  Result Value Ref Range   SARS Coronavirus 2 NEGATIVE NEGATIVE    Comment: (NOTE) SARS-CoV-2 target nucleic acids are NOT DETECTED. The SARS-CoV-2 RNA is generally detectable in upper and lower respiratory specimens during the acute phase of infection. The lowest concentration of SARS-CoV-2 viral copies this assay can detect is 250 copies / mL. A negative result does not preclude SARS-CoV-2 infection and should not be used as the sole basis for treatment or other patient management decisions.  A negative result may occur with improper specimen collection / handling, submission of specimen other than nasopharyngeal swab, presence of viral mutation(s) within  the areas targeted by this assay, and inadequate number of viral copies (<250 copies / mL). A negative result must be combined with clinical observations, patient history, and epidemiological information. Fact Sheet for Patients:   StrictlyIdeas.no Fact Sheet for Healthcare Providers: BankingDealers.co.za This test is not yet approved or cleared  by the [place] FDA and has been authorized for detection and/or diagnosis of SARS-CoV-2 by FDA under an Emergency Use Authorization (EUA).  This EUA will remain in effect (meaning this test can be used) for the duration of the COVID-19 declaration under Section 564(b)(1) of the Act, 21 U.S.C. section 360bbb-3(b)(1), unless the authorization is terminated or revoked sooner. Performed at [facility], [place] [address], [place], [place] [address]   APTT     Status: Abnormal   Collection Time: 08/07/19  9:43 PM  Result Value Ref Range   aPTT 37 (H) 24 - 36 seconds    Comment:        IF BASELINE aPTT IS ELEVATED, SUGGEST PATIENT RISK ASSESSMENT BE USED TO DETERMINE APPROPRIATE ANTICOAGULANT THERAPY. Performed at [facility], [place] [address] [place], [place] [address]   Lactic acid, plasma     Status: None   Collection Time: 08/07/19  9:45 PM  Result Value Ref Range   Lactic Acid, Venous 1.4 0.5 - 1.9 mmol/L    Comment: Performed at [facility] [address] [place], [place] [address]  CBC WITH DIFFERENTIAL     Status: Abnormal   Collection Time: 08/08/19  3:54 AM  Result Value Ref Range   WBC 18.4 (H) 4.0 - 10.5 K/uL   RBC 4.02 (L) 4.22 - 5.81 MIL/uL   Hemoglobin 11.8 (L) 13.0 - 17.0 g/dL   HCT 37.9 (L) 39.0 - 52.0 %   MCV 94.3 80.0 - 100.0 fL   MCH 29.4 26.0 - 34.0 pg   MCHC 31.1 30.0 - 36.0 g/dL   RDW 14.1 11.5 - 15.5 %   Platelets 283 150 - 400 K/uL   nRBC 0.0 0.0 - 0.2 %   Neutrophils Relative % 84 %   Neutro Abs 15.5 (H) 1.7 - 7.7 K/uL   Lymphocytes  Relative 6 %   Lymphs Abs 1.1 0.7 - 4.0 K/uL   Monocytes Relative 9 %   Monocytes Absolute 1.6 (H) 0.1 - 1.0 K/uL   Eosinophils Relative 0 %   Eosinophils Absolute 0.0 0.0 - 0.5 K/uL   Basophils Relative 0 %   Basophils Absolute 0.1 0.0 - 0.1 K/uL   Immature Granulocytes 1 %   Abs Immature Granulocytes 0.18 (H) 0.00 - 0.07 K/uL    Comment: Performed at [facility], [address] [place], [place] [address]  Comprehensive metabolic panel     Status: Abnormal   Collection Time: 08/08/19  3:54 AM  Result Value Ref Range   Sodium 133 (L) 135 - 145 mmol/L   Potassium 3.9 3.5 - 5.1 mmol/L   Chloride 99 98 - 111 mmol/L   CO2 21 (L) 22 - 32 mmol/L   Glucose, Bld 329 (H) 70 - 99 mg/dL    Comment: Glucose reference range applies only to samples taken after fasting for at least 8 hours.   BUN 28 (H) 6 - 20 mg/dL   Creatinine, Ser 2.31 (H) 0.61 - 1.24 mg/dL   Calcium 8.4 (L) 8.9 - 10.3 mg/dL   Total Protein 7.1 6.5 - 8.1 g/dL   Albumin 2.5 (L) 3.5 - 5.0 g/dL   AST 27 15 - 41 U/L   ALT 31 0 - 44 U/L   Alkaline Phosphatase 79 38 - 126 U/L   Total Bilirubin 1.0 0.3 - 1.2 mg/dL   GFR calc non Af Amer 30 (L) >60 mL/min   GFR calc Af Amer 35 (L) >60 mL/min   Anion gap 13 5 - 15    Comment: Performed at [facility] [address], [place], [place] [address]  Magnesium     Status: None   Collection Time: 08/08/19  3:54 AM  Result Value Ref Range   Magnesium 2.1 1.7 - 2.4 mg/dL    Comment: Performed at [facility] [address], [place], [place] [address]  Urinalysis, Routine w reflex microscopic     Status: Abnormal   Collection Time: 08/08/19  5:36 AM  Result Value Ref Range   Color, Urine AMBER (A) YELLOW    Comment: BIOCHEMICALS MAY BE AFFECTED BY COLOR   APPearance CLOUDY (A) CLEAR   Specific Gravity, Urine 1.029 1.005 - 1.030   pH 5.0 5.0 - 8.0   Glucose, UA 50 (A) NEGATIVE mg/dL   Hgb urine dipstick NEGATIVE NEGATIVE   Bilirubin Urine NEGATIVE NEGATIVE    Ketones, ur NEGATIVE NEGATIVE mg/dL   Protein, ur 100 (A) NEGATIVE mg/dL   Nitrite NEGATIVE NEGATIVE   Leukocytes,Ua NEGATIVE NEGATIVE   RBC / HPF 0-5 0 - 5 RBC/hpf   WBC, UA 11-20 0 - 5 WBC/hpf   Bacteria, UA FEW (A) NONE SEEN  Squamous Epithelial / LPF 0-5 0 - 5   Mucus PRESENT    Hyaline Casts, UA PRESENT     Comment: Performed at [facility], [place] [address], [place],  [address]    DG Foot Complete Right  Result Date: 08/07/2019 CLINICAL DATA:  Foot infection, chronic EXAM: RIGHT FOOT COMPLETE - 3+ VIEW COMPARISON:  June 28, 2019 FINDINGS: Again noted is advanced fragmentation and arthropathy at the midfoot. A ossicle is seen at the superior talonavicular joint. There is inferior migration at the talonavicular joint with flattening and advanced fragmentation of the navicular. Diffuse soft tissue swelling is seen within the plantar surface of the midfoot and the dorsum of the midfoot. No subcutaneous emphysema is seen. Tibiotalar joint osteoarthritis is noted. There is diffuse osteopenia and pes planus deformity. 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 No definite acute osseous abnormality. Worsening diffuse soft tissue swelling seen on the dorsum and plantar surface of the midfoot Electronically Signed   By: Prudencio Pair M.D.   On: 08/07/2019 22:02    Review of Systems Blood pressure (!) 92/58, pulse (!) 59, temperature 97.7 F (36.5 C), temperature source Oral, resp. rate 16, SpO2 98 %. Physical Exam General: AAO x3, NAD  Dermatological: Ulceration right foot metatarsal along with bloody drainage expressed.  There is still significant edema and erythema to the foot and leg and warmth present.  There is no fluctuation palpitation.  There is no purulence identified.  No obvious signs of an abscess.  Vascular: Dorsalis Pedis artery and Posterior Tibial artery pedal pulses are 1/4 bilateral with immedate capillary  fill time.  Swelling to the leg there is no pain with calf compression.   Neruologic: Decreased foot light touch.  Musculoskeletal: There is no pain of the foot and no pain to the ulceration.  Assessment/Plan: Right foot ulceration with cellulitis, sepsis  Patient alert department.  X-rays did not reveal any significant acute bony abnormalities.  Creatinine this morning is 2.31, Lactic acid 1.94 white blood cell count 18.4.  At this point given the sepsis as well as cellulitis I recommended MRI to rule out any osteomyelitis or abscess.  Order was placed today for MRI of the right foot without contrast given his kidney function.  Continue elevation and continue IV Lasix.  Will monitor.  Trula Slade 08/08/2019, 7:32 AM  O: [phone] C: [phone]

## 2019-08-08 NOTE — Progress Notes (Signed)
p Pharmacy Antibiotic Note  ULES MARSALA is a 59 y.o. male admitted on 08/07/2019 with RLe cellulitis, possible sepsis.  Pharmacy has been consulted for Vancomycin dosing.  Vancomycin 1 g IV given in ED at  10 pm  Plan: Vancomycin 2000 mg IV q24h     Temp (24hrs), Avg:99.3 F (37.4 C), Min:97.7 F (36.5 C), Max:102.8 F (39.3 C)  Recent Labs  Lab 08/07/19 1900 08/07/19 2145  WBC 21.8*  --   CREATININE 1.46*  --   LATICACIDVEN 1.9 1.4    CrCl cannot be calculated (Unknown ideal weight.).    Allergies  Allergen Reactions  . Metformin And Related Other (See Comments)    GI upset    Caryl Pina 08/08/2019 5:24 AM

## 2019-08-08 NOTE — ED Notes (Signed)
Lunch Tray Ordered 

## 2019-08-08 NOTE — Progress Notes (Signed)
Patient was admitted earlier this morning by Dr. Cyd Silence please see his detailed H&P.  59 year old gentleman prior history of diabetes, hypertension, hyperlipidemia, obesity, paroxysmal atrial fibrillation s/p cardioversion in February 2021 presents to ED for persistent right foot ulcer and pain.  He was admitted for sepsis probably secondary to right foot ulcer. He was started on broad-spectrum IV antibiotics and MRI of the right foot did not really have any osteomyelitis. Podiatry consulted and recommendations given.    Patient seen and examined at bedside No new complaints at this time.   Plan  Continue broad-spectrum IV antibiotics, elevate the leg and monitor.  Hosie Poisson, MD

## 2019-08-08 NOTE — Progress Notes (Signed)
Pharmacy Antibiotic Note  Jeffery [name] is a 59 y.o. male admitted on 08/07/2019 with cellulitis.  Pharmacy has been consulted for vancomycin dosing. Pt is febrile with TMax 102.8 and WBC is elevated at 18.4. Scr is elevated well above baseline at 2.31.   Plan: Change vancomycin to 1500mg  IV Q24H F/u renal fxn, C&S, clinical status and peak/trough at SS  Height: 6' (182.9 cm) Weight: (!) 160.1 kg (353 lb) IBW/kg (Calculated) : 77.6  Temp (24hrs), Avg:99.3 F (37.4 C), Min:97.7 F (36.5 C), Max:102.8 F (39.3 C)  Recent Labs  Lab 08/07/19 1900 08/07/19 2145 08/08/19 0354  WBC 21.8*  --  18.4*  CREATININE 1.46*  --  2.31*  LATICACIDVEN 1.9 1.4  --     Estimated Creatinine Clearance: 55.2 mL/min (A) (by C-G formula based on SCr of 2.31 mg/dL (H)).    Allergies  Allergen Reactions  . Metformin And Related Other (See Comments)    GI upset    Antimicrobials this admission: Vanc 5/10>>  Dose adjustments this admission: N/A  Microbiology results: Pending  Thank you for allowing pharmacy to be a part of this patient's care.  Natania Finigan, Rande Lawman 08/08/2019 9:15 AM

## 2019-08-08 NOTE — Progress Notes (Signed)
Subjective: 59 year old male presents today for an acute appointment given the worsening pain to the right foot.  Upon evaluation of him initially he appeared to be lethargic, diaphoretic and in pain.  When he originally called the office he was not sure if he was having fevers or chills.  He is currently not on antibiotics he reports.  Denies any systemic complaints such as fevers, chills, nausea, vomiting. No acute changes since last appointment, and no other complaints at this time.   Objective: AAO x3, NAD Ulceration submetatarsal on a granular base.  There is substantial increase in swelling and erythema and warmth of the right lower extremity.  There is no purulence identified  No pain with calf compression, swelling, warmth, erythema   Assessment: Significant cellulitis right lower extremity with ulceration  Plan: -All treatment options discussed with the patient including all alternatives, risks, complications.  -Given his overall appearance I recommend mission to the hospital for IV antibiotics.  A friend to bring him to the office today but given that he was lethargic, diaphoretic with high temperature I did not feel comfortable with him going by private car to the hospital.  EMS was called and came ER notified of his arrival. We will follow while inpatient.   Celesta Gentile, DPM

## 2019-08-08 NOTE — Progress Notes (Signed)
New Admission Note:   Arrival Method: Stretcher  Mental Orientation: Alert and orient Telemetry: Box 16  Assessment: Completed Skin: wound on Right sole of feet  IV: Right AC  Pain:  Tubes: none  Safety Measures: Safety Fall Prevention Plan has been given, discussed and signed Admission: Completed [facility] Orientation: Patient has been orientated to the room, unit and staff.  Family: none   Orders have been reviewed and implemented. Will continue to monitor the patient. Call light has been placed within reach and bed alarm has been activated.   Jayliani Wanner RN [facility] Phone: [phone]

## 2019-08-08 NOTE — ED Notes (Signed)
Straighten patient bed up patient is resting with call bell in reach

## 2019-08-08 NOTE — Plan of Care (Signed)
  Problem: Health Behavior/Discharge Planning: Goal: Ability to manage health-related needs will improve Outcome: Progressing   

## 2019-08-08 NOTE — ED Notes (Signed)
Pt removed all monitoring equipment, yelled at staff, dislodged his IV,  and appears frustrated in general. Pt provided with a meal, and reassured that we are doing everything we can to make him comfortable. Pt apologized for yelling at staff. Will continue to monitor.

## 2019-08-08 NOTE — ED Notes (Signed)
Pt provided w/ diet coke, snadwich, sugar free apple sauce

## 2019-08-08 NOTE — Plan of Care (Signed)
  Problem: Activity: Goal: Risk for activity intolerance will decrease Outcome: Progressing   

## 2019-08-08 NOTE — Consult Note (Signed)
[facility] Nurse Consult Note: Reason for Consult:cellulitis to right foot.  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  Followed by podiatry.  Wound type:infectious, neuropathic Pressure Injury POA: NA Measurement: 1 cm x 1 cm round nonintact wound with circumferential callous present,  Currently getting MRI to assess for osteomyelitis.  Wound SWV:TVNRWCH, 50% rubrous tissue 30% dark, devitalized tissue Drainage (amount, consistency, odor) minimal bleeding with cleansing.  Periwound:Right foot edematous, indurated across horizontal aspect and running along right lateral malleolus. Tender to touch.  Dressing procedure/placement/frequency: Cleanse right foot with NS and pat dry.  Apply aquacel to plantar foot wound.  Cover with dry gauze and kerlix/tape.  Change daily. Podiatry orders will supercede WOC orders if provided  Will not follow at this time.  Please re-consult if needed.  Domenic Moras MSN, RN, FNP-BC CWON Wound, Ostomy, Continence Nurse Pager [phone]

## 2019-08-09 ENCOUNTER — Encounter (HOSPITAL_COMMUNITY)
Admission: EM | Disposition: A | Discharge status: Home / Self Care | Payer: Self-pay | Source: Home / Self Care | Attending: Internal Medicine

## 2019-08-09 ENCOUNTER — Inpatient Hospital Stay (HOSPITAL_COMMUNITY): Payer: Medicaid Other | Admitting: Certified Registered Nurse Anesthetist

## 2019-08-09 ENCOUNTER — Encounter (HOSPITAL_COMMUNITY): Payer: Self-pay | Admitting: Internal Medicine

## 2019-08-09 DIAGNOSIS — L03115 Cellulitis of right lower limb: Secondary | ICD-10-CM

## 2019-08-09 HISTORY — PX: INCISION AND DRAINAGE: SHX5863

## 2019-08-09 LAB — BASIC METABOLIC PANEL
Anion gap: 12 (ref 5–15)
BUN: 28 mg/dL — ABNORMAL HIGH (ref 6–20)
CO2: 19 mmol/L — ABNORMAL LOW (ref 22–32)
Calcium: 8.7 mg/dL — ABNORMAL LOW (ref 8.9–10.3)
Chloride: 102 mmol/L (ref 98–111)
Creatinine, Ser: 1.43 mg/dL — ABNORMAL HIGH (ref 0.61–1.24)
GFR calc Af Amer: >60 mL/min (ref >60)
GFR calc non Af Amer: 54 mL/min — ABNORMAL LOW (ref >60)
Glucose, Bld: 234 mg/dL — ABNORMAL HIGH (ref 70–99)
Potassium: 4.1 mmol/L (ref 3.5–5.1)
Sodium: 133 mmol/L — ABNORMAL LOW (ref 135–145)

## 2019-08-09 LAB — CBC WITH DIFFERENTIAL/PLATELET
Abs Immature Granulocytes: 0.17 10*3/uL — ABNORMAL HIGH (ref 0.00–0.07)
Basophils Absolute: 0.1 10*3/uL (ref 0.0–0.1)
Basophils Relative: 0 %
Eosinophils Absolute: 0.2 10*3/uL (ref 0.0–0.5)
Eosinophils Relative: 1 %
HCT: 39.2 % (ref 39.0–52.0)
Hemoglobin: 12.3 g/dL — ABNORMAL LOW (ref 13.0–17.0)
Immature Granulocytes: 1 %
Lymphocytes Relative: 7 %
Lymphs Abs: 1.4 10*3/uL (ref 0.7–4.0)
MCH: 28.9 pg (ref 26.0–34.0)
MCHC: 31.4 g/dL (ref 30.0–36.0)
MCV: 92.2 fL (ref 80.0–100.0)
Monocytes Absolute: 1.2 10*3/uL — ABNORMAL HIGH (ref 0.1–1.0)
Monocytes Relative: 6 %
Neutro Abs: 16.1 10*3/uL — ABNORMAL HIGH (ref 1.7–7.7)
Neutrophils Relative %: 85 %
Platelets: 290 10*3/uL (ref 150–400)
RBC: 4.25 MIL/uL (ref 4.22–5.81)
RDW: 14.2 % (ref 11.5–15.5)
WBC: 19.1 10*3/uL — ABNORMAL HIGH (ref 4.0–10.5)
nRBC: 0 % (ref 0.0–0.2)

## 2019-08-09 LAB — PHOSPHORUS: Phosphorus: 3.4 mg/dL (ref 2.5–4.6)

## 2019-08-09 LAB — GLUCOSE, CAPILLARY
Glucose-Capillary: 104 mg/dL — ABNORMAL HIGH (ref 70–99)
Glucose-Capillary: 129 mg/dL — ABNORMAL HIGH (ref 70–99)
Glucose-Capillary: 147 mg/dL — ABNORMAL HIGH (ref 70–99)
Glucose-Capillary: 191 mg/dL — ABNORMAL HIGH (ref 70–99)
Glucose-Capillary: 211 mg/dL — ABNORMAL HIGH (ref 70–99)
Glucose-Capillary: 98 mg/dL (ref 70–99)
Glucose-Capillary: 99 mg/dL (ref 70–99)

## 2019-08-09 LAB — MAGNESIUM: Magnesium: 2.2 mg/dL (ref 1.7–2.4)

## 2019-08-09 SURGERY — INCISION AND DRAINAGE
Anesthesia: General | Laterality: Right

## 2019-08-09 MED ORDER — DEXAMETHASONE SODIUM PHOSPHATE 10 MG/ML IJ SOLN
INTRAMUSCULAR | Status: DC | PRN
  Administered 2019-08-09: 5 mg via INTRAVENOUS

## 2019-08-09 MED ORDER — MIDAZOLAM HCL 2 MG/2ML IJ SOLN
INTRAMUSCULAR | Status: DC | PRN
  Administered 2019-08-09: 2 mg via INTRAVENOUS

## 2019-08-09 MED ORDER — FENTANYL CITRATE (PF) 250 MCG/5ML IJ SOLN
INTRAMUSCULAR | Status: DC | PRN
  Administered 2019-08-09 (×3): 50 ug via INTRAVENOUS

## 2019-08-09 MED ORDER — PHENYLEPHRINE HCL-NACL 10-0.9 MG/250ML-% IV SOLN
INTRAVENOUS | Status: DC | PRN
  Administered 2019-08-09: 50 ug/min via INTRAVENOUS

## 2019-08-09 MED ORDER — CHLORHEXIDINE GLUCONATE CLOTH 2 % EX PADS: 6.0000 | MEDICATED_PAD | Freq: Once | CUTANEOUS | Status: DC

## 2019-08-09 MED ORDER — 0.9 % SODIUM CHLORIDE (POUR BTL) OPTIME
TOPICAL | Status: DC | PRN
  Administered 2019-08-09: 1000 mL

## 2019-08-09 MED ORDER — EPHEDRINE SULFATE 50 MG/ML IJ SOLN
INTRAMUSCULAR | Status: DC | PRN
  Administered 2019-08-09: 5 mg via INTRAVENOUS
  Administered 2019-08-09: 10 mg via INTRAVENOUS

## 2019-08-09 MED ORDER — LACTATED RINGERS IV SOLN: INTRAVENOUS | Status: DC

## 2019-08-09 MED ORDER — SUCCINYLCHOLINE CHLORIDE 200 MG/10ML IV SOSY
PREFILLED_SYRINGE | INTRAVENOUS | Status: DC | PRN
  Administered 2019-08-09: 160 mg via INTRAVENOUS

## 2019-08-09 MED ORDER — LIDOCAINE 2% (20 MG/ML) 5 ML SYRINGE
INTRAMUSCULAR | Status: DC | PRN
  Administered 2019-08-09: 100 mg via INTRAVENOUS

## 2019-08-09 MED ORDER — PHENYLEPHRINE HCL (PRESSORS) 10 MG/ML IV SOLN
INTRAVENOUS | Status: DC | PRN
  Administered 2019-08-09 (×2): 160 ug via INTRAVENOUS

## 2019-08-09 MED ORDER — LACTATED RINGERS IV SOLN: INTRAVENOUS | Status: DC | PRN

## 2019-08-09 MED ORDER — ONDANSETRON HCL 4 MG/2ML IJ SOLN: 4.0000 mg | Freq: Once | INTRAMUSCULAR | Status: DC | PRN

## 2019-08-09 MED ORDER — MIDAZOLAM HCL 2 MG/2ML IJ SOLN
INTRAMUSCULAR | Status: AC
  Filled 2019-08-09: qty 2

## 2019-08-09 MED ORDER — PROPOFOL 10 MG/ML IV BOLUS
INTRAVENOUS | Status: AC
  Filled 2019-08-09: qty 40

## 2019-08-09 MED ORDER — FENTANYL CITRATE (PF) 100 MCG/2ML IJ SOLN
INTRAMUSCULAR | Status: AC
  Filled 2019-08-09: qty 2

## 2019-08-09 MED ORDER — PROPOFOL 10 MG/ML IV BOLUS
INTRAVENOUS | Status: DC | PRN
  Administered 2019-08-09: 200 mg via INTRAVENOUS
  Administered 2019-08-09: 30 mg via INTRAVENOUS

## 2019-08-09 MED ORDER — FENTANYL CITRATE (PF) 100 MCG/2ML IJ SOLN: 25.0000 ug | INTRAMUSCULAR | Status: DC | PRN

## 2019-08-09 MED ORDER — VANCOMYCIN HCL 1000 MG IV SOLR
INTRAVENOUS | Status: AC
  Filled 2019-08-09: qty 1000

## 2019-08-09 MED ORDER — DEXTROSE-NACL 5-0.9 % IV SOLN: INTRAVENOUS | Status: DC

## 2019-08-09 MED ORDER — BUPIVACAINE HCL (PF) 0.5 % IJ SOLN
INTRAMUSCULAR | Status: DC | PRN
  Administered 2019-08-09: 10 mL

## 2019-08-09 MED ORDER — FENTANYL CITRATE (PF) 250 MCG/5ML IJ SOLN
INTRAMUSCULAR | Status: AC
  Filled 2019-08-09: qty 5

## 2019-08-09 MED ORDER — BUPIVACAINE HCL (PF) 0.5 % IJ SOLN
INTRAMUSCULAR | Status: AC
  Filled 2019-08-09: qty 30

## 2019-08-09 SURGICAL SUPPLY — 42 items
BNDG ELASTIC 4X5.8 VLCR STR LF (GAUZE/BANDAGES/DRESSINGS) IMPLANT
BNDG ELASTIC 6X15 VLCR STRL LF (GAUZE/BANDAGES/DRESSINGS) ×3 IMPLANT
BNDG ESMARK 4X9 LF (GAUZE/BANDAGES/DRESSINGS) IMPLANT
BNDG GAUZE ELAST 4 BULKY (GAUZE/BANDAGES/DRESSINGS) ×6 IMPLANT
CHLORAPREP W/TINT 26 (MISCELLANEOUS) ×3 IMPLANT
COVER SURGICAL LIGHT HANDLE (MISCELLANEOUS) ×3 IMPLANT
COVER WAND RF STERILE (DRAPES) IMPLANT
CUFF TOURN SGL QUICK 18X4 (TOURNIQUET CUFF) IMPLANT
CUFF TOURN SGL QUICK 34 (TOURNIQUET CUFF)
CUFF TRNQT CYL 34X4.125X (TOURNIQUET CUFF) IMPLANT
DRAPE U-SHAPE 47X51 STRL (DRAPES) ×3 IMPLANT
ELECT CAUTERY BLADE 6.4 (BLADE) ×3 IMPLANT
ELECT REM PT RETURN 9FT ADLT (ELECTROSURGICAL) ×3
ELECTRODE REM PT RTRN 9FT ADLT (ELECTROSURGICAL) ×1 IMPLANT
GAUZE SPONGE 4X4 12PLY STRL (GAUZE/BANDAGES/DRESSINGS) IMPLANT
GAUZE SPONGE 4X4 12PLY STRL LF (GAUZE/BANDAGES/DRESSINGS) ×6 IMPLANT
GAUZE XEROFORM 5X9 LF (GAUZE/BANDAGES/DRESSINGS) ×3 IMPLANT
GLOVE BIO SURGEON STRL SZ7.5 (GLOVE) ×3 IMPLANT
GLOVE BIOGEL PI IND STRL 8 (GLOVE) ×1 IMPLANT
GLOVE BIOGEL PI INDICATOR 8 (GLOVE) ×2
GOWN STRL REUS W/ TWL LRG LVL3 (GOWN DISPOSABLE) ×1 IMPLANT
GOWN STRL REUS W/ TWL XL LVL3 (GOWN DISPOSABLE) ×1 IMPLANT
GOWN STRL REUS W/TWL LRG LVL3 (GOWN DISPOSABLE) ×3
GOWN STRL REUS W/TWL XL LVL3 (GOWN DISPOSABLE) ×3
KIT BASIN OR (CUSTOM PROCEDURE TRAY) ×3 IMPLANT
KIT TURNOVER KIT B (KITS) ×3 IMPLANT
MANIFOLD NEPTUNE II (INSTRUMENTS) ×3 IMPLANT
NEEDLE BIOPSY JAMSHIDI 8X6 (NEEDLE) IMPLANT
NEEDLE HYPO 25GX1X1/2 BEV (NEEDLE) IMPLANT
NS IRRIG 1000ML POUR BTL (IV SOLUTION) ×3 IMPLANT
PACK ORTHO EXTREMITY (CUSTOM PROCEDURE TRAY) ×3 IMPLANT
PAD ABD 7.5X8 STRL (GAUZE/BANDAGES/DRESSINGS) ×3 IMPLANT
PAD ARMBOARD 7.5X6 YLW CONV (MISCELLANEOUS) IMPLANT
PROBE DEBRIDE SONICVAC MISONIX (TIP) IMPLANT
SET CYSTO W/LG BORE CLAMP LF (SET/KITS/TRAYS/PACK) ×3 IMPLANT
SOL PREP POV-IOD 4OZ 10% (MISCELLANEOUS) ×6 IMPLANT
SYR CONTROL 10ML LL (SYRINGE) IMPLANT
TOWEL GREEN STERILE (TOWEL DISPOSABLE) ×3 IMPLANT
TOWEL GREEN STERILE FF (TOWEL DISPOSABLE) ×3 IMPLANT
TUBE CONNECTING 12'X1/4 (SUCTIONS) ×1
TUBE CONNECTING 12X1/4 (SUCTIONS) ×2 IMPLANT
YANKAUER SUCT BULB TIP NO VENT (SUCTIONS) ×3 IMPLANT

## 2019-08-09 NOTE — Progress Notes (Signed)
Patient refused bed alarm. Explained to patient that he had anesthesia and might  still be a little woozy from it. Patient states " I won't get up". Patient at side of bed using urinal. Call bell within reach. Kathya Wilz, Wonda Cheng, Therapist, sports

## 2019-08-09 NOTE — Brief Op Note (Signed)
08/09/2019  9:03 PM  PATIENT:  Jeffery [name]  59 y.o. male  PRE-OPERATIVE DIAGNOSIS:  Right foot abscess/ulcer  POST-OPERATIVE DIAGNOSIS:  Right foot abscess/ulcer  PROCEDURE:  Procedure(s): INCISION AND DRAINAGE RIGHT FOOT (Right)  SURGEON:  Surgeon(s) and Role:    * Trula Slade, DPM - Primary  PHYSICIAN ASSISTANT:   ASSISTANTS: none   ANESTHESIA:   general  EBL:  15 cc   BLOOD ADMINISTERED:none  DRAINS: none   LOCAL MEDICATIONS USED:  OTHER 10 cc lidocaine and marcaine plain  SPECIMEN:  Source of Specimen:  wound culture right foot  DISPOSITION OF SPECIMEN:  PATHOLOGY  COUNTS:  YES  TOURNIQUET:  * No tourniquets in log *  DICTATION: .Dragon Dictation  PLAN OF CARE: Admit to inpatient   PATIENT DISPOSITION:  PACU - hemodynamically stable.   Delay start of Pharmacological VTE agent (>24hrs) due to surgical blood loss or risk of bleeding: no

## 2019-08-09 NOTE — Progress Notes (Signed)
Again reviewed the surgery with the patient including risks, complications. He has no further questions or concerns. NPO since around noon. Surgical consent signed. Will plan for surgery as planned.  He has no family or friends he wants me to contact postop.

## 2019-08-09 NOTE — Anesthesia Procedure Notes (Signed)
Procedure Name: Intubation Date/Time: 08/09/2019 8:22 PM Performed by: Oletta Lamas, CRNA Pre-anesthesia Checklist: Patient identified, Emergency Drugs available, Suction available and Patient being monitored Patient Re-evaluated:Patient Re-evaluated prior to induction Oxygen Delivery Method: Circle System Utilized Preoxygenation: Pre-oxygenation with 100% oxygen Induction Type: IV induction Ventilation: Mask ventilation without difficulty Laryngoscope Size: Mac and 4 Grade View: Grade I Tube type: Oral Number of attempts: 1 Airway Equipment and Method: Stylet and Oral airway Placement Confirmation: ETT inserted through vocal cords under direct vision,  positive ETCO2 and breath sounds checked- equal and bilateral Secured at: 23 cm Tube secured with: Tape Dental Injury: Teeth and Oropharynx as per pre-operative assessment

## 2019-08-09 NOTE — Op Note (Signed)
PATIENT:  Jeffery [name]  59 y.o. male  PRE-OPERATIVE DIAGNOSIS:  Right foot abscess/ulcer  POST-OPERATIVE DIAGNOSIS:  Right foot abscess/ulcer  PROCEDURE:  Procedure(s): INCISION AND DRAINAGE RIGHT FOOT (Right)  SURGEON:  Surgeon(s) and Role:    * Trula Slade, DPM - Primary  PHYSICIAN ASSISTANT:   ASSISTANTS: none   ANESTHESIA:   general  EBL:  15 cc   BLOOD ADMINISTERED:none  DRAINS: none   LOCAL MEDICATIONS USED:  OTHER 10 cc lidocaine and marcaine plain  SPECIMEN:  Source of Specimen:  wound culture right foot  DISPOSITION OF SPECIMEN:  PATHOLOGY  COUNTS:  YES  TOURNIQUET:  * No tourniquets in log *  DICTATION: .Dragon Dictation  PLAN OF CARE: Admit to inpatient   PATIENT DISPOSITION:  PACU - hemodynamically stable.   Delay start of Pharmacological VTE agent (>24hrs) due to surgical blood loss or risk of bleeding: no  Indication for surgery: Patient has had a chronic right foot submetatarsal 1 ulceration.  This did get larger last appointment but appear to be stable.  He for on Thursday he started increasing pain to the foot on Friday it worsened as well as over the weekend.  He waited until Monday to call the office and upon evaluation he appeared to be septic and he was admitted to the hospital after transported via EMS.  He had an MRI performed did not reveal osteomyelitis but a ganglion cyst which is likely from abscess clinically.  Due to the infection upon evaluation today recommended surgery today for incision and drainage, debridement.  I discussed the surgical postoperative course.  Alternatives, risks, complications were discussed.  No promises or guarantees were given affecting the procedure.  Procedure in detail: The patient was both verbally and visually identified by myself, the nursing staff, the anesthesia staff preoperatively.  He was then transferred to the operating room via stretcher.  After he was intubated the right lower extremity  was then scrubbed, prepped, draped in normal sterile fashion.  Timeout was performed.  At this time I did instructed to the right foot submetatarsal 1 ulceration.  Sharply debrided this wound utilizing a #10 blade scalpel.  There is a granular wound base.  There is no probing, undermining or tunneling.  There is no surrounding erythema to this area there is no fluctuation crepitation.  Attention was then directed to the dorsal lateral aspect of the right foot along the area of abscess.  An incision was made with a 15 by scalpel and upon initial incision significant purulence was identified. Wound culture obtained.  Incision approximately 3 cm in length was made along this area.  I was able to decompress the wound and express significant purulence.  The wound did track proximally, medially, distally. Upon decompression of the wound there was no further purulence in the did not appear to be going into the ankle.  Utilized a rongeur as well as a 15 blade scalpel to debride the soft tissue.  After debridement and decompression of the abscess no further purulence was identified and only bloody drainage.  I did make 2 small stab incisions one along the second interspace and one along the fifth metatarsal head due to the swelling.  No purulence was identified only bloody drainage.  A liter of saline was utilized to irrigate the wounds.  I did close the 2 stab incisions with Prolene.  Hemostasis was achieved from the dorsal lateral wound.  Is packed with a saline 4 x 4 moistened gauze, with the  tail outside of the wound.  The wound was then dressed with 4 x 4's, ABD pad, Kerlix, Ace.  He was awoken from anesthesia and found to tolerate the procedure well without complications.  Transferred to PACU less than stable and vascular status intact.  Postoperative plan: Remain inpatient IV antibiotics.  Likely return to the OR on Friday for debridement.

## 2019-08-09 NOTE — Progress Notes (Signed)
Patient's mother updated via phone

## 2019-08-09 NOTE — Anesthesia Preprocedure Evaluation (Addendum)
Anesthesia Evaluation  Patient identified by MRN, date of birth, ID band Patient awake    Reviewed: Allergy & Precautions, NPO status , Patient's Chart, lab work & pertinent test results, reviewed documented beta blocker date and time   Airway Mallampati: II  TM Distance: >3 FB Neck ROM: Full    Dental  (+) Teeth Intact, Dental Advisory Given, Chipped,    Pulmonary neg pulmonary ROS, former smoker,    Pulmonary exam normal breath sounds clear to auscultation       Cardiovascular hypertension, Pt. on home beta blockers Normal cardiovascular exam+ dysrhythmias Atrial Fibrillation  Rhythm:Regular Rate:Normal     Neuro/Psych  Neuromuscular disease negative psych ROS   GI/Hepatic negative GI ROS, Neg liver ROS,   Endo/Other  diabetes, Insulin DependentMorbid obesity  Renal/GU Renal disease     Musculoskeletal  (+) Arthritis ,   Abdominal   Peds  Hematology  (+) Blood dyscrasia (Eliquis), anemia ,   Anesthesia Other Findings Day of surgery medications reviewed with the patient.  Reproductive/Obstetrics                            Anesthesia Physical Anesthesia Plan  ASA: III  Anesthesia Plan: General   Post-op Pain Management:    Induction: Intravenous  PONV Risk Score and Plan: 3 and Midazolam, Ondansetron and Dexamethasone  Airway Management Planned: Oral ETT  Additional Equipment:   Intra-op Plan:   Post-operative Plan: Extubation in OR  Informed Consent: I have reviewed the patients History and Physical, chart, labs and discussed the procedure including the risks, benefits and alternatives for the proposed anesthesia with the patient or authorized representative who has indicated his/her understanding and acceptance.     Dental advisory given  Plan Discussed with: CRNA  Anesthesia Plan Comments:         Anesthesia Quick Evaluation

## 2019-08-09 NOTE — Transfer of Care (Signed)
Immediate Anesthesia Transfer of Care Note  Patient: EMERIC NOVINGER  Procedure(s) Performed: INCISION AND DRAINAGE RIGHT FOOT (Right )  Patient Location: PACU  Anesthesia Type:General  Level of Consciousness: awake, alert , oriented and patient cooperative  Airway & Oxygen Therapy: Patient Spontanous Breathing and Patient connected to face mask oxygen  Post-op Assessment: Report given to RN and Post -op Vital signs reviewed and stable  Post vital signs: Reviewed and stable  Last Vitals:  Vitals Value Taken Time  BP    Temp    Pulse 73 08/09/19 2112  Resp 20 08/09/19 2112  SpO2 98 % 08/09/19 2112  Vitals shown include unvalidated device data.  Last Pain:  Vitals:   08/09/19 1746  TempSrc:   PainSc: 2       Patients Stated Pain Goal: 0 (42/10/31 2811)  Complications: No apparent anesthesia complications

## 2019-08-09 NOTE — Anesthesia Postprocedure Evaluation (Signed)
Anesthesia Post Note  Patient: Jeffery [name]  Procedure(s) Performed: INCISION AND DRAINAGE RIGHT FOOT (Right )     Patient location during evaluation: PACU Anesthesia Type: General Level of consciousness: awake and alert Pain management: pain level controlled Vital Signs Assessment: post-procedure vital signs reviewed and stable Respiratory status: spontaneous breathing, nonlabored ventilation and respiratory function stable Cardiovascular status: blood pressure returned to baseline and stable Postop Assessment: no apparent nausea or vomiting Anesthetic complications: no    Last Vitals:  Vitals:   08/09/19 2145 08/09/19 2201  BP: 117/79 134/76  Pulse: 74 74  Resp: 17 18  Temp: 36.9 C 36.7 C  SpO2: 97% 95%    Last Pain:  Vitals:   08/09/19 2201  TempSrc: Oral  PainSc:                  [name]

## 2019-08-09 NOTE — Plan of Care (Signed)
  Problem: Clinical Measurements: Goal: Will remain free from infection Outcome: Not Progressing  Patient for I&D tonight, NPO.

## 2019-08-09 NOTE — Progress Notes (Signed)
Subjective: 59 year old male with chronic right foot submetatarsal 1 ulceration was admitted to the hospital Monday evening for sepsis, cellulitis.  White blood cell count.  Yesterday however today is 19.  MRI was performed yesterday not reveal osteomyelitis but ganglion cyst.  Overall feels better and denies any systemic complaints such as fevers, chills, nausea, vomiting. No acute changes since last appointment, and no other complaints at this time.   Objective: AAO x3, NAD Difficulty palpate pulses because of the swelling.  There is decreased erythema to the leg but still erythema to the dorsal aspect foot.  There is a large blister on the dorsal lateral aspect the foot which are draining clear fluid is expressed.  There does not appear to be clinically an abscess this area.  Submetatarsal 1 ulceration with granular base.  No probing.  No surrounding erythema plantarly. No pain with calf compression, swelling, warmth, erythema  Assessment: Chronic ulceration right foot submetatarsal 1; right foot abscess, cellulitis  Plan: -I reviewed the MRI with him.  His white blood cell count has slightly increased today.  Although he is afebrile currently and feeling better.  Given the infection I recommend incision and drainage, wound debridement.  He ate around 12 PM today and the plan on doing this at 8 PM tonight.  Discussed with the operating room today.  We discussed the surgery as well as postoperative course.  Discussed risks of surgery including, but not limited to, spread of infection, need for further surgery, delayed or nonhealing, amputation as well as general risks of surgery including stroke, heart attack, death. -N.p.o. -Consent to be signed  Celesta Gentile, DPM O: [phone] C: [phone]

## 2019-08-09 NOTE — Progress Notes (Signed)
PROGRESS NOTE    CANTON YEARBY  WUJ:[id number] DOB: [birth year] DOA: 08/07/2019 PCP: [name], MD    Brief Narrative:  59 year old gentleman with history of type 2 diabetes on insulin, hypertension, hyperlipidemia, paroxysmal atrial fibrillation status post cardioversion currently on Eliquis presented to the emergency department with persistent right foot ulcer and pain and swelling.  In the emergency room, found to have significant swelling, suspected osteomyelitis and admitted with IV antibiotics.   Assessment & Plan:   Principal Problem:   Severe sepsis with acute organ dysfunction due to methicillin susceptible Staphylococcus aureus (MSSA) (HCC) Active Problems:   Essential hypertension   Uncontrolled type 2 diabetes mellitus with diabetic polyneuropathy, without long-term current use of insulin (HCC)   Paroxysmal atrial fibrillation (HCC)   Right foot ulcer, with fat layer exposed ([place])   Cellulitis of right lower extremity   Mixed diabetic hyperlipidemia associated with type 2 diabetes mellitus ([place])   AKI (acute kidney injury) ([place])   Sepsis due to methicillin susceptible Staphylococcus aureus (MSSA) with acute renal failure ([place])  Sepsis present on admission due to localized foot infection with acute renal failure: Clinically improving.  Remains on vancomycin.  Previous culture with MSSA. Continue vancomycin. MRI demonstrated local soft tissue inflammation, no osteomyelitis. Followed by podiatry, will defer to surgery if he needs any local debridement.  Surgical cultures will be appreciated if he goes for surgery.  Acute kidney injury: Due to sepsis.  Hydrated with intravenous fluid.  Renal functions improving.  Holding ARB.  Continue to monitor.  Hypertension: Blood pressures fairly stable.  Continue beta-blockers.  Holding other antihypertensives.  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  A1c 7.1.  Continue home  insulin doses.  Paroxysmal atrial fibrillation: Currently sinus rhythm on metoprolol and Eliquis that he will continue.   DVT prophylaxis: Eliquis Code Status: Full code Family Communication: None Disposition Plan: Status is: Inpatient  Remains inpatient appropriate because:IV treatments appropriate due to intensity of illness or inability to take PO   Dispo: The patient is from: Home              Anticipated d/c is to: Home              Anticipated d/c date is: 2 days              Patient currently is not medically stable to d/c.          Consultants:   Podiatry  Procedures:   None  Antimicrobials:  Anti-infectives (From admission, onward)   Start     Dose/Rate Route Frequency Ordered Stop   08/09/19 1000  vancomycin (VANCOREADY) IVPB 1500 mg/300 mL     1,500 mg 150 mL/hr over 120 Minutes Intravenous Every 24 hours 08/08/19 0909     08/08/19 0800  vancomycin (VANCOREADY) IVPB 2000 mg/400 mL  Status:  Discontinued     2,000 mg 200 mL/hr over 120 Minutes Intravenous Every 24 hours 08/08/19 0530 08/08/19 0909   08/07/19 2130  vancomycin (VANCOCIN) IVPB 1000 mg/200 mL premix     1,000 mg 200 mL/hr over 60 Minutes Intravenous  Once 08/07/19 2120 08/07/19 2312         Subjective: Patient seen and examined.  No overnight events.  Patient states that still has significant swelling however this is better than his previous swelling.  Patient thinks the calf and ankles are improving.  He was wondering whether he can take shower with the dressing.  Afebrile.  Objective: Vitals:   08/08/19 2245 08/09/19 0254 08/09/19 0541 08/09/19 0947  BP: (!) 110/50 106/69 124/77 (!) 117/52  Pulse: 80 71 71 77  Resp: 18 17 18 18   Temp: 98 F (36.7 C) 98 F (36.7 C) 98.4 F (36.9 C) 97.7 F (36.5 C)  TempSrc: Oral Oral  Oral  SpO2: 100% 95% 94% 100%  Weight:      Height:        Intake/Output Summary (Last 24 hours) at 08/09/2019 1224 Last data filed at 08/09/2019 1000 Gross  per 24 hour  Intake 2691.66 ml  Output 2675 ml  Net 16.66 ml   Filed Weights   08/08/19 0900 08/08/19 1433  Weight: (!) 160.1 kg (!) 161.1 kg    Examination:  General exam: Appears calm and comfortable, on room air. Respiratory system: Clear to auscultation. Respiratory effort normal. Cardiovascular system: S1 & S2 heard, RRR.  Gastrointestinal system: Abdomen is nondistended, soft and nontender.  Obese and pendulous Central nervous system: Alert and oriented. No focal neurological deficits. Extremities: Symmetric 5 x 5 power. Skin: Lower leg and ankle along with foot still with significant swelling and erythema.         Data Reviewed: I have personally reviewed following labs and imaging studies  CBC: Recent Labs  Lab 08/07/19 1900 08/08/19 0354 08/09/19 1010  WBC 21.8* 18.4* 19.1*  NEUTROABS 18.9* 15.5* 16.1*  HGB 12.8* 11.8* 12.3*  HCT 40.1 37.9* 39.2  MCV 93.0 94.3 92.2  PLT 346 283 614   Basic Metabolic Panel: Recent Labs  Lab 08/07/19 1900 08/08/19 0354 08/09/19 1010  NA 132* 133* 133*  K 4.2 3.9 4.1  CL 96* 99 102  CO2 22 21* 19*  GLUCOSE 302* 329* 234*  BUN 19 28* 28*  CREATININE 1.46* 2.31* 1.43*  CALCIUM 8.9 8.4* 8.7*  MG  --  2.1 2.2  PHOS  --   --  3.4   GFR: Estimated Creatinine Clearance: 91.7 mL/min (A) (by C-G formula based on SCr of 1.43 mg/dL (H)). Liver Function Tests: Recent Labs  Lab 08/07/19 1900 08/08/19 0354  AST 28 27  ALT 33 31  ALKPHOS 82 79  BILITOT 1.2 1.0  PROT 8.0 7.1  ALBUMIN 2.9* 2.5*   No results for input(s): LIPASE, AMYLASE in the last 168 hours. No results for input(s): AMMONIA in the last 168 hours. Coagulation Profile: Recent Labs  Lab 08/07/19 1900  INR 1.7*   Cardiac Enzymes: No results for input(s): CKTOTAL, CKMB, CKMBINDEX, TROPONINI in the last 168 hours. BNP (last 3 results) No results for input(s): PROBNP in the last 8760 hours. HbA1C: Recent Labs    08/08/19 1425  HGBA1C 7.1*    CBG: Recent Labs  Lab 08/08/19 1427 08/08/19 1624 08/08/19 2139 08/09/19 0634 08/09/19 1113  GLUCAP 279* 271* 233* 104* 191*   Lipid Profile: No results for input(s): CHOL, HDL, LDLCALC, TRIG, CHOLHDL, LDLDIRECT in the last 72 hours. Thyroid Function Tests: No results for input(s): TSH, T4TOTAL, FREET4, T3FREE, THYROIDAB in the last 72 hours. Anemia Panel: No results for input(s): VITAMINB12, FOLATE, FERRITIN, TIBC, IRON, RETICCTPCT in the last 72 hours. Sepsis Labs: Recent Labs  Lab 08/07/19 1900 08/07/19 2145  LATICACIDVEN 1.9 1.4    Recent Results (from the past 240 hour(s))  Culture, blood (Routine x 2)     Status: None (Preliminary result)   Collection Time: 08/07/19  7:00 PM   Specimen: BLOOD  Result Value Ref Range Status   Specimen Description BLOOD LEFT  ARM  Final   Special Requests   Final    BOTTLES DRAWN AEROBIC AND ANAEROBIC Blood Culture results may not be optimal due to an inadequate volume of blood received in culture bottles   Culture   Final    NO GROWTH 2 DAYS Performed at [facility], [place] [address], [place], [place] [address]    Report Status PENDING  Incomplete  Culture, blood (Routine x 2)     Status: None (Preliminary result)   Collection Time: 08/07/19  7:02 PM   Specimen: BLOOD  Result Value Ref Range Status   Specimen Description BLOOD RIGHT HAND  Final   Special Requests   Final    BOTTLES DRAWN AEROBIC AND ANAEROBIC Blood Culture results may not be optimal due to an inadequate volume of blood received in culture bottles   Culture   Final    NO GROWTH 2 DAYS Performed at [facility], [place] [address], [place], [place] [address]    Report Status PENDING  Incomplete  SARS Coronavirus 2 by RT PCR (hospital order, performed in [facility] hospital lab) Nasopharyngeal Nasopharyngeal Swab     Status: None   Collection Time: 08/07/19  9:25 PM   Specimen: Nasopharyngeal Swab  Result Value Ref Range Status   SARS Coronavirus 2  NEGATIVE NEGATIVE Final    Comment: (NOTE) SARS-CoV-2 target nucleic acids are NOT DETECTED. The SARS-CoV-2 RNA is generally detectable in upper and lower respiratory specimens during the acute phase of infection. The lowest concentration of SARS-CoV-2 viral copies this assay can detect is 250 copies / mL. A negative result does not preclude SARS-CoV-2 infection and should not be used as the sole basis for treatment or other patient management decisions.  A negative result may occur with improper specimen collection / handling, submission of specimen other than nasopharyngeal swab, presence of viral mutation(s) within the areas targeted by this assay, and inadequate number of viral copies (<250 copies / mL). A negative result must be combined with clinical observations, patient history, and epidemiological information. Fact Sheet for Patients:   StrictlyIdeas.no Fact Sheet for Healthcare Providers: BankingDealers.co.za This test is not yet approved or cleared  by the [place] FDA and has been authorized for detection and/or diagnosis of SARS-CoV-2 by FDA under an Emergency Use Authorization (EUA).  This EUA will remain in effect (meaning this test can be used) for the duration of the COVID-19 declaration under Section 564(b)(1) of the Act, 21 U.S.C. section 360bbb-3(b)(1), unless the authorization is terminated or revoked sooner. Performed at [facility], [place] [address], [place], [place] [address]   Urine culture     Status: None (Preliminary result)   Collection Time: 08/08/19  5:36 AM   Specimen: In/Out Cath Urine  Result Value Ref Range Status   Specimen Description IN/OUT CATH URINE  Final   Special Requests NONE  Final   Culture   Final    CULTURE REINCUBATED FOR BETTER GROWTH Performed at [facility], [place] [address], [place], [place] [address]    Report Status PENDING  Incomplete         Radiology  Studies: MR FOOT RIGHT WO CONTRAST  Result Date: 08/08/2019 CLINICAL DATA:  Ulcer at the bottom of the right foot. Ulcers located at the base of the great toe. Ongoing pain which is worsening. EXAM: MRI OF THE RIGHT FOREFOOT WITHOUT CONTRAST TECHNIQUE: Multiplanar, multisequence MR imaging of the right forefoot was performed. No intravenous contrast  was administered. COMPARISON:  None. FINDINGS: Bones/Joint/Cartilage Soft tissue ulcer at the plantar base of the first MTP joint. No focal marrow signal abnormality to suggest osteomyelitis. No cortical destruction or osteolysis. No periosteal reaction. No fracture or dislocation. Normal alignment. No joint effusion. Severe osteoarthritis of the third, fourth and fifth tarsometatarsal joints with subchondral reactive marrow edema. Mild-moderate osteoarthritis of the first and second tarsometatarsal joints with subchondral marrow edema. Severe fragmentation of the navicular with advanced arthropathy of the talonavicular joint and naviculocuneiform joint. Mild-moderate osteoarthritis of the calcaneocuboid joint. Ligaments Collateral ligaments are intact. Lisfranc ligament is intact. Muscles and Tendons Flexor, peroneal and extensor compartment tendons are intact. Generalized muscle atrophy. Soft tissue 2.2 x 4 x 4.2 cm multiloculated fluid collection along the dorsal lateral aspect of the foot at the level of the fourth and fifth TMT joints concerning for a large ganglion cyst. Nonspecific soft tissue edema of the dorsal aspect of the forefoot. No soft tissue mass. No hematoma. IMPRESSION: 1. Soft tissue ulcer at the plantar base of the first MTP joint. No evidence of osteomyelitis. 2. 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 3. A 2.2 x 4 x 4.2 cm multiloculated fluid collection along the dorsal lateral aspect of the foot at the level of the fourth and fifth TMT joints concerning for a large ganglion cyst. Electronically Signed   By: Kathreen Devoid    On: 08/08/2019 11:01   DG Foot Complete Right  Result Date: 08/07/2019 CLINICAL DATA:  Foot infection, chronic EXAM: RIGHT FOOT COMPLETE - 3+ VIEW COMPARISON:  June 28, 2019 FINDINGS: Again noted is advanced fragmentation and arthropathy at the midfoot. A ossicle is seen at the superior talonavicular joint. There is inferior migration at the talonavicular joint with flattening and advanced fragmentation of the navicular. Diffuse soft tissue swelling is seen within the plantar surface of the midfoot and the dorsum of the midfoot. No subcutaneous emphysema is seen. Tibiotalar joint osteoarthritis is noted. There is diffuse osteopenia and pes planus deformity. 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 No definite acute osseous abnormality. Worsening diffuse soft tissue swelling seen on the dorsum and plantar surface of the midfoot Electronically Signed   By: Prudencio Pair M.D.   On: 08/07/2019 22:02        Scheduled Meds: . acetaminophen  1,000 mg Oral Once  . amiodarone  200 mg Oral BID  . apixaban  5 mg Oral BID  . atorvastatin  20 mg Oral Daily  . insulin aspart  0-20 Units Subcutaneous TID AC & HS  . insulin aspart  8 Units Subcutaneous TID WC  . insulin glargine  51 Units Subcutaneous BID  . metoprolol tartrate  100 mg Oral BID   Continuous Infusions: . vancomycin 1,500 mg (08/09/19 1012)     LOS: 1 day    Time spent: 25 minutes    Barb Merino, MD [facility] Pager [phone]

## 2019-08-09 NOTE — Progress Notes (Signed)
RN attempted to change patients right foot dressing and patient verbalized that he wants to take a shower before getting his dressing changed.

## 2019-08-10 LAB — CBC WITH DIFFERENTIAL/PLATELET
Abs Immature Granulocytes: 0.19 10*3/uL — ABNORMAL HIGH (ref 0.00–0.07)
Basophils Absolute: 0 10*3/uL (ref 0.0–0.1)
Basophils Relative: 0 %
Eosinophils Absolute: 0 10*3/uL (ref 0.0–0.5)
Eosinophils Relative: 0 %
HCT: 37.9 % — ABNORMAL LOW (ref 39.0–52.0)
Hemoglobin: 12 g/dL — ABNORMAL LOW (ref 13.0–17.0)
Immature Granulocytes: 1 %
Lymphocytes Relative: 4 %
Lymphs Abs: 0.8 10*3/uL (ref 0.7–4.0)
MCH: 29 pg (ref 26.0–34.0)
MCHC: 31.7 g/dL (ref 30.0–36.0)
MCV: 91.5 fL (ref 80.0–100.0)
Monocytes Absolute: 0.5 10*3/uL (ref 0.1–1.0)
Monocytes Relative: 3 %
Neutro Abs: 17.2 10*3/uL — ABNORMAL HIGH (ref 1.7–7.7)
Neutrophils Relative %: 92 %
Platelets: 330 10*3/uL (ref 150–400)
RBC: 4.14 MIL/uL — ABNORMAL LOW (ref 4.22–5.81)
RDW: 14.4 % (ref 11.5–15.5)
WBC: 18.7 10*3/uL — ABNORMAL HIGH (ref 4.0–10.5)
nRBC: 0 % (ref 0.0–0.2)

## 2019-08-10 LAB — BASIC METABOLIC PANEL
Anion gap: 10 (ref 5–15)
BUN: 27 mg/dL — ABNORMAL HIGH (ref 6–20)
CO2: 19 mmol/L — ABNORMAL LOW (ref 22–32)
Calcium: 8.9 mg/dL (ref 8.9–10.3)
Chloride: 105 mmol/L (ref 98–111)
Creatinine, Ser: 1.38 mg/dL — ABNORMAL HIGH (ref 0.61–1.24)
GFR calc Af Amer: >60 mL/min (ref >60)
GFR calc non Af Amer: 56 mL/min — ABNORMAL LOW (ref >60)
Glucose, Bld: 347 mg/dL — ABNORMAL HIGH (ref 70–99)
Potassium: 5.3 mmol/L — ABNORMAL HIGH (ref 3.5–5.1)
Sodium: 134 mmol/L — ABNORMAL LOW (ref 135–145)

## 2019-08-10 LAB — GLUCOSE, CAPILLARY
Glucose-Capillary: 285 mg/dL — ABNORMAL HIGH (ref 70–99)
Glucose-Capillary: 404 mg/dL — ABNORMAL HIGH (ref 70–99)
Glucose-Capillary: 282 mg/dL — ABNORMAL HIGH (ref 70–99)
Glucose-Capillary: 226 mg/dL — ABNORMAL HIGH (ref 70–99)

## 2019-08-10 LAB — URINE CULTURE: Culture: 100 — AB

## 2019-08-10 MED ORDER — INSULIN ASPART 100 UNIT/ML ~~LOC~~ SOLN
12.0000 [IU] | Freq: Three times a day (TID) | SUBCUTANEOUS | Status: DC
  Administered 2019-08-10 – 2019-08-14 (×11): 12 [IU] via SUBCUTANEOUS

## 2019-08-10 MED ORDER — VANCOMYCIN HCL 1250 MG/250ML IV SOLN
1250.0000 mg | Freq: Two times a day (BID) | INTRAVENOUS | Status: DC
  Administered 2019-08-10 – 2019-08-11 (×3): 1250 mg via INTRAVENOUS
  Filled 2019-08-10 (×4): qty 250

## 2019-08-10 MED ORDER — SODIUM CHLORIDE 0.9 % IV SOLN
INTRAVENOUS | Status: DC | PRN
  Administered 2019-08-10: 250 mL via INTRAVENOUS

## 2019-08-10 NOTE — Plan of Care (Signed)
  Problem: Activity: Goal: Risk for activity intolerance will decrease Outcome: Progressing   

## 2019-08-10 NOTE — Progress Notes (Signed)
Pharmacy Antibiotic Note  Jeffery [name] is a 59 y.o. male admitted on 08/07/2019 with cellulitis.  Pharmacy consulted on 5/11 for vancomycin dosing. Pt is afebrile, Tm 99.7,  WBC  elevated at 18.7. Scr has trended down, 1.38 today.  Estimated CrCl ~95 ml/min. UOP 2175 ml (0.6 ml/kg/hr) 5/12,  UOP 950 ml ,0.80ml/kg/hr so far 5/13. 5/12: foot abscess >>GPC in pairs, pending. Blood cx no growth to date.   Renal function has improved , will adjust vancomycin dose.   Plan: Change vancomycin to 1250 mg IV Q12H F/u renal fxn, C&S, clinical status and peak/trough at SS  Height: 6' 2.02" (188 cm) Weight: (!) 161.1 kg (355 lb 2.6 oz) IBW/kg (Calculated) : 82.24  Temp (24hrs), Avg:98.6 F (37 C), Min:97.6 F (36.4 C), Max:99.7 F (37.6 C)  Recent Labs  Lab 08/07/19 1900 08/07/19 2145 08/08/19 0354 08/09/19 1010 08/10/19 0517  WBC 21.8*  --  18.4* 19.1* 18.7*  CREATININE 1.46*  --  2.31* 1.43* 1.38*  LATICACIDVEN 1.9 1.4  --   --   --     Estimated Creatinine Clearance: 95.1 mL/min (A) (by C-G formula based on SCr of 1.38 mg/dL (H)).    Allergies  Allergen Reactions  . Metformin And Related Other (See Comments)    GI upset    Antimicrobials this admission: Vanc 5/10>>  Dose adjustments this admission: 5/13 Scr improved to 1.38, Adjust Vanc to 1250mg  IV Q12H (AUC 505.9, SCr 1.38, Vd 0.5)  Microbiology results: 5/10 BCX: ngtd x 3d 5/11 Ucx: 100 colonies of enterobacter - not treating.  No symptoms 5/10 Covid: neg 5/12: foot abscess >>GPC in pairs, pending  Thank you for allowing pharmacy to be a part of this patient's care.  Nicole Cella, RPh Clinical Pharmacist [phone] Please check AMION for all [place] phone numbers After 10:00 PM, call [place] [phone]  08/10/2019 3:31 PM

## 2019-08-10 NOTE — Progress Notes (Signed)
   Jeffery [name] MRN: [id number] DOB: July 24, 1960 DOA: 08/07/2019  PODIATRY PROGRESS NOTE: 59 y.o. male s/p inicision and drainage with irrigation and debridement RT foot on 08/09/2019. Patient states he is feeling much better today. He is eating dinner upon presentation.   Physical Exam: Foot evaluated today. There continues to be edema to the surgical foot with mild warmth. Negative for malodor. Moderate amount of serous drainage noted from incision/abscess site. Ulcer to the plantar 1st MPTJ appears superficial and stable.   Assessment and Plan of Care: - s/p incision and drainage w/ irrigation and debridement RT. DOS: 08/09/2019  Dressings changed. Patient may require serial debridement and washout in  the OR setting depending on wether the patient improves significantly over  the next 24-48 hours.  - continue IV vancomycin abx as per Hospitalist - most recent labs pending at 1700 today. Will follow.  - podiatry will follow     Edrick Kins, DPM [facility]  Dr. Edrick Kins, DPM    [address], [place] [address]                Office [phone]  Fax [phone]

## 2019-08-10 NOTE — Progress Notes (Signed)
Inpatient Diabetes Program Recommendations  AACE/ADA: New Consensus Statement on Inpatient Glycemic Control (2015)  Target Ranges:  Prepandial:   less than 140 mg/dL      Peak postprandial:   less than 180 mg/dL (1-2 hours)      Critically ill patients:  140 - 180 mg/dL   Lab Results  Component Value Date   GLUCAP 404 (H) 08/10/2019   HGBA1C 7.1 (H) 08/08/2019    Review of Glycemic Control Results for Jeffery [name], Jeffery [name] (MRN [id number]) as of 08/10/2019 12:06  Ref. Range 08/09/2019 23:42 08/10/2019 06:38 08/10/2019 11:11  Glucose-Capillary Latest Ref Range: 70 - 99 mg/dL 211 (H) 285 (H) 404 (H)    Inpatient Diabetes Program Recommendations:    Blood sugar's trending up after steroid last night.  Please consider increasing meal coverage to 12 units TID with meals if eats at least 50% of meal and CBG >80 mg/ml   Thank you, Reche Dixon, RN, BSN Diabetes Coordinator Inpatient Diabetes Program [phone] (team pager from 8a-5p)

## 2019-08-10 NOTE — Progress Notes (Signed)
PROGRESS NOTE    Jeffery [name]  IRC:[id number] DOB: 01-Jan-1961 DOA: 08/07/2019 PCP: [name], MD    Brief Narrative:  59 year old gentleman with history of type 2 diabetes on insulin, hypertension, hyperlipidemia, paroxysmal atrial fibrillation status post cardioversion currently on Eliquis presented to the emergency department with persistent right foot ulcer and pain and swelling.  In the emergency room, found to have significant swelling, suspected osteomyelitis and admitted with IV antibiotics.   Assessment & Plan:   Principal Problem:   Severe sepsis with acute organ dysfunction due to methicillin susceptible Staphylococcus aureus (MSSA) (HCC) Active Problems:   Essential hypertension   Uncontrolled type 2 diabetes mellitus with diabetic polyneuropathy, without long-term current use of insulin (HCC)   Paroxysmal atrial fibrillation (HCC)   Right foot ulcer, with fat layer exposed ([place])   Cellulitis of right lower extremity   Mixed diabetic hyperlipidemia associated with type 2 diabetes mellitus ([place])   AKI (acute kidney injury) ([place])   Sepsis due to methicillin susceptible Staphylococcus aureus (MSSA) with acute renal failure ([place])  Sepsis present on admission due to localized foot infection with acute renal failure: Clinically improving.  Remains on vancomycin.  Previous culture with MSSA.  Surgical cultures growing gram-positive cocci. Continue vancomycin. MRI demonstrated local soft tissue inflammation, no osteomyelitis. Underwent multiple surgical incisions, with soft tissue abscess. We will continue vancomycin today.  Depends upon final cultures, will be able to change to oral antibiotics by tomorrow.  Wound care plan as per podiatry surgery.  Acute kidney injury: Due to sepsis.  Hydrated with intravenous fluid.  Renal functions improving.  Holding ARB.  Continue to monitor.  Hypertension: Blood pressures fairly stable.  Continue beta-blockers.  Holding other  antihypertensives.  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  A1c 7.1.  Continue home insulin doses.  Fairly stable today.  Paroxysmal atrial fibrillation: Currently sinus rhythm on metoprolol and Eliquis that he will continue.   DVT prophylaxis: Eliquis Code Status: Full code Family Communication: None Disposition Plan: Status is: Inpatient  Remains inpatient appropriate because:IV treatments appropriate due to intensity of illness or inability to take PO   Dispo: The patient is from: Home              Anticipated d/c is to: Home              Anticipated d/c date is: Architectural technologist.              Patient currently is not medically stable to d/c.          Consultants:   Podiatry  Procedures:   None  Antimicrobials:  Anti-infectives (From admission, onward)   Start     Dose/Rate Route Frequency Ordered Stop   08/09/19 1000  vancomycin (VANCOREADY) IVPB 1500 mg/300 mL     1,500 mg 150 mL/hr over 120 Minutes Intravenous Every 24 hours 08/08/19 0909     08/08/19 0800  vancomycin (VANCOREADY) IVPB 2000 mg/400 mL  Status:  Discontinued     2,000 mg 200 mL/hr over 120 Minutes Intravenous Every 24 hours 08/08/19 0530 08/08/19 0909   08/07/19 2130  vancomycin (VANCOCIN) IVPB 1000 mg/200 mL premix     1,000 mg 200 mL/hr over 60 Minutes Intravenous  Once 08/07/19 2120 08/07/19 2312         Subjective: Seen and examined.  Felt better after surgery last night.  No other overnight events.  Wants to walk.  Afebrile.  Objective: Vitals:   08/09/19  2201 08/10/19 0111 08/10/19 0503 08/10/19 0909  BP: 134/76 123/71 (!) 133/91 137/72  Pulse: 74 (!) 59 (!) 52 62  Resp: 18 16 16    Temp: 98 F (36.7 C) 97.6 F (36.4 C) 98.4 F (36.9 C)   TempSrc: Oral Oral    SpO2: 95% 95% 97%   Weight: (!) 161.1 kg     Height:        Intake/Output Summary (Last 24 hours) at 08/10/2019 1025 Last data filed at 08/10/2019 0900 Gross per 24 hour    Intake 1711.97 ml  Output 2025 ml  Net -313.03 ml   Filed Weights   08/08/19 1433 08/09/19 1937 08/09/19 2201  Weight: (!) 161.1 kg (!) 161.1 kg (!) 161.1 kg    Examination:  General exam: Appears calm and comfortable, on room air. Respiratory system: Clear to auscultation. Respiratory effort normal. Cardiovascular system: S1 & S2 heard, RRR.  Gastrointestinal system: Abdomen is nondistended, soft and nontender.  Obese and pendulous Central nervous system: Alert and oriented. No focal neurological deficits. Extremities: Symmetric 5 x 5 power. Skin: Lower leg and ankle along with foot still with significant swelling  Surgical dressing, not removed by me.  Distal neurovascular status intact.         Data Reviewed: I have personally reviewed following labs and imaging studies  CBC: Recent Labs  Lab 08/07/19 1900 08/08/19 0354 08/09/19 1010 08/10/19 0517  WBC 21.8* 18.4* 19.1* 18.7*  NEUTROABS 18.9* 15.5* 16.1* 17.2*  HGB 12.8* 11.8* 12.3* 12.0*  HCT 40.1 37.9* 39.2 37.9*  MCV 93.0 94.3 92.2 91.5  PLT 346 283 290 174   Basic Metabolic Panel: Recent Labs  Lab 08/07/19 1900 08/08/19 0354 08/09/19 1010 08/10/19 0517  NA 132* 133* 133* 134*  K 4.2 3.9 4.1 5.3*  CL 96* 99 102 105  CO2 22 21* 19* 19*  GLUCOSE 302* 329* 234* 347*  BUN 19 28* 28* 27*  CREATININE 1.46* 2.31* 1.43* 1.38*  CALCIUM 8.9 8.4* 8.7* 8.9  MG  --  2.1 2.2  --   PHOS  --   --  3.4  --    GFR: Estimated Creatinine Clearance: 95.1 mL/min (A) (by C-G formula based on SCr of 1.38 mg/dL (H)). Liver Function Tests: Recent Labs  Lab 08/07/19 1900 08/08/19 0354  AST 28 27  ALT 33 31  ALKPHOS 82 79  BILITOT 1.2 1.0  PROT 8.0 7.1  ALBUMIN 2.9* 2.5*   No results for input(s): LIPASE, AMYLASE in the last 168 hours. No results for input(s): AMMONIA in the last 168 hours. Coagulation Profile: Recent Labs  Lab 08/07/19 1900  INR 1.7*   Cardiac Enzymes: No results for input(s): CKTOTAL,  CKMB, CKMBINDEX, TROPONINI in the last 168 hours. BNP (last 3 results) No results for input(s): PROBNP in the last 8760 hours. HbA1C: Recent Labs    08/08/19 1425  HGBA1C 7.1*   CBG: Recent Labs  Lab 08/09/19 1944 08/09/19 2113 08/09/19 2202 08/09/19 2342 08/10/19 0638  GLUCAP 147* 99 98 211* 285*   Lipid Profile: No results for input(s): CHOL, HDL, LDLCALC, TRIG, CHOLHDL, LDLDIRECT in the last 72 hours. Thyroid Function Tests: No results for input(s): TSH, T4TOTAL, FREET4, T3FREE, THYROIDAB in the last 72 hours. Anemia Panel: No results for input(s): VITAMINB12, FOLATE, FERRITIN, TIBC, IRON, RETICCTPCT in the last 72 hours. Sepsis Labs: Recent Labs  Lab 08/07/19 1900 08/07/19 2145  LATICACIDVEN 1.9 1.4    Recent Results (from the past 240 hour(s))  Culture, blood (  Routine x 2)     Status: None (Preliminary result)   Collection Time: 08/07/19  7:00 PM   Specimen: BLOOD  Result Value Ref Range Status   Specimen Description BLOOD LEFT ARM  Final   Special Requests   Final    BOTTLES DRAWN AEROBIC AND ANAEROBIC Blood Culture results may not be optimal due to an inadequate volume of blood received in culture bottles   Culture   Final    NO GROWTH 3 DAYS Performed at [facility], [place] [address] [place], [place] [address]    Report Status PENDING  Incomplete  Culture, blood (Routine x 2)     Status: None (Preliminary result)   Collection Time: 08/07/19  7:02 PM   Specimen: BLOOD  Result Value Ref Range Status   Specimen Description BLOOD RIGHT HAND  Final   Special Requests   Final    BOTTLES DRAWN AEROBIC AND ANAEROBIC Blood Culture results may not be optimal due to an inadequate volume of blood received in culture bottles   Culture   Final    NO GROWTH 3 DAYS Performed at [facility], [place] [address] [place], [place] [address]    Report Status PENDING  Incomplete  SARS Coronavirus 2 by RT PCR (hospital order, performed in [facility] hospital lab)  Nasopharyngeal Nasopharyngeal Swab     Status: None   Collection Time: 08/07/19  9:25 PM   Specimen: Nasopharyngeal Swab  Result Value Ref Range Status   SARS Coronavirus 2 NEGATIVE NEGATIVE Final    Comment: (NOTE) SARS-CoV-2 target nucleic acids are NOT DETECTED. The SARS-CoV-2 RNA is generally detectable in upper and lower respiratory specimens during the acute phase of infection. The lowest concentration of SARS-CoV-2 viral copies this assay can detect is 250 copies / mL. A negative result does not preclude SARS-CoV-2 infection and should not be used as the sole basis for treatment or other patient management decisions.  A negative result may occur with improper specimen collection / handling, submission of specimen other than nasopharyngeal swab, presence of viral mutation(s) within the areas targeted by this assay, and inadequate number of viral copies (<250 copies / mL). A negative result must be combined with clinical observations, patient history, and epidemiological information. Fact Sheet for Patients:   StrictlyIdeas.no Fact Sheet for Healthcare Providers: BankingDealers.co.za This test is not yet approved or cleared  by the [place] FDA and has been authorized for detection and/or diagnosis of SARS-CoV-2 by FDA under an Emergency Use Authorization (EUA).  This EUA will remain in effect (meaning this test can be used) for the duration of the COVID-19 declaration under Section 564(b)(1) of the Act, 21 U.S.C. section 360bbb-3(b)(1), unless the authorization is terminated or revoked sooner. Performed at [facility], [place] [address], [place], [place] [address]   Urine culture     Status: None (Preliminary result)   Collection Time: 08/08/19  5:36 AM   Specimen: In/Out Cath Urine  Result Value Ref Range Status   Specimen Description IN/OUT CATH URINE  Final   Special Requests NONE  Final   Culture   Final     CULTURE REINCUBATED FOR BETTER GROWTH Performed at [facility], [place] [address] [place], [place] [address]    Report Status PENDING  Incomplete  Aerobic/Anaerobic Culture (surgical/deep wound)     Status: None (Preliminary result)   Collection Time: 08/09/19  8:43 PM   Specimen: PATH Other; Tissue  Result Value Ref  Range Status   Specimen Description FOOT RIGHT  Final   Special Requests NONE  Final   Gram Stain   Final    MODERATE WBC PRESENT,BOTH PMN AND MONONUCLEAR MODERATE GRAM POSITIVE COCCI IN PAIRS Performed at [facility], [address] [place], [place] [address]    Culture PENDING  Incomplete   Report Status PENDING  Incomplete         Radiology Studies: MR FOOT RIGHT WO CONTRAST  Result Date: 08/08/2019 CLINICAL DATA:  Ulcer at the bottom of the right foot. Ulcers located at the base of the great toe. Ongoing pain which is worsening. EXAM: MRI OF THE RIGHT FOREFOOT WITHOUT CONTRAST TECHNIQUE: Multiplanar, multisequence MR imaging of the right forefoot was performed. No intravenous contrast was administered. COMPARISON:  None. FINDINGS: Bones/Joint/Cartilage Soft tissue ulcer at the plantar base of the first MTP joint. No focal marrow signal abnormality to suggest osteomyelitis. No cortical destruction or osteolysis. No periosteal reaction. No fracture or dislocation. Normal alignment. No joint effusion. Severe osteoarthritis of the third, fourth and fifth tarsometatarsal joints with subchondral reactive marrow edema. Mild-moderate osteoarthritis of the first and second tarsometatarsal joints with subchondral marrow edema. Severe fragmentation of the navicular with advanced arthropathy of the talonavicular joint and naviculocuneiform joint. Mild-moderate osteoarthritis of the calcaneocuboid joint. Ligaments Collateral ligaments are intact. Lisfranc ligament is intact. Muscles and Tendons Flexor, peroneal and extensor compartment tendons are intact. Generalized muscle  atrophy. Soft tissue 2.2 x 4 x 4.2 cm multiloculated fluid collection along the dorsal lateral aspect of the foot at the level of the fourth and fifth TMT joints concerning for a large ganglion cyst. Nonspecific soft tissue edema of the dorsal aspect of the forefoot. No soft tissue mass. No hematoma. IMPRESSION: 1. Soft tissue ulcer at the plantar base of the first MTP joint. No evidence of osteomyelitis. 2. 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 3. A 2.2 x 4 x 4.2 cm multiloculated fluid collection along the dorsal lateral aspect of the foot at the level of the fourth and fifth TMT joints concerning for a large ganglion cyst. Electronically Signed   By: Kathreen Devoid   On: 08/08/2019 11:01        Scheduled Meds:  acetaminophen  1,000 mg Oral Once   amiodarone  200 mg Oral BID   apixaban  5 mg Oral BID   atorvastatin  20 mg Oral Daily   insulin aspart  0-20 Units Subcutaneous TID AC & HS   insulin aspart  8 Units Subcutaneous TID WC   insulin glargine  51 Units Subcutaneous BID   metoprolol tartrate  100 mg Oral BID   Continuous Infusions:  vancomycin 1,500 mg (08/09/19 1012)     LOS: 2 days    Time spent: 25 minutes    Barb Merino, MD [facility] Pager [phone]

## 2019-08-11 LAB — GLUCOSE, CAPILLARY
Glucose-Capillary: 113 mg/dL — ABNORMAL HIGH (ref 70–99)
Glucose-Capillary: 210 mg/dL — ABNORMAL HIGH (ref 70–99)
Glucose-Capillary: 171 mg/dL — ABNORMAL HIGH (ref 70–99)
Glucose-Capillary: 167 mg/dL — ABNORMAL HIGH (ref 70–99)

## 2019-08-11 LAB — CBC WITH DIFFERENTIAL/PLATELET
Abs Immature Granulocytes: 0.19 10*3/uL — ABNORMAL HIGH (ref 0.00–0.07)
Basophils Absolute: 0 10*3/uL (ref 0.0–0.1)
Basophils Relative: 0 %
Eosinophils Absolute: 0 10*3/uL (ref 0.0–0.5)
Eosinophils Relative: 0 %
HCT: 36.7 % — ABNORMAL LOW (ref 39.0–52.0)
Hemoglobin: 11.4 g/dL — ABNORMAL LOW (ref 13.0–17.0)
Immature Granulocytes: 1 %
Lymphocytes Relative: 6 %
Lymphs Abs: 1.4 10*3/uL (ref 0.7–4.0)
MCH: 29 pg (ref 26.0–34.0)
MCHC: 31.1 g/dL (ref 30.0–36.0)
MCV: 93.4 fL (ref 80.0–100.0)
Monocytes Absolute: 1 10*3/uL (ref 0.1–1.0)
Monocytes Relative: 5 %
Neutro Abs: 19 10*3/uL — ABNORMAL HIGH (ref 1.7–7.7)
Neutrophils Relative %: 88 %
Platelets: 380 10*3/uL (ref 150–400)
RBC: 3.93 MIL/uL — ABNORMAL LOW (ref 4.22–5.81)
RDW: 14.5 % (ref 11.5–15.5)
WBC: 21.5 10*3/uL — ABNORMAL HIGH (ref 4.0–10.5)
nRBC: 0 % (ref 0.0–0.2)

## 2019-08-11 LAB — BASIC METABOLIC PANEL
Anion gap: 9 (ref 5–15)
BUN: 26 mg/dL — ABNORMAL HIGH (ref 6–20)
CO2: 26 mmol/L (ref 22–32)
Calcium: 9.1 mg/dL (ref 8.9–10.3)
Chloride: 104 mmol/L (ref 98–111)
Creatinine, Ser: 1.1 mg/dL (ref 0.61–1.24)
GFR calc Af Amer: >60 mL/min (ref >60)
GFR calc non Af Amer: >60 mL/min (ref >60)
Glucose, Bld: 128 mg/dL — ABNORMAL HIGH (ref 70–99)
Potassium: 4.9 mmol/L (ref 3.5–5.1)
Sodium: 139 mmol/L (ref 135–145)

## 2019-08-11 NOTE — Progress Notes (Signed)
Subjective: POD #2 s/p right foot incision and drainage, washout.  He states that overall he is doing much better he is able to put weight on his foot.  He states that every day he is continuing to feel better.  While he was up he did hit his left second toe make the toenail bleed. Denies any systemic complaints such as fevers, chills, nausea, vomiting. No acute changes since last appointment, and no other complaints at this time.   Objective: AAO x3, NAD Incision and drainage site to the dorsal lateral aspect of right foot with some tissue necrosis around the area.  Upon evaluation there was no purulence identified and only serosanguineous drainage.  There is substantial improvement of edema and erythema to the right foot and leg.  There is no pain with ankle range of motion.  There is no tenderness of the ankle or leg.  Unable to identify any areas of fluctuation or crepitation.  There is no malodor.  Old blister present at the distal aspect of the right hallux.  Stable wound submetatarsal 1 without injuries or pus.  Is no fluctuance or crepitation is very. LEFT second digit with some dried blood at the distal aspect.  There is no edema, erythema.  No laceration. No pain with calf compression, warmth, erythema         Assessment: Cellulitis/abscess right lower extremity with improvement  Plan: Patient is afebrile however white blood cell count is still elevated at 21.5.  Kidney function improved.  I would have anticipated that the white blood cell count would improve given his clinical response.  I am not able to identify any fluctuation there is no purulence today.  I initially made n.p.o. when I saw the blood work this morning however since the foot is looking better we will discontinue n.p.o.  Continue IV antibiotics and await clinical response.  Possible return to the operating tomorrow for debridement, washout and possible ACell if clinically improved.  Wound culture from Wednesday  gram-positive cocci.  Continue vancomycin for now. Will continue to monitor closely  Celesta Gentile, DPM O: [phone] C: [phone]

## 2019-08-11 NOTE — Progress Notes (Signed)
PROGRESS NOTE    Jeffery [name]  PYK:[id number] DOB: November 15, 1960 DOA: 08/07/2019 PCP: [name], MD    Brief Narrative:  59 year old gentleman with history of type 2 diabetes on insulin, hypertension, hyperlipidemia, paroxysmal atrial fibrillation status post cardioversion currently on Eliquis presented to the emergency department with persistent right foot ulcer and pain and swelling.  In the emergency room, found to have significant swelling, suspected osteomyelitis and admitted with IV antibiotics.   Assessment & Plan:   Principal Problem:   Severe sepsis with acute organ dysfunction due to methicillin susceptible Staphylococcus aureus (MSSA) (HCC) Active Problems:   Essential hypertension   Uncontrolled type 2 diabetes mellitus with diabetic polyneuropathy, without long-term current use of insulin (HCC)   Paroxysmal atrial fibrillation (HCC)   Right foot ulcer, with fat layer exposed ([place])   Cellulitis of right lower extremity   Mixed diabetic hyperlipidemia associated with type 2 diabetes mellitus ([place])   AKI (acute kidney injury) ([place])   Sepsis due to methicillin susceptible Staphylococcus aureus (MSSA) with acute renal failure ([place])  Sepsis present on admission due to localized foot infection with acute renal failure: Clinically improving after surgical incision and drainage.  WBC still elevated. Previous culture with MSSA.  Surgical cultures growing gram-positive cocci.  Probably MSSA. Continue vancomycin due to significant symptoms. We will continue vancomycin today.  Followed by podiatry surgery, discussing reexploration if persistent elevated WBC count.  Acute kidney injury: Due to sepsis.  Hydrated with intravenous fluid.  Renal functions improving.  Holding ARB.  Continue to monitor.  Hypertension: Blood pressures fairly stable.  Continue beta-blockers.  Holding other antihypertensives.  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  A1c 7.1.  Continue home insulin doses.  Blood sugars stabilizing after increasing doses of prandial insulin.  Paroxysmal atrial fibrillation: Currently sinus rhythm on metoprolol and Eliquis that he will continue.   DVT prophylaxis: Eliquis Code Status: Full code Family Communication: None Disposition Plan: Status is: Inpatient  Remains inpatient appropriate because:IV treatments appropriate due to intensity of illness or inability to take PO   Dispo: The patient is from: Home              Anticipated d/c is to: Home              Anticipated d/c date is: 2 days.              Patient currently is not medically stable to d/c.  Still has significantly elevated WBC count, swelling of the leg.  Patient may need another debridement.   Consultants:   Podiatry  Procedures:   None  Antimicrobials:  Anti-infectives (From admission, onward)   Start     Dose/Rate Route Frequency Ordered Stop   08/10/19 2200  vancomycin (VANCOREADY) IVPB 1250 mg/250 mL     1,250 mg 166.7 mL/hr over 90 Minutes Intravenous Every 12 hours 08/10/19 1541     08/09/19 1000  vancomycin (VANCOREADY) IVPB 1500 mg/300 mL  Status:  Discontinued     1,500 mg 150 mL/hr over 120 Minutes Intravenous Every 24 hours 08/08/19 0909 08/10/19 1541   08/08/19 0800  vancomycin (VANCOREADY) IVPB 2000 mg/400 mL  Status:  Discontinued     2,000 mg 200 mL/hr over 120 Minutes Intravenous Every 24 hours 08/08/19 0530 08/08/19 0909   08/07/19 2130  vancomycin (VANCOCIN) IVPB 1000 mg/200 mL premix     1,000 mg 200 mL/hr over 60 Minutes Intravenous  Once 08/07/19 2120 08/07/19 2312  Subjective: Seen and examined.  Afebrile.  Overall his leg pain is better.  He is able to walk on it.  Objective: Vitals:   08/10/19 1653 08/10/19 2045 08/11/19 0429 08/11/19 0858  BP: 132/63 116/60 131/88 126/87  Pulse: (!) 59 (!) 55 (!) 51 (!) 58  Resp: 18 18 16 18   Temp: 98.2 F (36.8 C) (!) 97.5 F (36.4 C) (!) 97.4  F (36.3 C) 97.6 F (36.4 C)  TempSrc: Oral Oral Oral Oral  SpO2: 99% 96% 100% 96%  Weight:  (!) 162.2 kg    Height:        Intake/Output Summary (Last 24 hours) at 08/11/2019 1202 Last data filed at 08/11/2019 0900 Gross per 24 hour  Intake 1092.21 ml  Output 1575 ml  Net -482.79 ml   Filed Weights   08/09/19 1937 08/09/19 2201 08/10/19 2045  Weight: (!) 161.1 kg (!) 161.1 kg (!) 162.2 kg    Examination:  General exam: Appears calm and comfortable, on room air. Respiratory system: Clear to auscultation. Respiratory effort normal. Cardiovascular system: S1 & S2 heard, RRR.  Gastrointestinal system: Abdomen is nondistended, soft and nontender.  Obese and pendulous  Central nervous system: Alert and oriented. No focal neurological deficits. Extremities: Symmetric 5 x 5 power. Skin: Right lower leg and ankle swelling.  3+ edema.  Tenderness present. Surgical wounds on the dorsal and plantar aspect, examined and dressing applied by surgery.  Pictures available in the chart.   Data Reviewed: I have personally reviewed following labs and imaging studies  CBC: Recent Labs  Lab 08/07/19 1900 08/08/19 0354 08/09/19 1010 08/10/19 0517 08/11/19 0538  WBC 21.8* 18.4* 19.1* 18.7* 21.5*  NEUTROABS 18.9* 15.5* 16.1* 17.2* 19.0*  HGB 12.8* 11.8* 12.3* 12.0* 11.4*  HCT 40.1 37.9* 39.2 37.9* 36.7*  MCV 93.0 94.3 92.2 91.5 93.4  PLT 346 283 290 330 619   Basic Metabolic Panel: Recent Labs  Lab 08/07/19 1900 08/08/19 0354 08/09/19 1010 08/10/19 0517 08/11/19 0538  NA 132* 133* 133* 134* 139  K 4.2 3.9 4.1 5.3* 4.9  CL 96* 99 102 105 104  CO2 22 21* 19* 19* 26  GLUCOSE 302* 329* 234* 347* 128*  BUN 19 28* 28* 27* 26*  CREATININE 1.46* 2.31* 1.43* 1.38* 1.10  CALCIUM 8.9 8.4* 8.7* 8.9 9.1  MG  --  2.1 2.2  --   --   PHOS  --   --  3.4  --   --    GFR: Estimated Creatinine Clearance: 119.7 mL/min (by C-G formula based on SCr of 1.1 mg/dL). Liver Function Tests: Recent  Labs  Lab 08/07/19 1900 08/08/19 0354  AST 28 27  ALT 33 31  ALKPHOS 82 79  BILITOT 1.2 1.0  PROT 8.0 7.1  ALBUMIN 2.9* 2.5*   No results for input(s): LIPASE, AMYLASE in the last 168 hours. No results for input(s): AMMONIA in the last 168 hours. Coagulation Profile: Recent Labs  Lab 08/07/19 1900  INR 1.7*   Cardiac Enzymes: No results for input(s): CKTOTAL, CKMB, CKMBINDEX, TROPONINI in the last 168 hours. BNP (last 3 results) No results for input(s): PROBNP in the last 8760 hours. HbA1C: Recent Labs    08/08/19 1425  HGBA1C 7.1*   CBG: Recent Labs  Lab 08/10/19 1111 08/10/19 1653 08/10/19 2046 08/11/19 0631 08/11/19 1119  GLUCAP 404* 282* 226* 113* 210*   Lipid Profile: No results for input(s): CHOL, HDL, LDLCALC, TRIG, CHOLHDL, LDLDIRECT in the last 72 hours. Thyroid Function Tests:  No results for input(s): TSH, T4TOTAL, FREET4, T3FREE, THYROIDAB in the last 72 hours. Anemia Panel: No results for input(s): VITAMINB12, FOLATE, FERRITIN, TIBC, IRON, RETICCTPCT in the last 72 hours. Sepsis Labs: Recent Labs  Lab 08/07/19 1900 08/07/19 2145  LATICACIDVEN 1.9 1.4    Recent Results (from the past 240 hour(s))  Culture, blood (Routine x 2)     Status: None (Preliminary result)   Collection Time: 08/07/19  7:00 PM   Specimen: BLOOD  Result Value Ref Range Status   Specimen Description BLOOD LEFT ARM  Final   Special Requests   Final    BOTTLES DRAWN AEROBIC AND ANAEROBIC Blood Culture results may not be optimal due to an inadequate volume of blood received in culture bottles   Culture   Final    NO GROWTH 3 DAYS Performed at [facility], [place] [address] [place], [place] [address]    Report Status PENDING  Incomplete  Culture, blood (Routine x 2)     Status: None (Preliminary result)   Collection Time: 08/07/19  7:02 PM   Specimen: BLOOD  Result Value Ref Range Status   Specimen Description BLOOD RIGHT HAND  Final   Special Requests   Final     BOTTLES DRAWN AEROBIC AND ANAEROBIC Blood Culture results may not be optimal due to an inadequate volume of blood received in culture bottles   Culture   Final    NO GROWTH 3 DAYS Performed at [facility], [place] [address], [place], [place] [address]    Report Status PENDING  Incomplete  SARS Coronavirus 2 by RT PCR (hospital order, performed in [facility] hospital lab) Nasopharyngeal Nasopharyngeal Swab     Status: None   Collection Time: 08/07/19  9:25 PM   Specimen: Nasopharyngeal Swab  Result Value Ref Range Status   SARS Coronavirus 2 NEGATIVE NEGATIVE Final    Comment: (NOTE) SARS-CoV-2 target nucleic acids are NOT DETECTED. The SARS-CoV-2 RNA is generally detectable in upper and lower respiratory specimens during the acute phase of infection. The lowest concentration of SARS-CoV-2 viral copies this assay can detect is 250 copies / mL. A negative result does not preclude SARS-CoV-2 infection and should not be used as the sole basis for treatment or other patient management decisions.  A negative result may occur with improper specimen collection / handling, submission of specimen other than nasopharyngeal swab, presence of viral mutation(s) within the areas targeted by this assay, and inadequate number of viral copies (<250 copies / mL). A negative result must be combined with clinical observations, patient history, and epidemiological information. Fact Sheet for Patients:   StrictlyIdeas.no Fact Sheet for Healthcare Providers: BankingDealers.co.za This test is not yet approved or cleared  by the [place] FDA and has been authorized for detection and/or diagnosis of SARS-CoV-2 by FDA under an Emergency Use Authorization (EUA).  This EUA will remain in effect (meaning this test can be used) for the duration of the COVID-19 declaration under Section 564(b)(1) of the Act, 21 U.S.C. section 360bbb-3(b)(1), unless the  authorization is terminated or revoked sooner. Performed at [facility], [place] [address], [place], [place] [address]   Urine culture     Status: Abnormal   Collection Time: 08/08/19  5:36 AM   Specimen: In/Out Cath Urine  Result Value Ref Range Status   Specimen Description IN/OUT CATH URINE  Final   Special Requests   Final    NONE Performed at [facility],  [address], [place], [place] [address]    Culture 100 COLONIES/mL ENTEROBACTER AEROGENES (A)  Final   Report Status 08/10/2019 FINAL  Final   Organism ID, Bacteria ENTEROBACTER AEROGENES (A)  Final      Susceptibility   Enterobacter aerogenes - MIC*    CEFAZOLIN >=64 RESISTANT Resistant     CEFTRIAXONE <=1 SENSITIVE Sensitive     CIPROFLOXACIN <=0.25 SENSITIVE Sensitive     GENTAMICIN <=1 SENSITIVE Sensitive     IMIPENEM 2 SENSITIVE Sensitive     NITROFURANTOIN 64 INTERMEDIATE Intermediate     TRIMETH/SULFA <=20 SENSITIVE Sensitive     PIP/TAZO 8 SENSITIVE Sensitive     * 100 COLONIES/mL ENTEROBACTER AEROGENES  Aerobic/Anaerobic Culture (surgical/deep wound)     Status: None (Preliminary result)   Collection Time: 08/09/19  8:43 PM   Specimen: PATH Other; Tissue  Result Value Ref Range Status   Specimen Description FOOT RIGHT  Final   Special Requests NONE  Final   Gram Stain   Final    MODERATE WBC PRESENT,BOTH PMN AND MONONUCLEAR MODERATE GRAM POSITIVE COCCI IN PAIRS Performed at [facility], [address] [place], [place] [address]    Culture ABUNDANT STAPHYLOCOCCUS AUREUS  Final   Report Status PENDING  Incomplete         Radiology Studies: No results found.      Scheduled Meds: . acetaminophen  1,000 mg Oral Once  . amiodarone  200 mg Oral BID  . apixaban  5 mg Oral BID  . atorvastatin  20 mg Oral Daily  . insulin aspart  0-20 Units Subcutaneous TID AC & HS  . insulin aspart  12 Units Subcutaneous TID WC  . insulin glargine  51 Units Subcutaneous BID  . metoprolol  tartrate  100 mg Oral BID   Continuous Infusions: . sodium chloride Stopped (08/10/19 2357)  . vancomycin 1,250 mg (08/11/19 1158)     LOS: 3 days    Time spent: 25 minutes    Barb Merino, MD [facility] Pager [phone]

## 2019-08-11 NOTE — Plan of Care (Signed)
  Problem: Activity: Goal: Risk for activity intolerance will decrease Outcome: Progressing   

## 2019-08-11 NOTE — Progress Notes (Signed)
Inpatient Diabetes Program Recommendations  AACE/ADA: New Consensus Statement on Inpatient Glycemic Control (2015)  Target Ranges:  Prepandial:   less than 140 mg/dL      Peak postprandial:   less than 180 mg/dL (1-2 hours)      Critically ill patients:  140 - 180 mg/dL   Lab Results  Component Value Date   GLUCAP 210 (H) 08/11/2019   HGBA1C 7.1 (H) 08/08/2019    Review of Glycemic Control Results for Jeffery [name], Jeffery [name] (MRN [id number]) as of 08/11/2019 14:37  Ref. Range 08/10/2019 16:53 08/10/2019 20:46 08/11/2019 06:31 08/11/2019 11:19  Glucose-Capillary Latest Ref Range: 70 - 99 mg/dL 282 (H) 226 (H) 113 (H) 210 (H)     Inpatient Diabetes Program Recommendations:     Novolog 14 units TID with meals if eats at least 50% of meal and CBG is > 80 mg/dl  Thank you, Reche Dixon, RN, BSN Diabetes Coordinator Inpatient Diabetes Program [phone] (team pager from 8a-5p)

## 2019-08-12 LAB — CULTURE, BLOOD (ROUTINE X 2)
Culture: NO GROWTH
Culture: NO GROWTH

## 2019-08-12 LAB — CBC WITH DIFFERENTIAL/PLATELET
Abs Immature Granulocytes: 0.14 10*3/uL — ABNORMAL HIGH (ref 0.00–0.07)
Basophils Absolute: 0 10*3/uL (ref 0.0–0.1)
Basophils Relative: 0 %
Eosinophils Absolute: 0.1 10*3/uL (ref 0.0–0.5)
Eosinophils Relative: 1 %
HCT: 36.2 % — ABNORMAL LOW (ref 39.0–52.0)
Hemoglobin: 11.3 g/dL — ABNORMAL LOW (ref 13.0–17.0)
Immature Granulocytes: 1 %
Lymphocytes Relative: 17 %
Lymphs Abs: 2.2 10*3/uL (ref 0.7–4.0)
MCH: 29 pg (ref 26.0–34.0)
MCHC: 31.2 g/dL (ref 30.0–36.0)
MCV: 93.1 fL (ref 80.0–100.0)
Monocytes Absolute: 0.8 10*3/uL (ref 0.1–1.0)
Monocytes Relative: 6 %
Neutro Abs: 9.7 10*3/uL — ABNORMAL HIGH (ref 1.7–7.7)
Neutrophils Relative %: 75 %
Platelets: 375 10*3/uL (ref 150–400)
RBC: 3.89 MIL/uL — ABNORMAL LOW (ref 4.22–5.81)
RDW: 14.7 % (ref 11.5–15.5)
WBC: 12.9 10*3/uL — ABNORMAL HIGH (ref 4.0–10.5)
nRBC: 0 % (ref 0.0–0.2)

## 2019-08-12 LAB — BASIC METABOLIC PANEL
Anion gap: 6 (ref 5–15)
BUN: 33 mg/dL — ABNORMAL HIGH (ref 6–20)
CO2: 23 mmol/L (ref 22–32)
Calcium: 8.5 mg/dL — ABNORMAL LOW (ref 8.9–10.3)
Chloride: 108 mmol/L (ref 98–111)
Creatinine, Ser: 1.26 mg/dL — ABNORMAL HIGH (ref 0.61–1.24)
GFR calc Af Amer: >60 mL/min (ref >60)
GFR calc non Af Amer: >60 mL/min (ref >60)
Glucose, Bld: 201 mg/dL — ABNORMAL HIGH (ref 70–99)
Potassium: 4.5 mmol/L (ref 3.5–5.1)
Sodium: 137 mmol/L (ref 135–145)

## 2019-08-12 LAB — GLUCOSE, CAPILLARY
Glucose-Capillary: 118 mg/dL — ABNORMAL HIGH (ref 70–99)
Glucose-Capillary: 114 mg/dL — ABNORMAL HIGH (ref 70–99)
Glucose-Capillary: 196 mg/dL — ABNORMAL HIGH (ref 70–99)
Glucose-Capillary: 108 mg/dL — ABNORMAL HIGH (ref 70–99)

## 2019-08-12 MED ORDER — CEFAZOLIN SODIUM-DEXTROSE 2-4 GM/100ML-% IV SOLN
2.0000 g | Freq: Three times a day (TID) | INTRAVENOUS | Status: DC
  Administered 2019-08-12 – 2019-08-15 (×9): 2 g via INTRAVENOUS
  Filled 2019-08-12 (×13): qty 100

## 2019-08-12 MED ORDER — WHITE PETROLATUM EX OINT
TOPICAL_OINTMENT | CUTANEOUS | Status: AC
  Filled 2019-08-12: qty 28.35

## 2019-08-12 NOTE — Progress Notes (Signed)
PROGRESS NOTE    BIRL LOBELLO  DPO:[id number] DOB: [birth year] DOA: 08/07/2019 PCP: [name], MD    Brief Narrative:  59 year old gentleman with history of type 2 diabetes on insulin, hypertension, hyperlipidemia, paroxysmal atrial fibrillation status post cardioversion currently on Eliquis presented to the emergency department with persistent right foot ulcer and pain and swelling.  In the emergency room, found to have significant swelling, suspected osteomyelitis and admitted with IV antibiotics.   Assessment & Plan:   Principal Problem:   Severe sepsis with acute organ dysfunction due to methicillin susceptible Staphylococcus aureus (MSSA) (HCC) Active Problems:   Essential hypertension   Uncontrolled type 2 diabetes mellitus with diabetic polyneuropathy, without long-term current use of insulin (HCC)   Paroxysmal atrial fibrillation (HCC)   Right foot ulcer, with fat layer exposed ([place])   Cellulitis of right lower extremity   Mixed diabetic hyperlipidemia associated with type 2 diabetes mellitus ([place])   AKI (acute kidney injury) ([place])   Sepsis due to methicillin susceptible Staphylococcus aureus (MSSA) with acute renal failure ([place])  Sepsis present on admission due to localized foot infection with acute renal failure: Clinically improving after surgical incision and drainage.  WBC trending down. Treated with vancomycin.  Surgical cultures with MSSA.  Change to Ancef. Due to significant infection, continued antibiotics with reexploration and possible skin graft planned for 5/17 by podiatry.  Acute kidney injury: Due to sepsis.  Fluctuates but is stable. Holding ARB.  Continue to monitor.  Hypertension: Blood pressures fairly stable.  Continue beta-blockers.  Holding other antihypertensives.  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  A1c 7.1.  Continue home insulin doses.  Blood sugars stabilizing after increasing doses of  prandial insulin.  Paroxysmal atrial fibrillation: Currently sinus rhythm on metoprolol and Eliquis that he will continue.   DVT prophylaxis: Eliquis Code Status: Full code Family Communication: None Disposition Plan: Status is: Inpatient  Remains inpatient appropriate because:IV treatments appropriate due to intensity of illness or inability to take PO   Dispo: The patient is from: Home              Anticipated d/c is to: Home              Anticipated d/c date is: 2 days.              Patient currently is not medically stable to d/c.  Still has significantly elevated WBC count, swelling of the leg.  Patient will need IV antibiotics and another surgical procedure in the hospital.   Consultants:   Podiatry  Procedures:   None  Antimicrobials:  Anti-infectives (From admission, onward)   Start     Dose/Rate Route Frequency Ordered Stop   08/10/19 2200  vancomycin (VANCOREADY) IVPB 1250 mg/250 mL  Status:  Discontinued     1,250 mg 166.7 mL/hr over 90 Minutes Intravenous Every 12 hours 08/10/19 1541 08/12/19 1054   08/09/19 1000  vancomycin (VANCOREADY) IVPB 1500 mg/300 mL  Status:  Discontinued     1,500 mg 150 mL/hr over 120 Minutes Intravenous Every 24 hours 08/08/19 0909 08/10/19 1541   08/08/19 0800  vancomycin (VANCOREADY) IVPB 2000 mg/400 mL  Status:  Discontinued     2,000 mg 200 mL/hr over 120 Minutes Intravenous Every 24 hours 08/08/19 0530 08/08/19 0909   08/07/19 2130  vancomycin (VANCOCIN) IVPB 1000 mg/200 mL premix     1,000 mg 200 mL/hr over 60 Minutes Intravenous  Once 08/07/19 2120 08/07/19 2312  Subjective: Seen and examined.  No overnight events.  WBC trending down.  Still has leg swelling, however better than before.  Afebrile.  Objective: Vitals:   08/11/19 1620 08/11/19 2022 08/12/19 0423 08/12/19 0937  BP: 129/87 138/90 122/87 134/88  Pulse: (!) 50 62 (!) 50 (!) 55  Resp: 18 20 18 18   Temp: 97.6 F (36.4 C) 97.7 F (36.5 C) (!) 97.4  F (36.3 C) 97.9 F (36.6 C)  TempSrc: Oral Oral Oral Oral  SpO2: 99% 98% 98% 96%  Weight:   (!) 162.9 kg   Height:        Intake/Output Summary (Last 24 hours) at 08/12/2019 1057 Last data filed at 08/12/2019 0519 Gross per 24 hour  Intake 756.09 ml  Output 700 ml  Net 56.09 ml   Filed Weights   08/09/19 2201 08/10/19 2045 08/12/19 0423  Weight: (!) 161.1 kg (!) 162.2 kg (!) 162.9 kg    Examination:  General exam: Appears calm and comfortable, on room air. Respiratory system: Clear to auscultation. Respiratory effort normal. Cardiovascular system: S1 & S2 heard, RRR.  Gastrointestinal system: Abdomen is nondistended, soft and nontender.  Obese and pendulous  Central nervous system: Alert and oriented. No focal neurological deficits. Extremities: Symmetric 5 x 5 power. Skin: Right lower leg and ankle swelling.  Swelling and tenderness receding from the leg. Surgical wounds on the dorsal and plantar aspect, examined and dressing applied by surgery.  Pictures available in the chart.   Data Reviewed: I have personally reviewed following labs and imaging studies  CBC: Recent Labs  Lab 08/08/19 0354 08/09/19 1010 08/10/19 0517 08/11/19 0538 08/12/19 0416  WBC 18.4* 19.1* 18.7* 21.5* 12.9*  NEUTROABS 15.5* 16.1* 17.2* 19.0* 9.7*  HGB 11.8* 12.3* 12.0* 11.4* 11.3*  HCT 37.9* 39.2 37.9* 36.7* 36.2*  MCV 94.3 92.2 91.5 93.4 93.1  PLT 283 290 330 380 950   Basic Metabolic Panel: Recent Labs  Lab 08/08/19 0354 08/09/19 1010 08/10/19 0517 08/11/19 0538 08/12/19 0416  NA 133* 133* 134* 139 137  K 3.9 4.1 5.3* 4.9 4.5  CL 99 102 105 104 108  CO2 21* 19* 19* 26 23  GLUCOSE 329* 234* 347* 128* 201*  BUN 28* 28* 27* 26* 33*  CREATININE 2.31* 1.43* 1.38* 1.10 1.26*  CALCIUM 8.4* 8.7* 8.9 9.1 8.5*  MG 2.1 2.2  --   --   --   PHOS  --  3.4  --   --   --    GFR: Estimated Creatinine Clearance: 104.8 mL/min (A) (by C-G formula based on SCr of 1.26 mg/dL (H)). Liver  Function Tests: Recent Labs  Lab 08/07/19 1900 08/08/19 0354  AST 28 27  ALT 33 31  ALKPHOS 82 79  BILITOT 1.2 1.0  PROT 8.0 7.1  ALBUMIN 2.9* 2.5*   No results for input(s): LIPASE, AMYLASE in the last 168 hours. No results for input(s): AMMONIA in the last 168 hours. Coagulation Profile: Recent Labs  Lab 08/07/19 1900  INR 1.7*   Cardiac Enzymes: No results for input(s): CKTOTAL, CKMB, CKMBINDEX, TROPONINI in the last 168 hours. BNP (last 3 results) No results for input(s): PROBNP in the last 8760 hours. HbA1C: No results for input(s): HGBA1C in the last 72 hours. CBG: Recent Labs  Lab 08/11/19 0631 08/11/19 1119 08/11/19 1617 08/11/19 2201 08/12/19 0634  GLUCAP 113* 210* 171* 167* 118*   Lipid Profile: No results for input(s): CHOL, HDL, LDLCALC, TRIG, CHOLHDL, LDLDIRECT in the last 72 hours. Thyroid  Function Tests: No results for input(s): TSH, T4TOTAL, FREET4, T3FREE, THYROIDAB in the last 72 hours. Anemia Panel: No results for input(s): VITAMINB12, FOLATE, FERRITIN, TIBC, IRON, RETICCTPCT in the last 72 hours. Sepsis Labs: Recent Labs  Lab 08/07/19 1900 08/07/19 2145  LATICACIDVEN 1.9 1.4    Recent Results (from the past 240 hour(s))  Culture, blood (Routine x 2)     Status: None   Collection Time: 08/07/19  7:00 PM   Specimen: BLOOD  Result Value Ref Range Status   Specimen Description BLOOD LEFT ARM  Final   Special Requests   Final    BOTTLES DRAWN AEROBIC AND ANAEROBIC Blood Culture results may not be optimal due to an inadequate volume of blood received in culture bottles   Culture   Final    NO GROWTH 5 DAYS Performed at [facility], [place] [address], [place], [place] [address]    Report Status 08/12/2019 FINAL  Final  Culture, blood (Routine x 2)     Status: None   Collection Time: 08/07/19  7:02 PM   Specimen: BLOOD  Result Value Ref Range Status   Specimen Description BLOOD RIGHT HAND  Final   Special Requests   Final    BOTTLES  DRAWN AEROBIC AND ANAEROBIC Blood Culture results may not be optimal due to an inadequate volume of blood received in culture bottles   Culture   Final    NO GROWTH 5 DAYS Performed at [facility], [place] [address] [place], [place] [address]    Report Status 08/12/2019 FINAL  Final  SARS Coronavirus 2 by RT PCR (hospital order, performed in [facility] hospital lab) Nasopharyngeal Nasopharyngeal Swab     Status: None   Collection Time: 08/07/19  9:25 PM   Specimen: Nasopharyngeal Swab  Result Value Ref Range Status   SARS Coronavirus 2 NEGATIVE NEGATIVE Final    Comment: (NOTE) SARS-CoV-2 target nucleic acids are NOT DETECTED. The SARS-CoV-2 RNA is generally detectable in upper and lower respiratory specimens during the acute phase of infection. The lowest concentration of SARS-CoV-2 viral copies this assay can detect is 250 copies / mL. A negative result does not preclude SARS-CoV-2 infection and should not be used as the sole basis for treatment or other patient management decisions.  A negative result may occur with improper specimen collection / handling, submission of specimen other than nasopharyngeal swab, presence of viral mutation(s) within the areas targeted by this assay, and inadequate number of viral copies (<250 copies / mL). A negative result must be combined with clinical observations, patient history, and epidemiological information. Fact Sheet for Patients:   StrictlyIdeas.no Fact Sheet for Healthcare Providers: BankingDealers.co.za This test is not yet approved or cleared  by the [place] FDA and has been authorized for detection and/or diagnosis of SARS-CoV-2 by FDA under an Emergency Use Authorization (EUA).  This EUA will remain in effect (meaning this test can be used) for the duration of the COVID-19 declaration under Section 564(b)(1) of the Act, 21 U.S.C. section 360bbb-3(b)(1), unless the  authorization is terminated or revoked sooner. Performed at [facility], [place] [address], [place], [place] [address]   Urine culture     Status: Abnormal   Collection Time: 08/08/19  5:36 AM   Specimen: In/Out Cath Urine  Result Value Ref Range Status   Specimen Description IN/OUT CATH URINE  Final   Special Requests   Final    NONE Performed at [facility],  [address] [place], [place] [address]    Culture 100 COLONIES/mL ENTEROBACTER AEROGENES (A)  Final   Report Status 08/10/2019 FINAL  Final   Organism ID, Bacteria ENTEROBACTER AEROGENES (A)  Final      Susceptibility   Enterobacter aerogenes - MIC*    CEFAZOLIN >=64 RESISTANT Resistant     CEFTRIAXONE <=1 SENSITIVE Sensitive     CIPROFLOXACIN <=0.25 SENSITIVE Sensitive     GENTAMICIN <=1 SENSITIVE Sensitive     IMIPENEM 2 SENSITIVE Sensitive     NITROFURANTOIN 64 INTERMEDIATE Intermediate     TRIMETH/SULFA <=20 SENSITIVE Sensitive     PIP/TAZO 8 SENSITIVE Sensitive     * 100 COLONIES/mL ENTEROBACTER AEROGENES  Aerobic/Anaerobic Culture (surgical/deep wound)     Status: None (Preliminary result)   Collection Time: 08/09/19  8:43 PM   Specimen: PATH Other; Tissue  Result Value Ref Range Status   Specimen Description FOOT RIGHT  Final   Special Requests NONE  Final   Gram Stain   Final    MODERATE WBC PRESENT,BOTH PMN AND MONONUCLEAR MODERATE GRAM POSITIVE COCCI IN PAIRS    Culture ABUNDANT STAPHYLOCOCCUS AUREUS  Final   Report Status PENDING  Incomplete   Organism ID, Bacteria STAPHYLOCOCCUS AUREUS  Final      Susceptibility   Staphylococcus aureus - MIC*    CIPROFLOXACIN <=0.5 SENSITIVE Sensitive     ERYTHROMYCIN <=0.25 SENSITIVE Sensitive     GENTAMICIN <=0.5 SENSITIVE Sensitive     OXACILLIN <=0.25 SENSITIVE Sensitive     TETRACYCLINE <=1 SENSITIVE Sensitive     VANCOMYCIN <=0.5 SENSITIVE Sensitive     TRIMETH/SULFA <=10 SENSITIVE Sensitive     CLINDAMYCIN <=0.25 SENSITIVE Sensitive      RIFAMPIN <=0.5 SENSITIVE Sensitive     Inducible Clindamycin Value in next row Sensitive      NEGATIVEPerformed at [place] [address], [place], [place] [address]    * ABUNDANT STAPHYLOCOCCUS AUREUS         Radiology Studies: No results found.      Scheduled Meds: . acetaminophen  1,000 mg Oral Once  . amiodarone  200 mg Oral BID  . apixaban  5 mg Oral BID  . atorvastatin  20 mg Oral Daily  . insulin aspart  0-20 Units Subcutaneous TID AC & HS  . insulin aspart  12 Units Subcutaneous TID WC  . insulin glargine  51 Units Subcutaneous BID  . metoprolol tartrate  100 mg Oral BID   Continuous Infusions: . sodium chloride Stopped (08/10/19 2357)     LOS: 4 days    Time spent: 25 minutes    Barb Merino, MD [facility] Pager [phone]

## 2019-08-12 NOTE — Progress Notes (Signed)
Pharmacy Antibiotic Note  Jeffery [name] is a 59 y.o. male admitted on 08/07/2019 with cellulitis.  Pharmacy consulted on 5/11 for vancomycin dosing.   Foot culture has grown out MSSA. Vanc will be transitioned to IV cefazolin today. Pt will go back to the OR on Monday for further I&D. Overall, the infection is improving per MD. WBC down to 12.9 today.   Scr 1.26  Plan: Dc vanc Cefazolin 2g IV q8  Height: 6' 2.02" (188 cm) Weight: (!) 162.9 kg (359 lb 2.1 oz) IBW/kg (Calculated) : 82.24  Temp (24hrs), Avg:97.7 F (36.5 C), Min:97.4 F (36.3 C), Max:97.9 F (36.6 C)  Recent Labs  Lab 08/07/19 1900 08/07/19 1900 08/07/19 2145 08/08/19 0354 08/09/19 1010 08/10/19 0517 08/11/19 0538 08/12/19 0416  WBC 21.8*   < >  --  18.4* 19.1* 18.7* 21.5* 12.9*  CREATININE 1.46*   < >  --  2.31* 1.43* 1.38* 1.10 1.26*  LATICACIDVEN 1.9  --  1.4  --   --   --   --   --    < > = values in this interval not displayed.    Estimated Creatinine Clearance: 104.8 mL/min (A) (by C-G formula based on SCr of 1.26 mg/dL (H)).    Allergies  Allergen Reactions  . Metformin And Related Other (See Comments)    GI upset    Antimicrobials this admission: Vanc 5/10>>5/15 Cefazolin 5/15>>  Dose adjustments this admission:   Microbiology results: 5/10 BCX: ngtd x 3d 5/11 Ucx: 100 colonies of enterobacter - not treating.  No symptoms 5/10 Covid: neg 5/12: foot abscess >>MSSA  Onnie Boer, PharmD, BCIDP, AAHIVP, CPP Infectious Disease Pharmacist 08/12/2019 11:41 AM

## 2019-08-12 NOTE — Progress Notes (Signed)
Subjective: POD #3 s/p right foot incision and drainage, washout. He is currently NPO as I was worried yesterday about persistent increase in WBC. He states that he is doing better this morning. Pain is improving to the foot. Denies any systemic complaints such as fevers, chills, nausea, vomiting. No acute changes since last appointment, and no other complaints at this time.   Objective: AAO x3, NAD Incision and drainage site to the dorsal lateral aspect of right foot with some tissue necrosis around the area. The wound base is fibrotic/granular. There is no purulence expressed. There is still surrounding erythema to the lateral foot and edema to the foot but overall continues to improve and looks better than yesterday. There is no areas of fluctuation or crepitation.  There is no malodor. There was mild tenderness to palpation over the incision site. I was able to press on the foot and not able to elicit significant pain or any purulence.  Old blister present at the distal aspect of the right hallux.  Stable wound submetatarsal 1 without injuries or pus.  Is no fluctuance or crepitation is very. LEFT second digit with some dried blood at the distal aspect.  There is no drainage/pus.No laceration. No pain with calf compression, warmth, erythema   Assessment: Cellulitis/abscess right lower extremity with improvement  Plan: He is continuing to improve. WBC has improved today to 12.9 and is afebrile. Creatine is slightly elevated at 1.26. At this point given the infection he has I would like to continue IV antibiotics through the weekend and local wound care. Will plan on return to the OR on Monday for debridement of the wound and possible ACell graft. Encourage elevation. Wound culture growing abundant staph aureus. Continue vancomycin for now. Urine culture with Enterobacter. Blood cultures NGTD.   Celesta Gentile, DPM O: [phone] C: [phone]

## 2019-08-13 DIAGNOSIS — L03115 Cellulitis of right lower limb: Secondary | ICD-10-CM

## 2019-08-13 LAB — GLUCOSE, CAPILLARY
Glucose-Capillary: 62 mg/dL — ABNORMAL LOW (ref 70–99)
Glucose-Capillary: 84 mg/dL (ref 70–99)
Glucose-Capillary: 118 mg/dL — ABNORMAL HIGH (ref 70–99)
Glucose-Capillary: 131 mg/dL — ABNORMAL HIGH (ref 70–99)
Glucose-Capillary: 91 mg/dL (ref 70–99)

## 2019-08-13 LAB — CBC WITH DIFFERENTIAL/PLATELET
Abs Immature Granulocytes: 0.27 10*3/uL — ABNORMAL HIGH (ref 0.00–0.07)
Basophils Absolute: 0 10*3/uL (ref 0.0–0.1)
Basophils Relative: 0 %
Eosinophils Absolute: 0.3 10*3/uL (ref 0.0–0.5)
Eosinophils Relative: 3 %
HCT: 42.1 % (ref 39.0–52.0)
Hemoglobin: 12.8 g/dL — ABNORMAL LOW (ref 13.0–17.0)
Immature Granulocytes: 2 %
Lymphocytes Relative: 22 %
Lymphs Abs: 2.9 10*3/uL (ref 0.7–4.0)
MCH: 29.1 pg (ref 26.0–34.0)
MCHC: 30.4 g/dL (ref 30.0–36.0)
MCV: 95.7 fL (ref 80.0–100.0)
Monocytes Absolute: 1 10*3/uL (ref 0.1–1.0)
Monocytes Relative: 8 %
Neutro Abs: 8.6 10*3/uL — ABNORMAL HIGH (ref 1.7–7.7)
Neutrophils Relative %: 65 %
Platelets: 457 10*3/uL — ABNORMAL HIGH (ref 150–400)
RBC: 4.4 MIL/uL (ref 4.22–5.81)
RDW: 15 % (ref 11.5–15.5)
WBC: 13.2 10*3/uL — ABNORMAL HIGH (ref 4.0–10.5)
nRBC: 0 % (ref 0.0–0.2)

## 2019-08-13 LAB — BASIC METABOLIC PANEL
Anion gap: 9 (ref 5–15)
BUN: 43 mg/dL — ABNORMAL HIGH (ref 6–20)
CO2: 24 mmol/L (ref 22–32)
Calcium: 8.9 mg/dL (ref 8.9–10.3)
Chloride: 105 mmol/L (ref 98–111)
Creatinine, Ser: 1.84 mg/dL — ABNORMAL HIGH (ref 0.61–1.24)
GFR calc Af Amer: 46 mL/min — ABNORMAL LOW (ref >60)
GFR calc non Af Amer: 40 mL/min — ABNORMAL LOW (ref >60)
Glucose, Bld: 153 mg/dL — ABNORMAL HIGH (ref 70–99)
Potassium: 4.5 mmol/L (ref 3.5–5.1)
Sodium: 138 mmol/L (ref 135–145)

## 2019-08-13 MED ORDER — INSULIN ASPART 100 UNIT/ML ~~LOC~~ SOLN
0.0000 [IU] | Freq: Three times a day (TID) | SUBCUTANEOUS | Status: DC
  Administered 2019-08-13: 1 [IU] via SUBCUTANEOUS
  Administered 2019-08-14: 2 [IU] via SUBCUTANEOUS
  Administered 2019-08-15 (×2): 5 [IU] via SUBCUTANEOUS
  Administered 2019-08-15: 7 [IU] via SUBCUTANEOUS
  Administered 2019-08-16: 2 [IU] via SUBCUTANEOUS

## 2019-08-13 MED ORDER — LACTATED RINGERS IV SOLN: INTRAVENOUS | Status: DC

## 2019-08-13 NOTE — Progress Notes (Signed)
Right foot dressing  Change done as per order.

## 2019-08-13 NOTE — Progress Notes (Signed)
PROGRESS NOTE    Jeffery [name]  HAL:[id number] DOB: [birth year] DOA: 08/07/2019 PCP: [name], MD    Brief Narrative:  59 year old gentleman with history of type 2 diabetes on insulin, hypertension, hyperlipidemia, paroxysmal atrial fibrillation status post cardioversion currently on Eliquis presented to the emergency department with persistent right foot ulcer and pain and swelling.  In the emergency room, found to have significant swelling, suspected osteomyelitis and admitted with IV antibiotics.   Assessment & Plan:   Principal Problem:   Severe sepsis with acute organ dysfunction due to methicillin susceptible Staphylococcus aureus (MSSA) (HCC) Active Problems:   Essential hypertension   Uncontrolled type 2 diabetes mellitus with diabetic polyneuropathy, without long-term current use of insulin (HCC)   Paroxysmal atrial fibrillation (HCC)   Right foot ulcer, with fat layer exposed ([place])   Cellulitis of right lower extremity   Mixed diabetic hyperlipidemia associated with type 2 diabetes mellitus ([place])   AKI (acute kidney injury) ([place])   Sepsis due to methicillin susceptible Staphylococcus aureus (MSSA) with acute renal failure ([place])  Sepsis present on admission due to localized foot infection with acute renal failure: Clinically improving after surgical incision and drainage.  WBC trending down. Treated with vancomycin.  Surgical cultures with MSSA.  Changed to Ancef. Due to significant infection, continued antibiotics with reexploration and skin graft planned for 5/17 by podiatry.  Acute kidney injury: Due to sepsis.  Fluctuates but is stable. Holding ARB.  Continue to monitor.  Keep on maintenance fluids today.  Recheck tomorrow morning.  Hypertension: Blood pressures fairly stable.  Continue beta-blockers.  Holding other antihypertensives.  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  A1c 7.1.  Continue home insulin  doses.  Blood sugars stabilizing after increasing doses of prandial insulin.  Paroxysmal atrial fibrillation: Currently sinus rhythm on metoprolol and Eliquis that he will continue.   DVT prophylaxis: Eliquis Code Status: Full code Family Communication: None Disposition Plan: Status is: Inpatient  Remains inpatient appropriate because:IV treatments appropriate due to intensity of illness or inability to take PO   Dispo: The patient is from: Home              Anticipated d/c is to: Home              Anticipated d/c date is: 2 days.              Patient currently is not medically stable to d/c.  Still has significantly elevated WBC count, swelling of the leg.  Patient will need IV antibiotics and another surgical procedure in the hospital.   Consultants:   Podiatry  Procedures:   Debridement  Antimicrobials:  Anti-infectives (From admission, onward)   Start     Dose/Rate Route Frequency Ordered Stop   08/12/19 1200  ceFAZolin (ANCEF) IVPB 2g/100 mL premix     2 g 200 mL/hr over 30 Minutes Intravenous Every 8 hours 08/12/19 1115     08/10/19 2200  vancomycin (VANCOREADY) IVPB 1250 mg/250 mL  Status:  Discontinued     1,250 mg 166.7 mL/hr over 90 Minutes Intravenous Every 12 hours 08/10/19 1541 08/12/19 1054   08/09/19 1000  vancomycin (VANCOREADY) IVPB 1500 mg/300 mL  Status:  Discontinued     1,500 mg 150 mL/hr over 120 Minutes Intravenous Every 24 hours 08/08/19 0909 08/10/19 1541   08/08/19 0800  vancomycin (VANCOREADY) IVPB 2000 mg/400 mL  Status:  Discontinued     2,000 mg 200 mL/hr over 120 Minutes Intravenous  Every 24 hours 08/08/19 0530 08/08/19 0909   08/07/19 2130  vancomycin (VANCOCIN) IVPB 1000 mg/200 mL premix     1,000 mg 200 mL/hr over 60 Minutes Intravenous  Once 08/07/19 2120 08/07/19 2312         Subjective: Seen and examined.  Afebrile.  He thinks his leg is slightly better.  Wound just examined by podiatry surgery.  Objective: Vitals:   08/12/19  1620 08/12/19 2107 08/13/19 0500 08/13/19 0955  BP: 118/67 117/65 121/70 113/63  Pulse: (!) 51 (!) 47 (!) 58 (!) 48  Resp: 18 19 18 18   Temp:  97.8 F (36.6 C) 98.4 F (36.9 C) 97.7 F (36.5 C)  TempSrc:  Oral Oral Oral  SpO2: 94% 100% 100% 100%  Weight:  (!) 162.9 kg    Height:        Intake/Output Summary (Last 24 hours) at 08/13/2019 1208 Last data filed at 08/13/2019 0600 Gross per 24 hour  Intake 920 ml  Output 275 ml  Net 645 ml   Filed Weights   08/10/19 2045 08/12/19 0423 08/12/19 2107  Weight: (!) 162.2 kg (!) 162.9 kg (!) 162.9 kg    Examination:  General exam: Appears calm and comfortable, on room air. Respiratory system: Clear to auscultation. Respiratory effort normal. Cardiovascular system: S1 & S2 heard, RRR.  Gastrointestinal system: Abdomen is nondistended, soft and nontender.  Obese and pendulous  Central nervous system: Alert and oriented. No focal neurological deficits. Extremities: Symmetric 5 x 5 power. Skin: Right lower leg and ankle swelling.  Swelling and tenderness receding from the leg. Surgical wounds on the dorsal and plantar aspect, examined and dressing applied by surgery.  Pictures available in the chart.   Data Reviewed: I have personally reviewed following labs and imaging studies  CBC: Recent Labs  Lab 08/09/19 1010 08/10/19 0517 08/11/19 0538 08/12/19 0416 08/13/19 0333  WBC 19.1* 18.7* 21.5* 12.9* 13.2*  NEUTROABS 16.1* 17.2* 19.0* 9.7* 8.6*  HGB 12.3* 12.0* 11.4* 11.3* 12.8*  HCT 39.2 37.9* 36.7* 36.2* 42.1  MCV 92.2 91.5 93.4 93.1 95.7  PLT 290 330 380 375 644*   Basic Metabolic Panel: Recent Labs  Lab 08/08/19 0354 08/08/19 0354 08/09/19 1010 08/10/19 0517 08/11/19 0538 08/12/19 0416 08/13/19 0333  NA 133*   < > 133* 134* 139 137 138  K 3.9   < > 4.1 5.3* 4.9 4.5 4.5  CL 99   < > 102 105 104 108 105  CO2 21*   < > 19* 19* 26 23 24   GLUCOSE 329*   < > 234* 347* 128* 201* 153*  BUN 28*   < > 28* 27* 26* 33* 43*   CREATININE 2.31*   < > 1.43* 1.38* 1.10 1.26* 1.84*  CALCIUM 8.4*   < > 8.7* 8.9 9.1 8.5* 8.9  MG 2.1  --  2.2  --   --   --   --   PHOS  --   --  3.4  --   --   --   --    < > = values in this interval not displayed.   GFR: Estimated Creatinine Clearance: 71.7 mL/min (A) (by C-G formula based on SCr of 1.84 mg/dL (H)). Liver Function Tests: Recent Labs  Lab 08/07/19 1900 08/08/19 0354  AST 28 27  ALT 33 31  ALKPHOS 82 79  BILITOT 1.2 1.0  PROT 8.0 7.1  ALBUMIN 2.9* 2.5*   No results for input(s): LIPASE, AMYLASE in the last  168 hours. No results for input(s): AMMONIA in the last 168 hours. Coagulation Profile: Recent Labs  Lab 08/07/19 1900  INR 1.7*   Cardiac Enzymes: No results for input(s): CKTOTAL, CKMB, CKMBINDEX, TROPONINI in the last 168 hours. BNP (last 3 results) No results for input(s): PROBNP in the last 8760 hours. HbA1C: No results for input(s): HGBA1C in the last 72 hours. CBG: Recent Labs  Lab 08/12/19 1619 08/12/19 2108 08/13/19 0650 08/13/19 0727 08/13/19 1145  GLUCAP 196* 108* 62* 84 118*   Lipid Profile: No results for input(s): CHOL, HDL, LDLCALC, TRIG, CHOLHDL, LDLDIRECT in the last 72 hours. Thyroid Function Tests: No results for input(s): TSH, T4TOTAL, FREET4, T3FREE, THYROIDAB in the last 72 hours. Anemia Panel: No results for input(s): VITAMINB12, FOLATE, FERRITIN, TIBC, IRON, RETICCTPCT in the last 72 hours. Sepsis Labs: Recent Labs  Lab 08/07/19 1900 08/07/19 2145  LATICACIDVEN 1.9 1.4    Recent Results (from the past 240 hour(s))  Culture, blood (Routine x 2)     Status: None   Collection Time: 08/07/19  7:00 PM   Specimen: BLOOD  Result Value Ref Range Status   Specimen Description BLOOD LEFT ARM  Final   Special Requests   Final    BOTTLES DRAWN AEROBIC AND ANAEROBIC Blood Culture results may not be optimal due to an inadequate volume of blood received in culture bottles   Culture   Final    NO GROWTH 5 DAYS Performed  at [facility], [place] [address], [place], [place] [address]    Report Status 08/12/2019 FINAL  Final  Culture, blood (Routine x 2)     Status: None   Collection Time: 08/07/19  7:02 PM   Specimen: BLOOD  Result Value Ref Range Status   Specimen Description BLOOD RIGHT HAND  Final   Special Requests   Final    BOTTLES DRAWN AEROBIC AND ANAEROBIC Blood Culture results may not be optimal due to an inadequate volume of blood received in culture bottles   Culture   Final    NO GROWTH 5 DAYS Performed at [facility], [place] [address], [place], [place] [address]    Report Status 08/12/2019 FINAL  Final  SARS Coronavirus 2 by RT PCR (hospital order, performed in [facility] hospital lab) Nasopharyngeal Nasopharyngeal Swab     Status: None   Collection Time: 08/07/19  9:25 PM   Specimen: Nasopharyngeal Swab  Result Value Ref Range Status   SARS Coronavirus 2 NEGATIVE NEGATIVE Final    Comment: (NOTE) SARS-CoV-2 target nucleic acids are NOT DETECTED. The SARS-CoV-2 RNA is generally detectable in upper and lower respiratory specimens during the acute phase of infection. The lowest concentration of SARS-CoV-2 viral copies this assay can detect is 250 copies / mL. A negative result does not preclude SARS-CoV-2 infection and should not be used as the sole basis for treatment or other patient management decisions.  A negative result may occur with improper specimen collection / handling, submission of specimen other than nasopharyngeal swab, presence of viral mutation(s) within the areas targeted by this assay, and inadequate number of viral copies (<250 copies / mL). A negative result must be combined with clinical observations, patient history, and epidemiological information. Fact Sheet for Patients:   StrictlyIdeas.no Fact Sheet for Healthcare Providers: BankingDealers.co.za This test is not yet approved or cleared  by the [place] FDA and has been authorized for detection and/or diagnosis of SARS-CoV-2 by FDA under an Emergency Use Authorization (EUA).  This EUA will remain in effect (meaning this test can be used) for the duration of the COVID-19 declaration under Section 564(b)(1) of the Act, 21 U.S.C. section 360bbb-3(b)(1), unless the authorization is terminated or revoked sooner. Performed at [facility], [place] [address], [place], [place] [address]   Urine culture     Status: Abnormal   Collection Time: 08/08/19  5:36 AM   Specimen: In/Out Cath Urine  Result Value Ref Range Status   Specimen Description IN/OUT CATH URINE  Final   Special Requests   Final    NONE Performed at [facility], [place] [address], [place], [place] [address]    Culture 100 COLONIES/mL ENTEROBACTER AEROGENES (A)  Final   Report Status 08/10/2019 FINAL  Final   Organism ID, Bacteria ENTEROBACTER AEROGENES (A)  Final      Susceptibility   Enterobacter aerogenes - MIC*    CEFAZOLIN >=64 RESISTANT Resistant     CEFTRIAXONE <=1 SENSITIVE Sensitive     CIPROFLOXACIN <=0.25 SENSITIVE Sensitive     GENTAMICIN <=1 SENSITIVE Sensitive     IMIPENEM 2 SENSITIVE Sensitive     NITROFURANTOIN 64 INTERMEDIATE Intermediate     TRIMETH/SULFA <=20 SENSITIVE Sensitive     PIP/TAZO 8 SENSITIVE Sensitive     * 100 COLONIES/mL ENTEROBACTER AEROGENES  Aerobic/Anaerobic Culture (surgical/deep wound)     Status: None (Preliminary result)   Collection Time: 08/09/19  8:43 PM   Specimen: PATH Other; Tissue  Result Value Ref Range Status   Specimen Description FOOT RIGHT  Final   Special Requests NONE  Final   Gram Stain   Final    MODERATE WBC PRESENT,BOTH PMN AND MONONUCLEAR MODERATE GRAM POSITIVE COCCI IN PAIRS Performed at [facility], [address], [place], [place] [address]    Culture   Final    ABUNDANT STAPHYLOCOCCUS AUREUS NO ANAEROBES ISOLATED; CULTURE IN PROGRESS FOR 5 DAYS    Report Status PENDING   Incomplete   Organism ID, Bacteria STAPHYLOCOCCUS AUREUS  Final      Susceptibility   Staphylococcus aureus - MIC*    CIPROFLOXACIN <=0.5 SENSITIVE Sensitive     ERYTHROMYCIN <=0.25 SENSITIVE Sensitive     GENTAMICIN <=0.5 SENSITIVE Sensitive     OXACILLIN <=0.25 SENSITIVE Sensitive     TETRACYCLINE <=1 SENSITIVE Sensitive     VANCOMYCIN <=0.5 SENSITIVE Sensitive     TRIMETH/SULFA <=10 SENSITIVE Sensitive     CLINDAMYCIN <=0.25 SENSITIVE Sensitive     RIFAMPIN <=0.5 SENSITIVE Sensitive     Inducible Clindamycin NEGATIVE Sensitive     * ABUNDANT STAPHYLOCOCCUS AUREUS  Fungus Culture With Stain     Status: None (Preliminary result)   Collection Time: 08/09/19  8:43 PM  Result Value Ref Range Status   Fungus Stain Final report  Final    Comment: (NOTE) Performed At: [facility] [address] [place], [place] [id number] Rush Farmer MD [id number]    Fungus (Mycology) Culture PENDING  Incomplete   Fungal Source FOOT  Final    Comment: RIGHT Performed at [facility], [place] [address] [place], [place] [address]   Fungus Culture Result     Status: None   Collection Time: 08/09/19  8:43 PM  Result Value Ref Range Status   Result 1 Comment  Final    Comment: (NOTE) KOH/Calcofluor preparation:  no fungus observed. Performed At: [facility] [place], [place] [id number] Rush Farmer MD [id number]  Radiology Studies: No results found.      Scheduled Meds: . acetaminophen  1,000 mg Oral Once  . amiodarone  200 mg Oral BID  . apixaban  5 mg Oral BID  . atorvastatin  20 mg Oral Daily  . insulin aspart  0-9 Units Subcutaneous TID WC  . insulin aspart  12 Units Subcutaneous TID WC  . insulin glargine  51 Units Subcutaneous BID  . metoprolol tartrate  100 mg Oral BID   Continuous Infusions: . sodium chloride Stopped (08/10/19 2357)  .  ceFAZolin (ANCEF) IV 2 g (08/13/19 0457)  . lactated ringers 75 mL/hr at 08/13/19  0837     LOS: 5 days    Time spent: 25 minutes    Barb Merino, MD [facility] Pager [phone]

## 2019-08-13 NOTE — Progress Notes (Signed)
Subjective: POD #4 s/p right foot incision and drainage, washout.  States he is feeling well this morning.  He still in some pain of the foot but he states that most of his pain is coming from his back.  He has no new concerns today.  Scheduled for surgery tomorrow. Denies any systemic complaints such as fevers, chills, nausea, vomiting. No acute changes since last appointment, and no other complaints at this time.   Objective: AAO x3, NAD Incision and drainage site to the dorsal lateral aspect of right foot with some tissue necrosis around the area and there is fibrotic tissue within the wound.  Serosanguineous drainage expressed.  There is decreasing erythema to the foot/leg.  There is no area of fluctuation or crepitation.  There is no tenderness palpation on exam today.  There is no malodor. LEFT second digit with some dried blood at the distal aspect.  There is no drainage/pus. No pain with calf compression, warmth, erythema   Assessment: Cellulitis/abscess right lower extremity with improvement  Plan: He is continuing to improve. WBC 13.2 and is afebrile. Creatine is elevated at 1.84.  Wound cultures growing abundant staph aureus.  He is on vancomycin. Blood cultures NGTD.  Given the wound recommend further wound debridement and graft application possibly.  He is scheduled for surgery tomorrow at 5:30 PM.  He will be n.p.o. after 8 AM tomorrow morning.  We can discuss the surgery as well as postoperative course.  Discussed alternatives, risks, complications including, but not limited to, spread of infection, delayed or nonhealing and still at risk of amputation.  Also discussed general risks of surgery.  We will plan as scheduled.  Celesta Gentile, DPM O: [phone] C: [phone]

## 2019-08-13 NOTE — Plan of Care (Signed)
  Problem: Coping: Goal: Level of anxiety will decrease Outcome: Adequate for Discharge   

## 2019-08-13 NOTE — Discharge Instructions (Signed)

## 2019-08-14 ENCOUNTER — Encounter (HOSPITAL_COMMUNITY)
Admission: EM | Disposition: A | Discharge status: Home / Self Care | Payer: Self-pay | Source: Home / Self Care | Attending: Internal Medicine

## 2019-08-14 ENCOUNTER — Inpatient Hospital Stay (HOSPITAL_COMMUNITY): Payer: Medicaid Other | Admitting: Certified Registered Nurse Anesthetist

## 2019-08-14 ENCOUNTER — Encounter (HOSPITAL_COMMUNITY): Payer: Self-pay | Admitting: Internal Medicine

## 2019-08-14 DIAGNOSIS — L03031 Cellulitis of right toe: Secondary | ICD-10-CM

## 2019-08-14 HISTORY — PX: WOUND DEBRIDEMENT: SHX247

## 2019-08-14 LAB — CBC WITH DIFFERENTIAL/PLATELET
Abs Immature Granulocytes: 0.38 10*3/uL — ABNORMAL HIGH (ref 0.00–0.07)
Basophils Absolute: 0 10*3/uL (ref 0.0–0.1)
Basophils Relative: 0 %
Eosinophils Absolute: 0.4 10*3/uL (ref 0.0–0.5)
Eosinophils Relative: 3 %
HCT: 36.7 % — ABNORMAL LOW (ref 39.0–52.0)
Hemoglobin: 11.3 g/dL — ABNORMAL LOW (ref 13.0–17.0)
Immature Granulocytes: 3 %
Lymphocytes Relative: 20 %
Lymphs Abs: 2.4 10*3/uL (ref 0.7–4.0)
MCH: 29.1 pg (ref 26.0–34.0)
MCHC: 30.8 g/dL (ref 30.0–36.0)
MCV: 94.6 fL (ref 80.0–100.0)
Monocytes Absolute: 1.2 10*3/uL — ABNORMAL HIGH (ref 0.1–1.0)
Monocytes Relative: 10 %
Neutro Abs: 7.7 10*3/uL (ref 1.7–7.7)
Neutrophils Relative %: 64 %
Platelets: 352 10*3/uL (ref 150–400)
RBC: 3.88 MIL/uL — ABNORMAL LOW (ref 4.22–5.81)
RDW: 14.9 % (ref 11.5–15.5)
WBC: 12 10*3/uL — ABNORMAL HIGH (ref 4.0–10.5)
nRBC: 0 % (ref 0.0–0.2)

## 2019-08-14 LAB — GLUCOSE, CAPILLARY
Glucose-Capillary: 112 mg/dL — ABNORMAL HIGH (ref 70–99)
Glucose-Capillary: 176 mg/dL — ABNORMAL HIGH (ref 70–99)
Glucose-Capillary: 61 mg/dL — ABNORMAL LOW (ref 70–99)
Glucose-Capillary: 78 mg/dL (ref 70–99)
Glucose-Capillary: 82 mg/dL (ref 70–99)
Glucose-Capillary: 67 mg/dL — ABNORMAL LOW (ref 70–99)
Glucose-Capillary: 82 mg/dL (ref 70–99)
Glucose-Capillary: 97 mg/dL (ref 70–99)

## 2019-08-14 LAB — SURGICAL PCR SCREEN
MRSA, PCR: NEGATIVE
Staphylococcus aureus: NEGATIVE

## 2019-08-14 LAB — BASIC METABOLIC PANEL
Anion gap: 8 (ref 5–15)
BUN: 54 mg/dL — ABNORMAL HIGH (ref 6–20)
CO2: 21 mmol/L — ABNORMAL LOW (ref 22–32)
Calcium: 8.2 mg/dL — ABNORMAL LOW (ref 8.9–10.3)
Chloride: 104 mmol/L (ref 98–111)
Creatinine, Ser: 1.83 mg/dL — ABNORMAL HIGH (ref 0.61–1.24)
GFR calc Af Amer: 46 mL/min — ABNORMAL LOW (ref >60)
GFR calc non Af Amer: 40 mL/min — ABNORMAL LOW (ref >60)
Glucose, Bld: 158 mg/dL — ABNORMAL HIGH (ref 70–99)
Potassium: 4.5 mmol/L (ref 3.5–5.1)
Sodium: 133 mmol/L — ABNORMAL LOW (ref 135–145)

## 2019-08-14 SURGERY — DEBRIDEMENT, WOUND
Anesthesia: General | Site: Foot | Laterality: Right

## 2019-08-14 MED ORDER — PROPOFOL 10 MG/ML IV BOLUS
INTRAVENOUS | Status: DC | PRN
  Administered 2019-08-14: 200 mg via INTRAVENOUS

## 2019-08-14 MED ORDER — ONDANSETRON HCL 4 MG/2ML IJ SOLN
INTRAMUSCULAR | Status: AC
  Filled 2019-08-14: qty 2

## 2019-08-14 MED ORDER — FENTANYL CITRATE (PF) 100 MCG/2ML IJ SOLN: 25.0000 ug | INTRAMUSCULAR | Status: DC | PRN

## 2019-08-14 MED ORDER — DEXTROSE 50 % IV SOLN
INTRAVENOUS | Status: AC
  Administered 2019-08-14: 25 mL via INTRAVENOUS
  Filled 2019-08-14: qty 50

## 2019-08-14 MED ORDER — ONDANSETRON HCL 4 MG/2ML IJ SOLN
INTRAMUSCULAR | Status: DC | PRN
  Administered 2019-08-14: 4 mg via INTRAVENOUS

## 2019-08-14 MED ORDER — CHLORHEXIDINE GLUCONATE CLOTH 2 % EX PADS
6.0000 | MEDICATED_PAD | Freq: Once | CUTANEOUS | Status: AC
  Administered 2019-08-14: 6 via TOPICAL

## 2019-08-14 MED ORDER — PHENYLEPHRINE HCL-NACL 10-0.9 MG/250ML-% IV SOLN
INTRAVENOUS | Status: DC | PRN
Start: 2019-08-14 — End: 2019-08-14
  Administered 2019-08-14: 50 ug/min via INTRAVENOUS

## 2019-08-14 MED ORDER — PHENYLEPHRINE HCL (PRESSORS) 10 MG/ML IV SOLN
INTRAVENOUS | Status: DC | PRN
Start: 2019-08-14 — End: 2019-08-14
  Administered 2019-08-14: 80 ug via INTRAVENOUS

## 2019-08-14 MED ORDER — DEXTROSE 50 % IV SOLN
25.0000 mL | Freq: Once | INTRAVENOUS | Status: AC
  Administered 2019-08-14: 25 mL via INTRAVENOUS

## 2019-08-14 MED ORDER — DEXAMETHASONE SODIUM PHOSPHATE 10 MG/ML IJ SOLN
INTRAMUSCULAR | Status: DC | PRN
  Administered 2019-08-14: 4 mg via INTRAVENOUS

## 2019-08-14 MED ORDER — PHENYLEPHRINE 40 MCG/ML (10ML) SYRINGE FOR IV PUSH (FOR BLOOD PRESSURE SUPPORT)
PREFILLED_SYRINGE | INTRAVENOUS | Status: AC
  Filled 2019-08-14: qty 10

## 2019-08-14 MED ORDER — MIDAZOLAM HCL 2 MG/2ML IJ SOLN
INTRAMUSCULAR | Status: AC
  Filled 2019-08-14: qty 2

## 2019-08-14 MED ORDER — LIDOCAINE HCL (CARDIAC) PF 100 MG/5ML IV SOSY
PREFILLED_SYRINGE | INTRAVENOUS | Status: DC | PRN
  Administered 2019-08-14: 100 mg via INTRAVENOUS

## 2019-08-14 MED ORDER — FENTANYL CITRATE (PF) 100 MCG/2ML IJ SOLN
INTRAMUSCULAR | Status: DC | PRN
  Administered 2019-08-14: 50 ug via INTRAVENOUS

## 2019-08-14 MED ORDER — PHENYLEPHRINE 40 MCG/ML (10ML) SYRINGE FOR IV PUSH (FOR BLOOD PRESSURE SUPPORT)
PREFILLED_SYRINGE | INTRAVENOUS | Status: AC
  Filled 2019-08-14: qty 20

## 2019-08-14 MED ORDER — DEXTROSE 50 % IV SOLN
INTRAVENOUS | Status: AC
  Filled 2019-08-14: qty 50

## 2019-08-14 MED ORDER — INSULIN GLARGINE 100 UNIT/ML ~~LOC~~ SOLN
30.0000 [IU] | Freq: Two times a day (BID) | SUBCUTANEOUS | Status: DC
  Administered 2019-08-14 – 2019-08-15 (×2): 30 [IU] via SUBCUTANEOUS
  Filled 2019-08-14 (×3): qty 0.3

## 2019-08-14 MED ORDER — DEXTROSE 50 % IV SOLN: 25.0000 mL | Freq: Once | INTRAVENOUS | Status: AC

## 2019-08-14 MED ORDER — FENTANYL CITRATE (PF) 250 MCG/5ML IJ SOLN
INTRAMUSCULAR | Status: AC
  Filled 2019-08-14: qty 5

## 2019-08-14 MED ORDER — ACETAMINOPHEN 500 MG PO TABS: 1000.0000 mg | ORAL_TABLET | Freq: Once | ORAL | Status: DC

## 2019-08-14 MED ORDER — INSULIN ASPART 100 UNIT/ML ~~LOC~~ SOLN
8.0000 [IU] | Freq: Three times a day (TID) | SUBCUTANEOUS | Status: DC
  Administered 2019-08-15 – 2019-08-16 (×5): 8 [IU] via SUBCUTANEOUS

## 2019-08-14 MED ORDER — MIDAZOLAM HCL 2 MG/2ML IJ SOLN
INTRAMUSCULAR | Status: DC | PRN
  Administered 2019-08-14: 1 mg via INTRAVENOUS

## 2019-08-14 MED ORDER — LACTATED RINGERS IV SOLN: INTRAVENOUS | Status: DC

## 2019-08-14 MED ORDER — PROPOFOL 10 MG/ML IV BOLUS
INTRAVENOUS | Status: AC
  Filled 2019-08-14: qty 20

## 2019-08-14 MED ORDER — EPHEDRINE SULFATE 50 MG/ML IJ SOLN
INTRAMUSCULAR | Status: DC | PRN
  Administered 2019-08-14: 10 mg via INTRAVENOUS
  Administered 2019-08-14: 15 mg via INTRAVENOUS
  Administered 2019-08-14: 10 mg via INTRAVENOUS

## 2019-08-14 SURGICAL SUPPLY — 41 items
BLADE LONG MED 31MMX9MM (MISCELLANEOUS)
BLADE LONG MED 31X9 (MISCELLANEOUS) IMPLANT
BNDG CONFORM 2 STRL LF (GAUZE/BANDAGES/DRESSINGS) ×3 IMPLANT
BNDG ELASTIC 3X5.8 VLCR STR LF (GAUZE/BANDAGES/DRESSINGS) ×3 IMPLANT
BNDG ELASTIC 4X5.8 VLCR STR LF (GAUZE/BANDAGES/DRESSINGS) ×3 IMPLANT
BNDG ESMARK 4X9 LF (GAUZE/BANDAGES/DRESSINGS) ×3 IMPLANT
BNDG GAUZE ELAST 4 BULKY (GAUZE/BANDAGES/DRESSINGS) ×3 IMPLANT
COVER WAND RF STERILE (DRAPES) ×3 IMPLANT
CUFF TOURN SGL QUICK 18X4 (TOURNIQUET CUFF) IMPLANT
DRSG CUTIMED SORBACT 7X9 (GAUZE/BANDAGES/DRESSINGS) ×3 IMPLANT
DRSG EMULSION OIL 3X3 NADH (GAUZE/BANDAGES/DRESSINGS) ×3 IMPLANT
DURAPREP 26ML APPLICATOR (WOUND CARE) ×3 IMPLANT
ELECT REM PT RETURN 9FT ADLT (ELECTROSURGICAL) ×3
ELECTRODE REM PT RTRN 9FT ADLT (ELECTROSURGICAL) ×1 IMPLANT
GAUZE SPONGE 4X4 12PLY STRL (GAUZE/BANDAGES/DRESSINGS) ×3 IMPLANT
GLOVE BIO SURGEON STRL SZ8 (GLOVE) ×6 IMPLANT
GOWN STRL REUS W/ TWL LRG LVL3 (GOWN DISPOSABLE) ×1 IMPLANT
GOWN STRL REUS W/ TWL XL LVL3 (GOWN DISPOSABLE) ×1 IMPLANT
GOWN STRL REUS W/TWL LRG LVL3 (GOWN DISPOSABLE) ×3
GOWN STRL REUS W/TWL XL LVL3 (GOWN DISPOSABLE) ×3
KIT BASIN OR (CUSTOM PROCEDURE TRAY) ×3 IMPLANT
MATRIX WOUND 3-LAYER 7X10 (Tissue) ×2 IMPLANT
MICROMATRIX 1000MG (Tissue) ×3 IMPLANT
MICROMATRIX 500MG (Tissue) ×3 IMPLANT
NEEDLE 18GX1X1/2 (RX/OR ONLY) (NEEDLE) IMPLANT
NEEDLE HYPO 25X1 1.5 SAFETY (NEEDLE) ×3 IMPLANT
NS IRRIG 1000ML POUR BTL (IV SOLUTION) IMPLANT
PACK ORTHO EXTREMITY (CUSTOM PROCEDURE TRAY) IMPLANT
PADDING CAST ABS 4INX4YD NS (CAST SUPPLIES) ×2
PADDING CAST ABS COTTON 4X4 ST (CAST SUPPLIES) ×1 IMPLANT
PROBE DEBRIDE SONICVAC MISONIX (TIP) ×3 IMPLANT
SOLUTION PARTIC MCRMTRX 1000MG (Tissue) ×1 IMPLANT
SOLUTION PARTIC MCRMTRX 500MG (Tissue) ×1 IMPLANT
SUT MNCRL AB 3-0 PS2 18 (SUTURE) ×3 IMPLANT
SUT MNCRL AB 4-0 PS2 18 (SUTURE) IMPLANT
SUT MON AB 5-0 PS2 18 (SUTURE) ×3 IMPLANT
SUT PROLENE 4 0 PS 2 18 (SUTURE) ×3 IMPLANT
SYR 10ML LL (SYRINGE) IMPLANT
TUBE IRRIGATION SET MISONIX (TUBING) ×3 IMPLANT
UNDERPAD 30X36 HEAVY ABSORB (UNDERPADS AND DIAPERS) ×3 IMPLANT
WOUND MATRIX 3-LAYER 7X10 (Tissue) ×1 IMPLANT

## 2019-08-14 NOTE — Progress Notes (Signed)
PROGRESS NOTE    Jeffery [name]  WNU:[id number] DOB: Apr 15, 1960 DOA: 08/07/2019 PCP: [name], MD    Brief Narrative:  59 year old gentleman with history of type 2 diabetes on insulin, hypertension, hyperlipidemia, paroxysmal atrial fibrillation status post cardioversion currently on Eliquis presented to the emergency department with persistent right foot ulcer and pain and swelling.  In the emergency room, found to have significant swelling, suspected osteomyelitis and admitted with IV antibiotics.   Assessment & Plan:   Principal Problem:   Severe sepsis with acute organ dysfunction due to methicillin susceptible Staphylococcus aureus (MSSA) (HCC) Active Problems:   Essential hypertension   Uncontrolled type 2 diabetes mellitus with diabetic polyneuropathy, without long-term current use of insulin (HCC)   Paroxysmal atrial fibrillation (HCC)   Right foot ulcer, with fat layer exposed ([place])   Cellulitis of right lower extremity   Mixed diabetic hyperlipidemia associated with type 2 diabetes mellitus ([place])   AKI (acute kidney injury) ([place])   Sepsis due to methicillin susceptible Staphylococcus aureus (MSSA) with acute renal failure ([place])  Sepsis present on admission due to localized foot infection with acute renal failure: Clinically improving after surgical incision and drainage.  WBC trending down. Treated with vancomycin.  Surgical cultures with MSSA.  Changed to Ancef. Due to significant infection, continued antibiotics with reexploration and skin graft planned for today.  Acute kidney injury: Due to sepsis.  Fluctuates but is stable. Holding ARB.  Continue to monitor.  Keep on maintenance fluids today.  Recheck tomorrow morning.  Hypertension: Blood pressures fairly stable.  Continue beta-blockers.  Holding other antihypertensives.  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  A1c 7.1.  Continue home insulin doses.  Blood  sugars stabilizing after increasing doses of prandial insulin.  Paroxysmal atrial fibrillation: Currently sinus rhythm on metoprolol and Eliquis that he will continue.   DVT prophylaxis: Eliquis Code Status: Full code Family Communication: None Disposition Plan: Status is: Inpatient  Remains inpatient appropriate because:IV treatments appropriate due to intensity of illness or inability to take PO   Dispo: The patient is from: Home              Anticipated d/c is to: Home              Anticipated d/c date is: 2 days.              Patient currently is not medically stable to d/c.  Still has significantly elevated WBC count, swelling of the leg.  Patient will need IV antibiotics and another surgical procedure in the hospital. Plan for surgical procedure today.   Consultants:   Podiatry  Procedures:   Debridement  Antimicrobials:  Anti-infectives (From admission, onward)   Start     Dose/Rate Route Frequency Ordered Stop   08/12/19 1200  ceFAZolin (ANCEF) IVPB 2g/100 mL premix     2 g 200 mL/hr over 30 Minutes Intravenous Every 8 hours 08/12/19 1115     08/10/19 2200  vancomycin (VANCOREADY) IVPB 1250 mg/250 mL  Status:  Discontinued     1,250 mg 166.7 mL/hr over 90 Minutes Intravenous Every 12 hours 08/10/19 1541 08/12/19 1054   08/09/19 1000  vancomycin (VANCOREADY) IVPB 1500 mg/300 mL  Status:  Discontinued     1,500 mg 150 mL/hr over 120 Minutes Intravenous Every 24 hours 08/08/19 0909 08/10/19 1541   08/08/19 0800  vancomycin (VANCOREADY) IVPB 2000 mg/400 mL  Status:  Discontinued     2,000 mg 200 mL/hr over  120 Minutes Intravenous Every 24 hours 08/08/19 0530 08/08/19 0909   08/07/19 2130  vancomycin (VANCOCIN) IVPB 1000 mg/200 mL premix     1,000 mg 200 mL/hr over 60 Minutes Intravenous  Once 08/07/19 2120 08/07/19 2312         Subjective: Seen and examined.  No overnight events.  Afebrile.  Objective: Vitals:   08/13/19 1649 08/13/19 2122 08/14/19 0520  08/14/19 0842  BP: (!) 147/71 128/78 (!) 149/84 121/72  Pulse: (!) 53 (!) 52 (!) 54 (!) 48  Resp: 18 19 19 18   Temp: (!) 97.5 F (36.4 C) 97.9 F (36.6 C) 97.8 F (36.6 C) 98 F (36.7 C)  TempSrc: Oral   Oral  SpO2: 90% 98% 95% 94%  Weight:      Height:        Intake/Output Summary (Last 24 hours) at 08/14/2019 1304 Last data filed at 08/14/2019 1224 Gross per 24 hour  Intake 3374.96 ml  Output 1425 ml  Net 1949.96 ml   Filed Weights   08/10/19 2045 08/12/19 0423 08/12/19 2107  Weight: (!) 162.2 kg (!) 162.9 kg (!) 162.9 kg    Examination:  General exam: Appears calm and comfortable, on room air. Respiratory system: Clear to auscultation. Respiratory effort normal. Cardiovascular system: S1 & S2 heard, RRR.  Gastrointestinal system: Abdomen is nondistended, soft and nontender.  Obese and pendulous  Central nervous system: Alert and oriented. No focal neurological deficits. Extremities: Symmetric 5 x 5 power. Skin: Right lower leg and ankle swelling.  Swelling and tenderness receding from the leg. Surgical wounds on the dorsal and plantar aspect, examined and dressing applied by surgery.  Pictures available in the chart.   Data Reviewed: I have personally reviewed following labs and imaging studies  CBC: Recent Labs  Lab 08/10/19 0517 08/11/19 0538 08/12/19 0416 08/13/19 0333 08/14/19 0506  WBC 18.7* 21.5* 12.9* 13.2* 12.0*  NEUTROABS 17.2* 19.0* 9.7* 8.6* 7.7  HGB 12.0* 11.4* 11.3* 12.8* 11.3*  HCT 37.9* 36.7* 36.2* 42.1 36.7*  MCV 91.5 93.4 93.1 95.7 94.6  PLT 330 380 375 457* 502   Basic Metabolic Panel: Recent Labs  Lab 08/08/19 0354 08/08/19 0354 08/09/19 1010 08/09/19 1010 08/10/19 0517 08/11/19 0538 08/12/19 0416 08/13/19 0333 08/14/19 0506  NA 133*   < > 133*   < > 134* 139 137 138 133*  K 3.9   < > 4.1   < > 5.3* 4.9 4.5 4.5 4.5  CL 99   < > 102   < > 105 104 108 105 104  CO2 21*   < > 19*   < > 19* 26 23 24  21*  GLUCOSE 329*   < > 234*    < > 347* 128* 201* 153* 158*  BUN 28*   < > 28*   < > 27* 26* 33* 43* 54*  CREATININE 2.31*   < > 1.43*   < > 1.38* 1.10 1.26* 1.84* 1.83*  CALCIUM 8.4*   < > 8.7*   < > 8.9 9.1 8.5* 8.9 8.2*  MG 2.1  --  2.2  --   --   --   --   --   --   PHOS  --   --  3.4  --   --   --   --   --   --    < > = values in this interval not displayed.   GFR: Estimated Creatinine Clearance: 72.1 mL/min (A) (by C-G formula based  on SCr of 1.83 mg/dL (H)). Liver Function Tests: Recent Labs  Lab 08/07/19 1900 08/08/19 0354  AST 28 27  ALT 33 31  ALKPHOS 82 79  BILITOT 1.2 1.0  PROT 8.0 7.1  ALBUMIN 2.9* 2.5*   No results for input(s): LIPASE, AMYLASE in the last 168 hours. No results for input(s): AMMONIA in the last 168 hours. Coagulation Profile: Recent Labs  Lab 08/07/19 1900  INR 1.7*   Cardiac Enzymes: No results for input(s): CKTOTAL, CKMB, CKMBINDEX, TROPONINI in the last 168 hours. BNP (last 3 results) No results for input(s): PROBNP in the last 8760 hours. HbA1C: No results for input(s): HGBA1C in the last 72 hours. CBG: Recent Labs  Lab 08/13/19 1145 08/13/19 1644 08/13/19 2122 08/14/19 0649 08/14/19 1120  GLUCAP 118* 131* 91 112* 176*   Lipid Profile: No results for input(s): CHOL, HDL, LDLCALC, TRIG, CHOLHDL, LDLDIRECT in the last 72 hours. Thyroid Function Tests: No results for input(s): TSH, T4TOTAL, FREET4, T3FREE, THYROIDAB in the last 72 hours. Anemia Panel: No results for input(s): VITAMINB12, FOLATE, FERRITIN, TIBC, IRON, RETICCTPCT in the last 72 hours. Sepsis Labs: Recent Labs  Lab 08/07/19 1900 08/07/19 2145  LATICACIDVEN 1.9 1.4    Recent Results (from the past 240 hour(s))  Culture, blood (Routine x 2)     Status: None   Collection Time: 08/07/19  7:00 PM   Specimen: BLOOD  Result Value Ref Range Status   Specimen Description BLOOD LEFT ARM  Final   Special Requests   Final    BOTTLES DRAWN AEROBIC AND ANAEROBIC Blood Culture results may not be  optimal due to an inadequate volume of blood received in culture bottles   Culture   Final    NO GROWTH 5 DAYS Performed at [facility], [place] [address], [place], [place] [address]    Report Status 08/12/2019 FINAL  Final  Culture, blood (Routine x 2)     Status: None   Collection Time: 08/07/19  7:02 PM   Specimen: BLOOD  Result Value Ref Range Status   Specimen Description BLOOD RIGHT HAND  Final   Special Requests   Final    BOTTLES DRAWN AEROBIC AND ANAEROBIC Blood Culture results may not be optimal due to an inadequate volume of blood received in culture bottles   Culture   Final    NO GROWTH 5 DAYS Performed at [facility], [place] [address], [place], [place] [address]    Report Status 08/12/2019 FINAL  Final  SARS Coronavirus 2 by RT PCR (hospital order, performed in [facility] hospital lab) Nasopharyngeal Nasopharyngeal Swab     Status: None   Collection Time: 08/07/19  9:25 PM   Specimen: Nasopharyngeal Swab  Result Value Ref Range Status   SARS Coronavirus 2 NEGATIVE NEGATIVE Final    Comment: (NOTE) SARS-CoV-2 target nucleic acids are NOT DETECTED. The SARS-CoV-2 RNA is generally detectable in upper and lower respiratory specimens during the acute phase of infection. The lowest concentration of SARS-CoV-2 viral copies this assay can detect is 250 copies / mL. A negative result does not preclude SARS-CoV-2 infection and should not be used as the sole basis for treatment or other patient management decisions.  A negative result may occur with improper specimen collection / handling, submission of specimen other than nasopharyngeal swab, presence of viral mutation(s) within the areas targeted by this assay, and inadequate number of viral copies (<250 copies / mL). A negative result must be combined with clinical  observations, patient history, and epidemiological information. Fact Sheet for Patients:   StrictlyIdeas.no Fact Sheet for  Healthcare Providers: BankingDealers.co.za This test is not yet approved or cleared  by the [place] FDA and has been authorized for detection and/or diagnosis of SARS-CoV-2 by FDA under an Emergency Use Authorization (EUA).  This EUA will remain in effect (meaning this test can be used) for the duration of the COVID-19 declaration under Section 564(b)(1) of the Act, 21 U.S.C. section 360bbb-3(b)(1), unless the authorization is terminated or revoked sooner. Performed at [facility], [place] [address], [place], [place] [address]   Urine culture     Status: Abnormal   Collection Time: 08/08/19  5:36 AM   Specimen: In/Out Cath Urine  Result Value Ref Range Status   Specimen Description IN/OUT CATH URINE  Final   Special Requests   Final    NONE Performed at [facility], [place] [address], [place], [place] [address]    Culture 100 COLONIES/mL ENTEROBACTER AEROGENES (A)  Final   Report Status 08/10/2019 FINAL  Final   Organism ID, Bacteria ENTEROBACTER AEROGENES (A)  Final      Susceptibility   Enterobacter aerogenes - MIC*    CEFAZOLIN >=64 RESISTANT Resistant     CEFTRIAXONE <=1 SENSITIVE Sensitive     CIPROFLOXACIN <=0.25 SENSITIVE Sensitive     GENTAMICIN <=1 SENSITIVE Sensitive     IMIPENEM 2 SENSITIVE Sensitive     NITROFURANTOIN 64 INTERMEDIATE Intermediate     TRIMETH/SULFA <=20 SENSITIVE Sensitive     PIP/TAZO 8 SENSITIVE Sensitive     * 100 COLONIES/mL ENTEROBACTER AEROGENES  Aerobic/Anaerobic Culture (surgical/deep wound)     Status: None (Preliminary result)   Collection Time: 08/09/19  8:43 PM   Specimen: PATH Other; Tissue  Result Value Ref Range Status   Specimen Description FOOT RIGHT  Final   Special Requests NONE  Final   Gram Stain   Final    MODERATE WBC PRESENT,BOTH PMN AND MONONUCLEAR MODERATE GRAM POSITIVE COCCI IN PAIRS Performed at [facility], [address], [place], [place] [address]    Culture   Final     ABUNDANT STAPHYLOCOCCUS AUREUS NO ANAEROBES ISOLATED; CULTURE IN PROGRESS FOR 5 DAYS    Report Status PENDING  Incomplete   Organism ID, Bacteria STAPHYLOCOCCUS AUREUS  Final      Susceptibility   Staphylococcus aureus - MIC*    CIPROFLOXACIN <=0.5 SENSITIVE Sensitive     ERYTHROMYCIN <=0.25 SENSITIVE Sensitive     GENTAMICIN <=0.5 SENSITIVE Sensitive     OXACILLIN <=0.25 SENSITIVE Sensitive     TETRACYCLINE <=1 SENSITIVE Sensitive     VANCOMYCIN <=0.5 SENSITIVE Sensitive     TRIMETH/SULFA <=10 SENSITIVE Sensitive     CLINDAMYCIN <=0.25 SENSITIVE Sensitive     RIFAMPIN <=0.5 SENSITIVE Sensitive     Inducible Clindamycin NEGATIVE Sensitive     * ABUNDANT STAPHYLOCOCCUS AUREUS  Fungus Culture With Stain     Status: None (Preliminary result)   Collection Time: 08/09/19  8:43 PM  Result Value Ref Range Status   Fungus Stain Final report  Final    Comment: (NOTE) Performed At: [facility] [address] [place], [place] [id number] Rush Farmer MD JG:[id number]    Fungus (Mycology) Culture PENDING  Incomplete   Fungal Source FOOT  Final    Comment: RIGHT Performed at [facility], [place] [address] [place], [place] [address]   Fungus Culture Result     Status:  None   Collection Time: 08/09/19  8:43 PM  Result Value Ref Range Status   Result 1 Comment  Final    Comment: (NOTE) KOH/Calcofluor preparation:  no fungus observed. Performed At: [facility] [place], [place] [id number] Rush Farmer MD [id number]   Surgical pcr screen     Status: None   Collection Time: 08/14/19  1:02 AM   Specimen: Nasal Mucosa; Nasal Swab  Result Value Ref Range Status   MRSA, PCR NEGATIVE NEGATIVE Final   Staphylococcus aureus NEGATIVE NEGATIVE Final    Comment: (NOTE) The Xpert SA Assay (FDA approved for NASAL specimens in patients 25 years of age and older), is one component of a comprehensive surveillance program. It is not intended to diagnose  infection nor to guide or monitor treatment. Performed at [facility], [place] [address], [place], [place] [address]          Radiology Studies: No results found.      Scheduled Meds: . acetaminophen  1,000 mg Oral Once  . amiodarone  200 mg Oral BID  . apixaban  5 mg Oral BID  . atorvastatin  20 mg Oral Daily  . Chlorhexidine Gluconate Cloth  6 each Topical Once  . insulin aspart  0-9 Units Subcutaneous TID WC  . insulin aspart  12 Units Subcutaneous TID WC  . insulin glargine  51 Units Subcutaneous BID  . metoprolol tartrate  100 mg Oral BID   Continuous Infusions: . sodium chloride Stopped (08/10/19 2357)  .  ceFAZolin (ANCEF) IV 2 g (08/14/19 1224)  . lactated ringers Stopped (08/14/19 1224)     LOS: 6 days    Time spent: 25 minutes    Barb Merino, MD [facility] Pager [phone]

## 2019-08-14 NOTE — Anesthesia Procedure Notes (Signed)
Procedure Name: LMA Insertion Date/Time: 08/14/2019 6:18 PM Performed by: Inda Coke, CRNA Pre-anesthesia Checklist: Patient identified, Emergency Drugs available, Suction available and Patient being monitored Patient Re-evaluated:Patient Re-evaluated prior to induction Oxygen Delivery Method: Circle System Utilized Preoxygenation: Pre-oxygenation with 100% oxygen Induction Type: IV induction Ventilation: Mask ventilation without difficulty LMA: LMA inserted LMA Size: 5.0 Number of attempts: 1 Airway Equipment and Method: Bite block Placement Confirmation: positive ETCO2 Tube secured with: Tape Dental Injury: Teeth and Oropharynx as per pre-operative assessment

## 2019-08-14 NOTE — Transfer of Care (Signed)
Immediate Anesthesia Transfer of Care Note  Patient: Jeffery [name]  Procedure(s) Performed: DEBRIDEMENT WOUND (Right Foot)  Patient Location: PACU  Anesthesia Type:General  Level of Consciousness: awake, alert  and oriented  Airway & Oxygen Therapy: Patient Spontanous Breathing and Patient connected to nasal cannula oxygen  Post-op Assessment: Report given to RN and Post -op Vital signs reviewed and stable  Post vital signs: Reviewed and stable  Last Vitals:  Vitals Value Taken Time  BP 105/52 08/14/19 1912  Temp    Pulse 57 08/14/19 1915  Resp 17 08/14/19 1915  SpO2 97 % 08/14/19 1915  Vitals shown include unvalidated device data.  Last Pain:  Vitals:   08/14/19 1610  TempSrc: Oral  PainSc:       Patients Stated Pain Goal: 0 (67/59/16 3846)  Complications: No apparent anesthesia complications

## 2019-08-14 NOTE — Plan of Care (Signed)
  Problem: Health Behavior/Discharge Planning: Goal: Ability to manage health-related needs will improve Outcome: Progressing   Problem: Clinical Measurements: Goal: Diagnostic test results will improve Outcome: Progressing   

## 2019-08-14 NOTE — Anesthesia Preprocedure Evaluation (Addendum)
Anesthesia Evaluation  Patient identified by MRN, date of birth, ID band Patient awake    Reviewed: Allergy & Precautions, NPO status , Patient's Chart, lab work & pertinent test results, reviewed documented beta blocker date and time   Airway Mallampati: IV  TM Distance: >3 FB Neck ROM: Full  Mouth opening: Limited Mouth Opening  Dental  (+) Teeth Intact, Dental Advisory Given   Pulmonary neg pulmonary ROS, former smoker,    Pulmonary exam normal breath sounds clear to auscultation       Cardiovascular hypertension, Pt. on home beta blockers and Pt. on medications Normal cardiovascular exam+ dysrhythmias Atrial Fibrillation  Rhythm:Regular Rate:Normal  TTE 2020 1. Left ventricular ejection fraction, by visual estimation, is 55 to 60%. The left ventricle has normal function. There is moderately increased left ventricular hypertrophy.  2. Left ventricular diastolic function could not be evaluated.  3. Global right ventricle has mildly reduced systolic function.The right ventricular size is normal. No increase in right ventricular wall thickness.  4. Left atrial size was mildly dilated.  5. Right atrial size was moderately dilated.  6. The mitral valve is normal in structure. Mild mitral valve  regurgitation. No evidence of mitral stenosis.  7. The tricuspid valve is normal in structure. Tricuspid valve regurgitation is mild.  8. The aortic valve is tricuspid. Aortic valve regurgitation is not visualized. No evidence of aortic valve sclerosis or stenosis.  9. The pulmonic valve was normal in structure. Pulmonic valve regurgitation is not visualized.  10. Moderately elevated pulmonary artery systolic pressure.  11. The atrial septum is grossly normal.   Stress Test 2019 Normal perfusion No ischemia or scar LVEF is calculated at 48% Visual estimate suggests LVEF is greater Consider echo to confirm The study is  normal. There was no ST segment deviation noted during stress.   Neuro/Psych negative neurological ROS  negative psych ROS   GI/Hepatic negative GI ROS, Neg liver ROS,   Endo/Other  diabetes, Insulin DependentMorbid obesity (BMI 46)  Renal/GU Renal InsufficiencyRenal disease (Cr 1.83, K 4.5)  negative genitourinary   Musculoskeletal  (+) Arthritis ,   Abdominal   Peds  Hematology  (+) Blood dyscrasia (on eliquis), ,   Anesthesia Other Findings   Reproductive/Obstetrics                            Anesthesia Physical Anesthesia Plan  ASA: III  Anesthesia Plan: General   Post-op Pain Management:    Induction: Intravenous  PONV Risk Score and Plan: 2 and Ondansetron, Dexamethasone and Midazolam  Airway Management Planned: LMA  Additional Equipment:   Intra-op Plan:   Post-operative Plan: Extubation in OR  Informed Consent: I have reviewed the patients History and Physical, chart, labs and discussed the procedure including the risks, benefits and alternatives for the proposed anesthesia with the patient or authorized representative who has indicated his/her understanding and acceptance.     Dental advisory given  Plan Discussed with: CRNA  Anesthesia Plan Comments:         Anesthesia Quick Evaluation

## 2019-08-14 NOTE — Brief Op Note (Signed)
08/14/2019  7:44 PM  PATIENT:  [name]  59 y.o. male  PRE-OPERATIVE DIAGNOSIS:  Right foot ulcer  POST-OPERATIVE DIAGNOSIS:  Right foot ulcer  PROCEDURE:  Procedure(s): DEBRIDEMENT WOUND (Right)  SURGEON:  Surgeon(s) and Role:    * Trula Slade, DPM - Primary  PHYSICIAN ASSISTANT:   ASSISTANTS: none   ANESTHESIA:   General   EBL:  10 mL   BLOOD ADMINISTERED: 15 cc  DRAINS: none   LOCAL MEDICATIONS USED:  None   SPECIMEN:  None  DISPOSITION OF SPECIMEN:  none  COUNTS:  Correct  TOURNIQUET:  None   DICTATION: Sales executive   PLAN OF CARE: Admit to inpatient   PATIENT DISPOSITION: Admit to inpatient    Delay start of Pharmacological VTE agent (>24hrs) due to surgical blood loss or risk of bleeding:none  Intraoperative findings: Debrided and excised the ulcer. After debridement measured 5.5 x 5 x 1.1 cm. Debrided down to healthy tissue. One area of bone exposed after debridement but appeared hard and viable. Applied Acell graft.

## 2019-08-14 NOTE — Progress Notes (Signed)
Patient seen in pre-op. WBC improved and afebrile. Will plan for surgery as scheduled for wound debridement and likely ACell graft. He has no further questions or concerns. Surgical consent signed.   He did not have any family that he wanted me to talk to after surgery.  Celesta Gentile, DPM

## 2019-08-14 NOTE — Op Note (Signed)
PATIENT:  Jeffery [name]  59 y.o. [name]  PRE-OPERATIVE DIAGNOSIS:  Right foot ulcer  POST-OPERATIVE DIAGNOSIS:  Right foot ulcer  PROCEDURE:  Procedure(s): DEBRIDEMENT WOUND (Right)  SURGEON:  Surgeon(s) and Role:    * Trula Slade, DPM - Primary  PHYSICIAN ASSISTANT:   ASSISTANTS: none   ANESTHESIA:   General   EBL:  10 mL   BLOOD ADMINISTERED: 15 cc  DRAINS: none   LOCAL MEDICATIONS USED:  None   SPECIMEN:  None  DISPOSITION OF SPECIMEN:  none  COUNTS:  Correct  TOURNIQUET:  None   DICTATION: Sales executive   PLAN OF CARE: Admit to inpatient   PATIENT DISPOSITION: Admit to inpatient    Delay start of Pharmacological VTE agent (>24hrs) due to surgical blood loss or risk of bleeding:none   Indications for surgery:  59 year old [name] was admitted to the hospital for right foot, lower extremity infection, cellulitis and sepsis.  Underwent incision and drainage done significant purulence to the lateral aspect of the foot.  Remainder of antibiotics was also found temperature improved.  Recommend further debridement and excision of nonviable tissue with likely graft application.  We discussed the surgery as well as postoperative course.  Alternatives, risks, complications discussed.  No promises or guarantees as to the outcome of the procedure.  Procedure in detail: The patient was both verbally and visually identified by myself, nursing staff, the anesthesia staff in preop.Transferred the operating via hospital bed with the surgery was performed.  After LMA was placed by anesthesia the right lower extremity was then scrubbed, prepped, draped in normal sterile fashion.  Timeout was performed.  At this time I excised the area of nonviable tissue to the lateral aspect of the right foot.  I utilized a 15 scalpel to debride and excise the wound.  I debrided the wound edges to healthy, bleeding edges.  I then utilized Misonix were debrided in order  to debride nonviable tissue.  Debrided the wound down to bleeding, healthy tissue.  There was debridement down to one area metatarsal which appear to be viable.  It is hard in nature and white in color.  There is no purulence identified after debridement in the tissue appeared to be viable.  There is no tracking.  After debridement the wound the wound measured 5.5 x 5 x 1.1 cm.  Prior to debridement the wound was about 3 cm in length from incision and drainage.  Once the wound was debrided and hemostasis achieved.  I then utilized a Jeffery x 10 cm ACell crystal sheet and was trimmed the wound.  I used a total of approximately 6 x 6 cm of the graft.  I also utilized 1500 mg Micromatrix mixed in saline to make a paste. I utilized sorbact and sutured this in place with Vicryl as well.  I then placed petroleum jelly followed by dry sterile dressing over the area.  The wound on the right foot submetatarsal 1 is a stable with a granular base.  No surrounding erythema, drainage or pus or any signs of infection.  He was awoken anesthesia and found to tolerate the procedure well without any complications.  Vital signs stable and vascular status intact.  We will plan to leave the dressing until Thursday no change in the office.  Hopeful discharge tomorrow with oral antibiotics.  Continue elevation.  Continue IV antibiotics for now.

## 2019-08-14 NOTE — Progress Notes (Signed)
Hypoglycemic Event  CBG: 67  Treatment: D50 25 mL (12.5 gm)  Symptoms: None  Follow-up CBG: [id number] CBG Result:97  Possible Reasons for Event: Inadequate meal intake  Comments/MD notified:Dr Francene Castle, Richardo Hanks

## 2019-08-14 NOTE — Anesthesia Postprocedure Evaluation (Signed)
Anesthesia Post Note  Patient: Jeffery [name]  Procedure(s) Performed: DEBRIDEMENT WOUND (Right Foot)     Patient location during evaluation: PACU Anesthesia Type: General Level of consciousness: awake and alert Pain management: pain level controlled Vital Signs Assessment: post-procedure vital signs reviewed and stable Respiratory status: spontaneous breathing, nonlabored ventilation, respiratory function stable and patient connected to nasal cannula oxygen Cardiovascular status: blood pressure returned to baseline and stable Postop Assessment: no apparent nausea or vomiting Anesthetic complications: no    Last Vitals:  Vitals:   08/14/19 2000 08/14/19 2014  BP: (!) 142/65 (!) 142/99  Pulse: (!) 55 (!) 57  Resp: 18 18  Temp:  36.6 C  SpO2: 95% 96%    Last Pain:  Vitals:   08/14/19 2014  TempSrc: Oral  PainSc: [place]

## 2019-08-15 LAB — CBC WITH DIFFERENTIAL/PLATELET
Abs Immature Granulocytes: 0.17 10*3/uL — ABNORMAL HIGH (ref 0.00–0.07)
Basophils Absolute: 0 10*3/uL (ref 0.0–0.1)
Basophils Relative: 0 %
Eosinophils Absolute: 0 10*3/uL (ref 0.0–0.5)
Eosinophils Relative: 0 %
HCT: 38.2 % — ABNORMAL LOW (ref 39.0–52.0)
Hemoglobin: 11.8 g/dL — ABNORMAL LOW (ref 13.0–17.0)
Immature Granulocytes: 2 %
Lymphocytes Relative: 6 %
Lymphs Abs: 0.7 10*3/uL (ref 0.7–4.0)
MCH: 28.9 pg (ref 26.0–34.0)
MCHC: 30.9 g/dL (ref 30.0–36.0)
MCV: 93.6 fL (ref 80.0–100.0)
Monocytes Absolute: 0.3 10*3/uL (ref 0.1–1.0)
Monocytes Relative: 2 %
Neutro Abs: 10.6 10*3/uL — ABNORMAL HIGH (ref 1.7–7.7)
Neutrophils Relative %: 90 %
Platelets: 365 10*3/uL (ref 150–400)
RBC: 4.08 MIL/uL — ABNORMAL LOW (ref 4.22–5.81)
RDW: 14.6 % (ref 11.5–15.5)
WBC: 11.7 10*3/uL — ABNORMAL HIGH (ref 4.0–10.5)
nRBC: 0 % (ref 0.0–0.2)

## 2019-08-15 LAB — BASIC METABOLIC PANEL
Anion gap: 9 (ref 5–15)
Anion gap: 11 (ref 5–15)
BUN: 55 mg/dL — ABNORMAL HIGH (ref 6–20)
BUN: 53 mg/dL — ABNORMAL HIGH (ref 6–20)
CO2: 21 mmol/L — ABNORMAL LOW (ref 22–32)
CO2: 19 mmol/L — ABNORMAL LOW (ref 22–32)
Calcium: 8.6 mg/dL — ABNORMAL LOW (ref 8.9–10.3)
Calcium: 8.4 mg/dL — ABNORMAL LOW (ref 8.9–10.3)
Chloride: 102 mmol/L (ref 98–111)
Chloride: 101 mmol/L (ref 98–111)
Creatinine, Ser: 1.7 mg/dL — ABNORMAL HIGH (ref 0.61–1.24)
Creatinine, Ser: 1.65 mg/dL — ABNORMAL HIGH (ref 0.61–1.24)
GFR calc Af Amer: 51 mL/min — ABNORMAL LOW (ref >60)
GFR calc Af Amer: 53 mL/min — ABNORMAL LOW (ref >60)
GFR calc non Af Amer: 44 mL/min — ABNORMAL LOW (ref >60)
GFR calc non Af Amer: 45 mL/min — ABNORMAL LOW (ref >60)
Glucose, Bld: 354 mg/dL — ABNORMAL HIGH (ref 70–99)
Glucose, Bld: 275 mg/dL — ABNORMAL HIGH (ref 70–99)
Potassium: 6.1 mmol/L — ABNORMAL HIGH (ref 3.5–5.1)
Potassium: 5.3 mmol/L — ABNORMAL HIGH (ref 3.5–5.1)
Sodium: 132 mmol/L — ABNORMAL LOW (ref 135–145)
Sodium: 131 mmol/L — ABNORMAL LOW (ref 135–145)

## 2019-08-15 LAB — GLUCOSE, CAPILLARY
Glucose-Capillary: 282 mg/dL — ABNORMAL HIGH (ref 70–99)
Glucose-Capillary: 308 mg/dL — ABNORMAL HIGH (ref 70–99)
Glucose-Capillary: 256 mg/dL — ABNORMAL HIGH (ref 70–99)
Glucose-Capillary: 313 mg/dL — ABNORMAL HIGH (ref 70–99)

## 2019-08-15 LAB — AEROBIC/ANAEROBIC CULTURE W GRAM STAIN (SURGICAL/DEEP WOUND)

## 2019-08-15 MED ORDER — INSULIN ASPART 100 UNIT/ML ~~LOC~~ SOLN
4.0000 [IU] | Freq: Once | SUBCUTANEOUS | Status: AC
  Administered 2019-08-15: 4 [IU] via SUBCUTANEOUS

## 2019-08-15 MED ORDER — CEPHALEXIN 500 MG PO CAPS
500.0000 mg | ORAL_CAPSULE | Freq: Three times a day (TID) | ORAL | Status: DC
  Administered 2019-08-15 – 2019-08-16 (×4): 500 mg via ORAL
  Filled 2019-08-15 (×4): qty 1

## 2019-08-15 MED ORDER — AMLODIPINE BESYLATE 5 MG PO TABS
5.0000 mg | ORAL_TABLET | Freq: Every day | ORAL | Status: DC
  Administered 2019-08-16: 5 mg via ORAL
  Filled 2019-08-15: qty 1

## 2019-08-15 MED ORDER — INSULIN GLARGINE 100 UNIT/ML ~~LOC~~ SOLN
40.0000 [IU] | Freq: Two times a day (BID) | SUBCUTANEOUS | Status: DC
  Administered 2019-08-15 – 2019-08-16 (×2): 40 [IU] via SUBCUTANEOUS
  Filled 2019-08-15 (×3): qty 0.4

## 2019-08-15 NOTE — Progress Notes (Signed)
Inpatient Diabetes Program Recommendations  AACE/ADA: New Consensus Statement on Inpatient Glycemic Control (2015)  Target Ranges:  Prepandial:   less than 140 mg/dL      Peak postprandial:   less than 180 mg/dL (1-2 hours)      Critically ill patients:  140 - 180 mg/dL   Lab Results  Component Value Date   GLUCAP 308 (H) 08/15/2019   HGBA1C 7.1 (H) 08/08/2019    Review of Glycemic Control Results for [name], Jeffery [name] (MRN [id number]) as of 08/15/2019 12:19  Ref. Range 08/14/2019 19:13 08/14/2019 19:35 08/14/2019 20:24 08/15/2019 06:36 08/15/2019 11:11  Glucose-Capillary Latest Ref Range: 70 - 99 mg/dL 67 (L) 97 82 282 (H) 308 (H)   Diabetes history: Type 2 DM Outpatient Diabetes medications: Lantus 51 units BID, Novolog 8 units TID, Victoza 1.8 mg QD Current orders for Inpatient glycemic control: Novolog 0-9 units TID, Novolog 8 units TID, Lantus 30 units BID  Inpatient Diabetes Program Recommendations:    Consider increasing Lantus 40 units BID.   Thanks, Bronson Curb, MSN, RNC-OB Diabetes Coordinator [phone] (8a-5p)

## 2019-08-15 NOTE — Progress Notes (Signed)
PROGRESS NOTE    Jeffery [name]  KTG:[id number] DOB: Jan 10, 1961 DOA: 08/07/2019 PCP: [name], MD    Brief Narrative:  59 year old gentleman with history of type 2 diabetes on insulin, hypertension, hyperlipidemia, paroxysmal atrial fibrillation status post cardioversion currently on Eliquis presented to the emergency department with persistent right foot ulcer and pain and swelling.  In the emergency room, found to have significant swelling, suspected osteomyelitis and admitted with IV antibiotics. Surgical debridement, 5/14 Repeat debridement and skin grafting, 5/17 Wound culture with MSSA.  Assessment & Plan:   Principal Problem:   Severe sepsis with acute organ dysfunction due to methicillin susceptible Staphylococcus aureus (MSSA) (HCC) Active Problems:   Essential hypertension   Uncontrolled type 2 diabetes mellitus with diabetic polyneuropathy, without long-term current use of insulin (HCC)   Paroxysmal atrial fibrillation (HCC)   Right foot ulcer, with fat layer exposed ([place])   Cellulitis of right lower extremity   Mixed diabetic hyperlipidemia associated with type 2 diabetes mellitus ([place])   AKI (acute kidney injury) ([place])   Sepsis due to methicillin susceptible Staphylococcus aureus (MSSA) with acute renal failure ([place])  Sepsis present on admission due to localized foot infection with acute renal failure: MSSA soft tissue infection. Clinically improving after surgical incision and drainage.  WBC trending down.  Initially treated with vancomycin and currently on Ancef.  Will change to oral cephalosporin for 2 weeks. Patient has some clinical improvement. Underwent debridement and skin graft placement by podiatry on 5/17 Surgery plan to examine his wound on 5/20 in the office.   Acute kidney injury with hyperkalemia: Patient has fluctuating renal functions.  He was on ARB that is on hold since admission.  Now he is developing hyperkalemia with increasing BUN and  creatinine.  Continues to be on maintenance IV fluids.  Urine output is adequate. Called and discussed with nephrology, consultation will be done.  Hypertension: Blood pressures fairly stable.  Continue beta-blockers.  Holding other antihypertensives.  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  A1c 7.1.  Continue home insulin doses.  Hypoglycemic episodes yesterday when he was prolonged n.p.o. Stabilized and insulin doses reduced.  Currently stable.  Paroxysmal atrial fibrillation: Currently sinus rhythm on metoprolol and Eliquis that he will continue.   DVT prophylaxis: Eliquis Code Status: Full code Family Communication: None Disposition Plan: Status is: Inpatient  Remains inpatient appropriate because:IV treatments appropriate due to intensity of illness or inability to take PO   Dispo: The patient is from: Home              Anticipated d/c is to: Home              Anticipated d/c date is: 1 day.              Patient currently is not medically stable to d/c.  Is surgical issue is a stabilizing, however he has developed acute renal failure with hyperkalemia that needs IV fluids, inpatient monitoring and nephrology consultation.  Consultants:   Podiatry  Nephrology  Procedures:   Debridement  Debridement and skin grafting  Antimicrobials:  Anti-infectives (From admission, onward)   Start     Dose/Rate Route Frequency Ordered Stop   08/15/19 1400  cephALEXin (KEFLEX) capsule 500 mg     500 mg Oral Every 8 hours 08/15/19 1150 08/22/19 1359   08/12/19 1200  ceFAZolin (ANCEF) IVPB 2g/100 mL premix  Status:  Discontinued     2 g 200 mL/hr over 30 Minutes Intravenous  Every 8 hours 08/12/19 1115 08/15/19 1150   08/10/19 2200  vancomycin (VANCOREADY) IVPB 1250 mg/250 mL  Status:  Discontinued     1,250 mg 166.7 mL/hr over 90 Minutes Intravenous Every 12 hours 08/10/19 1541 08/12/19 1054   08/09/19 1000  vancomycin (VANCOREADY) IVPB  1500 mg/300 mL  Status:  Discontinued     1,500 mg 150 mL/hr over 120 Minutes Intravenous Every 24 hours 08/08/19 0909 08/10/19 1541   08/08/19 0800  vancomycin (VANCOREADY) IVPB 2000 mg/400 mL  Status:  Discontinued     2,000 mg 200 mL/hr over 120 Minutes Intravenous Every 24 hours 08/08/19 0530 08/08/19 0909   08/07/19 2130  vancomycin (VANCOCIN) IVPB 1000 mg/200 mL premix     1,000 mg 200 mL/hr over 60 Minutes Intravenous  Once 08/07/19 2120 08/07/19 2312         Subjective: Seen and examined.  Perioperative hypoglycemic events noted now improved. He is expecting to go home sometimes today.  We discussed about abnormal kidney test and nephrology consultation.  Objective: Vitals:   08/14/19 2014 08/15/19 0029 08/15/19 0411 08/15/19 0853  BP: (!) 142/99 (!) 157/84 (!) 177/80 (!) 148/67  Pulse: (!) 57 (!) 56 (!) 57 (!) 56  Resp: 18 20 18 18   Temp: 97.9 F (36.6 C) 98.1 F (36.7 C) (!) 97.4 F (36.3 C) 98.3 F (36.8 C)  TempSrc: Oral Oral Oral Oral  SpO2: 96% 96% 96% 98%  Weight:  (!) 168.6 kg    Height:        Intake/Output Summary (Last 24 hours) at 08/15/2019 1150 Last data filed at 08/15/2019 1125 Gross per 24 hour  Intake 3044.69 ml  Output 1860 ml  Net 1184.69 ml   Filed Weights   08/12/19 2107 08/14/19 1644 08/15/19 0029  Weight: (!) 162.9 kg (!) 162.9 kg (!) 168.6 kg    Examination:  General exam: Appears calm and comfortable, on room air. Respiratory system: Clear to auscultation. Respiratory effort normal. Cardiovascular system: S1 & S2 heard, RRR.  Gastrointestinal system: Abdomen is nondistended, soft and nontender.  Obese and pendulous  Central nervous system: Alert and oriented. No focal neurological deficits. Extremities: Symmetric 5 x 5 power. Skin: Right lower leg and ankle swelling.  Swelling and tenderness receding from the upper leg. Immediate postop surgical dressing, not removed by me.  Surgical dressing removal is planned in the office in 2  days.   Data Reviewed: I have personally reviewed following labs and imaging studies  CBC: Recent Labs  Lab 08/11/19 0538 08/12/19 0416 08/13/19 0333 08/14/19 0506 08/15/19 0450  WBC 21.5* 12.9* 13.2* 12.0* 11.7*  NEUTROABS 19.0* 9.7* 8.6* 7.7 10.6*  HGB 11.4* 11.3* 12.8* 11.3* 11.8*  HCT 36.7* 36.2* 42.1 36.7* 38.2*  MCV 93.4 93.1 95.7 94.6 93.6  PLT 380 375 457* 352 960   Basic Metabolic Panel: Recent Labs  Lab 08/09/19 1010 08/10/19 0517 08/11/19 0538 08/12/19 0416 08/13/19 0333 08/14/19 0506 08/15/19 0450  NA 133*   < > 139 137 138 133* 132*  K 4.1   < > 4.9 4.5 4.5 4.5 6.1*  CL 102   < > 104 108 105 104 102  CO2 19*   < > 26 23 24  21* 21*  GLUCOSE 234*   < > 128* 201* 153* 158* 354*  BUN 28*   < > 26* 33* 43* 54* 55*  CREATININE 1.43*   < > 1.10 1.26* 1.84* 1.83* 1.70*  CALCIUM 8.7*   < > 9.1 8.5*  8.9 8.2* 8.6*  MG 2.2  --   --   --   --   --   --   PHOS 3.4  --   --   --   --   --   --    < > = values in this interval not displayed.   GFR: Estimated Creatinine Clearance: 79.2 mL/min (A) (by C-G formula based on SCr of 1.7 mg/dL (H)). Liver Function Tests: No results for input(s): AST, ALT, ALKPHOS, BILITOT, PROT, ALBUMIN in the last 168 hours. No results for input(s): LIPASE, AMYLASE in the last 168 hours. No results for input(s): AMMONIA in the last 168 hours. Coagulation Profile: No results for input(s): INR, PROTIME in the last 168 hours. Cardiac Enzymes: No results for input(s): CKTOTAL, CKMB, CKMBINDEX, TROPONINI in the last 168 hours. BNP (last 3 results) No results for input(s): PROBNP in the last 8760 hours. HbA1C: No results for input(s): HGBA1C in the last 72 hours. CBG: Recent Labs  Lab 08/14/19 1913 08/14/19 1935 08/14/19 2024 08/15/19 0636 08/15/19 1111  GLUCAP 67* 97 82 282* 308*   Lipid Profile: No results for input(s): CHOL, HDL, LDLCALC, TRIG, CHOLHDL, LDLDIRECT in the last 72 hours. Thyroid Function Tests: No results for  input(s): TSH, T4TOTAL, FREET4, T3FREE, THYROIDAB in the last 72 hours. Anemia Panel: No results for input(s): VITAMINB12, FOLATE, FERRITIN, TIBC, IRON, RETICCTPCT in the last 72 hours. Sepsis Labs: No results for input(s): PROCALCITON, LATICACIDVEN in the last 168 hours.  Recent Results (from the past 240 hour(s))  Culture, blood (Routine x 2)     Status: None   Collection Time: 08/07/19  7:00 PM   Specimen: BLOOD  Result Value Ref Range Status   Specimen Description BLOOD LEFT ARM  Final   Special Requests   Final    BOTTLES DRAWN AEROBIC AND ANAEROBIC Blood Culture results may not be optimal due to an inadequate volume of blood received in culture bottles   Culture   Final    NO GROWTH 5 DAYS Performed at [facility], [place] [address] [place], [place] [address]    Report Status 08/12/2019 FINAL  Final  Culture, blood (Routine x 2)     Status: None   Collection Time: 08/07/19  7:02 PM   Specimen: BLOOD  Result Value Ref Range Status   Specimen Description BLOOD RIGHT HAND  Final   Special Requests   Final    BOTTLES DRAWN AEROBIC AND ANAEROBIC Blood Culture results may not be optimal due to an inadequate volume of blood received in culture bottles   Culture   Final    NO GROWTH 5 DAYS Performed at [facility], [place] [address] [place], [place] [address]    Report Status 08/12/2019 FINAL  Final  SARS Coronavirus 2 by RT PCR (hospital order, performed in [facility] hospital lab) Nasopharyngeal Nasopharyngeal Swab     Status: None   Collection Time: 08/07/19  9:25 PM   Specimen: Nasopharyngeal Swab  Result Value Ref Range Status   SARS Coronavirus 2 NEGATIVE NEGATIVE Final    Comment: (NOTE) SARS-CoV-2 target nucleic acids are NOT DETECTED. The SARS-CoV-2 RNA is generally detectable in upper and lower respiratory specimens during the acute phase of infection. The lowest concentration of SARS-CoV-2 viral copies this assay can detect is 250 copies / mL. A negative  result does not preclude SARS-CoV-2 infection and should not be used as the sole basis for treatment or other patient management decisions.  A negative result may occur with improper specimen collection / handling, submission of specimen other than nasopharyngeal swab, presence of viral mutation(s) within the areas targeted by this assay, and inadequate number of viral copies (<250 copies / mL). A negative result must be combined with clinical observations, patient history, and epidemiological information. Fact Sheet for Patients:   StrictlyIdeas.no Fact Sheet for Healthcare Providers: BankingDealers.co.za This test is not yet approved or cleared  by the [place] FDA and has been authorized for detection and/or diagnosis of SARS-CoV-2 by FDA under an Emergency Use Authorization (EUA).  This EUA will remain in effect (meaning this test can be used) for the duration of the COVID-19 declaration under Section 564(b)(1) of the Act, 21 U.S.C. section 360bbb-3(b)(1), unless the authorization is terminated or revoked sooner. Performed at [facility], [place] [address], [place], [place] [address]   Urine culture     Status: Abnormal   Collection Time: 08/08/19  5:36 AM   Specimen: In/Out Cath Urine  Result Value Ref Range Status   Specimen Description IN/OUT CATH URINE  Final   Special Requests   Final    NONE Performed at [facility], [place] [address] [place], [place] [address]    Culture 100 COLONIES/mL ENTEROBACTER AEROGENES (A)  Final   Report Status 08/10/2019 FINAL  Final   Organism ID, Bacteria ENTEROBACTER AEROGENES (A)  Final      Susceptibility   Enterobacter aerogenes - MIC*    CEFAZOLIN >=64 RESISTANT Resistant     CEFTRIAXONE <=1 SENSITIVE Sensitive     CIPROFLOXACIN <=0.25 SENSITIVE Sensitive     GENTAMICIN <=1 SENSITIVE Sensitive     IMIPENEM 2 SENSITIVE Sensitive     NITROFURANTOIN 64 INTERMEDIATE  Intermediate     TRIMETH/SULFA <=20 SENSITIVE Sensitive     PIP/TAZO 8 SENSITIVE Sensitive     * 100 COLONIES/mL ENTEROBACTER AEROGENES  Aerobic/Anaerobic Culture (surgical/deep wound)     Status: None   Collection Time: 08/09/19  8:43 PM   Specimen: PATH Other; Tissue  Result Value Ref Range Status   Specimen Description FOOT RIGHT  Final   Special Requests NONE  Final   Gram Stain   Final    MODERATE WBC PRESENT,BOTH PMN AND MONONUCLEAR MODERATE GRAM POSITIVE COCCI IN PAIRS    Culture   Final    ABUNDANT STAPHYLOCOCCUS AUREUS NO ANAEROBES ISOLATED Performed at [facility], [address], [place], [place] [address]    Report Status 08/15/2019 FINAL  Final   Organism ID, Bacteria STAPHYLOCOCCUS AUREUS  Final      Susceptibility   Staphylococcus aureus - MIC*    CIPROFLOXACIN <=0.5 SENSITIVE Sensitive     ERYTHROMYCIN <=0.25 SENSITIVE Sensitive     GENTAMICIN <=0.5 SENSITIVE Sensitive     OXACILLIN <=0.25 SENSITIVE Sensitive     TETRACYCLINE <=1 SENSITIVE Sensitive     VANCOMYCIN <=0.5 SENSITIVE Sensitive     TRIMETH/SULFA <=10 SENSITIVE Sensitive     CLINDAMYCIN <=0.25 SENSITIVE Sensitive     RIFAMPIN <=0.5 SENSITIVE Sensitive     Inducible Clindamycin NEGATIVE Sensitive     * ABUNDANT STAPHYLOCOCCUS AUREUS  Fungus Culture With Stain     Status: None (Preliminary result)   Collection Time: 08/09/19  8:43 PM  Result Value Ref Range Status   Fungus Stain Final report  Final    Comment: (NOTE) Performed At: [facility] [address] [place], [place] [id number] Rush Farmer MD [id number]    Fungus (Mycology)  Culture PENDING  Incomplete   Fungal Source FOOT  Final    Comment: RIGHT Performed at [facility], [place] [address], [place], [place] [address]   Fungus Culture Result     Status: None   Collection Time: 08/09/19  8:43 PM  Result Value Ref Range Status   Result 1 Comment  Final    Comment: (NOTE) KOH/Calcofluor preparation:  no fungus  observed. Performed At: [facility] [place], [place] [id number] Rush Farmer MD [id number]   Surgical pcr screen     Status: None   Collection Time: 08/14/19  1:02 AM   Specimen: Nasal Mucosa; Nasal Swab  Result Value Ref Range Status   MRSA, PCR NEGATIVE NEGATIVE Final   Staphylococcus aureus NEGATIVE NEGATIVE Final    Comment: (NOTE) The Xpert SA Assay (FDA approved for NASAL specimens in patients 32 years of age and older), is one component of a comprehensive surveillance program. It is not intended to diagnose infection nor to guide or monitor treatment. Performed at [facility], [place] [address], [place], [place] [address]          Radiology Studies: No results found.      Scheduled Meds: . acetaminophen  1,000 mg Oral Once  . amiodarone  200 mg Oral BID  . apixaban  5 mg Oral BID  . atorvastatin  20 mg Oral Daily  . cephALEXin  500 mg Oral Q8H  . insulin aspart  0-9 Units Subcutaneous TID WC  . insulin aspart  8 Units Subcutaneous TID WC  . insulin glargine  30 Units Subcutaneous BID  . metoprolol tartrate  100 mg Oral BID   Continuous Infusions: . sodium chloride Stopped (08/10/19 2357)  . lactated ringers Stopped (08/15/19 1124)     LOS: 7 days    Time spent: 25 minutes    Barb Merino, MD [facility] Pager [phone]

## 2019-08-15 NOTE — Progress Notes (Signed)
Pharmacy Antibiotic Note  Jeffery [name] is a 59 y.o. male admitted on 08/07/2019 with cellulitis.  Pharmacy consulted on 5/11 for vancomycin dosing.   Foot culture has grown out MSSA. Pt returned to the OR on Monday 5/17 for further I&D. Overall, the infection is improving per MD. WBC down to 11.7 today. Possible discharge today per Podiatry on PO abx.   Scr 1.70  Plan: Continue Cefazolin 2g IV q8 Would suggest cephalexin for PO de-escalation.   Height: 6' 2.02" (188 cm) Weight: (!) 168.6 kg (371 lb 11.2 oz) IBW/kg (Calculated) : 82.24  Temp (24hrs), Avg:97.7 F (36.5 C), Min:97 F (36.1 C), Max:98.1 F (36.7 C)  Recent Labs  Lab 08/11/19 0538 08/12/19 0416 08/13/19 0333 08/14/19 0506 08/15/19 0450  WBC 21.5* 12.9* 13.2* 12.0* 11.7*  CREATININE 1.10 1.26* 1.84* 1.83* 1.70*    Estimated Creatinine Clearance: 79.2 mL/min (A) (by C-G formula based on SCr of 1.7 mg/dL (H)).    Allergies  Allergen Reactions  . Metformin And Related Other (See Comments)    GI upset    Antimicrobials this admission: Vanc 5/10>>5/15 Cefazolin 5/15>>  Dose adjustments this admission:   Microbiology results: 5/10 BCX: ngtd x 3d 5/11 Ucx: 100 colonies of enterobacter - not treating.  No symptoms 5/10 Covid: neg 5/12: foot abscess >>MSSA  Jeffery [name] A. Levada Dy, PharmD, BCPS, FNKF Clinical Pharmacist [place] Please utilize Amion for appropriate phone number to reach the unit pharmacist ([place])   08/15/2019 7:28 AM

## 2019-08-15 NOTE — Consult Note (Signed)
Reason for Consult: Hyperkalemia and renal failure Referring Physician:  Dr. Sloan Leiter  Chief Complaint: Rt foot pain  Assessment/Plan: 1. Renal failure with associated hyperkalemia - acute component likely associated with Vancomycin + infection but fortunately his renal function is starting to improve. I don't have a great explanation for the hyperkalemia as I would expect the improvement in renal function not to elevate the potassium - Would certainly repeat BMet before ordering studies such as TTKG and 24hr for K. If high then will give a dose of Veltassa. The other possibility is he had a hypoglycemic episodes req glucose and now is actually hyperglycemic. When insulin admistration is corrected the K will likely be driven intracellularly. - Will also d/c the LR (small amount of K).  2. HTN - continue anti hypertensives; may need to add the other meds on his home regimen. 3. DM 4. pAfib 5. Sepsis s/p I&D + debridement   HPI: Jeffery [name] is an 59 y.o. male DM HTN HLD pAfib s/p CV 05/2019 presenting to ED because of persistent right foot ulcer and pain. He has had an ulceration in the rt foot for 3 mths but it has become larger and more painful especially with weight bearing. He also had weakness and anorexia + fevers to 102. In the ED he was found to have a fever >102, leukocytosis and AKI.  Foot culture (5/12)  grew out MSSA and Vancomycin (5/11-5/14)  was transitioned to Cefazolin.  He had the foot wound I&D by Dr. Jacqualyn Posey on 5/12 and then a debridement  on 5/17 w/ purulence noted from the lateral aspect of the food, debrided to healthy tissue with no tracking noted.  His BL Cr is 0.98 and Cr had risen and peaked in the  1.8 range. He doesn't use any NSAID's and denies any obstructive symptoms or dysuria.   ROS Pertinent items are noted in HPI.  Chemistry and CBC: Creat  Date/Time Value Ref Range Status  05/14/2016 04:41 PM 0.80 0.70 - 1.33 mg/dL Final    Comment:      For patients  > or = 58 years of age: The upper reference limit for Creatinine is approximately 13% higher for people identified as African-American.     11/12/2014 03:37 PM 0.85 0.70 - 1.33 mg/dL Final   Creatinine, Ser  Date/Time Value Ref Range Status  08/15/2019 04:50 AM 1.70 (H) 0.61 - 1.24 mg/dL Final  08/14/2019 05:06 AM 1.83 (H) 0.61 - 1.24 mg/dL Final  08/13/2019 03:33 AM 1.84 (H) 0.61 - 1.24 mg/dL Final  08/12/2019 04:16 AM 1.26 (H) 0.61 - 1.24 mg/dL Final  08/11/2019 05:38 AM 1.10 0.61 - 1.24 mg/dL Final  08/10/2019 05:17 AM 1.38 (H) 0.61 - 1.24 mg/dL Final  08/09/2019 10:10 AM 1.43 (H) 0.61 - 1.24 mg/dL Final  08/08/2019 03:54 AM 2.31 (H) 0.61 - 1.24 mg/dL Final  08/07/2019 07:00 PM 1.46 (H) 0.61 - 1.24 mg/dL Final  05/18/2019 09:54 AM 0.98 0.61 - 1.24 mg/dL Final  04/21/2019 12:34 PM 0.94 0.76 - 1.27 mg/dL Final  02/13/2019 04:11 PM 0.96 0.76 - 1.27 mg/dL Final  09/21/2018 01:41 PM 1.03 0.76 - 1.27 mg/dL Final  10/24/2017 05:25 PM 1.13 0.61 - 1.24 mg/dL Final  04/15/2017 10:22 AM CANCELED      Comment:    Test not performed  Result canceled by the ancillary.   03/26/2017 10:47 AM 0.73 0.61 - 1.24 mg/dL Final  11/04/2016 08:33 AM 0.80 0.76 - 1.27 mg/dL Final  11/01/2016 07:59 AM 0.85  0.61 - 1.24 mg/dL Final  08/04/2016 02:37 PM 0.89 0.61 - 1.24 mg/dL Final  08/11/2015 04:44 PM 0.99 0.61 - 1.24 mg/dL Final  03/29/2015 06:50 AM 0.83 0.61 - 1.24 mg/dL Final  03/18/2015 10:53 AM 0.79 0.61 - 1.24 mg/dL Final  10/07/2014 05:04 AM 0.68 0.61 - 1.24 mg/dL Final  10/04/2014 03:39 AM 0.59 (L) 0.61 - 1.24 mg/dL Final  10/03/2014 06:12 AM 0.63 0.61 - 1.24 mg/dL Final  10/02/2014 09:38 AM 0.60 (L) 0.61 - 1.24 mg/dL Final  10/01/2014 08:56 PM 0.71 0.61 - 1.24 mg/dL Final  10/01/2014 09:56 AM 0.69 0.61 - 1.24 mg/dL Final  04/13/2010 04:56 AM 0.86 0.4 - 1.5 mg/dL Final  04/11/2010 04:45 AM 0.72 0.4 - 1.5 mg/dL Final  04/10/2010 05:00 AM 0.84 0.4 - 1.5 mg/dL Final  04/09/2010 04:49 PM 0.9  0.4 - 1.5 mg/dL Final   Recent Labs  Lab 08/09/19 1010 08/10/19 0517 08/11/19 0538 08/12/19 0416 08/13/19 0333 08/14/19 0506 08/15/19 0450  NA 133* 134* 139 137 138 133* 132*  K 4.1 5.3* 4.9 4.5 4.5 4.5 6.1*  CL 102 105 104 108 105 104 102  CO2 19* 19* 26 23 24  21* 21*  GLUCOSE 234* 347* 128* 201* 153* 158* 354*  BUN 28* 27* 26* 33* 43* 54* 55*  CREATININE 1.43* 1.38* 1.10 1.26* 1.84* 1.83* 1.70*  CALCIUM 8.7* 8.9 9.1 8.5* 8.9 8.2* 8.6*  PHOS 3.4  --   --   --   --   --   --    Recent Labs  Lab 08/12/19 0416 08/13/19 0333 08/14/19 0506 08/15/19 0450  WBC 12.9* 13.2* 12.0* 11.7*  NEUTROABS 9.7* 8.6* 7.7 10.6*  HGB 11.3* 12.8* 11.3* 11.8*  HCT 36.2* 42.1 36.7* 38.2*  MCV 93.1 95.7 94.6 93.6  PLT 375 457* 352 365   Liver Function Tests: No results for input(s): AST, ALT, ALKPHOS, BILITOT, PROT, ALBUMIN in the last 168 hours. No results for input(s): LIPASE, AMYLASE in the last 168 hours. No results for input(s): AMMONIA in the last 168 hours. Cardiac Enzymes: No results for input(s): CKTOTAL, CKMB, CKMBINDEX, TROPONINI in the last 168 hours. Iron Studies: No results for input(s): IRON, TIBC, TRANSFERRIN, FERRITIN in the last 72 hours. PT/INR: @LABRCNTIP (inr:5)  Xrays/Other Studies: ) Results for orders placed or performed during the hospital encounter of 08/07/19 (from the past 48 hour(s))  Glucose, capillary     Status: Abnormal   Collection Time: 08/13/19 11:45 AM  Result Value Ref Range   Glucose-Capillary 118 (H) 70 - 99 mg/dL    Comment: Glucose reference range applies only to samples taken after fasting for at least 8 hours.  Glucose, capillary     Status: Abnormal   Collection Time: 08/13/19  4:44 PM  Result Value Ref Range   Glucose-Capillary 131 (H) 70 - 99 mg/dL    Comment: Glucose reference range applies only to samples taken after fasting for at least 8 hours.  Glucose, capillary     Status: None   Collection Time: 08/13/19  9:22 PM  Result Value  Ref Range   Glucose-Capillary 91 70 - 99 mg/dL    Comment: Glucose reference range applies only to samples taken after fasting for at least 8 hours.  Surgical pcr screen     Status: None   Collection Time: 08/14/19  1:02 AM   Specimen: Nasal Mucosa; Nasal Swab  Result Value Ref Range   MRSA, PCR NEGATIVE NEGATIVE   Staphylococcus aureus NEGATIVE NEGATIVE    Comment: (  NOTE) The Xpert SA Assay (FDA approved for NASAL specimens in patients 19 years of age and older), is one component of a comprehensive surveillance program. It is not intended to diagnose infection nor to guide or monitor treatment. Performed at [facility], [place] [address] [place], [place] [address]   CBC with Differential/Platelet     Status: Abnormal   Collection Time: 08/14/19  5:06 AM  Result Value Ref Range   WBC 12.0 (H) 4.0 - 10.5 K/uL   RBC 3.88 (L) 4.22 - 5.81 MIL/uL   Hemoglobin 11.3 (L) 13.0 - 17.0 g/dL   HCT 36.7 (L) 39.0 - 52.0 %   MCV 94.6 80.0 - 100.0 fL   MCH 29.1 26.0 - 34.0 pg   MCHC 30.8 30.0 - 36.0 g/dL   RDW 14.9 11.5 - 15.5 %   Platelets 352 150 - 400 K/uL   nRBC 0.0 0.0 - 0.2 %   Neutrophils Relative % 64 %   Neutro Abs 7.7 1.7 - 7.7 K/uL   Lymphocytes Relative 20 %   Lymphs Abs 2.4 0.7 - 4.0 K/uL   Monocytes Relative 10 %   Monocytes Absolute 1.2 (H) 0.1 - 1.0 K/uL   Eosinophils Relative 3 %   Eosinophils Absolute 0.4 0.0 - 0.5 K/uL   Basophils Relative 0 %   Basophils Absolute 0.0 0.0 - 0.1 K/uL   Immature Granulocytes 3 %   Abs Immature Granulocytes 0.38 (H) 0.00 - 0.07 K/uL    Comment: Performed at [facility], [address], [place],  [address]  Basic metabolic panel     Status: Abnormal   Collection Time: 08/14/19  5:06 AM  Result Value Ref Range   Sodium 133 (L) 135 - 145 mmol/L   Potassium 4.5 3.5 - 5.1 mmol/L   Chloride 104 98 - 111 mmol/L   CO2 21 (L) 22 - 32 mmol/L   Glucose, Bld 158 (H) 70 - 99 mg/dL    Comment: Glucose reference range applies  only to samples taken after fasting for at least 8 hours.   BUN 54 (H) 6 - 20 mg/dL   Creatinine, Ser 1.83 (H) 0.61 - 1.24 mg/dL   Calcium 8.2 (L) 8.9 - 10.3 mg/dL   GFR calc non Af Amer 40 (L) >60 mL/min   GFR calc Af Amer 46 (L) >60 mL/min   Anion gap 8 5 - 15    Comment: Performed at [facility] [address] [place], [place] [address]  Glucose, capillary     Status: Abnormal   Collection Time: 08/14/19  6:49 AM  Result Value Ref Range   Glucose-Capillary 112 (H) 70 - 99 mg/dL    Comment: Glucose reference range applies only to samples taken after fasting for at least 8 hours.  Glucose, capillary     Status: Abnormal   Collection Time: 08/14/19 11:20 AM  Result Value Ref Range   Glucose-Capillary 176 (H) 70 - 99 mg/dL    Comment: Glucose reference range applies only to samples taken after fasting for at least 8 hours.  Glucose, capillary     Status: Abnormal   Collection Time: 08/14/19  3:59 PM  Result Value Ref Range   Glucose-Capillary 61 (L) 70 - 99 mg/dL    Comment: Glucose reference range applies only to samples taken after fasting for at least 8 hours.  Glucose, capillary     Status: None   Collection Time: 08/14/19  4:20 PM  Result  Value Ref Range   Glucose-Capillary 78 70 - 99 mg/dL    Comment: Glucose reference range applies only to samples taken after fasting for at least 8 hours.  Glucose, capillary     Status: None   Collection Time: 08/14/19  5:31 PM  Result Value Ref Range   Glucose-Capillary 82 70 - 99 mg/dL    Comment: Glucose reference range applies only to samples taken after fasting for at least 8 hours.  Glucose, capillary     Status: Abnormal   Collection Time: 08/14/19  7:13 PM  Result Value Ref Range   Glucose-Capillary 67 (L) 70 - 99 mg/dL    Comment: Glucose reference range applies only to samples taken after fasting for at least 8 hours.   Comment 1 Notify RN   Glucose, capillary     Status: None   Collection Time: 08/14/19  7:35 PM   Result Value Ref Range   Glucose-Capillary 97 70 - 99 mg/dL    Comment: Glucose reference range applies only to samples taken after fasting for at least 8 hours.  Glucose, capillary     Status: None   Collection Time: 08/14/19  8:24 PM  Result Value Ref Range   Glucose-Capillary 82 70 - 99 mg/dL    Comment: Glucose reference range applies only to samples taken after fasting for at least 8 hours.  CBC with Differential/Platelet     Status: Abnormal   Collection Time: 08/15/19  4:50 AM  Result Value Ref Range   WBC 11.7 (H) 4.0 - 10.5 K/uL   RBC 4.08 (L) 4.22 - 5.81 MIL/uL   Hemoglobin 11.8 (L) 13.0 - 17.0 g/dL   HCT 38.2 (L) 39.0 - 52.0 %   MCV 93.6 80.0 - 100.0 fL   MCH 28.9 26.0 - 34.0 pg   MCHC 30.9 30.0 - 36.0 g/dL   RDW 14.6 11.5 - 15.5 %   Platelets 365 150 - 400 K/uL   nRBC 0.0 0.0 - 0.2 %   Neutrophils Relative % 90 %   Neutro Abs 10.6 (H) 1.7 - 7.7 K/uL   Lymphocytes Relative 6 %   Lymphs Abs 0.7 0.7 - 4.0 K/uL   Monocytes Relative 2 %   Monocytes Absolute 0.3 0.1 - 1.0 K/uL   Eosinophils Relative 0 %   Eosinophils Absolute 0.0 0.0 - 0.5 K/uL   Basophils Relative 0 %   Basophils Absolute 0.0 0.0 - 0.1 K/uL   Immature Granulocytes 2 %   Abs Immature Granulocytes 0.17 (H) 0.00 - 0.07 K/uL    Comment: Performed at [facility], [address], [place], [place] [address]  Basic metabolic panel     Status: Abnormal   Collection Time: 08/15/19  4:50 AM  Result Value Ref Range   Sodium 132 (L) 135 - 145 mmol/L   Potassium 6.1 (H) 3.5 - 5.1 mmol/L   Chloride 102 98 - 111 mmol/L   CO2 21 (L) 22 - 32 mmol/L   Glucose, Bld 354 (H) 70 - 99 mg/dL    Comment: Glucose reference range applies only to samples taken after fasting for at least 8 hours.   BUN 55 (H) 6 - 20 mg/dL   Creatinine, Ser 1.70 (H) 0.61 - 1.24 mg/dL   Calcium 8.6 (L) 8.9 - 10.3 mg/dL   GFR calc non Af Amer 44 (L) >60 mL/min   GFR calc Af Amer 51 (L) >60 mL/min   Anion gap 9 5 - 15  Comment:  Performed at [facility], [place] [address], [place], [place] [address]  Glucose, capillary     Status: Abnormal   Collection Time: 08/15/19  6:36 AM  Result Value Ref Range   Glucose-Capillary 282 (H) 70 - 99 mg/dL    Comment: Glucose reference range applies only to samples taken after fasting for at least 8 hours.  Glucose, capillary     Status: Abnormal   Collection Time: 08/15/19 11:11 AM  Result Value Ref Range   Glucose-Capillary 308 (H) 70 - 99 mg/dL    Comment: Glucose reference range applies only to samples taken after fasting for at least 8 hours.   No results found.  PMH:   Past Medical History:  Diagnosis Date  . Atrial fibrillation ([place])   . Cellulitis and abscess of foot 07/2019   RIGHT FOOT  . Charcot's joint of right foot   . DDD (degenerative disc disease), lumbar   . Diabetes mellitus without complication ([place])   . Fatty liver   . Hypertension   . Obesity   . Rotator cuff disorder   . Shoulder impingement, right   . Spinal stenosis at L4-L5 level     PSH:   Past Surgical History:  Procedure Laterality Date  . AMPUTATION Left 10/02/2014   Procedure: Left Third toe amputation ;  Surgeon: [name], MD;  Location: [facility];  Service: Orthopedics;  Laterality: Left;  Regular bed, wants to follow hip  . ANTERIOR CRUCIATE LIGAMENT REPAIR Right 90   reconstruction  . APPLICATION OF WOUND VAC Left 10/02/2014   Procedure: APPLICATION OF WOUND VAC; toe Surgeon: [name], MD;  Location: [facility];  Service: Orthopedics;  Laterality: Left;  . CARDIOVERSION N/A 04/26/2019   Procedure: CARDIOVERSION;  Surgeon: Pixie Casino, MD;  Location[facility] END[facility]COPY;  Service: Cardiovascular;  Laterality: N/A;  . CARDIOVERSION N/A 05/18/2019   Procedure: CARDIOVERSION (CATH LAB);  Surgeon: Constance Haw, MD;  Location: [facility] CV LAB;  Service: Cardiovascular;  Laterality: N/A;  . I & D EXTREMITY Left 10/05/2014   Procedure: IRRIGATION AND DEBRIDEMENT LEFT FOOT;  Surgeon:  [name], MD;  Location: [facility];  Service: Orthopedics;  Laterality: Left;  . INCISION AND DRAINAGE Right 08/09/2019   Procedure: INCISION AND DRAINAGE RIGHT FOOT;  Surgeon: Trula Slade, DPM;  Location: [facility];  Service: Podiatry;  Laterality: Right;  . KNEE ARTHROSCOPY W/ ACL RECONSTRUCTION Right   . TOTAL KNEE ARTHROPLASTY Right 03/28/2015  . TOTAL KNEE ARTHROPLASTY Right 03/28/2015   Procedure: RIGHT TOTAL KNEE ARTHROPLASTY;  Surgeon: [name], MD;  Location: [facility];  Service: Orthopedics;  Laterality: Right;  . WOUND DEBRIDEMENT Right 08/14/2019   Procedure: DEBRIDEMENT WOUND;  Surgeon: Trula Slade, DPM;  Location: [facility];  Service: Podiatry;  Laterality: Right;    Allergies:  Allergies  Allergen Reactions  . Metformin And Related Other (See Comments)    GI upset    Medications:   Prior to Admission medications   Medication Sig Start Date End Date Taking? Authorizing Provider  Accu-Chek FastClix Lancets MISC Use as directed to check blood sugar at least twice daily. E11.8 E11.65 Z79.4 Patient taking differently: 1 each by Other route See admin instructions. Use as directed to check blood sugar at least twice daily. E11.8 E11.65 Z79.4 10/18/18  Yes [name], MD  amiodarone (PACERONE) 200 MG tablet TAKE ONE TABLET BY MOUTH TWICE A DAY Patient taking differently: Take 200 mg by  mouth 2 (two) times daily.  06/09/19  Yes Troy Sine, MD  apixaban (ELIQUIS) 5 MG TABS tablet Take 1 tablet (5 mg total) by mouth 2 (two) times daily. 06/20/19  Yes [name], MD  atorvastatin (LIPITOR) 20 MG tablet Take 1 tablet (20 mg total) by mouth daily. 03/28/19  Yes [name], MD  Blood Glucose Monitoring Suppl (ACCU-CHEK AVIVA) device Use as instructed to check blood sugar 2 times daily. E11.8 E11.65 Z79.4 Patient taking differently: 1 each by Other route See admin instructions. Use as instructed to check blood sugar 2 times daily. E11.8 E11.65 Z79.4 10/18/18 10/18/19 Yes  [name], MD  furosemide (LASIX) 40 MG tablet Take 1 tablet (40 mg total) by mouth daily. 04/21/19 08/07/19 Yes Troy Sine, MD  gabapentin (NEURONTIN) 300 MG capsule Take 1 capsule (300 mg total) by mouth 2 (two) times daily. Patient taking differently: Take 600 mg by mouth at bedtime as needed (pain).  03/28/19  Yes Newlin, Charlane Ferretti, MD  glucose blood (ACCU-CHEK AVIVA) test strip Use as instructed to check blood sugar 2 times daily. E11.8 E11.65 Z79.4 Patient taking differently: 1 each by Other route See admin instructions. Use as instructed to check blood sugar 2 times daily. E11.8 E11.65 Z79.4 10/18/18  Yes Newlin, Charlane Ferretti, MD  insulin glargine (LANTUS SOLOSTAR) 100 UNIT/ML Solostar Pen Inject 50 Units into the skin 2 (two) times daily. Patient taking differently: Inject 51 Units into the skin 2 (two) times daily.  06/20/19  Yes Newlin, Charlane Ferretti, MD  insulin lispro (INSULIN LISPRO) 100 UNIT/ML KwikPen Junior Inject 0.08 mLs (8 Units total) into the skin 3 (three) times daily. Patient taking differently: Inject 8 Units into the skin 3 (three) times daily as needed (Low blood sugar).  03/28/19  Yes Newlin, Charlane Ferretti, MD  Insulin Pen Needle (B-D ULTRAFINE III SHORT PEN) 31G X 8 MM MISC 1 each by Does not apply route 3 (three) times daily. 10/12/17  Yes [name], MD  Insulin Syringe-Needle U-100 (TRUEPLUS INSULIN SYRINGE) 30G X 5/16" 0.5 ML MISC Use as directed 3 times daily Patient taking differently: 1 each by Other route See admin instructions. Use as directed 3 times daily 10/06/17  Yes Newlin, Enobong, MD  liraglutide (VICTOZA) 18 MG/3ML SOPN Inject 0.3 mLs (1.8 mg total) into the skin daily. 06/20/19  Yes [name], MD  losartan (COZAAR) 50 MG tablet Take 1 tablet (50 mg total) by mouth daily. 06/20/19  Yes [name], MD  metoprolol tartrate (LOPRESSOR) 100 MG tablet Take 1 tablet (100 mg total) by mouth 2 (two) times daily. 06/20/19  Yes [name], MD   oxyCODONE-acetaminophen (PERCOCET) 10-325 MG tablet Take 1 tablet by mouth 4 (four) times daily.    Yes [provider]  Sennosides (EX-LAX) 15 MG TABS Take 15 mg by mouth daily as needed (Constipation).   Yes [provider]  sodium chloride (OCEAN) 0.65 % SOLN nasal spray Place 1 spray into both nostrils daily as needed for congestion.    Yes [provider]  TRUEPLUS INSULIN SYRINGE 30G X 5/16" 0.5 ML MISC USE AS DIRECTED 3 TIMES DAILY Patient taking differently: 1 each by Other route in the morning, at noon, and at bedtime.  04/03/16  Yes Tresa Garter, MD  testosterone cypionate (DEPOTESTOSTERONE CYPIONATE) 200 MG/ML injection Inject 1 mL (200 mg total) into the muscle every 28 (twenty-eight) days. Patient not taking: Reported on 08/07/2019 03/16/18   [name], MD    Discontinued Meds:   Medications  Discontinued During This Encounter  Medication Reason  . tiZANidine (ZANAFLEX) 2 MG tablet Patient Preference  . vancomycin (VANCOREADY) IVPB 2000 mg/400 mL   . bupivacaine (MARCAINE) 0.5 % injection Patient Discharge  . 0.9 % irrigation (POUR BTL) Patient Discharge  . Chlorhexidine Gluconate Cloth 2 % PADS 6 each Patient Transfer  . Chlorhexidine Gluconate Cloth 2 % PADS 6 each Patient Transfer  . lactated ringers infusion Patient Transfer  . fentaNYL (SUBLIMAZE) injection 25-50 mcg Patient Transfer  . ondansetron (ZOFRAN) injection 4 mg Patient Transfer  . fentaNYL (SUBLIMAZE) 100 MCG/2ML injection Returned to ADS  . dextrose 5 %-0.9 % sodium chloride infusion   . insulin aspart (novoLOG) injection 8 Units   . vancomycin (VANCOREADY) IVPB 1500 mg/300 mL   . vancomycin (VANCOREADY) IVPB 1250 mg/250 mL   . insulin aspart (novoLOG) injection 0-20 Units   . lactated ringers infusion Patient Transfer  . acetaminophen (TYLENOL) tablet 1,000 mg Patient Transfer  . fentaNYL (SUBLIMAZE) injection 25-50 mcg Patient Transfer  . insulin glargine (LANTUS)  injection 51 Units   . insulin aspart (novoLOG) injection 12 Units     Social History:  reports that he has quit smoking. His smoking use included cigarettes. He smoked 0.00 packs per day for 38.00 years. He has never used smokeless tobacco. He reports current drug use. Drug: Marijuana. He reports that he does not drink alcohol.  Family History:   Family History  Problem Relation Age of Onset  . Diabetes Father   . Hypertension Father   . Heart failure Father     Blood pressure (!) 148/67, pulse (!) 56, temperature 98.3 F (36.8 C), temperature source Oral, resp. rate 18, height 6' 2.02" (1.88 m), weight (!) 168.6 kg, SpO2 98 %. General appearance: alert, cooperative, appears stated age and Obese Head: Normocephalic, without obvious abnormality, atraumatic Eyes: negative Neck: no adenopathy, no carotid bruit, supple, symmetrical, trachea midline and thyroid not enlarged, symmetric, no tenderness/mass/nodules Back: symmetric, no curvature. ROM normal. No CVA tenderness. Resp: clear to auscultation bilaterally Cardio: S1, S2 normal GI: Obese, nl BS Extremities: edema tr+ Neurologic: Grossly normal       Zaydn Gutridge, Hunt Oris, MD 08/15/2019, 11:26 AM

## 2019-08-15 NOTE — Plan of Care (Signed)
°  Problem: Clinical Measurements: °Goal: Cardiovascular complication will be avoided °Outcome: Progressing °  °Problem: Activity: °Goal: Risk for activity intolerance will decrease °Outcome: Progressing °  °

## 2019-08-15 NOTE — Progress Notes (Addendum)
Subjective: POD #1 s/p right foot wound debridement/excision with Acell. Doing well. Upon entering the room standing on the foot ready to go home. Minimal pain to the foot. Creatine and K elevated today. Nephrology consulted.   Objective: AAO x3, NAD Right: Bandage clean, dry, intact without strike through Left: Superficial abrasion to the distal aspect of the toe along the DIPJ.  No ascending cellulitis there is no drainage or pus. No pain with calf compression, swelling, warmth, erythema  Assessment: Postop day #1 status post right foot wound excision, ACell; left foot abrasion  Plan: Please remained afebrile and with blood cell count greatly improved.  Minimal pain.  From podiatry standpoint he can be discharged with oral antibiotics for 10 days based on cultures.  Recommend antibiotic ointment dressing changes to left second toe daily monitoring signs or symptoms of infection.  Offloading.  Awaiting blood work tomorrow to be discharged per primary.  However follow-up in the office on Friday for dressing change  Celesta Gentile, DPM

## 2019-08-16 LAB — CBC WITH DIFFERENTIAL/PLATELET
Abs Immature Granulocytes: 0.19 10*3/uL — ABNORMAL HIGH (ref 0.00–0.07)
Basophils Absolute: 0 10*3/uL (ref 0.0–0.1)
Basophils Relative: 0 %
Eosinophils Absolute: 0.1 10*3/uL (ref 0.0–0.5)
Eosinophils Relative: 1 %
HCT: 35.4 % — ABNORMAL LOW (ref 39.0–52.0)
Hemoglobin: 10.8 g/dL — ABNORMAL LOW (ref 13.0–17.0)
Immature Granulocytes: 1 %
Lymphocytes Relative: 12 %
Lymphs Abs: 1.6 10*3/uL (ref 0.7–4.0)
MCH: 29.1 pg (ref 26.0–34.0)
MCHC: 30.5 g/dL (ref 30.0–36.0)
MCV: 95.4 fL (ref 80.0–100.0)
Monocytes Absolute: 0.7 10*3/uL (ref 0.1–1.0)
Monocytes Relative: 5 %
Neutro Abs: 10.5 10*3/uL — ABNORMAL HIGH (ref 1.7–7.7)
Neutrophils Relative %: 81 %
Platelets: 363 10*3/uL (ref 150–400)
RBC: 3.71 MIL/uL — ABNORMAL LOW (ref 4.22–5.81)
RDW: 14.6 % (ref 11.5–15.5)
WBC: 13.2 10*3/uL — ABNORMAL HIGH (ref 4.0–10.5)
nRBC: 0 % (ref 0.0–0.2)

## 2019-08-16 LAB — BASIC METABOLIC PANEL
Anion gap: 7 (ref 5–15)
BUN: 49 mg/dL — ABNORMAL HIGH (ref 6–20)
CO2: 23 mmol/L (ref 22–32)
Calcium: 8.7 mg/dL — ABNORMAL LOW (ref 8.9–10.3)
Chloride: 103 mmol/L (ref 98–111)
Creatinine, Ser: 1.39 mg/dL — ABNORMAL HIGH (ref 0.61–1.24)
GFR calc Af Amer: >60 mL/min (ref >60)
GFR calc non Af Amer: 56 mL/min — ABNORMAL LOW (ref >60)
Glucose, Bld: 192 mg/dL — ABNORMAL HIGH (ref 70–99)
Potassium: 5 mmol/L (ref 3.5–5.1)
Sodium: 133 mmol/L — ABNORMAL LOW (ref 135–145)

## 2019-08-16 LAB — GLUCOSE, CAPILLARY
Glucose-Capillary: 185 mg/dL — ABNORMAL HIGH (ref 70–99)
Glucose-Capillary: 103 mg/dL — ABNORMAL HIGH (ref 70–99)

## 2019-08-16 MED ORDER — METOPROLOL SUCCINATE ER 100 MG PO TB24: 100.0000 mg | ORAL_TABLET | Freq: Every day | ORAL | 3 refills | Status: DC

## 2019-08-16 MED ORDER — METOPROLOL SUCCINATE ER 100 MG PO TB24
100.0000 mg | ORAL_TABLET | Freq: Every day | ORAL | Status: DC
  Administered 2019-08-16: 100 mg via ORAL
  Filled 2019-08-16: qty 1

## 2019-08-16 MED ORDER — CEPHALEXIN 500 MG PO CAPS: 500.0000 mg | ORAL_CAPSULE | Freq: Three times a day (TID) | ORAL | 0 refills | Status: DC

## 2019-08-16 MED ORDER — POLYETHYLENE GLYCOL 3350 17 G PO PACK: 17.0000 g | PACK | Freq: Every day | ORAL | 0 refills | Status: AC | PRN

## 2019-08-16 NOTE — Plan of Care (Signed)
  Problem: Clinical Measurements: Goal: Ability to maintain clinical measurements within normal limits will improve Outcome: Progressing   Problem: Activity: Goal: Risk for activity intolerance will decrease Outcome: Progressing   

## 2019-08-16 NOTE — Discharge Summary (Signed)
Physician Discharge Summary  Jeffery [name] OEH:[id number] DOB: June 26, 1960 DOA: 08/07/2019  PCP: Jeffery Rakes, MD  Admit date: 08/07/2019 Discharge date: 08/16/2019  Time spent: 30 minutes  Recommendations for Outpatient Follow-up:  1. CBC plus Chem-12 1 week as OP with PCP 2. Needs podiatry review of wound potentially 5/22 Jeffery [name] will CC on this note-needs to be offloading on that leg 3. Recommend outpatient pain management Jeffery [name] follow-up 4. No home health or other needs identified the patient was ambulatory on discharge 5. No change of some meds-metoprolol changed to extended release 100 daily, also discontinued ARB this admission because of AKI and hyperkalemia  Discharge Diagnoses:  Principal Problem:   Severe sepsis with acute organ dysfunction due to methicillin susceptible Staphylococcus aureus (MSSA) (Jeffery [name]) Active Problems:   Essential hypertension   Uncontrolled type 2 diabetes mellitus with diabetic polyneuropathy, without Jeffery-term current use of insulin (HCC)   Paroxysmal atrial fibrillation (HCC)   Right foot ulcer, with fat layer exposed (Jeffery [name])   Cellulitis of right lower extremity   Mixed diabetic hyperlipidemia associated with type 2 diabetes mellitus (Jeffery [name])   AKI (acute kidney injury) (Jeffery [name])   Sepsis due to methicillin susceptible Staphylococcus aureus (MSSA) with acute renal failure Jeffery [name])   Discharge Condition: Improved  Diet recommendation: Diabetic heart healthy  Filed Weights   08/14/19 1644 08/15/19 0029 08/16/19 0455  Weight: (!) 162.9 kg (!) 168.6 kg (!) 171.5 kg    History of present illness:  59 white male DM TY 2 nephropathy neuropathy chronic pain, probable sleep apnea, super morbid obesity HTN HLD, prior heavy EtOH P A. fib status diagnosed 2018 post DCCV X3 in the past followed most recently by Jeffery [name] 05/15/2019 Admitted and found to have suspected osteomyelitis started antibiotics surgical debridement 514, repeat debridement  83 MSSA growing in wound   Hospital Course:  Severe sepsis on admission 2/2 MSSA soft tissue infection-Jeffery [name] has performed to debridement he will have follow-up of his wound 5/21, Keflex to be continued until 5/24   AKI + hyperkalemia Nephrology saw the patient in consult and recommended that his labs to be followed-he is stable for discharge from my perspective- potassium has come down and I suspect this may been secondary to recalibration of either blood sugars or use of losartan  HTN transition off of metoprolol as below  Permanent A. fib CHADS2 score >3 Continue anticoagulation-transition from short-acting to Jeffery-acting metoprolol as below  Uncontrolled DM TY 2+ neuropathy + nephropathy A1c 7.1 Reinitiated slightly lower doses on hospitalization went back to full home doses on discharge-continuing to monitor in the outpatient setting  Chronic back pain and chronic other pains with prior right knee repair, left shoulder issues Patient sees a pain [name] He also sees a neurosurgeon He will follow up with them for refills on meds   Discharge Exam: Vitals:   08/16/19 0607 08/16/19 0948  BP:  (!) 162/86  Pulse:  (!) 55  Resp:  18  Temp: 98.3 F (36.8 C) (!) 97.5 F (36.4 C)  SpO2:  99%   Right lower extremity is completely wrapped in a Kerlix Left lower extremity looks good with some intertriginous areas of redness however this seems to be stable compared to prior description  General: Sitting up awake alert pleasant no distress Cardiovascular: S1-S2 and atrial fibrillation rate controlled Respiratory: Clinically clear no added sound no rales no rhonchi Abdomen soft no rebound no guarding Neurologically intact  Discharge Instructions   Discharge Instructions    Diet -  low sodium heart healthy   Complete by: As directed    Discharge instructions   Complete by: As directed    You were diagnosed with a wound to your right lower extremity and had surgery on it and  will be reviewed by the podiatrist as per his instructions to you-you should complete the entire course of Keflex that has been prescribed without fail as this may cause worsening if you stop it too early Please follow-up with your primary physician in about a week to ensure that lab work is done to check your other blood work such as your kidney function and your potassium that were slightly elevated in the hospital I would also recommend that you follow-up with your pain physician and your neurosurgeon for your chronic low back pain shoulder pain and medications for refills as we will not be refilling them on discharge   Increase activity slowly   Complete by: As directed      Allergies as of 08/16/2019      Reactions   Metformin And Related Other (See Comments)   GI upset      Medication List    STOP taking these medications   losartan 50 MG tablet Commonly known as: COZAAR   metoprolol tartrate 100 MG tablet Commonly known as: LOPRESSOR   testosterone cypionate 200 MG/ML injection Commonly known as: DEPOTESTOSTERONE CYPIONATE     TAKE these medications   Accu-Chek Aviva device Use as instructed to check blood sugar 2 times daily. E11.8 E11.65 Z79.4 What changed:   how much to take  how to take this  when to take this   Accu-Chek Aviva test strip Generic drug: glucose blood Use as instructed to check blood sugar 2 times daily. E11.8 E11.65 Z79.4 What changed:   how much to take  how to take this  when to take this   Accu-Chek FastClix Lancets Misc Use as directed to check blood sugar at least twice daily. E11.8 E11.65 Z79.4 What changed:   how much to take  how to take this  when to take this   amiodarone 200 MG tablet Commonly known as: PACERONE TAKE ONE TABLET BY MOUTH TWICE A DAY   apixaban 5 MG Tabs tablet Commonly known as: Eliquis Take 1 tablet (5 mg total) by mouth 2 (two) times daily.   atorvastatin 20 MG tablet Commonly known as:  LIPITOR Take 1 tablet (20 mg total) by mouth daily.   cephALEXin 500 MG capsule Commonly known as: KEFLEX Take 1 capsule (500 mg total) by mouth every 8 (eight) hours for 6 days.   Ex-Lax 15 MG Tabs Generic drug: Sennosides Take 15 mg by mouth daily as needed (Constipation).   furosemide 40 MG tablet Commonly known as: LASIX Take 1 tablet (40 mg total) by mouth daily.   gabapentin 300 MG capsule Commonly known as: NEURONTIN Take 1 capsule (300 mg total) by mouth 2 (two) times daily. What changed:   how much to take  when to take this  reasons to take this   insulin lispro 100 UNIT/ML KwikPen Junior Generic drug: insulin lispro Inject 0.08 mLs (8 Units total) into the skin 3 (three) times daily. What changed:   when to take this  reasons to take this   Insulin Pen Needle 31G X 8 MM Misc Commonly known as: B-D ULTRAFINE III SHORT PEN 1 each by Does not apply route 3 (three) times daily.   Lantus SoloStar 100 UNIT/ML Solostar Pen Generic drug: insulin glargine  Inject 50 Units into the skin 2 (two) times daily. What changed: how much to take   metoprolol succinate 100 MG 24 hr tablet Commonly known as: TOPROL-XL Take 1 tablet (100 mg total) by mouth daily. Take with or immediately following a meal. Start taking on: Aug 17, 2019   oxyCODONE-acetaminophen 10-325 MG tablet Commonly known as: PERCOCET Take 1 tablet by mouth 4 (four) times daily.   polyethylene glycol 17 g packet Commonly known as: MIRALAX / GLYCOLAX Take 17 g by mouth daily as needed for mild constipation.   sodium chloride 0.65 % Soln nasal spray Commonly known as: OCEAN Place 1 spray into both nostrils daily as needed for congestion.   TRUEplus Insulin Syringe 30G X 5/16" 0.5 ML Misc Generic drug: Insulin Syringe-Needle U-100 USE AS DIRECTED 3 TIMES DAILY What changed: See the new instructions.   Insulin Syringe-Needle U-100 30G X 5/16" 0.5 ML Misc Commonly known as: TRUEplus Insulin  Syringe Use as directed 3 times daily What changed:   how much to take  how to take this  when to take this   Victoza 18 MG/3ML Sopn Generic drug: liraglutide Inject 0.3 mLs (1.8 mg total) into the skin daily.      Allergies  Allergen Reactions  . Metformin And Related Other (See Comments)    GI upset      The results of significant diagnostics from this hospitalization (including imaging, microbiology, ancillary and laboratory) are listed below for reference.    Significant Diagnostic Studies: MR THORACIC SPINE WO CONTRAST  Result Date: 07/27/2019 CLINICAL DATA:  Midthoracic back pain extending around the right-side of the chest. EXAM: MRI THORACIC SPINE WITHOUT CONTRAST TECHNIQUE: Multiplanar, multisequence MR imaging of the thoracic spine was performed. No intravenous contrast was administered. COMPARISON:  Radiography 06/05/2019 FINDINGS: Alignment:  Normal Vertebrae: Old compression fracture at T6 with loss of height of 1/3. No retropulsed bone. Old minimal superior endplate depressions at T4 and T5. No recent fracture. No focal bone lesion. Cord:  No primary cord lesion.  See below regarding stenosis. Paraspinal and other soft tissues: Negative Disc levels: T1-2: Right-sided disc bulge.  No neural compression. T2-3: Right-sided facet hypertrophy.  No neural compression. T3-4 through T8-9: Small noncompressive disc bulges. T9-10: Right posterolateral disc herniation contacting the right side of the cord. Proximal foraminal encroachment on the right likely to affect the right T9 nerve. T10-11 and T11-12: Normal IMPRESSION: Right posterolateral disc herniation at T9-10 with proximal foraminal extension likely to affect the right T9 nerve. Old healed minor compression findings at T4, T5 and T6. No recent fracture. Other minor degenerative changes as described above without apparent neural compression. Electronically Signed   By: Nelson Chimes M.D.   On: 07/27/2019 11:32   MR FOOT  RIGHT WO CONTRAST  Result Date: 08/08/2019 CLINICAL DATA:  Ulcer at the bottom of the right foot. Ulcers located at the base of the great toe. Ongoing pain which is worsening. EXAM: MRI OF THE RIGHT FOREFOOT WITHOUT CONTRAST TECHNIQUE: Multiplanar, multisequence MR imaging of the right forefoot was performed. No intravenous contrast was administered. COMPARISON:  None. FINDINGS: Bones/Joint/Cartilage Soft tissue ulcer at the plantar base of the first MTP joint. No focal marrow signal abnormality to suggest osteomyelitis. No cortical destruction or osteolysis. No periosteal reaction. No fracture or dislocation. Normal alignment. No joint effusion. Severe osteoarthritis of the third, fourth and fifth tarsometatarsal joints with subchondral reactive marrow edema. Mild-moderate osteoarthritis of the first and second tarsometatarsal joints with subchondral marrow edema.  Severe fragmentation of the navicular with advanced arthropathy of the talonavicular joint and naviculocuneiform joint. Mild-moderate osteoarthritis of the calcaneocuboid joint. Ligaments Collateral ligaments are intact. Lisfranc ligament is intact. Muscles and Tendons Flexor, peroneal and extensor compartment tendons are intact. Generalized muscle atrophy. Soft tissue 2.2 x 4 x 4.2 cm multiloculated fluid collection along the dorsal lateral aspect of the foot at the level of the fourth and fifth TMT joints concerning for a large ganglion cyst. Nonspecific soft tissue edema of the dorsal aspect of the forefoot. No soft tissue mass. No hematoma. IMPRESSION: 1. Soft tissue ulcer at the plantar base of the first MTP joint. No evidence of osteomyelitis. 2. 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 3. A 2.2 x 4 x 4.2 cm multiloculated fluid collection along the dorsal lateral aspect of the foot at the level of the fourth and fifth TMT joints concerning for a large ganglion cyst. Electronically Signed   By: Kathreen Devoid   On: 08/08/2019  11:01   DG Foot Complete Right  Result Date: 08/07/2019 CLINICAL DATA:  Foot infection, chronic EXAM: RIGHT FOOT COMPLETE - 3+ VIEW COMPARISON:  June 28, 2019 FINDINGS: Again noted is advanced fragmentation and arthropathy at the midfoot. A ossicle is seen at the superior talonavicular joint. There is inferior migration at the talonavicular joint with flattening and advanced fragmentation of the navicular. Diffuse soft tissue swelling is seen within the plantar surface of the midfoot and the dorsum of the midfoot. No subcutaneous emphysema is seen. Tibiotalar joint osteoarthritis is noted. There is diffuse osteopenia and pes planus deformity. 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 No definite acute osseous abnormality. Worsening diffuse soft tissue swelling seen on the dorsum and plantar surface of the midfoot Electronically Signed   By: Prudencio Pair M.D.   On: 08/07/2019 22:02    Microbiology: Recent Results (from the past 240 hour(s))  Culture, blood (Routine x 2)     Status: None   Collection Time: 08/07/19  7:00 PM   Specimen: BLOOD  Result Value Ref Range Status   Specimen Description BLOOD LEFT ARM  Final   Special Requests   Final    BOTTLES DRAWN AEROBIC AND ANAEROBIC Blood Culture results may not be optimal due to an inadequate volume of blood received in culture bottles   Culture   Final    NO GROWTH 5 DAYS Performed at [facility], [place] [name] [place], [place] [address]    Report Status 08/12/2019 FINAL  Final  Culture, blood (Routine x 2)     Status: None   Collection Time: 08/07/19  7:02 PM   Specimen: BLOOD  Result Value Ref Range Status   Specimen Description BLOOD RIGHT HAND  Final   Special Requests   Final    BOTTLES DRAWN AEROBIC AND ANAEROBIC Blood Culture results may not be optimal due to an inadequate volume of blood received in culture bottles   Culture   Final    NO GROWTH 5 DAYS Performed  at [facility], [place] [address], [place], [place] [address]    Report Status 08/12/2019 FINAL  Final  SARS Coronavirus 2 by RT PCR (hospital order, performed in [facility] hospital lab) Nasopharyngeal Nasopharyngeal Swab     Status: None   Collection Time: 08/07/19  9:25 PM   Specimen: Nasopharyngeal Swab  Result Value Ref Range Status   SARS Coronavirus 2 NEGATIVE NEGATIVE Final    Comment: (  NOTE) SARS-CoV-2 target nucleic acids are NOT DETECTED. The SARS-CoV-2 RNA is generally detectable in upper and lower respiratory specimens during the acute phase of infection. The lowest concentration of SARS-CoV-2 viral copies this assay can detect is 250 copies / mL. A negative result does not preclude SARS-CoV-2 infection and should not be used as the sole basis for treatment or other patient management decisions.  A negative result may occur with improper specimen collection / handling, submission of specimen other than nasopharyngeal swab, presence of viral mutation(s) within the areas targeted by this assay, and inadequate number of viral copies (<250 copies / mL). A negative result must be combined with clinical observations, patient history, and epidemiological information. Fact Sheet for Patients:   StrictlyIdeas.no Fact Sheet for Healthcare Providers: BankingDealers.co.za This test is not yet approved or cleared  by the [place] FDA and has been authorized for detection and/or diagnosis of SARS-CoV-2 by FDA under an Emergency Use Authorization (EUA).  This EUA will remain in effect (meaning this test can be used) for the duration of the COVID-19 declaration under Section 564(b)(1) of the Act, 21 U.S.C. section 360bbb-3(b)(1), unless the authorization is terminated or revoked sooner. Performed at [facility], [place] [address], [place], [place] [address]   Urine culture     Status: Abnormal   Collection Time: 08/08/19   5:36 AM   Specimen: In/Out Cath Urine  Result Value Ref Range Status   Specimen Description IN/OUT CATH URINE  Final   Special Requests   Final    NONE Performed at [facility], [place] [address], [place], [place] [address]    Culture 100 COLONIES/mL ENTEROBACTER AEROGENES (A)  Final   Report Status 08/10/2019 FINAL  Final   Organism ID, Bacteria ENTEROBACTER AEROGENES (A)  Final      Susceptibility   Enterobacter aerogenes - MIC*    CEFAZOLIN >=64 RESISTANT Resistant     CEFTRIAXONE <=1 SENSITIVE Sensitive     CIPROFLOXACIN <=0.25 SENSITIVE Sensitive     GENTAMICIN <=1 SENSITIVE Sensitive     IMIPENEM 2 SENSITIVE Sensitive     NITROFURANTOIN 64 INTERMEDIATE Intermediate     TRIMETH/SULFA <=20 SENSITIVE Sensitive     PIP/TAZO 8 SENSITIVE Sensitive     * 100 COLONIES/mL ENTEROBACTER AEROGENES  Aerobic/Anaerobic Culture (surgical/deep wound)     Status: None   Collection Time: 08/09/19  8:43 PM   Specimen: PATH Other; Tissue  Result Value Ref Range Status   Specimen Description FOOT RIGHT  Final   Special Requests NONE  Final   Gram Stain   Final    MODERATE WBC PRESENT,BOTH PMN AND MONONUCLEAR MODERATE GRAM POSITIVE COCCI IN PAIRS    Culture   Final    ABUNDANT STAPHYLOCOCCUS AUREUS NO ANAEROBES ISOLATED Performed at [facility], [address], [place], [place] [address]    Report Status 08/15/2019 FINAL  Final   Organism ID, Bacteria STAPHYLOCOCCUS AUREUS  Final      Susceptibility   Staphylococcus aureus - MIC*    CIPROFLOXACIN <=0.5 SENSITIVE Sensitive     ERYTHROMYCIN <=0.25 SENSITIVE Sensitive     GENTAMICIN <=0.5 SENSITIVE Sensitive     OXACILLIN <=0.25 SENSITIVE Sensitive     TETRACYCLINE <=1 SENSITIVE Sensitive     VANCOMYCIN <=0.5 SENSITIVE Sensitive     TRIMETH/SULFA <=10 SENSITIVE Sensitive     CLINDAMYCIN <=0.25 SENSITIVE Sensitive     RIFAMPIN <=0.5 SENSITIVE Sensitive     Inducible Clindamycin NEGATIVE Sensitive     *  ABUNDANT STAPHYLOCOCCUS  AUREUS  Fungus Culture With Stain     Status: None (Preliminary result)   Collection Time: 08/09/19  8:43 PM  Result Value Ref Range Status   Fungus Stain Final report  Final    Comment: (NOTE) Performed At: [facility] [place], [place] [id number] Rush Farmer MD [id number]    Fungus (Mycology) Culture PENDING  Incomplete   Fungal Source FOOT  Final    Comment: RIGHT Performed at [facility], [place] [address], [place], [place] [address]   Fungus Culture Result     Status: None   Collection Time: 08/09/19  8:43 PM  Result Value Ref Range Status   Result 1 Comment  Final    Comment: (NOTE) KOH/Calcofluor preparation:  no fungus observed. Performed At: [facility] [place], [place] [id number] Rush Farmer MD [id number]   Surgical pcr screen     Status: None   Collection Time: 08/14/19  1:02 AM   Specimen: Nasal Mucosa; Nasal Swab  Result Value Ref Range Status   MRSA, PCR NEGATIVE NEGATIVE Final   Staphylococcus aureus NEGATIVE NEGATIVE Final    Comment: (NOTE) The Xpert SA Assay (FDA approved for NASAL specimens in patients 61 years of age and older), is one component of a comprehensive surveillance program. It is not intended to diagnose infection nor to guide or monitor treatment. Performed at [facility], [place] [address], [place],  [address]      Labs: Basic Metabolic Panel: Recent Labs  Lab 08/13/19 [phone] 08/14/19 0506 08/15/19 0450 08/15/19 1628 08/16/19 0626  NA 138 133* 132* 131* 133*  K 4.5 4.5 6.1* 5.3* 5.0  CL 105 104 102 101 103  CO2 24 21* 21* 19* 23  GLUCOSE 153* 158* 354* 275* 192*  BUN 43* 54* 55* 53* 49*  CREATININE 1.84* 1.83* 1.70* 1.65* 1.39*  CALCIUM 8.9 8.2* 8.6* 8.4* 8.7*   Liver Function Tests: No results for input(s): AST, ALT, ALKPHOS, BILITOT, PROT, ALBUMIN in the last 168 hours. No results for input(s): LIPASE, AMYLASE in the last 168 hours. No results for  input(s): AMMONIA in the last 168 hours. CBC: Recent Labs  Lab 08/12/19 0416 08/13/19 0333 08/14/19 0506 08/15/19 0450 08/16/19 0626  WBC 12.9* 13.2* 12.0* 11.7* 13.2*  NEUTROABS 9.7* 8.6* 7.7 10.6* 10.5*  HGB 11.3* 12.8* 11.3* 11.8* 10.8*  HCT 36.2* 42.1 36.7* 38.2* 35.4*  MCV 93.1 95.7 94.6 93.6 95.4  PLT 375 457* 352 365 363   Cardiac Enzymes: No results for input(s): CKTOTAL, CKMB, CKMBINDEX, TROPONINI in the last 168 hours. BNP: BNP (last 3 results) No results for input(s): BNP in the last 8760 hours.  ProBNP (last 3 results) No results for input(s): PROBNP in the last 8760 hours.  CBG: Recent Labs  Lab 08/15/19 0636 08/15/19 1111 08/15/19 1635 08/15/19 2050 08/16/19 0642  GLUCAP 282* 308* 256* 313* 185*       Signed:  Nita Sells MD   [facility] 08/16/2019, 10:42 AM

## 2019-08-16 NOTE — Progress Notes (Signed)
Spoke with Dr. Leigh Aurora office.  They stated patient could use one of his boots at home to protect the right foot.  The office will call patient at his mobile number to let him know which boot to use.  Patient advised.  Earleen Reaper RN

## 2019-08-16 NOTE — Progress Notes (Signed)
[facility] Progress Note   59 y.o. male DM HTN HLD pAfib s/p CV 05/2019 presenting to ED because of persistent right foot ulcer and pain. He has had an ulceration in the rt foot for 3 mths but it has become larger and more painful especially with weight bearing.  Foot culture (5/12)  grew out MSSA and Vancomycin (5/11-5/14)  was transitioned to Cefazolin.  He had the foot wound I&D by Dr. Jacqualyn Posey on 5/12 and then a debridement  on 5/17 w/ purulence noted from the lateral aspect of the food, debrided to healthy tissue with no tracking noted.  His BL Cr is 0.98 and Cr had risen and peaked in the  1.8 range.   Assessment/ Plan:   1. Renal failure with associated hyperkalemia - acute component likely associated with Vancomycin + infection but fortunately his renal function is starting to improve. I don't have a great explanation for the hyperkalemia as I would expect the improvement in renal function not to elevate the potassium - Renal function improving trends and serum potassium improved as well.  - Will sign off at this time.  2. HTN - continue anti hypertensives; may need to add the other meds on his home regimen. 3. DM 4. pAfib 5. Sepsis s/p I&D + debridement  Subjective:   Feeling well and wants to go home.  Denies f/c/n/v. + appetite   Objective:   BP 135/83   Pulse (!) 52   Temp (!) 97.5 F (36.4 C) (Oral)   Resp 18   Ht 6' 2.02" (1.88 m)   Wt (!) 171.5 kg   SpO2 96%   BMI 48.52 kg/m   Intake/Output Summary (Last 24 hours) at 08/16/2019 1232 Last data filed at 08/16/2019 0900 Gross per 24 hour  Intake 2199.71 ml  Output 2060 ml  Net 139.71 ml   Weight change: 8.601 kg  Physical Exam: General appearance: NAD Head: NCAT Resp: clear to auscultation bilaterally Cardio: S1, S2 normal GI: Obese, nl BS Extremities: edema 1+ Neurologic: Grossly normal  Imaging: No results found.  Labs: BMET Recent Labs  Lab 08/11/19 0538 08/12/19 0416  08/13/19 0333 08/14/19 0506 08/15/19 0450 08/15/19 1628 08/16/19 0626  NA 139 137 138 133* 132* 131* 133*  K 4.9 4.5 4.5 4.5 6.1* 5.3* 5.0  CL 104 108 105 104 102 101 103  CO2 26 23 24  21* 21* 19* 23  GLUCOSE 128* 201* 153* 158* 354* 275* 192*  BUN 26* 33* 43* 54* 55* 53* 49*  CREATININE 1.10 1.26* 1.84* 1.83* 1.70* 1.65* 1.39*  CALCIUM 9.1 8.5* 8.9 8.2* 8.6* 8.4* 8.7*   CBC Recent Labs  Lab 08/13/19 0333 08/14/19 0506 08/15/19 0450 08/16/19 0626  WBC 13.2* 12.0* 11.7* 13.2*  NEUTROABS 8.6* 7.7 10.6* 10.5*  HGB 12.8* 11.3* 11.8* 10.8*  HCT 42.1 36.7* 38.2* 35.4*  MCV 95.7 94.6 93.6 95.4  PLT 457* 352 365 363    Medications:    . acetaminophen  1,000 mg Oral Once  . amiodarone  200 mg Oral BID  . amLODipine  5 mg Oral Daily  . apixaban  5 mg Oral BID  . atorvastatin  20 mg Oral Daily  . cephALEXin  500 mg Oral Q8H  . insulin aspart  0-9 Units Subcutaneous TID WC  . insulin aspart  8 Units Subcutaneous TID WC  . insulin glargine  40 Units Subcutaneous BID  . metoprolol succinate  100 mg Oral Daily      Otelia Santee, MD  08/16/2019, 12:32 PM

## 2019-08-17 ENCOUNTER — Telehealth: Payer: Self-pay

## 2019-08-17 ENCOUNTER — Ambulatory Visit: Payer: Medicaid Other | Admitting: Podiatry

## 2019-08-17 NOTE — Telephone Encounter (Signed)
Transition Care Management Follow-up Telephone Call  Date of discharge and from where: 08/16/2019, [facility]   How have you been since you were released from the hospital? He said he has "been hanging in there."  Any questions or concerns?none at this time   Items Reviewed:  Did the pt receive and understand the discharge instructions provided?  yes  Medications obtained and verified?  he said that he has all medications except the ex-lax. No questions about his medications at this time.   Any new allergies since your discharge?  none reported   Do you have support at home?his mother  Other (ie: DME, Home Health, etc) no home health ordered.   Has a cane.  Dressing intact on right foot. He said that Dr Jacqualyn Posey will remove it tomorrow and he may have to see the doctor every 5 days for wound check/wound care.   Has glucometer. Blood sugar this morning 81 and this afternoon 181.   Functional Questionnaire: (I = Independent and D = Dependent) ADL's: independent . Mother assists as needed   Follow up appointments reviewed:    PCP Hospital f/u appt confirmed?Dr Margarita Rana  - 09/18/2019.  He did not want to schedule anything sooner. He said he would just come to the office if he has any questions prior to that time.    [facility] f/u appt confirmed? *  Dr Jacqualyn Posey - 08/18/2019.  Cardiology - 08/29/2019.    Are transportation arrangements needed?  mother drives  If their condition worsens, is the pt aware to call  their PCP or go to the ED?  yes  Was the patient provided with contact information for the PCP's office or ED?   He has the phone number for the clinic  Was the pt encouraged to call back with questions or concerns?  yes

## 2019-08-17 NOTE — Telephone Encounter (Signed)
From the discharge call.   he said that he has all medications except the ex-lax.   No questions/concerns at this time  Dressing intact on right foot. He said that Dr Jacqualyn Posey will remove it tomorrow and he may have to see the doctor every 5 days for wound check/wound care.   Has glucometer. Blood sugar this morning 81 and this afternoon 181.   Appointment with Dr Margarita Rana  - 09/18/2019.  He did not want to schedule anything sooner. He said he would just come to the office if he has any questions prior to that time.

## 2019-08-18 ENCOUNTER — Other Ambulatory Visit: Payer: Self-pay

## 2019-08-18 ENCOUNTER — Ambulatory Visit: Payer: Medicaid Other | Admitting: Podiatry

## 2019-08-18 DIAGNOSIS — E11621 Type 2 diabetes mellitus with foot ulcer: Secondary | ICD-10-CM

## 2019-08-18 DIAGNOSIS — L97512 Non-pressure chronic ulcer of other part of right foot with fat layer exposed: Secondary | ICD-10-CM

## 2019-08-18 DIAGNOSIS — L97521 Non-pressure chronic ulcer of other part of left foot limited to breakdown of skin: Secondary | ICD-10-CM | POA: Diagnosis not present

## 2019-08-18 DIAGNOSIS — M7989 Other specified soft tissue disorders: Secondary | ICD-10-CM

## 2019-08-18 DIAGNOSIS — E11610 Type 2 diabetes mellitus with diabetic neuropathic arthropathy: Secondary | ICD-10-CM

## 2019-08-18 DIAGNOSIS — L03115 Cellulitis of right lower limb: Secondary | ICD-10-CM

## 2019-08-18 MED ORDER — CIPROFLOXACIN HCL 500 MG PO TABS
500.0000 mg | ORAL_TABLET | Freq: Two times a day (BID) | ORAL | 0 refills | Status: DC
Start: 2019-08-18 — End: 2019-09-05

## 2019-08-18 NOTE — Progress Notes (Signed)
Subjective: [name] presents to the office today for follow-up evaluation of infection to the right foot, sepsis.  He has been on Keflex since he has been doing well.  He denies any pain to the foot.  He previously underwent incision and drainage and return to the operating room for wound excision, ACell application.  He presents today for dressing change.  His main concern today is that he has had swelling to both of his legs and has had drainage to his left leg which is new since being discharged from the hospital. Denies any systemic complaints such as fevers, chills, nausea, vomiting. No acute changes since last appointment, and no other complaints at this time.   Objective: AAO x3, NAD Pitting edema present bilaterally and there is clear drainage on the left leg.  On the left second toe superficial wound at the dorsal DIPJ.  There is no open sores identified the leg currently otherwise. On the right side dorsal lateral aspect with graft intact.  Minimal edema and erythema but there is no fluctuation or crepitation peer there is no drainage or pus coming from the wound and there is no malodor.  Pitting edema to the leg without any drainage on the right leg. No open lesions or pre-ulcerative lesions.  No pain with calf compression, swelling, warmth, erythema  Assessment: 59 year old male with bilateral pitting edema, left leg drainage; right foot status post incision and drainage, graft application  Plan: -All treatment options discussed with the patient including all alternatives, risks, complications.  -Remove the bandage today.  Graft seems to be adhering.  Minimal erythema along the incision.  Continue Keflex and also to ciprofloxacin to broaden the coverage.  Petroleum jelly and gauze was applied along the graft on the right side. -Given the swelling Kurz elevation, limit salt intake and follow-up with primary care physician.  Unna boots were applied bilaterally.  Precautions were advised  on when to remove this.  Antibiotic ointment left second toe. -Follow-up on Monday or sooner if needed. -Patient encouraged to call the office with any questions, concerns, change in symptoms.   Trula Slade DPM

## 2019-08-21 ENCOUNTER — Other Ambulatory Visit: Payer: Self-pay

## 2019-08-21 ENCOUNTER — Encounter: Payer: Self-pay | Admitting: Podiatry

## 2019-08-21 ENCOUNTER — Telehealth: Payer: Self-pay | Admitting: *Deleted

## 2019-08-21 ENCOUNTER — Ambulatory Visit (INDEPENDENT_AMBULATORY_CARE_PROVIDER_SITE_OTHER): Payer: Medicaid Other | Admitting: Podiatry

## 2019-08-21 ENCOUNTER — Telehealth: Payer: Self-pay | Admitting: Cardiology

## 2019-08-21 DIAGNOSIS — E11621 Type 2 diabetes mellitus with foot ulcer: Secondary | ICD-10-CM

## 2019-08-21 DIAGNOSIS — L97512 Non-pressure chronic ulcer of other part of right foot with fat layer exposed: Secondary | ICD-10-CM

## 2019-08-21 DIAGNOSIS — E11610 Type 2 diabetes mellitus with diabetic neuropathic arthropathy: Secondary | ICD-10-CM

## 2019-08-21 DIAGNOSIS — L03115 Cellulitis of right lower limb: Secondary | ICD-10-CM

## 2019-08-21 MED ORDER — CEPHALEXIN 500 MG PO CAPS: 500.0000 mg | ORAL_CAPSULE | Freq: Three times a day (TID) | ORAL | 0 refills | Status: AC

## 2019-08-21 NOTE — Telephone Encounter (Signed)
Mateo Flow with [facility] is calling with concerns regarding patient's recent symptoms. Please return call to Mateo Flow to discuss at [phone].  Pt c/o Shortness Of Breath: STAT if SOB developed within the last 24 hours or pt is noticeably SOB on the phone  1. Are you currently SOB (can you hear that pt is SOB on the phone)? N/A  2. How long have you been experiencing SOB? Since 08/07/19.  3. Are you SOB when sitting or when up moving around? Both  4. Are you currently experiencing any other symptoms? Swelling   Pt c/o swelling: STAT is pt has developed SOB within 24 hours  1) How much weight have you gained and in what time span? Not sure   2) If swelling, where is the swelling located? All of legs, hands, and face  3) Are you currently taking a fluid pill? Not sure   4) Are you currently SOB? N/A  5) Do you have a log of your daily weights (if so, list)? No  6) Have you gained 3 pounds in a day or 5 pounds in a week? No  7) Have you traveled recently? No

## 2019-08-21 NOTE — Telephone Encounter (Signed)
Dr. Jacqualyn Posey requested pt be seen earlier than 08/29/2019, due to fluid overload symptoms of Short of Breath, overall edema worse in legs with weeping and exhaustion even at rest. I spoke with [place] and she states she will have the nurse call, but also scheduled for 08/25/2019 at 8:00am.

## 2019-08-21 NOTE — Progress Notes (Signed)
Subjective: Mr. Pease presents to the office today for follow-up evaluation of infection to the right foot, sepsis.  He has been on Keflex since he has been doing well.  I had a Cipro last appointment.  He states that overall he is doing well from the feet except for the Unna boot was very tight he had a remove it yesterday.  He does not want this reapplied.  Overall he denies any fevers, chills, nausea, vomiting.  He feels that he is tired.  Some intermittent shortness of breath but no chest pain.  He has an appointment scheduled to see his cardiologist next week.  Objective: AAO x3, NAD Pitting edema present bilaterally and there is clear drainage on the left and right leg today.  There is no purulence.  There is no erythema associated with the swelling.  There is no fluctuation or crepitation.  On the left dorsal second DIPJ is a granular wound without any significant cellulitis or pus.  On the right side status post incision and drainage, wound excision with graft intact.  I remove the overlying Adaptic and the graft appears to be taking.  There is no purulence.  Minimal erythema but no significant warmth.  There is no ascending cellulitis.  There is no fluctuation or crepitation any malodor. No open lesions or pre-ulcerative lesions.  No pain with calf compression, swelling, warmth, erythema  Assessment: 59 year old male with bilateral pitting edema, leg drainage; right foot status post incision and drainage, graft application  Plan: -All treatment options discussed with the patient including all alternatives, risks, complications.  -Dressed patient's were changed today.  Appears that the graft is adhering and there is granulation tissue forming underneath the graft.  There is no obvious signs of infection, keep him on antibiotics.  Refilled Keflex and finish course of ciprofloxacin.  Continue elevation.  Continue to wrap his legs with Ace bandages. -I am concerned about the swelling to both legs  as well as shortness of breath although seems to be somewhat better.  He has no active chest pain or other issues.  I tried contacting the primary care physician's office.  We reached out to cardiology.  Has an appointment on Friday as scheduled and the nurse will call back.  Advised the patient if there is any worsening is go to the emergency room or urgent care.  RTC 1 week  Trula Slade DPM

## 2019-08-21 NOTE — Telephone Encounter (Signed)
Spoke with Mateo Flow at [facility]. She called to report patient has edema with weeping in both legs and was short of breath on the phone.   Called patient. Spoke for 23 minutes. Patient not short of breath on the phone. Reports mild dyspnea due to the way he was sitting. Reports his abdomen was pushing up into his lungs, patient repositioned himself and symptoms resolved.   Patient is unable to weigh himself at home, does not have a scale that will work with his current weight.   Patient reports he does have swelling in his legs but that they look better than before. He reports he is elevating his legs and avoiding added salt.   Patient is taking 40mg  of Lasix daily.   Patient's metoprolol changed to succinate 100mg  QD from tartrate 100mg  BID. Patient's Losartan was discontinued due to potassium levels. At discharge patient was advised to follow up with PCP in one week to assess potassium level.   Patient would like Dr. Claiborne Billings to review his medication changes and give his input.   Patient reports if he develops worsened shortness of breath or swelling he will go to the ER.

## 2019-08-21 NOTE — Telephone Encounter (Signed)
I informed pt of Dr. Leigh Aurora request and new appt of 08/25/2019 at 8:00am. Pt states understanding.

## 2019-08-21 NOTE — Telephone Encounter (Signed)
I informed Jeffery [name] - [facility] of Pt's status per Dr. Jacqualyn Posey and she states she will contact pt.

## 2019-08-21 NOTE — Addendum Note (Signed)
Addended by: Celesta Gentile R on: 08/21/2019 07:11 PM   Modules accepted: Orders

## 2019-08-22 ENCOUNTER — Telehealth: Payer: Self-pay | Admitting: Cardiovascular Disease

## 2019-08-22 NOTE — Telephone Encounter (Addendum)
Spoke to Dr. Curt Bears.  Advised to take Lasix 80 mg for next 3 days, then return to normal dosing. Pt reports that he just picked up Rx for Lopressor 100 mg tablets  - stated they never filled the Toprol.  He would like to know if ok to take the Lopressor instead.  Advised pt that this may be acceptable to MD as long at pt is complaint w/ BID dosing (as he was only taking it once daily prior to adx).  Advised to take 1/2 tablet BID until hears back from [place] tomorrow..  Aware I will let him know, once reviewed by Dr. Curt Bears, if ok to continue Lopressor or switch to Toprol  Stressed importance of keeping appt Friday w/ Dillon Bjork, PA. Patient verbalized understanding and agreeable to plan.

## 2019-08-22 NOTE — Telephone Encounter (Signed)
New Message:   [facility] called. She wanted to report that pt is not taking any Metoprolol. She is concerned, because of his Atrial Fib.

## 2019-08-23 ENCOUNTER — Ambulatory Visit (INDEPENDENT_AMBULATORY_CARE_PROVIDER_SITE_OTHER): Payer: Medicaid Other | Admitting: Podiatry

## 2019-08-23 ENCOUNTER — Other Ambulatory Visit: Payer: Self-pay

## 2019-08-23 DIAGNOSIS — M7989 Other specified soft tissue disorders: Secondary | ICD-10-CM

## 2019-08-23 DIAGNOSIS — L03115 Cellulitis of right lower limb: Secondary | ICD-10-CM

## 2019-08-23 DIAGNOSIS — L97512 Non-pressure chronic ulcer of other part of right foot with fat layer exposed: Secondary | ICD-10-CM

## 2019-08-23 DIAGNOSIS — E11621 Type 2 diabetes mellitus with foot ulcer: Secondary | ICD-10-CM

## 2019-08-24 MED ORDER — METOPROLOL TARTRATE 100 MG PO TABS: 50.0000 mg | ORAL_TABLET | Freq: Two times a day (BID) | ORAL | 2 refills | Status: DC

## 2019-08-24 NOTE — Progress Notes (Signed)
Cardiology Office Note Date:  08/25/2019  Patient ID:  Jeffery [name], Jeffery [name] 1961/01/16, MRN [id number] PCP:  [name], MD  Cardiologist:  Dr. Claiborne Billings EP: Dr. Curt Bears    Chief Complaint: SOB, edema  History of Present Illness: Jeffery [name] is a 59 y.o. male with history of obesity, DM, HTN, ETOH abuse (4 cases beer/week >>> Afib episodes >> stopped drinking/smoking 2018), chronic back pain  Nov 2020 he saw D. Claiborne Billings, he was found in AFib (pt unaware), started on amiodarone.  echo February 28, 2019.  EF was 55 to 60% and there was moderate LVH.  He had mild dilation of his left atrium with moderate dilation of his right atrium.  There was mild MR, mild TR.  He had moderately elevated pulmonary artery systolic pressure at 10.2  Dr. Claiborne Billings recommended sleep study though pt did not want to pursue it. Feb 2021, remained in AF, noted edema, SOB, referred for DCCV, failed, referred to Dr. Curt Bears, recommended repeat DCCV with double defibrillator that was successful.    The patient was hospitalized 08/07/2019 with sepsis, AKI 2/2 MSSA (right) foot infection, treated with antibiotics, had debridement 5/14 and 5/21 discharged 08/16/19. EKG on his admission was SR His home lopressor was changed to Toprol and his ARB was stopped 2/2 hyperkalemia/AKI  He comes in today to be seen for Dr. Curt Bears.   08/22/19 pt's podiatrist office called with reports of edema and SOB.  Via telephone instructed to take lasix 80mg  for 3 days, he had just picked up his lopressor (there was some discussion of noncompliance with BID dosing and had been recommended to change to Toprol, though he had just filled the lopressor and wanted to stay on this   He reports that since the hospital he has had marked SOB with exertion and LE swelling.  mentions that "they kept pumping me full of fluids and I just kept swelling up"  NO CP, palpitations.  He reports compliance with his medicines and is able to list them all off to  me by memory.  He is taking the lopressor as written, says his amiodarone is 100mg  BID, not 200mg  BID. He reports compliance with his Eliquis, no bleeding or signs of bleeding Yesterday was the 1st day of 80mg  lasix and reports brisk increase in urine out put and already significant improvement in his LLE swelling.  He has been seeing podiatry every other day for wound checks and bandage changes.   That he has a wound on the lateral side where they did the surgery and a ulcer on the bottom of hs foot. He has been in the orthopedic boot since the hospital, is allowed weight bearing but the floors in his home are slippery and does not ambulate much.  He denies rest SOB, depending on positioning.  He says if sitting leaning forward he gets SOB but not new and 2/2 "my large gut".  He sleeps in bed, on his L side 2/2 chronic back and shoulder pain.  No night time SOB, wakes avery couple hours because of back pain.  He is not interested in sleep study, says he does not sleep long enough to get any benefit, that Dr. Claiborne Billings has discussed and recommended in the past.    AFib hx Diagnosed 2018 (s/p heavy night of drinking)  AAD Hx Amiodarone started nov 2020 >> current   Past Medical History:  Diagnosis Date  . Atrial fibrillation ([place])   . Cellulitis and abscess of foot 07/2019  RIGHT FOOT  . Charcot's joint of right foot   . DDD (degenerative disc disease), lumbar   . Diabetes mellitus without complication ([place])   . Fatty liver   . Hypertension   . Obesity   . Rotator cuff disorder   . Shoulder impingement, right   . Spinal stenosis at L4-L5 level     Past Surgical History:  Procedure Laterality Date  . AMPUTATION Left 10/02/2014   Procedure: Left Third toe amputation ;  Surgeon: [name], MD;  Location: [place];  Service: Orthopedics;  Laterality: Left;  Regular bed, wants to follow hip  . ANTERIOR CRUCIATE LIGAMENT REPAIR Right 90   reconstruction  . APPLICATION OF WOUND VAC Left  10/02/2014   Procedure: APPLICATION OF WOUND VAC; toe Surgeon: [name], MD;  Location: [place];  Service: Orthopedics;  Laterality: Left;  . CARDIOVERSION N/A 04/26/2019   Procedure: CARDIOVERSION;  Surgeon: Pixie Casino, MD;  Location: [facility] ENDOSCOPY;  Service: Cardiovascular;  Laterality: N/A;  . CARDIOVERSION N/A 05/18/2019   Procedure: CARDIOVERSION (CATH LAB);  Surgeon: Constance Haw, MD;  Location: [place] CV LAB;  Service: Cardiovascular;  Laterality: N/A;  . I & D EXTREMITY Left 10/05/2014   Procedure: IRRIGATION AND DEBRIDEMENT LEFT FOOT;  Surgeon: [name], MD;  Location: [place];  Service: Orthopedics;  Laterality: Left;  . INCISION AND DRAINAGE Right 08/09/2019   Procedure: INCISION AND DRAINAGE RIGHT FOOT;  Surgeon: Trula Slade, DPM;  Location: [place];  Service: Podiatry;  Laterality: Right;  . KNEE ARTHROSCOPY W/ ACL RECONSTRUCTION Right   . TOTAL KNEE ARTHROPLASTY Right 03/28/2015  . TOTAL KNEE ARTHROPLASTY Right 03/28/2015   Procedure: RIGHT TOTAL KNEE ARTHROPLASTY;  Surgeon: [name], MD;  Location: [place];  Service: Orthopedics;  Laterality: Right;  . WOUND DEBRIDEMENT Right 08/14/2019   Procedure: DEBRIDEMENT WOUND;  Surgeon: Trula Slade, DPM;  Location: [place];  Service: Podiatry;  Laterality: Right;    Current Outpatient Medications  Medication Sig Dispense Refill  . Accu-Chek FastClix Lancets MISC Use as directed to check blood sugar at least twice daily. E11.8 E11.65 Z79.4 (Patient taking differently: 1 each by Other route See admin instructions. Use as directed to check blood sugar at least twice daily. E11.8 E11.65 Z79.4) 102 each 11  . amiodarone (PACERONE) 200 MG tablet TAKE ONE TABLET BY MOUTH TWICE A DAY (Patient taking differently: Take 200 mg by mouth 2 (two) times daily. ) 180 tablet 2  . apixaban (ELIQUIS) 5 MG TABS tablet Take 1 tablet (5 mg total) by mouth 2 (two) times daily. 60 tablet 6  . atorvastatin (LIPITOR) 20 MG tablet Take 1  tablet (20 mg total) by mouth daily. 90 tablet 1  . Blood Glucose Monitoring Suppl (ACCU-CHEK AVIVA) device Use as instructed to check blood sugar 2 times daily. E11.8 E11.65 Z79.4 (Patient taking differently: 1 each by Other route See admin instructions. Use as instructed to check blood sugar 2 times daily. E11.8 E11.65 Z79.4) 1 each 0  . cephALEXin (KEFLEX) 500 MG capsule Take 1 capsule (500 mg total) by mouth every 8 (eight) hours for 6 days. 18 capsule 0  . ciprofloxacin (CIPRO) 500 MG tablet Take 1 tablet (500 mg total) by mouth 2 (two) times daily. 20 tablet 0  . furosemide (LASIX) 40 MG tablet Take 1 tablet (40 mg total) by mouth daily. 90 tablet 3  . gabapentin (NEURONTIN) 300 MG capsule Take 1 capsule (300 mg total) by  mouth 2 (two) times daily. (Patient taking differently: Take 600 mg by mouth at bedtime as needed (pain). ) 180 capsule 1  . glucose blood (ACCU-CHEK AVIVA) test strip Use as instructed to check blood sugar 2 times daily. E11.8 E11.65 Z79.4 (Patient taking differently: 1 each by Other route See admin instructions. Use as instructed to check blood sugar 2 times daily. E11.8 E11.65 Z79.4) 100 each 12  . insulin glargine (LANTUS SOLOSTAR) 100 UNIT/ML Solostar Pen Inject 50 Units into the skin 2 (two) times daily. (Patient taking differently: Inject 51 Units into the skin 2 (two) times daily. ) 30 mL 6  . insulin lispro (INSULIN LISPRO) 100 UNIT/ML KwikPen Junior Inject 0.08 mLs (8 Units total) into the skin 3 (three) times daily. (Patient taking differently: Inject 8 Units into the skin 3 (three) times daily as needed (Low blood sugar). ) 30 mL 6  . Insulin Pen Needle (B-D ULTRAFINE III SHORT PEN) 31G X 8 MM MISC 1 each by Does not apply route 3 (three) times daily. 100 each 5  . Insulin Syringe-Needle U-100 (TRUEPLUS INSULIN SYRINGE) 30G X 5/16" 0.5 ML MISC Use as directed 3 times daily (Patient taking differently: 1 each by Other route See admin instructions. Use as directed 3 times  daily) 100 each 5  . liraglutide (VICTOZA) 18 MG/3ML SOPN Inject 0.3 mLs (1.8 mg total) into the skin daily. 30 mL 6  . metoprolol tartrate (LOPRESSOR) 100 MG tablet Take 0.5 tablets (50 mg total) by mouth 2 (two) times daily. 90 tablet 2  . oxyCODONE-acetaminophen (PERCOCET) 10-325 MG tablet Take 1 tablet by mouth 4 (four) times daily.     . polyethylene glycol (MIRALAX / GLYCOLAX) 17 g packet Take 17 g by mouth daily as needed for mild constipation. 14 each 0  . Sennosides (EX-LAX) 15 MG TABS Take 15 mg by mouth daily as needed (Constipation).    . sodium chloride (OCEAN) 0.65 % SOLN nasal spray Place 1 spray into both nostrils daily as needed for congestion.     Karen Chafe INSULIN SYRINGE 30G X 5/16" 0.5 ML MISC USE AS DIRECTED 3 TIMES DAILY (Patient taking differently: 1 each by Other route in the morning, at noon, and at bedtime. ) 100 each 0   No current facility-administered medications for this visit.    Allergies:   Metformin and related   Social History:  The patient  reports that he has quit smoking. His smoking use included cigarettes. He smoked 0.00 packs per day for 38.00 years. He has never used smokeless tobacco. He reports current drug use. Drug: Marijuana. He reports that he does not drink alcohol.   Family History:  The patient's family history includes Diabetes in his father; Heart failure in his father; Hypertension in his father.  ROS:  Please see the history of present illness.  All other systems are reviewed and otherwise negative.   PHYSICAL EXAM:  VS:  BP 130/76   Pulse (!) 53   Ht 6\' 2"  (1.88 m)   Wt (!) 380 lb (172.4 kg)   BMI 48.79 kg/m  BMI: Body mass index is 48.79 kg/m.  O2 sat 96% room air Well nourished, well developed, in no acute distress  HEENT: normocephalic, atraumatic  Neck: no JVD, carotid bruits or masses Cardiac:  RRR; no significant murmurs, no rubs, or gallops Lungs:  CTA b/l, no wheezing, rhonchi or rales  Abd: soft, nontender,  obese MS: no deformity or atrophy Ext: RLE is in a orthopedic  boot, LLE has 2++ edema to the knee.  He has asymmetric swelling R/L, has a few superficial skin wound LLE appear to be broken blisters, no active draining or weeping noted Skin: warm and dry, no rash Neuro:  No gross deficits appreciated Psych: euthymic mood, full affect   EKG:  Done today and reviewed by myself:  SB 53bpm, no acute looking changes   02/28/2019: TTE IMPRESSIONS  1. Left ventricular ejection fraction, by visual estimation, is 55 to  60%. The left ventricle has normal function. There is moderately increased  left ventricular hypertrophy.  2. Left ventricular diastolic function could not be evaluated.  3. Global right ventricle has mildly reduced systolic function.The right  ventricular size is normal. No increase in right ventricular wall  thickness.  4. Left atrial size was mildly dilated.  5. Right atrial size was moderately dilated.  6. The mitral valve is normal in structure. Mild mitral valve  regurgitation. No evidence of mitral stenosis.  7. The tricuspid valve is normal in structure. Tricuspid valve  regurgitation is mild.  8. The aortic valve is tricuspid. Aortic valve regurgitation is not  visualized. No evidence of aortic valve sclerosis or stenosis.  9. The pulmonic valve was normal in structure. Pulmonic valve  regurgitation is not visualized.  10. Moderately elevated pulmonary artery systolic pressure.  11. The atrial septum is grossly normal.     08/18/2016 ECHO Study Conclusions  - Left ventricle: The cavity size was mildly dilated. Wall thickness was increased in a pattern of mild LVH. Systolic function was normal. The estimated ejection fraction was in the range of 50% to 55%. Wall motion was normal; there were no regional wall motion abnormalities. Doppler parameters are consistent with abnormal left ventricular relaxation (grade 1 diastolic dysfunction). -  Aortic valve: Transvalvular velocity was within the normal range. There was no stenosis. There was no regurgitation. - Mitral valve: Transvalvular velocity was within the normal range. There was no evidence for stenosis. There was trivial regurgitation. - Left atrium: The atrium was moderately dilated. - Right ventricle: The cavity size was normal. Wall thickness was normal. Systolic function was normal. - Tricuspid valve: There was no regurgitation.  ------------------------------------------------------------------- August 2019  [facility] Conclusions  - Left ventricle: The cavity size was moderately dilated. Wall thickness was normal. Systolic function was normal. The estimated ejection fraction was in the range of 55% to 60%. Wall motion was normal; there were no regional wall motion abnormalities. Doppler parameters are consistent with abnormal left ventricular relaxation (grade 1 diastolic dysfunction). - Right ventricle: The cavity size was moderately dilated. Wall thickness was normal. - Pulmonary arteries: Systolic pressure was mildly increased. PA peak pressure: 33 mm Hg (S).   Gated SPECT myocardial perfusion study Highlights    Normal perfusion No ischemia or scar  LVEF is calculated at 48% Visual estimate suggests LVEF is greater Consider echo to confirm  The study is normal.  There was no ST segment deviation noted during stress.   ECHO 02/28/2019 IMPRESSIONS 1. Left ventricular ejection fraction, by visual estimation, is 55 to 60%. The left ventricle has normal function. There is moderately increased left ventricular hypertrophy. 2. Left ventricular diastolic function could not be evaluated. 3. Global right ventricle has mildly reduced systolic function.The right ventricular size is normal. No increase in right ventricular wall thickness. 4. Left atrial size was mildly dilated. 5. Right atrial size was moderately dilated. 6.  The mitral valve is normal in structure. Mild mitral valve regurgitation. No  evidence of mitral stenosis. 7. The tricuspid valve is normal in structure. Tricuspid valve regurgitation is mild. 8. The aortic valve is tricuspid. Aortic valve regurgitation is not visualized. No evidence of aortic valve sclerosis or stenosis. 9. The pulmonic valve was normal in structure. Pulmonic valve regurgitation is not visualized. 10. Moderately elevated pulmonary artery systolic pressure. 11. The atrial septum is grossly normal.   Recent Labs: 02/13/2019: TSH 2.490 08/08/2019: ALT 31 08/09/2019: Magnesium 2.2 08/16/2019: BUN 49; Creatinine, Ser 1.39; Hemoglobin 10.8; Platelets 363; Potassium 5.0; Sodium 133  No results found for requested labs within last 8760 hours.   Estimated Creatinine Clearance: 98.1 mL/min (A) (by C-G formula based on SCr of 1.39 mg/dL (H)).   Wt Readings from Last 3 Encounters:  08/25/19 (!) 380 lb (172.4 kg)  08/16/19 (!) 378 lb 1.6 oz (171.5 kg)  06/20/19 (!) 353 lb (160.1 kg)     Other studies reviewed: Additional studies/records reviewed today include: summarized above  ASSESSMENT AND PLAN:  1. Paroxysmal Afib     CHA2DS2Vasc is 2, on Eliquis, appropriately dosed     Remains in SR     On amio  Morbid obesity makes him a poor ablation candidate Suspect sleep apnea, he is not interested in study  He is young for amio, hopefully can get him off this in the future, would not now given volume OL and acute illness/hospitalization only a couple weeks ago.   2. HTN     Looks goo  3. Super morbid obesity 4. Likely sleep apnea     He remarks that he just can not keep from gaining weight in the last year despite dietary changes     Seems motivated, once foot heels up, hopefull can get moving/exercising to help        5. Volume OL 6. Acute/chronic CHF (diastolic)     No rest SOB or night time symptoms     Has had brisk uptick in his urine op with 80mg  lasix       Follow with further recs pending labs today with recent AKI     He has asymmetrical edema R>L, likely 2/2 RLE immobility, he is on Eliquis, he will see podiatry fater [place] today     CMET, TSH, CBC today   Disposition: F/u with Dr. Claiborne Billings or cardiology APP in a week or so, sooner if needed.  Plan EP follow in 3 months.    Current medicines are reviewed at length with the patient today.  The patient did not have any concerns regarding medicines.    Venetia Night, PA-C 08/25/2019 8:59 AM     [facility] [address] [place] [address] [phone] (office)  [phone] (fax)

## 2019-08-24 NOTE — Telephone Encounter (Signed)
Advised pt ok to take Lopressor 50 mg BID, per Dr. Curt Bears. Informed that MD is agreeable to Lopressor as long as he is compliant w/ BID dosing. Aware that Jeffery Standard, PA will address if he needs to increase it to 100 mg at tomorrows appt (per Dr. Curt Bears.) Patient verbalized understanding and agreeable to plan.  Will forward to [facility] for her [facility]

## 2019-08-25 ENCOUNTER — Ambulatory Visit: Payer: Medicaid Other | Admitting: Podiatry

## 2019-08-25 ENCOUNTER — Ambulatory Visit (INDEPENDENT_AMBULATORY_CARE_PROVIDER_SITE_OTHER): Payer: Medicaid Other | Admitting: Physician Assistant

## 2019-08-25 ENCOUNTER — Other Ambulatory Visit: Payer: Self-pay

## 2019-08-25 ENCOUNTER — Other Ambulatory Visit: Payer: Self-pay | Admitting: Physician Assistant

## 2019-08-25 VITALS — BP 130/76 | HR 53 | Ht 74.0 in | Wt 380.0 lb

## 2019-08-25 VITALS — Temp 97.6°F

## 2019-08-25 DIAGNOSIS — Z79899 Other long term (current) drug therapy: Secondary | ICD-10-CM | POA: Diagnosis not present

## 2019-08-25 DIAGNOSIS — I480 Paroxysmal atrial fibrillation: Secondary | ICD-10-CM | POA: Diagnosis not present

## 2019-08-25 DIAGNOSIS — R0602 Shortness of breath: Secondary | ICD-10-CM

## 2019-08-25 DIAGNOSIS — I10 Essential (primary) hypertension: Secondary | ICD-10-CM

## 2019-08-25 DIAGNOSIS — I5033 Acute on chronic diastolic (congestive) heart failure: Secondary | ICD-10-CM | POA: Diagnosis not present

## 2019-08-25 DIAGNOSIS — M7989 Other specified soft tissue disorders: Secondary | ICD-10-CM

## 2019-08-25 DIAGNOSIS — E11621 Type 2 diabetes mellitus with foot ulcer: Secondary | ICD-10-CM

## 2019-08-25 DIAGNOSIS — L97512 Non-pressure chronic ulcer of other part of right foot with fat layer exposed: Secondary | ICD-10-CM | POA: Diagnosis not present

## 2019-08-25 LAB — COMPREHENSIVE METABOLIC PANEL
ALT: 17 IU/L (ref 0–44)
AST: 16 IU/L (ref 0–40)
Albumin/Globulin Ratio: 0.8 — ABNORMAL LOW (ref 1.2–2.2)
Albumin: 2.8 g/dL — ABNORMAL LOW (ref 3.8–4.9)
Alkaline Phosphatase: 68 IU/L (ref 48–121)
BUN/Creatinine Ratio: 23 — ABNORMAL HIGH (ref 9–20)
BUN: 36 mg/dL — ABNORMAL HIGH (ref 6–24)
Bilirubin Total: 0.4 mg/dL (ref 0.0–1.2)
CO2: 26 mmol/L (ref 20–29)
Calcium: 8.2 mg/dL — ABNORMAL LOW (ref 8.7–10.2)
Chloride: 103 mmol/L (ref 96–106)
Creatinine, Ser: 1.6 mg/dL — ABNORMAL HIGH (ref 0.76–1.27)
GFR calc Af Amer: 54 mL/min/{1.73_m2} — ABNORMAL LOW (ref >59)
GFR calc non Af Amer: 47 mL/min/{1.73_m2} — ABNORMAL LOW (ref >59)
Globulin, Total: 3.6 g/dL (ref 1.5–4.5)
Glucose: 179 mg/dL — ABNORMAL HIGH (ref 65–99)
Potassium: 5.4 mmol/L — ABNORMAL HIGH (ref 3.5–5.2)
Sodium: 136 mmol/L (ref 134–144)
Total Protein: 6.4 g/dL (ref 6.0–8.5)

## 2019-08-25 LAB — CBC
Hematocrit: 32.2 % — ABNORMAL LOW (ref 37.5–51.0)
Hemoglobin: 10.4 g/dL — ABNORMAL LOW (ref 13.0–17.7)
MCH: 28.4 pg (ref 26.6–33.0)
MCHC: 32.3 g/dL (ref 31.5–35.7)
MCV: 88 fL (ref 79–97)
Platelets: 377 10*3/uL (ref 150–450)
RBC: 3.66 x10E6/uL — ABNORMAL LOW (ref 4.14–5.80)
RDW: 14.8 % (ref 11.6–15.4)
WBC: 14.1 10*3/uL — ABNORMAL HIGH (ref 3.4–10.8)

## 2019-08-25 LAB — TSH: TSH: 5.74 u[IU]/mL — ABNORMAL HIGH (ref 0.450–4.500)

## 2019-08-25 NOTE — Telephone Encounter (Signed)
Agree with changes in plan for follow-up office visit as noted

## 2019-08-25 NOTE — Patient Instructions (Signed)
Medication Instructions:   Your physician recommends that you continue on your current medications as directed. Please refer to the Current Medication list given to you today.  *If you need a refill on your cardiac medications before your next appointment, please call your pharmacy*   Lab Work:  CMET  CBC TSH    If you have labs (blood work) drawn today and your tests are completely normal, you will receive your results only by: Marland Kitchen MyChart Message (if you have MyChart) OR . A paper copy in the mail If you have any lab test that is abnormal or we need to change your treatment, we will call you to review the results.   Testing/Procedures: NONE ORDERED  TODAY   Follow-Up: At [facility], you and your health needs are our priority.  As part of our continuing mission to provide you with exceptional heart care, we have created designated Provider Care Teams.  These Care Teams include your primary Cardiologist (physician) and Advanced Practice Providers (APPs -  Physician Assistants and Nurse Practitioners) who all work together to provide you with the care you need, when you need it.  We recommend signing up for the patient portal called "MyChart".  Sign up information is provided on this After Visit Summary.  MyChart is used to connect with patients for Virtual Visits (Telemedicine).  Patients are able to view lab/test results, encounter notes, upcoming appointments, etc.  Non-urgent messages can be sent to your provider as well.   To learn more about what you can do with MyChart, go to [email or web address].    Your next appointment:    1 week(s)  The format for your next appointment:   In Person  Provider:   You may see Shelva Majestic, MD or one of the following Advanced Practice Providers on your designated Care Team:    Almyra Deforest, PA-C  Fabian Sharp, PA-C or   Roby Lofts, [place]    Other Instructions

## 2019-08-29 ENCOUNTER — Other Ambulatory Visit: Payer: Self-pay

## 2019-08-29 ENCOUNTER — Other Ambulatory Visit: Payer: Self-pay | Admitting: *Deleted

## 2019-08-29 ENCOUNTER — Ambulatory Visit: Payer: Medicaid Other | Admitting: Podiatry

## 2019-08-29 ENCOUNTER — Encounter: Payer: Self-pay | Admitting: Podiatry

## 2019-08-29 ENCOUNTER — Ambulatory Visit: Payer: Medicaid Other | Admitting: Cardiology

## 2019-08-29 VITALS — BP 105/85 | HR 66 | Temp 96.3°F | Resp 14

## 2019-08-29 DIAGNOSIS — D492 Neoplasm of unspecified behavior of bone, soft tissue, and skin: Secondary | ICD-10-CM

## 2019-08-29 DIAGNOSIS — Z79899 Other long term (current) drug therapy: Secondary | ICD-10-CM

## 2019-08-29 DIAGNOSIS — L03115 Cellulitis of right lower limb: Secondary | ICD-10-CM

## 2019-08-29 DIAGNOSIS — M7989 Other specified soft tissue disorders: Secondary | ICD-10-CM

## 2019-08-29 DIAGNOSIS — I10 Essential (primary) hypertension: Secondary | ICD-10-CM

## 2019-08-29 DIAGNOSIS — E11621 Type 2 diabetes mellitus with foot ulcer: Secondary | ICD-10-CM

## 2019-08-29 DIAGNOSIS — I480 Paroxysmal atrial fibrillation: Secondary | ICD-10-CM

## 2019-08-29 DIAGNOSIS — E0590 Thyrotoxicosis, unspecified without thyrotoxic crisis or storm: Secondary | ICD-10-CM

## 2019-08-29 NOTE — Telephone Encounter (Signed)
Pt seen by Tommye Standard, PA on 08/25/19 and has follow up with Almyra Deforest on 09/05/19

## 2019-08-29 NOTE — Progress Notes (Signed)
Subjective: BRYLAN DEC is a 59 y.o. is seen today in office s/p right foot wound excision, debridement Acell application preformed on 08/14/2019.  States that overall he is doing better and overall feels better.  He is followed with cardiology.  Still with swelling and drainage to the legs.  Denies any systemic complaints such as fevers, chills, nausea, vomiting. No calf pain, chest pain, shortness of breath.   Objective: General: No acute distress, AAOx3  DP/PT pulses palpable 2/4, CRT < 3 sec to all digits.  RIGHT foot: When the dorsal lateral aspect the foot with graft intact.  There is no fluctuation crepitation there is no drainage.  Submetatarsal 1 wound is healed.  Mild hyperkeratotic tissue over the area. BILATERAL: Edema present to bilateral lower extremities and pitting.  There is clear drainage coming from the legs and there are multiple superficial wounds present from the way the blisters.  Swelling to both legs.  There is mild erythema to the feet but appears to be symmetrical and there is no warmth.  There is no malodor. No other areas of tenderness to bilateral lower extremities.  No other open lesions or pre-ulcerative lesions.  No pain with calf compression, swelling, warmth, erythema.     Assessment and Plan:  Status post left foot wound excision, debridement with bilateral lower extremity edema/blisters  -Treatment options discussed including all alternatives, risks, and complications -From a wound standpoint of the left foot graft is incorporating.  No obvious signs of infection noted.  There is significant edema bilaterally but overall improving.  Continue elevation.  Need to wraps the legs.  Part of the wounds try with small meta Betadine followed by 4 x 4's, Kerlix, Coban.  We will try for home health care. -Pain medication as needed. -Monitor for any clinical signs or symptoms of infection and DVT/PE and directed to call the office immediately should any occur or  go to the ER. -Follow-up as scheduled for dressing change or sooner if any problems arise. In the meantime, encouraged to call the office with any questions, concerns, change in symptoms.   Celesta Gentile, DPM

## 2019-08-30 ENCOUNTER — Encounter: Payer: Self-pay | Admitting: Podiatry

## 2019-08-30 NOTE — Progress Notes (Addendum)
Subjective:  Patient ID: Jeffery [name], male    DOB: 1960/04/13,  MRN: [id number]  No chief complaint on file.   59 y.o. male presents with the above complaint.  Patient presents with a follow-up of bilateral lower extremity lymphedema as well as Unna boot applications to bilateral lower extremity.  He states that he is doing overall pretty well.  He denies any fever chills nausea vomiting.  He has constant drainage of fluid from the lower extremity.  He is well-known to the Dr. Jacqualyn [name] who has been treating the wound to the right lateral foot as well as the lymphedema to the lower extremity.  Is scheduled to see his cardiologist this time a week.  He has been taking his antibiotics.  He mentioned that he would like a boot to help support the right leg when he is walking.  He would like to know if he could get a cam boot.  Review of Systems: Negative except as noted in the HPI. Denies N/V/F/Ch.  Past Medical History:  Diagnosis Date  . Atrial fibrillation ([place])   . Cellulitis and abscess of foot 07/2019   RIGHT FOOT  . Charcot's joint of right foot   . DDD (degenerative disc disease), lumbar   . Diabetes mellitus without complication ([place])   . Fatty liver   . Hypertension   . Obesity   . Rotator cuff disorder   . Shoulder impingement, right   . Spinal stenosis at L4-L5 level     Current Outpatient Medications:  .  Accu-Chek FastClix Lancets MISC, Use as directed to check blood sugar at least twice daily. E11.8 E11.65 Z79.4 (Patient taking differently: 1 each by Other route See admin instructions. Use as directed to check blood sugar at least twice daily. E11.8 E11.65 Z79.4), Disp: 102 each, Rfl: 11 .  apixaban (ELIQUIS) 5 MG TABS tablet, Take 1 tablet (5 mg total) by mouth 2 (two) times daily., Disp: 60 tablet, Rfl: 6 .  atorvastatin (LIPITOR) 20 MG tablet, Take 1 tablet (20 mg total) by mouth daily., Disp: 90 tablet, Rfl: 1 .  Blood Glucose Monitoring Suppl (ACCU-CHEK AVIVA)  device, Use as instructed to check blood sugar 2 times daily. E11.8 E11.65 Z79.4 (Patient taking differently: 1 each by Other route See admin instructions. Use as instructed to check blood sugar 2 times daily. E11.8 E11.65 Z79.4), Disp: 1 each, Rfl: 0 .  ciprofloxacin (CIPRO) 500 MG tablet, Take 1 tablet (500 mg total) by mouth 2 (two) times daily., Disp: 20 tablet, Rfl: 0 .  gabapentin (NEURONTIN) 300 MG capsule, Take 1 capsule (300 mg total) by mouth 2 (two) times daily. (Patient taking differently: Take 600 mg by mouth at bedtime as needed (pain). ), Disp: 180 capsule, Rfl: 1 .  glucose blood (ACCU-CHEK AVIVA) test strip, Use as instructed to check blood sugar 2 times daily. E11.8 E11.65 Z79.4 (Patient taking differently: 1 each by Other route See admin instructions. Use as instructed to check blood sugar 2 times daily. E11.8 E11.65 Z79.4), Disp: 100 each, Rfl: 12 .  insulin glargine (LANTUS SOLOSTAR) 100 UNIT/ML Solostar Pen, Inject 50 Units into the skin 2 (two) times daily. (Patient taking differently: Inject 51 Units into the skin 2 (two) times daily. ), Disp: 30 mL, Rfl: 6 .  insulin lispro (INSULIN LISPRO) 100 UNIT/ML KwikPen Junior, Inject 0.08 mLs (8 Units total) into the skin 3 (three) times daily. (Patient taking differently: Inject 8 Units into the skin 3 (three) times daily as needed (Low  blood sugar). ), Disp: 30 mL, Rfl: 6 .  Insulin Pen Needle (B-D ULTRAFINE III SHORT PEN) 31G X 8 MM MISC, 1 each by Does not apply route 3 (three) times daily., Disp: 100 each, Rfl: 5 .  Insulin Syringe-Needle U-100 (TRUEPLUS INSULIN SYRINGE) 30G X 5/16" 0.5 ML MISC, Use as directed 3 times daily (Patient taking differently: 1 each by Other route See admin instructions. Use as directed 3 times daily), Disp: 100 each, Rfl: 5 .  liraglutide (VICTOZA) 18 MG/3ML SOPN, Inject 0.3 mLs (1.8 mg total) into the skin daily., Disp: 30 mL, Rfl: 6 .  oxyCODONE-acetaminophen (PERCOCET) 10-325 MG tablet, Take 1 tablet by  mouth 4 (four) times daily. , Disp: , Rfl:  .  polyethylene glycol (MIRALAX / GLYCOLAX) 17 g packet, Take 17 g by mouth daily as needed for mild constipation., Disp: 14 each, Rfl: 0 .  Sennosides (EX-LAX) 15 MG TABS, Take 15 mg by mouth daily as needed (Constipation)., Disp: , Rfl:  .  sodium chloride (OCEAN) 0.65 % SOLN nasal spray, Place 1 spray into both nostrils daily as needed for congestion. , Disp: , Rfl:  .  TRUEPLUS INSULIN SYRINGE 30G X 5/16" 0.5 ML MISC, USE AS DIRECTED 3 TIMES DAILY (Patient taking differently: 1 each by Other route in the morning, at noon, and at bedtime. ), Disp: 100 each, Rfl: 0 .  amiodarone (PACERONE) 100 MG tablet, Take 100 mg by mouth 2 (two) times daily., Disp: , Rfl:  .  furosemide (LASIX) 40 MG tablet, Take 1 tablet (40 mg total) by mouth daily., Disp: 90 tablet, Rfl: 3 .  metoprolol tartrate (LOPRESSOR) 100 MG tablet, Take 0.5 tablets (50 mg total) by mouth 2 (two) times daily., Disp: 90 tablet, Rfl: 2  Social History   Tobacco Use  Smoking Status Former Smoker  . Packs/day: 0.00  . Years: 38.00  . Pack years: 0.00  . Types: Cigarettes  Smokeless Tobacco Never Used  Tobacco Comment   QUIT 10/2016    Allergies  Allergen Reactions  . Metformin And Related Other (See Comments)    GI upset   Objective:  There were no vitals filed for this visit. There is no height or weight on file to calculate BMI. Constitutional Well developed. Well nourished.  Vascular Dorsalis pedis pulses palpable bilaterally. Posterior tibial pulses palpable bilaterally. Capillary refill normal to all digits.  No cyanosis or clubbing noted. Pedal hair growth normal.  Neurologic Normal speech. Oriented to person, place, and time. Epicritic sensation to light touch grossly present bilaterally.  Dermatologic Pitting edema present bilaterally and there is clear drainage on the left and right leg today.  There is no purulence.  There is no erythema associated with the  swelling.  There is no fluctuation or crepitation.  On the right side status post incision and drainage, wound excision with graft intact.  I remove the overlying Adaptic and the graft appears to be taking.  There is no purulence.  Minimal erythema but no significant warmth.  There is no ascending cellulitis.  There is no fluctuation or crepitation any malodor. No open lesions or pre-ulcerative lesions.  No pain with calf compression, swelling, warmth, erythema  Orthopedic: Normal joint ROM without pain or crepitus bilaterally. No visible deformities. No bony tenderness.   Radiographs: None Assessment:   1. Cellulitis of right lower leg   2. Diabetic ulcer of right foot associated with type 2 diabetes mellitus, with fat layer exposed, unspecified part of foot ([place])  3. Leg swelling    Plan:  Patient was evaluated and treated and all questions answered.  59 year old male with bilateral pitting edema, leg drainage; right foot status post incision and drainage, graft application -Patient overall is doing well.  The graft and the wound site to the right lateral foot appears to be taking well.  No clinical signs of infection noted.  Overall the graft has a good take.  Good granular with small amount of fibrotic tissue noted.  No malodor present. Cam boot was dispensed to provide mobility as well as offloading to the light lateral wound. -I discussed with the patient the etiology of bilateral lower extremity edema and various treatment options were discussed.  Patient will be scheduled see the cardiologist for fluid management. -His dressing was changed with application of Xeroform to all the wounds as well as compression bandaging to help with the swelling.  I discussed with the patient Scientist, research (life sciences).  Patient states he will work on doing so. -He will be scheduled see Dr. Jacqualyn [name] this week. No follow-ups on file.

## 2019-08-31 ENCOUNTER — Ambulatory Visit (INDEPENDENT_AMBULATORY_CARE_PROVIDER_SITE_OTHER): Payer: Medicaid Other | Admitting: Podiatry

## 2019-08-31 ENCOUNTER — Other Ambulatory Visit: Payer: Self-pay

## 2019-08-31 ENCOUNTER — Telehealth: Payer: Self-pay | Admitting: *Deleted

## 2019-08-31 ENCOUNTER — Encounter: Payer: Self-pay | Admitting: Podiatry

## 2019-08-31 DIAGNOSIS — M7989 Other specified soft tissue disorders: Secondary | ICD-10-CM | POA: Diagnosis not present

## 2019-08-31 DIAGNOSIS — E11621 Type 2 diabetes mellitus with foot ulcer: Secondary | ICD-10-CM

## 2019-08-31 DIAGNOSIS — L97909 Non-pressure chronic ulcer of unspecified part of unspecified lower leg with unspecified severity: Secondary | ICD-10-CM

## 2019-08-31 DIAGNOSIS — L97512 Non-pressure chronic ulcer of other part of right foot with fat layer exposed: Secondary | ICD-10-CM

## 2019-08-31 DIAGNOSIS — I83009 Varicose veins of unspecified lower extremity with ulcer of unspecified site: Secondary | ICD-10-CM

## 2019-08-31 NOTE — Telephone Encounter (Signed)
Faxed required form, clinicals and demographics to [facility]. 

## 2019-08-31 NOTE — Telephone Encounter (Signed)
-----   Message from Trula Slade, DPM sent at 08/30/2019  4:10 PM EDT ----- Can we try to arrange home health care for him?  Dressing orders are below. RIGHT: On the dorsal lateral graft site apply Adaptic followed by petroleum jelly followed by 4 x 4's.    I would like to wrap the foot and leg bilaterally with 4x4 to any wound then kerlix, followed by coban.

## 2019-08-31 NOTE — Progress Notes (Signed)
Subjective:  Patient ID: Jeffery [name], male    DOB: May 21, 1960,  MRN: [id number]  Chief Complaint  Patient presents with  . Diabetic Ulcer    Bilateral plantar forefoot + bilateral cellulitis. Pt stated, "It's terrible - the edema makes me feel like my legs are going to split open. Severe pain, even with Percocet. I'm wrapping in gauze and using Ace bandages". Pt has been having chills, but no fever. No N&V. R leg is warm to the touch. L leg is draining significantly more than the R, but it is not warm to the touch.    59 y.o. male presents with the above complaint.  Patient presents with a follow-up of bilateral lower extremity lymphedema as well as Unna boot applications to bilateral lower extremity.  He was not able to see Dr. Earleen Newport today.  He will be scheduled to see with him next week.  He states that he is doing overall pretty well.  He denies any fever chills nausea vomiting.  He has constant drainage of fluid from the lower extremity.  Patient scheduled see cardiologist today.  He has been taking his antibiotics.  He mentioned that he would like a boot to help support the right leg when he is walking.  He would like to know if he could get a cam boot.  Review of Systems: Negative except as noted in the HPI. Denies N/V/F/Ch.  Past Medical History:  Diagnosis Date  . Atrial fibrillation ([place])   . Cellulitis and abscess of foot 07/2019   RIGHT FOOT  . Charcot's joint of right foot   . DDD (degenerative disc disease), lumbar   . Diabetes mellitus without complication ([place])   . Fatty liver   . Hypertension   . Obesity   . Rotator cuff disorder   . Shoulder impingement, right   . Spinal stenosis at L4-L5 level     Current Outpatient Medications:  .  Accu-Chek FastClix Lancets MISC, Use as directed to check blood sugar at least twice daily. E11.8 E11.65 Z79.4 (Patient taking differently: 1 each by Other route See admin instructions. Use as directed to check blood sugar at  least twice daily. E11.8 E11.65 Z79.4), Disp: 102 each, Rfl: 11 .  amiodarone (PACERONE) 100 MG tablet, Take 100 mg by mouth 2 (two) times daily., Disp: , Rfl:  .  apixaban (ELIQUIS) 5 MG TABS tablet, Take 1 tablet (5 mg total) by mouth 2 (two) times daily., Disp: 60 tablet, Rfl: 6 .  atorvastatin (LIPITOR) 20 MG tablet, Take 1 tablet (20 mg total) by mouth daily., Disp: 90 tablet, Rfl: 1 .  Blood Glucose Monitoring Suppl (ACCU-CHEK AVIVA) device, Use as instructed to check blood sugar 2 times daily. E11.8 E11.65 Z79.4 (Patient taking differently: 1 each by Other route See admin instructions. Use as instructed to check blood sugar 2 times daily. E11.8 E11.65 Z79.4), Disp: 1 each, Rfl: 0 .  ciprofloxacin (CIPRO) 500 MG tablet, Take 1 tablet (500 mg total) by mouth 2 (two) times daily., Disp: 20 tablet, Rfl: 0 .  furosemide (LASIX) 40 MG tablet, Take 1 tablet (40 mg total) by mouth daily., Disp: 90 tablet, Rfl: 3 .  gabapentin (NEURONTIN) 300 MG capsule, Take 1 capsule (300 mg total) by mouth 2 (two) times daily. (Patient taking differently: Take 600 mg by mouth at bedtime as needed (pain). ), Disp: 180 capsule, Rfl: 1 .  glucose blood (ACCU-CHEK AVIVA) test strip, Use as instructed to check blood sugar 2 times daily. E11.8  E11.65 Z79.4 (Patient taking differently: 1 each by Other route See admin instructions. Use as instructed to check blood sugar 2 times daily. E11.8 E11.65 Z79.4), Disp: 100 each, Rfl: 12 .  insulin glargine (LANTUS SOLOSTAR) 100 UNIT/ML Solostar Pen, Inject 50 Units into the skin 2 (two) times daily. (Patient taking differently: Inject 51 Units into the skin 2 (two) times daily. ), Disp: 30 mL, Rfl: 6 .  insulin lispro (INSULIN LISPRO) 100 UNIT/ML KwikPen Junior, Inject 0.08 mLs (8 Units total) into the skin 3 (three) times daily. (Patient taking differently: Inject 8 Units into the skin 3 (three) times daily as needed (Low blood sugar). ), Disp: 30 mL, Rfl: 6 .  Insulin Pen Needle (B-D  ULTRAFINE III SHORT PEN) 31G X 8 MM MISC, 1 each by Does not apply route 3 (three) times daily., Disp: 100 each, Rfl: 5 .  Insulin Syringe-Needle U-100 (TRUEPLUS INSULIN SYRINGE) 30G X 5/16" 0.5 ML MISC, Use as directed 3 times daily (Patient taking differently: 1 each by Other route See admin instructions. Use as directed 3 times daily), Disp: 100 each, Rfl: 5 .  liraglutide (VICTOZA) 18 MG/3ML SOPN, Inject 0.3 mLs (1.8 mg total) into the skin daily., Disp: 30 mL, Rfl: 6 .  metoprolol tartrate (LOPRESSOR) 100 MG tablet, Take 0.5 tablets (50 mg total) by mouth 2 (two) times daily., Disp: 90 tablet, Rfl: 2 .  oxyCODONE-acetaminophen (PERCOCET) 10-325 MG tablet, Take 1 tablet by mouth 4 (four) times daily. , Disp: , Rfl:  .  polyethylene glycol (MIRALAX / GLYCOLAX) 17 g packet, Take 17 g by mouth daily as needed for mild constipation., Disp: 14 each, Rfl: 0 .  Sennosides (EX-LAX) 15 MG TABS, Take 15 mg by mouth daily as needed (Constipation)., Disp: , Rfl:  .  sodium chloride (OCEAN) 0.65 % SOLN nasal spray, Place 1 spray into both nostrils daily as needed for congestion. , Disp: , Rfl:  .  TRUEPLUS INSULIN SYRINGE 30G X 5/16" 0.5 ML MISC, USE AS DIRECTED 3 TIMES DAILY (Patient taking differently: 1 each by Other route in the morning, at noon, and at bedtime. ), Disp: 100 each, Rfl: 0  Social History   Tobacco Use  Smoking Status Former Smoker  . Packs/day: 0.00  . Years: 38.00  . Pack years: 0.00  . Types: Cigarettes  Smokeless Tobacco Never Used  Tobacco Comment   QUIT 10/2016    Allergies  Allergen Reactions  . Metformin And Related Other (See Comments)    GI upset   Objective:   Vitals:   08/25/19 0943  Temp: 97.6 F (36.4 C)   There is no height or weight on file to calculate BMI. Constitutional Well developed. Well nourished.  Vascular Dorsalis pedis pulses palpable bilaterally. Posterior tibial pulses palpable bilaterally. Capillary refill normal to all digits.  No  cyanosis or clubbing noted. Pedal hair growth normal.  Neurologic Normal speech. Oriented to person, place, and time. Epicritic sensation to light touch grossly present bilaterally.  Dermatologic Pitting edema present bilaterally and there is clear drainage on the left and right leg today.  There is no purulence.  There is no erythema associated with the swelling.  There is no fluctuation or crepitation.  On the right side status post incision and drainage, wound excision with graft intact.  I remove the overlying Adaptic and the graft appears to be taking.  There is no purulence.  Minimal erythema but no significant warmth.  There is no ascending cellulitis.  There is  no fluctuation or crepitation any malodor. No open lesions or pre-ulcerative lesions.  No pain with calf compression, swelling, warmth, erythema  Orthopedic: Normal joint ROM without pain or crepitus bilaterally. No visible deformities. No bony tenderness.   Radiographs: None Assessment:   No diagnosis found. Plan:  Patient was evaluated and treated and all questions answered.  59 year old male with bilateral pitting edema, leg drainage; right foot status post incision and drainage, graft application -Patient overall is doing well.  The graft and the wound site to the right lateral foot appears to be taking well.  No clinical signs of infection noted.  Overall the graft has a good take.  Good granular with small amount of fibrotic tissue noted.  No malodor present. Cam boot was dispensed to provide mobility as well as offloading to the light lateral wound. -I discussed with the patient the etiology of bilateral lower extremity edema and various treatment options were discussed.  Patient will be scheduled see the cardiologist for fluid management. -His dressing was changed with application of Xeroform to all the wounds as well as compression bandaging to help with the swelling.  I discussed with the patient Scientist, research (life sciences).   Patient states he will work on doing so. -He will be scheduled see Dr. Jacqualyn Posey this week. Return for Foot Ulcer.

## 2019-09-01 LAB — FUNGUS CULTURE WITH STAIN

## 2019-09-01 LAB — FUNGUS CULTURE RESULT

## 2019-09-01 LAB — FUNGAL ORGANISM REFLEX

## 2019-09-04 ENCOUNTER — Telehealth: Payer: Self-pay | Admitting: *Deleted

## 2019-09-04 ENCOUNTER — Other Ambulatory Visit: Payer: Self-pay

## 2019-09-04 DIAGNOSIS — E0590 Thyrotoxicosis, unspecified without thyrotoxic crisis or storm: Secondary | ICD-10-CM

## 2019-09-04 DIAGNOSIS — I480 Paroxysmal atrial fibrillation: Secondary | ICD-10-CM

## 2019-09-04 DIAGNOSIS — M5412 Radiculopathy, cervical region: Secondary | ICD-10-CM | POA: Diagnosis not present

## 2019-09-04 DIAGNOSIS — Z79899 Other long term (current) drug therapy: Secondary | ICD-10-CM | POA: Diagnosis not present

## 2019-09-04 DIAGNOSIS — G894 Chronic pain syndrome: Secondary | ICD-10-CM | POA: Diagnosis not present

## 2019-09-04 DIAGNOSIS — M542 Cervicalgia: Secondary | ICD-10-CM | POA: Diagnosis not present

## 2019-09-04 DIAGNOSIS — M545 Low back pain: Secondary | ICD-10-CM | POA: Diagnosis not present

## 2019-09-04 NOTE — Telephone Encounter (Signed)
Pt states he gave blood today and he had blood in his urine, because his urine is pink. Pt asked if Dr. Jacqualyn Posey would give him an antibiotic. I told pt he would benefit from contacting his PCP and having her check him or go to ED or Urgent Care.

## 2019-09-04 NOTE — Telephone Encounter (Signed)
Pt states it is running pink, red urine.

## 2019-09-04 NOTE — Telephone Encounter (Signed)
Thanks. Jeffery [name] spoke to him as well.

## 2019-09-05 ENCOUNTER — Other Ambulatory Visit: Payer: Self-pay

## 2019-09-05 ENCOUNTER — Ambulatory Visit: Payer: Medicaid Other | Admitting: Podiatry

## 2019-09-05 ENCOUNTER — Ambulatory Visit (INDEPENDENT_AMBULATORY_CARE_PROVIDER_SITE_OTHER): Payer: Medicaid Other | Admitting: Physician Assistant

## 2019-09-05 ENCOUNTER — Encounter: Payer: Self-pay | Admitting: Podiatry

## 2019-09-05 ENCOUNTER — Encounter: Payer: Self-pay | Admitting: Physician Assistant

## 2019-09-05 ENCOUNTER — Telehealth: Payer: Self-pay | Admitting: *Deleted

## 2019-09-05 VITALS — Temp 98.0°F

## 2019-09-05 VITALS — BP 134/82 | HR 61 | Temp 97.0°F | Ht 74.0 in | Wt 372.0 lb

## 2019-09-05 DIAGNOSIS — I83009 Varicose veins of unspecified lower extremity with ulcer of unspecified site: Secondary | ICD-10-CM

## 2019-09-05 DIAGNOSIS — R600 Localized edema: Secondary | ICD-10-CM | POA: Diagnosis not present

## 2019-09-05 DIAGNOSIS — L97909 Non-pressure chronic ulcer of unspecified part of unspecified lower leg with unspecified severity: Secondary | ICD-10-CM

## 2019-09-05 DIAGNOSIS — L97512 Non-pressure chronic ulcer of other part of right foot with fat layer exposed: Secondary | ICD-10-CM

## 2019-09-05 DIAGNOSIS — I10 Essential (primary) hypertension: Secondary | ICD-10-CM

## 2019-09-05 DIAGNOSIS — I480 Paroxysmal atrial fibrillation: Secondary | ICD-10-CM

## 2019-09-05 DIAGNOSIS — N183 Chronic kidney disease, stage 3 unspecified: Secondary | ICD-10-CM | POA: Diagnosis not present

## 2019-09-05 DIAGNOSIS — L97511 Non-pressure chronic ulcer of other part of right foot limited to breakdown of skin: Secondary | ICD-10-CM

## 2019-09-05 DIAGNOSIS — L03119 Cellulitis of unspecified part of limb: Secondary | ICD-10-CM

## 2019-09-05 DIAGNOSIS — E875 Hyperkalemia: Secondary | ICD-10-CM

## 2019-09-05 DIAGNOSIS — E119 Type 2 diabetes mellitus without complications: Secondary | ICD-10-CM

## 2019-09-05 DIAGNOSIS — M7989 Other specified soft tissue disorders: Secondary | ICD-10-CM

## 2019-09-05 DIAGNOSIS — E11621 Type 2 diabetes mellitus with foot ulcer: Secondary | ICD-10-CM

## 2019-09-05 DIAGNOSIS — R319 Hematuria, unspecified: Secondary | ICD-10-CM

## 2019-09-05 LAB — BASIC METABOLIC PANEL
BUN/Creatinine Ratio: 20 (ref 9–20)
BUN: 32 mg/dL — ABNORMAL HIGH (ref 6–24)
CO2: 20 mmol/L (ref 20–29)
Calcium: 8.3 mg/dL — ABNORMAL LOW (ref 8.7–10.2)
Chloride: 103 mmol/L (ref 96–106)
Creatinine, Ser: 1.63 mg/dL — ABNORMAL HIGH (ref 0.76–1.27)
GFR calc Af Amer: 53 mL/min/{1.73_m2} — ABNORMAL LOW (ref >59)
GFR calc non Af Amer: 46 mL/min/{1.73_m2} — ABNORMAL LOW (ref >59)
Glucose: 158 mg/dL — ABNORMAL HIGH (ref 65–99)
Potassium: 6.1 mmol/L (ref 3.5–5.2)
Sodium: 138 mmol/L (ref 134–144)

## 2019-09-05 LAB — T4, FREE: Free T4: 1.46 ng/dL (ref 0.82–1.77)

## 2019-09-05 LAB — TSH: TSH: 6.7 u[IU]/mL — ABNORMAL HIGH (ref 0.450–4.500)

## 2019-09-05 MED ORDER — DOXYCYCLINE HYCLATE 100 MG PO TABS
100.0000 mg | ORAL_TABLET | Freq: Two times a day (BID) | ORAL | 0 refills | Status: DC
Start: 2019-09-05 — End: 2019-09-28

## 2019-09-05 NOTE — Progress Notes (Signed)
Subjective: Jeffery [name] is a 59 y.o. is seen today in office s/p right foot wound excision, debridement Acell application preformed on 08/14/2019.  Presents today for dressing change.  He states the right leg is been doing better but still having quite a bit of drainage on the left leg.  Still having swelling.  He is following up with cardiology next week on Tuesday to address his medications.  He states he feels tired but overall no other concerns.  Denies any chest pain, shortness of breath currently.  Denies any fevers, chills, nausea, vomiting.   Objective: General: No acute distress, AAOx3  DP/PT pulses palpable 2/4, CRT < 3 sec to all digits.  RIGHT foot: When the dorsal lateral aspect the foot with graft intact.  There is no fluctuation crepitation there is no drainage.  Submetatarsal 1 wound is healed with some minimal hyperkeratotic tissue.  Mild hyperkeratotic tissue over the area.  There is edema to the leg however improved and there is no significant drainage but there is one blister posterior leg. LEFT: Edema present to bilateral lower extremities and pitting with the left side >> right.  Significant clear drainage coming from the left leg with multiple blisters and superficial areas of skin breakdown.  There is no purulence.  Mild chronic erythema bilaterally likely result of swelling.  No pain with calf compression, warmth, erythema.     Assessment and Plan:  Status post left foot wound excision, debridement with bilateral lower extremity edema/blisters  -Treatment options discussed including all alternatives, risks, and complications -Wound continues to incorporate in the right side is been doing well however concerned about the leg swelling as well as drainage on the left side.  Today he was rewrapped.  Adaptic and petroleum jelly applied to the graft on the right side.  Betadine was painted to the wounds by 4 x 4's, Kerlix to the foot, leg followed by Coflex and Ace bandages.   Encouraged elevation.  He is to come to the office on Tuesday for dressing change.  We are trying to set up home health care for him as well. -Pain medication as needed. -Monitor for any clinical signs or symptoms of infection and DVT/PE and directed to call the office immediately should any occur or go to the ER. -Follow-up as scheduled for dressing change or sooner if any problems arise. In the meantime, encouraged to call the office with any questions, concerns, change in symptoms.   Celesta Gentile, DPM

## 2019-09-05 NOTE — Patient Instructions (Signed)
Medication Instructions:   Starting Thursday 09/07/19 INCREASE Lasix to 80 mg daily for 3 days then resume on day 4 Lasix 40 mg daily  *If you need a refill on your cardiac medications before your next appointment, please call your pharmacy*  Lab Work: Your physician recommends that you return for lab work Thursday 06/10/21and again in 2 weeks on follow up appointment:   BMET  If you have labs (blood work) drawn today and your tests are completely normal, you will receive your results only by: Marland Kitchen MyChart Message (if you have MyChart) OR . A paper copy in the mail If you have any lab test that is abnormal or we need to change your treatment, we will call you to review the results.  Testing/Procedures: NONE ordered at this time of appointment   Follow-Up: At [facility], you and your health needs are our priority.  As part of our continuing mission to provide you with exceptional heart care, we have created designated Provider Care Teams.  These Care Teams include your primary Cardiologist (physician) and Advanced Practice Providers (APPs -  Physician Assistants and Nurse Practitioners) who all work together to provide you with the care you need, when you need it.  We recommend signing up for the patient portal called "MyChart".  Sign up information is provided on this After Visit Summary.  MyChart is used to connect with patients for Virtual Visits (Telemedicine).  Patients are able to view lab/test results, encounter notes, upcoming appointments, etc.  Non-urgent messages can be sent to your provider as well.   To learn more about what you can do with MyChart, go to [email or web address].    Your next appointment:   2 week(s)  The format for your next appointment:   In Person  Provider:   Almyra Deforest, PA-C  Other Instructions

## 2019-09-05 NOTE — Telephone Encounter (Signed)
Dr. Jacqualyn Posey states pt is to see [facility] today, place referrals to Wound Care and Urology. Faxed required form, clinicals current to 1st Op Note 08/14/2019, MRI of right foot and demographics to wound [place]. Faxed referral, 06/08/02021 clinicals and labs of 09/04/2019 and demographics to [facility].

## 2019-09-05 NOTE — Telephone Encounter (Signed)
Dr. Jacqualyn Posey request a call to Dr. Margarita Rana, stating pt has had difficulty contacting office for an appt. Dr. Jacqualyn Posey states he has not been able to get in touch with anyone within [place] to assist in scheduling pt. I spoke with [place] and [facility] and she transferred to [place], then transferred back to [place]. I informed Clyde Canterbury of Dr. Leigh Aurora request to get pt in sooner or today and explained the symptoms, Calton Dach in the background states pt is scheduled for 09/18/2019, and I asked Clyde Canterbury if that was [facility] and she stated yes. I again explained the pt's current health status to [place] stated that is the earliest they have with Dr. Margarita Rana and stated refer pt to ED or Urgent Care.

## 2019-09-05 NOTE — Progress Notes (Signed)
Lab resulted around 3AM today. Stable renal function, potassium increased further, now is critically high. It does not appears I have seen Mr. Gorr in the past but he is scheduled to see me later today. I spoke with Raquel, our clinical pharmacist, unclear reason why his potassium is increasing, however this trend is consistent with the last labwork. Patient will be given Lokel today and tomorrow to try to bring the potassium down and repeat lab in 2 days. Also please tell the patient to bring all of his medication bottles, including supplement, to today's visit.

## 2019-09-05 NOTE — Progress Notes (Signed)
Tsh remain elevated, free T4 normal, likely subclinical hypothyroidism. Please send the lab results to PCP to help manage the thyroid. Unclear if this is related to amiodarone therapy. See prior note regarding his critical hyperkalemia, plan to treat with [facility] when he arrive for appt today

## 2019-09-05 NOTE — Telephone Encounter (Signed)
I spoke with [place] and she states they are having the fax machine worked on and requested the referral be sent by her [employer]. Emailed referral to [place].

## 2019-09-05 NOTE — Progress Notes (Signed)
Cardiology Office Note:    Date:  09/07/2019   ID:  Jeffery [name], DOB Dec 22, 1960, MRN [id number]  PCP:  [name], MD  [facility] Cardiologist:  Shelva Majestic, MD  [facility] Electrophysiologist:  Will Meredith Leeds, MD   Referring MD: [name], MD   Chief Complaint  Patient presents with  . Follow-up    leg edema and hyperkalemia    History of Present Illness:    Jeffery [name] is a 59 y.o. male with a hx of obesity, DM 2, hypertension, paroxysmal atrial fibrillation, EtOH abuse, Charcot's foot and chronic pain syndrome.  He has significant EtOH history.  He has previously underwent a successful cardioversion.  Echocardiogram obtained in 2018 showed EF 50 to 55% with grade 1 DD, moderate LAE and trivial MR.  He was doing well until he had recurrence of atrial fibrillation due to alcohol abuse.  He ended up having a second cardioversion in 2018.  Myoview in August 2019 was normal.  Echocardiogram at that time showed EF 55 to 60%, grade 1 DD. Due to recurrence of atrial fibrillation, he was eventually started amiodarone therapy.  He had recurrent atrial fibrillation of unknown duration in November 2020.  Repeat echocardiogram in December 2020 showed EF 50 to 55%, moderate LVH, mild LAE, mild MR and mild TR.  Dr. Claiborne Billings previously recommended sleep study however he did not wish to proceed.  He had failed attempt at cardioversion in January 2021 and was subsequently referred to EP service.  He was seen by Dr. Curt Bears on 05/15/2019 who recommended repeat cardioversion.  He eventually underwent successful cardioversion and a 362 on 05/18/2019.  He was seen by Dr. Claiborne Billings in March at which time he was doing well.  Patient was admitted in May 2021 to the hospital due to severe sepsis with MSSA secondary to foot ulcer.  He eventually underwent debridement.  During the hospitalization, he had AKI and hyperkalemia.  Nephrology saw the patient in consult and recommended  follow-up on the lab work as outpatient.  Home Lopressor was switched to Toprol-XL and ARB was stopped secondary to hyperkalemia and AKI.  Since discharge, he has had volume overload and was placed on 80 mg daily of Lasix.   Patient presents today for evaluation.  He continues to have significant lower extremity edema.  Lab work obtained yesterday showed severe hyperkalemia with potassium of 6.1.  I discussed the case with our clinical pharmacist who recommended 2 days of Lokelma to help lower the potassium.  As for his lower extremity edema, I still feel he is volume overloaded however recommended increasing Lasix to 80 mg daily starting this Thursday for 3 days before going back down to 40 mg daily thereafter.  The reason I instructed her him to start on the higher dose of Lasix this Thursday is to avoid interacting with [facility] which may drop his potassium too low.  Otherwise he denies any obvious chest discomfort.   Past Medical History:  Diagnosis Date  . Atrial fibrillation ([place])   . Cellulitis and abscess of foot 07/2019   RIGHT FOOT  . Charcot's joint of right foot   . DDD (degenerative disc disease), lumbar   . Diabetes mellitus without complication ([place])   . Fatty liver   . Hypertension   . Obesity   . Rotator cuff disorder   . Shoulder impingement, right   . Spinal stenosis at L4-L5 level     Past Surgical History:  Procedure Laterality Date  .  AMPUTATION Left 10/02/2014   Procedure: Left Third toe amputation ;  Surgeon: [name], MD;  Location: [place];  Service: Orthopedics;  Laterality: Left;  Regular bed, wants to follow hip  . ANTERIOR CRUCIATE LIGAMENT REPAIR Right 90   reconstruction  . APPLICATION OF WOUND VAC Left 10/02/2014   Procedure: APPLICATION OF WOUND VAC; toe Surgeon: [name], MD;  Location: [place];  Service: Orthopedics;  Laterality: Left;  . CARDIOVERSION N/A 04/26/2019   Procedure: CARDIOVERSION;  Surgeon: Pixie Casino, MD;  Location: [facility] ENDOSCOPY;   Service: Cardiovascular;  Laterality: N/A;  . CARDIOVERSION N/A 05/18/2019   Procedure: CARDIOVERSION (CATH LAB);  Surgeon: Constance Haw, MD;  Location: [place] CV LAB;  Service: Cardiovascular;  Laterality: N/A;  . I & D EXTREMITY Left 10/05/2014   Procedure: IRRIGATION AND DEBRIDEMENT LEFT FOOT;  Surgeon: [name], MD;  Location: [place];  Service: Orthopedics;  Laterality: Left;  . INCISION AND DRAINAGE Right 08/09/2019   Procedure: INCISION AND DRAINAGE RIGHT FOOT;  Surgeon: Trula Slade, DPM;  Location: [place];  Service: Podiatry;  Laterality: Right;  . KNEE ARTHROSCOPY W/ ACL RECONSTRUCTION Right   . TOTAL KNEE ARTHROPLASTY Right 03/28/2015  . TOTAL KNEE ARTHROPLASTY Right 03/28/2015   Procedure: RIGHT TOTAL KNEE ARTHROPLASTY;  Surgeon: [name], MD;  Location: [place];  Service: Orthopedics;  Laterality: Right;  . WOUND DEBRIDEMENT Right 08/14/2019   Procedure: DEBRIDEMENT WOUND;  Surgeon: Trula Slade, DPM;  Location: [place];  Service: Podiatry;  Laterality: Right;    Current Medications: Current Meds  Medication Sig  . Accu-Chek FastClix Lancets MISC Use as directed to check blood sugar at least twice daily. E11.8 E11.65 Z79.4 (Patient taking differently: 1 each by Other route See admin instructions. Use as directed to check blood sugar at least twice daily. E11.8 E11.65 Z79.4)  . amiodarone (PACERONE) 200 MG tablet Take 200 mg by mouth 2 (two) times daily.   Marland Kitchen apixaban (ELIQUIS) 5 MG TABS tablet Take 1 tablet (5 mg total) by mouth 2 (two) times daily.  Marland Kitchen atorvastatin (LIPITOR) 20 MG tablet Take 1 tablet (20 mg total) by mouth daily.  . Blood Glucose Monitoring Suppl (ACCU-CHEK AVIVA) device Use as instructed to check blood sugar 2 times daily. E11.8 E11.65 Z79.4 (Patient taking differently: 1 each by Other route See admin instructions. Use as instructed to check blood sugar 2 times daily. E11.8 E11.65 Z79.4)  . doxycycline (VIBRA-TABS) 100 MG tablet Take 1 tablet  (100 mg total) by mouth 2 (two) times daily.  . furosemide (LASIX) 40 MG tablet Take 1 tablet (40 mg total) by mouth daily. (Patient taking differently: Take 40-80 mg by mouth See admin instructions. Per MD pt started taking 80mg  09/06/19 x3days then he is to resume back to 40mg  QD)  . gabapentin (NEURONTIN) 300 MG capsule Take 1 capsule (300 mg total) by mouth 2 (two) times daily. (Patient taking differently: Take 600 mg by mouth at bedtime as needed (pain). )  . glucose blood (ACCU-CHEK AVIVA) test strip Use as instructed to check blood sugar 2 times daily. E11.8 E11.65 Z79.4 (Patient taking differently: 1 each by Other route See admin instructions. Use as instructed to check blood sugar 2 times daily. E11.8 E11.65 Z79.4)  . insulin glargine (LANTUS SOLOSTAR) 100 UNIT/ML Solostar Pen Inject 50 Units into the skin 2 (two) times daily. (Patient taking differently: Inject 51 Units into the skin 2 (two) times daily. )  . insulin  lispro (INSULIN LISPRO) 100 UNIT/ML KwikPen Junior Inject 0.08 mLs (8 Units total) into the skin 3 (three) times daily. (Patient taking differently: Inject 8 Units into the skin 3 (three) times daily as needed (Low blood sugar). )  . Insulin Pen Needle (B-D ULTRAFINE III SHORT PEN) 31G X 8 MM MISC 1 each by Does not apply route 3 (three) times daily.  . Insulin Syringe-Needle U-100 (TRUEPLUS INSULIN SYRINGE) 30G X 5/16" 0.5 ML MISC Use as directed 3 times daily (Patient taking differently: 1 each by Other route See admin instructions. Use as directed 3 times daily)  . liraglutide (VICTOZA) 18 MG/3ML SOPN Inject 0.3 mLs (1.8 mg total) into the skin daily. (Patient not taking: Reported on 09/07/2019)  . metoprolol tartrate (LOPRESSOR) 100 MG tablet Take 0.5 tablets (50 mg total) by mouth 2 (two) times daily.  Marland Kitchen oxyCODONE-acetaminophen (PERCOCET) 10-325 MG tablet Take 1 tablet by mouth every 4 (four) hours as needed for pain.   . polyethylene glycol (MIRALAX / GLYCOLAX) 17 g packet Take  17 g by mouth daily as needed for mild constipation.  . Sennosides (EX-LAX) 15 MG TABS Take 15 mg by mouth daily as needed (Constipation).  . sodium chloride (OCEAN) 0.65 % SOLN nasal spray Place 1 spray into both nostrils daily as needed for congestion.   Karen Chafe INSULIN SYRINGE 30G X 5/16" 0.5 ML MISC USE AS DIRECTED 3 TIMES DAILY (Patient taking differently: 1 each by Other route in the morning, at noon, and at bedtime. )     Allergies:   Metformin and related   Social History   Socioeconomic History  . Marital status: Single    Spouse name: Not on file  . Number of children: Not on file  . Years of education: Not on file  . Highest education level: Not on file  Occupational History  . Occupation: Management consultant  Tobacco Use  . Smoking status: Former Smoker    Packs/day: 0.00    Years: 38.00    Pack years: 0.00    Types: Cigarettes  . Smokeless tobacco: Never Used  . Tobacco comment: QUIT 10/2016  Vaping Use  . Vaping Use: Never used  Substance and Sexual Activity  . Alcohol use: No    Alcohol/week: 5.0 - 6.0 standard drinks    Types: 5 - 6 Cans of beer per week  . Drug use: Yes    Types: Marijuana    Comment: last week ; denies 03/24/17  . Sexual activity: Not on file  Other Topics Concern  . Not on file  Social History Narrative   Lives alone.   Social Determinants of Health   Financial Resource Strain:   . Difficulty of Paying Living Expenses:   Food Insecurity:   . Worried About Charity fundraiser in the Last Year:   . Arboriculturist in the Last Year:   Transportation Needs:   . Film/video editor (Medical):   Marland Kitchen Lack of Transportation (Non-Medical):   Physical Activity:   . Days of Exercise per Week:   . Minutes of Exercise per Session:   Stress:   . Feeling of Stress :   Social Connections:   . Frequency of Communication with Friends and Family:   . Frequency of Social Gatherings with Friends and Family:   . Attends Religious  Services:   . Active Member of Clubs or Organizations:   . Attends Archivist Meetings:   Marland Kitchen Marital Status:  Family History: The patient's family history includes Diabetes in his father; Heart failure in his father; Hypertension in his father.  ROS:   Please see the history of present illness.     All other systems reviewed and are negative.  EKGs/Labs/Other Studies Reviewed:    The following studies were reviewed today:  Echo 02/28/2019 1. Left ventricular ejection fraction, by visual estimation, is 55 to  60%. The left ventricle has normal function. There is moderately increased  left ventricular hypertrophy.  2. Left ventricular diastolic function could not be evaluated.  3. Global right ventricle has mildly reduced systolic function.The right  ventricular size is normal. No increase in right ventricular wall  thickness.  4. Left atrial size was mildly dilated.  5. Right atrial size was moderately dilated.  6. The mitral valve is normal in structure. Mild mitral valve  regurgitation. No evidence of mitral stenosis.  7. The tricuspid valve is normal in structure. Tricuspid valve  regurgitation is mild.  8. The aortic valve is tricuspid. Aortic valve regurgitation is not  visualized. No evidence of aortic valve sclerosis or stenosis.  9. The pulmonic valve was normal in structure. Pulmonic valve  regurgitation is not visualized.  10. Moderately elevated pulmonary artery systolic pressure.  11. The atrial septum is grossly normal.   EKG:  EKG is ordered today.  The ekg ordered today demonstrates NSR without significant ST-T wave changes  Recent Labs: 08/09/2019: Magnesium 2.2 09/04/2019: TSH 6.700 09/07/2019: ALT 31; BUN 42; Creatinine, Ser 2.48; Hemoglobin 10.1; Platelets 506; Potassium 4.8; Sodium 137  Recent Lipid Panel    Component Value Date/Time   CHOL 157 04/04/2018 1103   TRIG 256 (H) 04/04/2018 1103   HDL 42 04/04/2018 1103   CHOLHDL 3.7  04/04/2018 1103   CHOLHDL 3.8 04/10/2010 0315   VLDL 32 04/10/2010 0315   LDLCALC 64 04/04/2018 1103    Physical Exam:    VS:  BP 134/82   Pulse 61   Temp (!) 97 F (36.1 C)   Ht 6\' 2"  (1.88 m)   Wt (!) 372 lb (168.7 kg)   SpO2 98%   BMI 47.76 kg/m     Wt Readings from Last 3 Encounters:  09/05/19 (!) 372 lb (168.7 kg)  08/25/19 (!) 380 lb (172.4 kg)  08/16/19 (!) 378 lb 1.6 oz (171.5 kg)     GEN:  Well nourished, well developed in no acute distress HEENT: Normal NECK: No JVD; No carotid bruits LYMPHATICS: No lymphadenopathy CARDIAC: RRR, no murmurs, rubs, gallops RESPIRATORY:  Clear to auscultation without rales, wheezing or rhonchi  ABDOMEN: Soft, non-tender, non-distended MUSCULOSKELETAL:  2+ edema; No deformity  SKIN: Warm and dry NEUROLOGIC:  Alert and oriented x 3 PSYCHIATRIC:  Normal affect   ASSESSMENT:    1. Bilateral leg edema   2. Hyperkalemia   3. PAF (paroxysmal atrial fibrillation) ([place])   4. Essential hypertension   5. Controlled type 2 diabetes mellitus without complication, without long-term current use of insulin (HCC)   6. Stage 3 chronic kidney disease, unspecified whether stage 3a or 3b CKD    PLAN:    In order of problems listed above:  1. Bilateral leg edema: He continued to have severe bilateral lower extremity edema.  I recommended to increase Lasix for 3 days before going back to the previous dose of 40 mg daily.  However because of my concern with higher dose of diuretic interacting with Lokelma which may drop the potassium too low, I recommended  he start on the higher dose of diuretic this Thursday on 6/10 for 3 days before going back to the 40 mg daily thereafter.  He will come in for basic metabolic panel this Thursday and also a repeat BMET in 2 weeks.  2. Critical hyperkalemia: Potassium came back this morning at 3 AM at 6.1.  Patient had recent admission with AKI and hyperkalemia as well and was seen by nephrology service.  He is not  on any medication that can raise potassium at this time.  I discussed the case with our clinical pharmacist who recommended [facility] packet for 2 days then recheck blood work this Thursday.  3. PAF: Maintaining normal sinus rhythm.  Continue amiodarone and Eliquis  4. Hypertension: Blood pressure stable  5. DM2: Managed by primary care provider  6. CKD stage III: He had AKI during recent admission.  Will need basic metabolic panel this Thursday and also in 2 weeks.   Medication Adjustments/Labs and Tests Ordered: Current medicines are reviewed at length with the patient today.  Concerns regarding medicines are outlined above.  Orders Placed This Encounter  Procedures  . Basic metabolic panel  . Basic metabolic panel   No orders of the defined types were placed in this encounter.   Patient Instructions  Medication Instructions:   Starting Thursday 09/07/19 INCREASE Lasix to 80 mg daily for 3 days then resume on day 4 Lasix 40 mg daily  *If you need a refill on your cardiac medications before your next appointment, please call your pharmacy*  Lab Work: Your physician recommends that you return for lab work Thursday 06/10/21and again in 2 weeks on follow up appointment:   BMET  If you have labs (blood work) drawn today and your tests are completely normal, you will receive your results only by: Marland Kitchen MyChart Message (if you have MyChart) OR . A paper copy in the mail If you have any lab test that is abnormal or we need to change your treatment, we will call you to review the results.  Testing/Procedures: NONE ordered at this time of appointment   Follow-Up: At [facility], you and your health needs are our priority.  As part of our continuing mission to provide you with exceptional heart care, we have created designated Provider Care Teams.  These Care Teams include your primary Cardiologist (physician) and Advanced Practice Providers (APPs -  Physician Assistants and Nurse  Practitioners) who all work together to provide you with the care you need, when you need it.  We recommend signing up for the patient portal called "MyChart".  Sign up information is provided on this After Visit Summary.  MyChart is used to connect with patients for Virtual Visits (Telemedicine).  Patients are able to view lab/test results, encounter notes, upcoming appointments, etc.  Non-urgent messages can be sent to your provider as well.   To learn more about what you can do with MyChart, go to [email or web address].    Your next appointment:   2 week(s)  The format for your next appointment:   In Person  Provider:   Almyra Deforest, PA-C  Other Instructions      Signed, Almyra Deforest, [place]  09/07/2019 3:58 PM    [place]

## 2019-09-05 NOTE — Progress Notes (Addendum)
Subjective: Jeffery [name] is a 59 y.o. is seen today in office s/p right foot wound excision, debridement Acell application preformed on 08/14/2019.  Presents today for dressing change.  States he still getting quite a bit of swelling to both of his legs now he has blisters on both and having clear drainage.  He currently denies any fevers but gets occasional chills.  No nausea or vomiting.  Is also concerned because his urine had blood in it yesterday.  He is following up with cardiology later this afternoon as well.  Not have an appointment with primary care physician.  Objective: General: No acute distress, AAOx3  DP/PT pulses palpable 2/4, CRT < 3 sec to all digits.  Right foot vomiting dorsal lateral aspect the foot with graft intact.  Chronic edema bilateral lower extremities no significant blister formations bilateral legs which I drained surrounding clear drainage expressed.  There is no purulence.  Mild erythema of the dorsal half of the left foot.  There is no surrounding erythema to the calf on the right foot.  There is no fluctuation crepitation of the graft on the right foot. No pain with calf compression, warmth, erythema.   Assessment and Plan:  Status post left foot wound excision, debridement with bilateral lower extremity edema/blisters  -Treatment options discussed including all alternatives, risks, and complications -I drain the blister still only clear drainage expressed.  Is erythema to the dorsal left foot.  Given the swelling and erythema prescribed clindamycin due to kidney function.  Follow cardiology today.  Strongly encouraged him to follow-up with his primary care physician.  Encouraged elevation.  Also referral to the wound care center will be placed today.  I will see him back on Thursday for dressing changes.  Try to get home health care to come to his house but so far not able to do so. -Due to blood in the urine will refer to urology  -Monitor for any clinical  signs or symptoms of infection and directed to call the office immediately should any occur or go to the ER.  No follow-ups on file.  Trula Slade DPM

## 2019-09-07 ENCOUNTER — Ambulatory Visit: Payer: Medicaid Other | Admitting: Podiatry

## 2019-09-07 ENCOUNTER — Telehealth: Payer: Self-pay | Admitting: Podiatry

## 2019-09-07 ENCOUNTER — Telehealth: Payer: Self-pay | Admitting: Cardiology

## 2019-09-07 ENCOUNTER — Telehealth: Payer: Self-pay | Admitting: Cardiovascular Disease

## 2019-09-07 ENCOUNTER — Inpatient Hospital Stay (HOSPITAL_COMMUNITY)
Admission: RE | Admit: 2019-09-07 | Discharge: 2019-09-28 | DRG: 871 | Disposition: A | Discharge status: Home Health | Payer: Medicaid Other | Attending: Internal Medicine | Admitting: Internal Medicine

## 2019-09-07 ENCOUNTER — Encounter (HOSPITAL_COMMUNITY): Payer: Self-pay

## 2019-09-07 ENCOUNTER — Other Ambulatory Visit: Payer: Self-pay

## 2019-09-07 ENCOUNTER — Encounter: Payer: Self-pay | Admitting: Physician Assistant

## 2019-09-07 ENCOUNTER — Emergency Department (HOSPITAL_COMMUNITY): Payer: Medicaid Other

## 2019-09-07 DIAGNOSIS — I130 Hypertensive heart and chronic kidney disease with heart failure and stage 1 through stage 4 chronic kidney disease, or unspecified chronic kidney disease: Secondary | ICD-10-CM | POA: Diagnosis present

## 2019-09-07 DIAGNOSIS — L97929 Non-pressure chronic ulcer of unspecified part of left lower leg with unspecified severity: Secondary | ICD-10-CM | POA: Diagnosis present

## 2019-09-07 DIAGNOSIS — E11628 Type 2 diabetes mellitus with other skin complications: Secondary | ICD-10-CM

## 2019-09-07 DIAGNOSIS — IMO0002 Reserved for concepts with insufficient information to code with codable children: Secondary | ICD-10-CM | POA: Diagnosis present

## 2019-09-07 DIAGNOSIS — Z89422 Acquired absence of other left toe(s): Secondary | ICD-10-CM | POA: Diagnosis not present

## 2019-09-07 DIAGNOSIS — E11621 Type 2 diabetes mellitus with foot ulcer: Secondary | ICD-10-CM | POA: Diagnosis not present

## 2019-09-07 DIAGNOSIS — E860 Dehydration: Secondary | ICD-10-CM | POA: Diagnosis present

## 2019-09-07 DIAGNOSIS — M79671 Pain in right foot: Secondary | ICD-10-CM | POA: Diagnosis not present

## 2019-09-07 DIAGNOSIS — Z794 Long term (current) use of insulin: Secondary | ICD-10-CM

## 2019-09-07 DIAGNOSIS — I4891 Unspecified atrial fibrillation: Secondary | ICD-10-CM | POA: Diagnosis not present

## 2019-09-07 DIAGNOSIS — Z87891 Personal history of nicotine dependence: Secondary | ICD-10-CM

## 2019-09-07 DIAGNOSIS — Z888 Allergy status to other drugs, medicaments and biological substances status: Secondary | ICD-10-CM

## 2019-09-07 DIAGNOSIS — N170 Acute kidney failure with tubular necrosis: Secondary | ICD-10-CM | POA: Diagnosis not present

## 2019-09-07 DIAGNOSIS — R001 Bradycardia, unspecified: Secondary | ICD-10-CM | POA: Diagnosis present

## 2019-09-07 DIAGNOSIS — E8770 Fluid overload, unspecified: Secondary | ICD-10-CM | POA: Diagnosis present

## 2019-09-07 DIAGNOSIS — Z8249 Family history of ischemic heart disease and other diseases of the circulatory system: Secondary | ICD-10-CM

## 2019-09-07 DIAGNOSIS — L03116 Cellulitis of left lower limb: Secondary | ICD-10-CM | POA: Diagnosis present

## 2019-09-07 DIAGNOSIS — E11610 Type 2 diabetes mellitus with diabetic neuropathic arthropathy: Secondary | ICD-10-CM | POA: Diagnosis present

## 2019-09-07 DIAGNOSIS — A419 Sepsis, unspecified organism: Principal | ICD-10-CM

## 2019-09-07 DIAGNOSIS — I5032 Chronic diastolic (congestive) heart failure: Secondary | ICD-10-CM | POA: Diagnosis not present

## 2019-09-07 DIAGNOSIS — L97529 Non-pressure chronic ulcer of other part of left foot with unspecified severity: Secondary | ICD-10-CM | POA: Diagnosis present

## 2019-09-07 DIAGNOSIS — I517 Cardiomegaly: Secondary | ICD-10-CM | POA: Diagnosis not present

## 2019-09-07 DIAGNOSIS — Z20822 Contact with and (suspected) exposure to covid-19: Secondary | ICD-10-CM | POA: Diagnosis not present

## 2019-09-07 DIAGNOSIS — I10 Essential (primary) hypertension: Secondary | ICD-10-CM | POA: Diagnosis not present

## 2019-09-07 DIAGNOSIS — M25562 Pain in left knee: Secondary | ICD-10-CM

## 2019-09-07 DIAGNOSIS — E1165 Type 2 diabetes mellitus with hyperglycemia: Secondary | ICD-10-CM | POA: Diagnosis not present

## 2019-09-07 DIAGNOSIS — E875 Hyperkalemia: Secondary | ICD-10-CM | POA: Diagnosis not present

## 2019-09-07 DIAGNOSIS — N183 Chronic kidney disease, stage 3 unspecified: Secondary | ICD-10-CM | POA: Diagnosis not present

## 2019-09-07 DIAGNOSIS — N1831 Chronic kidney disease, stage 3a: Secondary | ICD-10-CM | POA: Diagnosis present

## 2019-09-07 DIAGNOSIS — L02619 Cutaneous abscess of unspecified foot: Secondary | ICD-10-CM | POA: Diagnosis not present

## 2019-09-07 DIAGNOSIS — E1142 Type 2 diabetes mellitus with diabetic polyneuropathy: Secondary | ICD-10-CM | POA: Diagnosis present

## 2019-09-07 DIAGNOSIS — E1169 Type 2 diabetes mellitus with other specified complication: Secondary | ICD-10-CM | POA: Diagnosis present

## 2019-09-07 DIAGNOSIS — E1129 Type 2 diabetes mellitus with other diabetic kidney complication: Secondary | ICD-10-CM | POA: Diagnosis not present

## 2019-09-07 DIAGNOSIS — Z6841 Body Mass Index (BMI) 40.0 and over, adult: Secondary | ICD-10-CM

## 2019-09-07 DIAGNOSIS — Z7901 Long term (current) use of anticoagulants: Secondary | ICD-10-CM

## 2019-09-07 DIAGNOSIS — J9811 Atelectasis: Secondary | ICD-10-CM | POA: Diagnosis not present

## 2019-09-07 DIAGNOSIS — E1122 Type 2 diabetes mellitus with diabetic chronic kidney disease: Secondary | ICD-10-CM | POA: Diagnosis present

## 2019-09-07 DIAGNOSIS — M79672 Pain in left foot: Secondary | ICD-10-CM | POA: Diagnosis not present

## 2019-09-07 DIAGNOSIS — M25462 Effusion, left knee: Secondary | ICD-10-CM | POA: Diagnosis not present

## 2019-09-07 DIAGNOSIS — M25512 Pain in left shoulder: Secondary | ICD-10-CM | POA: Diagnosis present

## 2019-09-07 DIAGNOSIS — R946 Abnormal results of thyroid function studies: Secondary | ICD-10-CM | POA: Diagnosis present

## 2019-09-07 DIAGNOSIS — N028 Recurrent and persistent hematuria with other morphologic changes: Secondary | ICD-10-CM | POA: Diagnosis present

## 2019-09-07 DIAGNOSIS — Z833 Family history of diabetes mellitus: Secondary | ICD-10-CM | POA: Diagnosis not present

## 2019-09-07 DIAGNOSIS — M109 Gout, unspecified: Secondary | ICD-10-CM | POA: Diagnosis present

## 2019-09-07 DIAGNOSIS — Z79899 Other long term (current) drug therapy: Secondary | ICD-10-CM

## 2019-09-07 DIAGNOSIS — R0902 Hypoxemia: Secondary | ICD-10-CM | POA: Diagnosis not present

## 2019-09-07 DIAGNOSIS — G4733 Obstructive sleep apnea (adult) (pediatric): Secondary | ICD-10-CM | POA: Diagnosis present

## 2019-09-07 DIAGNOSIS — L02611 Cutaneous abscess of right foot: Secondary | ICD-10-CM | POA: Diagnosis not present

## 2019-09-07 DIAGNOSIS — L089 Local infection of the skin and subcutaneous tissue, unspecified: Secondary | ICD-10-CM

## 2019-09-07 DIAGNOSIS — D631 Anemia in chronic kidney disease: Secondary | ICD-10-CM | POA: Diagnosis present

## 2019-09-07 DIAGNOSIS — I129 Hypertensive chronic kidney disease with stage 1 through stage 4 chronic kidney disease, or unspecified chronic kidney disease: Secondary | ICD-10-CM | POA: Diagnosis not present

## 2019-09-07 DIAGNOSIS — Z96651 Presence of right artificial knee joint: Secondary | ICD-10-CM | POA: Diagnosis present

## 2019-09-07 DIAGNOSIS — E1121 Type 2 diabetes mellitus with diabetic nephropathy: Secondary | ICD-10-CM | POA: Diagnosis present

## 2019-09-07 DIAGNOSIS — R609 Edema, unspecified: Secondary | ICD-10-CM | POA: Diagnosis not present

## 2019-09-07 DIAGNOSIS — E782 Mixed hyperlipidemia: Secondary | ICD-10-CM | POA: Diagnosis present

## 2019-09-07 DIAGNOSIS — R112 Nausea with vomiting, unspecified: Secondary | ICD-10-CM | POA: Diagnosis not present

## 2019-09-07 DIAGNOSIS — I447 Left bundle-branch block, unspecified: Secondary | ICD-10-CM | POA: Diagnosis present

## 2019-09-07 DIAGNOSIS — M7989 Other specified soft tissue disorders: Secondary | ICD-10-CM | POA: Diagnosis not present

## 2019-09-07 DIAGNOSIS — K760 Fatty (change of) liver, not elsewhere classified: Secondary | ICD-10-CM | POA: Diagnosis not present

## 2019-09-07 DIAGNOSIS — K5903 Drug induced constipation: Secondary | ICD-10-CM | POA: Diagnosis not present

## 2019-09-07 DIAGNOSIS — E11649 Type 2 diabetes mellitus with hypoglycemia without coma: Secondary | ICD-10-CM | POA: Diagnosis not present

## 2019-09-07 DIAGNOSIS — N179 Acute kidney failure, unspecified: Secondary | ICD-10-CM | POA: Diagnosis present

## 2019-09-07 DIAGNOSIS — G894 Chronic pain syndrome: Secondary | ICD-10-CM | POA: Diagnosis present

## 2019-09-07 DIAGNOSIS — R945 Abnormal results of liver function studies: Secondary | ICD-10-CM | POA: Diagnosis not present

## 2019-09-07 DIAGNOSIS — I482 Chronic atrial fibrillation, unspecified: Secondary | ICD-10-CM | POA: Diagnosis not present

## 2019-09-07 DIAGNOSIS — I480 Paroxysmal atrial fibrillation: Secondary | ICD-10-CM | POA: Diagnosis not present

## 2019-09-07 DIAGNOSIS — Z79891 Long term (current) use of opiate analgesic: Secondary | ICD-10-CM | POA: Diagnosis not present

## 2019-09-07 DIAGNOSIS — M1712 Unilateral primary osteoarthritis, left knee: Secondary | ICD-10-CM | POA: Diagnosis present

## 2019-09-07 DIAGNOSIS — I878 Other specified disorders of veins: Secondary | ICD-10-CM

## 2019-09-07 DIAGNOSIS — N172 Acute kidney failure with medullary necrosis: Secondary | ICD-10-CM | POA: Diagnosis not present

## 2019-09-07 DIAGNOSIS — L97519 Non-pressure chronic ulcer of other part of right foot with unspecified severity: Secondary | ICD-10-CM | POA: Diagnosis present

## 2019-09-07 DIAGNOSIS — R7989 Other specified abnormal findings of blood chemistry: Secondary | ICD-10-CM

## 2019-09-07 DIAGNOSIS — K8020 Calculus of gallbladder without cholecystitis without obstruction: Secondary | ICD-10-CM | POA: Diagnosis not present

## 2019-09-07 LAB — HEMOGLOBIN A1C
Hgb A1c MFr Bld: 8 % — ABNORMAL HIGH (ref 4.8–5.6)
Mean Plasma Glucose: 182.9 mg/dL

## 2019-09-07 LAB — COMPREHENSIVE METABOLIC PANEL
ALT: 31 U/L (ref 0–44)
AST: 24 U/L (ref 15–41)
Albumin: 2.3 g/dL — ABNORMAL LOW (ref 3.5–5.0)
Alkaline Phosphatase: 84 U/L (ref 38–126)
Anion gap: 11 (ref 5–15)
BUN: 42 mg/dL — ABNORMAL HIGH (ref 6–20)
CO2: 24 mmol/L (ref 22–32)
Calcium: 8.3 mg/dL — ABNORMAL LOW (ref 8.9–10.3)
Chloride: 102 mmol/L (ref 98–111)
Creatinine, Ser: 2.48 mg/dL — ABNORMAL HIGH (ref 0.61–1.24)
GFR calc Af Amer: 32 mL/min — ABNORMAL LOW (ref >60)
GFR calc non Af Amer: 28 mL/min — ABNORMAL LOW (ref >60)
Glucose, Bld: 195 mg/dL — ABNORMAL HIGH (ref 70–99)
Potassium: 4.8 mmol/L (ref 3.5–5.1)
Sodium: 137 mmol/L (ref 135–145)
Total Bilirubin: 0.9 mg/dL (ref 0.3–1.2)
Total Protein: 7.6 g/dL (ref 6.5–8.1)

## 2019-09-07 LAB — SARS CORONAVIRUS 2 BY RT PCR (HOSPITAL ORDER, PERFORMED IN ~~LOC~~ HOSPITAL LAB): SARS Coronavirus 2: NEGATIVE

## 2019-09-07 LAB — CBC WITH DIFFERENTIAL/PLATELET
Abs Immature Granulocytes: 0.17 10*3/uL — ABNORMAL HIGH (ref 0.00–0.07)
Basophils Absolute: 0.1 10*3/uL (ref 0.0–0.1)
Basophils Relative: 0 %
Eosinophils Absolute: 0.1 10*3/uL (ref 0.0–0.5)
Eosinophils Relative: 1 %
HCT: 33.9 % — ABNORMAL LOW (ref 39.0–52.0)
Hemoglobin: 10.1 g/dL — ABNORMAL LOW (ref 13.0–17.0)
Immature Granulocytes: 1 %
Lymphocytes Relative: 7 %
Lymphs Abs: 1.3 10*3/uL (ref 0.7–4.0)
MCH: 27.3 pg (ref 26.0–34.0)
MCHC: 29.8 g/dL — ABNORMAL LOW (ref 30.0–36.0)
MCV: 91.6 fL (ref 80.0–100.0)
Monocytes Absolute: 2.3 10*3/uL — ABNORMAL HIGH (ref 0.1–1.0)
Monocytes Relative: 12 %
Neutro Abs: 15.5 10*3/uL — ABNORMAL HIGH (ref 1.7–7.7)
Neutrophils Relative %: 79 %
Platelets: 506 10*3/uL — ABNORMAL HIGH (ref 150–400)
RBC: 3.7 MIL/uL — ABNORMAL LOW (ref 4.22–5.81)
RDW: 15.3 % (ref 11.5–15.5)
WBC: 19.4 10*3/uL — ABNORMAL HIGH (ref 4.0–10.5)
nRBC: 0 % (ref 0.0–0.2)

## 2019-09-07 LAB — CBG MONITORING, ED: Glucose-Capillary: 151 mg/dL — ABNORMAL HIGH (ref 70–99)

## 2019-09-07 LAB — CK: Total CK: 322 U/L (ref 49–397)

## 2019-09-07 LAB — LACTIC ACID, PLASMA: Lactic Acid, Venous: 0.8 mmol/L (ref 0.5–1.9)

## 2019-09-07 LAB — GLUCOSE, CAPILLARY: Glucose-Capillary: 182 mg/dL — ABNORMAL HIGH (ref 70–99)

## 2019-09-07 MED ORDER — ATORVASTATIN CALCIUM 10 MG PO TABS
20.0000 mg | ORAL_TABLET | Freq: Every day | ORAL | Status: DC
  Administered 2019-09-08 – 2019-09-28 (×21): 20 mg via ORAL
  Filled 2019-09-07 (×21): qty 2

## 2019-09-07 MED ORDER — VANCOMYCIN HCL 1750 MG/350ML IV SOLN
1750.0000 mg | INTRAVENOUS | Status: DC
  Administered 2019-09-08: 1750 mg via INTRAVENOUS
  Filled 2019-09-07: qty 350

## 2019-09-07 MED ORDER — POLYETHYLENE GLYCOL 3350 17 G PO PACK
17.0000 g | PACK | Freq: Every day | ORAL | Status: DC | PRN
  Administered 2019-09-13: 17 g via ORAL
  Filled 2019-09-07 (×2): qty 1

## 2019-09-07 MED ORDER — OXYCODONE HCL 5 MG PO TABS
5.0000 mg | ORAL_TABLET | ORAL | Status: DC | PRN
  Administered 2019-09-07 – 2019-09-28 (×106): 5 mg via ORAL
  Filled 2019-09-07 (×106): qty 1

## 2019-09-07 MED ORDER — VANCOMYCIN HCL 2000 MG/400ML IV SOLN
2000.0000 mg | Freq: Once | INTRAVENOUS | Status: AC
  Administered 2019-09-07: 2000 mg via INTRAVENOUS
  Filled 2019-09-07: qty 400

## 2019-09-07 MED ORDER — ACETAMINOPHEN 650 MG RE SUPP: 650.0000 mg | Freq: Four times a day (QID) | RECTAL | Status: DC | PRN

## 2019-09-07 MED ORDER — PIPERACILLIN-TAZOBACTAM 3.375 G IVPB 30 MIN
3.3750 g | Freq: Once | INTRAVENOUS | Status: AC
  Administered 2019-09-07: 3.375 g via INTRAVENOUS
  Filled 2019-09-07: qty 50

## 2019-09-07 MED ORDER — HYDROMORPHONE HCL 1 MG/ML IJ SOLN
1.0000 mg | Freq: Once | INTRAMUSCULAR | Status: AC
  Administered 2019-09-07: 1 mg via INTRAVENOUS
  Filled 2019-09-07: qty 1

## 2019-09-07 MED ORDER — INSULIN ASPART 100 UNIT/ML ~~LOC~~ SOLN
0.0000 [IU] | Freq: Three times a day (TID) | SUBCUTANEOUS | Status: DC
  Administered 2019-09-08 – 2019-09-12 (×4): 2 [IU] via SUBCUTANEOUS
  Administered 2019-09-12: 3 [IU] via SUBCUTANEOUS
  Administered 2019-09-13 – 2019-09-14 (×4): 2 [IU] via SUBCUTANEOUS
  Administered 2019-09-15: 1 [IU] via SUBCUTANEOUS
  Administered 2019-09-15: 3 [IU] via SUBCUTANEOUS
  Administered 2019-09-16 – 2019-09-22 (×11): 2 [IU] via SUBCUTANEOUS
  Administered 2019-09-23 – 2019-09-24 (×3): 3 [IU] via SUBCUTANEOUS
  Administered 2019-09-25 (×2): 2 [IU] via SUBCUTANEOUS
  Filled 2019-09-07: qty 0.15

## 2019-09-07 MED ORDER — SENNOSIDES-DOCUSATE SODIUM 8.6-50 MG PO TABS
1.0000 | ORAL_TABLET | Freq: Every evening | ORAL | Status: DC | PRN
  Administered 2019-09-12 – 2019-09-13 (×2): 1 via ORAL
  Filled 2019-09-07 (×2): qty 1

## 2019-09-07 MED ORDER — INSULIN ASPART 100 UNIT/ML ~~LOC~~ SOLN
0.0000 [IU] | Freq: Every day | SUBCUTANEOUS | Status: DC
  Administered 2019-09-17: 3 [IU] via SUBCUTANEOUS
  Filled 2019-09-07: qty 0.05

## 2019-09-07 MED ORDER — OXYCODONE-ACETAMINOPHEN 10-325 MG PO TABS: 1.0000 | ORAL_TABLET | ORAL | Status: DC | PRN

## 2019-09-07 MED ORDER — VANCOMYCIN HCL IN DEXTROSE 1-5 GM/200ML-% IV SOLN
1000.0000 mg | Freq: Once | INTRAVENOUS | Status: DC
Start: 2019-09-07 — End: 2019-09-07

## 2019-09-07 MED ORDER — AMIODARONE HCL 200 MG PO TABS
200.0000 mg | ORAL_TABLET | Freq: Two times a day (BID) | ORAL | Status: DC
  Administered 2019-09-07 – 2019-09-08 (×2): 200 mg via ORAL
  Filled 2019-09-07 (×3): qty 1

## 2019-09-07 MED ORDER — BISACODYL 5 MG PO TBEC
5.0000 mg | DELAYED_RELEASE_TABLET | Freq: Every day | ORAL | Status: DC | PRN
  Administered 2019-09-09 – 2019-09-21 (×6): 5 mg via ORAL
  Filled 2019-09-07 (×6): qty 1

## 2019-09-07 MED ORDER — ONDANSETRON HCL 4 MG PO TABS
4.0000 mg | ORAL_TABLET | Freq: Four times a day (QID) | ORAL | Status: DC | PRN
  Administered 2019-09-17 – 2019-09-26 (×6): 4 mg via ORAL
  Filled 2019-09-07 (×7): qty 1

## 2019-09-07 MED ORDER — GABAPENTIN 300 MG PO CAPS
300.0000 mg | ORAL_CAPSULE | Freq: Two times a day (BID) | ORAL | Status: DC
  Administered 2019-09-07 – 2019-09-28 (×42): 300 mg via ORAL
  Filled 2019-09-07 (×43): qty 1

## 2019-09-07 MED ORDER — APIXABAN 5 MG PO TABS
5.0000 mg | ORAL_TABLET | Freq: Two times a day (BID) | ORAL | Status: DC
  Administered 2019-09-07 – 2019-09-24 (×35): 5 mg via ORAL
  Filled 2019-09-07 (×36): qty 1

## 2019-09-07 MED ORDER — METOPROLOL TARTRATE 25 MG PO TABS
50.0000 mg | ORAL_TABLET | Freq: Two times a day (BID) | ORAL | Status: DC
  Administered 2019-09-07 – 2019-09-08 (×2): 50 mg via ORAL
  Filled 2019-09-07 (×2): qty 2

## 2019-09-07 MED ORDER — MORPHINE SULFATE (PF) 2 MG/ML IV SOLN
2.0000 mg | INTRAVENOUS | Status: DC | PRN
  Administered 2019-09-07 – 2019-09-17 (×8): 2 mg via INTRAVENOUS
  Filled 2019-09-07 (×8): qty 1

## 2019-09-07 MED ORDER — INSULIN GLARGINE 100 UNIT/ML ~~LOC~~ SOLN
50.0000 [IU] | Freq: Two times a day (BID) | SUBCUTANEOUS | Status: DC
  Administered 2019-09-07 – 2019-09-11 (×9): 50 [IU] via SUBCUTANEOUS
  Filled 2019-09-07 (×10): qty 0.5

## 2019-09-07 MED ORDER — ACETAMINOPHEN 325 MG PO TABS
650.0000 mg | ORAL_TABLET | Freq: Four times a day (QID) | ORAL | Status: DC | PRN
  Administered 2019-09-13: 650 mg via ORAL
  Filled 2019-09-07: qty 2

## 2019-09-07 MED ORDER — SODIUM CHLORIDE 0.9 % IV SOLN: INTRAVENOUS | Status: AC

## 2019-09-07 MED ORDER — ONDANSETRON HCL 4 MG/2ML IJ SOLN
4.0000 mg | Freq: Four times a day (QID) | INTRAMUSCULAR | Status: DC | PRN
  Administered 2019-09-15 – 2019-09-23 (×6): 4 mg via INTRAVENOUS
  Filled 2019-09-07 (×6): qty 2

## 2019-09-07 MED ORDER — OXYCODONE-ACETAMINOPHEN 5-325 MG PO TABS
1.0000 | ORAL_TABLET | ORAL | Status: DC | PRN
  Administered 2019-09-07 – 2019-09-28 (×104): 1 via ORAL
  Filled 2019-09-07 (×104): qty 1

## 2019-09-07 NOTE — ED Notes (Signed)
Attempted IV access. Unsuccessful times 2

## 2019-09-07 NOTE — Progress Notes (Signed)
Pharmacy Antibiotic Note  Jeffery [name] is a 59 y.o. male admitted on 09/07/2019 with cellulitis of left lower extremity. Pharmacy has been consulted for Vancomycin dosing.  Plan:  Vancomycin 2g IV x 1 ordered in the ED. Continue with maintenance dose of 1750mg  IV q24h thereafter.   Vancomycin trough level at steady state, as indicated.  Monitor renal function closely in setting of AKI.   Monitor cultures, clinical course, and for ability to de-escalate therapy.      Temp (24hrs), Avg:98.5 F (36.9 C), Min:98.5 F (36.9 C), Max:98.5 F (36.9 C)  Recent Labs  Lab 09/04/19 1053 09/07/19 1221  WBC  --  19.4*  CREATININE 1.63* 2.48*  LATICACIDVEN  --  0.8    Estimated Creatinine Clearance: 54.3 mL/min (A) (by C-G formula based on SCr of 2.48 mg/dL (H)).    Allergies  Allergen Reactions  . Metformin And Related Other (See Comments)    GI upset    Antimicrobials this admission: 6/10 Zosyn x 1 6/10 Vancomycin >>  Dose adjustments this admission: --  Microbiology results: 6/10 BCx: ordered 6/10 COVID: sent  Thank you for allowing pharmacy to be a part of this patient's care.   Lindell Spar, PharmD, BCPS Clinical Pharmacist  09/07/2019 2:55 PM

## 2019-09-07 NOTE — Telephone Encounter (Signed)
Pt having severe pain in Lt shoulder and Lt leg, now taking 3 percocet at a time due to severe pain.  He cannot get out of bed.  Instructed to call 911 to go to ER and be seen.

## 2019-09-07 NOTE — Telephone Encounter (Signed)
Per pt notes significant pain to left shoulder,hip and knee and pt feels the kxelate is the culprit.Pt states cannot get out of bed and bear weight on left leg to come to office today for scheduled f/u lab work.Appears pt called earlier this am with same complaint and was instructed to call  911 and go to ED for eval.Pt states is unable to afford the $700.00 charge for EMS Reiterated that needs to go to ED for eval and tx Pt also kept complaining that has had to take extra pain med Percocet and has run out and cannot get anymore until tom.Will forward to [facility] PA and also to Ulla Gallo (to see about getting pt some assistance with transportation./cy

## 2019-09-07 NOTE — ED Triage Notes (Addendum)
Pt arrives [facility] from home with complaints of chronic edema and chronic weeping of the legs. Pt also reports that his PCP called and advised that the pt should come to ED to be evaluated and have K+ levels checked. Pt denies any new symptoms. Pt additionally reporting left hip pain and left shoulder pain. Pt [facility] for evaluation of labs but the pt states he could not get out of bed.

## 2019-09-07 NOTE — Telephone Encounter (Signed)
Looks like he presented to the ED

## 2019-09-07 NOTE — H&P (Signed)
History and Physical    Jeffery [name] OTL:[id number] DOB: [birth year] DOA: 09/07/2019  PCP: [name], MD Patient coming from: Home  Chief Complaint: left foot Pain  HPI: Jeffery [name] is a 59 y.o. male with medical history significant of Obesity, DM2, HTN, P Afib, EtOH use, Charcot's foot, Chronic Pain syndrome, Hx of Cardioversion, diastolic CHF, chronic LE blister and edema s/p excision by podiatry comes with worsening of left lower ext foot pain. He was admitted for MSSA bacteremia back in 11/7414 complicated by AKI and HyperK. Failed outpatient treatment with doxycycline which was prescribed by Podiatry on 09/05/19 witth instructions to come to the ED if worsens.  Later that day he was also seen by cardiologist and was noted to have hyperkalemia therefore given Kayexalate.  After going home for the 2 days he has been complaining of left-sided lower extremity and left shoulder pain with worsening of erythema in the left lower extremity.  He was unable to move the pain requiring EMS to bring him to the hospital  In the ER he was noted to have significant left lower extremity erythema and cellulitis with weeping/pruritus of left lower extremity.  Elevated WBC.  Creatinine 2.48, up from baseline of 1.6.  Started on vancomycin.  Medical team requested to admit the patient  Social history-denies any tobacco, alcohol use.  Admits of occasional marijuana use CODE STATUS-full   Review of Systems: As per HPI otherwise 10 point review of systems negative.  Review of Systems Otherwise negative except as per HPI, including: General: Denies fever, chills, night sweats or unintended weight loss. Resp: Denies cough, wheezing, shortness of breath. Cardiac: Denies chest pain, palpitations, orthopnea, paroxysmal nocturnal dyspnea. GI: Denies abdominal pain, nausea, vomiting, diarrhea or constipation GU: Denies dysuria, frequency, hesitancy or incontinence MS: Left lower extremity and left  upper extremity pain Neuro: Denies headache, neurologic deficits (focal weakness, numbness, tingling), abnormal gait Psych: Denies anxiety, depression, SI/HI/AVH Skin: Denies new rashes or lesions ID: Denies sick contacts, exotic exposures, travel  Past Medical History:  Diagnosis Date  . Atrial fibrillation ([place])   . Cellulitis and abscess of foot 07/2019   RIGHT FOOT  . Charcot's joint of right foot   . DDD (degenerative disc disease), lumbar   . Diabetes mellitus without complication ([place])   . Fatty liver   . Hypertension   . Obesity   . Rotator cuff disorder   . Shoulder impingement, right   . Spinal stenosis at L4-L5 level     Past Surgical History:  Procedure Laterality Date  . AMPUTATION Left 10/02/2014   Procedure: Left Third toe amputation ;  Surgeon: [name], MD;  Location: [facility];  Service: Orthopedics;  Laterality: Left;  Regular bed, wants to follow hip  . ANTERIOR CRUCIATE LIGAMENT REPAIR Right 90   reconstruction  . APPLICATION OF WOUND VAC Left 10/02/2014   Procedure: APPLICATION OF WOUND VAC; toe Surgeon: [name], MD;  Location: [facility];  Service: Orthopedics;  Laterality: Left;  . CARDIOVERSION N/A 04/26/2019   Procedure: CARDIOVERSION;  Surgeon: Pixie Casino, MD;  Location: [facility] ENDOSCOPY;  Service: Cardiovascular;  Laterality: N/A;  . CARDIOVERSION N/A 05/18/2019   Procedure: CARDIOVERSION (CATH LAB);  Surgeon: Constance Haw, MD;  Location: [facility] CV LAB;  Service: Cardiovascular;  Laterality: N/A;  . I & D EXTREMITY Left 10/05/2014   Procedure: IRRIGATION AND DEBRIDEMENT LEFT FOOT;  Surgeon: [name], MD;  Location: [facility];  Service: Orthopedics;  Laterality:  Left;  . INCISION AND DRAINAGE Right 08/09/2019   Procedure: INCISION AND DRAINAGE RIGHT FOOT;  Surgeon: Trula Slade, DPM;  Location: [facility];  Service: Podiatry;  Laterality: Right;  . KNEE ARTHROSCOPY W/ ACL RECONSTRUCTION Right   . TOTAL KNEE ARTHROPLASTY Right 03/28/2015  .  TOTAL KNEE ARTHROPLASTY Right 03/28/2015   Procedure: RIGHT TOTAL KNEE ARTHROPLASTY;  Surgeon: [name], MD;  Location: [facility];  Service: Orthopedics;  Laterality: Right;  . WOUND DEBRIDEMENT Right 08/14/2019   Procedure: DEBRIDEMENT WOUND;  Surgeon: Trula Slade, DPM;  Location: [employer] [facility];  Service: Podiatry;  Laterality: Right;    SOCIAL HISTORY:  reports that he has quit smoking. His smoking use included cigarettes. He smoked 0.00 packs per day for 38.00 years. He has never used smokeless tobacco. He reports current drug use. Drug: Marijuana. He reports that he does not drink alcohol.  Allergies  Allergen Reactions  . Metformin And Related Other (See Comments)    GI upset    FAMILY HISTORY: Family History  Problem Relation Age of Onset  . Diabetes Father   . Hypertension Father   . Heart failure Father      Prior to Admission medications   Medication Sig Start Date End Date Taking? Authorizing Provider  Accu-Chek FastClix Lancets MISC Use as directed to check blood sugar at least twice daily. E11.8 E11.65 Z79.4 Patient taking differently: 1 each by Other route See admin instructions. Use as directed to check blood sugar at least twice daily. E11.8 E11.65 Z79.4 10/18/18   [name], MD  amiodarone (PACERONE) 200 MG tablet Take 200 mg by mouth 2 (two) times daily.     [provider]  apixaban (ELIQUIS) 5 MG TABS tablet Take 1 tablet (5 mg total) by mouth 2 (two) times daily. 06/20/19   [name], MD  atorvastatin (LIPITOR) 20 MG tablet Take 1 tablet (20 mg total) by mouth daily. 03/28/19   [name], MD  Blood Glucose Monitoring Suppl (ACCU-CHEK AVIVA) device Use as instructed to check blood sugar 2 times daily. E11.8 E11.65 Z79.4 Patient taking differently: 1 each by Other route See admin instructions. Use as instructed to check blood sugar 2 times daily. E11.8 E11.65 Z79.4 10/18/18 10/18/19  [name], MD  doxycycline (VIBRA-TABS) 100 MG tablet  Take 1 tablet (100 mg total) by mouth 2 (two) times daily. 09/05/19   Trula Slade, DPM  furosemide (LASIX) 40 MG tablet Take 1 tablet (40 mg total) by mouth daily. 04/21/19 09/05/19  Troy Sine, MD  gabapentin (NEURONTIN) 300 MG capsule Take 1 capsule (300 mg total) by mouth 2 (two) times daily. Patient taking differently: Take 600 mg by mouth at bedtime as needed (pain).  03/28/19   [name], MD  glucose blood (ACCU-CHEK AVIVA) test strip Use as instructed to check blood sugar 2 times daily. E11.8 E11.65 Z79.4 Patient taking differently: 1 each by Other route See admin instructions. Use as instructed to check blood sugar 2 times daily. E11.8 E11.65 Z79.4 10/18/18   [name], MD  insulin glargine (LANTUS SOLOSTAR) 100 UNIT/ML Solostar Pen Inject 50 Units into the skin 2 (two) times daily. Patient taking differently: Inject 51 Units into the skin 2 (two) times daily.  06/20/19   [name], MD  insulin lispro (INSULIN LISPRO) 100 UNIT/ML KwikPen Junior Inject 0.08 mLs (8 Units total) into the skin 3 (three) times daily. Patient taking differently: Inject 8 Units into the skin 3 (three) times daily as needed (  Low blood sugar).  03/28/19   [name], MD  Insulin Pen Needle (B-D ULTRAFINE III SHORT PEN) 31G X 8 MM MISC 1 each by Does not apply route 3 (three) times daily. 10/12/17   [name], MD  Insulin Syringe-Needle U-100 (TRUEPLUS INSULIN SYRINGE) 30G X 5/16" 0.5 ML MISC Use as directed 3 times daily Patient taking differently: 1 each by Other route See admin instructions. Use as directed 3 times daily 10/06/17   [name], MD  liraglutide (VICTOZA) 18 MG/3ML SOPN Inject 0.3 mLs (1.8 mg total) into the skin daily. 06/20/19   [name], MD  metoprolol tartrate (LOPRESSOR) 100 MG tablet Take 0.5 tablets (50 mg total) by mouth 2 (two) times daily. 08/24/19 11/22/19  Camnitz, Ocie Doyne, MD  oxyCODONE-acetaminophen (PERCOCET) 10-325 MG tablet Take 1 tablet by  mouth 4 (four) times daily.     [provider]  polyethylene glycol (MIRALAX / GLYCOLAX) 17 g packet Take 17 g by mouth daily as needed for mild constipation. 08/16/19   Nita Sells, MD  Sennosides (EX-LAX) 15 MG TABS Take 15 mg by mouth daily as needed (Constipation).    [provider]  sodium chloride (OCEAN) 0.65 % SOLN nasal spray Place 1 spray into both nostrils daily as needed for congestion.     [provider]  TRUEPLUS INSULIN SYRINGE 30G X 5/16" 0.5 ML MISC USE AS DIRECTED 3 TIMES DAILY Patient taking differently: 1 each by Other route in the morning, at noon, and at bedtime.  04/03/16   Tresa Garter, MD    Physical Exam: Vitals:   09/07/19 1115 09/07/19 1125 09/07/19 1230  BP:  (!) 150/117 (!) 146/117  Pulse:  73 67  Resp:  16 13  Temp:  98.5 F (36.9 C)   TempSrc:  Oral   SpO2: 94% 100% 97%      Constitutional: NAD, calm, comfortable Eyes: PERRL, lids and conjunctivae normal ENMT: Mucous membranes are moist. Posterior pharynx clear of any exudate or lesions.Normal dentition.  Neck: normal, supple, no masses, no thyromegaly Respiratory: clear to auscultation bilaterally, no wheezing, no crackles. Normal respiratory effort. No accessory muscle use.  Cardiovascular: Regular rate and rhythm, no murmurs / rubs / gallops.  2+ bilateral pitting edema. 2+ pedal pulses. No carotid bruits.  Abdomen: no tenderness, no masses palpated. No hepatosplenomegaly. Bowel sounds positive.  Musculoskeletal: Limited range of motion of his left upper and left lower extremity secondary to pain and discomfort Skin: Bilateral lower extremity chronic skin changes-left lower extremity erythema extending up to his knees with pruritus and weeping. Neurologic: CN 2-12 grossly intact. Sensation intact, DTR normal. Strength 5/5 in all 4.  Psychiatric: Normal judgment and insight. Alert and oriented x 3. Normal mood.     Labs on Admission: I have personally  reviewed following labs and imaging studies  CBC: Recent Labs  Lab 09/07/19 1221  WBC 19.4*  NEUTROABS 15.5*  HGB 10.1*  HCT 33.9*  MCV 91.6  PLT 563*   Basic Metabolic Panel: Recent Labs  Lab 09/04/19 1053 09/07/19 1221  NA 138 137  K 6.1* 4.8  CL 103 102  CO2 20 24  GLUCOSE 158* 195*  BUN 32* 42*  CREATININE 1.63* 2.48*  CALCIUM 8.3* 8.3*   GFR: Estimated Creatinine Clearance: 54.3 mL/min (A) (by C-G formula based on SCr of 2.48 mg/dL (H)). Liver Function Tests: Recent Labs  Lab 09/07/19 1221  AST 24  ALT 31  ALKPHOS 84  BILITOT 0.9  PROT  7.6  ALBUMIN 2.3*   No results for input(s): LIPASE, AMYLASE in the last 168 hours. No results for input(s): AMMONIA in the last 168 hours. Coagulation Profile: No results for input(s): INR, PROTIME in the last 168 hours. Cardiac Enzymes: Recent Labs  Lab 09/07/19 1221  CKTOTAL 322   BNP (last 3 results) No results for input(s): PROBNP in the last 8760 hours. HbA1C: No results for input(s): HGBA1C in the last 72 hours. CBG: No results for input(s): GLUCAP in the last 168 hours. Lipid Profile: No results for input(s): CHOL, HDL, LDLCALC, TRIG, CHOLHDL, LDLDIRECT in the last 72 hours. Thyroid Function Tests: No results for input(s): TSH, T4TOTAL, FREET4, T3FREE, THYROIDAB in the last 72 hours. Anemia Panel: No results for input(s): VITAMINB12, FOLATE, FERRITIN, TIBC, IRON, RETICCTPCT in the last 72 hours. Urine analysis:    Component Value Date/Time   COLORURINE AMBER (A) 08/08/2019 0536   APPEARANCEUR CLOUDY (A) 08/08/2019 0536   LABSPEC 1.029 08/08/2019 0536   PHURINE 5.0 08/08/2019 0536   GLUCOSEU 50 (A) 08/08/2019 0536   HGBUR NEGATIVE 08/08/2019 0536   BILIRUBINUR NEGATIVE 08/08/2019 0536   BILIRUBINUR neg 12/31/2016 1401   KETONESUR NEGATIVE 08/08/2019 0536   PROTEINUR 100 (A) 08/08/2019 0536   UROBILINOGEN 4.0 (A) 12/31/2016 1401   UROBILINOGEN 0.2 10/01/2014 0951   NITRITE NEGATIVE 08/08/2019 0536     LEUKOCYTESUR NEGATIVE 08/08/2019 0536   Sepsis Labs: !!!!!!!!!!!!!!!!!!!!!!!!!!!!!!!!!!!!!!!!!!!! @LABRCNTIP (procalcitonin:4,lacticidven:4) )No results found for this or any previous visit (from the past 240 hour(s)).   Radiological Exams on Admission: DG Shoulder Left  Result Date: 09/07/2019 CLINICAL DATA:  Left shoulder pain EXAM: LEFT SHOULDER - 2+ VIEW COMPARISON:  03/01/2018 FINDINGS: There is no evidence of fracture or dislocation. Mild-to-moderate arthropathy of the [facility] joint. Glenohumeral joint is within normal limits. Soft tissues are unremarkable. Vascular congestion and interstitial opacities within the visualized left lung. IMPRESSION: 1. No acute osseous abnormality. Mild-to-moderate arthropathy of the left AC joint. 2. Vascular congestion and interstitial opacities within the visualized left lung. Suggest dedicated PA and lateral chest radiographs. Electronically Signed   By: Davina Poke D.O.   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 No acute fracture identified. Pes planus. Moderate arthropathy at the tarsometatarsal joints with prominent dorsal hypertrophy. Chronic deformity of the second digit proximal phalanx. No cortical destruction or periostitis is seen. There is diffuse soft tissue prominence. No soft tissue gas. IMPRESSION: 1. No acute osseous abnormality. No radiographic evidence of acute osteomyelitis. 2. Diffuse soft tissue prominence. 3. Moderate arthropathy at the tarsometatarsal joints. Electronically Signed   By: Davina Poke D.O.   On: 09/07/2019 13:38   DG Foot Complete Right  Result Date: 09/07/2019 CLINICAL DATA:  Chronic right foot pain EXAM: RIGHT FOOT COMPLETE - 3+ VIEW COMPARISON:  08/07/2019 FINDINGS: Chronic findings of Charcot arthropathy of the right midfoot with  fragmentation and remodeling of the osseous structures. No appreciable interval progression from prior radiograph. No new or acute fracture identified. There is a appears to be a soft tissue wound or defect the lateral aspect of the foot near the fifth metatarsal base. Progressive destructive changes at the fourth and fifth metatarsal bases could reflect changes related to progressive Charcot arthropathy versus osteomyelitis. IMPRESSION: 1. Findings of Charcot arthropathy of the right midfoot with progressive destructive changes at the  fourth and fifth metatarsal bases could reflect changes related to progressive Charcot arthropathy versus osteomyelitis. Correlation with serum inflammatory markers is suggested. An MRI could also be considered, although sensitivity for detection of osteomyelitis is decreased in the setting of concurrent neuropathic arthropathy. 2. Soft tissue wound or defect the lateral aspect of the foot near the fifth metatarsal base. Electronically Signed   By: Davina Poke D.O.   On: 09/07/2019 13:43     All images have been reviewed by me personally.  EKG: Normal sinus rhythm no acute ST-T changes  Assessment/Plan Principal Problem:   Cellulitis of left lower extremity Active Problems:   Charcot foot due to diabetes mellitus ([place])   Essential hypertension   Uncontrolled type 2 diabetes mellitus with diabetic polyneuropathy, without long-term current use of insulin ([facility])   Paroxysmal atrial fibrillation ([facility])   Chronic anticoagulation   AKI (acute kidney injury) ([place])   Left leg cellulitis   Sepsis ([place])   Sepsis secondary to left Lower extremity Cellulitis with some pruritis; POA B/L lower extremity chronic skin changes with blister -Admit to hospital.  Failed outpatient treatment with oral medication -UA, blood cultures ordered -IV vancomycin, cellulitis order set used -Pain control, bowel regimen -ABI ordered due to concerns of PAD.  Can get pain medicines if  necessary. -Routine wound care  Left shoulder and left ankle pain -X-rays ordered.  If necessary will get MRI -Pain control  Acute Kidney Injury on CKD Stage IIIa -Baseline creatinine 1.6, admission creatinine 2.4.  IV fluids, monitor urine output Some congestion on CXR but clinically appears dehydrated. Caution with fluids.   Essential Hypertension  -Metoprolol.  DM2 with neuropathy  -Insulin sliding scale and Accu-Chek.  Lantus 50 units twice daily -Gabapentin -Diabetic diet  Paroxysmal A Fib, s/p Cardioversion -On metoprolol, amiodarone and Eliquis   DVT prophylaxis: Eliquis Code Status: Full code Family Communication: None Consults called: None Admission status: Inpatient admission to telemetry.  Patient has failed outpatient treatment with oral antibiotic.  Despite of taking 2 days of oral doxycycline this is significantly worsened.  Not appropriate for outpatient treatment.     Time Spent: 65 minutes.  >50% of the time was devoted to discussing the patients care, assessment, plan and disposition with other care givers along with counseling the patient about the risks and benefits of treatment.    Paticia Moster Arsenio Loader MD [facility]  If 7PM-7AM, please contact night-coverage   09/07/2019, 2:35 PM

## 2019-09-07 NOTE — ED Notes (Signed)
Phlebotomy called for 2nd set of cultures

## 2019-09-07 NOTE — Consult Note (Addendum)
[facility] Nurse Consult Note: Reason for Consult: Consult requested for bilat legs.  Pt is followed by Dr Jacqualyn Posey of the podiatry service as an outpatient and was due for an appointment today, which was missed related to this admission. He has a chronic full thickness wound to right outer foot which previously had A-cell applied.  He developed erythremia, edema, and blistering and weeping yesterday to bilat legs and feet.  Yellow bulla and large amt yellow drainage and scattered partial thickness skin loss.   Right outer foot with full thickness wound with white A-cell intact over wound; 4.5X4.5cm, opening in the center is .2X.2X.3cm, red and moist.  Dressing procedure/placement/frequency: Pt is scheduled for an ABI to be performed, so I will not apply compression wraps at this time, or they would need to be cut off for that upcoming procedure.   Applied xeroform dressing over full thickness wound to right foot to protect until compression wraps are applied.   Topical treatment orders provided for bedside nurses to perform as follows to provide light compression and absorb drainage.  BEGIN TODAY WHEN ABI HAS BEEN COMPLETED:  Apply dressings to bilat legs Q day as follows: xeroform gauze to blistered areas of bilat legs and wound to right foot, then cover with ABD pads, then kerlex and ace wrap in a spiral fashion, beginning just behind toes to below knees. Please consult podiatry service if further orders are desired.  Please re-consult if further assistance is needed.  Thank-you,  Julien Girt MSN, [place], [place], [place], [place]

## 2019-09-07 NOTE — ED Notes (Signed)
EDP at bedside  

## 2019-09-07 NOTE — Consult Note (Signed)
Reason for Consult: Cellulitis  Referring Physician: Dr. Gerlean Ren, MD  Jeffery [name] is an 59 y.o. male.  HPI: Jeffery [name] is a 59 y.o. male with medical history significant of Obesity, DM2, HTN, P Afib, EtOH use, Charcot's foot, Chronic Pain syndrome, Hx of Cardioversion, diastolic CHF, previous right foot wound I&D with subsequent excision and ACell graft was admitted to the hospital for worsening depression lower extremity and for concerns of left shoulder pain.  Since his discharge from his previous admission he developed significant bursal edema resulting in blisters, wounds.  He is followed up with cardiology.  I saw him on Tuesday and prescribed antibiotics.  Follow-up cardiology as well.  He was placed to get repeat blood work he is not able to do this given the pain and not able to go to bed.  Left shoulder pain started yesterday.  He was brought to the hospital via EMS and was found to have increased white blood cell count at 19.4, creatinine 2.8, lactic acid 0.8.  He was started on Vanco and Zosyn.  Wound care has already been consulted.  Arterial studies are ordered.   Past Medical History:  Diagnosis Date  . Atrial fibrillation ([place])   . Cellulitis and abscess of foot 07/2019   RIGHT FOOT  . Charcot's joint of right foot   . DDD (degenerative disc disease), lumbar   . Diabetes mellitus without complication ([place])   . Fatty liver   . Hypertension   . Obesity   . Rotator cuff disorder   . Shoulder impingement, right   . Spinal stenosis at L4-L5 level     Past Surgical History:  Procedure Laterality Date  . AMPUTATION Left 10/02/2014   Procedure: Left Third toe amputation ;  Surgeon: [name], MD;  Location: [place];  Service: Orthopedics;  Laterality: Left;  Regular bed, wants to follow hip  . ANTERIOR CRUCIATE LIGAMENT REPAIR Right 90   reconstruction  . APPLICATION OF WOUND VAC Left 10/02/2014   Procedure: APPLICATION OF WOUND VAC; toe Surgeon: [name],  MD;  Location: [place];  Service: Orthopedics;  Laterality: Left;  . CARDIOVERSION N/A 04/26/2019   Procedure: CARDIOVERSION;  Surgeon: Pixie Casino, MD;  Location: [facility] ENDOSCOPY;  Service: Cardiovascular;  Laterality: N/A;  . CARDIOVERSION N/A 05/18/2019   Procedure: CARDIOVERSION (CATH LAB);  Surgeon: Constance Haw, MD;  Location: [place] CV LAB;  Service: Cardiovascular;  Laterality: N/A;  . I & D EXTREMITY Left 10/05/2014   Procedure: IRRIGATION AND DEBRIDEMENT LEFT FOOT;  Surgeon: [name], MD;  Location: [place];  Service: Orthopedics;  Laterality: Left;  . INCISION AND DRAINAGE Right 08/09/2019   Procedure: INCISION AND DRAINAGE RIGHT FOOT;  Surgeon: Trula Slade, DPM;  Location: [place];  Service: Podiatry;  Laterality: Right;  . KNEE ARTHROSCOPY W/ ACL RECONSTRUCTION Right   . TOTAL KNEE ARTHROPLASTY Right 03/28/2015  . TOTAL KNEE ARTHROPLASTY Right 03/28/2015   Procedure: RIGHT TOTAL KNEE ARTHROPLASTY;  Surgeon: [name], MD;  Location: [place];  Service: Orthopedics;  Laterality: Right;  . WOUND DEBRIDEMENT Right 08/14/2019   Procedure: DEBRIDEMENT WOUND;  Surgeon: Trula Slade, DPM;  Location: [place];  Service: Podiatry;  Laterality: Right;    Family History  Problem Relation Age of Onset  . Diabetes Father   . Hypertension Father   . Heart failure Father     Social History:  reports that he has quit smoking. His smoking use  included cigarettes. He smoked 0.00 packs per day for 38.00 years. He has never used smokeless tobacco. He reports current drug use. Drug: Marijuana. He reports that he does not drink alcohol.  Allergies:  Allergies  Allergen Reactions  . Metformin And Related Other (See Comments)    GI upset    Medications: Reviewed  Results for orders placed or performed during the hospital encounter of 09/07/19 (from the past 48 hour(s))  CBC with Differential     Status: Abnormal   Collection Time: 09/07/19 12:21 PM  Result Value Ref Range    WBC 19.4 (H) 4.0 - 10.5 K/uL   RBC 3.70 (L) 4.22 - 5.81 MIL/uL   Hemoglobin 10.1 (L) 13.0 - 17.0 g/dL   HCT 33.9 (L) 39 - 52 %   MCV 91.6 80.0 - 100.0 fL   MCH 27.3 26.0 - 34.0 pg   MCHC 29.8 (L) 30.0 - 36.0 g/dL   RDW 15.3 11.5 - 15.5 %   Platelets 506 (H) 150 - 400 K/uL   nRBC 0.0 0.0 - 0.2 %   Neutrophils Relative % 79 %   Neutro Abs 15.5 (H) 1.7 - 7.7 K/uL   Lymphocytes Relative 7 %   Lymphs Abs 1.3 0.7 - 4.0 K/uL   Monocytes Relative 12 %   Monocytes Absolute 2.3 (H) 0 - 1 K/uL   Eosinophils Relative 1 %   Eosinophils Absolute 0.1 0 - 0 K/uL   Basophils Relative 0 %   Basophils Absolute 0.1 0 - 0 K/uL   Immature Granulocytes 1 %   Abs Immature Granulocytes 0.17 (H) 0.00 - 0.07 K/uL    Comment: Performed at [facility], [place] [address], [place], [place] [address]  Comprehensive metabolic panel     Status: Abnormal   Collection Time: 09/07/19 12:21 PM  Result Value Ref Range   Sodium 137 135 - 145 mmol/L   Potassium 4.8 3.5 - 5.1 mmol/L   Chloride 102 98 - 111 mmol/L   CO2 24 22 - 32 mmol/L   Glucose, Bld 195 (H) 70 - 99 mg/dL    Comment: Glucose reference range applies only to samples taken after fasting for at least 8 hours.   BUN 42 (H) 6 - 20 mg/dL   Creatinine, Ser 2.48 (H) 0.61 - 1.24 mg/dL   Calcium 8.3 (L) 8.9 - 10.3 mg/dL   Total Protein 7.6 6.5 - 8.1 g/dL   Albumin 2.3 (L) 3.5 - 5.0 g/dL   AST 24 15 - 41 U/L   ALT 31 0 - 44 U/L   Alkaline Phosphatase 84 38 - 126 U/L   Total Bilirubin 0.9 0.3 - 1.2 mg/dL   GFR calc non Af Amer 28 (L) >60 mL/min   GFR calc Af Amer 32 (L) >60 mL/min   Anion gap 11 5 - 15    Comment: Performed at [facility], [place] [address] [place], [place] [address]  CK     Status: None   Collection Time: 09/07/19 12:21 PM  Result Value Ref Range   Total CK 322 49.0 - 397.0 U/L    Comment: Performed at [facility], [place] [address], [place], [place] [address]  Lactic acid, plasma      Status: None   Collection Time: 09/07/19 12:21 PM  Result Value Ref Range   Lactic Acid, Venous 0.8 0.5 - 1.9 mmol/L    Comment: Performed at [facility], [place] [address] [place], [place]  [address]  SARS Coronavirus 2 by RT PCR (hospital order, performed in [facility] hospital lab) Nasopharyngeal Nasopharyngeal Swab     Status: None   Collection Time: 09/07/19  2:37 PM   Specimen: Nasopharyngeal Swab  Result Value Ref Range   SARS Coronavirus 2 NEGATIVE NEGATIVE    Comment: (NOTE) SARS-CoV-2 target nucleic acids are NOT DETECTED.  The SARS-CoV-2 RNA is generally detectable in upper and lower respiratory specimens during the acute phase of infection. The lowest concentration of SARS-CoV-2 viral copies this assay can detect is 250 copies / mL. A negative result does not preclude SARS-CoV-2 infection and should not be used as the sole basis for treatment or other patient management decisions.  A negative result may occur with improper specimen collection / handling, submission of specimen other than nasopharyngeal swab, presence of viral mutation(s) within the areas targeted by this assay, and inadequate number of viral copies (<250 copies / mL). A negative result must be combined with clinical observations, patient history, and epidemiological information.  Fact Sheet for Patients:   StrictlyIdeas.no  Fact Sheet for Healthcare Providers: BankingDealers.co.za  This test is not yet approved or  cleared by the [place] FDA and has been authorized for detection and/or diagnosis of SARS-CoV-2 by FDA under an Emergency Use Authorization (EUA).  This EUA will remain in effect (meaning this test can be used) for the duration of the COVID-19 declaration under Section 564(b)(1) of the Act, 21 U.S.C. section 360bbb-3(b)(1), unless the authorization is terminated or revoked sooner.  Performed at [facility], [place] [address] [place],  [address]   CBG monitoring, ED     Status: Abnormal   Collection Time: 09/07/19  5:18 PM  Result Value Ref Range   Glucose-Capillary 151 (H) 70 - 99 mg/dL    Comment: Glucose reference range applies only to samples taken after fasting for at least 8 hours.    DG Chest 2 View  Result Date: 09/07/2019 CLINICAL DATA:  Left shoulder pain. EXAM: CHEST - 2 VIEW COMPARISON:  PA and lateral chest 10/24/2017. FINDINGS: There is cardiomegaly and mild interstitial edema. No consolidative process, pneumothorax or effusion. No acute or focal bony abnormality. IMPRESSION: Cardiomegaly and interstitial edema. Electronically Signed   By: Inge Rise M.D.   On: 09/07/2019 14:40   DG Shoulder Left  Result Date: 09/07/2019 CLINICAL DATA:  Left shoulder pain EXAM: LEFT SHOULDER - 2+ VIEW COMPARISON:  03/01/2018 FINDINGS: There is no evidence of fracture or dislocation. Mild-to-moderate arthropathy of the [facility] joint. Glenohumeral joint is within normal limits. Soft tissues are unremarkable. Vascular congestion and interstitial opacities within the visualized left lung. IMPRESSION: 1. No acute osseous abnormality. Mild-to-moderate arthropathy of the left AC joint. 2. Vascular congestion and interstitial opacities within the visualized left lung. Suggest dedicated PA and lateral chest radiographs. Electronically Signed   By: Davina Poke D.O.   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 No acute fracture identified. Pes planus. Moderate arthropathy at the tarsometatarsal joints with prominent dorsal hypertrophy. Chronic deformity of the second digit proximal phalanx. No cortical destruction or periostitis is seen. There is diffuse soft tissue prominence. No soft tissue gas. IMPRESSION: 1. No  acute osseous abnormality. No radiographic evidence of acute osteomyelitis. 2. Diffuse soft tissue prominence. 3. Moderate arthropathy at the  tarsometatarsal joints. Electronically Signed   By: Davina Poke D.O.   On: 09/07/2019 13:38   DG Foot Complete Right  Result Date: 09/07/2019 CLINICAL DATA:  Chronic right foot pain EXAM: RIGHT FOOT COMPLETE - 3+ VIEW COMPARISON:  08/07/2019 FINDINGS: Chronic findings of Charcot arthropathy of the right midfoot with fragmentation and remodeling of the osseous structures. No appreciable interval progression from prior radiograph. No new or acute fracture identified. There is a appears to be a soft tissue wound or defect the lateral aspect of the foot near the fifth metatarsal base. Progressive destructive changes at the fourth and fifth metatarsal bases could reflect changes related to progressive Charcot arthropathy versus osteomyelitis. IMPRESSION: 1. Findings of Charcot arthropathy of the right midfoot with progressive destructive changes at the fourth and fifth metatarsal bases could reflect changes related to progressive Charcot arthropathy versus osteomyelitis. Correlation with serum inflammatory markers is suggested. An MRI could also be considered, although sensitivity for detection of osteomyelitis is decreased in the setting of concurrent neuropathic arthropathy. 2. Soft tissue wound or defect the lateral aspect of the foot near the fifth metatarsal base. Electronically Signed   By: Davina Poke D.O.   On: 09/07/2019 13:43    Review of Systems Blood pressure (!) 145/93, pulse 72, temperature 99.2 F (37.3 C), temperature source Oral, resp. rate 20, SpO2 96 %. Physical Exam General: Laying in hospital bed with chills  Dermatological: Significant blistering present to bilateral lower extremities and there is erythema mostly to the dorsal aspect of the left foot.  Chronic full-thickness ulceration with ACell application with dorsal lateral right  foot.  There is no fluctuation or drainage coming from this area.  There is partial thickness skin loss to bilateral lower extremities.  Vascular: Difficult to palpate pulses today given swelling.  Previously palpable last admission.  Neruologic: Sensation decreased  Musculoskeletal: There is no significant palpation to lower extremities.   Assessment/Plan: Sepsis secondary to left lower extremity cellulitis; lower extremity edema  I reviewed the x-ray as well as lab work.  I did discuss the treatment options with him. Continue IV antibiotics monitor clinical progress.  Awaiting arterial studies although he did have palpable toes previously but difficult to evaluate today given the swelling.  Encouraged elevation.  Agree with wound care treatment plan pending arterial studies.  Consider MRI.  The right graft appears to be doing well and always other issues to the leg seem to be more of an issue currently.  Podiatry service will continue to follow  Trula Slade 09/07/2019, 7:35 PM  O: [phone] C: [phone]

## 2019-09-07 NOTE — Telephone Encounter (Signed)
Pt called and cancelled his f/u appt for today stating he is heading to the hospital to have some tests done. Pt is currently on an antibiotic and has been experiencing severe pain and was instructed by his Cardiologist to go in to have some labs and test done to check all of his vitals  Pt is requesting a call back from Dr. Jacqualyn Posey to discuss

## 2019-09-07 NOTE — Progress Notes (Signed)
A consult was received from an ED physician for vancomycin and Zosyn per pharmacy dosing (for an indication other than meningitis). The patient's profile has been reviewed for ht/wt/allergies/indication/available labs. A one time order has been placed for the above antibiotics.  Further antibiotics/pharmacy consults should be ordered by admitting physician if indicated.                       Reuel Boom, PharmD, BCPS [phone] 09/07/2019, 1:45 PM

## 2019-09-07 NOTE — ED Provider Notes (Signed)
[facility] DEPT Provider Note   CSN: [id number] Arrival date & time: 09/07/19  1104     History Chief Complaint  Patient presents with  . Leg Swelling    Jeffery [name] is a 59 y.o. male presenting for evaluation of left shoulder pain.  Patient states over the past 2 days, he has had severe debilitating left shoulder pain.  He has a history of chronic pain in the shoulder, but states has been worse over the past 2 days.  It is to the point where he is unable to get out of bed and go to his doctor's appointments as scheduled.  This began after he took Kayexalate 2 days ago, and he thinks this is the reason why his pain is worse.  He denies recent fall, trauma, or injury.  Patient has had significant medical issues in the past several months, including A. fib that he was cardioverted for, foot infection resulting in sepsis and partial amputation of the right foot, and significant swelling with weeping of bilateral legs.  He takes Percocet for pain, states this has not been helping his shoulder pain.  He denies fevers or chills.  He denies chest pain, shortness of breath, nausea, vomiting.  He also has pain in his left foot, which has been worsened over the past several days.  He is on doxycycline per podiatry for foot infection.  He sees Dr. Jacqualyn Posey, was supposed to have an apt today. Of note, pt's K was found to be elevated at 6.1 on 6/7. He was supposed to get his labs rechecked, but couldn't get out of bed to [facility], thus he came to the ED.   Additional history obtained from chart review.  Patient with a history of A. Fib on eliquis, cellulitis, Charcot of the right foot, degenerative disc disease, diabetes, hypertension, obesity, left rotator cuff problems/shoulder impingement.   HPI     Past Medical History:  Diagnosis Date  . Atrial fibrillation ([place])   . Cellulitis and abscess of foot 07/2019   RIGHT FOOT  . Charcot's joint of right foot   . DDD  (degenerative disc disease), lumbar   . Diabetes mellitus without complication ([place])   . Fatty liver   . Hypertension   . Obesity   . Rotator cuff disorder   . Shoulder impingement, right   . Spinal stenosis at L4-L5 level     Patient Active Problem List   Diagnosis Date Noted  . Left leg cellulitis 09/07/2019  . Sepsis ([place]) 09/07/2019  . Right foot ulcer, with fat layer exposed ([place]) 08/08/2019  . Cellulitis of right lower extremity 08/08/2019  . Mixed diabetic hyperlipidemia associated with type 2 diabetes mellitus ([place]) 08/08/2019  . Severe sepsis with acute organ dysfunction due to methicillin susceptible Staphylococcus aureus (MSSA) ([place]) 08/08/2019  . AKI (acute kidney injury) ([place]) 08/08/2019  . Sepsis due to methicillin susceptible Staphylococcus aureus (MSSA) with acute renal failure ([place]) 08/08/2019  . Persistent atrial fibrillation ([place])   . Testosterone deficiency 03/16/2018  . Chronic left shoulder pain 03/01/2018  . Body mass index 40.0-44.9, adult ([place]) 03/01/2018  . DDD (degenerative disc disease), lumbar 12/15/2017  . Chronic anticoagulation 10/26/2017  . Dyspnea 10/26/2017  . S/P right rotator cuff repair 04/05/2017  . Achilles tendon contracture, right 01/05/2017  . Closed nondisplaced fracture of distal phalanx of right great toe 01/05/2017  . Paroxysmal atrial fibrillation ([place]) 10/30/2016  . Pain in right ankle and joints of right foot 07/09/2016  .  Uncontrolled type 2 diabetes mellitus with diabetic polyneuropathy, without long-term current use of insulin ([place]) 07/09/2016  . 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 Impingement syndrome of right shoulder 07/07/2016  . Morbid obesity ([place]) 06/05/2016  . S/P TKR (total knee replacement), right 03/28/2015  . Knee osteoarthritis 11/12/2014  . Essential hypertension 10/15/2014  . Cellulitis of left lower extremity   . Charcot foot due to diabetes mellitus  ([place]) 10/01/2014  . Accelerated hypertension 10/01/2014  . Gangrene left third toe 10/01/2014    Past Surgical History:  Procedure Laterality Date  . AMPUTATION Left 10/02/2014   Procedure: Left Third toe amputation ;  Surgeon: [name], MD;  Location: [facility];  Service: Orthopedics;  Laterality: Left;  Regular bed, wants to follow hip  . ANTERIOR CRUCIATE LIGAMENT REPAIR Right 90   reconstruction  . APPLICATION OF WOUND VAC Left 10/02/2014   Procedure: APPLICATION OF WOUND VAC; toe Surgeon: [name], MD;  Location: [facility];  Service: Orthopedics;  Laterality: Left;  . CARDIOVERSION N/A 04/26/2019   Procedure: CARDIOVERSION;  Surgeon: Pixie Casino, MD;  Location: [facility] ENDOSCOPY;  Service: Cardiovascular;  Laterality: N/A;  . CARDIOVERSION N/A 05/18/2019   Procedure: CARDIOVERSION (CATH LAB);  Surgeon: Constance Haw, MD;  Location: [facility] CV LAB;  Service: Cardiovascular;  Laterality: N/A;  . I & D EXTREMITY Left 10/05/2014   Procedure: IRRIGATION AND DEBRIDEMENT LEFT FOOT;  Surgeon: [name], MD;  Location: [facility];  Service: Orthopedics;  Laterality: Left;  . INCISION AND DRAINAGE Right 08/09/2019   Procedure: INCISION AND DRAINAGE RIGHT FOOT;  Surgeon: Trula Slade, DPM;  Location: [facility];  Service: Podiatry;  Laterality: Right;  . KNEE ARTHROSCOPY W/ ACL RECONSTRUCTION Right   . TOTAL KNEE ARTHROPLASTY Right 03/28/2015  . TOTAL KNEE ARTHROPLASTY Right 03/28/2015   Procedure: RIGHT TOTAL KNEE ARTHROPLASTY;  Surgeon: [name], MD;  Location: [facility];  Service: Orthopedics;  Laterality: Right;  . WOUND DEBRIDEMENT Right 08/14/2019   Procedure: DEBRIDEMENT WOUND;  Surgeon: Trula Slade, DPM;  Location: [facility];  Service: Podiatry;  Laterality: Right;       Family History  Problem Relation Age of Onset  . Diabetes Father   . Hypertension Father   . Heart failure Father     Social History   Tobacco Use  . Smoking status: Former Smoker    Packs/day: 0.00     Years: 38.00    Pack years: 0.00    Types: Cigarettes  . Smokeless tobacco: Never Used  . Tobacco comment: QUIT 10/2016  Vaping Use  . Vaping Use: Never used  Substance Use Topics  . Alcohol use: No    Alcohol/week: 5.0 - 6.0 standard drinks    Types: 5 - 6 Cans of beer per week  . Drug use: Yes    Types: Marijuana    Comment: last week ; denies 03/24/17    Home Medications Prior to Admission medications   Medication Sig Start Date End Date Taking? Authorizing Provider  amiodarone (PACERONE) 200 MG tablet Take 200 mg by mouth 2 (two) times daily.    Yes [provider]  apixaban (ELIQUIS) 5 MG TABS tablet Take 1 tablet (5 mg total) by mouth 2 (two) times daily. 06/20/19  Yes [name], MD  atorvastatin (LIPITOR) 20 MG tablet Take 1 tablet (20 mg total) by mouth daily. 03/28/19  Yes [name], MD  doxycycline (VIBRA-TABS) 100  MG tablet Take 1 tablet (100 mg total) by mouth 2 (two) times daily. 09/05/19  Yes Trula Slade, DPM  furosemide (LASIX) 40 MG tablet Take 1 tablet (40 mg total) by mouth daily. Patient taking differently: Take 40-80 mg by mouth See admin instructions. Per MD pt started taking 80mg  09/06/19 x3days then he is to resume back to 40mg  QD 04/21/19 09/07/19 Yes Troy Sine, MD  gabapentin (NEURONTIN) 300 MG capsule Take 1 capsule (300 mg total) by mouth 2 (two) times daily. Patient taking differently: Take 600 mg by mouth at bedtime as needed (pain).  03/28/19  Yes Newlin, Charlane Ferretti, MD  insulin glargine (LANTUS SOLOSTAR) 100 UNIT/ML Solostar Pen Inject 50 Units into the skin 2 (two) times daily. Patient taking differently: Inject 51 Units into the skin 2 (two) times daily.  06/20/19  Yes Newlin, Charlane Ferretti, MD  insulin lispro (INSULIN LISPRO) 100 UNIT/ML KwikPen Junior Inject 0.08 mLs (8 Units total) into the skin 3 (three) times daily. Patient taking differently: Inject 8 Units into the skin 3 (three) times daily as needed (Low blood sugar).  03/28/19   Yes [name], MD  metoprolol tartrate (LOPRESSOR) 100 MG tablet Take 0.5 tablets (50 mg total) by mouth 2 (two) times daily. 08/24/19 11/22/19 Yes Camnitz, Will Hassell Done, MD  oxyCODONE-acetaminophen (PERCOCET) 10-325 MG tablet Take 1 tablet by mouth every 4 (four) hours as needed for pain.    Yes [provider]  polyethylene glycol (MIRALAX / GLYCOLAX) 17 g packet Take 17 g by mouth daily as needed for mild constipation. 08/16/19  Yes Nita Sells, MD  Sennosides (EX-LAX) 15 MG TABS Take 15 mg by mouth daily as needed (Constipation).   Yes [provider]  sodium chloride (OCEAN) 0.65 % SOLN nasal spray Place 1 spray into both nostrils daily as needed for congestion.    Yes [provider]  Accu-Chek FastClix Lancets MISC Use as directed to check blood sugar at least twice daily. E11.8 E11.65 Z79.4 Patient taking differently: 1 each by Other route See admin instructions. Use as directed to check blood sugar at least twice daily. E11.8 E11.65 Z79.4 10/18/18   [name], MD  Blood Glucose Monitoring Suppl (ACCU-CHEK AVIVA) device Use as instructed to check blood sugar 2 times daily. E11.8 E11.65 Z79.4 Patient taking differently: 1 each by Other route See admin instructions. Use as instructed to check blood sugar 2 times daily. E11.8 E11.65 Z79.4 10/18/18 10/18/19  [name], MD  glucose blood (ACCU-CHEK AVIVA) test strip Use as instructed to check blood sugar 2 times daily. E11.8 E11.65 Z79.4 Patient taking differently: 1 each by Other route See admin instructions. Use as instructed to check blood sugar 2 times daily. E11.8 E11.65 Z79.4 10/18/18   [name], MD  Insulin Pen Needle (B-D ULTRAFINE III SHORT PEN) 31G X 8 MM MISC 1 each by Does not apply route 3 (three) times daily. 10/12/17   [name], MD  Insulin Syringe-Needle U-100 (TRUEPLUS INSULIN SYRINGE) 30G X 5/16" 0.5 ML MISC Use as directed 3 times daily Patient taking differently: 1 each  by Other route See admin instructions. Use as directed 3 times daily 10/06/17   [name], MD  liraglutide (VICTOZA) 18 MG/3ML SOPN Inject 0.3 mLs (1.8 mg total) into the skin daily. Patient not taking: Reported on 09/07/2019 06/20/19   [name], MD  TRUEPLUS INSULIN SYRINGE 30G X 5/16" 0.5 ML MISC USE AS DIRECTED 3 TIMES DAILY Patient taking differently: 1 each by Other route in  the morning, at noon, and at bedtime.  04/03/16   Tresa Garter, MD    Allergies    Metformin and related  Review of Systems   Review of Systems  Musculoskeletal: Positive for arthralgias.  Skin: Positive for color change.  Hematological: Bruises/bleeds easily.  All other systems reviewed and are negative.   Physical Exam Updated Vital Signs BP (!) 121/104 (BP Location: Left Arm)   Pulse 69   Temp 98.5 F (36.9 C) (Oral)   Resp 16   SpO2 99%   Physical Exam Vitals and nursing note reviewed.  Constitutional:      General: He is not in acute distress.    Appearance: He is well-developed.     Comments: Appears chronically ill  HENT:     Head: Normocephalic and atraumatic.  Eyes:     Conjunctiva/sclera: Conjunctivae normal.     Pupils: Pupils are equal, round, and reactive to light.  Cardiovascular:     Rate and Rhythm: Normal rate and regular rhythm.     Pulses: Normal pulses.  Pulmonary:     Effort: Pulmonary effort is normal. No respiratory distress.     Breath sounds: Normal breath sounds. No wheezing.  Abdominal:     General: There is no distension.     Palpations: Abdomen is soft. There is no mass.     Tenderness: There is no abdominal tenderness. There is no guarding or rebound.  Musculoskeletal:        General: Tenderness present.     Cervical back: Normal range of motion and neck supple.     Comments: No erythema, warmth, or swelling of the left shoulder.  No rash.  Focal tenderness to palpation of the [facility] joint. Pt holding his arm in a splinted position due to pain.    Bilateral pedal edema with open blisters and weeping from both legs.  Purulent drainage coming from the left foot.  Erythema of the L foot.  Skin:    General: Skin is warm and dry.     Capillary Refill: Capillary refill takes less than 2 seconds.  Neurological:     Mental Status: He is alert and oriented to person, place, and time.     ED Results / Procedures / Treatments   Labs (all labs ordered are listed, but only abnormal results are displayed) Labs Reviewed  CBC WITH DIFFERENTIAL/PLATELET - Abnormal; Notable for the following components:      Result Value   WBC 19.4 (*)    RBC 3.70 (*)    Hemoglobin 10.1 (*)    HCT 33.9 (*)    MCHC 29.8 (*)    Platelets 506 (*)    Neutro Abs 15.5 (*)    Monocytes Absolute 2.3 (*)    Abs Immature Granulocytes 0.17 (*)    All other components within normal limits  COMPREHENSIVE METABOLIC PANEL - Abnormal; Notable for the following components:   Glucose, Bld 195 (*)    BUN 42 (*)    Creatinine, Ser 2.48 (*)    Calcium 8.3 (*)    Albumin 2.3 (*)    GFR calc non Af Amer 28 (*)    GFR calc Af Amer 32 (*)    All other components within normal limits  SARS CORONAVIRUS 2 BY RT PCR (HOSPITAL ORDER, [place] [facility])  CULTURE, BLOOD (ROUTINE X 2)  CULTURE, BLOOD (ROUTINE X 2)  CK  LACTIC ACID, PLASMA  HEMOGLOBIN A1C  URINALYSIS, ROUTINE W REFLEX MICROSCOPIC  EKG EKG Interpretation  Date/Time:  Thursday September 07 2019 11:23:28 EDT Ventricular Rate:  71 PR Interval:    QRS Duration: 116 QT Interval:  416 QTC Calculation: 453 R Axis:   -13 Text Interpretation: Sinus rhythm Nonspecific intraventricular conduction delay Low voltage, precordial leads No STEMI Confirmed by Octaviano Glow [phone]) on 09/07/2019 11:29:38 AM   Radiology DG Chest 2 View  Result Date: 09/07/2019 CLINICAL DATA:  Left shoulder pain. EXAM: CHEST - 2 VIEW COMPARISON:  PA and lateral chest 10/24/2017. FINDINGS: There is cardiomegaly and mild  interstitial edema. No consolidative process, pneumothorax or effusion. No acute or focal bony abnormality. IMPRESSION: Cardiomegaly and interstitial edema. Electronically Signed   By: Inge Rise M.D.   On: 09/07/2019 14:40   DG Shoulder Left  Result Date: 09/07/2019 CLINICAL DATA:  Left shoulder pain EXAM: LEFT SHOULDER - 2+ VIEW COMPARISON:  03/01/2018 FINDINGS: There is no evidence of fracture or dislocation. Mild-to-moderate arthropathy of the [facility] joint. Glenohumeral joint is within normal limits. Soft tissues are unremarkable. Vascular congestion and interstitial opacities within the visualized left lung. IMPRESSION: 1. No acute osseous abnormality. Mild-to-moderate arthropathy of the left AC joint. 2. Vascular congestion and interstitial opacities within the visualized left lung. Suggest dedicated PA and lateral chest radiographs. Electronically Signed   By: Davina Poke D.O.   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 No acute fracture identified. Pes planus. Moderate arthropathy at the tarsometatarsal joints with prominent dorsal hypertrophy. Chronic deformity of the second digit proximal phalanx. No cortical destruction or periostitis is seen. There is diffuse soft tissue prominence. No soft tissue gas. IMPRESSION: 1. No acute osseous abnormality. No radiographic evidence of acute osteomyelitis. 2. Diffuse soft tissue prominence. 3. Moderate arthropathy at the tarsometatarsal joints. Electronically Signed   By: Davina Poke D.O.   On: 09/07/2019 13:38   DG Foot Complete Right  Result Date: 09/07/2019 CLINICAL DATA:  Chronic right foot pain EXAM: RIGHT FOOT COMPLETE - 3+ VIEW COMPARISON:  08/07/2019 FINDINGS: Chronic findings of Charcot arthropathy of the right midfoot with fragmentation and remodeling of  the osseous structures. No appreciable interval progression from prior radiograph. No new or acute fracture identified. There is a appears to be a soft tissue wound or defect the lateral aspect of the foot near the fifth metatarsal base. Progressive destructive changes at the fourth and fifth metatarsal bases could reflect changes related to progressive Charcot arthropathy versus osteomyelitis. IMPRESSION: 1. Findings of Charcot arthropathy of the right midfoot with progressive destructive changes at the fourth and fifth metatarsal bases could reflect changes related to progressive Charcot arthropathy versus osteomyelitis. Correlation with serum inflammatory markers is suggested. An MRI could also be considered, although sensitivity for detection of osteomyelitis is decreased in the setting of concurrent neuropathic arthropathy. 2. Soft tissue wound or defect the lateral aspect of the foot near the fifth metatarsal base. Electronically Signed   By: Davina Poke D.O.   On: 09/07/2019 13:43    Procedures Procedures (including critical care time)  Medications Ordered in ED Medications  vancomycin (VANCOREADY) IVPB 2000 mg/400 mL (has no administration in time range)  amiodarone (PACERONE) tablet 200 mg (has no administration in time range)  atorvastatin (LIPITOR) tablet 20 mg (has no administration in time range)  metoprolol tartrate (LOPRESSOR) tablet 50 mg (has no  administration in time range)  insulin glargine (LANTUS) Solostar Pen 50 Units (has no administration in time range)  polyethylene glycol (MIRALAX / GLYCOLAX) packet 17 g (has no administration in time range)  apixaban (ELIQUIS) tablet 5 mg (has no administration in time range)  gabapentin (NEURONTIN) capsule 300 mg (has no administration in time range)  0.9 %  sodium chloride infusion (has no administration in time range)  acetaminophen (TYLENOL) tablet 650 mg (has no administration in time range)    Or  acetaminophen (TYLENOL)  suppository 650 mg (has no administration in time range)  morphine 2 MG/ML injection 2 mg (has no administration in time range)  senna-docusate (Senokot-S) tablet 1 tablet (has no administration in time range)  bisacodyl (DULCOLAX) EC tablet 5 mg (has no administration in time range)  ondansetron (ZOFRAN) tablet 4 mg (has no administration in time range)    Or  ondansetron (ZOFRAN) injection 4 mg (has no administration in time range)  insulin aspart (novoLOG) injection 0-15 Units (has no administration in time range)  insulin aspart (novoLOG) injection 0-5 Units (has no administration in time range)  oxyCODONE-acetaminophen (PERCOCET/ROXICET) 5-325 MG per tablet 1 tablet (has no administration in time range)    And  oxyCODONE (Oxy IR/ROXICODONE) immediate release tablet 5 mg (has no administration in time range)  vancomycin (VANCOREADY) IVPB 1750 mg/350 mL (has no administration in time range)  HYDROmorphone (DILAUDID) injection 1 mg (1 mg Intravenous Given 09/07/19 1221)  HYDROmorphone (DILAUDID) injection 1 mg (1 mg Intravenous Given 09/07/19 1426)  piperacillin-tazobactam (ZOSYN) IVPB 3.375 g (3.375 g Intravenous New Bag/Given (Non-Interop) 09/07/19 1426)    ED Course  I have reviewed the triage vital signs and the nursing notes.  Pertinent labs & imaging results that were available during my care of the patient were reviewed by me and considered in my medical decision making (see chart for details).  Clinical Course as of Sep 06 1512  Thu Sep 07, 2019  1336 59 yo male presenting with LE and chronic left shoulder pain presenting to ED with worsening shoulder and leg pain.  On exam he is tender with the muscles surrounding the shoulder (apparently he was supposed ot have rotator cuff surgery last winter but it was cancelled due to A Fib).  He also has significant weeping wounds of the LE with blistering, erythema and bullae of LLE > RLE, the bedsheets are soaked.  He has diabetic ulcers of  the toes which appear deep and raise concern for possible osteomyelitis.  He has a WBC 19.4.  Xrays pending.  Plan for IV antibiotics, IV pain medication, and admission for cellulitis coverage and also for a developing AKI (likely prerenal from fluid loss).   [MT]    Clinical Course User Index [MT] Wyvonnia Dusky, MD   MDM Rules/Calculators/A&P                          Pt presenting for evaluation of left shoulder and left foot pain.  On exam, patient appears chronically ill.  He is extremely uncomfortable with palpation of the left shoulder, however exam is not consistent with septic joint.  On evaluation of patient's feet, there is significant edema and weeping.  He does have purulent drainage between the toes of his left foot, and considering worsening pain despite being on antibiotics, I am concerned about diabetic foot infection with failure of outpatient treatment.  Will obtain x-rays of the shoulder and both feet.  Will obtain labs to look for infection.  Labs interpreted by me, shows worsening leukocytosis of 19.  Additionally, patient has an AKI at 2.48, baseline close to 1.3.  Lactic is normal.  Shoulder x-ray viewed interpreted by me, no obvious fracture dislocation.  No obvious mass.  Per radiology read, there is arthropathy.  They do see opacities in the lung, recommends chest x-ray.  Foot x-ray viewed interpreted by me, right foot shows degenerative disease/Charcot.  Unable to completely rule out infection.  Left foot x-ray without obvious osteo.  Case discussed with attending, Dr. Langston Masker evaluated the patient.  Due to my concerns for worsening diabetic foot infection as well as a new AKI, will call for admission. Will start vanc and zosyn for infection.   Discussed with Dr. Reesa Chew from [facility] hospitalist service, patient to be admitted.  Final Clinical Impression(s) / ED Diagnoses Final diagnoses:  AKI (acute kidney injury) ([place])  Diabetic infection of right foot ([place])  Diabetic  infection of left foot [facility])    Rx / [place] Orders ED Discharge Orders    None       Franchot Heidelberg, PA-C 09/07/19 1514    Wyvonnia Dusky, MD 09/07/19 1844

## 2019-09-07 NOTE — Telephone Encounter (Signed)
New Message  Pt is calling and says he is having very bad left shoulder pain, left knee pain. Pt says it hurts so bad he cant stand it.  Pt says he is having no other symptoms   Please advise

## 2019-09-08 ENCOUNTER — Inpatient Hospital Stay (HOSPITAL_COMMUNITY): Payer: Medicaid Other

## 2019-09-08 ENCOUNTER — Encounter (HOSPITAL_COMMUNITY): Payer: Medicaid Other

## 2019-09-08 ENCOUNTER — Telehealth: Payer: Self-pay | Admitting: Licensed Clinical Social Worker

## 2019-09-08 DIAGNOSIS — L02619 Cutaneous abscess of unspecified foot: Secondary | ICD-10-CM

## 2019-09-08 LAB — COMPREHENSIVE METABOLIC PANEL
ALT: 23 U/L (ref 0–44)
AST: 21 U/L (ref 15–41)
Albumin: 1.9 g/dL — ABNORMAL LOW (ref 3.5–5.0)
Alkaline Phosphatase: 64 U/L (ref 38–126)
Anion gap: 11 (ref 5–15)
BUN: 47 mg/dL — ABNORMAL HIGH (ref 6–20)
CO2: 20 mmol/L — ABNORMAL LOW (ref 22–32)
Calcium: 8 mg/dL — ABNORMAL LOW (ref 8.9–10.3)
Chloride: 102 mmol/L (ref 98–111)
Creatinine, Ser: 2.37 mg/dL — ABNORMAL HIGH (ref 0.61–1.24)
GFR calc Af Amer: 34 mL/min — ABNORMAL LOW (ref >60)
GFR calc non Af Amer: 29 mL/min — ABNORMAL LOW (ref >60)
Glucose, Bld: 124 mg/dL — ABNORMAL HIGH (ref 70–99)
Potassium: 4.6 mmol/L (ref 3.5–5.1)
Sodium: 133 mmol/L — ABNORMAL LOW (ref 135–145)
Total Bilirubin: 0.8 mg/dL (ref 0.3–1.2)
Total Protein: 6.2 g/dL — ABNORMAL LOW (ref 6.5–8.1)

## 2019-09-08 LAB — CBC
HCT: 27.5 % — ABNORMAL LOW (ref 39.0–52.0)
Hemoglobin: 8.5 g/dL — ABNORMAL LOW (ref 13.0–17.0)
MCH: 27.9 pg (ref 26.0–34.0)
MCHC: 30.9 g/dL (ref 30.0–36.0)
MCV: 90.2 fL (ref 80.0–100.0)
Platelets: 406 10*3/uL — ABNORMAL HIGH (ref 150–400)
RBC: 3.05 MIL/uL — ABNORMAL LOW (ref 4.22–5.81)
RDW: 15.6 % — ABNORMAL HIGH (ref 11.5–15.5)
WBC: 16.5 10*3/uL — ABNORMAL HIGH (ref 4.0–10.5)
nRBC: 0 % (ref 0.0–0.2)

## 2019-09-08 LAB — GLUCOSE, CAPILLARY
Glucose-Capillary: 108 mg/dL — ABNORMAL HIGH (ref 70–99)
Glucose-Capillary: 107 mg/dL — ABNORMAL HIGH (ref 70–99)
Glucose-Capillary: 144 mg/dL — ABNORMAL HIGH (ref 70–99)
Glucose-Capillary: 126 mg/dL — ABNORMAL HIGH (ref 70–99)
Glucose-Capillary: 131 mg/dL — ABNORMAL HIGH (ref 70–99)

## 2019-09-08 LAB — MAGNESIUM: Magnesium: 2.4 mg/dL (ref 1.7–2.4)

## 2019-09-08 MED ORDER — AMIODARONE HCL 200 MG PO TABS
200.0000 mg | ORAL_TABLET | Freq: Two times a day (BID) | ORAL | Status: DC
  Administered 2019-09-09 – 2019-09-28 (×36): 200 mg via ORAL
  Filled 2019-09-08 (×40): qty 1

## 2019-09-08 MED ORDER — METOPROLOL TARTRATE 25 MG PO TABS
50.0000 mg | ORAL_TABLET | Freq: Two times a day (BID) | ORAL | Status: DC
  Administered 2019-09-10: 50 mg via ORAL
  Filled 2019-09-08 (×3): qty 2

## 2019-09-08 NOTE — Progress Notes (Signed)
PROGRESS NOTE    Jeffery [name]  KYH:[id number] DOB: 10-23-1960 DOA: 09/07/2019 PCP: [name], MD   Brief Narrative:  59 y.o. male with medical history significant of Obesity, DM2, HTN, P Afib, EtOH use, Charcot's foot, Chronic Pain syndrome, Hx of Cardioversion, diastolic CHF, chronic LE blister and edema s/p excision by podiatry comes with worsening of left lower ext foot pain.  Admitted for sepsis secondary to purulent left lower extremity cellulitis.   Assessment & Plan:   Principal Problem:   Cellulitis of left lower extremity Active Problems:   Charcot foot due to diabetes mellitus ([place])   Essential hypertension   Uncontrolled type 2 diabetes mellitus with diabetic polyneuropathy, without long-term current use of insulin (HCC)   Paroxysmal atrial fibrillation (HCC)   Chronic anticoagulation   AKI (acute kidney injury) ([place])   Left leg cellulitis   Sepsis ([place])  Sepsis secondary to left Lower extremity Cellulitis with some purulent; POA B/L lower extremity chronic skin changes with blister -Sepsis physiology improving. -UA, blood cultures ordered -IV vancomycin, cellulitis order set used -Pain control, bowel regimen -Ultrasound ABI-might be difficult given blister presentation -Routine wound care  Left shoulder and left ankle pain -X-rays negative for acute pathology shows multiple chronic changes. -Doing better today therefore we will hold off on MRI -Pain control -Continue work with PT/OT  Acute Kidney Injury on CKD Stage IIIa -Baseline creatinine 1.6, admission creatinine 2.4.  Creatinine improved to 2.37.  Essential Hypertension  -Metoprolol.  DM2 with neuropathy , in decent range 108-182 -Insulin sliding scale and Accu-Chek.  Lantus 50 units twice daily -Gabapentin -Diabetic diet  Paroxysmal A Fib, s/p Cardioversion -On metoprolol, amiodarone and Eliquis     DVT prophylaxis: Eliquis Code Status: Full code Family Communication:   Called Mrs Kai, no answer. Left a voicemail.   Status is: Inpatient  Remains inpatient appropriate because:IV treatments appropriate due to intensity of illness or inability to take PO   Dispo: The patient is from: Home              Anticipated d/c is to: Home              Anticipated d/c date is: 2 days              Patient currently is not medically stable to d/c.  Hospital stay for IV antibiotic treatment, he is failed outpatient oral antibiotic regimen.  His cellulitis is quite extensive therefore requires IV medications     Subjective: Patient is working with occupational therapy, having difficulty transferring from bed to chair but overall his pain has improved.  Review of Systems Otherwise negative except as per HPI, including: General: Denies fever, chills, night sweats or unintended weight loss. Resp: Denies cough, wheezing, shortness of breath. Cardiac: Denies chest pain, palpitations, orthopnea, paroxysmal nocturnal dyspnea. GI: Denies abdominal pain, nausea, vomiting, diarrhea or constipation GU: Denies dysuria, frequency, hesitancy or incontinence MS: Denies muscle aches, joint pain or swelling Neuro: Denies headache, neurologic deficits (focal weakness, numbness, tingling), abnormal gait Psych: Denies anxiety, depression, SI/HI/AVH Skin: Denies new rashes or lesions ID: Denies sick contacts, exotic exposures, travel  Examination:  Constitutional: Not in acute distress, morbidly obese. Respiratory: Clear to auscultation bilaterally Cardiovascular: Normal sinus rhythm, no rubs Abdomen: Nontender nondistended good bowel sounds Musculoskeletal: No edema noted Skin: Bilateral lower extremity chronic skin changes noted, left lower extremity dressing noted. Neurologic: CN 2-12 grossly intact.  And nonfocal Psychiatric: Normal judgment and insight. Alert and oriented x 3. Normal  mood.  Objective: Vitals:   09/08/19 0039 09/08/19 0248 09/08/19 0522 09/08/19 0525    BP:  (!) 130/57 103/83   Pulse: 63 69 64   Resp: 17 18 18    Temp: 98.2 F (36.8 C) 99.3 F (37.4 C) 99.9 F (37.7 C)   TempSrc:  Oral Oral   SpO2: 96% 94% (!) 89% 92%  Weight:      Height:        Intake/Output Summary (Last 24 hours) at 09/08/2019 1107 Last data filed at 09/08/2019 1100 Gross per 24 hour  Intake 1178.75 ml  Output --  Net 1178.75 ml   Filed Weights   09/07/19 2010  Weight: (!) 168.7 kg     Data Reviewed:   CBC: Recent Labs  Lab 09/07/19 1221 09/08/19 0344  WBC 19.4* 16.5*  NEUTROABS 15.5*  --   HGB 10.1* 8.5*  HCT 33.9* 27.5*  MCV 91.6 90.2  PLT 506* 884*   Basic Metabolic Panel: Recent Labs  Lab 09/04/19 1053 09/07/19 1221 09/08/19 0344  NA 138 137 133*  K 6.1* 4.8 4.6  CL 103 102 102  CO2 20 24 20*  GLUCOSE 158* 195* 124*  BUN 32* 42* 47*  CREATININE 1.63* 2.48* 2.37*  CALCIUM 8.3* 8.3* 8.0*  MG  --   --  2.4   GFR: Estimated Creatinine Clearance: 56.8 mL/min (A) (by C-G formula based on SCr of 2.37 mg/dL (H)). Liver Function Tests: Recent Labs  Lab 09/07/19 1221 09/08/19 0344  AST 24 21  ALT 31 23  ALKPHOS 84 64  BILITOT 0.9 0.8  PROT 7.6 6.2*  ALBUMIN 2.3* 1.9*   No results for input(s): LIPASE, AMYLASE in the last 168 hours. No results for input(s): AMMONIA in the last 168 hours. Coagulation Profile: No results for input(s): INR, PROTIME in the last 168 hours. Cardiac Enzymes: Recent Labs  Lab 09/07/19 1221  CKTOTAL 322   BNP (last 3 results) No results for input(s): PROBNP in the last 8760 hours. HbA1C: Recent Labs    09/07/19 1221  HGBA1C 8.0*   CBG: Recent Labs  Lab 09/07/19 1718 09/07/19 2056 09/08/19 0757  GLUCAP 151* 182* 108*   Lipid Profile: No results for input(s): CHOL, HDL, LDLCALC, TRIG, CHOLHDL, LDLDIRECT in the last 72 hours. Thyroid Function Tests: No results for input(s): TSH, T4TOTAL, FREET4, T3FREE, THYROIDAB in the last 72 hours. Anemia Panel: No results for input(s):  VITAMINB12, FOLATE, FERRITIN, TIBC, IRON, RETICCTPCT in the last 72 hours. Sepsis Labs: Recent Labs  Lab 09/07/19 1221  LATICACIDVEN 0.8    Recent Results (from the past 240 hour(s))  Culture, blood (Routine X 2) w Reflex to ID Panel     Status: None (Preliminary result)   Collection Time: 09/07/19 12:25 PM   Specimen: BLOOD  Result Value Ref Range Status   Specimen Description   Final    BLOOD RIGHT ANTECUBITAL Performed at [facility] [address], [place], [place] [address]    Special Requests   Final    BOTTLES DRAWN AEROBIC AND ANAEROBIC Blood Culture adequate volume Performed at [facility] [address] [place], [place] [address]    Culture   Final    NO GROWTH < 12 HOURS Performed at [facility] [address], [place], [place] [address]    Report Status PENDING  Incomplete  SARS Coronavirus 2 by RT PCR (hospital order, performed in [facility] hospital lab) Nasopharyngeal Nasopharyngeal Swab  Status: None   Collection Time: 09/07/19  2:37 PM   Specimen: Nasopharyngeal Swab  Result Value Ref Range Status   SARS Coronavirus 2 NEGATIVE NEGATIVE Final    Comment: (NOTE) SARS-CoV-2 target nucleic acids are NOT DETECTED.  The SARS-CoV-2 RNA is generally detectable in upper and lower respiratory specimens during the acute phase of infection. The lowest concentration of SARS-CoV-2 viral copies this assay can detect is 250 copies / mL. A negative result does not preclude SARS-CoV-2 infection and should not be used as the sole basis for treatment or other patient management decisions.  A negative result may occur with improper specimen collection / handling, submission of specimen other than nasopharyngeal swab, presence of viral mutation(s) within the areas targeted by this assay, and inadequate number of viral copies (<250 copies / mL). A negative result must be combined with clinical observations, patient history,  and epidemiological information.  Fact Sheet for Patients:   StrictlyIdeas.no  Fact Sheet for Healthcare Providers: BankingDealers.co.za  This test is not yet approved or  cleared by the [place] FDA and has been authorized for detection and/or diagnosis of SARS-CoV-2 by FDA under an Emergency Use Authorization (EUA).  This EUA will remain in effect (meaning this test can be used) for the duration of the COVID-19 declaration under Section 564(b)(1) of the Act, 21 U.S.C. section 360bbb-3(b)(1), unless the authorization is terminated or revoked sooner.  Performed at [facility], [place] [address] [place], [place] [address]   Culture, blood (Routine X 2) w Reflex to ID Panel     Status: None (Preliminary result)   Collection Time: 09/07/19  3:09 PM   Specimen: BLOOD RIGHT HAND  Result Value Ref Range Status   Specimen Description   Final    BLOOD RIGHT HAND Performed at [facility] [address] [place], [place] [address]    Special Requests   Final    BOTTLES DRAWN AEROBIC ONLY Blood Culture results may not be optimal due to an inadequate volume of blood received in culture bottles Performed at [facility] [address], [place], [place] [address]    Culture   Final    NO GROWTH < 12 HOURS Performed at [facility] [address], [place], [place] [address]    Report Status PENDING  Incomplete         Radiology Studies: DG Chest 2 View  Result Date: 09/07/2019 CLINICAL DATA:  Left shoulder pain. EXAM: CHEST - 2 VIEW COMPARISON:  PA and lateral chest 10/24/2017. FINDINGS: There is cardiomegaly and mild interstitial edema. No consolidative process, pneumothorax or effusion. No acute or focal bony abnormality. IMPRESSION: Cardiomegaly and interstitial edema. Electronically Signed   By: Inge Rise M.D.   On: 09/07/2019 14:40   DG Shoulder  Left  Result Date: 09/07/2019 CLINICAL DATA:  Left shoulder pain EXAM: LEFT SHOULDER - 2+ VIEW COMPARISON:  03/01/2018 FINDINGS: There is no evidence of fracture or dislocation. Mild-to-moderate arthropathy of the [facility] joint. Glenohumeral joint is within normal limits. Soft tissues are unremarkable. Vascular congestion and interstitial opacities within the visualized left lung. IMPRESSION: 1. No acute osseous abnormality. Mild-to-moderate arthropathy of the left AC joint. 2. Vascular congestion and interstitial opacities within the visualized left lung. Suggest dedicated PA and lateral chest radiographs. Electronically Signed   By: Davina Poke D.O.   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 No acute fracture identified. Pes planus. Moderate arthropathy at the tarsometatarsal joints with prominent dorsal hypertrophy. Chronic deformity of the second digit proximal phalanx. No cortical destruction or periostitis is seen. There is diffuse soft tissue prominence. No soft tissue gas. IMPRESSION: 1. No acute osseous abnormality. No radiographic evidence of acute osteomyelitis. 2. Diffuse soft tissue prominence. 3. Moderate arthropathy at the tarsometatarsal joints. Electronically Signed   By: Davina Poke D.O.   On: 09/07/2019 13:38   DG Foot Complete Right  Result Date: 09/07/2019 CLINICAL DATA:  Chronic right foot pain EXAM: RIGHT FOOT COMPLETE - 3+ VIEW COMPARISON:  08/07/2019 FINDINGS: Chronic findings of Charcot arthropathy of the right midfoot with fragmentation and remodeling of the osseous structures. No appreciable interval progression from prior radiograph. No new or acute fracture identified. There is a appears to be a soft tissue wound or defect the lateral aspect of the foot near the fifth metatarsal base.  Progressive destructive changes at the fourth and fifth metatarsal bases could reflect changes related to progressive Charcot arthropathy versus osteomyelitis. IMPRESSION: 1. Findings of Charcot arthropathy of the right midfoot with progressive destructive changes at the fourth and fifth metatarsal bases could reflect changes related to progressive Charcot arthropathy versus osteomyelitis. Correlation with serum inflammatory markers is suggested. An MRI could also be considered, although sensitivity for detection of osteomyelitis is decreased in the setting of concurrent neuropathic arthropathy. 2. Soft tissue wound or defect the lateral aspect of the foot near the fifth metatarsal base. Electronically Signed   By: Davina Poke D.O.   On: 09/07/2019 13:43        Scheduled Meds: . amiodarone  200 mg Oral BID  . apixaban  5 mg Oral BID  . atorvastatin  20 mg Oral Daily  . gabapentin  300 mg Oral BID  . insulin aspart  0-15 Units Subcutaneous TID WC  . insulin aspart  0-5 Units Subcutaneous QHS  . insulin glargine  50 Units Subcutaneous BID  . metoprolol tartrate  50 mg Oral BID   Continuous Infusions: . sodium chloride 75 mL/hr at 09/07/19 1535  . vancomycin       LOS: 1 day   Time spent= 35 mins    Tennelle Taflinger Arsenio Loader, MD [facility]  If 7PM-7AM, please contact night-coverage  09/08/2019, 11:07 AM

## 2019-09-08 NOTE — Evaluation (Signed)
Physical Therapy Evaluation Patient Details Name: Jeffery [name] MRN: [id number] DOB: 21-Mar-1961 Today's Date: 09/08/2019   History of Present Illness  59 y.o. male with medical history significant of Obesity, DM2, HTN, P Afib, EtOH use, Charcot's foot, Chronic Pain syndrome, Hx of Cardioversion, diastolic CHF, chronic LE blister and edema s/p excision by podiatry comes with worsening of left lower ext foot pain.   Clinical Impression  Jeffery [name] is 59 y.o. male admitted with above HPI and diagnosis. Patient is currently limited by functional impairments below (see PT problem list). Patient lives with his mother and is independent with mobility and ADL's at baseline. Patient is greatly limited by pain and requires mod +2 assist for functional transfers with RW. He was unable to progress to stand step transfer or gait training today due to pain. Patient will benefit from continued skilled PT interventions to address impairments and progress independence with mobility, recommending SNF. Acute PT will follow and progress as able.     Follow Up Recommendations SNF;Supervision/Assistance - 24 hour    Equipment Recommendations  Rolling walker with 5" wheels;3in1 (PT) (wide RW)    Recommendations for Other Services       Precautions / Restrictions Precautions Precautions: Fall Restrictions Weight Bearing Restrictions: No RLE Weight Bearing: Weight bearing as tolerated LLE Weight Bearing: Weight bearing as tolerated      Mobility  Bed Mobility Overal bed mobility: Needs Assistance Bed Mobility: Supine to Sit;Sit to Supine     Supine to sit: Mod assist;HOB elevated Sit to supine: HOB elevated;+2 for physical assistance;Max assist   General bed mobility comments: Mod assist with cues for walking LE's to EOB and reaching Rt UE to bed rail. Pt taking extra time and required assist for bil LE mobility to EOB. to return to supine pt was able to scoot laterally to [facility] 2x and  reach Rt UE to rail to control lowering trunk. Max assist required to raise LE's into bed. 2+ for safety.  Transfers Overall transfer level: Needs assistance Equipment used: Rolling walker (2 wheeled) Transfers: Sit to/from Stand Sit to Stand: Mod assist;+2 physical assistance;+2 safety/equipment;From elevated surface         General transfer comment: pt completed sit<>stand 1x from EOB. significant effort from pt from elevated bed height. Mod assist +2 to initiate power up and steady with rising to RW. Pt limited by pain in Lt shoulder and Lt LE. attempted small side steps at EOB but pt limited by pain and unable to move to recliner.   Ambulation/Gait                Stairs            Wheelchair Mobility    Modified Rankin (Stroke Patients Only)       Balance Overall balance assessment: Needs assistance Sitting-balance support: Feet supported Sitting balance-Leahy Scale: Fair     Standing balance support: Bilateral upper extremity supported Standing balance-Leahy Scale: Poor Standing balance comment: unable                    Pertinent Vitals/Pain Pain Assessment: Faces Pain Score: 10-Worst pain ever Faces Pain Scale: Hurts whole lot Pain Location: Lt shoulder, knee and foot Pain Descriptors / Indicators: Aching;Grimacing;Guarding;Moaning Pain Intervention(s): Limited activity within patient's tolerance;Monitored during session;Repositioned    Home Living Family/patient expects to be discharged to:: Private residence Living Arrangements: Parent Available Help at Discharge: Family;Friend(s) Type of Home: House Home Access: Stairs to enter Entrance Stairs-Rails:  Left Entrance Stairs-Number of Steps: 3+1 or 1+1 in through front Home Layout: Multi-level Home Equipment: Walker - 4 wheels;Cane - quad Additional Comments: lives with mom who is 79 and independent    Prior Function Level of Independence: Independent         Comments: patient  reports living in basement, typically urinates in a jug and goes upstairs when needs to have bowel movement     Hand Dominance   Dominant Hand: Right    Extremity/Trunk Assessment   Upper Extremity Assessment Upper Extremity Assessment: Defer to OT evaluation    Lower Extremity Assessment Lower Extremity Assessment: Overall WFL for tasks assessed    Cervical / Trunk Assessment Cervical / Trunk Assessment: Other exceptions Cervical / Trunk Exceptions: large habitus  Communication   Communication: No difficulties  Cognition Arousal/Alertness: Awake/alert Behavior During Therapy: WFL for tasks assessed/performed Overall Cognitive Status: Within Functional Limits for tasks assessed             General Comments General comments (skin integrity, edema, etc.): bil LE's with kerlix and ace wrap from proximla to toes up to below the knee.     Exercises General Exercises - Lower Extremity Ankle Circles/Pumps: AROM;Both;10 reps;Supine   Assessment/Plan    PT Assessment Patient needs continued PT services  PT Problem List Decreased strength;Decreased range of motion;Decreased activity tolerance;Decreased balance;Decreased mobility;Decreased knowledge of use of DME;Obesity;Pain       PT Treatment Interventions DME instruction;Gait training;Stair training;Functional mobility training;Therapeutic activities;Therapeutic exercise;Balance training;Patient/family education    PT Goals (Current goals can be found in the Care Plan section)  Acute Rehab PT Goals Patient Stated Goal: less pain PT Goal Formulation: With patient Time For Goal Achievement: 09/22/19 Potential to Achieve Goals: Good    Frequency Min 3X/week   Barriers to discharge   pt lives with 72 y.o. mother and has stairs to enter home    Co-evaluation               AM-PAC PT "6 Clicks" Mobility  Outcome Measure Help needed turning from your back to your side while in a flat bed without using bedrails?: A  Lot Help needed moving from lying on your back to sitting on the side of a flat bed without using bedrails?: A Lot Help needed moving to and from a bed to a chair (including a wheelchair)?: A Lot Help needed standing up from a chair using your arms (e.g., wheelchair or bedside chair)?: A Lot Help needed to walk in hospital room?: Total Help needed climbing 3-5 steps with a railing? : Total 6 Click Score: 10    End of Session Equipment Utilized During Treatment: Gait belt Activity Tolerance: Patient tolerated treatment well Patient left: in bed;with call bell/phone within reach;with bed alarm set;with family/visitor present Nurse Communication: Mobility status PT Visit Diagnosis: Muscle weakness (generalized) (M62.81);Difficulty in walking, not elsewhere classified (R26.2);Pain Pain - Right/Left: Left Pain - part of body: Shoulder;Knee;Ankle and joints of foot;Leg    Time: 3149-7026 PT Time Calculation (min) (ACUTE ONLY): 39 min   Charges:   PT Evaluation $PT Eval Moderate Complexity: 1 Mod PT Treatments $Therapeutic Activity: 23-37 mins       Verner Mould, DPT [place]  Office [phone] Pager [phone]  09/08/2019 1:01 PM

## 2019-09-08 NOTE — Evaluation (Signed)
Occupational Therapy Evaluation Patient Details Name: Jeffery [name] MRN: [id number] DOB: 11-07-1960 Today's Date: 09/08/2019    History of Present Illness 59 y.o. male with medical history significant of Obesity, DM2, HTN, P Afib, EtOH use, Charcot's foot, Chronic Pain syndrome, Hx of Cardioversion, diastolic CHF, chronic LE blister and edema s/p excision by podiatry comes with worsening of left lower ext foot pain   Clinical Impression   Patient with functional deficits listed below impacting safety and independence with self care. Patient require mod A for supine <> sit with mod cues for problem solving. Set up recliner to attempt lateral scooting however patient unable to clear buttock enough. Also attempted use of walker however patient having too much pain through L LE/UE. Patient was able to assist scooting towards head of bed. Currently recommend rehab prior to D/C home as patient has steps within home and cannot safely take care of himself. Will continue to follow.    Follow Up Recommendations  SNF    Equipment Recommendations  Other (comment) (TBD)       Precautions / Restrictions Precautions Precautions: Fall Restrictions Weight Bearing Restrictions: No RLE Weight Bearing: Weight bearing as tolerated LLE Weight Bearing: Weight bearing as tolerated      Mobility Bed Mobility Overal bed mobility: Needs Assistance Bed Mobility: Supine to Sit;Sit to Supine     Supine to sit: Mod assist;HOB elevated Sit to supine: Mod assist   General bed mobility comments: modA to mobilize L LE to EOB and min A for trunk support, mod A to lift LEs back into bed   Transfers                 General transfer comment: unable, set up to try lateral scoot to recliner however patient unable     Balance Overall balance assessment: Needs assistance Sitting-balance support: Single extremity supported;Feet supported Sitting balance-Leahy Scale: Fair         Standing balance  comment: unable                           ADL either performed or assessed with clinical judgement   ADL Overall ADL's : Needs assistance/impaired Eating/Feeding: Set up;Sitting;Bed level   Grooming: Set up;Sitting;Standing   Upper Body Bathing: Minimal assistance;Sitting;Bed level   Lower Body Bathing: Maximal assistance   Upper Body Dressing : Minimal assistance;Sitting;Bed level   Lower Body Dressing: Maximal assistance;Total assistance Lower Body Dressing Details (indicate cue type and reason): to don socks Toilet Transfer: +2 for physical assistance;+2 for safety/equipment Toilet Transfer Details (indicate cue type and reason): anticipate x2 assist, unable to attempt lateral scoot to recliner Toileting- Clothing Manipulation and Hygiene: Total assistance         General ADL Comments: patient requiring increased assistance with self care due to pain, decreased strength, activity tolerance                  Pertinent Vitals/Pain Pain Assessment: Faces Pain Score: 10-Worst pain ever Faces Pain Scale: Hurts whole lot Pain Location: Lt shoulder, knee and foot Pain Descriptors / Indicators: Aching;Grimacing;Guarding;Moaning Pain Intervention(s): Limited activity within patient's tolerance;Monitored during session;Repositioned     Hand Dominance Right   Extremity/Trunk Assessment Upper Extremity Assessment Upper Extremity Assessment: Defer to OT evaluation   Lower Extremity Assessment Lower Extremity Assessment: Overall WFL for tasks assessed   Cervical / Trunk Assessment Cervical / Trunk Assessment: Other exceptions   Communication Communication Communication: No difficulties  Cognition Arousal/Alertness: Awake/alert Behavior During Therapy: WFL for tasks assessed/performed Overall Cognitive Status: Within Functional Limits for tasks assessed                                                Home Living Family/patient expects  to be discharged to:: Private residence Living Arrangements: Parent Available Help at Discharge: Family;Friend(s) Type of Home: House Home Access: Stairs to enter [employer] of Steps: 3+1 or 1+1 in through front Entrance Stairs-Rails: Left Home Layout: Multi-level Alternate Level Stairs-Number of Steps: 10   Bathroom Shower/Tub: Teacher, early years/pre: Standard Bathroom Accessibility: Yes How Accessible: Accessible via walker Home Equipment: [place] - 4 wheels;Cane - quad   Additional Comments: lives with mom who is 81 and independent      Prior Functioning/Environment Level of Independence: Independent        Comments: patient reports living in basement, typically urinates in a jug and goes upstairs when needs to have bowel movement        OT Problem List: Decreased strength;Decreased activity tolerance;Impaired balance (sitting and/or standing);Decreased safety awareness;Decreased knowledge of use of DME or AE;Obesity;Pain;Impaired UE functional use      OT Treatment/Interventions: Self-care/ADL training;Therapeutic exercise;Energy conservation;DME and/or AE instruction;Therapeutic activities;Patient/family education;Balance training    OT Goals(Current goals can be found in the care plan section) Acute Rehab OT Goals Patient Stated Goal: less pain OT Goal Formulation: With patient Time For Goal Achievement: 09/22/19 Potential to Achieve Goals: Good  OT Frequency: Min 2X/week   Barriers to D/C: Inaccessible home environment  lives in multi level home          AM-PAC OT "6 Clicks" Daily Activity     Outcome Measure Help from another person eating meals?: A Little Help from another person taking care of personal grooming?: A Little Help from another person toileting, which includes using toliet, bedpan, or urinal?: Total Help from another person bathing (including washing, rinsing, drying)?: A Lot Help from another person to put on and  taking off regular upper body clothing?: A Little Help from another person to put on and taking off regular lower body clothing?: Total 6 Click Score: 13   End of Session Nurse Communication: Mobility status  Activity Tolerance: Patient limited by pain Patient left: in bed;with call bell/phone within reach;with bed alarm set  OT Visit Diagnosis: Other abnormalities of gait and mobility (R26.89);Muscle weakness (generalized) (M62.81);Pain Pain - Right/Left: Left Pain - part of body: Shoulder;Leg;Ankle and joints of foot                Time: 6433-2951 OT Time Calculation (min): 40 min Charges:  OT General Charges $OT Visit: 1 Visit OT Evaluation $OT Eval Moderate Complexity: 1 Mod OT Treatments $Self Care/Home Management : 23-37 mins  Delbert Phenix OT Pager: [phone]  Rosemary Holms 09/08/2019, 12:54 PM

## 2019-09-08 NOTE — Telephone Encounter (Signed)
I went by the hospital to see him yesterday.

## 2019-09-08 NOTE — Telephone Encounter (Signed)
CSW received referral to assist patient who shared that he has a $700 cost for an ambulance ride to the ED. CSW returned call and left message for patient that he has medicaid and should contact ambulance company and provide medicaid information for billing. CSW available should patient return call. Raquel Sarna, [place], [place]

## 2019-09-08 NOTE — Plan of Care (Signed)
  Problem: Clinical Measurements: Goal: Will remain free from infection Outcome: Progressing   Problem: Clinical Measurements: Goal: Diagnostic test results will improve Outcome: Progressing   Problem: Clinical Measurements: Goal: Cardiovascular complication will be avoided Outcome: Progressing   Problem: Clinical Measurements: Goal: Respiratory complications will improve Outcome: Progressing

## 2019-09-09 ENCOUNTER — Inpatient Hospital Stay (HOSPITAL_COMMUNITY): Payer: Medicaid Other

## 2019-09-09 DIAGNOSIS — L02619 Cutaneous abscess of unspecified foot: Secondary | ICD-10-CM

## 2019-09-09 LAB — COMPREHENSIVE METABOLIC PANEL
ALT: 40 U/L (ref 0–44)
AST: 52 U/L — ABNORMAL HIGH (ref 15–41)
Albumin: 2.1 g/dL — ABNORMAL LOW (ref 3.5–5.0)
Alkaline Phosphatase: 78 U/L (ref 38–126)
Anion gap: 12 (ref 5–15)
BUN: 67 mg/dL — ABNORMAL HIGH (ref 6–20)
CO2: 19 mmol/L — ABNORMAL LOW (ref 22–32)
Calcium: 8.5 mg/dL — ABNORMAL LOW (ref 8.9–10.3)
Chloride: 102 mmol/L (ref 98–111)
Creatinine, Ser: 3.74 mg/dL — ABNORMAL HIGH (ref 0.61–1.24)
GFR calc Af Amer: 20 mL/min — ABNORMAL LOW (ref >60)
GFR calc non Af Amer: 17 mL/min — ABNORMAL LOW (ref >60)
Glucose, Bld: 96 mg/dL (ref 70–99)
Potassium: 4.8 mmol/L (ref 3.5–5.1)
Sodium: 133 mmol/L — ABNORMAL LOW (ref 135–145)
Total Bilirubin: 0.8 mg/dL (ref 0.3–1.2)
Total Protein: 7.2 g/dL (ref 6.5–8.1)

## 2019-09-09 LAB — GLUCOSE, CAPILLARY
Glucose-Capillary: 88 mg/dL (ref 70–99)
Glucose-Capillary: 85 mg/dL (ref 70–99)
Glucose-Capillary: 100 mg/dL — ABNORMAL HIGH (ref 70–99)
Glucose-Capillary: 156 mg/dL — ABNORMAL HIGH (ref 70–99)

## 2019-09-09 LAB — CBC
HCT: 33.3 % — ABNORMAL LOW (ref 39.0–52.0)
Hemoglobin: 9.7 g/dL — ABNORMAL LOW (ref 13.0–17.0)
MCH: 26.9 pg (ref 26.0–34.0)
MCHC: 29.1 g/dL — ABNORMAL LOW (ref 30.0–36.0)
MCV: 92.5 fL (ref 80.0–100.0)
Platelets: 513 10*3/uL — ABNORMAL HIGH (ref 150–400)
RBC: 3.6 MIL/uL — ABNORMAL LOW (ref 4.22–5.81)
RDW: 15.9 % — ABNORMAL HIGH (ref 11.5–15.5)
WBC: 18.5 10*3/uL — ABNORMAL HIGH (ref 4.0–10.5)
nRBC: 0 % (ref 0.0–0.2)

## 2019-09-09 LAB — MAGNESIUM: Magnesium: 2.6 mg/dL — ABNORMAL HIGH (ref 1.7–2.4)

## 2019-09-09 LAB — BRAIN NATRIURETIC PEPTIDE: B Natriuretic Peptide: 159.5 pg/mL — ABNORMAL HIGH (ref 0.0–100.0)

## 2019-09-09 MED ORDER — VANCOMYCIN HCL 2000 MG/400ML IV SOLN
2000.0000 mg | INTRAVENOUS | Status: DC
  Administered 2019-09-10: 2000 mg via INTRAVENOUS
  Filled 2019-09-09 (×2): qty 400

## 2019-09-09 NOTE — Progress Notes (Signed)
Pharmacy Antibiotic Note  Jeffery [name] is a 59 y.o. male admitted on 09/07/2019 with cellulitis of left lower extremity. Pharmacy has been consulted for Vancomycin dosing.  Wound clinically improved per MD note. Scr continues to rise; output not charted  Plan:  Decrease Vancomycin 2gm IV q48h for declining renal function  Monitor renal function closely in setting of AKI. Consider alternate antibiotic if renal fxn continues to decline.   Monitor cultures, clinical course, and for ability to de-escalate therapy.   No other current medications require renal dose adjustments at this time.  Will continue to screen daily.   Height: 6' 2.02" (188 cm) Weight: (!) 167.1 kg (368 lb 4.8 oz) IBW/kg (Calculated) : 82.24  Temp (24hrs), Avg:99.1 F (37.3 C), Min:98.2 F (36.8 C), Max:100.3 F (37.9 C)  Recent Labs  Lab 09/04/19 1053 09/07/19 1221 09/08/19 0344 09/09/19 0620  WBC  --  19.4* 16.5* 18.5*  CREATININE 1.63* 2.48* 2.37* 3.74*  LATICACIDVEN  --  0.8  --   --     Estimated Creatinine Clearance: 35.8 mL/min (A) (by C-G formula based on SCr of 3.74 mg/dL (H)).    Allergies  Allergen Reactions  . Metformin And Related Other (See Comments)    GI upset    Antimicrobials this admission: 6/10 Zosyn x 1 6/10 Vancomycin >>  Dose adjustments this admission: --  Microbiology results: 6/10 BCx: NGTD 6/10 COVID: negative  Thank you for allowing pharmacy to be a part of this patient's care.  Netta Cedars, PharmD, BCPS 09/09/2019 8:10 AM

## 2019-09-09 NOTE — Progress Notes (Signed)
Have spoken with MD regarding pt BP 118/72 with HR remaining in 50's (right now 54). Holding Pacerone and Lopressor at this time. Pt alert and talkative. Telemetry on.

## 2019-09-09 NOTE — Progress Notes (Signed)
Subjective: 59 year old male admitted to the hospital for AKI, sepsis from left lower extremity cellulitis.  He is feeling better today and is in better spirits.  His mom is at bedside today.  Still in pain to left lower extremity but somewhat improved.  Currently denies any fevers or chills.  No , chest pain or shortness of breath.  Objective: AAO x3, NAD- sitting up in bed.  Multiple superficial partial-thickness wounds are present bilateral lower extremities and there is decreased edema bilaterally there is no erythema to the left lower extremity but there is no areas of fluctuation crepitation.  There is no malodor.  Full-thickness wound to the dorsal lateral aspect the right foot with ACell graft intact.  Some clear drainage but there is no purulence identified today.  There is tenderness on palpation today.  Ankle and foot range of motion intact.  Assessment: Cellulitis, sepsis- clinically improving  Plan: Currently afebrile but white blood cell count elevated.  Continue IV antibiotics for now.  The dressing was changed today.  The legs were cleaned and applied Xeroform, ABD pads, Kerlix, and Ace bandages.  Encouraged elevation.  Will monitor closely for clinical improvement.  Likely discharge to rehab next week.   Celesta Gentile, DPM O: [phone] C: [phone]

## 2019-09-09 NOTE — Progress Notes (Addendum)
Subjective: 59 year old male admitted to the hospital for AKI, sepsis from left lower extremity cellulitis. He just had a CT of the tib-fib/femur. He is feeling better today and is in better spirits. WBC somewhat improved today. Arterial studies cancelled. Currently denies any fevers, chills. No chest pain, SOB.    Objective: AAO x3, NAD- sitting up in bed. Nursing just changed the right lower extremity dressing. I was able to see the left. There is still edema and erythema with clear drainage weeping from the leg but appears to be improved. No pain. Able to move the ankles bilateral without any pain. Multiple partial thickness ulcers on the left lower extremity.  No pain with calf compression  Assessment: Cellulitis, sepsis- clinically improving  Plan: The dressing was changed on the right but from what I can see the swelling is improved. On the left side multiple partial thickness ulcers. Swelling and erythema improved. WBC down today but still low grade fever. Continue IV antibiotics. Blood cultures NGTD. Await MRI reads. Continue local wound care. I will plan on changing the bandages tomorrow afternoon. Podiatry will continue to follow  Celesta Gentile, DPM O: [phone] C: [phone]

## 2019-09-09 NOTE — Progress Notes (Signed)
PROGRESS NOTE    Jeffery [name]  NTI:[id number] DOB: March 10, 1961 DOA: 09/07/2019 PCP: [name], MD   Brief Narrative:  59 y.o. male with medical history significant of Obesity, DM2, HTN, P Afib, EtOH use, Charcot's foot, Chronic Pain syndrome, Hx of Cardioversion, diastolic CHF, chronic LE blister and edema s/p excision by podiatry comes with worsening of left lower ext foot pain.  Admitted for sepsis secondary to purulent left lower extremity cellulitis.  Hospital course complicated by AKI   Assessment & Plan:   Principal Problem:   Cellulitis of left lower extremity Active Problems:   Charcot foot due to diabetes mellitus ([place])   Essential hypertension   Uncontrolled type 2 diabetes mellitus with diabetic polyneuropathy, without long-term current use of insulin (HCC)   Paroxysmal atrial fibrillation (H[facility])   Chronic anticoagulation   AKI (acute kidney injury) ([place])   Left leg cellulitis   Sepsis ([place])  Sepsis secondary to left Lower extremity Cellulitis with some purulent; POA B/L lower extremity chronic skin changes with blister -Sepsis physiology improving. -UA-possible mild UTI?,  Blood cultures negative -IV vancomycin-day 3.  Cellulitis order set used -Pain control, bowel regimen -Ultrasound ABI-difficult to obtain due to blisters -MRI-left lower extremity cellulitis but no evidence of abscess/osteomyelitis -Routine wound care  Left shoulder and left ankle pain, improved -X-rays negative for acute pathology shows multiple chronic changes. -Doing better today therefore we will hold off on MRI -Pain control -Continue work with PT/OT-SNF  Acute Kidney Injury on CKD Stage IIIa, worsening -Baseline creatinine 1.6, admission creatinine 2.4.  Today's 3.74.  I am concerned about urinary retention therefore will obtain renal ultrasound, periodic bladder scan.  If retaining greater than 250 cc with postvoid residual, advised nursing staff to place Foley. Pharmacy  consulted to adjust home meds according to renal function  Essential Hypertension  -Metoprolol.  DM2 with neuropathy , in decent range 107-144 -Insulin sliding scale and Accu-Chek.  Lantus 50 units twice daily -Gabapentin -Diabetic diet  Paroxysmal A Fib, s/p Cardioversion -On metoprolol, amiodarone and Eliquis     DVT prophylaxis: Eliquis Code Status: Full code Family Communication: Spoke with his mother yesterday  Status is: Inpatient  Remains inpatient appropriate because:IV treatments appropriate due to intensity of illness or inability to take PO   Dispo: The patient is from: Home              Anticipated d/c is to: SNF              Anticipated d/c date is: 2 days              Patient currently is not medically stable to d/c.  Maintain hospital stay due to worsening renal function requiring further work-up.  In the meantime requires IV antibiotics   Subjective: In terms of his lower extremity pain he feels much better, tells me he had dark urine this morning.  Still having lower extremity pain with movement but no redness close to what it used to be  Review of Systems Otherwise negative except as per HPI, including: General: Denies fever, chills, night sweats or unintended weight loss. Resp: Denies cough, wheezing, shortness of breath. Cardiac: Denies chest pain, palpitations, orthopnea, paroxysmal nocturnal dyspnea. GI: Denies abdominal pain, nausea, vomiting, diarrhea or constipation GU: Denies dysuria, frequency, hesitancy or incontinence MS: Denies muscle aches, joint pain or swelling Neuro: Denies headache, neurologic deficits (focal weakness, numbness, tingling), abnormal gait Psych: Denies anxiety, depression, SI/HI/AVH Skin: Denies new rashes or lesions ID: Denies sick  contacts, exotic exposures, travel  Examination: Constitutional: Not in acute distress, morbidly obese Respiratory: Clear to auscultation bilaterally Cardiovascular: Normal sinus  rhythm, no rubs Abdomen: Nontender nondistended good bowel sounds Musculoskeletal: No edema noted Skin: Bilateral lower extremity skin erythema, bilateral dressings in place.  Left extremity is slightly warm to touch Neurologic: CN 2-12 grossly intact.  And nonfocal Psychiatric: Normal judgment and insight. Alert and oriented x 3. Normal mood.   Objective: Vitals:   09/08/19 0525 09/08/19 1445 09/08/19 2107 09/09/19 0500  BP:  (!) 102/54 117/67 118/72  Pulse:  (!) 55 (!) 51 (!) 50  Resp:  15 20   Temp:  100.3 F (37.9 C) 98.9 F (37.2 C) 98.2 F (36.8 C)  TempSrc:  Axillary Oral   SpO2: 92% 90% 93% 98%  Weight:    (!) 167.1 kg  Height:        Intake/Output Summary (Last 24 hours) at 09/09/2019 0744 Last data filed at 09/08/2019 1700 Gross per 24 hour  Intake 1062 ml  Output --  Net 1062 ml   Filed Weights   09/07/19 2010 09/09/19 0500  Weight: (!) 168.7 kg (!) 167.1 kg     Data Reviewed:   CBC: Recent Labs  Lab 09/07/19 1221 09/08/19 0344 09/09/19 0620  WBC 19.4* 16.5* 18.5*  NEUTROABS 15.5*  --   --   HGB 10.1* 8.5* 9.7*  HCT 33.9* 27.5* 33.3*  MCV 91.6 90.2 92.5  PLT 506* 406* 034*   Basic Metabolic Panel: Recent Labs  Lab 09/04/19 1053 09/07/19 1221 09/08/19 0344 09/09/19 0620  NA 138 137 133* 133*  K 6.1* 4.8 4.6 4.8  CL 103 102 102 102  CO2 20 24 20* 19*  GLUCOSE 158* 195* 124* 96  BUN 32* 42* 47* 67*  CREATININE 1.63* 2.48* 2.37* 3.74*  CALCIUM 8.3* 8.3* 8.0* 8.5*  MG  --   --  2.4 2.6*   GFR: Estimated Creatinine Clearance: 35.8 mL/min (A) (by C-G formula based on SCr of 3.74 mg/dL (H)). Liver Function Tests: Recent Labs  Lab 09/07/19 1221 09/08/19 0344 09/09/19 0620  AST 24 21 52*  ALT 31 23 40  ALKPHOS 84 64 78  BILITOT 0.9 0.8 0.8  PROT 7.6 6.2* 7.2  ALBUMIN 2.3* 1.9* 2.1*   No results for input(s): LIPASE, AMYLASE in the last 168 hours. No results for input(s): AMMONIA in the last 168 hours. Coagulation Profile: No  results for input(s): INR, PROTIME in the last 168 hours. Cardiac Enzymes: Recent Labs  Lab 09/07/19 1221  CKTOTAL 322   BNP (last 3 results) No results for input(s): PROBNP in the last 8760 hours. HbA1C: Recent Labs    09/07/19 1221  HGBA1C 8.0*   CBG: Recent Labs  Lab 09/08/19 0757 09/08/19 1201 09/08/19 1450 09/08/19 1716 09/08/19 2109  GLUCAP 108* 107* 144* 126* 131*   Lipid Profile: No results for input(s): CHOL, HDL, LDLCALC, TRIG, CHOLHDL, LDLDIRECT in the last 72 hours. Thyroid Function Tests: No results for input(s): TSH, T4TOTAL, FREET4, T3FREE, THYROIDAB in the last 72 hours. Anemia Panel: No results for input(s): VITAMINB12, FOLATE, FERRITIN, TIBC, IRON, RETICCTPCT in the last 72 hours. Sepsis Labs: Recent Labs  Lab 09/07/19 1221  LATICACIDVEN 0.8    Recent Results (from the past 240 hour(s))  Culture, blood (Routine X 2) w Reflex to ID Panel     Status: None (Preliminary result)   Collection Time: 09/07/19 12:25 PM   Specimen: BLOOD  Result Value Ref Range Status  Specimen Description   Final    BLOOD RIGHT ANTECUBITAL Performed at [facility] [address] [place], [place] [address]    Special Requests   Final    BOTTLES DRAWN AEROBIC AND ANAEROBIC Blood Culture adequate volume Performed at [facility] [address] [place], [place] [address]    Culture   Final    NO GROWTH 2 DAYS Performed at [facility] [address], [place], [place] [address]    Report Status PENDING  Incomplete  SARS Coronavirus 2 by RT PCR (hospital order, performed in [facility] hospital lab) Nasopharyngeal Nasopharyngeal Swab     Status: None   Collection Time: 09/07/19  2:37 PM   Specimen: Nasopharyngeal Swab  Result Value Ref Range Status   SARS Coronavirus 2 NEGATIVE NEGATIVE Final    Comment: (NOTE) SARS-CoV-2 target nucleic acids are NOT DETECTED.  The SARS-CoV-2 RNA is generally detectable in upper and  lower respiratory specimens during the acute phase of infection. The lowest concentration of SARS-CoV-2 viral copies this assay can detect is 250 copies / mL. A negative result does not preclude SARS-CoV-2 infection and should not be used as the sole basis for treatment or other patient management decisions.  A negative result may occur with improper specimen collection / handling, submission of specimen other than nasopharyngeal swab, presence of viral mutation(s) within the areas targeted by this assay, and inadequate number of viral copies (<250 copies / mL). A negative result must be combined with clinical observations, patient history, and epidemiological information.  Fact Sheet for Patients:   StrictlyIdeas.no  Fact Sheet for Healthcare Providers: BankingDealers.co.za  This test is not yet approved or  cleared by the [place] FDA and has been authorized for detection and/or diagnosis of SARS-CoV-2 by FDA under an Emergency Use Authorization (EUA).  This EUA will remain in effect (meaning this test can be used) for the duration of the COVID-19 declaration under Section 564(b)(1) of the Act, 21 U.S.C. section 360bbb-3(b)(1), unless the authorization is terminated or revoked sooner.  Performed at [facility], [place] [address] [place], [place] [address]   Culture, blood (Routine X 2) w Reflex to ID Panel     Status: None (Preliminary result)   Collection Time: 09/07/19  3:09 PM   Specimen: BLOOD RIGHT HAND  Result Value Ref Range Status   Specimen Description   Final    BLOOD RIGHT HAND Performed at [facility] [address], [place], [place] [address]    Special Requests   Final    BOTTLES DRAWN AEROBIC ONLY Blood Culture results may not be optimal due to an inadequate volume of blood received in culture bottles Performed at [facility] [address]  [place],  [address]    Culture   Final    NO GROWTH 2 DAYS Performed at [facility] [address], [place],  [address]    Report Status PENDING  Incomplete         Radiology Studies: DG Chest 2 View  Result Date: 09/07/2019 CLINICAL DATA:  Left shoulder pain. EXAM: CHEST - 2 VIEW COMPARISON:  PA and lateral chest 10/24/2017. FINDINGS: There is cardiomegaly and mild interstitial edema. No consolidative process, pneumothorax or effusion. No acute or focal bony abnormality. IMPRESSION: Cardiomegaly and interstitial edema. Electronically Signed   By: Inge Rise M.D.   On: 09/07/2019 14:40   MR TIBIA FIBULA LEFT WO CONTRAST  Result Date: 09/08/2019 CLINICAL DATA:  Left leg cellulitis. EXAM: MRI OF LOWER LEFT EXTREMITY WITHOUT CONTRAST TECHNIQUE: Multiplanar, multisequence MR imaging of the left lower leg was performed. No intravenous contrast was administered. COMPARISON:  None. FINDINGS: Bones/Joint/Cartilage No marrow signal abnormality. No fracture or dislocation. Muscles and Tendons Intact. No muscle edema. Severe atrophy of the distal flexor and extensor muscles. Soft tissue Moderate circumferential soft tissue swelling. No fluid collection or hematoma. No soft tissue mass. IMPRESSION: 1. Moderate circumferential soft tissue swelling of the lower leg, consistent with history of cellulitis. No abscess or osteomyelitis. Electronically Signed   By: Titus Dubin M.D.   On: 09/08/2019 17:57   MR FEMUR LEFT WO CONTRAST  Result Date: 09/08/2019 CLINICAL DATA:  Left leg cellulitis. EXAM: MR OF THE LEFT FEMUR WITHOUT CONTRAST TECHNIQUE: Multiplanar, multisequence MR imaging of the left femur was performed. No intravenous contrast was administered. COMPARISON:  None. FINDINGS: Bones/Joint/Cartilage No marrow signal abnormality. No fracture or dislocation. Moderate left knee effusion without synovitis. Muscles and Tendons Scattered mild patchy edema involving the adductor and  quadriceps muscles, nonspecific, but likely related to diabetic muscle changes. No significant muscle atrophy. Soft tissue Moderate soft tissue swelling of the medial and lateral thigh. No fluid collection or hematoma. No soft tissue mass. IMPRESSION: 1. Moderate soft tissue swelling of the medial and lateral aspects of the left thigh, consistent with cellulitis. No abscess or osteomyelitis. 2. Moderate left knee effusion without synovitis, likely reactive. Electronically Signed   By: Titus Dubin M.D.   On: 09/08/2019 17:53   DG Shoulder Left  Result Date: 09/07/2019 CLINICAL DATA:  Left shoulder pain EXAM: LEFT SHOULDER - 2+ VIEW COMPARISON:  03/01/2018 FINDINGS: There is no evidence of fracture or dislocation. Mild-to-moderate arthropathy of the [facility] joint. Glenohumeral joint is within normal limits. Soft tissues are unremarkable. Vascular congestion and interstitial opacities within the visualized left lung. IMPRESSION: 1. No acute osseous abnormality. Mild-to-moderate arthropathy of the left AC joint. 2. Vascular congestion and interstitial opacities within the visualized left lung. Suggest dedicated PA and lateral chest radiographs. Electronically Signed   By: Davina Poke D.O.   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 No acute fracture identified. Pes planus. Moderate arthropathy at the tarsometatarsal joints with prominent dorsal hypertrophy. Chronic deformity of the second digit proximal phalanx. No cortical destruction or periostitis is seen. There is diffuse soft tissue prominence. No soft tissue gas. IMPRESSION: 1. No acute osseous abnormality. No radiographic evidence of acute osteomyelitis. 2. Diffuse soft tissue prominence. 3. Moderate arthropathy at the tarsometatarsal joints. Electronically Signed   By:  Davina Poke D.O.   On: 09/07/2019 13:38   DG Foot Complete Right  Result Date: 09/07/2019 CLINICAL DATA:  Chronic right foot pain EXAM: RIGHT FOOT COMPLETE - 3+ VIEW COMPARISON:  08/07/2019 FINDINGS: Chronic findings of Charcot arthropathy of the right midfoot with fragmentation and remodeling of the osseous structures. No appreciable interval progression from prior radiograph. No new or acute fracture identified. There is a appears to be a soft tissue wound or defect the lateral aspect of the foot near the fifth metatarsal base. Progressive destructive changes at the fourth and fifth metatarsal bases could reflect changes related to progressive Charcot arthropathy versus osteomyelitis. IMPRESSION: 1. Findings of Charcot arthropathy of the right midfoot with progressive destructive  changes at the fourth and fifth metatarsal bases could reflect changes related to progressive Charcot arthropathy versus osteomyelitis. Correlation with serum inflammatory markers is suggested. An MRI could also be considered, although sensitivity for detection of osteomyelitis is decreased in the setting of concurrent neuropathic arthropathy. 2. Soft tissue wound or defect the lateral aspect of the foot near the fifth metatarsal base. Electronically Signed   By: Davina Poke D.O.   On: 09/07/2019 13:43        Scheduled Meds: . amiodarone  200 mg Oral BID  . apixaban  5 mg Oral BID  . atorvastatin  20 mg Oral Daily  . gabapentin  300 mg Oral BID  . insulin aspart  0-15 Units Subcutaneous TID WC  . insulin aspart  0-5 Units Subcutaneous QHS  . insulin glargine  50 Units Subcutaneous BID  . metoprolol tartrate  50 mg Oral BID   Continuous Infusions: . vancomycin 1,750 mg (09/08/19 1330)     LOS: 2 days   Time spent= 35 mins    Caleesi Kohl Arsenio Loader, MD [facility]  If 7PM-7AM, please contact night-coverage  09/09/2019, 7:44 AM

## 2019-09-10 ENCOUNTER — Inpatient Hospital Stay (HOSPITAL_COMMUNITY): Payer: Medicaid Other

## 2019-09-10 DIAGNOSIS — L02619 Cutaneous abscess of unspecified foot: Secondary | ICD-10-CM

## 2019-09-10 LAB — GLUCOSE, CAPILLARY
Glucose-Capillary: 70 mg/dL (ref 70–99)
Glucose-Capillary: 72 mg/dL (ref 70–99)
Glucose-Capillary: 90 mg/dL (ref 70–99)
Glucose-Capillary: 101 mg/dL — ABNORMAL HIGH (ref 70–99)

## 2019-09-10 LAB — COMPREHENSIVE METABOLIC PANEL
ALT: 66 U/L — ABNORMAL HIGH (ref 0–44)
AST: 91 U/L — ABNORMAL HIGH (ref 15–41)
Albumin: 2 g/dL — ABNORMAL LOW (ref 3.5–5.0)
Alkaline Phosphatase: 77 U/L (ref 38–126)
Anion gap: 12 (ref 5–15)
BUN: 71 mg/dL — ABNORMAL HIGH (ref 6–20)
CO2: 20 mmol/L — ABNORMAL LOW (ref 22–32)
Calcium: 8.4 mg/dL — ABNORMAL LOW (ref 8.9–10.3)
Chloride: 103 mmol/L (ref 98–111)
Creatinine, Ser: 3.6 mg/dL — ABNORMAL HIGH (ref 0.61–1.24)
GFR calc Af Amer: 20 mL/min — ABNORMAL LOW (ref >60)
GFR calc non Af Amer: 18 mL/min — ABNORMAL LOW (ref >60)
Glucose, Bld: 84 mg/dL (ref 70–99)
Potassium: 4.9 mmol/L (ref 3.5–5.1)
Sodium: 135 mmol/L (ref 135–145)
Total Bilirubin: 0.6 mg/dL (ref 0.3–1.2)
Total Protein: 6.8 g/dL (ref 6.5–8.1)

## 2019-09-10 LAB — URINALYSIS, ROUTINE W REFLEX MICROSCOPIC
Bilirubin Urine: NEGATIVE
Glucose, UA: NEGATIVE mg/dL
Ketones, ur: NEGATIVE mg/dL
Leukocytes,Ua: NEGATIVE
Nitrite: NEGATIVE
Protein, ur: 30 mg/dL — AB
RBC / HPF: >50 RBC/hpf — ABNORMAL HIGH (ref 0–5)
Specific Gravity, Urine: 1.008 (ref 1.005–1.030)
pH: 5 (ref 5.0–8.0)

## 2019-09-10 LAB — MAGNESIUM: Magnesium: 2.6 mg/dL — ABNORMAL HIGH (ref 1.7–2.4)

## 2019-09-10 LAB — CBC
HCT: 30.3 % — ABNORMAL LOW (ref 39.0–52.0)
Hemoglobin: 9.2 g/dL — ABNORMAL LOW (ref 13.0–17.0)
MCH: 27.1 pg (ref 26.0–34.0)
MCHC: 30.4 g/dL (ref 30.0–36.0)
MCV: 89.4 fL (ref 80.0–100.0)
Platelets: 479 10*3/uL — ABNORMAL HIGH (ref 150–400)
RBC: 3.39 MIL/uL — ABNORMAL LOW (ref 4.22–5.81)
RDW: 15.8 % — ABNORMAL HIGH (ref 11.5–15.5)
WBC: 14 10*3/uL — ABNORMAL HIGH (ref 4.0–10.5)
nRBC: 0 % (ref 0.0–0.2)

## 2019-09-10 MED ORDER — METOPROLOL TARTRATE 25 MG PO TABS: 25.0000 mg | ORAL_TABLET | Freq: Two times a day (BID) | ORAL | Status: DC

## 2019-09-10 MED ORDER — SODIUM CHLORIDE 0.9 % IV SOLN: INTRAVENOUS | Status: DC

## 2019-09-10 MED ORDER — SODIUM BICARBONATE 8.4 % IV SOLN
50.0000 meq | Freq: Once | INTRAVENOUS | Status: AC
  Administered 2019-09-10: 50 meq via INTRAVENOUS
  Filled 2019-09-10: qty 50

## 2019-09-10 MED ORDER — SODIUM BICARBONATE 650 MG PO TABS
650.0000 mg | ORAL_TABLET | Freq: Three times a day (TID) | ORAL | Status: DC
  Administered 2019-09-10 – 2019-09-17 (×21): 650 mg via ORAL
  Filled 2019-09-10 (×21): qty 1

## 2019-09-10 NOTE — Progress Notes (Signed)
This shift  Pt's HR sustained in the 50's. MD notified and advised to hold  Metoprolol pm dose. Will continue to monitor

## 2019-09-10 NOTE — Progress Notes (Signed)
Subjective: 59 year old male admitted to the hospital for AKI, sepsis from left lower extremity cellulitis.  Overall reports that he is feeling much better.  Still having some left knee pain but otherwise feels better.  He is able to move his shoulders and his arms better now as well he is actually able to pull himself up in bed.  Currently denies any fevers, chills, nausea, vomiting.  No chest pain or shortness of breath.   Objective: AAO x3, NAD- sitting up in bed.  Dressings were changed today.  Began noticed multiple superficial partial-thickness wounds to bilateral lower extremities.  There is decreased edema bilaterally in particular is decreased erythema to the left foot.  There is no areas of fluctuation or crepitation.  Some clear drainage expressed.  Full-thickness wound on the dorsal lateral aspect of right foot with ACell graft intact.  There is no drainage from this area today.  There is no fluctuation or crepitation.  Ankle, foot range of motion without pain.  Assessment: Cellulitis, sepsis- clinically improving  Plan: Currently afebrile and white blood cell count improved today.  Creatinine and liver function elevated.  I did reapply dressings today.  I cleaned the legs.  Xeroform was applied followed by ABD pads, Kerlix, and Ace bandages.  Encouraged elevation.  Continue with physical therapy and likely discharge to SNF when medically stable.  Continue IV antibiotics for now.  Nursing to do dressing changes on Monday.  Celesta Gentile, DPM O: [phone] C: [phone]

## 2019-09-10 NOTE — Progress Notes (Signed)
PROGRESS NOTE    Jeffery [name]  TIW:[id number] DOB: 1960/07/17 DOA: 09/07/2019 PCP: [name], MD   Brief Narrative:  59 y.o. male with medical history significant of Obesity, DM2, HTN, P Afib, EtOH use, Charcot's foot, Chronic Pain syndrome, Hx of Cardioversion, diastolic CHF, chronic LE blister and edema s/p excision by podiatry comes with worsening of left lower ext foot pain.  Admitted for sepsis secondary to purulent left lower extremity cellulitis.  Hospital course complicated by AKI. Patient with persistent left knee pain x 1 week duration. Has history of snoring, has been told that he has OSA but never had sleep study. Has had bradycardia hs.    Assessment & Plan:   Principal Problem:   Cellulitis of left lower extremity Active Problems:   Charcot foot due to diabetes mellitus ([place])   Essential hypertension   Uncontrolled type 2 diabetes mellitus with diabetic polyneuropathy, without long-term current use of insulin (HCC)   Paroxysmal atrial fibrillation (HCC)   Chronic anticoagulation   AKI (acute kidney injury) ([place])   Left leg cellulitis   Sepsis ([place])  Sepsis secondary to left Lower extremity Cellulitis with some purulent; POA B/L lower extremity chronic skin changes with blister -Sepsis physiology improving. -UA-possible mild UTI?,  Blood cultures negative -IV vancomycin-day 4, renally dose per pharmacy, monitoring closely in setting of AKI.  Cellulitis order set used -Pain control, bowel regimen -Ultrasound ABI-difficult to obtain due to blisters -MRI-left lower extremity cellulitis but no evidence of abscess/osteomyelitis--Podiatry following -Routine wound care  Left shoulder and left ankle pain, improved -X-rays negative for acute pathology shows multiple chronic changes. -Doing better today therefore we will hold off on MRI -Pain control -Continue work with PT/OT-SNF  Left knee pain with edema, x1 week --No history of gout but has OA --Ordered  Xray of the knee, may need Ortho or IR for aspiration if effusion noted.   Acute Kidney Injury on CKD Stage IIIa, worsening -Baseline creatinine 1.6, admission creatinine 2.4.  Today's 3.6.   -Renal US with no evidence of retention.  -Starting bicarb, received 1 amp (low bicarb this am), getting po bicarb, restart IV Fluids (NS but may change to 1/2 NS if sodium level trending up).   Essential Hypertension  -Stable on Metoprolol but dose reduced due to bradycardia.  DM2 with neuropathy , in decent range 107-144 -Insulin sliding scale and Accu-Chek.  Lantus 50 units twice daily -Gabapentin -Diabetic diet  Paroxysmal A Fib, s/p Cardioversion -On metoprolol, amiodarone and Eliquis  Bradycardia, sinus.  While sleeping, HR in the upper 40s-50s, Reduced dose of metoprolol. Likely OSA induced. Ordered CPAP per RT, hs. Patient will need outpatient sleep study.    Elevated LFTs.  Denies abdominal pain. Will order RUQ [place] for liver eval.   DVT prophylaxis: Eliquis Code Status: Full code Family Communication: mother has been updated this weekend  Status is: Inpatient  Remains inpatient appropriate because:IV treatments appropriate due to intensity of illness or inability to take PO   Dispo: The patient is from: Home              Anticipated d/c is to: SNF              Anticipated d/c date is: 2 days              Patient currently is not medically stable to d/c.  Maintain hospital stay due to worsening renal function requiring further work-up.  In the meantime requires IV antibiotics   Subjective: He  continues to have left knee pain. He will try to ambulate this afternoon. His left foot pain is improving.   Review of Systems No nausea, vomiting, abdominal pain, or chest pain.   Examination: Constitutional: Not in acute distress, morbidly obese Respiratory: No respiratory distress, no wheezing Cardiovascular: Normal sinus rhythm, S1 and S2 present Abdomen: Nontender  nondistended, obese Musculoskeletal: Left knee with no erythema, edema, limited ROM due to pain Skin: bilateral dressings in place.  Left toes with no cyanosis, nl ROM Neurologic: A&O x3, no facial droop, following commands.  Psychiatric: Appropriate mood and affect.    Objective: Vitals:   09/09/19 1415 09/09/19 2124 09/10/19 0516 09/10/19 1324  BP: 131/62 131/88 (!) 161/76 125/74  Pulse: 61 (!) 59 72 (!) 55  Resp: 20 20 20 18   Temp: 98.2 F (36.8 C) 98.6 F (37 C) 97.6 F (36.4 C) 97.9 F (36.6 C)  TempSrc: Oral Oral Oral Oral  SpO2: 96% 94% 97% 95%  Weight:   (!) 166.7 kg   Height:        Intake/Output Summary (Last 24 hours) at 09/10/2019 1739 Last data filed at 09/10/2019 1408 Gross per 24 hour  Intake 1440 ml  Output 2100 ml  Net -660 ml   Filed Weights   09/07/19 2010 09/09/19 0500 09/10/19 0516  Weight: (!) 168.7 kg (!) 167.1 kg (!) 166.7 kg     Data Reviewed:   CBC: Recent Labs  Lab 09/07/19 1221 09/08/19 0344 09/09/19 0620 09/10/19 0652  WBC 19.4* 16.5* 18.5* 14.0*  NEUTROABS 15.5*  --   --   --   HGB 10.1* 8.5* 9.7* 9.2*  HCT 33.9* 27.5* 33.3* 30.3*  MCV 91.6 90.2 92.5 89.4  PLT 506* 406* 513* 161*   Basic Metabolic Panel: Recent Labs  Lab 09/04/19 1053 09/07/19 1221 09/08/19 0344 09/09/19 0620 09/10/19 0652  NA 138 137 133* 133* 135  K 6.1* 4.8 4.6 4.8 4.9  CL 103 102 102 102 103  CO2 20 24 20* 19* 20*  GLUCOSE 158* 195* 124* 96 84  BUN 32* 42* 47* 67* 71*  CREATININE 1.63* 2.48* 2.37* 3.74* 3.60*  CALCIUM 8.3* 8.3* 8.0* 8.5* 8.4*  MG  --   --  2.4 2.6* 2.6*   GFR: Estimated Creatinine Clearance: 37.1 mL/min (A) (by C-G formula based on SCr of 3.6 mg/dL (H)). Liver Function Tests: Recent Labs  Lab 09/07/19 1221 09/08/19 0344 09/09/19 0620 09/10/19 0652  AST 24 21 52* 91*  ALT 31 23 40 66*  ALKPHOS 84 64 78 77  BILITOT 0.9 0.8 0.8 0.6  PROT 7.6 6.2* 7.2 6.8  ALBUMIN 2.3* 1.9* 2.1* 2.0*   No results for input(s): LIPASE,  AMYLASE in the last 168 hours. No results for input(s): AMMONIA in the last 168 hours. Coagulation Profile: No results for input(s): INR, PROTIME in the last 168 hours. Cardiac Enzymes: Recent Labs  Lab 09/07/19 1221  CKTOTAL 322   BNP (last 3 results) No results for input(s): PROBNP in the last 8760 hours. HbA1C: No results for input(s): HGBA1C in the last 72 hours. CBG: Recent Labs  Lab 09/09/19 1654 09/09/19 2126 09/10/19 0820 09/10/19 1236 09/10/19 1640  GLUCAP 100* 156* 70 72 90   Lipid Profile: No results for input(s): CHOL, HDL, LDLCALC, TRIG, CHOLHDL, LDLDIRECT in the last 72 hours. Thyroid Function Tests: No results for input(s): TSH, T4TOTAL, FREET4, T3FREE, THYROIDAB in the last 72 hours. Anemia Panel: No results for input(s): VITAMINB12, FOLATE, FERRITIN, TIBC, IRON, RETICCTPCT  in the last 72 hours. Sepsis Labs: Recent Labs  Lab 09/07/19 1221  LATICACIDVEN 0.8    Recent Results (from the past 240 hour(s))  Culture, blood (Routine X 2) w Reflex to ID Panel     Status: None (Preliminary result)   Collection Time: 09/07/19 12:25 PM   Specimen: BLOOD  Result Value Ref Range Status   Specimen Description   Final    BLOOD RIGHT ANTECUBITAL Performed at [facility] [address], [place], [place] [address]    Special Requests   Final    BOTTLES DRAWN AEROBIC AND ANAEROBIC Blood Culture adequate volume Performed at [facility] [address], [place], [place] [address]    Culture   Final    NO GROWTH 3 DAYS Performed at [facility], [place] [address], [place], [place] [address]    Report Status PENDING  Incomplete  SARS Coronavirus 2 by RT PCR (hospital order, performed in [facility] hospital lab) Nasopharyngeal Nasopharyngeal Swab     Status: None   Collection Time: 09/07/19  2:37 PM   Specimen: Nasopharyngeal Swab  Result Value Ref Range Status   SARS Coronavirus 2 NEGATIVE NEGATIVE Final    Comment:  (NOTE) SARS-CoV-2 target nucleic acids are NOT DETECTED.  The SARS-CoV-2 RNA is generally detectable in upper and lower respiratory specimens during the acute phase of infection. The lowest concentration of SARS-CoV-2 viral copies this assay can detect is 250 copies / mL. A negative result does not preclude SARS-CoV-2 infection and should not be used as the sole basis for treatment or other patient management decisions.  A negative result may occur with improper specimen collection / handling, submission of specimen other than nasopharyngeal swab, presence of viral mutation(s) within the areas targeted by this assay, and inadequate number of viral copies (<250 copies / mL). A negative result must be combined with clinical observations, patient history, and epidemiological information.  Fact Sheet for Patients:   StrictlyIdeas.no  Fact Sheet for Healthcare Providers: BankingDealers.co.za  This test is not yet approved or  cleared by the [place] FDA and has been authorized for detection and/or diagnosis of SARS-CoV-2 by FDA under an Emergency Use Authorization (EUA).  This EUA will remain in effect (meaning this test can be used) for the duration of the COVID-19 declaration under Section 564(b)(1) of the Act, 21 U.S.C. section 360bbb-3(b)(1), unless the authorization is terminated or revoked sooner.  Performed at [facility], [place] [address], [place], [place] [address]   Culture, blood (Routine X 2) w Reflex to ID Panel     Status: None (Preliminary result)   Collection Time: 09/07/19  3:09 PM   Specimen: BLOOD RIGHT HAND  Result Value Ref Range Status   Specimen Description   Final    BLOOD RIGHT HAND Performed at [facility] [address] [place], [place] [address]    Special Requests   Final    BOTTLES DRAWN AEROBIC ONLY Blood Culture results may not be optimal due to an inadequate  volume of blood received in culture bottles Performed at [facility] [address], [place], [place] [address]    Culture   Final    NO GROWTH 3 DAYS Performed at [facility], [place] [address], [place],  [address]    Report Status PENDING  Incomplete         Radiology Studies: US RENAL  Result Date: 09/09/2019 CLINICAL DATA:  Acute kidney injury EXAM: RENAL / URINARY TRACT ULTRASOUND COMPLETE COMPARISON:  May 14th 2017 FINDINGS: Markedly limited exam due to patient body habitus. Right Kidney: Renal measurements: 11.3 x 6.1 x 6.2 cm = volume: 225 mL. No hydronephrosis. Very limited assessment of the kidneys with estimation of renal size given poor visualization. Parenchymal thickness is grossly preserved. Left Kidney: Renal measurements: 12 x 5.6 x 5.9 (volume = 210) cm = volume: 210 mL. Parenchymal thickness grossly preserved and without signs of hydronephrosis. The very limited assessment due to body habitus as described. Bladder: Appears normal for degree of bladder distention. Other: None. IMPRESSION: 1. Very limited evaluation. No sign of gross hydronephrosis with relative preservation of cortical thickness. Electronically Signed   By: Zetta Bills M.D.   On: 09/09/2019 11:13        Scheduled Meds: . amiodarone  200 mg Oral BID  . apixaban  5 mg Oral BID  . atorvastatin  20 mg Oral Daily  . gabapentin  300 mg Oral BID  . insulin aspart  0-15 Units Subcutaneous TID WC  . insulin aspart  0-5 Units Subcutaneous QHS  . insulin glargine  50 Units Subcutaneous BID  . metoprolol tartrate  25 mg Oral BID  . sodium bicarbonate  650 mg Oral TID   Continuous Infusions: . sodium chloride 75 mL/hr at 09/10/19 1236  . vancomycin 2,000 mg (09/10/19 1644)     LOS: 3 days   Time spent= 35 mins    Blain Pais, MD [facility]  If 7PM-7AM, please contact night-coverage  09/10/2019, 5:39 PM

## 2019-09-11 ENCOUNTER — Inpatient Hospital Stay (HOSPITAL_COMMUNITY): Payer: Medicaid Other

## 2019-09-11 DIAGNOSIS — M25562 Pain in left knee: Secondary | ICD-10-CM

## 2019-09-11 LAB — CBC
HCT: 29.4 % — ABNORMAL LOW (ref 39.0–52.0)
Hemoglobin: 8.7 g/dL — ABNORMAL LOW (ref 13.0–17.0)
MCH: 27.1 pg (ref 26.0–34.0)
MCHC: 29.6 g/dL — ABNORMAL LOW (ref 30.0–36.0)
MCV: 91.6 fL (ref 80.0–100.0)
Platelets: 474 10*3/uL — ABNORMAL HIGH (ref 150–400)
RBC: 3.21 MIL/uL — ABNORMAL LOW (ref 4.22–5.81)
RDW: 15.8 % — ABNORMAL HIGH (ref 11.5–15.5)
WBC: 14 10*3/uL — ABNORMAL HIGH (ref 4.0–10.5)
nRBC: 0 % (ref 0.0–0.2)

## 2019-09-11 LAB — GLUCOSE, CAPILLARY
Glucose-Capillary: 102 mg/dL — ABNORMAL HIGH (ref 70–99)
Glucose-Capillary: 130 mg/dL — ABNORMAL HIGH (ref 70–99)
Glucose-Capillary: 140 mg/dL — ABNORMAL HIGH (ref 70–99)
Glucose-Capillary: 140 mg/dL — ABNORMAL HIGH (ref 70–99)

## 2019-09-11 LAB — SYNOVIAL CELL COUNT + DIFF, W/ CRYSTALS
Crystals, Fluid: NONE SEEN
Lymphocytes-Synovial Fld: 1 % (ref 0–20)
Monocyte-Macrophage-Synovial Fluid: 5 % — ABNORMAL LOW (ref 50–90)
Neutrophil, Synovial: 94 % — ABNORMAL HIGH (ref 0–25)
WBC, Synovial: 14000 /mm3 — ABNORMAL HIGH (ref 0–200)

## 2019-09-11 LAB — RENAL FUNCTION PANEL
Albumin: 1.9 g/dL — ABNORMAL LOW (ref 3.5–5.0)
Anion gap: 12 (ref 5–15)
BUN: 72 mg/dL — ABNORMAL HIGH (ref 6–20)
CO2: 20 mmol/L — ABNORMAL LOW (ref 22–32)
Calcium: 8.1 mg/dL — ABNORMAL LOW (ref 8.9–10.3)
Chloride: 104 mmol/L (ref 98–111)
Creatinine, Ser: 3.67 mg/dL — ABNORMAL HIGH (ref 0.61–1.24)
GFR calc Af Amer: 20 mL/min — ABNORMAL LOW (ref >60)
GFR calc non Af Amer: 17 mL/min — ABNORMAL LOW (ref >60)
Glucose, Bld: 113 mg/dL — ABNORMAL HIGH (ref 70–99)
Phosphorus: 6.9 mg/dL — ABNORMAL HIGH (ref 2.5–4.6)
Potassium: 4.6 mmol/L (ref 3.5–5.1)
Sodium: 136 mmol/L (ref 135–145)

## 2019-09-11 LAB — URIC ACID: Uric Acid, Serum: 9.5 mg/dL — ABNORMAL HIGH (ref 3.7–8.6)

## 2019-09-11 LAB — MAGNESIUM: Magnesium: 2.7 mg/dL — ABNORMAL HIGH (ref 1.7–2.4)

## 2019-09-11 MED ORDER — BUFFERED LIDOCAINE (PF) 1% IJ SOSY
5.0000 mL | PREFILLED_SYRINGE | INTRAMUSCULAR | Status: AC
  Administered 2019-09-11: 5 mL via SUBCUTANEOUS
  Filled 2019-09-11: qty 5

## 2019-09-11 MED ORDER — LIDOCAINE HCL 2 % IJ SOLN
INTRAMUSCULAR | Status: AC
  Filled 2019-09-11: qty 20

## 2019-09-11 MED ORDER — COLCHICINE 0.6 MG PO TABS
0.6000 mg | ORAL_TABLET | Freq: Three times a day (TID) | ORAL | Status: AC
  Administered 2019-09-11 (×3): 0.6 mg via ORAL
  Filled 2019-09-11 (×3): qty 1

## 2019-09-11 MED ORDER — BUPIVACAINE HCL (PF) 0.25 % IJ SOLN
10.0000 mL | INTRAMUSCULAR | Status: AC
  Administered 2019-09-11: 10 mL
  Filled 2019-09-11: qty 10

## 2019-09-11 MED ORDER — COLCHICINE 0.6 MG PO TABS: 0.6000 mg | ORAL_TABLET | Freq: Two times a day (BID) | ORAL | Status: DC

## 2019-09-11 NOTE — Progress Notes (Signed)
Pt refused CPAP qhs.  Pt stated he didn't do good wearing it while sleeping last night and stated he doesn't want to wear it tonight.

## 2019-09-11 NOTE — Consult Note (Signed)
ORTHOPAEDIC CONSULTATION  REQUESTING PHYSICIAN: Damita Lack, MD  Chief Complaint: Left knee pain and swelling  HPI: Jeffery [name] is a 59 y.o. male who presented to the [facility] hospital with worsening LLE cellulitis and sepsis.  Hospital course complicated by AKI.  Has history of gout.  He had left shoulder, left ankle and left knee pain.  Left knee pain persists.  Denies any f/c or constitutional symptoms.    Past Medical History:  Diagnosis Date  . Atrial fibrillation ([place])   . Cellulitis and abscess of foot 07/2019   RIGHT FOOT  . Charcot's joint of right foot   . DDD (degenerative disc disease), lumbar   . Diabetes mellitus without complication ([place])   . Fatty liver   . Hypertension   . Obesity   . Rotator cuff disorder   . Shoulder impingement, right   . Spinal stenosis at L4-L5 level    Past Surgical History:  Procedure Laterality Date  . AMPUTATION Left 10/02/2014   Procedure: Left Third toe amputation ;  Surgeon: [name], MD;  Location: [facility];  Service: Orthopedics;  Laterality: Left;  Regular bed, wants to follow hip  . ANTERIOR CRUCIATE LIGAMENT REPAIR Right 90   reconstruction  . APPLICATION OF WOUND VAC Left 10/02/2014   Procedure: APPLICATION OF WOUND VAC; toe Surgeon: [name], MD;  Location: [facility];  Service: Orthopedics;  Laterality: Left;  . CARDIOVERSION N/A 04/26/2019   Procedure: CARDIOVERSION;  Surgeon: Pixie Casino, MD;  Location: [facility] ENDOSCOPY;  Service: Cardiovascular;  Laterality: N/A;  . CARDIOVERSION N/A 05/18/2019   Procedure: CARDIOVERSION (CATH LAB);  Surgeon: Constance Haw, MD;  Location: [facility] CV LAB;  Service: Cardiovascular;  Laterality: N/A;  . I & D EXTREMITY Left 10/05/2014   Procedure: IRRIGATION AND DEBRIDEMENT LEFT FOOT;  Surgeon: [name], MD;  Location: [facility];  Service: Orthopedics;  Laterality: Left;  . INCISION AND DRAINAGE Right 08/09/2019   Procedure: INCISION AND DRAINAGE RIGHT FOOT;  Surgeon:  Trula Slade, DPM;  Location: [facility];  Service: Podiatry;  Laterality: Right;  . KNEE ARTHROSCOPY W/ ACL RECONSTRUCTION Right   . TOTAL KNEE ARTHROPLASTY Right 03/28/2015  . TOTAL KNEE ARTHROPLASTY Right 03/28/2015   Procedure: RIGHT TOTAL KNEE ARTHROPLASTY;  Surgeon: [name], MD;  Location: [facility];  Service: Orthopedics;  Laterality: Right;  . WOUND DEBRIDEMENT Right 08/14/2019   Procedure: DEBRIDEMENT WOUND;  Surgeon: Trula Slade, DPM;  Location: [facility];  Service: Podiatry;  Laterality: Right;   Social History   Socioeconomic History  . Marital status: Single    Spouse name: Not on file  . Number of children: Not on file  . Years of education: Not on file  . Highest education level: Not on file  Occupational History  . Occupation: Management consultant  Tobacco Use  . Smoking status: Former Smoker    Packs/day: 0.00    Years: 38.00    Pack years: 0.00    Types: Cigarettes  . Smokeless tobacco: Never Used  . Tobacco comment: QUIT 10/2016  Vaping Use  . Vaping Use: Never used  Substance and Sexual Activity  . Alcohol use: No    Alcohol/week: 5.0 - 6.0 standard drinks    Types: 5 - 6 Cans of beer per week  . Drug use: Yes    Types: Marijuana    Comment: last week ; denies 03/24/17  . Sexual activity: Not on file  Other Topics Concern  . Not on file  Social History Narrative   Lives alone.   Social Determinants of Health   Financial Resource Strain:   . Difficulty of Paying Living Expenses:   Food Insecurity:   . Worried About Charity fundraiser in the Last Year:   . Arboriculturist in the Last Year:   Transportation Needs:   . Film/video editor (Medical):   Marland Kitchen Lack of Transportation (Non-Medical):   Physical Activity:   . Days of Exercise per Week:   . Minutes of Exercise per Session:   Stress:   . Feeling of Stress :   Social Connections:   . Frequency of Communication with Friends and Family:   . Frequency of Social Gatherings with  Friends and Family:   . Attends Religious Services:   . Active Member of Clubs or Organizations:   . Attends Archivist Meetings:   Marland Kitchen Marital Status:    Family History  Problem Relation Age of Onset  . Diabetes Father   . Hypertension Father   . Heart failure Father    - negative except otherwise stated in the family history section Allergies  Allergen Reactions  . Metformin And Related Other (See Comments)    GI upset   Prior to Admission medications   Medication Sig Start Date End Date Taking? Authorizing Provider  amiodarone (PACERONE) 200 MG tablet Take 200 mg by mouth 2 (two) times daily.    Yes [provider]  apixaban (ELIQUIS) 5 MG TABS tablet Take 1 tablet (5 mg total) by mouth 2 (two) times daily. 06/20/19  Yes [name], MD  atorvastatin (LIPITOR) 20 MG tablet Take 1 tablet (20 mg total) by mouth daily. 03/28/19  Yes [name], MD  doxycycline (VIBRA-TABS) 100 MG tablet Take 1 tablet (100 mg total) by mouth 2 (two) times daily. 09/05/19  Yes Trula Slade, DPM  furosemide (LASIX) 40 MG tablet Take 1 tablet (40 mg total) by mouth daily. Patient taking differently: Take 40-80 mg by mouth See admin instructions. Per MD pt started taking 80mg  09/06/19 x3days then he is to resume back to 40mg  QD 04/21/19 09/07/19 Yes Troy Sine, MD  gabapentin (NEURONTIN) 300 MG capsule Take 1 capsule (300 mg total) by mouth 2 (two) times daily. Patient taking differently: Take 600 mg by mouth at bedtime as needed (pain).  03/28/19  Yes Newlin, Charlane Ferretti, MD  insulin glargine (LANTUS SOLOSTAR) 100 UNIT/ML Solostar Pen Inject 50 Units into the skin 2 (two) times daily. Patient taking differently: Inject 51 Units into the skin 2 (two) times daily.  06/20/19  Yes Newlin, Charlane Ferretti, MD  insulin lispro (INSULIN LISPRO) 100 UNIT/ML KwikPen Junior Inject 0.08 mLs (8 Units total) into the skin 3 (three) times daily. Patient taking differently: Inject 8 Units into the skin 3  (three) times daily as needed (Low blood sugar).  03/28/19  Yes [name], MD  metoprolol tartrate (LOPRESSOR) 100 MG tablet Take 0.5 tablets (50 mg total) by mouth 2 (two) times daily. 08/24/19 11/22/19 Yes Camnitz, Will Hassell Done, MD  oxyCODONE-acetaminophen (PERCOCET) 10-325 MG tablet Take 1 tablet by mouth every 4 (four) hours as needed for pain.    Yes [provider]  polyethylene glycol (MIRALAX / GLYCOLAX) 17 g packet Take 17 g by mouth daily as needed for mild constipation. 08/16/19  Yes Nita Sells, MD  Sennosides (EX-LAX) 15 MG TABS Take 15 mg by mouth daily as needed (Constipation).  Yes [provider]  sodium chloride (OCEAN) 0.65 % SOLN nasal spray Place 1 spray into both nostrils daily as needed for congestion.    Yes [provider]  Accu-Chek FastClix Lancets MISC Use as directed to check blood sugar at least twice daily. E11.8 E11.65 Z79.4 Patient taking differently: 1 each by Other route See admin instructions. Use as directed to check blood sugar at least twice daily. E11.8 E11.65 Z79.4 10/18/18   [name], MD  Blood Glucose Monitoring Suppl (ACCU-CHEK AVIVA) device Use as instructed to check blood sugar 2 times daily. E11.8 E11.65 Z79.4 Patient taking differently: 1 each by Other route See admin instructions. Use as instructed to check blood sugar 2 times daily. E11.8 E11.65 Z79.4 10/18/18 10/18/19  [name], MD  glucose blood (ACCU-CHEK AVIVA) test strip Use as instructed to check blood sugar 2 times daily. E11.8 E11.65 Z79.4 Patient taking differently: 1 each by Other route See admin instructions. Use as instructed to check blood sugar 2 times daily. E11.8 E11.65 Z79.4 10/18/18   [name], MD  Insulin Pen Needle (B-D ULTRAFINE III SHORT PEN) 31G X 8 MM MISC 1 each by Does not apply route 3 (three) times daily. 10/12/17   [name], MD  Insulin Syringe-Needle U-100 (TRUEPLUS INSULIN SYRINGE) 30G X 5/16" 0.5 ML MISC Use as  directed 3 times daily Patient taking differently: 1 each by Other route See admin instructions. Use as directed 3 times daily 10/06/17   [name], MD  liraglutide (VICTOZA) 18 MG/3ML SOPN Inject 0.3 mLs (1.8 mg total) into the skin daily. Patient not taking: Reported on 09/07/2019 06/20/19   [name], MD  TRUEPLUS INSULIN SYRINGE 30G X 5/16" 0.5 ML MISC USE AS DIRECTED 3 TIMES DAILY Patient taking differently: 1 each by Other route in the morning, at noon, and at bedtime.  04/03/16   Tresa Garter, MD   DG Knee 1-2 Views Left  Result Date: 09/10/2019 CLINICAL DATA:  Left lower extremity cellulitis EXAM: LEFT KNEE - 1-2 VIEW COMPARISON:  None. FINDINGS: Mild degenerative changes in the left knee with joint space narrowing and spurring in all 3 compartments. Small joint effusion. Anterior soft tissue swelling. No acute bony abnormality. Specifically, no fracture, subluxation, or dislocation. IMPRESSION: Soft tissue swelling. Degenerative changes with small joint effusion. No acute bony abnormality. Electronically Signed   By: Rolm Baptise M.D.   On: 09/10/2019 19:33   US Abdomen Limited RUQ  Result Date: 09/11/2019 CLINICAL DATA:  Elevated LFTs. History fatty liver, hypertension, obesity and diabetes. EXAM: ULTRASOUND ABDOMEN LIMITED RIGHT UPPER QUADRANT COMPARISON:  Right upper quadrant abdominal ultrasound-08/11/2015 FINDINGS: Examination is degraded due to patient body habitus, bowel gas and poor sonographic window Gallbladder: About the gallbladder is underdistended and poorly visualized. Given this limitation, there is no gallbladder wall thickening or evidence of echogenic gallstones or biliary sludge. Negative sonographic Murphy sign. Common bile duct: Diameter: Obscured by bowel gas. Liver: There is mild diffuse increased slightly coarsened echogenicity of the hepatic parenchyma. No discrete hepatic lesions. No intrahepatic biliary duct dilatation. No ascites. Portal vein is  patent on color Doppler imaging with normal direction of blood flow towards the liver. Other: None. IMPRESSION: 1. Degraded examination secondary to patient body habitus and bowel gas. 2. Similar findings suggestive of hepatic steatosis. Electronically Signed   By: Sandi Mariscal M.D.   On: 09/11/2019 07:53   - pertinent xrays, CT, MRI studies were reviewed and independently interpreted  Positive ROS: All other systems have  been reviewed and were otherwise negative with the exception of those mentioned in the HPI and as above.  Physical Exam: General: No acute distress Cardiovascular: No pedal edema Respiratory: No cyanosis, no use of accessory musculature GI: No organomegaly, abdomen is soft and non-tender Skin: No lesions in the area of chief complaint Neurologic: Sensation intact distally Psychiatric: Patient is at baseline mood and affect Lymphatic: No axillary or cervical lymphadenopathy  MUSCULOSKELETAL:  - 2+ pitting edema of LLE - large joint effusion - no redness, slight warmth, painful ROM - NVI distally  Assessment: Left knee pain, effusion likely gout flare up  Plan: - I aspirated the left knee at bedside, 60 cc obtained of yellowish fluid consistent with gout fluid based on gross appearance - fluid sent to lab - primary team may treat the suspected gout with IV steroids - follow up as outpatient as needed  Thank you for the consult and the opportunity to see Jeffery [name]. Eduard Roux, MD [facility] 1:18 PM

## 2019-09-11 NOTE — Progress Notes (Signed)
Patient dressing change today by nursing staff. Changed dressing according to md orders, removed old dressing. Wounds are bleeding today- moderate amount of serosanguinous drainage noted on dressing removed. Dr. Erlinda Hong aware and present during dressing change.

## 2019-09-11 NOTE — Progress Notes (Signed)
Patient metoprolol and pacerone held per patient sinus brady/. Dr. Reesa Chew aware and in agreeable.

## 2019-09-11 NOTE — Progress Notes (Signed)
[place] for Colchicine - renal dosing consideration Indication: gout flair  Allergies  Allergen Reactions  . Metformin And Related Other (See Comments)    GI upset   Patient Measurements: Height: 6' 2.02" (188 cm) Weight: (!) 166.3 kg (366 lb 11.2 oz) IBW/kg (Calculated) : 82.24  Vital Signs: Temp: 97.8 F (36.6 C) (06/14 0459) Temp Source: Oral (06/14 0459) BP: 151/75 (06/14 0459) Pulse Rate: 55 (06/14 0459) Intake/Output from previous day: 06/13 0701 - 06/14 0700 In: 2971.4 [P.O.:1440; I.V.:1131.4; IV Piggyback:400] Out: 3125 [Urine:3125] Intake/Output from this shift: Total I/O In: 328.1 [I.V.:328.1] Out: 400 [Urine:400]  Labs: Recent Labs    09/09/19 0620 09/10/19 0652 09/11/19 0558  WBC 18.5* 14.0* 14.0*  HGB 9.7* 9.2* 8.7*  HCT 33.3* 30.3* 29.4*  PLT 513* 479* 474*  CREATININE 3.74* 3.60* 3.67*  MG 2.6* 2.6* 2.7*  PHOS  --   --  6.9*  ALBUMIN 2.1* 2.0* 1.9*  PROT 7.2 6.8  --   AST 52* 91*  --   ALT 40 66*  --   ALKPHOS 78 77  --   BILITOT 0.8 0.6  --    Estimated Creatinine Clearance: 36.4 mL/min (A) (by C-G formula based on SCr of 3.67 mg/dL (H)).  Medical History: Past Medical History:  Diagnosis Date  . Atrial fibrillation ([place])   . Cellulitis and abscess of foot 07/2019   RIGHT FOOT  . Charcot's joint of right foot   . DDD (degenerative disc disease), lumbar   . Diabetes mellitus without complication ([place])   . Fatty liver   . Hypertension   . Obesity   . Rotator cuff disorder   . Shoulder impingement, right   . Spinal stenosis at L4-L5 level    Medications:  Scheduled:  . amiodarone  200 mg Oral BID  . apixaban  5 mg Oral BID  . atorvastatin  20 mg Oral Daily  . colchicine  0.6 mg Oral TID  . gabapentin  300 mg Oral BID  . insulin aspart  0-15 Units Subcutaneous TID WC  . insulin aspart  0-5 Units Subcutaneous QHS  . insulin glargine  50 Units Subcutaneous BID  . sodium bicarbonate  650  mg Oral TID   Assessment: Jeffery [name] with worsening LLE pain. Gout flare PMH: obesity, DM2, HTN, a-fib, EtOH use, Charcot's foot, chronic pain syndrome, diastolic CHF, chronic LE blister and edema s/p excision by podiatry, recent admission 07/2019 for MSSA bacteremia complicated by AKI   Pharmacy requested to assess Colchicine dose with renal dysfunction. Clearance < 30 ml/min (normalized - 22 ml/min). Suggested total dose day 1 1.8mg  - 0.6mg  ordered x 3 today 6/14. Concurrent Amiodarone can increase Colchicine levels by inhibition of CytP 450 inhibition and PGP inhibition.   Plan: Suggested treat x 1 day only with Colchicine, would not re-dose x 14 days per literature.   Minda Ditto PharmD 09/11/2019,11:42 AM

## 2019-09-11 NOTE — Plan of Care (Signed)

## 2019-09-11 NOTE — Progress Notes (Signed)
PROGRESS NOTE    Jeffery [name]  IRJ:[id number] DOB: 1960-10-12 DOA: 09/07/2019 PCP: [name], MD   Brief Narrative:  59 y.o. male with medical history significant of Obesity, DM2, HTN, P Afib, EtOH use, Charcot's foot, Chronic Pain syndrome, Hx of Cardioversion, diastolic CHF, chronic LE blister and edema s/p excision by podiatry comes with worsening of left lower ext foot pain.  Admitted for sepsis secondary to purulent left lower extremity cellulitis.  Hospital course complicated by AKI.  MRI left leg suggested extensive cellulitis with moderate left knee effusion.  Orthopedic consulted.   Assessment & Plan:   Principal Problem:   Cellulitis of left lower extremity Active Problems:   Charcot foot due to diabetes mellitus ([place])   Essential hypertension   Uncontrolled type 2 diabetes mellitus with diabetic polyneuropathy, without long-term current use of insulin (H[facility])   Paroxysmal atrial fibrillation ([facility])   Chronic anticoagulation   AKI (acute kidney injury) ([place])   Left leg cellulitis   Sepsis ([place])  Sepsis secondary to left Lower extremity Cellulitis with some purulent; POA B/L lower extremity chronic skin changes with blister -Sepsis physiology improving. -UA-possible mild UTI?,  Blood cultures negative -IV vancomycin-day 3.  Cellulitis order set used -Pain control, bowel regimen -Ultrasound ABI-difficult to obtain due to blisters -MRI-left lower extremity cellulitis but no evidence of abscess/osteomyelitis -Routine wound care  Left shoulder and left ankle pain, improved Acute mild gout flare Moderate left knee effusion-inflammatory versus infectious -X-ray showed chronic changes.  MRI showed soft tissue inflammation concerning for cellulitis with moderate left knee effusion -Pain control -Continue work with PT/OT-SNF -Orthopedic consulted; Dr Erlinda Hong -Elevated uric acid, colchicine 0.6 mg 3 times daily followed by 0.6 mg 2 more doses.  Pharmacy consulted to  adjust this with renal function  Acute Kidney Injury on CKD Stage IIIa, worsening -Baseline creatinine 1.6, admission creatinine 2.4.  Today's 3.67.  Periodic bladder scan.  Foley if necessary. Pharmacy consulted to adjust home meds according to renal function  Sinus bradycardia -Continue amiodarone.  Discontinue metoprolol for now.  Essential Hypertension  -As needed hydralazine if necessary  DM2 with neuropathy , controlled -Insulin sliding scale and Accu-Chek.  Lantus 50 units twice daily -Gabapentin -Diabetic diet  Paroxysmal A Fib, s/p Cardioversion -On  amiodarone and Eliquis     DVT prophylaxis: Eliquis Code Status: Full code Family Communication: Mom called, no answer.   Status is: Inpatient  Remains inpatient appropriate because:IV treatments appropriate due to intensity of illness or inability to take PO   Dispo: The patient is from: Home              Anticipated d/c is to: SNF              Anticipated d/c date is: 1-2 days              Patient currently is not medically stable to d/c.  Still needs IV Abx and continue to work with PT for final disposition.    Subjective: Doing ok, tells me he is able to move his arms and leg better now. Still no complete flexion of his left knee maybe because of the dressing.   Review of Systems Otherwise negative except as per HPI, including: General = no fevers, chills, dizziness,  fatigue HEENT/EYES = negative for loss of vision, double vision, blurred vision,  sore throa Cardiovascular= negative for chest pain, palpitation Respiratory/lungs= negative for shortness of breath, cough, wheezing; hemoptysis,  Gastrointestinal= negative for nausea, vomiting, abdominal pain Genitourinary=  negative for Dysuria MSK = Negative for arthralgia, myalgias Neurology= Negative for headache, numbness, tingling  Psychiatry= Negative for suicidal and homocidal ideation Skin= Negative for Rash   Examination: Constitutional: Not  in acute distress Respiratory: Clear to auscultation bilaterally Cardiovascular: Normal sinus rhythm, no rubs Abdomen: Nontender nondistended good bowel sounds Musculoskeletal: No edema noted Skin: Bilateral lower extremity dressing in place Neurologic: CN 2-12 grossly intact.  And nonfocal Psychiatric: Normal judgment and insight. Alert and oriented x 3. Normal mood.      Objective: Vitals:   09/10/19 0516 09/10/19 1324 09/10/19 2103 09/11/19 0459  BP: (!) 161/76 125/74 138/69 (!) 151/75  Pulse: 72 (!) 55 (!) 50 (!) 55  Resp: 20 18 20 20   Temp: 97.6 F (36.4 C) 97.9 F (36.6 C) 98.5 F (36.9 C) 97.8 F (36.6 C)  TempSrc: Oral Oral Oral Oral  SpO2: 97% 95% 95% 96%  Weight: (!) 166.7 kg   (!) 166.3 kg  Height:        Intake/Output Summary (Last 24 hours) at 09/11/2019 1102 Last data filed at 09/11/2019 0430 Gross per 24 hour  Intake 2491.41 ml  Output 2500 ml  Net -8.59 ml   Filed Weights   09/09/19 0500 09/10/19 0516 09/11/19 0459  Weight: (!) 167.1 kg (!) 166.7 kg (!) 166.3 kg     Data Reviewed:   CBC: Recent Labs  Lab 09/07/19 1221 09/08/19 0344 09/09/19 0620 09/10/19 0652 09/11/19 0558  WBC 19.4* 16.5* 18.5* 14.0* 14.0*  NEUTROABS 15.5*  --   --   --   --   HGB 10.1* 8.5* 9.7* 9.2* 8.7*  HCT 33.9* 27.5* 33.3* 30.3* 29.4*  MCV 91.6 90.2 92.5 89.4 91.6  PLT 506* 406* 513* 479* 810*   Basic Metabolic Panel: Recent Labs  Lab 09/07/19 1221 09/08/19 0344 09/09/19 0620 09/10/19 0652 09/11/19 0558  NA 137 133* 133* 135 136  K 4.8 4.6 4.8 4.9 4.6  CL 102 102 102 103 104  CO2 24 20* 19* 20* 20*  GLUCOSE 195* 124* 96 84 113*  BUN 42* 47* 67* 71* 72*  CREATININE 2.48* 2.37* 3.74* 3.60* 3.67*  CALCIUM 8.3* 8.0* 8.5* 8.4* 8.1*  MG  --  2.4 2.6* 2.6* 2.7*  PHOS  --   --   --   --  6.9*   GFR: Estimated Creatinine Clearance: 36.4 mL/min (A) (by C-G formula based on SCr of 3.67 mg/dL (H)). Liver Function Tests: Recent Labs  Lab 09/07/19 1221  09/08/19 0344 09/09/19 0620 09/10/19 0652 09/11/19 0558  AST 24 21 52* 91*  --   ALT 31 23 40 66*  --   ALKPHOS 84 64 78 77  --   BILITOT 0.9 0.8 0.8 0.6  --   PROT 7.6 6.2* 7.2 6.8  --   ALBUMIN 2.3* 1.9* 2.1* 2.0* 1.9*   No results for input(s): LIPASE, AMYLASE in the last 168 hours. No results for input(s): AMMONIA in the last 168 hours. Coagulation Profile: No results for input(s): INR, PROTIME in the last 168 hours. Cardiac Enzymes: Recent Labs  Lab 09/07/19 1221  CKTOTAL 322   BNP (last 3 results) No results for input(s): PROBNP in the last 8760 hours. HbA1C: No results for input(s): HGBA1C in the last 72 hours. CBG: Recent Labs  Lab 09/10/19 0820 09/10/19 1236 09/10/19 1640 09/10/19 2105 09/11/19 0814  GLUCAP 70 72 90 101* 102*   Lipid Profile: No results for input(s): CHOL, HDL, LDLCALC, TRIG, CHOLHDL, LDLDIRECT in the last  72 hours. Thyroid Function Tests: No results for input(s): TSH, T4TOTAL, FREET4, T3FREE, THYROIDAB in the last 72 hours. Anemia Panel: No results for input(s): VITAMINB12, FOLATE, FERRITIN, TIBC, IRON, RETICCTPCT in the last 72 hours. Sepsis Labs: Recent Labs  Lab 09/07/19 1221  LATICACIDVEN 0.8    Recent Results (from the past 240 hour(s))  Culture, blood (Routine X 2) w Reflex to ID Panel     Status: None (Preliminary result)   Collection Time: 09/07/19 12:25 PM   Specimen: BLOOD  Result Value Ref Range Status   Specimen Description   Final    BLOOD RIGHT ANTECUBITAL Performed at [facility] [address] [place], [place] [address]    Special Requests   Final    BOTTLES DRAWN AEROBIC AND ANAEROBIC Blood Culture adequate volume Performed at [facility] [address], [place], [place] [address]    Culture   Final    NO GROWTH 4 DAYS Performed at [facility], [place] [address], [place], [place] [address]    Report Status PENDING  Incomplete  SARS Coronavirus 2 by RT PCR  (hospital order, performed in [facility] hospital lab) Nasopharyngeal Nasopharyngeal Swab     Status: None   Collection Time: 09/07/19  2:37 PM   Specimen: Nasopharyngeal Swab  Result Value Ref Range Status   SARS Coronavirus 2 NEGATIVE NEGATIVE Final    Comment: (NOTE) SARS-CoV-2 target nucleic acids are NOT DETECTED.  The SARS-CoV-2 RNA is generally detectable in upper and lower respiratory specimens during the acute phase of infection. The lowest concentration of SARS-CoV-2 viral copies this assay can detect is 250 copies / mL. A negative result does not preclude SARS-CoV-2 infection and should not be used as the sole basis for treatment or other patient management decisions.  A negative result may occur with improper specimen collection / handling, submission of specimen other than nasopharyngeal swab, presence of viral mutation(s) within the areas targeted by this assay, and inadequate number of viral copies (<250 copies / mL). A negative result must be combined with clinical observations, patient history, and epidemiological information.  Fact Sheet for Patients:   StrictlyIdeas.no  Fact Sheet for Healthcare Providers: BankingDealers.co.za  This test is not yet approved or  cleared by the [place] FDA and has been authorized for detection and/or diagnosis of SARS-CoV-2 by FDA under an Emergency Use Authorization (EUA).  This EUA will remain in effect (meaning this test can be used) for the duration of the COVID-19 declaration under Section 564(b)(1) of the Act, 21 U.S.C. section 360bbb-3(b)(1), unless the authorization is terminated or revoked sooner.  Performed at [facility], [place] [address], [place], [place] [address]   Culture, blood (Routine X 2) w Reflex to ID Panel     Status: None (Preliminary result)   Collection Time: 09/07/19  3:09 PM   Specimen: BLOOD RIGHT HAND  Result Value Ref Range  Status   Specimen Description   Final    BLOOD RIGHT HAND Performed at [facility] [address] [place], [place] [address]    Special Requests   Final    BOTTLES DRAWN AEROBIC ONLY Blood Culture results may not be optimal due to an inadequate volume of blood received in culture bottles Performed at [facility] [address] [place], [place] [address]    Culture   Final    NO GROWTH 4 DAYS Performed at [facility], [place]  [address], [place], [place] [address]    Report Status PENDING  Incomplete         Radiology Studies: DG Knee 1-2 Views Left  Result Date: 09/10/2019 CLINICAL DATA:  Left lower extremity cellulitis EXAM: LEFT KNEE - 1-2 VIEW COMPARISON:  None. FINDINGS: Mild degenerative changes in the left knee with joint space narrowing and spurring in all 3 compartments. Small joint effusion. Anterior soft tissue swelling. No acute bony abnormality. Specifically, no fracture, subluxation, or dislocation. IMPRESSION: Soft tissue swelling. Degenerative changes with small joint effusion. No acute bony abnormality. Electronically Signed   By: Rolm Baptise M.D.   On: 09/10/2019 19:33   US Abdomen Limited RUQ  Result Date: 09/11/2019 CLINICAL DATA:  Elevated LFTs. History fatty liver, hypertension, obesity and diabetes. EXAM: ULTRASOUND ABDOMEN LIMITED RIGHT UPPER QUADRANT COMPARISON:  Right upper quadrant abdominal ultrasound-08/11/2015 FINDINGS: Examination is degraded due to patient body habitus, bowel gas and poor sonographic window Gallbladder: About the gallbladder is underdistended and poorly visualized. Given this limitation, there is no gallbladder wall thickening or evidence of echogenic gallstones or biliary sludge. Negative sonographic Murphy sign. Common bile duct: Diameter: Obscured by bowel gas. Liver: There is mild diffuse increased slightly coarsened echogenicity of the hepatic parenchyma. No discrete hepatic lesions. No  intrahepatic biliary duct dilatation. No ascites. Portal vein is patent on color Doppler imaging with normal direction of blood flow towards the liver. Other: None. IMPRESSION: 1. Degraded examination secondary to patient body habitus and bowel gas. 2. Similar findings suggestive of hepatic steatosis. Electronically Signed   By: Sandi Mariscal M.D.   On: 09/11/2019 07:53        Scheduled Meds: . amiodarone  200 mg Oral BID  . apixaban  5 mg Oral BID  . atorvastatin  20 mg Oral Daily  . gabapentin  300 mg Oral BID  . insulin aspart  0-15 Units Subcutaneous TID WC  . insulin aspart  0-5 Units Subcutaneous QHS  . insulin glargine  50 Units Subcutaneous BID  . metoprolol tartrate  25 mg Oral BID  . sodium bicarbonate  650 mg Oral TID   Continuous Infusions: . sodium chloride 75 mL/hr at 09/11/19 0400  . vancomycin Stopped (09/10/19 1844)     LOS: 4 days   Time spent= 35 mins    Takima Encina Arsenio Loader, MD [facility]  If 7PM-7AM, please contact night-coverage  09/11/2019, 11:02 AM

## 2019-09-11 NOTE — Progress Notes (Signed)
  Subjective:  Patient ID: Jeffery [name], male    DOB: 02-06-1961,  MRN: [id number]  Patient seen bedside. Feeling better. States left knee feels better s/p drainage. Thinks the legs are getting less swollen. Objective:   Vitals:   09/11/19 1333 09/11/19 2018  BP: (!) 153/84 (!) 141/80  Pulse: 62 61  Resp: 20 16  Temp: 98.1 F (36.7 C) 98.3 F (36.8 C)  SpO2: 100% 91%   General AA&O x3. Normal mood and affect.  Vascular Dorsalis pedis and posterior tibial pulses 2/4 bilat. Brisk capillary refill to all digits. Pedal hair present.  Neurologic Epicritic sensation grossly intact.  Dermatologic Left leg multiple superficial wounds without warmth erythema, signs of continued acute infection. Right foot wound with intact graft, fibrotic base. Periwound redness without active warmth, likely resolving erythema. Right leg multiple superficial wounds without warmth erythema signs of continued acute infection.  Orthopedic: Motor present distally    Assessment & Plan:  Patient was evaluated and treated and all questions answered.  Cellulitis, multiple BLE wounds -Improving. -Dressings removed today. To be reapplied by RN -xeroform, kerlix, ACE. Continue changes daily -Continue IV abx for cellulitis until fully resolved. -Plan for SNF at d/c once clinically improved  Evelina Bucy, DPM  Accessible via secure chat for questions or concerns.

## 2019-09-11 NOTE — Progress Notes (Signed)
Physical Therapy Treatment Patient Details Name: Jeffery [name] MRN: [id number] DOB: 24-Mar-1961 Today's Date: 09/11/2019    History of Present Illness 59 y.o. male with medical history significant of Obesity, DM2, HTN, P Afib, EtOH use, Charcot's foot, Chronic Pain syndrome, Hx of Cardioversion, diastolic CHF, chronic LE blister and edema s/p excision by podiatry comes with worsening of left lower ext foot pain    PT Comments    Patient is making gradual progress with acute PT. Patient required decreased assistance for bed mobility today but remains reliant on use of bed rail to sit up EOB. Pt is slightly impulsive and eager to mobilize requiring cues throughout transfers and gait with RW for safety. He required bil UE use for power up from elevated EOB and mod assist +2 for rise. Min assist with close chair follow for gait; pt unsafe with proximity to RW initially and cues/assist to manage walker provided during gait. Acute PT will continue to progress pt as able. Continue to recommend SNF level follow up as pt has stairs to enter home.    Follow Up Recommendations  SNF;Supervision/Assistance - 24 hour     Equipment Recommendations  Rolling walker with 5" wheels;3in1 (PT) (wide RW)    Recommendations for Other Services       Precautions / Restrictions Precautions Precautions: Fall Restrictions Weight Bearing Restrictions: No    Mobility  Bed Mobility Overal bed mobility: Needs Assistance Bed Mobility: Supine to Sit     Supine to sit: HOB elevated;Min guard     General bed mobility comments: Min guard for safety as pt is more impulsive to move. pt reliant on use of bed rail and HOB elevated to sit up to EOB. no assist required, extra effort need from pt.  Transfers Overall transfer level: Needs assistance Equipment used: Rolling walker (2 wheeled) Transfers: Sit to/from Stand Sit to Stand: Mod assist;+2 physical assistance;+2 safety/equipment;From elevated surface          General transfer comment: pt impulsive and attempted stand with use of momentum and bil UE on RW. Verbal cues required to improve safety with standing. Bil UE's used for power up from EOB. Mod assist +2 required to rise.   Ambulation/Gait Ambulation/Gait assistance: Min assist;+2 safety/equipment Gait Distance (Feet): 65 Feet Assistive device: Rolling walker (2 wheeled) (wide) Gait Pattern/deviations: Step-through pattern;Decreased step length - right;Decreased step length - left;Decreased stance time - left;Decreased weight shift to left;Antalgic;Wide base of support Gait velocity: decreased   General Gait Details: pt required repeated VC's for safe step pattern and proximity to RW. assist to manage walker safely required. pt Lt knee with some slight flexion in stance phase, no buckling or LOB.    Stairs      Wheelchair Mobility    Modified Rankin (Stroke Patients Only)       Balance Overall balance assessment: Needs assistance Sitting-balance support: Feet supported Sitting balance-Leahy Scale: Fair     Standing balance support: Bilateral upper extremity supported Standing balance-Leahy Scale: Poor         Cognition Arousal/Alertness: Awake/alert Behavior During Therapy: WFL for tasks assessed/performed Overall Cognitive Status: Within Functional Limits for tasks assessed         Exercises      General Comments        Pertinent Vitals/Pain Pain Assessment: Faces Faces Pain Scale: Hurts little more Pain Location: Lt knee Pain Descriptors / Indicators: Aching;Grimacing;Guarding Pain Intervention(s): Limited activity within patient's tolerance;Monitored during session;Repositioned;Ice applied  PT Goals (current goals can now be found in the care plan section) Acute Rehab PT Goals Patient Stated Goal: less pain PT Goal Formulation: With patient Time For Goal Achievement: 09/22/19 Potential to Achieve Goals: Good Progress towards PT  goals: Progressing toward goals    Frequency    Min 3X/week      PT Plan Current plan remains appropriate       AM-PAC PT "6 Clicks" Mobility   Outcome Measure  Help needed turning from your back to your side while in a flat bed without using bedrails?: A Little Help needed moving from lying on your back to sitting on the side of a flat bed without using bedrails?: A Little Help needed moving to and from a bed to a chair (including a wheelchair)?: A Lot Help needed standing up from a chair using your arms (e.g., wheelchair or bedside chair)?: A Lot Help needed to walk in hospital room?: A Lot Help needed climbing 3-5 steps with a railing? : Total 6 Click Score: 13    End of Session Equipment Utilized During Treatment: Gait belt Activity Tolerance: Patient tolerated treatment well Patient left: with call bell/phone within reach;with family/visitor present;in chair;with chair alarm set Nurse Communication: Mobility status PT Visit Diagnosis: Muscle weakness (generalized) (M62.81);Difficulty in walking, not elsewhere classified (R26.2);Pain Pain - Right/Left: Left Pain - part of body: Shoulder;Knee;Ankle and joints of foot;Leg     Time: 1200-1220 PT Time Calculation (min) (ACUTE ONLY): 20 min  Charges:  $Gait Training: 8-22 mins                     Verner Mould, DPT Acute Rehabilitation Services  Office [phone] Pager [phone]  09/11/2019 12:53 PM

## 2019-09-12 LAB — GLUCOSE, CAPILLARY
Glucose-Capillary: 92 mg/dL (ref 70–99)
Glucose-Capillary: 175 mg/dL — ABNORMAL HIGH (ref 70–99)
Glucose-Capillary: 138 mg/dL — ABNORMAL HIGH (ref 70–99)
Glucose-Capillary: 122 mg/dL — ABNORMAL HIGH (ref 70–99)

## 2019-09-12 LAB — CREATININE, SERUM
Creatinine, Ser: 3.86 mg/dL — ABNORMAL HIGH (ref 0.61–1.24)
GFR calc Af Amer: 19 mL/min — ABNORMAL LOW (ref >60)
GFR calc non Af Amer: 16 mL/min — ABNORMAL LOW (ref >60)

## 2019-09-12 LAB — CBC
HCT: 26.7 % — ABNORMAL LOW (ref 39.0–52.0)
Hemoglobin: 8 g/dL — ABNORMAL LOW (ref 13.0–17.0)
MCH: 26.8 pg (ref 26.0–34.0)
MCHC: 30 g/dL (ref 30.0–36.0)
MCV: 89.6 fL (ref 80.0–100.0)
Platelets: 427 10*3/uL — ABNORMAL HIGH (ref 150–400)
RBC: 2.98 MIL/uL — ABNORMAL LOW (ref 4.22–5.81)
RDW: 15.7 % — ABNORMAL HIGH (ref 11.5–15.5)
WBC: 13.2 10*3/uL — ABNORMAL HIGH (ref 4.0–10.5)
nRBC: 0 % (ref 0.0–0.2)

## 2019-09-12 LAB — CULTURE, BLOOD (ROUTINE X 2)
Culture: NO GROWTH
Culture: NO GROWTH
Special Requests: ADEQUATE

## 2019-09-12 LAB — MAGNESIUM: Magnesium: 2.7 mg/dL — ABNORMAL HIGH (ref 1.7–2.4)

## 2019-09-12 LAB — BRAIN NATRIURETIC PEPTIDE: B Natriuretic Peptide: 288.9 pg/mL — ABNORMAL HIGH (ref 0.0–100.0)

## 2019-09-12 MED ORDER — FUROSEMIDE 10 MG/ML IJ SOLN
40.0000 mg | Freq: Three times a day (TID) | INTRAMUSCULAR | Status: AC
  Administered 2019-09-12 – 2019-09-13 (×3): 40 mg via INTRAVENOUS
  Filled 2019-09-12 (×3): qty 4

## 2019-09-12 MED ORDER — LINEZOLID 600 MG PO TABS
600.0000 mg | ORAL_TABLET | Freq: Two times a day (BID) | ORAL | Status: DC
  Administered 2019-09-12 – 2019-09-19 (×16): 600 mg via ORAL
  Filled 2019-09-12 (×17): qty 1

## 2019-09-12 MED ORDER — INSULIN GLARGINE 100 UNIT/ML ~~LOC~~ SOLN
40.0000 [IU] | Freq: Two times a day (BID) | SUBCUTANEOUS | Status: DC
  Administered 2019-09-12 – 2019-09-23 (×22): 40 [IU] via SUBCUTANEOUS
  Filled 2019-09-12 (×23): qty 0.4

## 2019-09-12 NOTE — NC FL2 (Signed)
[place] LEVEL OF CARE SCREENING TOOL     IDENTIFICATION  Patient Name: Jeffery [name] Birthdate: [birth year] Sex: male Admission Date (Current Location): 09/07/2019  [facility] and [place] Number:  Herbalist and Address:  [facility],  [place], [place]      Provider Number: [id number]  Attending Physician Name and Address:  Damita Lack, MD  Relative Name and Phone Number:  Jasman Murri, MEQAST,[phone]    Current Level of Care: Hospital Recommended Level of Care: [place] Prior Approval Number:    Date Approved/Denied:   PASRR Number: [id number]  Discharge Plan: SNF    Current Diagnoses: Patient Active Problem List   Diagnosis Date Noted  . Acute pain of left knee   . Left leg cellulitis 09/07/2019  . Sepsis ([place]) 09/07/2019  . Right foot ulcer, with fat layer exposed ([place]) 08/08/2019  . Cellulitis of right lower extremity 08/08/2019  . Mixed diabetic hyperlipidemia associated with type 2 diabetes mellitus ([place]) 08/08/2019  . Severe sepsis with acute organ dysfunction due to methicillin susceptible Staphylococcus aureus (MSSA) ([place]) 08/08/2019  . AKI (acute kidney injury) ([place]) 08/08/2019  . Sepsis due to methicillin susceptible Staphylococcus aureus (MSSA) with acute renal failure ([place]) 08/08/2019  . Persistent atrial fibrillation ([place])   . Testosterone deficiency 03/16/2018  . Chronic left shoulder pain 03/01/2018  . Body mass index 40.0-44.9, adult ([place]) 03/01/2018  . DDD (degenerative disc disease), lumbar 12/15/2017  . Chronic anticoagulation 10/26/2017  . Dyspnea 10/26/2017  . S/P right rotator cuff repair 04/05/2017  . Achilles tendon contracture, right 01/05/2017  . Closed nondisplaced fracture of distal phalanx of right great toe 01/05/2017  . Paroxysmal atrial fibrillation ([place]) 10/30/2016  . Pain in right ankle and joints of right foot 07/09/2016  . Uncontrolled type 2  diabetes mellitus with diabetic polyneuropathy, without long-term current use of insulin ([place]) 07/09/2016  . 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 Impingement syndrome of right shoulder 07/07/2016  . Morbid obesity ([place]) 06/05/2016  . S/P TKR (total knee replacement), right 03/28/2015  . Knee osteoarthritis 11/12/2014  . Essential hypertension 10/15/2014  . Cellulitis of left lower extremity   . Charcot foot due to diabetes mellitus ([place]) 10/01/2014  . Accelerated hypertension 10/01/2014  . Gangrene left third toe 10/01/2014    Orientation RESPIRATION BLADDER Height & Weight     Self, Time, Situation, Place  Normal Continent Weight: (!) 167.2 kg Height:  6' 2.02" (188 cm)  BEHAVIORAL SYMPTOMS/MOOD NEUROLOGICAL BOWEL NUTRITION STATUS   (none)  (none) Continent Diet (see d/c summary)  AMBULATORY STATUS COMMUNICATION OF NEEDS Skin   Extensive Assist Verbally Normal                       Personal Care Assistance Level of Assistance  Bathing, Feeding, Dressing Bathing Assistance: Limited assistance Feeding assistance: Independent Dressing Assistance: Limited assistance     Functional Limitations Info  Sight, Hearing, Speech Sight Info: Adequate Hearing Info: Adequate Speech Info: Adequate    SPECIAL CARE FACTORS FREQUENCY  PT (By licensed PT)     PT Frequency: 5X/W              Contractures Contractures Info: Not present    Additional Factors Info  Code Status, Allergies Code Status Info: full Allergies Info: metformin           Current Medications (09/12/2019):  This is the  current hospital active medication list Current Facility-Administered Medications  Medication Dose Route Frequency Provider Last Rate Last Admin  . acetaminophen (TYLENOL) tablet 650 mg  650 mg Oral Q6H PRN Amin, Ankit Chirag, MD       Or  . acetaminophen (TYLENOL) suppository 650 mg  650 mg Rectal Q6H PRN Amin, Ankit Chirag,  MD      . amiodarone (PACERONE) tablet 200 mg  200 mg Oral BID Hollace Hayward K, NP   200 mg at 09/12/19 1013  . apixaban (ELIQUIS) tablet 5 mg  5 mg Oral BID Amin, Ankit Chirag, MD   5 mg at 09/12/19 1013  . atorvastatin (LIPITOR) tablet 20 mg  20 mg Oral Daily Amin, Ankit Chirag, MD   20 mg at 09/12/19 1013  . bisacodyl (DULCOLAX) EC tablet 5 mg  5 mg Oral Daily PRN Damita Lack, MD   5 mg at 09/09/19 2235  . gabapentin (NEURONTIN) capsule 300 mg  300 mg Oral BID Amin, Ankit Chirag, MD   300 mg at 09/12/19 1013  . insulin aspart (novoLOG) injection 0-15 Units  0-15 Units Subcutaneous TID WC Amin, Jeanella Flattery, MD   2 Units at 09/11/19 1803  . insulin aspart (novoLOG) injection 0-5 Units  0-5 Units Subcutaneous QHS Amin, Ankit Chirag, MD      . insulin glargine (LANTUS) injection 40 Units  40 Units Subcutaneous BID Amin, Ankit Chirag, MD   40 Units at 09/12/19 1013  . morphine 2 MG/ML injection 2 mg  2 mg Intravenous Q2H PRN Damita Lack, MD   2 mg at 09/08/19 1633  . ondansetron (ZOFRAN) tablet 4 mg  4 mg Oral Q6H PRN Amin, Ankit Chirag, MD       Or  . ondansetron (ZOFRAN) injection 4 mg  4 mg Intravenous Q6H PRN Amin, Ankit Chirag, MD      . oxyCODONE-acetaminophen (PERCOCET/ROXICET) 5-325 MG per tablet 1 tablet  1 tablet Oral Q4H PRN Polly Cobia, RPH   1 tablet at 09/12/19 0859   And  . oxyCODONE (Oxy IR/ROXICODONE) immediate release tablet 5 mg  5 mg Oral Q4H PRN Polly Cobia, RPH   5 mg at 09/12/19 0859  . polyethylene glycol (MIRALAX / GLYCOLAX) packet 17 g  17 g Oral Daily PRN Amin, Ankit Chirag, MD      . senna-docusate (Senokot-S) tablet 1 tablet  1 tablet Oral QHS PRN Amin, Ankit Chirag, MD      . sodium bicarbonate tablet 650 mg  650 mg Oral TID Adele Barthel D, MD   650 mg at 09/12/19 1013  . vancomycin (VANCOREADY) IVPB 2000 mg/400 mL  2,000 mg Intravenous Q48H Thomes Lolling, [facility]   Stopped at 09/10/19 1844     Discharge Medications: Please see  discharge summary for a list of discharge medications.  Relevant Imaging Results:  Relevant Lab Results:   Additional Information Groom, Rainelle Sulewski [place]

## 2019-09-12 NOTE — Progress Notes (Signed)
Occupational Therapy Treatment Patient Details Name: Jeffery [name] MRN: [id number] DOB: 1960-11-23 Today's Date: 09/12/2019    History of present illness 59 y.o. male with medical history significant of Obesity, DM2, HTN, P Afib, EtOH use, Charcot's foot, Chronic Pain syndrome, Hx of Cardioversion, diastolic CHF, chronic LE blister and edema s/p excision by podiatry comes with worsening of left lower ext foot pain   OT comments  Treatment focused on improving bed mobility, functional mobility and participation in ADLs. Patient demonstrated ability to perform supine to sit and standing with supervision. Patient able to stand at sink and perform grooming tasks.  If patient continues to make progress may be able to discharge home with [facility].   Follow Up Recommendations  Home health OT;SNF    Equipment Recommendations  Other (comment)    Recommendations for Other Services      Precautions / Restrictions Precautions Precautions: Fall Restrictions Weight Bearing Restrictions: No RLE Weight Bearing: Weight bearing as tolerated LLE Weight Bearing: Weight bearing as tolerated       Mobility Bed Mobility Overal bed mobility: Modified Independent Bed Mobility: Supine to Sit     Supine to sit: HOB elevated;Supervision        Transfers Overall transfer level: Needs assistance Equipment used: Rolling walker (2 wheeled) Transfers: Sit to/from Stand Sit to Stand: Supervision         General transfer comment: Patient able to perform sit to stand from elevated bed height (to reduce use of knees with standing). Patient ambulated to bathroom and performed grooming standing at the sink approx 10 min. Patient ambulated to recliner and performed transfer with one verbal cue for technique.    Balance Overall balance assessment: Mild deficits observed, not formally tested                                         ADL either performed or assessed with clinical  judgement   ADL   Eating/Feeding: Independent;Sitting   Grooming: Standing;Wash/dry face;Oral care (Patient stood at sink to wash face, shave and brush teeth.)                                       Vision       Perception     Praxis      Cognition Arousal/Alertness: Awake/alert Behavior During Therapy: WFL for tasks assessed/performed Overall Cognitive Status: Within Functional Limits for tasks assessed                                          Exercises     Shoulder Instructions       General Comments      Pertinent Vitals/ Pain       Pain Assessment: Faces Faces Pain Scale: Hurts little more Pain Location: Lt knee Pain Descriptors / Indicators: Aching;Grimacing;Guarding Pain Intervention(s): Monitored during session  Home Living                                          Prior Functioning/Environment              Frequency  Min 2X/week        Progress Toward Goals  OT Goals(current goals can now be found in the care plan section)  Progress towards OT goals: Progressing toward goals     Plan Other (comment) (may be able to discharge home if continues to improve mobility and independence with ADLs)    Co-evaluation                 AM-PAC OT "6 Clicks" Daily Activity     Outcome Measure   Help from another person eating meals?: None Help from another person taking care of personal grooming?: None Help from another person toileting, which includes using toliet, bedpan, or urinal?: A Lot Help from another person bathing (including washing, rinsing, drying)?: A Lot Help from another person to put on and taking off regular upper body clothing?: A Little Help from another person to put on and taking off regular lower body clothing?: A Lot 6 Click Score: 17    End of Session Equipment Utilized During Treatment: Gait belt;Rolling walker  OT Visit Diagnosis: Other abnormalities of gait and  mobility (R26.89);Muscle weakness (generalized) (M62.81);Pain Pain - Right/Left: Left Pain - part of body: Knee   Activity Tolerance Patient tolerated treatment well   Patient Left in chair;with call bell/phone within reach   Nurse Communication Mobility status        Time: 9678-9381 OT Time Calculation (min): 26 min  Charges: OT General Charges $OT Visit: 1 Visit OT Treatments $Self Care/Home Management : 8-22 mins $Therapeutic Activity: 8-22 mins  Sussie Minor, OTR/L [place]  Office [phone] Pager: [place] 09/12/2019, 1:35 PM

## 2019-09-12 NOTE — TOC Initial Note (Signed)
Transition of Care [facility]) - Initial/Assessment Note    Patient Details  Name: Jeffery [name] MRN: [id number] Date of Birth: 19-Oct-1960  Transition of [facility]) CM/SW Contact:    Trish Mage, LCSW Phone Number: 09/12/2019, 10:24 AM  Clinical Narrative:   Patient seen in follow up to PT recommendation of SNF/24 hour supervision. Jeffery [name] lives with mother here in [place], states he has stairs in the home,  And is open to going to rehab.  I explained the process of bed search, adding that he would be committing to 30 day stay.  He quickly shifted his focus to staying in the hospital as long as possible, and then returning home, where he talked about setting up his living quarters on one level so that he would not need to do stairs.  He agreed to allow me to send out a bed search as a back-up plan. Bed search initiated. TOC will continue to follow during the course of hospitalization.               Expected Discharge Plan: [place] Barriers to Discharge: No Barriers Identified   Patient Goals and CMS Choice        Expected Discharge Plan and Services Expected Discharge Plan: [place]                                              Prior Living Arrangements/Services                       Activities of Daily Living Home Assistive Devices/Equipment: CBG Meter ADL Screening (condition at time of admission) Patient's cognitive ability adequate to safely complete daily activities?: Yes Is the patient deaf or have difficulty hearing?: No Does the patient have difficulty seeing, even when wearing glasses/contacts?: No Does the patient have difficulty concentrating, remembering, or making decisions?: No Patient able to express need for assistance with ADLs?: Yes Does the patient have difficulty dressing or bathing?: No Independently performs ADLs?: Yes (appropriate for developmental age) (has chronic swelling of lower  extremities) Does the patient have difficulty walking or climbing stairs?: Yes (secondary to bilateral leg swelling) Weakness of Legs: Both Weakness of Arms/Hands: None  Permission Sought/Granted                  Emotional Assessment              Admission diagnosis:  AKI (acute kidney injury) ([place]) [N17.9] Left leg cellulitis [Q94.765] Diabetic infection of left foot ([place]) [Y65.035, L08.9] Diabetic infection of right foot ([place]) [W65.681, L08.9] Patient Active Problem List   Diagnosis Date Noted  . Acute pain of left knee   . Left leg cellulitis 09/07/2019  . Sepsis ([place]) 09/07/2019  . Right foot ulcer, with fat layer exposed ([place]) 08/08/2019  . Cellulitis of right lower extremity 08/08/2019  . Mixed diabetic hyperlipidemia associated with type 2 diabetes mellitus ([place]) 08/08/2019  . Severe sepsis with acute organ dysfunction due to methicillin susceptible Staphylococcus aureus (MSSA) ([place]) 08/08/2019  . AKI (acute kidney injury) ([place]) 08/08/2019  . Sepsis due to methicillin susceptible Staphylococcus aureus (MSSA) with acute renal failure ([place]) 08/08/2019  . Persistent atrial fibrillation ([place])   . Testosterone deficiency 03/16/2018  . Chronic left shoulder pain 03/01/2018  . Body mass index 40.0-44.9, adult ([place])  03/01/2018  . DDD (degenerative disc disease), lumbar 12/15/2017  . Chronic anticoagulation 10/26/2017  . Dyspnea 10/26/2017  . S/P right rotator cuff repair 04/05/2017  . Achilles tendon contracture, right 01/05/2017  . Closed nondisplaced fracture of distal phalanx of right great toe 01/05/2017  . Paroxysmal atrial fibrillation ([place]) 10/30/2016  . Pain in right ankle and joints of right foot 07/09/2016  . Uncontrolled type 2 diabetes mellitus with diabetic polyneuropathy, without long-term current use of insulin ([place]) 07/09/2016  . 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 Impingement syndrome of  right shoulder 07/07/2016  . Morbid obesity ([place]) 06/05/2016  . S/P TKR (total knee replacement), right 03/28/2015  . Knee osteoarthritis 11/12/2014  . Essential hypertension 10/15/2014  . Cellulitis of left lower extremity   . Charcot foot due to diabetes mellitus ([place]) 10/01/2014  . Accelerated hypertension 10/01/2014  . Gangrene left third toe 10/01/2014   PCP:  [name], MD Pharmacy:   [facility] [address], [place] [facility] [address] [place] [address] Phone: [phone] Fax: [phone]     Social Determinants of Health (SDOH) Interventions    Readmission Risk Interventions No flowsheet data found.

## 2019-09-12 NOTE — Progress Notes (Signed)
Pt has refused CPAP for the night, machine at bedside.  Pt to notify RT if he decides to wear.  RT to monitor and assess as needed.

## 2019-09-12 NOTE — Progress Notes (Signed)
Podiatry Progress Note  Subjective: Jeffery Jeffery [name] is Jeffery [name] 59 y.o. male patient seen at bedside, resting comfortably in no acute distress with nursing present who are in the process of changing his dressings. Reports that he is doing good.  Denies any overnight events or nausea vomiting fever or chills at this time reports that his legs are feeling better and that he can lift them up without much pain and reports that his left knee is also doing better.  No other issues noted.   Patient Active Problem List   Diagnosis Date Noted  . Acute pain of left knee   . Left leg cellulitis 09/07/2019  . Sepsis ([place]) 09/07/2019  . Right foot ulcer, with fat layer exposed ([place]) 08/08/2019  . Cellulitis of right lower extremity 08/08/2019  . Mixed diabetic hyperlipidemia associated with type 2 diabetes mellitus ([place]) 08/08/2019  . Severe sepsis with acute organ dysfunction due to methicillin susceptible Staphylococcus aureus (MSSA) ([place]) 08/08/2019  . AKI (acute kidney injury) ([place]) 08/08/2019  . Sepsis due to methicillin susceptible Staphylococcus aureus (MSSA) with acute renal failure ([place]) 08/08/2019  . Persistent atrial fibrillation ([place])   . Testosterone deficiency 03/16/2018  . Chronic left shoulder pain 03/01/2018  . Body mass index 40.0-44.9, adult ([place]) 03/01/2018  . DDD (degenerative disc disease), lumbar 12/15/2017  . Chronic anticoagulation 10/26/2017  . Dyspnea 10/26/2017  . S/P right rotator cuff repair 04/05/2017  . Achilles tendon contracture, right 01/05/2017  . Closed nondisplaced fracture of distal phalanx of right great toe 01/05/2017  . Paroxysmal atrial fibrillation ([place]) 10/30/2016  . Pain in right ankle and joints of right foot 07/09/2016  . Uncontrolled type 2 diabetes mellitus with diabetic polyneuropathy, without long-term current use of insulin ([place]) 07/09/2016  . 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 Impingement  syndrome of right shoulder 07/07/2016  . Morbid obesity ([place]) 06/05/2016  . S/P TKR (total knee replacement), right 03/28/2015  . Knee osteoarthritis 11/12/2014  . Essential hypertension 10/15/2014  . Cellulitis of left lower extremity   . Charcot foot due to diabetes mellitus ([place]) 10/01/2014  . Accelerated hypertension 10/01/2014  . Gangrene left third toe 10/01/2014     Current Facility-Administered Medications:  .  acetaminophen (TYLENOL) tablet 650 mg, 650 mg, Oral, Q6H PRN **OR** acetaminophen (TYLENOL) suppository 650 mg, 650 mg, Rectal, Q6H PRN, Jeffery Jeffery [name], Jeffery Jeffery [name], Jeffery Jeffery [name] .  amiodarone (PACERONE) tablet 200 mg, 200 mg, Oral, BID, Jeffery Jeffery [name], Jeffery Jeffery [name], Jeffery Jeffery [name], 200 mg at 09/12/19 1013 .  apixaban (ELIQUIS) tablet 5 mg, 5 mg, Oral, BID, Jeffery Jeffery [name], Jeffery Jeffery [name], Jeffery Jeffery [name], 5 mg at 09/12/19 1013 .  atorvastatin (LIPITOR) tablet 20 mg, 20 mg, Oral, Daily, Jeffery Jeffery [name], Jeffery Jeffery [name], Jeffery Jeffery [name], 20 mg at 09/12/19 1013 .  bisacodyl (DULCOLAX) EC tablet 5 mg, 5 mg, Oral, Daily PRN, Jeffery Jeffery [name], Jeffery Jeffery [name], Jeffery Jeffery [name], 5 mg at 09/09/19 2235 .  furosemide (LASIX) injection 40 mg, 40 mg, Intravenous, Q8H, Jeffery Jeffery [name], Jeffery Jeffery [name], Jeffery Jeffery [name], 40 mg at 09/12/19 1306 .  gabapentin (NEURONTIN) capsule 300 mg, 300 mg, Oral, BID, Jeffery Jeffery [name], Jeffery Jeffery [name], Jeffery Jeffery [name], 300 mg at 09/12/19 1013 .  insulin aspart (novoLOG) injection 0-15 Units, 0-15 Units, Subcutaneous, TID WC, Jeffery Jeffery [name], Jeffery Jeffery [name], Jeffery Jeffery [name], 2 Units at 09/12/19 1704 .  insulin aspart (novoLOG) injection 0-5 Units, 0-5 Units, Subcutaneous, QHS, Jeffery Jeffery [name], Jeffery Jeffery [name], Jeffery Jeffery [name] .  insulin glargine (LANTUS) injection 40 Units, 40 Units, Subcutaneous, BID, Jeffery Jeffery [name], Jeffery Jeffery [name], Jeffery Jeffery [name], 40 Units at 09/12/19 1013 .  linezolid (ZYVOX) tablet 600 mg,  600 mg, Oral, Q12H, Jeffery Jeffery [name], Jeffery Jeffery [name], Jeffery Jeffery [name], 600 mg at 09/12/19 1326 .  morphine 2 MG/ML injection 2 mg, 2 mg, Intravenous, Q2H PRN, Jeffery Jeffery [name], Jeffery Jeffery [name], Jeffery Jeffery [name], 2 mg at 09/08/19 1633 .  ondansetron (ZOFRAN) tablet 4 mg, 4 mg, Oral, Q6H PRN **OR** ondansetron (ZOFRAN) injection 4 mg, 4 mg, Intravenous, Q6H  PRN, Jeffery Jeffery [name], Jeffery Jeffery [name], Jeffery Jeffery [name] .  oxyCODONE-acetaminophen (PERCOCET/ROXICET) 5-325 MG per tablet 1 tablet, 1 tablet, Oral, Q4H PRN, 1 tablet at 09/12/19 1704 **AND** oxyCODONE (Oxy IR/ROXICODONE) immediate release tablet 5 mg, 5 mg, Oral, Q4H PRN, Jeffery Jeffery [name], Jeffery Jeffery [name], Jeffery Jeffery [name], 5 mg at 09/12/19 1704 .  polyethylene glycol (MIRALAX / GLYCOLAX) packet 17 g, 17 g, Oral, Daily PRN, Jeffery Jeffery [name], Jeffery Jeffery [name], Jeffery Jeffery [name] .  senna-docusate (Senokot-S) tablet 1 tablet, 1 tablet, Oral, QHS PRN, Jeffery Jeffery [name], Jeffery Jeffery [name], Jeffery Jeffery [name] .  sodium bicarbonate tablet 650 mg, 650 mg, Oral, TID, Jeffery Jeffery [name], Jeffery Jeffery [name], 650 mg at 09/12/19 1704  Allergies  Allergen Reactions  . Metformin And Related Other (See Comments)    GI upset     Objective: Today's Vitals   09/12/19 1356 09/12/19 1406 09/12/19 1704 09/12/19 2006  BP: (!) 155/80   (!) 141/79  Pulse: (!) 59   61  Resp: 12   18  Temp:    97.6 F (36.4 C)  TempSrc:      SpO2: 98%   100%  Weight:      Height:      PainSc:  Asleep 8      General: No acute distress Bilateral lower extremity exam: DP and PT pedal pulses palpable with displacement of edema bilateral.  Gross sensation present via light touch.  There are multiple superficial wounds and ruptured blisters noted to the lower legs at the right foot graft site wound base is fibrotic with overlying graft incorporation, no active drainage, no malodor, localized erythema without any warmth or acute signs of infection.  No pain to palpation to calf muscles bilateral.  Assessment and Plan:  Problem List Items Addressed This Visit      Genitourinary   AKI (acute kidney injury) ([place]) - Primary   Relevant Orders   US RENAL (Completed)    Other Visit Diagnoses    Diabetic infection of right foot ([place])       Relevant Medications   vancomycin (VANCOREADY) IVPB 2000 mg/400 mL (Completed)   atorvastatin (LIPITOR) tablet 20 mg   insulin aspart (novoLOG) injection 0-15 Units   insulin aspart (novoLOG) injection 0-5 Units   insulin  glargine (LANTUS) injection 40 Units   linezolid (ZYVOX) tablet 600 mg   Diabetic infection of left foot (H[facility])       Relevant Medications   vancomycin (VANCOREADY) IVPB 2000 mg/400 mL (Completed)   atorvastatin (LIPITOR) tablet 20 mg   insulin aspart (novoLOG) injection 0-15 Units   insulin aspart (novoLOG) injection 0-5 Units   insulin glargine (LANTUS) injection 40 Units   linezolid (ZYVOX) tablet 600 mg   LFT elevation       Relevant Orders   US Abdomen Limited RUQ (Completed)   Knee pain, left       Relevant Orders   DG Knee 1-2 Views Left (Completed)      -Patient seen and evaluated at bedside -Reapplied Xeroform and dry dressing continue with nursing to assist with daily dressing changes -Continue with rest and elevation to assist with pain and edema control  -Continue with medical management for edema and cellulitis which appears to be much improved -Patient  is stable per podiatry point of view with no anticipated procedures needed at this time -Anticipated discharge plan of care: To SNF for continued wound care with follow-up in office with Dr. [place] Jeffery [name], DPM [facility] [phone] office [phone] cell

## 2019-09-12 NOTE — Progress Notes (Signed)
PROGRESS NOTE    Jeffery [name]  LZJ:[id number] DOB: 09/10/1960 DOA: 09/07/2019 PCP: [name], MD   Brief Narrative:  59 y.o. male with medical history significant of Obesity, DM2, HTN, P Afib, EtOH use, Charcot's foot, Chronic Pain syndrome, Hx of Cardioversion, diastolic CHF, chronic LE blister and edema s/p excision by podiatry comes with worsening of left lower ext foot pain.  Admitted for sepsis secondary to purulent left lower extremity cellulitis.  Hospital course complicated by AKI.  MRI left leg suggested extensive cellulitis with moderate left knee effusion.  Orthopedic consulted who aspirated left knee fluid suggestive of inflammatory fluid.    Assessment & Plan:   Principal Problem:   Cellulitis of left lower extremity Active Problems:   Charcot foot due to diabetes mellitus ([place])   Essential hypertension   Uncontrolled type 2 diabetes mellitus with diabetic polyneuropathy, without long-term current use of insulin (H[facility])   Paroxysmal atrial fibrillation ([facility])   Chronic anticoagulation   AKI (acute kidney injury) ([place])   Left leg cellulitis   Sepsis ([place])   Acute pain of left knee  Sepsis secondary to left Lower extremity Cellulitis with some purulent; POA B/L lower extremity chronic skin changes with blister -Sepsis physiology improving. -UA-possible mild UTI?,  Blood cultures negative -IV vancomycin-day 4.  Cellulitis order set used -Pain control, bowel regimen -Ultrasound ABI-difficult to obtain due to blisters -MRI-left lower extremity cellulitis but no evidence of abscess/osteomyelitis -Daily dressing changes-Xeroform, Kerlix, Ace.  Left shoulder and left ankle pain, improved Acute mild gout flare Moderate left knee effusion-inflammatory versus infectious -X-ray showed chronic changes.  MRI showed soft tissue inflammation concerning for cellulitis with moderate left knee effusion -Seen by Ortho status post knee aspiration 6/14 suggestive of  inflammatory fluid.  Received colchicine for gout, doing better. -Pain control -Continue work with PT/OT-SNF  Acute Kidney Injury on CKD Stage IIIa, worsening -Baseline creatinine 1.6, admission creatinine 2.4.  Today's 3.86.  Periodic bladder scan.  Foley if necessary. Some suggestion of fluid overload therefore will give Lasix 40 mg IV every 8 hours X3 doses -Renal ultrasound-negative  Sinus bradycardia -Continue amiodarone.  Discontinue metoprolol for now.  Essential Hypertension  -As needed hydralazine if necessary  DM2 with neuropathy , controlled -Insulin sliding scale and Accu-Chek.  Lantus 50 units twice daily -Gabapentin -Diabetic diet  Paroxysmal A Fib, s/p Cardioversion -On  amiodarone and Eliquis  PT-recommending SNF  DVT prophylaxis: Eliquis Code Status: Full code Family Communication: None  Status is: Inpatient  Remains inpatient appropriate because:IV treatments appropriate due to intensity of illness or inability to take PO   Dispo: The patient is from: Home              Anticipated d/c is to: SNF              Anticipated d/c date is: 2 days              Patient currently is not medically stable to d/c.    Significant amount of renal dysfunction requiring in hospital stay in the meantime getting IV antibiotics for his cellulitis  Subjective: Doing better, yesterday was able to walk in the hallway with the help of physical therapy  Review of Systems Otherwise negative except as per HPI, including: General: Denies fever, chills, night sweats or unintended weight loss. Resp: Denies cough, wheezing, shortness of breath. Cardiac: Denies chest pain, palpitations, orthopnea, paroxysmal nocturnal dyspnea. GI: Denies abdominal pain, nausea, vomiting, diarrhea or constipation GU: Denies dysuria, frequency, hesitancy  or incontinence MS: Denies muscle aches, joint pain or swelling Neuro: Denies headache, neurologic deficits (focal weakness, numbness,  tingling), abnormal gait Psych: Denies anxiety, depression, SI/HI/AVH Skin: Denies new rashes or lesions ID: Denies sick contacts, exotic exposures, travel   Examination: Constitutional: Not in acute distress Respiratory: Clear to auscultation bilaterally Cardiovascular: Normal sinus rhythm, no rubs Abdomen: Nontender nondistended good bowel sounds Musculoskeletal: No edema noted Skin: Bilateral lower extremity erythema, dressing in place. Neurologic: CN 2-12 grossly intact.  And nonfocal Psychiatric: Normal judgment and insight. Alert and oriented x 3. Normal mood.   Objective: Vitals:   09/11/19 1333 09/11/19 2018 09/12/19 0433 09/12/19 0500  BP: (!) 153/84 (!) 141/80 (!) 149/78   Pulse: 62 61 60   Resp: 20 16 18    Temp: 98.1 F (36.7 C) 98.3 F (36.8 C) 97.7 F (36.5 C)   TempSrc: Oral Oral Oral   SpO2: 100% 91% 98%   Weight:    (!) 167.2 kg  Height:        Intake/Output Summary (Last 24 hours) at 09/12/2019 1131 Last data filed at 09/12/2019 1116 Gross per 24 hour  Intake 960 ml  Output 2275 ml  Net -1315 ml   Filed Weights   09/10/19 0516 09/11/19 0459 09/12/19 0500  Weight: (!) 166.7 kg (!) 166.3 kg (!) 167.2 kg     Data Reviewed:   CBC: Recent Labs  Lab 09/07/19 1221 09/07/19 1221 09/08/19 0344 09/09/19 0620 09/10/19 0652 09/11/19 0558 09/12/19 0358  WBC 19.4*   < > 16.5* 18.5* 14.0* 14.0* 13.2*  NEUTROABS 15.5*  --   --   --   --   --   --   HGB 10.1*   < > 8.5* 9.7* 9.2* 8.7* 8.0*  HCT 33.9*   < > 27.5* 33.3* 30.3* 29.4* 26.7*  MCV 91.6   < > 90.2 92.5 89.4 91.6 89.6  PLT 506*   < > 406* 513* 479* 474* 427*   < > = values in this interval not displayed.   Basic Metabolic Panel: Recent Labs  Lab 09/07/19 1221 09/07/19 1221 09/08/19 0344 09/09/19 0620 09/10/19 0652 09/11/19 0558 09/12/19 0358  NA 137  --  133* 133* 135 136  --   K 4.8  --  4.6 4.8 4.9 4.6  --   CL 102  --  102 102 103 104  --   CO2 24  --  20* 19* 20* 20*  --     GLUCOSE 195*  --  124* 96 84 113*  --   BUN 42*  --  47* 67* 71* 72*  --   CREATININE 2.48*   < > 2.37* 3.74* 3.60* 3.67* 3.86*  CALCIUM 8.3*  --  8.0* 8.5* 8.4* 8.1*  --   MG  --   --  2.4 2.6* 2.6* 2.7* 2.7*  PHOS  --   --   --   --   --  6.9*  --    < > = values in this interval not displayed.   GFR: Estimated Creatinine Clearance: 34.7 mL/min (A) (by C-G formula based on SCr of 3.86 mg/dL (H)). Liver Function Tests: Recent Labs  Lab 09/07/19 1221 09/08/19 0344 09/09/19 0620 09/10/19 0652 09/11/19 0558  AST 24 21 52* 91*  --   ALT 31 23 40 66*  --   ALKPHOS 84 64 78 77  --   BILITOT 0.9 0.8 0.8 0.6  --   PROT 7.6 6.2* 7.2 6.8  --  ALBUMIN 2.3* 1.9* 2.1* 2.0* 1.9*   No results for input(s): LIPASE, AMYLASE in the last 168 hours. No results for input(s): AMMONIA in the last 168 hours. Coagulation Profile: No results for input(s): INR, PROTIME in the last 168 hours. Cardiac Enzymes: Recent Labs  Lab 09/07/19 1221  CKTOTAL 322   BNP (last 3 results) No results for input(s): PROBNP in the last 8760 hours. HbA1C: No results for input(s): HGBA1C in the last 72 hours. CBG: Recent Labs  Lab 09/11/19 0814 09/11/19 1127 09/11/19 1709 09/11/19 2114 09/12/19 0747  GLUCAP 102* 130* 140* 140* 92   Lipid Profile: No results for input(s): CHOL, HDL, LDLCALC, TRIG, CHOLHDL, LDLDIRECT in the last 72 hours. Thyroid Function Tests: No results for input(s): TSH, T4TOTAL, FREET4, T3FREE, THYROIDAB in the last 72 hours. Anemia Panel: No results for input(s): VITAMINB12, FOLATE, FERRITIN, TIBC, IRON, RETICCTPCT in the last 72 hours. Sepsis Labs: Recent Labs  Lab 09/07/19 1221  LATICACIDVEN 0.8    Recent Results (from the past 240 hour(s))  Culture, blood (Routine X 2) w Reflex to ID Panel     Status: None   Collection Time: 09/07/19 12:25 PM   Specimen: BLOOD  Result Value Ref Range Status   Specimen Description   Final    BLOOD RIGHT ANTECUBITAL Performed at [facility] [address], [place], [place] [address]    Special Requests   Final    BOTTLES DRAWN AEROBIC AND ANAEROBIC Blood Culture adequate volume Performed at [facility] [address], [place], [place] [address]    Culture   Final    NO GROWTH 5 DAYS Performed at [facility], [place] [address] [place], [place] [address]    Report Status 09/12/2019 FINAL  Final  SARS Coronavirus 2 by RT PCR (hospital order, performed in [facility] hospital lab) Nasopharyngeal Nasopharyngeal Swab     Status: None   Collection Time: 09/07/19  2:37 PM   Specimen: Nasopharyngeal Swab  Result Value Ref Range Status   SARS Coronavirus 2 NEGATIVE NEGATIVE Final    Comment: (NOTE) SARS-CoV-2 target nucleic acids are NOT DETECTED.  The SARS-CoV-2 RNA is generally detectable in upper and lower respiratory specimens during the acute phase of infection. The lowest concentration of SARS-CoV-2 viral copies this assay can detect is 250 copies / mL. A negative result does not preclude SARS-CoV-2 infection and should not be used as the sole basis for treatment or other patient management decisions.  A negative result may occur with improper specimen collection / handling, submission of specimen other than nasopharyngeal swab, presence of viral mutation(s) within the areas targeted by this assay, and inadequate number of viral copies (<250 copies / mL). A negative result must be combined with clinical observations, patient history, and epidemiological information.  Fact Sheet for Patients:   StrictlyIdeas.no  Fact Sheet for Healthcare Providers: BankingDealers.co.za  This test is not yet approved or  cleared by the [place] FDA and has been authorized for detection and/or diagnosis of SARS-CoV-2 by FDA under an Emergency Use Authorization (EUA).  This EUA will remain in effect (meaning this test can be used) for  the duration of the COVID-19 declaration under Section 564(b)(1) of the Act, 21 U.S.C. section 360bbb-3(b)(1), unless the authorization is terminated or revoked sooner.  Performed at [facility], [place] [address] [place], [place] [address]   Culture, blood (Routine X 2) w Reflex to ID Panel     Status:  None   Collection Time: 09/07/19  3:09 PM   Specimen: BLOOD RIGHT HAND  Result Value Ref Range Status   Specimen Description   Final    BLOOD RIGHT HAND Performed at [facility] [address] [place], [place] [address]    Special Requests   Final    BOTTLES DRAWN AEROBIC ONLY Blood Culture results may not be optimal due to an inadequate volume of blood received in culture bottles Performed at [facility] [address] [place], [place] [address]    Culture   Final    NO GROWTH 5 DAYS Performed at [facility], [place] [address], [place], [place] [address]    Report Status 09/12/2019 FINAL  Final  Body fluid culture     Status: None (Preliminary result)   Collection Time: 09/11/19  1:47 PM   Specimen: Synovium; Body Fluid  Result Value Ref Range Status   Specimen Description   Final    SYNOVIAL KNEE LEFT Performed at [facility] [address], [place], [place] [address]    Special Requests NONE  Final   Gram Stain   Final    ABUNDANT WBC PRESENT, PREDOMINANTLY PMN NO ORGANISMS SEEN    Culture   Final    NO GROWTH < 12 HOURS Performed at [facility] [address], [place], [place] [address]    Report Status PENDING  Incomplete         Radiology Studies: DG Knee 1-2 Views Left  Result Date: 09/10/2019 CLINICAL DATA:  Left lower extremity cellulitis EXAM: LEFT KNEE - 1-2 VIEW COMPARISON:  None. FINDINGS: Mild degenerative changes in the left knee with joint space narrowing and spurring in all 3 compartments. Small joint effusion. Anterior soft tissue swelling. No acute bony  abnormality. Specifically, no fracture, subluxation, or dislocation. IMPRESSION: Soft tissue swelling. Degenerative changes with small joint effusion. No acute bony abnormality. Electronically Signed   By: Rolm Baptise M.D.   On: 09/10/2019 19:33   US Abdomen Limited RUQ  Result Date: 09/11/2019 CLINICAL DATA:  Elevated LFTs. History fatty liver, hypertension, obesity and diabetes. EXAM: ULTRASOUND ABDOMEN LIMITED RIGHT UPPER QUADRANT COMPARISON:  Right upper quadrant abdominal ultrasound-08/11/2015 FINDINGS: Examination is degraded due to patient body habitus, bowel gas and poor sonographic window Gallbladder: About the gallbladder is underdistended and poorly visualized. Given this limitation, there is no gallbladder wall thickening or evidence of echogenic gallstones or biliary sludge. Negative sonographic Murphy sign. Common bile duct: Diameter: Obscured by bowel gas. Liver: There is mild diffuse increased slightly coarsened echogenicity of the hepatic parenchyma. No discrete hepatic lesions. No intrahepatic biliary duct dilatation. No ascites. Portal vein is patent on color Doppler imaging with normal direction of blood flow towards the liver. Other: None. IMPRESSION: 1. Degraded examination secondary to patient body habitus and bowel gas. 2. Similar findings suggestive of hepatic steatosis. Electronically Signed   By: Sandi Mariscal M.D.   On: 09/11/2019 07:53        Scheduled Meds: . amiodarone  200 mg Oral BID  . apixaban  5 mg Oral BID  . atorvastatin  20 mg Oral Daily  . furosemide  40 mg Intravenous Q8H  . gabapentin  300 mg Oral BID  . insulin aspart  0-15 Units Subcutaneous TID WC  . insulin aspart  0-5 Units Subcutaneous QHS  . insulin glargine  40 Units Subcutaneous BID  . sodium bicarbonate  650 mg Oral TID  Continuous Infusions: . vancomycin Stopped (09/10/19 1844)     LOS: 5 days   Time spent= 35 mins    Khayree Delellis Arsenio Loader, MD [facility]  If 7PM-7AM, please  contact night-coverage  09/12/2019, 11:31 AM

## 2019-09-13 LAB — CBC
HCT: 31.9 % — ABNORMAL LOW (ref 39.0–52.0)
Hemoglobin: 9.4 g/dL — ABNORMAL LOW (ref 13.0–17.0)
MCH: 26.9 pg (ref 26.0–34.0)
MCHC: 29.5 g/dL — ABNORMAL LOW (ref 30.0–36.0)
MCV: 91.1 fL (ref 80.0–100.0)
Platelets: 484 10*3/uL — ABNORMAL HIGH (ref 150–400)
RBC: 3.5 MIL/uL — ABNORMAL LOW (ref 4.22–5.81)
RDW: 15.9 % — ABNORMAL HIGH (ref 11.5–15.5)
WBC: 14.5 10*3/uL — ABNORMAL HIGH (ref 4.0–10.5)
nRBC: 0 % (ref 0.0–0.2)

## 2019-09-13 LAB — GLUCOSE, CAPILLARY
Glucose-Capillary: 125 mg/dL — ABNORMAL HIGH (ref 70–99)
Glucose-Capillary: 139 mg/dL — ABNORMAL HIGH (ref 70–99)
Glucose-Capillary: 120 mg/dL — ABNORMAL HIGH (ref 70–99)
Glucose-Capillary: 171 mg/dL — ABNORMAL HIGH (ref 70–99)

## 2019-09-13 LAB — BASIC METABOLIC PANEL
Anion gap: 12 (ref 5–15)
BUN: 66 mg/dL — ABNORMAL HIGH (ref 6–20)
CO2: 21 mmol/L — ABNORMAL LOW (ref 22–32)
Calcium: 8.2 mg/dL — ABNORMAL LOW (ref 8.9–10.3)
Chloride: 104 mmol/L (ref 98–111)
Creatinine, Ser: 3.36 mg/dL — ABNORMAL HIGH (ref 0.61–1.24)
GFR calc Af Amer: 22 mL/min — ABNORMAL LOW (ref >60)
GFR calc non Af Amer: 19 mL/min — ABNORMAL LOW (ref >60)
Glucose, Bld: 123 mg/dL — ABNORMAL HIGH (ref 70–99)
Potassium: 4.6 mmol/L (ref 3.5–5.1)
Sodium: 137 mmol/L (ref 135–145)

## 2019-09-13 LAB — MAGNESIUM: Magnesium: 2.6 mg/dL — ABNORMAL HIGH (ref 1.7–2.4)

## 2019-09-13 MED ORDER — FUROSEMIDE 10 MG/ML IJ SOLN
40.0000 mg | Freq: Three times a day (TID) | INTRAMUSCULAR | Status: AC
  Administered 2019-09-13 – 2019-09-14 (×3): 40 mg via INTRAVENOUS
  Filled 2019-09-13 (×3): qty 4

## 2019-09-13 NOTE — Progress Notes (Signed)
PROGRESS NOTE    Jeffery [name]  XHB:[id number] DOB: [birth year] DOA: 09/07/2019 PCP: [name], MD   Brief Narrative:  59 y.o. male with medical history significant of Obesity, DM2, HTN, P Afib, EtOH use, Charcot's foot, Chronic Pain syndrome, Hx of Cardioversion, diastolic CHF, chronic LE blister and edema s/p excision by podiatry comes with worsening of left lower ext foot pain.  Admitted for sepsis secondary to purulent left lower extremity cellulitis.  Hospital course complicated by AKI.  MRI left leg suggested extensive cellulitis with moderate left knee effusion.  Orthopedic consulted who aspirated left knee fluid suggestive of inflammatory fluid.    Assessment & Plan:   Principal Problem:   Cellulitis of left lower extremity Active Problems:   Charcot foot due to diabetes mellitus ([place])   Essential hypertension   Uncontrolled type 2 diabetes mellitus with diabetic polyneuropathy, without long-term current use of insulin (HCC)   Paroxysmal atrial fibrillation (HCC)   Chronic anticoagulation   AKI (acute kidney injury) ([place])   Left leg cellulitis   Sepsis ([place])   Acute pain of left knee  Sepsis secondary to left Lower extremity Cellulitis with some purulent; POA B/L lower extremity chronic skin changes with blister -Sepsis physiology improved -UA-possible mild UTI?,  Blood cultures negative -Initially on IV vancomycin now switched to linezolid.  We will plan on continuing p.o. for at least 1-2 weeks until his cellulitis has resolved.  We will follow-up outpatient podiatry. -Pain control, bowel regimen -Ultrasound ABI-difficult to obtain due to blisters -MRI-left lower extremity cellulitis but no evidence of abscess/osteomyelitis -Daily dressing changes-Xeroform, Kerlix, Ace.  Left shoulder and left ankle pain, improved Acute mild gout flare Moderate left knee effusion-inflammatory versus infectious -X-ray showed chronic changes.  MRI showed soft tissue  inflammation concerning for cellulitis with moderate left knee effusion -Seen by Ortho status post knee aspiration 6/14 suggestive of inflammatory fluid.  Received colchicine for gout, doing better. -Pain control -Continue work with PT/OT-SNF.  Patient is debating about this.  Will work with POC  Acute Kidney Injury on CKD Stage IIIa, worsening -Baseline creatinine 1.6, admission creatinine 2.4.  Today it is 3.3 after diuresis overnight.  We will continue 3 more days of IV Lasix today.  Closely monitor. -Renal ultrasound-negative  Sinus bradycardia -Continue amiodarone.  Discontinue metoprolol for now.  Essential Hypertension  -As needed hydralazine if necessary  DM2 with neuropathy , controlled -Insulin sliding scale and Accu-Chek.  Lantus 50 units twice daily -Gabapentin -Diabetic diet  Paroxysmal A Fib, s/p Cardioversion -On  amiodarone and Eliquis  PT-recommending SNF  DVT prophylaxis: Eliquis Code Status: Full code Family Communication: None  Status is: Inpatient  Remains inpatient appropriate because:IV treatments appropriate due to intensity of illness or inability to take PO   Dispo: The patient is from: Home              Anticipated d/c is to: SNF              Anticipated d/c date is: 2 days              Patient currently is not medically stable to d/c.    Maintain hospital stay for IV diuretics.  Once his renal function has improved, will transition him either home with home health or SNF.  In the meantime patient is going to work with [facility] team to determine his disposition planning.  Subjective: Feels better no complaints.  Occasionally still requires help from nursing staff and with mobility.  Review  of Systems Otherwise negative except as per HPI, including: General: Denies fever, chills, night sweats or unintended weight loss. Resp: Denies cough, wheezing, shortness of breath. Cardiac: Denies chest pain, palpitations, orthopnea, paroxysmal nocturnal  dyspnea. GI: Denies abdominal pain, nausea, vomiting, diarrhea or constipation GU: Denies dysuria, frequency, hesitancy or incontinence MS: Denies muscle aches, joint pain or swelling Neuro: Denies headache, neurologic deficits (focal weakness, numbness, tingling), abnormal gait Psych: Denies anxiety, depression, SI/HI/AVH Skin: Denies new rashes or lesions ID: Denies sick contacts, exotic exposures, travel  Examination: Constitutional: Not in acute distress Respiratory: Clear to auscultation bilaterally Cardiovascular: Normal sinus rhythm, no rubs Abdomen: Nontender nondistended good bowel sounds Musculoskeletal: No edema noted Skin: Bilateral lower extremity erythema with dressing in place Neurologic: CN 2-12 grossly intact.  And nonfocal Psychiatric: Normal judgment and insight. Alert and oriented x 3. Normal mood.  Objective: Vitals:   09/12/19 2006 09/13/19 0433 09/13/19 0500 09/13/19 1023  BP: (!) 141/79 (!) 158/70  118/78  Pulse: 61 61  62  Resp: 18 17    Temp: 97.6 F (36.4 C) 97.6 F (36.4 C)    TempSrc: Oral Oral    SpO2: 100% 100%    Weight:   (!) 165.8 kg   Height:        Intake/Output Summary (Last 24 hours) at 09/13/2019 1052 Last data filed at 09/13/2019 0917 Gross per 24 hour  Intake 2020 ml  Output 3475 ml  Net -1455 ml   Filed Weights   09/11/19 0459 09/12/19 0500 09/13/19 0500  Weight: (!) 166.3 kg (!) 167.2 kg (!) 165.8 kg     Data Reviewed:   CBC: Recent Labs  Lab 09/07/19 1221 09/08/19 0344 09/09/19 0620 09/10/19 0652 09/11/19 0558 09/12/19 0358 09/13/19 0339  WBC 19.4*   < > 18.5* 14.0* 14.0* 13.2* 14.5*  NEUTROABS 15.5*  --   --   --   --   --   --   HGB 10.1*   < > 9.7* 9.2* 8.7* 8.0* 9.4*  HCT 33.9*   < > 33.3* 30.3* 29.4* 26.7* 31.9*  MCV 91.6   < > 92.5 89.4 91.6 89.6 91.1  PLT 506*   < > 513* 479* 474* 427* 484*   < > = values in this interval not displayed.   Basic Metabolic Panel: Recent Labs  Lab 09/08/19 0344  09/08/19 0344 09/09/19 0620 09/10/19 0652 09/11/19 0558 09/12/19 0358 09/13/19 0339  NA 133*  --  133* 135 136  --  137  K 4.6  --  4.8 4.9 4.6  --  4.6  CL 102  --  102 103 104  --  104  CO2 20*  --  19* 20* 20*  --  21*  GLUCOSE 124*  --  96 84 113*  --  123*  BUN 47*  --  67* 71* 72*  --  66*  CREATININE 2.37*   < > 3.74* 3.60* 3.67* 3.86* 3.36*  CALCIUM 8.0*  --  8.5* 8.4* 8.1*  --  8.2*  MG 2.4   < > 2.6* 2.6* 2.7* 2.7* 2.6*  PHOS  --   --   --   --  6.9*  --   --    < > = values in this interval not displayed.   GFR: Estimated Creatinine Clearance: 39.7 mL/min (A) (by C-G formula based on SCr of 3.36 mg/dL (H)). Liver Function Tests: Recent Labs  Lab 09/07/19 1221 09/08/19 1914 09/09/19 7829 09/10/19 5621 09/11/19 3086  AST 24 21 52* 91*  --   ALT 31 23 40 66*  --   ALKPHOS 84 64 78 77  --   BILITOT 0.9 0.8 0.8 0.6  --   PROT 7.6 6.2* 7.2 6.8  --   ALBUMIN 2.3* 1.9* 2.1* 2.0* 1.9*   No results for input(s): LIPASE, AMYLASE in the last 168 hours. No results for input(s): AMMONIA in the last 168 hours. Coagulation Profile: No results for input(s): INR, PROTIME in the last 168 hours. Cardiac Enzymes: Recent Labs  Lab 09/07/19 1221  CKTOTAL 322   BNP (last 3 results) No results for input(s): PROBNP in the last 8760 hours. HbA1C: No results for input(s): HGBA1C in the last 72 hours. CBG: Recent Labs  Lab 09/12/19 0747 09/12/19 1138 09/12/19 1608 09/12/19 2129 09/13/19 0754  GLUCAP 92 175* 138* 122* 125*   Lipid Profile: No results for input(s): CHOL, HDL, LDLCALC, TRIG, CHOLHDL, LDLDIRECT in the last 72 hours. Thyroid Function Tests: No results for input(s): TSH, T4TOTAL, FREET4, T3FREE, THYROIDAB in the last 72 hours. Anemia Panel: No results for input(s): VITAMINB12, FOLATE, FERRITIN, TIBC, IRON, RETICCTPCT in the last 72 hours. Sepsis Labs: Recent Labs  Lab 09/07/19 1221  LATICACIDVEN 0.8    Recent Results (from the past 240 hour(s))    Culture, blood (Routine X 2) w Reflex to ID Panel     Status: None   Collection Time: 09/07/19 12:25 PM   Specimen: BLOOD  Result Value Ref Range Status   Specimen Description   Final    BLOOD RIGHT ANTECUBITAL Performed at [facility] [address], [place], [place] [address]    Special Requests   Final    BOTTLES DRAWN AEROBIC AND ANAEROBIC Blood Culture adequate volume Performed at [facility] [address], [place], [place] [address]    Culture   Final    NO GROWTH 5 DAYS Performed at [facility], [place] [address], [place], [place] [address]    Report Status 09/12/2019 FINAL  Final  SARS Coronavirus 2 by RT PCR (hospital order, performed in [facility] hospital lab) Nasopharyngeal Nasopharyngeal Swab     Status: None   Collection Time: 09/07/19  2:37 PM   Specimen: Nasopharyngeal Swab  Result Value Ref Range Status   SARS Coronavirus 2 NEGATIVE NEGATIVE Final    Comment: (NOTE) SARS-CoV-2 target nucleic acids are NOT DETECTED.  The SARS-CoV-2 RNA is generally detectable in upper and lower respiratory specimens during the acute phase of infection. The lowest concentration of SARS-CoV-2 viral copies this assay can detect is 250 copies / mL. A negative result does not preclude SARS-CoV-2 infection and should not be used as the sole basis for treatment or other patient management decisions.  A negative result may occur with improper specimen collection / handling, submission of specimen other than nasopharyngeal swab, presence of viral mutation(s) within the areas targeted by this assay, and inadequate number of viral copies (<250 copies / mL). A negative result must be combined with clinical observations, patient history, and epidemiological information.  Fact Sheet for Patients:   StrictlyIdeas.no  Fact Sheet for Healthcare Providers: BankingDealers.co.za  This test is not yet  approved or  cleared by the [place] FDA and has been authorized for detection and/or diagnosis of SARS-CoV-2 by FDA under an Emergency Use Authorization (EUA).  This EUA will remain in effect (meaning this test can be used) for the duration of the COVID-19 declaration under Section 564(b)(1)  of the Act, 21 U.S.C. section 360bbb-3(b)(1), unless the authorization is terminated or revoked sooner.  Performed at [facility], [place] [address] [place], [place] [address]   Culture, blood (Routine X 2) w Reflex to ID Panel     Status: None   Collection Time: 09/07/19  3:09 PM   Specimen: BLOOD RIGHT HAND  Result Value Ref Range Status   Specimen Description   Final    BLOOD RIGHT HAND Performed at [facility] [address], [place], [place] [address]    Special Requests   Final    BOTTLES DRAWN AEROBIC ONLY Blood Culture results may not be optimal due to an inadequate volume of blood received in culture bottles Performed at [facility] [address], [place], [place] [address]    Culture   Final    NO GROWTH 5 DAYS Performed at [facility], [place] [address] [place], [place] [address]    Report Status 09/12/2019 FINAL  Final  Body fluid culture     Status: None (Preliminary result)   Collection Time: 09/11/19  1:47 PM   Specimen: Synovium; Body Fluid  Result Value Ref Range Status   Specimen Description   Final    SYNOVIAL KNEE LEFT Performed at [facility] [address] [place], [place] [address]    Special Requests NONE  Final   Gram Stain   Final    ABUNDANT WBC PRESENT, PREDOMINANTLY PMN NO ORGANISMS SEEN    Culture   Final    NO GROWTH 2 DAYS Performed at [facility], [place] [address] [place], [place] [address]    Report Status PENDING  Incomplete         Radiology Studies: No results found.      Scheduled Meds: . amiodarone  200 mg Oral BID  . apixaban  5 mg  Oral BID  . atorvastatin  20 mg Oral Daily  . furosemide  40 mg Intravenous Q8H  . gabapentin  300 mg Oral BID  . insulin aspart  0-15 Units Subcutaneous TID WC  . insulin aspart  0-5 Units Subcutaneous QHS  . insulin glargine  40 Units Subcutaneous BID  . linezolid  600 mg Oral Q12H  . sodium bicarbonate  650 mg Oral TID   Continuous Infusions:    LOS: 6 days   Time spent= 35 mins    Jeffery Tandon Arsenio Loader, MD [facility]  If 7PM-7AM, please contact night-coverage  09/13/2019, 10:52 AM

## 2019-09-13 NOTE — Progress Notes (Signed)
Physical Therapy Treatment Patient Details Name: Jeffery [name] MRN: [id number] DOB: July 03, 1960 Today's Date: 09/13/2019    History of Present Illness 59 y.o. male with medical history significant of Obesity, DM2, HTN, P Afib, EtOH use, Charcot's foot, Chronic Pain syndrome, Hx of Cardioversion, diastolic CHF, chronic LE blister and edema s/p excision by podiatry comes with worsening of left lower ext foot pain    PT Comments    Pt tolerated increased ambulation distance of 59' with RW, distance limited by L knee pain and fatigue, no loss of balance. Decreased assistance needed for sit to stand transfers. Pt is progressing with mobility.   Follow Up Recommendations  SNF;Supervision/Assistance - 24 hour     Equipment Recommendations  Rolling walker with 5" wheels;3in1 (PT) (wide RW)    Recommendations for Other Services       Precautions / Restrictions Precautions Precautions: Fall Restrictions RLE Weight Bearing: Weight bearing as tolerated LLE Weight Bearing: Weight bearing as tolerated    Mobility  Bed Mobility Overal bed mobility: Needs Assistance         Sit to supine: Min assist   General bed mobility comments: min A for LLE into bed, used bedrail  Transfers Overall transfer level: Needs assistance Equipment used: Rolling walker (2 wheeled) Transfers: Sit to/from Stand Sit to Stand: Min assist         General transfer comment: VCs for hand placement and to scoot forward, min A to power up from recliner  Ambulation/Gait Ambulation/Gait assistance: Min guard Gait Distance (Feet): 85 Feet Assistive device: Rolling walker (2 wheeled) Gait Pattern/deviations: Step-through pattern;Decreased step length - right;Decreased step length - left;Decreased stance time - left;Decreased weight shift to left;Wide base of support Gait velocity: decreased   General Gait Details: steady, no loss of balance, distance limited by fatigue and L knee pain   Stairs              Wheelchair Mobility    Modified Rankin (Stroke Patients Only)       Balance Overall balance assessment: Needs assistance Sitting-balance support: Feet supported Sitting balance-Leahy Scale: Fair     Standing balance support: Bilateral upper extremity supported Standing balance-Leahy Scale: Poor                              Cognition Arousal/Alertness: Awake/alert Behavior During Therapy: WFL for tasks assessed/performed Overall Cognitive Status: Within Functional Limits for tasks assessed                                        Exercises      General Comments        Pertinent Vitals/Pain Pain Score: 10-Worst pain ever Pain Location: L knee with end range flexion Pain Descriptors / Indicators: Aching;Grimacing;Guarding Pain Intervention(s): Limited activity within patient's tolerance;Monitored during session;Premedicated before session;Repositioned    Home Living                      Prior Function            PT Goals (current goals can now be found in the care plan section) Acute Rehab PT Goals Patient Stated Goal: less pain PT Goal Formulation: With patient Time For Goal Achievement: 09/22/19 Potential to Achieve Goals: Good Progress towards PT goals: Progressing toward goals    Frequency    Min  3X/week      PT Plan Current plan remains appropriate    Co-evaluation              AM-PAC PT "6 Clicks" Mobility   Outcome Measure  Help needed turning from your back to your side while in a flat bed without using bedrails?: A Little Help needed moving from lying on your back to sitting on the side of a flat bed without using bedrails?: A Little Help needed moving to and from a bed to a chair (including a wheelchair)?: A Little Help needed standing up from a chair using your arms (e.g., wheelchair or bedside chair)?: A Little Help needed to walk in hospital room?: A Little Help needed climbing 3-5  steps with a railing? : A Lot 6 Click Score: 17    End of Session Equipment Utilized During Treatment: Gait belt Activity Tolerance: Patient tolerated treatment well Patient left: with call bell/phone within reach;in bed;with nursing/sitter in room Nurse Communication: Mobility status PT Visit Diagnosis: Muscle weakness (generalized) (M62.81);Difficulty in walking, not elsewhere classified (R26.2);Pain Pain - Right/Left: Left Pain - part of body: Knee     Time: 3794-4461 PT Time Calculation (min) (ACUTE ONLY): 24 min  Charges:  $Gait Training: 8-22 mins $Therapeutic Activity: 8-22 mins                    Blondell Reveal Kistler PT 09/13/2019  Acute Rehabilitation Services Pager [phone] Office [phone]

## 2019-09-13 NOTE — Progress Notes (Signed)
Patient continues to decline nocturnal CPAP. Equipment remains at bedside. RT will continue to follow and encourage use.

## 2019-09-13 NOTE — TOC Progression Note (Addendum)
Transition of Care [facility]) - Progression Note    Patient Details  Name: Jeffery [name] MRN: [id number] Date of Birth: 05/27/1960  Transition of Care [facility]) CM/SW [place], [place] Phone Number: 09/13/2019, 3:13 PM  Clinical Narrative:  Checked in with patient about disposition plan.  He informed me he is now open to going to SNF "since I need daily dressing changes and I have stairs at home."  No one made bed offer.  Am now calling facilities to see who might work with him.  Multiple facilities have said no.  Tracey with [facility] stated they could make bed offer if they get an LOG for therapy. Chantelle with [facility] is checking on eligibility, and whether patient would need to give up check while there. Tammi with [facility] is also checking with business office.  TOC will continue to follow during the course of hospitalization.      Expected Discharge Plan: Skilled Nursing Facility Barriers to Discharge: SNF Pending bed offer  Expected Discharge Plan and Services Expected Discharge Plan: [place]                                               Social Determinants of Health (SDOH) Interventions    Readmission Risk Interventions No flowsheet data found.

## 2019-09-14 ENCOUNTER — Encounter (HOSPITAL_COMMUNITY): Payer: Self-pay | Admitting: Internal Medicine

## 2019-09-14 ENCOUNTER — Telehealth: Payer: Self-pay | Admitting: Podiatry

## 2019-09-14 LAB — CBC
HCT: 28 % — ABNORMAL LOW (ref 39.0–52.0)
Hemoglobin: 8.4 g/dL — ABNORMAL LOW (ref 13.0–17.0)
MCH: 27 pg (ref 26.0–34.0)
MCHC: 30 g/dL (ref 30.0–36.0)
MCV: 90 fL (ref 80.0–100.0)
Platelets: 438 10*3/uL — ABNORMAL HIGH (ref 150–400)
RBC: 3.11 MIL/uL — ABNORMAL LOW (ref 4.22–5.81)
RDW: 15.9 % — ABNORMAL HIGH (ref 11.5–15.5)
WBC: 12.1 10*3/uL — ABNORMAL HIGH (ref 4.0–10.5)
nRBC: 0 % (ref 0.0–0.2)

## 2019-09-14 LAB — GLUCOSE, CAPILLARY
Glucose-Capillary: 123 mg/dL — ABNORMAL HIGH (ref 70–99)
Glucose-Capillary: 132 mg/dL — ABNORMAL HIGH (ref 70–99)
Glucose-Capillary: 138 mg/dL — ABNORMAL HIGH (ref 70–99)
Glucose-Capillary: 113 mg/dL — ABNORMAL HIGH (ref 70–99)

## 2019-09-14 LAB — BODY FLUID CULTURE: Culture: NO GROWTH

## 2019-09-14 LAB — BASIC METABOLIC PANEL
Anion gap: 10 (ref 5–15)
BUN: 67 mg/dL — ABNORMAL HIGH (ref 6–20)
CO2: 24 mmol/L (ref 22–32)
Calcium: 8.3 mg/dL — ABNORMAL LOW (ref 8.9–10.3)
Chloride: 103 mmol/L (ref 98–111)
Creatinine, Ser: 3.8 mg/dL — ABNORMAL HIGH (ref 0.61–1.24)
GFR calc Af Amer: 19 mL/min — ABNORMAL LOW (ref >60)
GFR calc non Af Amer: 17 mL/min — ABNORMAL LOW (ref >60)
Glucose, Bld: 112 mg/dL — ABNORMAL HIGH (ref 70–99)
Potassium: 4.8 mmol/L (ref 3.5–5.1)
Sodium: 137 mmol/L (ref 135–145)

## 2019-09-14 LAB — MAGNESIUM: Magnesium: 2.3 mg/dL (ref 1.7–2.4)

## 2019-09-14 MED ORDER — FUROSEMIDE 10 MG/ML IJ SOLN
60.0000 mg | Freq: Three times a day (TID) | INTRAMUSCULAR | Status: DC
  Administered 2019-09-14 – 2019-09-20 (×17): 60 mg via INTRAVENOUS
  Filled 2019-09-14 (×18): qty 6

## 2019-09-14 NOTE — Telephone Encounter (Signed)
Please advise thanks.

## 2019-09-14 NOTE — Consult Note (Signed)
Renal Service Consult Note [facility]  Jeffery [name] 09/14/2019 [name] Requesting Physician:  Dr Reesa Chew   Reason for Consult:  Renal failure HPI: The patient is a 59 y.o. year-old w/ hx of HTN, obesity, fatty liver, DM2/ charcot foot R, atrial fibrillation presented to ED on 09/07/19 w/ c/o worsening edema /weeping of the legs. Ambulating poorly due to edema. Had cellultis of L leg. Admitted and started on IV abx w/ vancomycin and given IVF"s as appeared dehydrated. Creat was 2.4 on admit (baseline 1.6).  Creat worsened up to 3.5 then 3.8, started IV lasix and imrpoved 3.3 but then went up again today at 3.80. Asked to see for renal failure.    Pt states he doesn't have renal MD, but has been told from his foot doctor that he has some kidney issues.  Leg swelling is not new but only started w/ recent atrial fib that lasted 4 months and he was cardioverted ultimately. Then was admitted in May 2021 for infection in the R foot and "they pumped me full of fluids" and he has still had leg swelling since that admit.   Taking lasix at home 40-80 mg per day.    Pt has rec'd IV lasix 40 tid x 2 days here , put on hold after am dose today.  Pt states that his urine is now a "clear lighter color" like beer, before it was dark.         Date  Creat  eGFR  2012- 18 0.69- 0.90     2019- 2020 0.96- 1.13 May 2019 0.98  May 2021 1.10-  2.31 40- >60 ml/min  September 04, 2019 1.63  46  June 10- 17   28 > 17 ml/min   ROS  denies CP  no joint pain   no HA  no blurry vision  no rash  no diarrhea  no nausea/ vomiting  no dysuria  no difficulty voiding  no change in urine color    Past Medical History  Past Medical History:  Diagnosis Date  . Atrial fibrillation ([place])   . Cellulitis and abscess of foot 07/2019   RIGHT FOOT  . Charcot's joint of right foot   . DDD (degenerative disc disease), lumbar   . Diabetes mellitus without complication ([place])   . Fatty liver   .  Hypertension   . Obesity   . Rotator cuff disorder   . Shoulder impingement, right   . Spinal stenosis at L4-L5 level    Past Surgical History  Past Surgical History:  Procedure Laterality Date  . AMPUTATION Left 10/02/2014   Procedure: Left Third toe amputation ;  Surgeon: [name], MD;  Location: [place];  Service: Orthopedics;  Laterality: Left;  Regular bed, wants to follow hip  . ANTERIOR CRUCIATE LIGAMENT REPAIR Right 90   reconstruction  . APPLICATION OF WOUND VAC Left 10/02/2014   Procedure: APPLICATION OF WOUND VAC; toe Surgeon: [name], MD;  Location: [place];  Service: Orthopedics;  Laterality: Left;  . CARDIOVERSION N/A 04/26/2019   Procedure: CARDIOVERSION;  Surgeon: Pixie Casino, MD;  Location: [facility] ENDOSCOPY;  Service: Cardiovascular;  Laterality: N/A;  . CARDIOVERSION N/A 05/18/2019   Procedure: CARDIOVERSION (CATH LAB);  Surgeon: Constance Haw, MD;  Location: [place] CV LAB;  Service: Cardiovascular;  Laterality: N/A;  . I & D EXTREMITY Left 10/05/2014   Procedure: IRRIGATION AND DEBRIDEMENT LEFT FOOT;  Surgeon: [name], MD;  Location: [place];  Service: Orthopedics;  Laterality: Left;  . INCISION AND DRAINAGE Right 08/09/2019   Procedure: INCISION AND DRAINAGE RIGHT FOOT;  Surgeon: Trula Slade, DPM;  Location: [place];  Service: Podiatry;  Laterality: Right;  . KNEE ARTHROSCOPY W/ ACL RECONSTRUCTION Right   . TOTAL KNEE ARTHROPLASTY Right 03/28/2015  . TOTAL KNEE ARTHROPLASTY Right 03/28/2015   Procedure: RIGHT TOTAL KNEE ARTHROPLASTY;  Surgeon: [name], MD;  Location: [place];  Service: Orthopedics;  Laterality: Right;  . WOUND DEBRIDEMENT Right 08/14/2019   Procedure: DEBRIDEMENT WOUND;  Surgeon: Trula Slade, DPM;  Location: [place];  Service: Podiatry;  Laterality: Right;   Family History  Family History  Problem Relation Age of Onset  . Diabetes Father   . Hypertension Father   . Heart failure Father    Social History  reports that he  has quit smoking. His smoking use included cigarettes. He smoked 0.00 packs per day for 38.00 years. He has never used smokeless tobacco. He reports current drug use. Drug: Marijuana. He reports that he does not drink alcohol. Allergies  Allergies  Allergen Reactions  . Metformin And Related Other (See Comments)    GI upset   Home medications Prior to Admission medications   Medication Sig Start Date End Date Taking? Authorizing Provider  amiodarone (PACERONE) 200 MG tablet Take 200 mg by mouth 2 (two) times daily.    Yes [provider]  apixaban (ELIQUIS) 5 MG TABS tablet Take 1 tablet (5 mg total) by mouth 2 (two) times daily. 06/20/19  Yes [name], MD  atorvastatin (LIPITOR) 20 MG tablet Take 1 tablet (20 mg total) by mouth daily. 03/28/19  Yes [name], MD  doxycycline (VIBRA-TABS) 100 MG tablet Take 1 tablet (100 mg total) by mouth 2 (two) times daily. 09/05/19  Yes Trula Slade, DPM  furosemide (LASIX) 40 MG tablet Take 1 tablet (40 mg total) by mouth daily. Patient taking differently: Take 40-80 mg by mouth See admin instructions. Per MD pt started taking 58m 09/06/19 x3days then he is to resume back to 447mQD 04/21/19 09/07/19 Yes KeTroy SineMD  gabapentin (NEURONTIN) 300 MG capsule Take 1 capsule (300 mg total) by mouth 2 (two) times daily. Patient taking differently: Take 600 mg by mouth at bedtime as needed (pain).  03/28/19  Yes Newlin, EnCharlane FerrettiMD  insulin glargine (LANTUS SOLOSTAR) 100 UNIT/ML Solostar Pen Inject 50 Units into the skin 2 (two) times daily. Patient taking differently: Inject 51 Units into the skin 2 (two) times daily.  06/20/19  Yes Newlin, EnCharlane FerrettiMD  insulin lispro (INSULIN LISPRO) 100 UNIT/ML KwikPen Junior Inject 0.08 mLs (8 Units total) into the skin 3 (three) times daily. Patient taking differently: Inject 8 Units into the skin 3 (three) times daily as needed (Low blood sugar).  03/28/19  Yes NeCharlott RakesMD  metoprolol  tartrate (LOPRESSOR) 100 MG tablet Take 0.5 tablets (50 mg total) by mouth 2 (two) times daily. 08/24/19 11/22/19 Yes Camnitz, Will MaHassell DoneMD  oxyCODONE-acetaminophen (PERCOCET) 10-325 MG tablet Take 1 tablet by mouth every 4 (four) hours as needed for pain.    Yes [provider]  polyethylene glycol (MIRALAX / GLYCOLAX) 17 g packet Take 17 g by mouth daily as needed for mild constipation. 08/16/19  Yes SaNita SellsMD  Sennosides (EX-LAX) 15 MG TABS Take 15 mg by mouth daily as needed (Constipation).   Yes [provider]  sodium chloride (OCEAN) 0.65 %  SOLN nasal spray Place 1 spray into both nostrils daily as needed for congestion.    Yes [provider]  Accu-Chek FastClix Lancets MISC Use as directed to check blood sugar at least twice daily. E11.8 E11.65 Z79.4 Patient taking differently: 1 each by Other route See admin instructions. Use as directed to check blood sugar at least twice daily. E11.8 E11.65 Z79.4 10/18/18   [name], MD  Blood Glucose Monitoring Suppl (ACCU-CHEK AVIVA) device Use as instructed to check blood sugar 2 times daily. E11.8 E11.65 Z79.4 Patient taking differently: 1 each by Other route See admin instructions. Use as instructed to check blood sugar 2 times daily. E11.8 E11.65 Z79.4 10/18/18 10/18/19  [name], MD  glucose blood (ACCU-CHEK AVIVA) test strip Use as instructed to check blood sugar 2 times daily. E11.8 E11.65 Z79.4 Patient taking differently: 1 each by Other route See admin instructions. Use as instructed to check blood sugar 2 times daily. E11.8 E11.65 Z79.4 10/18/18   [name], MD  Insulin Pen Needle (B-D ULTRAFINE III SHORT PEN) 31G X 8 MM MISC 1 each by Does not apply route 3 (three) times daily. 10/12/17   [name], MD  Insulin Syringe-Needle U-100 (TRUEPLUS INSULIN SYRINGE) 30G X 5/16" 0.5 ML MISC Use as directed 3 times daily Patient taking differently: 1 each by Other route See admin instructions.  Use as directed 3 times daily 10/06/17   [name], MD  liraglutide (VICTOZA) 18 MG/3ML SOPN Inject 0.3 mLs (1.8 mg total) into the skin daily. Patient not taking: Reported on 09/07/2019 06/20/19   [name], MD  TRUEPLUS INSULIN SYRINGE 30G X 5/16" 0.5 ML MISC USE AS DIRECTED 3 TIMES DAILY Patient taking differently: 1 each by Other route in the morning, at noon, and at bedtime.  04/03/16   Angelica Chessman E, MD     Vitals:   09/13/19 2158 09/14/19 0500 09/14/19 0504 09/14/19 1415  BP: (!) 154/77  (!) 148/74 (!) 160/81  Pulse: 63  65 70  Resp: 18  17 20   Temp: 97.6 F (36.4 C)  (!) 97.5 F (36.4 C) 97.7 F (36.5 C)  TempSrc: Oral  Oral Oral  SpO2: 97%  98% 97%  Weight:  (!) 162.7 kg    Height:       Exam Gen alert, no distress No rash, cyanosis or gangrene Sclera anicteric, throat clear  No jvd or bruits Chest clear bilat to bases no rales, wheezing or bronchial BS RRR no MRG Abd soft ntnd no mass or ascites +bs GU normal male MS no joint effusions or deformity Ext diffuse leg and hip edema 2+ pitting, min abd wall edema Neuro is alert, Ox 3 , nf    Date  Creat  eGFR  2012- 18 0.69- 0.90     2019- 2020 0.96- 1.13 May 2019 0.98  May 2021 1.10-  2.31 40- >60 ml/min  September 04, 2019 1.63  46  June 10- 17   28 > 17 ml/min    Home meds:  - amiodarone 200 bid/ eliquis 5 bid/ lipitor 20/ lasix 40-80 qd/ metoprolol 50 bid  - liraglutide 1.8 sq qd/ insulin lantus 51u bid/ insulin lispro 8u tid ac  - percocet prn qid/ gabapentin 600 hs prn  - prn's/ vitamins/ supplements    UA Oct 2018 - negative   UA 08/08/19 - cloudy, 100 prot, few bact, 0-5 rbc, 11-20 wbc   UA 09/10/19 - hazy, large Hb, 30 prot, >50 rbc, 11-20 wbc  Renal US 6/12 - 11.3/ 12.0 cm kidneys, limited exam d/t body habitus, no hydro, relative preservation of cortical thickness   ECHO 02/2019 - LVEF 55-60%, +LVH, mild reduced RV fxn, no valve issues    I/O since admit are 11.2L in and 15.8 L out  = -  4.5 L net    Wt's are down 168kg > 163kd today = - 5kg   BP's have been high since admission    Assessment/ Plan: 1. AoCKD 3 - baseline creat 1.6, eGFR 46 ml/min. Baseline CKD likely due to DM/ HTN.  Here creat 2.5 >> 3.8, in setting of marked vol overload, hx of RV dysfunction. Prob cardiorenal picture. Down 5kg here so far, has much more fluid on still.  Lungs are clear. Will get CXR. Perhaps this is mostly R sided HF.  Needs diuresis either way, will resume lasix IV at slightly higher dose of 60 mg tid.  Repeat UA.  Will follow.  2. Vol overload - as above 3. HTN - BP's are high , which goes along w/ vol overload. Per primary. Get vol down, avoid acei /ARB for now.  Not on any BP lowering meds at this time.  4. DM2 on insulin 5. Atrial fibrillation - on amio, eliquis 6. LLE cellulitis - sp vanc, on linezolid now 7. Morbid obesity 8. R HF - by last echo 02/2019      Kelly Splinter  MD 09/14/2019, 2:58 PM  Recent Labs  Lab 09/13/19 0339 09/14/19 0356  WBC 14.5* 12.1*  HGB 9.4* 8.4*   Recent Labs  Lab 09/11/19 0558 09/12/19 0358 09/13/19 0339 09/14/19 0356  K 4.6  --  4.6 4.8  BUN 72*  --  66* 67*  CREATININE 3.67*   < > 3.36* 3.80*  CALCIUM 8.1*  --  8.2* 8.3*  PHOS 6.9*  --   --   --    < > = values in this interval not displayed.

## 2019-09-14 NOTE — Progress Notes (Signed)
Pt continues to decline nocturnal cpap.  Pt stated he has tried it but it is too uncomfortable.  Pt states he does not want to wear it anymore while here in the hospital.  Cpap machine removed from room per pt request.  RN aware.

## 2019-09-14 NOTE — Progress Notes (Signed)
PROGRESS NOTE    Jeffery [name]  VOJ:[id number] DOB: 18-Sep-1960 DOA: 09/07/2019 PCP: [name], MD   Brief Narrative:  59 y.o. male with medical history significant of Obesity, DM2, HTN, P Afib, EtOH use, Charcot's foot, Chronic Pain syndrome, Hx of Cardioversion, diastolic CHF, chronic LE blister and edema s/p excision by podiatry comes with worsening of left lower ext foot pain.  Admitted for sepsis secondary to purulent left lower extremity cellulitis.  Hospital course complicated by AKI.  MRI left leg suggested extensive cellulitis with moderate left knee effusion.  Orthopedic consulted who aspirated left knee fluid suggestive of inflammatory fluid.    Assessment & Plan:   Principal Problem:   Cellulitis of left lower extremity Active Problems:   Charcot foot due to diabetes mellitus ([place])   Essential hypertension   Uncontrolled type 2 diabetes mellitus with diabetic polyneuropathy, without long-term current use of insulin (H[facility])   Paroxysmal atrial fibrillation ([facility])   Chronic anticoagulation   AKI (acute kidney injury) ([place])   Left leg cellulitis   Sepsis ([place])   Acute pain of left knee  Sepsis secondary to left Lower extremity Cellulitis with some purulent; POA B/L lower extremity chronic skin changes with blister -Sepsis physiology improved. -UA-possible mild UTI?,  Blood cultures negative -Initially on IV vancomycin, now on linezolid.  Currently no clear date to discontinued, will clinically follow. -Pain control, bowel regimen -Ultrasound ABI-difficult to obtain due to blisters -MRI-left lower extremity cellulitis but no evidence of abscess/osteomyelitis -Daily dressing changes-Xeroform, Kerlix, Ace.  Left shoulder and left ankle pain, improved Acute mild gout flare Moderate left knee effusion-inflammatory versus infectious -X-ray showed chronic changes.  MRI showed soft tissue inflammation concerning for cellulitis with moderate left knee effusion -Seen by  Ortho status post knee aspiration 6/14 suggestive of inflammatory fluid.  Received colchicine doing better. -Pain control -Continue work with PT/OT-SNF.  Working with [facility] team  Acute Kidney Injury on CKD Stage IIIa, worsening -Baseline creatinine 1.6, admission creatinine 2.4.  Creatinine now is hovering between 3.6-3.8.  Will consult nephrology -Periodic bladder scan to ensure he is not having any urinary retention. -Renal ultrasound-negative  Sinus bradycardia -Continue amiodarone.  Discontinue metoprolol for now.  Essential Hypertension  -As needed hydralazine if necessary  DM2 with neuropathy , controlled -Insulin sliding scale and Accu-Chek.  Lantus 50 units twice daily -Gabapentin -Diabetic diet  Paroxysmal A Fib, s/p Cardioversion -On  amiodarone and Eliquis  PT-recommending SNF  DVT prophylaxis: Eliquis Code Status: Full code Family Communication: None  Status is: Inpatient  Remains inpatient appropriate because:IV treatments appropriate due to intensity of illness or inability to take PO   Dispo: The patient is from: Home              Anticipated d/c is to: SNF              Anticipated d/c date is: 2 days              Patient currently is not medically stable to d/c.   Maintain hospital stay for evaluation of his acute kidney injury, nephrology team has been consulted.  In the meantime patient will work with [facility] team for SNF.  Subjective: Patient feels okay remains afebrile overnight.  No new complaints.  Review of Systems Otherwise negative except as per HPI, including: General: Denies fever, chills, night sweats or unintended weight loss. Resp: Denies cough, wheezing, shortness of breath. Cardiac: Denies chest pain, palpitations, orthopnea, paroxysmal nocturnal dyspnea. GI: Denies abdominal pain, nausea,  vomiting, diarrhea or constipation GU: Denies dysuria, frequency, hesitancy or incontinence MS: Denies muscle aches, joint pain or swelling Neuro:  Denies headache, neurologic deficits (focal weakness, numbness, tingling), abnormal gait Psych: Denies anxiety, depression, SI/HI/AVH Skin: Denies new rashes or lesions ID: Denies sick contacts, exotic exposures, travel  Examination: Constitutional: Not in acute distress Respiratory: Clear to auscultation bilaterally Cardiovascular: Normal sinus rhythm, no rubs Abdomen: Nontender nondistended good bowel sounds Musculoskeletal: No edema noted.  Good flexion and extension of his bilateral lower extremity/knees Skin: Bilateral lower extremity dressing in place, erythema has improved Neurologic: CN 2-12 grossly intact.  And nonfocal Psychiatric: Normal judgment and insight. Alert and oriented x 3. Normal mood.  Objective: Vitals:   09/13/19 1342 09/13/19 2158 09/14/19 0500 09/14/19 0504  BP: (!) 170/81 (!) 154/77  (!) 148/74  Pulse: 66 63  65  Resp: 18 18  17   Temp: (!) 97.5 F (36.4 C) 97.6 F (36.4 C)  (!) 97.5 F (36.4 C)  TempSrc: Oral Oral  Oral  SpO2: 100% 97%  98%  Weight:   (!) 162.7 kg   Height:        Intake/Output Summary (Last 24 hours) at 09/14/2019 1145 Last data filed at 09/14/2019 0847 Gross per 24 hour  Intake 1440 ml  Output 3525 ml  Net -2085 ml   Filed Weights   09/12/19 0500 09/13/19 0500 09/14/19 0500  Weight: (!) 167.2 kg (!) 165.8 kg (!) 162.7 kg     Data Reviewed:   CBC: Recent Labs  Lab 09/07/19 1221 09/08/19 0344 09/10/19 0652 09/11/19 0558 09/12/19 0358 09/13/19 0339 09/14/19 0356  WBC 19.4*   < > 14.0* 14.0* 13.2* 14.5* 12.1*  NEUTROABS 15.5*  --   --   --   --   --   --   HGB 10.1*   < > 9.2* 8.7* 8.0* 9.4* 8.4*  HCT 33.9*   < > 30.3* 29.4* 26.7* 31.9* 28.0*  MCV 91.6   < > 89.4 91.6 89.6 91.1 90.0  PLT 506*   < > 479* 474* 427* 484* 438*   < > = values in this interval not displayed.   Basic Metabolic Panel: Recent Labs  Lab 09/09/19 0620 09/09/19 0620 09/10/19 6568 09/11/19 0558 09/12/19 0358 09/13/19 0339  09/14/19 0356  NA 133*  --  135 136  --  137 137  K 4.8  --  4.9 4.6  --  4.6 4.8  CL 102  --  103 104  --  104 103  CO2 19*  --  20* 20*  --  21* 24  GLUCOSE 96  --  84 113*  --  123* 112*  BUN 67*  --  71* 72*  --  66* 67*  CREATININE 3.74*   < > 3.60* 3.67* 3.86* 3.36* 3.80*  CALCIUM 8.5*  --  8.4* 8.1*  --  8.2* 8.3*  MG 2.6*   < > 2.6* 2.7* 2.7* 2.6* 2.3  PHOS  --   --   --  6.9*  --   --   --    < > = values in this interval not displayed.   GFR: Estimated Creatinine Clearance: 34.7 mL/min (A) (by C-G formula based on SCr of 3.8 mg/dL (H)). Liver Function Tests: Recent Labs  Lab 09/07/19 1221 09/08/19 0344 09/09/19 0620 09/10/19 0652 09/11/19 0558  AST 24 21 52* 91*  --   ALT 31 23 40 66*  --   ALKPHOS 84 64 78 77  --  BILITOT 0.9 0.8 0.8 0.6  --   PROT 7.6 6.2* 7.2 6.8  --   ALBUMIN 2.3* 1.9* 2.1* 2.0* 1.9*   No results for input(s): LIPASE, AMYLASE in the last 168 hours. No results for input(s): AMMONIA in the last 168 hours. Coagulation Profile: No results for input(s): INR, PROTIME in the last 168 hours. Cardiac Enzymes: Recent Labs  Lab 09/07/19 1221  CKTOTAL 322   BNP (last 3 results) No results for input(s): PROBNP in the last 8760 hours. HbA1C: No results for input(s): HGBA1C in the last 72 hours. CBG: Recent Labs  Lab 09/13/19 1147 09/13/19 1607 09/13/19 2114 09/14/19 0753 09/14/19 1122  GLUCAP 139* 120* 171* 123* 132*   Lipid Profile: No results for input(s): CHOL, HDL, LDLCALC, TRIG, CHOLHDL, LDLDIRECT in the last 72 hours. Thyroid Function Tests: No results for input(s): TSH, T4TOTAL, FREET4, T3FREE, THYROIDAB in the last 72 hours. Anemia Panel: No results for input(s): VITAMINB12, FOLATE, FERRITIN, TIBC, IRON, RETICCTPCT in the last 72 hours. Sepsis Labs: Recent Labs  Lab 09/07/19 1221  LATICACIDVEN 0.8    Recent Results (from the past 240 hour(s))  Culture, blood (Routine X 2) w Reflex to ID Panel     Status: None   Collection  Time: 09/07/19 12:25 PM   Specimen: BLOOD  Result Value Ref Range Status   Specimen Description   Final    BLOOD RIGHT ANTECUBITAL Performed at [facility] [address], [place], [place] [address]    Special Requests   Final    BOTTLES DRAWN AEROBIC AND ANAEROBIC Blood Culture adequate volume Performed at [place] [address], [place], [place] [address]    Culture   Final    NO GROWTH 5 DAYS Performed at [facility], [place] [address] [place], [place] [address]    Report Status 09/12/2019 FINAL  Final  SARS Coronavirus 2 by RT PCR (hospital order, performed in [facility] hospital lab) Nasopharyngeal Nasopharyngeal Swab     Status: None   Collection Time: 09/07/19  2:37 PM   Specimen: Nasopharyngeal Swab  Result Value Ref Range Status   SARS Coronavirus 2 NEGATIVE NEGATIVE Final    Comment: (NOTE) SARS-CoV-2 target nucleic acids are NOT DETECTED.  The SARS-CoV-2 RNA is generally detectable in upper and lower respiratory specimens during the acute phase of infection. The lowest concentration of SARS-CoV-2 viral copies this assay can detect is 250 copies / mL. A negative result does not preclude SARS-CoV-2 infection and should not be used as the sole basis for treatment or other patient management decisions.  A negative result may occur with improper specimen collection / handling, submission of specimen other than nasopharyngeal swab, presence of viral mutation(s) within the areas targeted by this assay, and inadequate number of viral copies (<250 copies / mL). A negative result must be combined with clinical observations, patient history, and epidemiological information.  Fact Sheet for Patients:   StrictlyIdeas.no  Fact Sheet for Healthcare Providers: BankingDealers.co.za  This test is not yet approved or  cleared by the [place] FDA and has been authorized for  detection and/or diagnosis of SARS-CoV-2 by FDA under an Emergency Use Authorization (EUA).  This EUA will remain in effect (meaning this test can be used) for the duration of the COVID-19 declaration under Section 564(b)(1) of the Act, 21 U.S.C. section 360bbb-3(b)(1), unless the authorization is terminated or revoked sooner.  Performed at [facility], [address] [place], [place]  [address]   Culture, blood (Routine X 2) w Reflex to ID Panel     Status: None   Collection Time: 09/07/19  3:09 PM   Specimen: BLOOD RIGHT HAND  Result Value Ref Range Status   Specimen Description   Final    BLOOD RIGHT HAND Performed at [facility] [address], [place], [place] [address]    Special Requests   Final    BOTTLES DRAWN AEROBIC ONLY Blood Culture results may not be optimal due to an inadequate volume of blood received in culture bottles Performed at [place] [address], [place], [place] [address]    Culture   Final    NO GROWTH 5 DAYS Performed at [facility], [place] [address] [place], [place] [address]    Report Status 09/12/2019 FINAL  Final  Body fluid culture     Status: None   Collection Time: 09/11/19  1:47 PM   Specimen: Synovium; Body Fluid  Result Value Ref Range Status   Specimen Description   Final    SYNOVIAL KNEE LEFT Performed at [facility] [address], [place], [place] [address]    Special Requests NONE  Final   Gram Stain   Final    ABUNDANT WBC PRESENT, PREDOMINANTLY PMN NO ORGANISMS SEEN    Culture   Final    NO GROWTH Performed at [facility], [place] [address] [place],  [address]    Report Status 09/14/2019 FINAL  Final         Radiology Studies: No results found.      Scheduled Meds: . amiodarone  200 mg Oral BID  . apixaban  5 mg Oral BID  . atorvastatin  20 mg Oral Daily  . gabapentin  300 mg Oral BID  . insulin aspart   0-15 Units Subcutaneous TID WC  . insulin aspart  0-5 Units Subcutaneous QHS  . insulin glargine  40 Units Subcutaneous BID  . linezolid  600 mg Oral Q12H  . sodium bicarbonate  650 mg Oral TID   Continuous Infusions:    LOS: 7 days   Time spent= 35 mins    Kinnick Maus Arsenio Loader, MD [facility]  If 7PM-7AM, please contact night-coverage  09/14/2019, 11:45 AM

## 2019-09-14 NOTE — Progress Notes (Signed)
Physical Therapy Treatment Patient Details Name: Jeffery [name] MRN: [id number] DOB: 01/06/1961 Today's Date: 09/14/2019    History of Present Illness 59 y.o. male with medical history significant of Obesity, DM2, HTN, P Afib, EtOH use, Charcot's foot, Chronic Pain syndrome, Hx of Cardioversion, diastolic CHF, chronic LE blister and edema s/p excision by podiatry comes with worsening of left lower ext foot pain    PT Comments    Patient progressing well with acute PT. He was able to ambulate greater distance, ~200' this session with RW. Pt continues to c/o of Lt knee pain with mobility but reports foot is much improved. Patient will continue to benefit from skilled PT interventions to progress mobility as able.    Follow Up Recommendations  SNF;Supervision/Assistance - 24 hour     Equipment Recommendations  Rolling walker with 5" wheels;3in1 (PT) (wide)    Recommendations for Other Services       Precautions / Restrictions Precautions Precautions: Fall Restrictions Weight Bearing Restrictions: No RLE Weight Bearing: Weight bearing as tolerated LLE Weight Bearing: Weight bearing as tolerated    Mobility  Bed Mobility Overal bed mobility: Needs Assistance Bed Mobility: Supine to Sit     Supine to sit: HOB elevated;Supervision     General bed mobility comments: pt requires increased time and use of bed rail. no assist.  Transfers Overall transfer level: Needs assistance Equipment used: Rolling walker (2 wheeled) Transfers: Sit to/from Stand Sit to Stand: Min guard;From elevated surface         General transfer comment: cues for hand placement, bed slightly elevated.   Ambulation/Gait Ambulation/Gait assistance: Min guard Gait Distance (Feet): 200 Feet Assistive device: Rolling walker (2 wheeled) Gait Pattern/deviations: Step-through pattern;Decreased step length - right;Decreased step length - left;Decreased stance time - left;Decreased weight shift to  left;Wide base of support Gait velocity: decreased   General Gait Details: pt steady and no LOB noted. cues to maintain safe proximity to RW intermittently.   Stairs             Wheelchair Mobility    Modified Rankin (Stroke Patients Only)       Balance Overall balance assessment: Needs assistance Sitting-balance support: Feet supported Sitting balance-Leahy Scale: Good     Standing balance support: Bilateral upper extremity supported Standing balance-Leahy Scale: Fair           Cognition Arousal/Alertness: Awake/alert Behavior During Therapy: WFL for tasks assessed/performed Overall Cognitive Status: Within Functional Limits for tasks assessed             Exercises General Exercises - Lower Extremity Ankle Circles/Pumps: AROM;Both;10 reps;Seated    General Comments        Pertinent Vitals/Pain Pain Assessment: 0-10 Pain Score: 10-Worst pain ever Pain Location: Lt knee Pain Descriptors / Indicators: Aching;Discomfort;Sore Pain Intervention(s): Limited activity within patient's tolerance;Monitored during session;Repositioned;Ice applied;Patient requesting pain meds-RN notified           PT Goals (current goals can now be found in the care plan section) Acute Rehab PT Goals Patient Stated Goal: less pain PT Goal Formulation: With patient Time For Goal Achievement: 09/22/19 Potential to Achieve Goals: Good Progress towards PT goals: Progressing toward goals    Frequency    Min 3X/week      PT Plan Current plan remains appropriate       AM-PAC PT "6 Clicks" Mobility   Outcome Measure  Help needed turning from your back to your side while in a flat bed without using bedrails?: None  Help needed moving from lying on your back to sitting on the side of a flat bed without using bedrails?: A Little Help needed moving to and from a bed to a chair (including a wheelchair)?: A Little Help needed standing up from a chair using your arms (e.g.,  wheelchair or bedside chair)?: A Little Help needed to walk in hospital room?: A Little Help needed climbing 3-5 steps with a railing? : A Lot 6 Click Score: 18    End of Session Equipment Utilized During Treatment: Gait belt Activity Tolerance: Patient tolerated treatment well Patient left: in chair;with call bell/phone within reach;with family/visitor present Nurse Communication: Mobility status PT Visit Diagnosis: Muscle weakness (generalized) (M62.81);Difficulty in walking, not elsewhere classified (R26.2);Pain Pain - Right/Left: Left Pain - part of body: Knee     Time: 1237-1258 PT Time Calculation (min) (ACUTE ONLY): 21 min  Charges:  $Gait Training: 8-22 mins                     Verner Mould, DPT Acute Rehabilitation Services  Office [phone] Pager [phone]  09/14/2019 2:17 PM

## 2019-09-14 NOTE — TOC Progression Note (Signed)
Transition of Care [facility]) - Progression Note    Patient Details  Name: MIKHAEL HENDRIKS MRN: [id number] Date of Birth: 1960/07/13  Transition of Care [facility]) CM/SW [place], [place] Phone Number: 09/14/2019, 3:20 PM  Clinical Narrative:  Patient has firm bed offer with [facility] can receive him tomorrow if medically stable.  Will ask Dr for COVID order. TOC will continue to follow during the course of hospitalization.      Expected Discharge Plan: Skilled Nursing Facility Barriers to Discharge: No Barriers Identified  Expected Discharge Plan and Services Expected Discharge Plan: [place]                                               Social Determinants of Health (SDOH) Interventions    Readmission Risk Interventions No flowsheet data found.

## 2019-09-14 NOTE — Addendum Note (Signed)
Addended by: Linton Ham on: 09/14/2019 11:35 AM   Modules accepted: Orders

## 2019-09-14 NOTE — Telephone Encounter (Signed)
Pt called lvm stating he was hospitalized and he had not had a dressing change and wanted to know if someone from our office was coming to change the dressing please advise

## 2019-09-15 ENCOUNTER — Telehealth: Payer: Self-pay

## 2019-09-15 ENCOUNTER — Inpatient Hospital Stay (HOSPITAL_COMMUNITY): Payer: Medicaid Other

## 2019-09-15 LAB — BASIC METABOLIC PANEL
Anion gap: 10 (ref 5–15)
BUN: 61 mg/dL — ABNORMAL HIGH (ref 6–20)
CO2: 23 mmol/L (ref 22–32)
Calcium: 8.5 mg/dL — ABNORMAL LOW (ref 8.9–10.3)
Chloride: 104 mmol/L (ref 98–111)
Creatinine, Ser: 3.6 mg/dL — ABNORMAL HIGH (ref 0.61–1.24)
GFR calc Af Amer: 20 mL/min — ABNORMAL LOW (ref >60)
GFR calc non Af Amer: 18 mL/min — ABNORMAL LOW (ref >60)
Glucose, Bld: 140 mg/dL — ABNORMAL HIGH (ref 70–99)
Potassium: 4.7 mmol/L (ref 3.5–5.1)
Sodium: 137 mmol/L (ref 135–145)

## 2019-09-15 LAB — SARS CORONAVIRUS 2 (TAT 6-24 HRS): SARS Coronavirus 2: NEGATIVE

## 2019-09-15 LAB — GLUCOSE, CAPILLARY
Glucose-Capillary: 88 mg/dL (ref 70–99)
Glucose-Capillary: 161 mg/dL — ABNORMAL HIGH (ref 70–99)
Glucose-Capillary: 123 mg/dL — ABNORMAL HIGH (ref 70–99)
Glucose-Capillary: 134 mg/dL — ABNORMAL HIGH (ref 70–99)

## 2019-09-15 LAB — CBC
HCT: 29.8 % — ABNORMAL LOW (ref 39.0–52.0)
Hemoglobin: 8.9 g/dL — ABNORMAL LOW (ref 13.0–17.0)
MCH: 26.6 pg (ref 26.0–34.0)
MCHC: 29.9 g/dL — ABNORMAL LOW (ref 30.0–36.0)
MCV: 89.2 fL (ref 80.0–100.0)
Platelets: 468 10*3/uL — ABNORMAL HIGH (ref 150–400)
RBC: 3.34 MIL/uL — ABNORMAL LOW (ref 4.22–5.81)
RDW: 15.7 % — ABNORMAL HIGH (ref 11.5–15.5)
WBC: 12.4 10*3/uL — ABNORMAL HIGH (ref 4.0–10.5)
nRBC: 0 % (ref 0.0–0.2)

## 2019-09-15 LAB — MAGNESIUM: Magnesium: 2.2 mg/dL (ref 1.7–2.4)

## 2019-09-15 MED ORDER — POLYETHYLENE GLYCOL 3350 17 G PO PACK
17.0000 g | PACK | Freq: Every day | ORAL | Status: DC
  Administered 2019-09-16 – 2019-09-21 (×6): 17 g via ORAL
  Filled 2019-09-15 (×6): qty 1

## 2019-09-15 MED ORDER — SENNOSIDES-DOCUSATE SODIUM 8.6-50 MG PO TABS
1.0000 | ORAL_TABLET | Freq: Two times a day (BID) | ORAL | Status: DC
  Administered 2019-09-15 – 2019-09-21 (×12): 1 via ORAL
  Filled 2019-09-15 (×11): qty 1

## 2019-09-15 MED ORDER — AMLODIPINE BESYLATE 5 MG PO TABS
5.0000 mg | ORAL_TABLET | Freq: Every day | ORAL | Status: DC
  Administered 2019-09-15 – 2019-09-16 (×2): 5 mg via ORAL
  Filled 2019-09-15 (×2): qty 1

## 2019-09-15 NOTE — Telephone Encounter (Signed)
Hey, I tried to call Jeffery [name] and left a message for Jeffery [name] to call me and I also called Jeffery [facility] and [name] is in room 1534 and I could not get any one to answer Jeffery phone and I mentioned this to Dr Posey Pronto. Lattie Haw

## 2019-09-15 NOTE — Progress Notes (Signed)
[name] Progress Note  Subjective: creat down 3.60, 3.1 L uop yest   Vitals:   09/14/19 1415 09/14/19 2105 09/15/19 0418 09/15/19 0458  BP: (!) 160/81 (!) 142/62 (!) 153/77   Pulse: 70 68 70   Resp: _0 Temp: 97.7 F (36.5 C) 97.7 F (36.5 C) 97.9 F (36.6 C)   TempSrc: Oral     SpO2: 97% 95% 97%   Weight:    (!) 159.4 kg  Height:        Exam: Gen alert, no distress No rash, cyanosis or gangrene Sclera anicteric, throat clear  No jvd or bruits Chest clear bilat to bases no rales, wheezing or bronchial BS RRR no MRG Abd soft ntnd no mass or ascites +bs GU normal male MS no joint effusions or deformity Ext diffuse 2-3+ LE and smaller UE edema Neuro is alert, Ox 3 , nf    Date              Creat               eGFR  2012- 18         0.69- 0.90            2019- 2020     0.96- 1.13 May 2019         0.98  May 2021       1.10-  2.31       40- >60 ml/min  September 04, 2019  1.63                 46  June 10- 17                            28 > 17 ml/min               Home meds:  - amiodarone 200 bid/ eliquis 5 bid/ lipitor 20/ lasix 40-80 qd/ metoprolol 50 bid  - liraglutide 1.8 sq qd/ insulin lantus 51u bid/ insulin lispro 8u tid ac  - percocet prn qid/ gabapentin 600 hs prn  - prn's/ vitamins/ supplements    UA Oct 2018 - negative   UA 08/08/19 - cloudy, 100 prot, few bact, 0-5 rbc, 11-20 wbc   UA 09/10/19 - hazy, large Hb, 30 prot, >50 rbc, 11-20 wbc    Renal US 6/12 - 11.3/ 12.0 cm kidneys, limited exam d/t body habitus, no hydro, relative preservation of cortical thickness   ECHO 02/2019 - LVEF 55-60%, +LVH, mild reduced RV fxn, no valve issues    I/O since admit are 11.2L in and 15.8 L out  = - 4.5 L net    Wt's are down 168kg > 163kd today = - 5kg   BP's have been high since admission    Assessment/ Plan: 1. AoCKD 3 - baseline creat 1.6, eGFR 46 ml/min. Baseline CKD likely due to DM/ HTN.  Here creat 2.5 >> 3.8, in setting of marked vol  overload, hx of RV dysfunction by echo, normal LVEF. Possible cardiorenal element. Get UP/C ratio. Down 8kg here so far, still has a lot of edema.  Lungs are clear. CXR pending.  Cont IV lasix 60 tid.  2. Vol overload - as above 3. HTN - BP's are high , get vol down. Getting norvasc 5 /d. Per primary. Avoid acei /ARB for now.  4. DM2 on insulin 5. Atrial fibrillation - on amio, eliquis  6. LLE cellulitis - sp vanc, on linezolid now 7. Morbid obesity 8. R HF - by last echo 02/2019    Jeffery [name] 09/15/2019, 12:58 PM   Recent Labs  Lab 09/11/19 0558 09/12/19 0358 09/14/19 0356 09/15/19 0403  K 4.6   < > 4.8 4.7  BUN 72*   < > 67* 61*  CREATININE 3.67*   < > 3.80* 3.60*  CALCIUM 8.1*   < > 8.3* 8.5*  PHOS 6.9*  --   --   --   HGB 8.7*   < > 8.4* 8.9*   < > = values in this interval not displayed.   Inpatient medications: . amiodarone  200 mg Oral BID  . amLODipine  5 mg Oral Daily  . apixaban  5 mg Oral BID  . atorvastatin  20 mg Oral Daily  . furosemide  60 mg Intravenous Q8H  . gabapentin  300 mg Oral BID  . insulin aspart  0-15 Units Subcutaneous TID WC  . insulin aspart  0-5 Units Subcutaneous QHS  . insulin glargine  40 Units Subcutaneous BID  . linezolid  600 mg Oral Q12H  . sodium bicarbonate  650 mg Oral TID    acetaminophen **OR** acetaminophen, bisacodyl, morphine injection, ondansetron **OR** ondansetron (ZOFRAN) IV, oxyCODONE-acetaminophen **AND** oxyCODONE, polyethylene glycol, senna-docusate

## 2019-09-15 NOTE — TOC Progression Note (Signed)
Transition of Care [facility]) - Progression Note    Patient Details  Name: Jeffery [name] MRN: [id number] Date of Birth: 11-04-1960  Transition of Care [facility]) CM/SW [place], [place] Phone Number: 09/15/2019, 3:15 PM  Clinical Narrative:  Chantelle asked for signature on form re: patient's bank account.  Mr Kirby is reluctant to sign, for several reasons.  First, he is not sure he wants them to know his information if he is not guaranteed of going there.  Second, he is hopeful that if he continues to progress at his current rate, he will be able to go home with [facility] PT, RN.  Third, he has an appointment at pain clinic in 2 weeks that he does not want to miss, and he knows he has to commit to 30 days.  Patent did not sign. I left form with him and put a copy in the shadow chart.  In the meantime, Tammi with [facility] has texted me every day saying they are still looking at home for possible bed offer. TOC will continue to follow during the course of hospitalization.      Expected Discharge Plan:  (Rehab v [facility]) Barriers to Discharge: Other (comment) (Patient not willing to make decison yet about disposition)  Expected Discharge Plan and Services Expected Discharge Plan:  (Rehab v [facility])                                               Social Determinants of Health (SDOH) Interventions    Readmission Risk Interventions No flowsheet data found.

## 2019-09-15 NOTE — Telephone Encounter (Signed)
Prior auth for Victoza was approved on Medicaid thru 09/09/20

## 2019-09-15 NOTE — Telephone Encounter (Signed)
I have put in order in Epic for the nurse to change the bandage although there is one in there. Can you please call him and let him know? If needed can you please have Dr. Posey Pronto go see him today? Thanks.

## 2019-09-15 NOTE — Progress Notes (Signed)
Occupational Therapy Treatment Patient Details Name: Jeffery [name] MRN: [id number] DOB: 10-12-1960 Today's Date: 09/15/2019    History of present illness 59 y.o. male with medical history significant of Obesity, DM2, HTN, P Afib, EtOH use, Charcot's foot, Chronic Pain syndrome, Hx of Cardioversion, diastolic CHF, chronic LE blister and edema s/p excision by podiatry comes with worsening of left lower ext foot pain   OT comments  Patient has been progressing well with acute OT goals. Pt expressing would rather D/C home vs SNF. Did discuss concerns with patient regarding steps into house and that patient has steps to his main living area, a goal he should address with PT. Also recommend a bariatric commode for patient's living quarters as he lives in basement level without a bathroom. Patient primarily supervision with bathing, dressing, g/h at sink with min cues for safety with body mechanics during sit to stand.    Follow Up Recommendations  Home health OT;SNF (patient progressing well, does have steps at home)    Equipment Recommendations  3 in 1 bedside commode;Other (comment) (bariatric )       Precautions / Restrictions Precautions Precautions: Fall Restrictions Weight Bearing Restrictions: No RLE Weight Bearing: Weight bearing as tolerated LLE Weight Bearing: Weight bearing as tolerated       Mobility Bed Mobility Overal bed mobility: Modified Independent Bed Mobility: Sit to Supine       Sit to supine: Modified independent (Device/Increase time)   General bed mobility comments: pt requires increased time, no assist.  Transfers Overall transfer level: Needs assistance Equipment used: Rolling walker (2 wheeled) Transfers: Sit to/from Stand Sit to Stand: Supervision;From elevated surface         General transfer comment: cues for hand placement, bed height elevated.     Balance Overall balance assessment: Needs assistance Sitting-balance support: Feet  supported Sitting balance-Leahy Scale: Good     Standing balance support: Bilateral upper extremity supported;During functional activity Standing balance-Leahy Scale: Fair Standing balance comment: pt can maintain standing at sink without support, increased stability with dynamic balance using walker                           ADL either performed or assessed with clinical judgement   ADL Overall ADL's : Needs assistance/impaired     Grooming: Set up;Oral care;Wash/dry face;Standing;Brushing hair (shaving)   Upper Body Bathing: Supervision/ safety;Standing Upper Body Bathing Details (indicate cue type and reason): at sink side, no loss of balance Lower Body Bathing: Supervison/ safety;Sit to/from stand Lower Body Bathing Details (indicate cue type and reason): to wash peri area while standing sink side Upper Body Dressing : Set up;Sitting   Lower Body Dressing: Supervision/safety;Sit to/from stand;Sitting/lateral leans Lower Body Dressing Details (indicate cue type and reason): to don clean shorts  Toilet Transfer: Copy Details (indicate cue type and reason): with sit to stand from edge of bed with bed height elevated         Functional mobility during ADLs: Supervision/safety;Rolling walker General ADL Comments: patient has shown improvements in functional mobility, ambulation and self care. does require increase time to complete all bathing/dressing/grooming with seated rest break at edge of bed before resuming self care tasks.                Cognition Arousal/Alertness: Awake/alert Behavior During Therapy: WFL for tasks assessed/performed Overall Cognitive Status: Within Functional Limits for tasks assessed  Pertinent Vitals/ Pain       Pain Assessment: Faces Faces Pain Scale: Hurts little more Pain Location: Lt knee Pain Descriptors / Indicators:  Aching;Discomfort;Sore Pain Intervention(s): Monitored during session         Frequency  Min 2X/week        Progress Toward Goals  OT Goals(current goals can now be found in the care plan section)  Progress towards OT goals: Progressing toward goals  Acute Rehab OT Goals Patient Stated Goal: go home OT Goal Formulation: With patient Time For Goal Achievement: 09/22/19 Potential to Achieve Goals: Good ADL Goals Pt Will Perform Upper Body Dressing: Independently;sitting Pt Will Perform Lower Body Dressing: with modified independence;sit to/from stand;sitting/lateral leans Pt Will Transfer to Toilet: with modified independence;ambulating (walker, bariatric commode) Pt Will Perform Toileting - Clothing Manipulation and hygiene: sitting/lateral leans;sit to/from stand;with modified independence  Plan Discharge plan remains appropriate       AM-PAC OT "6 Clicks" Daily Activity     Outcome Measure   Help from another person eating meals?: None Help from another person taking care of personal grooming?: None Help from another person toileting, which includes using toliet, bedpan, or urinal?: A Little Help from another person bathing (including washing, rinsing, drying)?: A Little Help from another person to put on and taking off regular upper body clothing?: A Little Help from another person to put on and taking off regular lower body clothing?: A Little 6 Click Score: 20    End of Session Equipment Utilized During Treatment: Rolling walker  OT Visit Diagnosis: Other abnormalities of gait and mobility (R26.89);Muscle weakness (generalized) (M62.81);Pain Pain - Right/Left: Left Pain - part of body: Knee   Activity Tolerance Patient tolerated treatment well   Patient Left in bed;with call bell/phone within reach;with bed alarm set   Nurse Communication Mobility status        Time: 8101-7510 OT Time Calculation (min): 38 min  Charges: OT General Charges $OT Visit: 1  Visit OT Treatments $Self Care/Home Management : 38-52 mins  Delbert Phenix OT Pager: [place] 09/15/2019, 12:18 PM

## 2019-09-15 NOTE — Progress Notes (Signed)
PROGRESS NOTE    Jeffery [name]  YKD:[id number] DOB: Jan 17, 1961 DOA: 09/07/2019 PCP: [name], MD   Brief Narrative:  59 y.o. male with medical history significant of Obesity, DM2, HTN, P Afib, EtOH use, Charcot's foot, Chronic Pain syndrome, Hx of Cardioversion, diastolic CHF, chronic LE blister and edema s/p excision by podiatry comes with worsening of left lower ext foot pain.  Admitted for sepsis secondary to purulent left lower extremity cellulitis.  Hospital course complicated by AKI.  MRI left leg suggested extensive cellulitis with moderate left knee effusion.  Orthopedic consulted who aspirated left knee fluid suggestive of inflammatory fluid.  Getting Zyvox for his cellulitis t.  Nephrology following.  Getting diuretics.   Assessment & Plan:   Principal Problem:   Cellulitis of left lower extremity Active Problems:   Charcot foot due to diabetes mellitus ([place])   Essential hypertension   Uncontrolled type 2 diabetes mellitus with diabetic polyneuropathy, without long-term current use of insulin ([facility])   Paroxysmal atrial fibrillation ([facility])   Chronic anticoagulation   AKI (acute kidney injury) ([place])   Left leg cellulitis   Sepsis ([place])   Acute pain of left knee  Sepsis secondary to left Lower extremity Cellulitis with some purulent; POA B/L lower extremity chronic skin changes with blister -Sepsis physiology improved. -UA-possible mild UTI?,  Blood cultures negative -Initially on IV vancomycin, now on Zyvox.  No clear elastase , Clinically monitor his cellulitis and determine the course. -Pain control, bowel regimen -Ultrasound ABI-difficult to obtain due to blisters -MRI-left lower extremity cellulitis but no evidence of abscess/osteomyelitis -Daily dressing changes-Xeroform, Kerlix, Ace.  Left shoulder and left ankle pain, improved Acute mild gout flare Moderate left knee effusion-inflammatory versus infectious -X-ray showed chronic changes.  MRI showed  soft tissue inflammation concerning for cellulitis with moderate left knee effusion -Seen by Ortho status post knee aspiration 6/14 suggestive of inflammatory fluid.  Received colchicine doing better. -Pain control -Continue work with PT/OT-SNF.  TOC team working on this  Acute Kidney Injury on CKD Stage IIIa, worsening -Baseline creatinine 1.6, admission creatinine 2.4.  Creatinine this morning 3.6.  Seen by nephrology -On Lasix 60 mg IV every 8 hours. -Periodic bladder scan to ensure he is not having any urinary retention. -Renal ultrasound-negative  Sinus bradycardia -Continue amiodarone.  Discontinue metoprolol for now.  Essential Hypertension  -As needed hydralazine if necessary. Norvasc 5mg  po daily. Lasix IV q8hrs for now.   DM2 with neuropathy , controlled -Insulin sliding scale and Accu-Chek.  Lantus 50 units twice daily -Gabapentin -Diabetic diet  Paroxysmal A Fib, s/p Cardioversion -On  amiodarone and Eliquis  PT-recommending SNF  DVT prophylaxis: Eliquis Code Status: Full code Family Communication: None  Status is: Inpatient  Remains inpatient appropriate because:IV treatments appropriate due to intensity of illness or inability to take PO   Dispo: The patient is from: Home              Anticipated d/c is to: SNF              Anticipated d/c date is: 1-2 days              Patient currently is not medically stable to d/c.  Transition to SNF once cleared by nephrology.  In the meantime continue IV diuretics and monitor renal function  Subjective: Lower extremity swelling and pain is much better.  No new complaints  Review of Systems Otherwise negative except as per HPI, including: General: Denies fever, chills, night sweats or  unintended weight loss. Resp: Denies cough, wheezing, shortness of breath. Cardiac: Denies chest pain, palpitations, orthopnea, paroxysmal nocturnal dyspnea. GI: Denies abdominal pain, nausea, vomiting, diarrhea or  constipation GU: Denies dysuria, frequency, hesitancy or incontinence MS: Denies muscle aches, joint pain or swelling Neuro: Denies headache, neurologic deficits (focal weakness, numbness, tingling), abnormal gait Psych: Denies anxiety, depression, SI/HI/AVH Skin: Denies new rashes or lesions ID: Denies sick contacts, exotic exposures, travel  Examination: Constitutional: Not in acute distress Respiratory: Clear to auscultation bilaterally Cardiovascular: Normal sinus rhythm, no rubs Abdomen: Nontender nondistended good bowel sounds Musculoskeletal: No edema noted Skin: Bilateral lower extremity dressing in place Neurologic: CN 2-12 grossly intact.  And nonfocal Psychiatric: Normal judgment and insight. Alert and oriented x 3. Normal mood.  Objective: Vitals:   09/14/19 1415 09/14/19 2105 09/15/19 0418 09/15/19 0458  BP: (!) 160/81 (!) 142/62 (!) 153/77   Pulse: 70 68 70   Resp: 20 16 16    Temp: 97.7 F (36.5 C) 97.7 F (36.5 C) 97.9 F (36.6 C)   TempSrc: Oral     SpO2: 97% 95% 97%   Weight:    (!) 159.4 kg  Height:        Intake/Output Summary (Last 24 hours) at 09/15/2019 0834 Last data filed at 09/15/2019 0600 Gross per 24 hour  Intake 1062 ml  Output 3475 ml  Net -2413 ml   Filed Weights   09/13/19 0500 09/14/19 0500 09/15/19 0458  Weight: (!) 165.8 kg (!) 162.7 kg (!) 159.4 kg     Data Reviewed:   CBC: Recent Labs  Lab 09/11/19 0558 09/12/19 0358 09/13/19 0339 09/14/19 0356 09/15/19 0403  WBC 14.0* 13.2* 14.5* 12.1* 12.4*  HGB 8.7* 8.0* 9.4* 8.4* 8.9*  HCT 29.4* 26.7* 31.9* 28.0* 29.8*  MCV 91.6 89.6 91.1 90.0 89.2  PLT 474* 427* 484* 438* 725*   Basic Metabolic Panel: Recent Labs  Lab 09/10/19 0652 09/10/19 0652 09/11/19 0558 09/12/19 0358 09/13/19 0339 09/14/19 0356 09/15/19 0403  NA 135  --  136  --  137 137 137  K 4.9  --  4.6  --  4.6 4.8 4.7  CL 103  --  104  --  104 103 104  CO2 20*  --  20*  --  21* 24 23  GLUCOSE 84  --  113*   --  123* 112* 140*  BUN 71*  --  72*  --  66* 67* 61*  CREATININE 3.60*   < > 3.67* 3.86* 3.36* 3.80* 3.60*  CALCIUM 8.4*  --  8.1*  --  8.2* 8.3* 8.5*  MG 2.6*   < > 2.7* 2.7* 2.6* 2.3 2.2  PHOS  --   --  6.9*  --   --   --   --    < > = values in this interval not displayed.   GFR: Estimated Creatinine Clearance: 36.2 mL/min (A) (by C-G formula based on SCr of 3.6 mg/dL (H)). Liver Function Tests: Recent Labs  Lab 09/09/19 0620 09/10/19 0652 09/11/19 0558  AST 52* 91*  --   ALT 40 66*  --   ALKPHOS 78 77  --   BILITOT 0.8 0.6  --   PROT 7.2 6.8  --   ALBUMIN 2.1* 2.0* 1.9*   No results for input(s): LIPASE, AMYLASE in the last 168 hours. No results for input(s): AMMONIA in the last 168 hours. Coagulation Profile: No results for input(s): INR, PROTIME in the last 168 hours. Cardiac Enzymes: No results  for input(s): CKTOTAL, CKMB, CKMBINDEX, TROPONINI in the last 168 hours. BNP (last 3 results) No results for input(s): PROBNP in the last 8760 hours. HbA1C: No results for input(s): HGBA1C in the last 72 hours. CBG: Recent Labs  Lab 09/14/19 0753 09/14/19 1122 09/14/19 1611 09/14/19 2051 09/15/19 0807  GLUCAP 123* 132* 138* 113* 88   Lipid Profile: No results for input(s): CHOL, HDL, LDLCALC, TRIG, CHOLHDL, LDLDIRECT in the last 72 hours. Thyroid Function Tests: No results for input(s): TSH, T4TOTAL, FREET4, T3FREE, THYROIDAB in the last 72 hours. Anemia Panel: No results for input(s): VITAMINB12, FOLATE, FERRITIN, TIBC, IRON, RETICCTPCT in the last 72 hours. Sepsis Labs: No results for input(s): PROCALCITON, LATICACIDVEN in the last 168 hours.  Recent Results (from the past 240 hour(s))  Culture, blood (Routine X 2) w Reflex to ID Panel     Status: None   Collection Time: 09/07/19 12:25 PM   Specimen: BLOOD  Result Value Ref Range Status   Specimen Description   Final    BLOOD RIGHT ANTECUBITAL Performed at [facility] [address] [place], [place] [address]    Special Requests   Final    BOTTLES DRAWN AEROBIC AND ANAEROBIC Blood Culture adequate volume Performed at [facility] [address] [place], [place] [address]    Culture   Final    NO GROWTH 5 DAYS Performed at [facility], [place] [address], [place], [place] [address]    Report Status 09/12/2019 FINAL  Final  SARS Coronavirus 2 by RT PCR (hospital order, performed in [facility] hospital lab) Nasopharyngeal Nasopharyngeal Swab     Status: None   Collection Time: 09/07/19  2:37 PM   Specimen: Nasopharyngeal Swab  Result Value Ref Range Status   SARS Coronavirus 2 NEGATIVE NEGATIVE Final    Comment: (NOTE) SARS-CoV-2 target nucleic acids are NOT DETECTED.  The SARS-CoV-2 RNA is generally detectable in upper and lower respiratory specimens during the acute phase of infection. The lowest concentration of SARS-CoV-2 viral copies this assay can detect is 250 copies / mL. A negative result does not preclude SARS-CoV-2 infection and should not be used as the sole basis for treatment or other patient management decisions.  A negative result may occur with improper specimen collection / handling, submission of specimen other than nasopharyngeal swab, presence of viral mutation(s) within the areas targeted by this assay, and inadequate number of viral copies (<250 copies / mL). A negative result must be combined with clinical observations, patient history, and epidemiological information.  Fact Sheet for Patients:   StrictlyIdeas.no  Fact Sheet for Healthcare Providers: BankingDealers.co.za  This test is not yet approved or  cleared by the [place] FDA and has been authorized for detection and/or diagnosis of SARS-CoV-2 by FDA under an Emergency Use Authorization (EUA).  This EUA will remain in effect (meaning this test can be used) for the duration of the COVID-19 declaration  under Section 564(b)(1) of the Act, 21 U.S.C. section 360bbb-3(b)(1), unless the authorization is terminated or revoked sooner.  Performed at [facility], [place] [address], [place], [place] [address]   Culture, blood (Routine X 2) w Reflex to ID Panel     Status: None   Collection Time: 09/07/19  3:09 PM   Specimen: BLOOD RIGHT HAND  Result Value Ref Range Status   Specimen Description   Final    BLOOD RIGHT HAND Performed at [facility] [address]  [place], [place] [address]    Special Requests   Final    BOTTLES DRAWN AEROBIC ONLY Blood Culture results may not be optimal due to an inadequate volume of blood received in culture bottles Performed at [facility] [address], [place], [place] [address]    Culture   Final    NO GROWTH 5 DAYS Performed at [facility], [place] [address] [place], [place] [address]    Report Status 09/12/2019 FINAL  Final  Body fluid culture     Status: None   Collection Time: 09/11/19  1:47 PM   Specimen: Synovium; Body Fluid  Result Value Ref Range Status   Specimen Description   Final    SYNOVIAL KNEE LEFT Performed at [facility] [address], [place], [place] [address]    Special Requests NONE  Final   Gram Stain   Final    ABUNDANT WBC PRESENT, PREDOMINANTLY PMN NO ORGANISMS SEEN    Culture   Final    NO GROWTH Performed at [facility], [place] [address] [place], [place] [address]    Report Status 09/14/2019 FINAL  Final  SARS CORONAVIRUS 2 (TAT 6-24 HRS) Nasopharyngeal Nasopharyngeal Swab     Status: None   Collection Time: 09/14/19  5:52 PM   Specimen: Nasopharyngeal Swab  Result Value Ref Range Status   SARS Coronavirus 2 NEGATIVE NEGATIVE Final    Comment: (NOTE) SARS-CoV-2 target nucleic acids are NOT DETECTED.  The SARS-CoV-2 RNA is generally detectable in upper and lower respiratory specimens during the acute phase of infection.  Negative results do not preclude SARS-CoV-2 infection, do not rule out co-infections with other pathogens, and should not be used as the sole basis for treatment or other patient management decisions. Negative results must be combined with clinical observations, patient history, and epidemiological information. The expected result is Negative.  Fact Sheet for Patients: SugarRoll.be  Fact Sheet for Healthcare Providers: https://www.woods-mathews.com/  This test is not yet approved or cleared by the [place] FDA and  has been authorized for detection and/or diagnosis of SARS-CoV-2 by FDA under an Emergency Use Authorization (EUA). This EUA will remain  in effect (meaning this test can be used) for the duration of the COVID-19 declaration under Se ction 564(b)(1) of the Act, 21 U.S.C. section 360bbb-3(b)(1), unless the authorization is terminated or revoked sooner.  Performed at [facility], [place] [address], [place], [place] [address]          Radiology Studies: No results found.      Scheduled Meds: . amiodarone  200 mg Oral BID  . apixaban  5 mg Oral BID  . atorvastatin  20 mg Oral Daily  . furosemide  60 mg Intravenous Q8H  . gabapentin  300 mg Oral BID  . insulin aspart  0-15 Units Subcutaneous TID WC  . insulin aspart  0-5 Units Subcutaneous QHS  . insulin glargine  40 Units Subcutaneous BID  . linezolid  600 mg Oral Q12H  . sodium bicarbonate  650 mg Oral TID   Continuous Infusions:    LOS: 8 days   Time spent= 35 mins    Tynasia Mccaul Arsenio Loader, MD [facility]  If 7PM-7AM, please contact night-coverage  09/15/2019, 8:34 AM

## 2019-09-16 DIAGNOSIS — E1165 Type 2 diabetes mellitus with hyperglycemia: Secondary | ICD-10-CM

## 2019-09-16 DIAGNOSIS — E1142 Type 2 diabetes mellitus with diabetic polyneuropathy: Secondary | ICD-10-CM

## 2019-09-16 LAB — BASIC METABOLIC PANEL
Anion gap: 7 (ref 5–15)
BUN: 55 mg/dL — ABNORMAL HIGH (ref 6–20)
CO2: 27 mmol/L (ref 22–32)
Calcium: 8.4 mg/dL — ABNORMAL LOW (ref 8.9–10.3)
Chloride: 100 mmol/L (ref 98–111)
Creatinine, Ser: 3.52 mg/dL — ABNORMAL HIGH (ref 0.61–1.24)
GFR calc Af Amer: 21 mL/min — ABNORMAL LOW (ref >60)
GFR calc non Af Amer: 18 mL/min — ABNORMAL LOW (ref >60)
Glucose, Bld: 133 mg/dL — ABNORMAL HIGH (ref 70–99)
Potassium: 4.6 mmol/L (ref 3.5–5.1)
Sodium: 134 mmol/L — ABNORMAL LOW (ref 135–145)

## 2019-09-16 LAB — CBC
HCT: 29.7 % — ABNORMAL LOW (ref 39.0–52.0)
Hemoglobin: 8.9 g/dL — ABNORMAL LOW (ref 13.0–17.0)
MCH: 27 pg (ref 26.0–34.0)
MCHC: 30 g/dL (ref 30.0–36.0)
MCV: 90 fL (ref 80.0–100.0)
Platelets: 452 10*3/uL — ABNORMAL HIGH (ref 150–400)
RBC: 3.3 MIL/uL — ABNORMAL LOW (ref 4.22–5.81)
RDW: 15.7 % — ABNORMAL HIGH (ref 11.5–15.5)
WBC: 11.8 10*3/uL — ABNORMAL HIGH (ref 4.0–10.5)
nRBC: 0 % (ref 0.0–0.2)

## 2019-09-16 LAB — GLUCOSE, CAPILLARY
Glucose-Capillary: 132 mg/dL — ABNORMAL HIGH (ref 70–99)
Glucose-Capillary: 138 mg/dL — ABNORMAL HIGH (ref 70–99)
Glucose-Capillary: 148 mg/dL — ABNORMAL HIGH (ref 70–99)
Glucose-Capillary: 148 mg/dL — ABNORMAL HIGH (ref 70–99)

## 2019-09-16 LAB — MAGNESIUM: Magnesium: 2.2 mg/dL (ref 1.7–2.4)

## 2019-09-16 LAB — PROTEIN / CREATININE RATIO, URINE
Creatinine, Urine: 42.63 mg/dL
Protein Creatinine Ratio: 0.82 mg/mg{Cre} — ABNORMAL HIGH (ref 0.00–0.15)
Total Protein, Urine: 35 mg/dL

## 2019-09-16 MED ORDER — AMLODIPINE BESYLATE 5 MG PO TABS
5.0000 mg | ORAL_TABLET | Freq: Two times a day (BID) | ORAL | Status: DC
  Administered 2019-09-16: 5 mg via ORAL
  Filled 2019-09-16: qty 1

## 2019-09-16 MED ORDER — AMLODIPINE BESYLATE 5 MG PO TABS: 5.0000 mg | ORAL_TABLET | Freq: Every day | ORAL | Status: DC

## 2019-09-16 NOTE — Progress Notes (Signed)
PT Cancellation Note  Patient Details Name: Jeffery [name] MRN: [id number] DOB: 1960-06-09   Cancelled Treatment:    Reason Eval/Treat Not Completed: Attempted PT tx session. Pt stated he didn't feel well-nauseous. Then he vomited shortly thereafter. Made Rn aware. Will check back another day.    [facility]  Office: [phone] Pager: [phone]

## 2019-09-16 NOTE — Progress Notes (Signed)
PROGRESS NOTE    Jeffery [name]   STM:[id number]  DOB: 12/19/1960  PCP: [name], MD    DOA: 09/07/2019 LOS: 9   Brief Narrative   59 y.o.malewith medical history significant ofObesity, DM2, HTN, P Afib, EtOH use, Charcot's foot, Chronic Pain syndrome, Hx of Cardioversion, diastolic CHF, chronic LE blister and edema s/p excision by podiatry comes with worsening of left lower ext foot pain.  Admitted for sepsis secondary to purulent left lower extremity cellulitis.  Hospital course complicated by AKI.  MRI left leg suggested extensive cellulitis with moderate left knee effusion.  Orthopedic consulted who aspirated left knee fluid suggestive of inflammatory fluid.  Getting Zyvox for his cellulitis t.  Nephrology following.  Getting diuretics.   Assessment & Plan   Principal Problem:   Cellulitis of left lower extremity Active Problems:   Charcot foot due to diabetes mellitus ([place])   Essential hypertension   Uncontrolled type 2 diabetes mellitus with diabetic polyneuropathy, without long-term current use of insulin (H[facility])   Paroxysmal atrial fibrillation ([facility])   Chronic anticoagulation   AKI (acute kidney injury) ([place])   Left leg cellulitis   Sepsis ([place])   Acute pain of left knee   Sepsis secondary to left Lower extremity Cellulitis with some purulent; POA B/L lower extremity chronic skin changes with blister -Sepsis physiology improved. -UA-possible mild UTI?,  Blood cultures negative -Initially on IV vancomycin, now on Zyvox.  No clear elastase , Clinically monitor his cellulitis and determine the course. -Pain control, bowel regimen -Ultrasound ABI-difficult to obtain due to blisters -MRI-left lower extremity cellulitis but no evidence of abscess/osteomyelitis -Daily dressing changes-Xeroform, Kerlix, Ace.  Left shoulder and left ankle pain, improved Acute mild gout flare Moderate left knee effusion-inflammatory versus infectious -X-ray showed chronic  changes.  MRI showed soft tissue inflammation concerning for cellulitis with moderate left knee effusion -Seen by Ortho status post knee aspiration 6/14 suggestive of inflammatory fluid.  Received colchicine doing better. -Pain control -Continue work with PT/OT-SNF.  TOC team working on this  Acute Kidney Injury on CKD Stage IIIa, worsening -Baseline creatinine 1.6, admission creatinine 2.4.  Creatinine this morning 3.6.  Seen by nephrology -On Lasix 60 mg IV every 8 hours. -Periodic bladder scan to ensure he is not having any urinary retention. -Renal ultrasound-negative  Sinus bradycardia -Continue amiodarone.  Discontinue metoprolol for now.  Essential Hypertension -As needed hydralazine if necessary. Norvasc 5mg  po daily. Lasix IV q8hrs for now.   DM2 with neuropathy, controlled -Insulin sliding scale and Accu-Chek. Lantus 50 units twice daily -Gabapentin -Diabetic diet  ParoxysmalA Fib, s/p Cardioversion -On  amiodarone and Eliquis  Morbid Obesity: Body mass index is 45.71 kg/m.  This complicates overall care and prognosis.  PT-recommending SNF  DVT prophylaxis:  apixaban (ELIQUIS) tablet 5 mg   Diet:  Diet Orders (From admission, onward)    Start     Ordered   09/11/19 0903  Diet Carb Modified Fluid consistency: Thin; Room service appropriate? Yes  Diet effective now       Question Answer Comment  Diet-HS Snack? Nothing   Calorie Level Medium 1600-2000   Fluid consistency: Thin   Room service appropriate? Yes      09/11/19 0903            Code Status: Full Code    Subjective 09/16/19    Patient taking a nap but awoke easily.  Reports poor sleep overnight.  Denies fever/chills, chest pain, SOB, N/V/D.  He does report constipation.  Last BM was before admission.  Reports his leg swelling getting much better.  Denies leg/foot pain currently.  Lower back hurts from laying in bed, chair no better.  Disposition Plan & Communication   Status is:  Inpatient  Remains inpatient appropriate because:IV treatments appropriate due to intensity of illness or inability to take PO and Inpatient level of care appropriate due to severity of illness   Dispo: The patient is from: Home              Anticipated d/c is to: SNF              Anticipated d/c date is: 3 days              Patient currently is not medically stable to d/c.    Family Communication: none at bedside will attempt to call    Consults, Procedures, Significant Events   Consultants:   nephrology   Antimicrobials:   Zyvox   Objective   Vitals:   09/15/19 1545 09/15/19 2047 09/16/19 0458 09/16/19 0500  BP: (!) 162/76 (!) 189/80 (!) 149/78   Pulse: 70 82 75   Resp: 14 16 16    Temp: (!) 97.5 F (36.4 C) 97.6 F (36.4 C) (!) 97.5 F (36.4 C)   TempSrc: Oral     SpO2: 94% 96% 95%   Weight:    (!) 161.6 kg  Height:        Intake/Output Summary (Last 24 hours) at 09/16/2019 0742 Last data filed at 09/16/2019 0500 Gross per 24 hour  Intake 946 ml  Output 3275 ml  Net -2329 ml   Filed Weights   09/14/19 0500 09/15/19 0458 09/16/19 0500  Weight: (!) 162.7 kg (!) 159.4 kg (!) 161.6 kg    Physical Exam:  General exam: awake, alert, no acute distress, morbidly obese HEENT: atraumatic, clear conjunctiva, anicteric sclera, moist mucus membranes, hearing grossly normal  Respiratory system: CTAB but diminished due to body habitus, no wheezes, rales or rhonchi, normal respiratory effort. Cardiovascular system: normal S1/S2,  RRR, pitting edema of b/l LE's to just below knee.   Gastrointestinal system: soft, NT, ND, +bowel sounds Central nervous system: A&O x4. no gross focal neurologic deficits, normal speech Extremities: b/l legs in wraps, pitting edema to proximal shins bilaterally, no warmth or tenderness proximal to dressings Psychiatry: normal mood, congruent affect, judgement and insight appear normal  Labs   Data Reviewed: I have personally reviewed  following labs and imaging studies  CBC: Recent Labs  Lab 09/12/19 0358 09/13/19 0339 09/14/19 0356 09/15/19 0403 09/16/19 0412  WBC 13.2* 14.5* 12.1* 12.4* 11.8*  HGB 8.0* 9.4* 8.4* 8.9* 8.9*  HCT 26.7* 31.9* 28.0* 29.8* 29.7*  MCV 89.6 91.1 90.0 89.2 90.0  PLT 427* 484* 438* 468* 712*   Basic Metabolic Panel: Recent Labs  Lab 09/11/19 0558 09/11/19 0558 09/12/19 0358 09/13/19 0339 09/14/19 0356 09/15/19 0403 09/16/19 0412  NA 136  --   --  137 137 137 134*  K 4.6  --   --  4.6 4.8 4.7 4.6  CL 104  --   --  104 103 104 100  CO2 20*  --   --  21* 24 23 27   GLUCOSE 113*  --   --  123* 112* 140* 133*  BUN 72*  --   --  66* 67* 61* 55*  CREATININE 3.67*   < > 3.86* 3.36* 3.80* 3.60* 3.52*  CALCIUM 8.1*  --   --  8.2*  8.3* 8.5* 8.4*  MG 2.7*   < > 2.7* 2.6* 2.3 2.2 2.2  PHOS 6.9*  --   --   --   --   --   --    < > = values in this interval not displayed.   GFR: Estimated Creatinine Clearance: 37.3 mL/min (A) (by C-G formula based on SCr of 3.52 mg/dL (H)). Liver Function Tests: Recent Labs  Lab 09/10/19 0652 09/11/19 0558  AST 91*  --   ALT 66*  --   ALKPHOS 77  --   BILITOT 0.6  --   PROT 6.8  --   ALBUMIN 2.0* 1.9*   No results for input(s): LIPASE, AMYLASE in the last 168 hours. No results for input(s): AMMONIA in the last 168 hours. Coagulation Profile: No results for input(s): INR, PROTIME in the last 168 hours. Cardiac Enzymes: No results for input(s): CKTOTAL, CKMB, CKMBINDEX, TROPONINI in the last 168 hours. BNP (last 3 results) No results for input(s): PROBNP in the last 8760 hours. HbA1C: No results for input(s): HGBA1C in the last 72 hours. CBG: Recent Labs  Lab 09/15/19 0807 09/15/19 1140 09/15/19 1546 09/15/19 2048 09/16/19 0730  GLUCAP 88 161* 123* 134* 132*   Lipid Profile: No results for input(s): CHOL, HDL, LDLCALC, TRIG, CHOLHDL, LDLDIRECT in the last 72 hours. Thyroid Function Tests: No results for input(s): TSH, T4TOTAL,  FREET4, T3FREE, THYROIDAB in the last 72 hours. Anemia Panel: No results for input(s): VITAMINB12, FOLATE, FERRITIN, TIBC, IRON, RETICCTPCT in the last 72 hours. Sepsis Labs: No results for input(s): PROCALCITON, LATICACIDVEN in the last 168 hours.  Recent Results (from the past 240 hour(s))  Culture, blood (Routine X 2) w Reflex to ID Panel     Status: None   Collection Time: 09/07/19 12:25 PM   Specimen: BLOOD  Result Value Ref Range Status   Specimen Description   Final    BLOOD RIGHT ANTECUBITAL Performed at [facility] [address], [place], [place] [address]    Special Requests   Final    BOTTLES DRAWN AEROBIC AND ANAEROBIC Blood Culture adequate volume Performed at [facility] [address] [place], [place] [address]    Culture   Final    NO GROWTH 5 DAYS Performed at [facility], [place] [address] [place], [place] [address]    Report Status 09/12/2019 FINAL  Final  SARS Coronavirus 2 by RT PCR (hospital order, performed in [facility] hospital lab) Nasopharyngeal Nasopharyngeal Swab     Status: None   Collection Time: 09/07/19  2:37 PM   Specimen: Nasopharyngeal Swab  Result Value Ref Range Status   SARS Coronavirus 2 NEGATIVE NEGATIVE Final    Comment: (NOTE) SARS-CoV-2 target nucleic acids are NOT DETECTED.  The SARS-CoV-2 RNA is generally detectable in upper and lower respiratory specimens during the acute phase of infection. The lowest concentration of SARS-CoV-2 viral copies this assay can detect is 250 copies / mL. A negative result does not preclude SARS-CoV-2 infection and should not be used as the sole basis for treatment or other patient management decisions.  A negative result may occur with improper specimen collection / handling, submission of specimen other than nasopharyngeal swab, presence of viral mutation(s) within the areas targeted by this assay, and inadequate number of viral copies (<250 copies /  mL). A negative result must be combined with clinical observations, patient history, and epidemiological information.  Fact Sheet for Patients:   StrictlyIdeas.no  Fact  Sheet for Healthcare Providers: BankingDealers.co.za  This test is not yet approved or  cleared by the [place] and has been authorized for detection and/or diagnosis of SARS-CoV-2 by FDA under an Emergency Use Authorization (EUA).  This EUA will remain in effect (meaning this test can be used) for the duration of the COVID-19 declaration under Section 564(b)(1) of the Act, 21 U.S.C. section 360bbb-3(b)(1), unless the authorization is terminated or revoked sooner.  Performed at [facility], [place] [address] [place], [place] [address]   Culture, blood (Routine X 2) w Reflex to ID Panel     Status: None   Collection Time: 09/07/19  3:09 PM   Specimen: BLOOD RIGHT HAND  Result Value Ref Range Status   Specimen Description   Final    BLOOD RIGHT HAND Performed at [facility] [address] [place], [place] [address]    Special Requests   Final    BOTTLES DRAWN AEROBIC ONLY Blood Culture results may not be optimal due to an inadequate volume of blood received in culture bottles Performed at [facility] [address], [place], [place] [address]    Culture   Final    NO GROWTH 5 DAYS Performed at [facility], [place] [address] [place], [place] [address]    Report Status 09/12/2019 FINAL  Final  Body fluid culture     Status: None   Collection Time: 09/11/19  1:47 PM   Specimen: Synovium; Body Fluid  Result Value Ref Range Status   Specimen Description   Final    SYNOVIAL KNEE LEFT Performed at [facility] [address], [place], [place] [address]    Special Requests NONE  Final   Gram Stain   Final    ABUNDANT WBC PRESENT, PREDOMINANTLY PMN NO ORGANISMS SEEN     Culture   Final    NO GROWTH Performed at [facility], [place] [address] [place], [place] [address]    Report Status 09/14/2019 FINAL  Final  SARS CORONAVIRUS 2 (TAT 6-24 HRS) Nasopharyngeal Nasopharyngeal Swab     Status: None   Collection Time: 09/14/19  5:52 PM   Specimen: Nasopharyngeal Swab  Result Value Ref Range Status   SARS Coronavirus 2 NEGATIVE NEGATIVE Final    Comment: (NOTE) SARS-CoV-2 target nucleic acids are NOT DETECTED.  The SARS-CoV-2 RNA is generally detectable in upper and lower respiratory specimens during the acute phase of infection. Negative results do not preclude SARS-CoV-2 infection, do not rule out co-infections with other pathogens, and should not be used as the sole basis for treatment or other patient management decisions. Negative results must be combined with clinical observations, patient history, and epidemiological information. The expected result is Negative.  Fact Sheet for Patients: SugarRoll.be  Fact Sheet for Healthcare Providers: https://www.woods-mathews.com/  This test is not yet approved or cleared by the [place] FDA and  has been authorized for detection and/or diagnosis of SARS-CoV-2 by FDA under an Emergency Use Authorization (EUA). This EUA will remain  in effect (meaning this test can be used) for the duration of the COVID-19 declaration under Se ction 564(b)(1) of the Act, 21 U.S.C. section 360bbb-3(b)(1), unless the authorization is terminated or revoked sooner.  Performed at [facility], [place] [address] [place], [place] [address]       Imaging Studies   DG Chest 2 View  Result Date: 09/15/2019 CLINICAL DATA:  Volume overload EXAM: CHEST -  2 VIEW COMPARISON:  Multiple priors, most recent 09/07/2019 FINDINGS: Prominent, cephalized vascularity compatible with vascular congestion. Lung volumes are low with streaky opacities in the bases favoring atelectasis. Few  septal lines are present with some central vascular cuffing. Stable cardiomediastinal contours from prior without frank cardiomegaly on this portable exam. No pneumothorax or effusion. No acute osseous or soft tissue abnormality. Degenerative changes are present in the imaged spine and shoulders. Prior right shoulder rotator cuff repair. Telemetry leads overlie the chest. IMPRESSION: 1. Vascular congestion. Subtle features of at most mild interstitial edema. No effusions. 2. Low lung volumes with streaky opacities in the bases favoring atelectasis. Electronically Signed   By: Lovena Le M.D.   On: 09/15/2019 16:21     Medications   Scheduled Meds: . amiodarone  200 mg Oral BID  . amLODipine  5 mg Oral Daily  . apixaban  5 mg Oral BID  . atorvastatin  20 mg Oral Daily  . furosemide  60 mg Intravenous Q8H  . gabapentin  300 mg Oral BID  . insulin aspart  0-15 Units Subcutaneous TID WC  . insulin aspart  0-5 Units Subcutaneous QHS  . insulin glargine  40 Units Subcutaneous BID  . linezolid  600 mg Oral Q12H  . polyethylene glycol  17 g Oral Daily  . senna-docusate  1 tablet Oral BID  . sodium bicarbonate  650 mg Oral TID   Continuous Infusions:     LOS: 9 days    Time spent: 25 minutes    Ezekiel Slocumb, DO [facility]  09/16/2019, 7:42 AM    If 7PM-7AM, please contact night-coverage. How to contact the [facility] Attending or Consulting provider [place] or covering provider during after hours [place], for this patient?    1. Check the care team in [facility] and look for a) attending/consulting TR[facility] provider listed and b) the [facility] team listed 2. Log into [email or web address] and use [place]'s universal password to access. If you do not have the password, please contact the hospital operator. 3. Locate the [facility] provider you are looking for under [facility] and page to a number that you can be directly reached. 4. If you still have difficulty reaching the provider, please page  the [facility] (Director on Call) for the Hospitalists listed on amion for assistance.

## 2019-09-16 NOTE — Progress Notes (Signed)
[facility] Progress Note  Subjective: 3.4 L UOP yest, 2 L out today so far   Vitals:   09/15/19 2047 09/16/19 0458 09/16/19 0500 09/16/19 1418  BP: (!) 189/80 (!) 149/78  (!) 164/66  Pulse: 82 75  72  Resp: _0 Temp: 97.6 F (36.4 C) (!) 97.5 F (36.4 C)  (!) 97.5 F (36.4 C)  TempSrc:    Oral  SpO2: 96% 95%  100%  Weight:   (!) 161.6 kg   Height:        Exam: Gen alert, no distress No rash, cyanosis or gangrene Sclera anicteric, throat clear  No jvd or bruits Chest clear bilat to bases no rales, wheezing or bronchial BS RRR no MRG Abd soft ntnd no mass or ascites +bs GU normal male MS no joint effusions or deformity Ext diffuse 2-3+ LE and smaller UE edema Neuro is alert, Ox 3 , nf    Date              Creat               eGFR  2012- 18         0.69- 0.90            2019- 2020     0.96- 1.13 May 2019         0.98  May 2021       1.10-  2.31       40- >60 ml/min  September 04, 2019  1.63                 46  June 10- 17                            28 > 17 ml/min               Home meds:  - amiodarone 200 bid/ eliquis 5 bid/ lipitor 20/ lasix 40-80 qd/ metoprolol 50 bid  - liraglutide 1.8 sq qd/ insulin lantus 51u bid/ insulin lispro 8u tid ac  - percocet prn qid/ gabapentin 600 hs prn  - prn's/ vitamins/ supplements    UA Oct 2018 - negative   UA 08/08/19 - cloudy, 100 prot, few bact, 0-5 rbc, 11-20 wbc   UA 09/10/19 - hazy, large Hb, 30 prot, >50 rbc, 11-20 wbc    Renal US 6/12 - 11.3/ 12.0 cm kidneys, limited exam d/t body habitus, no hydro, relative preservation of cortical thickness   ECHO 02/2019 - LVEF 55-60%, +LVH, mild reduced RV fxn, no valve issues    I/O since admit are 11.2L in and 15.8 L out  = - 4.5 L net    Wt's are down 168kg > 163kd today = - 5kg   BP's have been high since admission    Assessment/ Plan: 1. AoCKD 3 - baseline creat 1.6, eGFR 46 ml/min. Baseline CKD likely due to DM/ HTN.  Here creat 2.5 >> 3.8, in setting  of marked vol overload, hx of RV dysfunction by echo, normal LVEF. Possible cardiorenal element. UA +wbc/ rbc, will repeat. Still has a significant amount of edema.  Lungs are clear. CXR pending.  Cont IV lasix 60 tid.  2. Vol overload - as above 3. HTN - BP's are high , getting vol down w/ IV lasix. Will ^norvasc 5 bid. Avoid acei /ARB for now.  4. DM2 on insulin 5. Atrial fibrillation -  on amio, eliquis 6. LLE cellulitis - sp vanc, on linezolid now 7. Morbid obesity 8. R HF - by last echo 02/2019    Kelly Splinter 09/16/2019, 7:24 PM   Recent Labs  Lab 09/11/19 0558 09/12/19 0358 09/15/19 0403 09/16/19 0412  K 4.6   < > 4.7 4.6  BUN 72*   < > 61* 55*  CREATININE 3.67*   < > 3.60* 3.52*  CALCIUM 8.1*   < > 8.5* 8.4*  PHOS 6.9*  --   --   --   HGB 8.7*   < > 8.9* 8.9*   < > = values in this interval not displayed.   Inpatient medications: . amiodarone  200 mg Oral BID  . amLODipine  5 mg Oral Daily  . apixaban  5 mg Oral BID  . atorvastatin  20 mg Oral Daily  . furosemide  60 mg Intravenous Q8H  . gabapentin  300 mg Oral BID  . insulin aspart  0-15 Units Subcutaneous TID WC  . insulin aspart  0-5 Units Subcutaneous QHS  . insulin glargine  40 Units Subcutaneous BID  . linezolid  600 mg Oral Q12H  . polyethylene glycol  17 g Oral Daily  . senna-docusate  1 tablet Oral BID  . sodium bicarbonate  650 mg Oral TID    acetaminophen **OR** acetaminophen, bisacodyl, morphine injection, ondansetron **OR** ondansetron (ZOFRAN) IV, oxyCODONE-acetaminophen **AND** oxyCODONE

## 2019-09-17 LAB — BASIC METABOLIC PANEL
Anion gap: 6 (ref 5–15)
BUN: 51 mg/dL — ABNORMAL HIGH (ref 6–20)
CO2: 25 mmol/L (ref 22–32)
Calcium: 8.4 mg/dL — ABNORMAL LOW (ref 8.9–10.3)
Chloride: 102 mmol/L (ref 98–111)
Creatinine, Ser: 3.37 mg/dL — ABNORMAL HIGH (ref 0.61–1.24)
GFR calc Af Amer: 22 mL/min — ABNORMAL LOW (ref >60)
GFR calc non Af Amer: 19 mL/min — ABNORMAL LOW (ref >60)
Glucose, Bld: 103 mg/dL — ABNORMAL HIGH (ref 70–99)
Potassium: 4.8 mmol/L (ref 3.5–5.1)
Sodium: 133 mmol/L — ABNORMAL LOW (ref 135–145)

## 2019-09-17 LAB — MAGNESIUM: Magnesium: 2 mg/dL (ref 1.7–2.4)

## 2019-09-17 LAB — GLUCOSE, CAPILLARY
Glucose-Capillary: 136 mg/dL — ABNORMAL HIGH (ref 70–99)
Glucose-Capillary: 131 mg/dL — ABNORMAL HIGH (ref 70–99)
Glucose-Capillary: 117 mg/dL — ABNORMAL HIGH (ref 70–99)
Glucose-Capillary: 156 mg/dL — ABNORMAL HIGH (ref 70–99)

## 2019-09-17 LAB — CBC
HCT: 29 % — ABNORMAL LOW (ref 39.0–52.0)
Hemoglobin: 8.8 g/dL — ABNORMAL LOW (ref 13.0–17.0)
MCH: 27.3 pg (ref 26.0–34.0)
MCHC: 30.3 g/dL (ref 30.0–36.0)
MCV: 90.1 fL (ref 80.0–100.0)
Platelets: 420 10*3/uL — ABNORMAL HIGH (ref 150–400)
RBC: 3.22 MIL/uL — ABNORMAL LOW (ref 4.22–5.81)
RDW: 15.8 % — ABNORMAL HIGH (ref 11.5–15.5)
WBC: 14.8 10*3/uL — ABNORMAL HIGH (ref 4.0–10.5)
nRBC: 0 % (ref 0.0–0.2)

## 2019-09-17 MED ORDER — LABETALOL HCL 5 MG/ML IV SOLN: 10.0000 mg | INTRAVENOUS | Status: DC | PRN

## 2019-09-17 MED ORDER — HYDRALAZINE HCL 50 MG PO TABS
50.0000 mg | ORAL_TABLET | Freq: Three times a day (TID) | ORAL | Status: DC
  Administered 2019-09-17 – 2019-09-28 (×32): 50 mg via ORAL
  Filled 2019-09-17 (×33): qty 1

## 2019-09-17 MED ORDER — HYDRALAZINE HCL 25 MG PO TABS: 25.0000 mg | ORAL_TABLET | Freq: Three times a day (TID) | ORAL | Status: DC

## 2019-09-17 MED ORDER — AMLODIPINE BESYLATE 10 MG PO TABS
10.0000 mg | ORAL_TABLET | Freq: Every day | ORAL | Status: DC
  Administered 2019-09-18 – 2019-09-28 (×11): 10 mg via ORAL
  Filled 2019-09-17 (×11): qty 1

## 2019-09-17 MED ORDER — AMLODIPINE BESYLATE 5 MG PO TABS
5.0000 mg | ORAL_TABLET | Freq: Two times a day (BID) | ORAL | Status: DC
  Administered 2019-09-17 (×2): 5 mg via ORAL
  Filled 2019-09-17 (×2): qty 1

## 2019-09-17 NOTE — Progress Notes (Addendum)
PROGRESS NOTE    Jeffery [name]   WUX:[id number]  DOB: 10-20-1960  PCP: [name], MD    DOA: 09/07/2019 LOS: 10   Brief Narrative   59 y.o.malewith medical history significant ofObesity, DM2, HTN, P Afib, EtOH use, Charcot's foot, Chronic Pain syndrome, Hx of Cardioversion, diastolic CHF, chronic LE blister and edema s/p excision by podiatry comes with worsening of left lower ext foot pain.  Admitted for sepsis secondary to purulent left lower extremity cellulitis.  Hospital course complicated by AKI.  MRI left leg suggested extensive cellulitis with moderate left knee effusion.  Orthopedic consulted who aspirated left knee fluid suggestive of inflammatory fluid.  Getting Zyvox for his cellulitis.  Nephrology following.  Getting diuretics.   Assessment & Plan   Principal Problem:   Cellulitis of left lower extremity Active Problems:   Charcot foot due to diabetes mellitus ([place])   Essential hypertension   Uncontrolled type 2 diabetes mellitus with diabetic polyneuropathy, without long-term current use of insulin (H[facility])   Paroxysmal atrial fibrillation ([facility])   Chronic anticoagulation   AKI (acute kidney injury) ([place])   Left leg cellulitis   Sepsis ([place])   Acute pain of left knee   Sepsis secondary to left Lower extremity Cellulitis with some purulent; POA B/L lower extremity chronic skin changes with blister Sepsis physiology improved. MRI-left lower extremity cellulitis but no evidence of abscess/osteomyelitis Ultrasound ABI-difficult to obtain due to blisters Blood cultures negative. -Initially on IV vancomycin, now on Zyvox (since 6/15).  --Clinically monitor to determine duration.  Tomorrow is day 7 on Zyvox. -Pain control, bowel regimen -Daily dressing changes-Xeroform, Kerlix, Ace.   Volume Overload - secondary to worsening renal function vs CHF vs R heart failure / pulmonary hypertension.  Undergoing IV diuresis per nephrology with NET -10.6 L fluid  balance to date this admission.   --Echo pending  --Placed on 1500 cc fluid restriction --Nephrology following, appreciated. --Will request cardiology to see as well, appreciate their input.  Acute Kidney Injury on CKD Stage IIIa, worsening -Baseline creatinine 1.6, admission creatinine 2.4 increased to 3.6 and minimally improving so far. Nephrology following. -Lasix 60 mg IV every 8 hours. -Periodic bladder scans to ensure he is not having any urinary retention. -Renal ultrasound-negative  Left shoulder and left ankle pain, improved Acute mild gout flare Moderate left knee effusion-inflammatory versus infectious X-ray showed chronic changes.   MRI showed soft tissue inflammation concerning for cellulitis with moderate left knee effusion. -Seen by Ortho, s/p aspiration 6/14 suggestive of inflammatory fluid.   Received colchicine doing better. -Pain control -Continue work with PT/OT-SNF.  TOC team working on this  Constipation due to pain medications and lack of mobility.  Scheduled Miralax and Senna, PRN Dulcolax.  Suppository and/or enemas if no improvement.  Sinus bradycardia Continue amiodarone.  Discontinue metoprolol for now.  Essential Hypertension Continue Norvasc 10 mg PO daily. Increase PO hydralazine from 25 to 50 mg PO TID. PRN hydralazine if necessary.  Also IV diuresis as above.  DM2 with neuropathy, controlled -Insulin sliding scale and Accu-Chek. Lantus 50 units twice daily -Gabapentin -Diabetic diet  ParoxysmalA Fib, s/p Cardioversion -On  amiodarone and Eliquis  Morbid Obesity: Body mass index is 45.2 kg/m.  This complicates overall care and prognosis.  PT-recommending SNF  DVT prophylaxis:  apixaban (ELIQUIS) tablet 5 mg   Diet:  Diet Orders (From admission, onward)    Start     Ordered   09/11/19 0903  Diet Carb Modified Fluid consistency: Thin; Room  service appropriate? Yes  Diet effective now       Question Answer Comment  Diet-HS  Snack? Nothing   Calorie Level Medium 1600-2000   Fluid consistency: Thin   Room service appropriate? Yes      09/11/19 0903            Code Status: Full Code    Subjective 09/17/19    Patient asleep sitting up, woke easily.  He reports left knee pain today, very limited tolerance for weightbearing when up to sink or restroom.  No redness or warmth, no fevers or chills.  He otherwise denies other complaints at this time.  Disposition Plan & Communication   Status is: Inpatient  Remains inpatient appropriate because:IV treatments appropriate due to intensity of illness or inability to take PO and Inpatient level of care appropriate due to severity of illness.  Continues to have significant AKI and undergoing IV diuresis.   Dispo: The patient is from: Home              Anticipated d/c is to: SNF              Anticipated d/c date is: 3 days              Patient currently is not medically stable to d/c.    Family Communication: none at bedside will attempt to call    Consults, Procedures, Significant Events   Consultants:   nephrology   Antimicrobials:   Zyvox   Objective   Vitals:   09/17/19 0200 09/17/19 0446 09/17/19 0500 09/17/19 0744  BP: (!) 158/72 (!) 171/80  (!) 161/77  Pulse: 72 84  76  Resp:  16  18  Temp:  98 F (36.7 C)  98.2 F (36.8 C)  TempSrc:      SpO2:  93%  98%  Weight:   (!) 159.8 kg   Height:        Intake/Output Summary (Last 24 hours) at 09/17/2019 0817 Last data filed at 09/17/2019 3557 Gross per 24 hour  Intake 1110 ml  Output 2800 ml  Net -1690 ml   Filed Weights   09/15/19 0458 09/16/19 0500 09/17/19 0500  Weight: (!) 159.4 kg (!) 161.6 kg (!) 159.8 kg    Physical Exam:  General exam: awake, alert, no acute distress, morbidly obese Respiratory system: CTAB but diminished due to body habitus, no wheezes, rales or rhonchi, normal respiratory effort. Cardiovascular system: normal S1/S2,  RRR, edema of b/l LE's slowly  improving, still 1-2+ pitting.   Central nervous system: A&O x4. no gross focal neurologic deficits, normal speech Extremities: b/l legs in wraps, pitting edema to proximal shins bilaterally, no warmth or tenderness proximal to dressings   Labs   Data Reviewed: I have personally reviewed following labs and imaging studies  CBC: Recent Labs  Lab 09/13/19 0339 09/14/19 0356 09/15/19 0403 09/16/19 0412 09/17/19 0557  WBC 14.5* 12.1* 12.4* 11.8* 14.8*  HGB 9.4* 8.4* 8.9* 8.9* 8.8*  HCT 31.9* 28.0* 29.8* 29.7* 29.0*  MCV 91.1 90.0 89.2 90.0 90.1  PLT 484* 438* 468* 452* 322*   Basic Metabolic Panel: Recent Labs  Lab 09/11/19 0558 09/12/19 0358 09/13/19 0339 09/14/19 0356 09/15/19 0403 09/16/19 0412 09/17/19 0557  NA 136  --  137 137 137 134* 133*  K 4.6  --  4.6 4.8 4.7 4.6 4.8  CL 104  --  104 103 104 100 102  CO2 20*  --  21* 24 23 27  25  GLUCOSE 113*  --  123* 112* 140* 133* 103*  BUN 72*  --  66* 67* 61* 55* 51*  CREATININE 3.67*   < > 3.36* 3.80* 3.60* 3.52* 3.37*  CALCIUM 8.1*  --  8.2* 8.3* 8.5* 8.4* 8.4*  MG 2.7*   < > 2.6* 2.3 2.2 2.2 2.0  PHOS 6.9*  --   --   --   --   --   --    < > = values in this interval not displayed.   GFR: Estimated Creatinine Clearance: 38.7 mL/min (A) (by C-G formula based on SCr of 3.37 mg/dL (H)). Liver Function Tests: Recent Labs  Lab 09/11/19 0558  ALBUMIN 1.9*   No results for input(s): LIPASE, AMYLASE in the last 168 hours. No results for input(s): AMMONIA in the last 168 hours. Coagulation Profile: No results for input(s): INR, PROTIME in the last 168 hours. Cardiac Enzymes: No results for input(s): CKTOTAL, CKMB, CKMBINDEX, TROPONINI in the last 168 hours. BNP (last 3 results) No results for input(s): PROBNP in the last 8760 hours. HbA1C: No results for input(s): HGBA1C in the last 72 hours. CBG: Recent Labs  Lab 09/15/19 2048 09/16/19 0730 09/16/19 1213 09/16/19 1720 09/16/19 2039  GLUCAP 134* 132* 138*  148* 148*   Lipid Profile: No results for input(s): CHOL, HDL, LDLCALC, TRIG, CHOLHDL, LDLDIRECT in the last 72 hours. Thyroid Function Tests: No results for input(s): TSH, T4TOTAL, FREET4, T3FREE, THYROIDAB in the last 72 hours. Anemia Panel: No results for input(s): VITAMINB12, FOLATE, FERRITIN, TIBC, IRON, RETICCTPCT in the last 72 hours. Sepsis Labs: No results for input(s): PROCALCITON, LATICACIDVEN in the last 168 hours.  Recent Results (from the past 240 hour(s))  Culture, blood (Routine X 2) w Reflex to ID Panel     Status: None   Collection Time: 09/07/19 12:25 PM   Specimen: BLOOD  Result Value Ref Range Status   Specimen Description   Final    BLOOD RIGHT ANTECUBITAL Performed at [facility] [address], [place], [place] [address]    Special Requests   Final    BOTTLES DRAWN AEROBIC AND ANAEROBIC Blood Culture adequate volume Performed at [facility] [address] [place], [place] [address]    Culture   Final    NO GROWTH 5 DAYS Performed at [facility], [place] [address], [place], [place] [address]    Report Status 09/12/2019 FINAL  Final  SARS Coronavirus 2 by RT PCR (hospital order, performed in [facility] hospital lab) Nasopharyngeal Nasopharyngeal Swab     Status: None   Collection Time: 09/07/19  2:37 PM   Specimen: Nasopharyngeal Swab  Result Value Ref Range Status   SARS Coronavirus 2 NEGATIVE NEGATIVE Final    Comment: (NOTE) SARS-CoV-2 target nucleic acids are NOT DETECTED.  The SARS-CoV-2 RNA is generally detectable in upper and lower respiratory specimens during the acute phase of infection. The lowest concentration of SARS-CoV-2 viral copies this assay can detect is 250 copies / mL. A negative result does not preclude SARS-CoV-2 infection and should not be used as the sole basis for treatment or other patient management decisions.  A negative result may occur with improper specimen collection /  handling, submission of specimen other than nasopharyngeal swab, presence of viral mutation(s) within the areas targeted by this assay, and inadequate number of viral copies (<250 copies / mL). A negative result must be combined with clinical observations, patient history, and epidemiological information.  Fact  Sheet for Patients:   StrictlyIdeas.no  Fact Sheet for Healthcare Providers: BankingDealers.co.za  This test is not yet approved or  cleared by the [place] FDA and has been authorized for detection and/or diagnosis of SARS-CoV-2 by FDA under an Emergency Use Authorization (EUA).  This EUA will remain in effect (meaning this test can be used) for the duration of the COVID-19 declaration under Section 564(b)(1) of the Act, 21 U.S.C. section 360bbb-3(b)(1), unless the authorization is terminated or revoked sooner.  Performed at [facility], [place] [address], [place], [place] [address]   Culture, blood (Routine X 2) w Reflex to ID Panel     Status: None   Collection Time: 09/07/19  3:09 PM   Specimen: BLOOD RIGHT HAND  Result Value Ref Range Status   Specimen Description   Final    BLOOD RIGHT HAND Performed at [facility] [address] [name], [place] [address]    Special Requests   Final    BOTTLES DRAWN AEROBIC ONLY Blood Culture results may not be optimal due to an inadequate volume of blood received in culture bottles Performed at [facility] [address], [place], [place] [address]    Culture   Final    NO GROWTH 5 DAYS Performed at [facility], [place] [address], [place], [place] [address]    Report Status 09/12/2019 FINAL  Final  Body fluid culture     Status: None   Collection Time: 09/11/19  1:47 PM   Specimen: Synovium; Body Fluid  Result Value Ref Range Status   Specimen Description   Final    SYNOVIAL KNEE LEFT Performed at [facility] [address] [place], [place] [address]    Special Requests NONE  Final   Gram Stain   Final    ABUNDANT WBC PRESENT, PREDOMINANTLY PMN NO ORGANISMS SEEN    Culture   Final    NO GROWTH Performed at [facility], [place] [address] [place], [place] [address]    Report Status 09/14/2019 FINAL  Final  SARS CORONAVIRUS 2 (TAT 6-24 HRS) Nasopharyngeal Nasopharyngeal Swab     Status: None   Collection Time: 09/14/19  5:52 PM   Specimen: Nasopharyngeal Swab  Result Value Ref Range Status   SARS Coronavirus 2 NEGATIVE NEGATIVE Final    Comment: (NOTE) SARS-CoV-2 target nucleic acids are NOT DETECTED.  The SARS-CoV-2 RNA is generally detectable in upper and lower respiratory specimens during the acute phase of infection. Negative results do not preclude SARS-CoV-2 infection, do not rule out co-infections with other pathogens, and should not be used as the sole basis for treatment or other patient management decisions. Negative results must be combined with clinical observations, patient history, and epidemiological information. The expected result is Negative.  Fact Sheet for Patients: SugarRoll.be  Fact Sheet for Healthcare Providers: https://www.woods-mathews.com/  This test is not yet approved or cleared by the [place] FDA and  has been authorized for detection and/or diagnosis of SARS-CoV-2 by FDA under an Emergency Use Authorization (EUA). This EUA will remain  in effect (meaning this test can be used) for the duration of the COVID-19 declaration under Se ction 564(b)(1) of the Act, 21 U.S.C. section 360bbb-3(b)(1), unless the authorization is terminated or revoked sooner.  Performed at [facility], [place], [place] [address], [place],  [address]       Imaging Studies   DG Chest 2 View  Result Date:  09/15/2019 CLINICAL DATA:  Volume overload EXAM: CHEST - 2 VIEW COMPARISON:  Multiple priors,  most recent 09/07/2019 FINDINGS: Prominent, cephalized vascularity compatible with vascular congestion. Lung volumes are low with streaky opacities in the bases favoring atelectasis. Few septal lines are present with some central vascular cuffing. Stable cardiomediastinal contours from prior without frank cardiomegaly on this portable exam. No pneumothorax or effusion. No acute osseous or soft tissue abnormality. Degenerative changes are present in the imaged spine and shoulders. Prior right shoulder rotator cuff repair. Telemetry leads overlie the chest. IMPRESSION: 1. Vascular congestion. Subtle features of at most mild interstitial edema. No effusions. 2. Low lung volumes with streaky opacities in the bases favoring atelectasis. Electronically Signed   By: Lovena Le M.D.   On: 09/15/2019 16:21     Medications   Scheduled Meds: . amiodarone  200 mg Oral BID  . amLODipine  5 mg Oral BID  . apixaban  5 mg Oral BID  . atorvastatin  20 mg Oral Daily  . furosemide  60 mg Intravenous Q8H  . gabapentin  300 mg Oral BID  . insulin aspart  0-15 Units Subcutaneous TID WC  . insulin aspart  0-5 Units Subcutaneous QHS  . insulin glargine  40 Units Subcutaneous BID  . linezolid  600 mg Oral Q12H  . polyethylene glycol  17 g Oral Daily  . senna-docusate  1 tablet Oral BID  . sodium bicarbonate  650 mg Oral TID   Continuous Infusions:     LOS: 10 days    Time spent: 25 minutes with > 50% spent in coordination of care and direct patient contact.    Ezekiel Slocumb, DO [facility]  09/17/2019, 8:17 AM    If 7PM-7AM, please contact night-coverage. How to contact the [facility] Attending or Consulting provider [place] or covering provider during after hours [place], for this patient?    1. Check the care team in [facility] and look for a) attending/consulting [facility] provider listed and b) the [facility] team listed 2. Log into [email or web address] and use [place] universal password to access. If you do not  have the password, please contact the hospital operator. 3. Locate the [facility] provider you are looking for under [facility] and page to a number that you can be directly reached. 4. If you still have difficulty reaching the provider, please page the [facility] (Director on Call) for the Hospitalists listed on amion for assistance.

## 2019-09-17 NOTE — Progress Notes (Signed)
Oncall provider made aware of elevated BP. Orders received. Medication administered.    09/17/19 0446  Vitals  Temp 98 F (36.7 C)  BP (!) 171/80  MAP (mmHg) 106  BP Location Left Arm  BP Method Automatic  Patient Position (if appropriate) Lying  Pulse Rate 84  Pulse Rate Source Monitor  Resp 16  Oxygen Therapy  SpO2 93 %  O2 Device Room Air  MEWS Score  MEWS Temp 0  MEWS Systolic 0  MEWS Pulse 0  MEWS RR 0  MEWS LOC 0  MEWS Score 0  MEWS Score Color Green

## 2019-09-17 NOTE — Progress Notes (Signed)
[facility] Progress Note  Subjective: 2.4 L out yest, 2.2 L out so far today, creat down 3.3. would be getting up more but for pain in L knee   Vitals:   09/17/19 0200 09/17/19 0446 09/17/19 0500 09/17/19 0744  BP: (!) 158/72 (!) 171/80  (!) 161/77  Pulse: 72 84  76  Resp:  16  18  Temp:  98 F (36.7 C)  98.2 F (36.8 C)  TempSrc:    Oral  SpO2:  93%  98%  Weight:   (!) 159.8 kg   Height:        Exam: Gen alert, no distress No jvd or bruits Chest clear bilat to bases  RRR no MRG Abd soft ntnd no mass or ascites +bs GU normal male Ext diffuse 2 LE and smaller UE edema Neuro is alert, Ox 3 , nf    Date              Creat               eGFR  2012- 18         0.69- 0.90            2019- 2020     0.96- 1.13 May 2019         0.98  May 2021       1.10-  2.31       40- >60 ml/min  September 04, 2019  1.63                 46  June 10- 17                            28 > 17 ml/min               Home meds:  - amiodarone 200 bid/ eliquis 5 bid/ lipitor 20/ lasix 40-80 qd/ metoprolol 50 bid  - liraglutide 1.8 sq qd/ insulin lantus 51u bid/ insulin lispro 8u tid ac  - percocet prn qid/ gabapentin 600 hs prn  - prn's/ vitamins/ supplements    UA Oct 2018 - negative   UA 08/08/19 - cloudy, 100 prot, few bact, 0-5 rbc, 11-20 wbc   UA 09/10/19 - hazy, large Hb, 30 prot, >50 rbc, 11-20 wbc    Renal US 6/12 - 11.3/ 12.0 cm kidneys, limited exam d/t body habitus, no hydro, relative preservation of cortical thickness   ECHO 02/2019 - LVEF 55-60%, +LVH, mild reduced RV fxn, no valve issues    I/O since admit are 11.2L in and 15.8 L out  = - 4.5 L net    Wt's are down 168kg > 163kd today = - 5kg   BP's have been high since admission   UP/C ratio = 0.8   Assessment/ Plan: 1. AoCKD 3 - baseline creat 1.6, eGFR 46 ml/min. Baseline CKD likely due to DM/ HTN.  Here creat 2.5 >> 3.8, in setting of marked vol overload, hx of RV dysfunction by echo, normal LVEF. UP/C ratio 0.8.  Possible cardiorenal element.  UA +wbc/ rbc, will repeat. Still has a significant edema but much better than admission. Down 9-10kg. Needs to restrict fluid intake here. CXR negative yest. Cont lasix IV tid.  2. Vol overload - as above 3. HTN - BP's have been high since arrival high, ^'d norvasc 5 bid, will add hydralazine 25 tid. Avoid acei /ARB for now. Cont diuresing  4. DM2 on insulin 5. Atrial fibrillation - on amio, eliquis 6. LLE cellulitis - sp vanc, on linezolid  7. Morbid obesity 8. R HF - by last echo 02/2019    Kelly Splinter 09/17/2019, 10:55 AM   Recent Labs  Lab 09/11/19 0558 09/12/19 0358 09/16/19 0412 09/17/19 0557  K 4.6   < > 4.6 4.8  BUN 72*   < > 55* 51*  CREATININE 3.67*   < > 3.52* 3.37*  CALCIUM 8.1*   < > 8.4* 8.4*  PHOS 6.9*  --   --   --   HGB 8.7*   < > 8.9* 8.8*   < > = values in this interval not displayed.   Inpatient medications: . amiodarone  200 mg Oral BID  . amLODipine  5 mg Oral BID  . apixaban  5 mg Oral BID  . atorvastatin  20 mg Oral Daily  . furosemide  60 mg Intravenous Q8H  . gabapentin  300 mg Oral BID  . insulin aspart  0-15 Units Subcutaneous TID WC  . insulin aspart  0-5 Units Subcutaneous QHS  . insulin glargine  40 Units Subcutaneous BID  . linezolid  600 mg Oral Q12H  . polyethylene glycol  17 g Oral Daily  . senna-docusate  1 tablet Oral BID  . sodium bicarbonate  650 mg Oral TID    acetaminophen **OR** acetaminophen, bisacodyl, labetalol, morphine injection, ondansetron **OR** ondansetron (ZOFRAN) IV, oxyCODONE-acetaminophen **AND** oxyCODONE

## 2019-09-18 ENCOUNTER — Ambulatory Visit: Payer: Medicaid Other | Admitting: Family Medicine

## 2019-09-18 ENCOUNTER — Inpatient Hospital Stay (HOSPITAL_COMMUNITY): Payer: Medicaid Other

## 2019-09-18 ENCOUNTER — Encounter (HOSPITAL_COMMUNITY): Payer: Self-pay | Admitting: Internal Medicine

## 2019-09-18 DIAGNOSIS — L03116 Cellulitis of left lower limb: Secondary | ICD-10-CM

## 2019-09-18 DIAGNOSIS — Z7901 Long term (current) use of anticoagulants: Secondary | ICD-10-CM

## 2019-09-18 DIAGNOSIS — I480 Paroxysmal atrial fibrillation: Secondary | ICD-10-CM

## 2019-09-18 DIAGNOSIS — E8770 Fluid overload, unspecified: Secondary | ICD-10-CM

## 2019-09-18 DIAGNOSIS — L02619 Cutaneous abscess of unspecified foot: Secondary | ICD-10-CM

## 2019-09-18 DIAGNOSIS — I10 Essential (primary) hypertension: Secondary | ICD-10-CM

## 2019-09-18 DIAGNOSIS — I878 Other specified disorders of veins: Secondary | ICD-10-CM

## 2019-09-18 DIAGNOSIS — N179 Acute kidney failure, unspecified: Secondary | ICD-10-CM

## 2019-09-18 LAB — BASIC METABOLIC PANEL
Anion gap: 13 (ref 5–15)
BUN: 56 mg/dL — ABNORMAL HIGH (ref 6–20)
CO2: 23 mmol/L (ref 22–32)
Calcium: 8.6 mg/dL — ABNORMAL LOW (ref 8.9–10.3)
Chloride: 98 mmol/L (ref 98–111)
Creatinine, Ser: 3.52 mg/dL — ABNORMAL HIGH (ref 0.61–1.24)
GFR calc Af Amer: 21 mL/min — ABNORMAL LOW (ref >60)
GFR calc non Af Amer: 18 mL/min — ABNORMAL LOW (ref >60)
Glucose, Bld: 101 mg/dL — ABNORMAL HIGH (ref 70–99)
Potassium: 5.2 mmol/L — ABNORMAL HIGH (ref 3.5–5.1)
Sodium: 134 mmol/L — ABNORMAL LOW (ref 135–145)

## 2019-09-18 LAB — URINALYSIS, ROUTINE W REFLEX MICROSCOPIC
Bilirubin Urine: NEGATIVE
Glucose, UA: NEGATIVE mg/dL
Ketones, ur: NEGATIVE mg/dL
Leukocytes,Ua: NEGATIVE
Nitrite: NEGATIVE
Protein, ur: 30 mg/dL — AB
RBC / HPF: >50 RBC/hpf — ABNORMAL HIGH (ref 0–5)
Specific Gravity, Urine: 1.006 (ref 1.005–1.030)
pH: 7 (ref 5.0–8.0)

## 2019-09-18 LAB — CBC
HCT: 29.2 % — ABNORMAL LOW (ref 39.0–52.0)
Hemoglobin: 8.7 g/dL — ABNORMAL LOW (ref 13.0–17.0)
MCH: 26.9 pg (ref 26.0–34.0)
MCHC: 29.8 g/dL — ABNORMAL LOW (ref 30.0–36.0)
MCV: 90.1 fL (ref 80.0–100.0)
Platelets: 350 10*3/uL (ref 150–400)
RBC: 3.24 MIL/uL — ABNORMAL LOW (ref 4.22–5.81)
RDW: 15.8 % — ABNORMAL HIGH (ref 11.5–15.5)
WBC: 13.2 10*3/uL — ABNORMAL HIGH (ref 4.0–10.5)
nRBC: 0 % (ref 0.0–0.2)

## 2019-09-18 LAB — GLUCOSE, CAPILLARY
Glucose-Capillary: 72 mg/dL (ref 70–99)
Glucose-Capillary: 130 mg/dL — ABNORMAL HIGH (ref 70–99)
Glucose-Capillary: 100 mg/dL — ABNORMAL HIGH (ref 70–99)

## 2019-09-18 LAB — ECHOCARDIOGRAM COMPLETE
Height: 74.016 in
Weight: 5678.4 oz

## 2019-09-18 NOTE — Progress Notes (Signed)
PROGRESS NOTE    Jeffery [name]   UTM:[id number]  DOB: September 17, 1960  PCP: [name], MD    DOA: 09/07/2019 LOS: 11   Brief Narrative   59 y.o.malewith medical history significant ofObesity, DM2, HTN, P Afib, EtOH use, Charcot's foot, Chronic Pain syndrome, Hx of Cardioversion, diastolic CHF, chronic LE blister and edema s/p excision by podiatry comes with worsening of left lower ext foot pain.  Admitted for sepsis secondary to purulent left lower extremity cellulitis.  Hospital course complicated by AKI.  MRI left leg suggested extensive cellulitis with moderate left knee effusion.  Orthopedic consulted who aspirated left knee fluid suggestive of inflammatory fluid.  Getting Zyvox for his cellulitis.  Nephrology following.  Getting diuretics.   Assessment & Plan    Sepsis secondary to left Lower extremity Cellulitis with some purulent; POA B/L lower extremity chronic skin changes with blister Sepsis physiology improved. MRI-left lower extremity cellulitis but no evidence of abscess/osteomyelitis Ultrasound ABI-difficult to obtain due to blisters Blood cultures negative. -Initially on IV vancomycin, now on Zyvox (since 6/15).  --Plan for at least 10 to 14-day course. -Pain control, bowel regimen -Daily dressing changes-Xeroform, Kerlix, Ace.   Volume Overload - secondary to worsening renal function vs CHF vs R heart failure / pulmonary hypertension.  Remains on IV diuretics. --Echo pending  --Placed on 1500 cc fluid restriction --Nephrology following, appreciated. --Cardiology also consulted.  Acute Kidney Injury on CKD Stage IIIa/mild hyperkalemia -Baseline creatinine 1.6, admission creatinine 2.4 increased to 3.6 and minimally improving so far. Nephrology following. -Remains on furosemide -Periodic bladder scans to ensure he is not having any urinary retention. -Renal ultrasound-negative Concern for cardiorenal syndrome.  Echocardiogram is pending.  Defer  electrolytes to nephrology.  Left shoulder and left ankle pain, improved Acute mild gout flare Moderate left knee effusion-inflammatory versus infectious X-ray showed chronic changes.   MRI showed soft tissue inflammation concerning for cellulitis with moderate left knee effusion. -Seen by Ortho, s/p aspiration 6/14 suggestive of inflammatory fluid.   Received colchicine doing better. -Pain control -Continue work with PT/OT-SNF.  TOC team working on this  Constipation due to pain medications and lack of mobility.  Scheduled Miralax and Senna, PRN Dulcolax.  Suppository and/or enemas if no improvement.  Sinus bradycardia Continue amiodarone.    Metoprolol was discontinued.  Essential Hypertension Continue Norvasc 10 mg PO daily. Increase PO hydralazine from 25 to 50 mg PO TID. PRN hydralazine if necessary.  Also IV diuresis as above.  DM2 with neuropathy, controlled -Insulin sliding scale and Accu-Chek. Lantus 50 units twice daily -Gabapentin -Diabetic diet A1c 8.0.  ParoxysmalA Fib, s/p Cardioversion -On  amiodarone and Eliquis.  LFTs and TSH to be checked.  Anemia likely due to chronic kidney disease Hemoglobin is stable.  No evidence of overt bleeding.  Morbid Obesity: Body mass index is 45.55 kg/m.  This complicates overall care and prognosis.  PT-recommending SNF  DVT prophylaxis:  apixaban (ELIQUIS) tablet 5 mg   Diet:  Diet Orders (From admission, onward)    Start     Ordered   09/17/19 1559  Diet Carb Modified Fluid consistency: Thin; Room service appropriate? Yes; Fluid restriction: 1500 mL Fluid  Diet effective now       Question Answer Comment  Diet-HS Snack? Nothing   Calorie Level Medium 1600-2000   Fluid consistency: Thin   Room service appropriate? Yes   Fluid restriction: 1500 mL Fluid      09/17/19 1558  Code Status: Full Code    Subjective 09/18/19    Patient mentions that he did not get his pain medications as  scheduled.  Nurse mentions that he indeed did get his Percocet every 4 hours.  Patient otherwise feels well.  Denies any chest pain.  No shortness of breath while sitting.  Disposition Plan & Communication   Status is: Inpatient  Remains inpatient appropriate because:IV treatments appropriate due to intensity of illness or inability to take PO and Inpatient level of care appropriate due to severity of illness.  Continues to have significant AKI and undergoing IV diuresis.   Dispo: The patient is from: Home              Anticipated d/c is to: SNF              Anticipated d/c date is: 3 days              Patient currently is not medically stable to d/c.    Family Communication: Discussed in detail with patient.   Consults, Procedures, Significant Events   Consultants:   Nephrology  Cardiology   Antimicrobials:   Zyvox   Objective   Vitals:   09/17/19 0744 09/17/19 1415 09/17/19 2047 09/18/19 0532  BP: (!) 161/77 139/82 (!) 156/86 (!) 146/74  Pulse: 76 79 79 73  Resp: 18 20 18 20   Temp: 98.2 F (36.8 C) 98.6 F (37 C) 98.2 F (36.8 C) 97.9 F (36.6 C)  TempSrc: Oral Oral Oral Oral  SpO2: 98% 96% 95% 96%  Weight:    (!) 161 kg  Height:        Intake/Output Summary (Last 24 hours) at 09/18/2019 1248 Last data filed at 09/18/2019 1131 Gross per 24 hour  Intake 712 ml  Output 3525 ml  Net -2813 ml   Filed Weights   09/16/19 0500 09/17/19 0500 09/18/19 0532  Weight: (!) 161.6 kg (!) 159.8 kg (!) 161 kg    Physical Exam:  General appearance: Awake alert.  In no distress Resp: Normal effort at rest.  Diminished air entry at the bases.  No wheezing or rhonchi. Cardio: S1-S2 is normal regular.  No S3-S4.  No rubs murmurs or bruit GI: Abdomen is soft.  Nontender nondistended.  Bowel sounds are present normal.  No masses organomegaly Extremities: Legs covered and wraps.  No erythema noted. Neurologic: Alert and oriented x3.  No focal neurological deficits.      Labs   Data Reviewed: I have personally reviewed following labs and imaging studies  CBC: Recent Labs  Lab 09/14/19 0356 09/15/19 0403 09/16/19 0412 09/17/19 0557 09/18/19 0356  WBC 12.1* 12.4* 11.8* 14.8* 13.2*  HGB 8.4* 8.9* 8.9* 8.8* 8.7*  HCT 28.0* 29.8* 29.7* 29.0* 29.2*  MCV 90.0 89.2 90.0 90.1 90.1  PLT 438* 468* 452* 420* 712   Basic Metabolic Panel: Recent Labs  Lab 09/13/19 0339 09/13/19 0339 09/14/19 0356 09/15/19 0403 09/16/19 0412 09/17/19 0557 09/18/19 0356  NA 137   < > 137 137 134* 133* 134*  K 4.6   < > 4.8 4.7 4.6 4.8 5.2*  CL 104   < > 103 104 100 102 98  CO2 21*   < > 24 23 27 25 23   GLUCOSE 123*   < > 112* 140* 133* 103* 101*  BUN 66*   < > 67* 61* 55* 51* 56*  CREATININE 3.36*   < > 3.80* 3.60* 3.52* 3.37* 3.52*  CALCIUM 8.2*   < >  8.3* 8.5* 8.4* 8.4* 8.6*  MG 2.6*  --  2.3 2.2 2.2 2.0  --    < > = values in this interval not displayed.   GFR: Estimated Creatinine Clearance: 37.2 mL/min (A) (by C-G formula based on SCr of 3.52 mg/dL (H)). CBG: Recent Labs  Lab 09/17/19 1136 09/17/19 1633 09/17/19 2046 09/18/19 0759 09/18/19 1158  GLUCAP 131* 117* 156* 72 130*     Recent Results (from the past 240 hour(s))  Body fluid culture     Status: None   Collection Time: 09/11/19  1:47 PM   Specimen: Synovium; Body Fluid  Result Value Ref Range Status   Specimen Description   Final    SYNOVIAL KNEE LEFT Performed at [facility] [address] [place], [place] [address]    Special Requests NONE  Final   Gram Stain   Final    ABUNDANT WBC PRESENT, PREDOMINANTLY PMN NO ORGANISMS SEEN    Culture   Final    NO GROWTH Performed at [facility], [place] [address], [place], [place] [address]    Report Status 09/14/2019 FINAL  Final  SARS CORONAVIRUS 2 (TAT 6-24 HRS) Nasopharyngeal Nasopharyngeal Swab     Status: None   Collection Time: 09/14/19  5:52 PM   Specimen: Nasopharyngeal Swab  Result Value Ref Range  Status   SARS Coronavirus 2 NEGATIVE NEGATIVE Final    Comment: (NOTE) SARS-CoV-2 target nucleic acids are NOT DETECTED.  The SARS-CoV-2 RNA is generally detectable in upper and lower respiratory specimens during the acute phase of infection. Negative results do not preclude SARS-CoV-2 infection, do not rule out co-infections with other pathogens, and should not be used as the sole basis for treatment or other patient management decisions. Negative results must be combined with clinical observations, patient history, and epidemiological information. The expected result is Negative.  Fact Sheet for Patients: SugarRoll.be  Fact Sheet for Healthcare Providers: https://www.woods-mathews.com/  This test is not yet approved or cleared by the [place] FDA and  has been authorized for detection and/or diagnosis of SARS-CoV-2 by FDA under an Emergency Use Authorization (EUA). This EUA will remain  in effect (meaning this test can be used) for the duration of the COVID-19 declaration under Se ction 564(b)(1) of the Act, 21 U.S.C. section 360bbb-3(b)(1), unless the authorization is terminated or revoked sooner.  Performed at [facility], [place] [address], [place], [place] [address]       Imaging Studies   No results found.   Medications   Scheduled Meds: . amiodarone  200 mg Oral BID  . amLODipine  10 mg Oral Daily  . apixaban  5 mg Oral BID  . atorvastatin  20 mg Oral Daily  . furosemide  60 mg Intravenous Q8H  . gabapentin  300 mg Oral BID  . hydrALAZINE  50 mg Oral Q8H  . insulin aspart  0-15 Units Subcutaneous TID WC  . insulin aspart  0-5 Units Subcutaneous QHS  . insulin glargine  40 Units Subcutaneous BID  . linezolid  600 mg Oral Q12H  . polyethylene glycol  17 g Oral Daily  . senna-docusate  1 tablet Oral BID   Continuous Infusions:     LOS: 11 days      Bonnielee Haff, [facility]  09/18/2019,  12:48 PM    If 7PM-7AM, please contact night-coverage. How to contact the [facility] Attending or Consulting provider [place] or covering provider during after hours 7P -  7A, for this patient?    1. Check the care team in [facility] and look for a) attending/consulting [facility] provider listed and b) the [facility] team listed 2. Log into [email or web address] and use [facility]'s universal password to access. If you do not have the password, please contact the hospital operator. 3. Locate the [facility] provider you are looking for under [facility] and page to a number that you can be directly reached. 4. If you still have difficulty reaching the provider, please page the [facility] (Director on Call) for the Hospitalists listed on amion for assistance.

## 2019-09-18 NOTE — Progress Notes (Signed)
[facility] Progress Note  Subjective: 3.2 L out yest, 900 out so far today, creat  3.3--> 3.5 so fairly stable.  Says that the last couple of days has been having nausea around noon time-  Thinks is something with his meds - maybe zyvox  Vitals:   09/17/19 0744 09/17/19 1415 09/17/19 2047 09/18/19 0532  BP: (!) 161/77 139/82 (!) 156/86 (!) 146/74  Pulse: 76 79 79 73  Resp: 18 20 18 20   Temp: 98.2 F (36.8 C) 98.6 F (37 C) 98.2 F (36.8 C) 97.9 F (36.6 C)  TempSrc: Oral Oral Oral Oral  SpO2: 98% 96% 95% 96%  Weight:    (!) 161 kg  Height:        Exam: Gen alert, no distress No jvd or bruits Chest clear bilat to bases  RRR no MRG Abd soft ntnd no mass or ascites +bs GU normal male Ext diffuse 2 LE and smaller UE edema Neuro is alert, Ox 3 , nf    Date              Creat               eGFR  2012- 18         0.69- 0.90            2019- 2020     0.96- 1.13 May 2019         0.98  May 2021       1.10-  2.31       40- >60 ml/min  September 04, 2019  1.63                 46 Since then worsening - has been 3.3 to 3.8 since 6/12-  Not a clear trend              UA Oct 2018 - negative   UA 08/08/19 - cloudy, 100 prot, few bact, 0-5 rbc, 11-20 wbc   UA 09/10/19 - hazy, large Hb, 30 prot, >50 rbc, 11-20 wbc    Renal US 6/12 - 11.3/ 12.0 cm kidneys, limited exam d/t body habitus, no hydro, relative preservation of cortical thickness   ECHO 02/2019 - LVEF 55-60%, +LVH, mild reduced RV fxn, no valve issues    I/O since admit are 11.2L in and 15.8 L out  = - 4.5 L net    Wt's are down 168kg > 163kd today = - 5kg   BP's have been high since admission   UP/C ratio = 0.8   Assessment/ Plan: 1. AoCKD 3 - baseline creat 1.6, eGFR 46 ml/min. Baseline CKD likely due to DM/ HTN.  Here creat 2.5 >> 3.8, in setting of marked vol overload, hx of RV dysfunction by echo, normal LVEF. UP/C ratio 0.8. Possible cardiorenal element.  UA +wbc/ rbc, will repeat.  Recovery is stalled  for some reason-  Is it because BP is better controlled-  Possibly some relative hypotension for him ? No dialysis needs-  2. Vol overload - as above- Still has a significant edema but much better than admission. Down 9-10kg. Needs to restrict fluid intake here. CXR negative yest. Cont lasix IV tid 3. HTN - BP's have been high since arrival high, ^'d norvasc 5 bid, added hydralazine - now at 50 tid. Avoid acei /ARB for now. Cont diuresing - reasonable for now - no change  4. DM2 on insulin 5. Atrial fibrillation - on amio, eliquis 6. LLE  cellulitis - sp vanc, on linezolid  7. Morbid obesity 8. R HF - by last echo 02/2019    Louis Meckel  09/18/2019, 12:14 PM   Recent Labs  Lab 09/17/19 0557 09/18/19 0356  K 4.8 5.2*  BUN 51* 56*  CREATININE 3.37* 3.52*  CALCIUM 8.4* 8.6*  HGB 8.8* 8.7*   Inpatient medications: . amiodarone  200 mg Oral BID  . amLODipine  10 mg Oral Daily  . apixaban  5 mg Oral BID  . atorvastatin  20 mg Oral Daily  . furosemide  60 mg Intravenous Q8H  . gabapentin  300 mg Oral BID  . hydrALAZINE  50 mg Oral Q8H  . insulin aspart  0-15 Units Subcutaneous TID WC  . insulin aspart  0-5 Units Subcutaneous QHS  . insulin glargine  40 Units Subcutaneous BID  . linezolid  600 mg Oral Q12H  . polyethylene glycol  17 g Oral Daily  . senna-docusate  1 tablet Oral BID    acetaminophen **OR** acetaminophen, bisacodyl, labetalol, morphine injection, ondansetron **OR** ondansetron (ZOFRAN) IV, oxyCODONE-acetaminophen **AND** oxyCODONE

## 2019-09-18 NOTE — Progress Notes (Signed)
  Echocardiogram 2D Echocardiogram has been performed.  Jeffery [name] 09/18/2019, 3:29 PM

## 2019-09-18 NOTE — Consult Note (Signed)
Cardiology Consultation:   Patient ID: [name] MRN: [id number]; DOB: [birth year]  Admit date: 09/07/2019 Date of Consult: 09/18/2019  Primary Care Provider: [name], [facility] Cardiologist: Shelva Majestic, MD  [facility] Electrophysiologist:  Will Meredith Leeds, MD    Patient Profile:   Jeffery [name] is a 59 y.o. male with a hx of PAF, DM-2, HTN, obesity and ETOH abuse who is being seen today for the evaluation of volume overload at the request of Dr Maryland Pink. Marland Kitchen  History of Present Illness:   Mr. Brining with above hx and PAF with prior successful DCCVs (each a year or so apart)  last one 05/15/19 that was successful.  His EF has been 55-60%, G1DD  With last echo 02/2019, moderate LVH, mild LAE, mild MR and mild TRand he is on amiodarone.  Had Nuc study 10/2017 that had no ischemia or scar EF 48% but otherwise normal study.  He is on eliquis for his PAF with CHA2DS2VASc of 2  In May + hospitalization with severe sepsis with MSSA at foot ulcer (Charcot's foot).  Underwent debridement.  Also with AKI and meds adjusted  Last visit to office 09/05/19 with recent placement of lasix for lower ext edema.  Also hyperkalemia and Lokelma added.  Lasix increased for 3 days then to return to 40 mg daily.     Now admitted to [facility] on 09/07/19 with lt foot pain, lt lower side and Lt shoulder pain. He had called our office and could not get out of bed so we instructed to call 911 and come to ER.   Increased erythema in Lt Lower ext.  + acute cellulitis of LL ext. Elevated WBC and Cr up to 2.48 from baseline 1.6.    Since admit Nephrology has seen and Cr up to 3.6.  On lasix 60 mg IV every 8 hours.  Renal ultrasound negative.    Has been on diuresis neg 13.04 L fluid balance since admit improved after fluid restriction.  Placed on 1500 cc fluid restriction.   Wt down from 168.7 kg to 161 Kg    He has been SB and BB was stopped amiodarone continues. .   On 09/11/19 BNP 288  EKG:   The EKG was personally reviewed and demonstrates:  On admit he was in SR PR at 208 ms and incomplete LBBB.   Telemetry:  Telemetry was personally reviewed and demonstrates:  SR in 70-80s  Echo scheduled for today  Labs today Na 134, K+ 5.2 BUN 56, Cr 3.52 GFR 18  WBC 13.2 Hgb 8.7 Plts 350   No chest pain, no SOB so much but + lower ext edema to hips.    Past Medical History:  Diagnosis Date  . Atrial fibrillation ([place])   . Cellulitis and abscess of foot 07/2019   RIGHT FOOT  . Charcot's joint of right foot   . DDD (degenerative disc disease), lumbar   . Diabetes mellitus without complication ([place])   . Fatty liver   . Hypertension   . Obesity   . Rotator cuff disorder   . Shoulder impingement, right   . Spinal stenosis at L4-L5 level     Past Surgical History:  Procedure Laterality Date  . AMPUTATION Left 10/02/2014   Procedure: Left Third toe amputation ;  Surgeon: [name], MD;  Location: [place];  Service: Orthopedics;  Laterality: Left;  Regular bed, wants to follow hip  . ANTERIOR CRUCIATE LIGAMENT REPAIR Right 90  reconstruction  . APPLICATION OF WOUND VAC Left 10/02/2014   Procedure: APPLICATION OF WOUND VAC; toe Surgeon: [name], MD;  Location: [place];  Service: Orthopedics;  Laterality: Left;  . CARDIOVERSION N/A 04/26/2019   Procedure: CARDIOVERSION;  Surgeon: Pixie Casino, MD;  Location: [facility] ENDOSCOPY;  Service: Cardiovascular;  Laterality: N/A;  . CARDIOVERSION N/A 05/18/2019   Procedure: CARDIOVERSION (CATH LAB);  Surgeon: Constance Haw, MD;  Location: [place] CV LAB;  Service: Cardiovascular;  Laterality: N/A;  . I & D EXTREMITY Left 10/05/2014   Procedure: IRRIGATION AND DEBRIDEMENT LEFT FOOT;  Surgeon: [name], MD;  Location: [place];  Service: Orthopedics;  Laterality: Left;  . INCISION AND DRAINAGE Right 08/09/2019   Procedure: INCISION AND DRAINAGE RIGHT FOOT;  Surgeon: Trula Slade, DPM;  Location: [place];  Service: Podiatry;   Laterality: Right;  . KNEE ARTHROSCOPY W/ ACL RECONSTRUCTION Right   . TOTAL KNEE ARTHROPLASTY Right 03/28/2015  . TOTAL KNEE ARTHROPLASTY Right 03/28/2015   Procedure: RIGHT TOTAL KNEE ARTHROPLASTY;  Surgeon: [name], MD;  Location: [place];  Service: Orthopedics;  Laterality: Right;  . WOUND DEBRIDEMENT Right 08/14/2019   Procedure: DEBRIDEMENT WOUND;  Surgeon: Trula Slade, DPM;  Location: [place];  Service: Podiatry;  Laterality: Right;     Home Medications:  Prior to Admission medications   Medication Sig Start Date End Date Taking? Authorizing Provider  amiodarone (PACERONE) 200 MG tablet Take 200 mg by mouth 2 (two) times daily.    Yes [provider]  apixaban (ELIQUIS) 5 MG TABS tablet Take 1 tablet (5 mg total) by mouth 2 (two) times daily. 06/20/19  Yes [name], MD  atorvastatin (LIPITOR) 20 MG tablet Take 1 tablet (20 mg total) by mouth daily. 03/28/19  Yes [name], MD  doxycycline (VIBRA-TABS) 100 MG tablet Take 1 tablet (100 mg total) by mouth 2 (two) times daily. 09/05/19  Yes Trula Slade, DPM  furosemide (LASIX) 40 MG tablet Take 1 tablet (40 mg total) by mouth daily. Patient taking differently: Take 40-80 mg by mouth See admin instructions. Per MD pt started taking 80mg  09/06/19 x3days then he is to resume back to 40mg  QD 04/21/19 09/07/19 Yes Troy Sine, MD  gabapentin (NEURONTIN) 300 MG capsule Take 1 capsule (300 mg total) by mouth 2 (two) times daily. Patient taking differently: Take 600 mg by mouth at bedtime as needed (pain).  03/28/19  Yes Newlin, Charlane Ferretti, MD  insulin glargine (LANTUS SOLOSTAR) 100 UNIT/ML Solostar Pen Inject 50 Units into the skin 2 (two) times daily. Patient taking differently: Inject 51 Units into the skin 2 (two) times daily.  06/20/19  Yes Newlin, Charlane Ferretti, MD  insulin lispro (INSULIN LISPRO) 100 UNIT/ML KwikPen Junior Inject 0.08 mLs (8 Units total) into the skin 3 (three) times daily. Patient taking differently:  Inject 8 Units into the skin 3 (three) times daily as needed (Low blood sugar).  03/28/19  Yes [name], MD  metoprolol tartrate (LOPRESSOR) 100 MG tablet Take 0.5 tablets (50 mg total) by mouth 2 (two) times daily. 08/24/19 11/22/19 Yes Camnitz, Will Hassell Done, MD  oxyCODONE-acetaminophen (PERCOCET) 10-325 MG tablet Take 1 tablet by mouth every 4 (four) hours as needed for pain.    Yes [provider]  polyethylene glycol (MIRALAX / GLYCOLAX) 17 g packet Take 17 g by mouth daily as needed for mild constipation. 08/16/19  Yes Nita Sells, MD  Sennosides (EX-LAX) 15 MG TABS Take 15 mg  by mouth daily as needed (Constipation).   Yes [provider]  sodium chloride (OCEAN) 0.65 % SOLN nasal spray Place 1 spray into both nostrils daily as needed for congestion.    Yes [provider]  Accu-Chek FastClix Lancets MISC Use as directed to check blood sugar at least twice daily. E11.8 E11.65 Z79.4 Patient taking differently: 1 each by Other route See admin instructions. Use as directed to check blood sugar at least twice daily. E11.8 E11.65 Z79.4 10/18/18   [name], MD  Blood Glucose Monitoring Suppl (ACCU-CHEK AVIVA) device Use as instructed to check blood sugar 2 times daily. E11.8 E11.65 Z79.4 Patient taking differently: 1 each by Other route See admin instructions. Use as instructed to check blood sugar 2 times daily. E11.8 E11.65 Z79.4 10/18/18 10/18/19  [name], MD  glucose blood (ACCU-CHEK AVIVA) test strip Use as instructed to check blood sugar 2 times daily. E11.8 E11.65 Z79.4 Patient taking differently: 1 each by Other route See admin instructions. Use as instructed to check blood sugar 2 times daily. E11.8 E11.65 Z79.4 10/18/18   [name], MD  Insulin Pen Needle (B-D ULTRAFINE III SHORT PEN) 31G X 8 MM MISC 1 each by Does not apply route 3 (three) times daily. 10/12/17   [name], MD  Insulin Syringe-Needle U-100 (TRUEPLUS INSULIN SYRINGE)  30G X 5/16" 0.5 ML MISC Use as directed 3 times daily Patient taking differently: 1 each by Other route See admin instructions. Use as directed 3 times daily 10/06/17   [name], MD  liraglutide (VICTOZA) 18 MG/3ML SOPN Inject 0.3 mLs (1.8 mg total) into the skin daily. Patient not taking: Reported on 09/07/2019 06/20/19   [name], MD  TRUEPLUS INSULIN SYRINGE 30G X 5/16" 0.5 ML MISC USE AS DIRECTED 3 TIMES DAILY Patient taking differently: 1 each by Other route in the morning, at noon, and at bedtime.  04/03/16   Tresa Garter, MD    Inpatient Medications: Scheduled Meds: . amiodarone  200 mg Oral BID  . amLODipine  10 mg Oral Daily  . apixaban  5 mg Oral BID  . atorvastatin  20 mg Oral Daily  . furosemide  60 mg Intravenous Q8H  . gabapentin  300 mg Oral BID  . hydrALAZINE  50 mg Oral Q8H  . insulin aspart  0-15 Units Subcutaneous TID WC  . insulin aspart  0-5 Units Subcutaneous QHS  . insulin glargine  40 Units Subcutaneous BID  . linezolid  600 mg Oral Q12H  . polyethylene glycol  17 g Oral Daily  . senna-docusate  1 tablet Oral BID   Continuous Infusions:  PRN Meds: acetaminophen **OR** acetaminophen, bisacodyl, labetalol, morphine injection, ondansetron **OR** ondansetron (ZOFRAN) IV, oxyCODONE-acetaminophen **AND** oxyCODONE  Allergies:    Allergies  Allergen Reactions  . Metformin And Related Other (See Comments)    GI upset    Social History:   Social History   Socioeconomic History  . Marital status: Single    Spouse name: Not on file  . Number of children: Not on file  . Years of education: Not on file  . Highest education level: Not on file  Occupational History  . Occupation: Management consultant  Tobacco Use  . Smoking status: Former Smoker    Packs/day: 0.00    Years: 38.00    Pack years: 0.00    Types: Cigarettes  . Smokeless tobacco: Never Used  . Tobacco comment: QUIT 10/2016  Vaping Use  . Vaping Use:  Never used    Substance and Sexual Activity  . Alcohol use: No    Alcohol/week: 5.0 - 6.0 standard drinks    Types: 5 - 6 Cans of beer per week  . Drug use: Yes    Types: Marijuana    Comment: last week ; denies 03/24/17  . Sexual activity: Not on file  Other Topics Concern  . Not on file  Social History Narrative   Lives alone.   Social Determinants of Health   Financial Resource Strain:   . Difficulty of Paying Living Expenses:   Food Insecurity:   . Worried About Charity fundraiser in the Last Year:   . Arboriculturist in the Last Year:   Transportation Needs:   . Film/video editor (Medical):   Marland Kitchen Lack of Transportation (Non-Medical):   Physical Activity:   . Days of Exercise per Week:   . Minutes of Exercise per Session:   Stress:   . Feeling of Stress :   Social Connections:   . Frequency of Communication with Friends and Family:   . Frequency of Social Gatherings with Friends and Family:   . Attends Religious Services:   . Active Member of Clubs or Organizations:   . Attends Archivist Meetings:   Marland Kitchen Marital Status:   Intimate Partner Violence:   . Fear of Current or Ex-Partner:   . Emotionally Abused:   Marland Kitchen Physically Abused:   . Sexually Abused:     Family History:    Family History  Problem Relation Age of Onset  . Diabetes Father   . Hypertension Father   . Heart failure Father      ROS:  Please see the history of present illness.  General:no colds or fevers, no weight changes Skin:no rashes or ulcers HEENT:no blurred vision, no congestion CV:see HPI PUL:see HPI GI:no diarrhea constipation or melena, no indigestion GU:no hematuria, no dysuria MS:no joint pain, no claudication- legs were so swollen he could not walk Neuro:no syncope, no lightheadedness Endo:+ diabetes, no thyroid disease  All other ROS reviewed and negative.     Physical Exam/Data:   Vitals:   09/17/19 0744 09/17/19 1415 09/17/19 2047 09/18/19 0532  BP: (!) 161/77 139/82  (!) 156/86 (!) 146/74  Pulse: 76 79 79 73  Resp: 18 20 18 20   Temp: 98.2 F (36.8 C) 98.6 F (37 C) 98.2 F (36.8 C) 97.9 F (36.6 C)  TempSrc: Oral Oral Oral Oral  SpO2: 98% 96% 95% 96%  Weight:    (!) 161 kg  Height:        Intake/Output Summary (Last 24 hours) at 09/18/2019 0813 Last data filed at 09/18/2019 0300 Gross per 24 hour  Intake 1180 ml  Output 3225 ml  Net -2045 ml   Last 3 Weights 09/18/2019 09/17/2019 09/16/2019  Weight (lbs) 354 lb 14.4 oz 352 lb 3.2 oz 356 lb 3.2 oz  Weight (kg) 160.982 kg 159.757 kg 161.571 kg     Body mass index is 45.55 kg/m.  General:  Well nourished, well developed, in no acute distress HEENT: normal Lymph: no adenopathy Neck: no JVD flat in bed and no SOB  Endocrine:  No thryomegaly Vascular: No carotid bruits; FA pulses 2+ bilaterally without bruits  Cardiac:  normal S1, S2; RRR; no murmur gallup rub or click Lungs:  clear to auscultation bilaterally, no wheezing, rhonchi or rales  Abd: soft, nontender, no hepatomegaly  Ext: 2-3+ edema bil to waist.  Has  improved legs wrapped with una boots Musculoskeletal:  No deformities, BUE and BLE strength normal and equal Skin: warm and dry  Neuro:  Alert and oriented X 3 MAE follows commands, no focal abnormalities noted Psych:  Normal affect   Relevant CV Studies: Echo 02/2019 IMPRESSIONS    1. Left ventricular ejection fraction, by visual estimation, is 55 to  60%. The left ventricle has normal function. There is moderately increased  left ventricular hypertrophy.  2. Left ventricular diastolic function could not be evaluated.  3. Global right ventricle has mildly reduced systolic function.The right  ventricular size is normal. No increase in right ventricular wall  thickness.  4. Left atrial size was mildly dilated.  5. Right atrial size was moderately dilated.  6. The mitral valve is normal in structure. Mild mitral valve  regurgitation. No evidence of mitral stenosis.    7. The tricuspid valve is normal in structure. Tricuspid valve  regurgitation is mild.  8. The aortic valve is tricuspid. Aortic valve regurgitation is not  visualized. No evidence of aortic valve sclerosis or stenosis.  9. The pulmonic valve was normal in structure. Pulmonic valve  regurgitation is not visualized.  10. Moderately elevated pulmonary artery systolic pressure.  11. The atrial septum is grossly normal.   FINDINGS  Left Ventricle: Left ventricular ejection fraction, by visual estimation,  is 55 to 60%. The left ventricle has normal function. There is moderately  increased left ventricular hypertrophy. Concentric left ventricular  hypertrophy. The left ventricular  diastology could not be evaluated due to atrial fibrillation. Left  ventricular diastolic function could not be evaluated.   Right Ventricle: The right ventricular size is normal. No increase in  right ventricular wall thickness. Global RV systolic function is has  mildly reduced systolic function. The tricuspid regurgitant velocity is  1.47 m/s, and with an assumed right atrial  pressure of 33 mmHg, the estimated right ventricular systolic pressure is  moderately elevated at 41.6 mmHg.   Left Atrium: Left atrial size was mildly dilated.   Right Atrium: Right atrial size was moderately dilated   Pericardium: There is no evidence of pericardial effusion.   Mitral Valve: The mitral valve is normal in structure. No evidence of  mitral valve stenosis by observation. Mild mitral valve regurgitation.   Tricuspid Valve: The tricuspid valve is normal in structure. Tricuspid  valve regurgitation is mild.   Aortic Valve: The aortic valve is tricuspid. Aortic valve regurgitation is  not visualized. The aortic valve is structurally normal, with no evidence  of sclerosis or stenosis.   Pulmonic Valve: The pulmonic valve was normal in structure. Pulmonic valve  regurgitation is not visualized.   Aorta: The  aortic root and ascending aorta are structurally normal, with  no evidence of dilitation.   IAS/Shunts: The atrial septum is grossly normal.   Laboratory Data:  High Sensitivity Troponin:  No results for input(s): TROPONINIHS in the last 720 hours.   Chemistry Recent Labs  Lab 09/16/19 0412 09/17/19 0557 09/18/19 0356  NA 134* 133* 134*  K 4.6 4.8 5.2*  CL 100 102 98  CO2 27 25 23   GLUCOSE 133* 103* 101*  BUN 55* 51* 56*  CREATININE 3.52* 3.37* 3.52*  CALCIUM 8.4* 8.4* 8.6*  GFRNONAA 18* 19* 18*  GFRAA 21* 22* 21*  ANIONGAP 7 6 13     No results for input(s): PROT, ALBUMIN, AST, ALT, ALKPHOS, BILITOT in the last 168 hours. Hematology Recent Labs  Lab 09/16/19 [phone] 09/17/19 0557 09/18/19  0356  WBC 11.8* 14.8* 13.2*  RBC 3.30* 3.22* 3.24*  HGB 8.9* 8.8* 8.7*  HCT 29.7* 29.0* 29.2*  MCV 90.0 90.1 90.1  MCH 27.0 27.3 26.9  MCHC 30.0 30.3 29.8*  RDW 15.7* 15.8* 15.8*  PLT 452* 420* 350   BNP Recent Labs  Lab 09/12/19 0358  BNP 288.9*    DDimer No results for input(s): DDIMER in the last 168 hours.   Radiology/Studies:  DG Chest 2 View  Result Date: 09/15/2019 CLINICAL DATA:  Volume overload EXAM: CHEST - 2 VIEW COMPARISON:  Multiple priors, most recent 09/07/2019 FINDINGS: Prominent, cephalized vascularity compatible with vascular congestion. Lung volumes are low with streaky opacities in the bases favoring atelectasis. Few septal lines are present with some central vascular cuffing. Stable cardiomediastinal contours from prior without frank cardiomegaly on this portable exam. No pneumothorax or effusion. No acute osseous or soft tissue abnormality. Degenerative changes are present in the imaged spine and shoulders. Prior right shoulder rotator cuff repair. Telemetry leads overlie the chest. IMPRESSION: 1. Vascular congestion. Subtle features of at most mild interstitial edema. No effusions. 2. Low lung volumes with streaky opacities in the bases favoring atelectasis.  Electronically Signed   By: Lovena Le M.D.   On: 09/15/2019 16:21         NO CHEST PAIN Assessment and Plan:   1. PAF maintaining sR - was SB but now HR increasing would resume BB in addition to amiodarone.  on eliquis continue and no bleeding.  2. Volume overload per renal, echo pending hx of normal EF and LVH, + moderatly elevated pulmonary artery systolic pressure.  ? consider Rt heart  Cath -- is improving still has increased fluid but no SOB flat in bed.  3. HTN has been elevated amlodipine to 10 mg daily, and hydralazine added, no ACE or ARB for now, BB stopped (was on 50 BID-lopressor)  Would add back lopressor at lower dose to help with BP and now HR is climbing. Defer to Dr. Oval Linsey.  4. HLD on lipitor 20 mg continue 5. LLExt cellulitis on linezolid had vanc. Per IM  6. No hx of CAD with normal nuc study in 2019 7. Elevated LFTs on the 13th will recheck in AM while on amiodarone. 8. Elevated TSH at 6.700 will recheck on amiodarone.       For questions or updates, please contact [place] Please consult [email or web address] for contact info under    Signed, Cecilie Kicks, NP  09/18/2019 8:13 AM

## 2019-09-19 ENCOUNTER — Ambulatory Visit: Payer: Medicaid Other | Admitting: Physician Assistant

## 2019-09-19 LAB — BASIC METABOLIC PANEL
Anion gap: 13 (ref 5–15)
BUN: 59 mg/dL — ABNORMAL HIGH (ref 6–20)
CO2: 25 mmol/L (ref 22–32)
Calcium: 8.9 mg/dL (ref 8.9–10.3)
Chloride: 99 mmol/L (ref 98–111)
Creatinine, Ser: 3.51 mg/dL — ABNORMAL HIGH (ref 0.61–1.24)
GFR calc Af Amer: 21 mL/min — ABNORMAL LOW (ref >60)
GFR calc non Af Amer: 18 mL/min — ABNORMAL LOW (ref >60)
Glucose, Bld: 90 mg/dL (ref 70–99)
Potassium: 5.3 mmol/L — ABNORMAL HIGH (ref 3.5–5.1)
Sodium: 137 mmol/L (ref 135–145)

## 2019-09-19 LAB — CBC
HCT: 28.6 % — ABNORMAL LOW (ref 39.0–52.0)
Hemoglobin: 8.7 g/dL — ABNORMAL LOW (ref 13.0–17.0)
MCH: 27.2 pg (ref 26.0–34.0)
MCHC: 30.4 g/dL (ref 30.0–36.0)
MCV: 89.4 fL (ref 80.0–100.0)
Platelets: 370 10*3/uL (ref 150–400)
RBC: 3.2 MIL/uL — ABNORMAL LOW (ref 4.22–5.81)
RDW: 16.1 % — ABNORMAL HIGH (ref 11.5–15.5)
WBC: 13.1 10*3/uL — ABNORMAL HIGH (ref 4.0–10.5)
nRBC: 0 % (ref 0.0–0.2)

## 2019-09-19 LAB — GLUCOSE, CAPILLARY
Glucose-Capillary: 105 mg/dL — ABNORMAL HIGH (ref 70–99)
Glucose-Capillary: 127 mg/dL — ABNORMAL HIGH (ref 70–99)
Glucose-Capillary: 86 mg/dL (ref 70–99)
Glucose-Capillary: 74 mg/dL (ref 70–99)
Glucose-Capillary: 81 mg/dL (ref 70–99)

## 2019-09-19 LAB — HEPATIC FUNCTION PANEL
ALT: 21 U/L (ref 0–44)
AST: 23 U/L (ref 15–41)
Albumin: 2.4 g/dL — ABNORMAL LOW (ref 3.5–5.0)
Alkaline Phosphatase: 61 U/L (ref 38–126)
Bilirubin, Direct: 0.2 mg/dL (ref 0.0–0.2)
Indirect Bilirubin: 0.8 mg/dL (ref 0.3–0.9)
Total Bilirubin: 1 mg/dL (ref 0.3–1.2)
Total Protein: 7.2 g/dL (ref 6.5–8.1)

## 2019-09-19 LAB — TSH: TSH: 7.37 u[IU]/mL — ABNORMAL HIGH (ref 0.350–4.500)

## 2019-09-19 LAB — T4, FREE: Free T4: 1 ng/dL (ref 0.61–1.12)

## 2019-09-19 MED ORDER — SODIUM ZIRCONIUM CYCLOSILICATE 10 G PO PACK
10.0000 g | PACK | Freq: Once | ORAL | Status: AC
  Administered 2019-09-19: 10 g via ORAL
  Filled 2019-09-19: qty 1

## 2019-09-19 NOTE — Progress Notes (Signed)
PHYSICAL THERAPY  Checked on twice today  Am working with OT standing at sink washing up Pm "too tired" from working with OT "that's all I can do today"   Rica Koyanagi  PTA Acute  Rehabilitation Services Pager      [phone] Office      [phone]

## 2019-09-19 NOTE — Progress Notes (Signed)
The patient refused the dressing change to his bilateral legs, he stated that the wound person dressed them at 7p yesterday and it didn't have to be done again.

## 2019-09-19 NOTE — Progress Notes (Signed)
Occupational Therapy Treatment Patient Details Name: Jeffery [name] MRN: [id number] DOB: September 04, 1960 Today's Date: 09/19/2019    History of present illness 59 y.o. male with medical history significant of Obesity, DM2, HTN, P Afib, EtOH use, Charcot's foot, Chronic Pain syndrome, Hx of Cardioversion, diastolic CHF, chronic LE blister and edema s/p excision by podiatry comes with worsening of left lower ext foot pain   OT comments  Today patient performing ADLs standing at sink but limited more by left knee pain and requiring rest breaks bent over sink. Needed assistance to donn shorts. Patient reports needing to traverse stairs to get to the bathroom at home. Would consider SNF more of an option due to patient's mobility limitations.   Follow Up Recommendations  Home health OT;SNF    Equipment Recommendations  3 in 1 bedside commode;Other (comment)    Recommendations for Other Services      Precautions / Restrictions Precautions Precautions: Fall Restrictions Weight Bearing Restrictions: No       Mobility Bed Mobility Overal bed mobility: Modified Independent Bed Mobility: Sit to Supine     Supine to sit: HOB elevated;Supervision Sit to supine: Modified independent (Device/Increase time);HOB elevated   General bed mobility comments: pt requires increased time, no assist.  Transfers Overall transfer level: Needs assistance Equipment used: Rolling walker (2 wheeled) Transfers: Sit to/from Stand Sit to Stand: Supervision;From elevated surface         General transfer comment: bed height elevated, supervision for safety.    Balance Overall balance assessment: No apparent balance deficits (not formally assessed)                                         ADL either performed or assessed with clinical judgement   ADL       Grooming: Standing;Applying deodorant;Oral care;Wash/dry face;Wash/dry hands;Supervision/safety   Upper Body Bathing:  Supervision/ safety;Standing Upper Body Bathing Details (indicate cue type and reason): at sink side, no loss of balance. Needed rest breaks bent over sink due to LE pain.         Lower Body Dressing: Moderate assistance;Set up;Sit to/from stand Lower Body Dressing Details (indicate cue type and reason): to don clean shorts , needed assistance to get shorts over feet but able to pull shoerts up. Toilet Transfer: Copy Details (indicate cue type and reason): with sit to stand from edge of bed with bed height elevated         Functional mobility during ADLs: Supervision/safety;Rolling walker General ADL Comments: Patient ambulated to bathroom to perform grooming/bathing tasks. Limited by pain in left knee today. Needed rest breaks bent over sink to reduce weight bearing.     Vision       Perception     Praxis      Cognition Arousal/Alertness: Awake/alert Behavior During Therapy: WFL for tasks assessed/performed Overall Cognitive Status: Within Functional Limits for tasks assessed                                          Exercises     Shoulder Instructions       General Comments      Pertinent Vitals/ Pain       Pain Assessment: 0-10 Pain Score: 10-Worst pain ever Pain Location: Lt knee Pain Descriptors / Indicators:  Aching;Crying;Grimacing Pain Intervention(s): Limited activity within patient's tolerance  Home Living                                          Prior Functioning/Environment              Frequency  Min 2X/week        Progress Toward Goals  OT Goals(current goals can now be found in the care plan section)  Progress towards OT goals: Progressing toward goals  Acute Rehab OT Goals Patient Stated Goal: go home OT Goal Formulation: With patient Time For Goal Achievement: 09/22/19 Potential to Achieve Goals: Good  Plan Discharge plan remains appropriate     Co-evaluation                 AM-PAC OT "6 Clicks" Daily Activity     Outcome Measure   Help from another person eating meals?: None Help from another person taking care of personal grooming?: None Help from another person toileting, which includes using toliet, bedpan, or urinal?: A Little Help from another person bathing (including washing, rinsing, drying)?: A Little Help from another person to put on and taking off regular upper body clothing?: A Little Help from another person to put on and taking off regular lower body clothing?: A Lot 6 Click Score: 19    End of Session Equipment Utilized During Treatment: Rolling walker;Gait belt  OT Visit Diagnosis: Other abnormalities of gait and mobility (R26.89);Muscle weakness (generalized) (M62.81);Pain Pain - Right/Left: Left Pain - part of body: Knee   Activity Tolerance Patient limited by pain   Patient Left in bed;with call bell/phone within reach;with bed alarm set   Nurse Communication  (okay to see patient today)        Time: 2023-3435 OT Time Calculation (min): 36 min  Charges: OT General Charges $OT Visit: 1 Visit OT Treatments $Self Care/Home Management : 23-37 mins  Abygale Karpf, OTR/L [place]  Office [phone] Pager: [place] 09/19/2019, 4:24 PM

## 2019-09-19 NOTE — TOC Progression Note (Signed)
Transition of Care [facility]) - Progression Note    Patient Details  Name: Jeffery [name] MRN: [id number] Date of Birth: 22-May-1960  Transition of Care [facility]) CM/SW Contact  Lennart Pall, LCSW Phone Number: 09/19/2019, 11:13 AM  Clinical Narrative:  Met with pt this morning to review dc plans.  Pt continues to be resistant to thought of SNF from here as he is concerned about loss if monthly SSD check and being able to pay bills.  He, also, feels that his legs are getting better and could possibly d/c home.  He feels that he still needs to be in the hospital with his current medical needs and work up underway.  Has not been seen by therapy for a few days so would like to have their input about his current level of assistance - pt notes they are to see him this afternoon.  Will follow back up with pt once these visits have been done.     Expected Discharge Plan:  (Rehab v [facility]) Barriers to Discharge: Other (comment) (Patient not willing to make decison yet about disposition)  Expected Discharge Plan and Services Expected Discharge Plan:  (Rehab v [facility])                                               Social Determinants of Health (SDOH) Interventions    Readmission Risk Interventions No flowsheet data found.

## 2019-09-19 NOTE — Progress Notes (Signed)
[facility] Progress Note  Subjective:  1900 of UOP recorded but looks like missed a shift- weight down from yest but variable - crt stable-  Still some nausea but maybe better-  constipated   Vitals:   09/18/19 1328 09/18/19 2041 09/19/19 0500 09/19/19 0520  BP: (!) 153/67 (!) 147/77  134/76  Pulse: 82 82  79  Resp: _0 Temp: 97.6 F (36.4 C) 97.8 F (36.6 C)  97.9 F (36.6 C)  TempSrc: Oral Oral  Oral  SpO2: 92% 97%  99%  Weight:   (!) 160.8 kg   Height:        Exam: Gen alert, no distress No jvd or bruits Chest clear bilat to bases  RRR no MRG Abd soft ntnd no mass or ascites +bs GU normal male Ext diffuse 2 LE and smaller UE edema Neuro is alert, Ox 3 , nf    Date              Creat               eGFR  2012- 18         0.69- 0.90            2019- 2020     0.96- 1.13 May 2019         0.98  May 2021       1.10-  2.31       40- >60 ml/min  September 04, 2019  1.63                 46 Since then worsening - has been 3.3 to 3.8 since 6/12-  Not a clear trend              UA Oct 2018 - negative   UA 08/08/19 - cloudy, 100 prot, few bact, 0-5 rbc, 11-20 wbc   UA 09/10/19 - hazy, large Hb, 30 prot, >50 rbc, 11-20 wbc    Renal US 6/12 - 11.3/ 12.0 cm kidneys, limited exam d/t body habitus, no hydro, relative preservation of cortical thickness   ECHO 02/2019 - LVEF 55-60%, +LVH, mild reduced RV fxn, no valve issues    I/O since admit are 11.2L in and 15.8 L out  = - 4.5 L net    Wt's are down 168kg > 163kd today = - 5kg   BP's have been high since admission   UP/C ratio = 0.8   Assessment/ Plan: 1. AoCKD 3 - baseline creat 1.6, eGFR 46 ml/min. Baseline CKD likely due to DM/ HTN.  Here creat 2.5 >> 3.8, in setting of marked vol overload, hx of RV dysfunction by echo, normal LVEF. UP/C ratio 0.8. Possible cardiorenal element.  UA +wbc/ rbc, repeat shows same.  Recovery is stalled for some reason- crt 3.2 to 3.8 for several days-  Is it because BP is  better controlled ?  Possibly some relative hypotension for him ? With hematuria may need to check some serologies for completeness sake.  No dialysis needs- cont to watch 2. Vol overload - as above- Still has a significant edema but much better than admission. Down in weight. Needs to restrict fluid intake here. CXR negative yest. Cont lasix IV tid for now 3. HTN - BP's have been high since arrival high, ^'d norvasc 5 bid, added hydralazine - now at 50 tid. Avoid acei /ARB for now. Cont diuresing - reasonable for now - no change-  Do not  want to get BP too low  4. DM2 on insulin 5. Atrial fibrillation - on amio, eliquis 6. LLE cellulitis - sp vanc, on linezolid  7. Morbid obesity 8. R HF - by last echo 02/2019 9. Hyperkalemia-  Got dose of lokelma today  10. Nausea-  Thinking it is the zyvox vs constipation-  Got zofran with meds today     Louis Meckel  09/19/2019, 11:39 AM   Recent Labs  Lab 09/18/19 0356 09/19/19 0336  K 5.2* 5.3*  BUN 56* 59*  CREATININE 3.52* 3.51*  CALCIUM 8.6* 8.9  HGB 8.7* 8.7*   Inpatient medications: . amiodarone  200 mg Oral BID  . amLODipine  10 mg Oral Daily  . apixaban  5 mg Oral BID  . atorvastatin  20 mg Oral Daily  . furosemide  60 mg Intravenous Q8H  . gabapentin  300 mg Oral BID  . hydrALAZINE  50 mg Oral Q8H  . insulin aspart  0-15 Units Subcutaneous TID WC  . insulin aspart  0-5 Units Subcutaneous QHS  . insulin glargine  40 Units Subcutaneous BID  . linezolid  600 mg Oral Q12H  . polyethylene glycol  17 g Oral Daily  . senna-docusate  1 tablet Oral BID    acetaminophen **OR** acetaminophen, bisacodyl, labetalol, morphine injection, ondansetron **OR** ondansetron (ZOFRAN) IV, oxyCODONE-acetaminophen **AND** oxyCODONE

## 2019-09-19 NOTE — Progress Notes (Signed)
Subjective: 59 year old male admitted to the hospital for AKI, sepsis from left lower extremity cellulitis.  Sepsis physiologically improved however still dilated to 2 AKI, fluid overload currently being seen by nephrology, cardiology.  Clean by the change dressing today.  Complains of pain in his left knee and back but not to his feet.  Currently denies any fevers, chills please been having some nausea due to the Zyvox he believes.  Objective: AAO x3, NAD-sleeing came in the room Dressings are clean, dry, intact.  Dressings were removed and there is significant improvement in the swelling.  Superficial wounds are still evident to bilateral legs but overall significantly improved compared to last dressing change.  The graft, full-thickness wound on the lateral aspect of right foot the graft has tried and I removed the sutures as well to try graft lesions.  Fibrotic tissue was fibrotic.  There is no purulence.  Overall cellulitis appears to be almost resolved.  There is no areas of fluctuation crepitation.  There is no malodor.No pain with calf compression  Assessment: Cellulitis, sepsis- clinically improved  Plan: I change the dressings today.  I cleaned the skin and reapplied Xeroform to the wounds.  To the graft site apply Adaptic as well as petroleum jelly.  ABD pads were applied followed by Kerlix and Ace bandage.  Continue with daily dressing changes.  Continue elevation.  Continue Zyvox.  I will plan on seeing him later this week if still in the hospital.  If discharged will follow me in the office.  Celesta Gentile, DPM O: [phone] C: [phone]

## 2019-09-19 NOTE — Progress Notes (Signed)
PROGRESS NOTE    Jeffery [name]   LEX:[id number]  DOB: February 01, 1961  PCP: [name], MD    DOA: 09/07/2019 LOS: 12   Brief Narrative   59 y.o.malewith medical history significant ofObesity, DM2, HTN, P Afib, EtOH use, Charcot's foot, Chronic Pain syndrome, Hx of Cardioversion, diastolic CHF, chronic LE blister and edema s/p excision by podiatry comes with worsening of left lower ext foot pain.  Admitted for sepsis secondary to purulent left lower extremity cellulitis.  Hospital course complicated by AKI.  MRI left leg suggested extensive cellulitis with moderate left knee effusion.  Orthopedic consulted who aspirated left knee fluid suggestive of inflammatory fluid.  Getting Zyvox for his cellulitis.  Nephrology following.  Getting diuretics.   Assessment & Plan    Sepsis secondary to left Lower extremity Cellulitis with some purulent; POA B/L lower extremity chronic skin changes with blister Sepsis physiology improved. MRI-left lower extremity cellulitis but no evidence of abscess/osteomyelitis Ultrasound ABI-difficult to obtain due to blisters Blood cultures negative. Patient seen by podiatry, Dr. Jacqualyn Posey.  Outpatient follow-up with him after discharge. -Initially on IV vancomycin, now on Zyvox (since 6/15).  --Plan for at least 10 to 14-day course. -Pain control, bowel regimen -Daily dressing changes  Volume Overload  Secondary to worsening renal function vs CHF vs R heart failure / pulmonary hypertension.  Remains on IV diuretics.  Echocardiogram showed normal systolic function.  No evidence for pulmonary hypertension noted.  Patient remains on IV diuretics.  Nephrology is following.  Patient was also seen by cardiology.  Acute Kidney Injury on CKD Stage IIIa/mild hyperkalemia -Baseline creatinine 1.6 Nephrology was consulted.  Creatinine has been around 3.5 the last 2 days.  Potassium noted to be minimally elevated.  Give him a dose of Lokelma.  Renal ultrasound  was unremarkable.  Echocardiogram shows normal systolic function.  No concern for cardiorenal syndrome at this time.  Further management per nephrology.  Left shoulder and left ankle pain, improved Acute mild gout flare Moderate left knee effusion-inflammatory versus infectious X-ray showed chronic changes.   MRI showed soft tissue inflammation concerning for cellulitis with moderate left knee effusion. -Seen by Ortho, s/p aspiration 6/14 suggestive of inflammatory fluid.   Received colchicine doing better. -Pain control -Continue work with PT/OT-SNF.  TOC team working on this.    Constipation Due to pain medications and lack of mobility.  Scheduled Miralax and Senna, PRN Dulcolax.  Suppository and/or enemas if no improvement.  Sinus bradycardia Continue amiodarone.  Metoprolol was discontinued.  Essential Hypertension Remains on amlodipine.  Hydralazine as needed.  Blood pressure is reasonably well controlled.  Also on diuretics.  Metoprolol had to be held due to bradycardia.    DM2 with neuropathy, controlled -Insulin sliding scale and Accu-Chek. Lantus 50 units twice daily reasonably well controlled.  On gabapentin for neuropathy.  HbA1c 8.0.  ParoxysmalA Fib, s/p Cardioversion -On  amiodarone and Eliquis.  LFTs are normal.  TSH noted to be 7.37 with a normal free T4.  Recommend rechecking thyroid function tests in a few weeks.  Anemia likely due to chronic kidney disease Hemoglobin is stable.  No evidence for overt bleeding.  Morbid Obesity: Body mass index is 45.5 kg/m.  This complicates overall care and prognosis.  PT-recommending SNF  DVT prophylaxis:  apixaban (ELIQUIS) tablet 5 mg   Diet:  Diet Orders (From admission, onward)    Start     Ordered   09/17/19 1559  Diet Carb Modified Fluid consistency: Thin; Room service appropriate?  Yes; Fluid restriction: 1500 mL Fluid  Diet effective now       Question Answer Comment  Diet-HS Snack? Nothing   Calorie  Level Medium 1600-2000   Fluid consistency: Thin   Room service appropriate? Yes   Fluid restriction: 1500 mL Fluid      09/17/19 1558            Code Status: Full Code    Subjective 09/19/19    Patient states that overall he is feeling better though still fatigued.  Denies any nausea vomiting.  States that his podiatrist came by yesterday evening.  Legs feel better.  Disposition Plan & Communication   Status is: Inpatient  Remains inpatient appropriate because:IV treatments appropriate due to intensity of illness or inability to take PO and Inpatient level of care appropriate due to severity of illness.  Remains on IV diuretics.   Dispo: The patient is from: Home              Anticipated d/c is to: SNF              Anticipated d/c date is: 3 days              Patient currently is not medically stable to d/c.    Family Communication: Discussed in detail with patient.   Consults, Procedures, Significant Events   Consultants:   Nephrology  Cardiology   Antimicrobials:   Zyvox   Objective   Vitals:   09/18/19 1328 09/18/19 2041 09/19/19 0500 09/19/19 0520  BP: (!) 153/67 (!) 147/77  134/76  Pulse: 82 82  79  Resp: 20 19  20   Temp: 97.6 F (36.4 C) 97.8 F (36.6 C)  97.9 F (36.6 C)  TempSrc: Oral Oral  Oral  SpO2: 92% 97%  99%  Weight:   (!) 160.8 kg   Height:        Intake/Output Summary (Last 24 hours) at 09/19/2019 1115 Last data filed at 09/19/2019 0819 Gross per 24 hour  Intake 1440 ml  Output 1450 ml  Net -10 ml   Filed Weights   09/17/19 0500 09/18/19 0532 09/19/19 0500  Weight: (!) 159.8 kg (!) 161 kg (!) 160.8 kg    Physical Exam:  General appearance: Awake alert.  In no distress Resp: Clear to auscultation bilaterally.  Normal effort Cardio: S1-S2 is normal regular.  No S3-S4.  No rubs murmurs or bruit GI: Abdomen is soft.  Nontender nondistended.  Bowel sounds are present normal.  No masses organomegaly Extremities: Improving  lower extremity edema.  Legs are covered in wraps.  No obvious erythema noted.   Neurologic: Alert and oriented x3.  No focal neurological deficits.    Labs   Data Reviewed: I have personally reviewed following labs and imaging studies  CBC: Recent Labs  Lab 09/15/19 0403 09/16/19 0412 09/17/19 0557 09/18/19 0356 09/19/19 0336  WBC 12.4* 11.8* 14.8* 13.2* 13.1*  HGB 8.9* 8.9* 8.8* 8.7* 8.7*  HCT 29.8* 29.7* 29.0* 29.2* 28.6*  MCV 89.2 90.0 90.1 90.1 89.4  PLT 468* 452* 420* 350 546   Basic Metabolic Panel: Recent Labs  Lab 09/13/19 0339 09/13/19 0339 09/14/19 0356 09/14/19 0356 09/15/19 0403 09/16/19 0412 09/17/19 0557 09/18/19 0356 09/19/19 0336  NA 137   < > 137   < > 137 134* 133* 134* 137  K 4.6   < > 4.8   < > 4.7 4.6 4.8 5.2* 5.3*  CL 104   < > 103   < >  104 100 102 98 99  CO2 21*   < > 24   < > 23 27 25 23 25   GLUCOSE 123*   < > 112*   < > 140* 133* 103* 101* 90  BUN 66*   < > 67*   < > 61* 55* 51* 56* 59*  CREATININE 3.36*   < > 3.80*   < > 3.60* 3.52* 3.37* 3.52* 3.51*  CALCIUM 8.2*   < > 8.3*   < > 8.5* 8.4* 8.4* 8.6* 8.9  MG 2.6*  --  2.3  --  2.2 2.2 2.0  --   --    < > = values in this interval not displayed.   GFR: Estimated Creatinine Clearance: 37.3 mL/min (A) (by C-G formula based on SCr of 3.51 mg/dL (H)). CBG: Recent Labs  Lab 09/17/19 2046 09/18/19 0759 09/18/19 1158 09/18/19 2043 09/19/19 0758  GLUCAP 156* 72 130* 100* 127*     Recent Results (from the past 240 hour(s))  Body fluid culture     Status: None   Collection Time: 09/11/19  1:47 PM   Specimen: Synovium; Body Fluid  Result Value Ref Range Status   Specimen Description   Final    SYNOVIAL KNEE LEFT Performed at [facility] [address], [place], [place] [address]    Special Requests NONE  Final   Gram Stain   Final    ABUNDANT WBC PRESENT, PREDOMINANTLY PMN NO ORGANISMS SEEN    Culture   Final    NO GROWTH Performed at [facility], [place] [address], [place], [place] [address]    Report Status 09/14/2019 FINAL  Final  SARS CORONAVIRUS 2 (TAT 6-24 HRS) Nasopharyngeal Nasopharyngeal Swab     Status: None   Collection Time: 09/14/19  5:52 PM   Specimen: Nasopharyngeal Swab  Result Value Ref Range Status   SARS Coronavirus 2 NEGATIVE NEGATIVE Final    Comment: (NOTE) SARS-CoV-2 target nucleic acids are NOT DETECTED.  The SARS-CoV-2 RNA is generally detectable in upper and lower respiratory specimens during the acute phase of infection. Negative results do not preclude SARS-CoV-2 infection, do not rule out co-infections with other pathogens, and should not be used as the sole basis for treatment or other patient management decisions. Negative results must be combined with clinical observations, patient history, and epidemiological information. The expected result is Negative.  Fact Sheet for Patients: SugarRoll.be  Fact Sheet for Healthcare Providers: https://www.woods-mathews.com/  This test is not yet approved or cleared by the [place] FDA and  has been authorized for detection and/or diagnosis of SARS-CoV-2 by FDA under an Emergency Use Authorization (EUA). This EUA will remain  in effect (meaning this test can be used) for the duration of the COVID-19 declaration under Se ction 564(b)(1) of the Act, 21 U.S.C. section 360bbb-3(b)(1), unless the authorization is terminated or revoked sooner.  Performed at [facility], [place] [address] [place], [place] [address]       Imaging Studies   ECHOCARDIOGRAM COMPLETE  Result Date: 09/18/2019    ECHOCARDIOGRAM REPORT   Patient Name:   Jeffery [name] Date of Exam: 09/18/2019 Medical Rec #:  [id number]           Height:       74.0 in Accession #:    [id number]          Weight:       354.9 lb Date of Birth:  1960/07/22  BSA:          2.773 m Patient Age:    57 years            BP:           146/74 mmHg  Patient Gender: M                   HR:           84 bpm. Exam Location:  Inpatient Procedure: 2D Echo, Cardiac Doppler and Color Doppler Indications:    Volume Overload. [id number]  History:        Patient has prior history of Echocardiogram examinations, most                 recent 02/28/2019.  Sonographer:    Tiffany Dance Referring Phys: [id number] Maili  1. Normal LV function; moderate LVE; mild LAE.  2. Left ventricular ejection fraction, by estimation, is 60 to 65%. The left ventricle has normal function. The left ventricle has no regional wall motion abnormalities. The left ventricular internal cavity size was moderately dilated. Left ventricular diastolic parameters were normal.  3. Right ventricular systolic function is normal. The right ventricular size is normal.  4. Left atrial size was mildly dilated.  5. The mitral valve is normal in structure. No evidence of mitral valve regurgitation. No evidence of mitral stenosis.  6. The aortic valve is normal in structure. Aortic valve regurgitation is not visualized. No aortic stenosis is present.  7. The inferior vena cava is normal in size with greater than 50% respiratory variability, suggesting right atrial pressure of 3 mmHg. FINDINGS  Left Ventricle: Left ventricular ejection fraction, by estimation, is 60 to 65%. The left ventricle has normal function. The left ventricle has no regional wall motion abnormalities. The left ventricular internal cavity size was moderately dilated. There is no left ventricular hypertrophy. Left ventricular diastolic parameters were normal. Right Ventricle: The right ventricular size is normal.Right ventricular systolic function is normal. Left Atrium: Left atrial size was mildly dilated. Right Atrium: Right atrial size was normal in size. Pericardium: Trivial pericardial effusion is present. Mitral Valve: The mitral valve is normal in structure. Normal mobility of the mitral valve leaflets. No evidence of  mitral valve regurgitation. No evidence of mitral valve stenosis. Tricuspid Valve: The tricuspid valve is normal in structure. Tricuspid valve regurgitation is trivial. No evidence of tricuspid stenosis. Aortic Valve: The aortic valve is normal in structure. Aortic valve regurgitation is not visualized. No aortic stenosis is present. Pulmonic Valve: The pulmonic valve was normal in structure. Pulmonic valve regurgitation is not visualized. No evidence of pulmonic stenosis. Aorta: The aortic root is normal in size and structure. Venous: The inferior vena cava is normal in size with greater than 50% respiratory variability, suggesting right atrial pressure of 3 mmHg. IAS/Shunts: No atrial level shunt detected by color flow Doppler. Additional Comments: Normal LV function; moderate LVE; mild LAE.  LEFT VENTRICLE PLAX 2D LVIDd:         6.00 cm  Diastology LVIDs:         4.30 cm  LV e' lateral:   11.50 cm/s LV PW:         1.10 cm  LV E/e' lateral: 8.6 LV IVS:        1.20 cm  LV e' medial:    9.46 cm/s LVOT diam:     2.20 cm  LV E/e' medial:  10.5 LV SV:  98 LV SV Index:   35 LVOT Area:     3.80 cm  RIGHT VENTRICLE             IVC RV Basal diam:  3.60 cm     IVC diam: 2.00 cm RV Mid diam:    3.10 cm RV S prime:     19.10 cm/s TAPSE (M-mode): 2.3 cm LEFT ATRIUM              Index       RIGHT ATRIUM           Index LA diam:        5.20 cm  1.87 cm/m  RA Area:     24.40 cm LA Vol (A2C):   135.0 ml 48.68 ml/m RA Volume:   70.20 ml  25.31 ml/m LA Vol (A4C):   107.0 ml 38.58 ml/m LA Biplane Vol: 123.0 ml 44.35 ml/m  AORTIC VALVE LVOT Vmax:   116.00 cm/s LVOT Vmean:  76.200 cm/s LVOT VTI:    0.259 m  AORTA Ao Root diam: 4.00 cm Ao Asc diam:  3.40 cm MITRAL VALVE MV Area (PHT): 2.11 cm    SHUNTS MV Decel Time: 359 msec    Systemic VTI:  0.26 m MV E velocity: 98.90 cm/s  Systemic Diam: 2.20 cm MV A velocity: 87.00 cm/s MV E/A ratio:  1.14 Kirk Ruths MD Electronically signed by Kirk Ruths MD Signature  Date/Time: 09/18/2019/3:36:52 PM    Final      Medications   Scheduled Meds: . amiodarone  200 mg Oral BID  . amLODipine  10 mg Oral Daily  . apixaban  5 mg Oral BID  . atorvastatin  20 mg Oral Daily  . furosemide  60 mg Intravenous Q8H  . gabapentin  300 mg Oral BID  . hydrALAZINE  50 mg Oral Q8H  . insulin aspart  0-15 Units Subcutaneous TID WC  . insulin aspart  0-5 Units Subcutaneous QHS  . insulin glargine  40 Units Subcutaneous BID  . linezolid  600 mg Oral Q12H  . polyethylene glycol  17 g Oral Daily  . senna-docusate  1 tablet Oral BID   Continuous Infusions:     LOS: 12 days      Bonnielee Haff, [facility]  09/19/2019, 11:15 AM    If 7PM-7AM, please contact night-coverage. How to contact the [facility] Attending or Consulting provider [place] or covering provider during after hours [place], for this patient?    1. Check the care team in [facility] and look for a) attending/consulting TRH provider listed and b) the [facility] team listed 2. Log into [email or web address] and use [place]'s universal password to access. If you do not have the password, please contact the hospital operator. 3. Locate the [facility] provider you are looking for under [facility] and page to a number that you can be directly reached. 4. If you still have difficulty reaching the provider, please page the [facility] (Director on Call) for the Hospitalists listed on amion for assistance.

## 2019-09-20 ENCOUNTER — Encounter (HOSPITAL_BASED_OUTPATIENT_CLINIC_OR_DEPARTMENT_OTHER): Payer: Medicaid Other | Admitting: Physician Assistant

## 2019-09-20 DIAGNOSIS — R112 Nausea with vomiting, unspecified: Secondary | ICD-10-CM

## 2019-09-20 LAB — CBC
HCT: 28.8 % — ABNORMAL LOW (ref 39.0–52.0)
Hemoglobin: 8.7 g/dL — ABNORMAL LOW (ref 13.0–17.0)
MCH: 26.7 pg (ref 26.0–34.0)
MCHC: 30.2 g/dL (ref 30.0–36.0)
MCV: 88.3 fL (ref 80.0–100.0)
Platelets: 321 10*3/uL (ref 150–400)
RBC: 3.26 MIL/uL — ABNORMAL LOW (ref 4.22–5.81)
RDW: 16.3 % — ABNORMAL HIGH (ref 11.5–15.5)
WBC: 11 10*3/uL — ABNORMAL HIGH (ref 4.0–10.5)
nRBC: 0 % (ref 0.0–0.2)

## 2019-09-20 LAB — BASIC METABOLIC PANEL
Anion gap: 11 (ref 5–15)
BUN: 66 mg/dL — ABNORMAL HIGH (ref 6–20)
CO2: 27 mmol/L (ref 22–32)
Calcium: 9.1 mg/dL (ref 8.9–10.3)
Chloride: 98 mmol/L (ref 98–111)
Creatinine, Ser: 3.98 mg/dL — ABNORMAL HIGH (ref 0.61–1.24)
GFR calc Af Amer: 18 mL/min — ABNORMAL LOW (ref >60)
GFR calc non Af Amer: 16 mL/min — ABNORMAL LOW (ref >60)
Glucose, Bld: 112 mg/dL — ABNORMAL HIGH (ref 70–99)
Potassium: 4.9 mmol/L (ref 3.5–5.1)
Sodium: 136 mmol/L (ref 135–145)

## 2019-09-20 LAB — GLUCOSE, CAPILLARY
Glucose-Capillary: 82 mg/dL (ref 70–99)
Glucose-Capillary: 105 mg/dL — ABNORMAL HIGH (ref 70–99)
Glucose-Capillary: 102 mg/dL — ABNORMAL HIGH (ref 70–99)
Glucose-Capillary: 136 mg/dL — ABNORMAL HIGH (ref 70–99)

## 2019-09-20 LAB — SEDIMENTATION RATE: Sed Rate: 118 mm/hr — ABNORMAL HIGH (ref 0–16)

## 2019-09-20 MED ORDER — FUROSEMIDE 10 MG/ML IJ SOLN
60.0000 mg | Freq: Two times a day (BID) | INTRAMUSCULAR | Status: DC
  Administered 2019-09-20 – 2019-09-23 (×6): 60 mg via INTRAVENOUS
  Filled 2019-09-20 (×6): qty 6

## 2019-09-20 NOTE — Progress Notes (Signed)
PROGRESS NOTE    Jeffery [name]   WCB:[id number]  DOB: [birth year]  PCP: [name], MD    DOA: 09/07/2019 LOS: 13   Brief Narrative   59 y.o.malewith medical history significant ofObesity, DM2, HTN, P Afib, EtOH use, Charcot's foot, Chronic Pain syndrome, Hx of Cardioversion, diastolic CHF, chronic LE blister and edema s/p excision by podiatry comes with worsening of left lower ext foot pain.  Admitted for sepsis secondary to purulent left lower extremity cellulitis.  Hospital course complicated by AKI.  MRI left leg suggested extensive cellulitis with moderate left knee effusion.  Orthopedic consulted who aspirated left knee fluid suggestive of inflammatory fluid.  Getting Zyvox for his cellulitis.  Nephrology following.  Getting diuretics.   Assessment & Plan    Sepsis secondary to left Lower extremity Cellulitis with some purulent; POA B/L lower extremity chronic skin changes with blister Sepsis physiology improved. MRI-left lower extremity cellulitis but no evidence of abscess/osteomyelitis Ultrasound ABI-difficult to obtain due to blisters Blood cultures negative. Patient seen by podiatry, Dr. Jacqualyn Posey.  Outpatient follow-up with him after discharge. He was initially on vancomycin which was changed over to Zyvox due to his renal issues.  Patient has completed 12 days of treatment.  He is experiencing a lot of nausea which could be due to the Zyvox.  Since his infection seems to be under control we will stop the Zyvox today.  Nausea and vomiting No abdominal pain.  Abdomen is benign on examination.  Could be due to Zyvox.  We will stop the medication today.  Wonder if uremia could be contributing as well.  Volume Overload  Secondary to worsening renal function vs CHF vs R heart failure / pulmonary hypertension.  Remains on IV diuretics.  Echocardiogram showed normal systolic function.  No evidence for pulmonary hypertension noted.  Patient remains on IV diuretics.   Nephrology is following.  Patient was also seen by cardiology.  Acute Kidney Injury on CKD Stage IIIa/mild hyperkalemia -Baseline creatinine 1.6. Nephrology is following.  He remains on IV furosemide.  Creatinine initially improved but then noted to be rising again.  Management per nephrology.  Potassium is normal today.  He was given [facility] yesterday.  Renal ultrasound was unremarkable. Echocardiogram shows normal systolic function.  No concern for cardiorenal syndrome at this time.   Further management per nephrology  Left shoulder and left ankle pain, improved Acute mild gout flare Moderate left knee effusion-inflammatory versus infectious X-ray showed chronic changes.   MRI showed soft tissue inflammation concerning for cellulitis with moderate left knee effusion. -Seen by Ortho, s/p aspiration 6/14 suggestive of inflammatory fluid.   Received colchicine doing better. -Pain control -Continue work with PT/OT-SNF.  TOC team working on this.    Constipation Due to pain medications and lack of mobility.  Continue laxatives and stool softeners.    Sinus bradycardia Continue amiodarone.  Metoprolol was discontinued.  Essential Hypertension Remains on amlodipine.  Hydralazine as needed.  Blood pressure is reasonably well controlled.  Also on diuretics.  Metoprolol had to be held due to bradycardia.  Better.  DM2 with neuropathy, controlled -Insulin sliding scale and Accu-Chek. Lantus 50 units twice daily reasonably well controlled.  On gabapentin for neuropathy.  HbA1c 8.0.  ParoxysmalA Fib, s/p Cardioversion -On  amiodarone and Eliquis.  LFTs are normal.  TSH noted to be 7.37 with a normal free T4.  Recommend rechecking thyroid function tests in a few weeks.  Anemia likely due to chronic kidney disease Hemoglobin is stable.  No evidence for overt bleeding.  Morbid Obesity: Body mass index is 45.38 kg/m.  This complicates overall care and prognosis.  PT-recommending  SNF  DVT prophylaxis:  apixaban (ELIQUIS) tablet 5 mg   Diet:  Diet Orders (From admission, onward)    Start     Ordered   09/17/19 1559  Diet Carb Modified Fluid consistency: Thin; Room service appropriate? Yes; Fluid restriction: 1500 mL Fluid  Diet effective now       Question Answer Comment  Diet-HS Snack? Nothing   Calorie Level Medium 1600-2000   Fluid consistency: Thin   Room service appropriate? Yes   Fluid restriction: 1500 mL Fluid      09/17/19 1558            Code Status: Full Code    Subjective 09/20/19    Patient complains of a lot of nausea today.  He has vomited twice.  Denies any abdominal pain.  No shortness of breath.  Disposition Plan & Communication   Status is: Inpatient  Remains inpatient appropriate because:IV treatments appropriate due to intensity of illness or inability to take PO and Inpatient level of care appropriate due to severity of illness.  Remains on IV diuretics.   Dispo: The patient is from: Home              Anticipated d/c is to: SNF              Anticipated d/c date is: 3 days              Patient currently is not medically stable to d/c.    Family Communication: Discussed in detail with patient.   Consults, Procedures, Significant Events   Consultants:   Nephrology  Cardiology   Antimicrobials:   Zyvox will be stopped on 6/23   Objective   Vitals:   09/19/19 1355 09/19/19 2101 09/20/19 0500 09/20/19 0524  BP: 136/83 139/71  (!) 175/77  Pulse: 87 79  86  Resp: 18 18  19   Temp: 98.2 F (36.8 C) 98.1 F (36.7 C)  98.6 F (37 C)  TempSrc:      SpO2: 97% 97%  97%  Weight:   (!) 160.4 kg   Height:        Intake/Output Summary (Last 24 hours) at 09/20/2019 1128 Last data filed at 09/20/2019 1002 Gross per 24 hour  Intake 849 ml  Output 3450 ml  Net -2601 ml   Filed Weights   09/18/19 0532 09/19/19 0500 09/20/19 0500  Weight: (!) 161 kg (!) 160.8 kg (!) 160.4 kg    Physical Exam:  General  appearance: Awake alert.  In no distress Resp: Clear to auscultation bilaterally.  Normal effort Cardio: S1-S2 is normal regular.  No S3-S4.  No rubs murmurs or bruit GI: Abdomen is soft.  Nontender nondistended.  Bowel sounds are present normal.  No masses organomegaly Extremities: Lower extremities covered by wraps.  No erythema noted.. Neurologic: Alert and oriented x3.  No focal neurological deficits.     Labs   Data Reviewed: I have personally reviewed following labs and imaging studies  CBC: Recent Labs  Lab 09/16/19 0412 09/17/19 0557 09/18/19 0356 09/19/19 0336 09/20/19 0404  WBC 11.8* 14.8* 13.2* 13.1* 11.0*  HGB 8.9* 8.8* 8.7* 8.7* 8.7*  HCT 29.7* 29.0* 29.2* 28.6* 28.8*  MCV 90.0 90.1 90.1 89.4 88.3  PLT 452* 420* 350 370 768   Basic Metabolic Panel: Recent Labs  Lab 09/14/19 0356 09/14/19 0356 09/15/19 0403  09/15/19 0403 09/16/19 3818 09/17/19 0557 09/18/19 0356 09/19/19 0336 09/20/19 0404  NA 137   < > 137   < > 134* 133* 134* 137 136  K 4.8   < > 4.7   < > 4.6 4.8 5.2* 5.3* 4.9  CL 103   < > 104   < > 100 102 98 99 98  CO2 24   < > 23   < > 27 25 23 25 27   GLUCOSE 112*   < > 140*   < > 133* 103* 101* 90 112*  BUN 67*   < > 61*   < > 55* 51* 56* 59* 66*  CREATININE 3.80*   < > 3.60*   < > 3.52* 3.37* 3.52* 3.51* 3.98*  CALCIUM 8.3*   < > 8.5*   < > 8.4* 8.4* 8.6* 8.9 9.1  MG 2.3  --  2.2  --  2.2 2.0  --   --   --    < > = values in this interval not displayed.   GFR: Estimated Creatinine Clearance: 32.9 mL/min (A) (by C-G formula based on SCr of 3.98 mg/dL (H)). CBG: Recent Labs  Lab 09/19/19 0758 09/19/19 1145 09/19/19 1654 09/19/19 2110 09/20/19 0757  GLUCAP 127* 86 74 81 82     Recent Results (from the past 240 hour(s))  Body fluid culture     Status: None   Collection Time: 09/11/19  1:47 PM   Specimen: Synovium; Body Fluid  Result Value Ref Range Status   Specimen Description   Final    SYNOVIAL KNEE LEFT Performed at [facility] [address], [place], [place] [address]    Special Requests NONE  Final   Gram Stain   Final    ABUNDANT WBC PRESENT, PREDOMINANTLY PMN NO ORGANISMS SEEN    Culture   Final    NO GROWTH Performed at [facility], [place] [address], [place], [place] [address]    Report Status 09/14/2019 FINAL  Final  SARS CORONAVIRUS 2 (TAT 6-24 HRS) Nasopharyngeal Nasopharyngeal Swab     Status: None   Collection Time: 09/14/19  5:52 PM   Specimen: Nasopharyngeal Swab  Result Value Ref Range Status   SARS Coronavirus 2 NEGATIVE NEGATIVE Final    Comment: (NOTE) SARS-CoV-2 target nucleic acids are NOT DETECTED.  The SARS-CoV-2 RNA is generally detectable in upper and lower respiratory specimens during the acute phase of infection. Negative results do not preclude SARS-CoV-2 infection, do not rule out co-infections with other pathogens, and should not be used as the sole basis for treatment or other patient management decisions. Negative results must be combined with clinical observations, patient history, and epidemiological information. The expected result is Negative.  Fact Sheet for Patients: SugarRoll.be  Fact Sheet for Healthcare Providers: https://www.woods-mathews.com/  This test is not yet approved or cleared by the [place] FDA and  has been authorized for detection and/or diagnosis of SARS-CoV-2 by FDA under an Emergency Use Authorization (EUA). This EUA will remain  in effect (meaning this test can be used) for the duration of the COVID-19 declaration under Se ction 564(b)(1) of the Act, 21 U.S.C. section 360bbb-3(b)(1), unless the authorization is terminated or revoked sooner.  Performed at [facility], [place] [address] [place], [place] [address]       Imaging Studies   ECHOCARDIOGRAM COMPLETE  Result Date: 09/18/2019    ECHOCARDIOGRAM REPORT   Patient Name:   DASH CARDARELLI  Nov[place]ad Date of  Exam: 09/18/2019 Medical Rec #:  [id number]           Height:       74.0 in Accession #:    [id number]          Weight:       354.9 lb Date of Birth:  1960/12/19           BSA:          2.773 m Patient Age:    22 years            BP:           146/74 mmHg Patient Gender: M                   HR:           84 bpm. Exam Location:  Inpatient Procedure: 2D Echo, Cardiac Doppler and Color Doppler Indications:    Volume Overload. [id number]  History:        Patient has prior history of Echocardiogram examinations, most                 recent 02/28/2019.  Sonographer:    Tiffany Dance Referring Phys: [id number] Derma  1. Normal LV function; moderate LVE; mild LAE.  2. Left ventricular ejection fraction, by estimation, is 60 to 65%. The left ventricle has normal function. The left ventricle has no regional wall motion abnormalities. The left ventricular internal cavity size was moderately dilated. Left ventricular diastolic parameters were normal.  3. Right ventricular systolic function is normal. The right ventricular size is normal.  4. Left atrial size was mildly dilated.  5. The mitral valve is normal in structure. No evidence of mitral valve regurgitation. No evidence of mitral stenosis.  6. The aortic valve is normal in structure. Aortic valve regurgitation is not visualized. No aortic stenosis is present.  7. The inferior vena cava is normal in size with greater than 50% respiratory variability, suggesting right atrial pressure of 3 mmHg. FINDINGS  Left Ventricle: Left ventricular ejection fraction, by estimation, is 60 to 65%. The left ventricle has normal function. The left ventricle has no regional wall motion abnormalities. The left ventricular internal cavity size was moderately dilated. There is no left ventricular hypertrophy. Left ventricular diastolic parameters were normal. Right Ventricle: The right ventricular size is normal.Right ventricular systolic function is normal. Left Atrium: Left  atrial size was mildly dilated. Right Atrium: Right atrial size was normal in size. Pericardium: Trivial pericardial effusion is present. Mitral Valve: The mitral valve is normal in structure. Normal mobility of the mitral valve leaflets. No evidence of mitral valve regurgitation. No evidence of mitral valve stenosis. Tricuspid Valve: The tricuspid valve is normal in structure. Tricuspid valve regurgitation is trivial. No evidence of tricuspid stenosis. Aortic Valve: The aortic valve is normal in structure. Aortic valve regurgitation is not visualized. No aortic stenosis is present. Pulmonic Valve: The pulmonic valve was normal in structure. Pulmonic valve regurgitation is not visualized. No evidence of pulmonic stenosis. Aorta: The aortic root is normal in size and structure. Venous: The inferior vena cava is normal in size with greater than 50% respiratory variability, suggesting right atrial pressure of 3 mmHg. IAS/Shunts: No atrial level shunt detected by color flow Doppler. Additional Comments: Normal LV function; moderate LVE; mild LAE.  LEFT VENTRICLE PLAX 2D LVIDd:         6.00 cm  Diastology LVIDs:  4.30 cm  LV e' lateral:   11.50 cm/s LV PW:         1.10 cm  LV E/e' lateral: 8.6 LV IVS:        1.20 cm  LV e' medial:    9.46 cm/s LVOT diam:     2.20 cm  LV E/e' medial:  10.5 LV SV:         98 LV SV Index:   35 LVOT Area:     3.80 cm  RIGHT VENTRICLE             IVC RV Basal diam:  3.60 cm     IVC diam: 2.00 cm RV Mid diam:    3.10 cm RV S prime:     19.10 cm/s TAPSE (M-mode): 2.3 cm LEFT ATRIUM              Index       RIGHT ATRIUM           Index LA diam:        5.20 cm  1.87 cm/m  RA Area:     24.40 cm LA Vol (A2C):   135.0 ml 48.68 ml/m RA Volume:   70.20 ml  25.31 ml/m LA Vol (A4C):   107.0 ml 38.58 ml/m LA Biplane Vol: 123.0 ml 44.35 ml/m  AORTIC VALVE LVOT Vmax:   116.00 cm/s LVOT Vmean:  76.200 cm/s LVOT VTI:    0.259 m  AORTA Ao Root diam: 4.00 cm Ao Asc diam:  3.40 cm MITRAL VALVE MV  Area (PHT): 2.11 cm    SHUNTS MV Decel Time: 359 msec    Systemic VTI:  0.26 m MV E velocity: 98.90 cm/s  Systemic Diam: 2.20 cm MV A velocity: 87.00 cm/s MV E/A ratio:  1.14 Kirk Ruths MD Electronically signed by Kirk Ruths MD Signature Date/Time: 09/18/2019/3:36:52 PM    Final      Medications   Scheduled Meds: . amiodarone  200 mg Oral BID  . amLODipine  10 mg Oral Daily  . apixaban  5 mg Oral BID  . atorvastatin  20 mg Oral Daily  . furosemide  60 mg Intravenous Q8H  . gabapentin  300 mg Oral BID  . hydrALAZINE  50 mg Oral Q8H  . insulin aspart  0-15 Units Subcutaneous TID WC  . insulin aspart  0-5 Units Subcutaneous QHS  . insulin glargine  40 Units Subcutaneous BID  . polyethylene glycol  17 g Oral Daily  . senna-docusate  1 tablet Oral BID   Continuous Infusions:     LOS: 13 days      Bonnielee Haff, [facility]  09/20/2019, 11:28 AM    If 7PM-7AM, please contact night-coverage. How to contact the [facility] Attending or Consulting provider [place] or covering provider during after hours [place], for this patient?    1. Check the care team in [facility] and look for a) attending/consulting [facility] provider listed and b) the [facility] team listed 2. Log into [email or web address] and use [place]'s universal password to access. If you do not have the password, please contact the hospital operator. 3. Locate the [facility] provider you are looking for under [facility] and page to a number that you can be directly reached. 4. If you still have difficulty reaching the provider, please page the [facility] (Director on Call) for the Hospitalists listed on amion for assistance.

## 2019-09-20 NOTE — Progress Notes (Signed)
[facility] Progress Note  Subjective:  2700 of UOP - weight down from yest but variable - BUN and crt up some-  Still some nausea but maybe better-  Constipated-  BP has been good- tells me they stopped the zyvox- was nauseated early this AM but now is better    Vitals:   09/19/19 1355 09/19/19 2101 09/20/19 0500 09/20/19 0524  BP: 136/83 139/71  (!) 175/77  Pulse: 87 79  86  Resp: 18 18  19   Temp: 98.2 F (36.8 C) 98.1 F (36.7 C)  98.6 F (37 C)  TempSrc:      SpO2: 97% 97%  97%  Weight:   (!) 160.4 kg   Height:        Exam: Gen alert, no distress No jvd or bruits Chest clear bilat to bases  RRR no MRG Abd soft ntnd no mass or ascites +bs GU normal male Ext diffuse 2 LE and smaller UE edema Neuro is alert, Ox 3 , nf    Date              Creat               eGFR  2012- 18         0.69- 0.90            2019- 2020     0.96- 1.13 May 2019         0.98  May 2021       1.10-  2.31       40- >60 ml/min  September 04, 2019  1.63                 46 Since then worsening - has been 3.3 to 3.8 since 6/12-  Not a clear trend              UA Oct 2018 - negative   UA 08/08/19 - cloudy, 100 prot, few bact, 0-5 rbc, 11-20 wbc   UA 09/10/19 - hazy, large Hb, 30 prot, >50 rbc, 11-20 wbc    Renal US 6/12 - 11.3/ 12.0 cm kidneys, limited exam d/t body habitus, no hydro, relative preservation of cortical thickness   ECHO 02/2019 - LVEF 55-60%, +LVH, mild reduced RV fxn, no valve issues    I/O since admit are 11.2L in and 15.8 L out  = - 4.5 L net    Wt's are down 168kg > 163kd today = - 5kg   BP's have been high since admission   UP/C ratio = 0.8   Assessment/ Plan: 1. AoCKD 3 - baseline creat 1.6, eGFR 46 ml/min. Baseline CKD likely due to DM/ HTN.  Here creat 2.5 >> 3.8, in setting of marked vol overload, hx of RV dysfunction by echo, normal LVEF. UP/C ratio 0.8. Possible cardiorenal element.  UA +wbc/ rbc, repeat shows same.  Recovery is stalled for some reason- crt  3.2 to 3.9 without true trend for several days-  Is it because BP is better controlled ?  Possibly some relative hypotension for him ? With hematuria may need to check some serologies for completeness sake.  No dialysis needs- cont to watch 2. Vol overload - as above- Still has a significant edema but much better than admission. Down in weight. Needs to restrict fluid intake here. CXR negative yest. Cont lasix  for now but decrease to BID 3. HTN - BP's have been high since arrival high, ^'d norvasc 5 bid, added  hydralazine - now at 50 tid. Avoid acei /ARB for now. Cont diuresing - reasonable for now - no change-  Do not want to get BP too low  4. DM2 on insulin 5. Atrial fibrillation - on amio, eliquis 6. LLE cellulitis - sp vanc, on linezolid  7. Morbid obesity 8. R HF - by last echo 02/2019 9. Hyperkalemia-  Got dose of lokelma today  10. Nausea-  Thinking it is the zyvox vs constipation-  zyvox stopped working on constipation    Louis Meckel  09/20/2019, 11:41 AM   Recent Labs  Lab 09/19/19 0336 09/20/19 0404  K 5.3* 4.9  BUN 59* 66*  CREATININE 3.51* 3.98*  CALCIUM 8.9 9.1  HGB 8.7* 8.7*   Inpatient medications: . amiodarone  200 mg Oral BID  . amLODipine  10 mg Oral Daily  . apixaban  5 mg Oral BID  . atorvastatin  20 mg Oral Daily  . furosemide  60 mg Intravenous Q8H  . gabapentin  300 mg Oral BID  . hydrALAZINE  50 mg Oral Q8H  . insulin aspart  0-15 Units Subcutaneous TID WC  . insulin aspart  0-5 Units Subcutaneous QHS  . insulin glargine  40 Units Subcutaneous BID  . polyethylene glycol  17 g Oral Daily  . senna-docusate  1 tablet Oral BID    acetaminophen **OR** acetaminophen, bisacodyl, labetalol, morphine injection, ondansetron **OR** ondansetron (ZOFRAN) IV, oxyCODONE-acetaminophen **AND** oxyCODONE

## 2019-09-20 NOTE — Progress Notes (Signed)
PHYSICAL THERAPY  Pt declined any OOB activity due to fear of more vomiting.  "They thinks it is the Zyvox that is making me sick".  "Hopefully we can do something tomorrow cause they took me off that".  Pt has been evaluated with rec for SNF.  Awaiting medical.  Will continue to monitor during his acute stay.  Rica Koyanagi  PTA Acute  Rehabilitation Services Pager      [phone] Office      [phone]

## 2019-09-21 LAB — BASIC METABOLIC PANEL
Anion gap: 13 (ref 5–15)
BUN: 64 mg/dL — ABNORMAL HIGH (ref 6–20)
CO2: 26 mmol/L (ref 22–32)
Calcium: 8.9 mg/dL (ref 8.9–10.3)
Chloride: 98 mmol/L (ref 98–111)
Creatinine, Ser: 4.14 mg/dL — ABNORMAL HIGH (ref 0.61–1.24)
GFR calc Af Amer: 17 mL/min — ABNORMAL LOW (ref >60)
GFR calc non Af Amer: 15 mL/min — ABNORMAL LOW (ref >60)
Glucose, Bld: 82 mg/dL (ref 70–99)
Potassium: 5.3 mmol/L — ABNORMAL HIGH (ref 3.5–5.1)
Sodium: 137 mmol/L (ref 135–145)

## 2019-09-21 LAB — CBC
HCT: 29.7 % — ABNORMAL LOW (ref 39.0–52.0)
Hemoglobin: 8.9 g/dL — ABNORMAL LOW (ref 13.0–17.0)
MCH: 26.9 pg (ref 26.0–34.0)
MCHC: 30 g/dL (ref 30.0–36.0)
MCV: 89.7 fL (ref 80.0–100.0)
Platelets: 259 10*3/uL (ref 150–400)
RBC: 3.31 MIL/uL — ABNORMAL LOW (ref 4.22–5.81)
RDW: 16.4 % — ABNORMAL HIGH (ref 11.5–15.5)
WBC: 13.2 10*3/uL — ABNORMAL HIGH (ref 4.0–10.5)
nRBC: 0 % (ref 0.0–0.2)

## 2019-09-21 LAB — GLUCOSE, CAPILLARY
Glucose-Capillary: 145 mg/dL — ABNORMAL HIGH (ref 70–99)
Glucose-Capillary: 138 mg/dL — ABNORMAL HIGH (ref 70–99)
Glucose-Capillary: 133 mg/dL — ABNORMAL HIGH (ref 70–99)
Glucose-Capillary: 127 mg/dL — ABNORMAL HIGH (ref 70–99)

## 2019-09-21 MED ORDER — SENNOSIDES-DOCUSATE SODIUM 8.6-50 MG PO TABS
2.0000 | ORAL_TABLET | Freq: Two times a day (BID) | ORAL | Status: DC
  Administered 2019-09-21 – 2019-09-28 (×13): 2 via ORAL
  Filled 2019-09-21 (×13): qty 2

## 2019-09-21 MED ORDER — POLYETHYLENE GLYCOL 3350 17 G PO PACK
17.0000 g | PACK | Freq: Two times a day (BID) | ORAL | Status: DC
  Administered 2019-09-21 – 2019-09-28 (×13): 17 g via ORAL
  Filled 2019-09-21 (×13): qty 1

## 2019-09-21 MED ORDER — SODIUM ZIRCONIUM CYCLOSILICATE 10 G PO PACK
10.0000 g | PACK | Freq: Once | ORAL | Status: AC
  Administered 2019-09-21: 10 g via ORAL
  Filled 2019-09-21: qty 1

## 2019-09-21 MED ORDER — FLEET ENEMA 7-19 GM/118ML RE ENEM
1.0000 | ENEMA | Freq: Once | RECTAL | Status: AC
  Administered 2019-09-21: 1 via RECTAL
  Filled 2019-09-21: qty 1

## 2019-09-21 NOTE — Progress Notes (Signed)
[facility] Progress Note  Subjective:  2100 of UOP on lasix - weight variable - BUN and crt up a little-   Nausea better-  Constipated but working on that-  BP has been fine-   Vitals:   09/20/19 0524 09/20/19 1500 09/20/19 2146 09/21/19 0443  BP: (!) 175/77 (!) 168/77 (!) 144/64 (!) 152/94  Pulse: 86 85 82 83  Resp: _0 Temp: 98.6 F (37 C) 97.7 F (36.5 C) 98.1 F (36.7 C) 98.1 F (36.7 C)  TempSrc:  Oral Oral Oral  SpO2: 97% 97% 96% 97%  Weight:      Height:        Exam: Gen alert, no distress No jvd or bruits Chest clear bilat to bases  RRR no MRG Abd soft ntnd no mass or ascites +bs GU normal male Ext  edema but better Neuro is alert, Ox 3 , nf    Date              Creat               eGFR  2012- 18         0.69- 0.90            2019- 2020     0.96- 1.13 May 2019         0.98  May 2021       1.10-  2.31       40- >60 ml/min  September 04, 2019  1.63                 46 Since then worsening - has been 3.3 to 3.8 since 6/12-  Not a clear trend              UA Oct 2018 - negative   UA 08/08/19 - cloudy, 100 prot, few bact, 0-5 rbc, 11-20 wbc   UA 09/10/19 - hazy, large Hb, 30 prot, >50 rbc, 11-20 wbc    Renal US 6/12 - 11.3/ 12.0 cm kidneys, limited exam d/t body habitus, no hydro, relative preservation of cortical thickness   ECHO 02/2019 - LVEF 55-60%, +LVH, mild reduced RV fxn, no valve issues    I/O since admit are 11.2L in and 15.8 L out  = - 4.5 L net    Wt's are down 168kg > 163kd today = - 5kg   BP's have been high since admission   UP/C ratio = 0.8   Assessment/ Plan: 1. AoCKD 3 - baseline creat 1.6, eGFR 46 ml/min. Baseline CKD likely due to DM/ HTN.  Here creat 2.5 >> 3.8, in setting of marked vol overload, hx of RV dysfunction by echo, normal LVEF. UP/C ratio 0.8. Possible cardiorenal element.  UA +wbc/ rbc, repeat shows same.  Recovery is stalled for some reason- crt 3.2 to 3.9 without true trend for several days but now  trending worse-  Is it because BP is better controlled ?  Possibly some relative hypotension for him ? With hematuria may need to check some serologies for completeness sake- results pending.  No dialysis needs, non oliguric- cont to watch 2. Vol overload - as above- Still has a significant edema but much better than admission. Down in weight. Needs to restrict fluid intake here. CXR negative yest. Cont lasix  for now - recently decreased  3. HTN - BP's have been high since arrival high, ^'d norvasc 5 bid, added hydralazine - now at 50 tid. Avoid  acei /ARB for now. Cont diuresing - reasonable for now - no change-  Do not want to get BP too low  4. DM2 on insulin 5. Atrial fibrillation - on amio, eliquis 6. LLE cellulitis - sp vanc, on linezolid  7. Morbid obesity 8. R HF - by last echo 02/2019 9. Hyperkalemia-  Got dose of lokelma today again  10. Nausea-  Thinking it is the zyvox vs constipation-  zyvox stopped working on constipation- is better     [employer]  09/21/2019, 11:17 AM   Recent Labs  Lab 09/20/19 0404 09/21/19 0348  K 4.9 5.3*  BUN 66* 64*  CREATININE 3.98* 4.14*  CALCIUM 9.1 8.9  HGB 8.7* 8.9*   Inpatient medications: . amiodarone  200 mg Oral BID  . amLODipine  10 mg Oral Daily  . apixaban  5 mg Oral BID  . atorvastatin  20 mg Oral Daily  . furosemide  60 mg Intravenous Q12H  . gabapentin  300 mg Oral BID  . hydrALAZINE  50 mg Oral Q8H  . insulin aspart  0-15 Units Subcutaneous TID WC  . insulin aspart  0-5 Units Subcutaneous QHS  . insulin glargine  40 Units Subcutaneous BID  . polyethylene glycol  17 g Oral BID  . senna-docusate  2 tablet Oral BID    acetaminophen **OR** acetaminophen, bisacodyl, labetalol, morphine injection, ondansetron **OR** ondansetron (ZOFRAN) IV, oxyCODONE-acetaminophen **AND** oxyCODONE

## 2019-09-21 NOTE — Progress Notes (Signed)
PROGRESS NOTE    Jeffery [name]   SRP:[id number]  DOB: 10-Oct-1960  PCP: [name], MD    DOA: 09/07/2019 LOS: 14   Brief Narrative   59 y.o.malewith medical history significant ofObesity, DM2, HTN, P Afib, EtOH use, Charcot's foot, Chronic Pain syndrome, Hx of Cardioversion, diastolic CHF, chronic LE blister and edema s/p excision by podiatry comes with worsening of left lower ext foot pain.  Admitted for sepsis secondary to purulent left lower extremity cellulitis.  Hospital course complicated by AKI.  MRI left leg suggested extensive cellulitis with moderate left knee effusion.  Orthopedic consulted who aspirated left knee fluid suggestive of inflammatory fluid.  Getting Zyvox for his cellulitis.  Nephrology following.  Getting diuretics.   Assessment & Plan    Sepsis secondary to left Lower extremity Cellulitis with some purulent; POA B/L lower extremity chronic skin changes with blister Sepsis physiology improved. MRI-left lower extremity cellulitis but no evidence of abscess/osteomyelitis Ultrasound ABI-difficult to obtain due to blisters Blood cultures negative. Patient seen by podiatry, Dr. Jacqualyn Posey.  Outpatient follow-up with him after discharge. He was initially on vancomycin which was changed over to Zyvox due to his renal issues.  Patient completed about 12 days of treatment.  Zyvox had to be discontinued due to significant nausea.  Infection seems to be under control.  WBC noted to be higher today.  He is afebrile.  Continue to monitor for now.  Nausea and vomiting Thought to be either due to Zyvox or constipation.  Zyvox was discontinued yesterday.  He has not had a bowel movement in many days.  He has been on a bowel regimen.  We will give him a fleets enema today.    Acute Kidney Injury on CKD Stage IIIa/mild hyperkalemia -Baseline creatinine 1.6. Doing.  He was placed on IV furosemide.  He has had good urine output.  However his creatinine continues to  rise.  He was given [facility] with improvement in potassium.  Give additional dose today.  Renal ultrasound was unremarkable.  Echocardiogram showed normal systolic function.  No concern for cardiorenal syndrome at this time.  Further management per nephrology.    Volume Overload  Secondary to worsening renal function vs CHF vs R heart failure / pulmonary hypertension.  Remains on IV diuretics.  Echocardiogram showed normal systolic function.  No evidence for pulmonary hypertension noted.  Patient was also seen by cardiology.  See above.  Left shoulder and left ankle pain, improved Acute mild gout flare Moderate left knee effusion-inflammatory versus infectious X-ray showed chronic changes.   MRI showed soft tissue inflammation concerning for cellulitis with moderate left knee effusion. -Seen by Ortho, s/p aspiration 6/14 suggestive of inflammatory fluid.   Received colchicine doing better. -Pain control -Continue work with PT/OT-SNF.    Patient apparently reluctant.  TOC team to continue to work on this.    Constipation Due to pain medications and lack of mobility.  Continue laxatives and stool softeners.  No bowel movement in many days.  We will give him an enema today.  TSH was 7.37.  Free T4 1.0.  Sinus bradycardia Continue amiodarone.  Metoprolol was discontinued.  Essential Hypertension Remains on amlodipine.  Hydralazine as needed.  Blood pressure is reasonably well controlled.  Also on diuretics.  Metoprolol had to be held due to bradycardia.  Heart rate has been better.Marland Kitchen  DM2 with neuropathy, controlled HbA1c 8.0.  CBGs are reasonably well controlled.  Continue Lantus and SSI.  Also on gabapentin for neuropathy.  ParoxysmalA Fib, s/p Cardioversion -On  amiodarone and Eliquis.  LFTs are normal.  TSH noted to be 7.37 with a normal free T4.  Recommend rechecking thyroid function tests in a few weeks.  Anemia likely due to chronic kidney disease Hemoglobin is low but  stable.  No evidence of overt bleeding.    Morbid Obesity: Body mass index is 45.38 kg/m.  This complicates overall care and prognosis.  PT-recommending SNF  DVT prophylaxis:  apixaban (ELIQUIS) tablet 5 mg   Diet:  Diet Orders (From admission, onward)    Start     Ordered   09/17/19 1559  Diet Carb Modified Fluid consistency: Thin; Room service appropriate? Yes; Fluid restriction: 1500 mL Fluid  Diet effective now       Question Answer Comment  Diet-HS Snack? Nothing   Calorie Level Medium 1600-2000   Fluid consistency: Thin   Room service appropriate? Yes   Fluid restriction: 1500 mL Fluid      09/17/19 1558            Code Status: Full Code    Subjective 09/21/19    Patient states that nausea is slightly better.  Has not had any vomiting since early yesterday morning.  Denies any abdominal pain.  Still no bowel movement.  Disposition Plan & Communication   Status is: Inpatient  Remains inpatient appropriate because:IV treatments appropriate due to intensity of illness or inability to take PO   Dispo:  Patient From: Home  Planned Disposition: To be determined  Expected discharge date: 09/25/19  Medically stable for discharge: No   Family Communication: Discussed in detail with patient.   Consults, Procedures, Significant Events   Consultants:   Nephrology  Cardiology   Antimicrobials:   Zyvox stopped on 6/23   Objective   Vitals:   09/20/19 0524 09/20/19 1500 09/20/19 2146 09/21/19 0443  BP: (!) 175/77 (!) 168/77 (!) 144/64 (!) 152/94  Pulse: 86 85 82 83  Resp: 19 20 16 18   Temp: 98.6 F (37 C) 97.7 F (36.5 C) 98.1 F (36.7 C) 98.1 F (36.7 C)  TempSrc:  Oral Oral Oral  SpO2: 97% 97% 96% 97%  Weight:      Height:        Intake/Output Summary (Last 24 hours) at 09/21/2019 1004 Last data filed at 09/21/2019 0827 Gross per 24 hour  Intake 1112 ml  Output 1700 ml  Net -588 ml   Filed Weights   09/18/19 0532 09/19/19 0500 09/20/19  0500  Weight: (!) 161 kg (!) 160.8 kg (!) 160.4 kg    Physical Exam:  General appearance: Awake alert.  In no distress Resp: Clear to auscultation bilaterally.  Normal effort Cardio: S1-S2 is normal regular.  No S3-S4.  No rubs murmurs or bruit GI: Abdomen is soft.  Nontender nondistended.  Bowel sounds are present normal.  No masses organomegaly Extremities: Legs covered in wraps.  No erythema noted. Neurologic: Alert and oriented x3.  No focal neurological deficits.      Labs   Data Reviewed: I have personally reviewed following labs and imaging studies  CBC: Recent Labs  Lab 09/17/19 0557 09/18/19 0356 09/19/19 0336 09/20/19 0404 09/21/19 0348  WBC 14.8* 13.2* 13.1* 11.0* 13.2*  HGB 8.8* 8.7* 8.7* 8.7* 8.9*  HCT 29.0* 29.2* 28.6* 28.8* 29.7*  MCV 90.1 90.1 89.4 88.3 89.7  PLT 420* 350 370 321 580   Basic Metabolic Panel: Recent Labs  Lab 09/15/19 0403 09/15/19 0403 09/16/19 0412 09/16/19 0412 09/17/19  3545 09/18/19 0356 09/19/19 0336 09/20/19 0404 09/21/19 0348  NA 137   < > 134*   < > 133* 134* 137 136 137  K 4.7   < > 4.6   < > 4.8 5.2* 5.3* 4.9 5.3*  CL 104   < > 100   < > 102 98 99 98 98  CO2 23   < > 27   < > 25 23 25 27 26   GLUCOSE 140*   < > 133*   < > 103* 101* 90 112* 82  BUN 61*   < > 55*   < > 51* 56* 59* 66* 64*  CREATININE 3.60*   < > 3.52*   < > 3.37* 3.52* 3.51* 3.98* 4.14*  CALCIUM 8.5*   < > 8.4*   < > 8.4* 8.6* 8.9 9.1 8.9  MG 2.2  --  2.2  --  2.0  --   --   --   --    < > = values in this interval not displayed.   GFR: Estimated Creatinine Clearance: 31.6 mL/min (A) (by C-G formula based on SCr of 4.14 mg/dL (H)). CBG: Recent Labs  Lab 09/20/19 0757 09/20/19 1157 09/20/19 1643 09/20/19 2143 09/21/19 0725  GLUCAP 82 105* 102* 136* 145*     Recent Results (from the past 240 hour(s))  Body fluid culture     Status: None   Collection Time: 09/11/19  1:47 PM   Specimen: Synovium; Body Fluid  Result Value Ref Range Status    Specimen Description   Final    SYNOVIAL KNEE LEFT Performed at [facility] [address], [place], [place] [address]    Special Requests NONE  Final   Gram Stain   Final    ABUNDANT WBC PRESENT, PREDOMINANTLY PMN NO ORGANISMS SEEN    Culture   Final    NO GROWTH Performed at [facility], [place] [address], [place], [place] [address]    Report Status 09/14/2019 FINAL  Final  SARS CORONAVIRUS 2 (TAT 6-24 HRS) Nasopharyngeal Nasopharyngeal Swab     Status: None   Collection Time: 09/14/19  5:52 PM   Specimen: Nasopharyngeal Swab  Result Value Ref Range Status   SARS Coronavirus 2 NEGATIVE NEGATIVE Final    Comment: (NOTE) SARS-CoV-2 target nucleic acids are NOT DETECTED.  The SARS-CoV-2 RNA is generally detectable in upper and lower respiratory specimens during the acute phase of infection. Negative results do not preclude SARS-CoV-2 infection, do not rule out co-infections with other pathogens, and should not be used as the sole basis for treatment or other patient management decisions. Negative results must be combined with clinical observations, patient history, and epidemiological information. The expected result is Negative.  Fact Sheet for Patients: SugarRoll.be  Fact Sheet for Healthcare Providers: https://www.woods-mathews.com/  This test is not yet approved or cleared by the [place] FDA and  has been authorized for detection and/or diagnosis of SARS-CoV-2 by FDA under an Emergency Use Authorization (EUA). This EUA will remain  in effect (meaning this test can be used) for the duration of the COVID-19 declaration under Se ction 564(b)(1) of the Act, 21 U.S.C. section 360bbb-3(b)(1), unless the authorization is terminated or revoked sooner.  Performed at [facility], [place] [address] [place], [place] [address]       Imaging Studies   No results found.   Medications   Scheduled  Meds: . amiodarone  200 mg Oral BID  .  amLODipine  10 mg Oral Daily  . apixaban  5 mg Oral BID  . atorvastatin  20 mg Oral Daily  . furosemide  60 mg Intravenous Q12H  . gabapentin  300 mg Oral BID  . hydrALAZINE  50 mg Oral Q8H  . insulin aspart  0-15 Units Subcutaneous TID WC  . insulin aspart  0-5 Units Subcutaneous QHS  . insulin glargine  40 Units Subcutaneous BID  . polyethylene glycol  17 g Oral Daily  . senna-docusate  1 tablet Oral BID   Continuous Infusions:     LOS: 14 days      Bonnielee Haff, [facility]  09/21/2019, 10:04 AM    If 7PM-7AM, please contact night-coverage. How to contact the [facility] Attending or Consulting provider [place] or covering provider during after hours [place], for this patient?    1. Check the care team in [facility] and look for a) attending/consulting TRH provider listed and b) the [facility] team listed 2. Log into [email or web address] and use [place]'s universal password to access. If you do not have the password, please contact the hospital operator. 3. Locate the [facility] provider you are looking for under [facility] and page to a number that you can be directly reached. 4. If you still have difficulty reaching the provider, please page the [facility] (Director on Call) for the Hospitalists listed on amion for assistance.

## 2019-09-21 NOTE — Progress Notes (Cosign Needed)
Dr. Oval Linsey requested f/u arranged for this patient in a few weeks - appt placed on chart for APP on 10/12/19. Malissia Rabbani PA-C

## 2019-09-21 NOTE — Progress Notes (Signed)
PHYSICAL THERAPY  Pt was OOB in bathroom for extended time after receiving an enema.  Pt declined amb.  Pt has been evaluated with rec for SNF awaiting medical for D/C.  Rica Koyanagi  PTA Acute  Rehabilitation Services Pager      [phone] Office      [phone]

## 2019-09-22 LAB — GLUCOSE, CAPILLARY
Glucose-Capillary: 70 mg/dL (ref 70–99)
Glucose-Capillary: 121 mg/dL — ABNORMAL HIGH (ref 70–99)
Glucose-Capillary: 116 mg/dL — ABNORMAL HIGH (ref 70–99)
Glucose-Capillary: 110 mg/dL — ABNORMAL HIGH (ref 70–99)

## 2019-09-22 LAB — MPO/PR-3 (ANCA) ANTIBODIES
ANCA Proteinase 3: <3.5 U/mL (ref 0.0–3.5)
Myeloperoxidase Abs: <9 U/mL (ref 0.0–9.0)

## 2019-09-22 LAB — RENAL FUNCTION PANEL
Albumin: 2.5 g/dL — ABNORMAL LOW (ref 3.5–5.0)
Anion gap: 12 (ref 5–15)
BUN: 65 mg/dL — ABNORMAL HIGH (ref 6–20)
CO2: 25 mmol/L (ref 22–32)
Calcium: 9.2 mg/dL (ref 8.9–10.3)
Chloride: 99 mmol/L (ref 98–111)
Creatinine, Ser: 4 mg/dL — ABNORMAL HIGH (ref 0.61–1.24)
GFR calc Af Amer: 18 mL/min — ABNORMAL LOW (ref >60)
GFR calc non Af Amer: 16 mL/min — ABNORMAL LOW (ref >60)
Glucose, Bld: 83 mg/dL (ref 70–99)
Phosphorus: 5.6 mg/dL — ABNORMAL HIGH (ref 2.5–4.6)
Potassium: 4.8 mmol/L (ref 3.5–5.1)
Sodium: 136 mmol/L (ref 135–145)

## 2019-09-22 LAB — CBC
HCT: 28.8 % — ABNORMAL LOW (ref 39.0–52.0)
Hemoglobin: 8.7 g/dL — ABNORMAL LOW (ref 13.0–17.0)
MCH: 27 pg (ref 26.0–34.0)
MCHC: 30.2 g/dL (ref 30.0–36.0)
MCV: 89.4 fL (ref 80.0–100.0)
Platelets: 223 10*3/uL (ref 150–400)
RBC: 3.22 MIL/uL — ABNORMAL LOW (ref 4.22–5.81)
RDW: 16.4 % — ABNORMAL HIGH (ref 11.5–15.5)
WBC: 10.4 10*3/uL (ref 4.0–10.5)
nRBC: 0 % (ref 0.0–0.2)

## 2019-09-22 LAB — ANTINUCLEAR ANTIBODIES, IFA: ANA Ab, IFA: NEGATIVE

## 2019-09-22 LAB — GLOMERULAR BASEMENT MEMBRANE ANTIBODIES: GBM Ab: 3 units (ref 0–20)

## 2019-09-22 MED ORDER — DARBEPOETIN ALFA 100 MCG/0.5ML IJ SOSY
100.0000 ug | PREFILLED_SYRINGE | INTRAMUSCULAR | Status: DC
  Administered 2019-09-22: 100 ug via SUBCUTANEOUS
  Filled 2019-09-22: qty 0.5

## 2019-09-22 NOTE — Progress Notes (Signed)
PROGRESS NOTE    Jeffery [name]   DIY:[id number]  DOB: 07/25/1960  PCP: [name], MD    DOA: 09/07/2019 LOS: 15   Brief Narrative   59 y.o.malewith medical history significant ofObesity, DM2, HTN, P Afib, EtOH use, Charcot's foot, Chronic Pain syndrome, Hx of Cardioversion, diastolic CHF, chronic LE blister and edema s/p excision by podiatry comes with worsening of left lower ext foot pain.  Admitted for sepsis secondary to purulent left lower extremity cellulitis.  Hospital course complicated by AKI.  MRI left leg suggested extensive cellulitis with moderate left knee effusion.  Orthopedic consulted who aspirated left knee fluid suggestive of inflammatory fluid.  Getting Zyvox for his cellulitis.  Nephrology following.  Getting diuretics.   Assessment & Plan    Sepsis secondary to left Lower extremity Cellulitis with some purulent; POA B/L lower extremity chronic skin changes with blister Sepsis physiology improved. MRI-left lower extremity cellulitis but no evidence of abscess/osteomyelitis Ultrasound ABI-difficult to obtain due to blisters Blood cultures negative. Patient seen by podiatry, Dr. Jacqualyn Posey.  Outpatient follow-up with him after discharge. He was initially on vancomycin which was changed over to Zyvox due to his renal issues.  Patient completed about 12 days of treatment.  Zyvox had to be discontinued due to significant nausea.  Infection seems to be under control.  WBC is normal today.  Remains afebrile.  States that his legs feel better.  .  Nausea and vomiting Thought to be either due to Zyvox or constipation.  Zyvox was discontinued.  He was given an enema yesterday with which he had a small bowel movement.  Patient's nausea has resolved.    Acute Kidney Injury on CKD Stage IIIa/mild hyperkalemia Baseline creatinine 1.6.  Creatinine noted to climb up to 3.74. Nephrology was consulted.  Patient was placed on IV furosemide.  He has had good urine output.   Renal function noted to be stable.  Potassium is normal today.  Renal ultrasound was unremarkable.  Echocardiogram showed normal systolic function.   Further management per nephrology.     Volume Overload  Most likely secondary to progression of renal disease.  Patient remains on IV diuretics.  Echocardiogram showed normal systolic function.  No evidence for pulmonary hypertension noted.  Patient was also seen by cardiology.  See above.  Left shoulder and left ankle pain, improved Acute mild gout flare Moderate left knee effusion-inflammatory versus infectious X-ray showed chronic changes.   MRI showed soft tissue inflammation concerning for cellulitis with moderate left knee effusion. -Seen by Ortho, s/p aspiration 6/14 suggestive of inflammatory fluid.   Received colchicine doing better. -Pain control -Continue work with PT/OT-SNF.    Patient apparently reluctant.  TOC team to continue to work on this.   Outpatient follow-up with orthopedics, Dr. Erlinda Hong  Constipation Due to pain medications and lack of mobility.  He was given an enema yesterday with partial relief.  Dose of laxatives were increased as well.  Continue for now.  Another enema tomorrow if no BM today.  TSH was 7.37.  Free T4 1.0.  Sinus bradycardia Continue amiodarone.  Metoprolol was discontinued.  Essential Hypertension Remains on amlodipine.  Hydralazine as needed.  Blood pressure is reasonably well controlled.  Also on diuretics.  Metoprolol had to be held due to bradycardia.  Heart rate has been better.Marland Kitchen  DM2 with neuropathy, controlled HbA1c 8.0.  CBGs are reasonably well controlled.  Continue Lantus and SSI.  Also on gabapentin for neuropathy.    ParoxysmalA Fib, s/p  Cardioversion -On  amiodarone and Eliquis.  LFTs are normal.  TSH noted to be 7.37 with a normal free T4.  Recommend rechecking thyroid function tests in a few weeks.  Anemia likely due to chronic kidney disease Hemoglobin is low but stable.   No evidence of overt bleeding.    Morbid Obesity Estimated body mass index is 44.89 kg/m as calculated from the following:   Height as of this encounter: 6' 2.02" (1.88 m).   Weight as of this encounter: 158.7 kg.  PT-recommending SNF  DVT prophylaxis:  apixaban (ELIQUIS) tablet 5 mg   Diet:  Diet Orders (From admission, onward)    Start     Ordered   09/17/19 1559  Diet Carb Modified Fluid consistency: Thin; Room service appropriate? Yes; Fluid restriction: 1500 mL Fluid  Diet effective now       Question Answer Comment  Diet-HS Snack? Nothing   Calorie Level Medium 1600-2000   Fluid consistency: Thin   Room service appropriate? Yes   Fluid restriction: 1500 mL Fluid      09/17/19 1558            Code Status: Full Code    Subjective 09/22/19    Patient mentions that his nausea has resolved.  He continues to make urine.  Had a small bowel movement yesterday with the enema.  Passing a lot of gas from below.  Feels that his legs are getting better.    Disposition Plan & Communication   Status is: Inpatient  Remains inpatient appropriate because:IV treatments appropriate due to intensity of illness or inability to take PO   Dispo:  Patient From: Home  Planned Disposition: To be determined  Expected discharge date: 09/25/19  Medically stable for discharge: No   Family Communication: Discussed in detail with patient.   Consults, Procedures, Significant Events   Consultants:   Nephrology  Cardiology   Antimicrobials:   Zyvox stopped on 6/23   Objective   Vitals:   09/21/19 0443 09/21/19 1500 09/21/19 2206 09/22/19 0506  BP: (!) 152/94 (!) 155/77 (!) 157/86 (!) 153/71  Pulse: 83 78 82 82  Resp: 18  16 16   Temp: 98.1 F (36.7 C) 98.1 F (36.7 C) (!) 96.8 F (36 C) 97.8 F (36.6 C)  TempSrc: Oral Oral  Oral  SpO2: 97% 99% 97% 98%  Weight:    (!) 158.7 kg  Height:        Intake/Output Summary (Last 24 hours) at 09/22/2019 1104 Last data  filed at 09/22/2019 0851 Gross per 24 hour  Intake 1184 ml  Output 2175 ml  Net -991 ml   Filed Weights   09/19/19 0500 09/20/19 0500 09/22/19 0506  Weight: (!) 160.8 kg (!) 160.4 kg (!) 158.7 kg    Physical Exam:  General appearance: Awake alert.  In no distress Resp: Clear to auscultation bilaterally.  Normal effort Cardio: S1-S2 is normal regular.  No S3-S4.  No rubs murmurs or bruit GI: Abdomen is soft.  Nontender nondistended.  Bowel sounds are present normal.  No masses organomegaly Extremities: Both legs are covered by wraps. Edema seems to be improving. Neurologic: Alert and oriented x3.  No focal neurological deficits.         Labs   Data Reviewed: I have personally reviewed following labs and imaging studies  CBC: Recent Labs  Lab 09/18/19 0356 09/19/19 0336 09/20/19 0404 09/21/19 0348 09/22/19 0335  WBC 13.2* 13.1* 11.0* 13.2* 10.4  HGB 8.7* 8.7* 8.7* 8.9* 8.7*  HCT 29.2* 28.6* 28.8* 29.7* 28.8*  MCV 90.1 89.4 88.3 89.7 89.4  PLT 350 370 321 259 263   Basic Metabolic Panel: Recent Labs  Lab 09/16/19 0412 09/16/19 0412 09/17/19 0557 09/17/19 0557 09/18/19 0356 09/19/19 0336 09/20/19 0404 09/21/19 0348 09/22/19 0335  NA 134*   < > 133*   < > 134* 137 136 137 136  K 4.6   < > 4.8   < > 5.2* 5.3* 4.9 5.3* 4.8  CL 100   < > 102   < > 98 99 98 98 99  CO2 27   < > 25   < > 23 25 27 26 25   GLUCOSE 133*   < > 103*   < > 101* 90 112* 82 83  BUN 55*   < > 51*   < > 56* 59* 66* 64* 65*  CREATININE 3.52*   < > 3.37*   < > 3.52* 3.51* 3.98* 4.14* 4.00*  CALCIUM 8.4*   < > 8.4*   < > 8.6* 8.9 9.1 8.9 9.2  MG 2.2  --  2.0  --   --   --   --   --   --   PHOS  --   --   --   --   --   --   --   --  5.6*   < > = values in this interval not displayed.   GFR: Estimated Creatinine Clearance: 32.5 mL/min (A) (by C-G formula based on SCr of 4 mg/dL (H)). CBG: Recent Labs  Lab 09/21/19 0725 09/21/19 1222 09/21/19 1630 09/21/19 2201 09/22/19 0804  GLUCAP  145* 138* 133* 127* 70     Recent Results (from the past 240 hour(s))  SARS CORONAVIRUS 2 (TAT 6-24 HRS) Nasopharyngeal Nasopharyngeal Swab     Status: None   Collection Time: 09/14/19  5:52 PM   Specimen: Nasopharyngeal Swab  Result Value Ref Range Status   SARS Coronavirus 2 NEGATIVE NEGATIVE Final    Comment: (NOTE) SARS-CoV-2 target nucleic acids are NOT DETECTED.  The SARS-CoV-2 RNA is generally detectable in upper and lower respiratory specimens during the acute phase of infection. Negative results do not preclude SARS-CoV-2 infection, do not rule out co-infections with other pathogens, and should not be used as the sole basis for treatment or other patient management decisions. Negative results must be combined with clinical observations, patient history, and epidemiological information. The expected result is Negative.  Fact Sheet for Patients: SugarRoll.be  Fact Sheet for Healthcare Providers: https://www.woods-mathews.com/  This test is not yet approved or cleared by the [place] FDA and  has been authorized for detection and/or diagnosis of SARS-CoV-2 by FDA under an Emergency Use Authorization (EUA). This EUA will remain  in effect (meaning this test can be used) for the duration of the COVID-19 declaration under Se ction 564(b)(1) of the Act, 21 U.S.C. section 360bbb-3(b)(1), unless the authorization is terminated or revoked sooner.  Performed at [facility], [place] [address], [place], [place] [address]       Imaging Studies   No results found.   Medications   Scheduled Meds: . amiodarone  200 mg Oral BID  . amLODipine  10 mg Oral Daily  . apixaban  5 mg Oral BID  . atorvastatin  20 mg Oral Daily  . furosemide  60 mg Intravenous Q12H  . gabapentin  300 mg Oral BID  . hydrALAZINE  50 mg Oral Q8H  . insulin aspart  0-15 Units Subcutaneous TID WC  . insulin aspart  0-5 Units Subcutaneous QHS  .  insulin glargine  40 Units Subcutaneous BID  . polyethylene glycol  17 g Oral BID  . senna-docusate  2 tablet Oral BID   Continuous Infusions:     LOS: 15 days      Bonnielee Haff, [facility]  09/22/2019, 11:04 AM    If 7PM-7AM, please contact night-coverage. How to contact the [facility] Attending or Consulting provider [place] or covering provider during after hours [place], for this patient?    1. Check the care team in [facility] and look for a) attending/consulting TRH provider listed and b) the [facility] team listed 2. Log into [email or web address] and use [place]'s universal password to access. If you do not have the password, please contact the hospital operator. 3. Locate the [facility] provider you are looking for under [facility] and page to a number that you can be directly reached. 4. If you still have difficulty reaching the provider, please page the [facility] (Director on Call) for the Hospitalists listed on amion for assistance.

## 2019-09-22 NOTE — Progress Notes (Signed)
[facility] Progress Note  Subjective:  21250 of UOP on lasix - weight variable - BUN and crt stable to a little better-   Nausea better-  Constipated but working on that-  BP has been fine-   Vitals:   09/21/19 0443 09/21/19 1500 09/21/19 2206 09/22/19 0506  BP: (!) 152/94 (!) 155/77 (!) 157/86 (!) 153/71  Pulse: 83 78 82 82  Resp: 18  16 16   Temp: 98.1 F (36.7 C) 98.1 F (36.7 C) (!) 96.8 F (36 C) 97.8 F (36.6 C)  TempSrc: Oral Oral  Oral  SpO2: 97% 99% 97% 98%  Weight:    (!) 158.7 kg  Height:        Exam: Gen alert, no distress No jvd or bruits Chest clear bilat to bases  RRR no MRG Abd soft ntnd no mass or ascites +bs GU normal male Ext  edema but better Neuro is alert, Ox 3 , nf    Date              Creat               eGFR  2012- 18         0.69- 0.90            2019- 2020     0.96- 1.13 May 2019         0.98  May 2021       1.10-  2.31       40- >60 ml/min  September 04, 2019  1.63                 46 Since then worsening - has been 3.3 to 3.8 since 6/12-  Not a clear trend              UA Oct 2018 - negative   UA 08/08/19 - cloudy, 100 prot, few bact, 0-5 rbc, 11-20 wbc   UA 09/10/19 - hazy, large Hb, 30 prot, >50 rbc, 11-20 wbc    Renal US 6/12 - 11.3/ 12.0 cm kidneys, limited exam d/t body habitus, no hydro, relative preservation of cortical thickness   ECHO 02/2019 - LVEF 55-60%, +LVH, mild reduced RV fxn, no valve issues    I/O since admit are 11.2L in and 15.8 L out  = - 4.5 L net    Wt's are down 168kg > 163kd today = - 5kg   BP's have been high since admission   UP/C ratio = 0.8   Assessment/ Plan: 1. AoCKD 3 - baseline creat 1.6, eGFR 46 ml/min. Baseline CKD likely due to DM/ HTN.  Here creat 2.5 >> 3.8, in setting of marked vol overload, hx of RV dysfunction by echo, normal LVEF. UP/C ratio 0.8. Possible cardiorenal element.  UA +wbc/ rbc, repeat shows same.  Recovery is stalled for some reason- crt 3.2 to 3.9 without true trend  for several days but now trending worse-  Is it because BP is better controlled ?  Possibly some relative hypotension for him ? With hematuria check serologies for completeness sake which were negative.   No dialysis needs, non oliguric- cont to watch-  Is at least stable  2. Vol overload - as above- Still has a significant edema but much better than admission. Down in weight. Needs to restrict fluid intake here. CXR negative yest. Cont lasix  for now - recently decreased  3. HTN - BP's have been high since arrival high, ^'d norvasc 5  bid, added hydralazine - now at 50 tid. Avoid acei /ARB for now. Cont diuresing - reasonable for now - no change-  Do not want to get BP too low -  Is good where is right now 4. DM2 on insulin 5. Atrial fibrillation - on amio, eliquis 6. LLE cellulitis - sp vanc, on linezolid  7. Morbid obesity 8. R HF - by last echo 02/2019 9. Hyperkalemia-  Got dose of lokelma 6/24 10. Nausea-  Thinking it is the zyvox vs constipation-  zyvox stopped working on constipation- is better 11. Anemia  -  In the 8's-  Will check iron stores and give ESA     Louis Meckel  09/22/2019, 12:33 PM   Recent Labs  Lab 09/21/19 0348 09/22/19 0335  K 5.3* 4.8  BUN 64* 65*  CREATININE 4.14* 4.00*  CALCIUM 8.9 9.2  PHOS  --  5.6*  HGB 8.9* 8.7*   Inpatient medications: . amiodarone  200 mg Oral BID  . amLODipine  10 mg Oral Daily  . apixaban  5 mg Oral BID  . atorvastatin  20 mg Oral Daily  . furosemide  60 mg Intravenous Q12H  . gabapentin  300 mg Oral BID  . hydrALAZINE  50 mg Oral Q8H  . insulin aspart  0-15 Units Subcutaneous TID WC  . insulin aspart  0-5 Units Subcutaneous QHS  . insulin glargine  40 Units Subcutaneous BID  . polyethylene glycol  17 g Oral BID  . senna-docusate  2 tablet Oral BID    acetaminophen **OR** acetaminophen, bisacodyl, labetalol, morphine injection, ondansetron **OR** ondansetron (ZOFRAN) IV, oxyCODONE-acetaminophen **AND**  oxyCODONE

## 2019-09-23 LAB — IRON AND TIBC
Iron: 129 ug/dL (ref 45–182)
Saturation Ratios: 69 % — ABNORMAL HIGH (ref 17.9–39.5)
TIBC: 187 ug/dL — ABNORMAL LOW (ref 250–450)
UIBC: 58 ug/dL

## 2019-09-23 LAB — RENAL FUNCTION PANEL
Albumin: 2.4 g/dL — ABNORMAL LOW (ref 3.5–5.0)
Anion gap: 12 (ref 5–15)
BUN: 68 mg/dL — ABNORMAL HIGH (ref 6–20)
CO2: 25 mmol/L (ref 22–32)
Calcium: 8.6 mg/dL — ABNORMAL LOW (ref 8.9–10.3)
Chloride: 95 mmol/L — ABNORMAL LOW (ref 98–111)
Creatinine, Ser: 4.24 mg/dL — ABNORMAL HIGH (ref 0.61–1.24)
GFR calc Af Amer: 17 mL/min — ABNORMAL LOW (ref >60)
GFR calc non Af Amer: 15 mL/min — ABNORMAL LOW (ref >60)
Glucose, Bld: 66 mg/dL — ABNORMAL LOW (ref 70–99)
Phosphorus: 5.9 mg/dL — ABNORMAL HIGH (ref 2.5–4.6)
Potassium: 4.6 mmol/L (ref 3.5–5.1)
Sodium: 132 mmol/L — ABNORMAL LOW (ref 135–145)

## 2019-09-23 LAB — GLUCOSE, CAPILLARY
Glucose-Capillary: 61 mg/dL — ABNORMAL LOW (ref 70–99)
Glucose-Capillary: 87 mg/dL (ref 70–99)
Glucose-Capillary: 153 mg/dL — ABNORMAL HIGH (ref 70–99)
Glucose-Capillary: 102 mg/dL — ABNORMAL HIGH (ref 70–99)
Glucose-Capillary: 101 mg/dL — ABNORMAL HIGH (ref 70–99)

## 2019-09-23 LAB — FERRITIN: Ferritin: 1057 ng/mL — ABNORMAL HIGH (ref 24–336)

## 2019-09-23 MED ORDER — FUROSEMIDE 40 MG PO TABS
80.0000 mg | ORAL_TABLET | Freq: Two times a day (BID) | ORAL | Status: DC
  Administered 2019-09-23 – 2019-09-28 (×10): 80 mg via ORAL
  Filled 2019-09-23 (×10): qty 2

## 2019-09-23 MED ORDER — DEXTROSE 50 % IV SOLN
INTRAVENOUS | Status: AC
  Filled 2019-09-23: qty 50

## 2019-09-23 MED ORDER — INSULIN GLARGINE 100 UNIT/ML ~~LOC~~ SOLN
30.0000 [IU] | Freq: Two times a day (BID) | SUBCUTANEOUS | Status: DC
  Administered 2019-09-24 – 2019-09-26 (×5): 30 [IU] via SUBCUTANEOUS
  Filled 2019-09-23 (×6): qty 0.3

## 2019-09-23 MED ORDER — FLEET ENEMA 7-19 GM/118ML RE ENEM: 1.0000 | ENEMA | Freq: Once | RECTAL | Status: DC

## 2019-09-23 MED ORDER — GLUCOSE 40 % PO GEL: 1.0000 | ORAL | Status: AC

## 2019-09-23 NOTE — Progress Notes (Signed)
PROGRESS NOTE    Jeffery [name]   DTO:[id number]  DOB: [birth year]  PCP: [name], MD    DOA: 09/07/2019 LOS: 16   Brief Narrative   59 y.o.malewith medical history significant ofObesity, DM2, HTN, P Afib, EtOH use, Charcot's foot, Chronic Pain syndrome, Hx of Cardioversion, diastolic CHF, chronic LE blister and edema s/p excision by podiatry comes with worsening of left lower ext foot pain.  Admitted for sepsis secondary to purulent left lower extremity cellulitis.  Hospital course complicated by AKI.  MRI left leg suggested extensive cellulitis with moderate left knee effusion.  Orthopedic consulted who aspirated left knee fluid suggestive of inflammatory fluid.  Getting Zyvox for his cellulitis.  Nephrology following.  Getting diuretics.   Assessment & Plan    Sepsis secondary to left Lower extremity Cellulitis with some purulent; POA B/L lower extremity chronic skin changes with blister Sepsis physiology improved. MRI-left lower extremity cellulitis but no evidence of abscess/osteomyelitis Ultrasound ABI-difficult to obtain due to blisters Blood cultures negative. Patient seen by podiatry, Dr. Jacqualyn Posey.  Outpatient follow-up with him after discharge. He was initially on vancomycin which was changed over to Zyvox due to his renal issues.  Patient completed about 12 days of treatment.  Zyvox had to be discontinued due to significant nausea.  Infection seems to be under control.   WBC was normal yesterday.  He remains afebrile.  States that his legs are feeling better.    Nausea and vomiting Thought to be either due to Zyvox or constipation.  Zyvox was discontinued.  He was given an enema with which he had a small bowel movement.  Patient's nausea has resolved.    Acute Kidney Injury on CKD Stage IIIa/mild hyperkalemia Baseline creatinine 1.6.  Creatinine slowly continues to rise. Nephrology continues to follow.  Patient was placed on IV diuretics which is being  continued.  He has good urine output.  Potassium is normal today.  Sodium noted to be low at 132. Renal ultrasound was unremarkable.  Echocardiogram showed normal systolic function.   Further management per nephrology.     Volume Overload  Most likely secondary to progression of renal disease.  Patient remains on IV diuretics.  Echocardiogram showed normal systolic function.  No evidence for pulmonary hypertension noted.  Patient was also seen by cardiology.  See above.  Left shoulder and left ankle pain, improved Acute mild gout flare Moderate left knee effusion-inflammatory versus infectious X-ray showed chronic changes.   MRI showed soft tissue inflammation concerning for cellulitis with moderate left knee effusion. -Seen by Ortho, s/p aspiration 6/14 suggestive of inflammatory fluid.   Received colchicine doing better. -Pain control -Continue work with PT/OT-SNF.    Patient apparently reluctant.  TOC team to continue to work on this.   Outpatient follow-up with orthopedics, Dr. Erlinda Hong  Constipation Due to pain medications and lack of mobility.  He was given an enema on 6/24 with partial relief.  Dose of laxatives were increased as well. TSH was 7.37.  Free T4 1.0. Another enema later today if no BM.  Sinus bradycardia Continue amiodarone.  Metoprolol was discontinued.  Essential Hypertension Remains on amlodipine.  Hydralazine as needed.  Blood pressure is reasonably well controlled.  Also on diuretics.  Metoprolol had to be held due to bradycardia.  Heart rate has been better.Marland Kitchen  DM2 with neuropathy, controlled HbA1c 8.0.  He remains on Lantus and SSI.  Noted to have low glucose level this morning.  His CBGs have been running a little  bit on the lower side probably because of his worsening renal function.  We will decrease the dose of his Lantus.  Also on gabapentin for neuropathy.   ParoxysmalA Fib, s/p Cardioversion -On  amiodarone and Eliquis.  LFTs are normal.  TSH noted  to be 7.37 with a normal free T4.  Recommend rechecking thyroid function tests in a few weeks.  Anemia likely due to chronic kidney disease Hemoglobin is low but stable.  No evidence of overt bleeding.    Morbid Obesity Estimated body mass index is 45.11 kg/m as calculated from the following:   Height as of this encounter: 6' 2.02" (1.88 m).   Weight as of this encounter: 159.4 kg.  PT-recommending SNF  DVT prophylaxis:  apixaban (ELIQUIS) tablet 5 mg   Diet:  Diet Orders (From admission, onward)    Start     Ordered   09/17/19 1559  Diet Carb Modified Fluid consistency: Thin; Room service appropriate? Yes; Fluid restriction: 1500 mL Fluid  Diet effective now       Question Answer Comment  Diet-HS Snack? Nothing   Calorie Level Medium 1600-2000   Fluid consistency: Thin   Room service appropriate? Yes   Fluid restriction: 1500 mL Fluid      09/17/19 1558            Code Status: Full Code    Subjective 09/23/19    Patient feels well overall.  His glucose levels were noted to be low this morning.  He has not had any bowel movement.  Denies any abdominal pain.  Passing gas.  Legs feel better.  Disposition Plan & Communication   Status is: Inpatient  Remains inpatient appropriate because:IV treatments appropriate due to intensity of illness or inability to take PO   Dispo:  Patient From: Home  Planned Disposition: To be determined  Expected discharge date: 09/25/19  Medically stable for discharge: No   Family Communication: Discussed in detail with patient.   Consults, Procedures, Significant Events   Consultants:   Nephrology  Cardiology   Antimicrobials:   Zyvox stopped on 6/23   Objective   Vitals:   09/22/19 0506 09/22/19 1453 09/22/19 2039 09/23/19 0602  BP: (!) 153/71 (!) 146/82 (!) 162/74 (!) 152/69  Pulse: 82 90 88 88  Resp: 16 16 20 20   Temp: 97.8 F (36.6 C) 97.7 F (36.5 C) 97.7 F (36.5 C) 97.9 F (36.6 C)  TempSrc: Oral Oral  Oral Oral  SpO2: 98% 97% 97% 96%  Weight: (!) 158.7 kg   (!) 159.4 kg  Height:        Intake/Output Summary (Last 24 hours) at 09/23/2019 1153 Last data filed at 09/23/2019 1000 Gross per 24 hour  Intake 1547 ml  Output 2150 ml  Net -603 ml   Filed Weights   09/20/19 0500 09/22/19 0506 09/23/19 0602  Weight: (!) 160.4 kg (!) 158.7 kg (!) 159.4 kg    Physical Exam:  General appearance: Awake alert.  In no distress Resp: Clear to auscultation bilaterally.  Normal effort Cardio: S1-S2 is normal regular.  No S3-S4.  No rubs murmurs or bruit GI: Abdomen is soft.  Nontender nondistended.  Bowel sounds are present normal.  No masses organomegaly Extremities: Both legs are covered by wraps.  Edema seems to be improving. Neurologic: Alert and oriented x3.  No focal neurological deficits.     Labs   Data Reviewed: I have personally reviewed following labs and imaging studies  CBC: Recent Labs  Lab  09/18/19 0356 09/19/19 0336 09/20/19 0404 09/21/19 0348 09/22/19 0335  WBC 13.2* 13.1* 11.0* 13.2* 10.4  HGB 8.7* 8.7* 8.7* 8.9* 8.7*  HCT 29.2* 28.6* 28.8* 29.7* 28.8*  MCV 90.1 89.4 88.3 89.7 89.4  PLT 350 370 321 259 734   Basic Metabolic Panel: Recent Labs  Lab 09/17/19 0557 09/18/19 0356 09/19/19 0336 09/20/19 0404 09/21/19 0348 09/22/19 0335 09/23/19 0422  NA 133*   < > 137 136 137 136 132*  K 4.8   < > 5.3* 4.9 5.3* 4.8 4.6  CL 102   < > 99 98 98 99 95*  CO2 25   < > 25 27 26 25 25   GLUCOSE 103*   < > 90 112* 82 83 66*  BUN 51*   < > 59* 66* 64* 65* 68*  CREATININE 3.37*   < > 3.51* 3.98* 4.14* 4.00* 4.24*  CALCIUM 8.4*   < > 8.9 9.1 8.9 9.2 8.6*  MG 2.0  --   --   --   --   --   --   PHOS  --   --   --   --   --  5.6* 5.9*   < > = values in this interval not displayed.   GFR: Estimated Creatinine Clearance: 30.7 mL/min (A) (by C-G formula based on SCr of 4.24 mg/dL (H)). CBG: Recent Labs  Lab 09/22/19 1758 09/22/19 2146 09/23/19 0803 09/23/19 0845  09/23/19 1131  GLUCAP 116* 110* 61* 87 153*     Recent Results (from the past 240 hour(s))  SARS CORONAVIRUS 2 (TAT 6-24 HRS) Nasopharyngeal Nasopharyngeal Swab     Status: None   Collection Time: 09/14/19  5:52 PM   Specimen: Nasopharyngeal Swab  Result Value Ref Range Status   SARS Coronavirus 2 NEGATIVE NEGATIVE Final    Comment: (NOTE) SARS-CoV-2 target nucleic acids are NOT DETECTED.  The SARS-CoV-2 RNA is generally detectable in upper and lower respiratory specimens during the acute phase of infection. Negative results do not preclude SARS-CoV-2 infection, do not rule out co-infections with other pathogens, and should not be used as the sole basis for treatment or other patient management decisions. Negative results must be combined with clinical observations, patient history, and epidemiological information. The expected result is Negative.  Fact Sheet for Patients: SugarRoll.be  Fact Sheet for Healthcare Providers: https://www.woods-mathews.com/  This test is not yet approved or cleared by the [place] FDA and  has been authorized for detection and/or diagnosis of SARS-CoV-2 by FDA under an Emergency Use Authorization (EUA). This EUA will remain  in effect (meaning this test can be used) for the duration of the COVID-19 declaration under Se ction 564(b)(1) of the Act, 21 U.S.C. section 360bbb-3(b)(1), unless the authorization is terminated or revoked sooner.  Performed at [facility], [place] [address], [place], [place] [address]       Imaging Studies   No results found.   Medications   Scheduled Meds: . amiodarone  200 mg Oral BID  . amLODipine  10 mg Oral Daily  . apixaban  5 mg Oral BID  . atorvastatin  20 mg Oral Daily  . darbepoetin (ARANESP) injection - NON-DIALYSIS  100 mcg Subcutaneous Q Fri-1800  . dextrose  1 Tube Oral STAT  . dextrose      . furosemide  60 mg Intravenous Q12H  . gabapentin   300 mg Oral BID  . hydrALAZINE  50 mg Oral Q8H  . insulin aspart  0-15  Units Subcutaneous TID WC  . insulin aspart  0-5 Units Subcutaneous QHS  . insulin glargine  30 Units Subcutaneous BID  . polyethylene glycol  17 g Oral BID  . senna-docusate  2 tablet Oral BID  . sodium phosphate  1 enema Rectal Once   Continuous Infusions:     LOS: 16 days      Bonnielee Haff, [facility]  09/23/2019, 11:53 AM    If 7PM-7AM, please contact night-coverage. How to contact the [facility] Attending or Consulting provider [place] or covering provider during after hours [place], for this patient?    1. Check the care team in [facility] and look for a) attending/consulting [facility] provider listed and b) the [facility] team listed 2. Log into [email or web address] and use [place]'s universal password to access. If you do not have the password, please contact the hospital operator. 3. Locate the [facility] provider you are looking for under [facility] and page to a number that you can be directly reached. 4. If you still have difficulty reaching the provider, please page the [facility] (Director on Call) for the Hospitalists listed on amion for assistance.

## 2019-09-23 NOTE — Progress Notes (Signed)
[facility] Progress Note  Subjective:  1751 of UOP on lasix - weight variable - BUN and crt stable to a little worse-   Nausea better-  Constipated but working on that-  BP has been fine-   Vitals:   09/22/19 0506 09/22/19 1453 09/22/19 2039 09/23/19 0602  BP: (!) 153/71 (!) 146/82 (!) 162/74 (!) 152/69  Pulse: 82 90 88 88  Resp: _0 Temp: 97.8 F (36.6 C) 97.7 F (36.5 C) 97.7 F (36.5 C) 97.9 F (36.6 C)  TempSrc: Oral Oral Oral Oral  SpO2: 98% 97% 97% 96%  Weight: (!) 158.7 kg   (!) 159.4 kg  Height:        Exam: Gen alert, no distress No jvd or bruits Chest clear bilat to bases  RRR no MRG Abd soft ntnd no mass or ascites +bs GU normal male Ext  edema but better Neuro is alert, Ox 3 , nf    Date              Creat               eGFR  2012- 18         0.69- 0.90            2019- 2020     0.96- 1.13 May 2019         0.98  May 2021       1.10-  2.31       40- >60 ml/min  September 04, 2019  1.63                 46 Since then worsening - has been 3.3 to 3.8 since 6/12-  Not a clear trend              UA Oct 2018 - negative   UA 08/08/19 - cloudy, 100 prot, few bact, 0-5 rbc, 11-20 wbc   UA 09/10/19 - hazy, large Hb, 30 prot, >50 rbc, 11-20 wbc    Renal US 6/12 - 11.3/ 12.0 cm kidneys, limited exam d/t body habitus, no hydro, relative preservation of cortical thickness   ECHO 02/2019 - LVEF 55-60%, +LVH, mild reduced RV fxn, no valve issues    I/O since admit are 11.2L in and 15.8 L out  = - 4.5 L net    Wt's are down 168kg > 163kd today = - 5kg   BP's have been high since admission   UP/C ratio = 0.8   Assessment/ Plan: 1. AoCKD 3 - baseline creat 1.6, eGFR 46 ml/min. Baseline CKD likely due to DM/ HTN.  Here creat 2.5 >> 3.8, in setting of marked vol overload, hx of RV dysfunction by echo, normal LVEF. UP/C ratio 0.8. Possible cardiorenal element.  UA +wbc/ rbc, repeat shows same.  Recovery is stalled for some reason- crt 3.2 to 3.9  without true trend for several days but now trending worse-  Is it because BP is better controlled ?  Possibly some relative hypotension for him ? With hematuria check serologies for completeness sake which were negative.   No dialysis needs, non oliguric- cont to watch-  Is at least stable  2. Vol overload - as above- Still has a significant edema but much better than admission. Down in weight. Needs to restrict fluid intake here. CXR negative yest. Cont lasix  for now - recently decreased  3. HTN - BP's have been high since arrival high, ^'d norvasc  5 bid, added hydralazine - now at 50 tid. Avoid acei /ARB for now. Cont diuresing - reasonable for now - no change-  Do not want to get BP too low -  Is good where is right now 4. DM2 on insulin 5. Atrial fibrillation - on amio, eliquis 6. LLE cellulitis - sp vanc, on linezolid  7. Morbid obesity 8. R HF - by last echo 02/2019 9. Hyperkalemia-  Got dose of lokelma 6/24 10. Nausea-  Thinking it is the zyvox vs constipation-  zyvox stopped working on constipation- is better 11. Anemia  -  In the 8's-   iron stores OK -  giving ESA     Kellie A Goldsborough  09/23/2019, 1:02 PM   Recent Labs  Lab 09/21/19 0348 09/21/19 0348 09/22/19 0335 09/23/19 0422  K 5.3*   < > 4.8 4.6  BUN 64*   < > 65* 68*  CREATININE 4.14*   < > 4.00* 4.24*  CALCIUM 8.9   < > 9.2 8.6*  PHOS  --   --  5.6* 5.9*  HGB 8.9*  --  8.7*  --    < > = values in this interval not displayed.   Inpatient medications: . amiodarone  200 mg Oral BID  . amLODipine  10 mg Oral Daily  . apixaban  5 mg Oral BID  . atorvastatin  20 mg Oral Daily  . darbepoetin (ARANESP) injection - NON-DIALYSIS  100 mcg Subcutaneous Q Fri-1800  . dextrose  1 Tube Oral STAT  . dextrose      . furosemide  60 mg Intravenous Q12H  . gabapentin  300 mg Oral BID  . hydrALAZINE  50 mg Oral Q8H  . insulin aspart  0-15 Units Subcutaneous TID WC  . insulin aspart  0-5 Units Subcutaneous QHS  .  insulin glargine  30 Units Subcutaneous BID  . polyethylene glycol  17 g Oral BID  . senna-docusate  2 tablet Oral BID  . sodium phosphate  1 enema Rectal Once    acetaminophen **OR** acetaminophen, bisacodyl, labetalol, morphine injection, ondansetron **OR** ondansetron (ZOFRAN) IV, oxyCODONE-acetaminophen **AND** oxyCODONE       

## 2019-09-23 NOTE — Progress Notes (Signed)
Hypoglycemic Event  CBG: 61  Treatment: Orange Juice and peanut butter crackers Symptoms: nausea/vomiting then resolved so pt was able to consume orange juice; tremors, diaphoresis.   Follow-up CBG: [id number] CBG Result:87  Possible Reasons for Event: Pt had no food or drink since the previous day.  Comments/MD notified:MD notified.     Fortunato Curling

## 2019-09-24 ENCOUNTER — Encounter (HOSPITAL_COMMUNITY): Payer: Self-pay | Admitting: Internal Medicine

## 2019-09-24 LAB — CBC
HCT: 25.9 % — ABNORMAL LOW (ref 39.0–52.0)
Hemoglobin: 7.8 g/dL — ABNORMAL LOW (ref 13.0–17.0)
MCH: 26.5 pg (ref 26.0–34.0)
MCHC: 30.1 g/dL (ref 30.0–36.0)
MCV: 88.1 fL (ref 80.0–100.0)
Platelets: 164 10*3/uL (ref 150–400)
RBC: 2.94 MIL/uL — ABNORMAL LOW (ref 4.22–5.81)
RDW: 16.6 % — ABNORMAL HIGH (ref 11.5–15.5)
WBC: 10.7 10*3/uL — ABNORMAL HIGH (ref 4.0–10.5)
nRBC: 0 % (ref 0.0–0.2)

## 2019-09-24 LAB — GLUCOSE, CAPILLARY
Glucose-Capillary: 156 mg/dL — ABNORMAL HIGH (ref 70–99)
Glucose-Capillary: 111 mg/dL — ABNORMAL HIGH (ref 70–99)
Glucose-Capillary: 170 mg/dL — ABNORMAL HIGH (ref 70–99)
Glucose-Capillary: 190 mg/dL — ABNORMAL HIGH (ref 70–99)

## 2019-09-24 LAB — RENAL FUNCTION PANEL
Albumin: 2.6 g/dL — ABNORMAL LOW (ref 3.5–5.0)
Anion gap: 11 (ref 5–15)
BUN: 77 mg/dL — ABNORMAL HIGH (ref 6–20)
CO2: 24 mmol/L (ref 22–32)
Calcium: 8.8 mg/dL — ABNORMAL LOW (ref 8.9–10.3)
Chloride: 96 mmol/L — ABNORMAL LOW (ref 98–111)
Creatinine, Ser: 4.39 mg/dL — ABNORMAL HIGH (ref 0.61–1.24)
GFR calc Af Amer: 16 mL/min — ABNORMAL LOW (ref >60)
GFR calc non Af Amer: 14 mL/min — ABNORMAL LOW (ref >60)
Glucose, Bld: 140 mg/dL — ABNORMAL HIGH (ref 70–99)
Phosphorus: 5.3 mg/dL — ABNORMAL HIGH (ref 2.5–4.6)
Potassium: 4.8 mmol/L (ref 3.5–5.1)
Sodium: 131 mmol/L — ABNORMAL LOW (ref 135–145)

## 2019-09-24 MED ORDER — FLEET ENEMA 7-19 GM/118ML RE ENEM
1.0000 | ENEMA | Freq: Once | RECTAL | Status: DC
  Filled 2019-09-24: qty 1

## 2019-09-24 NOTE — [place] Notes (Signed)
Chief Complaint: Patient was seen in consultation today for renal biopsy  Referring Physician(s): Dr. Moshe Cipro  Supervising Physician: Sandi Mariscal  Patient Status: [facility] - In-pt  History of Present Illness: Jeffery [name] is a 59 y.o. male being worked up for AKI. IR is asked to perform random renal biopsy. PMHx, meds, labs, imaging, allergies reviewed. Pt on Eliquis with hx of chronic A. Fib Eliquis has been held. Feels well, no recent fevers, chills, illness.   Past Medical History:  Diagnosis Date  . Atrial fibrillation ([place])   . Cellulitis and abscess of foot 07/2019   RIGHT FOOT  . Charcot's joint of right foot   . DDD (degenerative disc disease), lumbar   . Diabetes mellitus without complication ([place])   . Fatty liver   . Hypertension   . Obesity   . Rotator cuff disorder   . Shoulder impingement, right   . Spinal stenosis at L4-L5 level     Past Surgical History:  Procedure Laterality Date  . AMPUTATION Left 10/02/2014   Procedure: Left Third toe amputation ;  Surgeon: [name], MD;  Location: [place];  Service: Orthopedics;  Laterality: Left;  Regular bed, wants to follow hip  . ANTERIOR CRUCIATE LIGAMENT REPAIR Right 90   reconstruction  . APPLICATION OF WOUND VAC Left 10/02/2014   Procedure: APPLICATION OF WOUND VAC; toe Surgeon: [name], MD;  Location: [place];  Service: Orthopedics;  Laterality: Left;  . CARDIOVERSION N/A 04/26/2019   Procedure: CARDIOVERSION;  Surgeon: Pixie Casino, MD;  Location: [facility] ENDOSCOPY;  Service: Cardiovascular;  Laterality: N/A;  . CARDIOVERSION N/A 05/18/2019   Procedure: CARDIOVERSION (CATH LAB);  Surgeon: Constance Haw, MD;  Location: [place] CV LAB;  Service: Cardiovascular;  Laterality: N/A;  . I & D EXTREMITY Left 10/05/2014   Procedure: IRRIGATION AND DEBRIDEMENT LEFT FOOT;  Surgeon: [name], MD;  Location: [place];  Service: Orthopedics;  Laterality: Left;  . INCISION AND DRAINAGE Right  08/09/2019   Procedure: INCISION AND DRAINAGE RIGHT FOOT;  Surgeon: Trula Slade, DPM;  Location: [place];  Service: Podiatry;  Laterality: Right;  . KNEE ARTHROSCOPY W/ ACL RECONSTRUCTION Right   . TOTAL KNEE ARTHROPLASTY Right 03/28/2015  . TOTAL KNEE ARTHROPLASTY Right 03/28/2015   Procedure: RIGHT TOTAL KNEE ARTHROPLASTY;  Surgeon: [name], MD;  Location: [place];  Service: Orthopedics;  Laterality: Right;  . WOUND DEBRIDEMENT Right 08/14/2019   Procedure: DEBRIDEMENT WOUND;  Surgeon: Trula Slade, DPM;  Location: [place];  Service: Podiatry;  Laterality: Right;    Allergies: Metformin and related  Medications:  Current Facility-Administered Medications:  .  acetaminophen (TYLENOL) tablet 650 mg, 650 mg, Oral, Q6H PRN, 650 mg at 09/13/19 1024 **OR** acetaminophen (TYLENOL) suppository 650 mg, 650 mg, Rectal, Q6H PRN, Amin, Ankit Chirag, MD .  amiodarone (PACERONE) tablet 200 mg, 200 mg, Oral, BID, Kyere, Belinda K, NP, 200 mg at 09/24/19 1036 .  amLODipine (NORVASC) tablet 10 mg, 10 mg, Oral, Daily, Roney Jaffe, MD, 10 mg at 09/24/19 1036 .  atorvastatin (LIPITOR) tablet 20 mg, 20 mg, Oral, Daily, Amin, Ankit Chirag, MD, 20 mg at 09/24/19 1036 .  bisacodyl (DULCOLAX) EC tablet 5 mg, 5 mg, Oral, Daily PRN, Amin, Ankit Chirag, MD, 5 mg at 09/21/19 0745 .  Darbepoetin Alfa (ARANESP) injection 100 mcg, 100 mcg, Subcutaneous, Q Fri-1800, Corliss Parish, MD, 100 mcg at 09/22/19 1823 .  furosemide (LASIX) tablet 80 mg, 80 mg, Oral,  BID, Corliss Parish, MD, 80 mg at 09/24/19 0900 .  gabapentin (NEURONTIN) capsule 300 mg, 300 mg, Oral, BID, Amin, Ankit Chirag, MD, 300 mg at 09/24/19 1036 .  hydrALAZINE (APRESOLINE) tablet 50 mg, 50 mg, Oral, Q8H, Nicole Kindred A, DO, 50 mg at 09/24/19 0609 .  insulin aspart (novoLOG) injection 0-15 Units, 0-15 Units, Subcutaneous, TID WC, Amin, Ankit Chirag, MD, 3 Units at 09/24/19 0859 .  insulin aspart (novoLOG) injection 0-5 Units,  0-5 Units, Subcutaneous, QHS, Amin, Ankit Chirag, MD, 3 Units at 09/17/19 2119 .  insulin glargine (LANTUS) injection 30 Units, 30 Units, Subcutaneous, BID, Bonnielee Haff, MD, 30 Units at 09/24/19 1037 .  labetalol (NORMODYNE) injection 10 mg, 10 mg, Intravenous, Q2H PRN, Opyd, Timothy S, MD .  morphine 2 MG/ML injection 2 mg, 2 mg, Intravenous, Q2H PRN, Amin, Ankit Chirag, MD, 2 mg at 09/17/19 2134 .  ondansetron (ZOFRAN) tablet 4 mg, 4 mg, Oral, Q6H PRN, 4 mg at 09/21/19 0954 **OR** ondansetron (ZOFRAN) injection 4 mg, 4 mg, Intravenous, Q6H PRN, Amin, Ankit Chirag, MD, 4 mg at 09/23/19 0816 .  oxyCODONE-acetaminophen (PERCOCET/ROXICET) 5-325 MG per tablet 1 tablet, 1 tablet, Oral, Q4H PRN, 1 tablet at 09/24/19 1037 **AND** oxyCODONE (Oxy IR/ROXICODONE) immediate release tablet 5 mg, 5 mg, Oral, Q4H PRN, Wofford, Drew A, RPH, 5 mg at 09/24/19 1037 .  polyethylene glycol (MIRALAX / GLYCOLAX) packet 17 g, 17 g, Oral, BID, Bonnielee Haff, MD, 17 g at 09/24/19 1036 .  senna-docusate (Senokot-S) tablet 2 tablet, 2 tablet, Oral, BID, Bonnielee Haff, MD, 2 tablet at 09/24/19 1036 .  sodium phosphate (FLEET) 7-19 GM/118ML enema 1 enema, 1 enema, Rectal, Once, Bonnielee Haff, MD    Family History  Problem Relation Age of Onset  . Diabetes Father   . Hypertension Father   . Heart failure Father     Social History   Socioeconomic History  . Marital status: Single    Spouse name: Not on file  . Number of children: Not on file  . Years of education: Not on file  . Highest education level: Not on file  Occupational History  . Occupation: Management consultant  Tobacco Use  . Smoking status: Former Smoker    Packs/day: 0.00    Years: 38.00    Pack years: 0.00    Types: Cigarettes  . Smokeless tobacco: Never Used  . Tobacco comment: QUIT 10/2016  Vaping Use  . Vaping Use: Never used  Substance and Sexual Activity  . Alcohol use: No    Alcohol/week: 5.0 - 6.0 standard drinks     Types: 5 - 6 Cans of beer per week  . Drug use: Yes    Types: Marijuana    Comment: last week ; denies 03/24/17  . Sexual activity: Not on file  Other Topics Concern  . Not on file  Social History Narrative   Lives alone.   Social Determinants of Health   Financial Resource Strain:   . Difficulty of Paying Living Expenses:   Food Insecurity:   . Worried About Charity fundraiser in the Last Year:   . Arboriculturist in the Last Year:   Transportation Needs:   . Film/video editor (Medical):   Marland Kitchen Lack of Transportation (Non-Medical):   Physical Activity:   . Days of Exercise per Week:   . Minutes of Exercise per Session:   Stress:   . Feeling of Stress :   Social Connections:   .  Frequency of Communication with Friends and Family:   . Frequency of Social Gatherings with Friends and Family:   . Attends Religious Services:   . Active Member of Clubs or Organizations:   . Attends Archivist Meetings:   Marland Kitchen Marital Status:     Review of Systems: A 12 point ROS discussed and pertinent positives are indicated in the HPI above.  All other systems are negative.  Review of Systems  Vital Signs: BP (!) 154/75 (BP Location: Left Arm)   Pulse 86   Temp 98.1 F (36.7 C) (Oral)   Resp 20   Ht 6' 2.02" (1.88 m)   Wt (!) 159.9 kg   SpO2 96%   BMI 45.25 kg/m   Physical Exam Constitutional:      Appearance: Normal appearance. He is obese. He is not ill-appearing.  HENT:     Mouth/Throat:     Mouth: Mucous membranes are moist.     Pharynx: Oropharynx is clear.  Cardiovascular:     Rate and Rhythm: Normal rate and regular rhythm.     Heart sounds: Normal heart sounds.  Pulmonary:     Effort: Pulmonary effort is normal. No respiratory distress.     Breath sounds: Normal breath sounds.  Abdominal:     General: Abdomen is flat. There is no distension.     Palpations: Abdomen is soft.  Skin:    General: Skin is warm and dry.  Neurological:     General: No focal  deficit present.     Mental Status: He is alert and oriented to person, place, and time.  Psychiatric:        Mood and Affect: Mood normal.        Thought Content: Thought content normal.        Judgment: Judgment normal.     Imaging: DG Chest 2 View  Result Date: 09/15/2019 CLINICAL DATA:  Volume overload EXAM: CHEST - 2 VIEW COMPARISON:  Multiple priors, most recent 09/07/2019 FINDINGS: Prominent, cephalized vascularity compatible with vascular congestion. Lung volumes are low with streaky opacities in the bases favoring atelectasis. Few septal lines are present with some central vascular cuffing. Stable cardiomediastinal contours from prior without frank cardiomegaly on this portable exam. No pneumothorax or effusion. No acute osseous or soft tissue abnormality. Degenerative changes are present in the imaged spine and shoulders. Prior right shoulder rotator cuff repair. Telemetry leads overlie the chest. IMPRESSION: 1. Vascular congestion. Subtle features of at most mild interstitial edema. No effusions. 2. Low lung volumes with streaky opacities in the bases favoring atelectasis. Electronically Signed   By: Lovena Le M.D.   On: 09/15/2019 16:21   DG Chest 2 View  Result Date: 09/07/2019 CLINICAL DATA:  Left shoulder pain. EXAM: CHEST - 2 VIEW COMPARISON:  PA and lateral chest 10/24/2017. FINDINGS: There is cardiomegaly and mild interstitial edema. No consolidative process, pneumothorax or effusion. No acute or focal bony abnormality. IMPRESSION: Cardiomegaly and interstitial edema. Electronically Signed   By: Inge Rise M.D.   On: 09/07/2019 14:40   DG Knee 1-2 Views Left  Result Date: 09/10/2019 CLINICAL DATA:  Left lower extremity cellulitis EXAM: LEFT KNEE - 1-2 VIEW COMPARISON:  None. FINDINGS: Mild degenerative changes in the left knee with joint space narrowing and spurring in all 3 compartments. Small joint effusion. Anterior soft tissue swelling. No acute bony abnormality.  Specifically, no fracture, subluxation, or dislocation. IMPRESSION: Soft tissue swelling. Degenerative changes with small joint effusion. No acute bony abnormality.  Electronically Signed   By: Rolm Baptise M.D.   On: 09/10/2019 19:33   US RENAL  Result Date: 09/09/2019 CLINICAL DATA:  Acute kidney injury EXAM: RENAL / URINARY TRACT ULTRASOUND COMPLETE COMPARISON:  May 14th 2017 FINDINGS: Markedly limited exam due to patient body habitus. Right Kidney: Renal measurements: 11.3 x 6.1 x 6.2 cm = volume: 225 mL. No hydronephrosis. Very limited assessment of the kidneys with estimation of renal size given poor visualization. Parenchymal thickness is grossly preserved. Left Kidney: Renal measurements: 12 x 5.6 x 5.9 (volume = 210) cm = volume: 210 mL. Parenchymal thickness grossly preserved and without signs of hydronephrosis. The very limited assessment due to body habitus as described. Bladder: Appears normal for degree of bladder distention. Other: None. IMPRESSION: 1. Very limited evaluation. No sign of gross hydronephrosis with relative preservation of cortical thickness. Electronically Signed   By: Zetta Bills M.D.   On: 09/09/2019 11:13   MR TIBIA FIBULA LEFT WO CONTRAST  Result Date: 09/08/2019 CLINICAL DATA:  Left leg cellulitis. EXAM: MRI OF LOWER LEFT EXTREMITY WITHOUT CONTRAST TECHNIQUE: Multiplanar, multisequence MR imaging of the left lower leg was performed. No intravenous contrast was administered. COMPARISON:  None. FINDINGS: Bones/Joint/Cartilage No marrow signal abnormality. No fracture or dislocation. Muscles and Tendons Intact. No muscle edema. Severe atrophy of the distal flexor and extensor muscles. Soft tissue Moderate circumferential soft tissue swelling. No fluid collection or hematoma. No soft tissue mass. IMPRESSION: 1. Moderate circumferential soft tissue swelling of the lower leg, consistent with history of cellulitis. No abscess or osteomyelitis. Electronically Signed   By:  Titus Dubin M.D.   On: 09/08/2019 17:57   MR FEMUR LEFT WO CONTRAST  Result Date: 09/08/2019 CLINICAL DATA:  Left leg cellulitis. EXAM: MR OF THE LEFT FEMUR WITHOUT CONTRAST TECHNIQUE: Multiplanar, multisequence MR imaging of the left femur was performed. No intravenous contrast was administered. COMPARISON:  None. FINDINGS: Bones/Joint/Cartilage No marrow signal abnormality. No fracture or dislocation. Moderate left knee effusion without synovitis. Muscles and Tendons Scattered mild patchy edema involving the adductor and quadriceps muscles, nonspecific, but likely related to diabetic muscle changes. No significant muscle atrophy. Soft tissue Moderate soft tissue swelling of the medial and lateral thigh. No fluid collection or hematoma. No soft tissue mass. IMPRESSION: 1. Moderate soft tissue swelling of the medial and lateral aspects of the left thigh, consistent with cellulitis. No abscess or osteomyelitis. 2. Moderate left knee effusion without synovitis, likely reactive. Electronically Signed   By: Titus Dubin M.D.   On: 09/08/2019 17:53   DG Shoulder Left  Result Date: 09/07/2019 CLINICAL DATA:  Left shoulder pain EXAM: LEFT SHOULDER - 2+ VIEW COMPARISON:  03/01/2018 FINDINGS: There is no evidence of fracture or dislocation. Mild-to-moderate arthropathy of the [facility] joint. Glenohumeral joint is within normal limits. Soft tissues are unremarkable. Vascular congestion and interstitial opacities within the visualized left lung. IMPRESSION: 1. No acute osseous abnormality. Mild-to-moderate arthropathy of the left AC joint. 2. Vascular congestion and interstitial opacities within the visualized left lung. Suggest dedicated PA and lateral chest radiographs. Electronically Signed   By: Davina Poke D.O.   On: 09/07/2019 13:34   DG Foot Complete Left  Result Date: 09/07/2019 CLINICAL DATA:  Left foot pain EXAM: LEFT FOOT - COMPLETE 3+ VIEW COMPARISON:  08/28/2019 FINDINGS: Prior third digit  amputation at the third MTP joint. No acute fracture identified. Pes planus. Moderate arthropathy at the tarsometatarsal joints with prominent dorsal hypertrophy. Chronic deformity of the second digit proximal phalanx.  No cortical destruction or periostitis is seen. There is diffuse soft tissue prominence. No soft tissue gas. IMPRESSION: 1. No acute osseous abnormality. No radiographic evidence of acute osteomyelitis. 2. Diffuse soft tissue prominence. 3. Moderate arthropathy at the tarsometatarsal joints. Electronically Signed   By: Davina Poke D.O.   On: 09/07/2019 13:38   DG Foot Complete Right  Result Date: 09/07/2019 CLINICAL DATA:  Chronic right foot pain EXAM: RIGHT FOOT COMPLETE - 3+ VIEW COMPARISON:  08/07/2019 FINDINGS: Chronic findings of Charcot arthropathy of the right midfoot with fragmentation and remodeling of the osseous structures. No appreciable interval progression from prior radiograph. No new or acute fracture identified. There is a appears to be a soft tissue wound or defect the lateral aspect of the foot near the fifth metatarsal base. Progressive destructive changes at the fourth and fifth metatarsal bases could reflect changes related to progressive Charcot arthropathy versus osteomyelitis. IMPRESSION: 1. Findings of Charcot arthropathy of the right midfoot with progressive destructive changes at the fourth and fifth metatarsal bases could reflect changes related to progressive Charcot arthropathy versus osteomyelitis. Correlation with serum inflammatory markers is suggested. An MRI could also be considered, although sensitivity for detection of osteomyelitis is decreased in the setting of concurrent neuropathic arthropathy. 2. Soft tissue wound or defect the lateral aspect of the foot near the fifth metatarsal base. Electronically Signed   By: Davina Poke D.O.   On: 09/07/2019 13:43   ECHOCARDIOGRAM COMPLETE  Result Date: 09/18/2019    ECHOCARDIOGRAM REPORT   Patient  Name:   THEADORE BLUNCK Date of Exam: 09/18/2019 Medical Rec #:  [id number]           Height:       74.0 in Accession #:    [id number]          Weight:       354.9 lb Date of Birth:  04-Oct-1960           BSA:          2.773 m Patient Age:    10 years            BP:           146/74 mmHg Patient Gender: M                   HR:           84 bpm. Exam Location:  Inpatient Procedure: 2D Echo, Cardiac Doppler and Color Doppler Indications:    Volume Overload. [id number]  History:        Patient has prior history of Echocardiogram examinations, most                 recent 02/28/2019.  Sonographer:    Tiffany Dance Referring Phys: [id number] [place]  1. Normal LV function; moderate LVE; mild LAE.  2. Left ventricular ejection fraction, by estimation, is 60 to 65%. The left ventricle has normal function. The left ventricle has no regional wall motion abnormalities. The left ventricular internal cavity size was moderately dilated. Left ventricular diastolic parameters were normal.  3. Right ventricular systolic function is normal. The right ventricular size is normal.  4. Left atrial size was mildly dilated.  5. The mitral valve is normal in structure. No evidence of mitral valve regurgitation. No evidence of mitral stenosis.  6. The aortic valve is normal in structure. Aortic valve regurgitation is not visualized. No aortic stenosis is present.  7. The inferior vena cava  is normal in size with greater than 50% respiratory variability, suggesting right atrial pressure of 3 mmHg. FINDINGS  Left Ventricle: Left ventricular ejection fraction, by estimation, is 60 to 65%. The left ventricle has normal function. The left ventricle has no regional wall motion abnormalities. The left ventricular internal cavity size was moderately dilated. There is no left ventricular hypertrophy. Left ventricular diastolic parameters were normal. Right Ventricle: The right ventricular size is normal.Right ventricular systolic  function is normal. Left Atrium: Left atrial size was mildly dilated. Right Atrium: Right atrial size was normal in size. Pericardium: Trivial pericardial effusion is present. Mitral Valve: The mitral valve is normal in structure. Normal mobility of the mitral valve leaflets. No evidence of mitral valve regurgitation. No evidence of mitral valve stenosis. Tricuspid Valve: The tricuspid valve is normal in structure. Tricuspid valve regurgitation is trivial. No evidence of tricuspid stenosis. Aortic Valve: The aortic valve is normal in structure. Aortic valve regurgitation is not visualized. No aortic stenosis is present. Pulmonic Valve: The pulmonic valve was normal in structure. Pulmonic valve regurgitation is not visualized. No evidence of pulmonic stenosis. Aorta: The aortic root is normal in size and structure. Venous: The inferior vena cava is normal in size with greater than 50% respiratory variability, suggesting right atrial pressure of 3 mmHg. IAS/Shunts: No atrial level shunt detected by color flow Doppler. Additional Comments: Normal LV function; moderate LVE; mild LAE.  LEFT VENTRICLE PLAX 2D LVIDd:         6.00 cm  Diastology LVIDs:         4.30 cm  LV e' lateral:   11.50 cm/s LV PW:         1.10 cm  LV E/e' lateral: 8.6 LV IVS:        1.20 cm  LV e' medial:    9.46 cm/s LVOT diam:     2.20 cm  LV E/e' medial:  10.5 LV SV:         98 LV SV Index:   35 LVOT Area:     3.80 cm  RIGHT VENTRICLE             IVC RV Basal diam:  3.60 cm     IVC diam: 2.00 cm RV Mid diam:    3.10 cm RV S prime:     19.10 cm/s TAPSE (M-mode): 2.3 cm LEFT ATRIUM              Index       RIGHT ATRIUM           Index LA diam:        5.20 cm  1.87 cm/m  RA Area:     24.40 cm LA Vol (A2C):   135.0 ml 48.68 ml/m RA Volume:   70.20 ml  25.31 ml/m LA Vol (A4C):   107.0 ml 38.58 ml/m LA Biplane Vol: 123.0 ml 44.35 ml/m  AORTIC VALVE LVOT Vmax:   116.00 cm/s LVOT Vmean:  76.200 cm/s LVOT VTI:    0.259 m  AORTA Ao Root diam: 4.00 cm  Ao Asc diam:  3.40 cm MITRAL VALVE MV Area (PHT): 2.11 cm    SHUNTS MV Decel Time: 359 msec    Systemic VTI:  0.26 m MV E velocity: 98.90 cm/s  Systemic Diam: 2.20 cm MV A velocity: 87.00 cm/s MV E/A ratio:  1.14 Kirk Ruths MD Electronically signed by Kirk Ruths MD Signature Date/Time: 09/18/2019/3:36:52 PM    Final    US Abdomen Limited  RUQ  Result Date: 09/11/2019 CLINICAL DATA:  Elevated LFTs. History fatty liver, hypertension, obesity and diabetes. EXAM: ULTRASOUND ABDOMEN LIMITED RIGHT UPPER QUADRANT COMPARISON:  Right upper quadrant abdominal ultrasound-08/11/2015 FINDINGS: Examination is degraded due to patient body habitus, bowel gas and poor sonographic window Gallbladder: About the gallbladder is underdistended and poorly visualized. Given this limitation, there is no gallbladder wall thickening or evidence of echogenic gallstones or biliary sludge. Negative sonographic Murphy sign. Common bile duct: Diameter: Obscured by bowel gas. Liver: There is mild diffuse increased slightly coarsened echogenicity of the hepatic parenchyma. No discrete hepatic lesions. No intrahepatic biliary duct dilatation. No ascites. Portal vein is patent on color Doppler imaging with normal direction of blood flow towards the liver. Other: None. IMPRESSION: 1. Degraded examination secondary to patient body habitus and bowel gas. 2. Similar findings suggestive of hepatic steatosis. Electronically Signed   By: Sandi Mariscal M.D.   On: 09/11/2019 07:53    Labs:  CBC: Recent Labs    09/20/19 0404 09/21/19 0348 09/22/19 0335 09/24/19 0439  WBC 11.0* 13.2* 10.4 10.7*  HGB 8.7* 8.9* 8.7* 7.8*  HCT 28.8* 29.7* 28.8* 25.9*  PLT 321 259 223 164    COAGS: Recent Labs    08/07/19 1900 08/07/19 2143  INR 1.7*  --   APTT  --  37*    BMP: Recent Labs    09/21/19 0348 09/22/19 0335 09/23/19 0422 09/24/19 0439  NA 137 136 132* 131*  K 5.3* 4.8 4.6 4.8  CL 98 99 95* 96*  CO2 26 25 25 24   GLUCOSE 82  83 66* 140*  BUN 64* 65* 68* 77*  CALCIUM 8.9 9.2 8.6* 8.8*  CREATININE 4.14* 4.00* 4.24* 4.39*  GFRNONAA 15* 16* 15* 14*  GFRAA 17* 18* 17* 16*    LIVER FUNCTION TESTS: Recent Labs    09/08/19 0344 09/08/19 0344 09/09/19 0620 09/09/19 0620 09/10/19 4097 09/11/19 0558 09/19/19 0336 09/22/19 0335 09/23/19 0422 09/24/19 0439  BILITOT 0.8  --  0.8  --  0.6  --  1.0  --   --   --   AST 21  --  52*  --  91*  --  23  --   --   --   ALT 23  --  40  --  66*  --  21  --   --   --   ALKPHOS 64  --  78  --  77  --  61  --   --   --   PROT 6.2*  --  7.2  --  6.8  --  7.2  --   --   --   ALBUMIN 1.9*   < > 2.1*   < > 2.0*   < > 2.4* 2.5* 2.4* 2.6*   < > = values in this interval not displayed.    TUMOR MARKERS: No results for input(s): AFPTM, CEA, CA199, CHROMGRNA in the last 8760 hours.  Assessment and Plan: AKI For image guided ([place] or CT) random renal biopsy. Eliquis needs to be held 48 hrs, so agree with plan for biopsy on Tues 6/29 BP fine, will check coags Risks and benefits of renal biopsy was discussed with the patient and/or patient's family including, but not limited to bleeding, infection, damage to adjacent structures or low yield requiring additional tests.  All of the questions were answered and there is agreement to proceed.  Consent signed and in chart.    Thank you for this interesting  consult.  I greatly enjoyed meeting FELDER LEBEDA and look forward to participating in their care.  A copy of this report was sent to the requesting provider on this date.  Electronically Signed: Ascencion Dike, PA-C 09/24/2019, 12:43 PM   I spent a total of 20 minutes in face to face in clinical consultation, greater than 50% of which was counseling/coordinating care for renal bx

## 2019-09-24 NOTE — Progress Notes (Signed)
[facility] Progress Note  Subjective:  2625 of UOP on lasix - weight stable - BUN and crt  Worse again-   Nausea better-  Constipated but working on that-  BP has been fine-   Vitals:   09/23/19 0602 09/23/19 1353 09/23/19 2013 09/24/19 0523  BP: (!) 152/69 (!) 150/74 (!) 143/65 (!) 154/75  Pulse: 88 94 85 86  Resp: 20 14 20 20   Temp: 97.9 F (36.6 C) 97.7 F (36.5 C) 99.5 F (37.5 C) 98.1 F (36.7 C)  TempSrc: Oral Oral  Oral  SpO2: 96% 97% 93% 96%  Weight: (!) 159.4 kg   (!) 159.9 kg  Height:        Exam: Gen alert, no distress No jvd or bruits Chest clear bilat to bases  RRR no MRG Abd soft ntnd no mass or ascites +bs GU normal male Ext  edema but better Neuro is alert, Ox 3 , nf    Date              Creat               eGFR  2012- 18         0.69- 0.90            2019- 2020     0.96- 1.13 May 2019         0.98  May 2021       1.10-  2.31       40- >60 ml/min  September 04, 2019  1.63                 46 Since then worsening - has been 3.3 to 3.8 since 6/12-  Not a clear trend              UA Oct 2018 - negative   UA 08/08/19 - cloudy, 100 prot, few bact, 0-5 rbc, 11-20 wbc   UA 09/10/19 - hazy, large Hb, 30 prot, >50 rbc, 11-20 wbc    Renal US 6/12 - 11.3/ 12.0 cm kidneys, limited exam d/t body habitus, no hydro, relative preservation of cortical thickness   ECHO 02/2019 - LVEF 55-60%, +LVH, mild reduced RV fxn, no valve issues    I/O since admit are 11.2L in and 15.8 L out  = - 4.5 L net    Wt's are down 168kg > 163kd today = - 5kg   BP's have been high since admission   UP/C ratio = 0.8   Assessment/ Plan: 1. AoCKD 3 - baseline creat 1.6, eGFR 46 ml/min. Baseline CKD likely due to DM/ HTN.  Here creat 2.5 >> 3.8, in setting of marked vol overload, hx of RV dysfunction by echo, normal LVEF. UP/C ratio 0.8. Possible cardiorenal element.  UA +wbc/ rbc, repeat shows same.  Recovery is stalled for some reason- crt 3.2 to 3.9 without true trend for  several days but now trending worse-  Is it because BP is better controlled ?  Possibly some relative hypotension for him ? With hematuria check serologies for completeness sake which were negative.   No dialysis needs, non oliguric- I really dont know what I am missing here-  The only thing we havent done is a kidney biopsy-  I discussed with pt-  He is willing to do -  On eliquis-  Will hold but probably could not do biopsy until Tuesday-  Will order thru IR 2. Vol overload - as above- Still has a  significant edema but much better than admission. Down in weight. Needs to restrict fluid intake here. CXR negative yest. Cont lasix  for now - dose recently decreased  3. HTN - BP's have been high since arrival high, ^'d norvasc 5 bid, added hydralazine - now at 50 tid. Avoid acei /ARB for now. Cont diuresing - reasonable for now - no change-  Do not want to get BP too low -  Is good where is right now 4. DM2 on insulin 5. Atrial fibrillation - on amio, eliquis 6. LLE cellulitis - sp vanc, on linezolid  7. Morbid obesity 8. R HF - by last echo 02/2019 9. Hyperkalemia-  Got dose of lokelma 6/24 10. Nausea-  Thinking it is the zyvox vs constipation-  zyvox stopped working on constipation- is better 11. Anemia  -  In the 8's-   iron stores OK -  giving ESA     Louis Meckel  09/24/2019, 10:32 AM   Recent Labs  Lab 09/22/19 0335 09/22/19 0335 09/23/19 0422 09/24/19 0439  K 4.8   < > 4.6 4.8  BUN 65*   < > 68* 77*  CREATININE 4.00*   < > 4.24* 4.39*  CALCIUM 9.2   < > 8.6* 8.8*  PHOS 5.6*   < > 5.9* 5.3*  HGB 8.7*  --   --  7.8*   < > = values in this interval not displayed.   Inpatient medications: . amiodarone  200 mg Oral BID  . amLODipine  10 mg Oral Daily  . apixaban  5 mg Oral BID  . atorvastatin  20 mg Oral Daily  . darbepoetin (ARANESP) injection - NON-DIALYSIS  100 mcg Subcutaneous Q Fri-1800  . furosemide  80 mg Oral BID  . gabapentin  300 mg Oral BID  . hydrALAZINE   50 mg Oral Q8H  . insulin aspart  0-15 Units Subcutaneous TID WC  . insulin aspart  0-5 Units Subcutaneous QHS  . insulin glargine  30 Units Subcutaneous BID  . polyethylene glycol  17 g Oral BID  . senna-docusate  2 tablet Oral BID  . sodium phosphate  1 enema Rectal Once    acetaminophen **OR** acetaminophen, bisacodyl, labetalol, morphine injection, ondansetron **OR** ondansetron (ZOFRAN) IV, oxyCODONE-acetaminophen **AND** oxyCODONE

## 2019-09-24 NOTE — Progress Notes (Signed)
PROGRESS NOTE    Jeffery [name]   JJH:[id number]  DOB: 10/26/1960  PCP: [name], MD    DOA: 09/07/2019 LOS: 17   Brief Narrative   59 y.o.malewith medical history significant ofObesity, DM2, HTN, P Afib, EtOH use, Charcot's foot, Chronic Pain syndrome, Hx of Cardioversion, diastolic CHF, chronic LE blister and edema s/p excision by podiatry comes with worsening of left lower ext foot pain.  Admitted for sepsis secondary to purulent left lower extremity cellulitis.  Hospital course complicated by AKI.  MRI left leg suggested extensive cellulitis with moderate left knee effusion.  Orthopedic consulted who aspirated left knee fluid suggestive of inflammatory fluid.  Getting Zyvox for his cellulitis.  Nephrology following.  Getting diuretics.   Assessment & Plan    Sepsis secondary to left Lower extremity Cellulitis with some purulent; POA B/L lower extremity chronic skin changes Sepsis physiology improved. MRI-left lower extremity cellulitis but no evidence of abscess/osteomyelitis Ultrasound ABI-difficult to obtain due to blisters Blood cultures negative. Patient seen by podiatry, Dr. Jacqualyn Posey.  Outpatient follow-up with him after discharge. He was initially on vancomycin which was changed over to Zyvox due to his renal issues.  Patient completed about 12 days of treatment.  Zyvox had to be discontinued due to significant nausea.   Patient feels that her legs have improved.  No complaints offered.  Continue to monitor.  Outpatient follow-up with podiatry.     Acute Kidney Injury on CKD Stage IIIa/mild hyperkalemia Baseline creatinine 1.6.  Nephrology continues to follow.   Patient was placed on IV diuretics.  Echocardiogram showed normal systolic function. Etiology of renal disease is not known.  Biopsy was offered by nephrology today and patient is agreeable.  Eliquis will need to be stopped for the same. Patient continues to have good urine output.  Potassium is normal  today.  Sodium 131.   Renal ultrasound was unremarkable.  Echocardiogram showed normal systolic function.    Volume Overload  Most likely secondary to progression of renal disease.  Patient remains on IV diuretics.  Echocardiogram showed normal systolic function.  No evidence for pulmonary hypertension noted.  Patient was also seen by cardiology.  See above.  Nausea and vomiting Thought to be either due to Zyvox or constipation.  Zyvox was discontinued.  Symptoms have improved.  Constipation Due to pain medications and lack of mobility.  He was given an enema on 6/24 with partial relief.  Dose of laxatives were increased as well. TSH was 7.37.  Free T4 1.0. Enema to be given today.  He did not get one yesterday.  Left shoulder and left ankle pain, improved Acute mild gout flare Moderate left knee effusion-inflammatory versus infectious X-ray showed chronic changes.   MRI showed soft tissue inflammation concerning for cellulitis with moderate left knee effusion. -Seen by Ortho, s/p aspiration 6/14 suggestive of inflammatory fluid.    He also received colchicine. Doing much better.  Able to move his knee better than before.  Looking forward to working with physical therapy.  Outpatient follow-up with orthopedics, Dr. Erlinda Hong  Sinus bradycardia Continue amiodarone.  Metoprolol was discontinued.  Heart rate is stable.  Essential Hypertension Remains on amlodipine.  Hydralazine as needed.  Blood pressure is reasonably well controlled.  Also on diuretics.  Metoprolol had to be held due to bradycardia.   DM2 with neuropathy, controlled HbA1c 8.0.  He remains on Lantus and SSI.  Hypoglycemia yesterday.  Dose of insulin was adjusted.  Likely has lower requirements due to worsening  renal function.  Continue gabapentin.  May need to adjust the dose of this medication as well.    ParoxysmalA Fib, s/p Cardioversion -On  amiodarone and Eliquis.  LFTs are normal.  Eliquis to be held for renal  biopsy. TSH noted to be 7.37 with a normal free T4.  Recommend rechecking thyroid function tests in a few weeks.  Anemia likely due to chronic kidney disease Hemoglobin noted to be lower today compared to 2 days ago.  No overt bleeding.  Recheck tomorrow.  Morbid Obesity Estimated body mass index is 45.25 kg/m as calculated from the following:   Height as of this encounter: 6' 2.02" (1.88 m).   Weight as of this encounter: 159.9 kg.  PT-recommending SNF  DVT prophylaxis:    Diet:  Diet Orders (From admission, onward)    Start     Ordered   09/17/19 1559  Diet Carb Modified Fluid consistency: Thin; Room service appropriate? Yes; Fluid restriction: 1500 mL Fluid  Diet effective now       Question Answer Comment  Diet-HS Snack? Nothing   Calorie Level Medium 1600-2000   Fluid consistency: Thin   Room service appropriate? Yes   Fluid restriction: 1500 mL Fluid      09/17/19 1558            Code Status: Full Code    Subjective 09/24/19    Patient states that he is feeling well.  Making urine.  No abdominal pain.  Legs feel better.  Still constipated.  Disposition Plan & Communication   Status is: Inpatient  Remains inpatient appropriate because:IV treatments appropriate due to intensity of illness or inability to take PO   Dispo:  Patient From: Home  Planned Disposition: Home  Expected discharge date: 09/25/19  Medically stable for discharge: No   Family Communication: Discussed in detail with patient.   Consults, Procedures, Significant Events   Consultants:   Nephrology  Cardiology   Antimicrobials:   Zyvox stopped on 6/23   Objective   Vitals:   09/23/19 0602 09/23/19 1353 09/23/19 2013 09/24/19 0523  BP: (!) 152/69 (!) 150/74 (!) 143/65 (!) 154/75  Pulse: 88 94 85 86  Resp: 20 14 20 20   Temp: 97.9 F (36.6 C) 97.7 F (36.5 C) 99.5 F (37.5 C) 98.1 F (36.7 C)  TempSrc: Oral Oral  Oral  SpO2: 96% 97% 93% 96%  Weight: (!) 159.4 kg    (!) 159.9 kg  Height:        Intake/Output Summary (Last 24 hours) at 09/24/2019 1148 Last data filed at 09/24/2019 1100 Gross per 24 hour  Intake 1080 ml  Output 2750 ml  Net -1670 ml   Filed Weights   09/22/19 0506 09/23/19 0602 09/24/19 0523  Weight: (!) 158.7 kg (!) 159.4 kg (!) 159.9 kg    Physical Exam:  General appearance: Awake alert.  In no distress Resp: Clear to auscultation bilaterally.  Normal effort Cardio: S1-S2 is normal regular.  No S3-S4.  No rubs murmurs or bruit GI: Abdomen is soft.  Nontender nondistended.  Bowel sounds are present normal.  No masses organomegaly Extremities: Legs covered in dressing Neurologic: Alert and oriented x3.  No focal neurological deficits.      Labs   Data Reviewed: I have personally reviewed following labs and imaging studies  CBC: Recent Labs  Lab 09/19/19 0336 09/20/19 0404 09/21/19 0348 09/22/19 0335 09/24/19 0439  WBC 13.1* 11.0* 13.2* 10.4 10.7*  HGB 8.7* 8.7* 8.9* 8.7* 7.8*  HCT 28.6* 28.8* 29.7* 28.8* 25.9*  MCV 89.4 88.3 89.7 89.4 88.1  PLT 370 321 259 223 614   Basic Metabolic Panel: Recent Labs  Lab 09/20/19 0404 09/21/19 0348 09/22/19 0335 09/23/19 0422 09/24/19 0439  NA 136 137 136 132* 131*  K 4.9 5.3* 4.8 4.6 4.8  CL 98 98 99 95* 96*  CO2 27 26 25 25 24   GLUCOSE 112* 82 83 66* 140*  BUN 66* 64* 65* 68* 77*  CREATININE 3.98* 4.14* 4.00* 4.24* 4.39*  CALCIUM 9.1 8.9 9.2 8.6* 8.8*  PHOS  --   --  5.6* 5.9* 5.3*   GFR: Estimated Creatinine Clearance: 29.8 mL/min (A) (by C-G formula based on SCr of 4.39 mg/dL (H)). CBG: Recent Labs  Lab 09/23/19 0845 09/23/19 1131 09/23/19 1754 09/23/19 2112 09/24/19 0752  GLUCAP 87 153* 102* 101* 156*     Recent Results (from the past 240 hour(s))  SARS CORONAVIRUS 2 (TAT 6-24 HRS) Nasopharyngeal Nasopharyngeal Swab     Status: None   Collection Time: 09/14/19  5:52 PM   Specimen: Nasopharyngeal Swab  Result Value Ref Range Status   SARS  Coronavirus 2 NEGATIVE NEGATIVE Final    Comment: (NOTE) SARS-CoV-2 target nucleic acids are NOT DETECTED.  The SARS-CoV-2 RNA is generally detectable in upper and lower respiratory specimens during the acute phase of infection. Negative results do not preclude SARS-CoV-2 infection, do not rule out co-infections with other pathogens, and should not be used as the sole basis for treatment or other patient management decisions. Negative results must be combined with clinical observations, patient history, and epidemiological information. The expected result is Negative.  Fact Sheet for Patients: SugarRoll.be  Fact Sheet for Healthcare Providers: https://www.woods-mathews.com/  This test is not yet approved or cleared by the [place] FDA and  has been authorized for detection and/or diagnosis of SARS-CoV-2 by FDA under an Emergency Use Authorization (EUA). This EUA will remain  in effect (meaning this test can be used) for the duration of the COVID-19 declaration under Se ction 564(b)(1) of the Act, 21 U.S.C. section 360bbb-3(b)(1), unless the authorization is terminated or revoked sooner.  Performed at [facility], [place] [address] [place], [place] [address]       Imaging Studies   No results found.   Medications   Scheduled Meds: . amiodarone  200 mg Oral BID  . amLODipine  10 mg Oral Daily  . atorvastatin  20 mg Oral Daily  . darbepoetin (ARANESP) injection - NON-DIALYSIS  100 mcg Subcutaneous Q Fri-1800  . furosemide  80 mg Oral BID  . gabapentin  300 mg Oral BID  . hydrALAZINE  50 mg Oral Q8H  . insulin aspart  0-15 Units Subcutaneous TID WC  . insulin aspart  0-5 Units Subcutaneous QHS  . insulin glargine  30 Units Subcutaneous BID  . polyethylene glycol  17 g Oral BID  . senna-docusate  2 tablet Oral BID  . sodium phosphate  1 enema Rectal Once   Continuous Infusions:     LOS: 17 days      Bonnielee Haff, [facility]  09/24/2019, 11:48 AM    If 7PM-7AM, please contact night-coverage. How to contact the [facility] Attending or Consulting provider [place] or covering provider during after hours [place], for this patient?    1. Check the care team in [facility] and look for a) attending/consulting [facility] provider listed and b) the [facility] team listed 2. Log into [email or web address] and  use [place] universal password to access. If you do not have the password, please contact the hospital operator. 3. Locate the [facility] provider you are looking for under [facility] and page to a number that you can be directly reached. 4. If you still have difficulty reaching the provider, please page the [facility] (Director on Call) for the Hospitalists listed on amion for assistance.

## 2019-09-25 LAB — RENAL FUNCTION PANEL
Albumin: 2.5 g/dL — ABNORMAL LOW (ref 3.5–5.0)
Anion gap: 12 (ref 5–15)
BUN: 75 mg/dL — ABNORMAL HIGH (ref 6–20)
CO2: 22 mmol/L (ref 22–32)
Calcium: 8.6 mg/dL — ABNORMAL LOW (ref 8.9–10.3)
Chloride: 100 mmol/L (ref 98–111)
Creatinine, Ser: 4.91 mg/dL — ABNORMAL HIGH (ref 0.61–1.24)
GFR calc Af Amer: 14 mL/min — ABNORMAL LOW (ref >60)
GFR calc non Af Amer: 12 mL/min — ABNORMAL LOW (ref >60)
Glucose, Bld: 100 mg/dL — ABNORMAL HIGH (ref 70–99)
Phosphorus: 4.6 mg/dL (ref 2.5–4.6)
Potassium: 4.8 mmol/L (ref 3.5–5.1)
Sodium: 134 mmol/L — ABNORMAL LOW (ref 135–145)

## 2019-09-25 LAB — CBC
HCT: 25 % — ABNORMAL LOW (ref 39.0–52.0)
Hemoglobin: 7.6 g/dL — ABNORMAL LOW (ref 13.0–17.0)
MCH: 26.8 pg (ref 26.0–34.0)
MCHC: 30.4 g/dL (ref 30.0–36.0)
MCV: 88 fL (ref 80.0–100.0)
Platelets: 136 10*3/uL — ABNORMAL LOW (ref 150–400)
RBC: 2.84 MIL/uL — ABNORMAL LOW (ref 4.22–5.81)
RDW: 16.4 % — ABNORMAL HIGH (ref 11.5–15.5)
WBC: 10.2 10*3/uL (ref 4.0–10.5)
nRBC: 0 % (ref 0.0–0.2)

## 2019-09-25 LAB — APTT: aPTT: 43 seconds — ABNORMAL HIGH (ref 24–36)

## 2019-09-25 LAB — GLUCOSE, CAPILLARY
Glucose-Capillary: 134 mg/dL — ABNORMAL HIGH (ref 70–99)
Glucose-Capillary: 111 mg/dL — ABNORMAL HIGH (ref 70–99)
Glucose-Capillary: 139 mg/dL — ABNORMAL HIGH (ref 70–99)
Glucose-Capillary: 108 mg/dL — ABNORMAL HIGH (ref 70–99)

## 2019-09-25 LAB — PROTIME-INR
INR: 1.8 — ABNORMAL HIGH (ref 0.8–1.2)
Prothrombin Time: 19.8 seconds — ABNORMAL HIGH (ref 11.4–15.2)

## 2019-09-25 NOTE — Progress Notes (Signed)
Physical Therapy Treatment Patient Details Name: Jeffery [name] MRN: [id number] DOB: [birth year] Today's Date: 09/25/2019    History of Present Illness 59 y.o. male with medical history significant of Obesity, DM2, HTN, P Afib, EtOH use, Charcot's foot, Chronic Pain syndrome, Hx of Cardioversion, diastolic CHF, chronic LE blister and edema s/p excision by podiatry comes with worsening of left lower ext foot pain.  Hospital course complicated by AKI.  MRI left leg suggested extensive cellulitis with moderate left knee effusion.  Orthopedic consulted who aspirated left knee fluid suggestive of inflammatory fluid.  Nephrology continues to follow. Plan for kidney biopsy on 09/26/19.    PT Comments    Patient progressing steadily with acute PT. He was agreeable to ambulate today with some encouragement from PT. Patient initiated stair training as well as he wishes to discharge home and no longer wants to rehab at SNF level. Patient continues to require intermittent min assist and cues for safety with use of RW, especially during turns. He was able to complete stairs with step to pattern and 2 hand rails, no overt LOB however cues needed for safety. Acute PT will continue to progress as able. Pt will benefit from HHPT once medically safe to discharge home.    Follow Up Recommendations  Home health PT;Supervision for mobility/OOB     Equipment Recommendations  Rolling walker with 5" wheels;3in1 (PT) (wide RW)    Recommendations for Other Services       Precautions / Restrictions Precautions Precautions: Fall Restrictions Weight Bearing Restrictions: No RLE Weight Bearing: Weight bearing as tolerated LLE Weight Bearing: Weight bearing as tolerated    Mobility  Bed Mobility Overal bed mobility: Modified Independent Bed Mobility: Supine to Sit;Sit to Supine     Supine to sit: HOB elevated;Modified independent (Device/Increase time) Sit to supine: Modified independent (Device/Increase  time);HOB elevated   General bed mobility comments: HOB slightly elevated, pt requires increased time and is using bed rail.   Transfers Overall transfer level: Needs assistance Equipment used: Rolling walker (2 wheeled) Transfers: Sit to/from Stand Sit to Stand: Supervision;From elevated surface         General transfer comment: bed height elevated, supervision for safety.  Ambulation/Gait Ambulation/Gait assistance: Min guard Gait Distance (Feet): 300 Feet Assistive device: Rolling walker (2 wheeled) Gait Pattern/deviations: Step-through pattern;Wide base of support;Decreased stride length;Trunk flexed Gait velocity: fair   General Gait Details: pt steady and no LOB noted. cues to maintain safe proximity to RW intermittently. cues needed for safety with turning with RW as pt swings walker outside BOS.   Stairs Stairs: Yes Stairs assistance: Min guard;+2 safety/equipment Stair Management: Two rails;Step to pattern;Forwards Number of Stairs: 6 (2x3) General stair comments: cues to ascend/descend wiht step to pattern. Pt going up with Rt and down with Lt due to increased knee pain in Lt knee. cues required with safety to position walker and transition hands to rails.    Wheelchair Mobility    Modified Rankin (Stroke Patients Only)       Balance Overall balance assessment: Needs assistance Sitting-balance support: Feet supported Sitting balance-Leahy Scale: Good     Standing balance support: Bilateral upper extremity supported;During functional activity Standing balance-Leahy Scale: Fair             Cognition Arousal/Alertness: Awake/alert Behavior During Therapy: WFL for tasks assessed/performed Overall Cognitive Status: Within Functional Limits for tasks assessed               Exercises  General Comments        Pertinent Vitals/Pain Pain Assessment: Faces Faces Pain Scale: Hurts a little bit Pain Location: Lt knee Pain Descriptors /  Indicators: Aching;Sore Pain Intervention(s): Monitored during session;Limited activity within patient's tolerance           PT Goals (current goals can now be found in the care plan section) Acute Rehab PT Goals Patient Stated Goal: go home PT Goal Formulation: With patient Time For Goal Achievement: 10/02/19 (extended) Potential to Achieve Goals: Good Progress towards PT goals: Progressing toward goals    Frequency    Min 3X/week      PT Plan Discharge plan needs to be updated    AM-PAC PT "6 Clicks" Mobility   Outcome Measure  Help needed turning from your back to your side while in a flat bed without using bedrails?: None Help needed moving from lying on your back to sitting on the side of a flat bed without using bedrails?: A Little Help needed moving to and from a bed to a chair (including a wheelchair)?: A Little Help needed standing up from a chair using your arms (e.g., wheelchair or bedside chair)?: A Little Help needed to walk in hospital room?: A Little Help needed climbing 3-5 steps with a railing? : A Little 6 Click Score: 19    End of Session Equipment Utilized During Treatment: Gait belt Activity Tolerance: Patient tolerated treatment well Patient left: in bed;with call bell/phone within reach Nurse Communication: Mobility status PT Visit Diagnosis: Muscle weakness (generalized) (M62.81);Difficulty in walking, not elsewhere classified (R26.2);Pain Pain - Right/Left: Left Pain - part of body: Knee     Time: 6122-4497 PT Time Calculation (min) (ACUTE ONLY): 16 min  Charges:  $Gait Training: 8-22 mins                     Verner Mould, DPT Acute Rehabilitation Services  Office [phone] Pager [phone]  09/25/2019 1:04 PM

## 2019-09-25 NOTE — Progress Notes (Signed)
PROGRESS NOTE    Jeffery [name]   ZSW:[id number]  DOB: Dec 20, 1960  PCP: [name], MD    DOA: 09/07/2019 LOS: 18   Brief Narrative   59 y.o.malewith medical history significant ofObesity, DM2, HTN, P Afib, EtOH use, Charcot's foot, Chronic Pain syndrome, Hx of Cardioversion, diastolic CHF, chronic LE blister and edema s/p excision by podiatry comes with worsening of left lower ext foot pain.  Admitted for sepsis secondary to purulent left lower extremity cellulitis.  Hospital course complicated by AKI.  MRI left leg suggested extensive cellulitis with moderate left knee effusion.  Orthopedic consulted who aspirated left knee fluid suggestive of inflammatory fluid.  Nephrology continues to follow.   Assessment & Plan   Acute Kidney Injury on CKD Stage IIIa/mild hyperkalemia Baseline creatinine 1.6.  Patient was seen by nephrology.  Patient was placed on IV diuretics.  Changed over to oral furosemide. Renal ultrasound was unremarkable.  Echocardiogram showed normal systolic function.  Etiology of renal disease is not known.  Biopsy was offered by nephrology and patient is agreeable.  Eliquis has been stopped.  IR has been consulted.  Patient continues to have good urine output.  Creatinine has climbed up to 4.91 today.  Potassium is normal.  Sodium 134.   Sepsis secondary to left Lower extremity Cellulitis with some purulent; POA B/L lower extremity chronic skin changes Sepsis physiology improved. MRI-left lower extremity cellulitis but no evidence of abscess/osteomyelitis Ultrasound ABI-difficult to obtain due to blisters Blood cultures negative. Patient seen by podiatry, Dr. Jacqualyn Posey.  Outpatient follow-up with him after discharge. He was initially on vancomycin which was changed over to Zyvox due to his renal issues.  Patient completed about 12 days of treatment.  Zyvox had to be discontinued due to significant nausea.   Patient feels that his legs have improved.  Outpatient follow-up with podiatry.     Volume Overload  Most likely secondary to progression of renal disease.  Echocardiogram showed normal systolic function.  No evidence for pulmonary hypertension noted.  Patient was also seen by cardiology.  See above.  Nausea and vomiting Thought to be either due to Zyvox or constipation.  Zyvox was discontinued.  Symptoms have improved.  Constipation Due to pain medications and lack of mobility.  Had a large BM yesterday.  TSH was 7.37.  Free T4 1.0.  Left shoulder and left ankle pain, improved Acute mild gout flare Moderate left knee effusion-inflammatory versus infectious X-ray showed chronic changes.   MRI showed soft tissue inflammation concerning for cellulitis with moderate left knee effusion. -Seen by Ortho, s/p aspiration 6/14 suggestive of inflammatory fluid.    He also received colchicine. Doing much better.  Able to move his knee better than before.  Looking forward to working with physical therapy.  Outpatient follow-up with orthopedics, Dr. Erlinda Hong  Sinus bradycardia Continue amiodarone.  Metoprolol was discontinued.  Heart rate is stable.  Essential Hypertension Remains on amlodipine.  Hydralazine as needed.  Blood pressure is reasonably well controlled.  Also on diuretics.  Metoprolol had to be held due to bradycardia.   DM2 with neuropathy, controlled with hypoglycemia HbA1c 8.0.  He remains on Lantus and SSI.  Hypoglycemia on 6/26.  Dose of Lantus was decreased.  He likely has lower insulin requirements due to worsening renal function.  CBGs have been stable.  Continue to monitor.   ParoxysmalA Fib, s/p Cardioversion -On  amiodarone and Eliquis.  LFTs are normal.  Eliquis to be held for renal biopsy. TSH noted to  be 7.37 with a normal free T4.  Recommend rechecking thyroid function tests in a few weeks.  Anemia likely due to chronic kidney disease Hemoglobin low but stable.  No overt bleeding.    Morbid  Obesity Estimated body mass index is 45.25 kg/m as calculated from the following:   Height as of this encounter: 6' 2.02" (1.88 m).   Weight as of this encounter: 159.9 kg.  PT-recommending SNF. Patient declines.  He wants to go home with home health.  DVT prophylaxis: Has been on Eliquis which is on hold now for renal biopsy   Diet:  Diet Orders (From admission, onward)    Start     Ordered   09/26/19 0001  Diet NPO time specified Except for: Sips with Meds  Diet effective midnight       Question:  Except for  Answer:  Sips with Meds   09/24/19 1249   09/17/19 1559  Diet Carb Modified Fluid consistency: Thin; Room service appropriate? Yes; Fluid restriction: 1500 mL Fluid  Diet effective now       Question Answer Comment  Diet-HS Snack? Nothing   Calorie Level Medium 1600-2000   Fluid consistency: Thin   Room service appropriate? Yes   Fluid restriction: 1500 mL Fluid      09/17/19 1558            Code Status: Full Code    Subjective    Patient states that he is feeling better.  His legs are better.  He understands the need for kidney biopsy.  Had a large bowel movement yesterday.  Feels better.  Disposition Plan & Communication   Status is: Inpatient  Remains inpatient appropriate because:IV treatments appropriate due to intensity of illness or inability to take PO   Dispo:  Patient From: Home  Planned Disposition: Home  Expected discharge date: 09/27/19  Medically stable for discharge: No    Family Communication: Discussed in detail with patient.   Consults, Procedures, Significant Events   Consultants:   Nephrology  Cardiology   Antimicrobials:   Zyvox stopped on 6/23   Objective   Vitals:   09/24/19 0523 09/24/19 1353 09/24/19 1953 09/25/19 0536  BP: (!) 154/75 (!) 149/75 (!) 142/69 (!) 145/83  Pulse: 86 88 87 89  Resp: 20 18 20 20   Temp: 98.1 F (36.7 C)  98.1 F (36.7 C) (!) 97.5 F (36.4 C)  TempSrc: Oral  Oral Oral  SpO2: 96%  95% 94% 99%  Weight: (!) 159.9 kg     Height:        Intake/Output Summary (Last 24 hours) at 09/25/2019 1120 Last data filed at 09/25/2019 1118 Gross per 24 hour  Intake 1032 ml  Output 1850 ml  Net -818 ml   Filed Weights   09/22/19 0506 09/23/19 0602 09/24/19 0523  Weight: (!) 158.7 kg (!) 159.4 kg (!) 159.9 kg    Physical Exam:  General appearance: Awake alert.  In no distress Resp: Clear to auscultation bilaterally.  Normal effort Cardio: S1-S2 is normal regular.  No S3-S4.  No rubs murmurs or bruit GI: Abdomen is soft.  Nontender nondistended.  Bowel sounds are present normal.  No masses organomegaly Neurologic: Alert and oriented x3.  No focal neurological deficits.      Labs   Data Reviewed: I have personally reviewed following labs and imaging studies  CBC: Recent Labs  Lab 09/20/19 0404 09/21/19 0348 09/22/19 0335 09/24/19 0439 09/25/19 0442  WBC 11.0* 13.2* 10.4 10.7* 10.2  HGB 8.7* 8.9* 8.7* 7.8* 7.6*  HCT 28.8* 29.7* 28.8* 25.9* 25.0*  MCV 88.3 89.7 89.4 88.1 88.0  PLT 321 259 223 164 320*   Basic Metabolic Panel: Recent Labs  Lab 09/21/19 0348 09/22/19 0335 09/23/19 0422 09/24/19 0439 09/25/19 0442  NA 137 136 132* 131* 134*  K 5.3* 4.8 4.6 4.8 4.8  CL 98 99 95* 96* 100  CO2 26 25 25 24 22   GLUCOSE 82 83 66* 140* 100*  BUN 64* 65* 68* 77* 75*  CREATININE 4.14* 4.00* 4.24* 4.39* 4.91*  CALCIUM 8.9 9.2 8.6* 8.8* 8.6*  PHOS  --  5.6* 5.9* 5.3* 4.6   GFR: Estimated Creatinine Clearance: 26.6 mL/min (A) (by C-G formula based on SCr of 4.91 mg/dL (H)). CBG: Recent Labs  Lab 09/24/19 0752 09/24/19 1158 09/24/19 1715 09/24/19 2057 09/25/19 0808  GLUCAP 156* 111* 170* 190* 134*       Imaging Studies   No results found.   Medications   Scheduled Meds: . amiodarone  200 mg Oral BID  . amLODipine  10 mg Oral Daily  . atorvastatin  20 mg Oral Daily  . darbepoetin (ARANESP) injection - NON-DIALYSIS  100 mcg Subcutaneous Q  Fri-1800  . furosemide  80 mg Oral BID  . gabapentin  300 mg Oral BID  . hydrALAZINE  50 mg Oral Q8H  . insulin aspart  0-15 Units Subcutaneous TID WC  . insulin aspart  0-5 Units Subcutaneous QHS  . insulin glargine  30 Units Subcutaneous BID  . polyethylene glycol  17 g Oral BID  . senna-docusate  2 tablet Oral BID  . sodium phosphate  1 enema Rectal Once   Continuous Infusions:     LOS: 18 days      Bonnielee Haff, [facility]  09/25/2019, 11:20 AM    If 7PM-7AM, please contact night-coverage. How to contact the [facility] Attending or Consulting provider [place] or covering provider during after hours [place], for this patient?    1. Check the care team in [facility] and look for a) attending/consulting TRH provider listed and b) the [facility] team listed 2. Log into [email or web address] and use [place]'s universal password to access. If you do not have the password, please contact the hospital operator. 3. Locate the [facility] provider you are looking for under [facility] and page to a number that you can be directly reached. 4. If you still have difficulty reaching the provider, please page the [facility] (Director on Call) for the Hospitalists listed on amion for assistance.

## 2019-09-25 NOTE — Progress Notes (Addendum)
[facility] Progress Note  Subjective: 2 L UOP yest, creat up 4.9 today, BUN 75, pt w/o new c/o's. No N/V  Vitals:   09/24/19 1353 09/24/19 1953 09/25/19 0536 09/25/19 1432  BP: (!) 149/75 (!) 142/69 (!) 145/83 (!) 168/72  Pulse: 88 87 89 96  Resp: _0 Temp:  98.1 F (36.7 C) (!) 97.5 F (36.4 C) 98.1 F (36.7 C)  TempSrc:  Oral Oral Oral  SpO2: 95% 94% 99% 97%  Weight:      Height:        Exam: Gen alert, no distress No jvd or bruits Chest clear bilat to bases  RRR no MRG Abd soft ntnd no mass or ascites +bs GU normal male Ext  edema but better Neuro is alert, Ox 3 , nf    Date              Creat               eGFR  2012- 18         0.69- 0.90            2019- 2020     0.96- 1.13 May 2019         0.98  May 2021       1.10-  2.31       40- >60 ml/min  September 04, 2019  1.63                 46 Since then worsening - has been 3.3 to 3.8 since 6/12-  Not a clear trend              UA Oct 2018 - negative   UA 08/08/19 - cloudy, 100 prot, few bact, 0-5 rbc, 11-20 wbc   UA 09/10/19 - hazy, large Hb, 30 prot, >50 rbc, 11-20 wbc    Renal US 6/12 - 11.3/ 12.0 cm kidneys, limited exam d/t body habitus, no hydro, relative preservation of cortical thickness   ECHO 02/2019 - LVEF 55-60%, +LVH, mild reduced RV fxn, no valve issues    I/O since admit are 11.2L in and 15.8 L out  = - 4.5 L net    Wt's are down 168kg > 163kd today = - 5kg   BP's have been high since admission   UP/C ratio = 0.8   Assessment/ Plan: 1. AoCKD 3 - baseline creat 1.6, eGFR 46 ml/min. Baseline CKD likely due to DM/ HTN.  Here creat 2.5 >> 3.8, in setting of marked vol overload, hx of RV dysfunction by echo, normal LVEF. UP/C ratio 0.8. Possible cardiorenal element.  UA +wbc/ rbc, repeat shows same.  Recovery is stalled for some reason- crt 3.2 to 3.9 without true trend for several days but now trending worse-  Is it because BP is better controlled ?  Possibly some relative  hypotension for him ? With hematuria checked serologies for completeness sake which were negative.   No dialysis needs yet, non oliguric. Will hold po lasix for now. Plan is for kidney biopsy tomorrow per IR. Pt is aware and agrees to proceed, questions answered.  2. Vol overload - as above- Still has a significant edema but much better than admission. Down in weight 20 lbs. Needs to restrict fluid intake here. CXR negative yest.  3. HTN - BP's have been high since arrival high, ^'d norvasc 5 bid, added hydralazine - now at 50 tid. Avoid acei /ARB for now.  4. DM2 on insulin 5. Atrial fibrillation - on amio, eliquis 6. LLE cellulitis - sp vanc, on linezolid  7. Morbid obesity 8. R HF - by last echo 02/2019 9. Hyperkalemia-  better 10. Nausea-  Thinking it is the zyvox vs constipation-  zyvox stopped working on constipation- is better 11. Anemia  -  In the 8's-   iron stores OK -  giving ESA    Kelly Splinter, MD 09/25/2019, 4:41 PM       Recent Labs  Lab 09/24/19 0439 09/25/19 0442  K 4.8 4.8  BUN 77* 75*  CREATININE 4.39* 4.91*  CALCIUM 8.8* 8.6*  PHOS 5.3* 4.6  HGB 7.8* 7.6*   Inpatient medications: . amiodarone  200 mg Oral BID  . amLODipine  10 mg Oral Daily  . atorvastatin  20 mg Oral Daily  . darbepoetin (ARANESP) injection - NON-DIALYSIS  100 mcg Subcutaneous Q Fri-1800  . furosemide  80 mg Oral BID  . gabapentin  300 mg Oral BID  . hydrALAZINE  50 mg Oral Q8H  . insulin aspart  0-15 Units Subcutaneous TID WC  . insulin aspart  0-5 Units Subcutaneous QHS  . insulin glargine  30 Units Subcutaneous BID  . polyethylene glycol  17 g Oral BID  . senna-docusate  2 tablet Oral BID  . sodium phosphate  1 enema Rectal Once    acetaminophen **OR** acetaminophen, bisacodyl, labetalol, morphine injection, ondansetron **OR** ondansetron (ZOFRAN) IV, oxyCODONE-acetaminophen **AND** oxyCODONE

## 2019-09-25 NOTE — TOC Progression Note (Addendum)
Transition of Care [facility]) - Progression Note    Patient Details  Name: Jeffery [name] MRN: [id number] Date of Birth: Aug 09, 1960  Transition of [facility]) CM/SW Contact  Ross Ludwig, [place] Phone Number: 09/25/2019, 5:04 PM  Clinical Narrative:     Per PT, patient does not want to go to SNF now.  He ambulated 300 feet, PT is recommending home health.  CSW to attempt to make arrangements for Home Health PT.  Expected Discharge Plan:  (Rehab v [facility]) Barriers to Discharge: Other (comment) (Patient not willing to make decison yet about disposition)  Expected Discharge Plan and Services Expected Discharge Plan:  (Rehab v [facility])                                               Social Determinants of Health (SDOH) Interventions    Readmission Risk Interventions No flowsheet data found.

## 2019-09-26 ENCOUNTER — Inpatient Hospital Stay (HOSPITAL_COMMUNITY): Payer: Medicaid Other

## 2019-09-26 LAB — RENAL FUNCTION PANEL
Albumin: 2.5 g/dL — ABNORMAL LOW (ref 3.5–5.0)
Anion gap: 9 (ref 5–15)
BUN: 72 mg/dL — ABNORMAL HIGH (ref 6–20)
CO2: 25 mmol/L (ref 22–32)
Calcium: 8.5 mg/dL — ABNORMAL LOW (ref 8.9–10.3)
Chloride: 99 mmol/L (ref 98–111)
Creatinine, Ser: 4.76 mg/dL — ABNORMAL HIGH (ref 0.61–1.24)
GFR calc Af Amer: 15 mL/min — ABNORMAL LOW (ref >60)
GFR calc non Af Amer: 13 mL/min — ABNORMAL LOW (ref >60)
Glucose, Bld: 73 mg/dL (ref 70–99)
Phosphorus: 4.7 mg/dL — ABNORMAL HIGH (ref 2.5–4.6)
Potassium: 4.4 mmol/L (ref 3.5–5.1)
Sodium: 133 mmol/L — ABNORMAL LOW (ref 135–145)

## 2019-09-26 LAB — CBC
HCT: 24 % — ABNORMAL LOW (ref 39.0–52.0)
Hemoglobin: 7.3 g/dL — ABNORMAL LOW (ref 13.0–17.0)
MCH: 26.6 pg (ref 26.0–34.0)
MCHC: 30.4 g/dL (ref 30.0–36.0)
MCV: 87.6 fL (ref 80.0–100.0)
Platelets: 125 10*3/uL — ABNORMAL LOW (ref 150–400)
RBC: 2.74 MIL/uL — ABNORMAL LOW (ref 4.22–5.81)
RDW: 16.7 % — ABNORMAL HIGH (ref 11.5–15.5)
WBC: 10.1 10*3/uL (ref 4.0–10.5)
nRBC: 0 % (ref 0.0–0.2)

## 2019-09-26 LAB — GLUCOSE, CAPILLARY
Glucose-Capillary: 60 mg/dL — ABNORMAL LOW (ref 70–99)
Glucose-Capillary: 93 mg/dL (ref 70–99)
Glucose-Capillary: 69 mg/dL — ABNORMAL LOW (ref 70–99)
Glucose-Capillary: 102 mg/dL — ABNORMAL HIGH (ref 70–99)
Glucose-Capillary: 127 mg/dL — ABNORMAL HIGH (ref 70–99)

## 2019-09-26 MED ORDER — FENTANYL CITRATE (PF) 100 MCG/2ML IJ SOLN
INTRAMUSCULAR | Status: AC | PRN
  Administered 2019-09-26 (×2): 50 ug via INTRAVENOUS

## 2019-09-26 MED ORDER — GELATIN ABSORBABLE 12-7 MM EX MISC
CUTANEOUS | Status: AC
  Filled 2019-09-26: qty 1

## 2019-09-26 MED ORDER — MIDAZOLAM HCL 2 MG/2ML IJ SOLN
INTRAMUSCULAR | Status: AC
  Filled 2019-09-26: qty 4

## 2019-09-26 MED ORDER — LIDOCAINE HCL 1 % IJ SOLN
INTRAMUSCULAR | Status: AC
  Filled 2019-09-26: qty 20

## 2019-09-26 MED ORDER — FENTANYL CITRATE (PF) 100 MCG/2ML IJ SOLN
INTRAMUSCULAR | Status: AC
  Filled 2019-09-26: qty 2

## 2019-09-26 MED ORDER — INSULIN ASPART 100 UNIT/ML ~~LOC~~ SOLN
0.0000 [IU] | Freq: Three times a day (TID) | SUBCUTANEOUS | Status: DC
  Administered 2019-09-27 (×3): 1 [IU] via SUBCUTANEOUS

## 2019-09-26 MED ORDER — INSULIN GLARGINE 100 UNIT/ML ~~LOC~~ SOLN
25.0000 [IU] | Freq: Two times a day (BID) | SUBCUTANEOUS | Status: DC
  Administered 2019-09-26 – 2019-09-28 (×4): 25 [IU] via SUBCUTANEOUS
  Filled 2019-09-26 (×5): qty 0.25

## 2019-09-26 MED ORDER — APIXABAN 5 MG PO TABS
5.0000 mg | ORAL_TABLET | Freq: Two times a day (BID) | ORAL | Status: DC
  Administered 2019-09-27 – 2019-09-28 (×3): 5 mg via ORAL
  Filled 2019-09-26 (×3): qty 1

## 2019-09-26 MED ORDER — MIDAZOLAM HCL 2 MG/2ML IJ SOLN
INTRAMUSCULAR | Status: AC | PRN
  Administered 2019-09-26 (×2): 1 mg via INTRAVENOUS

## 2019-09-26 MED ORDER — LIDOCAINE HCL (PF) 1 % IJ SOLN
INTRAMUSCULAR | Status: AC | PRN
  Administered 2019-09-26: 10 mL

## 2019-09-26 NOTE — Progress Notes (Signed)
[facility] Progress Note  Subjective: creat down to 4.7 today, I/O - 1L yest on po lasix 80 bid, no new c/o, awaiting kidney biopsy  Vitals:   09/25/19 1432 09/25/19 2107 09/26/19 0500 09/26/19 0525  BP: (!) 168/72 (!) 157/70  140/82  Pulse: 96 91  81  Resp: 19 19  20   Temp: 98.1 F (36.7 C) 98.3 F (36.8 C)  97.8 F (36.6 C)  TempSrc: Oral     SpO2: 97% 94%  96%  Weight:   (!) 159.6 kg   Height:        Exam: Gen alert, no distress No jvd or bruits Chest clear bilat to bases  RRR no MRG Abd very obese, distended, no mass or ascites +bs GU normal male Ext bilat LE/ UE 1-2+, improved Neuro is alert, Ox 3 , nf    Date              Creat               eGFR  2012- 18         0.69- 0.90            2019- 2020     0.96- 1.13 May 2019         0.98  May 2021       1.10-  2.31       40- >60 ml/min  September 04, 2019  1.63                 46 Since then worsening - has been 3.3 to 3.8 since 6/12-  Not a clear trend              UA Oct 2018 - negative   UA 08/08/19 - cloudy, 100 prot, few bact, 0-5 rbc, 11-20 wbc   UA 09/10/19 - hazy, large Hb, 30 prot, >50 rbc, 11-20 wbc    Renal US 6/12 - 11.3/ 12.0 cm kidneys, limited exam d/t body habitus, no hydro, relative preservation of cortical thickness   ECHO 02/2019 - LVEF 55-60%, +LVH, mild reduced RV fxn, no valve issues    I/O since admit are 11.2L in and 15.8 L out  = - 4.5 L net    Wt's down 9kg from admit wts   BP's high since admission   UP/C ratio = 0.8   Anti-GBM neg, ANA neg,  MPO/ pr-3 neg  Assessment/ Plan: 1. AoCKD 3 - baseline creat 1.6, eGFR 46 ml/min. Baseline CKD likely due to DM/ HTN.  Here creat 2.5 >> 3.8, in setting of marked vol overload, hx of RV dysfunction by echo, normal LVEF. UP/C ratio 0.8. Possible cardiorenal element.  UA +wbc/ rbc, repeat shows same.  Recovery is stalled for some reason- crt 3.2 to 3.9 without true trend for several days but now trending worse up into 4.5- 4.9 range.   Maybe because BP is better controlled ?  Possibly some relative hypotension for him ? With hematuria we checked some serologies for completeness sake which were negative.   No dialysis needs yet, non oliguric. Plan is for kidney biopsy today per IR. Will follow.  2. Vol overload - as above- down 20 lbs in weight, still has vol overload. Needs to restrict fluid intake here. CXR negative 6/18.  3. HTN - BP's have been high since arrival, getting norvasc 10 qd and hydralazine 50 tid. Getting po lasix 80 bid. Avoid acei /ARB for now.  4. DM2 on  insulin 5. Atrial fibrillation - on amio, eliquis 6. LLE cellulitis - sp vanc, on linezolid  7. Morbid obesity 8. R HF - by last echo 02/2019 9. Anemia  -  In the 8's-   iron stores OK -  giving ESA    Kelly Splinter, MD 09/26/2019, 11:56 AM       Recent Labs  Lab 09/25/19 0442 09/26/19 0351  K 4.8 4.4  BUN 75* 72*  CREATININE 4.91* 4.76*  CALCIUM 8.6* 8.5*  PHOS 4.6 4.7*  HGB 7.6* 7.3*   Inpatient medications: . gelatin adsorbable      . lidocaine      . amiodarone  200 mg Oral BID  . amLODipine  10 mg Oral Daily  . atorvastatin  20 mg Oral Daily  . darbepoetin (ARANESP) injection - NON-DIALYSIS  100 mcg Subcutaneous Q Fri-1800  . furosemide  80 mg Oral BID  . gabapentin  300 mg Oral BID  . hydrALAZINE  50 mg Oral Q8H  . insulin aspart  0-9 Units Subcutaneous TID WC  . insulin glargine  25 Units Subcutaneous BID  . polyethylene glycol  17 g Oral BID  . senna-docusate  2 tablet Oral BID    acetaminophen **OR** acetaminophen, bisacodyl, labetalol, morphine injection, ondansetron **OR** ondansetron (ZOFRAN) IV, oxyCODONE-acetaminophen **AND** oxyCODONE

## 2019-09-26 NOTE — Procedures (Signed)
Interventional Radiology Procedure Note  Procedure:  US LEFT RENAL BX  Complications: None  Estimated Blood Loss: MIN  Findings: 16 G CORES X 2      

## 2019-09-26 NOTE — Progress Notes (Addendum)
PROGRESS NOTE    Jeffery [name]   KNL:[id number]  DOB: 12/27/1960  PCP: [name], MD    DOA: 09/07/2019 LOS: 19   Brief Narrative   59 y.o.malewith medical history significant ofObesity, DM2, HTN, P Afib, EtOH use, Charcot's foot, Chronic Pain syndrome, Hx of Cardioversion, diastolic CHF, chronic LE blister and edema s/p excision by podiatry comes with worsening of left lower ext foot pain.  Admitted for sepsis secondary to purulent left lower extremity cellulitis.  Hospital course complicated by AKI.  MRI left leg suggested extensive cellulitis with moderate left knee effusion.  Orthopedic consulted who aspirated left knee fluid suggestive of inflammatory fluid.  Nephrology continues to follow.   Assessment & Plan   Acute Kidney Injury on CKD Stage IIIa/mild hyperkalemia Baseline creatinine 1.6.  Patient was seen by nephrology.  Patient was placed on IV diuretics.   Renal ultrasound was unremarkable.  Echocardiogram showed normal systolic function.  Etiology of renal disease is not known.  Biopsy was offered by nephrology and patient is agreeable.  Eliquis has been stopped.  IR has been consulted.  Plan is for renal biopsy sometime today. Patient continues to have good urine output.  His diuretics were changed over to oral.  Nephrology continues to follow.  Creatinine stable today compared to yesterday..    Sepsis secondary to left Lower extremity Cellulitis with some purulent; POA B/L lower extremity chronic skin changes Sepsis physiology improved. MRI-left lower extremity cellulitis but no evidence of abscess/osteomyelitis Ultrasound ABI-difficult to obtain due to blisters Blood cultures negative. Patient seen by podiatry, Dr. Jacqualyn Posey.  Outpatient follow-up with him after discharge. He was initially on vancomycin which was changed over to Zyvox due to his renal issues.  Patient completed about 12 days of treatment.  Zyvox had to be discontinued due to significant  nausea.   Patient feels that his legs have improved. Outpatient follow-up with podiatry.     Volume Overload  Most likely secondary to progression of renal disease.  Echocardiogram showed normal systolic function.  No evidence for pulmonary hypertension noted.  Patient was also seen by cardiology.  See above.  Nausea and vomiting, resolved Thought to be either due to Zyvox or constipation.  Zyvox was discontinued.  Symptoms have improved.  Constipation Due to pain medications and lack of mobility.  He was aggressively treated with laxatives and enemas.  He has had 2 large bowel movements in the last 2 days. TSH was 7.37.  Free T4 1.0.  Left shoulder and left ankle pain, improved Acute mild gout flare Moderate left knee effusion-inflammatory versus infectious X-ray showed chronic changes.   MRI showed soft tissue inflammation concerning for cellulitis with moderate left knee effusion. Seen by Ortho, s/p aspiration 6/14 suggestive of inflammatory fluid.  He also received colchicine. Doing much better.  Able to move his knee better than before.  States that he will do fine at home. Outpatient follow-up with orthopedics, Dr. Erlinda Hong  Sinus bradycardia Continue amiodarone.  Metoprolol was discontinued.  Heart rate is stable.  Essential Hypertension Remains on amlodipine.  Hydralazine as needed.  Blood pressure is reasonably well controlled.  Also on diuretics.  Metoprolol had to be held due to bradycardia.   DM2 with neuropathy, controlled with hypoglycemia HbA1c 8.0.  He remains on Lantus and SSI.  He had hypoglycemia on 6/26.  Dose of Lantus was decreased.  Glucose levels again noted to be low this morning.  Likely because he has been n.p.o. for his renal biopsy.  However he has had lower insulin requirements due to worsening renal function.  We will further decrease the dose of his Lantus.   ParoxysmalA Fib, s/p Cardioversion On  amiodarone and Eliquis.  LFTs are normal.  Eliquis held  for renal biopsy.  Will need to know from IR when it can be resumed.  Abnormal thyroid function tests TSH noted to be 7.37 with a normal free T4.  Recommend rechecking thyroid function tests in a few weeks.  Anemia likely due to chronic kidney disease Hemoglobin low but stable.  No overt bleeding.    Morbid Obesity Estimated body mass index is 45.16 kg/m as calculated from the following:   Height as of this encounter: 6' 2.02" (1.88 m).   Weight as of this encounter: 159.6 kg.  PT-recommending SNF. Patient declines.  He wants to go home with home health.  DVT prophylaxis: Has been on Eliquis which is on hold now for renal biopsy   Diet:  Diet Orders (From admission, onward)    Start     Ordered   09/26/19 0001  Diet NPO time specified Except for: Sips with Meds  Diet effective midnight       Question:  Except for  Answer:  Ferrel Logan with Meds   09/24/19 1249            Code Status: Full Code    Subjective    Overall patient states that he is doing well.  Feeling better from his legs perspective.  He has had a bowel movement.  Waiting on his kidney biopsy.  Looking forward to going home in the next few days.  Disposition Plan & Communication   Status is: Inpatient  Remains inpatient appropriate because:Inpatient level of care appropriate due to severity of illness and Renal biopsy today   Dispo:  Patient From: Home  Planned Disposition: Home  Expected discharge date: 09/28/19  Medically stable for discharge: No     Family Communication: Discussed in detail with patient.   Consults, Procedures, Significant Events   Consultants:   Nephrology  Cardiology   Antimicrobials:   Zyvox stopped on 6/23   Objective   Vitals:   09/25/19 1432 09/25/19 2107 09/26/19 0500 09/26/19 0525  BP: (!) 168/72 (!) 157/70  140/82  Pulse: 96 91  81  Resp: 19 19  20   Temp: 98.1 F (36.7 C) 98.3 F (36.8 C)  97.8 F (36.6 C)  TempSrc: Oral     SpO2: 97% 94%  96%   Weight:   (!) 159.6 kg   Height:        Intake/Output Summary (Last 24 hours) at 09/26/2019 1034 Last data filed at 09/26/2019 0500 Gross per 24 hour  Intake 355 ml  Output 1775 ml  Net -1420 ml   Filed Weights   09/23/19 0602 09/24/19 0523 09/26/19 0500  Weight: (!) 159.4 kg (!) 159.9 kg (!) 159.6 kg    Physical Exam:  General appearance: Awake alert.  In no distress.  Obese Resp: Clear to auscultation bilaterally.  Normal effort Cardio: S1-S2 is normal regular.  No S3-S4.  No rubs murmurs or bruit GI: Abdomen is soft.  Nontender nondistended.  Bowel sounds are present normal.  No masses organomegaly Extremities: Both legs covered in dressing.  Edema seems to be better. Neurologic: Alert and oriented x3.  No focal neurological deficits.      Labs   Data Reviewed: I have personally reviewed following labs and imaging studies  CBC: Recent Labs  Lab  09/21/19 0348 09/22/19 0335 09/24/19 0439 09/25/19 0442 09/26/19 0351  WBC 13.2* 10.4 10.7* 10.2 10.1  HGB 8.9* 8.7* 7.8* 7.6* 7.3*  HCT 29.7* 28.8* 25.9* 25.0* 24.0*  MCV 89.7 89.4 88.1 88.0 87.6  PLT 259 223 164 136* 741*   Basic Metabolic Panel: Recent Labs  Lab 09/22/19 0335 09/23/19 0422 09/24/19 0439 09/25/19 0442 09/26/19 0351  NA 136 132* 131* 134* 133*  K 4.8 4.6 4.8 4.8 4.4  CL 99 95* 96* 100 99  CO2 25 25 24 22 25   GLUCOSE 83 66* 140* 100* 73  BUN 65* 68* 77* 75* 72*  CREATININE 4.00* 4.24* 4.39* 4.91* 4.76*  CALCIUM 9.2 8.6* 8.8* 8.6* 8.5*  PHOS 5.6* 5.9* 5.3* 4.6 4.7*   GFR: Estimated Creatinine Clearance: 27.4 mL/min (A) (by C-G formula based on SCr of 4.76 mg/dL (H)). CBG: Recent Labs  Lab 09/25/19 1151 09/25/19 1614 09/25/19 2110 09/26/19 0854 09/26/19 1014  GLUCAP 111* 139* 108* 60* 93       Imaging Studies   No results found.   Medications   Scheduled Meds: . amiodarone  200 mg Oral BID  . amLODipine  10 mg Oral Daily  . atorvastatin  20 mg Oral Daily  . darbepoetin  (ARANESP) injection - NON-DIALYSIS  100 mcg Subcutaneous Q Fri-1800  . furosemide  80 mg Oral BID  . gabapentin  300 mg Oral BID  . hydrALAZINE  50 mg Oral Q8H  . insulin aspart  0-15 Units Subcutaneous TID WC  . insulin aspart  0-5 Units Subcutaneous QHS  . insulin glargine  30 Units Subcutaneous BID  . polyethylene glycol  17 g Oral BID  . senna-docusate  2 tablet Oral BID  . sodium phosphate  1 enema Rectal Once   Continuous Infusions:     LOS: 19 days      Bonnielee Haff, [facility]  09/26/2019, 10:34 AM    If 7PM-7AM, please contact night-coverage. How to contact the [facility] Attending or Consulting provider [place] or covering provider during after hours [place], for this patient?    1. Check the care team in [facility] and look for a) attending/consulting [facility] provider listed and b) the [facility] team listed 2. Log into [email or web address] and use [place]'s universal password to access. If you do not have the password, please contact the hospital operator. 3. Locate the [facility] provider you are looking for under [facility] and page to a number that you can be directly reached. 4. If you still have difficulty reaching the provider, please page the [facility] (Director on Call) for the Hospitalists listed on amion for assistance.

## 2019-09-27 LAB — RENAL FUNCTION PANEL
Albumin: 2.6 g/dL — ABNORMAL LOW (ref 3.5–5.0)
Anion gap: 9 (ref 5–15)
BUN: 69 mg/dL — ABNORMAL HIGH (ref 6–20)
CO2: 27 mmol/L (ref 22–32)
Calcium: 8.7 mg/dL — ABNORMAL LOW (ref 8.9–10.3)
Chloride: 100 mmol/L (ref 98–111)
Creatinine, Ser: 4.87 mg/dL — ABNORMAL HIGH (ref 0.61–1.24)
GFR calc Af Amer: 14 mL/min — ABNORMAL LOW (ref >60)
GFR calc non Af Amer: 12 mL/min — ABNORMAL LOW (ref >60)
Glucose, Bld: 130 mg/dL — ABNORMAL HIGH (ref 70–99)
Phosphorus: 4.6 mg/dL (ref 2.5–4.6)
Potassium: 4.4 mmol/L (ref 3.5–5.1)
Sodium: 136 mmol/L (ref 135–145)

## 2019-09-27 LAB — GLUCOSE, CAPILLARY
Glucose-Capillary: 125 mg/dL — ABNORMAL HIGH (ref 70–99)
Glucose-Capillary: 132 mg/dL — ABNORMAL HIGH (ref 70–99)
Glucose-Capillary: 139 mg/dL — ABNORMAL HIGH (ref 70–99)
Glucose-Capillary: 147 mg/dL — ABNORMAL HIGH (ref 70–99)

## 2019-09-27 LAB — CBC
HCT: 22.6 % — ABNORMAL LOW (ref 39.0–52.0)
Hemoglobin: 7 g/dL — ABNORMAL LOW (ref 13.0–17.0)
MCH: 26.8 pg (ref 26.0–34.0)
MCHC: 31 g/dL (ref 30.0–36.0)
MCV: 86.6 fL (ref 80.0–100.0)
Platelets: 132 10*3/uL — ABNORMAL LOW (ref 150–400)
RBC: 2.61 MIL/uL — ABNORMAL LOW (ref 4.22–5.81)
RDW: 16.5 % — ABNORMAL HIGH (ref 11.5–15.5)
WBC: 10.7 10*3/uL — ABNORMAL HIGH (ref 4.0–10.5)
nRBC: 0 % (ref 0.0–0.2)

## 2019-09-27 LAB — PREPARE RBC (CROSSMATCH)

## 2019-09-27 LAB — ABO/RH: ABO/RH(D): O NEG

## 2019-09-27 MED ORDER — SODIUM CHLORIDE 0.9% IV SOLUTION: Freq: Once | INTRAVENOUS | Status: AC

## 2019-09-27 NOTE — Progress Notes (Signed)
PROGRESS NOTE    Jeffery [name]  OQH:[id number] DOB: [birth year] DOA: 09/07/2019 PCP: [name], MD   Brief Narrative: Patient is a 59 year old male with history of morbid obesity, diabetes type 2, hypertension, proximal A. fib, ethanol use, chronic pain syndrome, diastolic CHF, chronic lower extremity blisters/edema status post excision by podiatry who presented with worsening left lower extremity foot pain.  He was admitted for sepsis secondary to purulent left lower extremity cellulitis.  Hospital course was complicated by AKI.  MRI of the left leg suggested extensive cellulitis with moderate left knee effusion.  Orthopedics was consulted aspirated the left knee.  Nephrology was following for AKI.  Underwent renal biopsy on 09/26/2019.  Assessment & Plan:   Principal Problem:   Cellulitis of left lower extremity Active Problems:   Charcot foot due to diabetes mellitus ([place])   Essential hypertension   Uncontrolled type 2 diabetes mellitus with diabetic polyneuropathy, without long-term current use of insulin ([facility])   Paroxysmal atrial fibrillation ([facility])   Chronic anticoagulation   AKI (acute kidney injury) ([place])   Left leg cellulitis   Sepsis ([place])   Acute pain of left knee   Venous stasis   AKI on CKD stage IIIa: Baseline creatinine is 1.6.  Renal ultrasound was unremarkable.  Etiology of the renal disease is  not known.  Currently creatinine in the range of 4.  He underwent renal biopsy by IR on 09/26/2019.  Nephrology continues to follow.  Continue oral diuretics  Sepsis secondary to left lower extremity cellulitis: He has chronic bilateral lower extremity chronic skin changes.  Sepsis physiology has improved.  MRI of the left lower extremity showed cellulitis but no evidence of abscess/osteomyelitis.  ABI was difficult to obtain due to blisters.  Blood cultures have been negative.  Seen by podiatry Dr. Jacqualyn Posey who recommended outpatient follow-up.  Patient completed  antibiotics course.  Cellulitis has improved.  Volume overload: Most likely secondary to progression of renal disease.  Echocardiogram showed normal systolic function.  No evidence of pulmonary hypertension.  Patient was also seen by cardiology during this hospitalization.  Continue diuretics at current dose.  Nausea/vomiting: Resolved  Constipation: Continue bowel regimen  Left shoulder/left ankle pain: Most likely from acute mild gout flare.  Currently improved.  X-ray also showed moderate left knee effusion: Inflammatory versus infectious.  X-ray showed chronic changes.  MRI showed soft tissue inflammation concerning for cellulitis with moderate left knee effusion.  He was seen by orthopedics.  Status post aspiration on 6/14.  Started on colchicine.  Doing much better now.  We recommend to follow-up with orthopedics as an outpatient with Dr. Erlinda Hong.  Bradycardia: Metoprolol has been discontinued.  Continue amiodarone.  Currently heart rate is stable.  Hypertension: Currently blood pressure stable.  Continue amlodipine.  Metoprolol held due to bradycardia.  Diabetes type 2 with neuropathy: Hemoglobin A1c of 8.  On Lantus and sliding scale insulin.  Paroxysmal A. fib: Status post cardioversion.  On amiodarone and Eliquis.  Abnormal thyroid function test: Elevated TSH at 7.37 with normal free T4.  We recommend to follow-up as an outpatient for thyroid function test in  few weeks.  Normocytic anemia: Most likely associated with chronic kidney disease.  Hemoglobin dropped to 7 today.  He will be transfused with 1 unit of PRBC.  Morbid obesity: BMI of 45.1           DVT prophylaxis:E;iquis Code Status: Full Family Communication: None Status is: Inpatient  Remains inpatient appropriate because:Ongoing diagnostic testing needed  not appropriate for outpatient work up   Dispo:  Patient From: Home  Planned Disposition: Home  Expected discharge date: 09/28/19  Medically stable for  discharge: No  Needs nephrology clearance before discharge.   Consultants: Nephrology, IR  Procedures: Kidney biopsy  Antimicrobials:  Anti-infectives (From admission, onward)   Start     Dose/Rate Route Frequency Ordered Stop   09/12/19 1300  linezolid (ZYVOX) tablet 600 mg  Status:  Discontinued        600 mg Oral Every 12 hours 09/12/19 1216 09/20/19 0848   09/10/19 1600  vancomycin (VANCOREADY) IVPB 2000 mg/400 mL  Status:  Discontinued        2,000 mg 200 mL/hr over 120 Minutes Intravenous Every 48 hours 09/09/19 0816 09/12/19 1216   09/08/19 1400  vancomycin (VANCOREADY) IVPB 1750 mg/350 mL  Status:  Discontinued        1,750 mg 175 mL/hr over 120 Minutes Intravenous Every 24 hours 09/07/19 1453 09/09/19 0816   09/07/19 1445  vancomycin (VANCOCIN) IVPB 1000 mg/200 mL premix  Status:  Discontinued        1,000 mg 200 mL/hr over 60 Minutes Intravenous  Once 09/07/19 1430 09/07/19 1441   09/07/19 1400  vancomycin (VANCOREADY) IVPB 2000 mg/400 mL        2,000 mg 200 mL/hr over 120 Minutes Intravenous  Once 09/07/19 1345 09/07/19 1733   09/07/19 1400  piperacillin-tazobactam (ZOSYN) IVPB 3.375 g        3.375 g 100 mL/hr over 30 Minutes Intravenous  Once 09/07/19 1345 09/07/19 1456      Subjective: Patient seen and examined at the bedside this afternoon.  Currently hemodynamically stable.  Sitting on the chair.  Comfortable.  Eager to go home.  Objective: Vitals:   09/26/19 1240 09/26/19 1310 09/26/19 2118 09/27/19 0606  BP: 134/69 (!) 152/77 134/68 (!) 149/71  Pulse: 79 72 77 79  Resp: 12 16 20 16   Temp:  97.7 F (36.5 C) (!) 97.5 F (36.4 C) 97.6 F (36.4 C)  TempSrc:  Oral Oral Oral  SpO2: 100% 100% 100% 99%  Weight:      Height:        Intake/Output Summary (Last 24 hours) at 09/27/2019 1148 Last data filed at 09/27/2019 0900 Gross per 24 hour  Intake 1270 ml  Output 1650 ml  Net -380 ml   Filed Weights   09/23/19 0602 09/24/19 0523 09/26/19 0500  Weight:  (!) 159.4 kg (!) 159.9 kg (!) 159.6 kg    Examination:  General exam: Appears calm and comfortable ,Not in distress, morbidly obese  Respiratory system: Bilateral equal air entry, normal vesicular breath sounds, no wheezes or crackles  Cardiovascular system: S1 & S2 heard, RRR. No JVD, murmurs, rubs, gallops or clicks Gastrointestinal system: Abdomen is nondistended, soft and nontender. No organomegaly or masses felt. Normal bowel sounds heard. Central nervous system: Alert and oriented. No focal neurological deficits. Extremities: 3-4+ pitting lower extremity edema, bilateral legs covered with dressing. Skin: No ulcers,no icterus ,no pallor     Data Reviewed: I have personally reviewed following labs and imaging studies  CBC: Recent Labs  Lab 09/22/19 0335 09/24/19 0439 09/25/19 0442 09/26/19 0351 09/27/19 0325  WBC 10.4 10.7* 10.2 10.1 10.7*  HGB 8.7* 7.8* 7.6* 7.3* 7.0*  HCT 28.8* 25.9* 25.0* 24.0* 22.6*  MCV 89.4 88.1 88.0 87.6 86.6  PLT 223 164 136* 125* 696*   Basic Metabolic Panel: Recent Labs  Lab 09/23/19 0422 09/24/19 0439 09/25/19 0442  09/26/19 0351 09/27/19 0325  NA 132* 131* 134* 133* 136  K 4.6 4.8 4.8 4.4 4.4  CL 95* 96* 100 99 100  CO2 25 24 22 25 27   GLUCOSE 66* 140* 100* 73 130*  BUN 68* 77* 75* 72* 69*  CREATININE 4.24* 4.39* 4.91* 4.76* 4.87*  CALCIUM 8.6* 8.8* 8.6* 8.5* 8.7*  PHOS 5.9* 5.3* 4.6 4.7* 4.6   GFR: Estimated Creatinine Clearance: 26.8 mL/min (A) (by C-G formula based on SCr of 4.87 mg/dL (H)). Liver Function Tests: Recent Labs  Lab 09/23/19 0422 09/24/19 0439 09/25/19 0442 09/26/19 0351 09/27/19 0325  ALBUMIN 2.4* 2.6* 2.5* 2.5* 2.6*   No results for input(s): LIPASE, AMYLASE in the last 168 hours. No results for input(s): AMMONIA in the last 168 hours. Coagulation Profile: Recent Labs  Lab 09/25/19 0442  INR 1.8*   Cardiac Enzymes: No results for input(s): CKTOTAL, CKMB, CKMBINDEX, TROPONINI in the last 168  hours. BNP (last 3 results) No results for input(s): PROBNP in the last 8760 hours. HbA1C: No results for input(s): HGBA1C in the last 72 hours. CBG: Recent Labs  Lab 09/26/19 1014 09/26/19 1315 09/26/19 1630 09/26/19 1957 09/27/19 0755  GLUCAP 93 69* 102* 127* 125*   Lipid Profile: No results for input(s): CHOL, HDL, LDLCALC, TRIG, CHOLHDL, LDLDIRECT in the last 72 hours. Thyroid Function Tests: No results for input(s): TSH, T4TOTAL, FREET4, T3FREE, THYROIDAB in the last 72 hours. Anemia Panel: No results for input(s): VITAMINB12, FOLATE, FERRITIN, TIBC, IRON, RETICCTPCT in the last 72 hours. Sepsis Labs: No results for input(s): PROCALCITON, LATICACIDVEN in the last 168 hours.  No results found for this or any previous visit (from the past 240 hour(s)).       Radiology Studies: US BIOPSY (KIDNEY)  Result Date: 09/26/2019 INDICATION: ACUTE KIDNEY INJURY EXAM: ULTRASOUND GUIDED CORE BIOPSY OF LEFT KIDNEY CORTEX MEDICATIONS: 1% lidocaine local ANESTHESIA/SEDATION: Versed 2.0mg  IV; Fentanyl 152mcg IV; Moderate Sedation Time:  10 minutes The patient was continuously monitored during the procedure by the interventional radiology nurse under my direct supervision. FLUOROSCOPY TIME:  Fluoroscopy Time: None. COMPLICATIONS: None immediate. PROCEDURE: The procedure, risks, benefits, and alternatives were explained to the patient. Questions regarding the procedure were encouraged and answered. The patient understands and consents to the procedure. Previous imaging reviewed. Patient position prone. Preliminary ultrasound performed. The left kidney was localized and marked for a posterior approach. Under sterile conditions and local anesthesia, a 15 gauge coaxial guide needle was advanced to the left renal cortex. Needle position confirmed with ultrasound. Images obtained for documentation. Through the access, 2 16 gauge core biopsies obtained of the cortex. Samples were intact and non  fragmented. These were placed in saline. Needle tract occluded with Gel-Foam. Postprocedure imaging demonstrates no large hemorrhage or hematoma. Patient tolerated biopsy well. FINDINGS: Imaging confirms needle placement to the left kidney cortex for core biopsy IMPRESSION: Successful ultrasound left renal cortex 16 gauge core biopsy Electronically Signed   By: Jerilynn Mages.  Shick M.D.   On: 09/26/2019 12:59        Scheduled Meds: . amiodarone  200 mg Oral BID  . amLODipine  10 mg Oral Daily  . apixaban  5 mg Oral BID  . atorvastatin  20 mg Oral Daily  . darbepoetin (ARANESP) injection - NON-DIALYSIS  100 mcg Subcutaneous Q Fri-1800  . furosemide  80 mg Oral BID  . gabapentin  300 mg Oral BID  . hydrALAZINE  50 mg Oral Q8H  . insulin aspart  0-9 Units Subcutaneous  TID WC  . insulin glargine  25 Units Subcutaneous BID  . polyethylene glycol  17 g Oral BID  . senna-docusate  2 tablet Oral BID   Continuous Infusions:   LOS: 20 days    Time spent: 35 mins.More than 50% of that time was spent in counseling and/or coordination of care.      Shelly Coss, MD [facility] P6/30/2021, 11:48 AM

## 2019-09-27 NOTE — Progress Notes (Signed)
Physical Therapy Treatment Patient Details Name: Jeffery [name] MRN: [id number] DOB: 07/01/1960 Today's Date: 09/27/2019    History of Present Illness 59 y.o. male with medical history significant of Obesity, DM2, HTN, P Afib, EtOH use, Charcot's foot, Chronic Pain syndrome, Hx of Cardioversion, diastolic CHF, chronic LE blister and edema s/p right foot wound excision, debridement Acell application preformed on 08/14/2019. Patient presented with Lt shoulder pain, worsening Bil LE edema and blisters, and Lt LE/ankle pain.    PT Comments    Patient continues to progress with mobility and had improved safety awareness with use of RW for gait and improved safety with stair sequencing. He continued training on stairs and negotiated 12 steps (4x3) with bil hand rail; reviewed safe guarding and assistance from family/friends to help with walker management on steps. Patient demonstrated improved balance with standing activity completing self care tasks in bathroom. Acute PT will continue to progress patient as able, recommend HHPT once pt discharges home.     Follow Up Recommendations  Home health PT;Supervision for mobility/OOB     Equipment Recommendations  Rolling walker with 5" wheels;3in1 (PT) (wide)    Recommendations for Other Services       Precautions / Restrictions Precautions Precautions: Fall Restrictions Weight Bearing Restrictions: No    Mobility  Bed Mobility Overal bed mobility: Modified Independent Bed Mobility: Supine to Sit;Sit to Supine     Supine to sit: HOB elevated;Modified independent (Device/Increase time)     General bed mobility comments: HOB slightly elevated, pt requires increased time and is using bed rail.   Transfers Overall transfer level: Needs assistance Equipment used: Rolling walker (2 wheeled) Transfers: Sit to/from Stand Sit to Stand: Supervision;From elevated surface      General transfer comment: bed height elevated, supervision for  safety.  Ambulation/Gait Ambulation/Gait assistance: Min guard Gait Distance (Feet): 80 Feet Assistive device: Rolling walker (2 wheeled) Gait Pattern/deviations: Step-through pattern;Wide base of support;Decreased stride length;Trunk flexed Gait velocity: fair   General Gait Details: pt steady and no LOB noted. cues to maintain safe proximity to RW intermittently and to move slow through turns.   Stairs Stairs: Yes Stairs assistance: Min guard Stair Management: Two rails;Forwards;Step to pattern Number of Stairs: 12 (4x3) General stair comments: cues for safe step pattern and for safe walker management/assist from friend/family. no overt LOB noted. pt moving with improved safety awareness this date.    Wheelchair Mobility    Modified Rankin (Stroke Patients Only)       Balance Overall balance assessment: Needs assistance Sitting-balance support: Feet supported Sitting balance-Leahy Scale: Good     Standing balance support: Bilateral upper extremity supported;During functional activity Standing balance-Leahy Scale: Fair Standing balance comment: pt completed self care in bathroom to brushteeth and shave. intermittently no UE support.              Cognition Arousal/Alertness: Awake/alert Behavior During Therapy: WFL for tasks assessed/performed Overall Cognitive Status: Within Functional Limits for tasks assessed             Exercises      General Comments        Pertinent Vitals/Pain Pain Assessment: No/denies pain           PT Goals (current goals can now be found in the care plan section) Acute Rehab PT Goals Patient Stated Goal: go home PT Goal Formulation: With patient Time For Goal Achievement: 10/02/19 Potential to Achieve Goals: Good Progress towards PT goals: Progressing toward goals    Frequency  Min 3X/week      PT Plan Current plan remains appropriate       AM-PAC PT "6 Clicks" Mobility   Outcome Measure  Help needed  turning from your back to your side while in a flat bed without using bedrails?: None Help needed moving from lying on your back to sitting on the side of a flat bed without using bedrails?: None Help needed moving to and from a bed to a chair (including a wheelchair)?: A Little Help needed standing up from a chair using your arms (e.g., wheelchair or bedside chair)?: A Little Help needed to walk in hospital room?: A Little Help needed climbing 3-5 steps with a railing? : A Little 6 Click Score: 20    End of Session Equipment Utilized During Treatment: Gait belt Activity Tolerance: Patient tolerated treatment well Patient left: in chair;with call bell/phone within reach Nurse Communication: Mobility status PT Visit Diagnosis: Muscle weakness (generalized) (M62.81);Difficulty in walking, not elsewhere classified (R26.2);Pain     Time: 9758-8325 PT Time Calculation (min) (ACUTE ONLY): 23 min  Charges:  $Gait Training: 8-22 mins $Therapeutic Activity: 8-22 mins                     Verner Mould, DPT Acute Rehabilitation Services  Office [phone] Pager [phone]  09/27/2019 5:14 PM

## 2019-09-27 NOTE — Progress Notes (Signed)
[facility] Progress Note  Subjective: creat stable at 4.8 today, BUN 69, pt w/o N/V or SOB.   Vitals:   09/26/19 1240 09/26/19 1310 09/26/19 2118 09/27/19 0606  BP: 134/69 (!) 152/77 134/68 (!) 149/71  Pulse: 79 72 77 79  Resp: 12 16 20 16   Temp:  97.7 F (36.5 C) (!) 97.5 F (36.4 C) 97.6 F (36.4 C)  TempSrc:  Oral Oral Oral  SpO2: 100% 100% 100% 99%  Weight:      Height:        Exam: Gen alert, no distress No jvd or bruits Chest clear bilat to bases  RRR no MRG Abd very obese, distended, no mass or ascites +bs GU normal male Ext bilat LE/ UE 1-2+, improved Neuro is alert, Ox 3 , nf    Date              Creat               eGFR  2012- 18         0.69- 0.90            2019- 2020     0.96- 1.13 May 2019         0.98  May 2021       1.10-  2.31       40- >60 ml/min  September 04, 2019  1.63                 46 Since then worsening - has been 3.3 to 3.8 since 6/12-  Not a clear trend              UA Oct 2018 - negative   UA 08/08/19 - cloudy, 100 prot, few bact, 0-5 rbc, 11-20 wbc   UA 09/10/19 - hazy, large Hb, 30 prot, >50 rbc, 11-20 wbc    Renal US 6/12 - 11.3/ 12.0 cm kidneys, limited exam d/t body habitus, no hydro, relative preservation of cortical thickness   ECHO 02/2019 - LVEF 55-60%, +LVH, mild reduced RV fxn, no valve issues    I/O since admit are 11.2L in and 15.8 L out  = - 4.5 L net    Wt's down 9kg from admit wts   BP's high since admission   UP/C ratio = 0.8   Anti-GBM neg, ANA neg,  MPO/ pr-3 neg  Assessment/ Plan: 1. AoCKD 3 - baseline creat 1.6, eGFR 46 ml/min. Baseline CKD likely due to DM/ HTN.  Here creat 2.5 >> 3.8, in setting of marked vol overload, hx of RV dysfunction by echo, normal LVEF. UP/C ratio 0.8. Possible cardiorenal element.  UA +wbc/ rbc, repeat shows same.  Recovery is stalled for some reason w/ creat plateau around 4.5- 4.9.  Maybe because BP is better controlled ?  Possibly some relative hypotension for him ? With  hematuria we checked some serologies for completeness sake which were negative.  Pt had renal bx yesterday 6/29 to look for cause of acute / chronic renal disease. Continue on po lasix 80 bid for now. Await biopsy results. Will establish office f/u while waiting.  2. Vol overload - as above- down 20 lbs in weight, still has vol overload. Needs to restrict fluid intake here. CXR negative 6/18.  3. HTN - BP's have been high since arrival, getting norvasc 10 qd and hydralazine 50 tid. Getting po lasix 80 bid. Avoid acei /ARB for now.  4. DM2 on insulin 5. Atrial fibrillation -  on amio, eliquis 6. LLE cellulitis - sp vanc, on linezolid  7. Morbid obesity 8. R HF - by last echo 02/2019 9. Anemia  -  In the 8's-   iron stores OK -  giving ESA    Kelly Splinter, MD 09/27/2019, 1:16 PM       Recent Labs  Lab 09/26/19 0351 09/27/19 0325  K 4.4 4.4  BUN 72* 69*  CREATININE 4.76* 4.87*  CALCIUM 8.5* 8.7*  PHOS 4.7* 4.6  HGB 7.3* 7.0*   Inpatient medications: . sodium chloride   Intravenous Once  . amiodarone  200 mg Oral BID  . amLODipine  10 mg Oral Daily  . apixaban  5 mg Oral BID  . atorvastatin  20 mg Oral Daily  . darbepoetin (ARANESP) injection - NON-DIALYSIS  100 mcg Subcutaneous Q Fri-1800  . furosemide  80 mg Oral BID  . gabapentin  300 mg Oral BID  . hydrALAZINE  50 mg Oral Q8H  . insulin aspart  0-9 Units Subcutaneous TID WC  . insulin glargine  25 Units Subcutaneous BID  . polyethylene glycol  17 g Oral BID  . senna-docusate  2 tablet Oral BID    acetaminophen **OR** acetaminophen, bisacodyl, labetalol, morphine injection, ondansetron **OR** ondansetron (ZOFRAN) IV, oxyCODONE-acetaminophen **AND** oxyCODONE

## 2019-09-28 LAB — RENAL FUNCTION PANEL
Albumin: 2.7 g/dL — ABNORMAL LOW (ref 3.5–5.0)
Anion gap: 13 (ref 5–15)
BUN: 66 mg/dL — ABNORMAL HIGH (ref 6–20)
CO2: 24 mmol/L (ref 22–32)
Calcium: 8.5 mg/dL — ABNORMAL LOW (ref 8.9–10.3)
Chloride: 100 mmol/L (ref 98–111)
Creatinine, Ser: 4.96 mg/dL — ABNORMAL HIGH (ref 0.61–1.24)
GFR calc Af Amer: 14 mL/min — ABNORMAL LOW (ref >60)
GFR calc non Af Amer: 12 mL/min — ABNORMAL LOW (ref >60)
Glucose, Bld: 89 mg/dL (ref 70–99)
Phosphorus: 4.6 mg/dL (ref 2.5–4.6)
Potassium: 4.6 mmol/L (ref 3.5–5.1)
Sodium: 137 mmol/L (ref 135–145)

## 2019-09-28 LAB — TYPE AND SCREEN
ABO/RH(D): O NEG
Antibody Screen: NEGATIVE
Unit division: 0

## 2019-09-28 LAB — GLUCOSE, CAPILLARY
Glucose-Capillary: 98 mg/dL (ref 70–99)
Glucose-Capillary: 102 mg/dL — ABNORMAL HIGH (ref 70–99)
Glucose-Capillary: 100 mg/dL — ABNORMAL HIGH (ref 70–99)

## 2019-09-28 LAB — BPAM RBC
Blood Product Expiration Date: 202107242359
ISSUE DATE / TIME: 202106301626
Unit Type and Rh: 9500

## 2019-09-28 LAB — CBC
HCT: 24.7 % — ABNORMAL LOW (ref 39.0–52.0)
Hemoglobin: 7.6 g/dL — ABNORMAL LOW (ref 13.0–17.0)
MCH: 27.1 pg (ref 26.0–34.0)
MCHC: 30.8 g/dL (ref 30.0–36.0)
MCV: 88.2 fL (ref 80.0–100.0)
Platelets: 157 10*3/uL (ref 150–400)
RBC: 2.8 MIL/uL — ABNORMAL LOW (ref 4.22–5.81)
RDW: 16.1 % — ABNORMAL HIGH (ref 11.5–15.5)
WBC: 13.1 10*3/uL — ABNORMAL HIGH (ref 4.0–10.5)
nRBC: 0.2 % (ref 0.0–0.2)

## 2019-09-28 MED ORDER — AMLODIPINE BESYLATE 10 MG PO TABS: 10.0000 mg | ORAL_TABLET | Freq: Every day | ORAL | 1 refills | Status: DC

## 2019-09-28 MED ORDER — FUROSEMIDE 80 MG PO TABS: 80.0000 mg | ORAL_TABLET | Freq: Two times a day (BID) | ORAL | 1 refills | Status: AC

## 2019-09-28 MED ORDER — HYDRALAZINE HCL 50 MG PO TABS: 50.0000 mg | ORAL_TABLET | Freq: Three times a day (TID) | ORAL | 1 refills | Status: DC

## 2019-09-28 MED ORDER — HYDRALAZINE HCL 50 MG PO TABS
100.0000 mg | ORAL_TABLET | Freq: Three times a day (TID) | ORAL | Status: DC
  Administered 2019-09-28: 100 mg via ORAL
  Filled 2019-09-28: qty 2

## 2019-09-28 NOTE — Discharge Summary (Signed)
Physician Discharge Summary  Jeffery [name] PPJ:[id number] DOB: Nov 25, 1960 DOA: 09/07/2019  PCP: [name], MD  Admit date: 09/07/2019 Discharge date: 09/28/2019  Admitted From: Home Disposition:  Home  Discharge Condition:Stable CODE STATUS:FULL Diet recommendation: Heart Healthy   Brief/Interim Summary:  Patient is a 59 year old male with history of morbid obesity, diabetes type 2, hypertension, proximal A. fib, ethanol use, chronic pain syndrome, diastolic CHF, chronic lower extremity blisters/edema status post excision by podiatry who presented with worsening left lower extremity foot pain.  He was admitted for sepsis secondary to purulent left lower extremity cellulitis.  Hospital course was complicated by AKI.  MRI of the left leg suggested extensive cellulitis with moderate left knee effusion.  Orthopedics was consulted aspirated the left knee.  Nephrology was following for AKI.  Underwent renal biopsy on 09/26/2019.  Renal biopsy report is still pending.  Nephrology cleared for discharge with plan for outpatient follow-up.  He is hemodynamically stable for discharge home today.  Following problems were addressed during his hospitalization:  AKI on CKD stage IIIa: Baseline creatinine is 1.6.  Renal ultrasound was unremarkable.  Etiology of the renal disease is  not known.  Currently creatinine in the range of 4.  He underwent renal biopsy by IR on 09/26/2019.    Nephrology will follow up his renal biopsy report, outpatient follow-up as already been scheduled. Continue oral diuretics  Sepsis secondary to left lower extremity cellulitis: He has chronic bilateral lower extremity chronic skin changes.  Sepsis physiology has improved.  MRI of the left lower extremity showed cellulitis but no evidence of abscess/osteomyelitis.  ABI was difficult to obtain due to blisters.  Blood cultures have been negative.  Seen by podiatry Dr. Jacqualyn Posey who recommended outpatient follow-up.  Patient  completed antibiotics course.  Cellulitis has improved.  Volume overload: Most likely secondary to progression of renal disease.  Echocardiogram showed normal systolic function.  No evidence of pulmonary hypertension.  Patient was also seen by cardiology during this hospitalization.  Continue diuretics at current dose.  Nausea/vomiting: Resolved  Constipation: Continue bowel regimen  Left shoulder/left ankle pain: Most likely from acute mild gout flare.  Currently improved.  X-ray also showed moderate left knee effusion: Inflammatory versus infectious.  X-ray showed chronic changes.  MRI showed soft tissue inflammation concerning for cellulitis with moderate left knee effusion.  He was seen by orthopedics.  Status post aspiration on 6/14 with finding of inflammatory fluid.  Started on colchicine,now stopped.  Doing much better now.  We recommend to follow-up with orthopedics as an outpatient with Dr. Erlinda Hong.  Bradycardia: Metoprolol has been discontinued.  Continue amiodarone.  Currently heart rate is stable.  Hypertension: Currently blood pressure stable.  Continue amlodipine, hydralazine.  Metoprolol held due to bradycardia.  Diabetes type 2 with neuropathy: Hemoglobin A1c of 8.  On Lantus and sliding scale insulin.  Paroxysmal A. fib: Status post cardioversion.  On amiodarone and Eliquis.  Abnormal thyroid function test: Elevated TSH at 7.37 with normal free T4.  We recommend to follow-up as an outpatient for thyroid function test in  few weeks.  Normocytic anemia: Most likely associated with chronic kidney disease.  Hemoglobin dropped to 7 .  He was transfused with 1 unit of PRBC.  He has mild leukocytosis.  Check CBC in a week.  Morbid obesity: BMI of 45.1   Discharge Diagnoses:  Principal Problem:   Cellulitis of left lower extremity Active Problems:   Charcot foot due to diabetes mellitus ([place])   Essential hypertension  Uncontrolled type 2 diabetes mellitus with  diabetic polyneuropathy, without long-term current use of insulin ([facility])   Paroxysmal atrial fibrillation ([facility])   Chronic anticoagulation   AKI (acute kidney injury) ([place])   Left leg cellulitis   Sepsis ([place])   Acute pain of left knee   Venous stasis    Discharge Instructions  Discharge Instructions    Diet - low sodium heart healthy   Complete by: As directed    Discharge instructions   Complete by: As directed    1)Please follow up with your PCP in a week.  Do a CBC, BMP just during the follow-up 2)You have an appointment with nephrology on 10/16/2019 at 1:30 PM.  Name and number of the provider has been attached. 3) You have an appointment with cardiology on 10/12/2019 at 9:15 AM.  Name and number of the provider has been attached 4)Follow up with Dr. Jacqualyn Posey ,podiatry in 2 weeks. 5)Follow up with Dr. Erlinda Hong, orthopedics in 4 weeks. 6)Take prescribed medications as instructed.   Discharge wound care:   Complete by: As directed    Follow up with home health   Increase activity slowly   Complete by: As directed      Allergies as of 09/28/2019      Reactions   Metformin And Related Other (See Comments)   GI upset      Medication List    STOP taking these medications   doxycycline 100 MG tablet Commonly known as: VIBRA-TABS   metoprolol tartrate 100 MG tablet Commonly known as: LOPRESSOR     TAKE these medications   Accu-Chek Aviva device Use as instructed to check blood sugar 2 times daily. E11.8 E11.65 Z79.4 What changed:   how much to take  how to take this  when to take this   Accu-Chek Aviva test strip Generic drug: glucose blood Use as instructed to check blood sugar 2 times daily. E11.8 E11.65 Z79.4 What changed:   how much to take  how to take this  when to take this   Accu-Chek FastClix Lancets Misc Use as directed to check blood sugar at least twice daily. E11.8 E11.65 Z79.4 What changed:   how much to take  how to take this  when to take  this   amiodarone 200 MG tablet Commonly known as: PACERONE Take 200 mg by mouth 2 (two) times daily.   amLODipine 10 MG tablet Commonly known as: NORVASC Take 1 tablet (10 mg total) by mouth daily. Start taking on: September 29, 2019   apixaban 5 MG Tabs tablet Commonly known as: Eliquis Take 1 tablet (5 mg total) by mouth 2 (two) times daily.   atorvastatin 20 MG tablet Commonly known as: LIPITOR Take 1 tablet (20 mg total) by mouth daily.   Ex-Lax 15 MG Tabs Generic drug: Sennosides Take 15 mg by mouth daily as needed (Constipation).   furosemide 80 MG tablet Commonly known as: LASIX Take 1 tablet (80 mg total) by mouth 2 (two) times daily. What changed:   medication strength  how much to take  when to take this   gabapentin 300 MG capsule Commonly known as: NEURONTIN Take 1 capsule (300 mg total) by mouth 2 (two) times daily. What changed:   how much to take  when to take this  reasons to take this   hydrALAZINE 50 MG tablet Commonly known as: APRESOLINE Take 1 tablet (50 mg total) by mouth every 8 (eight) hours.   insulin lispro 100 UNIT/ML KwikPen  Junior Generic drug: insulin lispro Inject 0.08 mLs (8 Units total) into the skin 3 (three) times daily. What changed:   when to take this  reasons to take this   Insulin Pen Needle 31G X 8 MM Misc Commonly known as: B-D ULTRAFINE III SHORT PEN 1 each by Does not apply route 3 (three) times daily.   Lantus SoloStar 100 UNIT/ML Solostar Pen Generic drug: insulin glargine Inject 50 Units into the skin 2 (two) times daily. What changed: how much to take   oxyCODONE-acetaminophen 10-325 MG tablet Commonly known as: PERCOCET Take 1 tablet by mouth every 4 (four) hours as needed for pain.   polyethylene glycol 17 g packet Commonly known as: MIRALAX / GLYCOLAX Take 17 g by mouth daily as needed for mild constipation.   sodium chloride 0.65 % Soln nasal spray Commonly known as: OCEAN Place 1 spray into  both nostrils daily as needed for congestion.   TRUEplus Insulin Syringe 30G X 5/16" 0.5 ML Misc Generic drug: Insulin Syringe-Needle U-100 USE AS DIRECTED 3 TIMES DAILY What changed: See the new instructions.   Insulin Syringe-Needle U-100 30G X 5/16" 0.5 ML Misc Commonly known as: TRUEplus Insulin Syringe Use as directed 3 times daily What changed:   how much to take  how to take this  when to take this   Victoza 18 MG/3ML Sopn Generic drug: liraglutide Inject 0.3 mLs (1.8 mg total) into the skin daily.            Discharge Care Instructions  (From admission, onward)         Start     Ordered   09/28/19 0000  Discharge wound care:       Comments: Follow up with home health   09/28/19 1128          Follow-up Information    Erlene Quan, PA-C Follow up.   Specialties: Cardiology, Radiology Why: [facility] - an appointment has been made for you on Thursday October 12, 2019 9:15 AM (Arrive by 9:00 AM). Lurena Joiner is one of the PAs that works closely with Dr. Claiborne Billings and our cardiology team. Contact information: [place] [address] [phone]        Corliss Parish, MD Follow up on 10/16/2019.   Specialty: Nephrology Why: follow up appt w/ kidney doctor for kidney failure and biopsy results, appt is for 1:30 pm on Monday July 19th, please arrive 15 min early Contact information: [place] [address] [phone]        [name], MD. Schedule an appointment as soon as possible for a visit in 1 week(s).   Specialty: Family Medicine Contact information: [place] [address] [phone]        [name], MD. Schedule an appointment as soon as possible for a visit in 4 week(s).   Specialty: Orthopedic Surgery Contact information: [place] [address] [phone]        Trula Slade, DPM. Schedule an appointment as soon as possible for a visit  in 2 week(s).   Specialty: Podiatry Contact information: [address] [place] [address] [phone]              Allergies  Allergen Reactions  . Metformin And Related Other (See Comments)    GI upset    Consultations:  Nephrology, IR, podiatry, cardiology   Procedures/Studies: DG Chest 2 View  Result Date: 09/15/2019 CLINICAL  DATA:  Volume overload EXAM: CHEST - 2 VIEW COMPARISON:  Multiple priors, most recent 09/07/2019 FINDINGS: Prominent, cephalized vascularity compatible with vascular congestion. Lung volumes are low with streaky opacities in the bases favoring atelectasis. Few septal lines are present with some central vascular cuffing. Stable cardiomediastinal contours from prior without frank cardiomegaly on this portable exam. No pneumothorax or effusion. No acute osseous or soft tissue abnormality. Degenerative changes are present in the imaged spine and shoulders. Prior right shoulder rotator cuff repair. Telemetry leads overlie the chest. IMPRESSION: 1. Vascular congestion. Subtle features of at most mild interstitial edema. No effusions. 2. Low lung volumes with streaky opacities in the bases favoring atelectasis. Electronically Signed   By: Lovena Le M.D.   On: 09/15/2019 16:21   DG Chest 2 View  Result Date: 09/07/2019 CLINICAL DATA:  Left shoulder pain. EXAM: CHEST - 2 VIEW COMPARISON:  PA and lateral chest 10/24/2017. FINDINGS: There is cardiomegaly and mild interstitial edema. No consolidative process, pneumothorax or effusion. No acute or focal bony abnormality. IMPRESSION: Cardiomegaly and interstitial edema. Electronically Signed   By: Inge Rise M.D.   On: 09/07/2019 14:40   DG Knee 1-2 Views Left  Result Date: 09/10/2019 CLINICAL DATA:  Left lower extremity cellulitis EXAM: LEFT KNEE - 1-2 VIEW COMPARISON:  None. FINDINGS: Mild degenerative changes in the left knee with joint space narrowing and spurring in all 3 compartments.  Small joint effusion. Anterior soft tissue swelling. No acute bony abnormality. Specifically, no fracture, subluxation, or dislocation. IMPRESSION: Soft tissue swelling. Degenerative changes with small joint effusion. No acute bony abnormality. Electronically Signed   By: Rolm Baptise M.D.   On: 09/10/2019 19:33   US RENAL  Result Date: 09/09/2019 CLINICAL DATA:  Acute kidney injury EXAM: RENAL / URINARY TRACT ULTRASOUND COMPLETE COMPARISON:  May 14th 2017 FINDINGS: Markedly limited exam due to patient body habitus. Right Kidney: Renal measurements: 11.3 x 6.1 x 6.2 cm = volume: 225 mL. No hydronephrosis. Very limited assessment of the kidneys with estimation of renal size given poor visualization. Parenchymal thickness is grossly preserved. Left Kidney: Renal measurements: 12 x 5.6 x 5.9 (volume = 210) cm = volume: 210 mL. Parenchymal thickness grossly preserved and without signs of hydronephrosis. The very limited assessment due to body habitus as described. Bladder: Appears normal for degree of bladder distention. Other: None. IMPRESSION: 1. Very limited evaluation. No sign of gross hydronephrosis with relative preservation of cortical thickness. Electronically Signed   By: Zetta Bills M.D.   On: 09/09/2019 11:13   MR TIBIA FIBULA LEFT WO CONTRAST  Result Date: 09/08/2019 CLINICAL DATA:  Left leg cellulitis. EXAM: MRI OF LOWER LEFT EXTREMITY WITHOUT CONTRAST TECHNIQUE: Multiplanar, multisequence MR imaging of the left lower leg was performed. No intravenous contrast was administered. COMPARISON:  None. FINDINGS: Bones/Joint/Cartilage No marrow signal abnormality. No fracture or dislocation. Muscles and Tendons Intact. No muscle edema. Severe atrophy of the distal flexor and extensor muscles. Soft tissue Moderate circumferential soft tissue swelling. No fluid collection or hematoma. No soft tissue mass. IMPRESSION: 1. Moderate circumferential soft tissue swelling of the lower leg, consistent with  history of cellulitis. No abscess or osteomyelitis. Electronically Signed   By: Titus Dubin M.D.   On: 09/08/2019 17:57   MR FEMUR LEFT WO CONTRAST  Result Date: 09/08/2019 CLINICAL DATA:  Left leg cellulitis. EXAM: MR OF THE LEFT FEMUR WITHOUT CONTRAST TECHNIQUE: Multiplanar, multisequence MR imaging of the left femur was performed. No intravenous contrast was administered. COMPARISON:  None. FINDINGS: Bones/Joint/Cartilage No marrow signal abnormality. No fracture or dislocation. Moderate left knee effusion without synovitis. Muscles and Tendons Scattered mild patchy edema involving the adductor and quadriceps muscles, nonspecific, but likely related to diabetic muscle changes. No significant muscle atrophy. Soft tissue Moderate soft tissue swelling of the medial and lateral thigh. No fluid collection or hematoma. No soft tissue mass. IMPRESSION: 1. Moderate soft tissue swelling of the medial and lateral aspects of the left thigh, consistent with cellulitis. No abscess or osteomyelitis. 2. Moderate left knee effusion without synovitis, likely reactive. Electronically Signed   By: Titus Dubin M.D.   On: 09/08/2019 17:53   DG Shoulder Left  Result Date: 09/07/2019 CLINICAL DATA:  Left shoulder pain EXAM: LEFT SHOULDER - 2+ VIEW COMPARISON:  03/01/2018 FINDINGS: There is no evidence of fracture or dislocation. Mild-to-moderate arthropathy of the [facility] joint. Glenohumeral joint is within normal limits. Soft tissues are unremarkable. Vascular congestion and interstitial opacities within the visualized left lung. IMPRESSION: 1. No acute osseous abnormality. Mild-to-moderate arthropathy of the left AC joint. 2. Vascular congestion and interstitial opacities within the visualized left lung. Suggest dedicated PA and lateral chest radiographs. Electronically Signed   By: Davina Poke D.O.   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 No acute fracture identified. Pes planus. Moderate arthropathy at the tarsometatarsal joints with prominent dorsal hypertrophy. Chronic deformity of the second digit proximal phalanx. No cortical destruction or periostitis is seen. There is diffuse soft tissue prominence. No soft tissue gas. IMPRESSION: 1. No acute osseous abnormality. No radiographic evidence of acute osteomyelitis. 2. Diffuse soft tissue prominence. 3. Moderate arthropathy at the tarsometatarsal joints. Electronically Signed   By: Davina Poke D.O.   On: 09/07/2019 13:38   DG Foot Complete Right  Result Date: 09/07/2019 CLINICAL DATA:  Chronic right foot pain EXAM: RIGHT FOOT COMPLETE - 3+ VIEW COMPARISON:  08/07/2019 FINDINGS: Chronic findings of Charcot arthropathy of the right midfoot with fragmentation and remodeling of the osseous structures. No appreciable interval progression from prior radiograph. No new or acute fracture identified. There is a appears to be a soft tissue wound or defect the lateral aspect of the foot near the fifth metatarsal base. Progressive destructive changes at the fourth and fifth metatarsal bases could reflect changes related to progressive Charcot arthropathy versus osteomyelitis. IMPRESSION: 1. Findings of Charcot arthropathy of the right midfoot with progressive destructive changes at the fourth and fifth metatarsal bases could reflect changes related to progressive Charcot arthropathy versus osteomyelitis. Correlation with serum inflammatory markers is suggested. An MRI could also be considered, although sensitivity for detection of osteomyelitis is decreased in the setting of concurrent neuropathic arthropathy. 2. Soft tissue wound or defect the lateral aspect of the foot near the fifth metatarsal base. Electronically Signed   By: Davina Poke D.O.   On: 09/07/2019 13:43   ECHOCARDIOGRAM  COMPLETE  Result Date: 09/18/2019    ECHOCARDIOGRAM REPORT   Patient Name:   Jeffery [name] Date of Exam: 09/18/2019 Medical Rec #:  [id number]           Height:       74.0 in Accession #:    [id number]          Weight:       354.9 lb Date of Birth:  [birth year]  BSA:          2.773 m Patient Age:    66 years            BP:           146/74 mmHg Patient Gender: M                   HR:           84 bpm. Exam Location:  Inpatient Procedure: 2D Echo, Cardiac Doppler and Color Doppler Indications:    Volume Overload. [id number]  History:        Patient has prior history of Echocardiogram examinations, most                 recent 02/28/2019.  Sonographer:    Tiffany Dance Referring Phys: [id number] [place]  1. Normal LV function; moderate LVE; mild LAE.  2. Left ventricular ejection fraction, by estimation, is 60 to 65%. The left ventricle has normal function. The left ventricle has no regional wall motion abnormalities. The left ventricular internal cavity size was moderately dilated. Left ventricular diastolic parameters were normal.  3. Right ventricular systolic function is normal. The right ventricular size is normal.  4. Left atrial size was mildly dilated.  5. The mitral valve is normal in structure. No evidence of mitral valve regurgitation. No evidence of mitral stenosis.  6. The aortic valve is normal in structure. Aortic valve regurgitation is not visualized. No aortic stenosis is present.  7. The inferior vena cava is normal in size with greater than 50% respiratory variability, suggesting right atrial pressure of 3 mmHg. FINDINGS  Left Ventricle: Left ventricular ejection fraction, by estimation, is 60 to 65%. The left ventricle has normal function. The left ventricle has no regional wall motion abnormalities. The left ventricular internal cavity size was moderately dilated. There is no left ventricular hypertrophy. Left ventricular diastolic parameters were normal. Right Ventricle:  The right ventricular size is normal.Right ventricular systolic function is normal. Left Atrium: Left atrial size was mildly dilated. Right Atrium: Right atrial size was normal in size. Pericardium: Trivial pericardial effusion is present. Mitral Valve: The mitral valve is normal in structure. Normal mobility of the mitral valve leaflets. No evidence of mitral valve regurgitation. No evidence of mitral valve stenosis. Tricuspid Valve: The tricuspid valve is normal in structure. Tricuspid valve regurgitation is trivial. No evidence of tricuspid stenosis. Aortic Valve: The aortic valve is normal in structure. Aortic valve regurgitation is not visualized. No aortic stenosis is present. Pulmonic Valve: The pulmonic valve was normal in structure. Pulmonic valve regurgitation is not visualized. No evidence of pulmonic stenosis. Aorta: The aortic root is normal in size and structure. Venous: The inferior vena cava is normal in size with greater than 50% respiratory variability, suggesting right atrial pressure of 3 mmHg. IAS/Shunts: No atrial level shunt detected by color flow Doppler. Additional Comments: Normal LV function; moderate LVE; mild LAE.  LEFT VENTRICLE PLAX 2D LVIDd:         6.00 cm  Diastology LVIDs:         4.30 cm  LV e' lateral:   11.50 cm/s LV PW:         1.10 cm  LV E/e' lateral: 8.6 LV IVS:        1.20 cm  LV e' medial:    9.46 cm/s LVOT diam:     2.20 cm  LV E/e' medial:  10.5 LV SV:  98 LV SV Index:   35 LVOT Area:     3.80 cm  RIGHT VENTRICLE             IVC RV Basal diam:  3.60 cm     IVC diam: 2.00 cm RV Mid diam:    3.10 cm RV S prime:     19.10 cm/s TAPSE (M-mode): 2.3 cm LEFT ATRIUM              Index       RIGHT ATRIUM           Index LA diam:        5.20 cm  1.87 cm/m  RA Area:     24.40 cm LA Vol (A2C):   135.0 ml 48.68 ml/m RA Volume:   70.20 ml  25.31 ml/m LA Vol (A4C):   107.0 ml 38.58 ml/m LA Biplane Vol: 123.0 ml 44.35 ml/m  AORTIC VALVE LVOT Vmax:   116.00 cm/s LVOT Vmean:   76.200 cm/s LVOT VTI:    0.259 m  AORTA Ao Root diam: 4.00 cm Ao Asc diam:  3.40 cm MITRAL VALVE MV Area (PHT): 2.11 cm    SHUNTS MV Decel Time: 359 msec    Systemic VTI:  0.26 m MV E velocity: 98.90 cm/s  Systemic Diam: 2.20 cm MV A velocity: 87.00 cm/s MV E/A ratio:  1.14 Kirk Ruths MD Electronically signed by Kirk Ruths MD Signature Date/Time: 09/18/2019/3:36:52 PM    Final    US BIOPSY (KIDNEY)  Result Date: 09/26/2019 INDICATION: ACUTE KIDNEY INJURY EXAM: ULTRASOUND GUIDED CORE BIOPSY OF LEFT KIDNEY CORTEX MEDICATIONS: 1% lidocaine local ANESTHESIA/SEDATION: Versed 2.0mg  IV; Fentanyl 1102mcg IV; Moderate Sedation Time:  10 minutes The patient was continuously monitored during the procedure by the interventional radiology nurse under my direct supervision. FLUOROSCOPY TIME:  Fluoroscopy Time: None. COMPLICATIONS: None immediate. PROCEDURE: The procedure, risks, benefits, and alternatives were explained to the patient. Questions regarding the procedure were encouraged and answered. The patient understands and consents to the procedure. Previous imaging reviewed. Patient position prone. Preliminary ultrasound performed. The left kidney was localized and marked for a posterior approach. Under sterile conditions and local anesthesia, a 15 gauge coaxial guide needle was advanced to the left renal cortex. Needle position confirmed with ultrasound. Images obtained for documentation. Through the access, 2 16 gauge core biopsies obtained of the cortex. Samples were intact and non fragmented. These were placed in saline. Needle tract occluded with Gel-Foam. Postprocedure imaging demonstrates no large hemorrhage or hematoma. Patient tolerated biopsy well. FINDINGS: Imaging confirms needle placement to the left kidney cortex for core biopsy IMPRESSION: Successful ultrasound left renal cortex 16 gauge core biopsy Electronically Signed   By: Jerilynn Mages.  Shick M.D.   On: 09/26/2019 12:59   US Abdomen Limited  RUQ  Result Date: 09/11/2019 CLINICAL DATA:  Elevated LFTs. History fatty liver, hypertension, obesity and diabetes. EXAM: ULTRASOUND ABDOMEN LIMITED RIGHT UPPER QUADRANT COMPARISON:  Right upper quadrant abdominal ultrasound-08/11/2015 FINDINGS: Examination is degraded due to patient body habitus, bowel gas and poor sonographic window Gallbladder: About the gallbladder is underdistended and poorly visualized. Given this limitation, there is no gallbladder wall thickening or evidence of echogenic gallstones or biliary sludge. Negative sonographic Murphy sign. Common bile duct: Diameter: Obscured by bowel gas. Liver: There is mild diffuse increased slightly coarsened echogenicity of the hepatic parenchyma. No discrete hepatic lesions. No intrahepatic biliary duct dilatation. No ascites. Portal vein is patent on color Doppler imaging with normal direction of blood  flow towards the liver. Other: None. IMPRESSION: 1. Degraded examination secondary to patient body habitus and bowel gas. 2. Similar findings suggestive of hepatic steatosis. Electronically Signed   By: Sandi Mariscal M.D.   On: 09/11/2019 07:53       Subjective: Patient seen and examined at the bedside this morning.  Hemodynamically stable for discharge.  Discharge Exam: Vitals:   09/27/19 2055 09/28/19 0500  BP: (!) 146/71 (!) 151/98  Pulse: 81 86  Resp: 20 20  Temp: 98.6 F (37 C) 98.3 F (36.8 C)  SpO2: 97% 98%   Vitals:   09/27/19 1648 09/27/19 1835 09/27/19 2055 09/28/19 0500  BP: (!) 153/75 (!) 143/75 (!) 146/71 (!) 151/98  Pulse: 91 85 81 86  Resp: 20 20 20 20   Temp: 97.8 F (36.6 C) 98.3 F (36.8 C) 98.6 F (37 C) 98.3 F (36.8 C)  TempSrc: Oral Oral    SpO2: 99% 99% 97% 98%  Weight:    (!) 165.4 kg  Height:        General: Pt is alert, awake, not in acute distress Cardiovascular: RRR, S1/S2 +, no rubs, no gallops Respiratory: CTA bilaterally, no wheezing, no rhonchi Abdominal: Soft, NT, ND, bowel sounds  + Extremities: Bilateral lower extremity edema, bilateral legs wrapped with dressings    The results of significant diagnostics from this hospitalization (including imaging, microbiology, ancillary and laboratory) are listed below for reference.     Microbiology: No results found for this or any previous visit (from the past 240 hour(s)).   Labs: BNP (last 3 results) Recent Labs    09/09/19 0620 09/12/19 0358  BNP 159.5* 433.2*   Basic Metabolic Panel: Recent Labs  Lab 09/24/19 0439 09/25/19 0442 09/26/19 0351 09/27/19 0325 09/28/19 0319  NA 131* 134* 133* 136 137  K 4.8 4.8 4.4 4.4 4.6  CL 96* 100 99 100 100  CO2 24 22 25 27 24   GLUCOSE 140* 100* 73 130* 89  BUN 77* 75* 72* 69* 66*  CREATININE 4.39* 4.91* 4.76* 4.87* 4.96*  CALCIUM 8.8* 8.6* 8.5* 8.7* 8.5*  PHOS 5.3* 4.6 4.7* 4.6 4.6   Liver Function Tests: Recent Labs  Lab 09/24/19 0439 09/25/19 0442 09/26/19 0351 09/27/19 0325 09/28/19 0319  ALBUMIN 2.6* 2.5* 2.5* 2.6* 2.7*   No results for input(s): LIPASE, AMYLASE in the last 168 hours. No results for input(s): AMMONIA in the last 168 hours. CBC: Recent Labs  Lab 09/24/19 0439 09/25/19 0442 09/26/19 0351 09/27/19 0325 09/28/19 0319  WBC 10.7* 10.2 10.1 10.7* 13.1*  HGB 7.8* 7.6* 7.3* 7.0* 7.6*  HCT 25.9* 25.0* 24.0* 22.6* 24.7*  MCV 88.1 88.0 87.6 86.6 88.2  PLT 164 136* 125* 132* 157   Cardiac Enzymes: No results for input(s): CKTOTAL, CKMB, CKMBINDEX, TROPONINI in the last 168 hours. BNP: Invalid input(s): POCBNP CBG: Recent Labs  Lab 09/27/19 1216 09/27/19 1724 09/27/19 2056 09/28/19 0657 09/28/19 0746  GLUCAP 132* 139* 147* 98 102*   D-Dimer No results for input(s): DDIMER in the last 72 hours. Hgb A1c No results for input(s): HGBA1C in the last 72 hours. Lipid Profile No results for input(s): CHOL, HDL, LDLCALC, TRIG, CHOLHDL, LDLDIRECT in the last 72 hours. Thyroid function studies No results for input(s): TSH, T4TOTAL,  T3FREE, THYROIDAB in the last 72 hours.  Invalid input(s): FREET3 Anemia work up No results for input(s): VITAMINB12, FOLATE, FERRITIN, TIBC, IRON, RETICCTPCT in the last 72 hours. Urinalysis    Component Value Date/Time   COLORURINE YELLOW 09/18/2019  [place] 09/18/2019 1456   LABSPEC 1.006 09/18/2019 1456   PHURINE 7.0 09/18/2019 1456   GLUCOSEU NEGATIVE 09/18/2019 1456   HGBUR LARGE (A) 09/18/2019 1456   BILIRUBINUR NEGATIVE 09/18/2019 1456   BILIRUBINUR neg 12/31/2016 1401   KETONESUR NEGATIVE 09/18/2019 1456   PROTEINUR 30 (A) 09/18/2019 1456   UROBILINOGEN 4.0 (A) 12/31/2016 1401   UROBILINOGEN 0.2 10/01/2014 0951   NITRITE NEGATIVE 09/18/2019 1456   LEUKOCYTESUR NEGATIVE 09/18/2019 1456   Sepsis Labs Invalid input(s): PROCALCITONIN,  WBC,  LACTICIDVEN Microbiology No results found for this or any previous visit (from the past 240 hour(s)).  Please note: You were cared for by a hospitalist during your hospital stay. Once you are discharged, your primary care physician will handle any further medical issues. Please note that NO REFILLS for any discharge medications will be authorized once you are discharged, as it is imperative that you return to your primary care physician (or establish a relationship with a primary care physician if you do not have one) for your post hospital discharge needs so that they can reassess your need for medications and monitor your lab values.    Time coordinating discharge: 40 minutes  SIGNED:   Shelly Coss, MD  [facility] 09/28/2019, 11:29 AM Pager [phone]  If 7PM-7AM, please contact night-coverage [email or web address] Password [email or web address]

## 2019-09-28 NOTE — TOC Transition Note (Addendum)
Transition of Care [facility]) - CM/SW Discharge Note   Patient Details  Name: Jeffery [name] MRN: [id number] Date of Birth: December 02, 1960  Transition of Care [facility] CM/SW Contact:  Ross Ludwig, LCSW Phone Number: 09/28/2019, 12:19 PM   Clinical Narrative:     CSW spoke to patient and asked if he was interested in home health PT and OT.  Patient initially said no, but wanted CSW to call him back in a hour because was on the phone.  Patient received orders for a rolling walker, CSW notified [employer] that he will need a walker.  CSW to call patient back around 1pm to see if he wants home health PT and OT.  1:30pm  CSW spoke to patient and he decided he would like to have home health see him.  Patient stated he had [facility] in the past, and was familiar with what to expect.  CSW asked if he had a preference for home health agencies and he did not.  CSW was able to arrange [facility] with [facility].  CSW spoke to [place] at [facility] and they can see patient over weekend.  CSW updated patient and he stated that is fine with him.  Patient received a heavy duty rolling walker from [facility].  CSW signing off, patient will be discharging home with home health today.   Final next level of care: Home/Self Care Barriers to Discharge: Barriers Resolved   Patient Goals and CMS Choice Patient states their goals for this hospitalization and ongoing recovery are:: To return back home CMS Medicare.gov Compare Post Acute Care list provided to:: Patient Choice offered to / list presented to : Patient  Discharge Placement                       Discharge Plan and Services                DME Arranged: Walker rolling DME Agency: [employer] Date DME Agency Contacted: 09/28/19 Time DME Agency Contacted: 1217 Representative spoke with at DME Agency: [place]: Refused HH          Social Determinants of Health ([place]) Interventions     Readmission Risk Interventions No flowsheet data  found.

## 2019-09-28 NOTE — Progress Notes (Signed)
Discharge instructions given with stated understanding.  Patient waiting for transportation home at this time 

## 2019-09-28 NOTE — Plan of Care (Signed)

## 2019-09-28 NOTE — Progress Notes (Signed)
Jeffery [name] Progress Note  Subjective: creat stable at 4.5- 4.9 range, good UOP yest and no hematuria per the patient. Wants to go home  Vitals:   09/27/19 1648 09/27/19 1835 09/27/19 2055 09/28/19 0500  BP: (!) 153/75 (!) 143/75 (!) 146/71 (!) 151/98  Pulse: 91 85 81 86  Resp: 20 20 20 20  Temp: 97.8 F (36.6 C) 98.3 F (36.8 C) 98.6 F (37 C) 98.3 F (36.8 C)  TempSrc: Oral Oral    SpO2: 99% 99% 97% 98%  Weight:    (!) 165.4 kg  Height:        Exam: Gen alert, no distress No jvd or bruits Chest clear bilat to bases  RRR no MRG Abd very obese, distended, no mass or ascites +bs GU normal male Ext bilat LE/ UE 1-2+, improved Neuro is alert, Ox 3 , nf    Date              Creat               eGFR  2012- 18         0.69- 0.90            2019- 2020     0.96- 1.13 May 2019         0.98  May 2021       1.10-  2.31       40- >60 ml/min  September 04, 2019  1.63                 46 Since then worsening - has been 3.3 to 3.8 since 6/12-  Not a clear trend              UA Oct 2018 - negative   UA 08/08/19 - cloudy, 100 prot, few bact, 0-5 rbc, 11-20 wbc   UA 09/10/19 - hazy, large Hb, 30 prot, >50 rbc, 11-20 wbc    Renal US 6/12 - 11.3/ 12.0 cm kidneys, limited exam d/t body habitus, no hydro, relative preservation of cortical thickness   ECHO 02/2019 - LVEF 55-60%, +LVH, mild reduced RV fxn, no valve issues    I/O since admit are 11.2L in and 15.8 L out  = - 4.5 L net    Wt's down 9kg from admit wts   BP's high since admission   UP/C ratio = 0.8   Anti-GBM neg, ANA neg,  MPO/ pr-3 neg  Assessment/ Plan: 1. AoCKD 3 - baseline creat 1.6, eGFR 46 ml/min. Baseline CKD likely due to DM/ HTN.  Here creat 2.5 >> 3.8, in setting of marked vol overload, hx of RV dysfunction by echo, normal LVEF. UP/C ratio 0.8. Possible cardiorenal element.  UA +wbc/ rbc, repeat shows same.  Recovery stalled for some reason w/ creat plateau 4.5- 4.9.  Maybe because BP is better  controlled ?  Possibly some relative hypotension for him ? With hematuria we checked some serologies for completeness sake which were negative.  Pt had renal bx 6/29 to look for cause of acute / chronic renal disease. Has done well and feels much better. Ready for dc from renal standpoint. Biopsy results will be followed up in OP setting. Pt has f/u appt w/ Dr G[place] on July 19th at 1:30 pm (details in DC section). Cont lasix at 80 bid at dc. Will sign off.   2. Vol overload - as above- down 20 lbs in weight, still has vol overload. Needs to restrict fluid   intake . CXR negative 6/18.  3. HTN - BP's have been high since arrival, getting norvasc 10 qd and hydralazine 50 tid. Getting po lasix 80 bid. Avoid acei /ARB  4. DM2 on insulin 5. Atrial fibrillation - on amio, eliquis 6. LLE cellulitis - sp vanc, on linezolid  7. Morbid obesity 8. R HF - by last echo 02/2019 9. Anemia  -  In the 8's-   iron stores OK -  giving ESA    Kelly Splinter, MD 09/28/2019, 11:01 AM       Recent Labs  Lab 09/27/19 0325 09/28/19 0319  K 4.4 4.6  BUN 69* 66*  CREATININE 4.87* 4.96*  CALCIUM 8.7* 8.5*  PHOS 4.6 4.6  HGB 7.0* 7.6*   Inpatient medications: . amiodarone  200 mg Oral BID  . amLODipine  10 mg Oral Daily  . apixaban  5 mg Oral BID  . atorvastatin  20 mg Oral Daily  . darbepoetin (ARANESP) injection - NON-DIALYSIS  100 mcg Subcutaneous Q Fri-1800  . furosemide  80 mg Oral BID  . gabapentin  300 mg Oral BID  . hydrALAZINE  100 mg Oral Q8H  . insulin aspart  0-9 Units Subcutaneous TID WC  . insulin glargine  25 Units Subcutaneous BID  . polyethylene glycol  17 g Oral BID  . senna-docusate  2 tablet Oral BID    acetaminophen **OR** acetaminophen, bisacodyl, labetalol, morphine injection, ondansetron **OR** ondansetron (ZOFRAN) IV, oxyCODONE-acetaminophen **AND** oxyCODONE

## 2019-09-29 ENCOUNTER — Telehealth: Payer: Self-pay | Admitting: Podiatry

## 2019-09-29 ENCOUNTER — Telehealth: Payer: Self-pay

## 2019-09-29 NOTE — Telephone Encounter (Signed)
They were applying zero form to the wounds then applying ABD pads and wrapping the legs from the toes to just below the knees with Kerlix then ACE bandages.   The graft was no longer present. There was a fibrotic type wound there. If it looks OK he can't start santyl once he comes in to be seen.   Also we had put in for wound care referral but he didn't get to go because he was in the hospital. Can you please put it back in?    He needs to keep the legs elevated.

## 2019-09-29 NOTE — Telephone Encounter (Signed)
Patient was called and set up first available appointment.

## 2019-09-29 NOTE — Telephone Encounter (Addendum)
I spoke with pt and asked if he had a fever or redness or drainage he denied. Pt states they at the hospital put on soft yellow gauze over graft and wounds, then the soft cast, now has blisters on the legs that are not covered. Pt states every thing looks good except for the dry skin, and he was concerned with the graft. Pt states he popped the blisters and washed with alcohol. I told pt not to do the alcohol wash again cover the blister areas with neosporin and I would have scheduler contact to get in to see a doctor next week. Pt states he has seen Dr. Posey Pronto before.

## 2019-09-29 NOTE — Telephone Encounter (Signed)
-----   Message from [name], MD sent at 09/29/2019 10:23 AM EDT ----- Regarding: Abnormal thyroid labs Hi,Can you please have this patient scheduled appointment with me to follow-up for his abnormal thyroid labs obtained from his cardiology visit?Marinda Elk. ----- Message ----- From: Jacqulynn Cadet, CMA Sent: 09/05/2019   9:27 AM EDT To: [name], MD  Recent lab results. Patient will be treated by our office with [facility] when he arrives for his appointment this afternoon.

## 2019-09-29 NOTE — Telephone Encounter (Signed)
Patient is requesting Jeffery [name] on how to care for his legs that are bandaged. He has a skin graft on right foot it doesn't look like it healing correctly. He also says that he has blisters that are popping up.Please advise.

## 2019-09-29 NOTE — Telephone Encounter (Signed)
I informed pt of Dr. Leigh Aurora orders to keep legs elevate, and call [place]. Pt states he will call Wound Care again.

## 2019-10-01 ENCOUNTER — Emergency Department (HOSPITAL_COMMUNITY): Payer: Medicaid Other

## 2019-10-01 ENCOUNTER — Other Ambulatory Visit: Payer: Self-pay

## 2019-10-01 ENCOUNTER — Emergency Department (HOSPITAL_COMMUNITY)
Admission: EM | Admit: 2019-10-01 | Discharge: 2019-10-01 | Disposition: A | Discharge status: Home / Self Care | Payer: Medicaid Other | Attending: Emergency Medicine | Admitting: Emergency Medicine

## 2019-10-01 ENCOUNTER — Encounter (HOSPITAL_COMMUNITY): Payer: Self-pay

## 2019-10-01 DIAGNOSIS — M79604 Pain in right leg: Secondary | ICD-10-CM | POA: Diagnosis present

## 2019-10-01 DIAGNOSIS — R0902 Hypoxemia: Secondary | ICD-10-CM | POA: Diagnosis not present

## 2019-10-01 DIAGNOSIS — Z7984 Long term (current) use of oral hypoglycemic drugs: Secondary | ICD-10-CM | POA: Diagnosis not present

## 2019-10-01 DIAGNOSIS — I129 Hypertensive chronic kidney disease with stage 1 through stage 4 chronic kidney disease, or unspecified chronic kidney disease: Secondary | ICD-10-CM | POA: Diagnosis not present

## 2019-10-01 DIAGNOSIS — E875 Hyperkalemia: Secondary | ICD-10-CM | POA: Diagnosis not present

## 2019-10-01 DIAGNOSIS — I10 Essential (primary) hypertension: Secondary | ICD-10-CM | POA: Diagnosis not present

## 2019-10-01 DIAGNOSIS — N189 Chronic kidney disease, unspecified: Secondary | ICD-10-CM | POA: Insufficient documentation

## 2019-10-01 DIAGNOSIS — Z79899 Other long term (current) drug therapy: Secondary | ICD-10-CM | POA: Diagnosis not present

## 2019-10-01 DIAGNOSIS — M79605 Pain in left leg: Secondary | ICD-10-CM | POA: Insufficient documentation

## 2019-10-01 DIAGNOSIS — R0602 Shortness of breath: Secondary | ICD-10-CM | POA: Diagnosis not present

## 2019-10-01 DIAGNOSIS — R600 Localized edema: Secondary | ICD-10-CM | POA: Diagnosis not present

## 2019-10-01 DIAGNOSIS — E1122 Type 2 diabetes mellitus with diabetic chronic kidney disease: Secondary | ICD-10-CM | POA: Insufficient documentation

## 2019-10-01 DIAGNOSIS — I213 ST elevation (STEMI) myocardial infarction of unspecified site: Secondary | ICD-10-CM | POA: Diagnosis not present

## 2019-10-01 DIAGNOSIS — E876 Hypokalemia: Secondary | ICD-10-CM | POA: Insufficient documentation

## 2019-10-01 DIAGNOSIS — R609 Edema, unspecified: Secondary | ICD-10-CM | POA: Diagnosis not present

## 2019-10-01 DIAGNOSIS — N186 End stage renal disease: Secondary | ICD-10-CM | POA: Diagnosis not present

## 2019-10-01 LAB — BRAIN NATRIURETIC PEPTIDE: B Natriuretic Peptide: 132.6 pg/mL — ABNORMAL HIGH (ref 0.0–100.0)

## 2019-10-01 LAB — COMPREHENSIVE METABOLIC PANEL
ALT: 18 U/L (ref 0–44)
AST: 44 U/L — ABNORMAL HIGH (ref 15–41)
Albumin: 2.5 g/dL — ABNORMAL LOW (ref 3.5–5.0)
Alkaline Phosphatase: 57 U/L (ref 38–126)
Anion gap: 12 (ref 5–15)
BUN: 72 mg/dL — ABNORMAL HIGH (ref 6–20)
CO2: 22 mmol/L (ref 22–32)
Calcium: 8.5 mg/dL — ABNORMAL LOW (ref 8.9–10.3)
Chloride: 101 mmol/L (ref 98–111)
Creatinine, Ser: 5.01 mg/dL — ABNORMAL HIGH (ref 0.61–1.24)
GFR calc Af Amer: 14 mL/min — ABNORMAL LOW (ref >60)
GFR calc non Af Amer: 12 mL/min — ABNORMAL LOW (ref >60)
Glucose, Bld: 130 mg/dL — ABNORMAL HIGH (ref 70–99)
Potassium: 6.2 mmol/L — ABNORMAL HIGH (ref 3.5–5.1)
Sodium: 135 mmol/L (ref 135–145)
Total Bilirubin: 1.6 mg/dL — ABNORMAL HIGH (ref 0.3–1.2)
Total Protein: 6.9 g/dL (ref 6.5–8.1)

## 2019-10-01 LAB — CBC WITH DIFFERENTIAL/PLATELET
Abs Immature Granulocytes: 0.12 10*3/uL — ABNORMAL HIGH (ref 0.00–0.07)
Basophils Absolute: 0 10*3/uL (ref 0.0–0.1)
Basophils Relative: 0 %
Eosinophils Absolute: 0.5 10*3/uL (ref 0.0–0.5)
Eosinophils Relative: 4 %
HCT: 27.3 % — ABNORMAL LOW (ref 39.0–52.0)
Hemoglobin: 8.2 g/dL — ABNORMAL LOW (ref 13.0–17.0)
Immature Granulocytes: 1 %
Lymphocytes Relative: 5 %
Lymphs Abs: 0.6 10*3/uL — ABNORMAL LOW (ref 0.7–4.0)
MCH: 26.6 pg (ref 26.0–34.0)
MCHC: 30 g/dL (ref 30.0–36.0)
MCV: 88.6 fL (ref 80.0–100.0)
Monocytes Absolute: 1.3 10*3/uL — ABNORMAL HIGH (ref 0.1–1.0)
Monocytes Relative: 10 %
Neutro Abs: 10.4 10*3/uL — ABNORMAL HIGH (ref 1.7–7.7)
Neutrophils Relative %: 80 %
Platelets: 330 10*3/uL (ref 150–400)
RBC: 3.08 MIL/uL — ABNORMAL LOW (ref 4.22–5.81)
RDW: 17.5 % — ABNORMAL HIGH (ref 11.5–15.5)
WBC: 12.9 10*3/uL — ABNORMAL HIGH (ref 4.0–10.5)
nRBC: 0 % (ref 0.0–0.2)

## 2019-10-01 MED ORDER — SODIUM ZIRCONIUM CYCLOSILICATE 10 G PO PACK
10.0000 g | PACK | Freq: Once | ORAL | Status: AC
  Administered 2019-10-01: 10 g via ORAL
  Filled 2019-10-01: qty 1

## 2019-10-01 MED ORDER — HYDROMORPHONE HCL 1 MG/ML IJ SOLN
1.0000 mg | Freq: Once | INTRAMUSCULAR | Status: AC
  Administered 2019-10-01: 1 mg via INTRAVENOUS
  Filled 2019-10-01: qty 1

## 2019-10-01 MED ORDER — FUROSEMIDE 10 MG/ML IJ SOLN
80.0000 mg | Freq: Once | INTRAMUSCULAR | Status: AC
  Administered 2019-10-01: 80 mg via INTRAVENOUS
  Filled 2019-10-01: qty 8

## 2019-10-01 MED ORDER — LOKELMA 10 G PO PACK: 10.0000 g | PACK | Freq: Every day | ORAL | 0 refills | Status: AC

## 2019-10-01 NOTE — ED Notes (Signed)
Pt states his back pain is worsening, requesting pain meds

## 2019-10-01 NOTE — ED Notes (Signed)
Patient verbalizes understanding of discharge instructions. Opportunity for questioning and answers were provided. Armband removed by staff, pt discharged from ED via wheelchair to friends vehicle

## 2019-10-01 NOTE — Discharge Instructions (Signed)
Your potassium is elevated today.  You are being prescribed Lokelma, you were given your first dose today.  You need to take this once per day until you see your primary care physician and get a recheck of your potassium.  This needs to happen in the next 2-3 days.

## 2019-10-01 NOTE — ED Provider Notes (Signed)
[facility] EMERGENCY DEPARTMENT Provider Note   CSN: [id number] Arrival date & time: 10/01/19  1610     History Chief Complaint  Patient presents with  . Leg Pain    bilat    Jeffery [name] is a 59 y.o. male.  HPI 59 year old male presents with severe low back pain and bilateral leg swelling and blistering. He was admitted to the hospital for about 3 weeks and discharged less than 1 week ago. Home health nurse checked on him today and because of the blistering on his legs and weeping they were concerned that he needed to be reevaluated in the hospital. Patient notes he has chronic low back pain from spinal stenosis and after dropping his shorts he developed sudden onset worsening of this pain. It is not radicular and there is no incontinence or weakness/numbness. He denies any fevers. He states the drainage from his legs is clear. No shortness of breath except when the pain in his back activates. No chest pain. He has been ambulatory.   Past Medical History:  Diagnosis Date  . Atrial fibrillation ([place])   . Cellulitis and abscess of foot 07/2019   RIGHT FOOT  . Charcot's joint of right foot   . DDD (degenerative disc disease), lumbar   . Diabetes mellitus without complication ([place])   . Fatty liver   . Hypertension   . Obesity   . Rotator cuff disorder   . Shoulder impingement, right   . Spinal stenosis at L4-L5 level     Patient Active Problem List   Diagnosis Date Noted  . Venous stasis   . Acute pain of left knee   . Left leg cellulitis 09/07/2019  . Sepsis ([place]) 09/07/2019  . Right foot ulcer, with fat layer exposed ([place]) 08/08/2019  . Cellulitis of right lower extremity 08/08/2019  . Mixed diabetic hyperlipidemia associated with type 2 diabetes mellitus ([place]) 08/08/2019  . Severe sepsis with acute organ dysfunction due to methicillin susceptible Staphylococcus aureus (MSSA) ([place]) 08/08/2019  . AKI (acute kidney injury) ([place]) 08/08/2019  .  Sepsis due to methicillin susceptible Staphylococcus aureus (MSSA) with acute renal failure ([place]) 08/08/2019  . Persistent atrial fibrillation ([place])   . Testosterone deficiency 03/16/2018  . Chronic left shoulder pain 03/01/2018  . Body mass index 40.0-44.9, adult ([place]) 03/01/2018  . DDD (degenerative disc disease), lumbar 12/15/2017  . Chronic anticoagulation 10/26/2017  . Dyspnea 10/26/2017  . S/P right rotator cuff repair 04/05/2017  . Achilles tendon contracture, right 01/05/2017  . Closed nondisplaced fracture of distal phalanx of right great toe 01/05/2017  . Paroxysmal atrial fibrillation ([place]) 10/30/2016  . Pain in right ankle and joints of right foot 07/09/2016  . Uncontrolled type 2 diabetes mellitus with diabetic polyneuropathy, without long-term current use of insulin ([place]) 07/09/2016  . 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 Impingement syndrome of right shoulder 07/07/2016  . Morbid obesity ([place]) 06/05/2016  . S/P TKR (total knee replacement), right 03/28/2015  . Knee osteoarthritis 11/12/2014  . Essential hypertension 10/15/2014  . Cellulitis of left lower extremity   . Charcot foot due to diabetes mellitus ([place]) 10/01/2014  . Accelerated hypertension 10/01/2014  . Gangrene left third toe 10/01/2014    Past Surgical History:  Procedure Laterality Date  . AMPUTATION Left 10/02/2014   Procedure: Left Third toe amputation ;  Surgeon: [name], MD;  Location: [place];  Service: Orthopedics;  Laterality: Left;  Regular bed, wants  to follow hip  . ANTERIOR CRUCIATE LIGAMENT REPAIR Right 90   reconstruction  . APPLICATION OF WOUND VAC Left 10/02/2014   Procedure: APPLICATION OF WOUND VAC; toe Surgeon: [name], MD;  Location: [facility];  Service: Orthopedics;  Laterality: Left;  . CARDIOVERSION N/A 04/26/2019   Procedure: CARDIOVERSION;  Surgeon: Pixie Casino, MD;  Location: [facility] ENDOSCOPY;  Service: Cardiovascular;   Laterality: N/A;  . CARDIOVERSION N/A 05/18/2019   Procedure: CARDIOVERSION (CATH LAB);  Surgeon: Constance Haw, MD;  Location: [facility] CV LAB;  Service: Cardiovascular;  Laterality: N/A;  . I & D EXTREMITY Left 10/05/2014   Procedure: IRRIGATION AND DEBRIDEMENT LEFT FOOT;  Surgeon: [name], MD;  Location: [facility];  Service: Orthopedics;  Laterality: Left;  . INCISION AND DRAINAGE Right 08/09/2019   Procedure: INCISION AND DRAINAGE RIGHT FOOT;  Surgeon: Trula Slade, DPM;  Location: [facility];  Service: Podiatry;  Laterality: Right;  . KNEE ARTHROSCOPY W/ ACL RECONSTRUCTION Right   . TOTAL KNEE ARTHROPLASTY Right 03/28/2015  . TOTAL KNEE ARTHROPLASTY Right 03/28/2015   Procedure: RIGHT TOTAL KNEE ARTHROPLASTY;  Surgeon: [name], MD;  Location: [facility];  Service: Orthopedics;  Laterality: Right;  . WOUND DEBRIDEMENT Right 08/14/2019   Procedure: DEBRIDEMENT WOUND;  Surgeon: Trula Slade, DPM;  Location: [facility];  Service: Podiatry;  Laterality: Right;       Family History  Problem Relation Age of Onset  . Diabetes Father   . Hypertension Father   . Heart failure Father     Social History   Tobacco Use  . Smoking status: Former Smoker    Packs/day: 0.00    Years: 38.00    Pack years: 0.00    Types: Cigarettes  . Smokeless tobacco: Never Used  . Tobacco comment: QUIT 10/2016  Vaping Use  . Vaping Use: Never used  Substance Use Topics  . Alcohol use: No    Alcohol/week: 5.0 - 6.0 standard drinks    Types: 5 - 6 Cans of beer per week  . Drug use: Yes    Types: Marijuana    Comment: last week ; denies 03/24/17    Home Medications Prior to Admission medications   Medication Sig Start Date End Date Taking? Authorizing Provider  Accu-Chek FastClix Lancets MISC Use as directed to check blood sugar at least twice daily. E11.8 E11.65 Z79.4 Patient taking differently: 1 each by Other route See admin instructions. Use as directed to check blood sugar at least twice  daily. E11.8 E11.65 Z79.4 10/18/18   [name], MD  amiodarone (PACERONE) 200 MG tablet Take 200 mg by mouth 2 (two) times daily.     [provider]  amLODipine (NORVASC) 10 MG tablet Take 1 tablet (10 mg total) by mouth daily. 09/29/19   Shelly Coss, MD  apixaban (ELIQUIS) 5 MG TABS tablet Take 1 tablet (5 mg total) by mouth 2 (two) times daily. 06/20/19   [name], MD  atorvastatin (LIPITOR) 20 MG tablet Take 1 tablet (20 mg total) by mouth daily. 03/28/19   [name], MD  Blood Glucose Monitoring Suppl (ACCU-CHEK AVIVA) device Use as instructed to check blood sugar 2 times daily. E11.8 E11.65 Z79.4 Patient taking differently: 1 each by Other route See admin instructions. Use as instructed to check blood sugar 2 times daily. E11.8 E11.65 Z79.4 10/18/18 10/18/19  [name], MD  furosemide (LASIX) 80 MG tablet Take 1 tablet (80 mg total) by mouth 2 (two) times  daily. 09/28/19   Shelly Coss, MD  gabapentin (NEURONTIN) 300 MG capsule Take 1 capsule (300 mg total) by mouth 2 (two) times daily. Patient taking differently: Take 600 mg by mouth at bedtime as needed (pain).  03/28/19   [name], MD  glucose blood (ACCU-CHEK AVIVA) test strip Use as instructed to check blood sugar 2 times daily. E11.8 E11.65 Z79.4 Patient taking differently: 1 each by Other route See admin instructions. Use as instructed to check blood sugar 2 times daily. E11.8 E11.65 Z79.4 10/18/18   [name], MD  hydrALAZINE (APRESOLINE) 50 MG tablet Take 1 tablet (50 mg total) by mouth every 8 (eight) hours. 09/28/19   Shelly Coss, MD  insulin glargine (LANTUS SOLOSTAR) 100 UNIT/ML Solostar Pen Inject 50 Units into the skin 2 (two) times daily. Patient taking differently: Inject 51 Units into the skin 2 (two) times daily.  06/20/19   [name], MD  insulin lispro (INSULIN LISPRO) 100 UNIT/ML KwikPen Junior Inject 0.08 mLs (8 Units total) into the skin 3 (three) times daily. Patient  taking differently: Inject 8 Units into the skin 3 (three) times daily as needed (Low blood sugar).  03/28/19   [name], MD  Insulin Pen Needle (B-D ULTRAFINE III SHORT PEN) 31G X 8 MM MISC 1 each by Does not apply route 3 (three) times daily. 10/12/17   [name], MD  Insulin Syringe-Needle U-100 (TRUEPLUS INSULIN SYRINGE) 30G X 5/16" 0.5 ML MISC Use as directed 3 times daily Patient taking differently: 1 each by Other route See admin instructions. Use as directed 3 times daily 10/06/17   [name], MD  liraglutide (VICTOZA) 18 MG/3ML SOPN Inject 0.3 mLs (1.8 mg total) into the skin daily. Patient not taking: Reported on 09/07/2019 06/20/19   [name], MD  oxyCODONE-acetaminophen (PERCOCET) 10-325 MG tablet Take 1 tablet by mouth every 4 (four) hours as needed for pain.     [provider]  polyethylene glycol (MIRALAX / GLYCOLAX) 17 g packet Take 17 g by mouth daily as needed for mild constipation. 08/16/19   Nita Sells, MD  Sennosides (EX-LAX) 15 MG TABS Take 15 mg by mouth daily as needed (Constipation).    [provider]  sodium chloride (OCEAN) 0.65 % SOLN nasal spray Place 1 spray into both nostrils daily as needed for congestion.     [provider]  sodium zirconium cyclosilicate (LOKELMA) 10 g PACK packet Take 10 g by mouth daily for 3 days. 10/02/19 10/05/19  Sherwood Gambler, MD  TRUEPLUS INSULIN SYRINGE 30G X 5/16" 0.5 ML MISC USE AS DIRECTED 3 TIMES DAILY Patient taking differently: 1 each by Other route in the morning, at noon, and at bedtime.  04/03/16   Tresa Garter, MD    Allergies    Metformin and related  Review of Systems   Review of Systems  Constitutional: Negative for fever.  Respiratory: Negative for shortness of breath.   Cardiovascular: Positive for leg swelling. Negative for chest pain.  Gastrointestinal: Negative for abdominal pain.  Genitourinary:       No incontinence  Musculoskeletal: Positive for  back pain.  Skin: Positive for wound.  All other systems reviewed and are negative.   Physical Exam Updated Vital Signs BP (!) 143/55   Pulse 84   Temp 98.2 F (36.8 C) (Oral)   Resp (!) 9   Ht 6\' 2"  (1.88 m)   Wt (!) 165.4 kg   SpO2 95%   BMI 46.82 kg/m  Physical Exam Vitals and nursing note reviewed.  Constitutional:      General: He is not in acute distress.    Appearance: He is well-developed. He is obese. He is not ill-appearing or diaphoretic.  HENT:     Head: Normocephalic and atraumatic.     Right Ear: External ear normal.     Left Ear: External ear normal.     Nose: Nose normal.  Eyes:     General:        Right eye: No discharge.        Left eye: No discharge.  Cardiovascular:     Rate and Rhythm: Normal rate and regular rhythm.     Heart sounds: Normal heart sounds.  Pulmonary:     Effort: Pulmonary effort is normal.     Breath sounds: Normal breath sounds. No wheezing, rhonchi or rales.  Abdominal:     Palpations: Abdomen is soft.     Tenderness: There is no abdominal tenderness.  Musculoskeletal:     Cervical back: Neck supple.     Lumbar back: Tenderness present.     Comments: Diffuse swelling to BLE. Multiple blisters. Generalized weeping.  Skin:    General: Skin is warm and dry.  Neurological:     Mental Status: He is alert.  Psychiatric:        Mood and Affect: Mood is not anxious.     ED Results / Procedures / Treatments   Labs (all labs ordered are listed, but only abnormal results are displayed) Labs Reviewed  COMPREHENSIVE METABOLIC PANEL - Abnormal; Notable for the following components:      Result Value   Potassium 6.2 (*)    Glucose, Bld 130 (*)    BUN 72 (*)    Creatinine, Ser 5.01 (*)    Calcium 8.5 (*)    Albumin 2.5 (*)    AST 44 (*)    Total Bilirubin 1.6 (*)    GFR calc non Af Amer 12 (*)    GFR calc Af Amer 14 (*)    All other components within normal limits  CBC WITH DIFFERENTIAL/PLATELET - Abnormal; Notable for  the following components:   WBC 12.9 (*)    RBC 3.08 (*)    Hemoglobin 8.2 (*)    HCT 27.3 (*)    RDW 17.5 (*)    Neutro Abs 10.4 (*)    Lymphs Abs 0.6 (*)    Monocytes Absolute 1.3 (*)    Abs Immature Granulocytes 0.12 (*)    All other components within normal limits  BRAIN NATRIURETIC PEPTIDE - Abnormal; Notable for the following components:   B Natriuretic Peptide 132.6 (*)    All other components within normal limits  URINALYSIS, ROUTINE W REFLEX MICROSCOPIC    EKG EKG Interpretation  Date/Time:  Sunday October 01 2019 17:59:54 EDT Ventricular Rate:  90 PR Interval:    QRS Duration: 119 QT Interval:  390 QTC Calculation: 478 R Axis:   68 Text Interpretation: Sinus rhythm Nonspecific intraventricular conduction delay Low voltage, precordial leads Confirmed by Sherwood Gambler [id number]) on 10/01/2019 6:31:50 PM   Radiology DG Chest 2 View  Result Date: 10/01/2019 CLINICAL DATA:  59 year old male with shortness of breath and leg swelling. EXAM: CHEST - 2 VIEW COMPARISON:  Chest radiograph dated 09/15/2019. FINDINGS: There is borderline cardiomegaly with mild vascular congestion similar to prior radiograph. No focal consolidation, pleural effusion, or pneumothorax. No acute osseous pathology. Degenerative changes. IMPRESSION: Borderline cardiomegaly with mild vascular congestion. No  significant interval change. Electronically Signed   By: Anner Crete M.D.   On: 10/01/2019 17:32    Procedures Procedures (including critical care time)  Medications Ordered in ED Medications  HYDROmorphone (DILAUDID) injection 1 mg (1 mg Intravenous Given 10/01/19 1810)  furosemide (LASIX) injection 80 mg (80 mg Intravenous Given 10/01/19 1838)  sodium zirconium cyclosilicate (LOKELMA) packet 10 g (10 g Oral Given 10/01/19 1949)    ED Course  I have reviewed the triage vital signs and the nursing notes.  Pertinent labs & imaging results that were available during my care of the patient were  reviewed by me and considered in my medical decision making (see chart for details).    MDM Rules/Calculators/A&P                          On exam, does have impressive swelling but chart review indicates this is fairly chronic with his recent admission.  I do not see anything that is overtly infectious and his elevated WBC is near baseline.  Will give a dose of Lasix given his chronic fluid overload.  He is not in distress.  His potassium is 6.2 and I discussed with Dr. Jonnie Finner.  He recommends Lokelma 10 g/day and he needs to have repeat potassium in 2 to 3 days by his PCP.  Patient feels like he can do this.  Otherwise, his chronic back pain seems to be acutely inflamed but no emergent findings on history/exam  Will discharge. Final Clinical Impression(s) / ED Diagnoses Final diagnoses:  Hyperkalemia  Chronic kidney disease, unspecified CKD stage  Bilateral lower extremity edema    Rx / DC Orders ED Discharge Orders         Ordered    sodium zirconium cyclosilicate (LOKELMA) 10 g PACK packet  Daily     Discontinue  Reprint     10/01/19 Eugene Garnet, MD 10/01/19 2043

## 2019-10-01 NOTE — ED Notes (Signed)
Pt back from X-ray.  

## 2019-10-01 NOTE — ED Triage Notes (Signed)
Pt bib [facility] from home. Wound care RN noting edema and weeping on bilat legs. Pt requested to go to [facility], but ems brought him here because of hx of CHF. Hx of afib/controlled. Pt has spinal stenosis and goes to pain clinic. Pt does not use/have oxygen at home and ambulated for ems, DOE. EMS placed pt on 2lpm for comfort. Hx DM cbg 148, 186/106, hr 94.

## 2019-10-03 ENCOUNTER — Telehealth: Payer: Self-pay | Admitting: Family Medicine

## 2019-10-03 ENCOUNTER — Telehealth: Payer: Self-pay

## 2019-10-03 ENCOUNTER — Encounter (HOSPITAL_COMMUNITY): Payer: Self-pay | Admitting: Nephrology

## 2019-10-03 DIAGNOSIS — S81802A Unspecified open wound, left lower leg, initial encounter: Secondary | ICD-10-CM

## 2019-10-03 LAB — SURGICAL PATHOLOGY

## 2019-10-03 MED ORDER — MISC. DEVICES MISC
0 refills | Status: AC
Start: 2019-10-03 — End: ?

## 2019-10-03 NOTE — Telephone Encounter (Signed)
  From the discharge call.  He has an appt with Dr Margarita Rana 10/11/2019.     he stated that he is not doing well and has multiple concerns. He is concerned that his metoprolol was discontinued and is worried that his BP will become elevated even though he has 2 new meds that he has been taking for his BP.   His is complaining of left knee pain and swelling that is making it very difficult to walk.  He has a walker to use with ambulation. He also now is reporting back pain.  He noted that he needs to call Dr Erlinda Hong to schedule a follow up appointment.   He is requesting an order for an extra large bedside commode so he does not have to navigate the stairs each time he needs to use the bathroom.   He is completing paperwork for the nephrologist and he said when that is completed he will call their office to see if he schedule an appointment before 10/16/2019,  He noted that he is waiting for results of a kidney biopsy.    He stated that the leg wounds are weeping through the dressings and they will be changed tomorrow when he goes to the podiatrist. Home health was ordered. He said that the nurse from [facility] came to see him 10/01/2019 and sent him to the ED due to his weeping legs and elevated BP.   Has glucometer.  Blood sugar today [facility] f/u appt confirmed? .podiatry and pain management - 7/7/201; cardiology - 10/12/2019; nephrology - 10/16/2019; wound care center - 10/23/2019

## 2019-10-03 NOTE — Telephone Encounter (Signed)
Patient needs a referral for wound care.

## 2019-10-03 NOTE — Telephone Encounter (Signed)
Prescription for bedside commode has been written

## 2019-10-03 NOTE — Telephone Encounter (Signed)
Jeffery [name], with [facility], requesting woundcare for x3 weeks and orders to wrap both legs. She also stated that patient would benefit from referral for woundcare.    CB

## 2019-10-03 NOTE — Telephone Encounter (Signed)
Transition Care Management Follow-up Telephone Call  Date of discharge and from where: 09/28/2019, [facility]   How have you been since you were released from the hospital?he stated that he is not doing well and has multiple concerns. He is concerned that his metoprolol was discontinued and is worried that his BP will become elevated even though he has 2 new meds that he has been taking for his BP.  His is complaining of left knee pain and swelling that is making it very difficult to walk.  He has a walker to use with ambulation. He also now is reporting back pain.  He noted that he needs to call Dr Erlinda Hong to schedule a follow up appointment.  He is requesting an order for an extra large bedside commode so he does not have to navigate the stairs each time he needs to use the bathroom.  He is completing paperwork for the nephrologist and he said when that is completed he will call their office to see if he schedule an appointment before 10/16/2019,  He noted that he is waiting for results of a kidney biopsy.   He stated that the leg wounds are weeping through the dressings and they will be changed tomorrow when he goes to the podiatrist.    Any questions or concerns? Noted above.  Items Reviewed:  Did the pt receive and understand the discharge instructions provided?  yes  Medications obtained and verified?  he said that he has all medications, including the new ones,  and did not have any questions at this time   Any new allergies since your discharge?  none reported   Do you have support at home?  his mother  Home health was ordered. He said that the nurse from [facility] came to see him 10/01/2019 and sent him to the ED due to his weeping legs and elevated BP.  Has glucometer.  Blood sugar today 125  Functional Questionnaire: (I = Independent and D = Dependent) ADLs: he is trying to be as independent as possible.   Follow up appointments reviewed:   [facility] Hospital f/u appt  confirmed? Appointment scheduled with Dr Margarita Rana 10/11/2019  [facility] f/u appt confirmed? .podiatry and pain management - 7/7/201; cardiology - 10/12/2019; nephrology - 10/16/2019; wound care center - 10/23/2019.   Are transportation arrangements needed?  no, his mother drives  If their condition worsens, is the pt aware to call PCP or go to the Emergency Dept.? yes  Was the patient provided with contact information for the PCP's office or ED? he has the phone number for the clinic  Was to pt encouraged to call back with questions or concerns?  yes

## 2019-10-03 NOTE — Telephone Encounter (Signed)
Please see below.

## 2019-10-04 ENCOUNTER — Encounter (HOSPITAL_COMMUNITY): Payer: Self-pay | Admitting: Nephrology

## 2019-10-04 ENCOUNTER — Ambulatory Visit (INDEPENDENT_AMBULATORY_CARE_PROVIDER_SITE_OTHER): Payer: Medicaid Other | Admitting: Podiatry

## 2019-10-04 ENCOUNTER — Other Ambulatory Visit: Payer: Self-pay

## 2019-10-04 DIAGNOSIS — M25511 Pain in right shoulder: Secondary | ICD-10-CM | POA: Diagnosis not present

## 2019-10-04 DIAGNOSIS — L97512 Non-pressure chronic ulcer of other part of right foot with fat layer exposed: Secondary | ICD-10-CM

## 2019-10-04 DIAGNOSIS — E11621 Type 2 diabetes mellitus with foot ulcer: Secondary | ICD-10-CM

## 2019-10-04 DIAGNOSIS — E1143 Type 2 diabetes mellitus with diabetic autonomic (poly)neuropathy: Secondary | ICD-10-CM | POA: Diagnosis not present

## 2019-10-04 DIAGNOSIS — I83009 Varicose veins of unspecified lower extremity with ulcer of unspecified site: Secondary | ICD-10-CM

## 2019-10-04 DIAGNOSIS — M7989 Other specified soft tissue disorders: Secondary | ICD-10-CM

## 2019-10-04 DIAGNOSIS — M25512 Pain in left shoulder: Secondary | ICD-10-CM | POA: Diagnosis not present

## 2019-10-04 DIAGNOSIS — M25552 Pain in left hip: Secondary | ICD-10-CM | POA: Diagnosis not present

## 2019-10-04 DIAGNOSIS — M545 Low back pain: Secondary | ICD-10-CM | POA: Diagnosis not present

## 2019-10-04 DIAGNOSIS — M25571 Pain in right ankle and joints of right foot: Secondary | ICD-10-CM | POA: Diagnosis not present

## 2019-10-04 DIAGNOSIS — G894 Chronic pain syndrome: Secondary | ICD-10-CM | POA: Diagnosis not present

## 2019-10-04 DIAGNOSIS — M542 Cervicalgia: Secondary | ICD-10-CM | POA: Diagnosis not present

## 2019-10-04 DIAGNOSIS — L97909 Non-pressure chronic ulcer of unspecified part of unspecified lower leg with unspecified severity: Secondary | ICD-10-CM

## 2019-10-04 DIAGNOSIS — Z6841 Body Mass Index (BMI) 40.0 and over, adult: Secondary | ICD-10-CM | POA: Diagnosis not present

## 2019-10-04 DIAGNOSIS — M5412 Radiculopathy, cervical region: Secondary | ICD-10-CM | POA: Diagnosis not present

## 2019-10-04 NOTE — Telephone Encounter (Signed)
Patient was called and informed of referral being placed to wound care

## 2019-10-04 NOTE — Telephone Encounter (Signed)
Referral has been placed. 

## 2019-10-05 ENCOUNTER — Encounter: Payer: Self-pay | Admitting: Podiatry

## 2019-10-05 MED ORDER — SANTYL 250 UNIT/GM EX OINT: 1.0000 "application " | TOPICAL_OINTMENT | Freq: Every day | CUTANEOUS | 0 refills | Status: AC

## 2019-10-05 NOTE — Progress Notes (Signed)
Subjective:  Patient ID: Jeffery [name], male    DOB: 09/26/1960,  MRN: [id number]  Chief Complaint  Patient presents with  . Wound Check    pt is here for a wound check of the right foot    59 y.o. male presents with the above complaint.  Patient presents with bilateral severe lymphedema secondary to being fluid overloaded with multiple blister formation with serous drainage associated with it.  Patient has a history with Dr. Earleen Newport who has been treating the wound on the right lateral foot which is mostly fibrotic in nature.  He has not been seen at the wound care center yet.  He has not done anything to help decrease the edema that is present in both of his lower legs.  He has not been able to control the fluid that is being drained out of his skin.  He has failed Unna boot therapy multiple times.  He has not started a santyl therapy to the right lateral foot wound.  He has been keeping it general dressings on the foot.  He denies any other acute complaints.   Review of Systems: Negative except as noted in the HPI. Denies N/V/F/Ch.  Past Medical History:  Diagnosis Date  . Atrial fibrillation ([place])   . Cellulitis and abscess of foot 07/2019   RIGHT FOOT  . Charcot's joint of right foot   . DDD (degenerative disc disease), lumbar   . Diabetes mellitus without complication ([place])   . Fatty liver   . Hypertension   . Obesity   . Rotator cuff disorder   . Shoulder impingement, right   . Spinal stenosis at L4-L5 level     Current Outpatient Medications:  .  Accu-Chek FastClix Lancets MISC, Use as directed to check blood sugar at least twice daily. E11.8 E11.65 Z79.4 (Patient taking differently: 1 each by Other route See admin instructions. Use as directed to check blood sugar at least twice daily. E11.8 E11.65 Z79.4), Disp: 102 each, Rfl: 11 .  amiodarone (PACERONE) 200 MG tablet, Take 200 mg by mouth 2 (two) times daily. , Disp: , Rfl:  .  amLODipine (NORVASC) 10 MG tablet, Take 1  tablet (10 mg total) by mouth daily., Disp: 30 tablet, Rfl: 1 .  apixaban (ELIQUIS) 5 MG TABS tablet, Take 1 tablet (5 mg total) by mouth 2 (two) times daily., Disp: 60 tablet, Rfl: 6 .  atorvastatin (LIPITOR) 20 MG tablet, Take 1 tablet (20 mg total) by mouth daily., Disp: 90 tablet, Rfl: 1 .  Blood Glucose Monitoring Suppl (ACCU-CHEK AVIVA) device, Use as instructed to check blood sugar 2 times daily. E11.8 E11.65 Z79.4 (Patient taking differently: 1 each by Other route See admin instructions. Use as instructed to check blood sugar 2 times daily. E11.8 E11.65 Z79.4), Disp: 1 each, Rfl: 0 .  furosemide (LASIX) 80 MG tablet, Take 1 tablet (80 mg total) by mouth 2 (two) times daily., Disp: 60 tablet, Rfl: 1 .  gabapentin (NEURONTIN) 300 MG capsule, Take 1 capsule (300 mg total) by mouth 2 (two) times daily. (Patient taking differently: Take 600 mg by mouth at bedtime as needed (pain). ), Disp: 180 capsule, Rfl: 1 .  glucose blood (ACCU-CHEK AVIVA) test strip, Use as instructed to check blood sugar 2 times daily. E11.8 E11.65 Z79.4 (Patient taking differently: 1 each by Other route See admin instructions. Use as instructed to check blood sugar 2 times daily. E11.8 E11.65 Z79.4), Disp: 100 each, Rfl: 12 .  hydrALAZINE (APRESOLINE) 50  MG tablet, Take 1 tablet (50 mg total) by mouth every 8 (eight) hours., Disp: 90 tablet, Rfl: 1 .  insulin glargine (LANTUS SOLOSTAR) 100 UNIT/ML Solostar Pen, Inject 50 Units into the skin 2 (two) times daily. (Patient taking differently: Inject 51 Units into the skin 2 (two) times daily. ), Disp: 30 mL, Rfl: 6 .  insulin lispro (INSULIN LISPRO) 100 UNIT/ML KwikPen Junior, Inject 0.08 mLs (8 Units total) into the skin 3 (three) times daily. (Patient taking differently: Inject 8 Units into the skin 3 (three) times daily as needed (Low blood sugar). ), Disp: 30 mL, Rfl: 6 .  Insulin Pen Needle (B-D ULTRAFINE III SHORT PEN) 31G X 8 MM MISC, 1 each by Does not apply route 3 (three)  times daily., Disp: 100 each, Rfl: 5 .  Insulin Syringe-Needle U-100 (TRUEPLUS INSULIN SYRINGE) 30G X 5/16" 0.5 ML MISC, Use as directed 3 times daily (Patient taking differently: 1 each by Other route See admin instructions. Use as directed 3 times daily), Disp: 100 each, Rfl: 5 .  liraglutide (VICTOZA) 18 MG/3ML SOPN, Inject 0.3 mLs (1.8 mg total) into the skin daily., Disp: 30 mL, Rfl: 6 .  Misc. Devices MISC, Extra-large bedside commode. Dx left leg cellulitis, Disp: 1 each, Rfl: 0 .  oxyCODONE-acetaminophen (PERCOCET) 10-325 MG tablet, Take 1 tablet by mouth every 4 (four) hours as needed for pain. , Disp: , Rfl:  .  polyethylene glycol (MIRALAX / GLYCOLAX) 17 g packet, Take 17 g by mouth daily as needed for mild constipation., Disp: 14 each, Rfl: 0 .  Sennosides (EX-LAX) 15 MG TABS, Take 15 mg by mouth daily as needed (Constipation)., Disp: , Rfl:  .  sodium chloride (OCEAN) 0.65 % SOLN nasal spray, Place 1 spray into both nostrils daily as needed for congestion. , Disp: , Rfl:  .  sodium zirconium cyclosilicate (LOKELMA) 10 g PACK packet, Take 10 g by mouth daily for 3 days., Disp: 3 packet, Rfl: 0 .  TRUEPLUS INSULIN SYRINGE 30G X 5/16" 0.5 ML MISC, USE AS DIRECTED 3 TIMES DAILY (Patient taking differently: 1 each by Other route in the morning, at noon, and at bedtime. ), Disp: 100 each, Rfl: 0 .  collagenase (SANTYL) ointment, Apply 1 application topically daily., Disp: 15 g, Rfl: 0  Social History   Tobacco Use  Smoking Status Former Smoker  . Packs/day: 0.00  . Years: 38.00  . Pack years: 0.00  . Types: Cigarettes  Smokeless Tobacco Never Used  Tobacco Comment   QUIT 10/2016    Allergies  Allergen Reactions  . Metformin And Related Other (See Comments)    GI upset   Objective:  There were no vitals filed for this visit. There is no height or weight on file to calculate BMI. Constitutional Well developed. Well nourished.  Vascular Dorsalis pedis pulses palpable  bilaterally. Posterior tibial pulses palpable bilaterally. Capillary refill normal to all digits.  No cyanosis or clubbing noted. Pedal hair growth normal.  Neurologic Normal speech. Oriented to person, place, and time. Epicritic sensation to light touch grossly present bilaterally.  Dermatologic  multiple loose serous blisters noted across both of the lower extremity secondary to fluid overload from severe edema to bilateral lower extremity.  All the blisters were drained without any acute complication.  No purulent drainage noted.  All blisters were primarily serous filled fluid.  No cellulitis noted.  Right lateral foot wound with fibrotic base.  No clinical signs of infection noted.  No exposure of  tendon or bone noted.  No malodor present.  No crepitus noted.  Orthopedic: Normal joint ROM without pain or crepitus bilaterally. No visible deformities. No bony tenderness.   Radiographs: None Assessment:   1. Diabetic ulcer of right foot associated with type 2 diabetes mellitus, with fat layer exposed, unspecified part of foot ([place])   2. Venous ulcer ([place])   3. Leg swelling    Plan:  Patient was evaluated and treated and all questions answered.  Right lateral foot wound with fibrotic base no clinical signs of infection -Patient will benefit from aggressive santyl therapy to help breakdown the fibrotic tissue and allow for granulation of red beefy tissue.  He will continue to ambulate in a cam boot to allow for proper offloading against the wound. -He will be rescheduled to see wound care center as soon as possible.  He was not able to go to the last appointment as patient was hospitalized for cellulitis -Center was sent to pharmacy and I have asked the patient to apply it daily.  I discussed with him the importance of maintaining wet-to-dry dressing changes with Santyl. -Patient has failed to get home health to come to his house more than once a week to help manage wound. -He has an  appointment to be seen at the wound care center on 10/23/19 -The wounds were dressed with Xeroform ABD padsand Ace bandages.  Patient has failed many Unna boot therapies and therefore will like to hold off on Unna boot treatment for now.  No follow-ups on file.

## 2019-10-06 NOTE — Telephone Encounter (Signed)
Order for bedside commode has been faxed to [facility]

## 2019-10-09 NOTE — Telephone Encounter (Signed)
I also called the patient on 7/2 to see how he was  and give instructions. Left VM

## 2019-10-10 ENCOUNTER — Encounter: Payer: Self-pay | Admitting: Orthopaedic Surgery

## 2019-10-10 ENCOUNTER — Ambulatory Visit (INDEPENDENT_AMBULATORY_CARE_PROVIDER_SITE_OTHER): Payer: Medicaid Other | Admitting: Orthopaedic Surgery

## 2019-10-10 VITALS — Ht 74.0 in | Wt 364.0 lb

## 2019-10-10 DIAGNOSIS — M109 Gout, unspecified: Secondary | ICD-10-CM

## 2019-10-10 NOTE — Progress Notes (Signed)
Office Visit Note   Patient: Jeffery [name]           Date of Birth: 08-01-1960           MRN: [id number] Visit Date: 10/10/2019              Requested by: [name], MD [place],  [place] [id number] PCP: [name], MD   Assessment & Plan: Visit Diagnoses:  1. Acute gout of left knee, unspecified cause     Plan: Impression is left knee gouty flareup with underlying left lower extremity cellulitis and lymphedema.  The patient has requested a knee immobilizer today and we have provided this.  He is medically complex so we do not feel comfortable prescribing him medication for his gout at this point.  He will discuss this further with his primary care provider.  He will follow-up with [place] as needed.  Follow-Up Instructions: Return if symptoms worsen or fail to improve.   Orders:  No orders of the defined types were placed in this encounter.  No orders of the defined types were placed in this encounter.     Procedures: No procedures performed   Clinical Data: No additional findings.   Subjective: Chief Complaint  Patient presents with  . Left Knee - Follow-up    HPI Jeffery [name] is a 59 year old gentleman who comes in today for hospital follow-up.  He was hospitalized for left lower extremity cellulitis with subsequent sepsis.  While hospitalized, he developed acute left knee pain.  Left knee was aspirated which showed inflammatory arthropathy suggestive of gout.  He was on IV steroids and sent home with colchicine.  He comes in today for further evaluation.  He notes that the IV steroids and colchicine did not help.  He continues to have pain to the left lower extremity with significant lymphedema.  Both lower extremities are wrapped.  Review of Systems as detailed in HPI.  All others reviewed and are negative.   Objective: Vital Signs: Ht 6\' 2"  (1.88 m)   Wt (!) 364 lb (165.1 kg)   BMI 46.73 kg/m   Physical Exam well-developed and  well-nourished gentleman in no acute distress.  Alert and oriented x3.  Ortho Exam examination of the left lower extremity shows significant lymphedema.  Left lower leg is wrapped.  Range of motion of the left knee is from about 30 to 80 degrees.  No erythema.  No warmth.  Specialty Comments:  No specialty comments available.  Imaging: No new imaging   PMFS History: Patient Active Problem List   Diagnosis Date Noted  . Venous stasis   . Acute pain of left knee   . Left leg cellulitis 09/07/2019  . Sepsis ([place]) 09/07/2019  . Right foot ulcer, with fat layer exposed ([place]) 08/08/2019  . Cellulitis of right lower extremity 08/08/2019  . Mixed diabetic hyperlipidemia associated with type 2 diabetes mellitus ([place]) 08/08/2019  . Severe sepsis with acute organ dysfunction due to methicillin susceptible Staphylococcus aureus (MSSA) ([place]) 08/08/2019  . AKI (acute kidney injury) ([place]) 08/08/2019  . Sepsis due to methicillin susceptible Staphylococcus aureus (MSSA) with acute renal failure ([place]) 08/08/2019  . Persistent atrial fibrillation ([place])   . Testosterone deficiency 03/16/2018  . Chronic left shoulder pain 03/01/2018  . Body mass index 40.0-44.9, adult ([place]) 03/01/2018  . DDD (degenerative disc disease), lumbar 12/15/2017  . Chronic anticoagulation 10/26/2017  . Dyspnea 10/26/2017  . S/P right rotator cuff repair 04/05/2017  . Achilles tendon  contracture, right 01/05/2017  . Closed nondisplaced fracture of distal phalanx of right great toe 01/05/2017  . Paroxysmal atrial fibrillation ([place]) 10/30/2016  . Pain in right ankle and joints of right foot 07/09/2016  . Uncontrolled type 2 diabetes mellitus with diabetic polyneuropathy, without long-term current use of insulin ([place]) 07/09/2016  . 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 Impingement syndrome of right shoulder 07/07/2016  . Morbid obesity ([place]) 06/05/2016  . S/P TKR  (total knee replacement), right 03/28/2015  . Knee osteoarthritis 11/12/2014  . Essential hypertension 10/15/2014  . Cellulitis of left lower extremity   . Charcot foot due to diabetes mellitus ([place]) 10/01/2014  . Accelerated hypertension 10/01/2014  . Gangrene left third toe 10/01/2014   Past Medical History:  Diagnosis Date  . Atrial fibrillation ([place])   . Cellulitis and abscess of foot 07/2019   RIGHT FOOT  . Charcot's joint of right foot   . DDD (degenerative disc disease), lumbar   . Diabetes mellitus without complication ([place])   . Fatty liver   . Hypertension   . Obesity   . Rotator cuff disorder   . Shoulder impingement, right   . Spinal stenosis at L4-L5 level     Family History  Problem Relation Age of Onset  . Diabetes Father   . Hypertension Father   . Heart failure Father     Past Surgical History:  Procedure Laterality Date  . AMPUTATION Left 10/02/2014   Procedure: Left Third toe amputation ;  Surgeon: [name], MD;  Location: [facility];  Service: Orthopedics;  Laterality: Left;  Regular bed, wants to follow hip  . ANTERIOR CRUCIATE LIGAMENT REPAIR Right 90   reconstruction  . APPLICATION OF WOUND VAC Left 10/02/2014   Procedure: APPLICATION OF WOUND VAC; toe Surgeon: [name], MD;  Location: [facility];  Service: Orthopedics;  Laterality: Left;  . CARDIOVERSION N/A 04/26/2019   Procedure: CARDIOVERSION;  Surgeon: Pixie Casino, MD;  Location: [facility] ENDOSCOPY;  Service: Cardiovascular;  Laterality: N/A;  . CARDIOVERSION N/A 05/18/2019   Procedure: CARDIOVERSION (CATH LAB);  Surgeon: Constance Haw, MD;  Location: [facility] CV LAB;  Service: Cardiovascular;  Laterality: N/A;  . I & D EXTREMITY Left 10/05/2014   Procedure: IRRIGATION AND DEBRIDEMENT LEFT FOOT;  Surgeon: [name], MD;  Location: [facility];  Service: Orthopedics;  Laterality: Left;  . INCISION AND DRAINAGE Right 08/09/2019   Procedure: INCISION AND DRAINAGE RIGHT FOOT;  Surgeon: Trula Slade,  DPM;  Location: [facility];  Service: Podiatry;  Laterality: Right;  . KNEE ARTHROSCOPY W/ ACL RECONSTRUCTION Right   . TOTAL KNEE ARTHROPLASTY Right 03/28/2015  . TOTAL KNEE ARTHROPLASTY Right 03/28/2015   Procedure: RIGHT TOTAL KNEE ARTHROPLASTY;  Surgeon: [name], MD;  Location: [facility];  Service: Orthopedics;  Laterality: Right;  . WOUND DEBRIDEMENT Right 08/14/2019   Procedure: DEBRIDEMENT WOUND;  Surgeon: Trula Slade, DPM;  Location: [facility];  Service: Podiatry;  Laterality: Right;   Social History   Occupational History  . Occupation: Management consultant  Tobacco Use  . Smoking status: Former Smoker    Packs/day: 0.00    Years: 38.00    Pack years: 0.00    Types: Cigarettes  . Smokeless tobacco: Never Used  . Tobacco comment: QUIT 10/2016  Vaping Use  . Vaping Use: Never used  Substance and Sexual Activity  . Alcohol use: No    Alcohol/week: 5.0 -  6.0 standard drinks    Types: 5 - 6 Cans of beer per week  . Drug use: Yes    Types: Marijuana    Comment: last week ; denies 03/24/17  . Sexual activity: Not on file

## 2019-10-11 ENCOUNTER — Encounter: Payer: Self-pay | Admitting: Family Medicine

## 2019-10-11 ENCOUNTER — Other Ambulatory Visit: Payer: Self-pay

## 2019-10-11 ENCOUNTER — Ambulatory Visit: Payer: Medicaid Other | Attending: Family Medicine | Admitting: Family Medicine

## 2019-10-11 VITALS — BP 129/64 | HR 83 | Ht 74.0 in | Wt 343.0 lb

## 2019-10-11 DIAGNOSIS — E039 Hypothyroidism, unspecified: Secondary | ICD-10-CM

## 2019-10-11 DIAGNOSIS — E1122 Type 2 diabetes mellitus with diabetic chronic kidney disease: Secondary | ICD-10-CM | POA: Diagnosis not present

## 2019-10-11 DIAGNOSIS — M10062 Idiopathic gout, left knee: Secondary | ICD-10-CM | POA: Diagnosis not present

## 2019-10-11 DIAGNOSIS — E785 Hyperlipidemia, unspecified: Secondary | ICD-10-CM | POA: Diagnosis not present

## 2019-10-11 DIAGNOSIS — E1159 Type 2 diabetes mellitus with other circulatory complications: Secondary | ICD-10-CM | POA: Diagnosis not present

## 2019-10-11 DIAGNOSIS — I10 Essential (primary) hypertension: Secondary | ICD-10-CM | POA: Diagnosis not present

## 2019-10-11 DIAGNOSIS — L03119 Cellulitis of unspecified part of limb: Secondary | ICD-10-CM

## 2019-10-11 DIAGNOSIS — E1169 Type 2 diabetes mellitus with other specified complication: Secondary | ICD-10-CM | POA: Diagnosis not present

## 2019-10-11 DIAGNOSIS — I4819 Other persistent atrial fibrillation: Secondary | ICD-10-CM | POA: Diagnosis not present

## 2019-10-11 DIAGNOSIS — Z794 Long term (current) use of insulin: Secondary | ICD-10-CM | POA: Diagnosis not present

## 2019-10-11 DIAGNOSIS — N185 Chronic kidney disease, stage 5: Secondary | ICD-10-CM | POA: Diagnosis not present

## 2019-10-11 LAB — GLUCOSE, POCT (MANUAL RESULT ENTRY): POC Glucose: 150 mg/dl — AB (ref 70–99)

## 2019-10-11 MED ORDER — ATORVASTATIN CALCIUM 20 MG PO TABS: 20.0000 mg | ORAL_TABLET | Freq: Every day | ORAL | 1 refills | Status: DC

## 2019-10-11 MED ORDER — LANTUS SOLOSTAR 100 UNIT/ML ~~LOC~~ SOPN: 40.0000 [IU] | PEN_INJECTOR | Freq: Two times a day (BID) | SUBCUTANEOUS | 6 refills | Status: DC

## 2019-10-11 MED ORDER — LEVOTHYROXINE SODIUM 75 MCG PO TABS: 75.0000 ug | ORAL_TABLET | Freq: Every day | ORAL | 3 refills | Status: AC

## 2019-10-11 MED ORDER — PREDNISONE 20 MG PO TABS: 20.0000 mg | ORAL_TABLET | Freq: Two times a day (BID) | ORAL | 0 refills | Status: DC

## 2019-10-11 MED ORDER — ALLOPURINOL 100 MG PO TABS: 50.0000 mg | ORAL_TABLET | Freq: Every day | ORAL | 6 refills | Status: AC

## 2019-10-11 NOTE — Progress Notes (Signed)
Having pain in back and left knee.

## 2019-10-11 NOTE — Patient Instructions (Signed)

## 2019-10-12 ENCOUNTER — Emergency Department (HOSPITAL_COMMUNITY)
Admission: EM | Admit: 2019-10-12 | Discharge: 2019-10-13 | Discharge status: Against Medical Advice | Payer: Medicaid Other | Attending: Emergency Medicine | Admitting: Emergency Medicine

## 2019-10-12 ENCOUNTER — Encounter (HOSPITAL_COMMUNITY): Payer: Self-pay | Admitting: Obstetrics and Gynecology

## 2019-10-12 ENCOUNTER — Telehealth: Payer: Self-pay

## 2019-10-12 ENCOUNTER — Ambulatory Visit: Payer: Medicaid Other | Admitting: Cardiology

## 2019-10-12 DIAGNOSIS — R799 Abnormal finding of blood chemistry, unspecified: Secondary | ICD-10-CM | POA: Diagnosis not present

## 2019-10-12 DIAGNOSIS — Z531 Procedure and treatment not carried out because of patient's decision for reasons of belief and group pressure: Secondary | ICD-10-CM | POA: Diagnosis not present

## 2019-10-12 LAB — COMPREHENSIVE METABOLIC PANEL
ALT: 18 U/L (ref 0–44)
AST: 23 U/L (ref 15–41)
Albumin: 2.9 g/dL — ABNORMAL LOW (ref 3.5–5.0)
Alkaline Phosphatase: 65 U/L (ref 38–126)
Anion gap: 15 (ref 5–15)
BUN: 116 mg/dL — ABNORMAL HIGH (ref 6–20)
CO2: 23 mmol/L (ref 22–32)
Calcium: 8.2 mg/dL — ABNORMAL LOW (ref 8.9–10.3)
Chloride: 101 mmol/L (ref 98–111)
Creatinine, Ser: 7.49 mg/dL — ABNORMAL HIGH (ref 0.61–1.24)
GFR calc Af Amer: 8 mL/min — ABNORMAL LOW (ref >60)
GFR calc non Af Amer: 7 mL/min — ABNORMAL LOW (ref >60)
Glucose, Bld: 144 mg/dL — ABNORMAL HIGH (ref 70–99)
Potassium: 4 mmol/L (ref 3.5–5.1)
Sodium: 139 mmol/L (ref 135–145)
Total Bilirubin: 0.6 mg/dL (ref 0.3–1.2)
Total Protein: 7.5 g/dL (ref 6.5–8.1)

## 2019-10-12 LAB — CMP14+EGFR
ALT: 14 IU/L (ref 0–44)
AST: 20 IU/L (ref 0–40)
Albumin/Globulin Ratio: 0.7 — ABNORMAL LOW (ref 1.2–2.2)
Albumin: 2.9 g/dL — ABNORMAL LOW (ref 3.8–4.9)
Alkaline Phosphatase: 81 IU/L (ref 48–121)
BUN/Creatinine Ratio: 15 (ref 9–20)
BUN: 111 mg/dL (ref 6–24)
Bilirubin Total: 0.4 mg/dL (ref 0.0–1.2)
CO2: 19 mmol/L — ABNORMAL LOW (ref 20–29)
Calcium: 8.6 mg/dL — ABNORMAL LOW (ref 8.7–10.2)
Chloride: 98 mmol/L (ref 96–106)
Creatinine, Ser: 7.39 mg/dL — ABNORMAL HIGH (ref 0.76–1.27)
GFR calc Af Amer: 9 mL/min/{1.73_m2} — ABNORMAL LOW (ref >59)
GFR calc non Af Amer: 7 mL/min/{1.73_m2} — ABNORMAL LOW (ref >59)
Globulin, Total: 4.3 g/dL (ref 1.5–4.5)
Glucose: 134 mg/dL — ABNORMAL HIGH (ref 65–99)
Potassium: 4.6 mmol/L (ref 3.5–5.2)
Sodium: 138 mmol/L (ref 134–144)
Total Protein: 7.2 g/dL (ref 6.0–8.5)

## 2019-10-12 LAB — CBC WITH DIFFERENTIAL/PLATELET
Abs Immature Granulocytes: 0.12 10*3/uL — ABNORMAL HIGH (ref 0.00–0.07)
Basophils Absolute: 0 10*3/uL (ref 0.0–0.1)
Basophils Relative: 0 %
Eosinophils Absolute: 0.3 10*3/uL (ref 0.0–0.5)
Eosinophils Relative: 3 %
HCT: 25.7 % — ABNORMAL LOW (ref 39.0–52.0)
Hemoglobin: 7.8 g/dL — ABNORMAL LOW (ref 13.0–17.0)
Immature Granulocytes: 1 %
Lymphocytes Relative: 11 %
Lymphs Abs: 1.1 10*3/uL (ref 0.7–4.0)
MCH: 25.4 pg — ABNORMAL LOW (ref 26.0–34.0)
MCHC: 30.4 g/dL (ref 30.0–36.0)
MCV: 83.7 fL (ref 80.0–100.0)
Monocytes Absolute: 1 10*3/uL (ref 0.1–1.0)
Monocytes Relative: 9 %
Neutro Abs: 8.1 10*3/uL — ABNORMAL HIGH (ref 1.7–7.7)
Neutrophils Relative %: 76 %
Platelets: 443 10*3/uL — ABNORMAL HIGH (ref 150–400)
RBC: 3.07 MIL/uL — ABNORMAL LOW (ref 4.22–5.81)
RDW: 17.8 % — ABNORMAL HIGH (ref 11.5–15.5)
WBC: 10.7 10*3/uL — ABNORMAL HIGH (ref 4.0–10.5)
nRBC: 0 % (ref 0.0–0.2)

## 2019-10-12 MED ORDER — SODIUM CHLORIDE 0.9% FLUSH: 3.0000 mL | Freq: Once | INTRAVENOUS | Status: DC

## 2019-10-12 NOTE — Telephone Encounter (Signed)
Call placed to patient and explained that Dr Margarita Rana received his lab results and his renal function is severely abnormal and he needs to go to the ED.  He said that he has an appointment with the nephrologist on 10/16/2019.  This CM instructed him not to wait and to go to the ED today, it is very important that he is evaluated at the hospital.  He said he understood and would get a ride.

## 2019-10-12 NOTE — Progress Notes (Signed)
Subjective:  Patient ID: Jeffery [name], male    DOB: [birth year]  Age: 59 y.o. MRN: [id number]  CC: Hospitalization Follow-up   HPI Jeffery [name] presents for is a 59 year old male with history of type 2 diabetes mellitus (A1c8.0), hypertension, Charcot foot due to diabetes mellitus, A. fib (status post DCCV in 05/2019 with conversion to sinus rhythm), status post right rotator cuff repair who presents today for follow-up visit at the [facility] clinic. He had a hospitalization last month where he was hospitalized for sepsis secondary to left lower extremity cellulitis and hospital course was complicated with acute on chronic kidney injury.  Seen by nephrology and orthopedic during hospitalization, renal ultrasound was unremarkable.  He underwent renal biopsy.  Discharge creatinine was 4.96. Since then he has had an ED visit for worsening of lower extremity cellulitis and found to be hyperkalemic (at 6.2) which was treated, creatinine was 5.01. His left renal biopsy revealed: Immune complex mediated IgA glomerulonephritis with no evidence of tuft fibrinoid or cellular crescent formation Diabetic glomerulosclerosis, mild acute tubular injury  Complains of left knee pain; saw orthopedic Dr. Erlinda Hong yesterday and diagnosed with gout but was not placed on any medications and management was deferred to PCP per notes. He has his Unna boots in his lower extremities and has a home health nurse who will be visiting today to have this changed.  Complains of pedal edema, complains of feeling off balance and having to rely on a walker.  He is unhappy his elderly mom has to be his caregiver at this time. Also complains of back pain which is severe and he just took his pain medication 45 minutes ago.  He does have lumbar spinal stenosis and degenerative disc disease managed by his pain management clinic.   Not compliant with Lantus and skips doses due to the fact that he has decreased appetite  and is afraid of hypoglycemia. Fasting sugar was 97 this morning and he mostly takes 40 units of Lantus. He has an appointment with Nephrology coming up in the next week and he also sees cardiology tomorrow.  He has had abnormal thyroid labs over the last month and is not on thyroid medications.   Past Medical History:  Diagnosis Date  . Atrial fibrillation ([place])   . Cellulitis and abscess of foot 07/2019   RIGHT FOOT  . Charcot's joint of right foot   . DDD (degenerative disc disease), lumbar   . Diabetes mellitus without complication ([place])   . Fatty liver   . Hypertension   . Obesity   . Rotator cuff disorder   . Shoulder impingement, right   . Spinal stenosis at L4-L5 level     Past Surgical History:  Procedure Laterality Date  . AMPUTATION Left 10/02/2014   Procedure: Left Third toe amputation ;  Surgeon: [name], MD;  Location: [facility];  Service: Orthopedics;  Laterality: Left;  Regular bed, wants to follow hip  . ANTERIOR CRUCIATE LIGAMENT REPAIR Right 90   reconstruction  . APPLICATION OF WOUND VAC Left 10/02/2014   Procedure: APPLICATION OF WOUND VAC; toe Surgeon: [name], MD;  Location: [facility];  Service: Orthopedics;  Laterality: Left;  . CARDIOVERSION N/A 04/26/2019   Procedure: CARDIOVERSION;  Surgeon: Pixie Casino, MD;  Location[facility] ENDOSCOPY;  Service: Cardiovascular;  Laterality: N/A;  . CARDIOVERSION N/A 05/18/2019   Procedure: CARDIOVERSION (CATH LAB);  Surgeon: Constance Haw, MD;  Location: [facility] CV LAB;  Service: Cardiovascular;  Laterality: N/A;  . I & D EXTREMITY Left 10/05/2014   Procedure: IRRIGATION AND DEBRIDEMENT LEFT FOOT;  Surgeon: [name], MD;  Location: [facility];  Service: Orthopedics;  Laterality: Left;  . INCISION AND DRAINAGE Right 08/09/2019   Procedure: INCISION AND DRAINAGE RIGHT FOOT;  Surgeon: Trula Slade, DPM;  Location: [facility];  Service: Podiatry;  Laterality: Right;  . KNEE ARTHROSCOPY W/ ACL RECONSTRUCTION Right   .  TOTAL KNEE ARTHROPLASTY Right 03/28/2015  . TOTAL KNEE ARTHROPLASTY Right 03/28/2015   Procedure: RIGHT TOTAL KNEE ARTHROPLASTY;  Surgeon: [name], MD;  Location: [facility];  Service: Orthopedics;  Laterality: Right;  . WOUND DEBRIDEMENT Right 08/14/2019   Procedure: DEBRIDEMENT WOUND;  Surgeon: Trula Slade, DPM;  Location: [facility];  Service: Podiatry;  Laterality: Right;    Family History  Problem Relation Age of Onset  . Diabetes Father   . Hypertension Father   . Heart failure Father     Allergies  Allergen Reactions  . Metformin And Related Other (See Comments)    GI upset    Outpatient Medications Prior to Visit  Medication Sig Dispense Refill  . Accu-Chek FastClix Lancets MISC Use as directed to check blood sugar at least twice daily. E11.8 E11.65 Z79.4 (Patient taking differently: 1 each by Other route See admin instructions. Use as directed to check blood sugar at least twice daily. E11.8 E11.65 Z79.4) 102 each 11  . amiodarone (PACERONE) 200 MG tablet Take 200 mg by mouth 2 (two) times daily.     Marland Kitchen amLODipine (NORVASC) 10 MG tablet Take 1 tablet (10 mg total) by mouth daily. 30 tablet 1  . apixaban (ELIQUIS) 5 MG TABS tablet Take 1 tablet (5 mg total) by mouth 2 (two) times daily. 60 tablet 6  . Blood Glucose Monitoring Suppl (ACCU-CHEK AVIVA) device Use as instructed to check blood sugar 2 times daily. E11.8 E11.65 Z79.4 (Patient taking differently: 1 each by Other route See admin instructions. Use as instructed to check blood sugar 2 times daily. E11.8 E11.65 Z79.4) 1 each 0  . collagenase (SANTYL) ointment Apply 1 application topically daily. 15 g 0  . furosemide (LASIX) 80 MG tablet Take 1 tablet (80 mg total) by mouth 2 (two) times daily. 60 tablet 1  . gabapentin (NEURONTIN) 300 MG capsule Take 1 capsule (300 mg total) by mouth 2 (two) times daily. (Patient taking differently: Take 600 mg by mouth at bedtime as needed (pain). ) 180 capsule 1  . glucose blood  (ACCU-CHEK AVIVA) test strip Use as instructed to check blood sugar 2 times daily. E11.8 E11.65 Z79.4 (Patient taking differently: 1 each by Other route See admin instructions. Use as instructed to check blood sugar 2 times daily. E11.8 E11.65 Z79.4) 100 each 12  . hydrALAZINE (APRESOLINE) 50 MG tablet Take 1 tablet (50 mg total) by mouth every 8 (eight) hours. 90 tablet 1  . insulin lispro (INSULIN LISPRO) 100 UNIT/ML KwikPen Junior Inject 0.08 mLs (8 Units total) into the skin 3 (three) times daily. (Patient taking differently: Inject 8 Units into the skin 3 (three) times daily as needed (Low blood sugar). ) 30 mL 6  . Insulin Pen Needle (B-D ULTRAFINE III SHORT PEN) 31G X 8 MM MISC 1 each by Does not apply route 3 (three) times daily. 100 each 5  . Insulin Syringe-Needle U-100 (TRUEPLUS INSULIN SYRINGE) 30G X 5/16" 0.5 ML MISC Use as directed 3 times daily (Patient taking differently: 1 each by Other  route See admin instructions. Use as directed 3 times daily) 100 each 5  . liraglutide (VICTOZA) 18 MG/3ML SOPN Inject 0.3 mLs (1.8 mg total) into the skin daily. 30 mL 6  . Misc. Devices MISC Extra-large bedside commode. Dx left leg cellulitis 1 each 0  . oxyCODONE-acetaminophen (PERCOCET) 10-325 MG tablet Take 1 tablet by mouth every 4 (four) hours as needed for pain.     . polyethylene glycol (MIRALAX / GLYCOLAX) 17 g packet Take 17 g by mouth daily as needed for mild constipation. 14 each 0  . Sennosides (EX-LAX) 15 MG TABS Take 15 mg by mouth daily as needed (Constipation).    . sodium chloride (OCEAN) 0.65 % SOLN nasal spray Place 1 spray into both nostrils daily as needed for congestion.     Karen Chafe INSULIN SYRINGE 30G X 5/16" 0.5 ML MISC USE AS DIRECTED 3 TIMES DAILY (Patient taking differently: 1 each by Other route in the morning, at noon, and at bedtime. ) 100 each 0  . atorvastatin (LIPITOR) 20 MG tablet Take 1 tablet (20 mg total) by mouth daily. 90 tablet 1  . insulin glargine (LANTUS  SOLOSTAR) 100 UNIT/ML Solostar Pen Inject 50 Units into the skin 2 (two) times daily. (Patient taking differently: Inject 51 Units into the skin 2 (two) times daily. ) 30 mL 6   No facility-administered medications prior to visit.     ROS Review of Systems General: negative for fever, weight loss, appetite change Eyes: no visual symptoms. ENT: no ear symptoms, no sinus tenderness, no nasal congestion or sore throat. Neck: no pain  Respiratory: no wheezing, shortness of breath, cough Cardiovascular: no chest pain, +dyspnea on exertion, +pedal edema, no orthopnea. Gastrointestinal: no abdominal pain, no diarrhea, no constipation Genito-Urinary: no urinary frequency, no dysuria, no polyuria. Hematologic: no bruising Endocrine: no cold or heat intolerance Neurological: no headaches, no seizures, no tremors Musculoskeletal: see HPI Skin: see HPI Psychological: no depression, no anxiety,    Objective:  BP 129/64   Pulse 83   Ht 6' 2"  (1.88 m)   Wt (!) 343 lb (155.6 kg)   SpO2 92%   BMI 44.04 kg/m   BP/Weight 10/11/2019 04/25/5168 0/03/7492  Systolic BP 496 - 759  Diastolic BP 64 - 73  Wt. (Lbs) 343 364 364.64  BMI 44.04 46.73 46.82      Physical Exam Constitutional:      Appearance: He is well-developed, morbidly obese Neck:     Vascular: No JVD.  Cardiovascular:     Rate and Rhythm: Normal rate.     Heart sounds: Normal heart sounds. No murmur heard.   Pulmonary:     Effort: Pulmonary effort is normal.     Breath sounds: Normal breath sounds. No wheezing or rales.  Chest:     Chest wall: No tenderness.  Abdominal:     General: Bowel sounds are normal. There is no distension.     Palpations: Abdomen is soft. There is no mass.     Tenderness: There is no abdominal tenderness.  Musculoskeletal:     Right lower leg: Edema present.     Left lower leg: Edema present.     Comments: Haematologist b/l  Neurological:     Mental Status: He is alert and oriented to person,  place, and time.  Psychiatric:        Mood and Affect: Mood normal.     CMP Latest Ref Rng & Units 10/11/2019 10/01/2019 09/28/2019  Glucose 65 -  99 mg/dL 134(H) 130(H) 89  BUN 6 - 24 mg/dL 111(HH) 72(H) 66(H)  Creatinine 0.76 - 1.27 mg/dL 7.39(H) 5.01(H) 4.96(H)  Sodium 134 - 144 mmol/L 138 135 137  Potassium 3.5 - 5.2 mmol/L 4.6 6.2(H) 4.6  Chloride 96 - 106 mmol/L 98 101 100  CO2 20 - 29 mmol/L 19(L) 22 24  Calcium 8.7 - 10.2 mg/dL 8.6(L) 8.5(L) 8.5(L)  Total Protein 6.0 - 8.5 g/dL 7.2 6.9 -  Total Bilirubin 0.0 - 1.2 mg/dL 0.4 1.6(H) -  Alkaline Phos 48 - 121 IU/L 81 57 -  AST 0 - 40 IU/L 20 44(H) -  ALT 0 - 44 IU/L 14 18 -    Lipid Panel     Component Value Date/Time   CHOL 157 04/04/2018 1103   TRIG 256 (H) 04/04/2018 1103   HDL 42 04/04/2018 1103   CHOLHDL 3.7 04/04/2018 1103   CHOLHDL 3.8 04/10/2010 0315   VLDL 32 04/10/2010 0315   LDLCALC 64 04/04/2018 1103    CBC    Component Value Date/Time   WBC 12.9 (H) 10/01/2019 1655   RBC 3.08 (L) 10/01/2019 1655   HGB 8.2 (L) 10/01/2019 1655   HGB 10.4 (L) 08/25/2019 0902   HCT 27.3 (L) 10/01/2019 1655   HCT 32.2 (L) 08/25/2019 0902   PLT 330 10/01/2019 1655   PLT 377 08/25/2019 0902   MCV 88.6 10/01/2019 1655   MCV 88 08/25/2019 0902   MCH 26.6 10/01/2019 1655   MCHC 30.0 10/01/2019 1655   RDW 17.5 (H) 10/01/2019 1655   RDW 14.8 08/25/2019 0902   LYMPHSABS 0.6 (L) 10/01/2019 1655   LYMPHSABS 2.2 03/28/2019 1352   MONOABS 1.3 (H) 10/01/2019 1655   EOSABS 0.5 10/01/2019 1655   EOSABS 0.4 03/28/2019 1352   BASOSABS 0.0 10/01/2019 1655   BASOSABS 0.1 03/28/2019 1352    Lab Results  Component Value Date   HGBA1C 8.0 (H) 09/07/2019    Lab Results  Component Value Date   TSH 7.370 (H) 09/19/2019    Assessment & Plan:  1. Type 2 diabetes mellitus with other circulatory complication, with long-term current use of insulin (HCC) Uncontrolled with A1c of 8.0; his goal is less than 8.0 due to multiple  comorbidities He has been administering 40 units rather than 50 units of his Lantus twice daily to prevent hypoglycemia and I will keep him on this dose Advised that should blood sugar start to trend up he should titrate his Lantus up by 2 units twice daily Counseled on Diabetic diet, my plate method, 696 minutes of moderate intensity exercise/week Blood sugar logs with fasting goals of 80-120 mg/dl, random of less than 180 and in the event of sugars less than 60 mg/dl or greater than 400 mg/dl encouraged to notify the clinic. Advised on the need for annual eye exams, annual foot exams, Pneumonia vaccine. - POCT glucose (manual entry) - insulin glargine (LANTUS SOLOSTAR) 100 UNIT/ML Solostar Pen; Inject 40 Units into the skin 2 (two) times daily.  Dispense: 30 mL; Refill: 6  2. Hyperlipidemia associated with type 2 diabetes mellitus ([place]) He does have hypertriglyceridemia Continue statin, fish oil capsules - atorvastatin (LIPITOR) 20 MG tablet; Take 1 tablet (20 mg total) by mouth daily.  Dispense: 90 tablet; Refill: 1  3. Acute idiopathic gout of left knee Currently not on any medication We will place on short course of prednisone after which he will commence a renal dose of allopurinol Discussed low purine eating plan - allopurinol (ZYLOPRIM)  100 MG tablet; Take 0.5 tablets (50 mg total) by mouth daily.  Dispense: 15 tablet; Refill: 6  4. Acquired hypothyroidism Possibly secondary to amiodarone use Not on medication Initiate levothyroxine - levothyroxine (SYNTHROID) 75 MCG tablet; Take 1 tablet (75 mcg total) by mouth daily.  Dispense: 90 tablet; Refill: 3  5. Essential hypertension Controlled Counseled on blood pressure goal of less than 130/80, low-sodium, DASH diet, medication compliance, 150 minutes of moderate intensity exercise per week. Discussed medication compliance, adverse effects.  6. Type 2 diabetes mellitus with stage 5 chronic kidney disease not on chronic dialysis,  with long-term current use of insulin (HCC) We will check renal function again today He is followed by nephrologist - CMP14+EGFR  7. Cellulitis of lower extremity, unspecified laterality Uncontrolled Underlying venous insufficiency Continue Unna boot which will be changed by home health nurse today  8. Persistent atrial fibrillation (HCC) Currently in sinus rhythm  On Eliquis and amiodarone    Meds ordered this encounter  Medications  . levothyroxine (SYNTHROID) 75 MCG tablet    Sig: Take 1 tablet (75 mcg total) by mouth daily.    Dispense:  90 tablet    Refill:  3  . allopurinol (ZYLOPRIM) 100 MG tablet    Sig: Take 0.5 tablets (50 mg total) by mouth daily.    Dispense:  15 tablet    Refill:  6  . predniSONE (DELTASONE) 20 MG tablet    Sig: Take 1 tablet (20 mg total) by mouth 2 (two) times daily with a meal.    Dispense:  10 tablet    Refill:  0  . insulin glargine (LANTUS SOLOSTAR) 100 UNIT/ML Solostar Pen    Sig: Inject 40 Units into the skin 2 (two) times daily.    Dispense:  30 mL    Refill:  6  . atorvastatin (LIPITOR) 20 MG tablet    Sig: Take 1 tablet (20 mg total) by mouth daily.    Dispense:  90 tablet    Refill:  1    Follow-up: Return in about 6 weeks (around 11/22/2019) for coordination of care.       [name], MD, FAAFP. [facility] [place], [place]   10/12/2019, 12:36 PM

## 2019-10-12 NOTE — ED Triage Notes (Signed)
Pt was told to come in by PCP. Patient's kidney levels were reported to be elevated, patient unsure of exact numbers.

## 2019-10-13 ENCOUNTER — Ambulatory Visit (INDEPENDENT_AMBULATORY_CARE_PROVIDER_SITE_OTHER): Payer: Medicaid Other | Admitting: Podiatry

## 2019-10-13 ENCOUNTER — Encounter: Payer: Self-pay | Admitting: Podiatry

## 2019-10-13 ENCOUNTER — Other Ambulatory Visit: Payer: Self-pay

## 2019-10-13 VITALS — Temp 97.1°F

## 2019-10-13 DIAGNOSIS — E11621 Type 2 diabetes mellitus with foot ulcer: Secondary | ICD-10-CM

## 2019-10-13 DIAGNOSIS — L97512 Non-pressure chronic ulcer of other part of right foot with fat layer exposed: Secondary | ICD-10-CM | POA: Diagnosis not present

## 2019-10-13 DIAGNOSIS — I83009 Varicose veins of unspecified lower extremity with ulcer of unspecified site: Secondary | ICD-10-CM | POA: Diagnosis not present

## 2019-10-13 DIAGNOSIS — L97909 Non-pressure chronic ulcer of unspecified part of unspecified lower leg with unspecified severity: Secondary | ICD-10-CM

## 2019-10-13 NOTE — ED Notes (Signed)
Pt returned stickers to registration.

## 2019-10-15 NOTE — Progress Notes (Signed)
Subjective: 59 year old male presents the office today for follow-up evaluation of bilateral lower extremity edema, lymphedema with multiple ulcerations.  He states the legs are actually doing better and home health has been continue to change the bandages.  The swelling has improved and the wounds are healing.  He did shower today and he hit his left second toe causing it to bleed.  Overall he feels well.  He denies any fevers or chills.  No chest pain, shortness of breath.  He follow-up with his primary care physician his creatinine was significantly elevated was recommended to the emergency department for which he did.  He states after sitting for several hours he left without being seen.  He is very frustrated today in regards to his kidney function he states he is following up with his kidney doctor on Monday.  Objective: AAO x3, NAD DP/PT pulses palpable bilaterally, CRT less than 3 seconds Sensation decreased. Bilateral lower extremity lymphedema, swelling present although it appears to be substantially improved compared to when I last saw him.  There is multiple areas of old blisters with superficial areas of skin breakdown and there is some clear drainage expressed but overall the drainage is much improved as well.  Large wound on the dorsal lateral aspect of right foot from prior graft with some granulation, fibrotic tissue present but there is no probing, undermining or tunneling.  There is no areas of fluctuation crepitation there is no malodor.  Dried blood present in the left second toe with superficial breakdown from where he hit it this morning.  Mild edema. No open lesions or pre-ulcerative lesions.  No pain with calf compression, swelling, warmth, erythema  Assessment: Venous insufficiency; right foot full-thickness ulceration  Plan: -All treatment options discussed with the patient including all alternatives, risks, complications.  -At this point we will continue with local wound  care.  He does have an appointment see the wound care center coming up.  Today applied in the boots bilaterally.  He can also continue with Santyl to the right foot wound. -I have a long discussion with him in regards to being seen by the emergency department given his kidney function as this was recommended by his primary care physician.  He does not want go back to the ER.  I offered to try to get him directly admitted to the hospitalist however he declines this.  He states he can follow-up with his kidney doctor on Monday.  I discussed with him that this will worsen over the weekend can be very detrimental.  He understands the risks. -Patient encouraged to call the office with any questions, concerns, change in symptoms.   Trula Slade DPM

## 2019-10-16 DIAGNOSIS — I129 Hypertensive chronic kidney disease with stage 1 through stage 4 chronic kidney disease, or unspecified chronic kidney disease: Secondary | ICD-10-CM | POA: Diagnosis not present

## 2019-10-16 DIAGNOSIS — N179 Acute kidney failure, unspecified: Secondary | ICD-10-CM | POA: Diagnosis not present

## 2019-10-16 DIAGNOSIS — E1122 Type 2 diabetes mellitus with diabetic chronic kidney disease: Secondary | ICD-10-CM | POA: Diagnosis not present

## 2019-10-16 DIAGNOSIS — D631 Anemia in chronic kidney disease: Secondary | ICD-10-CM | POA: Diagnosis not present

## 2019-10-16 DIAGNOSIS — N1831 Chronic kidney disease, stage 3a: Secondary | ICD-10-CM | POA: Diagnosis not present

## 2019-10-23 ENCOUNTER — Encounter (HOSPITAL_BASED_OUTPATIENT_CLINIC_OR_DEPARTMENT_OTHER): Payer: Medicaid Other | Attending: Internal Medicine | Admitting: Internal Medicine

## 2019-10-23 DIAGNOSIS — Z87891 Personal history of nicotine dependence: Secondary | ICD-10-CM | POA: Diagnosis not present

## 2019-10-23 DIAGNOSIS — S50812A Abrasion of left forearm, initial encounter: Secondary | ICD-10-CM | POA: Insufficient documentation

## 2019-10-23 DIAGNOSIS — I130 Hypertensive heart and chronic kidney disease with heart failure and stage 1 through stage 4 chronic kidney disease, or unspecified chronic kidney disease: Secondary | ICD-10-CM | POA: Insufficient documentation

## 2019-10-23 DIAGNOSIS — I480 Paroxysmal atrial fibrillation: Secondary | ICD-10-CM | POA: Insufficient documentation

## 2019-10-23 DIAGNOSIS — Z7901 Long term (current) use of anticoagulants: Secondary | ICD-10-CM | POA: Diagnosis not present

## 2019-10-23 DIAGNOSIS — X58XXXA Exposure to other specified factors, initial encounter: Secondary | ICD-10-CM | POA: Insufficient documentation

## 2019-10-23 DIAGNOSIS — L97821 Non-pressure chronic ulcer of other part of left lower leg limited to breakdown of skin: Secondary | ICD-10-CM | POA: Insufficient documentation

## 2019-10-23 DIAGNOSIS — E11621 Type 2 diabetes mellitus with foot ulcer: Secondary | ICD-10-CM | POA: Diagnosis not present

## 2019-10-23 DIAGNOSIS — S90812A Abrasion, left foot, initial encounter: Secondary | ICD-10-CM | POA: Insufficient documentation

## 2019-10-23 DIAGNOSIS — E1142 Type 2 diabetes mellitus with diabetic polyneuropathy: Secondary | ICD-10-CM | POA: Insufficient documentation

## 2019-10-23 DIAGNOSIS — E1122 Type 2 diabetes mellitus with diabetic chronic kidney disease: Secondary | ICD-10-CM | POA: Diagnosis not present

## 2019-10-23 DIAGNOSIS — N189 Chronic kidney disease, unspecified: Secondary | ICD-10-CM | POA: Diagnosis not present

## 2019-10-23 DIAGNOSIS — L97811 Non-pressure chronic ulcer of other part of right lower leg limited to breakdown of skin: Secondary | ICD-10-CM | POA: Diagnosis not present

## 2019-10-23 DIAGNOSIS — I509 Heart failure, unspecified: Secondary | ICD-10-CM | POA: Diagnosis not present

## 2019-10-23 DIAGNOSIS — E11622 Type 2 diabetes mellitus with other skin ulcer: Secondary | ICD-10-CM | POA: Diagnosis not present

## 2019-10-24 ENCOUNTER — Telehealth: Payer: Self-pay | Admitting: Family Medicine

## 2019-10-24 NOTE — Telephone Encounter (Signed)
CMA will reach out to [employer] to see if fax was received.

## 2019-10-24 NOTE — Telephone Encounter (Signed)
Patient called to check the status of a potty chair that he said doctor was suppose to order since 09/28/19.  He still has not heard from the supplier.  Please advise and call patient to give him an update at [phone]

## 2019-10-25 ENCOUNTER — Ambulatory Visit: Payer: Medicaid Other | Admitting: Family Medicine

## 2019-10-25 NOTE — Progress Notes (Signed)
Jeffery, [name] ([id number]) . Visit Report for 10/23/2019 Abuse/Suicide Risk Screen Details Patient Name: Date of Service: Jeffery Porter T. 10/23/2019 2:45 PM Medical Record Number: [id number] Patient Account Number: [id number] Date of Birth/Sex: Treating RN: 1960-12-04 ([name]) Carlene Coria Primary Care Sarajane Fambrough: [name] Other Clinician: Referring Willie Loy: Treating Zakhia Seres/Extender: [name] Weeks in Treatment: 0 Abuse/Suicide Risk Screen Items Answer ABUSE RISK SCREEN: Has anyone close to you tried to hurt or harm you recentlyo No Do you feel uncomfortable with anyone in your familyo No Has anyone forced you do things that you didnt want to doo No Electronic Signature(s) Signed: 10/25/2019 6:04:59 PM By: Carlene Coria RN Entered By: Carlene Coria on 10/23/2019 15:48:18 -------------------------------------------------------------------------------- Activities of Daily Living Details Patient Name: Date of Service: Jeffery [name] 10/23/2019 2:45 PM Medical Record Number: [id number] Patient Account Number: [id number] Date of Birth/Sex: Treating RN: [birth year] ([name]) Carlene Coria Primary Care Bernardino Dowell: [name] Other Clinician: Referring Heena Woodbury: Treating Lamario Mani/Extender: [name] Weeks in Treatment: 0 Activities of Daily Living Items Answer Activities of Daily Living (Please select one for each item) Drive Automobile Not Able T Medications ake Need Assistance Use T elephone Completely Able Care for Appearance Need Assistance Use T oilet Need Assistance Bath / Shower Need Assistance Dress Self Need Assistance Feed Self Need Assistance Walk Need Assistance Get In / Out Bed Need Assistance Housework Not Able Prepare Meals Need Assistance Handle Money Not Able Shop for Self Not Able Electronic Signature(s) Signed: 10/25/2019 6:04:59 PM By: Carlene Coria RN Entered By: Carlene Coria on 10/23/2019 15:49:04 -------------------------------------------------------------------------------- Education Screening Details Patient Name: Date of Service: Jeffery Porter T. 10/23/2019 2:45 PM Medical Record Number: [id number] Patient Account Number: [id number] Date of Birth/Sex: Treating RN: 12-05-1960 ([name]) Carlene Coria Primary Care Elvis Boot: [name] Other Clinician: Referring Jodi Kappes: Treating Joshua Zeringue/Extender: [name] Weeks in Treatment: 0 Primary Learner Assessed: Patient Learning Preferences/Education Level/Primary Language Learning Preference: Explanation Highest Education Level: High School Preferred Language: English Cognitive Barrier Language Barrier: No Translator Needed: No Memory Deficit: No Emotional Barrier: No Cultural/Religious Beliefs Affecting Medical Care: No Physical Barrier Impaired Vision: No Impaired Hearing: No Decreased Hand dexterity: No Knowledge/Comprehension Knowledge Level: Medium Comprehension Level: Medium Ability to understand written instructions: Medium Ability to understand verbal instructions: Medium Motivation Anxiety Level: Anxious Cooperation: Cooperative Education Importance: Acknowledges Need Interest in Health Problems: Uninterested Perception: Coherent Willingness to Engage in Self-Management Medium Activities: Readiness to Engage in Swan: Electronic Signature(s) Signed: 10/25/2019 6:04:59 PM By: Carlene Coria RN Entered By: Carlene Coria on 10/23/2019 15:49:57 -------------------------------------------------------------------------------- Fall Risk Assessment Details Patient Name: Date of Service: Jeffery Porter T. 10/23/2019 2:45 PM Medical Record Number: [id number] Patient Account Number: [id number] Date of Birth/Sex: Treating RN: [birth year] ([name]) Carlene Coria Primary Care Shawntell Dixson: [name] Other  Clinician: Referring Kwame Ryland: Treating Mariajose Mow/Extender: Kandice Robinsons, Charlane Ferretti Weeks in Treatment: 0 Fall Risk Assessment Items Have you had 2 or more falls in the last 12 monthso 0 No Have you had any fall that resulted in injury in the last 12 monthso 0 No FALLS RISK SCREEN History of falling - immediate or within 3 months 0 No Secondary diagnosis (Do you have 2 or more medical diagnoseso) 0 No Ambulatory aid None/bed rest/wheelchair/nurse 0 No Crutches/cane/walker 0 No Furniture 0 No Intravenous therapy Access/Saline/Heparin Lock 0 No Gait/Transferring Normal/ bed rest/ wheelchair 0 No Weak (short steps with or without  shuffle, stooped but able to lift head while walking, may seek 0 No support from furniture) Impaired (short steps with shuffle, may have difficulty arising from chair, head down, impaired 0 No balance) Mental Status Oriented to own ability 0 No Electronic Signature(s) Signed: 10/25/2019 6:04:59 PM By: Carlene Coria RN Entered By: Carlene Coria on 10/23/2019 15:50:02 -------------------------------------------------------------------------------- Foot Assessment Details Patient Name: Date of Service: Jeffery Porter T. 10/23/2019 2:45 PM Medical Record Number: [id number] Patient Account Number: [id number] Date of Birth/Sex: Treating RN: 16-Jan-1961 ([name]) Carlene Coria Primary Care Loman Logan: [name] Other Clinician: Referring Netanel Yannuzzi: Treating Athaliah Baumbach/Extender: [name] Weeks in Treatment: 0 Foot Assessment Items Site Locations + = Sensation present, - = Sensation absent, C = Callus, U = Ulcer R = Redness, W = Warmth, M = Maceration, PU = Pre-ulcerative lesion F = Fissure, S = Swelling, D = Dryness Assessment Right: Left: Other Deformity: No No Prior Foot Ulcer: No No Prior Amputation: No Yes Charcot Joint: Yes No Ambulatory Status: Ambulatory Without Help Gait: Unsteady Electronic  Signature(s) Signed: 10/25/2019 6:04:59 PM By: Carlene Coria RN Entered By: Carlene Coria on 10/23/2019 16:09:57 -------------------------------------------------------------------------------- Nutrition Risk Screening Details Patient Name: Date of Service: Jeffery [name] 10/23/2019 2:45 PM Medical Record Number: [id number] Patient Account Number: [id number] Date of Birth/Sex: Treating RN: 07/05/1960 ([name]) Carlene Coria Primary Care Reyce Lubeck: [name] Other Clinician: Referring Lavalle Skoda: Treating Keiaira Donlan/Extender: Kandice Robinsons, Charlane Ferretti Weeks in Treatment: 0 Height (in): 74 Weight (lbs): 345 Body Mass Index (BMI): 44.3 Nutrition Risk Screening Items Score Screening NUTRITION RISK SCREEN: I have an illness or condition that made me change the kind and/or amount of food I eat 0 No I eat fewer than two meals per day 0 No I eat few fruits and vegetables, or milk products 0 No I have three or more drinks of beer, liquor or wine almost every day 0 No I have tooth or mouth problems that make it hard for me to eat 0 No I don't always have enough money to buy the food I need 0 No I eat alone most of the time 0 No I take three or more different prescribed or over-the-counter drugs a day 1 Yes Without wanting to, I have lost or gained 10 pounds in the last six months 0 No I am not always physically able to shop, cook and/or feed myself 2 Yes Nutrition Protocols Good Risk Protocol Moderate Risk Protocol 0 Provide education on nutrition High Risk Proctocol Risk Level: Moderate Risk Score: 3 Electronic Signature(s) Signed: 10/25/2019 6:04:59 PM By: Carlene Coria RN Entered By: Carlene Coria on 10/23/2019 15:50:21

## 2019-10-25 NOTE — Progress Notes (Signed)
ATTIKUS, BARTOSZEK [name] ([id number]) . Visit Report for 10/23/2019 Chief Complaint Document Details Patient Name: Date of Service: Jeffery [name]. 10/23/2019 2:45 PM Medical Record Number: [id number] Patient Account Number: [id number] Date of Birth/Sex: Treating RN: 02/01/1961 ([name]) [name] Primary Care Provider: [name] Other Clinician: Referring Provider: Treating Provider/Extender: [name] Weeks in Treatment: 0 Information Obtained from: Patient Chief Complaint 7/26; patient is here for review of wounds on his bilateral lower extremities and left forearm of different etiologies. Electronic Signature(s) Signed: 10/23/2019 6:00:25 PM By: Linton Ham MD Entered By: Linton Ham on 10/23/2019 17:43:15 -------------------------------------------------------------------------------- HPI Details Patient Name: Date of Service: Jeffery [name]. 10/23/2019 2:45 PM Medical Record Number: [id number] Patient Account Number: [id number] Date of Birth/Sex: Treating RN: 1960-12-28 ([name]) [name] Primary Care Provider: [name] Other Clinician: Referring Provider: Treating Provider/Extender: [name] Weeks in Treatment: 0 History of Present Illness HPI Description: ADMISSION 10/23/2019 This is a complex 59 year old man who is here with wounds on his bilateral lower legs feet and abrasion on his left forearm. These are generally of different etiology. He is a type II diabetic with peripheral neuropathy but does not have known PAD. Looking through [facility] health link I can see he was being followed by Dr. Earleen Newport earlier this year for a diabetic ulcer on the right foot. An MRI done in May showed no osteomyelitis. Shortly thereafter he developed cellulitis of the right leg and foot and was hospitalized from 5/10 through 5/19 with MSSA sepsis. During this hospitalization he was felt to have  osteomyelitis he had debridement twice of the right foot and on one occasion apparently a placement of ACell. He again was hospitalized from 6/10 through 7/1 again with left lower extremity cellulitis increasing edema and acute renal failure. An MRI showed left leg cellulitis but no other major findings. 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 He was discharged with multiple wounds on his legs which apparently started as blisters secondary to fluid overload. His creatinine on 7/5 was 7.49 potassium 4 BUN 116 CO2 of 23 albumin of 2.9. He was seen by Dr. Moshe Cipro of nephrology medications were adjusted she is following him. 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 The patient arrives in clinic today with multiple wounds of different etiologies on his bilateral lower extremities. On the right leg he has superficial areas on the right medial and lateral.. On his right lateral foot I think the original surgical wound. There is a small area at the base of his left second toe. On the left lower extremity a fairly large wound on the left second toe which was trauma from the shower door. He has several areas on the legs. Which are superficial. Finally he has an abrasion injury on his left arm/skin tear Past medical history includes Charcot foot. Left third toe amputation, type 2 diabetes with peripheral neuropathy, paroxysmal A. fib on Eliquis, diastolic heart failure, acute on chronic renal failure as described. ABI in our clinic was 1.18 on the right 1.19 on the left Electronic Signature(s) Signed: 10/23/2019 6:00:25 PM By: Linton Ham MD Entered By: Linton Ham on 10/23/2019 17:52:01 -------------------------------------------------------------------------------- Physical Exam Details Patient Name: Date of Service: Jeffery [name]. 10/23/2019 2:45 PM Medical Record Number: [id number] Patient  Account Number: [id number] Date of Birth/Sex: Treating RN: [birth year] ([name]) [name] Primary Care  Provider: [name] Other Clinician: Referring Provider: Treating Provider/Extender: [name] Weeks in Treatment: 0 Constitutional Patient is hypertensive.. Pulse regular and within target range for patient.Marland Kitchen Respirations regular, non-labored and within target range.. Temperature is normal and within the target range for the patient.Marland Kitchen Appears in no distress. Respiratory work of breathing is normal. Bilateral breath sounds are clear and equal in all lobes with no wheezes, rales or rhonchi.. Cardiovascular Soft midsystolic murmur JVP about 6 cm at 45 degrees. Minimal coccyx edema. Pedal pulses are palpable. 2+ pitting edema in both legs. Psychiatric appears at normal baseline. Notes Wound exam The wounds on his bilateral lower legs look like remanence of blisters. Generally clean superficial surfaces. These are bilateral. He has the original surgical wound on the right lateral foot. This has more substance to it debris on the surface washed off with wound cleanser and gauze. Right forearm abrasion/skin tear Electronic Signature(s) Signed: 10/23/2019 6:00:25 PM By: Linton Ham MD Entered By: Linton Ham on 10/23/2019 17:50:45 -------------------------------------------------------------------------------- Physician Orders Details Patient Name: Date of Service: Jeffery [name]. 10/23/2019 2:45 PM Medical Record Number: [id number] Patient Account Number: [id number] Date of Birth/Sex: Treating RN: Nov 21, 1960 ([name]) [name] Primary Care Provider: [name] Other Clinician: Referring Provider: Treating Provider/Extender: [name] Weeks in Treatment: 0 Verbal / Phone Orders: No Diagnosis Coding Follow-up Appointments ppointment in 1 week. - Thursday - ****EXTRA TIME - 8 MINUTES**** Return  A Dressing Change Frequency Change dressing three times week. - all wounds (wound clinic to change on Monday, home health to change on Wednesday and Friday) Skin Barriers/Peri-Wound Care Moisturizing lotion - both legs Wound Cleansing Clean wound with Normal Saline. - or wound cleanser Primary Wound Dressing Wound #1 Left,Proximal,Anterior Lower Leg Calcium Alginate with Silver Wound #10 Left [name] Second oe Calcium Alginate with Silver Wound #2 Left,Lateral Lower Leg Calcium Alginate with Silver Wound #3 Left,Medial Lower Leg Calcium Alginate with Silver Wound #4 Right [name] Second oe Calcium Alginate with Silver Wound #5 Right,Medial Lower Leg Calcium Alginate with Silver Wound #6 Right,Lateral Lower Leg Calcium Alginate with Silver Wound #7 Right,Lateral Foot Calcium Alginate with Silver Wound #8 Right,Proximal,Lateral Foot Calcium Alginate with Silver Wound #9 Left Forearm Xeroform Secondary Dressing Dry Gauze - all wounds ABD pad - all wounds on legs Wound #10 Left [name] Second oe Kerlix/Rolled Gauze Dry Gauze Wound #4 Right [name] Second oe Kerlix/Rolled Gauze Dry Gauze Wound #9 Left Forearm Kerlix/Rolled Gauze Dry Gauze Edema Control 4 layer compression - Bilateral Avoid standing for long periods of time Elevate legs to the level of the heart or above for 30 minutes daily and/or when sitting, a frequency of: - throughout the day Exercise regularly [facility] for wound care. - [facility] Electronic Signature(s) Signed: 10/23/2019 6:00:25 PM By: Linton Ham MD Signed: 10/25/2019 6:13:34 PM By: [name] RN, BSN Entered By: [name] on 10/23/2019 17:20:11 -------------------------------------------------------------------------------- Problem List Details Patient Name: Date of Service: Jeffery [name]. 10/23/2019 2:45 PM Medical Record Number: [id number] Patient Account Number: [id number] Date of  Birth/Sex: Treating RN: 07/24/1960 ([name]) [name] Primary Care Provider: [name] Other Clinician: Referring Provider: Treating Provider/Extender: [name] Weeks in Treatment: 0 Active Problems ICD-10 Encounter Code Description Active Date MDM Diagnosis E11.621 Type 2 diabetes mellitus with foot ulcer 10/23/2019 No Yes T81.31XA Disruption of external operation (surgical) wound, not elsewhere classified, 10/23/2019 No Yes initial encounter L97.811 Non-pressure  chronic ulcer of other part of right lower leg limited to breakdown 10/23/2019 No Yes of skin L97.821 Non-pressure chronic ulcer of other part of left lower leg limited to breakdown 10/23/2019 No Yes of skin S90.812D Abrasion, left foot, subsequent encounter 10/23/2019 No Yes S50.812D Abrasion of left forearm, subsequent encounter 10/23/2019 No Yes E11.22 Type 2 diabetes mellitus with diabetic chronic kidney disease 10/23/2019 No Yes Inactive Problems Resolved Problems Electronic Signature(s) Signed: 10/23/2019 6:00:25 PM By: Linton Ham MD Entered By: Linton Ham on 10/23/2019 17:42:01 -------------------------------------------------------------------------------- Progress Note Details Patient Name: Date of Service: Jeffery [name]. 10/23/2019 2:45 PM Medical Record Number: [id number] Patient Account Number: [id number] Date of Birth/Sex: Treating RN: 26-Apr-1960 ([name]) [name] Primary Care Provider: [name] Other Clinician: Referring Provider: Treating Provider/Extender: Kandice Robinsons, Charlane Ferretti Weeks in Treatment: 0 Subjective Chief Complaint Information obtained from Patient 7/26; patient is here for review of wounds on his bilateral lower extremities and left forearm of different etiologies. History of Present Illness (HPI) ADMISSION 10/23/2019 This is a complex 59 year old man who is here with wounds on his bilateral lower legs feet  and abrasion on his left forearm. These are generally of different etiology. He is a type II diabetic with peripheral neuropathy but does not have known PAD. Looking through [facility] health link I can see he was being followed by Dr. Earleen Newport earlier this year for a diabetic ulcer on the right foot. An MRI done in May showed no osteomyelitis. Shortly thereafter he developed cellulitis of the right leg and foot and was hospitalized from 5/10 through 5/19 with MSSA sepsis. During this hospitalization he was felt to have osteomyelitis he had debridement twice of the right foot and on one occasion apparently a placement of ACell. He again was hospitalized from 6/10 through 7/1 again with left lower extremity cellulitis increasing edema and acute renal failure. An MRI showed left leg cellulitis but no other major findings. 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 He was discharged with multiple wounds on his legs which apparently started as blisters secondary to fluid overload. His creatinine on 7/5 was 7.49 potassium 4 BUN 116 CO2 of 23 albumin of 2.9. He was seen by Dr. Moshe Cipro of nephrology medications were adjusted she is following him. 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 The patient arrives in clinic today with multiple wounds of different etiologies on his bilateral lower extremities. ooOn the right leg he has superficial areas on the right medial and lateral.. On his right lateral foot I think the original surgical wound. There is a small area at the base of his left second toe. ooOn the left lower extremity a fairly large wound on the left second toe which was trauma from the shower door. He has several areas on the legs. Which are superficial. ooFinally he has an abrasion injury on his left arm/skin tear Past medical history includes Charcot foot. Left third toe amputation, type 2 diabetes with  peripheral neuropathy, paroxysmal A. fib on Eliquis, diastolic heart failure, acute on chronic renal failure as described. ABI in our clinic was 1.18 on the right 1.19 on the left Patient History Information obtained from Patient. Allergies metformin Family History Diabetes - Paternal Grandparents,Father,Siblings, Heart Disease - Father, Hypertension - Siblings,Father,Paternal Grandparents, Stroke - Maternal Grandparents, No family history of Cancer, Hereditary Spherocytosis, Kidney Disease, Lung Disease, Seizures, Thyroid Problems, Tuberculosis. Social History Former smoker, Marital  Status - Single, Alcohol Use - Never, Drug Use - Prior History - cocaine, Caffeine Use - Daily. Medical History Eyes Denies history of Cataracts, Glaucoma, Optic Neuritis Ear/Nose/Mouth/Throat Denies history of Chronic sinus problems/congestion, Middle ear problems Hematologic/Lymphatic Denies history of Anemia, Hemophilia, Human Immunodeficiency Virus, Lymphedema, Sickle Cell Disease Respiratory Denies history of Aspiration, Asthma, Chronic Obstructive Pulmonary Disease (COPD), Pneumothorax, Sleep Apnea, Tuberculosis Cardiovascular Patient has history of Congestive Heart Failure, Hypertension Denies history of Angina, Arrhythmia, Coronary Artery Disease, Deep Vein Thrombosis, Hypotension, Myocardial Infarction, Peripheral Arterial Disease, Peripheral Venous Disease, Phlebitis, Vasculitis Gastrointestinal Denies history of Cirrhosis , Colitis, Crohnoos, Hepatitis A, Hepatitis B, Hepatitis C Endocrine Patient has history of Type II Diabetes - 6 Denies history of Type I Diabetes Genitourinary Denies history of End Stage Renal Disease Immunological Denies history of Lupus Erythematosus, Raynaudoos, Scleroderma Integumentary (Skin) Denies history of History of Burn Musculoskeletal Denies history of Gout, Rheumatoid Arthritis, Osteoarthritis, Osteomyelitis Neurologic Denies history of Dementia,  Neuropathy, Quadriplegia, Paraplegia, Seizure Disorder Oncologic Denies history of Received Chemotherapy, Received Radiation Psychiatric Denies history of Anorexia/bulimia, Confinement Anxiety Patient is treated with Insulin. Blood sugar is tested. Blood sugar results noted at the following times: Breakfast - 100, Dinner - 150. Review of Systems (ROS) Constitutional Symptoms (General Health) Denies complaints or symptoms of Fatigue, Fever, Chills, Marked Weight Change. Eyes Denies complaints or symptoms of Dry Eyes, Vision Changes, Glasses / Contacts. Ear/Nose/Mouth/Throat Denies complaints or symptoms of Chronic sinus problems or rhinitis. Respiratory Denies complaints or symptoms of Chronic or frequent coughs, Shortness of Breath. Gastrointestinal Denies complaints or symptoms of Frequent diarrhea, Nausea, Vomiting. Endocrine Denies complaints or symptoms of Heat/cold intolerance. Genitourinary Denies complaints or symptoms of Frequent urination. Integumentary (Skin) Complains or has symptoms of Wounds. Musculoskeletal Denies complaints or symptoms of Muscle Pain, Muscle Weakness. Neurologic Denies complaints or symptoms of Numbness/parasthesias. Psychiatric Denies complaints or symptoms of Claustrophobia, Suicidal. Objective Constitutional Patient is hypertensive.. Pulse regular and within target range for patient.Marland Kitchen Respirations regular, non-labored and within target range.. Temperature is normal and within the target range for the patient.Marland Kitchen Appears in no distress. Vitals Time Taken: 3:32 PM, Height: 74 in, Source: Stated, Weight: 345 lbs, Source: Stated, BMI: 44.3, Temperature: 98.2 F, Pulse: 101 bpm, Respiratory Rate: 20 breaths/min, Blood Pressure: 151/88 mmHg. Respiratory work of breathing is normal. Bilateral breath sounds are clear and equal in all lobes with no wheezes, rales or rhonchi.. Cardiovascular Soft midsystolic murmur JVP about 6 cm at 45 degrees. Minimal  coccyx edema. Pedal pulses are palpable. 2+ pitting edema in both legs. Psychiatric appears at normal baseline. General Notes: Wound exam ooThe wounds on his bilateral lower legs look like remanence of blisters. Generally clean superficial surfaces. These are bilateral. ooHe has the original surgical wound on the right lateral foot. This has more substance to it debris on the surface washed off with wound cleanser and gauze. ooRight forearm abrasion/skin tear Integumentary (Hair, Skin) Wound #1 status is Open. Original cause of wound was Gradually Appeared. The wound is located on the Left,Proximal,Anterior Lower Leg. The wound measures 2cm length x 2.2cm width x 0.1cm depth; 3.456cm^2 area and 0.346cm^3 volume. There is Fat Layer (Subcutaneous Tissue) Exposed exposed. There is no tunneling or undermining noted. There is a medium amount of serosanguineous drainage noted. There is medium (34-66%) red granulation within the wound bed. There is a medium (34-66%) amount of necrotic tissue within the wound bed including Adherent Slough. Wound #10 status is Open. Original cause of wound was Gradually Appeared. The  wound is located on the Left [name] Second. The wound measures 2.5cm length oe x 2cm width x 0.1cm depth; 3.927cm^2 area and 0.393cm^3 volume. There is Fat Layer (Subcutaneous Tissue) Exposed exposed. There is no tunneling or undermining noted. There is a medium amount of serosanguineous drainage noted. The wound margin is flat and intact. There is large (67-100%) pink granulation within the wound bed. There is a small (1-33%) amount of necrotic tissue within the wound bed including Adherent Slough. Wound #2 status is Open. Original cause of wound was Gradually Appeared. The wound is located on the Left,Lateral Lower Leg. The wound measures 1cm length x 0.4cm width x 0.1cm depth; 0.314cm^2 area and 0.031cm^3 volume. There is Fat Layer (Subcutaneous Tissue) Exposed exposed. There is a  medium amount of serosanguineous drainage noted. There is medium (34-66%) pink granulation within the wound bed. There is a medium (34-66%) amount of necrotic tissue within the wound bed including Adherent Slough. Wound #3 status is Open. Original cause of wound was Gradually Appeared. The wound is located on the Left,Medial Lower Leg. The wound measures 1cm length x 0.8cm width x 0.1cm depth; 0.628cm^2 area and 0.063cm^3 volume. There is Fat Layer (Subcutaneous Tissue) Exposed exposed. There is no tunneling or undermining noted. There is a medium amount of serosanguineous drainage noted. There is medium (34-66%) red, pink granulation within the wound bed. There is a medium (34-66%) amount of necrotic tissue within the wound bed including Adherent Slough. Wound #4 status is Open. Original cause of wound was Gradually Appeared. The wound is located on the Right [name] Second. The wound measures 1.2cm length oe x 2cm width x 0.1cm depth; 1.885cm^2 area and 0.188cm^3 volume. There is Fat Layer (Subcutaneous Tissue) Exposed exposed. There is no tunneling or undermining noted. There is a medium amount of serosanguineous drainage noted. There is large (67-100%) red granulation within the wound bed. Wound #5 status is Open. Original cause of wound was Gradually Appeared. The wound is located on the Right,Medial Lower Leg. The wound measures 2.1cm length x 1.5cm width x 0.1cm depth; 2.474cm^2 area and 0.247cm^3 volume. There is Fat Layer (Subcutaneous Tissue) Exposed exposed. There is no tunneling or undermining noted. There is a medium amount of serosanguineous drainage noted. There is large (67-100%) red granulation within the wound bed. There is a small (1-33%) amount of necrotic tissue within the wound bed including Adherent Slough. Wound #6 status is Open. Original cause of wound was Gradually Appeared. The wound is located on the Right,Lateral Lower Leg. The wound measures 10cm length x 1cm width x 0.1cm  depth; 7.854cm^2 area and 0.785cm^3 volume. There is Fat Layer (Subcutaneous Tissue) Exposed exposed. There is no tunneling or undermining noted. There is a medium amount of serosanguineous drainage noted. There is large (67-100%) red granulation within the wound bed. There is a small (1-33%) amount of necrotic tissue within the wound bed including Adherent Slough. Wound #7 status is Open. Original cause of wound was Gradually Appeared. The wound is located on the Right,Lateral Foot. The wound measures 4.5cm length x 3cm width x 0.7cm depth; 10.603cm^2 area and 7.422cm^3 volume. There is Fat Layer (Subcutaneous Tissue) Exposed exposed. There is no tunneling or undermining noted. There is a medium amount of serosanguineous drainage noted. There is small (1-33%) red, pink granulation within the wound bed. There is a large (67-100%) amount of necrotic tissue within the wound bed including Adherent Slough. Wound #8 status is Open. Original cause of wound was Gradually Appeared. The wound is  located on the Right,Proximal,Lateral Foot. The wound measures 0.4cm length x 0.4cm width x 0.4cm depth; 0.126cm^2 area and 0.05cm^3 volume. There is no tunneling or undermining noted. There is a medium amount of serosanguineous drainage noted. There is medium (34-66%) pink granulation within the wound bed. There is a medium (34-66%) amount of necrotic tissue within the wound bed including Adherent Slough. Wound #9 status is Open. Original cause of wound was Trauma. The wound is located on the Left Forearm. The wound measures 6cm length x 3.5cm width x 0.1cm depth; 16.493cm^2 area and 1.649cm^3 volume. There is Fat Layer (Subcutaneous Tissue) Exposed exposed. There is no tunneling or undermining noted. There is a medium amount of serosanguineous drainage noted. There is large (67-100%) red granulation within the wound bed. There is a small (1-33%) amount of necrotic tissue within the wound bed including Adherent  Slough. Assessment Active Problems ICD-10 Type 2 diabetes mellitus with foot ulcer Disruption of external operation (surgical) wound, not elsewhere classified, initial encounter Non-pressure chronic ulcer of other part of right lower leg limited to breakdown of skin Non-pressure chronic ulcer of other part of left lower leg limited to breakdown of skin Abrasion, left foot, subsequent encounter Abrasion of left forearm, subsequent encounter Type 2 diabetes mellitus with diabetic chronic kidney disease Procedures Wound #1 Pre-procedure diagnosis of Wound #1 is a Diabetic Wound/Ulcer of the Lower Extremity located on the Left,Proximal,Anterior Lower Leg . There was a Four Layer Compression Therapy Procedure by [name], RN. Post procedure Diagnosis Wound #1: Same as Pre-Procedure Wound #2 Pre-procedure diagnosis of Wound #2 is a Diabetic Wound/Ulcer of the Lower Extremity located on the Left,Lateral Lower Leg . There was a Four Layer Compression Therapy Procedure by [name], RN. Post procedure Diagnosis Wound #2: Same as Pre-Procedure Wound #3 Pre-procedure diagnosis of Wound #3 is a Diabetic Wound/Ulcer of the Lower Extremity located on the Left,Medial Lower Leg . There was a Four Layer Compression Therapy Procedure by [name], RN. Post procedure Diagnosis Wound #3: Same as Pre-Procedure Wound #5 Pre-procedure diagnosis of Wound #5 is a Diabetic Wound/Ulcer of the Lower Extremity located on the Right,Medial Lower Leg . There was a Four Layer Compression Therapy Procedure by [name], RN. Post procedure Diagnosis Wound #5: Same as Pre-Procedure Wound #6 Pre-procedure diagnosis of Wound #6 is a Diabetic Wound/Ulcer of the Lower Extremity located on the Right,Lateral Lower Leg . There was a Four Layer Compression Therapy Procedure by [name], RN. Post procedure Diagnosis Wound #6: Same as Pre-Procedure Wound #8 Pre-procedure diagnosis of Wound #8 is a  Diabetic Wound/Ulcer of the Lower Extremity located on the Right,Proximal,Lateral Foot . There was a Four Layer Compression Therapy Procedure by [name], RN. Post procedure Diagnosis Wound #8: Same as Pre-Procedure Plan Follow-up Appointments: Return Appointment in 1 week. - Thursday - ****EXTRA TIME - 75 MINUTES**** Dressing Change Frequency: Change dressing three times week. - all wounds (wound clinic to change on Monday, home health to change on Wednesday and Friday) Skin Barriers/Peri-Wound Care: Moisturizing lotion - both legs Wound Cleansing: Clean wound with Normal Saline. - or wound cleanser Primary Wound Dressing: Wound #1 Left,Proximal,Anterior Lower Leg: Calcium Alginate with Silver Wound #10 Left [name] Second: oe Calcium Alginate with Silver Wound #2 Left,Lateral Lower Leg: Calcium Alginate with Silver Wound #3 Left,Medial Lower Leg: Calcium Alginate with Silver Wound #4 Right [name] Second: oe Calcium Alginate with Silver Wound #5 Right,Medial Lower Leg: Calcium Alginate with Silver Wound #6 Right,Lateral Lower Leg: Calcium Alginate  with Silver Wound #7 Right,Lateral Foot: Calcium Alginate with Silver Wound #8 Right,Proximal,Lateral Foot: Calcium Alginate with Silver Wound #9 Left Forearm: Xeroform Secondary Dressing: Dry Gauze - all wounds ABD pad - all wounds on legs Wound #10 Left [name] Second: oe Kerlix/Rolled Gauze Dry Gauze Wound #4 Right [name] Second: oe Kerlix/Rolled Gauze Dry Gauze Wound #9 Left Forearm: Kerlix/Rolled Gauze Dry Gauze Edema Control: 4 layer compression - Bilateral Avoid standing for long periods of time Elevate legs to the level of the heart or above for 30 minutes daily and/or when sitting, a frequency of: - throughout the day Exercise regularly Home Health: [facility] for wound care. - [facility] 1. After careful review of his arterial status his peripheral pulses are palpable everywhere in his lower  extremities and his ABIs were normal I was comfortable putting him in 4 layer compression 2. Most of the wounds on his lower extremities probably all of them are related to blistering from fluid overload that part of this is better. Most of his volume excess secondary to acute on chronic renal failure. 3. Surgical wound on the right lateral foot and a significant abrasion of the left second toe secondary to stubbing it on the shower door. 4. We use silver alginate on all wounds under 4-layer compression. I think controlling his swelling should result in healing of a lot of the wounds that were initially secondary to blisters in the setting of fluid volume overload 5. We put Xeroform and gauze to the left forearm skin tear 6. His creatinine at 7.45 on 7/5 is a bit concerning although he is seeing nephrology and is following with them. I spent 45 minutes in review of this patient's past medical record, face-to-face evaluation and preparation of this record Electronic Signature(s) Signed: 10/23/2019 6:00:25 PM By: Linton Ham MD Entered By: Linton Ham on 10/23/2019 17:54:49 -------------------------------------------------------------------------------- HxROS Details Patient Name: Date of Service: Jeffery [name]. 10/23/2019 2:45 PM Medical Record Number: [id number] Patient Account Number: [id number] Date of Birth/Sex: Treating RN: Jan 03, 1961 ([name]) Carlene Coria Primary Care Provider: [name] Other Clinician: Referring Provider: Treating Provider/Extender: [name] Weeks in Treatment: 0 Information Obtained From Patient Constitutional Symptoms (General Health) Complaints and Symptoms: Negative for: Fatigue; Fever; Chills; Marked Weight Change Eyes Complaints and Symptoms: Negative for: Dry Eyes; Vision Changes; Glasses / Contacts Medical History: Negative for: Cataracts; Glaucoma; Optic Neuritis Ear/Nose/Mouth/Throat Complaints and  Symptoms: Negative for: Chronic sinus problems or rhinitis Medical History: Negative for: Chronic sinus problems/congestion; Middle ear problems Respiratory Complaints and Symptoms: Negative for: Chronic or frequent coughs; Shortness of Breath Medical History: Negative for: Aspiration; Asthma; Chronic Obstructive Pulmonary Disease (COPD); Pneumothorax; Sleep Apnea; Tuberculosis Gastrointestinal Complaints and Symptoms: Negative for: Frequent diarrhea; Nausea; Vomiting Medical History: Negative for: Cirrhosis ; Colitis; Crohns; Hepatitis A; Hepatitis B; Hepatitis C Endocrine Complaints and Symptoms: Negative for: Heat/cold intolerance Medical History: Positive for: Type II Diabetes - 6 Negative for: Type I Diabetes Treated with: Insulin Blood sugar tested every day: Yes Tested : 2 times per day Blood sugar testing results: Breakfast: 100; Dinner: 150 Genitourinary Complaints and Symptoms: Negative for: Frequent urination Medical History: Negative for: End Stage Renal Disease Integumentary (Skin) Complaints and Symptoms: Positive for: Wounds Medical History: Negative for: History of Burn Musculoskeletal Complaints and Symptoms: Negative for: Muscle Pain; Muscle Weakness Medical History: Negative for: Gout; Rheumatoid Arthritis; Osteoarthritis; Osteomyelitis Neurologic Complaints and Symptoms: Negative for: Numbness/parasthesias Medical History: Negative for: Dementia; Neuropathy; Quadriplegia; Paraplegia;  Seizure Disorder Psychiatric Complaints and Symptoms: Negative for: Claustrophobia; Suicidal Medical History: Negative for: Anorexia/bulimia; Confinement Anxiety Hematologic/Lymphatic Medical History: Negative for: Anemia; Hemophilia; Human Immunodeficiency Virus; Lymphedema; Sickle Cell Disease Cardiovascular Medical History: Positive for: Congestive Heart Failure; Hypertension Negative for: Angina; Arrhythmia; Coronary Artery Disease; Deep Vein Thrombosis;  Hypotension; Myocardial Infarction; Peripheral Arterial Disease; Peripheral Venous Disease; Phlebitis; Vasculitis Immunological Medical History: Negative for: Lupus Erythematosus; Raynauds; Scleroderma Oncologic Medical History: Negative for: Received Chemotherapy; Received Radiation Immunizations Pneumococcal Vaccine: Received Pneumococcal Vaccination: No Implantable Devices None Family and Social History Cancer: No; Diabetes: Yes - Paternal Grandparents,Father,Siblings; Heart Disease: Yes - Father; Hereditary Spherocytosis: No; Hypertension: Yes - Siblings,Father,Paternal Grandparents; Kidney Disease: No; Lung Disease: No; Seizures: No; Stroke: Yes - Maternal Grandparents; Thyroid Problems: No; Tuberculosis: No; Former smoker; Marital Status - Single; Alcohol Use: Never; Drug Use: Prior History - cocaine; Caffeine Use: Daily; Financial Concerns: No; Food, Clothing or Shelter Needs: No; Support System Lacking: No; Transportation Concerns: No Electronic Signature(s) Signed: 10/23/2019 6:00:25 PM By: Linton Ham MD Signed: 10/25/2019 6:04:59 PM By: Carlene Coria RN Entered By: Carlene Coria on 10/23/2019 15:47:53 -------------------------------------------------------------------------------- SuperBill Details Patient Name: Date of Service: Jeffery [name]. 10/23/2019 Medical Record Number: [id number] Patient Account Number: [id number] Date of Birth/Sex: Treating RN: [birth year] ([name]) [name] Primary Care Provider: [name] Other Clinician: Referring Provider: Treating Provider/Extender: Kandice Robinsons, Charlane Ferretti Weeks in Treatment: 0 Diagnosis Coding ICD-10 Codes Code Description E11.621 Type 2 diabetes mellitus with foot ulcer T81.31XA Disruption of external operation (surgical) wound, not elsewhere classified, initial encounter L97.811 Non-pressure chronic ulcer of other part of right lower leg limited to breakdown of skin L97.821  Non-pressure chronic ulcer of other part of left lower leg limited to breakdown of skin S90.812D Abrasion, left foot, subsequent encounter S50.812D Abrasion of left forearm, subsequent encounter E11.22 Type 2 diabetes mellitus with diabetic chronic kidney disease Facility Procedures CPT4: Code [id number] Description: 13 - WOUND CARE VISIT-LEV 3 EST PT Modifier: 25 Quantity: 1 CPT4: [id number] Description: 81 BILATERAL: Application of multi-layer venous compression system; leg (below knee), including ankle and [name]. Modifier: Quantity: 1 Physician Procedures : CPT4 Code Description Modifier [id number] - WC PHYS LEVEL 4 - NEW PT ICD-10 Diagnosis Description T81.31XA Disruption of external operation (surgical) wound, not elsewhere classified, initial encounter L97.811 Non-pressure chronic ulcer of other  part of right lower leg limited to breakdown of skin L97.821 Non-pressure chronic ulcer of other part of left lower leg limited to breakdown of skin E11.621 Type 2 diabetes mellitus with foot ulcer Quantity: 1 Electronic Signature(s) Signed: 10/25/2019 3:52:22 AM By: Linton Ham MD Signed: 10/25/2019 6:13:34 PM By: [name] RN, BSN Previous Signature: 10/23/2019 6:00:25 PM Version By: Linton Ham MD Entered By: [name] on 10/23/2019 18:12:35

## 2019-10-25 NOTE — Progress Notes (Signed)
Jeffery [name], Jeffery [name] ([id number]) . Visit Report for 10/23/2019 Allergy List Details Patient Name: Date of Service: Jeffery Porter [name]. 10/23/2019 2:45 PM Medical Record Number: [id number] Patient Account Number: [id number] Date of Birth/Sex: Treating RN: 1960-11-26 ([name]) Carlene Coria Primary Care Joci Dress: [name] Other Clinician: Referring Latrenda Irani: Treating Nicey Krah/Extender: Kandice Robinsons, Charlane Ferretti Weeks in Treatment: 0 Allergies Active Allergies metformin Allergy Notes Electronic Signature(s) Signed: 10/25/2019 6:04:59 PM By: Carlene Coria RN Entered By: Carlene Coria on 10/23/2019 15:40:55 -------------------------------------------------------------------------------- Arrival Information Details Patient Name: Date of Service: Jeffery Porter [name]. 10/23/2019 2:45 PM Medical Record Number: [id number] Patient Account Number: [id number] Date of Birth/Sex: Treating RN: [birth year] (58 y.o. Male) Carlene Coria Primary Care Neddie Steedman: [name] Other Clinician: Referring Klyn Kroening: Treating Tivon Lemoine/Extender: [name] in Treatment: 0 Visit Information Patient Arrived: Wheel Chair Arrival Time: 15:32 Accompanied By: self Transfer Assistance: None Patient Identification Verified: Yes Secondary Verification Process Completed: Yes Patient Requires Transmission-Based Precautions: No Patient Has Alerts: No Electronic Signature(s) Signed: 10/25/2019 6:04:59 PM By: Carlene Coria RN Entered By: Carlene Coria on 10/23/2019 15:32:38 -------------------------------------------------------------------------------- Clinic Level of Care Assessment Details Patient Name: Date of Service: Jeffery [name] 10/23/2019 2:45 PM Medical Record Number: [id number] Patient Account Number: [id number] Date of Birth/Sex: Treating RN: 1960/08/29 (58 y.o. Male) [name] Primary Care Aubrynn Katona: [name] Other Clinician: Referring  Shalan Neault: Treating Maiah Sinning/Extender: [name] Weeks in Treatment: 0 Clinic Level of Care Assessment Items TOOL 1 Quantity Score X- 1 0 Use when EandM and Procedure is performed on INITIAL visit ASSESSMENTS - Nursing Assessment / Reassessment X- 1 20 General Physical Exam (combine w/ comprehensive assessment (listed just below) when performed on new pt. evals) X- 1 25 Comprehensive Assessment (HX, ROS, Risk Assessments, Wounds Hx, etc.) ASSESSMENTS - Wound and Skin Assessment / Reassessment []  - 0 Dermatologic / Skin Assessment (not related to wound area) ASSESSMENTS - Ostomy and/or Continence Assessment and Care []  - 0 Incontinence Assessment and Management []  - 0 Ostomy Care Assessment and Management (repouching, etc.) PROCESS - Coordination of Care X - Simple Patient / Family Education for ongoing care 1 15 []  - 0 Complex (extensive) Patient / Family Education for ongoing care X- 1 10 Staff obtains Programmer, systems, Records, [name] Results / Process Orders est X- 1 10 Staff telephones HHA, Nursing Homes / Clarify orders / etc []  - 0 Routine Transfer to another Facility (non-emergent condition) []  - 0 Routine Hospital Admission (non-emergent condition) X- 1 15 New Admissions / Biomedical engineer / Ordering NPWT Apligraf, etc. , []  - 0 Emergency Hospital Admission (emergent condition) PROCESS - Special Needs []  - 0 Pediatric / Minor Patient Management []  - 0 Isolation Patient Management []  - 0 Hearing / Language / Visual special needs []  - 0 Assessment of Community assistance (transportation, D/C planning, etc.) []  - 0 Additional assistance / Altered mentation []  - 0 Support Surface(s) Assessment (bed, cushion, seat, etc.) INTERVENTIONS - Miscellaneous []  - 0 External ear exam []  - 0 Patient Transfer (multiple staff / Civil Service fast streamer / Similar devices) []  - 0 Simple Staple / Suture removal (25 or less) []  - 0 Complex Staple / Suture removal (26  or more) []  - 0 Hypo/Hyperglycemic Management (do not check if billed separately) X- 1 15 Ankle / Brachial Index (ABI) - do not check if billed separately Has the patient been seen at the hospital within the last three years: Yes Total Score: 110 Level Of Care: New/Established -  Level 3 Electronic Signature(s) Signed: 10/25/2019 6:13:34 PM By: [name] RN, BSN Entered By: [name] on 10/23/2019 18:12:19 -------------------------------------------------------------------------------- Compression Therapy Details Patient Name: Date of Service: Jeffery Porter [name]. 10/23/2019 2:45 PM Medical Record Number: [id number] Patient Account Number: [id number] Date of Birth/Sex: Treating RN: 11/05/1960 (58 y.o. Male) [name] Primary Care Takina Busser: [name] Other Clinician: Referring Destine Ambroise: Treating Zoya Sprecher/Extender: [name] Weeks in Treatment: 0 Compression Therapy Performed for Wound Assessment: Wound #1 Left,Proximal,Anterior Lower Leg Performed By: Clinician [name], RN Compression Type: Four Layer Post Procedure Diagnosis Same as Pre-procedure Electronic Signature(s) Signed: 10/25/2019 6:13:34 PM By: [name] RN, BSN Entered By: [name] on 10/23/2019 17:19:31 -------------------------------------------------------------------------------- Compression Therapy Details Patient Name: Date of Service: Jeffery Porter [name]. 10/23/2019 2:45 PM Medical Record Number: [id number] Patient Account Number: [id number] Date of Birth/Sex: Treating RN: 1960/05/26 (58 y.o. Male) [name] Primary Care Bernedette Auston: [name] Other Clinician: Referring Keiarah Orlowski: Treating Carsen Machi/Extender: [name] Weeks in Treatment: 0 Compression Therapy Performed for Wound Assessment: Wound #2 Left,Lateral Lower Leg Performed By: Clinician [name], RN Compression Type: Four Layer Post Procedure  Diagnosis Same as Pre-procedure Electronic Signature(s) Signed: 10/25/2019 6:13:34 PM By: [name] RN, BSN Entered By: [name] on 10/23/2019 17:19:31 -------------------------------------------------------------------------------- Compression Therapy Details Patient Name: Date of Service: Jeffery Porter [name]. 10/23/2019 2:45 PM Medical Record Number: [id number] Patient Account Number: [id number] Date of Birth/Sex: Treating RN: 01-Apr-1960 (58 y.o. Male) [name] Primary Care Antoneo Ghrist: [name] Other Clinician: Referring Autumne Kallio: Treating Kasson Lamere/Extender: [name] Weeks in Treatment: 0 Compression Therapy Performed for Wound Assessment: Wound #3 Left,Medial Lower Leg Performed By: Clinician [name], RN Compression Type: Four Layer Post Procedure Diagnosis Same as Pre-procedure Electronic Signature(s) Signed: 10/25/2019 6:13:34 PM By: [name] RN, BSN Entered By: [name] on 10/23/2019 17:19:31 -------------------------------------------------------------------------------- Compression Therapy Details Patient Name: Date of Service: Jeffery Porter [name]. 10/23/2019 2:45 PM Medical Record Number: [id number] Patient Account Number: [id number] Date of Birth/Sex: Treating RN: 11-23-1960 (58 y.o. Male) [name] Primary Care Enedelia Martorelli: [name] Other Clinician: Referring Kenry Daubert: Treating Jennie Bolar/Extender: [name] Weeks in Treatment: 0 Compression Therapy Performed for Wound Assessment: Wound #5 Right,Medial Lower Leg Performed By: Clinician [name], RN Compression Type: Four Layer Post Procedure Diagnosis Same as Pre-procedure Electronic Signature(s) Signed: 10/25/2019 6:13:34 PM By: [name] RN, BSN Entered By: [name] on 10/23/2019 17:19:31 -------------------------------------------------------------------------------- Compression Therapy  Details Patient Name: Date of Service: Jeffery Porter [name]. 10/23/2019 2:45 PM Medical Record Number: [id number] Patient Account Number: [id number] Date of Birth/Sex: Treating RN: 1961-02-21 (58 y.o. Male) [name] Primary Care Delorse Shane: [name] Other Clinician: Referring Gavriela Cashin: Treating Sabino Denning/Extender: [name] Weeks in Treatment: 0 Compression Therapy Performed for Wound Assessment: Wound #6 Right,Lateral Lower Leg Performed By: Clinician [name], RN Compression Type: Four Layer Post Procedure Diagnosis Same as Pre-procedure Electronic Signature(s) Signed: 10/25/2019 6:13:34 PM By: [name] RN, BSN Entered By: [name] on 10/23/2019 17:19:31 -------------------------------------------------------------------------------- Compression Therapy Details Patient Name: Date of Service: Jeffery Porter [name]. 10/23/2019 2:45 PM Medical Record Number: [id number] Patient Account Number: [id number] Date of Birth/Sex: Treating RN: 1960/10/21 (58 y.o. Male) [name] Primary Care Neno Hohensee: Other Clinician: [name] Referring Abria Vannostrand: Treating Charnae Lill/Extender: [name] Weeks in Treatment: 0 Compression Therapy Performed for Wound Assessment: Wound #8 Right,Proximal,Lateral Foot Performed By: Clinician [name], RN Compression Type: Four Layer Post Procedure Diagnosis  Same as Pre-procedure Electronic Signature(s) Signed: 10/25/2019 6:13:34 PM By: [name] RN, BSN Entered By: [name] on 10/23/2019 17:19:31 -------------------------------------------------------------------------------- Encounter Discharge Information Details Patient Name: Date of Service: Jeffery Porter [name]. 10/23/2019 2:45 PM Medical Record Number: [id number] Patient Account Number: [id number] Date of Birth/Sex: Treating RN: December 14, 1960 (58 y.o. Male) Baruch Gouty Primary Care Weltha Cathy:  [name] Other Clinician: Referring Nimesh Riolo: Treating Annabeth Tortora/Extender: [name] in Treatment: 0 Encounter Discharge Information Items Discharge Condition: Stable Ambulatory Status: Wheelchair Discharge Destination: Home Transportation: Private Auto Accompanied By: mother Schedule Follow-up Appointment: Yes Clinical Summary of Care: Patient Declined Electronic Signature(s) Signed: 10/23/2019 6:12:56 PM By: Baruch Gouty RN, BSN Entered By: Baruch Gouty on 10/23/2019 17:31:23 -------------------------------------------------------------------------------- Lower Extremity Assessment Details Patient Name: Date of Service: Jeffery Porter [name]. 10/23/2019 2:45 PM Medical Record Number: [id number] Patient Account Number: [id number] Date of Birth/Sex: Treating RN: [birth year] (58 y.o. Male) Carlene Coria Primary Care Jazmen Lindenbaum: [name] Other Clinician: Referring Nickey Canedo: Treating Gracelin Weisberg/Extender: [name] Weeks in Treatment: 0 Edema Assessment Assessed: [name]: No] [Right: No] E[Left: dema] [Right: :] Calf Left: Right: Point of Measurement: 48 cm From Medial Instep 49 cm 42 cm Ankle Left: Right: Point of Measurement: 14 cm From Medial Instep 29 cm 25 cm Vascular Assessment Blood Pressure: Brachial: [Left:151] [Right:151] Ankle: [Left:Dorsalis Pedis: 180 1.19] [Right:Dorsalis Pedis: 178 1.18] Electronic Signature(s) Signed: 10/25/2019 6:04:59 PM By: Carlene Coria RN Entered By: Carlene Coria on 10/23/2019 16:17:19 -------------------------------------------------------------------------------- Multi Wound Chart Details Patient Name: Date of Service: Jeffery Porter [name]. 10/23/2019 2:45 PM Medical Record Number: [id number] Patient Account Number: [id number] Date of Birth/Sex: Treating RN: 1960/05/07 (58 y.o. Male) [name] Primary Care Idona Stach: [name] Other Clinician: Referring  Okie Bogacz: Treating Khaylee Mcevoy/Extender: Kandice Robinsons, Charlane Ferretti Weeks in Treatment: 0 Vital Signs Height(in): 74 Pulse(bpm): 101 Weight(lbs): 345 Blood Pressure(mmHg): 151/88 Body Mass Index(BMI): 44 Temperature(F): 98.2 Respiratory Rate(breaths/min): 20 Photos: [1:No Photos Left, Proximal Lower Leg] [10:No Photos Left [name] Second oe] [2:No Photos Left, Lateral Lower Leg] Wound Location: [1:Gradually Appeared] [10:Gradually Appeared] [2:Gradually Appeared] Wounding Event: [1:Diabetic Wound/Ulcer of the Lower] [10:Diabetic Wound/Ulcer of the Lower] [2:Diabetic Wound/Ulcer of the Lower] Primary Etiology: [1:Extremity Congestive Heart Failure,] [10:Extremity Congestive Heart Failure,] [2:Extremity Congestive Heart Failure,] Comorbid History: [1:Hypertension, Type II Diabetes 08/09/2019] [10:Hypertension, Type II Diabetes 10/23/2019] [2:Hypertension, Type II Diabetes 08/09/2019] Date Acquired: [1:0] [10:0] [2:0] Weeks of Treatment: [1:Open] [10:Open] [2:Open] Wound Status: [1:No] [10:No] [2:No] Clustered Wound: [1:2x2.2x0.1] [10:2.5x2x0.1] [2:1x0.4x0.1] Measurements L x W x D (cm) [1:3.456] [10:3.927] [2:0.314] A (cm) : rea [1:0.346] [10:0.393] [2:0.031] Volume (cm) : [1:N/A] [10:N/A] [2:N/A] % Reduction in A rea: [1:N/A] [10:N/A] [2:N/A] % Reduction in Volume: [1:Grade 2] [10:Grade 2] [2:Grade 2] Classification: [1:Medium] [10:Medium] [2:Medium] Exudate A mount: [1:Serosanguineous] [10:Serosanguineous] [2:Serosanguineous] Exudate Type: [1:red, Weikel] [10:red, Carmean] [2:red, Gerardo] Exudate Color: [1:N/A] [10:Flat and Intact] [2:N/A] Wound Margin: [1:Medium (34-66%)] [10:Large (67-100%)] [2:Medium (34-66%)] Granulation A mount: [1:Red] [10:Pink] [2:Pink] Granulation Quality: [1:Medium (34-66%)] [10:Small (1-33%)] [2:Medium (34-66%)] Necrotic A mount: [1:Fat Layer (Subcutaneous Tissue)] [10:Fat Layer (Subcutaneous Tissue)] [2:Fat Layer (Subcutaneous Tissue)] Exposed  Structures: [1:Exposed: Yes Fascia: No Tendon: No Muscle: No Joint: No Bone: No None] [10:Exposed: Yes Fascia: No Tendon: No Muscle: No Joint: No Bone: No None] [2:Exposed: Yes Fascia: No Tendon: No Muscle: No Joint: No Bone: No None] Epithelialization: [1:Compression Therapy] [10:N/A] [2:Compression Therapy] Wound Number: [id number]  Photos: No Photos No Photos No Photos Left, Medial Lower Leg Right [name] Second oe Right,  Medial Lower Leg Wound Location: Gradually Appeared Gradually Appeared Gradually Appeared Wounding Event: Diabetic Wound/Ulcer of the Lower Diabetic Wound/Ulcer of the Lower Diabetic Wound/Ulcer of the Lower Primary Etiology: Extremity Extremity Extremity Congestive Heart Failure, Congestive Heart Failure, Congestive Heart Failure, Comorbid History: Hypertension, Type II Diabetes Hypertension, Type II Diabetes Hypertension, Type II Diabetes 08/09/2019 09/28/2019 08/09/2019 Date Acquired: 0 0 0 Weeks of Treatment: Open Open Open Wound Status: No No No Clustered Wound: 1x0.8x0.1 1.2x2x0.1 2.1x1.5x0.1 Measurements L x W x D (cm) 0.628 1.885 2.474 A (cm) : rea 0.063 0.188 0.247 Volume (cm) : N/A N/A N/A % Reduction in A rea: N/A N/A N/A % Reduction in Volume: Grade 2 Grade 2 Grade 2 Classification: Medium Medium Medium Exudate A mount: Serosanguineous Serosanguineous Serosanguineous Exudate Type: red, Otoole red, Berns red, Elsbernd Exudate Color: N/A N/A N/A Wound Margin: Medium (34-66%) Large (67-100%) Large (67-100%) Granulation A mount: Red, Pink Red Red Granulation Quality: Medium (34-66%) N/A Small (1-33%) Necrotic A mount: Fat Layer (Subcutaneous Tissue) Fat Layer (Subcutaneous Tissue) Fat Layer (Subcutaneous Tissue) Exposed Structures: Exposed: Yes Exposed: Yes Exposed: Yes Fascia: No Fascia: No Fascia: No Tendon: No Tendon: No Tendon: No Muscle: No Muscle: No Muscle: No Joint: No Joint: No Joint: No Bone: No Bone: No Bone: No None None  None Epithelialization: Compression Therapy N/A Compression Therapy Procedures Performed: Wound Number: [id number]  Photos: No Photos No Photos No Photos Right, Lateral Lower Leg Right, Lateral Foot Right, Proximal Foot Wound Location: Gradually Appeared Gradually Appeared Gradually Appeared Wounding Event: Diabetic Wound/Ulcer of the Lower Diabetic Wound/Ulcer of the Lower Diabetic Wound/Ulcer of the Lower Primary Etiology: Extremity Extremity Extremity Congestive Heart Failure, Congestive Heart Failure, Congestive Heart Failure, Comorbid History: Hypertension, Type II Diabetes Hypertension, Type II Diabetes Hypertension, Type II Diabetes 08/09/2019 08/02/2019 08/02/2019 Date Acquired: 0 0 0 Weeks of Treatment: Open Open Open Wound Status: Yes No No Clustered Wound: 10x1x0.1 4.5x3x0.7 0.4x0.4x0.4 Measurements L x W x D (cm) 7.854 10.603 0.126 A (cm) : rea 0.785 7.422 0.05 Volume (cm) : 0.00% N/A N/A % Reduction in A rea: 0.00% N/A N/A % Reduction in Volume: Grade 2 Grade 2 Grade 2 Classification: Medium Medium Medium Exudate A mount: Serosanguineous Serosanguineous Serosanguineous Exudate Type: red, Sorter red, Yeargan red, Granja Exudate Color: N/A N/A N/A Wound Margin: Large (67-100%) Small (1-33%) Medium (34-66%) Granulation A mount: Red Red, Pink Pink Granulation Quality: Small (1-33%) Large (67-100%) Medium (34-66%) Necrotic A mount: Fat Layer (Subcutaneous Tissue) Fat Layer (Subcutaneous Tissue) Fascia: No Exposed Structures: Exposed: Yes Exposed: Yes Fat Layer (Subcutaneous Tissue) Fascia: No Fascia: No Exposed: No Tendon: No Tendon: No Tendon: No Muscle: No Muscle: No Muscle: No Joint: No Joint: No Joint: No Bone: No Bone: No Bone: No None None None Epithelialization: Compression Therapy N/A Compression Therapy Procedures Performed: Wound Number: 9 N/A N/A Photos: No Photos N/A N/A Left Forearm N/A N/A Wound Location: Trauma N/A N/A Wounding  Event: Skin Tear N/A N/A Primary Etiology: Congestive Heart Failure, N/A N/A Comorbid History: Hypertension, Type II Diabetes 10/20/2019 N/A N/A Date Acquired: 0 N/A N/A Weeks of Treatment: Open N/A N/A Wound Status: No N/A N/A Clustered Wound: 6x3.5x0.1 N/A N/A Measurements L x W x D (cm) 16.493 N/A N/A A (cm) : rea 1.649 N/A N/A Volume (cm) : N/A N/A N/A % Reduction in Area: N/A N/A N/A % Reduction in Volume: Full Thickness Without Exposed N/A N/A Classification: Support Structures Medium N/A N/A Exudate Amount: Serosanguineous N/A N/A Exudate Type: red, Amara N/A  N/A Exudate Color: N/A N/A N/A Wound Margin: Large (67-100%) N/A N/A Granulation Amount: Red N/A N/A Granulation Quality: Small (1-33%) N/A N/A Necrotic Amount: Fat Layer (Subcutaneous Tissue) N/A N/A Exposed Structures: Exposed: Yes Fascia: No Tendon: No Muscle: No Joint: No Bone: No None N/A N/A Epithelialization: N/A N/A N/A Procedures Performed: Treatment Notes Wound #1 (Left, Proximal, Anterior Lower Leg) 2. Periwound Care Moisturizing lotion 3. Primary Dressing Applied Calcium Alginate Ag 4. Secondary Dressing Dry Gauze 6. Support Layer Applied 4 layer compression wrap Wound #10 (Left Toe Second) 2. Periwound Care Moisturizing lotion 3. Primary Dressing Applied Calcium Alginate Ag 4. Secondary Dressing Dry Gauze 6. Support Layer Applied 4 layer compression wrap Wound #2 (Left, Lateral Lower Leg) 2. Periwound Care Moisturizing lotion 3. Primary Dressing Applied Calcium Alginate Ag 4. Secondary Dressing Dry Gauze 6. Support Layer Applied 4 layer compression wrap Wound #3 (Left, Medial Lower Leg) 2. Periwound Care Moisturizing lotion 3. Primary Dressing Applied Calcium Alginate Ag 4. Secondary Dressing Dry Gauze 6. Support Layer Applied 4 layer compression wrap Wound #4 (Right Toe Second) 2. Periwound Care Moisturizing lotion 3. Primary Dressing  Applied Calcium Alginate Ag 4. Secondary Dressing Dry Gauze 6. Support Layer Applied 4 layer compression wrap Wound #5 (Right, Medial Lower Leg) 2. Periwound Care Moisturizing lotion 3. Primary Dressing Applied Calcium Alginate Ag 4. Secondary Dressing Dry Gauze 6. Support Layer Applied 4 layer compression wrap Wound #6 (Right, Lateral Lower Leg) 2. Periwound Care Moisturizing lotion 3. Primary Dressing Applied Calcium Alginate Ag 4. Secondary Dressing Dry Gauze 6. Support Layer Applied 4 layer compression wrap Wound #7 (Right, Lateral Foot) 2. Periwound Care Moisturizing lotion 3. Primary Dressing Applied Calcium Alginate Ag 4. Secondary Dressing Dry Gauze 6. Support Layer Applied 4 layer compression wrap Wound #8 (Right, Proximal, Lateral Foot) 2. Periwound Care Moisturizing lotion 3. Primary Dressing Applied Calcium Alginate Ag 4. Secondary Dressing Dry Gauze 6. Support Layer Applied 4 layer compression wrap Wound #9 (Left Forearm) 3. Primary Dressing Applied Xeroform Gauze 4. Secondary Dressing Foam Border Dressing Electronic Signature(s) Signed: 10/23/2019 6:00:25 PM By: Linton Ham MD Signed: 10/25/2019 6:13:34 PM By: [name] RN, BSN Entered By: Linton Ham on 10/23/2019 17:42:16 -------------------------------------------------------------------------------- [place] Details Patient Name: Date of Service: Jeffery Porter [name]. 10/23/2019 2:45 PM Medical Record Number: [id number] Patient Account Number: [id number] Date of Birth/Sex: Treating RN: 02-19-1961 (58 y.o. Male) [name] Primary Care Siegfried Vieth: [name] Other Clinician: Referring Avery Eustice: Treating Bowen Goyal/Extender: [name] Weeks in Treatment: 0 Active Inactive Abuse / Safety / Falls / Self Care Management Nursing Diagnoses: Potential for falls Potential for injury related to falls Goals: Patient will remain  injury free related to falls Date Initiated: 10/23/2019 Target Resolution Date: 11/24/2019 Goal Status: Active Patient/caregiver will verbalize/demonstrate measures taken to prevent injury and/or falls Date Initiated: 10/23/2019 Target Resolution Date: 11/24/2019 Goal Status: Active Interventions: Assess Activities of Daily Living upon admission and as needed Assess fall risk on admission and as needed Assess: immobility, friction, shearing, incontinence upon admission and as needed Assess impairment of mobility on admission and as needed per policy Assess personal safety and home safety (as indicated) on admission and as needed Assess self care needs on admission and as needed Provide education on fall prevention Provide education on personal and home safety Notes: Nutrition Nursing Diagnoses: Impaired glucose control: actual or potential Potential for alteratiion in Nutrition/Potential for imbalanced nutrition Goals: Patient/caregiver agrees to and verbalizes understanding of need to use nutritional supplements and/or  vitamins as prescribed Date Initiated: 10/23/2019 Target Resolution Date: 11/24/2019 Goal Status: Active Patient/caregiver will maintain therapeutic glucose control Date Initiated: 10/23/2019 Target Resolution Date: 11/24/2019 Goal Status: Active Interventions: Assess HgA1c results as ordered upon admission and as needed Assess patient nutrition upon admission and as needed per policy Provide education on elevated blood sugars and impact on wound healing Provide education on nutrition Notes: Venous Leg Ulcer Nursing Diagnoses: Knowledge deficit related to disease process and management Potential for venous Insuffiency (use before diagnosis confirmed) Goals: Patient will maintain optimal edema control Date Initiated: 10/23/2019 Target Resolution Date: 11/24/2019 Goal Status: Active Patient/caregiver will verbalize understanding of disease process and disease  management Date Initiated: 10/23/2019 Target Resolution Date: 11/24/2019 Goal Status: Active Interventions: Assess peripheral edema status every visit. Compression as ordered Provide education on venous insufficiency Notes: Wound/Skin Impairment Nursing Diagnoses: Impaired tissue integrity Knowledge deficit related to ulceration/compromised skin integrity Goals: Patient/caregiver will verbalize understanding of skin care regimen Date Initiated: 10/23/2019 Target Resolution Date: 11/24/2019 Goal Status: Active Interventions: Assess patient/caregiver ability to obtain necessary supplies Assess patient/caregiver ability to perform ulcer/skin care regimen upon admission and as needed Assess ulceration(s) every visit Provide education on ulcer and skin care Notes: Electronic Signature(s) Signed: 10/25/2019 6:13:34 PM By: [name] RN, BSN Entered By: [name] on 10/23/2019 17:09:44 -------------------------------------------------------------------------------- Pain Assessment Details Patient Name: Date of Service: Jeffery Porter [name]. 10/23/2019 2:45 PM Medical Record Number: [id number] Patient Account Number: [id number] Date of Birth/Sex: Treating RN: [birth year] (58 y.o. Male) Carlene Coria Primary Care Kearah Gayden: [name] Other Clinician: Referring Ivyrose Hashman: Treating Lino Wickliff/Extender: Kandice Robinsons, Charlane Ferretti Weeks in Treatment: 0 Active Problems Location of Pain Severity and Description of Pain Patient Has Paino No Site Locations Pain Management and Medication Current Pain Management: Electronic Signature(s) Signed: 10/25/2019 6:04:59 PM By: Carlene Coria RN Entered By: Carlene Coria on 10/23/2019 16:35:24 -------------------------------------------------------------------------------- Patient/Caregiver Education Details Patient Name: Date of Service: Jeffery [name] 7/26/2021andnbsp2:45 PM Medical Record Number: [id number] Patient  Account Number: [id number] Date of Birth/Gender: Treating RN: May 07, 1960 (58 y.o. Male) [name] Primary Care Physician: [name] Other Clinician: Referring Physician: Treating Physician/Extender: [name] Weeks in Treatment: 0 Education Assessment Education Provided To: Patient Education Topics Provided Elevated Blood Sugar/ Impact on Healing: Methods: Explain/Verbal Responses: State content correctly Nutrition: Methods: Explain/Verbal Responses: State content correctly Safety: Methods: Explain/Verbal Responses: State content correctly Venous: Methods: Explain/Verbal Responses: State content correctly Wound/Skin Impairment: Methods: Explain/Verbal Responses: State content correctly Electronic Signature(s) Signed: 10/25/2019 6:13:34 PM By: [name] RN, BSN Entered By: [name] on 10/23/2019 17:11:02 -------------------------------------------------------------------------------- Wound Assessment Details Patient Name: Date of Service: Jeffery Porter [name]. 10/23/2019 2:45 PM Medical Record Number: [id number] Patient Account Number: [id number] Date of Birth/Sex: Treating RN: Nov 28, 1960 (58 y.o. Male) Carlene Coria Primary Care Vaudie Engebretsen: [name] Other Clinician: Referring Jaleeah Slight: Treating Caylynn Minchew/Extender: Kandice Robinsons, Enobong Weeks in Treatment: 0 Wound Status Wound Number: 1 Primary Etiology: Diabetic Wound/Ulcer of the Lower Extremity Wound Location: Left, Proximal Lower Leg Wound Status: Open Wounding Event: Gradually Appeared Comorbid History: Congestive Heart Failure, Hypertension, Type II Diabetes Date Acquired: 08/09/2019 Weeks Of Treatment: 0 Clustered Wound: No Photos Photo Uploaded By: Mikeal Hawthorne on 10/25/2019 12:53:47 Wound Measurements Length: (cm) 2 Width: (cm) 2.2 Depth: (cm) 0.1 Area: (cm) 3.456 Volume: (cm) 0.346 % Reduction in Area: % Reduction in  Volume: Epithelialization: None Tunneling: No Undermining: No Wound Description Classification: Grade 2 Exudate Amount: Medium Exudate Type: Serosanguineous Exudate Color: red, Deguire Foul  Odor After Cleansing: No Slough/Fibrino Yes Wound Bed Granulation Amount: Medium (34-66%) Exposed Structure Granulation Quality: Red Fascia Exposed: No Necrotic Amount: Medium (34-66%) Fat Layer (Subcutaneous Tissue) Exposed: Yes Necrotic Quality: Adherent Slough Tendon Exposed: No Muscle Exposed: No Joint Exposed: No Bone Exposed: No Electronic Signature(s) Signed: 10/25/2019 6:04:59 PM By: Carlene Coria RN Entered By: Carlene Coria on 10/23/2019 16:18:39 -------------------------------------------------------------------------------- Wound Assessment Details Patient Name: Date of Service: Jeffery Porter [name]. 10/23/2019 2:45 PM Medical Record Number: [id number] Patient Account Number: [id number] Date of Birth/Sex: Treating RN: Oct 25, 1960 (58 y.o. Male) [name] Primary Care Floella Ensz: [name] Other Clinician: Referring Kayren Holck: Treating Garin Mata/Extender: [name] Weeks in Treatment: 0 Wound Status Wound Number: 10 Primary Etiology: Diabetic Wound/Ulcer of the Lower Extremity Wound Location: Left [name] Second oe Wound Status: Open Wounding Event: Gradually Appeared Comorbid History: Congestive Heart Failure, Hypertension, Type II Diabetes Date Acquired: 10/23/2019 Weeks Of Treatment: 0 Clustered Wound: No Photos Photo Uploaded By: Mikeal Hawthorne on 10/25/2019 12:56:09 Wound Measurements Length: (cm) 2.5 Width: (cm) 2 Depth: (cm) 0.1 Area: (cm) 3.927 Volume: (cm) 0.393 % Reduction in Area: % Reduction in Volume: Epithelialization: None Tunneling: No Undermining: No Wound Description Classification: Grade 2 Wound Margin: Flat and Intact Exudate Amount: Medium Exudate Type: Serosanguineous Exudate Color: red, Perlow Foul Odor After  Cleansing: No Slough/Fibrino Yes Wound Bed Granulation Amount: Large (67-100%) Exposed Structure Granulation Quality: Pink Fascia Exposed: No Necrotic Amount: Small (1-33%) Fat Layer (Subcutaneous Tissue) Exposed: Yes Necrotic Quality: Adherent Slough Tendon Exposed: No Muscle Exposed: No Joint Exposed: No Bone Exposed: No Treatment Notes Wound #10 (Left Toe Second) 2. Periwound Care Moisturizing lotion 3. Primary Dressing Applied Calcium Alginate Ag 4. Secondary Dressing Dry Gauze 6. Support Layer Applied 4 layer compression Water quality scientist) Signed: 10/25/2019 6:13:34 PM By: [name] RN, BSN Entered By: [name] on 10/23/2019 17:13:14 -------------------------------------------------------------------------------- Wound Assessment Details Patient Name: Date of Service: Jeffery Porter [name]. 10/23/2019 2:45 PM Medical Record Number: [id number] Patient Account Number: [id number] Date of Birth/Sex: Treating RN: Oct 07, 1960 (58 y.o. Male) Carlene Coria Primary Care Olubunmi Rothenberger: [name] Other Clinician: Referring [place]: Treating Future Yeldell/Extender: [name] Weeks in Treatment: 0 Wound Status Wound Number: 2 Primary Etiology: Diabetic Wound/Ulcer of the Lower Extremity Wound Location: Left, Lateral Lower Leg Wound Status: Open Wounding Event: Gradually Appeared Comorbid History: Congestive Heart Failure, Hypertension, Type II Diabetes Date Acquired: 08/09/2019 Weeks Of Treatment: 0 Clustered Wound: No Photos Photo Uploaded By: Mikeal Hawthorne on 10/25/2019 12:53:48 Wound Measurements Length: (cm) 1 Width: (cm) 0.4 Depth: (cm) 0.1 Area: (cm) 0.314 Volume: (cm) 0.031 % Reduction in Area: % Reduction in Volume: Epithelialization: None Wound Description Classification: Grade 2 Exudate Amount: Medium Exudate Type: Serosanguineous Exudate Color: red, Kerins Foul Odor After Cleansing: No Slough/Fibrino Yes Wound  Bed Granulation Amount: Medium (34-66%) Exposed Structure Granulation Quality: Pink Fascia Exposed: No Necrotic Amount: Medium (34-66%) Fat Layer (Subcutaneous Tissue) Exposed: Yes Necrotic Quality: Adherent Slough Tendon Exposed: No Muscle Exposed: No Joint Exposed: No Bone Exposed: No Treatment Notes Wound #2 (Left, Lateral Lower Leg) 2. Periwound Care Moisturizing lotion 3. Primary Dressing Applied Calcium Alginate Ag 4. Secondary Dressing Dry Gauze 6. Support Layer Applied 4 layer compression wrap Electronic Signature(s) Signed: 10/25/2019 6:04:59 PM By: Carlene Coria RN Entered By: Carlene Coria on 10/23/2019 16:19:33 -------------------------------------------------------------------------------- Wound Assessment Details Patient Name: Date of Service: Jeffery Porter [name]. 10/23/2019 2:45 PM Medical Record Number: [id number] Patient Account Number: [id number] Date of Birth/Sex: Treating RN:  [birth year] ([name]) Epps, Morey Hummingbird Primary Care Julanne Schlueter: [name] Other Clinician: Referring Jerzi Tigert: Treating Gaynelle Pastrana/Extender: [name] Weeks in Treatment: 0 Wound Status Wound Number: 3 Primary Etiology: Diabetic Wound/Ulcer of the Lower Extremity Wound Location: Left, Medial Lower Leg Wound Status: Open Wounding Event: Gradually Appeared Comorbid History: Congestive Heart Failure, Hypertension, Type II Diabetes Date Acquired: 08/09/2019 Weeks Of Treatment: 0 Clustered Wound: No Photos Photo Uploaded By: Mikeal Hawthorne on 10/25/2019 12:54:22 Wound Measurements Length: (cm) 1 Width: (cm) 0.8 Depth: (cm) 0.1 Area: (cm) 0.628 Volume: (cm) 0.063 % Reduction in Area: % Reduction in Volume: Epithelialization: None Tunneling: No Undermining: No Wound Description Classification: Grade 2 Exudate Amount: Medium Exudate Type: Serosanguineous Exudate Color: red, Hoheisel Foul Odor After Cleansing: No Slough/Fibrino Yes Wound  Bed Granulation Amount: Medium (34-66%) Exposed Structure Granulation Quality: Red, Pink Fascia Exposed: No Necrotic Amount: Medium (34-66%) Fat Layer (Subcutaneous Tissue) Exposed: Yes Necrotic Quality: Adherent Slough Tendon Exposed: No Muscle Exposed: No Joint Exposed: No Bone Exposed: No Treatment Notes Wound #3 (Left, Medial Lower Leg) 2. Periwound Care Moisturizing lotion 3. Primary Dressing Applied Calcium Alginate Ag 4. Secondary Dressing Dry Gauze 6. Support Layer Applied 4 layer compression wrap Electronic Signature(s) Signed: 10/25/2019 6:04:59 PM By: Carlene Coria RN Entered By: Carlene Coria on 10/23/2019 16:20:35 -------------------------------------------------------------------------------- Wound Assessment Details Patient Name: Date of Service: Jeffery Porter [name]. 10/23/2019 2:45 PM Medical Record Number: [id number] Patient Account Number: [id number] Date of Birth/Sex: Treating RN: [birth year] (58 y.o. Male) Carlene Coria Primary Care Tyus Kallam: [name] Other Clinician: Referring Breeanne Oblinger: Treating Kenise Barraco/Extender: [name] Weeks in Treatment: 0 Wound Status Wound Number: 4 Primary Etiology: Diabetic Wound/Ulcer of the Lower Extremity Wound Location: Right [name] Second oe Wound Status: Open Wounding Event: Gradually Appeared Comorbid History: Congestive Heart Failure, Hypertension, Type II Diabetes Date Acquired: 09/28/2019 Weeks Of Treatment: 0 Clustered Wound: No Photos Photo Uploaded By: Mikeal Hawthorne on 10/25/2019 12:54:23 Wound Measurements Length: (cm) 1.2 % R Width: (cm) 2 % R Depth: (cm) 0.1 Epi Area: (cm) 1.885 Tu Volume: (cm) 0.188 Un eduction in Area: eduction in Volume: thelialization: None nneling: No dermining: No Wound Description Classification: Grade 2 Fou Exudate Amount: Medium Slo Exudate Type: Serosanguineous Exudate Color: red, Coreas l Odor After Cleansing: No ugh/Fibrino No Wound  Bed Granulation Amount: Large (67-100%) Exposed Structure Granulation Quality: Red Fascia Exposed: No Fat Layer (Subcutaneous Tissue) Exposed: Yes Tendon Exposed: No Muscle Exposed: No Joint Exposed: No Bone Exposed: No Treatment Notes Wound #4 (Right Toe Second) 2. Periwound Care Moisturizing lotion 3. Primary Dressing Applied Calcium Alginate Ag 4. Secondary Dressing Dry Gauze 6. Support Layer Applied 4 layer compression wrap Electronic Signature(s) Signed: 10/25/2019 6:04:59 PM By: Carlene Coria RN Entered By: Carlene Coria on 10/23/2019 16:21:51 -------------------------------------------------------------------------------- Wound Assessment Details Patient Name: Date of Service: Jeffery Porter [name]. 10/23/2019 2:45 PM Medical Record Number: [id number] Patient Account Number: [id number] Date of Birth/Sex: Treating RN: May 15, 1960 (58 y.o. Male) Carlene Coria Primary Care Corbett Moulder: [name] Other Clinician: Referring Caniya Tagle: Treating Tyreanna Bisesi/Extender: [name] Weeks in Treatment: 0 Wound Status Wound Number: 5 Primary Etiology: Diabetic Wound/Ulcer of the Lower Extremity Wound Location: Right, Medial Lower Leg Wound Status: Open Wounding Event: Gradually Appeared Comorbid History: Congestive Heart Failure, Hypertension, Type II Diabetes Date Acquired: 08/09/2019 Weeks Of Treatment: 0 Clustered Wound: No Photos Photo Uploaded By: Mikeal Hawthorne on 10/25/2019 12:54:55 Wound Measurements Length: (cm) 2.1 Width: (cm) 1.5 Depth: (cm) 0.1 Area: (cm) 2.474 Volume: (cm)  0.247 % Reduction in Area: % Reduction in Volume: Epithelialization: None Tunneling: No Undermining: No Wound Description Classification: Grade 2 Exudate Amount: Medium Exudate Type: Serosanguineous Exudate Color: red, Hoeffner Foul Odor After Cleansing: No Slough/Fibrino Yes Wound Bed Granulation Amount: Large (67-100%) Exposed Structure Granulation Quality:  Red Fascia Exposed: No Necrotic Amount: Small (1-33%) Fat Layer (Subcutaneous Tissue) Exposed: Yes Necrotic Quality: Adherent Slough Tendon Exposed: No Muscle Exposed: No Joint Exposed: No Bone Exposed: No Treatment Notes Wound #5 (Right, Medial Lower Leg) 2. Periwound Care Moisturizing lotion 3. Primary Dressing Applied Calcium Alginate Ag 4. Secondary Dressing Dry Gauze 6. Support Layer Applied 4 layer compression Water quality scientist) Signed: 10/25/2019 6:04:59 PM By: Carlene Coria RN Entered By: Carlene Coria on 10/23/2019 16:22:55 -------------------------------------------------------------------------------- Wound Assessment Details Patient Name: Date of Service: Jeffery Porter [name]. 10/23/2019 2:45 PM Medical Record Number: [id number] Patient Account Number: [id number] Date of Birth/Sex: Treating RN: November 25, 1960 (58 y.o. Male) Carlene Coria Primary Care Pinky Ravan: [name] Other Clinician: Referring Theseus Birnie: Treating Kaikoa Magro/Extender: [name] Weeks in Treatment: 0 Wound Status Wound Number: 6 Primary Etiology: Diabetic Wound/Ulcer of the Lower Extremity Wound Location: Right, Lateral Lower Leg Wound Status: Open Wounding Event: Gradually Appeared Comorbid History: Congestive Heart Failure, Hypertension, Type II Diabetes Date Acquired: 08/09/2019 Weeks Of Treatment: 0 Clustered Wound: Yes Photos Photo Uploaded By: Mikeal Hawthorne on 10/25/2019 12:54:55 Wound Measurements Length: (cm) 10 Width: (cm) 1 Depth: (cm) 0.1 Area: (cm) 7.854 Volume: (cm) 0.785 % Reduction in Area: 0% % Reduction in Volume: 0% Epithelialization: None Tunneling: No Undermining: No Wound Description Classification: Grade 2 Exudate Amount: Medium Exudate Type: Serosanguineous Exudate Color: red, Carbary Foul Odor After Cleansing: No Slough/Fibrino Yes Wound Bed Granulation Amount: Large (67-100%) Exposed Structure Granulation Quality:  Red Fascia Exposed: No Necrotic Amount: Small (1-33%) Fat Layer (Subcutaneous Tissue) Exposed: Yes Necrotic Quality: Adherent Slough Tendon Exposed: No Muscle Exposed: No Joint Exposed: No Bone Exposed: No Treatment Notes Wound #6 (Right, Lateral Lower Leg) 2. Periwound Care Moisturizing lotion 3. Primary Dressing Applied Calcium Alginate Ag 4. Secondary Dressing Dry Gauze 6. Support Layer Applied 4 layer compression wrap Electronic Signature(s) Signed: 10/25/2019 6:04:59 PM By: Carlene Coria RN Entered By: Carlene Coria on 10/23/2019 16:24:58 -------------------------------------------------------------------------------- Wound Assessment Details Patient Name: Date of Service: Jeffery Porter [name]. 10/23/2019 2:45 PM Medical Record Number: [id number] Patient Account Number: [id number] Date of Birth/Sex: Treating RN: 1960/07/01 (58 y.o. Male) Carlene Coria Primary Care Lucerito Rosinski: [name] Other Clinician: Referring Londell Noll: Treating Rickell Wiehe/Extender: [name] Weeks in Treatment: 0 Wound Status Wound Number: 7 Primary Etiology: Diabetic Wound/Ulcer of the Lower Extremity Wound Location: Right, Lateral Foot Wound Status: Open Wounding Event: Gradually Appeared Comorbid History: Congestive Heart Failure, Hypertension, Type II Diabetes Date Acquired: 08/02/2019 Weeks Of Treatment: 0 Clustered Wound: No Photos Photo Uploaded By: Mikeal Hawthorne on 10/25/2019 12:55:33 Wound Measurements Length: (cm) 4.5 Width: (cm) 3 Depth: (cm) 0.7 Area: (cm) 10.603 Volume: (cm) 7.422 % Reduction in Area: % Reduction in Volume: Epithelialization: None Tunneling: No Undermining: No Wound Description Classification: Grade 2 Exudate Amount: Medium Exudate Type: Serosanguineous Exudate Color: red, Gilberg Foul Odor After Cleansing: No Slough/Fibrino Yes Wound Bed Granulation Amount: Small (1-33%) Exposed Structure Granulation Quality: Red,  Pink Fascia Exposed: No Necrotic Amount: Large (67-100%) Fat Layer (Subcutaneous Tissue) Exposed: Yes Necrotic Quality: Adherent Slough Tendon Exposed: No Muscle Exposed: No Joint Exposed: No Bone Exposed: No Treatment Notes Wound #7 (Right, Lateral Foot) 2. Periwound Care Moisturizing lotion 3. Primary  Dressing Applied Calcium Alginate Ag 4. Secondary Dressing Dry Gauze 6. Support Layer Applied 4 layer compression wrap Electronic Signature(s) Signed: 10/25/2019 6:04:59 PM By: Carlene Coria RN Entered By: Carlene Coria on 10/23/2019 16:26:35 -------------------------------------------------------------------------------- Wound Assessment Details Patient Name: Date of Service: Jeffery Porter [name]. 10/23/2019 2:45 PM Medical Record Number: [id number] Patient Account Number: [id number] Date of Birth/Sex: Treating RN: 13-Feb-1961 (58 y.o. Male) Carlene Coria Primary Care Jermya Dowding: [name] Other Clinician: Referring Davida Falconi: Treating Antwine Agosto/Extender: [name] Weeks in Treatment: 0 Wound Status Wound Number: 8 Primary Etiology: Diabetic Wound/Ulcer of the Lower Extremity Wound Location: Right, Proximal Foot Wound Status: Open Wounding Event: Gradually Appeared Comorbid History: Congestive Heart Failure, Hypertension, Type II Diabetes Date Acquired: 08/02/2019 Weeks Of Treatment: 0 Clustered Wound: No Photos Photo Uploaded By: Mikeal Hawthorne on 10/25/2019 12:55:34 Wound Measurements Length: (cm) 0.4 Width: (cm) 0.4 Depth: (cm) 0.4 Area: (cm) 0.126 Volume: (cm) 0.05 % Reduction in Area: % Reduction in Volume: Epithelialization: None Tunneling: No Undermining: No Wound Description Classification: Grade 2 Exudate Amount: Medium Exudate Type: Serosanguineous Exudate Color: red, Warth Foul Odor After Cleansing: No Slough/Fibrino Yes Wound Bed Granulation Amount: Medium (34-66%) Exposed Structure Granulation Quality: Pink Fascia  Exposed: No Necrotic Amount: Medium (34-66%) Fat Layer (Subcutaneous Tissue) Exposed: No Necrotic Quality: Adherent Slough Tendon Exposed: No Muscle Exposed: No Joint Exposed: No Bone Exposed: No Electronic Signature(s) Signed: 10/25/2019 6:04:59 PM By: Carlene Coria RN Entered By: Carlene Coria on 10/23/2019 16:32:10 -------------------------------------------------------------------------------- Wound Assessment Details Patient Name: Date of Service: Jeffery Porter [name]. 10/23/2019 2:45 PM Medical Record Number: [id number] Patient Account Number: [id number] Date of Birth/Sex: Treating RN: Nov 20, 1960 (58 y.o. Male) Carlene Coria Primary Care Lydian Chavous: [name] Other Clinician: Referring Vernel Langenderfer: Treating Darrow Barreiro/Extender: Kandice Robinsons, Charlane Ferretti Weeks in Treatment: 0 Wound Status Wound Number: 9 Primary Etiology: Skin Tear Wound Location: Left Forearm Wound Status: Open Wounding Event: Trauma Comorbid History: Congestive Heart Failure, Hypertension, Type II Diabetes Date Acquired: 10/20/2019 Weeks Of Treatment: 0 Clustered Wound: No Photos Photo Uploaded By: Mikeal Hawthorne on 10/25/2019 12:56:09 Wound Measurements Length: (cm) 6 Width: (cm) 3.5 Depth: (cm) 0.1 Area: (cm) 16.493 Volume: (cm) 1.649 % Reduction in Area: % Reduction in Volume: Epithelialization: None Tunneling: No Undermining: No Wound Description Classification: Full Thickness Without Exposed Support Structures Exudate Amount: Medium Exudate Type: Serosanguineous Exudate Color: red, Codner Foul Odor After Cleansing: No Slough/Fibrino Yes Wound Bed Granulation Amount: Large (67-100%) Exposed Structure Granulation Quality: Red Fascia Exposed: No Necrotic Amount: Small (1-33%) Fat Layer (Subcutaneous Tissue) Exposed: Yes Necrotic Quality: Adherent Slough Tendon Exposed: No Muscle Exposed: No Joint Exposed: No Bone Exposed: No Treatment Notes Wound #9 (Left Forearm) 3.  Primary Dressing Applied Xeroform Gauze 4. Secondary Dressing Foam Border Dressing Electronic Signature(s) Signed: 10/25/2019 6:04:59 PM By: Carlene Coria RN Entered By: Carlene Coria on 10/23/2019 16:33:22 -------------------------------------------------------------------------------- [place] Details Patient Name: Date of Service: Jeffery Porter [name]. 10/23/2019 2:45 PM Medical Record Number: [id number] Patient Account Number: [id number] Date of Birth/Sex: Treating RN: [birth year] (58 y.o. Male) Carlene Coria Primary Care Jamerion Cabello: [name] Other Clinician: Referring Siraj Dermody: Treating Macio Kissoon/Extender: [name] Weeks in Treatment: 0 Vital Signs Time Taken: 15:32 Temperature (F): 98.2 Height (in): 74 Pulse (bpm): 101 Source: Stated Respiratory Rate (breaths/min): 20 Weight (lbs): 345 Blood Pressure (mmHg): 151/88 Source: Stated Reference Range: 80 - 120 mg / dl Body Mass Index (BMI): 44.3 Electronic Signature(s) Signed: 10/25/2019 6:04:59 PM By: Carlene Coria RN Entered By: Carlene Coria on  10/23/2019 15:40:44 

## 2019-10-26 DIAGNOSIS — N390 Urinary tract infection, site not specified: Secondary | ICD-10-CM | POA: Diagnosis not present

## 2019-10-26 DIAGNOSIS — N189 Chronic kidney disease, unspecified: Secondary | ICD-10-CM | POA: Diagnosis not present

## 2019-10-26 DIAGNOSIS — N1831 Chronic kidney disease, stage 3a: Secondary | ICD-10-CM | POA: Diagnosis not present

## 2019-11-02 ENCOUNTER — Other Ambulatory Visit: Payer: Self-pay | Admitting: Family Medicine

## 2019-11-02 DIAGNOSIS — E1165 Type 2 diabetes mellitus with hyperglycemia: Secondary | ICD-10-CM

## 2019-11-02 DIAGNOSIS — G8929 Other chronic pain: Secondary | ICD-10-CM

## 2019-11-02 DIAGNOSIS — IMO0002 Reserved for concepts with insufficient information to code with codable children: Secondary | ICD-10-CM

## 2019-11-02 DIAGNOSIS — M5442 Lumbago with sciatica, left side: Secondary | ICD-10-CM

## 2019-11-03 ENCOUNTER — Encounter (HOSPITAL_BASED_OUTPATIENT_CLINIC_OR_DEPARTMENT_OTHER): Payer: Medicaid Other | Attending: Physician Assistant | Admitting: Physician Assistant

## 2019-11-03 ENCOUNTER — Other Ambulatory Visit: Payer: Self-pay

## 2019-11-03 DIAGNOSIS — I509 Heart failure, unspecified: Secondary | ICD-10-CM | POA: Diagnosis not present

## 2019-11-03 DIAGNOSIS — I130 Hypertensive heart and chronic kidney disease with heart failure and stage 1 through stage 4 chronic kidney disease, or unspecified chronic kidney disease: Secondary | ICD-10-CM | POA: Insufficient documentation

## 2019-11-03 DIAGNOSIS — L97811 Non-pressure chronic ulcer of other part of right lower leg limited to breakdown of skin: Secondary | ICD-10-CM | POA: Diagnosis not present

## 2019-11-03 DIAGNOSIS — E11621 Type 2 diabetes mellitus with foot ulcer: Secondary | ICD-10-CM | POA: Diagnosis not present

## 2019-11-03 DIAGNOSIS — L97512 Non-pressure chronic ulcer of other part of right foot with fat layer exposed: Secondary | ICD-10-CM | POA: Insufficient documentation

## 2019-11-03 DIAGNOSIS — N189 Chronic kidney disease, unspecified: Secondary | ICD-10-CM | POA: Diagnosis not present

## 2019-11-03 DIAGNOSIS — L97821 Non-pressure chronic ulcer of other part of left lower leg limited to breakdown of skin: Secondary | ICD-10-CM | POA: Diagnosis not present

## 2019-11-03 DIAGNOSIS — E1142 Type 2 diabetes mellitus with diabetic polyneuropathy: Secondary | ICD-10-CM | POA: Insufficient documentation

## 2019-11-03 DIAGNOSIS — E1122 Type 2 diabetes mellitus with diabetic chronic kidney disease: Secondary | ICD-10-CM | POA: Diagnosis not present

## 2019-11-03 NOTE — Progress Notes (Addendum)
Jeffery [name] ([id number]) . Visit Report for 11/03/2019 Chief Complaint Document Details Patient Name: Date of Service: Jeffery [name]. 11/03/2019 3:15 PM Medical Record Number: [id number] Patient Account Number: [id number] Date of Birth/Sex: Treating RN: February 22, 1961 (60 y.o. Jeffery [name] Primary Care Provider: Charlott [name] Other Clinician: Referring Provider: Treating Provider/Extender: Jeffery [name], Jeffery [name] in Treatment: 1 Information Obtained from: Patient Chief Complaint 7/26; patient is here for review of wounds on his bilateral lower extremities and left forearm of different etiologies. Electronic Signature(s) Signed: 11/03/2019 3:33:54 PM By: Worthy Keeler PA-C Entered By: Worthy Keeler on 11/03/2019 15:33:54 -------------------------------------------------------------------------------- Debridement Details Patient Name: Date of Service: Jeffery [name]. 11/03/2019 3:15 PM Medical Record Number: [id number] Patient Account Number: [id number] Date of Birth/Sex: Treating RN: 22-Nov-1960 (59 y.o. Jeffery [name] Primary Care Provider: Charlott [name] Other Clinician: Referring Provider: Treating Provider/Extender: Jeffery [name], Jeffery [name] in Treatment: 1 Debridement Performed for Assessment: Wound #7 Right,Lateral Foot Performed By: Physician Worthy Keeler, PA Debridement Type: Debridement Severity of Tissue Pre Debridement: Fat layer exposed Level of Consciousness (Pre-procedure): Awake and Alert Pre-procedure Verification/Time Out Yes - 16:35 Taken: Start Time: 16:35 Pain Control: Other : benzocaine, 20% [name] Area Debrided (L x W): otal 4.5 (cm) x 2.7 (cm) = 12.15 (cm) Tissue and other material debrided: Viable, Non-Viable, Slough, Subcutaneous, Slough Level: Skin/Subcutaneous Tissue Debridement Description: Excisional Instrument: Curette Bleeding: Minimum Hemostasis Achieved: Pressure End Time:  16:36 Procedural Pain: 0 Post Procedural Pain: 0 Response to Treatment: Procedure was tolerated well Level of Consciousness (Post- Awake and Alert procedure): Post Debridement Measurements of Total Wound Length: (cm) 4.5 Width: (cm) 2.7 Depth: (cm) 0.3 Volume: (cm) 2.863 Character of Wound/Ulcer Post Debridement: Improved Severity of Tissue Post Debridement: Fat layer exposed Post Procedure Diagnosis Same as Pre-procedure Electronic Signature(s) Signed: 11/03/2019 4:58:13 PM By: Kela Millin Signed: 11/06/2019 2:22:24 PM By: Worthy Keeler PA-C Entered By: Kela Millin on 11/03/2019 16:38:31 -------------------------------------------------------------------------------- HPI Details Patient Name: Date of Service: Jeffery [name]. 11/03/2019 3:15 PM Medical Record Number: [id number] Patient Account Number: [id number] Date of Birth/Sex: Treating RN: 1960/05/14 (59 y.o. Jeffery [name] Primary Care Provider: Charlott [name] Other Clinician: Referring Provider: Treating Provider/Extender: Jeffery [name], Jeffery [name] in Treatment: 1 History of Present Illness HPI Description: ADMISSION 10/23/2019 This is a complex 59 year old man who is here with wounds on his bilateral lower legs feet and abrasion on his left forearm. These are generally of different etiology. He is a type II diabetic with peripheral neuropathy but does not have known PAD. Looking through [facility] health link I can see he was being followed by Dr. Earleen Newport earlier this year for a diabetic ulcer on the right foot. An MRI done in May showed no osteomyelitis. Shortly thereafter he developed cellulitis of the right leg and foot and was hospitalized from 5/10 through 5/19 with MSSA sepsis. During this hospitalization he was felt to have osteomyelitis he had debridement twice of the right foot and on one occasion apparently a placement of ACell. He again was hospitalized from 6/10 through 7/1 again with  left lower extremity cellulitis increasing edema and acute renal failure. An MRI showed left leg cellulitis but no other major findings. 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 He was discharged with multiple wounds on his legs which apparently started as blisters secondary to fluid overload. His creatinine on 7/5 was 7.49 potassium 4 BUN 116  CO2 of 23 albumin of 2.9. He was seen by Dr. Moshe Cipro of nephrology medications were adjusted she is following him. 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 The patient arrives in clinic today with multiple wounds of different etiologies on his bilateral lower extremities. On the right leg he has superficial areas on the right medial and lateral.. On his right lateral foot I think the original surgical wound. There is a small area at the base of his left second toe. On the left lower extremity a fairly large wound on the left second toe which was trauma from the shower door. He has several areas on the legs. Which are superficial. Finally he has an abrasion injury on his left arm/skin tear Past medical history includes Charcot foot. Left third toe amputation, type 2 diabetes with peripheral neuropathy, paroxysmal A. fib on Eliquis, diastolic heart failure, acute on chronic renal failure as described. ABI in our clinic was 1.18 on the right 1.19 on the left 11/03/2019 upon evaluation today patient appears to be doing well with regard to his ulcers in general. He is making good progress. The worst is his larger right lateral foot ulcer. This wound is going require some sharp debridement today. Other than that he seems to be doing quite well. Electronic Signature(s) Signed: 11/03/2019 4:59:56 PM By: Worthy Keeler PA-C Entered By: Worthy Keeler on 11/03/2019 16:59:55 -------------------------------------------------------------------------------- Physical Exam  Details Patient Name: Date of Service: Jeffery [name]. 11/03/2019 3:15 PM Medical Record Number: [id number] Patient Account Number: [id number] Date of Birth/Sex: Treating RN: 04-30-1960 (59 y.o. Jeffery [name] Primary Care Provider: Charlott [name] Other Clinician: Referring Provider: Treating Provider/Extender: Jeffery [name], Jeffery [name] in Treatment: 1 Constitutional Obese and well-hydrated in no acute distress. Respiratory normal breathing without difficulty. Psychiatric this patient is able to make decisions and demonstrates good insight into disease process. Alert and Oriented x 3. pleasant and cooperative. Notes Patient's wound bed currently showed signs of good granulation at this time there does not appear to be any evidence of active infection and overall I am pleased in general. I did perform sharp debridement in regard to the right lateral foot he tolerated that today without complication post debridement wound bed appears to be doing much better. Electronic Signature(s) Signed: 11/03/2019 5:00:14 PM By: Worthy Keeler PA-C Entered By: Worthy Keeler on 11/03/2019 17:00:14 -------------------------------------------------------------------------------- Physician Orders Details Patient Name: Date of Service: Jeffery [name]. 11/03/2019 3:15 PM Medical Record Number: [id number] Patient Account Number: [id number] Date of Birth/Sex: Treating RN: Dec 25, 1960 (59 y.o. Jeffery [name] Primary Care Provider: Charlott [name] Other Clinician: Referring Provider: Treating Provider/Extender: Jeffery [name], Jeffery [name] in Treatment: 1 Verbal / Phone Orders: No Diagnosis Coding ICD-10 Coding Code Description E11.621 Type 2 diabetes mellitus with foot ulcer T81.31XA Disruption of external operation (surgical) wound, not elsewhere classified, initial encounter L97.811 Non-pressure chronic ulcer of other part of right lower leg limited to  breakdown of skin L97.821 Non-pressure chronic ulcer of other part of left lower leg limited to breakdown of skin S90.812D Abrasion, left foot, subsequent encounter S50.812D Abrasion of left forearm, subsequent encounter E11.22 Type 2 diabetes mellitus with diabetic chronic kidney disease Follow-up Appointments ppointment in 1 week. - Friday - ****EXTRA TIME - 36 MINUTES**** Return A Dressing Change Frequency Change dressing three times week. - all wounds (wound clinic to change on Friday, home health to change on  Monday and Wednesday) Skin Barriers/Peri-Wound Care Moisturizing lotion - both legs Wound Cleansing Clean wound with Normal Saline. - or wound cleanser Primary Wound Dressing Wound #1 Left,Proximal,Anterior Lower Leg Calcium Alginate with Silver Wound #10 Left [name] Second oe Calcium Alginate with Silver Wound #2 Left,Lateral Lower Leg Calcium Alginate with Silver Wound #5 Right,Medial Lower Leg Calcium Alginate with Silver Wound #6 Right,Lateral Lower Leg Calcium Alginate with Silver Wound #7 Right,Lateral Foot Hydrofera Blue Wound #8 Right,Proximal,Lateral Foot Calcium Alginate with Silver Secondary Dressing Dry Gauze - all wounds ABD pad - all wounds on legs Wound #10 Left [name] Second oe Kerlix/Rolled Gauze Dry Gauze Edema Control 4 layer compression - Bilateral - unna boot to top to anchor Avoid standing for long periods of time Elevate legs to the level of the heart or above for 30 minutes daily and/or when sitting, a frequency of: - throughout the day Exercise regularly [facility] for wound care. - [facility] Electronic Signature(s) Signed: 11/03/2019 4:58:13 PM By: Kela Millin Signed: 11/06/2019 2:22:24 PM By: Worthy Keeler PA-C Entered By: Kela Millin on 11/03/2019 16:41:42 -------------------------------------------------------------------------------- Problem List Details Patient Name: Date of Service: Jeffery [name]. 11/03/2019 3:15 PM Medical Record Number: [id number] Patient Account Number: [id number] Date of Birth/Sex: Treating RN: 01-Jun-1960 (59 y.o. Jeffery [name] Primary Care Provider: Charlott [name] Other Clinician: Referring Provider: Treating Provider/Extender: Perlie Mayo [name] in Treatment: 1 Active Problems ICD-10 Encounter Code Description Active Date MDM Diagnosis E11.621 Type 2 diabetes mellitus with foot ulcer 10/23/2019 No Yes T81.31XA Disruption of external operation (surgical) wound, not elsewhere classified, 10/23/2019 No Yes initial encounter L97.811 Non-pressure chronic ulcer of other part of right lower leg limited to breakdown 10/23/2019 No Yes of skin L97.821 Non-pressure chronic ulcer of other part of left lower leg limited to breakdown 10/23/2019 No Yes of skin S90.812D Abrasion, left foot, subsequent encounter 10/23/2019 No Yes S50.812D Abrasion of left forearm, subsequent encounter 10/23/2019 No Yes E11.22 Type 2 diabetes mellitus with diabetic chronic kidney disease 10/23/2019 No Yes L97.512 Non-pressure chronic ulcer of other part of right foot with fat layer exposed 11/03/2019 No Yes Inactive Problems Resolved Problems Electronic Signature(s) Signed: 11/03/2019 5:03:27 PM By: Worthy Keeler PA-C Previous Signature: 11/03/2019 4:58:13 PM Version By: Kela Millin Previous Signature: 11/03/2019 3:33:46 PM Version By: Worthy Keeler PA-C Entered By: Worthy Keeler on 11/03/2019 17:03:27 -------------------------------------------------------------------------------- Progress Note Details Patient Name: Date of Service: Jeffery [name]. 11/03/2019 3:15 PM Medical Record Number: [id number] Patient Account Number: [id number] Date of Birth/Sex: Treating RN: 09-21-1960 (59 y.o. Jeffery [name] Primary Care Provider: Charlott [name] Other Clinician: Referring Provider: Treating Provider/Extender: Jeffery [name],  Jeffery [name] in Treatment: 1 Subjective Chief Complaint Information obtained from Patient 7/26; patient is here for review of wounds on his bilateral lower extremities and left forearm of different etiologies. History of Present Illness (HPI) ADMISSION 10/23/2019 This is a complex 59 year old man who is here with wounds on his bilateral lower legs feet and abrasion on his left forearm. These are generally of different etiology. He is a type II diabetic with peripheral neuropathy but does not have known PAD. Looking through [facility] health link I can see he was being followed by Dr. Earleen Newport earlier this year for a diabetic ulcer on the right foot. An MRI done in May showed no osteomyelitis. Shortly thereafter he developed cellulitis of the right leg and foot and was hospitalized from  5/10 through 5/19 with MSSA sepsis. During this hospitalization he was felt to have osteomyelitis he had debridement twice of the right foot and on one occasion apparently a placement of ACell. He again was hospitalized from 6/10 through 7/1 again with left lower extremity cellulitis increasing edema and acute renal failure. An MRI showed left leg cellulitis but no other major findings. 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 He was discharged with multiple wounds on his legs which apparently started as blisters secondary to fluid overload. His creatinine on 7/5 was 7.49 potassium 4 BUN 116 CO2 of 23 albumin of 2.9. He was seen by Dr. Moshe Cipro of nephrology medications were adjusted she is following him. 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 The patient arrives in clinic today with multiple wounds of different etiologies on his bilateral lower extremities. ooOn the right leg he has superficial areas on the right medial and lateral.. On his right lateral foot I think the original surgical wound. There is a small area at  the base of his left second toe. ooOn the left lower extremity a fairly large wound on the left second toe which was trauma from the shower door. He has several areas on the legs. Which are superficial. ooFinally he has an abrasion injury on his left arm/skin tear Past medical history includes Charcot foot. Left third toe amputation, type 2 diabetes with peripheral neuropathy, paroxysmal A. fib on Eliquis, diastolic heart failure, acute on chronic renal failure as described. ABI in our clinic was 1.18 on the right 1.19 on the left 11/03/2019 upon evaluation today patient appears to be doing well with regard to his ulcers in general. He is making good progress. The worst is his larger right lateral foot ulcer. This wound is going require some sharp debridement today. Other than that he seems to be doing quite well. Objective Constitutional Obese and well-hydrated in no acute distress. Vitals Time Taken: 3:44 PM, Height: 74 in, Weight: 345 lbs, BMI: 44.3, [name] emperature: 98.0 F, Pulse: 85 bpm, Respiratory Rate: 20 breaths/min, Blood Pressure: 156/83 mmHg. General Notes: Patient states he rolled off his bed last night while sleep and hurt his rib he is in a lot of pain. Respiratory normal breathing without difficulty. Psychiatric this patient is able to make decisions and demonstrates good insight into disease process. Alert and Oriented x 3. pleasant and cooperative. General Notes: Patient's wound bed currently showed signs of good granulation at this time there does not appear to be any evidence of active infection and overall I am pleased in general. I did perform sharp debridement in regard to the right lateral foot he tolerated that today without complication post debridement wound bed appears to be doing much better. Integumentary (Hair, Skin) Wound #1 status is Open. Original cause of wound was Gradually Appeared. The wound is located on the Left,Proximal,Anterior Lower Leg. The  wound measures 2.5cm length x 1.8cm width x 0.1cm depth; 3.534cm^2 area and 0.353cm^3 volume. There is Fat Layer (Subcutaneous Tissue) Exposed exposed. There is no tunneling or undermining noted. There is a medium amount of serosanguineous drainage noted. There is large (67-100%) red granulation within the wound bed. There is a small (1-33%) amount of necrotic tissue within the wound bed including Adherent Slough. Wound #10 status is Open. Original cause of wound was Gradually Appeared. The wound is located on the Left [name] Second. The wound measures 0.1cm length oe  x 0.1cm width x 0.1cm depth; 0.008cm^2 area and 0.001cm^3 volume. There is no tunneling or undermining noted. There is a none present amount of drainage noted. The wound margin is flat and intact. There is no granulation within the wound bed. There is a large (67-100%) amount of necrotic tissue within the wound bed including Adherent Slough. Wound #2 status is Open. Original cause of wound was Gradually Appeared. The wound is located on the Left,Lateral Lower Leg. The wound measures 0.2cm length x 0.2cm width x 0.1cm depth; 0.031cm^2 area and 0.003cm^3 volume. There is Fat Layer (Subcutaneous Tissue) Exposed exposed. There is no tunneling or undermining noted. There is a small amount of serosanguineous drainage noted. There is large (67-100%) pink granulation within the wound bed. There is no necrotic tissue within the wound bed. Wound #3 status is Open. Original cause of wound was Gradually Appeared. The wound is located on the Left,Medial Lower Leg. The wound measures 0cm length x 0cm width x 0cm depth; 0cm^2 area and 0cm^3 volume. There is no tunneling or undermining noted. There is a none present amount of drainage noted. There is no granulation within the wound bed. There is no necrotic tissue within the wound bed. Wound #4 status is Open. Original cause of wound was Gradually Appeared. The wound is located on the Right [name] Second. The  wound measures 0cm length x oe 0cm width x 0cm depth; 0cm^2 area and 0cm^3 volume. There is no tunneling or undermining noted. There is a none present amount of drainage noted. There is no granulation within the wound bed. There is no necrotic tissue within the wound bed. Wound #5 status is Open. Original cause of wound was Gradually Appeared. The wound is located on the Right,Medial Lower Leg. The wound measures 0.2cm length x 0.2cm width x 0.1cm depth; 0.031cm^2 area and 0.003cm^3 volume. There is Fat Layer (Subcutaneous Tissue) Exposed exposed. There is no tunneling or undermining noted. There is a medium amount of serosanguineous drainage noted. There is large (67-100%) red granulation within the wound bed. There is a small (1-33%) amount of necrotic tissue within the wound bed including Adherent Slough. Wound #6 status is Open. Original cause of wound was Gradually Appeared. The wound is located on the Right,Lateral Lower Leg. The wound measures 0.1cm length x 0.1cm width x 0.1cm depth; 0.008cm^2 area and 0.001cm^3 volume. There is Fat Layer (Subcutaneous Tissue) Exposed exposed. There is no tunneling or undermining noted. There is a none present amount of drainage noted. There is large (67-100%) red granulation within the wound bed. There is no necrotic tissue within the wound bed. Wound #7 status is Open. Original cause of wound was Gradually Appeared. The wound is located on the Right,Lateral Foot. The wound measures 4.5cm length x 2.7cm width x 0.3cm depth; 9.543cm^2 area and 2.863cm^3 volume. There is Fat Layer (Subcutaneous Tissue) Exposed exposed. There is no tunneling or undermining noted. There is a medium amount of serosanguineous drainage noted. There is medium (34-66%) red, pink granulation within the wound bed. There is a medium (34-66%) amount of necrotic tissue within the wound bed including Adherent Slough. Wound #8 status is Open. Original cause of wound was Gradually Appeared.  The wound is located on the Right,Proximal,Lateral Foot. The wound measures 0.3cm length x 0.3cm width x 0.2cm depth; 0.071cm^2 area and 0.014cm^3 volume. There is no tunneling or undermining noted. There is a medium amount of serosanguineous drainage noted. There is large (67-100%) pink granulation within the wound bed. There is no  necrotic tissue within the wound bed. Wound #9 status is Open. Original cause of wound was Trauma. The wound is located on the Left Forearm. The wound measures 0cm length x 0cm width x 0cm depth; 0cm^2 area and 0cm^3 volume. There is no tunneling or undermining noted. There is a none present amount of drainage noted. There is no granulation within the wound bed. There is no necrotic tissue within the wound bed. 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 There was a Excisional Skin/Subcutaneous Tissue Debridement with a total area of 12.15 sq cm performed by Worthy Keeler, PA. With the following instrument(s): Curette to remove Viable and Non-Viable tissue/material. Material removed includes Subcutaneous Tissue and Slough and after achieving pain control using Other (benzocaine, 20%). No specimens were taken. A time out was conducted at 16:35,  prior to the start of the procedure. A Minimum amount of bleeding was controlled with Pressure. The procedure was tolerated well with a pain level of 0 throughout and a pain level of 0 following the procedure. Post Debridement Measurements: 4.5cm length x 2.7cm width x 0.3cm depth; 2.863cm^3 volume. Character of Wound/Ulcer Post Debridement is improved. Severity of Tissue Post Debridement is: Fat layer exposed. Post procedure Diagnosis Wound #7: Same as Pre-Procedure Pre-procedure diagnosis of Wound #7 is a Diabetic Wound/Ulcer of the Lower Extremity located on the Right,Lateral Foot . There was a Four Layer Compression Therapy Procedure by Kela Millin, RN. Post procedure Diagnosis Wound #7: Same as Pre-Procedure Wound #1 Pre-procedure diagnosis of Wound #1 is a Diabetic Wound/Ulcer of the Lower Extremity located on the Left,Proximal,Anterior Lower Leg . There was a Four Layer Compression Therapy Procedure by Kela Millin, RN. Post procedure Diagnosis Wound #1: Same as Pre-Procedure Wound #2 Pre-procedure diagnosis of Wound #2 is a Diabetic Wound/Ulcer of the Lower Extremity located on the Left,Lateral Lower Leg . There was a Four Layer Compression Therapy Procedure by Kela Millin, RN. Post procedure Diagnosis Wound #2: Same as Pre-Procedure Wound #5 Pre-procedure diagnosis of Wound #5 is a Diabetic Wound/Ulcer of the Lower Extremity located on the Right,Medial Lower Leg . There was a Four Layer Compression Therapy Procedure by Kela Millin, RN. Post procedure Diagnosis Wound #5: Same as Pre-Procedure Wound #6 Pre-procedure diagnosis of Wound #6 is a Diabetic Wound/Ulcer of the Lower Extremity located on the Right,Lateral Lower Leg . There was a Four Layer Compression Therapy Procedure by Kela Millin, RN. Post procedure Diagnosis Wound #6: Same as Pre-Procedure Wound #8 Pre-procedure diagnosis of Wound #8 is a Diabetic Wound/Ulcer of the Lower Extremity located on  the Right,Proximal,Lateral Foot . There was a Four Layer Compression Therapy Procedure by Kela Millin, RN. Post procedure Diagnosis Wound #8: Same as Pre-Procedure Plan Follow-up Appointments: Return Appointment in 1 week. - Friday - ****EXTRA TIME - 75 MINUTES**** Dressing Change Frequency: Change dressing three times week. - all wounds (wound  clinic to change on Friday, home health to change on Monday and Wednesday) Skin Barriers/Peri-Wound Care: Moisturizing lotion - both legs Wound Cleansing: Clean wound with Normal Saline. - or wound cleanser Primary Wound Dressing: Wound #1 Left,Proximal,Anterior Lower Leg: Calcium Alginate with Silver Wound #10 Left [name] Second: oe Calcium Alginate with Silver Wound #2 Left,Lateral Lower Leg: Calcium Alginate with Silver Wound #5 Right,Medial Lower Leg: Calcium Alginate with Silver Wound #6 Right,Lateral Lower Leg: Calcium Alginate with Silver Wound #7 Right,Lateral Foot: Hydrofera Blue Wound #8 Right,Proximal,Lateral Foot: Calcium Alginate with Silver Secondary Dressing: Dry Gauze - all wounds ABD pad - all wounds on legs Wound #10 Left [name] Second: oe Kerlix/Rolled Gauze Dry Gauze Edema Control: 4 layer compression - Bilateral - unna boot to top to anchor Avoid standing for long periods of time Elevate legs to the level of the heart or above for 30 minutes daily and/or when sitting, a frequency of: - throughout the day Exercise regularly Home Health: [facility] for wound care. - [facility] 1. I would recommend that we continue with the silver alginate dressing to most wounds today as before which I think are doing quite well. We will use Hydrofera Blue over the right lateral foot. 2. I am also going to recommend that we continue at this time with the 4 layer compression wrap. I do believe that skin to help him keep this under good control as far as edema is concerned and hopefully should continue to help  these wounds to heal appropriately and effectively. 3. Multiple minute suggest patient continue to elevate his legs is much as possible. We will see patient back for reevaluation in 1 week here in the clinic. If anything worsens or changes patient will contact our office for additional recommendations. Electronic Signature(s) Signed: 11/03/2019 5:03:46 PM By: Worthy Keeler PA-C Previous Signature: 11/03/2019 5:02:10 PM Version By: Worthy Keeler PA-C Entered By: Worthy Keeler on 11/03/2019 17:03:46 -------------------------------------------------------------------------------- SuperBill Details Patient Name: Date of Service: Jeffery [name]. 11/03/2019 Medical Record Number: [id number] Patient Account Number: [id number] Date of Birth/Sex: Treating RN: 07-23-1960 (59 y.o. 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 S90.812D Abrasion, left foot, subsequent encounter S50.812D Abrasion of left forearm, subsequent encounter 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 Facility Procedures CPT4 Code: [id number] Description: [place] - DEB SUBQ TISSUE 20 SQ CM/< ICD-10 Diagnosis Description L97.512 Non-pressure chronic ulcer of other part of right foot with fat layer exposed Modifier: Quantity: 1 Physician Procedures : CPT4 Code Description Modifier [id number]  [id number] - WC PHYS SUBQ TISS 20 SQ CM ICD-10 Diagnosis Description L97.512 Non-pressure chronic ulcer of other part of right foot with fat layer exposed Quantity: 1 Electronic Signature(s) Signed: 11/06/2019 2:22:24 PM By: Worthy Keeler PA-C Signed: 11/06/2019 5:16:06 PM By: Kela Millin Previous Signature: 11/03/2019 5:03:56 PM Version By: Worthy Keeler PA-C Entered By: Kela Millin on 11/03/2019 17:04:30

## 2019-11-07 ENCOUNTER — Encounter: Payer: Self-pay | Admitting: Orthopaedic Surgery

## 2019-11-07 ENCOUNTER — Ambulatory Visit (INDEPENDENT_AMBULATORY_CARE_PROVIDER_SITE_OTHER): Payer: Medicaid Other | Admitting: Orthopaedic Surgery

## 2019-11-07 DIAGNOSIS — G8929 Other chronic pain: Secondary | ICD-10-CM

## 2019-11-07 DIAGNOSIS — M25562 Pain in left knee: Secondary | ICD-10-CM | POA: Diagnosis not present

## 2019-11-07 MED ORDER — BUPIVACAINE HCL 0.25 % IJ SOLN
2.0000 mL | INTRAMUSCULAR | Status: AC | PRN
  Administered 2019-11-07: 2 mL via INTRA_ARTICULAR

## 2019-11-07 MED ORDER — LIDOCAINE HCL 1 % IJ SOLN
2.0000 mL | INTRAMUSCULAR | Status: AC | PRN
Start: 2019-11-07 — End: 2019-11-07
  Administered 2019-11-07: 2 mL

## 2019-11-07 MED ORDER — METHYLPREDNISOLONE ACETATE 40 MG/ML IJ SUSP
80.0000 mg | INTRAMUSCULAR | Status: AC | PRN
  Administered 2019-11-07: 80 mg via INTRA_ARTICULAR

## 2019-11-07 NOTE — Progress Notes (Signed)
KWEKU, STANKEY T ([id number]) . Visit Report for 11/03/2019 Arrival Information Details Patient Name: Date of Service: Jeffery Porter T. 11/03/2019 3:15 PM Medical Record Number: [id number] Patient Account Number: [id number] Date of Birth/Sex: Treating RN: April 23, 1960 (59 y.o. [name] Primary Care Dartha Rozzell: [name] Other Clinician: Referring Magdalen Cabana: Treating Chrisann Melaragno/Extender: Con Memos, Enobong Weeks in Treatment: 1 Visit Information History Since Last Visit Added or deleted any medications: No Patient Arrived: Walker Any new allergies or adverse reactions: No Arrival Time: 15:42 Had a fall or experienced change in Yes Accompanied By: self activities of daily living that may affect Transfer Assistance: None risk of falls: Patient Identification Verified: Yes Signs or symptoms of abuse/neglect since last visito No Secondary Verification Process Completed: Yes Hospitalized since last visit: No Patient Requires Transmission-Based Precautions: No Implantable device outside of the clinic excluding No Patient Has Alerts: No cellular tissue based products placed in the center since last visit: Has Dressing in Place as Prescribed: Yes Has Compression in Place as Prescribed: Yes Pain Present Now: Yes Electronic Signature(s) Signed: 11/07/2019 10:17:44 AM By: Sandre Kitty Entered By: Sandre Kitty on 11/03/2019 15:44:57 -------------------------------------------------------------------------------- Compression Therapy Details Patient Name: Date of Service: Jeffery Porter T. 11/03/2019 3:15 PM Medical Record Number: [id number] Patient Account Number: [id number] Date of Birth/Sex: Treating RN: [birth year] (59 y.o. [name] Primary Care Elvin Banker: [name] Other Clinician: Referring Demetrio Leighty: Treating Isbella Arline/Extender: Con Memos, Enobong Weeks in Treatment: 1 Compression Therapy Performed for Wound  Assessment: Wound #1 Left,Proximal,Anterior Lower Leg Performed By: Clinician Kela Millin, RN Compression Type: Four Layer Post Procedure Diagnosis Same as Pre-procedure Electronic Signature(s) Signed: 11/03/2019 4:58:13 PM By: Kela Millin Entered By: Kela Millin on 11/03/2019 16:36:37 -------------------------------------------------------------------------------- Compression Therapy Details Patient Name: Date of Service: Jeffery Porter T. 11/03/2019 3:15 PM Medical Record Number: [id number] Patient Account Number: [id number] Date of Birth/Sex: Treating RN: 1960-10-05 (58 y.o. [name] Primary Care Suede Greenawalt: [name] Other Clinician: Referring Emileo Semel: Treating Sharunda Salmon/Extender: Con Memos, Enobong Weeks in Treatment: 1 Compression Therapy Performed for Wound Assessment: Wound #2 Left,Lateral Lower Leg Performed By: Clinician Kela Millin, RN Compression Type: Four Layer Post Procedure Diagnosis Same as Pre-procedure Electronic Signature(s) Signed: 11/03/2019 4:58:13 PM By: Kela Millin Entered By: Kela Millin on 11/03/2019 16:36:37 -------------------------------------------------------------------------------- Compression Therapy Details Patient Name: Date of Service: Jeffery Porter T. 11/03/2019 3:15 PM Medical Record Number: [id number] Patient Account Number: [id number] Date of Birth/Sex: Treating RN: Aug 06, 1960 (59 y.o. [name] Primary Care Azaan Leask: [name] Other Clinician: Referring Ole Lafon: Treating Asucena Galer/Extender: Con Memos, Enobong Weeks in Treatment: 1 Compression Therapy Performed for Wound Assessment: Wound #5 Right,Medial Lower Leg Performed By: Clinician Kela Millin, RN Compression Type: Four Layer Post Procedure Diagnosis Same as Pre-procedure Electronic Signature(s) Signed: 11/03/2019 4:58:13 PM By: Kela Millin Entered By: Kela Millin on 11/03/2019 16:36:37 -------------------------------------------------------------------------------- Compression Therapy Details Patient Name: Date of Service: Jeffery Porter T. 11/03/2019 3:15 PM Medical Record Number: [id number] Patient Account Number: [id number] Date of Birth/Sex: Treating RN: [birth year] (59 y.o. [name] Primary Care Korben Carcione: [name] Other Clinician: Referring Spencer Cardinal: Treating Phoenix Riesen/Extender: Con Memos, Enobong Weeks in Treatment: 1 Compression Therapy Performed for Wound Assessment: Wound #6 Right,Lateral Lower Leg Performed By: Clinician Kela Millin, RN Compression Type: Four Layer Post Procedure Diagnosis Same as Pre-procedure Electronic Signature(s) Signed: 11/03/2019 4:58:13 PM By: Kela Millin Entered By: Kela Millin on 11/03/2019 16:36:37 -------------------------------------------------------------------------------- Compression Therapy Details  Patient Name: Date of Service: Jeffery Porter T. 11/03/2019 3:15 PM Medical Record Number: [id number] Patient Account Number: [id number] Date of Birth/Sex: Treating RN: [birth year] (59 y.o. [name] Primary Care Wessley Emert: [name] Other Clinician: Referring Larita Deremer: Treating Elfego Giammarino/Extender: Con Memos, Enobong Weeks in Treatment: 1 Compression Therapy Performed for Wound Assessment: Wound #8 Right,Proximal,Lateral Foot Performed By: Clinician Kela Millin, RN Compression Type: Four Layer Post Procedure Diagnosis Same as Pre-procedure Electronic Signature(s) Signed: 11/03/2019 4:58:13 PM By: Kela Millin Entered By: Kela Millin on 11/03/2019 16:36:37 -------------------------------------------------------------------------------- Compression Therapy Details Patient Name: Date of Service: Jeffery Porter T. 11/03/2019 3:15 PM Medical Record Number: [id number] Patient Account  Number: [id number] Date of Birth/Sex: Treating RN: [birth year] (59 y.o. [name] Primary Care Kenric Ginger: [name] Other Clinician: Referring Dennis Hegeman: Treating Akane Tessier/Extender: Con Memos, Enobong Weeks in Treatment: 1 Compression Therapy Performed for Wound Assessment: Wound #7 Right,Lateral Foot Performed By: Clinician Kela Millin, RN Compression Type: Four Layer Post Procedure Diagnosis Same as Pre-procedure Electronic Signature(s) Signed: 11/03/2019 4:58:13 PM By: Kela Millin Entered By: Kela Millin on 11/03/2019 16:36:37 -------------------------------------------------------------------------------- Encounter Discharge Information Details Patient Name: Date of Service: Jeffery Porter T. 11/03/2019 3:15 PM Medical Record Number: [id number] Patient Account Number: [id number] Date of Birth/Sex: Treating RN: Feb 15, 1961 (58 y.o. Jerilynn Mages) Carlene Coria Primary Care Murl Golladay: Other Clinician: [name] Referring Azavier Creson: Treating Anahla Bevis/Extender: Con Memos, Enobong Weeks in Treatment: 1 Encounter Discharge Information Items Post Procedure Vitals Discharge Condition: Stable Temperature (F): 98 Ambulatory Status: Walker Pulse (bpm): 85 Discharge Destination: Home Respiratory Rate (breaths/min): 20 Transportation: Private Auto Blood Pressure (mmHg): 156/83 Accompanied By: self Schedule Follow-up Appointment: Yes Clinical Summary of Care: Patient Declined Electronic Signature(s) Signed: 11/07/2019 5:05:11 PM By: Carlene Coria RN Entered By: Carlene Coria on 11/03/2019 17:14:17 -------------------------------------------------------------------------------- Lower Extremity Assessment Details Patient Name: Date of Service: Jeffery [name] 11/03/2019 3:15 PM Medical Record Number: [id number] Patient Account Number: [id number] Date of Birth/Sex: Treating RN: Apr 13, 1960 (58 y.o. Oval Linsey Primary Care  Kemora Pinard: [name] Other Clinician: Referring Lyndell Gillyard: Treating Verita Kuroda/Extender: Con Memos, Enobong Weeks in Treatment: 1 Edema Assessment Assessed: [Left: No] [Right: No] E[Left: dema] [Right: :] Calf Left: Right: Point of Measurement: 48 cm From Medial Instep 49 cm 42 cm Ankle Left: Right: Point of Measurement: 14 cm From Medial Instep 29 cm 25 cm Electronic Signature(s) Signed: 11/03/2019 4:42:20 PM By: Carlene Coria RN Entered By: Carlene Coria on 11/03/2019 16:08:24 -------------------------------------------------------------------------------- Multi-Disciplinary Care Plan Details Patient Name: Date of Service: Jeffery Porter T. 11/03/2019 3:15 PM Medical Record Number: [id number] Patient Account Number: [id number] Date of Birth/Sex: Treating RN: September 03, 1960 (59 y.o. [name] Primary Care Raegan Winders: [name] Other Clinician: Referring Chantae Soo: Treating Yarenis Cerino/Extender: Con Memos, Enobong Weeks in Treatment: 1 Active Inactive Abuse / Safety / Falls / Self Care Management Nursing Diagnoses: Potential for falls Potential for injury related to falls Goals: Patient will remain injury free related to falls Date Initiated: 10/23/2019 Target Resolution Date: 11/24/2019 Goal Status: Active Patient/caregiver will verbalize/demonstrate measures taken to prevent injury and/or falls Date Initiated: 10/23/2019 Target Resolution Date: 11/24/2019 Goal Status: Active Interventions: Assess Activities of Daily Living upon admission and as needed Assess fall risk on admission and as needed Assess: immobility, friction, shearing, incontinence upon admission and as needed Assess impairment of mobility on admission and as needed per policy Assess personal safety and home safety (as indicated) on admission and as needed  Assess self care needs on admission and as needed Provide education on fall prevention Provide education on  personal and home safety Notes: Nutrition Nursing Diagnoses: Impaired glucose control: actual or potential Potential for alteratiion in Nutrition/Potential for imbalanced nutrition Goals: Patient/caregiver agrees to and verbalizes understanding of need to use nutritional supplements and/or vitamins as prescribed Date Initiated: 10/23/2019 Target Resolution Date: 11/24/2019 Goal Status: Active Patient/caregiver will maintain therapeutic glucose control Date Initiated: 10/23/2019 Target Resolution Date: 11/24/2019 Goal Status: Active Interventions: Assess HgA1c results as ordered upon admission and as needed Assess patient nutrition upon admission and as needed per policy Provide education on elevated blood sugars and impact on wound healing Provide education on nutrition Treatment Activities: Education provided on Nutrition : 10/23/2019 Notes: Venous Leg Ulcer Nursing Diagnoses: Knowledge deficit related to disease process and management Potential for venous Insuffiency (use before diagnosis confirmed) Goals: Patient will maintain optimal edema control Date Initiated: 10/23/2019 Target Resolution Date: 11/24/2019 Goal Status: Active Patient/caregiver will verbalize understanding of disease process and disease management Date Initiated: 10/23/2019 Target Resolution Date: 11/24/2019 Goal Status: Active Interventions: Assess peripheral edema status every visit. Compression as ordered Provide education on venous insufficiency Notes: Wound/Skin Impairment Nursing Diagnoses: Impaired tissue integrity Knowledge deficit related to ulceration/compromised skin integrity Goals: Patient/caregiver will verbalize understanding of skin care regimen Date Initiated: 10/23/2019 Target Resolution Date: 11/24/2019 Goal Status: Active Interventions: Assess patient/caregiver ability to obtain necessary supplies Assess patient/caregiver ability to perform ulcer/skin care regimen upon admission and  as needed Assess ulceration(s) every visit Provide education on ulcer and skin care Notes: Electronic Signature(s) Signed: 11/03/2019 4:58:13 PM By: Kela Millin Entered By: Kela Millin on 11/03/2019 16:07:11 -------------------------------------------------------------------------------- Pain Assessment Details Patient Name: Date of Service: Jeffery Porter T. 11/03/2019 3:15 PM Medical Record Number: [id number] Patient Account Number: [id number] Date of Birth/Sex: Treating RN: 1960/04/13 (59 y.o. [name] Primary Care Tanaysia Bhardwaj: [name] Other Clinician: Referring Jahfari Ambers: Treating Khayden Herzberg/Extender: Con Memos, Enobong Weeks in Treatment: 1 Active Problems Location of Pain Severity and Description of Pain Patient Has Paino Yes Site Locations Rate the pain. Current Pain Level: 10 Pain Management and Medication Current Pain Management: Electronic Signature(s) Signed: 11/03/2019 4:58:13 PM By: Kela Millin Signed: 11/07/2019 10:17:44 AM By: Sandre Kitty Entered By: Sandre Kitty on 11/03/2019 15:46:18 -------------------------------------------------------------------------------- Patient/Caregiver Education Details Patient Name: Date of Service: Jeffery [name] 8/6/2021andnbsp3:15 PM Medical Record Number: [id number] Patient Account Number: [id number] Date of Birth/Gender: Treating RN: 02/05/1961 (59 y.o. [name] Primary Care Physician: [name] Other Clinician: Referring Physician: Treating Physician/Extender: Perlie Mayo Weeks in Treatment: 1 Education Assessment Education Provided To: Patient Education Topics Provided Nutrition: Handouts: Elevated Blood Sugars: How Do They Affect Wound Healing Methods: Explain/Verbal Responses: State content correctly Wound/Skin Impairment: Handouts: Caring for Your Ulcer Methods: Explain/Verbal Responses: State content  correctly Electronic Signature(s) Signed: 11/03/2019 4:58:13 PM By: Kela Millin Entered By: Kela Millin on 11/03/2019 16:07:29 -------------------------------------------------------------------------------- Wound Assessment Details Patient Name: Date of Service: Jeffery Porter T. 11/03/2019 3:15 PM Medical Record Number: [id number] Patient Account Number: [id number] Date of Birth/Sex: Treating RN: 19-Mar-1961 (59 y.o. [name] Primary Care Ugo Thoma: [name] Other Clinician: Referring Takeysha Bonk: Treating Aliscia Clayton/Extender: Con Memos, Enobong Weeks in Treatment: 1 Wound Status Wound Number: 1 Primary Etiology: Diabetic Wound/Ulcer of the Lower Extremity Wound Location: Left, Proximal, Anterior Lower Leg Wound Status: Open Wounding Event: Gradually Appeared Comorbid History: Congestive Heart Failure, Hypertension, Type II Diabetes Date Acquired: 08/09/2019  Weeks Of Treatment: 1 Clustered Wound: No Photos Photo Uploaded By: Mikeal Hawthorne on 11/06/2019 15:10:05 Wound Measurements Length: (cm) 2.5 Width: (cm) 1.8 Depth: (cm) 0.1 Area: (cm) 3.534 Volume: (cm) 0.353 % Reduction in Area: -2.3% % Reduction in Volume: -2% Epithelialization: None Tunneling: No Undermining: No Wound Description Classification: Grade 2 Exudate Amount: Medium Exudate Type: Serosanguineous Exudate Color: red, [place] Foul Odor After Cleansing: No Slough/Fibrino Yes Wound Bed Granulation Amount: Large (67-100%) Exposed Structure Granulation Quality: Red Fascia Exposed: No Necrotic Amount: Small (1-33%) Fat Layer (Subcutaneous Tissue) Exposed: Yes Necrotic Quality: Adherent Slough Tendon Exposed: No Muscle Exposed: No Joint Exposed: No Bone Exposed: No Treatment Notes Wound #1 (Left, Proximal, Anterior Lower Leg) 1. Cleanse With Wound Cleanser Soap and water 3. Primary Dressing Applied Calcium Alginate Ag 4. Secondary Dressing Dry Gauze 6.  Support Layer Applied 4 layer compression wrap Notes netting Electronic Signature(s) Signed: 11/03/2019 4:42:20 PM By: Carlene Coria RN Signed: 11/03/2019 4:58:13 PM By: Kela Millin Entered By: Carlene Coria on 11/03/2019 16:08:50 -------------------------------------------------------------------------------- Wound Assessment Details Patient Name: Date of Service: Jeffery Porter T. 11/03/2019 3:15 PM Medical Record Number: [id number] Patient Account Number: [id number] Date of Birth/Sex: Treating RN: Dec 07, 1960 (59 y.o. [name] Primary Care Keeley Sussman: [name] Other Clinician: Referring Nick Armel: Treating Nyair Depaulo/Extender: Con Memos, Enobong Weeks in Treatment: 1 Wound Status Wound Number: 10 Primary Etiology: Diabetic Wound/Ulcer of the Lower Extremity Wound Location: Left T Second oe Wound Status: Open Wounding Event: Gradually Appeared Comorbid History: Congestive Heart Failure, Hypertension, Type II Diabetes Date Acquired: 10/23/2019 Weeks Of Treatment: 1 Clustered Wound: No Photos Photo Uploaded By: Mikeal Hawthorne on 11/06/2019 15:11:13 Wound Measurements Length: (cm) 0.1 Width: (cm) 0.1 Depth: (cm) 0.1 Area: (cm) 0.008 Volume: (cm) 0.001 % Reduction in Area: 99.8% % Reduction in Volume: 99.7% Epithelialization: None Tunneling: No Undermining: No Wound Description Classification: Grade 2 Wound Margin: Flat and Intact Exudate Amount: None Present Foul Odor After Cleansing: No Slough/Fibrino Yes Wound Bed Granulation Amount: None Present (0%) Exposed Structure Necrotic Amount: Large (67-100%) Fascia Exposed: No Necrotic Quality: Adherent Slough Fat Layer (Subcutaneous Tissue) Exposed: No Tendon Exposed: No Muscle Exposed: No Joint Exposed: No Bone Exposed: No Treatment Notes Wound #10 (Left Toe Second) 1. Cleanse With Wound Cleanser Soap and water 3. Primary Dressing Applied Calcium Alginate Ag 4. Secondary  Dressing Dry Gauze 5. Secured With Tape Notes Horticulturist, commercial) Signed: 11/03/2019 4:42:20 PM By: Carlene Coria RN Signed: 11/03/2019 4:58:13 PM By: Kela Millin Entered By: Carlene Coria on 11/03/2019 16:09:41 -------------------------------------------------------------------------------- Wound Assessment Details Patient Name: Date of Service: Jeffery Porter T. 11/03/2019 3:15 PM Medical Record Number: [id number] Patient Account Number: [id number] Date of Birth/Sex: Treating RN: Dec 21, 1960 (59 y.o. [name] Primary Care Hank Walling: [name] Other Clinician: Referring Phelicia Dantes: Treating Anise Harbin/Extender: Con Memos, Enobong Weeks in Treatment: 1 Wound Status Wound Number: 2 Primary Etiology: Diabetic Wound/Ulcer of the Lower Extremity Wound Location: Left, Lateral Lower Leg Wound Status: Open Wounding Event: Gradually Appeared Comorbid History: Congestive Heart Failure, Hypertension, Type II Diabetes Date Acquired: 08/09/2019 Weeks Of Treatment: 1 Clustered Wound: No Photos Photo Uploaded By: Mikeal Hawthorne on 11/06/2019 15:11:14 Wound Measurements Length: (cm) 0.2 % Re Width: (cm) 0.2 % Re Depth: (cm) 0.1 Epit Area: (cm) 0.031 Tun Volume: (cm) 0.003 Und duction in Area: 90.1% duction in Volume: 90.3% helialization: None neling: No ermining: No Wound Description Classification: Grade 2 Foul Exudate Amount: Small Slou Exudate Type: Serosanguineous Exudate Color: red, [place]  Odor After Cleansing: No gh/Fibrino Yes Wound Bed Granulation Amount: Large (67-100%) Exposed Structure Granulation Quality: Pink Fascia Exposed: No Necrotic Amount: None Present (0%) Fat Layer (Subcutaneous Tissue) Exposed: Yes Tendon Exposed: No Muscle Exposed: No Joint Exposed: No Bone Exposed: No Treatment Notes Wound #2 (Left, Lateral Lower Leg) 1. Cleanse With Wound Cleanser Soap and water 3. Primary Dressing Applied Calcium  Alginate Ag 4. Secondary Dressing Dry Gauze 6. Support Layer Applied 4 layer compression wrap Notes netting Electronic Signature(s) Signed: 11/03/2019 4:42:20 PM By: Carlene Coria RN Signed: 11/03/2019 4:58:13 PM By: Kela Millin Entered By: Carlene Coria on 11/03/2019 16:10:50 -------------------------------------------------------------------------------- Wound Assessment Details Patient Name: Date of Service: Jeffery Porter T. 11/03/2019 3:15 PM Medical Record Number: [id number] Patient Account Number: [id number] Date of Birth/Sex: Treating RN: 1961/01/03 (59 y.o. [name] Primary Care Adriona Kaney: [name] Other Clinician: Referring Lillyn Wieczorek: Treating Gurnie Duris/Extender: Con Memos, Enobong Weeks in Treatment: 1 Wound Status Wound Number: 3 Primary Etiology: Diabetic Wound/Ulcer of the Lower Extremity Wound Location: Left, Medial Lower Leg Wound Status: Open Wounding Event: Gradually Appeared Comorbid History: Congestive Heart Failure, Hypertension, Type II Diabetes Date Acquired: 08/09/2019 Weeks Of Treatment: 1 Clustered Wound: No Photos Photo Uploaded By: Mikeal Hawthorne on 11/06/2019 15:09:32 Wound Measurements Length: (cm) Width: (cm) Depth: (cm) Area: (cm) Volume: (cm) 0 % Reduction in Area: 100% 0 % Reduction in Volume: 100% 0 Epithelialization: Large (67-100%) 0 Tunneling: No 0 Undermining: No Wound Description Classification: Grade 2 Exudate Amount: None Present Foul Odor After Cleansing: No Slough/Fibrino No Wound Bed Granulation Amount: None Present (0%) Exposed Structure Necrotic Amount: None Present (0%) Fascia Exposed: No Fat Layer (Subcutaneous Tissue) Exposed: No Tendon Exposed: No Muscle Exposed: No Joint Exposed: No Bone Exposed: No Electronic Signature(s) Signed: 11/03/2019 4:58:13 PM By: Kela Millin Signed: 11/07/2019 10:17:44 AM By: Sandre Kitty Entered By: Sandre Kitty on 11/03/2019  16:01:27 -------------------------------------------------------------------------------- Wound Assessment Details Patient Name: Date of Service: Jeffery Porter T. 11/03/2019 3:15 PM Medical Record Number: [id number] Patient Account Number: [id number] Date of Birth/Sex: Treating RN: 02-Oct-1960 (59 y.o. [name] Primary Care Zulma Court: [name] Other Clinician: Referring Kathyjo Briere: Treating Skilar Marcou/Extender: Con Memos, Enobong Weeks in Treatment: 1 Wound Status Wound Number: 4 Primary Etiology: Diabetic Wound/Ulcer of the Lower Extremity Wound Location: Right T Second oe Wound Status: Open Wounding Event: Gradually Appeared Comorbid History: Congestive Heart Failure, Hypertension, Type II Diabetes Date Acquired: 09/28/2019 Weeks Of Treatment: 1 Clustered Wound: No Photos Photo Uploaded By: Mikeal Hawthorne on 11/06/2019 15:17:02 Wound Measurements Length: (cm) Width: (cm) Depth: (cm) Area: (cm) Volume: (cm) 0 % Reduction in Area: 100% 0 % Reduction in Volume: 100% 0 Epithelialization: Large (67-100%) 0 Tunneling: No 0 Undermining: No Wound Description Classification: Grade 2 Exudate Amount: None Present Foul Odor After Cleansing: No Slough/Fibrino No Wound Bed Granulation Amount: None Present (0%) Exposed Structure Necrotic Amount: None Present (0%) Fascia Exposed: No Fat Layer (Subcutaneous Tissue) Exposed: No Tendon Exposed: No Muscle Exposed: No Joint Exposed: No Bone Exposed: No Electronic Signature(s) Signed: 11/03/2019 4:58:13 PM By: Kela Millin Signed: 11/07/2019 10:17:44 AM By: Sandre Kitty Entered By: Sandre Kitty on 11/03/2019 16:03:12 -------------------------------------------------------------------------------- Wound Assessment Details Patient Name: Date of Service: Jeffery Porter T. 11/03/2019 3:15 PM Medical Record Number: [id number] Patient Account Number: [id number] Date of  Birth/Sex: Treating RN: Jan 29, 1961 (59 y.o. [name] Primary Care Mylan Schwarz: [name] Other Clinician: Referring Zayin Valadez: Treating Carlyle Achenbach/Extender: Con Memos, Enobong Weeks in Treatment: 1  Wound Status Wound Number: 5 Primary Etiology: Diabetic Wound/Ulcer of the Lower Extremity Wound Location: Right, Medial Lower Leg Wound Status: Open Wounding Event: Gradually Appeared Comorbid History: Congestive Heart Failure, Hypertension, Type II Diabetes Date Acquired: 08/09/2019 Weeks Of Treatment: 1 Clustered Wound: No Photos Photo Uploaded By: Mikeal Hawthorne on 11/06/2019 15:16:29 Wound Measurements Length: (cm) 0.2 % Redu Width: (cm) 0.2 % Redu Depth: (cm) 0.1 Epithe Area: (cm) 0.031 Tunne Volume: (cm) 0.003 Under ction in Area: 98.7% ction in Volume: 98.8% lialization: None ling: No mining: No Wound Description Classification: Grade 2 Foul O Exudate Amount: Medium Slough Exudate Type: Serosanguineous Exudate Color: red, [place] dor After Cleansing: No /Fibrino Yes Wound Bed Granulation Amount: Large (67-100%) Exposed Structure Granulation Quality: Red Fascia Exposed: No Necrotic Amount: Small (1-33%) Fat Layer (Subcutaneous Tissue) Exposed: Yes Necrotic Quality: Adherent Slough Tendon Exposed: No Muscle Exposed: No Joint Exposed: No Bone Exposed: No Treatment Notes Wound #5 (Right, Medial Lower Leg) 1. Cleanse With Wound Cleanser Soap and water 3. Primary Dressing Applied Calcium Alginate Ag 4. Secondary Dressing Dry Gauze 6. Support Layer Applied 4 layer compression wrap Notes netting Electronic Signature(s) Signed: 11/03/2019 4:42:20 PM By: Carlene Coria RN Signed: 11/03/2019 4:58:13 PM By: Kela Millin Entered By: Carlene Coria on 11/03/2019 16:11:03 -------------------------------------------------------------------------------- Wound Assessment Details Patient Name: Date of Service: Jeffery Porter T. 11/03/2019  3:15 PM Medical Record Number: [id number] Patient Account Number: [id number] Date of Birth/Sex: Treating RN: 26-Dec-1960 (59 y.o. [name] Primary Care Herchel Hopkin: [name] Other Clinician: Referring Jonise Weightman: Treating Raygen Linquist/Extender: Con Memos, Enobong Weeks in Treatment: 1 Wound Status Wound Number: 6 Primary Etiology: Diabetic Wound/Ulcer of the Lower Extremity Wound Location: Right, Lateral Lower Leg Wound Status: Open Wounding Event: Gradually Appeared Comorbid History: Congestive Heart Failure, Hypertension, Type II Diabetes Date Acquired: 08/09/2019 Weeks Of Treatment: 1 Clustered Wound: Yes Photos Photo Uploaded By: Mikeal Hawthorne on 11/06/2019 15:17:02 Wound Measurements Length: (cm) 0.1 Width: (cm) 0.1 Depth: (cm) 0.1 Area: (cm) 0.008 Volume: (cm) 0.001 % Reduction in Area: 99.9% % Reduction in Volume: 99.9% Epithelialization: Large (67-100%) Tunneling: No Undermining: No Wound Description Classification: Grade 2 Exudate Amount: None Present Foul Odor After Cleansing: No Slough/Fibrino No Wound Bed Granulation Amount: Large (67-100%) Exposed Structure Granulation Quality: Red Fascia Exposed: No Necrotic Amount: None Present (0%) Fat Layer (Subcutaneous Tissue) Exposed: Yes Tendon Exposed: No Muscle Exposed: No Joint Exposed: No Bone Exposed: No Treatment Notes Wound #6 (Right, Lateral Lower Leg) 1. Cleanse With Wound Cleanser Soap and water 3. Primary Dressing Applied Calcium Alginate Ag 4. Secondary Dressing Dry Gauze 6. Support Layer Applied 4 layer compression wrap Notes netting Electronic Signature(s) Signed: 11/03/2019 4:42:20 PM By: Carlene Coria RN Signed: 11/03/2019 4:58:13 PM By: Kela Millin Entered By: Carlene Coria on 11/03/2019 16:11:22 -------------------------------------------------------------------------------- Wound Assessment Details Patient Name: Date of Service: Jeffery Porter T.  11/03/2019 3:15 PM Medical Record Number: [id number] Patient Account Number: [id number] Date of Birth/Sex: Treating RN: 07-30-1960 (59 y.o. [name] Primary Care Rhys Anchondo: [name] Other Clinician: Referring Barrington Worley: Treating Anelise Staron/Extender: Con Memos, Enobong Weeks in Treatment: 1 Wound Status Wound Number: 7 Primary Etiology: Diabetic Wound/Ulcer of the Lower Extremity Wound Location: Right, Lateral Foot Wound Status: Open Wounding Event: Gradually Appeared Comorbid History: Congestive Heart Failure, Hypertension, Type II Diabetes Date Acquired: 08/02/2019 Weeks Of Treatment: 1 Clustered Wound: No Photos Photo Uploaded By: Mikeal Hawthorne on 11/06/2019 15:18:00 Wound Measurements Length: (cm) 4.5 Width: (cm) 2.7 Depth: (cm) 0.3 Area: (  cm) 9.543 Volume: (cm) 2.863 % Reduction in Area: 10% % Reduction in Volume: 61.4% Epithelialization: None Tunneling: No Undermining: No Wound Description Classification: Grade 2 Exudate Amount: Medium Exudate Type: Serosanguineous Exudate Color: red, [place] Foul Odor After Cleansing: No Slough/Fibrino Yes Wound Bed Granulation Amount: Medium (34-66%) Exposed Structure Granulation Quality: Red, Pink Fascia Exposed: No Necrotic Amount: Medium (34-66%) Fat Layer (Subcutaneous Tissue) Exposed: Yes Necrotic Quality: Adherent Slough Tendon Exposed: No Muscle Exposed: No Joint Exposed: No Bone Exposed: No Treatment Notes Wound #7 (Right, Lateral Foot) 1. Cleanse With Wound Cleanser Soap and water 3. Primary Dressing Applied Hydrofera Blue 4. Secondary Dressing Dry Gauze 6. Support Layer Applied 4 layer compression wrap Notes netting Electronic Signature(s) Signed: 11/03/2019 4:58:13 PM By: Kela Millin Signed: 11/07/2019 10:17:44 AM By: Sandre Kitty Entered By: Sandre Kitty on 11/03/2019 16:07:27 -------------------------------------------------------------------------------- Wound  Assessment Details Patient Name: Date of Service: Jeffery Porter T. 11/03/2019 3:15 PM Medical Record Number: [id number] Patient Account Number: [id number] Date of Birth/Sex: Treating RN: 1960/09/11 (59 y.o. [name] Primary Care Elexa Kivi: [name] Other Clinician: Referring Sparrow [place]: Treating Rmoni Keplinger/Extender: Con Memos, Enobong Weeks in Treatment: 1 Wound Status Wound Number: 8 Primary Etiology: Diabetic Wound/Ulcer of the Lower Extremity Wound Location: Right, Proximal, Lateral Foot Wound Status: Open Wounding Event: Gradually Appeared Comorbid History: Congestive Heart Failure, Hypertension, Type II Diabetes Date Acquired: 08/02/2019 Weeks Of Treatment: 1 Clustered Wound: No Photos Photo Uploaded By: Mikeal Hawthorne on 11/06/2019 15:18:37 Wound Measurements Length: (cm) 0.3 Width: (cm) 0.3 Depth: (cm) 0.2 Area: (cm) 0.071 Volume: (cm) 0.014 % Reduction in Area: 43.7% % Reduction in Volume: 72% Epithelialization: Medium (34-66%) Tunneling: No Undermining: No Wound Description Classification: Grade 2 Exudate Amount: Medium Exudate Type: Serosanguineous Exudate Color: red, Shasteen Foul Odor After Cleansing: No Slough/Fibrino No Wound Bed Granulation Amount: Large (67-100%) Exposed Structure Granulation Quality: Pink Fascia Exposed: No Necrotic Amount: None Present (0%) Fat Layer (Subcutaneous Tissue) Exposed: No Tendon Exposed: No Muscle Exposed: No Joint Exposed: No Bone Exposed: No Treatment Notes Wound #8 (Right, Proximal, Lateral Foot) 1. Cleanse With Wound Cleanser Soap and water 3. Primary Dressing Applied Calcium Alginate Ag 4. Secondary Dressing Dry Gauze 6. Support Layer Applied 4 layer compression wrap Notes netting Electronic Signature(s) Signed: 11/03/2019 4:42:20 PM By: Carlene Coria RN Signed: 11/03/2019 4:58:13 PM By: Kela Millin Entered By: Carlene Coria on 11/03/2019  16:11:39 -------------------------------------------------------------------------------- Wound Assessment Details Patient Name: Date of Service: Jeffery Porter T. 11/03/2019 3:15 PM Medical Record Number: [id number] Patient Account Number: [id number] Date of Birth/Sex: Treating RN: 1961-01-14 (59 y.o. [name] Primary Care Byard Carranza: [name] Other Clinician: Referring Laketta Soderberg: Treating Ramone Gander/Extender: Con Memos, Enobong Weeks in Treatment: 1 Wound Status Wound Number: 9 Primary Etiology: Skin Tear Wound Location: Left Forearm Wound Status: Open Wounding Event: Trauma Comorbid History: Congestive Heart Failure, Hypertension, Type II Diabetes Date Acquired: 10/20/2019 Weeks Of Treatment: 1 Clustered Wound: No Photos Photo Uploaded By: Mikeal Hawthorne on 11/06/2019 15:09:09 Wound Measurements Length: (cm) Width: (cm) Depth: (cm) Area: (cm) Volume: (cm) 0 % Reduction in Area: 100% 0 % Reduction in Volume: 100% 0 Epithelialization: Large (67-100%) 0 Tunneling: No 0 Undermining: No Wound Description Classification: Full Thickness Without Exposed Support Structures Exudate Amount: None Present Foul Odor After Cleansing: No Slough/Fibrino No Wound Bed Granulation Amount: None Present (0%) Exposed Structure Necrotic Amount: None Present (0%) Fascia Exposed: No Fat Layer (Subcutaneous Tissue) Exposed: No Tendon Exposed: No Muscle Exposed: No Joint Exposed: No Bone  Exposed: No Electronic Signature(s) Signed: 11/03/2019 4:58:13 PM By: Kela Millin Signed: 11/07/2019 10:17:44 AM By: Sandre Kitty Entered By: Sandre Kitty on 11/03/2019 16:01:50 -------------------------------------------------------------------------------- Vitals Details Patient Name: Date of Service: [name], [place] T. 11/03/2019 3:15 PM Medical Record Number: [id number] Patient Account Number: [id number] Date of Birth/Sex: Treating RN: [birth year] (59  y.o. [name] Primary Care Nisha Dhami: [name] Other Clinician: Referring Filemon Breton: Treating Jolynne Spurgin/Extender: Con Memos, Enobong Weeks in Treatment: 1 Vital Signs Time Taken: 15:44 Temperature (F): 98.0 Height (in): 74 Pulse (bpm): 85 Weight (lbs): 345 Respiratory Rate (breaths/min): 20 Body Mass Index (BMI): 44.3 Blood Pressure (mmHg): 156/83 Reference Range: 80 - 120 mg / dl Notes Patient states he rolled off his bed last night while sleep and hurt his rib he is in a lot of pain. Electronic Signature(s) Signed: 11/07/2019 10:17:44 AM By: Sandre Kitty Entered By: Sandre Kitty on 11/03/2019 15:46:08

## 2019-11-07 NOTE — Progress Notes (Signed)
Office Visit Note   Patient: Jeffery [name]           Date of Birth: [birth year]           MRN: [id number] Visit Date: 11/07/2019              Requested by: [name], MD [place],  [place] [id number] PCP: [name], MD   Assessment & Plan: Visit Diagnoses:  1. Chronic pain of left knee     Plan: Impression is chronic left knee pain.  Today, I aspirated approximately 15 cc of serosanguineous fluid from the left knee and then injected this with cortisone.  He will follow-up with [place] as needed.  Follow-Up Instructions: Return if symptoms worsen or fail to improve.   Orders:  Orders Placed This Encounter  Procedures  . Large Joint Inj: L knee   No orders of the defined types were placed in this encounter.     Procedures: Large Joint Inj: L knee on 11/07/2019 2:49 PM Indications: pain Details: 22 G needle, anterolateral approach Medications: 2 mL lidocaine 1 %; 2 mL bupivacaine 0.25 %; 80 mg methylPREDNISolone acetate 40 MG/ML      Clinical Data: No additional findings.   Subjective: Chief Complaint  Patient presents with  . Left Knee - Pain    HPI Jeffery [name] is a pleasant 59 year old gentleman who comes in today with continued left knee pain.  He was recently seen by [place] for an acute gouty flareup with underlying left lower extremity cellulitis and lymphedema for which she was hospitalized for sepsis.  Due to his multiple medical comorbidities we have been allowing the PCP to manage his gout.  He notes that he has been taking one half allopurinol which has seemed to help his symptoms.  He is still getting mild swelling which in addition to his pain makes him feel unstable to the left knee.  Review of Systems as detailed in HPI.  All others reviewed and are negative.   Objective: Vital Signs: There were no vitals taken for this visit.  Physical Exam well-developed well-nourished gentleman in no acute distress.  Alert oriented x3.  Ortho  Exam examination of the left knee shows a 1+ effusion.  No erythema and no warmth.  Range of motion from 0 to 90 degrees.  Mild medial lateral joint line tenderness.  Specialty Comments:  No specialty comments available.  Imaging: No new imaging   PMFS History: Patient Active Problem List   Diagnosis Date Noted  . Hypothyroidism 10/11/2019  . Venous stasis   . Acute pain of left knee   . Left leg cellulitis 09/07/2019  . Sepsis ([place]) 09/07/2019  . Right foot ulcer, with fat layer exposed ([place]) 08/08/2019  . Cellulitis of right lower extremity 08/08/2019  . Mixed diabetic hyperlipidemia associated with type 2 diabetes mellitus ([place]) 08/08/2019  . Severe sepsis with acute organ dysfunction due to methicillin susceptible Staphylococcus aureus (MSSA) ([place]) 08/08/2019  . AKI (acute kidney injury) ([place]) 08/08/2019  . Sepsis due to methicillin susceptible Staphylococcus aureus (MSSA) with acute renal failure ([place]) 08/08/2019  . Persistent atrial fibrillation ([place])   . Testosterone deficiency 03/16/2018  . Chronic left shoulder pain 03/01/2018  . Body mass index 40.0-44.9, adult ([place]) 03/01/2018  . DDD (degenerative disc disease), lumbar 12/15/2017  . Chronic anticoagulation 10/26/2017  . Dyspnea 10/26/2017  . S/P right rotator cuff repair 04/05/2017  . Achilles tendon contracture, right 01/05/2017  . Closed nondisplaced fracture of distal  phalanx of right great toe 01/05/2017  . Paroxysmal atrial fibrillation ([place]) 10/30/2016  . Pain in right ankle and joints of right foot 07/09/2016  . Uncontrolled type 2 diabetes mellitus with diabetic polyneuropathy, without long-term current use of insulin ([place]) 07/09/2016  . 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 Impingement syndrome of right shoulder 07/07/2016  . Morbid obesity ([place]) 06/05/2016  . S/P TKR (total knee replacement), right 03/28/2015  . Knee osteoarthritis 11/12/2014  .  Essential hypertension 10/15/2014  . Cellulitis of left lower extremity   . Charcot foot due to diabetes mellitus ([place]) 10/01/2014  . Accelerated hypertension 10/01/2014  . Gangrene left third toe 10/01/2014   Past Medical History:  Diagnosis Date  . Atrial fibrillation ([place])   . Cellulitis and abscess of foot 07/2019   RIGHT FOOT  . Charcot's joint of right foot   . DDD (degenerative disc disease), lumbar   . Diabetes mellitus without complication ([place])   . Fatty liver   . Hypertension   . Obesity   . Rotator cuff disorder   . Shoulder impingement, right   . Spinal stenosis at L4-L5 level     Family History  Problem Relation Age of Onset  . Diabetes Father   . Hypertension Father   . Heart failure Father     Past Surgical History:  Procedure Laterality Date  . AMPUTATION Left 10/02/2014   Procedure: Left Third toe amputation ;  Surgeon: [name], MD;  Location: [facility];  Service: Orthopedics;  Laterality: Left;  Regular bed, wants to follow hip  . ANTERIOR CRUCIATE LIGAMENT REPAIR Right 90   reconstruction  . APPLICATION OF WOUND VAC Left 10/02/2014   Procedure: APPLICATION OF WOUND VAC; toe Surgeon: [name], MD;  Location: [facility];  Service: Orthopedics;  Laterality: Left;  . CARDIOVERSION N/A 04/26/2019   Procedure: CARDIOVERSION;  Surgeon: Pixie Casino, MD;  Location: [facility] ENDOSCOPY;  Service: Cardiovascular;  Laterality: N/A;  . CARDIOVERSION N/A 05/18/2019   Procedure: CARDIOVERSION (CATH LAB);  Surgeon: Constance Haw, MD;  Location: [facility] CV LAB;  Service: Cardiovascular;  Laterality: N/A;  . I & D EXTREMITY Left 10/05/2014   Procedure: IRRIGATION AND DEBRIDEMENT LEFT FOOT;  Surgeon: [name], MD;  Location: [facility];  Service: Orthopedics;  Laterality: Left;  . INCISION AND DRAINAGE Right 08/09/2019   Procedure: INCISION AND DRAINAGE RIGHT FOOT;  Surgeon: Trula Slade, DPM;  Location: [facility];  Service: Podiatry;  Laterality: Right;  . KNEE  ARTHROSCOPY W/ ACL RECONSTRUCTION Right   . TOTAL KNEE ARTHROPLASTY Right 03/28/2015  . TOTAL KNEE ARTHROPLASTY Right 03/28/2015   Procedure: RIGHT TOTAL KNEE ARTHROPLASTY;  Surgeon: [name], MD;  Location: [facility];  Service: Orthopedics;  Laterality: Right;  . WOUND DEBRIDEMENT Right 08/14/2019   Procedure: DEBRIDEMENT WOUND;  Surgeon: Trula Slade, DPM;  Location: [facility];  Service: Podiatry;  Laterality: Right;   Social History   Occupational History  . Occupation: Management consultant  Tobacco Use  . Smoking status: Former Smoker    Packs/day: 0.00    Years: 38.00    Pack years: 0.00    Types: Cigarettes  . Smokeless tobacco: Never Used  . Tobacco comment: QUIT 10/2016  Vaping Use  . Vaping Use: Never used  Substance and Sexual Activity  . Alcohol use: No    Alcohol/week: 5.0 - 6.0 standard drinks    Types: 5 -  6 Cans of beer per week  . Drug use: Yes    Types: Marijuana    Comment: last week ; denies 03/24/17  . Sexual activity: Not on file

## 2019-11-09 ENCOUNTER — Other Ambulatory Visit (HOSPITAL_COMMUNITY): Payer: Self-pay | Admitting: *Deleted

## 2019-11-09 NOTE — Discharge Instructions (Signed)
  Epoetin Alfa injection What is this medicine? EPOETIN ALFA (e POE e tin AL fa) helps your body make more red blood cells. This medicine is used to treat anemia caused by chronic kidney disease, cancer chemotherapy, or HIV-therapy. It may also be used before surgery if you have anemia. This medicine may be used for other purposes; ask your health care provider or pharmacist if you have questions. COMMON BRAND NAME(S): Epogen, Procrit, Retacrit What should I tell my health care provider before I take this medicine? They need to know if you have any of these conditions:  cancer  heart disease  high blood pressure  history of blood clots  history of stroke  low levels of folate, iron, or vitamin B12 in the blood  seizures  an unusual or allergic reaction to erythropoietin, albumin, benzyl alcohol, hamster proteins, other medicines, foods, dyes, or preservatives  pregnant or trying to get pregnant  breast-feeding How should I use this medicine? This medicine is for injection into a vein or under the skin. It is usually given by a health care professional in a hospital or clinic setting. If you get this medicine at home, you will be taught how to prepare and give this medicine. Use exactly as directed. Take your medicine at regular intervals. Do not take your medicine more often than directed. It is important that you put your used needles and syringes in a special sharps container. Do not put them in a trash can. If you do not have a sharps container, call your pharmacist or healthcare provider to get one. A special MedGuide will be given to you by the pharmacist with each prescription and refill. Be sure to read this information carefully each time. Talk to your pediatrician regarding the use of this medicine in children. While this drug may be prescribed for selected conditions, precautions do apply. Overdosage: If you think you have taken too much of this medicine contact a  poison control center or emergency room at once. NOTE: This medicine is only for you. Do not share this medicine with others. What if I miss a dose? If you miss a dose, take it as soon as you can. If it is almost time for your next dose, take only that dose. Do not take double or extra doses. What may interact with this medicine? Interactions have not been studied. This list may not describe all possible interactions. Give your health care provider a list of all the medicines, herbs, non-prescription drugs, or dietary supplements you use. Also tell them if you smoke, drink alcohol, or use illegal drugs. Some items may interact with your medicine. What should I watch for while using this medicine? Your condition will be monitored carefully while you are receiving this medicine. You may need blood work done while you are taking this medicine. This medicine may cause a decrease in vitamin B6. You should make sure that you get enough vitamin B6 while you are taking this medicine. Discuss the foods you eat and the vitamins you take with your health care professional. What side effects may I notice from receiving this medicine? Side effects that you should report to your doctor or health care professional as soon as possible:  allergic reactions like skin rash, itching or hives, swelling of the face, lips, or tongue  seizures  signs and symptoms of a blood clot such as breathing problems; changes in vision; chest pain; severe, sudden headache; pain, swelling, warmth in the leg; trouble speaking; sudden   numbness or weakness of the face, arm or leg  signs and symptoms of a stroke like changes in vision; confusion; trouble speaking or understanding; severe headaches; sudden numbness or weakness of the face, arm or leg; trouble walking; dizziness; loss of balance or coordination Side effects that usually do not require medical attention (report to your doctor or health care professional if they continue  or are bothersome):  chills  cough  dizziness  fever  headaches  joint pain  muscle cramps  muscle pain  nausea, vomiting  pain, redness, or irritation at site where injected This list may not describe all possible side effects. Call your doctor for medical advice about side effects. You may report side effects to FDA at 1-800-FDA-1088. Where should I keep my medicine? Keep out of the reach of children. Store in a refrigerator between 2 and 8 degrees C (36 and 46 degrees F). Do not freeze or shake. Throw away any unused portion if using a single-dose vial. Multi-dose vials can be kept in the refrigerator for up to 21 days after the initial dose. Throw away unused medicine. NOTE: This sheet is a summary. It may not cover all possible information. If you have questions about this medicine, talk to your doctor, pharmacist, or health care provider.  2020 Elsevier/Gold Standard (2016-10-23 08:35:19) Ferumoxytol injection What is this medicine? FERUMOXYTOL is an iron complex. Iron is used to make healthy red blood cells, which carry oxygen and nutrients throughout the body. This medicine is used to treat iron deficiency anemia. This medicine may be used for other purposes; ask your health care provider or pharmacist if you have questions. COMMON BRAND NAME(S): Feraheme What should I tell my health care provider before I take this medicine? They need to know if you have any of these conditions:  anemia not caused by low iron levels  high levels of iron in the blood  magnetic resonance imaging (MRI) test scheduled  an unusual or allergic reaction to iron, other medicines, foods, dyes, or preservatives  pregnant or trying to get pregnant  breast-feeding How should I use this medicine? This medicine is for injection into a vein. It is given by a health care professional in a hospital or clinic setting. Talk to your pediatrician regarding the use of this medicine in children.  Special care may be needed. Overdosage: If you think you have taken too much of this medicine contact a poison control center or emergency room at once. NOTE: This medicine is only for you. Do not share this medicine with others. What if I miss a dose? It is important not to miss your dose. Call your doctor or health care professional if you are unable to keep an appointment. What may interact with this medicine? This medicine may interact with the following medications:  other iron products This list may not describe all possible interactions. Give your health care provider a list of all the medicines, herbs, non-prescription drugs, or dietary supplements you use. Also tell them if you smoke, drink alcohol, or use illegal drugs. Some items may interact with your medicine. What should I watch for while using this medicine? Visit your doctor or healthcare professional regularly. Tell your doctor or healthcare professional if your symptoms do not start to get better or if they get worse. You may need blood work done while you are taking this medicine. You may need to follow a special diet. Talk to your doctor. Foods that contain iron include: whole grains/cereals, dried fruits,   beans, or peas, leafy green vegetables, and organ meats (liver, kidney). What side effects may I notice from receiving this medicine? Side effects that you should report to your doctor or health care professional as soon as possible:  allergic reactions like skin rash, itching or hives, swelling of the face, lips, or tongue  breathing problems  changes in blood pressure  feeling faint or lightheaded, falls  fever or chills  flushing, sweating, or hot feelings  swelling of the ankles or feet Side effects that usually do not require medical attention (report to your doctor or health care professional if they continue or are bothersome):  diarrhea  headache  nausea, vomiting  stomach pain This list may not  describe all possible side effects. Call your doctor for medical advice about side effects. You may report side effects to FDA at 1-800-FDA-1088. Where should I keep my medicine? This drug is given in a hospital or clinic and will not be stored at home. NOTE: This sheet is a summary. It may not cover all possible information. If you have questions about this medicine, talk to your doctor, pharmacist, or health care provider.  2020 Elsevier/Gold Standard (2016-05-04 20:21:10)  

## 2019-11-10 ENCOUNTER — Other Ambulatory Visit: Payer: Self-pay

## 2019-11-10 ENCOUNTER — Encounter (HOSPITAL_BASED_OUTPATIENT_CLINIC_OR_DEPARTMENT_OTHER): Payer: Medicaid Other | Admitting: Internal Medicine

## 2019-11-10 ENCOUNTER — Encounter (HOSPITAL_COMMUNITY)
Admission: RE | Admit: 2019-11-10 | Discharge: 2019-11-10 | Disposition: A | Discharge status: Home / Self Care | Payer: Medicaid Other | Source: Ambulatory Visit | Attending: Nephrology | Admitting: Nephrology

## 2019-11-10 DIAGNOSIS — E11621 Type 2 diabetes mellitus with foot ulcer: Secondary | ICD-10-CM | POA: Diagnosis not present

## 2019-11-10 DIAGNOSIS — N183 Chronic kidney disease, stage 3 unspecified: Secondary | ICD-10-CM | POA: Insufficient documentation

## 2019-11-10 DIAGNOSIS — D631 Anemia in chronic kidney disease: Secondary | ICD-10-CM | POA: Insufficient documentation

## 2019-11-10 MED ORDER — SODIUM CHLORIDE 0.9 % IV SOLN
510.0000 mg | INTRAVENOUS | Status: DC
  Administered 2019-11-10: 510 mg via INTRAVENOUS
  Filled 2019-11-10: qty 17

## 2019-11-10 MED ORDER — EPOETIN ALFA-EPBX 10000 UNIT/ML IJ SOLN: 20000.0000 [IU] | Freq: Once | INTRAMUSCULAR | Status: DC

## 2019-11-10 MED ORDER — EPOETIN ALFA-EPBX 10000 UNIT/ML IJ SOLN
INTRAMUSCULAR | Status: AC
  Administered 2019-11-10: 20000 [IU]
  Filled 2019-11-10: qty 2

## 2019-11-10 NOTE — Progress Notes (Signed)
Pt here for first retacrit injection and Feraheme infusion.  HGB was 6.7 via hemocue.  Repeated to verify with same result. Pt states that he has not seen any blood, has no chest pain and no shortness of breath.  Dr Shelva Majestic office notified and instructed to proceed as planned.  Pt was told to call physician or go to ER if he becomes symptomatic

## 2019-11-10 NOTE — Progress Notes (Signed)
Jeffery [name], Jeffery [name] ([id number]) . Visit Report for 11/10/2019 Arrival Information Details Patient Name: Date of Service: Jeffery [name] [facility] [name]. 11/10/2019 2:00 PM Medical Record Number: [id number] Patient Account Number: [id number] Date of Birth/Sex: Treating RN: [birth year] (59 y.o. [name] Primary Care Ilisa Hayworth: [name] Other Clinician: Referring Audry Pecina: Treating Ciel Chervenak/Extender: Unknown Foley Weeks in Treatment: 2 Visit Information History Since Last Visit Added or deleted any medications: No Patient Arrived: Ambulatory Any new allergies or adverse reactions: No Arrival Time: 14:02 Had a fall or experienced change in No Accompanied By: alone activities of daily living that may affect Transfer Assistance: None risk of falls: Patient Identification Verified: Yes Signs or symptoms of abuse/neglect since last visito No Secondary Verification Process Completed: Yes Hospitalized since last visit: No Patient Requires Transmission-Based Precautions: No Implantable device outside of the clinic excluding No Patient Has Alerts: No cellular tissue based products placed in the center since last visit: Has Dressing in Place as Prescribed: Yes Has Compression in Place as Prescribed: Yes Pain Present Now: No Electronic Signature(s) Signed: 11/10/2019 5:19:54 PM By: [name] RN, BSN Entered By: [name] on 11/10/2019 14:04:23 -------------------------------------------------------------------------------- Compression Therapy Details Patient Name: Date of Service: Jeffery [name] Jeffery [name]. 11/10/2019 2:00 PM Medical Record Number: [id number] Patient Account Number: [id number] Date of Birth/Sex: Treating RN: 1960/08/07 (59 y.o. [name] Primary Care Tylar Merendino: [name] Other Clinician: Referring Domonick Sittner: Treating Nieves Barberi/Extender: Unknown Foley Weeks in Treatment: 2 Compression Therapy Performed for  Wound Assessment: Wound #1 Left,Proximal,Anterior Lower Leg Performed By: Clinician Baruch Gouty, RN Compression Type: Four Layer Post Procedure Diagnosis Same as Pre-procedure Electronic Signature(s) Signed: 11/10/2019 4:53:28 PM By: Kela Millin Entered By: Kela Millin on 11/10/2019 15:15:08 -------------------------------------------------------------------------------- Compression Therapy Details Patient Name: Date of Service: Jeffery [name] Jeffery [name]. 11/10/2019 2:00 PM Medical Record Number: [id number] Patient Account Number: [id number] Date of Birth/Sex: Treating RN: Aug 12, 1960 (59 y.o. [name] Primary Care Chrishun Scheer: [name] Other Clinician: Referring Ronal Maybury: Treating Tenelle Andreason/Extender: Unknown Foley Weeks in Treatment: 2 Compression Therapy Performed for Wound Assessment: Wound #2 Left,Lateral Lower Leg Performed By: Clinician Baruch Gouty, RN Compression Type: Four Layer Post Procedure Diagnosis Same as Pre-procedure Electronic Signature(s) Signed: 11/10/2019 4:53:28 PM By: Kela Millin Entered By: Kela Millin on 11/10/2019 15:15:08 -------------------------------------------------------------------------------- Compression Therapy Details Patient Name: Date of Service: Jeffery [name] Jeffery [name]. 11/10/2019 2:00 PM Medical Record Number: [id number] Patient Account Number: [id number] Date of Birth/Sex: Treating RN: 05-03-1960 (59 y.o. [name] Primary Care Ludie Pavlik: [name] Other Clinician: Referring Jakell Trusty: Treating Othell Jaime/Extender: Unknown Foley Weeks in Treatment: 2 Compression Therapy Performed for Wound Assessment: Wound #5 Right,Medial Lower Leg Performed By: Clinician Baruch Gouty, RN Compression Type: Four Layer Post Procedure Diagnosis Same as Pre-procedure Electronic Signature(s) Signed: 11/10/2019 4:53:28 PM By: Kela Millin Entered By:  Kela Millin on 11/10/2019 15:15:08 -------------------------------------------------------------------------------- Compression Therapy Details Patient Name: Date of Service: Jeffery [name] Jeffery [name]. 11/10/2019 2:00 PM Medical Record Number: [id number] Patient Account Number: [id number] Date of Birth/Sex: Treating RN: Feb 09, 1961 (59 y.o. [name] Primary Care Deveon Kisiel: [name] Other Clinician: Referring Felipe Paluch: Treating Deicy Rusk/Extender: Unknown Foley Weeks in Treatment: 2 Compression Therapy Performed for Wound Assessment: Wound #6 Right,Lateral Lower Leg Performed By: Clinician Baruch Gouty, RN Compression Type: Four Layer Post Procedure Diagnosis Same as Pre-procedure Electronic Signature(s) Signed: 11/10/2019 4:53:28 PM By: Kela Millin Entered By: Kela Millin on 11/10/2019 15:15:08 -------------------------------------------------------------------------------- Compression Therapy Details Patient Name: Date  of Service: Jeffery [name] [name]. 11/10/2019 2:00 PM Medical Record Number: [id number] Patient Account Number: [id number] Date of Birth/Sex: Treating RN: 05-07-1960 (59 y.o. [name] Primary Care Tamiya Colello: [name] Other Clinician: Referring Mary Hockey: Treating Meganne Rita/Extender: Unknown Foley Weeks in Treatment: 2 Compression Therapy Performed for Wound Assessment: Wound #7 Right,Lateral Foot Performed By: Clinician Baruch Gouty, RN Compression Type: Four Layer Post Procedure Diagnosis Same as Pre-procedure Electronic Signature(s) Signed: 11/10/2019 4:53:28 PM By: Kela Millin Entered By: Kela Millin on 11/10/2019 15:15:08 -------------------------------------------------------------------------------- Compression Therapy Details Patient Name: Date of Service: Jeffery [name] Jeffery [name]. 11/10/2019 2:00 PM Medical Record Number: [id number] Patient Account  Number: [id number] Date of Birth/Sex: Treating RN: Nov 19, 1960 (59 y.o. [name] Primary Care Jakiah Goree: [name] Other Clinician: Referring Ikram Riebe: Treating Roberth Berling/Extender: Unknown Foley Weeks in Treatment: 2 Compression Therapy Performed for Wound Assessment: Wound #8 Right,Proximal,Lateral Foot Performed By: Clinician Baruch Gouty, RN Compression Type: Four Layer Post Procedure Diagnosis Same as Pre-procedure Electronic Signature(s) Signed: 11/10/2019 4:53:28 PM By: Kela Millin Entered By: Kela Millin on 11/10/2019 15:15:08 -------------------------------------------------------------------------------- Encounter Discharge Information Details Patient Name: Date of Service: Jeffery [name], Jeffery [name]. 11/10/2019 2:00 PM Medical Record Number: [id number] Patient Account Number: [id number] Date of Birth/Sex: Treating RN: 06/06/1960 (59 y.o. [name] Primary Care Mcdonald Reiling: Other Clinician: [name] Referring Stirling Orton: Treating Samaa Ueda/Extender: Unknown Foley Weeks in Treatment: 2 Encounter Discharge Information Items Discharge Condition: Stable Ambulatory Status: Ambulatory Discharge Destination: Home Transportation: Private Auto Accompanied By: self Schedule Follow-up Appointment: Yes Clinical Summary of Care: Patient Declined Electronic Signature(s) Signed: 11/10/2019 5:26:58 PM By: Baruch Gouty RN, BSN Entered By: Baruch Gouty on 11/10/2019 15:44:38 -------------------------------------------------------------------------------- Lower Extremity Assessment Details Patient Name: Date of Service: Jeffery [name], Jeffery [name]. 11/10/2019 2:00 PM Medical Record Number: [id number] Patient Account Number: [id number] Date of Birth/Sex: Treating RN: May 10, 1960 (59 y.o. [name] Primary Care Danaka Llera: [name] Other Clinician: Referring Jillian Warth: Treating Benetta Maclaren/Extender:  Unknown Foley Weeks in Treatment: 2 Edema Assessment Assessed: [Left: No] [Right: No] Edema: [Left: Yes] [Right: Yes] Calf Left: Right: Point of Measurement: 48 cm From Medial Instep 45.5 cm 40 cm Ankle Left: Right: Point of Measurement: 14 cm From Medial Instep 26.5 cm 23.2 cm Vascular Assessment Pulses: Dorsalis Pedis Palpable: [Left:Yes] [Right:Yes] Electronic Signature(s) Signed: 11/10/2019 5:19:54 PM By: [name] RN, BSN Entered By: [name] on 11/10/2019 14:27:06 -------------------------------------------------------------------------------- [place] Details Patient Name: Date of Service: Jeffery [name], Jeffery [name]. 11/10/2019 2:00 PM Medical Record Number: [id number] Patient Account Number: [id number] Date of Birth/Sex: Treating RN: 1960/12/10 (59 y.o. [name] Primary Care Chamberlain Steinborn: [name] Other Clinician: Referring Macoy Rodwell: Treating Kory Panjwani/Extender: Unknown Foley Weeks in Treatment: 2 Active Inactive Abuse / Safety / Falls / Self Care Management Nursing Diagnoses: Potential for falls Potential for injury related to falls Goals: Patient will remain injury free related to falls Date Initiated: 10/23/2019 Target Resolution Date: 11/24/2019 Goal Status: Active Patient/caregiver will verbalize/demonstrate measures taken to prevent injury and/or falls Date Initiated: 10/23/2019 Target Resolution Date: 11/24/2019 Goal Status: Active Interventions: Assess Activities of Daily Living upon admission and as needed Assess fall risk on admission and as needed Assess: immobility, friction, shearing, incontinence upon admission and as needed Assess impairment of mobility on admission and as needed per policy Assess personal safety and home safety (as indicated) on admission and as needed Assess self care needs on admission and as needed Provide education on fall prevention  Provide  education on personal and home safety Notes: Nutrition Nursing Diagnoses: Impaired glucose control: actual or potential Potential for alteratiion in Nutrition/Potential for imbalanced nutrition Goals: Patient/caregiver agrees to and verbalizes understanding of need to use nutritional supplements and/or vitamins as prescribed Date Initiated: 10/23/2019 Target Resolution Date: 11/24/2019 Goal Status: Active Patient/caregiver will maintain therapeutic glucose control Date Initiated: 10/23/2019 Target Resolution Date: 11/24/2019 Goal Status: Active Interventions: Assess HgA1c results as ordered upon admission and as needed Assess patient nutrition upon admission and as needed per policy Provide education on elevated blood sugars and impact on wound healing Provide education on nutrition Treatment Activities: Education provided on Nutrition : 11/03/2019 Notes: Venous Leg Ulcer Nursing Diagnoses: Knowledge deficit related to disease process and management Potential for venous Insuffiency (use before diagnosis confirmed) Goals: Patient will maintain optimal edema control Date Initiated: 10/23/2019 Target Resolution Date: 11/24/2019 Goal Status: Active Patient/caregiver will verbalize understanding of disease process and disease management Date Initiated: 10/23/2019 Target Resolution Date: 11/24/2019 Goal Status: Active Interventions: Assess peripheral edema status every visit. Compression as ordered Provide education on venous insufficiency Notes: Wound/Skin Impairment Nursing Diagnoses: Impaired tissue integrity Knowledge deficit related to ulceration/compromised skin integrity Goals: Patient/caregiver will verbalize understanding of skin care regimen Date Initiated: 10/23/2019 Target Resolution Date: 11/24/2019 Goal Status: Active Interventions: Assess patient/caregiver ability to obtain necessary supplies Assess patient/caregiver ability to perform ulcer/skin care regimen upon  admission and as needed Assess ulceration(s) every visit Provide education on ulcer and skin care Notes: Electronic Signature(s) Signed: 11/10/2019 4:53:28 PM By: Kela Millin Entered By: Kela Millin on 11/10/2019 13:56:43 -------------------------------------------------------------------------------- Pain Assessment Details Patient Name: Date of Service: Jeffery [name] Jeffery [name]. 11/10/2019 2:00 PM Medical Record Number: [id number] Patient Account Number: [id number] Date of Birth/Sex: Treating RN: [birth year] (59 y.o. [name] Primary Care Whitnee Orzel: [name] Other Clinician: Referring Atharv Barriere: Treating Rumeal Cullipher/Extender: Unknown Foley Weeks in Treatment: 2 Active Problems Location of Pain Severity and Description of Pain Patient Has Paino No Site Locations Pain Management and Medication Current Pain Management: Electronic Signature(s) Signed: 11/10/2019 5:19:54 PM By: [name] RN, BSN Entered By: [name] on 11/10/2019 14:05:06 -------------------------------------------------------------------------------- Patient/Caregiver Education Details Patient Name: Date of Service: Jeffery [name] 8/13/2021andnbsp2:00 PM Medical Record Number: [id number] Patient Account Number: [id number] Date of Birth/Gender: Treating RN: 01-20-1961 (59 y.o. [name] Primary Care Physician: [name] Other Clinician: Referring Physician: Treating Physician/Extender: Unknown Foley Weeks in Treatment: 2 Education Assessment Education Provided To: Patient Education Topics Provided Venous: Handouts: Controlling Swelling with Multilayered Compression Wraps Methods: Explain/Verbal Responses: State content correctly Wound/Skin Impairment: Handouts: Caring for Your Ulcer Methods: Explain/Verbal Responses: State content correctly Electronic Signature(s) Signed: 11/10/2019 4:53:28 PM By: Kela Millin Entered By: Kela Millin on 11/10/2019 13:57:09 -------------------------------------------------------------------------------- Wound Assessment Details Patient Name: Date of Service: Jeffery [name] Jeffery [name]. 11/10/2019 2:00 PM Medical Record Number: [id number] Patient Account Number: [id number] Date of Birth/Sex: Treating RN: 1960-06-05 (59 y.o. [name] Primary Care Jessly Lebeck: [name] Other Clinician: Referring Austina Constantin: Treating Deshanti Adcox/Extender: Unknown Foley Weeks in Treatment: 2 Wound Status Wound Number: 1 Primary Etiology: Diabetic Wound/Ulcer of the Lower Extremity Wound Location: Left, Proximal, Anterior Lower Leg Wound Status: Open Wounding Event: Gradually Appeared Comorbid History: Congestive Heart Failure, Hypertension, Type II Diabetes Date Acquired: 08/09/2019 Weeks Of Treatment: 2 Clustered Wound: No Wound Measurements Length: (cm) 5 Width: (cm) 4 Depth: (cm) 0.1 Area: (cm) 15.708 Volume: (cm) 1.571 % Reduction in Area: -354.5% % Reduction in  Volume: -354% Epithelialization: None Tunneling: No Undermining: No Wound Description Classification: Grade 2 Wound Margin: Flat and Intact Exudate Amount: Medium Exudate Type: Serosanguineous Exudate Color: red, Bonaparte Foul Odor After Cleansing: No Slough/Fibrino No Wound Bed Granulation Amount: Large (67-100%) Exposed Structure Granulation Quality: Pink Fascia Exposed: No Necrotic Amount: None Present (0%) Fat Layer (Subcutaneous Tissue) Exposed: Yes Tendon Exposed: No Muscle Exposed: No Joint Exposed: No Bone Exposed: No Treatment Notes Wound #1 (Left, Proximal, Anterior Lower Leg) 2. Periwound Care Moisturizing lotion 3. Primary Dressing Applied Calcium Alginate Ag 4. Secondary Dressing ABD Pad 6. Support Layer Applied 4 layer compression Water quality scientist) Signed: 11/10/2019 5:19:54 PM By: [name] RN, BSN Entered By: [name] on 11/10/2019 14:24:28 -------------------------------------------------------------------------------- Wound Assessment Details Patient Name: Date of Service: Jeffery [name], Paton [name]. 11/10/2019 2:00 PM Medical Record Number: [id number] Patient Account Number: [id number] Date of Birth/Sex: Treating RN: 1960-05-27 (59 y.o. [name] Primary Care Kasch Borquez: [name] Other Clinician: Referring Katira Dumais: Treating Mialee Weyman/Extender: Unknown Foley Weeks in Treatment: 2 Wound Status Wound Number: 10 Primary Etiology: Diabetic Wound/Ulcer of the Lower Extremity Wound Location: Left [name] Second oe Wound Status: Open Wounding Event: Gradually Appeared Comorbid History: Congestive Heart Failure, Hypertension, Type II Diabetes Date Acquired: 10/23/2019 Weeks Of Treatment: 2 Clustered Wound: No Wound Measurements Length: (cm) 0.6 Width: (cm) 0.5 Depth: (cm) 0.1 Area: (cm) 0.236 Volume: (cm) 0.024 % Reduction in Area: 94% % Reduction in Volume: 93.9% Epithelialization: Small (1-33%) Tunneling: No Undermining: No Wound Description Classification: Grade 2 Wound Margin: Flat and Intact Exudate Amount: Small Exudate Type: Serosanguineous Exudate Color: red, Erno Foul Odor After Cleansing: No Slough/Fibrino No Wound Bed Granulation Amount: Large (67-100%) Exposed Structure Granulation Quality: Pink Fascia Exposed: No Necrotic Amount: None Present (0%) Fat Layer (Subcutaneous Tissue) Exposed: Yes Tendon Exposed: No Muscle Exposed: No Joint Exposed: No Bone Exposed: No Treatment Notes Wound #10 (Left Toe Second) 3. Primary Dressing Applied Calcium Alginate Ag 4. Secondary Dressing Roll Gauze Notes netting Electronic Signature(s) Signed: 11/10/2019 5:19:54 PM By: [name] RN, BSN Entered By: [name] on 11/10/2019 14:24:50 -------------------------------------------------------------------------------- Wound Assessment  Details Patient Name: Date of Service: Jeffery [name], Jeffery [name]. 11/10/2019 2:00 PM Medical Record Number: [id number] Patient Account Number: [id number] Date of Birth/Sex: Treating RN: 08/13/1960 (59 y.o. [name] Primary Care Allysson Rinehimer: [name] Other Clinician: Referring Crandall Harvel: Treating Dannetta Lekas/Extender: Unknown Foley Weeks in Treatment: 2 Wound Status Wound Number: 2 Primary Etiology: Diabetic Wound/Ulcer of the Lower Extremity Wound Location: Left, Lateral Lower Leg Wound Status: Open Wounding Event: Gradually Appeared Comorbid History: Congestive Heart Failure, Hypertension, Type II Diabetes Date Acquired: 08/09/2019 Weeks Of Treatment: 2 Clustered Wound: No Wound Measurements Length: (cm) 1.5 Width: (cm) 1.5 Depth: (cm) 0.1 Area: (cm) 1.767 Volume: (cm) 0.177 % Reduction in Area: -462.7% % Reduction in Volume: -471% Epithelialization: None Tunneling: No Undermining: No Wound Description Classification: Grade 2 Wound Margin: Flat and Intact Exudate Amount: Medium Exudate Type: Serosanguineous Exudate Color: red, Tanori Foul Odor After Cleansing: No Slough/Fibrino Yes Wound Bed Granulation Amount: Large (67-100%) Exposed Structure Granulation Quality: Pink Fascia Exposed: No Necrotic Amount: None Present (0%) Fat Layer (Subcutaneous Tissue) Exposed: Yes Tendon Exposed: No Muscle Exposed: No Joint Exposed: No Bone Exposed: No Treatment Notes Wound #2 (Left, Lateral Lower Leg) 2. Periwound Care Moisturizing lotion 3. Primary Dressing Applied Calcium Alginate Ag 4. Secondary Dressing ABD Pad 6. Support Layer Applied 4 layer compression Water quality scientist) Signed: 11/10/2019 5:19:54 PM By:  [name] RN, BSN Entered By: [name] on 11/10/2019 14:25:04 -------------------------------------------------------------------------------- Wound Assessment Details Patient Name: Date of Service: Jeffery [name]. 11/10/2019 2:00 PM Medical Record Number: [id number] Patient Account Number: [id number] Date of Birth/Sex: Treating RN: [birth year] (58 y.o. [name] Primary Care Lynette Noah: [name] Other Clinician: Referring Zair Borawski: Treating Rolan Wrightsman/Extender: Unknown Foley Weeks in Treatment: 2 Wound Status Wound Number: 5 Primary Etiology: Diabetic Wound/Ulcer of the Lower Extremity Wound Location: Right, Medial Lower Leg Wound Status: Open Wounding Event: Gradually Appeared Comorbid History: Congestive Heart Failure, Hypertension, Type II Diabetes Date Acquired: 08/09/2019 Weeks Of Treatment: 2 Clustered Wound: No Wound Measurements Length: (cm) 1.5 Width: (cm) 5 Depth: (cm) 0.1 Area: (cm) 5.89 Volume: (cm) 0.589 % Reduction in Area: -138.1% % Reduction in Volume: -138.5% Epithelialization: None Tunneling: No Undermining: No Wound Description Classification: Grade 2 Wound Margin: Flat and Intact Exudate Amount: Medium Exudate Type: Serosanguineous Exudate Color: red, Clear Foul Odor After Cleansing: No Slough/Fibrino No Wound Bed Granulation Amount: Large (67-100%) Exposed Structure Granulation Quality: Pink Fascia Exposed: No Necrotic Amount: None Present (0%) Fat Layer (Subcutaneous Tissue) Exposed: Yes Tendon Exposed: No Muscle Exposed: No Joint Exposed: No Bone Exposed: No Treatment Notes Wound #5 (Right, Medial Lower Leg) 2. Periwound Care Moisturizing lotion 3. Primary Dressing Applied Calcium Alginate Ag 4. Secondary Dressing ABD Pad 6. Support Layer Applied 4 layer compression Water quality scientist) Signed: 11/10/2019 5:19:54 PM By: [name] RN, BSN Entered By: [name] on 11/10/2019 14:25:22 -------------------------------------------------------------------------------- Wound Assessment Details Patient Name: Date of Service: Jeffery [name], Jeffery [name]. 11/10/2019 2:00 PM Medical Record Number:  [id number] Patient Account Number: [id number] Date of Birth/Sex: Treating RN: 1960-04-22 (59 y.o. [name] Primary Care Herrick Hartog: [name] Other Clinician: Referring Tobi Groesbeck: Treating Makyle Eslick/Extender: Unknown Foley Weeks in Treatment: 2 Wound Status Wound Number: 6 Primary Etiology: Diabetic Wound/Ulcer of the Lower Extremity Wound Location: Right, Lateral Lower Leg Wound Status: Open Wounding Event: Gradually Appeared Comorbid History: Congestive Heart Failure, Hypertension, Type II Diabetes Date Acquired: 08/09/2019 Weeks Of Treatment: 2 Clustered Wound: Yes Wound Measurements Length: (cm) 1.5 Width: (cm) 1 Depth: (cm) 0.1 Area: (cm) 1.178 Volume: (cm) 0.118 % Reduction in Area: 85% % Reduction in Volume: 85% Epithelialization: Small (1-33%) Tunneling: No Undermining: No Wound Description Classification: Grade 2 Wound Margin: Flat and Intact Exudate Amount: Medium Exudate Type: Serous Exudate Color: amber Foul Odor After Cleansing: No Slough/Fibrino No Wound Bed Granulation Amount: Large (67-100%) Exposed Structure Granulation Quality: Pink Fascia Exposed: No Necrotic Amount: None Present (0%) Fat Layer (Subcutaneous Tissue) Exposed: Yes Tendon Exposed: No Muscle Exposed: No Joint Exposed: No Bone Exposed: No Treatment Notes Wound #6 (Right, Lateral Lower Leg) 2. Periwound Care Moisturizing lotion 3. Primary Dressing Applied Calcium Alginate Ag 4. Secondary Dressing ABD Pad 6. Support Layer Applied 4 layer compression Water quality scientist) Signed: 11/10/2019 5:19:54 PM By: [name] RN, BSN Entered By: [name] on 11/10/2019 14:25:41 -------------------------------------------------------------------------------- Wound Assessment Details Patient Name: Date of Service: Jeffery [name], Jeffery [name]. 11/10/2019 2:00 PM Medical Record Number: [id number] Patient Account Number: [id number] Date of  Birth/Sex: Treating RN: January 07, 1961 (59 y.o. [name] Primary Care Deshawnda Acrey: [name] Other Clinician: Referring Atsushi Yom: Treating Matina Rodier/Extender: Unknown Foley Weeks in Treatment: 2 Wound Status Wound Number: 7 Primary Etiology: Diabetic Wound/Ulcer of the Lower Extremity Wound Location: Right, Lateral Foot Wound Status: Open Wounding Event: Gradually Appeared Comorbid History: Congestive Heart Failure, Hypertension, Type II Diabetes Date  Acquired: 08/02/2019 Weeks Of Treatment: 2 Clustered Wound: No Wound Measurements Length: (cm) 4.4 Width: (cm) 2.6 Depth: (cm) 0.5 Area: (cm) 8.985 Volume: (cm) 4.492 % Reduction in Area: 15.3% % Reduction in Volume: 39.5% Epithelialization: None Tunneling: No Undermining: No Wound Description Classification: Grade 2 Wound Margin: Flat and Intact Exudate Amount: Medium Exudate Type: Serosanguineous Exudate Color: red, Stamour Foul Odor After Cleansing: No Slough/Fibrino Yes Wound Bed Granulation Amount: Medium (34-66%) Exposed Structure Granulation Quality: Red, Pink Fascia Exposed: No Necrotic Amount: Medium (34-66%) Fat Layer (Subcutaneous Tissue) Exposed: Yes Necrotic Quality: Adherent Slough Tendon Exposed: No Muscle Exposed: No Joint Exposed: No Bone Exposed: No Treatment Notes Wound #7 (Right, Lateral Foot) 2. Periwound Care Moisturizing lotion 3. Primary Dressing Applied Calcium Alginate Ag 4. Secondary Dressing ABD Pad 6. Support Layer Applied 4 layer compression Water quality scientist) Signed: 11/10/2019 5:19:54 PM By: [name] RN, BSN Entered By: [name] on 11/10/2019 14:25:55 -------------------------------------------------------------------------------- Wound Assessment Details Patient Name: Date of Service: Jeffery [name], Jeffery [name]. 11/10/2019 2:00 PM Medical Record Number: [id number] Patient Account Number: [id number] Date of Birth/Sex: Treating  RN: 1961/03/16 (59 y.o. [name] Primary Care Beckam Abdulaziz: [name] Other Clinician: Referring Annaleigh Steinmeyer: Treating Akeiba Axelson/Extender: Unknown Foley Weeks in Treatment: 2 Wound Status Wound Number: 8 Primary Etiology: Diabetic Wound/Ulcer of the Lower Extremity Wound Location: Right, Proximal, Lateral Foot Wound Status: Open Wounding Event: Gradually Appeared Comorbid History: Congestive Heart Failure, Hypertension, Type II Diabetes Date Acquired: 08/02/2019 Weeks Of Treatment: 2 Clustered Wound: No Wound Measurements Length: (cm) Width: (cm) Depth: (cm) Area: (cm) Volume: (cm) 0 % Reduction in Area: 100% 0 % Reduction in Volume: 100% 0 Epithelialization: Large (67-100%) 0 Undermining: No 0 Wound Description Classification: Grade 2 Wound Margin: Flat and Intact Exudate Amount: None Present Foul Odor After Cleansing: No Slough/Fibrino No Wound Bed Granulation Amount: None Present (0%) Exposed Structure Necrotic Amount: None Present (0%) Fascia Exposed: No Fat Layer (Subcutaneous Tissue) Exposed: No Tendon Exposed: No Muscle Exposed: No Joint Exposed: No Bone Exposed: No Treatment Notes Wound #8 (Right, Proximal, Lateral Foot) 2. Periwound Care Moisturizing lotion 3. Primary Dressing Applied Calcium Alginate Ag 4. Secondary Dressing ABD Pad 6. Support Layer Applied 4 layer compression Water quality scientist) Signed: 11/10/2019 5:19:54 PM By: [name] RN, BSN Entered By: [name] on 11/10/2019 14:26:12 -------------------------------------------------------------------------------- Vitals Details Patient Name: Date of Service: Jeffery [name], Jeffery [name]. 11/10/2019 2:00 PM Medical Record Number: [id number] Patient Account Number: [id number] Date of Birth/Sex: Treating RN: [birth year] (59 y.o. [name] Primary Care Donalda Job: [name] Other Clinician: Referring Chlora Mcbain: Treating Laurine Kuyper/Extender:  Unknown Foley Weeks in Treatment: 2 Vital Signs Time Taken: 14:04 Temperature (F): 98.1 Height (in): 74 Pulse (bpm): 89 Weight (lbs): 345 Respiratory Rate (breaths/min): 18 Body Mass Index (BMI): 44.3 Blood Pressure (mmHg): 144/80 Capillary Blood Glucose (mg/dl): 119 Reference Range: 80 - 120 mg / dl Notes glucose per pt report Electronic Signature(s) Signed: 11/10/2019 5:19:54 PM By: [name] RN, BSN Entered By: [name] on 11/10/2019 14:04:48

## 2019-11-10 NOTE — Progress Notes (Signed)
DEREN, DEGRAZIA [name] ([id number]) . Visit Report for 11/10/2019 HPI Details Patient Name: Date of Service: Jeffery [name] [facility] [name]. 11/10/2019 2:00 PM Medical Record Number: [id number] Patient Account Number: [id number] Date of Birth/Sex: Treating RN: 1960/06/23 (59 y.o. [name] Primary Care Provider: [name] Other Clinician: Referring Provider: Treating Provider/Extender: Unknown Foley Weeks in Treatment: 2 History of Present Illness HPI Description: ADMISSION 10/23/2019 This is a complex 59 year old man who is here with wounds on his bilateral lower legs feet and abrasion on his left forearm. These are generally of different etiology. He is a type II diabetic with peripheral neuropathy but does not have known PAD. Looking through [facility] health link I can see he was being followed by Dr. Earleen Newport earlier this year for a diabetic ulcer on the right foot. An MRI done in May showed no osteomyelitis. Shortly thereafter he developed cellulitis of the right leg and foot and was hospitalized from 5/10 through 5/19 with MSSA sepsis. During this hospitalization he was felt to have osteomyelitis he had debridement twice of the right foot and on one occasion apparently a placement of ACell. He again was hospitalized from 6/10 through 7/1 again with left lower extremity cellulitis increasing edema and acute renal failure. An MRI showed left leg cellulitis but no other major findings. 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 He was discharged with multiple wounds on his legs which apparently started as blisters secondary to fluid overload. His creatinine on 7/5 was 7.49 potassium 4 BUN 116 CO2 of 23 albumin of 2.9. He was seen by Dr. Moshe Cipro of nephrology medications were adjusted she is following him. 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 The patient arrives in clinic today  with multiple wounds of different etiologies on his bilateral lower extremities. On the right leg he has superficial areas on the right medial and lateral.. On his right lateral foot I think the original surgical wound. There is a small area at the base of his left second toe. On the left lower extremity a fairly large wound on the left second toe which was trauma from the shower door. He has several areas on the legs. Which are superficial. Finally he has an abrasion injury on his left arm/skin tear Past medical history includes Charcot foot. Left third toe amputation, type 2 diabetes with peripheral neuropathy, paroxysmal A. fib on Eliquis, diastolic heart failure, acute on chronic renal failure as described. ABI in our clinic was 1.18 on the right 1.19 on the left 11/03/2019 upon evaluation today patient appears to be doing well with regard to his ulcers in general. He is making good progress. The worst is his larger right lateral foot ulcer. This wound is going require some sharp debridement today. Other than that he seems to be doing quite well. 11/10/19-Patient has denuded areas on both legs especially the right where there is more stasis changes and friable skin, areas on his right leg appear to be bigger than before, right lateral foot wound appears about the same. We are using Hydrofera Blue to the foot wound and silver alginate on the rest with 4 layer compression Electronic Signature(s) Signed: 11/10/2019 3:18:01 PM By: Tobi Bastos MD, MBA Entered By: Tobi Bastos on 11/10/2019 15:18:01 -------------------------------------------------------------------------------- Physical Exam Details Patient Name: Date of Service: Jeffery Porter [name]. 11/10/2019 2:00 PM Medical Record Number: [id number] Patient Account Number: [id number] Date of Birth/Sex: Treating  RN: 1960-10-14 (59 y.o. [name] Primary Care Provider: [name] Other Clinician: Referring  Provider: Treating Provider/Extender: Unknown Foley Weeks in Treatment: 2 Constitutional alert and oriented x 3. sitting or standing blood pressure is within target range for patient.. supine blood pressure is within target range for patient.. pulse regular and within target range for patient.Jeffery [name] respirations regular, non-labored and within target range for patient.Jeffery [name] temperature within target range for patient.. . . Well- nourished and well-hydrated in no acute distress. Notes Right lateral foot wound with some hyper granulation noted otherwise healthy base, right leg open denuded areas surrounded by friable skin with a couple of blebs, left lower extremity with an open area Electronic Signature(s) Signed: 11/10/2019 3:18:39 PM By: Tobi Bastos MD, MBA Entered By: Tobi Bastos on 11/10/2019 15:18:38 -------------------------------------------------------------------------------- Physician Orders Details Patient Name: Date of Service: Jeffery Raring, [facility] [name]. 11/10/2019 2:00 PM Medical Record Number: [id number] Patient Account Number: [id number] Date of Birth/Sex: Treating RN: 03/29/1961 (59 y.o. [name] Primary Care Provider: [name] Other Clinician: Referring Provider: Treating Provider/Extender: Unknown Foley Weeks in Treatment: 2 Verbal / Phone Orders: No Diagnosis Coding ICD-10 Coding Code Description E11.621 Type 2 diabetes mellitus with foot ulcer T81.31XA Disruption of external operation (surgical) wound, not elsewhere classified, initial encounter L97.811 Non-pressure chronic ulcer of other part of right lower leg limited to breakdown of skin L97.821 Non-pressure chronic ulcer of other part of left lower leg limited to breakdown of skin S90.812D Abrasion, left foot, subsequent encounter S50.812D Abrasion of left forearm, subsequent encounter E11.22 Type 2 diabetes mellitus with diabetic chronic kidney  disease L97.512 Non-pressure chronic ulcer of other part of right foot with fat layer exposed Follow-up Appointments ppointment in 1 week. - Friday - ****EXTRA TIME - 32 MINUTES**** Return A Dressing Change Frequency Change dressing three times week. - all wounds (wound clinic to change on Friday, home health to change on Monday and Wednesday) Skin Barriers/Peri-Wound Care Moisturizing lotion - both legs Wound Cleansing Clean wound with Normal Saline. - or wound cleanser Primary Wound Dressing Wound #1 Left,Proximal,Anterior Lower Leg Calcium Alginate with Silver Wound #10 Left [name] Second oe Calcium Alginate with Silver Wound #2 Left,Lateral Lower Leg Calcium Alginate with Silver Wound #5 Right,Medial Lower Leg Calcium Alginate with Silver Wound #6 Right,Lateral Lower Leg Calcium Alginate with Silver Wound #7 Right,Lateral Foot Hydrofera Blue Wound #8 Right,Proximal,Lateral Foot Calcium Alginate with Silver Secondary Dressing Dry Gauze - all wounds ABD pad - all wounds on legs Wound #10 Left [name] Second oe Kerlix/Rolled Gauze Dry Gauze Edema Control 4 layer compression - Bilateral - unna boot to top to anchor Avoid standing for long periods of time Elevate legs to the level of the heart or above for 30 minutes daily and/or when sitting, a frequency of: - throughout the day Exercise regularly [place] skilled nursing for wound care. - [facility] Electronic Signature(s) Signed: 11/10/2019 4:53:28 PM By: Kela Millin Signed: 11/10/2019 4:53:36 PM By: Tobi Bastos MD, MBA Entered By: Kela Millin on 11/10/2019 13:56:34 -------------------------------------------------------------------------------- Problem List Details Patient Name: Date of Service: Jeffery Raring, [place] [name]. 11/10/2019 2:00 PM Medical Record Number: [id number] Patient Account Number: [id number] Date of Birth/Sex: Treating RN: 22-Jul-1960 (59 y.o. [name] Primary  Care Provider: [name] Other Clinician: Referring Provider: Treating Provider/Extender: Unknown Foley Weeks in Treatment: 2 Active Problems ICD-10 Encounter Code Description Active Date MDM Diagnosis E11.621 Type 2 diabetes mellitus with foot ulcer  10/23/2019 No Yes T81.31XA Disruption of external operation (surgical) wound, not elsewhere classified, 10/23/2019 No Yes initial encounter L97.811 Non-pressure chronic ulcer of other part of right lower leg limited to breakdown 10/23/2019 No Yes of skin L97.821 Non-pressure chronic ulcer of other part of left lower leg limited to breakdown 10/23/2019 No Yes of skin S90.812D Abrasion, left foot, subsequent encounter 10/23/2019 No Yes S50.812D Abrasion of left forearm, subsequent encounter 10/23/2019 No Yes E11.22 Type 2 diabetes mellitus with diabetic chronic kidney disease 10/23/2019 No Yes L97.512 Non-pressure chronic ulcer of other part of right foot with fat layer exposed 11/03/2019 No Yes Inactive Problems Resolved Problems Electronic Signature(s) Signed: 11/10/2019 4:53:28 PM By: Kela Millin Signed: 11/10/2019 4:53:36 PM By: Tobi Bastos MD, MBA Entered By: Kela Millin on 11/10/2019 15:14:28 -------------------------------------------------------------------------------- Progress Note Details Patient Name: Date of Service: Jeffery Raring, [place] [name]. 11/10/2019 2:00 PM Medical Record Number: [id number] Patient Account Number: [id number] Date of Birth/Sex: Treating RN: [birth year] (59 y.o. [name] Primary Care Provider: [name] Other Clinician: Referring Provider: Treating Provider/Extender: Unknown Foley Weeks in Treatment: 2 Subjective History of Present Illness (HPI) ADMISSION 10/23/2019 This is a complex 59 year old man who is here with wounds on his bilateral lower legs feet and abrasion on his left forearm. These are generally of different etiology. He is a  type II diabetic with peripheral neuropathy but does not have known PAD. Looking through [facility] health link I can see he was being followed by Dr. Earleen Newport earlier this year for a diabetic ulcer on the right foot. An MRI done in May showed no osteomyelitis. Shortly thereafter he developed cellulitis of the right leg and foot and was hospitalized from 5/10 through 5/19 with MSSA sepsis. During this hospitalization he was felt to have osteomyelitis he had debridement twice of the right foot and on one occasion apparently a placement of ACell. He again was hospitalized from 6/10 through 7/1 again with left lower extremity cellulitis increasing edema and acute renal failure. An MRI showed left leg cellulitis but no other major findings. 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 He was discharged with multiple wounds on his legs which apparently started as blisters secondary to fluid overload. His creatinine on 7/5 was 7.49 potassium 4 BUN 116 CO2 of 23 albumin of 2.9. He was seen by Dr. Moshe Cipro of nephrology medications were adjusted she is following him. 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 The patient arrives in clinic today with multiple wounds of different etiologies on his bilateral lower extremities. ooOn the right leg he has superficial areas on the right medial and lateral.. On his right lateral foot I think the original surgical wound. There is a small area at the base of his left second toe. ooOn the left lower extremity a fairly large wound on the left second toe which was trauma from the shower door. He has several areas on the legs. Which are superficial. ooFinally he has an abrasion injury on his left arm/skin tear Past medical history includes Charcot foot. Left third toe amputation, type 2 diabetes with peripheral neuropathy, paroxysmal A. fib on Eliquis, diastolic heart failure, acute on  chronic renal failure as described. ABI in our clinic was 1.18 on the right 1.19 on the left 11/03/2019 upon evaluation today patient appears to be doing well with regard to his ulcers in general. He is making good progress. The worst is his  larger right lateral foot ulcer. This wound is going require some sharp debridement today. Other than that he seems to be doing quite well. 11/10/19-Patient has denuded areas on both legs especially the right where there is more stasis changes and friable skin, areas on his right leg appear to be bigger than before, right lateral foot wound appears about the same. We are using Hydrofera Blue to the foot wound and silver alginate on the rest with 4 layer compression Objective Constitutional alert and oriented x 3. sitting or standing blood pressure is within target range for patient.. supine blood pressure is within target range for patient.. pulse regular and within target range for patient.Jeffery [name] respirations regular, non-labored and within target range for patient.Jeffery [name] temperature within target range for patient.. Well- nourished and well-hydrated in no acute distress. Vitals Time Taken: 2:04 PM, Height: 74 in, Weight: 345 lbs, BMI: 44.3, Temperature: 98.1 F, Pulse: 89 bpm, Respiratory Rate: 18 breaths/min, Blood Pressure: 144/80 mmHg, Capillary Blood Glucose: 119 mg/dl. General Notes: glucose per pt report General Notes: Right lateral foot wound with some hyper granulation noted otherwise healthy base, right leg open denuded areas surrounded by friable skin with a couple of blebs, left lower extremity with an open area Integumentary (Hair, Skin) Wound #1 status is Open. Original cause of wound was Gradually Appeared. The wound is located on the Left,Proximal,Anterior Lower Leg. The wound measures 5cm length x 4cm width x 0.1cm depth; 15.708cm^2 area and 1.571cm^3 volume. There is Fat Layer (Subcutaneous Tissue) Exposed exposed. There is no tunneling or undermining  noted. There is a medium amount of serosanguineous drainage noted. The wound margin is flat and intact. There is large (67- 100%) pink granulation within the wound bed. There is no necrotic tissue within the wound bed. Wound #10 status is Open. Original cause of wound was Gradually Appeared. The wound is located on the Left [name] Second. The wound measures 0.6cm length oe x 0.5cm width x 0.1cm depth; 0.236cm^2 area and 0.024cm^3 volume. There is Fat Layer (Subcutaneous Tissue) Exposed exposed. There is no tunneling or undermining noted. There is a small amount of serosanguineous drainage noted. The wound margin is flat and intact. There is large (67-100%) pink granulation within the wound bed. There is no necrotic tissue within the wound bed. Wound #2 status is Open. Original cause of wound was Gradually Appeared. The wound is located on the Left,Lateral Lower Leg. The wound measures 1.5cm length x 1.5cm width x 0.1cm depth; 1.767cm^2 area and 0.177cm^3 volume. There is Fat Layer (Subcutaneous Tissue) Exposed exposed. There is no tunneling or undermining noted. There is a medium amount of serosanguineous drainage noted. The wound margin is flat and intact. There is large (67-100%) pink granulation within the wound bed. There is no necrotic tissue within the wound bed. Wound #5 status is Open. Original cause of wound was Gradually Appeared. The wound is located on the Right,Medial Lower Leg. The wound measures 1.5cm length x 5cm width x 0.1cm depth; 5.89cm^2 area and 0.589cm^3 volume. There is Fat Layer (Subcutaneous Tissue) Exposed exposed. There is no tunneling or undermining noted. There is a medium amount of serosanguineous drainage noted. The wound margin is flat and intact. There is large (67-100%) pink granulation within the wound bed. There is no necrotic tissue within the wound bed. Wound #6 status is Open. Original cause of wound was Gradually Appeared. The wound is located on the Right,Lateral  Lower Leg. The wound measures 1.5cm length x 1cm width x 0.1cm depth; 1.178cm^2  area and 0.118cm^3 volume. There is Fat Layer (Subcutaneous Tissue) Exposed exposed. There is no tunneling or undermining noted. There is a medium amount of serous drainage noted. The wound margin is flat and intact. There is large (67-100%) pink granulation within the wound bed. There is no necrotic tissue within the wound bed. Wound #7 status is Open. Original cause of wound was Gradually Appeared. The wound is located on the Right,Lateral Foot. The wound measures 4.4cm length x 2.6cm width x 0.5cm depth; 8.985cm^2 area and 4.492cm^3 volume. There is Fat Layer (Subcutaneous Tissue) Exposed exposed. There is no tunneling or undermining noted. There is a medium amount of serosanguineous drainage noted. The wound margin is flat and intact. There is medium (34-66%) red, pink granulation within the wound bed. There is a medium (34-66%) amount of necrotic tissue within the wound bed including Adherent Slough. Wound #8 status is Open. Original cause of wound was Gradually Appeared. The wound is located on the Right,Proximal,Lateral Foot. The wound measures 0cm length x 0cm width x 0cm depth; 0cm^2 area and 0cm^3 volume. There is no undermining noted. There is a none present amount of drainage noted. The wound margin is flat and intact. There is no granulation within the wound bed. There is no necrotic tissue within the wound bed. 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 There was a Four Layer Compression Therapy Procedure by Baruch Gouty, RN. Post procedure Diagnosis Wound #1: Same as Pre-Procedure Wound #2 Pre-procedure diagnosis of Wound #2 is a Diabetic Wound/Ulcer of the Lower Extremity located on the Left,Lateral Lower Leg . There was a Four Layer Compression Therapy Procedure by Baruch Gouty, RN. Post procedure Diagnosis Wound #2: Same as Pre-Procedure Wound #5 Pre-procedure diagnosis of Wound #5 is a Diabetic Wound/Ulcer of the Lower Extremity located on the Right,Medial Lower Leg . There was a Four Layer Compression Therapy Procedure by Baruch Gouty, RN. Post procedure Diagnosis Wound #5: Same as Pre-Procedure Wound #6 Pre-procedure diagnosis of Wound #6 is a Diabetic Wound/Ulcer of the Lower Extremity located on the Right,Lateral Lower Leg . There was a Four Layer Compression Therapy Procedure by Baruch Gouty, RN. Post procedure Diagnosis Wound #6: Same as Pre-Procedure Wound #7 Pre-procedure diagnosis of Wound #7 is a Diabetic Wound/Ulcer of the Lower Extremity located on the Right,Lateral Foot . There was a Four Layer Compression Therapy Procedure by Baruch Gouty, RN. Post procedure Diagnosis Wound #7: Same as Pre-Procedure Wound #8 Pre-procedure diagnosis of Wound #8 is a Diabetic Wound/Ulcer of the Lower Extremity located on the Right,Proximal,Lateral Foot . There was a Four Layer Compression Therapy Procedure by Baruch Gouty, RN. Post procedure Diagnosis Wound #8: Same as Pre-Procedure Plan Follow-up Appointments: Return Appointment in 1 week. - Friday - ****EXTRA TIME - 75 MINUTES**** Dressing Change Frequency: Change dressing three times week. - all wounds (wound clinic to change  on Friday, home health to change on Monday and  Wednesday) Skin Barriers/Peri-Wound Care: Moisturizing lotion - both legs Wound Cleansing: Clean wound with Normal Saline. - or wound cleanser Primary Wound Dressing: Wound #1 Left,Proximal,Anterior Lower Leg: Calcium Alginate with Silver Wound #10 Left [name] Second: oe Calcium Alginate with Silver Wound #2 Left,Lateral Lower Leg: Calcium Alginate with Silver Wound #5 Right,Medial Lower Leg: Calcium Alginate with Silver Wound #6 Right,Lateral Lower Leg: Calcium Alginate with Silver Wound #7 Right,Lateral Foot: Hydrofera Blue Wound #8 Right,Proximal,Lateral Foot: Calcium Alginate with Silver Secondary Dressing: Dry Gauze - all wounds ABD pad - all wounds on legs Wound #10 Left [name] Second: oe Kerlix/Rolled Gauze Dry Gauze Edema Control: 4 layer compression - Bilateral - unna boot to top to anchor Avoid standing for long periods of time Elevate legs to the level of the heart or above for 30 minutes daily and/or when sitting, a frequency of: - throughout the day Exercise regularly Home Health: [place] skilled nursing for wound care. - [facility] -Continue with silver alginate to the leg wounds, with 4 layer compression wrap, changed with home health at home -Continue with Hydrofera Blue to the right lateral foot wound -Return to clinic next week Electronic Signature(s) Signed: 11/10/2019 3:19:23 PM By: Tobi Bastos MD, MBA Entered By: Tobi Bastos on 11/10/2019 15:19:23 -------------------------------------------------------------------------------- SuperBill Details Patient Name: Date of Service: Jeffery Raring, CHRISTO [facility]ER [name]. 11/10/2019 Medical Record Number: [id number] Patient Account Number: [id number] Date of Birth/Sex: Treating RN: Aug 09, 1960 (59 y.o. [name] Primary Care Provider: [name] Other Clinician: Referring Provider: Treating Provider/Extender: Unknown Foley Weeks in  Treatment: 2 Diagnosis Coding ICD-10 Codes Code Description [phone] Type 2 diabetes mellitus with foot ulcer T81.31XA Disruption of external operation (surgical) wound, not elsewhere classified, initial encounter L97.811 Non-pressure chronic ulcer of other part of right lower leg limited to breakdown of skin L97.821 Non-pressure chronic ulcer of other part of left lower leg limited to breakdown of skin S90.812D Abrasion, left foot, subsequent encounter S50.812D Abrasion of left forearm, subsequent encounter 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 Facility Procedures CPT4: Code [id number] Description: 81 BILATERAL: Application of multi-layer venous compression system; leg (below knee), including ankle and [name]. Modifier: Quantity: 1 Physician Procedures : CPT4 Code Description Modifier [id number] - WC PHYS LEVEL 3 - EST PT ICD-10 Diagnosis Description E11.621 Type 2 diabetes mellitus with foot ulcer Quantity: 1 Electronic Signature(s) Signed: 11/10/2019 3:19:43 PM By: Tobi Bastos MD, MBA Entered By: Tobi Bastos on 11/10/2019 15:19:42

## 2019-11-13 ENCOUNTER — Telehealth: Payer: Self-pay | Admitting: Cardiovascular Disease

## 2019-11-13 DIAGNOSIS — D631 Anemia in chronic kidney disease: Secondary | ICD-10-CM | POA: Diagnosis not present

## 2019-11-13 DIAGNOSIS — N1831 Chronic kidney disease, stage 3a: Secondary | ICD-10-CM | POA: Diagnosis not present

## 2019-11-13 DIAGNOSIS — E1122 Type 2 diabetes mellitus with diabetic chronic kidney disease: Secondary | ICD-10-CM | POA: Diagnosis not present

## 2019-11-13 DIAGNOSIS — I129 Hypertensive chronic kidney disease with stage 1 through stage 4 chronic kidney disease, or unspecified chronic kidney disease: Secondary | ICD-10-CM | POA: Diagnosis not present

## 2019-11-13 DIAGNOSIS — N179 Acute kidney failure, unspecified: Secondary | ICD-10-CM | POA: Diagnosis not present

## 2019-11-13 LAB — POCT HEMOGLOBIN-HEMACUE: Hemoglobin: 6.7 g/dL — CL (ref 13.0–17.0)

## 2019-11-13 NOTE — Telephone Encounter (Signed)
Noted recent hospital admission for acute kidney injury and Rx for apixaban sent by PCP (Dr Margarita Rana).   Okay to adjust eliquis to 2.5mg  BID during acute kidney injury as ordered by "kidney doctor"

## 2019-11-13 NOTE — Telephone Encounter (Signed)
Will route to pharmd pool 

## 2019-11-13 NOTE — Telephone Encounter (Signed)
Pt c/o medication issue:  1. Name of Medication: apixaban (ELIQUIS) 5 MG TABS tablet  2. How are you currently taking this medication (dosage and times per day)? As directed  3. Are you having a reaction (difficulty breathing--STAT)? no  4. What is your medication issue? Patient states that his kidney doctor would like him to reduce this medication to 2.5mg  2x daily. Please advise.

## 2019-11-14 NOTE — Telephone Encounter (Signed)
Spoke to patient Jeffery [name]'s advice given.Stated his kidney Dr.already sent Eliquis 2.5 mg prescription in.Advised I will make Dr.Kelly aware.

## 2019-11-16 ENCOUNTER — Encounter (HOSPITAL_BASED_OUTPATIENT_CLINIC_OR_DEPARTMENT_OTHER): Payer: Medicaid Other | Admitting: Internal Medicine

## 2019-11-16 DIAGNOSIS — L97512 Non-pressure chronic ulcer of other part of right foot with fat layer exposed: Secondary | ICD-10-CM | POA: Diagnosis not present

## 2019-11-16 DIAGNOSIS — E11621 Type 2 diabetes mellitus with foot ulcer: Secondary | ICD-10-CM | POA: Diagnosis not present

## 2019-11-16 NOTE — Progress Notes (Signed)
Jeffery [name] ([id number]) . Visit Report for 11/16/2019 Arrival Information Details Patient Name: Date of Service: Jeffery [name]. 11/16/2019 3:45 PM Medical Record Number: [id number] Patient Account Number: [id number] Date of Birth/Sex: Treating RN: March 24, 1961 (59 y.o. Jeffery [name] Primary Care Jeffery [name] Other Clinician: Referring Jeffery [name]: Treating Jeffery [name]/Extender: Jeffery [name] Weeks in Treatment: 3 Visit Information History Since Last Visit Added or deleted any medications: No Patient Arrived: Ambulatory Any new allergies or adverse reactions: No Arrival Time: 16:00 Had a fall or experienced change in No Accompanied By: self activities of daily living that may affect Transfer Assistance: None risk of falls: Patient Identification Verified: Yes Signs or symptoms of abuse/neglect since last visito No Secondary Verification Process Completed: Yes Hospitalized since last visit: No Patient Requires Transmission-Based Precautions: No Implantable device outside of the clinic excluding No Patient Has Alerts: No cellular tissue based products placed in the center since last visit: Has Dressing in Place as Prescribed: Yes Has Compression in Place as Prescribed: Yes Pain Present Now: No Electronic Signature(s) Signed: 11/16/2019 5:22:44 PM By: Kela Millin Entered By: Kela Millin on 11/16/2019 16:05:14 -------------------------------------------------------------------------------- Compression Therapy Details Patient Name: Date of Service: Jeffery [name]. 11/16/2019 3:45 PM Medical Record Number: [id number] Patient Account Number: [id number] Date of Birth/Sex: Treating RN: [birth year] (59 y.o. Jeffery [name] Primary Care Jeffery [name]: Jeffery [name] Other Clinician: Referring Elfa Wooton: Treating Jeffery [name]/Extender: Jeffery [name] Weeks in Treatment: 3 Compression Therapy Performed for Wound  Assessment: Wound #2 Left,Lateral Lower Leg Performed By: Clinician Jeffery Hurst, RN Compression Type: Four Layer Post Procedure Diagnosis Same as Pre-procedure Electronic Signature(s) Signed: 11/16/2019 5:36:57 PM By: Jeffery Hurst RN, BSN Entered By: Jeffery [name] on 11/16/2019 16:53:52 -------------------------------------------------------------------------------- Compression Therapy Details Patient Name: Date of Service: Jeffery [name]. 11/16/2019 3:45 PM Medical Record Number: [id number] Patient Account Number: [id number] Date of Birth/Sex: Treating RN: [birth year] (59 y.o. Jeffery [name] Primary Care Jeffery [name]: Jeffery [name] Other Clinician: Referring Jeffery [name]: Treating Jeffery [name]/Extender: Jeffery [name] Weeks in Treatment: 3 Compression Therapy Performed for Wound Assessment: Wound #5 Right,Medial Lower Leg Performed By: Clinician Jeffery Hurst, RN Compression Type: Four Layer Post Procedure Diagnosis Same as Pre-procedure Electronic Signature(s) Signed: 11/16/2019 5:36:57 PM By: Jeffery Hurst RN, BSN Entered By: Jeffery [name] on 11/16/2019 16:53:53 -------------------------------------------------------------------------------- Compression Therapy Details Patient Name: Date of Service: Jeffery [name]. 11/16/2019 3:45 PM Medical Record Number: [id number] Patient Account Number: [id number] Date of Birth/Sex: Treating RN: 13-Mar-1961 (59 y.o. Jeffery [name] Primary Care Jeffery [name]: Jeffery [name] Other Clinician: Referring Jeffery [name]: Treating Jeffery [name]/Extender: Jeffery [name] Weeks in Treatment: 3 Compression Therapy Performed for Wound Assessment: Wound #7 Right,Lateral Foot Performed By: Clinician Jeffery Hurst, RN Compression Type: Four Layer Post Procedure Diagnosis Same as Pre-procedure Electronic Signature(s) Signed: 11/16/2019 5:36:57 PM By: Jeffery Hurst RN, BSN Entered By: Jeffery [name] on  11/16/2019 16:53:53 -------------------------------------------------------------------------------- Encounter Discharge Information Details Patient Name: Date of Service: Jeffery [name], Jeffery [name]. 11/16/2019 3:45 PM Medical Record Number: [id number] Patient Account Number: [id number] Date of Birth/Sex: Treating RN: 1960/08/14 (59 y.o. Jeffery [name] Primary Care Jeffery [name]: Jeffery [name] Other Clinician: Referring Jeffery [name]: Treating Jeffery [name]/Extender: Jeffery [name] in Treatment: 3 Encounter Discharge Information Items Post Procedure Vitals Discharge Condition: Stable Temperature (F): 98.3 Ambulatory Status: Ambulatory Pulse (bpm): 92 Discharge Destination: Home Respiratory Rate (breaths/min): 18 Transportation: Private Auto Blood Pressure (mmHg): 129/74 Accompanied By: self Schedule Follow-up Appointment: Yes Clinical Summary of Care: Patient Declined  Electronic Signature(s) Signed: 11/16/2019 5:37:54 PM By: Baruch Gouty RN, BSN Entered By: Baruch Gouty on 11/16/2019 17:33:33 -------------------------------------------------------------------------------- Lower Extremity Assessment Details Patient Name: Date of Service: Jeffery [name], Jeffery [name]. 11/16/2019 3:45 PM Medical Record Number: [id number] Patient Account Number: [id number] Date of Birth/Sex: Treating RN: Nov 30, 1960 (59 y.o. Jeffery [name] Primary Care Jeffery [name]: Jeffery [name] Other Clinician: Referring Jeffery [name]: Treating Jeffery [name]/Extender: Jeffery [name] Weeks in Treatment: 3 Edema Assessment Assessed: [Left: No] [Right: No] Edema: [Left: Yes] [Right: Yes] Calf Left: Right: Point of Measurement: 48 cm From Medial Instep 45.5 cm 41.5 cm Ankle Left: Right: Point of Measurement: 14 cm From Medial Instep 27.7 cm 25.5 cm Vascular Assessment Pulses: Dorsalis Pedis Palpable: [Left:Yes] [Right:Yes] Electronic Signature(s) Signed: 11/16/2019 5:22:44 PM By:  Kela Millin Entered By: Kela Millin on 11/16/2019 16:15:23 -------------------------------------------------------------------------------- Multi Wound Chart Details Patient Name: Date of Service: Jeffery [name], Jeffery [name]. 11/16/2019 3:45 PM Medical Record Number: [id number] Patient Account Number: [id number] Date of Birth/Sex: Treating RN: [birth year] (59 y.o. Jeffery [name] Primary Care Kareema Keitt: Jeffery [name] Other Clinician: Referring Ezekiah Massie: Treating Minaal Struckman/Extender: Jeffery [name] Weeks in Treatment: 3 Vital Signs Height(in): 74 Capillary Blood Glucose(mg/dl): 129 Weight(lbs): 345 Pulse(bpm): 92 Body Mass Index(BMI): 38 Blood Pressure(mmHg): 129/74 Temperature(F): 98.3 Respiratory Rate(breaths/min): 19 Photos: [1:No Photos Left, Proximal, Anterior Lower Leg] [10:No 14 Left [name] Second oe] [2:No Photos Left, Lateral Lower Leg] Wound Location: [1:Gradually Appeared] [10:Gradually Appeared] [2:Gradually Appeared] Wounding Event: [1:Diabetic Wound/Ulcer of the Lower] [10:Diabetic Wound/Ulcer of the Lower] [2:Diabetic Wound/Ulcer of the Lower] Primary Etiology: [1:Extremity Congestive Heart Failure,] [10:Extremity Congestive Heart Failure,] [2:Extremity Congestive Heart Failure,] Comorbid History: [1:Hypertension, Type II Diabetes 08/09/2019] [10:Hypertension, Type II Diabetes 10/23/2019] [2:Hypertension, Type II Diabetes 08/09/2019] Date A cquired: [1:3] [10:3] [2:3] Weeks of Treatment: [1:Healed - Epithelialized] [10:Healed - Epithelialized] [2:Open] Wound Status: [1:No] [10:No] [2:No] Clustered Wound: [1:0x0x0] [10:0x0x0] [2:0.5x0.5x0.1] Measurements L x W x D (cm) [1:0] [10:0] [2:0.196] A (cm) : rea [1:0] [10:0] [2:0.02] Volume (cm) : [1:100.00%] [10:100.00%] [2:37.60%] % Reduction in A [1:rea: 100.00%] [10:100.00%] [2:35.50%] % Reduction in Volume: [1:Grade 2] [10:Grade 2] [2:Grade 2] Classification: [1:None Present] [10:None Present]  [2:Medium] Exudate A mount: [1:N/A] [10:N/A] [2:Serous] Exudate Type: [1:N/A] [10:N/A] [2:amber] Exudate Color: [1:Distinct, outline attached] [10:Distinct, outline attached] [2:Flat and Intact] Wound Margin: [1:None Present (0%)] [10:None Present (0%)] [2:Large (67-100%)] Granulation A mount: [1:N/A] [10:N/A] [2:Pink] Granulation Quality: [1:None Present (0%)] [10:None Present (0%)] [2:None Present (0%)] Necrotic A mount: [1:Fascia: No] [10:Fascia: No] [2:Fat Layer (Subcutaneous Tissue)] Exposed Structures: [1:Fat Layer (Subcutaneous Tissue) Exposed: No Tendon: No Muscle: No Joint: No Bone: No Large (67-100%)] [10:Fat Layer (Subcutaneous Tissue) Exposed: No Tendon: No Muscle: No Joint: No Bone: No Large (67-100%)] [2:Exposed: Yes Fascia: No Tendon: No  Muscle: No Joint: No Bone: No Medium (34-66%)] Epithelialization: [1:N/A] [10:N/A] [2:N/A] Debridement: [1:N/A] [10:N/A] [2:N/A] Tissue Debrided: [1:N/A] [10:N/A] [2:N/A] Level: [1:N/A] [10:N/A] [2:N/A] Debridement A (sq cm): [1:rea N/A] [10:N/A] [2:N/A] Instrument: [1:N/A] [10:N/A] [2:N/A] Bleeding: [1:N/A] [10:N/A] [2:N/A] Hemostasis A chieved: [1:N/A] [10:N/A] [2:N/A] Procedural Pain: [1:N/A] [10:N/A] [2:N/A] Post Procedural Pain: Debridement Treatment Response: N/A [10:N/A] [2:N/A] Post Debridement Measurements L x N/A [10:N/A] [2:N/A] W x D (cm) [1:N/A] [10:N/A] [2:N/A] Post Debridement Volume: (cm) [1:N/A] [10:N/A] [2:Compression Therapy] Wound Number: [id number]  Photos: No Photos No Photos No Photos Right, Medial Lower Leg Right, Lateral Lower Leg Right, Lateral Foot Wound Location: Gradually Appeared Gradually Appeared Gradually Appeared Wounding Event: Diabetic Wound/Ulcer of the Lower Diabetic Wound/Ulcer of the Lower Diabetic  Wound/Ulcer of the Lower Primary Etiology: Extremity Extremity Extremity Congestive Heart Failure, Congestive Heart Failure, Congestive Heart Failure, Comorbid History: Hypertension, Type II Diabetes  Hypertension, Type II Diabetes Hypertension, Type II Diabetes 08/09/2019 08/09/2019 08/02/2019 Date Acquired: 3 3 3  Weeks of Treatment: Open Healed - Epithelialized Open Wound Status: No Yes No Clustered Wound: 0.5x3.5x0.1 0x0x0 4.2x2.5x0.9 Measurements L x W x D (cm) 1.374 0 8.247 A (cm) : rea 0.137 0 7.422 Volume (cm) : 44.50% 100.00% 22.20% % Reduction in A rea: 44.50% 100.00% 0.00% % Reduction in Volume: Grade 2 Grade 2 Grade 2 Classification: Medium None Present Medium Exudate A mount: Serous N/A Serosanguineous Exudate Type: amber N/A red, Jeffery [name] Exudate Color: Flat and Intact Distinct, outline attached Well defined, not attached Wound Margin: Large (67-100%) None Present (0%) Medium (34-66%) Granulation A mount: Pink N/A Red, Pink Granulation Quality: None Present (0%) None Present (0%) Medium (34-66%) Necrotic A mount: Fat Layer (Subcutaneous Tissue) Fascia: No Fat Layer (Subcutaneous Tissue) Exposed Structures: Exposed: Yes Fat Layer (Subcutaneous Tissue) Exposed: Yes Fascia: No Exposed: No Fascia: No Tendon: No Tendon: No Tendon: No Muscle: No Muscle: No Muscle: No Joint: No Joint: No Joint: No Bone: No Bone: No Bone: No Medium (34-66%) Large (67-100%) None Epithelialization: N/A N/A Debridement - Excisional Debridement: Pre-procedure Verification/Time Out N/A N/A 16:50 Taken: N/A N/A Subcutaneous, Slough Tissue Debrided: N/A N/A Skin/Subcutaneous Tissue Level: N/A N/A 10.5 Debridement A (sq cm): rea N/A N/A Curette Instrument: N/A N/A Moderate Bleeding: N/A N/A Silver Nitrate Hemostasis A chieved: N/A N/A 0 Procedural Pain: N/A N/A 0 Post Procedural Pain: N/A N/A Procedure was tolerated well Debridement Treatment Response: N/A N/A 4.2x2.5x0.9 Post Debridement Measurements L x W x D (cm) N/A N/A 7.422 Post Debridement Volume: (cm) Compression Therapy N/A Compression Therapy Procedures Performed: Debridement Wound Number:  8 N/A N/A Photos: No Photos N/A N/A Right, Proximal, Lateral Foot N/A N/A Wound Location: Gradually Appeared N/A N/A Wounding Event: Diabetic Wound/Ulcer of the Lower N/A N/A Primary Etiology: Extremity Congestive Heart Failure, N/A N/A Comorbid History: Hypertension, Type II Diabetes 08/02/2019 N/A N/A Date A cquired: 3 N/A N/A Weeks of Treatment: Healed - Epithelialized N/A N/A Wound Status: No N/A N/A Clustered Wound: 0x0x0 N/A N/A Measurements L x W x D (cm) 0 N/A N/A A (cm) : rea 0 N/A N/A Volume (cm) : 100.00% N/A N/A % Reduction in A rea: 100.00% N/A N/A % Reduction in Volume: Grade 2 N/A N/A Classification: None Present N/A N/A Exudate A mount: N/A N/A N/A Exudate Type: N/A N/A N/A Exudate Color: Distinct, outline attached N/A N/A Wound Margin: None Present (0%) N/A N/A Granulation A mount: N/A N/A N/A Granulation Quality: None Present (0%) N/A N/A Necrotic A mount: Fascia: No N/A N/A Exposed Structures: Fat Layer (Subcutaneous Tissue) Exposed: No Tendon: No Muscle: No Joint: No Bone: No Large (67-100%) N/A N/A Epithelialization: N/A N/A N/A Debridement: N/A N/A N/A Tissue Debrided: N/A N/A N/A Level: N/A N/A N/A Debridement A (sq cm): rea N/A N/A N/A Instrument: N/A N/A N/A Bleeding: N/A N/A N/A Hemostasis A chieved: N/A N/A N/A Procedural Pain: N/A N/A N/A Post Procedural Pain: Debridement Treatment Response: N/A N/A N/A Post Debridement Measurements L x N/A N/A N/A W x D (cm) N/A N/A N/A Post Debridement Volume: (cm) N/A N/A N/A Procedures Performed: Treatment Notes Electronic Signature(s) Signed: 11/16/2019 5:36:57 PM By: Jeffery Hurst RN, BSN Signed: 11/16/2019 5:57:44 PM By: Linton Ham MD Entered By: Linton Ham on 11/16/2019 17:12:24 -------------------------------------------------------------------------------- Multi-Disciplinary Care Plan Details  Patient Name: Date of Service: Jeffery Porter  [name]. 11/16/2019 3:45 PM Medical Record Number: [id number] Patient Account Number: [id number] Date of Birth/Sex: Treating RN: 03/22/1961 (59 y.o. Jeffery [name] Primary Care Chalet Kerwin: Jeffery [name] Other Clinician: Referring Billey Wojciak: Treating Ferman Basilio/Extender: Jeffery [name] Weeks in Treatment: 3 Active Inactive Abuse / Safety / Falls / Self Care Management Nursing Diagnoses: Potential for falls Potential for injury related to falls Goals: Patient will remain injury free related to falls Date Initiated: 10/23/2019 Target Resolution Date: 11/24/2019 Goal Status: Active Patient/caregiver will verbalize/demonstrate measures taken to prevent injury and/or falls Date Initiated: 10/23/2019 Target Resolution Date: 11/24/2019 Goal Status: Active Interventions: Assess Activities of Daily Living upon admission and as needed Assess fall risk on admission and as needed Assess: immobility, friction, shearing, incontinence upon admission and as needed Assess impairment of mobility on admission and as needed per policy Assess personal safety and home safety (as indicated) on admission and as needed Assess self care needs on admission and as needed Provide education on fall prevention Provide education on personal and home safety Notes: Nutrition Nursing Diagnoses: Impaired glucose control: actual or potential Potential for alteratiion in Nutrition/Potential for imbalanced nutrition Goals: Patient/caregiver agrees to and verbalizes understanding of need to use nutritional supplements and/or vitamins as prescribed Date Initiated: 10/23/2019 Target Resolution Date: 11/24/2019 Goal Status: Active Patient/caregiver will maintain therapeutic glucose control Date Initiated: 10/23/2019 Target Resolution Date: 11/24/2019 Goal Status: Active Interventions: Assess HgA1c results as ordered upon admission and as needed Assess patient nutrition upon admission and as needed per  policy Provide education on elevated blood sugars and impact on wound healing Provide education on nutrition Treatment Activities: Education provided on Nutrition : 10/23/2019 Notes: Venous Leg Ulcer Nursing Diagnoses: Knowledge deficit related to disease process and management Potential for venous Insuffiency (use before diagnosis confirmed) Goals: Patient will maintain optimal edema control Date Initiated: 10/23/2019 Target Resolution Date: 11/24/2019 Goal Status: Active Patient/caregiver will verbalize understanding of disease process and disease management Date Initiated: 10/23/2019 Target Resolution Date: 11/24/2019 Goal Status: Active Interventions: Assess peripheral edema status every visit. Compression as ordered Provide education on venous insufficiency Notes: Wound/Skin Impairment Nursing Diagnoses: Impaired tissue integrity Knowledge deficit related to ulceration/compromised skin integrity Goals: Patient/caregiver will verbalize understanding of skin care regimen Date Initiated: 10/23/2019 Target Resolution Date: 11/24/2019 Goal Status: Active Interventions: Assess patient/caregiver ability to obtain necessary supplies Assess patient/caregiver ability to perform ulcer/skin care regimen upon admission and as needed Assess ulceration(s) every visit Provide education on ulcer and skin care Notes: Electronic Signature(s) Signed: 11/16/2019 5:36:57 PM By: Jeffery Hurst RN, BSN Entered By: Jeffery [name] on 11/16/2019 17:36:10 -------------------------------------------------------------------------------- Pain Assessment Details Patient Name: Date of Service: Jeffery [name]. 11/16/2019 3:45 PM Medical Record Number: [id number] Patient Account Number: [id number] Date of Birth/Sex: Treating RN: March 15, 1961 (59 y.o. Jeffery [name] Primary Care Tynell Winchell: Jeffery [name] Other Clinician: Referring Sharona Rovner: Treating Izzie Geers/Extender: Jeffery [name] Weeks in Treatment: 3 Active Problems Location of Pain Severity and Description of Pain Patient Has Paino No Site Locations Pain Management and Medication Current Pain Management: Electronic Signature(s) Signed: 11/16/2019 5:22:44 PM By: Kela Millin Entered By: Kela Millin on 11/16/2019 16:06:17 -------------------------------------------------------------------------------- Patient/Caregiver Education Details Patient Name: Date of Service: Jeffery [name] 8/19/2021andnbsp3:45 PM Medical Record Number: [id number] Patient Account Number: [id number] Date of Birth/Gender: Treating RN: October 10, 1960 (59 y.o. Jeffery [name] Primary Care Physician: Jeffery [name] Other Clinician: Referring Physician: Treating Physician/Extender: Jeffery [name] Weeks in Treatment:  3 Education Assessment Education Provided To: Patient Education Topics Provided Wound/Skin Impairment: Methods: Explain/Verbal Responses: State content correctly Electronic Signature(s) Signed: 11/16/2019 5:36:57 PM By: Jeffery Hurst RN, BSN Entered By: Jeffery [name] on 11/16/2019 17:36:20 -------------------------------------------------------------------------------- Wound Assessment Details Patient Name: Date of Service: Jeffery [name]. 11/16/2019 3:45 PM Medical Record Number: [id number] Patient Account Number: [id number] Date of Birth/Sex: Treating RN: 1961-01-15 (59 y.o. Jeffery [name] Primary Care Develle Sievers: Jeffery [name] Other Clinician: Referring Aniesa Boback: Treating Joshawa Dubin/Extender: Jeffery [name] Weeks in Treatment: 3 Wound Status Wound Number: 1 Primary Etiology: Diabetic Wound/Ulcer of the Lower Extremity Wound Location: Left, Proximal, Anterior Lower Leg Wound Status: Healed - Epithelialized Wounding Event: Gradually Appeared Comorbid History: Congestive Heart Failure, Hypertension, Type II Diabetes Date Acquired:  08/09/2019 Weeks Of Treatment: 3 Clustered Wound: No Wound Measurements Length: (cm) Width: (cm) Depth: (cm) Area: (cm) Volume: (cm) 0 % Reduction in Area: 100% 0 % Reduction in Volume: 100% 0 Epithelialization: Large (67-100%) 0 Tunneling: No 0 Undermining: No Wound Description Classification: Grade 2 Wound Margin: Distinct, outline attached Exudate Amount: None Present Wound Bed Granulation Amount: None Present (0%) Necrotic Amount: None Present (0%) Foul Odor After Cleansing: No Slough/Fibrino No Exposed Structure Fascia Exposed: No Fat Layer (Subcutaneous Tissue) Exposed: No Tendon Exposed: No Muscle Exposed: No Joint Exposed: No Bone Exposed: No Electronic Signature(s) Signed: 11/16/2019 5:22:44 PM By: Kela Millin Entered By: Kela Millin on 11/16/2019 16:16:28 -------------------------------------------------------------------------------- Wound Assessment Details Patient Name: Date of Service: Jeffery [name]. 11/16/2019 3:45 PM Medical Record Number: [id number] Patient Account Number: [id number] Date of Birth/Sex: Treating RN: April 10, 1960 (59 y.o. Jeffery [name] Primary Care Maleta Pacha: Jeffery [name] Other Clinician: Referring Tomaz Janis: Treating Zalia Hautala/Extender: Jeffery [name] Weeks in Treatment: 3 Wound Status Wound Number: 10 Primary Etiology: Diabetic Wound/Ulcer of the Lower Extremity Wound Location: Left [name] Second oe Wound Status: Healed - Epithelialized Wounding Event: Gradually Appeared Comorbid History: Congestive Heart Failure, Hypertension, Type II Diabetes Date Acquired: 10/23/2019 Weeks Of Treatment: 3 Clustered Wound: No Wound Measurements Length: (cm) Width: (cm) Depth: (cm) Area: (cm) Volume: (cm) 0 % Reduction in Area: 100% 0 % Reduction in Volume: 100% 0 Epithelialization: Large (67-100%) 0 Tunneling: No 0 Undermining: No Wound Description Classification: Grade 2 Wound Margin:  Distinct, outline attached Exudate Amount: None Present Foul Odor After Cleansing: No Slough/Fibrino No Wound Bed Granulation Amount: None Present (0%) Exposed Structure Necrotic Amount: None Present (0%) Fascia Exposed: No Fat Layer (Subcutaneous Tissue) Exposed: No Tendon Exposed: No Muscle Exposed: No Joint Exposed: No Bone Exposed: No Electronic Signature(s) Signed: 11/16/2019 5:22:44 PM By: Kela Millin Entered By: Kela Millin on 11/16/2019 16:15:58 -------------------------------------------------------------------------------- Wound Assessment Details Patient Name: Date of Service: Jeffery [name]. 11/16/2019 3:45 PM Medical Record Number: [id number] Patient Account Number: [id number] Date of Birth/Sex: Treating RN: 1960-05-06 (59 y.o. Jeffery [name] Primary Care Dashonna Chagnon: Jeffery [name] Other Clinician: Referring Siboney Requejo: Treating Hatcher Froning/Extender: Jeffery [name] Weeks in Treatment: 3 Wound Status Wound Number: 2 Primary Etiology: Diabetic Wound/Ulcer of the Lower Extremity Wound Location: Left, Lateral Lower Leg Wound Status: Open Wounding Event: Gradually Appeared Comorbid History: Congestive Heart Failure, Hypertension, Type II Diabetes Date Acquired: 08/09/2019 Weeks Of Treatment: 3 Clustered Wound: No Wound Measurements Length: (cm) 0.5 Width: (cm) 0.5 Depth: (cm) 0.1 Area: (cm) 0.196 Volume: (cm) 0.02 % Reduction in Area: 37.6% % Reduction in Volume: 35.5% Epithelialization: Medium (34-66%) Tunneling: No Undermining: No Wound Description Classification: Grade 2 Wound Margin: Flat and Intact Exudate  Amount: Medium Exudate Type: Serous Exudate Color: amber Foul Odor After Cleansing: No Slough/Fibrino No Wound Bed Granulation Amount: Large (67-100%) Exposed Structure Granulation Quality: Pink Fascia Exposed: No Necrotic Amount: None Present (0%) Fat Layer (Subcutaneous Tissue) Exposed: Yes Tendon  Exposed: No Muscle Exposed: No Joint Exposed: No Bone Exposed: No Treatment Notes Wound #2 (Left, Lateral Lower Leg) 2. Periwound Care Moisturizing lotion 3. Primary Dressing Applied Calcium Alginate Ag 4. Secondary Dressing Dry Gauze 6. Support Layer Applied 4 layer compression wrap Electronic Signature(s) Signed: 11/16/2019 5:22:44 PM By: Kela Millin Entered By: Kela Millin on 11/16/2019 16:17:33 -------------------------------------------------------------------------------- Wound Assessment Details Patient Name: Date of Service: Jeffery Slice [facility] [name]. 11/16/2019 3:45 PM Medical Record Number: [id number] Patient Account Number: [id number] Date of Birth/Sex: Treating RN: Aug 03, 1960 (59 y.o. Jeffery [name] Primary Care Najma Bozarth: Jeffery [name] Other Clinician: Referring Jaquavian Firkus: Treating Jazmyn Offner/Extender: Jeffery [name] Weeks in Treatment: 3 Wound Status Wound Number: 5 Primary Etiology: Diabetic Wound/Ulcer of the Lower Extremity Wound Location: Right, Medial Lower Leg Wound Status: Open Wounding Event: Gradually Appeared Comorbid History: Congestive Heart Failure, Hypertension, Type II Diabetes Date Acquired: 08/09/2019 Weeks Of Treatment: 3 Clustered Wound: No Wound Measurements Length: (cm) 0.5 Width: (cm) 3.5 Depth: (cm) 0.1 Area: (cm) 1.374 Volume: (cm) 0.137 % Reduction in Area: 44.5% % Reduction in Volume: 44.5% Epithelialization: Medium (34-66%) Tunneling: No Undermining: No Wound Description Classification: Grade 2 Wound Margin: Flat and Intact Exudate Amount: Medium Exudate Type: Serous Exudate Color: amber Foul Odor After Cleansing: No Slough/Fibrino No Wound Bed Granulation Amount: Large (67-100%) Exposed Structure Granulation Quality: Pink Fascia Exposed: No Necrotic Amount: None Present (0%) Fat Layer (Subcutaneous Tissue) Exposed: Yes Tendon Exposed: No Muscle Exposed: No Joint Exposed: No Bone  Exposed: No Treatment Notes Wound #5 (Right, Medial Lower Leg) 2. Periwound Care Moisturizing lotion 3. Primary Dressing Applied Calcium Alginate Ag 4. Secondary Dressing Dry Gauze 6. Support Layer Applied 4 layer compression wrap Electronic Signature(s) Signed: 11/16/2019 5:22:44 PM By: Kela Millin Entered By: Kela Millin on 11/16/2019 16:18:24 -------------------------------------------------------------------------------- Wound Assessment Details Patient Name: Date of Service: Jeffery [name]. 11/16/2019 3:45 PM Medical Record Number: [id number] Patient Account Number: [id number] Date of Birth/Sex: Treating RN: 09-30-1960 (59 y.o. Jeffery [name] Primary Care Tyishia Aune: Jeffery [name] Other Clinician: Referring Ronda Kazmi: Treating Camron Essman/Extender: Jeffery [name] Weeks in Treatment: 3 Wound Status Wound Number: 6 Primary Etiology: Diabetic Wound/Ulcer of the Lower Extremity Wound Location: Right, Lateral Lower Leg Wound Status: Healed - Epithelialized Wounding Event: Gradually Appeared Comorbid History: Congestive Heart Failure, Hypertension, Type II Diabetes Date Acquired: 08/09/2019 Weeks Of Treatment: 3 Clustered Wound: Yes Wound Measurements Length: (cm) Width: (cm) Depth: (cm) Area: (cm) Volume: (cm) 0 % Reduction in Area: 100% 0 % Reduction in Volume: 100% 0 Epithelialization: Large (67-100%) 0 Tunneling: No 0 Undermining: No Wound Description Classification: Grade 2 Wound Margin: Distinct, outline attached Exudate Amount: None Present Foul Odor After Cleansing: No Slough/Fibrino No Wound Bed Granulation Amount: None Present (0%) Exposed Structure Necrotic Amount: None Present (0%) Fascia Exposed: No Fat Layer (Subcutaneous Tissue) Exposed: No Tendon Exposed: No Muscle Exposed: No Joint Exposed: No Bone Exposed: No Electronic Signature(s) Signed: 11/16/2019 5:22:44 PM By: Kela Millin Entered By:  Kela Millin on 11/16/2019 16:18:48 -------------------------------------------------------------------------------- Wound Assessment Details Patient Name: Date of Service: Jeffery [name]. 11/16/2019 3:45 PM Medical Record Number: [id number] Patient Account Number: [id number] Date of Birth/Sex: Treating RN: 04/27/1960 (59 y.o. Jeffery [name] Primary Care Senay Sistrunk: Jeffery [name] Other  Clinician: Referring Spruha Weight: Treating Kinzie Wickes/Extender: Jeffery [name] Weeks in Treatment: 3 Wound Status Wound Number: 7 Primary Etiology: Diabetic Wound/Ulcer of the Lower Extremity Wound Location: Right, Lateral Foot Wound Status: Open Wounding Event: Gradually Appeared Comorbid History: Congestive Heart Failure, Hypertension, Type II Diabetes Date Acquired: 08/02/2019 Weeks Of Treatment: 3 Clustered Wound: No Wound Measurements Length: (cm) 4.2 Width: (cm) 2.5 Depth: (cm) 0.9 Area: (cm) 8.247 Volume: (cm) 7.422 % Reduction in Area: 22.2% % Reduction in Volume: 0% Epithelialization: None Tunneling: No Undermining: No Wound Description Classification: Grade 2 Wound Margin: Well defined, not attached Exudate Amount: Medium Exudate Type: Serosanguineous Exudate Color: red, Mani Foul Odor After Cleansing: No Slough/Fibrino Yes Wound Bed Granulation Amount: Medium (34-66%) Exposed Structure Granulation Quality: Red, Pink Fascia Exposed: No Necrotic Amount: Medium (34-66%) Fat Layer (Subcutaneous Tissue) Exposed: Yes Necrotic Quality: Adherent Slough Tendon Exposed: No Muscle Exposed: No Joint Exposed: No Bone Exposed: No Treatment Notes Wound #7 (Right, Lateral Foot) 2. Periwound Care Moisturizing lotion 3. Primary Dressing Applied Hydrofera Blue 4. Secondary Dressing Dry Gauze 6. Support Layer Applied 4 layer compression wrap Electronic Signature(s) Signed: 11/16/2019 5:22:44 PM By: Kela Millin Entered By: Kela Millin on  11/16/2019 16:18:00 -------------------------------------------------------------------------------- Wound Assessment Details Patient Name: Date of Service: Jeffery [name]. 11/16/2019 3:45 PM Medical Record Number: [id number] Patient Account Number: [id number] Date of Birth/Sex: Treating RN: 1961/02/22 (59 y.o. Jeffery [name] Primary Care Rasheedah Reis: Jeffery [name] Other Clinician: Referring Dalaya Suppa: Treating Arisbel Maione/Extender: Jeffery [name] Weeks in Treatment: 3 Wound Status Wound Number: 8 Primary Etiology: Diabetic Wound/Ulcer of the Lower Extremity Wound Location: Right, Proximal, Lateral Foot Wound Status: Healed - Epithelialized Wounding Event: Gradually Appeared Comorbid History: Congestive Heart Failure, Hypertension, Type II Diabetes Date Acquired: 08/02/2019 Weeks Of Treatment: 3 Clustered Wound: No Wound Measurements Length: (cm) Width: (cm) Depth: (cm) Area: (cm) Volume: (cm) 0 % Reduction in Area: 100% 0 % Reduction in Volume: 100% 0 Epithelialization: Large (67-100%) 0 Tunneling: No 0 Undermining: No Wound Description Classification: Grade 2 Wound Margin: Distinct, outline attached Exudate Amount: None Present Foul Odor After Cleansing: No Slough/Fibrino No Wound Bed Granulation Amount: None Present (0%) Exposed Structure Necrotic Amount: None Present (0%) Fascia Exposed: No Fat Layer (Subcutaneous Tissue) Exposed: No Tendon Exposed: No Muscle Exposed: No Joint Exposed: No Bone Exposed: No Electronic Signature(s) Signed: 11/16/2019 5:22:44 PM By: Kela Millin Entered By: Kela Millin on 11/16/2019 16:17:04 -------------------------------------------------------------------------------- [place] Details Patient Name: Date of Service: Jeffery [name], [place] [name]. 11/16/2019 3:45 PM Medical Record Number: [id number] Patient Account Number: [id number] Date of Birth/Sex: Treating RN: [birth year] (59 y.o. Jeffery [name] Primary Care Taraneh Metheney: Jeffery [name] Other Clinician: Referring Eman Morimoto: Treating Adilenne Ashworth/Extender: Jeffery [name] Weeks in Treatment: 3 Vital Signs Time Taken: 16:00 Temperature (F): 98.3 Height (in): 74 Pulse (bpm): 92 Weight (lbs): 345 Respiratory Rate (breaths/min): 19 Body Mass Index (BMI): 44.3 Blood Pressure (mmHg): 129/74 Capillary Blood Glucose (mg/dl): 129 Reference Range: 80 - 120 mg / dl Notes patient stated CBG this morning was 129 Electronic Signature(s) Signed: 11/16/2019 5:22:44 PM By: Kela Millin Entered By: Kela Millin on 11/16/2019 92:01:00

## 2019-11-16 NOTE — Progress Notes (Signed)
Jeffery [name], Jeffery [name] ([id number]) . Visit Report for 11/16/2019 Debridement Details Patient Name: Date of Service: Jeffery [name]. 11/16/2019 3:45 PM Medical Record Number: [id number] Patient Account Number: [id number] Date of Birth/Sex: Treating RN: Jul 09, 1960 (59 y.o. [name] Primary Care Provider: [name] Other Clinician: Referring Provider: Treating Provider/Extender: [name] Weeks in Treatment: 3 Debridement Performed for Assessment: Wound #7 Right,Lateral Foot Performed By: Physician Ricard Dillon., MD Debridement Type: Debridement Severity of Tissue Pre Debridement: Fat layer exposed Level of Consciousness (Pre-procedure): Awake and Alert Pre-procedure Verification/Time Out Yes - 16:50 Taken: Start Time: 16:50 [name] Area Debrided (L x W): otal 4.2 (cm) x 2.5 (cm) = 10.5 (cm) Tissue and other material debrided: Viable, Non-Viable, Slough, Subcutaneous, Slough Level: Skin/Subcutaneous Tissue Debridement Description: Excisional Instrument: Curette Bleeding: Moderate Hemostasis Achieved: Silver Nitrate End Time: 16:52 Procedural Pain: 0 Post Procedural Pain: 0 Response to Treatment: Procedure was tolerated well Level of Consciousness (Post- Awake and Alert procedure): Post Debridement Measurements of Total Wound Length: (cm) 4.2 Width: (cm) 2.5 Depth: (cm) 0.9 Volume: (cm) 7.422 Character of Wound/Ulcer Post Debridement: Improved Severity of Tissue Post Debridement: Fat layer exposed Post Procedure Diagnosis Same as Pre-procedure Electronic Signature(s) Signed: 11/16/2019 5:36:57 PM By: [name] RN, BSN Signed: 11/16/2019 5:57:44 PM By: Linton Ham MD Entered By: Linton Ham on 11/16/2019 17:12:36 -------------------------------------------------------------------------------- HPI Details Patient Name: Date of Service: Jeffery [name]. 11/16/2019 3:45 PM Medical Record Number: [id number] Patient  Account Number: [id number] Date of Birth/Sex: Treating RN: June 19, 1960 (59 y.o. [name] Primary Care Provider: [name] Other Clinician: Referring Provider: Treating Provider/Extender: [name] Weeks in Treatment: 3 History of Present Illness HPI Description: ADMISSION 10/23/2019 This is a complex 59 year old man who is here with wounds on his bilateral lower legs feet and abrasion on his left forearm. These are generally of different etiology. He is a type II diabetic with peripheral neuropathy but does not have known PAD. Looking through [facility] health link I can see he was being followed by Dr. Earleen Newport earlier this year for a diabetic ulcer on the right foot. An MRI done in May showed no osteomyelitis. Shortly thereafter he developed cellulitis of the right leg and foot and was hospitalized from 5/10 through 5/19 with MSSA sepsis. During this hospitalization he was felt to have osteomyelitis he had debridement twice of the right foot and on one occasion apparently a placement of ACell. He again was hospitalized from 6/10 through 7/1 again with left lower extremity cellulitis increasing edema and acute renal failure. An MRI showed left leg cellulitis but no other major findings. 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 He was discharged with multiple wounds on his legs which apparently started as blisters secondary to fluid overload. His creatinine on 7/5 was 7.49 potassium 4 BUN 116 CO2 of 23 albumin of 2.9. He was seen by Dr. Moshe Cipro of nephrology medications were adjusted she is following him. 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 The patient arrives in clinic today with multiple wounds of different etiologies on his bilateral lower extremities. On the right leg he has superficial areas on the right medial and lateral.. On his right lateral foot I think the  original surgical wound. There is a small area at the base of his left second toe. On the left lower extremity a fairly large wound on the left second toe  which was trauma from the shower door. He has several areas on the legs. Which are superficial. Finally he has an abrasion injury on his left arm/skin tear Past medical history includes Charcot foot. Left third toe amputation, type 2 diabetes with peripheral neuropathy, paroxysmal A. fib on Eliquis, diastolic heart failure, acute on chronic renal failure as described. ABI in our clinic was 1.18 on the right 1.19 on the left 11/03/2019 upon evaluation today patient appears to be doing well with regard to his ulcers in general. He is making good progress. The worst is his larger right lateral foot ulcer. This wound is going require some sharp debridement today. Other than that he seems to be doing quite well. 11/10/19-Patient has denuded areas on both legs especially the right where there is more stasis changes and friable skin, areas on his right leg appear to be bigger than before, right lateral foot wound appears about the same. We are using Hydrofera Blue to the foot wound and silver alginate on the rest with 4 layer compression 8/19; patient's wounds on his bilateral anterior lower legs look a lot better. These appear to be progressing towards healing and some of them actually have healed. The most substantial area is on the right lateral foot. I thought this was his original surgical wound site. This has raised hyper granulation tissue and a completely nonviable surface. We have been using silver alginate to all the wounds on his legs under compression and Hydrofera Blue on the right lateral Electronic Signature(s) Signed: 11/16/2019 5:57:44 PM By: Linton Ham MD Entered By: Linton Ham on 11/16/2019 17:13:56 -------------------------------------------------------------------------------- Physical Exam Details Patient Name: Date of  Service: Jeffery [name]. 11/16/2019 3:45 PM Medical Record Number: [id number] Patient Account Number: [id number] Date of Birth/Sex: Treating RN: 23-Aug-1960 (59 y.o. [name] Primary Care Provider: [name] Other Clinician: Referring Provider: Treating Provider/Extender: [name] Weeks in Treatment: 3 Constitutional Sitting or standing Blood Pressure is within target range for patient.. Pulse regular and within target range for patient.Marland Kitchen Respirations regular, non-labored and within target range.. Temperature is normal and within the target range for the patient.Marland Kitchen Appears in no distress. Cardiovascular Pedal pulses are palpable. We have good edema control. Notes Wound exam; the right lateral foot wound has hyper granulation but a completely nonviable fibrinous surface over the area. He is I used a #5 curette to debride this area aggressively and silver nitrate for hemostasis and reduction in hyper granulation. Electronic Signature(s) Signed: 11/16/2019 5:57:44 PM By: Linton Ham MD Entered By: Linton Ham on 11/16/2019 17:15:09 -------------------------------------------------------------------------------- Physician Orders Details Patient Name: Date of Service: Jeffery [name]. 11/16/2019 3:45 PM Medical Record Number: [id number] Patient Account Number: [id number] Date of Birth/Sex: Treating RN: 11-02-1960 (59 y.o. [name] Primary Care Provider: [name] Other Clinician: Referring Provider: Treating Provider/Extender: [name] Weeks in Treatment: 3 Verbal / Phone Orders: No Diagnosis Coding ICD-10 Coding Code Description E11.621 Type 2 diabetes mellitus with foot ulcer T81.31XA Disruption of external operation (surgical) wound, not elsewhere classified, initial encounter L97.811 Non-pressure chronic ulcer of other part of right lower leg limited to breakdown of skin L97.821  Non-pressure chronic ulcer of other part of left lower leg limited to breakdown of skin S90.812D Abrasion, left foot, subsequent encounter S50.812D Abrasion of left forearm, subsequent encounter 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 Follow-up Appointments  Return Appointment in 1 week. Dressing Change Frequency Change dressing three times week. - all wounds (wound clinic to change on Friday, home health to change on Monday and Wednesday) Skin Barriers/Peri-Wound Care Moisturizing lotion - both legs Wound Cleansing Clean wound with Normal Saline. - or wound cleanser Primary Wound Dressing Wound #2 Left,Lateral Lower Leg Calcium Alginate with Silver Wound #5 Right,Medial Lower Leg Calcium Alginate with Silver Wound #7 Right,Lateral Foot Hydrofera Blue - Classic Secondary Dressing Dry Gauze - all wounds ABD pad - all wounds on legs Edema Control 4 layer compression - Bilateral - unna boot to top to anchor Avoid standing for long periods of time Elevate legs to the level of the heart or above for 30 minutes daily and/or when sitting, a frequency of: - throughout the day Exercise regularly [place] skilled [facility] for wound care. - [facility] Electronic Signature(s) Signed: 11/16/2019 5:36:57 PM By: [name] RN, BSN Signed: 11/16/2019 5:57:44 PM By: Linton Ham MD Entered By: [name] on 11/16/2019 16:54:58 -------------------------------------------------------------------------------- Problem List Details Patient Name: Date of Service: Jeffery [name]. 11/16/2019 3:45 PM Medical Record Number: [id number] Patient Account Number: [id number] Date of Birth/Sex: Treating RN: Sep 28, 1960 (59 y.o. [name] Primary Care Provider: [name] Other Clinician: Referring Provider: Treating Provider/Extender: [name] Weeks in Treatment: 3 Active Problems ICD-10 Encounter Code Description Active Date MDM Diagnosis E11.621 Type 2 diabetes mellitus with foot ulcer 10/23/2019 No Yes T81.31XA Disruption of external operation (surgical) wound, not elsewhere classified, 10/23/2019 No Yes initial encounter L97.811 Non-pressure chronic ulcer of other part of right lower leg limited to breakdown 10/23/2019 No Yes of skin L97.821 Non-pressure chronic ulcer of other part of left lower leg limited to breakdown 10/23/2019 No Yes of skin S90.812D Abrasion, left foot, subsequent encounter 10/23/2019 No Yes S50.812D Abrasion of left forearm, subsequent encounter 10/23/2019 No Yes E11.22 Type 2 diabetes mellitus with diabetic chronic kidney disease 10/23/2019 No Yes L97.512 Non-pressure chronic ulcer of other part of right foot with fat layer exposed 11/03/2019 No Yes Inactive Problems Resolved Problems Electronic Signature(s) Signed: 11/16/2019 5:57:44 PM By: Linton Ham MD Entered By: Linton Ham on 11/16/2019 17:12:16 -------------------------------------------------------------------------------- Progress Note Details Patient Name: Date of Service: Jeffery [name]. 11/16/2019 3:45 PM Medical Record Number: [id number] Patient Account Number: [id number] Date of Birth/Sex: Treating RN: 06-01-1960 (59 y.o. [name] Primary Care Provider: [name] Other Clinician: Referring Provider: Treating Provider/Extender: [name] Weeks in Treatment: 3 Subjective History of Present Illness (HPI) ADMISSION 10/23/2019 This is a complex 59 year old man who is here with wounds on his bilateral lower legs feet and abrasion on his left forearm. These are generally of different etiology. He is a type II diabetic with peripheral neuropathy but does not have known PAD. Looking through [facility] health link I can see he was being followed by Dr. Earleen Newport earlier this  year for a diabetic ulcer on the right foot. An MRI done in May showed no osteomyelitis. Shortly thereafter he developed cellulitis of the right leg and foot and was hospitalized from 5/10 through 5/19 with MSSA sepsis. During this hospitalization he was felt to have osteomyelitis he had debridement twice of the right foot and on one occasion apparently a placement of ACell. He again was hospitalized from 6/10 through 7/1 again with left lower extremity cellulitis increasing edema and acute renal failure. An MRI showed left leg cellulitis but no other major findings. 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 He was discharged with multiple wounds on his legs which apparently started as blisters secondary to fluid overload. His creatinine on 7/5 was 7.49 potassium 4 BUN 116 CO2 of 23 albumin of 2.9. He was seen by Dr. Moshe Cipro of nephrology medications were adjusted she is following him. 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 The patient arrives in clinic today with multiple wounds of different etiologies on his bilateral lower extremities. ooOn the right leg he has superficial areas on the right medial and lateral.. On his right lateral foot I think the original surgical wound. There is a small area at the base of his left second toe. ooOn the left lower extremity a fairly large wound on the left second toe which was trauma from the shower door. He has several areas on the legs. Which are superficial. ooFinally he has an abrasion injury on his left arm/skin tear Past medical history includes Charcot foot. Left third toe amputation, type 2 diabetes with peripheral neuropathy, paroxysmal A. fib on Eliquis, diastolic heart failure, acute on chronic renal failure as described. ABI in our clinic was 1.18 on the right 1.19 on the left 11/03/2019 upon evaluation today patient appears to be doing well with regard  to his ulcers in general. He is making good progress. The worst is his larger right lateral foot ulcer. This wound is going require some sharp debridement today. Other than that he seems to be doing quite well. 11/10/19-Patient has denuded areas on both legs especially the right where there is more stasis changes and friable skin, areas on his right leg appear to be bigger than before, right lateral foot wound appears about the same. We are using Hydrofera Blue to the foot wound and silver alginate on the rest with 4 layer compression 8/19; patient's wounds on his bilateral anterior lower legs look a lot better. These appear to be progressing towards healing and some of them actually have healed. The most substantial area is on the right lateral foot. I thought this was his original surgical wound site. This has raised hyper granulation tissue and a completely nonviable surface. We have been using silver alginate to all the wounds on his legs under compression and Hydrofera Blue on the right lateral Objective Constitutional Sitting or standing Blood Pressure is within target range for patient.. Pulse regular and within target range for patient.Marland Kitchen Respirations regular, non-labored and within target range.. Temperature is normal and within the target range for the patient.Marland Kitchen Appears in no distress. Vitals Time Taken: 4:00 PM, Height: 74 in, Weight: 345 lbs, BMI: 44.3, Temperature: 98.3 F, Pulse: 92 bpm, Respiratory Rate: 19 breaths/min, Blood Pressure: 129/74 mmHg, Capillary Blood Glucose: 129 mg/dl. General Notes: patient stated CBG this morning was 129 Cardiovascular Pedal pulses are palpable. We have good edema control. General Notes: Wound exam; the right lateral foot wound has hyper granulation but a completely nonviable fibrinous surface over the area. He is I used a #5 curette to debride this area aggressively and silver nitrate for hemostasis and reduction in hyper granulation. Integumentary  (Hair, Skin) Wound #1 status is Healed - Epithelialized. Original cause of wound was Gradually Appeared. The wound is located on the Left,Proximal,Anterior Lower Leg. The wound measures 0cm length x 0cm width x 0cm depth; 0cm^2 area and 0cm^3 volume. There is no tunneling or undermining noted. There is a none present amount of drainage noted.  The wound margin is distinct with the outline attached to the wound base. There is no granulation within the wound bed. There is no necrotic tissue within the wound bed. Wound #10 status is Healed - Epithelialized. Original cause of wound was Gradually Appeared. The wound is located on the Left [name] Second. The wound oe measures 0cm length x 0cm width x 0cm depth; 0cm^2 area and 0cm^3 volume. There is no tunneling or undermining noted. There is a none present amount of drainage noted. The wound margin is distinct with the outline attached to the wound base. There is no granulation within the wound bed. There is no necrotic tissue within the wound bed. Wound #2 status is Open. Original cause of wound was Gradually Appeared. The wound is located on the Left,Lateral Lower Leg. The wound measures 0.5cm length x 0.5cm width x 0.1cm depth; 0.196cm^2 area and 0.02cm^3 volume. There is Fat Layer (Subcutaneous Tissue) Exposed exposed. There is no tunneling or undermining noted. There is a medium amount of serous drainage noted. The wound margin is flat and intact. There is large (67-100%) pink granulation within the wound bed. There is no necrotic tissue within the wound bed. Wound #5 status is Open. Original cause of wound was Gradually Appeared. The wound is located on the Right,Medial Lower Leg. The wound measures 0.5cm length x 3.5cm width x 0.1cm depth; 1.374cm^2 area and 0.137cm^3 volume. There is Fat Layer (Subcutaneous Tissue) Exposed exposed. There is no tunneling or undermining noted. There is a medium amount of serous drainage noted. The wound margin is flat and  intact. There is large (67-100%) pink granulation within the wound bed. There is no necrotic tissue within the wound bed. Wound #6 status is Healed - Epithelialized. Original cause of wound was Gradually Appeared. The wound is located on the Right,Lateral Lower Leg. The wound measures 0cm length x 0cm width x 0cm depth; 0cm^2 area and 0cm^3 volume. There is no tunneling or undermining noted. There is a none present amount of drainage noted. The wound margin is distinct with the outline attached to the wound base. There is no granulation within the wound bed. There is no necrotic tissue within the wound bed. Wound #7 status is Open. Original cause of wound was Gradually Appeared. The wound is located on the Right,Lateral Foot. The wound measures 4.2cm length x 2.5cm width x 0.9cm depth; 8.247cm^2 area and 7.422cm^3 volume. There is Fat Layer (Subcutaneous Tissue) Exposed exposed. There is no tunneling or undermining noted. There is a medium amount of serosanguineous drainage noted. The wound margin is well defined and not attached to the wound base. There is medium (34-66%) red, pink granulation within the wound bed. There is a medium (34-66%) amount of necrotic tissue within the wound bed including Adherent Slough. Wound #8 status is Healed - Epithelialized. Original cause of wound was Gradually Appeared. The wound is located on the Right,Proximal,Lateral Foot. The wound measures 0cm length x 0cm width x 0cm depth; 0cm^2 area and 0cm^3 volume. There is no tunneling or undermining noted. There is a none present amount of drainage noted. The wound margin is distinct with the outline attached to the wound base. There is no granulation within the wound bed. There is no necrotic tissue within the wound bed. 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 There was a Excisional Skin/Subcutaneous Tissue Debridement with a total area of 10.5 sq cm performed by Ricard Dillon., MD. With the following instrument(s): Curette to remove Viable and Non-Viable tissue/material. Material removed includes Subcutaneous Tissue and Slough and. No specimens were taken. A time out was conducted at 16:50, prior to the start of the procedure. A Moderate amount of bleeding was controlled with Silver Nitrate. The procedure was tolerated well with a pain level of 0 throughout and a pain level of 0 following the procedure. Post Debridement Measurements: 4.2cm length x 2.5cm width x 0.9cm depth; 7.422cm^3 volume. Character of Wound/Ulcer Post Debridement is improved. Severity of Tissue Post Debridement is: Fat layer exposed. Post procedure Diagnosis Wound #7: Same as Pre-Procedure Pre-procedure diagnosis of Wound #7 is a Diabetic Wound/Ulcer of the Lower Extremity located on the Right,Lateral Foot . There was a Four Layer Compression Therapy Procedure by [name], RN. Post procedure Diagnosis Wound #7: Same as Pre-Procedure Wound #2 Pre-procedure diagnosis of Wound #2 is a Diabetic Wound/Ulcer of the Lower Extremity located on the Left,Lateral Lower Leg . There was a Four Layer Compression Therapy Procedure by [name], RN. Post procedure Diagnosis Wound #2: Same as Pre-Procedure Wound  #5 Pre-procedure diagnosis of Wound #5 is a Diabetic Wound/Ulcer of the Lower Extremity located on the Right,Medial Lower Leg . There was a Four Layer Compression Therapy Procedure by [name], RN. Post procedure Diagnosis Wound #5: Same as Pre-Procedure Plan Follow-up Appointments: Return Appointment in 1 week. Dressing Change Frequency: Change dressing three times week. - all wounds (wound clinic to change on Friday, home health to change on Monday and Wednesday) Skin Barriers/Peri-Wound Care: Moisturizing lotion - both legs Wound Cleansing: Clean wound with Normal Saline. - or wound cleanser Primary Wound Dressing: Wound #2 Left,Lateral Lower Leg: Calcium Alginate with Silver Wound #5 Right,Medial Lower Leg: Calcium Alginate with Silver Wound #7 Right,Lateral Foot: Hydrofera Blue - Classic Secondary Dressing: Dry Gauze - all wounds ABD pad - all wounds on legs Edema Control: 4 layer compression - Bilateral - unna boot to top to anchor Avoid standing for long periods of time Elevate legs to the level of the heart or above for 30 minutes daily and/or when sitting, a frequency of: - throughout the day Exercise regularly Home Health: [place] [facility] for wound care. - [facility] 1. Except for the area on his right lateral foot which I think was the original surgical wound everything looks a lot better here. He has had healing of multiple wounds on his legs and I think the rest of them are on their way to closing as well 2. The area on the right lateral foot required debridement because of a completely nonviable surface. He also had hyper granulation that I removed and then used silver nitrate to further reduce this as well as achieve homeostasis. I am going to continue with the [facility] here however he may require further mechanical debridement and perhaps a change to Iodoflex if that is the case. Electronic Signature(s) Signed: 11/16/2019  5:57:44 PM  By: Linton Ham MD Entered By: Linton Ham on 11/16/2019 17:16:21 -------------------------------------------------------------------------------- SuperBill Details Patient Name: Date of Service: Jeffery [name], Jeffery [name]. 11/16/2019 Medical Record Number: [id number] Patient Account Number: [id number] Date of Birth/Sex: Treating RN: 05/06/1960 (59 y.o. [name] Primary Care Provider: [name] Other Clinician: Referring Provider: Treating Provider/Extender: [name] Weeks in Treatment: 3 Diagnosis Coding ICD-10 Codes Code Description [phone] Type 2 diabetes mellitus with foot ulcer T81.31XA Disruption of external operation (surgical) wound, not elsewhere classified, initial encounter L97.811 Non-pressure chronic ulcer of other part of right lower leg limited to breakdown of skin L97.821 Non-pressure chronic ulcer of other part of left lower leg limited to breakdown of skin S90.812D Abrasion, left foot, subsequent encounter S50.812D Abrasion of left forearm, subsequent encounter 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

## 2019-11-19 NOTE — Telephone Encounter (Signed)
acknowledged

## 2019-11-24 ENCOUNTER — Encounter (HOSPITAL_BASED_OUTPATIENT_CLINIC_OR_DEPARTMENT_OTHER): Payer: Medicaid Other | Admitting: Internal Medicine

## 2019-11-24 ENCOUNTER — Encounter (HOSPITAL_COMMUNITY)
Admission: RE | Admit: 2019-11-24 | Discharge: 2019-11-24 | Disposition: A | Discharge status: Home / Self Care | Payer: Medicaid Other | Source: Ambulatory Visit | Attending: Nephrology | Admitting: Nephrology

## 2019-11-24 ENCOUNTER — Other Ambulatory Visit: Payer: Self-pay

## 2019-11-24 DIAGNOSIS — L97828 Non-pressure chronic ulcer of other part of left lower leg with other specified severity: Secondary | ICD-10-CM | POA: Diagnosis not present

## 2019-11-24 DIAGNOSIS — N183 Chronic kidney disease, stage 3 unspecified: Secondary | ICD-10-CM | POA: Diagnosis not present

## 2019-11-24 DIAGNOSIS — E11621 Type 2 diabetes mellitus with foot ulcer: Secondary | ICD-10-CM | POA: Diagnosis not present

## 2019-11-24 LAB — POCT HEMOGLOBIN-HEMACUE: Hemoglobin: 7.6 g/dL — ABNORMAL LOW (ref 13.0–17.0)

## 2019-11-24 MED ORDER — SODIUM CHLORIDE 0.9 % IV SOLN
510.0000 mg | INTRAVENOUS | Status: DC
  Administered 2019-11-24: 510 mg via INTRAVENOUS
  Filled 2019-11-24: qty 510

## 2019-11-24 MED ORDER — EPOETIN ALFA-EPBX 10000 UNIT/ML IJ SOLN: 20000.0000 [IU] | Freq: Once | INTRAMUSCULAR | Status: AC

## 2019-11-24 MED ORDER — EPOETIN ALFA-EPBX 10000 UNIT/ML IJ SOLN
INTRAMUSCULAR | Status: AC
  Administered 2019-11-24: 20000 [IU] via SUBCUTANEOUS
  Filled 2019-11-24: qty 2

## 2019-11-25 NOTE — Progress Notes (Addendum)
ARUSH, GATLIFF [name] ([id number]) . Visit Report for 11/24/2019 Arrival Information Details Patient Name: Date of Service: Jeffery [name]. 11/24/2019 2:00 PM Medical Record Number: [id number] Patient Account Number: [id number] Date of Birth/Sex: Treating RN: 1960/06/30 (59 y.o. [name] Primary Care Nayda Riesen: [name] Other Clinician: Referring Monta Police: Treating Jashon Ishida/Extender: [name] in Treatment: 4 Visit Information History Since Last Visit Added or deleted any medications: Yes Patient Arrived: Ambulatory Any new allergies or adverse reactions: No Arrival Time: 14:21 Had a fall or experienced change in No Accompanied By: self activities of daily living that may affect Transfer Assistance: None risk of falls: Patient Identification Verified: Yes Signs or symptoms of abuse/neglect since last visito No Secondary Verification Process Completed: Yes Hospitalized since last visit: No Patient Requires Transmission-Based Precautions: No Implantable device outside of the clinic excluding No Patient Has Alerts: No cellular tissue based products placed in the center since last visit: Has Dressing in Place as Prescribed: Yes Has Compression in Place as Prescribed: Yes Pain Present Now: No Electronic Signature(s) Signed: 11/24/2019 6:00:28 PM By: Baruch Gouty RN, BSN Entered By: Baruch Gouty on 11/24/2019 14:27:17 -------------------------------------------------------------------------------- Compression Therapy Details Patient Name: Date of Service: Jeffery [name]. 11/24/2019 2:00 PM Medical Record Number: [id number] Patient Account Number: [id number] Date of Birth/Sex: Treating RN: 31-Jan-1961 (59 y.o. [name] Primary Care Geanine Vandekamp: [name] Other Clinician: Referring Thelda Gagan: Treating Evadne Ose/Extender: [name] Weeks in Treatment: 4 Compression Therapy Performed for  Wound Assessment: Wound #11 Left,Anterior Lower Leg Performed By: Clinician Baruch Gouty, RN Compression Type: Four Layer Post Procedure Diagnosis Same as Pre-procedure Electronic Signature(s) Signed: 11/24/2019 5:56:33 PM By: Kela Millin Entered By: Kela Millin on 11/24/2019 15:28:55 -------------------------------------------------------------------------------- Compression Therapy Details Patient Name: Date of Service: Jeffery [name]. 11/24/2019 2:00 PM Medical Record Number: [id number] Patient Account Number: [id number] Date of Birth/Sex: Treating RN: 16-Jan-1961 (59 y.o. [name] Primary Care Sofie Schendel: [name] Other Clinician: Referring Lem Peary: Treating Naja Apperson/Extender: [name] Weeks in Treatment: 4 Compression Therapy Performed for Wound Assessment: Wound #12 Left,Distal,Anterior Lower Leg Performed By: Clinician Baruch Gouty, RN Compression Type: Four Layer Post Procedure Diagnosis Same as Pre-procedure Electronic Signature(s) Signed: 11/24/2019 5:56:33 PM By: Kela Millin Entered By: Kela Millin on 11/24/2019 15:28:56 -------------------------------------------------------------------------------- Compression Therapy Details Patient Name: Date of Service: Jeffery [name]. 11/24/2019 2:00 PM Medical Record Number: [id number] Patient Account Number: [id number] Date of Birth/Sex: Treating RN: 03-15-1961 (59 y.o. [name] Primary Care Placida Cambre: [name] Other Clinician: Referring Kristel Durkee: Treating Colton Engdahl/Extender: [name] Weeks in Treatment: 4 Compression Therapy Performed for Wound Assessment: Wound #13 Right,Lateral Lower Leg Performed By: Clinician Baruch Gouty, RN Compression Type: Four Layer Post Procedure Diagnosis Same as Pre-procedure Electronic Signature(s) Signed: 11/24/2019 5:56:33 PM By: Kela Millin Entered By:  Kela Millin on 11/24/2019 15:28:56 -------------------------------------------------------------------------------- Compression Therapy Details Patient Name: Date of Service: Jeffery [name]. 11/24/2019 2:00 PM Medical Record Number: [id number] Patient Account Number: [id number] Date of Birth/Sex: Treating RN: 03/15/1961 (59 y.o. [name] Primary Care Carmaleta Youngers: [name] Other Clinician: Referring Markasia Carrol: Treating Majestic Brister/Extender: [name] Weeks in Treatment: 4 Compression Therapy Performed for Wound Assessment: Wound #2 Left,Lateral Lower Leg Performed By: Clinician Baruch Gouty, RN Compression Type: Four Layer Post Procedure Diagnosis Same as Pre-procedure Electronic Signature(s) Signed: 11/24/2019 5:56:33 PM By: Kela Millin Entered By: Kela Millin on 11/24/2019 15:28:56 -------------------------------------------------------------------------------- Compression Therapy Details Patient Name: Date  of Service: Jeffery [name]. 11/24/2019 2:00 PM Medical Record Number: [id number] Patient Account Number: [id number] Date of Birth/Sex: Treating RN: 05/05/1960 (59 y.o. [name] Primary Care Alette Kataoka: [name] Other Clinician: Referring Marquavion Venhuizen: Treating Mckynna Vanloan/Extender: [name] Weeks in Treatment: 4 Compression Therapy Performed for Wound Assessment: Wound #7 Right,Lateral Foot Performed By: Clinician Baruch Gouty, RN Compression Type: Four Layer Post Procedure Diagnosis Same as Pre-procedure Electronic Signature(s) Signed: 11/24/2019 5:56:33 PM By: Kela Millin Entered By: Kela Millin on 11/24/2019 15:28:57 -------------------------------------------------------------------------------- Encounter Discharge Information Details Patient Name: Date of Service: Jeffery [name], Jeffery [name]. 11/24/2019 2:00 PM Medical Record Number: [id number] Patient  Account Number: [id number] Date of Birth/Sex: Treating RN: December 09, 1960 (59 y.o. [name] Primary Care Khoury Siemon: [name] Other Clinician: Referring Eligha Kmetz: Treating Niel Peretti/Extender: [name] in Treatment: 4 Encounter Discharge Information Items Discharge Condition: Stable Ambulatory Status: Ambulatory Discharge Destination: Home Transportation: Private Auto Accompanied By: self Schedule Follow-up Appointment: Yes Clinical Summary of Care: Patient Declined Electronic Signature(s) Signed: 11/24/2019 6:00:28 PM By: Baruch Gouty RN, BSN Entered By: Baruch Gouty on 11/24/2019 15:57:26 -------------------------------------------------------------------------------- Lower Extremity Assessment Details Patient Name: Date of Service: Jeffery [name], Jeffery [name]. 11/24/2019 2:00 PM Medical Record Number: [id number] Patient Account Number: [id number] Date of Birth/Sex: Treating RN: 16-Jul-1960 (60 y.o. [name] Primary Care Jaelen Gellerman: [name] Other Clinician: Referring Jathniel Smeltzer: Treating Keahi Mccarney/Extender: [name] Weeks in Treatment: 4 Edema Assessment Assessed: [Left: No] [Right: No] Edema: [Left: Yes] [Right: Yes] Calf Left: Right: Point of Measurement: 48 cm From Medial Instep 41.9 cm 37 cm Ankle Left: Right: Point of Measurement: 14 cm From Medial Instep 26.8 cm 24.5 cm Vascular Assessment Pulses: Dorsalis Pedis Palpable: [Left:Yes] [Right:Yes] Electronic Signature(s) Signed: 11/24/2019 6:00:28 PM By: Baruch Gouty RN, BSN Entered By: Baruch Gouty on 11/24/2019 14:43:48 -------------------------------------------------------------------------------- Multi Wound Chart Details Patient Name: Date of Service: Jeffery [name], Jeffery [name]. 11/24/2019 2:00 PM Medical Record Number: [id number] Patient Account Number: [id number] Date of Birth/Sex: Treating RN: October 16, 1960 (59 y.o. [name] Primary Care Dorthia Tout: [name] Other Clinician: Referring Adelfa Lozito: Treating Kylee Umana/Extender: [name] Weeks in Treatment: 4 Vital Signs Height(in): 74 Capillary Blood Glucose(mg/dl): 109 Weight(lbs): 345 Pulse(bpm): 63 Body Mass Index(BMI): 59 Blood Pressure(mmHg): 162/81 Temperature(F): 98.2 Respiratory Rate(breaths/min): 20 Photos: [11:No Photos Left, Anterior Lower Leg] [12:No Photos Left, Distal, Anterior Lower Leg] [13:No Photos Right, Lateral Lower Leg] Wound Location: [11:Blister] [12:Blister] [13:Gradually Appeared] Wounding Event: [11:Venous Leg Ulcer] [12:Venous Leg Ulcer] [13:Venous Leg Ulcer] Primary Etiology: [11:Congestive Heart Failure,] [12:Congestive Heart Failure,] [13:Congestive Heart Failure,] Comorbid History: [11:Hypertension, Type II Diabetes 11/17/2019] [12:Hypertension, Type II Diabetes 11/24/2019] [13:Hypertension, Type II Diabetes 11/17/2019] Date Acquired: [11:0] [12:0] [13:0] Weeks of Treatment: [11:Open] [12:Open] [13:Open] Wound Status: [11:No] [12:No] [13:Yes] Clustered Wound: [11:N/A] [12:N/A] [13:3] Clustered Quantity: [11:1.2x1.2x0.1] [12:0.7x1.3x0.1] [13:3x0.3x0.1] Measurements L x W x D (cm) [11:1.131] [12:0.715] [13:0.707] A (cm) : rea [11:0.113] [12:0.071] [13:0.071] Volume (cm) : [11:N/A] [12:N/A] [13:N/A] % Reduction in Area: [11:N/A] [12:N/A] [13:N/A] % Reduction in Volume: [11:Full Thickness Without Exposed] [12:Full Thickness Without Exposed] [13:Full Thickness Without Exposed] Classification: [11:Support Structures Medium] [12:Support Structures Medium] [13:Support Structures Small] Exudate Amount: [11:Serous] [12:Serous] [13:Serous] Exudate Type: [11:amber] [12:amber] [13:amber] Exudate Color: [11:Flat and Intact] [12:Flat and Intact] [13:Flat and Intact] Wound Margin: [11:Large (67-100%)] [12:Large (67-100%)] [13:Large (67-100%)] Granulation Amount: [11:Pink] [12:Pink] [13:Pink] Granulation  Quality: [11:None Present (0%)] [12:None Present (0%)] [13:Small (1-33%)] Necrotic Amount: [11:Fat Layer (Subcutaneous Tissue): Yes Fat Layer (  Subcutaneous Tissue): Yes Fat Layer (Subcutaneous Tissue): Yes] Exposed Structures: [11:Fascia: No Tendon: No Muscle: No Joint: No Bone: No Small (1-33%)] [12:Fascia: No Tendon: No Muscle: No Joint: No Bone: No Small (1-33%)] [13:Fascia: No Tendon: No Muscle: No Joint: No Bone: No Medium (34-66%)] Epithelialization: [11:Compression Therapy] [12:Compression Therapy] [13:Compression Therapy] Wound Number: [id number] 7  Photos: No Photos No Photos No Photos Left, Lateral Lower Leg Right, Medial Lower Leg Right, Lateral Foot Wound Location: Gradually Appeared Gradually Appeared Gradually Appeared Wounding Event: Diabetic Wound/Ulcer of the Lower Diabetic Wound/Ulcer of the Lower Diabetic Wound/Ulcer of the Lower Primary Etiology: Extremity Extremity Extremity Congestive Heart Failure, Congestive Heart Failure, Congestive Heart Failure, Comorbid History: Hypertension, Type II Diabetes Hypertension, Type II Diabetes Hypertension, Type II Diabetes 08/09/2019 08/09/2019 08/02/2019 Date Acquired: 4 4 4  Weeks of Treatment: Open Healed - Epithelialized Open Wound Status: No No No Clustered Wound: N/A N/A N/A Clustered Quantity: 1x3x0.1 0x0x0 3.3x2.3x0.2 Measurements L x W x D (cm) 2.356 0 5.961 A (cm) : rea 0.236 0 1.192 Volume (cm) : -650.30% 100.00% 43.80% % Reduction in A rea: -661.30% 100.00% 83.90% % Reduction in Volume: Grade 2 Grade 2 Grade 2 Classification: Medium Small Medium Exudate A Jeffery: Serous Serous Serosanguineous Exudate Type: amber amber red, Nickolson Exudate Color: Flat and Intact Flat and Intact Flat and Intact Wound Margin: Large (67-100%) None Present (0%) Large (67-100%) Granulation A Jeffery: Pink N/A Red Granulation Quality: None Present (0%) None Present (0%) Small (1-33%) Necrotic A Jeffery: Fat Layer (Subcutaneous  Tissue): Yes Fascia: No Fat Layer (Subcutaneous Tissue): Yes Exposed Structures: Fascia: No Fat Layer (Subcutaneous Tissue): No Fascia: No Tendon: No Tendon: No Tendon: No Muscle: No Muscle: No Muscle: No Joint: No Joint: No Joint: No Bone: No Bone: No Bone: No Small (1-33%) Large (67-100%) Small (1-33%) Epithelialization: Compression Therapy N/A Chemical Cauterization Procedures Performed: Compression Therapy Treatment Notes Wound #11 (Left, Anterior Lower Leg) 2. Periwound Care Moisturizing lotion 3. Primary Dressing Applied Calcium Alginate Ag 4. Secondary Dressing Dry Gauze 6. Support Layer Applied 4 layer compression wrap Wound #12 (Left, Distal, Anterior Lower Leg) 2. Periwound Care Moisturizing lotion 3. Primary Dressing Applied Calcium Alginate Ag 4. Secondary Dressing Dry Gauze 6. Support Layer Applied 4 layer compression wrap Wound #13 (Right, Lateral Lower Leg) 2. Periwound Care Moisturizing lotion 3. Primary Dressing Applied Calcium Alginate Ag 4. Secondary Dressing Dry Gauze 6. Support Layer Applied 4 layer compression wrap Wound #2 (Left, Lateral Lower Leg) 2. Periwound Care Moisturizing lotion 3. Primary Dressing Applied Calcium Alginate Ag 4. Secondary Dressing Dry Gauze 6. Support Layer Applied 4 layer compression wrap Wound #7 (Right, Lateral Foot) 3. Primary Dressing Applied Hydrofera Blue 4. Secondary Dressing ABD Pad 6. Support Layer Applied 4 layer compression Water quality scientist) Signed: 11/26/2019 7:39:18 AM By: Linton Ham MD Signed: 11/27/2019 4:26:23 PM By: Kela Millin Entered By: Linton Ham on 11/25/2019 09:44:41 -------------------------------------------------------------------------------- Multi-Disciplinary Care Plan Details Patient Name: Date of Service: Jeffery [name], Calvin [name]. 11/24/2019 2:00 PM Medical Record Number: [id number] Patient Account Number: [id number] Date of  Birth/Sex: Treating RN: Jul 24, 1960 (59 y.o. [name] Primary Care Soraya Paquette: [name] Other Clinician: Referring Paizlee Kinder: Treating Dewayne Jurek/Extender: [name] Weeks in Treatment: 4 Active Inactive Abuse / Safety / Falls / Self Care Management Nursing Diagnoses: Potential for falls Potential for injury related to falls Goals: Patient will remain injury free related to falls Date Initiated: 10/23/2019 Target Resolution Date: 12/15/2019 Goal Status: Active Patient/caregiver will verbalize/demonstrate measures taken to prevent  injury and/or falls Date Initiated: 10/23/2019 Target Resolution Date: 12/15/2019 Goal Status: Active Interventions: Assess Activities of Daily Living upon admission and as needed Assess fall risk on admission and as needed Assess: immobility, friction, shearing, incontinence upon admission and as needed Assess impairment of mobility on admission and as needed per policy Assess personal safety and home safety (as indicated) on admission and as needed Assess self care needs on admission and as needed Provide education on fall prevention Provide education on personal and home safety Notes: Nutrition Nursing Diagnoses: Impaired glucose control: actual or potential Potential for alteratiion in Nutrition/Potential for imbalanced nutrition Goals: Patient/caregiver agrees to and verbalizes understanding of need to use nutritional supplements and/or vitamins as prescribed Date Initiated: 10/23/2019 Target Resolution Date: 12/15/2019 Goal Status: Active Patient/caregiver will maintain therapeutic glucose control Date Initiated: 10/23/2019 Target Resolution Date: 12/15/2019 Goal Status: Active Interventions: Assess HgA1c results as ordered upon admission and as needed Assess patient nutrition upon admission and as needed per policy Provide education on elevated blood sugars and impact on wound healing Provide education on  nutrition Treatment Activities: Education provided on Nutrition : 11/03/2019 Notes: Venous Leg Ulcer Nursing Diagnoses: Knowledge deficit related to disease process and management Potential for venous Insuffiency (use before diagnosis confirmed) Goals: Patient will maintain optimal edema control Date Initiated: 10/23/2019 Target Resolution Date: 12/15/2019 Goal Status: Active Patient/caregiver will verbalize understanding of disease process and disease management Date Initiated: 10/23/2019 Target Resolution Date: 12/15/2019 Goal Status: Active Interventions: Assess peripheral edema status every visit. Compression as ordered Provide education on venous insufficiency Notes: Wound/Skin Impairment Nursing Diagnoses: Impaired tissue integrity Knowledge deficit related to ulceration/compromised skin integrity Goals: Patient/caregiver will verbalize understanding of skin care regimen Date Initiated: 10/23/2019 Target Resolution Date: 12/15/2019 Goal Status: Active Interventions: Assess patient/caregiver ability to obtain necessary supplies Assess patient/caregiver ability to perform ulcer/skin care regimen upon admission and as needed Assess ulceration(s) every visit Provide education on ulcer and skin care Notes: Electronic Signature(s) Signed: 11/24/2019 5:56:33 PM By: Kela Millin Entered By: Kela Millin on 11/24/2019 15:13:52 -------------------------------------------------------------------------------- Pain Assessment Details Patient Name: Date of Service: Jeffery [name]. 11/24/2019 2:00 PM Medical Record Number: [id number] Patient Account Number: [id number] Date of Birth/Sex: Treating RN: Aug 17, 1960 (59 y.o. [name] Primary Care Vicktoria Muckey: [name] Other Clinician: Referring Carmelo Reidel: Treating Johnmatthew Solorio/Extender: [name] Weeks in Treatment: 4 Active Problems Location of Pain Severity and Description of  Pain Patient Has Paino No Site Locations Rate the pain. Current Pain Level: 0 Pain Management and Medication Current Pain Management: Electronic Signature(s) Signed: 11/24/2019 6:00:28 PM By: Baruch Gouty RN, BSN Entered By: Baruch Gouty on 11/24/2019 14:42:31 -------------------------------------------------------------------------------- Patient/Caregiver Education Details Patient Name: Date of Service: Jeffery [name] 8/27/2021andnbsp2:00 PM Medical Record Number: [id number] Patient Account Number: [id number] Date of Birth/Gender: Treating RN: 1960-08-03 (59 y.o. [name] Primary Care Physician: [name] Other Clinician: Referring Physician: Treating Physician/Extender: [name] in Treatment: 4 Education Assessment Education Provided To: Patient Education Topics Provided Elevated Blood Sugar/ Impact on Healing: Handouts: Elevated Blood Sugars: How Do They Affect Wound Healing Methods: Explain/Verbal Responses: State content correctly Wound/Skin Impairment: Handouts: Caring for Your Ulcer Methods: Explain/Verbal Responses: State content correctly Electronic Signature(s) Signed: 11/24/2019 5:56:33 PM By: Kela Millin Entered By: Kela Millin on 11/24/2019 15:14:10 -------------------------------------------------------------------------------- Wound Assessment Details Patient Name: Date of Service: Jeffery [name]. 11/24/2019 2:00 PM Medical Record Number: [id number] Patient Account Number: [id number] Date of Birth/Sex: Treating RN: 09-Jul-1960 (58  y.o. [name] Primary Care Arla Boutwell: [name] Other Clinician: Referring Kymberley Raz: Treating Cheronda Erck/Extender: [name] Weeks in Treatment: 4 Wound Status Wound Number: 11 Primary Etiology: Venous Leg Ulcer Wound Location: Left, Anterior Lower Leg Wound Status: Open Wounding Event: Blister Comorbid  History: Congestive Heart Failure, Hypertension, Type II Diabetes Date Acquired: 11/17/2019 Weeks Of Treatment: 0 Clustered Wound: No Photos Photo Uploaded By: Mikeal Hawthorne on 11/27/2019 14:22:48 Wound Measurements Length: (cm) 1.2 Width: (cm) 1.2 Depth: (cm) 0.1 Area: (cm) 1.131 Volume: (cm) 0.113 % Reduction in Area: % Reduction in Volume: Epithelialization: Small (1-33%) Tunneling: No Undermining: No Wound Description Classification: Full Thickness Without Exposed Support Structures Wound Margin: Flat and Intact Exudate Amount: Medium Exudate Type: Serous Exudate Color: amber Foul Odor After Cleansing: No Slough/Fibrino Yes Wound Bed Granulation Amount: Large (67-100%) Exposed Structure Granulation Quality: Pink Fascia Exposed: No Necrotic Amount: None Present (0%) Fat Layer (Subcutaneous Tissue) Exposed: Yes Tendon Exposed: No Muscle Exposed: No Joint Exposed: No Bone Exposed: No Treatment Notes Wound #11 (Left, Anterior Lower Leg) 2. Periwound Care Moisturizing lotion 3. Primary Dressing Applied Calcium Alginate Ag 4. Secondary Dressing Dry Gauze 6. Support Layer Applied 4 layer compression Water quality scientist) Signed: 11/24/2019 6:00:28 PM By: Baruch Gouty RN, BSN Entered By: Baruch Gouty on 11/24/2019 14:54:26 -------------------------------------------------------------------------------- Wound Assessment Details Patient Name: Date of Service: Jeffery [name]. 11/24/2019 2:00 PM Medical Record Number: [id number] Patient Account Number: [id number] Date of Birth/Sex: Treating RN: April 11, 1960 (59 y.o. [name] Primary Care Darryn Kydd: [name] Other Clinician: Referring Shamila Lerch: Treating Drago Hammonds/Extender: [name] Weeks in Treatment: 4 Wound Status Wound Number: 12 Primary Etiology: Venous Leg Ulcer Wound Location: Left, Distal, Anterior Lower Leg Wound Status: Open Wounding Event:  Blister Comorbid History: Congestive Heart Failure, Hypertension, Type II Diabetes Date Acquired: 11/24/2019 Weeks Of Treatment: 0 Clustered Wound: No Photos Photo Uploaded By: Mikeal Hawthorne on 11/27/2019 14:22:49 Wound Measurements Length: (cm) 0.7 Width: (cm) 1.3 Depth: (cm) 0.1 Area: (cm) 0.715 Volume: (cm) 0.071 % Reduction in Area: % Reduction in Volume: Epithelialization: Small (1-33%) Tunneling: No Undermining: No Wound Description Classification: Full Thickness Without Exposed Support Structures Wound Margin: Flat and Intact Exudate Amount: Medium Exudate Type: Serous Exudate Color: amber Foul Odor After Cleansing: No Slough/Fibrino No Wound Bed Granulation Amount: Large (67-100%) Exposed Structure Granulation Quality: Pink Fascia Exposed: No Necrotic Amount: None Present (0%) Fat Layer (Subcutaneous Tissue) Exposed: Yes Tendon Exposed: No Muscle Exposed: No Joint Exposed: No Bone Exposed: No Treatment Notes Wound #12 (Left, Distal, Anterior Lower Leg) 2. Periwound Care Moisturizing lotion 3. Primary Dressing Applied Calcium Alginate Ag 4. Secondary Dressing Dry Gauze 6. Support Layer Applied 4 layer compression Water quality scientist) Signed: 11/24/2019 6:00:28 PM By: Baruch Gouty RN, BSN Entered By: Baruch Gouty on 11/24/2019 14:55:28 -------------------------------------------------------------------------------- Wound Assessment Details Patient Name: Date of Service: Jeffery [name]. 11/24/2019 2:00 PM Medical Record Number: [id number] Patient Account Number: [id number] Date of Birth/Sex: Treating RN: Apr 02, 1960 (59 y.o. [name] Primary Care Cariann Kinnamon: [name] Other Clinician: Referring Acsa Estey: Treating Yaresly Menzel/Extender: [name] Weeks in Treatment: 4 Wound Status Wound Number: 13 Primary Etiology: Venous Leg Ulcer Wound Location: Right, Lateral Lower Leg Wound Status:  Open Wounding Event: Gradually Appeared Comorbid History: Congestive Heart Failure, Hypertension, Type II Diabetes Date Acquired: 11/17/2019 Weeks Of Treatment: 0 Clustered Wound: Yes Photos Photo Uploaded By: Mikeal Hawthorne on 11/27/2019 14:24:07 Wound Measurements Length: (cm) 3 Width: (cm) 0.3 Depth: (cm) 0.1  Clustered Quantity: 3 Area: (cm) 0. Volume: (cm) 0. % Reduction in Area: % Reduction in Volume: Epithelialization: Medium (34-66%) Tunneling: No 707 Undermining: No 071 Wound Description Classification: Full Thickness Without Exposed Support Structures Wound Margin: Flat and Intact Exudate Amount: Small Exudate Type: Serous Exudate Color: amber Foul Odor After Cleansing: No Slough/Fibrino Yes Wound Bed Granulation Amount: Large (67-100%) Exposed Structure Granulation Quality: Pink Fascia Exposed: No Necrotic Amount: Small (1-33%) Fat Layer (Subcutaneous Tissue) Exposed: Yes Necrotic Quality: Adherent Slough Tendon Exposed: No Muscle Exposed: No Joint Exposed: No Bone Exposed: No Treatment Notes Wound #13 (Right, Lateral Lower Leg) 2. Periwound Care Moisturizing lotion 3. Primary Dressing Applied Calcium Alginate Ag 4. Secondary Dressing Dry Gauze 6. Support Layer Applied 4 layer compression Water quality scientist) Signed: 11/24/2019 6:00:28 PM By: Baruch Gouty RN, BSN Entered By: Baruch Gouty on 11/24/2019 14:57:18 -------------------------------------------------------------------------------- Wound Assessment Details Patient Name: Date of Service: Jeffery [name]. 11/24/2019 2:00 PM Medical Record Number: [id number] Patient Account Number: [id number] Date of Birth/Sex: Treating RN: 05/01/1960 (59 y.o. [name] Primary Care Tina Gruner: [name] Other Clinician: Referring Chareese Sergent: Treating Toney Lizaola/Extender: [name] Weeks in Treatment: 4 Wound Status Wound Number: 2 Primary Etiology:  Diabetic Wound/Ulcer of the Lower Extremity Wound Location: Left, Lateral Lower Leg Wound Status: Open Wounding Event: Gradually Appeared Comorbid History: Congestive Heart Failure, Hypertension, Type II Diabetes Date Acquired: 08/09/2019 Weeks Of Treatment: 4 Clustered Wound: No Photos Photo Uploaded By: Mikeal Hawthorne on 11/27/2019 14:22:14 Wound Measurements Length: (cm) 1 Width: (cm) 3 Depth: (cm) 0.1 Area: (cm) 2.356 Volume: (cm) 0.236 % Reduction in Area: -650.3% % Reduction in Volume: -661.3% Epithelialization: Small (1-33%) Tunneling: No Undermining: No Wound Description Classification: Grade 2 Wound Margin: Flat and Intact Exudate Amount: Medium Exudate Type: Serous Exudate Color: amber Foul Odor After Cleansing: No Slough/Fibrino No Wound Bed Granulation Amount: Large (67-100%) Exposed Structure Granulation Quality: Pink Fascia Exposed: No Necrotic Amount: None Present (0%) Fat Layer (Subcutaneous Tissue) Exposed: Yes Tendon Exposed: No Muscle Exposed: No Joint Exposed: No Bone Exposed: No Treatment Notes Wound #2 (Left, Lateral Lower Leg) 2. Periwound Care Moisturizing lotion 3. Primary Dressing Applied Calcium Alginate Ag 4. Secondary Dressing Dry Gauze 6. Support Layer Applied 4 layer compression Water quality scientist) Signed: 11/24/2019 6:00:28 PM By: Baruch Gouty RN, BSN Entered By: Baruch Gouty on 11/24/2019 14:59:55 -------------------------------------------------------------------------------- Wound Assessment Details Patient Name: Date of Service: Jeffery [name]. 11/24/2019 2:00 PM Medical Record Number: [id number] Patient Account Number: [id number] Date of Birth/Sex: Treating RN: February 16, 1961 (59 y.o. [name] Primary Care Jadon Ressler: [name] Other Clinician: Referring Hezzie Karim: Treating Layann Bluett/Extender: [name] Weeks in Treatment: 4 Wound Status Wound Number: 5 Primary  Etiology: Diabetic Wound/Ulcer of the Lower Extremity Wound Location: Right, Medial Lower Leg Wound Status: Healed - Epithelialized Wounding Event: Gradually Appeared Comorbid History: Congestive Heart Failure, Hypertension, Type II Diabetes Date Acquired: 08/09/2019 Weeks Of Treatment: 4 Clustered Wound: No Photos Photo Uploaded By: Mikeal Hawthorne on 11/27/2019 14:20:20 Wound Measurements Length: (cm) Width: (cm) Depth: (cm) Area: (cm) Volume: (cm) 0 % Reduction in Area: 100% 0 % Reduction in Volume: 100% 0 Epithelialization: Large (67-100%) 0 Tunneling: No 0 Undermining: No Wound Description Classification: Grade 2 Wound Margin: Flat and Intact Exudate Amount: Small Exudate Type: Serous Exudate Color: amber Foul Odor After Cleansing: No Slough/Fibrino No Wound Bed Granulation Amount: None Present (0%) Exposed Structure Necrotic Amount: None Present (0%) Fascia Exposed: No Fat Layer (Subcutaneous Tissue) Exposed: No  Tendon Exposed: No Muscle Exposed: No Joint Exposed: No Bone Exposed: No Electronic Signature(s) Signed: 11/24/2019 6:00:28 PM By: Baruch Gouty RN, BSN Entered By: Baruch Gouty on 11/24/2019 14:50:07 -------------------------------------------------------------------------------- Wound Assessment Details Patient Name: Date of Service: Jeffery [name]. 11/24/2019 2:00 PM Medical Record Number: [id number] Patient Account Number: [id number] Date of Birth/Sex: Treating RN: [birth year] (59 y.o. [name] Primary Care Sharday Michl: [name] Other Clinician: Referring Cela Newcom: Treating Nyellie Yetter/Extender: [name] Weeks in Treatment: 4 Wound Status Wound Number: 7 Primary Etiology: Diabetic Wound/Ulcer of the Lower Extremity Wound Location: Right, Lateral Foot Wound Status: Open Wounding Event: Gradually Appeared Comorbid History: Congestive Heart Failure, Hypertension, Type II Diabetes Date Acquired:  08/02/2019 Weeks Of Treatment: 4 Clustered Wound: No Photos Photo Uploaded By: Mikeal Hawthorne on 11/27/2019 14:20:20 Wound Measurements Length: (cm) 3.3 Width: (cm) 2.3 Depth: (cm) 0.2 Area: (cm) 5.961 Volume: (cm) 1.192 % Reduction in Area: 43.8% % Reduction in Volume: 83.9% Epithelialization: Small (1-33%) Tunneling: No Undermining: No Wound Description Classification: Grade 2 Wound Margin: Flat and Intact Exudate Amount: Medium Exudate Type: Serosanguineous Exudate Color: red, Basey Foul Odor After Cleansing: No Slough/Fibrino Yes Wound Bed Granulation Amount: Large (67-100%) Exposed Structure Granulation Quality: Red Fascia Exposed: No Necrotic Amount: Small (1-33%) Fat Layer (Subcutaneous Tissue) Exposed: Yes Necrotic Quality: Adherent Slough Tendon Exposed: No Muscle Exposed: No Joint Exposed: No Bone Exposed: No Treatment Notes Wound #7 (Right, Lateral Foot) 3. Primary Dressing Applied Hydrofera Blue 4. Secondary Dressing ABD Pad 6. Support Layer Applied 4 layer compression Water quality scientist) Signed: 11/24/2019 6:00:28 PM By: Baruch Gouty RN, BSN Entered By: Baruch Gouty on 11/24/2019 15:00:14 -------------------------------------------------------------------------------- [place] Details Patient Name: Date of Service: Jeffery [name], [place] [name]. 11/24/2019 2:00 PM Medical Record Number: [id number] Patient Account Number: [id number] Date of Birth/Sex: Treating RN: 06/08/1960 (59 y.o. [name] Primary Care Calix Heinbaugh: [name] Other Clinician: Referring Eddi Hymes: Treating Lamario Mani/Extender: [name] Weeks in Treatment: 4 Vital Signs Time Taken: 14:27 Temperature (F): 98.2 Height (in): 74 Pulse (bpm): 90 Source: Stated Respiratory Rate (breaths/min): 20 Weight (lbs): 345 Blood Pressure (mmHg): 162/81 Source: Stated Capillary Blood Glucose (mg/dl): 109 Body Mass Index (BMI): 44.3 Reference  Range: 80 - 120 mg / dl Notes glucose per pt report this am Electronic Signature(s) Signed: 11/24/2019 6:00:28 PM By: Baruch Gouty RN, BSN Entered By: Baruch Gouty on 11/24/2019 14:42:19

## 2019-11-26 NOTE — Progress Notes (Addendum)
Jeffery [name] ([id number]) . Visit Report for 11/24/2019 HPI Details Patient Name: Date of Service: Jeffery [name]. 11/24/2019 2:00 PM Medical Record Number: [id number] Patient Account Number: [id number] Date of Birth/Sex: Treating RN: 1961/02/19 (59 y.o. Jeffery [name] Primary Care Provider: [name] Other Clinician: Referring Provider: Treating Provider/Extender: [name] Weeks in Treatment: 4 History of Present Illness HPI Description: ADMISSION 10/23/2019 This is a complex 59 year old man who is here with wounds on his bilateral lower legs feet and abrasion on his left forearm. These are generally of different etiology. He is a type II diabetic with peripheral neuropathy but does not have known PAD. Looking through [facility] health link I can see he was being followed by Dr. Earleen Newport earlier this year for a diabetic ulcer on the right foot. An MRI done in May showed no osteomyelitis. Shortly thereafter he developed cellulitis of the right leg and foot and was hospitalized from 5/10 through 5/19 with MSSA sepsis. During this hospitalization he was felt to have osteomyelitis he had debridement twice of the right foot and on one occasion apparently a placement of ACell. He again was hospitalized from 6/10 through 7/1 again with left lower extremity cellulitis increasing edema and acute renal failure. An MRI showed left leg cellulitis but no other major findings. 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 He was discharged with multiple wounds on his legs which apparently started as blisters secondary to fluid overload. His creatinine on 7/5 was 7.49 potassium 4 BUN 116 CO2 of 23 albumin of 2.9. He was seen by Dr. Moshe Cipro of nephrology medications were adjusted she is following him. 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 The patient arrives in clinic today  with multiple wounds of different etiologies on his bilateral lower extremities. On the right leg he has superficial areas on the right medial and lateral.. On his right lateral foot I think the original surgical wound. There is a small area at the base of his left second toe. On the left lower extremity a fairly large wound on the left second toe which was trauma from the shower door. He has several areas on the legs. Which are superficial. Finally he has an abrasion injury on his left arm/skin tear Past medical history includes Charcot foot. Left third toe amputation, type 2 diabetes with peripheral neuropathy, paroxysmal A. fib on Eliquis, diastolic heart failure, acute on chronic renal failure as described. ABI in our clinic was 1.18 on the right 1.19 on the left 11/03/2019 upon evaluation today patient appears to be doing well with regard to his ulcers in general. He is making good progress. The worst is his larger right lateral foot ulcer. This wound is going require some sharp debridement today. Other than that he seems to be doing quite well. 11/10/19-Patient has denuded areas on both legs especially the right where there is more stasis changes and friable skin, areas on his right leg appear to be bigger than before, right lateral foot wound appears about the same. We are using Hydrofera Blue to the foot wound and silver alginate on the rest with 4 layer compression 8/19; patient's wounds on his bilateral anterior lower legs look a lot better. These appear to be progressing towards healing and some of them actually have healed. The most substantial area is on the right lateral foot. I thought this was his original surgical wound site. This  has raised hyper granulation tissue and a completely nonviable surface. We have been using silver alginate to all the wounds on his legs under compression and Hydrofera Blue on the right lateral 8/27; most of the wounds are improving here except for the one  on the right lateral foot. Still hyper granulation were using Hydrofera Blue in this area.he does not have an arterial issue. The wound on the right lateral foot I think was an original surgical wound. I wonder whether he inverts at the ankle and not to complicate healing this. In review E does not have an arterial issue.MRI of the foot did not show osteomyelitis in May He still has several open wounds on the bilateral lower legs although these are a lot better. We are using silver alginate under for R compression Electronic Signature(s) Signed: 11/26/2019 7:39:18 AM By: Linton Ham MD Entered By: Linton Ham on 11/25/2019 09:56:47 -------------------------------------------------------------------------------- Chemical Cauterization Details Patient Name: Date of Service: Jeffery [name]. 11/24/2019 2:00 PM Medical Record Number: [id number] Patient Account Number: [id number] Date of Birth/Sex: Treating RN: [birth year] (59 y.o. Jeffery [name] Primary Care Provider: [name] Other Clinician: Referring Provider: Treating Provider/Extender: [name] Weeks in Treatment: 4 Procedure Performed for: Wound #7 Right,Lateral Foot Performed By: Physician Ricard Dillon., MD Post Procedure Diagnosis Same as Pre-procedure Electronic Signature(s) Signed: 11/26/2019 7:39:18 AM By: Linton Ham MD Previous Signature: 11/24/2019 5:56:33 PM Version By: Kela Millin Entered By: Linton Ham on 11/25/2019 09:45:25 -------------------------------------------------------------------------------- Physical Exam Details Patient Name: Date of Service: Jeffery [name]. 11/24/2019 2:00 PM Medical Record Number: [id number] Patient Account Number: [id number] Date of Birth/Sex: Treating RN: 1961-01-25 (59 y.o. Jeffery [name] Primary Care Provider: [name] Other Clinician: Referring Provider: Treating Provider/Extender: [name] Weeks in Treatment: 4 Constitutional Patient is hypertensive.. Pulse regular and within target range for patient.Marland Kitchen Respirations regular, non-labored and within target range.. Temperature is normal and within the target range for the patient.Marland Kitchen Appears in no distress. Cardiovascular Pedal pulses palpable and strong bilaterally.. Notes wound exam; most the wounds on his legs look like they're contracting and I suspect are on their way to heealing. Still most concerning area is on the right lateral foot still hyper granulated. We used silver nitrate on this area. In general the surface is looking somewhat better Electronic Signature(s) Signed: 11/26/2019 7:39:18 AM By: Linton Ham MD Entered By: Linton Ham on 11/25/2019 10:02:38 -------------------------------------------------------------------------------- Physician Orders Details Patient Name: Date of Service: Jeffery [name], [place] [name]. 11/24/2019 2:00 PM Medical Record Number: [id number] Patient Account Number: [id number] Date of Birth/Sex: Treating RN: 1961/01/02 (59 y.o. Jeffery [name] Primary Care Provider: [name] Other Clinician: Referring Provider: Treating Provider/Extender: [name] Weeks in Treatment: 4 Verbal / Phone Orders: No Diagnosis Coding ICD-10 Coding Code Description E11.621 Type 2 diabetes mellitus with foot ulcer T81.31XA Disruption of external operation (surgical) wound, not elsewhere classified, initial encounter L97.811 Non-pressure chronic ulcer of other part of right lower leg limited to breakdown of skin L97.821 Non-pressure chronic ulcer of other part of left lower leg limited to breakdown of skin S90.812D Abrasion, left foot, subsequent encounter S50.812D Abrasion of left forearm, subsequent encounter E11.22 Type 2 diabetes mellitus with diabetic chronic kidney disease L97.512 Non-pressure chronic ulcer of other part of right foot with fat  layer exposed Follow-up Appointments Return Appointment in 1 week. Dressing Change Frequency Change dressing three times week. - all wounds (wound clinic to change on Friday,  home health to change on Monday and Wednesday) Skin Barriers/Peri-Wound Care Moisturizing lotion - both legs Wound Cleansing Clean wound with Normal Saline. - or wound cleanser Primary Wound Dressing Wound #11 Left,Anterior Lower Leg Calcium Alginate with Silver Wound #12 Left,Distal,Anterior Lower Leg Calcium Alginate with Silver Wound #13 Right,Lateral Lower Leg Calcium Alginate with Silver Wound #2 Left,Lateral Lower Leg Calcium Alginate with Silver Wound #7 Right,Lateral Foot Hydrofera Blue - Classic Secondary Dressing Dry Gauze - all wounds ABD pad - all wounds on legs Edema Control 4 layer compression - Bilateral - unna boot to top to anchor Avoid standing for long periods of time Elevate legs to the level of the heart or above for 30 minutes daily and/or when sitting, a frequency of: - throughout the day Exercise regularly [place] skilled [facility] for wound care. - [facility] Electronic Signature(s) Signed: 11/24/2019 5:56:33 PM By: Kela Millin Signed: 11/26/2019 7:39:18 AM By: Linton Ham MD Entered By: Kela Millin on 11/24/2019 15:30:03 -------------------------------------------------------------------------------- Problem List Details Patient Name: Date of Service: Jeffery [name], [place] [name]. 11/24/2019 2:00 PM Medical Record Number: [id number] Patient Account Number: [id number] Date of Birth/Sex: Treating RN: 11-26-1960 (59 y.o. Jeffery [name] Primary Care Provider: [name] Other Clinician: Referring Provider: Treating Provider/Extender: [name] Weeks in Treatment: 4 Active Problems ICD-10 Encounter Code Description Active Date MDM Diagnosis E11.621 Type 2 diabetes mellitus with foot ulcer 10/23/2019 No  Yes T81.31XA Disruption of external operation (surgical) wound, not elsewhere classified, 10/23/2019 No Yes initial encounter L97.811 Non-pressure chronic ulcer of other part of right lower leg limited to breakdown 10/23/2019 No Yes of skin L97.821 Non-pressure chronic ulcer of other part of left lower leg limited to breakdown 10/23/2019 No Yes of skin S90.812D Abrasion, left foot, subsequent encounter 10/23/2019 No Yes S50.812D Abrasion of left forearm, subsequent encounter 10/23/2019 No Yes E11.22 Type 2 diabetes mellitus with diabetic chronic kidney disease 10/23/2019 No Yes L97.512 Non-pressure chronic ulcer of other part of right foot with fat layer exposed 11/03/2019 No Yes Inactive Problems Resolved Problems Electronic Signature(s) Signed: 11/26/2019 7:39:18 AM By: Linton Ham MD Previous Signature: 11/24/2019 5:56:33 PM Version By: Kela Millin Entered By: Linton Ham on 11/25/2019 09:38:34 -------------------------------------------------------------------------------- Progress Note Details Patient Name: Date of Service: Jeffery [name], [place] [name]. 11/24/2019 2:00 PM Medical Record Number: [id number] Patient Account Number: [id number] Date of Birth/Sex: Treating RN: 06/07/1960 (59 y.o. Jeffery [name] Primary Care Provider: [name] Other Clinician: Referring Provider: Treating Provider/Extender: [name] Weeks in Treatment: 4 Subjective History of Present Illness (HPI) ADMISSION 10/23/2019 This is a complex 59 year old man who is here with wounds on his bilateral lower legs feet and abrasion on his left forearm. These are generally of different etiology. He is a type II diabetic with peripheral neuropathy but does not have known PAD. Looking through [facility] health link I can see he was being followed by Dr. Earleen Newport earlier this year for a diabetic ulcer on the right foot. An MRI done in May showed no osteomyelitis. Shortly thereafter he developed  cellulitis of the right leg and foot and was hospitalized from 5/10 through 5/19 with MSSA sepsis. During this hospitalization he was felt to have osteomyelitis he had debridement twice of the right foot and on one occasion apparently a placement of ACell. He again was hospitalized from 6/10 through 7/1 again with left lower extremity cellulitis increasing edema and acute renal failure. An MRI showed left leg cellulitis but no other major  findings. 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 He was discharged with multiple wounds on his legs which apparently started as blisters secondary to fluid overload. His creatinine on 7/5 was 7.49 potassium 4 BUN 116 CO2 of 23 albumin of 2.9. He was seen by Dr. Moshe Cipro of nephrology medications were adjusted she is following him. 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 The patient arrives in clinic today with multiple wounds of different etiologies on his bilateral lower extremities. ooOn the right leg he has superficial areas on the right medial and lateral.. On his right lateral foot I think the original surgical wound. There is a small area at the base of his left second toe. ooOn the left lower extremity a fairly large wound on the left second toe which was trauma from the shower door. He has several areas on the legs. Which are superficial. ooFinally he has an abrasion injury on his left arm/skin tear Past medical history includes Charcot foot. Left third toe amputation, type 2 diabetes with peripheral neuropathy, paroxysmal A. fib on Eliquis, diastolic heart failure, acute on chronic renal failure as described. ABI in our clinic was 1.18 on the right 1.19 on the left 11/03/2019 upon evaluation today patient appears to be doing well with regard to his ulcers in general. He is making good progress. The worst is his larger right lateral foot ulcer. This wound is  going require some sharp debridement today. Other than that he seems to be doing quite well. 11/10/19-Patient has denuded areas on both legs especially the right where there is more stasis changes and friable skin, areas on his right leg appear to be bigger than before, right lateral foot wound appears about the same. We are using Hydrofera Blue to the foot wound and silver alginate on the rest with 4 layer compression 8/19; patient's wounds on his bilateral anterior lower legs look a lot better. These appear to be progressing towards healing and some of them actually have healed. The most substantial area is on the right lateral foot. I thought this was his original surgical wound site. This has raised hyper granulation tissue and a completely nonviable surface. We have been using silver alginate to all the wounds on his legs under compression and Hydrofera Blue on the right lateral 8/27; most of the wounds are improving here except for the one on the right lateral foot. Still hyper granulation were using Hydrofera Blue in this area.he does not have an arterial issue. The wound on the right lateral foot I think was an original surgical wound. I wonder whether he inverts at the ankle and not to complicate healing this. In review E does not have an arterial issue.MRI of the foot did not show osteomyelitis in May He still has several open wounds on the bilateral lower legs although these are a lot better. We are using silver alginate under for R compression Objective Constitutional Patient is hypertensive.. Pulse regular and within target range for patient.Marland Kitchen Respirations regular, non-labored and within target range.. Temperature is normal and within the target range for the patient.Marland Kitchen Appears in no distress. Vitals Time Taken: 2:27 PM, Height: 74 in, Source: Stated, Weight: 345 lbs, Source: Stated, BMI: 44.3, Temperature: 98.2 F, Pulse: 90 bpm, Respiratory Rate: 20 breaths/min, Blood Pressure: 162/81  mmHg, Capillary Blood Glucose: 109 mg/dl. General Notes: glucose per pt report this am Cardiovascular Pedal pulses palpable and strong bilaterally.. General  Notes: wound exam; most the wounds on his legs look like they're contracting and I suspect are on their way to heealing. ooStill most concerning area is on the right lateral foot still hyper granulated. We used silver nitrate on this area. In general the surface is looking somewhat better Integumentary (Hair, Skin) Wound #11 status is Open. Original cause of wound was Blister. The wound is located on the Left,Anterior Lower Leg. The wound measures 1.2cm length x 1.2cm width x 0.1cm depth; 1.131cm^2 area and 0.113cm^3 volume. There is Fat Layer (Subcutaneous Tissue) exposed. There is no tunneling or undermining noted. There is a medium amount of serous drainage noted. The wound margin is flat and intact. There is large (67-100%) pink granulation within the wound bed. There is no necrotic tissue within the wound bed. Wound #12 status is Open. Original cause of wound was Blister. The wound is located on the [facility] Lower Leg. The wound measures 0.7cm length x 1.3cm width x 0.1cm depth; 0.715cm^2 area and 0.071cm^3 volume. There is Fat Layer (Subcutaneous Tissue) exposed. There is no tunneling or undermining noted. There is a medium amount of serous drainage noted. The wound margin is flat and intact. There is large (67-100%) pink granulation within the wound bed. There is no necrotic tissue within the wound bed. Wound #13 status is Open. Original cause of wound was Gradually Appeared. The wound is located on the Right,Lateral Lower Leg. The wound measures 3cm length x 0.3cm width x 0.1cm depth; 0.707cm^2 area and 0.071cm^3 volume. There is Fat Layer (Subcutaneous Tissue) exposed. There is no tunneling or undermining noted. There is a small amount of serous drainage noted. The wound margin is flat and intact. There is large (67-100%)  pink granulation within the wound bed. There is a small (1-33%) amount of necrotic tissue within the wound bed including Adherent Slough. Wound #2 status is Open. Original cause of wound was Gradually Appeared. The wound is located on the Left,Lateral Lower Leg. The wound measures 1cm length x 3cm width x 0.1cm depth; 2.356cm^2 area and 0.236cm^3 volume. There is Fat Layer (Subcutaneous Tissue) exposed. There is no tunneling or undermining noted. There is a medium amount of serous drainage noted. The wound margin is flat and intact. There is large (67-100%) pink granulation within the wound bed. There is no necrotic tissue within the wound bed. Wound #5 status is Healed - Epithelialized. Original cause of wound was Gradually Appeared. The wound is located on the Right,Medial Lower Leg. The wound measures 0cm length x 0cm width x 0cm depth; 0cm^2 area and 0cm^3 volume. There is no tunneling or undermining noted. There is a small amount of serous drainage noted. The wound margin is flat and intact. There is no granulation within the wound bed. There is no necrotic tissue within the wound bed. Wound #7 status is Open. Original cause of wound was Gradually Appeared. The wound is located on the Right,Lateral Foot. The wound measures 3.3cm length x 2.3cm width x 0.2cm depth; 5.961cm^2 area and 1.192cm^3 volume. There is Fat Layer (Subcutaneous Tissue) exposed. There is no tunneling or undermining noted. There is a medium amount of serosanguineous drainage noted. The wound margin is flat and intact. There is large (67-100%) red granulation within the wound bed. There is a small (1-33%) amount of necrotic tissue within the wound bed including Adherent Slough. 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 There was a Four Layer Compression Therapy Procedure by Baruch Gouty, RN. Post procedure Diagnosis Wound #11: Same as Pre-Procedure Wound #12 Pre-procedure diagnosis of Wound #12 is a Venous Leg Ulcer located on the Left,Distal,Anterior Lower Leg . There was a Four Layer Compression Therapy Procedure by Baruch Gouty, RN. Post procedure Diagnosis Wound #12: Same as Pre-Procedure Wound #13 Pre-procedure diagnosis of Wound #13 is a Venous Leg Ulcer located on the Right,Lateral Lower Leg . There was a Four Layer Compression Therapy Procedure by Baruch Gouty, RN. Post procedure Diagnosis Wound #13: Same as Pre-Procedure Wound #2 Pre-procedure diagnosis of Wound #2 is a Diabetic Wound/Ulcer of the Lower Extremity located on the Left,Lateral Lower Leg . There was a Four Layer Compression Therapy Procedure by Baruch Gouty, RN. Post procedure Diagnosis Wound #2: Same as Pre-Procedure Wound #7 Pre-procedure diagnosis of Wound #7 is a Diabetic Wound/Ulcer of the Lower Extremity located on the Right,Lateral Foot . There was a Four Layer Compression Therapy Procedure by Baruch Gouty, RN. Post procedure Diagnosis Wound #7: Same as Pre-Procedure Pre-procedure diagnosis of Wound #7 is a Diabetic Wound/Ulcer of the Lower Extremity located on the Right,Lateral Foot . An Chemical Cauterization procedure was performed by Ricard Dillon., MD. Post procedure Diagnosis Wound #7: Same as Pre-Procedure Plan Follow-up Appointments: Return Appointment in 1  week. Dressing Change Frequency: Change dressing three times week. - all wounds (wound clinic to change on Friday, home health to change on Monday and Wednesday) Skin Barriers/Peri-Wound Care: Moisturizing lotion - both legs Wound Cleansing: Clean wound with Normal Saline. - or wound cleanser Primary Wound Dressing: Wound #11 Left,Anterior Lower Leg: Calcium Alginate with Silver Wound #12 Left,Distal,Anterior Lower Leg: Calcium Alginate with Silver Wound #13 Right,Lateral Lower Leg: Calcium Alginate with Silver Wound #2 Left,Lateral Lower Leg: Calcium Alginate with Silver Wound #7 Right,Lateral Foot: Hydrofera Blue - Classic Secondary Dressing: Dry Gauze - all wounds ABD pad - all wounds on legs Edema Control: 4 layer compression - Bilateral - unna boot to top to anchor Avoid standing for long periods of time Elevate legs to the level of the heart or above for 30 minutes daily and/or when sitting, a frequency of: - throughout the day Exercise regularly Home Health: [place] skilled [facility] for wound care. - [facility] #1 continuous over alternateon the wounds of both legs under for liver compression. We have good edema control #2on using Hydrofera Blue on the right lateral foot. Last week we did debridement of hyperventilation this week silver nitrate. #3 overall making progress Electronic Signature(s) Signed: 11/26/2019 7:39:18 AM By: Linton Ham MD Entered By: Linton Ham on 11/25/2019 10:04:31 -------------------------------------------------------------------------------- SuperBill Details Patient Name: Date of Service: Rolland Porter [name]. 11/24/2019 Medical Record Number: [id number] Patient Account Number: [id number] Date of Birth/Sex: Treating RN: June 07, 1960 (59 y.o. Jeffery [name] Primary Care Provider: [name] Other Clinician: Referring Provider: Treating Provider/Extender: [name] Weeks in Treatment:  4 Diagnosis Coding ICD-10 Codes Code Description [phone] Type 2 diabetes mellitus with foot ulcer T81.31XA  Disruption of external operation (surgical) wound, not elsewhere classified, initial encounter L97.811 Non-pressure chronic ulcer of other part of right lower leg limited to breakdown of skin L97.821 Non-pressure chronic ulcer of other part of left lower leg limited to breakdown of skin S90.812D Abrasion, left foot, subsequent encounter S50.812D Abrasion of left forearm, subsequent encounter 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 Facility Procedures CPT4 Code: [id number] Description: 11155 - CHEM CAUT GRANULATION TISS ICD-10 Diagnosis Description L97.512 Non-pressure chronic ulcer of other part of right foot with fat layer exposed Modifier: Quantity: 1 Physician Procedures : CPT4 Code Description Modifier [id number] - WC PHYS CHEM CAUT GRAN TISSUE ICD-10 Diagnosis Description L97.512 Non-pressure chronic ulcer of other part of right foot with fat layer exposed Quantity: 1 Electronic Signature(s) Signed: 11/27/2019 4:22:50 PM By: Linton Ham MD Signed: 11/27/2019 4:24:12 PM By: [name] RN, BSN Previous Signature: 11/26/2019 7:39:18 AM Version By: Linton Ham MD Previous Signature: 11/24/2019 5:56:33 PM Version By: Kela Millin Entered By: [name] on 11/27/2019 09:34:14

## 2019-11-28 DIAGNOSIS — M5412 Radiculopathy, cervical region: Secondary | ICD-10-CM | POA: Diagnosis not present

## 2019-11-28 DIAGNOSIS — M542 Cervicalgia: Secondary | ICD-10-CM | POA: Diagnosis not present

## 2019-11-28 DIAGNOSIS — M25571 Pain in right ankle and joints of right foot: Secondary | ICD-10-CM | POA: Diagnosis not present

## 2019-11-28 DIAGNOSIS — Z6841 Body Mass Index (BMI) 40.0 and over, adult: Secondary | ICD-10-CM | POA: Diagnosis not present

## 2019-11-28 DIAGNOSIS — M25552 Pain in left hip: Secondary | ICD-10-CM | POA: Diagnosis not present

## 2019-11-28 DIAGNOSIS — M25511 Pain in right shoulder: Secondary | ICD-10-CM | POA: Diagnosis not present

## 2019-11-28 DIAGNOSIS — M545 Low back pain: Secondary | ICD-10-CM | POA: Diagnosis not present

## 2019-11-28 DIAGNOSIS — G894 Chronic pain syndrome: Secondary | ICD-10-CM | POA: Diagnosis not present

## 2019-11-28 DIAGNOSIS — M25512 Pain in left shoulder: Secondary | ICD-10-CM | POA: Diagnosis not present

## 2019-11-28 DIAGNOSIS — E1143 Type 2 diabetes mellitus with diabetic autonomic (poly)neuropathy: Secondary | ICD-10-CM | POA: Diagnosis not present

## 2019-12-01 ENCOUNTER — Encounter (HOSPITAL_BASED_OUTPATIENT_CLINIC_OR_DEPARTMENT_OTHER): Payer: Medicaid Other | Attending: Internal Medicine | Admitting: Internal Medicine

## 2019-12-01 ENCOUNTER — Encounter (HOSPITAL_BASED_OUTPATIENT_CLINIC_OR_DEPARTMENT_OTHER): Payer: Medicaid Other | Admitting: Internal Medicine

## 2019-12-01 DIAGNOSIS — T8131XA Disruption of external operation (surgical) wound, not elsewhere classified, initial encounter: Secondary | ICD-10-CM | POA: Diagnosis not present

## 2019-12-01 DIAGNOSIS — S50812A Abrasion of left forearm, initial encounter: Secondary | ICD-10-CM | POA: Diagnosis not present

## 2019-12-01 DIAGNOSIS — E11621 Type 2 diabetes mellitus with foot ulcer: Secondary | ICD-10-CM | POA: Diagnosis present

## 2019-12-01 DIAGNOSIS — Y838 Other surgical procedures as the cause of abnormal reaction of the patient, or of later complication, without mention of misadventure at the time of the procedure: Secondary | ICD-10-CM | POA: Diagnosis not present

## 2019-12-01 DIAGNOSIS — L97512 Non-pressure chronic ulcer of other part of right foot with fat layer exposed: Secondary | ICD-10-CM | POA: Insufficient documentation

## 2019-12-01 DIAGNOSIS — Z89422 Acquired absence of other left toe(s): Secondary | ICD-10-CM | POA: Insufficient documentation

## 2019-12-01 DIAGNOSIS — E1142 Type 2 diabetes mellitus with diabetic polyneuropathy: Secondary | ICD-10-CM | POA: Diagnosis not present

## 2019-12-01 DIAGNOSIS — L97821 Non-pressure chronic ulcer of other part of left lower leg limited to breakdown of skin: Secondary | ICD-10-CM | POA: Diagnosis not present

## 2019-12-01 DIAGNOSIS — S90812A Abrasion, left foot, initial encounter: Secondary | ICD-10-CM | POA: Insufficient documentation

## 2019-12-01 DIAGNOSIS — L97811 Non-pressure chronic ulcer of other part of right lower leg limited to breakdown of skin: Secondary | ICD-10-CM | POA: Insufficient documentation

## 2019-12-01 DIAGNOSIS — E1122 Type 2 diabetes mellitus with diabetic chronic kidney disease: Secondary | ICD-10-CM | POA: Diagnosis not present

## 2019-12-01 DIAGNOSIS — E11610 Type 2 diabetes mellitus with diabetic neuropathic arthropathy: Secondary | ICD-10-CM | POA: Diagnosis not present

## 2019-12-01 NOTE — Progress Notes (Signed)
Jeffery [name], Jeffery [name] ([id number]) . Visit Report for 12/01/2019 Arrival Information Details Patient Name: Date of Service: Jeffery [name]. 12/01/2019 11:00 A M Medical Record Number: [id number] Patient Account Number: [id number] Date of Birth/Sex: Treating RN: [birth year] (59 y.o. Jeffery [name] Jeffery [name]: Jeffery [name] Jeffery [name]: Referring Jeffery [name]: Treating Jeffery [name]/Jeffery [name]: Jeffery [name]: 5 Visit Information History Since Last Visit Added or deleted any medications: No Patient Arrived: Ambulatory Any new allergies or adverse reactions: No Arrival Time: 11:17 Had a fall or experienced change in No Accompanied By: alone activities of daily living that may affect Transfer Assistance: None risk of falls: Patient Identification Verified: Yes Signs or symptoms of abuse/neglect since last visito No Secondary Verification Process Completed: Yes Hospitalized since last visit: No Patient Requires Transmission-Based Precautions: No Implantable device outside of the clinic excluding No Patient Has Alerts: No cellular tissue based products placed in the center since last visit: Has Dressing in Place as Prescribed: Yes Has Compression in Place as Prescribed: Yes Pain Present Now: No Electronic Signature(s) Signed: 12/01/2019 3:22:52 PM By: Jeffery Hurst RN, BSN Entered By: Jeffery [name] on 12/01/2019 15:11:59 -------------------------------------------------------------------------------- Compression Therapy Details Patient Name: Date of Service: Jeffery [name]. 12/01/2019 11:00 A M Medical Record Number: [id number] Patient Account Number: [id number] Date of Birth/Sex: Treating RN: [birth year] (59 y.o. Jeffery [name] Tabias Swayze: Jeffery [name] Jeffery [name]: Referring Jeffery [name]: Treating Jeffery [name]/Jeffery [name]: Jeffery [name]: 5 Compression Therapy Performed for Wound  Assessment: Wound #11 Left,Anterior Lower Leg Performed By: [name] Jeffery Hurst, RN Compression Type: Four Layer Electronic Signature(s) Signed: 12/01/2019 3:22:52 PM By: Jeffery Hurst RN, BSN Entered By: Jeffery [name] on 12/01/2019 15:13:48 -------------------------------------------------------------------------------- Compression Therapy Details Patient Name: Date of Service: Jeffery [name]. 12/01/2019 11:00 A M Medical Record Number: [id number] Patient Account Number: [id number] Date of Birth/Sex: Treating RN: 16-Feb-1961 (59 y.o. Jeffery [name] Macaulay [name]: Jeffery [name] Jeffery [name]: Referring Jeffery [name]: Treating Jeffery [name]/Jeffery [name]: Jeffery [name]: 5 Compression Therapy Performed for Wound Assessment: Wound #12 Left,Distal,Anterior Lower Leg Performed By: [name] Jeffery Hurst, RN Compression Type: Four Layer Electronic Signature(s) Signed: 12/01/2019 3:22:52 PM By: Jeffery Hurst RN, BSN Entered By: Jeffery [name] on 12/01/2019 15:13:49 -------------------------------------------------------------------------------- Compression Therapy Details Patient Name: Date of Service: Jeffery [name]. 12/01/2019 11:00 A M Medical Record Number: [id number] Patient Account Number: [id number] Date of Birth/Sex: Treating RN: [birth year] (59 y.o. Jeffery [name] Arielle Eber: Jeffery [name] Jeffery [name]: Referring Maddelyn Rocca: Treating Zachrey Deutscher/Jeffery [name]: Jeffery [name]: 5 Compression Therapy Performed for Wound Assessment: Wound #13 Right,Lateral Lower Leg Performed By: [name] Jeffery Hurst, RN Compression Type: Four Layer Electronic Signature(s) Signed: 12/01/2019 3:22:52 PM By: Jeffery Hurst RN, BSN Entered By: Jeffery [name] on 12/01/2019 15:13:49 -------------------------------------------------------------------------------- Compression Therapy Details Patient  Name: Date of Service: Jeffery [name]. 12/01/2019 11:00 A M Medical Record Number: [id number] Patient Account Number: [id number] Date of Birth/Sex: Treating RN: 03/20/1961 (59 y.o. Jeffery [name] Roxie Kreeger: Jeffery [name] Jeffery [name]: Referring Lataya Varnell: Treating Dona Walby/Jeffery [name]: Jeffery [name]: 5 Compression Therapy Performed for Wound Assessment: Wound #2 Left,Lateral Lower Leg Performed By: [name] Jeffery Hurst, RN Compression Type: Four Layer Electronic Signature(s) Signed: 12/01/2019 3:22:52 PM By: Jeffery Hurst RN, BSN Entered By: Jeffery [name] on 12/01/2019 15:13:49 -------------------------------------------------------------------------------- Compression Therapy Details Patient Name: Date of Service: Jeffery [name], Jeffery [name]. 12/01/2019 11:00 A M  Medical Record Number: [id number] Patient Account Number: [id number] Date of Birth/Sex: Treating RN: 1960-09-14 (59 y.o. Jeffery [name] Jeffery [name]: Jeffery [name]: Jeffery [name] Referring Larnce Schnackenberg: Treating Jeffery [name]/Jeffery [name]: Jeffery [name]: 5 Compression Therapy Performed for Wound Assessment: Wound #7 Right,Lateral Foot Performed By: [name] Jeffery Hurst, RN Compression Type: Four Layer Electronic Signature(s) Signed: 12/01/2019 3:22:52 PM By: Jeffery Hurst RN, BSN Entered By: Jeffery [name] on 12/01/2019 15:13:49 -------------------------------------------------------------------------------- [name] Details Patient Name: Date of Service: Jeffery [name]. 12/01/2019 11:00 A M Medical Record Number: [id number] Patient Account Number: [id number] Date of Birth/Sex: Treating RN: 1961/03/20 (59 y.o. Jeffery [name] Primary [name] Jeffery [name]: Jeffery [name] Jeffery [name]: Referring Jeffery [name]: Treating Jeffery [name]/Jeffery [name]: Jeffery [name]:  5 Active Inactive Abuse / Safety / Falls / Self [name] Management Nursing Diagnoses: Potential for falls Potential for injury related to falls Goals: Patient will remain injury free related to falls Date Initiated: 10/23/2019 Target Resolution Date: 12/15/2019 Goal Status: Active Patient/caregiver will verbalize/demonstrate measures taken to prevent injury and/or falls Date Initiated: 10/23/2019 Target Resolution Date: 12/15/2019 Goal Status: Active Interventions: Assess Activities of Daily Living upon admission and as needed Assess fall risk on admission and as needed Assess: immobility, friction, shearing, incontinence upon admission and as needed Assess impairment of mobility on admission and as needed per policy Assess personal safety and home safety (as indicated) on admission and as needed Assess self [name] needs on admission and as needed Provide education on fall prevention Provide education on personal and home safety Notes: Nutrition Nursing Diagnoses: Impaired glucose control: actual or potential Potential for alteratiion in Nutrition/Potential for imbalanced nutrition Goals: Patient/caregiver agrees to and verbalizes understanding of need to use nutritional supplements and/or vitamins as prescribed Date Initiated: 10/23/2019 Target Resolution Date: 12/15/2019 Goal Status: Active Patient/caregiver will maintain therapeutic glucose control Date Initiated: 10/23/2019 Target Resolution Date: 12/15/2019 Goal Status: Active Interventions: Assess HgA1c results as ordered upon admission and as needed Assess patient nutrition upon admission and as needed per policy Provide education on elevated blood sugars and impact on wound healing Provide education on nutrition [name] Activities: Education provided on Nutrition : 10/23/2019 Notes: Venous Leg Ulcer Nursing Diagnoses: Knowledge deficit related to disease process and management Potential for venous Insuffiency (use before  diagnosis confirmed) Goals: Patient will maintain optimal edema control Date Initiated: 10/23/2019 Target Resolution Date: 12/15/2019 Goal Status: Active Patient/caregiver will verbalize understanding of disease process and disease management Date Initiated: 10/23/2019 Target Resolution Date: 12/15/2019 Goal Status: Active Interventions: Assess peripheral edema status every visit. Compression as ordered Provide education on venous insufficiency Notes: Wound/Skin Impairment Nursing Diagnoses: Impaired tissue integrity Knowledge deficit related to ulceration/compromised skin integrity Goals: Patient/caregiver will verbalize understanding of skin [name] regimen Date Initiated: 10/23/2019 Target Resolution Date: 12/15/2019 Goal Status: Active Interventions: Assess patient/caregiver ability to obtain necessary supplies Assess patient/caregiver ability to perform ulcer/skin [name] regimen upon admission and as needed Assess ulceration(s) every visit Provide education on ulcer and skin [name] Notes: Electronic Signature(s) Signed: 12/01/2019 3:13:35 PM By: Kela Millin Entered By: Kela Millin on 12/01/2019 11:18:06 -------------------------------------------------------------------------------- Patient/Caregiver Education Details Patient Name: Date of Service: Jeffery [name], Jeffery [name]. 9/3/2021andnbsp11:00 A M Medical Record Number: [id number] Patient Account Number: [id number] Date of Birth/Gender: Treating RN: 04-04-1960 (59 y.o. Jeffery [name] Primary [name] Physician: Jeffery [name] Jeffery [name]: Referring Physician: Treating Physician/Jeffery [name]: [name] in [name]: 5 Education Assessment Education Provided To: Patient Education Topics Provided Nutrition: Handouts: Elevated Blood  Sugars: How Do They Affect Wound Healing Methods: Explain/Verbal Responses: State content correctly Venous: Handouts: Controlling Swelling with  Compression Stockings Methods: Explain/Verbal Responses: State content correctly Wound/Skin Impairment: Handouts: Caring for Your Ulcer Methods: Explain/Verbal Responses: State content correctly Electronic Signature(s) Signed: 12/01/2019 3:13:35 PM By: Kela Millin Entered By: Kela Millin on 12/01/2019 11:18:51 -------------------------------------------------------------------------------- Wound Assessment Details Patient Name: Date of Service: Jeffery [name]. 12/01/2019 11:00 A M Medical Record Number: [id number] Patient Account Number: [id number] Date of Birth/Sex: Treating RN: July 12, 1960 (59 y.o. Jeffery [name] Yasmin Dibello: Jeffery [name] Jeffery [name]: Referring Brailey Buescher: Treating Faelyn Sigler/Jeffery [name]: Jeffery [name]: 5 Wound Status Wound Number: 11 Primary Etiology: Venous Leg Ulcer Wound Location: Left, Anterior Lower Leg Wound Status: Open Wounding Event: Blister Comorbid History: Congestive Heart Failure, Hypertension, Type II Diabetes Date Acquired: 11/17/2019 Weeks Of [name]: 1 Clustered Wound: No Wound Measurements Length: (cm) 1.2 Width: (cm) 1.2 Depth: (cm) 0.1 Area: (cm) 1.131 Volume: (cm) 0.113 % Reduction in Area: 0% % Reduction in Volume: 0% Epithelialization: Small (1-33%) Tunneling: No Undermining: No Wound Description Classification: Full Thickness Without Exposed Support Structures Wound Margin: Flat and Intact Exudate Amount: Medium Exudate Type: Serous Exudate Color: amber Foul Odor After Cleansing: No Slough/Fibrino Yes Wound Bed Granulation Amount: Large (67-100%) Exposed Structure Granulation Quality: Pink Fascia Exposed: No Necrotic Amount: None Present (0%) Fat Layer (Subcutaneous Tissue) Exposed: Yes Tendon Exposed: No Muscle Exposed: No Joint Exposed: No Bone Exposed: No [name] Notes Wound #11 (Left, Anterior Lower Leg) 1. Cleanse With Soap and  water 2. Periwound [name] Moisturizing lotion 3. Primary Dressing Applied Calcium Alginate Ag 4. Secondary Dressing ABD Pad Dry Gauze 6. Support Layer Applied 4 layer compression Water quality scientist) Signed: 12/01/2019 3:22:52 PM By: Jeffery Hurst RN, BSN Entered By: Jeffery [name] on 12/01/2019 15:13:03 -------------------------------------------------------------------------------- Wound Assessment Details Patient Name: Date of Service: Jeffery [name]. 12/01/2019 11:00 A M Medical Record Number: [id number] Patient Account Number: [id number] Date of Birth/Sex: Treating RN: November 16, 1960 (59 y.o. Jeffery [name] Garey Alleva: Jeffery [name] Jeffery [name]: Referring Kimiko Common: Treating Phelan Goers/Jeffery [name]: Jeffery [name]: 5 Wound Status Wound Number: 12 Primary Etiology: Venous Leg Ulcer Wound Location: Left, Distal, Anterior Lower Leg Wound Status: Open Wounding Event: Blister Comorbid History: Congestive Heart Failure, Hypertension, Type II Diabetes Date Acquired: 11/24/2019 Weeks Of [name]: 1 Clustered Wound: No Wound Measurements Length: (cm) 0.7 Width: (cm) 1.3 Depth: (cm) 0.1 Area: (cm) 0.715 Volume: (cm) 0.071 % Reduction in Area: 0% % Reduction in Volume: 0% Epithelialization: Small (1-33%) Tunneling: No Undermining: No Wound Description Classification: Full Thickness Without Exposed Support Structures Wound Margin: Flat and Intact Exudate Amount: Medium Exudate Type: Serous Exudate Color: amber Foul Odor After Cleansing: No Slough/Fibrino No Wound Bed Granulation Amount: Large (67-100%) Exposed Structure Granulation Quality: Pink Fascia Exposed: No Necrotic Amount: None Present (0%) Fat Layer (Subcutaneous Tissue) Exposed: Yes Tendon Exposed: No Muscle Exposed: No Joint Exposed: No Bone Exposed: No [name] Notes Wound #12 (Left, Distal, Anterior Lower Leg) 1. Cleanse With Soap and  water 2. Periwound [name] Moisturizing lotion 3. Primary Dressing Applied Calcium Alginate Ag 4. Secondary Dressing ABD Pad Dry Gauze 6. Support Layer Applied 4 layer compression Water quality scientist) Signed: 12/01/2019 3:22:52 PM By: Jeffery Hurst RN, BSN Entered By: Jeffery [name] on 12/01/2019 15:13:09 -------------------------------------------------------------------------------- Wound Assessment Details Patient Name: Date of Service: Jeffery [name]. 12/01/2019 11:00 A M Medical Record Number: [id number] Patient Account Number: [id number]  Date of Birth/Sex: Treating RN: March 08, 1961 (59 y.o. Jeffery [name] Danyale Ridinger: Jeffery [name] Jeffery [name]: Referring Legna Mausolf: Treating Areona Homer/Jeffery [name]: Jeffery [name]: 5 Wound Status Wound Number: 13 Primary Etiology: Venous Leg Ulcer Wound Location: Right, Lateral Lower Leg Wound Status: Open Wounding Event: Gradually Appeared Comorbid History: Congestive Heart Failure, Hypertension, Type II Diabetes Date Acquired: 11/17/2019 Weeks Of [name]: 1 Clustered Wound: Yes Wound Measurements Length: (cm) 3 Width: (cm) 0.3 Depth: (cm) 0.1 Clustered Quantity: 3 Area: (cm) 0. Volume: (cm) 0. % Reduction in Area: 0% % Reduction in Volume: 0% Epithelialization: Medium (34-66%) Tunneling: No 707 Undermining: No 071 Wound Description Classification: Full Thickness Without Exposed Support Structures Wound Margin: Flat and Intact Exudate Amount: Small Exudate Type: Serous Exudate Color: amber Foul Odor After Cleansing: No Slough/Fibrino Yes Wound Bed Granulation Amount: Large (67-100%) Exposed Structure Granulation Quality: Pink Fascia Exposed: No Necrotic Amount: Small (1-33%) Fat Layer (Subcutaneous Tissue) Exposed: Yes Necrotic Quality: Adherent Slough Tendon Exposed: No Muscle Exposed: No Joint Exposed: No Bone Exposed: No [name] Notes Wound #13  (Right, Lateral Lower Leg) 1. Cleanse With Soap and water 2. Periwound [name] Moisturizing lotion 3. Primary Dressing Applied Calcium Alginate Ag 4. Secondary Dressing ABD Pad Dry Gauze 6. Support Layer Applied 4 layer compression Water quality scientist) Signed: 12/01/2019 3:22:52 PM By: Jeffery Hurst RN, BSN Entered By: Jeffery [name] on 12/01/2019 15:13:15 -------------------------------------------------------------------------------- Wound Assessment Details Patient Name: Date of Service: Jeffery [name]. 12/01/2019 11:00 A M Medical Record Number: [id number] Patient Account Number: [id number] Date of Birth/Sex: Treating RN: 1960/09/21 (59 y.o. Jeffery [name] Qais Jowers: Jeffery [name] Jeffery [name]: Referring Silvanna Ohmer: Treating Johathan Province/Jeffery [name]: Jeffery [name]: 5 Wound Status Wound Number: 2 Primary Etiology: Diabetic Wound/Ulcer of the Lower Extremity Wound Location: Left, Lateral Lower Leg Wound Status: Open Wounding Event: Gradually Appeared Comorbid History: Congestive Heart Failure, Hypertension, Type II Diabetes Date Acquired: 08/09/2019 Weeks Of [name]: 5 Clustered Wound: No Wound Measurements Length: (cm) 1 Width: (cm) 3 Depth: (cm) 0.1 Area: (cm) 2.356 Volume: (cm) 0.236 % Reduction in Area: -650.3% % Reduction in Volume: -661.3% Epithelialization: Small (1-33%) Tunneling: No Undermining: No Wound Description Classification: Grade 2 Wound Margin: Flat and Intact Exudate Amount: Medium Exudate Type: Serous Exudate Color: amber Foul Odor After Cleansing: No Slough/Fibrino No Wound Bed Granulation Amount: Large (67-100%) Exposed Structure Granulation Quality: Pink Fascia Exposed: No Necrotic Amount: None Present (0%) Fat Layer (Subcutaneous Tissue) Exposed: Yes Tendon Exposed: No Muscle Exposed: No Joint Exposed: No Bone Exposed: No [name] Notes Wound #2 (Left, Lateral  Lower Leg) 1. Cleanse With Soap and water 2. Periwound [name] Moisturizing lotion 3. Primary Dressing Applied Calcium Alginate Ag 4. Secondary Dressing ABD Pad Dry Gauze 6. Support Layer Applied 4 layer compression Water quality scientist) Signed: 12/01/2019 3:22:52 PM By: Jeffery Hurst RN, BSN Entered By: Jeffery [name] on 12/01/2019 15:13:25 -------------------------------------------------------------------------------- Wound Assessment Details Patient Name: Date of Service: Jeffery [name]. 12/01/2019 11:00 A M Medical Record Number: [id number] Patient Account Number: [id number] Date of Birth/Sex: Treating RN: [birth year] (59 y.o. Jeffery [name] Queenie Aufiero: Jeffery [name] Jeffery [name]: Referring Mirabelle Cyphers: Treating Earnestine Shipp/Jeffery [name]: Jeffery [name]: 5 Wound Status Wound Number: 7 Primary Etiology: Diabetic Wound/Ulcer of the Lower Extremity Wound Location: Right, Lateral Foot Wound Status: Open Wounding Event: Gradually Appeared Comorbid History: Congestive Heart Failure, Hypertension, Type II Diabetes Date Acquired: 08/02/2019 Weeks Of [name]: 5 Clustered Wound: No Wound  Measurements Length: (cm) 3.3 Width: (cm) 2.3 Depth: (cm) 0.2 Area: (cm) 5.961 Volume: (cm) 1.192 % Reduction in Area: 43.8% % Reduction in Volume: 83.9% Epithelialization: Small (1-33%) Tunneling: No Undermining: No Wound Description Classification: Grade 2 Wound Margin: Flat and Intact Exudate Amount: Medium Exudate Type: Serosanguineous Exudate Color: red, Schorr Foul Odor After Cleansing: No Slough/Fibrino Yes Wound Bed Granulation Amount: Large (67-100%) Exposed Structure Granulation Quality: Red Fascia Exposed: No Necrotic Amount: Small (1-33%) Fat Layer (Subcutaneous Tissue) Exposed: Yes Necrotic Quality: Adherent Slough Tendon Exposed: No Muscle Exposed: No Joint Exposed: No Bone Exposed: No [name]  Notes Wound #7 (Right, Lateral Foot) 1. Cleanse With Soap and water 2. Periwound [name] Moisturizing lotion 3. Primary Dressing Applied Calcium Alginate Ag 4. Secondary Dressing ABD Pad Dry Gauze 6. Support Layer Applied 4 layer compression Water quality scientist) Signed: 12/01/2019 3:22:52 PM By: Jeffery Hurst RN, BSN Signed: 12/01/2019 3:22:52 PM By: Jeffery Hurst RN, BSN Entered By: Jeffery [name] on 12/01/2019 15:13:33 -------------------------------------------------------------------------------- [place] Details Patient Name: Date of Service: Jeffery [name], Headrick [name]. 12/01/2019 11:00 A M Medical Record Number: [id number] Patient Account Number: [id number] Date of Birth/Sex: Treating RN: 19-Aug-1960 (59 y.o. Jeffery [name] Reva Pinkley: Jeffery [name] Jeffery [name]: Referring Leeum Sankey: Treating Myosha Cuadras/Jeffery [name]: Jeffery [name]: 5 Vital Signs Time Taken: 11:18 Temperature (F): 97.8 Height (in): 74 Pulse (bpm): 83 Weight (lbs): 345 Respiratory Rate (breaths/min): 18 Body Mass Index (BMI): 44.3 Blood Pressure (mmHg): 124/75 Reference Range: 80 - 120 mg / dl Electronic Signature(s) Signed: 12/01/2019 3:22:52 PM By: Jeffery Hurst RN, BSN Entered By: Jeffery [name] on 12/01/2019 15:12:27

## 2019-12-03 ENCOUNTER — Other Ambulatory Visit: Payer: Self-pay | Admitting: Family Medicine

## 2019-12-03 DIAGNOSIS — E1165 Type 2 diabetes mellitus with hyperglycemia: Secondary | ICD-10-CM

## 2019-12-03 DIAGNOSIS — IMO0002 Reserved for concepts with insufficient information to code with codable children: Secondary | ICD-10-CM

## 2019-12-03 NOTE — Telephone Encounter (Signed)
Requested Prescriptions  Pending Prescriptions Disp Refills  . ACCU-CHEK GUIDE test strip [Pharmacy Med Name: ACCU-CHEK GUIDE TEST STRIP] 100 strip 11    Sig: USE AS DIRECTED AT [facility] A DAY     Endocrinology: Diabetes - Testing Supplies Passed - 12/03/2019 12:52 PM      Passed - Valid encounter within last 12 months    Recent Outpatient Visits          1 month ago Type 2 diabetes mellitus with other circulatory complication, with long-term current use of insulin ([place])   [place], Sublimity, MD   5 months ago Type 2 diabetes mellitus with other circulatory complication, with long-term current use of insulin ([place])   [place]lyn, Bethalto, MD   8 months ago Type 2 diabetes mellitus with other circulatory complication, with long-term current use of insulin ([place])   [place], [place], MD   11 months ago Type 2 diabetes mellitus with hypoglycemia without coma, with long-term current use of insulin ([place])   Porter, Enobong, MD   1 year ago Medication management   [place], RPH-CPP      Future Appointments            In 2 months Troy Sine, MD [facility], [facility]

## 2019-12-05 ENCOUNTER — Other Ambulatory Visit: Payer: Self-pay | Admitting: Family Medicine

## 2019-12-05 NOTE — Telephone Encounter (Signed)
Requested medication (s) are due for refill today -yes  Requested medication (s) are on the active medication list -yes  Future visit scheduled -yes  Last refill: 2 months  Notes to clinic: Request continuation refill from ED  Requested Prescriptions  Pending Prescriptions Disp Refills   amLODipine (NORVASC) 10 MG tablet 30 tablet 1    Sig: Take 1 tablet (10 mg total) by mouth daily.      Cardiovascular:  Calcium Channel Blockers Failed - 12/05/2019 11:50 AM      Failed - Last BP in normal range    BP Readings from Last 1 Encounters:  11/24/19 (!) 147/69          Passed - Valid encounter within last 6 months    Recent Outpatient Visits           1 month ago Type 2 diabetes mellitus with other circulatory complication, with long-term current use of insulin ([place])   [place], [place], MD   5 months ago Type 2 diabetes mellitus with other circulatory complication, with long-term current use of insulin ([place])   [place], [place], MD   8 months ago Type 2 diabetes mellitus with other circulatory complication, with long-term current use of insulin ([place])   [place], [place], MD   11 months ago Type 2 diabetes mellitus with hypoglycemia without coma, with long-term current use of insulin ([place])   [place], [place], MD   1 year ago Medication management   [place], Jarome [place]atin, RPH-CPP       Future Appointments             In 1 month [name], MD [place]   In 2 months Troy Sine, MD [facility], [facility]                Requested Prescriptions  Pending Prescriptions Disp Refills   amLODipine (NORVASC) 10 MG tablet 30 tablet 1    Sig: Take 1 tablet (10 mg total) by mouth daily.      Cardiovascular:  Calcium Channel  Blockers Failed - 12/05/2019 11:50 AM      Failed - Last BP in normal range    BP Readings from Last 1 Encounters:  11/24/19 (!) 147/69          Passed - Valid encounter within last 6 months    Recent Outpatient Visits           1 month ago Type 2 diabetes mellitus with other circulatory complication, with long-term current use of insulin ([facility])   [place], [place], MD   5 months ago Type 2 diabetes mellitus with other circulatory complication, with long-term current use of insulin ([place])   [place], [place], MD   8 months ago Type 2 diabetes mellitus with other circulatory complication, with long-term current use of insulin ([place])   [place], [place], MD   11 months ago Type 2 diabetes mellitus with hypoglycemia without coma, with long-term current use of insulin ([place])   [place], [place], MD   1 year ago Medication management   [place], RPH-CPP       Future Appointments  In 1 month [name], MD [place]   In 2 months Claiborne Billings Joyice Faster, MD [facility] [place], [place]

## 2019-12-05 NOTE — Progress Notes (Signed)
JO, CERONE T ([id number]) . Visit Report for 12/01/2019 Physician Orders Details Patient Name: Date of Service: Jeffery [name] T. 12/01/2019 11:00 A M Medical Record Number: [id number] Patient Account Number: [id number] Date of Birth/Sex: Treating RN: 05/03/1960 (59 y.o. [name] Primary Care Provider: [name] Other Clinician: Referring Provider: Treating Provider/Extender: [name] Weeks in Treatment: 5 Verbal / Phone Orders: No Diagnosis Coding ICD-10 Coding Code Description E11.621 Type 2 diabetes mellitus with foot ulcer T81.31XA Disruption of external operation (surgical) wound, not elsewhere classified, initial encounter L97.811 Non-pressure chronic ulcer of other part of right lower leg limited to breakdown of skin L97.821 Non-pressure chronic ulcer of other part of left lower leg limited to breakdown of skin S90.812D Abrasion, left foot, subsequent encounter S50.812D Abrasion of left forearm, subsequent encounter 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 Dressing Change Frequency Change dressing three times week. - all wounds (wound clinic to change on Friday, home health to change on Monday and Wednesday) Skin Barriers/Peri-Wound Care Moisturizing lotion - both legs Wound Cleansing Clean wound with Normal Saline. - or wound cleanser Primary Wound Dressing Wound #11 Left,Anterior Lower Leg Calcium Alginate with Silver Wound #12 Left,Distal,Anterior Lower Leg Calcium Alginate with Silver Wound #13 Right,Lateral Lower Leg Calcium Alginate with Silver Wound #2 Left,Lateral Lower Leg Calcium Alginate with Silver Wound #7 Right,Lateral Foot Hydrofera Blue - Classic Secondary Dressing Dry Gauze - all wounds ABD pad - all wounds on legs Edema Control 4  layer compression - Bilateral - unna boot to top to anchor Avoid standing for long periods of time Elevate legs to the level of the heart or above for 30 minutes daily and/or when sitting, a frequency of: - throughout the day Exercise regularly [facility] for wound care. - [facility] Electronic Signature(s) Signed: 12/01/2019 3:13:35 PM By: Kela Millin Signed: 12/05/2019 7:41:19 AM By: Linton Ham MD Entered By: Kela Millin on 12/01/2019 11:17:57 -------------------------------------------------------------------------------- Problem List Details Patient Name: Date of Service: Jeffery Raring, [name] 12/01/2019 11:00 A M Medical Record Number: [id number] Patient Account Number: [id number] Date of Birth/Sex: Treating RN: 1960-05-04 (59 y.o. 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 10/23/2019 No Yes of skin L97.821 Non-pressure chronic ulcer of other part of left lower leg limited to breakdown 10/23/2019 No Yes of skin S90.812D Abrasion, left foot, subsequent encounter 10/23/2019 No Yes S50.812D Abrasion of left forearm, subsequent encounter 10/23/2019 No Yes E11.22 Type 2 diabetes mellitus with diabetic chronic kidney disease 10/23/2019 No Yes L97.512 Non-pressure chronic ulcer of other part of right foot with fat layer exposed 11/03/2019 No Yes Inactive Problems Resolved Problems Electronic Signature(s) Signed: 12/01/2019 3:13:35 PM By: Kela Millin Signed: 12/05/2019 7:41:19 AM By: Linton Ham MD Entered By: Kela Millin on 12/01/2019 11:17:34 -------------------------------------------------------------------------------- SuperBill Details Patient Name: Date of Service: Jeffery Porter T. 12/01/2019 Medical Record Number: [id number] Patient Account Number: [id number] Date of Birth/Sex: Treating RN: Apr 11, 1960 (59 y.o. M)  [name] Primary Care Provider: [name] Other Clinician: Referring Provider: Treating Provider/Extender: [name] Weeks in Treatment: 5 Diagnosis Coding ICD-10 Codes Code Description E11.621 Type 2 diabetes mellitus with foot ulcer T81.31XA Disruption of external operation (surgical) wound, not elsewhere classified, initial encounter L97.811 Non-pressure chronic ulcer of other part of right lower leg limited to breakdown of skin L97.821 Non-pressure chronic ulcer of other part of left lower leg limited to breakdown of skin S90.812D Abrasion, left foot, subsequent encounter S50.812D Abrasion of left forearm, subsequent encounter 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 Modifier: Quantity: 1 Electronic Signature(s) Signed: 12/01/2019 3:22:52 PM By: [name] RN, BSN Signed: 12/05/2019 7:41:19 AM By: Linton Ham MD Entered By: [name] on 12/01/2019 15:15:05

## 2019-12-05 NOTE — Telephone Encounter (Signed)
Medication Refill - Medication: amLODipine (NORVASC) 10 MG tablet    Preferred Pharmacy (with phone number or street name):  [facility] [address] [place] [address] Phone:  [phone]  Fax:  [phone]       Agent: Please be advised that RX refills may take up to 3 business days. We ask that you follow-up with your pharmacy.

## 2019-12-07 ENCOUNTER — Other Ambulatory Visit (HOSPITAL_COMMUNITY): Payer: Self-pay | Admitting: *Deleted

## 2019-12-07 MED ORDER — AMLODIPINE BESYLATE 10 MG PO TABS: 10.0000 mg | ORAL_TABLET | Freq: Every day | ORAL | 1 refills | Status: DC

## 2019-12-08 ENCOUNTER — Encounter (HOSPITAL_COMMUNITY)
Admission: RE | Admit: 2019-12-08 | Discharge: 2019-12-08 | Disposition: A | Discharge status: Home / Self Care | Payer: Medicaid Other | Source: Ambulatory Visit | Attending: Nephrology | Admitting: Nephrology

## 2019-12-08 ENCOUNTER — Encounter (HOSPITAL_BASED_OUTPATIENT_CLINIC_OR_DEPARTMENT_OTHER): Payer: Medicaid Other | Admitting: Internal Medicine

## 2019-12-08 DIAGNOSIS — E11621 Type 2 diabetes mellitus with foot ulcer: Secondary | ICD-10-CM | POA: Diagnosis not present

## 2019-12-08 DIAGNOSIS — L97222 Non-pressure chronic ulcer of left calf with fat layer exposed: Secondary | ICD-10-CM | POA: Diagnosis not present

## 2019-12-08 DIAGNOSIS — D631 Anemia in chronic kidney disease: Secondary | ICD-10-CM | POA: Diagnosis not present

## 2019-12-08 DIAGNOSIS — L97512 Non-pressure chronic ulcer of other part of right foot with fat layer exposed: Secondary | ICD-10-CM | POA: Diagnosis not present

## 2019-12-08 DIAGNOSIS — L97522 Non-pressure chronic ulcer of other part of left foot with fat layer exposed: Secondary | ICD-10-CM | POA: Diagnosis not present

## 2019-12-08 DIAGNOSIS — N183 Chronic kidney disease, stage 3 unspecified: Secondary | ICD-10-CM | POA: Insufficient documentation

## 2019-12-08 LAB — RENAL FUNCTION PANEL
Albumin: 3.7 g/dL (ref 3.5–5.0)
Anion gap: 11 (ref 5–15)
BUN: 36 mg/dL — ABNORMAL HIGH (ref 6–20)
CO2: 22 mmol/L (ref 22–32)
Calcium: 9.2 mg/dL (ref 8.9–10.3)
Chloride: 106 mmol/L (ref 98–111)
Creatinine, Ser: 2.64 mg/dL — ABNORMAL HIGH (ref 0.61–1.24)
GFR calc Af Amer: 30 mL/min — ABNORMAL LOW (ref >60)
GFR calc non Af Amer: 26 mL/min — ABNORMAL LOW (ref >60)
Glucose, Bld: 170 mg/dL — ABNORMAL HIGH (ref 70–99)
Phosphorus: 5.6 mg/dL — ABNORMAL HIGH (ref 2.5–4.6)
Potassium: 4.4 mmol/L (ref 3.5–5.1)
Sodium: 139 mmol/L (ref 135–145)

## 2019-12-08 LAB — IRON AND TIBC
Iron: 45 ug/dL (ref 45–182)
Saturation Ratios: 18 % (ref 17.9–39.5)
TIBC: 255 ug/dL (ref 250–450)
UIBC: 210 ug/dL

## 2019-12-08 LAB — POCT HEMOGLOBIN-HEMACUE: Hemoglobin: 10 g/dL — ABNORMAL LOW (ref 13.0–17.0)

## 2019-12-08 LAB — FERRITIN: Ferritin: 514 ng/mL — ABNORMAL HIGH (ref 24–336)

## 2019-12-08 MED ORDER — EPOETIN ALFA-EPBX 10000 UNIT/ML IJ SOLN
INTRAMUSCULAR | Status: AC
  Filled 2019-12-08: qty 2

## 2019-12-08 MED ORDER — EPOETIN ALFA-EPBX 10000 UNIT/ML IJ SOLN
20000.0000 [IU] | Freq: Once | INTRAMUSCULAR | Status: DC
  Administered 2019-12-08: 20000 [IU] via SUBCUTANEOUS

## 2019-12-09 LAB — PTH, INTACT AND CALCIUM
Calcium, Total (PTH): 8.8 mg/dL (ref 8.7–10.2)
PTH: 71 pg/mL — ABNORMAL HIGH (ref 15–65)

## 2019-12-11 NOTE — Progress Notes (Signed)
Jeffery [name], Jeffery [name] ([id number]) . Visit Report for 12/08/2019 HPI Details Patient Name: Date of Service: Jeffery [name]. 12/08/2019 12:45 PM Medical Record Number: [id number] Patient Account Number: [id number] Date of Birth/Sex: Treating RN: 09/21/1960 (59 y.o. Jeffery [name] Primary Care Provider: [name] Other Clinician: Referring Provider: Treating Provider/Extender: [name] Weeks in Treatment: 6 History of Present Illness HPI Description: ADMISSION 10/23/2019 This is a complex 59 year old man who is here with wounds on his bilateral lower legs feet and abrasion on his left forearm. These are generally of different etiology. He is a type II diabetic with peripheral neuropathy but does not have known PAD. Looking through [facility] health link I can see he was being followed by Dr. Earleen Newport earlier this year for a diabetic ulcer on the right foot. An MRI done in May showed no osteomyelitis. Shortly thereafter he developed cellulitis of the right leg and foot and was hospitalized from 5/10 through 5/19 with MSSA sepsis. During this hospitalization he was felt to have osteomyelitis he had debridement twice of the right foot and on one occasion apparently a placement of ACell. He again was hospitalized from 6/10 through 7/1 again with left lower extremity cellulitis increasing edema and acute renal failure. An MRI showed left leg cellulitis but no other major findings. 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 He was discharged with multiple wounds on his legs which apparently started as blisters secondary to fluid overload. His creatinine on 7/5 was 7.49 potassium 4 BUN 116 CO2 of 23 albumin of 2.9. He was seen by Dr. Moshe Cipro of nephrology medications were adjusted she is following him. 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 The patient arrives in clinic today  with multiple wounds of different etiologies on his bilateral lower extremities. On the right leg he has superficial areas on the right medial and lateral.. On his right lateral foot I think the original surgical wound. There is a small area at the base of his left second toe. On the left lower extremity a fairly large wound on the left second toe which was trauma from the shower door. He has several areas on the legs. Which are superficial. Finally he has an abrasion injury on his left arm/skin tear Past medical history includes Charcot foot. Left third toe amputation, type 2 diabetes with peripheral neuropathy, paroxysmal A. fib on Eliquis, diastolic heart failure, acute on chronic renal failure as described. ABI in our clinic was 1.18 on the right 1.19 on the left 11/03/2019 upon evaluation today patient appears to be doing well with regard to his ulcers in general. He is making good progress. The worst is his larger right lateral foot ulcer. This wound is going require some sharp debridement today. Other than that he seems to be doing quite well. 11/10/19-Patient has denuded areas on both legs especially the right where there is more stasis changes and friable skin, areas on his right leg appear to be bigger than before, right lateral foot wound appears about the same. We are using Hydrofera Blue to the foot wound and silver alginate on the rest with 4 layer compression 8/19; patient's wounds on his bilateral anterior lower legs look a lot better. These appear to be progressing towards healing and some of them actually have healed. The most substantial area is on the right lateral foot. I thought this was his original surgical wound site. This  has raised hyper granulation tissue and a completely nonviable surface. We have been using silver alginate to all the wounds on his legs under compression and Hydrofera Blue on the right lateral 8/27; most of the wounds are improving here except for the one  on the right lateral foot. Still hyper granulation were using Hydrofera Blue in this area.he does not have an arterial issue. The wound on the right lateral foot I think was an original surgical wound. I wonder whether he inverts at the ankle and not to complicate healing this. In review E does not have an arterial issue.MRI of the foot did not show osteomyelitis in May He still has several open wounds on the bilateral lower legs although these are a lot better. We are using silver alginate under for R compression 9/10; right lateral foot wound which is a surgical wound is still open. He still has bilateral small wounds question venous lymphedema. He developed a new one in the posterior left calf. We have his right leg in compression he has been wearing a compression garment on the left leg. We are going to wrap both legs today. He does not have an arterial issue. MRI did not suggest osteomyelitis of the right foot Electronic Signature(s) Signed: 12/11/2019 7:26:40 PM By: Linton Ham MD Entered By: Linton Ham on 12/08/2019 14:36:46 -------------------------------------------------------------------------------- Physical Exam Details Patient Name: Date of Service: Jeffery [name]. 12/08/2019 12:45 PM Medical Record Number: [id number] Patient Account Number: [id number] Date of Birth/Sex: Treating RN: 11-17-1960 (59 y.o. Jeffery [name] Primary Care Provider: [name] Other Clinician: Referring Provider: Treating Provider/Extender: [name] Weeks in Treatment: 6 Constitutional Patient is hypertensive.. Pulse regular and within target range for patient.Marland Kitchen Respirations regular, non-labored and within target range.. Temperature is normal and within the target range for the patient.Marland Kitchen Appears in no distress. Notes Wound examthe area on the right lateral foot generally clean surface. Some improvement in surface area were using Hydrofera Blue Several  small scattered areas on both legs. He has good edema control on the right but not on the left. Nonpitting edema on the left. This looks like lymphedema Electronic Signature(s) Signed: 12/11/2019 7:26:40 PM By: Linton Ham MD Entered By: Linton Ham on 12/08/2019 14:41:32 -------------------------------------------------------------------------------- Physician Orders Details Patient Name: Date of Service: Jeffery [name]. 12/08/2019 12:45 PM Medical Record Number: [id number] Patient Account Number: [id number] Date of Birth/Sex: Treating RN: 1960/10/30 (59 y.o. Jeffery [name] Primary Care Provider: [name] Other Clinician: Referring Provider: Treating Provider/Extender: [name] Weeks in Treatment: 6 Verbal / Phone Orders: No Diagnosis Coding ICD-10 Coding Code Description E11.621 Type 2 diabetes mellitus with foot ulcer T81.31XA Disruption of external operation (surgical) wound, not elsewhere classified, initial encounter L97.811 Non-pressure chronic ulcer of other part of right lower leg limited to breakdown of skin L97.821 Non-pressure chronic ulcer of other part of left lower leg limited to breakdown of skin S90.812D Abrasion, left foot, subsequent encounter S50.812D Abrasion of left forearm, subsequent encounter 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 Dressing Change Frequency Change dressing three times week. - all wounds (wound clinic to change on Friday, home health to change on Monday and Wednesday) Skin Barriers/Peri-Wound Care Moisturizing lotion - both legs Wound Cleansing Clean wound with Normal Saline. - or wound cleanser Primary Wound Dressing Wound #13R Right,Lateral Lower Leg  Calcium Alginate with Silver Wound #14 Left [name] Second oe Calcium  Alginate with Silver Wound #15 Left,Posterior Lower Leg Calcium Alginate with Silver Wound #7 Right,Lateral Foot Hydrofera Blue - Classic Secondary Dressing Dry Gauze - all wounds ABD pad - all wounds on legs Edema Control 4 layer compression - Bilateral - unna boot to top to anchor Avoid standing for long periods of time Elevate legs to the level of the heart or above for 30 minutes daily and/or when sitting, a frequency of: - throughout the day Exercise regularly [place] skilled [facility] for wound care. - [facility] Electronic Signature(s) Signed: 12/08/2019 4:40:23 PM By: Kela Millin Signed: 12/11/2019 7:26:40 PM By: Linton Ham MD Entered By: Kela Millin on 12/08/2019 14:16:54 -------------------------------------------------------------------------------- Problem List Details Patient Name: Date of Service: Jeffery [name]. 12/08/2019 12:45 PM Medical Record Number: [id number] Patient Account Number: [id number] Date of Birth/Sex: Treating RN: Nov 20, 1960 (59 y.o. Jeffery [name] Primary Care Provider: [name] Other Clinician: Referring Provider: Treating Provider/Extender: [name] Weeks in Treatment: 6 Active Problems ICD-10 Encounter Code Description Active Date MDM Diagnosis E11.621 Type 2 diabetes mellitus with foot ulcer 10/23/2019 No Yes T81.31XA Disruption of external operation (surgical) wound, not elsewhere classified, 10/23/2019 No Yes initial encounter L97.811 Non-pressure chronic ulcer of other part of right lower leg limited to breakdown 10/23/2019 No Yes of skin L97.821 Non-pressure chronic ulcer of other part of left lower leg limited to breakdown 10/23/2019 No Yes of skin S90.812D Abrasion, left foot, subsequent encounter 10/23/2019 No Yes S50.812D Abrasion of left forearm, subsequent encounter 10/23/2019 No Yes E11.22 Type 2 diabetes mellitus with diabetic chronic kidney disease  10/23/2019 No Yes L97.512 Non-pressure chronic ulcer of other part of right foot with fat layer exposed 11/03/2019 No Yes Inactive Problems Resolved Problems Electronic Signature(s) Signed: 12/11/2019 7:26:40 PM By: Linton Ham MD Entered By: Linton Ham on 12/08/2019 14:28:12 -------------------------------------------------------------------------------- Progress Note Details Patient Name: Date of Service: Jeffery [name]. 12/08/2019 12:45 PM Medical Record Number: [id number] Patient Account Number: [id number] Date of Birth/Sex: Treating RN: Jun 07, 1960 (59 y.o. Jeffery [name] Primary Care Provider: [name] Other Clinician: Referring Provider: Treating Provider/Extender: [name] Weeks in Treatment: 6 Subjective History of Present Illness (HPI) ADMISSION 10/23/2019 This is a complex 59 year old man who is here with wounds on his bilateral lower legs feet and abrasion on his left forearm. These are generally of different etiology. He is a type II diabetic with peripheral neuropathy but does not have known PAD. Looking through [facility] health link I can see he was being followed by Dr. Earleen Newport earlier this year for a diabetic ulcer on the right foot. An MRI done in May showed no osteomyelitis. Shortly thereafter he developed cellulitis of the right leg and foot and was hospitalized from 5/10 through 5/19 with MSSA sepsis. During this hospitalization he was felt to have osteomyelitis he had debridement twice of the right foot and on one occasion apparently a placement of ACell. He again was hospitalized from 6/10 through 7/1 again with left lower extremity cellulitis increasing edema and acute renal failure. An MRI showed left leg cellulitis but no other major findings. 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 He was discharged with multiple wounds on his legs which apparently started as blisters secondary to fluid overload. His  creatinine on 7/5 was 7.49 potassium 4 BUN 116 CO2 of 23 albumin of 2.9. He was seen by  Dr. Moshe Cipro of nephrology medications were adjusted she is following him. 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 The patient arrives in clinic today with multiple wounds of different etiologies on his bilateral lower extremities. ooOn the right leg he has superficial areas on the right medial and lateral.. On his right lateral foot I think the original surgical wound. There is a small area at the base of his left second toe. ooOn the left lower extremity a fairly large wound on the left second toe which was trauma from the shower door. He has several areas on the legs. Which are superficial. ooFinally he has an abrasion injury on his left arm/skin tear Past medical history includes Charcot foot. Left third toe amputation, type 2 diabetes with peripheral neuropathy, paroxysmal A. fib on Eliquis, diastolic heart failure, acute on chronic renal failure as described. ABI in our clinic was 1.18 on the right 1.19 on the left 11/03/2019 upon evaluation today patient appears to be doing well with regard to his ulcers in general. He is making good progress. The worst is his larger right lateral foot ulcer. This wound is going require some sharp debridement today. Other than that he seems to be doing quite well. 11/10/19-Patient has denuded areas on both legs especially the right where there is more stasis changes and friable skin, areas on his right leg appear to be bigger than before, right lateral foot wound appears about the same. We are using Hydrofera Blue to the foot wound and silver alginate on the rest with 4 layer compression 8/19; patient's wounds on his bilateral anterior lower legs look a lot better. These appear to be progressing towards healing and some of them actually have healed. The most substantial area is on the right  lateral foot. I thought this was his original surgical wound site. This has raised hyper granulation tissue and a completely nonviable surface. We have been using silver alginate to all the wounds on his legs under compression and Hydrofera Blue on the right lateral 8/27; most of the wounds are improving here except for the one on the right lateral foot. Still hyper granulation were using Hydrofera Blue in this area.he does not have an arterial issue. The wound on the right lateral foot I think was an original surgical wound. I wonder whether he inverts at the ankle and not to complicate healing this. In review E does not have an arterial issue.MRI of the foot did not show osteomyelitis in May He still has several open wounds on the bilateral lower legs although these are a lot better. We are using silver alginate under for R compression 9/10; right lateral foot wound which is a surgical wound is still open. He still has bilateral small wounds question venous lymphedema. He developed a new one in the posterior left calf. We have his right leg in compression he has been wearing a compression garment on the left leg. We are going to wrap both legs today. He does not have an arterial issue. MRI did not suggest osteomyelitis of the right foot Objective Constitutional Patient is hypertensive.. Pulse regular and within target range for patient.Marland Kitchen Respirations regular, non-labored and within target range.. Temperature is normal and within the target range for the patient.Marland Kitchen Appears in no distress. Vitals Time Taken: 1:06 PM, Height: 74 in, Weight: 345 lbs, BMI: 44.3, Temperature: 97.9 F, Pulse: 80 bpm, Respiratory Rate: 22 breaths/min, Blood Pressure: 148/97 mmHg, Capillary Blood Glucose:  71 mg/dl. General Notes: Wound examoothe area on the right lateral foot generally clean surface. Some improvement in surface area were using Hydrofera Blue ooSeveral small scattered areas on both legs. He has good edema  control on the right but not on the left. Nonpitting edema on the left. This looks like lymphedema Integumentary (Hair, Skin) Wound #11 status is Healed - Epithelialized. Original cause of wound was Blister. The wound is located on the Left,Anterior Lower Leg. The wound measures 0cm length x 0cm width x 0cm depth; 0cm^2 area and 0cm^3 volume. Wound #12 status is Healed - Epithelialized. Original cause of wound was Blister. The wound is located on the [facility] Lower Leg. The wound measures 0cm length x 0cm width x 0cm depth; 0cm^2 area and 0cm^3 volume. Wound #13R status is Open. Original cause of wound was Gradually Appeared. The wound is located on the Right,Lateral Lower Leg. The wound measures 0.1cm length x 0.1cm width x 0.1cm depth; 0.008cm^2 area and 0.001cm^3 volume. There is Fat Layer (Subcutaneous Tissue) exposed. There is no tunneling or undermining noted. There is a small amount of serous drainage noted. The wound margin is flat and intact. There is large (67-100%) pink granulation within the wound bed. There is no necrotic tissue within the wound bed. Wound #14 status is Open. Original cause of wound was Trauma. The wound is located on the Left [name] Second. The wound measures 0.7cm length x 0.7cm oe width x 0.1cm depth; 0.385cm^2 area and 0.038cm^3 volume. There is Fat Layer (Subcutaneous Tissue) exposed. There is no tunneling or undermining noted. There is a medium amount of serosanguineous drainage noted. The wound margin is distinct with the outline attached to the wound base. There is medium (34- 66%) red, pink granulation within the wound bed. There is a medium (34-66%) amount of necrotic tissue within the wound bed. Wound #15 status is Open. Original cause of wound was Gradually Appeared. The wound is located on the Left,Posterior Lower Leg. The wound measures 0.8cm length x 0.8cm width x 0.1cm depth; 0.503cm^2 area and 0.05cm^3 volume. There is Fat Layer (Subcutaneous  Tissue) exposed. There is no tunneling or undermining noted. There is a medium amount of serosanguineous drainage noted. The wound margin is distinct with the outline attached to the wound base. There is no granulation within the wound bed. There is a large (67-100%) amount of necrotic tissue within the wound bed including Adherent Slough. Wound #2 status is Healed - Epithelialized. Original cause of wound was Gradually Appeared. The wound is located on the Left,Lateral Lower Leg. The wound measures 0cm length x 0cm width x 0cm depth; 0cm^2 area and 0cm^3 volume. Wound #7 status is Open. Original cause of wound was Gradually Appeared. The wound is located on the Right,Lateral Foot. The wound measures 2.9cm length x 1.7cm width x 0.2cm depth; 3.872cm^2 area and 0.774cm^3 volume. There is Fat Layer (Subcutaneous Tissue) exposed. There is no tunneling or undermining noted. There is a medium amount of serosanguineous drainage noted. The wound margin is flat and intact. There is large (67-100%) red granulation within the wound bed. There is a small (1-33%) amount of necrotic tissue within the wound bed including Adherent Slough. 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 There was a Four Layer oe Compression Therapy Procedure by Deon Pilling, RN. Post procedure Diagnosis Wound #14: Same as Pre-Procedure Wound #15 Pre-procedure diagnosis of Wound #15 is a Diabetic Wound/Ulcer of the Lower Extremity located on the Left,Posterior Lower Leg . There was a Four Layer Compression Therapy Procedure by Deon Pilling, RN. Post procedure Diagnosis Wound #15: Same as Pre-Procedure Wound #7 Pre-procedure diagnosis of Wound #7 is a Diabetic Wound/Ulcer of the Lower Extremity located on the Right,Lateral Foot . There was a Four Layer Compression Therapy Procedure by Deon Pilling, RN. Post procedure Diagnosis Wound #7: Same as Pre-Procedure Plan Follow-up Appointments: Return Appointment in 1 week. Dressing Change Frequency: Change dressing three times week. - all wounds (wound clinic to change on Friday, home health to change on Monday and Wednesday) Skin Barriers/Peri-Wound Care: Moisturizing lotion - both legs Wound Cleansing: Clean wound with Normal Saline. - or wound cleanser Primary Wound Dressing: Wound #13R Right,Lateral Lower Leg: Calcium Alginate with Silver Wound #14 Left [name] Second: oe Calcium Alginate with Silver Wound #15 Left,Posterior Lower Leg: Calcium Alginate with Silver Wound #7 Right,Lateral Foot: Hydrofera Blue - Classic Secondary Dressing: Dry Gauze - all wounds ABD pad - all wounds on legs Edema Control: 4 layer compression - Bilateral - unna boot to top to anchor Avoid standing for long periods of time Elevate legs to the level of the heart or above for 30 minutes daily and/or when sitting, a frequency of: - throughout the day Exercise regularly Home Health: [place] skilled [facility] for wound care. - [facility] 1. I decided to wrap both his legs. I do not have a good sense of the edema issue here. This should close the wounds on the bilateral lower legs. He does not have an evidence of a DVT 2. Still silver alginate under the wrap to all wounds on his legs. 3. Hydrofera Blue to the right  foot Electronic Signature(s) Signed: 12/11/2019 7:26:40 PM By: Linton Ham MD Entered By: Linton Ham on 12/08/2019 14:42:25 -------------------------------------------------------------------------------- SuperBill Details Patient Name: Date of Service: Jeffery [name], Jeffery [name]. 12/08/2019 Medical Record Number: [id number] Patient Account Number: [id number] Date of Birth/Sex: Treating RN: Nov 04, 1960 (59 y.o. Jeffery [name] Primary Care Provider: [name] Other Clinician: Referring Provider: Treating Provider/Extender: [name] Weeks in Treatment: 6 Diagnosis Coding ICD-10 Codes Code Description E11.621 Type 2 diabetes mellitus with foot ulcer T81.31XA Disruption of external operation (surgical) wound, not elsewhere classified, initial encounter L97.811 Non-pressure chronic ulcer of other part of right lower leg limited to breakdown of skin L97.821 Non-pressure chronic ulcer of other part of left lower leg limited to breakdown of skin S90.812D Abrasion, left foot, subsequent encounter S50.812D Abrasion of left forearm, subsequent encounter 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 Facility Procedures CPT4: Code [id number] foot Description: 1 BILATERAL: Application of multi-layer venous compression system; leg (below knee), including ankle and . Modifier: Quantity: 1 Physician Procedures : CPT4 Code Description Modifier [id number] - WC PHYS LEVEL 3 - EST PT ICD-10 Diagnosis Description L97.811 Non-pressure chronic ulcer of other  part of right lower leg limited to breakdown of skin L97.821 Non-pressure chronic ulcer of other part of  left lower leg limited to breakdown of skin L97.512 Non-pressure chronic ulcer of other part of right foot with fat layer exposed E11.22 Type 2 diabetes mellitus with diabetic chronic kidney  disease Quantity: 1 Electronic Signature(s) Signed: 12/11/2019 7:26:40 PM By: Linton Ham MD Entered By: Linton Ham on 12/08/2019 14:43:02

## 2019-12-11 NOTE — Progress Notes (Signed)
Jeffery [name], Jeffery [name] ([id number]) . Visit Report for 12/08/2019 Arrival Information Details Patient Name: Date of Service: Jeffery [name]. 12/08/2019 12:45 PM Medical Record Number: [id number] Patient Account Number: [id number] Date of Birth/Sex: Treating RN: 04/17/1960 (59 y.o. Jeffery [name], Meta.Reding Primary Care Ronita Hargreaves: [name] Other Clinician: Referring Sanai Frick: Treating Frankee Gritz/Extender: [name] Weeks in Treatment: 6 Visit Information History Since Last Visit Added or deleted any medications: No Patient Arrived: Ambulatory Any new allergies or adverse reactions: No Arrival Time: 13:06 Had a fall or experienced change in No Accompanied By: self activities of daily living that may affect Transfer Assistance: None risk of falls: Patient Identification Verified: Yes Signs or symptoms of abuse/neglect since last visito No Secondary Verification Process Completed: Yes Hospitalized since last visit: No Patient Requires Transmission-Based Precautions: No Implantable device outside of the clinic excluding No Patient Has Alerts: No cellular tissue based products placed in the center since last visit: Has Dressing in Place as Prescribed: Yes Has Compression in Place as Prescribed: Yes Pain Present Now: Yes Electronic Signature(s) Signed: 12/08/2019 4:55:25 PM By: Deon Pilling Entered By: Deon Pilling on 12/08/2019 13:09:10 -------------------------------------------------------------------------------- Compression Therapy Details Patient Name: Date of Service: Jeffery [name]. 12/08/2019 12:45 PM Medical Record Number: [id number] Patient Account Number: [id number] Date of Birth/Sex: Treating RN: 1960-07-29 (59 y.o. Jeffery [name] Primary Care Janyth Riera: [name] Other Clinician: Referring Mitul Hallowell: Treating Chaka Boyson/Extender: [name] Weeks in Treatment: 6 Compression Therapy Performed for Wound  Assessment: Wound #14 Left [name] Second oe Performed By: Clinician Deon Pilling, RN Compression Type: Four Layer Post Procedure Diagnosis Same as Pre-procedure Electronic Signature(s) Signed: 12/08/2019 4:40:23 PM By: Kela Millin Entered By: Kela Millin on 12/08/2019 14:10:57 -------------------------------------------------------------------------------- Compression Therapy Details Patient Name: Date of Service: Jeffery [name]. 12/08/2019 12:45 PM Medical Record Number: [id number] Patient Account Number: [id number] Date of Birth/Sex: Treating RN: Sep 30, 1960 (59 y.o. Jeffery [name] Primary Care Kierrah Kilbride: [name] Other Clinician: Referring Dekota Kirlin: Treating Lindsay Straka/Extender: [name] Weeks in Treatment: 6 Compression Therapy Performed for Wound Assessment: Wound #15 Left,Posterior Lower Leg Performed By: Clinician Deon Pilling, RN Compression Type: Four Layer Post Procedure Diagnosis Same as Pre-procedure Electronic Signature(s) Signed: 12/08/2019 4:40:23 PM By: Kela Millin Entered By: Kela Millin on 12/08/2019 14:10:58 -------------------------------------------------------------------------------- Compression Therapy Details Patient Name: Date of Service: Jeffery [name]. 12/08/2019 12:45 PM Medical Record Number: [id number] Patient Account Number: [id number] Date of Birth/Sex: Treating RN: 06-17-1960 (59 y.o. Jeffery [name] Primary Care Noris Kulinski: [name] Other Clinician: Referring Chisom Muntean: Treating Derico Mitton/Extender: [name] Weeks in Treatment: 6 Compression Therapy Performed for Wound Assessment: Wound #7 Right,Lateral Foot Performed By: Clinician Deon Pilling, RN Compression Type: Four Layer Post Procedure Diagnosis Same as Pre-procedure Electronic Signature(s) Signed: 12/08/2019 4:40:23 PM By: Kela Millin Entered By: Kela Millin on  12/08/2019 14:10:58 -------------------------------------------------------------------------------- Encounter Discharge Information Details Patient Name: Date of Service: Jeffery [name]. 12/08/2019 12:45 PM Medical Record Number: [id number] Patient Account Number: [id number] Date of Birth/Sex: Treating RN: 11/30/1960 (59 y.o. Jeffery [name] Primary Care Airon Sahni: [name] Other Clinician: Referring Jermiya Reichl: Treating Nolberto Cheuvront/Extender: [name] in Treatment: 6 Encounter Discharge Information Items Discharge Condition: Stable Ambulatory Status: Ambulatory Discharge Destination: Home Transportation: Private Auto Accompanied By: self Schedule Follow-up Appointment: Yes Clinical Summary of Care: Electronic Signature(s) Signed: 12/08/2019 4:55:25 PM By: Deon Pilling Entered By: Deon Pilling on 12/08/2019 14:58:33 -------------------------------------------------------------------------------- Lower Extremity Assessment Details Patient Name: Date  of Service: Jeffery [name] 12/08/2019 12:45 PM Medical Record Number: [id number] Patient Account Number: [id number] Date of Birth/Sex: Treating RN: [birth year] (59 y.o. Jeffery [name] Primary Care Lamont Tant: [name] Other Clinician: Referring Kristan Votta: Treating Lattie Riege/Extender: [name] Weeks in Treatment: 6 Edema Assessment Assessed: [name]: Yes] [Right: Yes] Edema: [Left: Yes] [Right: Yes] Calf Left: Right: Point of Measurement: 48 cm From Medial Instep 49 cm 36.5 cm Ankle Left: Right: Point of Measurement: 14 cm From Medial Instep 29 cm 23 cm Vascular Assessment Pulses: Dorsalis Pedis Palpable: [Left:Yes] [Right:Yes] Electronic Signature(s) Signed: 12/08/2019 4:55:25 PM By: Deon Pilling Entered By: Deon Pilling on 12/08/2019 13:12:23 -------------------------------------------------------------------------------- Multi Wound Chart  Details Patient Name: Date of Service: Jeffery [name]. 12/08/2019 12:45 PM Medical Record Number: [id number] Patient Account Number: [id number] Date of Birth/Sex: Treating RN: Jan 01, 1961 (59 y.o. Jeffery [name] Primary Care Nikoletta Varma: [name] Other Clinician: Referring Chelsey Redondo: Treating Jereld Presti/Extender: [name] Weeks in Treatment: 6 Vital Signs Height(in): 74 Capillary Blood Glucose(mg/dl): 71 Weight(lbs): 345 Pulse(bpm): 80 Body Mass Index(BMI): 47 Blood Pressure(mmHg): 148/97 Temperature(F): 97.9 Respiratory Rate(breaths/min): 22 Photos: [11:No Photos Left, Anterior Lower Leg] [12:No Photos Left, Distal, Anterior Lower Leg] [13R:No Photos Right, Lateral Lower Leg] Wound Location: [11:Blister] [12:Blister] [13R:Gradually Appeared] Wounding Event: [11:Venous Leg Ulcer] [12:Venous Leg Ulcer] [13R:Venous Leg Ulcer] Primary Etiology: [11:N/A] [12:N/A] [13R:Congestive Heart Failure,] Comorbid History: [11:11/17/2019] [12:11/24/2019] [13R:Hypertension, Type II Diabetes 11/17/2019] Date Acquired: [11:2] [12:2] [13R:2] Weeks of Treatment: [11:Healed - Epithelialized] [12:Healed - Epithelialized] [13R:Open] Wound Status: [11:No] [12:No] [13R:Yes] Wound Recurrence: [11:No] [12:No] [13R:Yes] Clustered Wound: [11:N/A] [12:N/A] [13R:3] Clustered Quantity: [11:0x0x0] [12:0x0x0] [13R:0.1x0.1x0.1] Measurements L x W x D (cm) [11:0] [12:0] [13R:0.008] A (cm) : rea [11:0] [12:0] [13R:0.001] Volume (cm) : [11:100.00%] [12:100.00%] [13R:98.90%] % Reduction in Area: [11:100.00%] [12:100.00%] [13R:98.60%] % Reduction in Volume: [11:Full Thickness Without Exposed] [12:Full Thickness Without Exposed] [13R:Full Thickness Without Exposed] Classification: [11:Support Structures N/A] [12:Support Structures N/A] [13R:Support Structures Small] Exudate A mount: [11:N/A] [12:N/A] [13R:Serous] Exudate Type: [11:N/A] [12:N/A] [13R:amber] Exudate Color: [11:N/A]  [12:N/A] [13R:Flat and Intact] Wound Margin: [11:N/A] [12:N/A] [13R:Large (67-100%)] Granulation A mount: [11:N/A] [12:N/A] [13R:Pink] Granulation Quality: [11:N/A] [12:N/A] [13R:None Present (0%)] Necrotic A mount: [11:N/A] [12:N/A] [13R:Medium (34-66%)] Epithelialization: [11:N/A] [12:N/A] [13R:N/A] Wound Number: [id number]  Photos: No Photos No Photos No Photos Left [name] Second oe Left, Posterior Lower Leg Left, Lateral Lower Leg Wound Location: Trauma Gradually Appeared Gradually Appeared Wounding Event: Diabetic Wound/Ulcer of the Lower Diabetic Wound/Ulcer of the Lower Diabetic Wound/Ulcer of the Lower Primary Etiology: Extremity Extremity Extremity Congestive Heart Failure, Congestive Heart Failure, N/A Comorbid History: Hypertension, Type II Diabetes Hypertension, Type II Diabetes 12/01/2019 12/08/2019 08/09/2019 Date Acquired: 0 0 6 Weeks of Treatment: Open Open Healed - Epithelialized Wound Status: No No No Wound Recurrence: No No No Clustered Wound: N/A N/A N/A Clustered Quantity: 0.7x0.7x0.1 0.8x0.8x0.1 0x0x0 Measurements L x W x D (cm) 0.385 0.503 0 A (cm) : rea 0.038 0.05 0 Volume (cm) : N/A N/A 100.00% % Reduction in A rea: N/A N/A 100.00% % Reduction in Volume: Grade 2 Grade 2 Grade 2 Classification: Medium Medium N/A Exudate A mount: Serosanguineous Serosanguineous N/A Exudate Type: red, [place] red, [place]ircher N/A Exudate Color: Distinct, outline attached Distinct, outline attached N/A Wound Margin: Medium (34-66%) None Present (0%) N/A Granulation A mount: Red, Pink N/A N/A Granulation Quality: Medium (34-66%) Large (67-100%) N/A Necrotic A mount: Fat Layer (Subcutaneous Tissue): Yes Fat Layer (Subcutaneous Tissue): Yes N/A Exposed Structures: Fascia:  No Fascia: No Tendon: No Tendon: No Muscle: No Muscle: No Joint: No Joint: No Bone: No Bone: No Small (1-33%) Small (1-33%) N/A Epithelialization: Compression Therapy Compression Therapy  N/A Procedures Performed: Wound Number: 7 N/A N/A Photos: No Photos N/A N/A Right, Lateral Foot N/A N/A Wound Location: Gradually Appeared N/A N/A Wounding Event: Diabetic Wound/Ulcer of the Lower N/A N/A Primary Etiology: Extremity Congestive Heart Failure, N/A N/A Comorbid History: Hypertension, Type II Diabetes 08/02/2019 N/A N/A Date Acquired: 6 N/A N/A Weeks of Treatment: Open N/A N/A Wound Status: No N/A N/A Wound Recurrence: No N/A N/A Clustered Wound: N/A N/A N/A Clustered Quantity: 2.9x1.7x0.2 N/A N/A Measurements L x W x D (cm) 3.872 N/A N/A A (cm) : rea 0.774 N/A N/A Volume (cm) : 63.50% N/A N/A % Reduction in A rea: 89.60% N/A N/A % Reduction in Volume: Grade 2 N/A N/A Classification: Medium N/A N/A Exudate A mount: Serosanguineous N/A N/A Exudate Type: red, [place] N/A N/A Exudate Color: Flat and Intact N/A N/A Wound Margin: Large (67-100%) N/A N/A Granulation A mount: Red N/A N/A Granulation Quality: Small (1-33%) N/A N/A Necrotic A mount: Fat Layer (Subcutaneous Tissue): Yes N/A N/A Exposed Structures: Fascia: No Tendon: No Muscle: No Joint: No Bone: No Medium (34-66%) N/A N/A Epithelialization: Compression Therapy N/A N/A Procedures Performed: Treatment Notes Electronic Signature(s) Signed: 12/08/2019 4:40:23 PM By: Kela Millin Signed: 12/11/2019 7:26:40 PM By: Linton Ham MD Entered By: Linton Ham on 12/08/2019 14:28:25 -------------------------------------------------------------------------------- Multi-Disciplinary Care Plan Details Patient Name: Date of Service: Jeffery [name], Jeffery Schaumann PHER [name]. 12/08/2019 12:45 PM Medical Record Number: [id number] Patient Account Number: [id number] Date of Birth/Sex: Treating RN: 07/24/1960 (59 y.o. Jeffery [name] Primary Care Lakashia Collison: [name] Other Clinician: Referring Marius Betts: Treating Ellyce Lafevers/Extender: [name] Weeks in Treatment:  6 Active Inactive Abuse / Safety / Falls / Self Care Management Nursing Diagnoses: Potential for falls Potential for injury related to falls Goals: Patient will remain injury free related to falls Date Initiated: 10/23/2019 Target Resolution Date: 12/15/2019 Goal Status: Active Patient/caregiver will verbalize/demonstrate measures taken to prevent injury and/or falls Date Initiated: 10/23/2019 Target Resolution Date: 12/15/2019 Goal Status: Active Interventions: Assess Activities of Daily Living upon admission and as needed Assess fall risk on admission and as needed Assess: immobility, friction, shearing, incontinence upon admission and as needed Assess impairment of mobility on admission and as needed per policy Assess personal safety and home safety (as indicated) on admission and as needed Assess self care needs on admission and as needed Provide education on fall prevention Provide education on personal and home safety Notes: Nutrition Nursing Diagnoses: Impaired glucose control: actual or potential Potential for alteratiion in Nutrition/Potential for imbalanced nutrition Goals: Patient/caregiver agrees to and verbalizes understanding of need to use nutritional supplements and/or vitamins as prescribed Date Initiated: 10/23/2019 Target Resolution Date: 12/15/2019 Goal Status: Active Patient/caregiver will maintain therapeutic glucose control Date Initiated: 10/23/2019 Target Resolution Date: 12/15/2019 Goal Status: Active Interventions: Assess HgA1c results as ordered upon admission and as needed Assess patient nutrition upon admission and as needed per policy Provide education on elevated blood sugars and impact on wound healing Provide education on nutrition Treatment Activities: Education provided on Nutrition : 12/01/2019 Notes: Venous Leg Ulcer Nursing Diagnoses: Knowledge deficit related to disease process and management Potential for venous Insuffiency (use before  diagnosis confirmed) Goals: Patient will maintain optimal edema control Date Initiated: 10/23/2019 Target Resolution Date: 12/15/2019 Goal Status: Active Patient/caregiver will verbalize understanding of disease process and disease management Date Initiated: 10/23/2019  Target Resolution Date: 12/15/2019 Goal Status: Active Interventions: Assess peripheral edema status every visit. Compression as ordered Provide education on venous insufficiency Notes: Wound/Skin Impairment Nursing Diagnoses: Impaired tissue integrity Knowledge deficit related to ulceration/compromised skin integrity Goals: Patient/caregiver will verbalize understanding of skin care regimen Date Initiated: 10/23/2019 Target Resolution Date: 12/15/2019 Goal Status: Active Interventions: Assess patient/caregiver ability to obtain necessary supplies Assess patient/caregiver ability to perform ulcer/skin care regimen upon admission and as needed Assess ulceration(s) every visit Provide education on ulcer and skin care Notes: Electronic Signature(s) Signed: 12/08/2019 4:40:23 PM By: Kela Millin Entered By: Kela Millin on 12/08/2019 13:07:25 -------------------------------------------------------------------------------- Pain Assessment Details Patient Name: Date of Service: Jeffery [name]. 12/08/2019 12:45 PM Medical Record Number: [id number] Patient Account Number: [id number] Date of Birth/Sex: Treating RN: 1960-12-31 (59 y.o. Jeffery [name] Primary Care Florance Paolillo: [name] Other Clinician: Referring Imani Fiebelkorn: Treating Jack Mineau/Extender: [name] Weeks in Treatment: 6 Active Problems Location of Pain Severity and Description of Pain Patient Has Paino Yes Site Locations Pain Location: Generalized Pain Rate the pain. Current Pain Level: 8 Worst Pain Level: 10 Least Pain Level: 0 Tolerable Pain Level: 8 Pain Management and Medication Current Pain  Management: Medication: No Cold Application: No Rest: No Massage: No Activity: No ChandlerE.N.S.: No Heat Application: No Leg drop or elevation: No Is the Current Pain Management Adequate: Adequate How does your wound impact your activities of daily livingo Sleep: No Bathing: No Appetite: No Relationship With Others: No Bladder Continence: No Emotions: No Bowel Continence: No Work: No Toileting: No Drive: No Dressing: No Hobbies: No Electronic Signature(s) Signed: 12/08/2019 4:55:25 PM By: Deon Pilling Entered By: Deon Pilling on 12/08/2019 13:10:29 -------------------------------------------------------------------------------- Patient/Caregiver Education Details Patient Name: Date of Service: Jeffery [name]. 9/10/2021andnbsp12:45 PM Medical Record Number: [id number] Patient Account Number: [id number] Date of Birth/Gender: Treating RN: 1960-06-09 (59 y.o. Jeffery [name] Primary Care Physician: [name] Other Clinician: Referring Physician: Treating Physician/Extender: [name] in Treatment: 6 Education Assessment Education Provided To: Patient Education Topics Provided Venous: Handouts: Controlling Swelling with Multilayered Compression Wraps Methods: Explain/Verbal Responses: State content correctly Wound/Skin Impairment: Handouts: Caring for Your Ulcer Methods: Explain/Verbal Responses: State content correctly Electronic Signature(s) Signed: 12/08/2019 4:40:23 PM By: Kela Millin Entered By: Kela Millin on 12/08/2019 13:10:42 -------------------------------------------------------------------------------- Wound Assessment Details Patient Name: Date of Service: Jeffery [name]. 12/08/2019 12:45 PM Medical Record Number: [id number] Patient Account Number: [id number] Date of Birth/Sex: Treating RN: Sep 24, 1960 (59 y.o. Jeffery [name] Primary Care Manvir Prabhu: [name] Other  Clinician: Referring Daniah Zaldivar: Treating Kymber Kosar/Extender: [name] Weeks in Treatment: 6 Wound Status Wound Number: 11 Primary Etiology: Venous Leg Ulcer Wound Location: Left, Anterior Lower Leg Wound Status: Healed - Epithelialized Wounding Event: Blister Date Acquired: 11/17/2019 Weeks Of Treatment: 2 Clustered Wound: No Wound Measurements Length: (cm) Width: (cm) Depth: (cm) Area: (cm) Volume: (cm) 0 % Reduction in Area: 100% 0 % Reduction in Volume: 100% 0 0 0 Wound Description Classification: Full Thickness Without Exposed Support Structur es Electronic Signature(s) Signed: 12/08/2019 4:55:25 PM By: Deon Pilling Entered By: Deon Pilling on 12/08/2019 13:15:39 -------------------------------------------------------------------------------- Wound Assessment Details Patient Name: Date of Service: Jeffery [name]. 12/08/2019 12:45 PM Medical Record Number: [id number] Patient Account Number: [id number] Date of Birth/Sex: Treating RN: Dec 06, 1960 (59 y.o. Jeffery [name] Primary Care Joniya Boberg: [name] Other Clinician: Referring Ralph Benavidez: Treating Jeymi Hepp/Extender: [name] Weeks in Treatment: 6 Wound Status Wound Number: 12 Primary Etiology:  Venous Leg Ulcer Wound Location: Left, Distal, Anterior Lower Leg Wound Status: Healed - Epithelialized Wounding Event: Blister Date Acquired: 11/24/2019 Weeks Of Treatment: 2 Clustered Wound: No Wound Measurements Length: (cm) Width: (cm) Depth: (cm) Area: (cm) Volume: (cm) 0 % Reduction in Area: 100% 0 % Reduction in Volume: 100% 0 0 0 Wound Description Classification: Full Thickness Without Exposed Support Structur es Electronic Signature(s) Signed: 12/08/2019 4:55:25 PM By: Deon Pilling Entered By: Deon Pilling on 12/08/2019 13:15:39 -------------------------------------------------------------------------------- Wound Assessment  Details Patient Name: Date of Service: Jeffery [name]. 12/08/2019 12:45 PM Medical Record Number: [id number] Patient Account Number: [id number] Date of Birth/Sex: Treating RN: May 11, 1960 (59 y.o. Jeffery [name] Primary Care Shyne Lehrke: [name] Other Clinician: Referring Lavar Rosenzweig: Treating Phillippe Orlick/Extender: [name] Weeks in Treatment: 6 Wound Status Wound Number: [id number] Primary Etiology: Venous Leg Ulcer Wound Location: Right, Lateral Lower Leg Wound Status: Open Wounding Event: Gradually Appeared Comorbid History: Congestive Heart Failure, Hypertension, Type II Diabetes Date Acquired: 11/17/2019 Weeks Of Treatment: 2 Clustered Wound: Yes Wound Measurements Length: (cm) 0. Width: (cm) 0. Depth: (cm) 0. Clustered Quantity: 3 Area: (cm) 0 Volume: (cm) 0 1 % Reduction in Area: 98.9% 1 % Reduction in Volume: 98.6% 1 Epithelialization: Medium (34-66%) Tunneling: No .008 Undermining: No .001 Wound Description Classification: Full Thickness Without Exposed Support Structures Wound Margin: Flat and Intact Exudate Amount: Small Exudate Type: Serous Exudate Color: amber Foul Odor After Cleansing: No Slough/Fibrino No Wound Bed Granulation Amount: Large (67-100%) Exposed Structure Granulation Quality: Pink Fascia Exposed: No Necrotic Amount: None Present (0%) Fat Layer (Subcutaneous Tissue) Exposed: Yes Tendon Exposed: No Muscle Exposed: No Joint Exposed: No Bone Exposed: No Treatment Notes Wound #13R (Right, Lateral Lower Leg) 1. Cleanse With Wound Cleanser Soap and water 2. Periwound Care Moisturizing lotion 3. Primary Dressing Applied Calcium Alginate Ag 4. Secondary Dressing Dry Gauze 6. Support Layer Applied 4 layer compression wrap Notes patient declined Haematologist. Electronic Signature(s) Signed: 12/08/2019 4:40:23 PM By: Kela Millin Signed: 12/08/2019 4:55:25 PM By: Deon Pilling Entered By: Kela Millin  on 12/08/2019 14:14:18 -------------------------------------------------------------------------------- Wound Assessment Details Patient Name: Date of Service: Jeffery [name]. 12/08/2019 12:45 PM Medical Record Number: [id number] Patient Account Number: [id number] Date of Birth/Sex: Treating RN: Aug 29, 1960 (59 y.o. Jeffery [name] Primary Care Ashantae Pangallo: [name] Other Clinician: Referring Noland Pizano: Treating Jamarr Treinen/Extender: [name] Weeks in Treatment: 6 Wound Status Wound Number: 14 Primary Etiology: Diabetic Wound/Ulcer of the Lower Extremity Wound Location: Left [name] Second oe Wound Status: Open Wounding Event: Trauma Comorbid History: Congestive Heart Failure, Hypertension, Type II Diabetes Date Acquired: 12/01/2019 Weeks Of Treatment: 0 Clustered Wound: No Wound Measurements Length: (cm) 0.7 Width: (cm) 0.7 Depth: (cm) 0.1 Area: (cm) 0.385 Volume: (cm) 0.038 % Reduction in Area: % Reduction in Volume: Epithelialization: Small (1-33%) Tunneling: No Undermining: No Wound Description Classification: Grade 2 Wound Margin: Distinct, outline attached Exudate Amount: Medium Exudate Type: Serosanguineous Exudate Color: red, [place] Foul Odor After Cleansing: No Slough/Fibrino Yes Wound Bed Granulation Amount: Medium (34-66%) Exposed Structure Granulation Quality: Red, Pink Fascia Exposed: No Necrotic Amount: Medium (34-66%) Fat Layer (Subcutaneous Tissue) Exposed: Yes Tendon Exposed: No Muscle Exposed: No Joint Exposed: No Bone Exposed: No Treatment Notes Wound #14 (Left Toe Second) 1. Cleanse With Wound Cleanser Soap and water 3. Primary Dressing Applied Calcium Alginate Ag 4. Secondary Dressing Dry Gauze Roll Gauze 5. Secured With Medipore tape Notes netting. Electronic Signature(s) Signed: 12/08/2019 4:55:25 PM By: Deon Pilling Entered By:  Deon Pilling on 12/08/2019  13:22:15 -------------------------------------------------------------------------------- Wound Assessment Details Patient Name: Date of Service: Jeffery [name]. 12/08/2019 12:45 PM Medical Record Number: [id number] Patient Account Number: [id number] Date of Birth/Sex: Treating RN: 19-Sep-1960 (59 y.o. Jeffery [name] Primary Care Mckenzie Bove: [name] Other Clinician: Referring Clairissa Valvano: Treating Senya Hinzman/Extender: [name] Weeks in Treatment: 6 Wound Status Wound Number: 15 Primary Etiology: Diabetic Wound/Ulcer of the Lower Extremity Wound Location: Left, Posterior Lower Leg Wound Status: Open Wounding Event: Gradually Appeared Comorbid History: Congestive Heart Failure, Hypertension, Type II Diabetes Date Acquired: 12/08/2019 Weeks Of Treatment: 0 Clustered Wound: No Wound Measurements Length: (cm) 0.8 Width: (cm) 0.8 Depth: (cm) 0.1 Area: (cm) 0.503 Volume: (cm) 0.05 % Reduction in Area: % Reduction in Volume: Epithelialization: Small (1-33%) Tunneling: No Undermining: No Wound Description Classification: Grade 2 Wound Margin: Distinct, outline attached Exudate Amount: Medium Exudate Type: Serosanguineous Exudate Color: red, [place] Foul Odor After Cleansing: No Slough/Fibrino Yes Wound Bed Granulation Amount: None Present (0%) Exposed Structure Necrotic Amount: Large (67-100%) Fascia Exposed: No Necrotic Quality: Adherent Slough Fat Layer (Subcutaneous Tissue) Exposed: Yes Tendon Exposed: No Muscle Exposed: No Joint Exposed: No Bone Exposed: No Electronic Signature(s) Signed: 12/08/2019 4:55:25 PM By: Deon Pilling Entered By: Deon Pilling on 12/08/2019 13:24:13 -------------------------------------------------------------------------------- Wound Assessment Details Patient Name: Date of Service: Jeffery [name]. 12/08/2019 12:45 PM Medical Record Number: [id number] Patient Account Number: [id number] Date of  Birth/Sex: Treating RN: 03/11/1961 (59 y.o. Jeffery [name] Primary Care Loa Idler: [name] Other Clinician: Referring Knox Holdman: Treating Logyn Kendrick/Extender: [name] Weeks in Treatment: 6 Wound Status Wound Number: 2 Primary Etiology: Diabetic Wound/Ulcer of the Lower Extremity Wound Location: Left, Lateral Lower Leg Wound Status: Healed - Epithelialized Wounding Event: Gradually Appeared Date Acquired: 08/09/2019 Weeks Of Treatment: 6 Clustered Wound: No Wound Measurements Length: (cm) Width: (cm) Depth: (cm) Area: (cm) Volume: (cm) 0 % Reduction in Area: 100% 0 % Reduction in Volume: 100% 0 0 0 Wound Description Classification: Grade 2 Electronic Signature(s) Signed: 12/08/2019 4:55:25 PM By: Deon Pilling Entered By: Deon Pilling on 12/08/2019 13:15:40 -------------------------------------------------------------------------------- Wound Assessment Details Patient Name: Date of Service: Jeffery [name]. 12/08/2019 12:45 PM Medical Record Number: [id number] Patient Account Number: [id number] Date of Birth/Sex: Treating RN: Aug 08, 1960 (59 y.o. Jeffery [name] Primary Care Jerell Demery: [name] Other Clinician: Referring Makenly Larabee: Treating Simrit Gohlke/Extender: [name] Weeks in Treatment: 6 Wound Status Wound Number: 7 Primary Etiology: Diabetic Wound/Ulcer of the Lower Extremity Wound Location: Right, Lateral Foot Wound Status: Open Wounding Event: Gradually Appeared Comorbid History: Congestive Heart Failure, Hypertension, Type II Diabetes Date Acquired: 08/02/2019 Weeks Of Treatment: 6 Clustered Wound: No Wound Measurements Length: (cm) 2.9 Width: (cm) 1.7 Depth: (cm) 0.2 Area: (cm) 3.872 Volume: (cm) 0.774 Wound Description Classification: Grade 2 Wound Margin: Flat and Intact Exudate Amount: Medium Exudate Type: Serosanguineous Exudate Color: red, Sandra Foul Odor After  Cleansing: Slough/Fibrino % Reduction in Area: 63.5% % Reduction in Volume: 89.6% Epithelialization: Medium (34-66%) Tunneling: No Undermining: No No Yes Wound Bed Granulation Amount: Large (67-100%) Exposed Structure Granulation Quality: Red Fascia Exposed: No Necrotic Amount: Small (1-33%) Fat Layer (Subcutaneous Tissue) Exposed: Yes Necrotic Quality: Adherent Slough Tendon Exposed: No Muscle Exposed: No Joint Exposed: No Bone Exposed: No Treatment Notes Wound #7 (Right, Lateral Foot) 1. Cleanse With Wound Cleanser Soap and water 2. Periwound Care Moisturizing lotion 3. Primary Dressing Applied Hydrofera Blue 4. Secondary Dressing Dry Gauze 6. Support Layer Applied 4 layer compression  wrap Notes hydrofera blue classic moisten with saline. Electronic Signature(s) Signed: 12/08/2019 4:55:25 PM By: Deon Pilling Entered By: Deon Pilling on 12/08/2019 13:16:01 -------------------------------------------------------------------------------- Vitals Details Patient Name: Date of Service: Jeffery [name], [place] [name]. 12/08/2019 12:45 PM Medical Record Number: [id number] Patient Account Number: [id number] Date of Birth/Sex: Treating RN: 03-22-1961 (59 y.o. Jeffery [name] Primary Care Yoseline Andersson: [name] Other Clinician: Referring Lataunya Ruud: Treating Carnie Bruemmer/Extender: [name] Weeks in Treatment: 6 Vital Signs Time Taken: 13:06 Temperature (F): 97.9 Height (in): 74 Pulse (bpm): 80 Weight (lbs): 345 Respiratory Rate (breaths/min): 22 Body Mass Index (BMI): 44.3 Blood Pressure (mmHg): 148/97 Capillary Blood Glucose (mg/dl): 71 Reference Range: 80 - 120 mg / dl Electronic Signature(s) Signed: 12/08/2019 4:55:25 PM By: Deon Pilling Entered By: Deon Pilling on 12/08/2019 13:10:13

## 2019-12-13 ENCOUNTER — Other Ambulatory Visit: Payer: Self-pay | Admitting: Family Medicine

## 2019-12-13 NOTE — Telephone Encounter (Signed)
Notes to clinic:  Patient only has 1 pill left Medication were filled by a different provider Review for refill   Requested Prescriptions  Pending Prescriptions Disp Refills   hydrALAZINE (APRESOLINE) 50 MG tablet 90 tablet 1    Sig: Take 1 tablet (50 mg total) by mouth every 8 (eight) hours.      Cardiovascular:  Vasodilators Failed - 12/13/2019  1:55 PM      Failed - HCT in normal range and within 360 days    HCT  Date Value Ref Range Status  10/12/2019 25.7 (L) 39 - 52 % Final   Hematocrit  Date Value Ref Range Status  08/25/2019 32.2 (L) 37.5 - 51.0 % Final          Failed - HGB in normal range and within 360 days    Hemoglobin  Date Value Ref Range Status  12/08/2019 10.0 (L) 13.0 - 17.0 g/dL Final  08/25/2019 10.4 (L) 13.0 - 17.7 g/dL Final          Failed - RBC in normal range and within 360 days    RBC  Date Value Ref Range Status  10/12/2019 3.07 (L) 4.22 - 5.81 MIL/uL Final          Failed - WBC in normal range and within 360 days    WBC  Date Value Ref Range Status  10/12/2019 10.7 (H) 4.0 - 10.5 K/uL Final          Failed - PLT in normal range and within 360 days    Platelets  Date Value Ref Range Status  10/12/2019 443 (H) 150 - 400 K/uL Final  08/25/2019 377 150 - 450 x10E3/uL Final          Failed - Last BP in normal range    BP Readings from Last 1 Encounters:  12/08/19 (!) 157/74          Passed - Valid encounter within last 12 months    Recent Outpatient Visits           2 months ago Type 2 diabetes mellitus with other circulatory complication, with long-term current use of insulin (HCC)   [place], [place], MD   5 months ago Type 2 diabetes mellitus with other circulatory complication, with long-term current use of insulin ([place])   [place], [place], MD   8 months ago Type 2 diabetes mellitus with other circulatory complication, with long-term current  use of insulin ([place])   [place], [place], MD   11 months ago Type 2 diabetes mellitus with hypoglycemia without coma, with long-term current use of insulin ([place])   [place], [place], MD   1 year ago Medication management   [place], Jarome Matin, RPH-CPP       Future Appointments             In 1 month [name], MD [place]   In 2 months Troy Sine, MD [facility]              furosemide (LASIX) 80 MG tablet 60 tablet 1    Sig: Take 1 tablet (80 mg total) by mouth 2 (two) times daily.      Cardiovascular:  Diuretics - Loop Failed - 12/13/2019  1:55 PM      Failed -  Cr in normal range and within 360 days    Creat  Date Value Ref Range Status  05/14/2016 0.80 0.70 - 1.33 mg/dL Final    Comment:      For patients > or = 58 years of age: The upper reference limit for Creatinine is approximately 13% higher for people identified as African-American.      Creatinine, Ser  Date Value Ref Range Status  12/08/2019 2.64 (H) 0.61 - 1.24 mg/dL Final   Creatinine, Urine  Date Value Ref Range Status  09/16/2019 42.63 mg/dL Final          Failed - Last BP in normal range    BP Readings from Last 1 Encounters:  12/08/19 (!) 157/74          Passed - K in normal range and within 360 days    Potassium  Date Value Ref Range Status  12/08/2019 4.4 3.5 - 5.1 mmol/L Final          Passed - Ca in normal range and within 360 days    Calcium  Date Value Ref Range Status  12/08/2019 9.2 8.9 - 10.3 mg/dL Final   Calcium, Total (PTH)  Date Value Ref Range Status  12/08/2019 8.8 8.7 - 10.2 mg/dL Final   Calcium, Ion  Date Value Ref Range Status  04/09/2010 1.11 (L) 1.12 - 1.32 mmol/L Final          Passed - Na in normal range and within 360 days    Sodium  Date Value Ref Range Status   12/08/2019 139 135 - 145 mmol/L Final  10/11/2019 138 134 - 144 mmol/L Final          Passed - Valid encounter within last 6 months    Recent Outpatient Visits           2 months ago Type 2 diabetes mellitus with other circulatory complication, with long-term current use of insulin (HCC)   [place], [place], MD   5 months ago Type 2 diabetes mellitus with other circulatory complication, with long-term current use of insulin ([place])   [place], [place], MD   8 months ago Type 2 diabetes mellitus with other circulatory complication, with long-term current use of insulin ([place])   [place], [place], MD   11 months ago Type 2 diabetes mellitus with hypoglycemia without coma, with long-term current use of insulin ([place])   [place], Charlane Ferretti, MD   1 year ago Medication management   [place], RPH-CPP       Future Appointments             In 1 month [name], MD [place]   In 2 months Troy Sine, MD [facility] [place], [place]

## 2019-12-13 NOTE — Telephone Encounter (Signed)
Medication Refill - Medication: hydralazine, furosemide   Has the patient contacted their pharmacy? Yes.  PT states that he only has 1 day left of the medications. Please advise.  (Agent: If no, request that the patient contact the pharmacy for the refill.) (Agent: If yes, when and what did the pharmacy advise?)  Preferred Pharmacy (with phone number or street name):  [facility] [address], [place] - [address]  [address] [place] [address]  Phone: [phone] Fax: [phone]  Hours: Not open 24 hours     Agent: Please be advised that RX refills may take up to 3 business days. We ask that you follow-up with your pharmacy.

## 2019-12-15 ENCOUNTER — Other Ambulatory Visit: Payer: Self-pay

## 2019-12-15 ENCOUNTER — Encounter (HOSPITAL_BASED_OUTPATIENT_CLINIC_OR_DEPARTMENT_OTHER): Payer: Medicaid Other | Admitting: Internal Medicine

## 2019-12-15 DIAGNOSIS — E11621 Type 2 diabetes mellitus with foot ulcer: Secondary | ICD-10-CM | POA: Diagnosis not present

## 2019-12-15 DIAGNOSIS — L97512 Non-pressure chronic ulcer of other part of right foot with fat layer exposed: Secondary | ICD-10-CM | POA: Diagnosis not present

## 2019-12-18 NOTE — Progress Notes (Signed)
SEBASTIEN, JACKSON T ([id number]) . Visit Report for 12/15/2019 Arrival Information Details Patient Name: Date of Service: [name] 12/15/2019 12:45 PM Medical Record Number: [id number] Patient Account Number: [id number] Date of Birth/Sex: Treating RN: 07-18-1960 (59 y.o. [name] Primary Care Laquan Beier: [name] Other Clinician: Referring Neeva Trew: Treating Eloni Darius/Extender: [name] Weeks in Treatment: 7 Visit Information History Since Last Visit Added or deleted any medications: No Patient Arrived: Ambulatory Any new allergies or adverse reactions: No Arrival Time: 12:58 Had a fall or experienced change in No Accompanied By: self activities of daily living that may affect Transfer Assistance: None risk of falls: Patient Identification Verified: Yes Signs or symptoms of abuse/neglect since last visito No Secondary Verification Process Completed: Yes Hospitalized since last visit: No Patient Requires Transmission-Based Precautions: No Implantable device outside of the clinic excluding No Patient Has Alerts: No cellular tissue based products placed in the center since last visit: Has Dressing in Place as Prescribed: Yes Pain Present Now: Yes Electronic Signature(s) Signed: 12/18/2019 8:02:18 AM By: Sandre Kitty Entered By: Sandre Kitty on 12/15/2019 12:58:57 -------------------------------------------------------------------------------- Compression Therapy Details Patient Name: Date of Service: [name] 12/15/2019 12:45 PM Medical Record Number: [id number] Patient Account Number: [id number] Date of Birth/Sex: Treating RN: 06-Aug-1960 (59 y.o. [name] Primary Care Saranya Harlin: [name] Other Clinician: Referring Regenia Erck: Treating Rosana Farnell/Extender: [name] Weeks in Treatment: 7 Compression Therapy Performed for Wound Assessment: Wound #13R Right,Lateral  Lower Leg Performed By: Clinician Deon Pilling, RN Compression Type: Four Layer Post Procedure Diagnosis Same as Pre-procedure Electronic Signature(s) Signed: 12/18/2019 1:33:30 PM By: Kela Millin Entered By: Kela Millin on 12/15/2019 13:37:18 -------------------------------------------------------------------------------- Compression Therapy Details Patient Name: Date of Service: [name] 12/15/2019 12:45 PM Medical Record Number: [id number] Patient Account Number: [id number] Date of Birth/Sex: Treating RN: 1960/12/13 (59 y.o. [name] Primary Care Shariya Gaster: [name] Other Clinician: Referring Addiel Mccardle: Treating Bentley Fissel/Extender: [name] Weeks in Treatment: 7 Compression Therapy Performed for Wound Assessment: Wound #15 Left,Posterior Lower Leg Performed By: Clinician Deon Pilling, RN Compression Type: Four Layer Post Procedure Diagnosis Same as Pre-procedure Electronic Signature(s) Signed: 12/18/2019 1:33:30 PM By: Kela Millin Entered By: Kela Millin on 12/15/2019 13:37:18 -------------------------------------------------------------------------------- Compression Therapy Details Patient Name: Date of Service: [name] 12/15/2019 12:45 PM Medical Record Number: [id number] Patient Account Number: [id number] Date of Birth/Sex: Treating RN: [birth year] (59 y.o. [name] Primary Care Jourdin Connors: [name] Other Clinician: Referring Tamir Wallman: Treating Samanthajo Payano/Extender: [name] Weeks in Treatment: 7 Compression Therapy Performed for Wound Assessment: Wound #16 Right,Anterior Lower Leg Performed By: Clinician Deon Pilling, RN Compression Type: Four Layer Post Procedure Diagnosis Same as Pre-procedure Electronic Signature(s) Signed: 12/18/2019 1:33:30 PM By: Kela Millin Entered By: Kela Millin on 12/15/2019  13:37:18 -------------------------------------------------------------------------------- Compression Therapy Details Patient Name: Date of Service: [name] 12/15/2019 12:45 PM Medical Record Number: [id number] Patient Account Number: [id number] Date of Birth/Sex: Treating RN: [birth year] (59 y.o. [name] Primary Care Jamerson Vonbargen: [name] Other Clinician: Referring Sou Nohr: Treating Demetric Dunnaway/Extender: [name] Weeks in Treatment: 7 Compression Therapy Performed for Wound Assessment: Wound #7 Right,Lateral Foot Performed By: Clinician Deon Pilling, RN Compression Type: Four Layer Post Procedure Diagnosis Same as Pre-procedure Electronic Signature(s) Signed: 12/18/2019 1:33:30 PM By: Kela Millin Signed: 12/18/2019 1:33:30 PM By: Kela Millin Entered By: Kela Millin on 12/15/2019 13:37:18 -------------------------------------------------------------------------------- Encounter Discharge Information Details Patient Name: Date of Service:  BRO WN, [name] 12/15/2019 12:45 PM Medical Record Number: [id number] Patient Account Number: [id number] Date of Birth/Sex: Treating RN: November 15, 1960 (59 y.o. [name] Primary Care Hezzie Karim: [name] Other Clinician: Referring Janiylah Hannis: Treating Amity Roes/Extender: [name] Weeks in Treatment: 7 Encounter Discharge Information Items Post Procedure Vitals Discharge Condition: Stable Temperature (F): 98.4 Ambulatory Status: Ambulatory Pulse (bpm): 80 Discharge Destination: Home Respiratory Rate (breaths/min): 22 Transportation: Private Auto Blood Pressure (mmHg): 155/79 Accompanied By: self Schedule Follow-up Appointment: Yes Clinical Summary of Care: Electronic Signature(s) Signed: 12/15/2019 5:49:45 PM By: Deon Pilling Entered By: Deon Pilling on 12/15/2019  15:24:43 -------------------------------------------------------------------------------- Lower Extremity Assessment Details Patient Name: Date of Service: [name] 12/15/2019 12:45 PM Medical Record Number: [id number] Patient Account Number: [id number] Date of Birth/Sex: Treating RN: 1960/06/03 (59 y.o. [name] Primary Care Alto Gandolfo: [name] Other Clinician: Referring Charlane Westry: Treating Nicol Herbig/Extender: [name] Weeks in Treatment: 7 Edema Assessment Assessed: [Left: No] [Right: No] Edema: [Left: Yes] [Right: Yes] Calf Left: Right: Point of Measurement: 48 cm From Medial Instep 49 cm 27.5 cm Ankle Left: Right: Point of Measurement: 14 cm From Medial Instep 28 cm 22 cm Vascular Assessment Pulses: Dorsalis Pedis Palpable: [Left:Yes] [Right:Yes] Electronic Signature(s) Signed: 12/18/2019 5:09:36 PM By: [name] RN, BSN Entered By: [name] on 12/15/2019 13:21:49 -------------------------------------------------------------------------------- Multi Wound Chart Details Patient Name: Date of Service: [name] 12/15/2019 12:45 PM Medical Record Number: [id number] Patient Account Number: [id number] Date of Birth/Sex: Treating RN: [birth year] (59 y.o. [name] Primary Care Genelda Roark: [name] Other Clinician: Referring Jarone Ostergaard: Treating Alithia Zavaleta/Extender: [name] Weeks in Treatment: 7 Vital Signs Height(in): 74 Pulse(bpm): 80 Weight(lbs): 345 Blood Pressure(mmHg): 155/79 Body Mass Index(BMI): 44 Temperature(F): 98.4 Respiratory Rate(breaths/min): 22 Photos: [13R:No Photos Right, Lateral Lower Leg] [14:No 73 Left T Second oe] [15:No Photos Left, Posterior Lower Leg] Wound Location: [13R:Gradually Appeared] [14:Trauma] [15:Gradually Appeared] Wounding Event: [13R:Venous Leg Ulcer] [14:Diabetic Wound/Ulcer of the Lower] [15:Venous Leg  Ulcer] Primary Etiology: [13R:Congestive Heart Failure,] [14:Extremity Congestive Heart Failure,] [15:Congestive Heart Failure,] Comorbid History: [13R:Hypertension, Type II Diabetes 11/17/2019] [14:Hypertension, Type II Diabetes 12/01/2019] [15:Hypertension, Type II Diabetes 12/08/2019] Date A cquired: [13R:3] [14:1] [15:1] Weeks of Treatment: [13R:Open] [14:Healed - Epithelialized] [15:Open] Wound Status: [13R:Yes] [14:No] [15:No] Wound Recurrence: [13R:Yes] [14:No] [15:No] Clustered Wound: [13R:3] [14:N/A] [15:N/A] Clustered Quantity: [13R:1.2x1.3x0.1] [14:0x0x0] [15:0.5x0.5x0.1] Measurements L x W x D (cm) [13R:1.225] [14:0] [15:0.196] A (cm) : rea [13R:0.123] [14:0] [15:0.02] Volume (cm) : [13R:-73.30%] [14:100.00%] [15:61.00%] % Reduction in A [13R:rea: -73.20%] [14:100.00%] [15:60.00%] % Reduction in Volume: [13R:Full Thickness Without Exposed] [14:Grade 2] [15:Full Thickness Without Exposed] Classification: [13R:Support Structures Medium] [14:None Present] [15:Support Structures Small] Exudate A mount: [13R:Serous] [14:N/A] [15:Serous] Exudate Type: [13R:amber] [14:N/A] [15:amber] Exudate Color: [13R:Flat and Intact] [14:Distinct, outline attached] [15:Distinct, outline attached] Wound Margin: [13R:Large (67-100%)] [14:None Present (0%)] [15:Large (67-100%)] Granulation A mount: [13R:Pink] [14:N/A] [15:Pink, Pale] Granulation Quality: [13R:None Present (0%)] [14:None Present (0%)] [15:None Present (0%)] Necrotic A mount: [13R:Fat Layer (Subcutaneous Tissue): Yes Fascia: No] [15:Fat Layer (Subcutaneous Tissue): Yes] Exposed Structures: [13R:Fascia: No Tendon: No Muscle: No Joint: No Bone: No Medium (34-66%)] [14:Fat Layer (Subcutaneous Tissue): No Tendon: No Muscle: No Joint: No Bone: No Large (67-100%)] [15:Fascia: No Tendon: No Muscle: No Joint: No Bone: No Medium (34-66%)] Epithelialization: [13R:N/A] [14:N/A] [15:N/A] Debridement: [13R:N/A] [14:N/A] [15:N/A] Pain Control:  [13R:N/A] [14:N/A] [15:N/A] Level: [13R:N/A] [14:N/A] [15:N/A] Debridement A (sq cm): [13R:rea N/A] [14:N/A] [15:N/A] Instrument: [13R:N/A] [14:N/A] [15:N/A] Bleeding: [  13R:N/A] [14:N/A] [15:N/A] Hemostasis A chieved: [13R:N/A] [14:N/A] [15:N/A] Procedural Pain: [13R:N/A] [14:N/A] [15:N/A] Post Procedural Pain: Debridement Treatment Response: N/A [14:N/A] [15:N/A] Post Debridement Measurements L x N/A [14:N/A] [15:N/A] W x D (cm) [13R:N/A] [14:N/A] [15:N/A] Post Debridement Volume: (cm) [13R:Compression Therapy] [14:N/A] [15:Compression [place] Wound Number: [id number] 7 N/A Photos: No Photos No Photos N/A Right, Anterior Lower Leg Right, Lateral Foot N/A Wound Location: Shear/Friction Gradually Appeared N/A Wounding Event: Skin Tear Diabetic Wound/Ulcer of the Lower N/A Primary Etiology: Extremity Congestive Heart Failure, Congestive Heart Failure, N/A Comorbid History: Hypertension, Type II Diabetes Hypertension, Type II Diabetes 12/15/2019 08/02/2019 N/A Date Acquired: 0 7 N/A Weeks of Treatment: Open Open N/A Wound Status: No No N/A Wound Recurrence: No No N/A Clustered Wound: N/A N/A N/A Clustered Quantity: 9.5x2x0.1 2.5x1.7x0.2 N/A Measurements L x W x D (cm) 14.923 3.338 N/A A (cm) : rea 1.492 0.668 N/A Volume (cm) : 0.00% 68.50% N/A % Reduction in A rea: 0.00% 91.00% N/A % Reduction in Volume: Full Thickness Without Exposed Grade 2 N/A Classification: Support Structures Medium Medium N/A Exudate A mount: Serosanguineous Serosanguineous N/A Exudate Type: red, Malecha red, Rodger N/A Exudate Color: Flat and Intact Flat and Intact N/A Wound Margin: Large (67-100%) Large (67-100%) N/A Granulation A mount: Pink Red N/A Granulation Quality: None Present (0%) Small (1-33%) N/A Necrotic A mount: Fat Layer (Subcutaneous Tissue): Yes Fat Layer (Subcutaneous Tissue): Yes N/A Exposed Structures: Fascia: No Fascia: No Tendon: No Tendon: No Muscle: No Muscle:  No Joint: No Joint: No Bone: No Bone: No None Medium (34-66%) N/A Epithelialization: N/A Debridement - Selective/Open Wound N/A Debridement: Pre-procedure Verification/Time Out N/A 13:34 N/A Taken: N/A Other N/A Pain Control: N/A Non-Viable Tissue N/A Level: N/A 4.25 N/A Debridement A (sq cm): rea N/A Curette N/A Instrument: N/A Moderate N/A Bleeding: N/A Silver Nitrate N/A Hemostasis A chieved: N/A 0 N/A Procedural Pain: N/A 0 N/A Post Procedural Pain: N/A Procedure was tolerated well N/A Debridement Treatment Response: N/A 2.5x1.7x0.2 N/A Post Debridement Measurements L x W x D (cm) N/A 0.668 N/A Post Debridement Volume: (cm) Compression Therapy Compression Therapy N/A Procedures Performed: Debridement Treatment Notes Electronic Signature(s) Signed: 12/18/2019 8:08:43 AM By: Linton Ham MD Signed: 12/18/2019 1:33:30 PM By: Kela Millin Entered By: Linton Ham on 12/15/2019 13:53:34 -------------------------------------------------------------------------------- Multi-Disciplinary Care Plan Details Patient Name: Date of Service: [name], [name] 12/15/2019 12:45 PM Medical Record Number: [id number] Patient Account Number: [id number] Date of Birth/Sex: Treating RN: 04-25-1960 (59 y.o. [name] Primary Care Aaleah Hirsch: [name] Other Clinician: Referring Unika Nazareno: Treating Tammye Kahler/Extender: [name] Weeks in Treatment: 7 Active Inactive Abuse / Safety / Falls / Self Care Management Nursing Diagnoses: Potential for falls Potential for injury related to falls Goals: Patient will remain injury free related to falls Date Initiated: 10/23/2019 Target Resolution Date: 01/05/2020 Goal Status: Active Patient/caregiver will verbalize/demonstrate measures taken to prevent injury and/or falls Date Initiated: 10/23/2019 Target Resolution Date: 01/05/2020 Goal Status: Active Interventions: Assess  Activities of Daily Living upon admission and as needed Assess fall risk on admission and as needed Assess: immobility, friction, shearing, incontinence upon admission and as needed Assess impairment of mobility on admission and as needed per policy Assess personal safety and home safety (as indicated) on admission and as needed Assess self care needs on admission and as needed Provide education on fall prevention Provide education on personal and home safety Notes: Nutrition Nursing Diagnoses: Impaired glucose control: actual or potential Potential for alteratiion in Nutrition/Potential for imbalanced nutrition Goals:  Patient/caregiver agrees to and verbalizes understanding of need to use nutritional supplements and/or vitamins as prescribed Date Initiated: 10/23/2019 Target Resolution Date: 12/15/2019 Goal Status: Active Patient/caregiver will maintain therapeutic glucose control Date Initiated: 10/23/2019 Target Resolution Date: 12/15/2019 Goal Status: Active Interventions: Assess HgA1c results as ordered upon admission and as needed Assess patient nutrition upon admission and as needed per policy Provide education on elevated blood sugars and impact on wound healing Provide education on nutrition Treatment Activities: Education provided on Nutrition : 11/03/2019 Notes: Venous Leg Ulcer Nursing Diagnoses: Knowledge deficit related to disease process and management Potential for venous Insuffiency (use before diagnosis confirmed) Goals: Patient will maintain optimal edema control Date Initiated: 10/23/2019 Target Resolution Date: 01/05/2020 Goal Status: Active Patient/caregiver will verbalize understanding of disease process and disease management Date Initiated: 10/23/2019 Target Resolution Date: 01/05/2020 Goal Status: Active Interventions: Assess peripheral edema status every visit. Compression as ordered Provide education on venous insufficiency Notes: Wound/Skin  Impairment Nursing Diagnoses: Impaired tissue integrity Knowledge deficit related to ulceration/compromised skin integrity Goals: Patient/caregiver will verbalize understanding of skin care regimen Date Initiated: 10/23/2019 Target Resolution Date: 01/05/2020 Goal Status: Active Interventions: Assess patient/caregiver ability to obtain necessary supplies Assess patient/caregiver ability to perform ulcer/skin care regimen upon admission and as needed Assess ulceration(s) every visit Provide education on ulcer and skin care Notes: Electronic Signature(s) Signed: 12/18/2019 1:33:30 PM By: Kela Millin Entered By: Kela Millin on 12/15/2019 13:24:19 -------------------------------------------------------------------------------- Pain Assessment Details Patient Name: Date of Service: [name] 12/15/2019 12:45 PM Medical Record Number: [id number] Patient Account Number: [id number] Date of Birth/Sex: Treating RN: April 07, 1960 (59 y.o. [name] Primary Care Makhya Arave: [name] Other Clinician: Referring Arriyanna Mersch: Treating Deondrae Mcgrail/Extender: [name] Weeks in Treatment: 7 Active Problems Location of Pain Severity and Description of Pain Patient Has Paino Yes Site Locations Rate the pain. Current Pain Level: 8 Pain Management and Medication Current Pain Management: Electronic Signature(s) Signed: 12/18/2019 8:02:18 AM By: Sandre Kitty Signed: 12/18/2019 1:33:30 PM By: Kela Millin Entered By: Sandre Kitty on 12/15/2019 12:59:59 -------------------------------------------------------------------------------- Patient/Caregiver Education Details Patient Name: Date of Service: [name] 9/17/2021andnbsp12:45 PM Medical Record Number: [id number] Patient Account Number: [id number] Date of Birth/Gender: Treating RN: January 02, 1961 (59 y.o. [name] Primary Care Physician: [name] Other Clinician: Referring Physician: Treating Physician/Extender: [name] in Treatment: 7 Education Assessment Education Provided To: Patient Education Topics Provided Wound/Skin Impairment: Handouts: Caring for Your Ulcer Methods: Explain/Verbal Responses: State content correctly Electronic Signature(s) Signed: 12/18/2019 1:33:30 PM By: Kela Millin Entered By: Kela Millin on 12/15/2019 13:24:35 -------------------------------------------------------------------------------- Wound Assessment Details Patient Name: Date of Service: [name] 12/15/2019 12:45 PM Medical Record Number: [id number] Patient Account Number: [id number] Date of Birth/Sex: Treating RN: 08-Dec-1960 (59 y.o. [name] Primary Care Jawann Urbani: [name] Other Clinician: Referring Mariapaula Krist: Treating Prem Coykendall/Extender: [name] Weeks in Treatment: 7 Wound Status Wound Number: [id number] Primary Etiology: Venous Leg Ulcer Wound Location: Right, Lateral Lower Leg Wound Status: Open Wounding Event: Gradually Appeared Comorbid History: Congestive Heart Failure, Hypertension, Type II Diabetes Date Acquired: 11/17/2019 Weeks Of Treatment: 3 Clustered Wound: Yes Wound Measurements Length: (cm) 1.2 Width: (cm) 1.3 Depth: (cm) 0.1 Clustered Quantity: 3 Area: (cm) 1.225 Volume: (cm) 0.123 % Reduction in Area: -73.3% % Reduction in Volume: -73.2% Epithelialization: Medium (34-66%) Tunneling: No Undermining: No Wound Description Classification: Full Thickness Without Exposed Support Structures Wound Margin: Flat and Intact Exudate Amount: Medium Exudate Type: Serous Exudate  Color: amber Foul Odor After Cleansing: No Slough/Fibrino No Wound Bed Granulation Amount: Large (67-100%) Exposed Structure Granulation Quality: Pink Fascia Exposed: No Necrotic Amount: None Present (0%) Fat Layer (Subcutaneous  Tissue) Exposed: Yes Tendon Exposed: No Muscle Exposed: No Joint Exposed: No Bone Exposed: No Treatment Notes Wound #13R (Right, Lateral Lower Leg) 1. Cleanse With Wound Cleanser Soap and water 2. Periwound Care Moisturizing lotion 3. Primary Dressing Applied Calcium Alginate Ag 4. Secondary Dressing ABD Pad 6. Support Layer Applied 4 layer compression Water quality scientist) Signed: 12/18/2019 1:33:30 PM By: Kela Millin Signed: 12/18/2019 5:09:36 PM By: [name] RN, BSN Entered By: [name] on 12/15/2019 13:22:31 -------------------------------------------------------------------------------- Wound Assessment Details Patient Name: Date of Service: [name] 12/15/2019 12:45 PM Medical Record Number: [id number] Patient Account Number: [id number] Date of Birth/Sex: Treating RN: [birth year] (59 y.o. [name] Primary Care Dilyn Osoria: [name] Other Clinician: Referring Tinaya Ceballos: Treating Melady Chow/Extender: [name] Weeks in Treatment: 7 Wound Status Wound Number: 14 Primary Etiology: Diabetic Wound/Ulcer of the Lower Extremity Wound Location: Left T Second oe Wound Status: Healed - Epithelialized Wounding Event: Trauma Comorbid History: Congestive Heart Failure, Hypertension, Type II Diabetes Date Acquired: 12/01/2019 Weeks Of Treatment: 1 Clustered Wound: No Wound Measurements Length: (cm) Width: (cm) Depth: (cm) Area: (cm) Volume: (cm) 0 % Reduction in Area: 100% 0 % Reduction in Volume: 100% 0 Epithelialization: Large (67-100%) 0 Tunneling: No 0 Undermining: No Wound Description Classification: Grade 2 Wound Margin: Distinct, outline attached Exudate Amount: None Present Foul Odor After Cleansing: No Slough/Fibrino No Wound Bed Granulation Amount: None Present (0%) Exposed Structure Necrotic Amount: None Present (0%) Fascia Exposed: No Fat Layer (Subcutaneous Tissue) Exposed:  No Tendon Exposed: No Muscle Exposed: No Joint Exposed: No Bone Exposed: No Electronic Signature(s) Signed: 12/18/2019 1:33:30 PM By: Kela Millin Signed: 12/18/2019 5:09:36 PM By: [name] RN, BSN Entered By: [name] on 12/15/2019 13:22:47 -------------------------------------------------------------------------------- Wound Assessment Details Patient Name: Date of Service: [name] 12/15/2019 12:45 PM Medical Record Number: [id number] Patient Account Number: [id number] Date of Birth/Sex: Treating RN: 02-03-1961 (59 y.o. [name] Primary Care Delania Ferg: [name] Other Clinician: Referring Danikah Budzik: Treating Marialy Urbanczyk/Extender: [name] Weeks in Treatment: 7 Wound Status Wound Number: 15 Primary Etiology: Venous Leg Ulcer Wound Location: Left, Posterior Lower Leg Wound Status: Open Wounding Event: Gradually Appeared Comorbid History: Congestive Heart Failure, Hypertension, Type II Diabetes Date Acquired: 12/08/2019 Weeks Of Treatment: 1 Clustered Wound: No Wound Measurements Length: (cm) 0.5 Width: (cm) 0.5 Depth: (cm) 0.1 Area: (cm) 0.196 Volume: (cm) 0.02 % Reduction in Area: 61% % Reduction in Volume: 60% Epithelialization: Medium (34-66%) Tunneling: No Undermining: No Wound Description Classification: Full Thickness Without Exposed Support Structures Wound Margin: Distinct, outline attached Exudate Amount: Small Exudate Type: Serous Exudate Color: amber Foul Odor After Cleansing: No Slough/Fibrino No Wound Bed Granulation Amount: Large (67-100%) Exposed Structure Granulation Quality: Pink, Pale Fascia Exposed: No Necrotic Amount: None Present (0%) Fat Layer (Subcutaneous Tissue) Exposed: Yes Tendon Exposed: No Muscle Exposed: No Joint Exposed: No Bone Exposed: No Treatment Notes Wound #15 (Left, Posterior Lower Leg) 1. Cleanse With Wound Cleanser Soap and water 2. Periwound  Care Moisturizing lotion 3. Primary Dressing Applied Calcium Alginate Ag 4. Secondary Dressing ABD Pad 6. Support Layer Applied 4 layer compression Water quality scientist) Signed: 12/18/2019 1:33:30 PM By: Kela Millin Signed: 12/18/2019 5:09:36 PM By: [name] RN, BSN Entered By: [name] on 12/15/2019 13:23:32 -------------------------------------------------------------------------------- Wound Assessment Details  Patient Name: Date of Service: [name] 12/15/2019 12:45 PM Medical Record Number: [id number] Patient Account Number: [id number] Date of Birth/Sex: Treating RN: [birth year] (59 y.o. [name] Primary Care Karl Knarr: [name] Other Clinician: Referring Keimora Swartout: Treating Elai Vanwyk/Extender: [name] Weeks in Treatment: 7 Wound Status Wound Number: 16 Primary Etiology: Skin Tear Wound Location: Right, Anterior Lower Leg Wound Status: Open Wounding Event: Shear/Friction Comorbid History: Congestive Heart Failure, Hypertension, Type II Diabetes Date Acquired: 12/15/2019 Weeks Of Treatment: 0 Clustered Wound: No Wound Measurements Length: (cm) 9.5 Width: (cm) 2 Depth: (cm) 0.1 Area: (cm) 14.923 Volume: (cm) 1.492 % Reduction in Area: 0% % Reduction in Volume: 0% Epithelialization: None Tunneling: No Undermining: No Wound Description Classification: Full Thickness Without Exposed Support Structures Wound Margin: Flat and Intact Exudate Amount: Medium Exudate Type: Serosanguineous Exudate Color: red, Henard Foul Odor After Cleansing: No Slough/Fibrino No Wound Bed Granulation Amount: Large (67-100%) Exposed Structure Granulation Quality: Pink Fascia Exposed: No Necrotic Amount: None Present (0%) Fat Layer (Subcutaneous Tissue) Exposed: Yes Tendon Exposed: No Muscle Exposed: No Joint Exposed: No Bone Exposed: No Treatment Notes Wound #16 (Right, Anterior Lower Leg) 1. Cleanse  With Wound Cleanser Soap and water 2. Periwound Care Moisturizing lotion 3. Primary Dressing Applied Calcium Alginate Ag 4. Secondary Dressing ABD Pad 6. Support Layer Applied 4 layer compression Water quality scientist) Signed: 12/18/2019 1:33:30 PM By: Kela Millin Signed: 12/18/2019 5:09:36 PM By: [name] RN, BSN Entered By: [name] on 12/15/2019 13:23:56 -------------------------------------------------------------------------------- Wound Assessment Details Patient Name: Date of Service: [name] 12/15/2019 12:45 PM Medical Record Number: [id number] Patient Account Number: [id number] Date of Birth/Sex: Treating RN: 1961-03-10 (59 y.o. [name] Primary Care Hideo Googe: [name] Other Clinician: Referring Khayman Kirsch: Treating Kaylin Marcon/Extender: [name] Weeks in Treatment: 7 Wound Status Wound Number: 7 Primary Etiology: Diabetic Wound/Ulcer of the Lower Extremity Wound Location: Right, Lateral Foot Wound Status: Open Wounding Event: Gradually Appeared Comorbid History: Congestive Heart Failure, Hypertension, Type II Diabetes Date Acquired: 08/02/2019 Weeks Of Treatment: 7 Clustered Wound: No Wound Measurements Length: (cm) 2.5 Width: (cm) 1.7 Depth: (cm) 0.2 Area: (cm) 3.338 Volume: (cm) 0.668 % Reduction in Area: 68.5% % Reduction in Volume: 91% Epithelialization: Medium (34-66%) Tunneling: No Undermining: No Wound Description Classification: Grade 2 Wound Margin: Flat and Intact Exudate Amount: Medium Exudate Type: Serosanguineous Exudate Color: red, [place] Foul Odor After Cleansing: No Slough/Fibrino Yes Wound Bed Granulation Amount: Large (67-100%) Exposed Structure Granulation Quality: Red Fascia Exposed: No Necrotic Amount: Small (1-33%) Fat Layer (Subcutaneous Tissue) Exposed: Yes Necrotic Quality: Adherent Slough Tendon Exposed: No Muscle Exposed: No Joint Exposed:  No Bone Exposed: No Treatment Notes Wound #7 (Right, Lateral Foot) 1. Cleanse With Wound Cleanser Soap and water 2. Periwound Care Moisturizing lotion 3. Primary Dressing Applied Hydrofera Blue 4. Secondary Dressing Dry Gauze 6. Support Layer Applied 4 layer compression Water quality scientist) Signed: 12/18/2019 1:33:30 PM By: Kela Millin Signed: 12/18/2019 5:09:36 PM By: [name] RN, BSN Entered By: [name] on 12/15/2019 13:24:13 -------------------------------------------------------------------------------- [place] Details Patient Name: Date of Service: [name], [place] T. 12/15/2019 12:45 PM Medical Record Number: [id number] Patient Account Number: [id number] Date of Birth/Sex: Treating RN: 06-30-1960 (59 y.o. [name] Primary Care Cedrick Partain: [name] Other Clinician: Referring Juanmanuel Marohl: Treating Mariko Nowakowski/Extender: [name] Weeks in Treatment: 7 Vital Signs Time Taken: 12:58 Temperature (F): 98.4 Height (in): 74 Pulse (bpm): 80 Weight (lbs): 345 Respiratory Rate (breaths/min): 22 Body  Mass Index (BMI): 44.3 Blood Pressure (mmHg): 155/79 Reference Range: 80 - 120 mg / dl Electronic Signature(s) Signed: 12/18/2019 8:02:18 AM By: Sandre Kitty Entered By: Sandre Kitty on 12/15/2019 12:59:42

## 2019-12-18 NOTE — Progress Notes (Signed)
[name], [name] ([id number]) . Visit Report for 12/15/2019 Debridement Details Patient Name: Date of Service: Jeffery Porter T. 12/15/2019 12:45 PM Medical Record Number: [id number] Patient Account Number: [id number] Date of Birth/Sex: Treating RN: [birth year] (59 y.o. Jeffery [name] Primary Care Provider: [name] Other Clinician: Referring Provider: Treating Provider/Extender: [name] Weeks in Treatment: 7 Debridement Performed for Assessment: Wound #7 Right,Lateral Foot Performed By: Physician Ricard Dillon., MD Debridement Type: Debridement Severity of Tissue Pre Debridement: Fat layer exposed Level of Consciousness (Pre-procedure): Awake and Alert Pre-procedure Verification/Time Out Yes - 13:34 Taken: Start Time: 13:34 Pain Control: Other : benzocaine, 20% T Area Debrided (L x W): otal 2.5 (cm) x 1.7 (cm) = 4.25 (cm) Tissue and other material debrided: Viable, Non-Viable, Subcutaneous, Hyper-granulation Level: Skin/Subcutaneous Tissue Debridement Description: Excisional Instrument: Curette Bleeding: Moderate Hemostasis Achieved: Silver Nitrate End Time: 13:34 Procedural Pain: 0 Post Procedural Pain: 0 Response to Treatment: Procedure was tolerated well Level of Consciousness (Post- Awake and Alert procedure): Post Debridement Measurements of Total Wound Length: (cm) 2.5 Width: (cm) 1.7 Depth: (cm) 0.2 Volume: (cm) 0.668 Character of Wound/Ulcer Post Debridement: Improved Severity of Tissue Post Debridement: Fat layer exposed Post Procedure Diagnosis Same as Pre-procedure Electronic Signature(s) Signed: 12/18/2019 8:08:43 AM By: Linton Ham MD Signed: 12/18/2019 1:33:30 PM By: Kela Millin Entered By: Linton Ham on 12/15/2019 13:58:29 -------------------------------------------------------------------------------- HPI Details Patient Name: Date of Service: Jeffery Porter T. 12/15/2019 12:45  PM Medical Record Number: [id number] Patient Account Number: [id number] Date of Birth/Sex: Treating RN: 10-30-1960 (59 y.o. Jeffery [name] Primary Care Provider: [name] Other Clinician: Referring Provider: Treating Provider/Extender: [name] Weeks in Treatment: 7 History of Present Illness HPI Description: ADMISSION 10/23/2019 This is a complex 59 year old man who is here with wounds on his bilateral lower legs feet and abrasion on his left forearm. These are generally of different etiology. He is a type II diabetic with peripheral neuropathy but does not have known PAD. Looking through [facility] health link I can see he was being followed by Dr. Earleen Newport earlier this year for a diabetic ulcer on the right foot. An MRI done in May showed no osteomyelitis. Shortly thereafter he developed cellulitis of the right leg and foot and was hospitalized from 5/10 through 5/19 with MSSA sepsis. During this hospitalization he was felt to have osteomyelitis he had debridement twice of the right foot and on one occasion apparently a placement of ACell. He again was hospitalized from 6/10 through 7/1 again with left lower extremity cellulitis increasing edema and acute renal failure. An MRI showed left leg cellulitis but no other major findings. 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 He was discharged with multiple wounds on his legs which apparently started as blisters secondary to fluid overload. His creatinine on 7/5 was 7.49 potassium 4 BUN 116 CO2 of 23 albumin of 2.9. He was seen by Dr. Moshe Cipro of nephrology medications were adjusted she is following him. 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 The patient arrives in clinic today with multiple wounds of different etiologies on his bilateral lower extremities. On the right leg he has superficial areas on the right medial and  lateral.. On his right lateral foot I think the original surgical wound. There is a small area at the base of his left second toe. On the left lower extremity a fairly large wound on the  left second toe which was trauma from the shower door. He has several areas on the legs. Which are superficial. Finally he has an abrasion injury on his left arm/skin tear Past medical history includes Charcot foot. Left third toe amputation, type 2 diabetes with peripheral neuropathy, paroxysmal A. fib on Eliquis, diastolic heart failure, acute on chronic renal failure as described. ABI in our clinic was 1.18 on the right 1.19 on the left 11/03/2019 upon evaluation today patient appears to be doing well with regard to his ulcers in general. He is making good progress. The worst is his larger right lateral foot ulcer. This wound is going require some sharp debridement today. Other than that he seems to be doing quite well. 11/10/19-Patient has denuded areas on both legs especially the right where there is more stasis changes and friable skin, areas on his right leg appear to be bigger than before, right lateral foot wound appears about the same. We are using Hydrofera Blue to the foot wound and silver alginate on the rest with 4 layer compression 8/19; patient's wounds on his bilateral anterior lower legs look a lot better. These appear to be progressing towards healing and some of them actually have healed. The most substantial area is on the right lateral foot. I thought this was his original surgical wound site. This has raised hyper granulation tissue and a completely nonviable surface. We have been using silver alginate to all the wounds on his legs under compression and Hydrofera Blue on the right lateral 8/27; most of the wounds are improving here except for the one on the right lateral foot. Still hyper granulation were using Hydrofera Blue in this area.he does not have an arterial issue. The wound on the right  lateral foot I think was an original surgical wound. I wonder whether he inverts at the ankle and not to complicate healing this. In review E does not have an arterial issue.MRI of the foot did not show osteomyelitis in May He still has several open wounds on the bilateral lower legs although these are a lot better. We are using silver alginate under for R compression 9/10; right lateral foot wound which is a surgical wound is still open. He still has bilateral small wounds question venous lymphedema. He developed a new one in the posterior left calf. We have his right leg in compression he has been wearing a compression garment on the left leg. We are going to wrap both legs today. He does not have an arterial issue. MRI did not suggest osteomyelitis of the right foot 9/17; right lateral foot wound still hyper granulated. He has superficial areas on the right anterior lower extremity which are a bit worse this week. There is nothing open on the left leg but the swelling on the left leg is worse than the right this looks like chronic venous insufficiency. We ordered him juxta lite stockings but he still has not received them. He tells me that his creatinine is improving he follows with nephrology. Electronic Signature(s) Signed: 12/18/2019 8:08:43 AM By: Linton Ham MD Entered By: Linton Ham on 12/15/2019 13:54:44 -------------------------------------------------------------------------------- Physical Exam Details Patient Name: Date of Service: Jeffery Porter T. 12/15/2019 12:45 PM Medical Record Number: [id number] Patient Account Number: [id number] Date of Birth/Sex: Treating RN: Jan 15, 1961 (59 y.o. Jeffery [name] Primary Care Provider: [name] Other Clinician: Referring Provider: Treating Provider/Extender: [name] Weeks in Treatment: 7 Constitutional Patient is hypertensive.. Pulse regular and within target range for  patient.Marland Kitchen  Respirations regular, non-labored and within target range.. Temperature is normal and within the target range for the patient.Marland Kitchen Appears in no distress. Cardiovascular Pedal pulses are palpable bilaterally. Nonpitting edema in the left leg with stasis dermatitis anteriorly bilaterally. There is no evidence of cellulitis or an acute DVT. Notes Wound exam Right lateral foot is his major open area. This has a clean surface but very irregular and hyper granulated. I used a #5 curette to remove the hyper granulated subcutaneous tissue I am hopeful that this will give a level surface for epithelialization He still has good edema control on the right but not the left even though we wrapped the left last time. He has some new superficial skin breakdown on the right anterior this time perhaps wrap injury. Electronic Signature(s) Signed: 12/18/2019 8:08:43 AM By: Linton Ham MD Entered By: Linton Ham on 12/15/2019 13:56:52 -------------------------------------------------------------------------------- Physician Orders Details Patient Name: Date of Service: Jeffery Porter T. 12/15/2019 12:45 PM Medical Record Number: [id number] Patient Account Number: [id number] Date of Birth/Sex: Treating RN: Feb 23, 1961 (59 y.o. Jeffery [name] Primary Care Provider: [name] Other Clinician: Referring Provider: Treating Provider/Extender: [name] Weeks in Treatment: 7 Verbal / Phone Orders: No Diagnosis Coding ICD-10 Coding Code Description E11.621 Type 2 diabetes mellitus with foot ulcer T81.31XA Disruption of external operation (surgical) wound, not elsewhere classified, initial encounter L97.811 Non-pressure chronic ulcer of other part of right lower leg limited to breakdown of skin L97.821 Non-pressure chronic ulcer of other part of left lower leg limited to breakdown of skin S90.812D Abrasion, left foot, subsequent encounter S50.812D Abrasion of left  forearm, subsequent encounter 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 Dressing Change Frequency Change dressing three times week. - all wounds (wound clinic to change on Friday, home health to change on Monday and Wednesday) Skin Barriers/Peri-Wound Care Moisturizing lotion - both legs Wound Cleansing Clean wound with Normal Saline. - or wound cleanser Primary Wound Dressing Wound #13R Right,Lateral Lower Leg Calcium Alginate with Silver Wound #15 Left,Posterior Lower Leg Calcium Alginate with Silver Wound #16 Right,Anterior Lower Leg Calcium Alginate with Silver Wound #7 Right,Lateral Foot Hydrofera Blue - Classic Secondary Dressing Dry Gauze - all wounds ABD pad - all wounds on legs Edema Control 4 layer compression - Bilateral - unna boot to top to anchor Avoid standing for long periods of time Elevate legs to the level of the heart or above for 30 minutes daily and/or when sitting, a frequency of: - throughout the day Exercise regularly [facility] for wound care. - [facility] Electronic Signature(s) Signed: 12/18/2019 8:08:43 AM By: Linton Ham MD Signed: 12/18/2019 1:33:30 PM By: Kela Millin Entered By: Kela Millin on 12/15/2019 13:36:30 -------------------------------------------------------------------------------- Problem List Details Patient Name: Date of Service: [name], [name] 12/15/2019 12:45 PM Medical Record Number: [id number] Patient Account Number: [id number] Date of Birth/Sex: Treating RN: [birth year] (59 y.o. Jeffery [name] Primary Care Provider: [name] Other Clinician: Referring Provider: Treating Provider/Extender: [name] Weeks in Treatment: 7 Active Problems ICD-10 Encounter Code  Description Active Date MDM Diagnosis E11.621 Type 2 diabetes mellitus with foot ulcer 10/23/2019 No Yes T81.31XA Disruption of external operation (surgical) wound, not elsewhere classified, 10/23/2019 No Yes initial encounter L97.811 Non-pressure chronic ulcer of other part of right lower leg limited to breakdown  10/23/2019 No Yes of skin L97.821 Non-pressure chronic ulcer of other part of left lower leg limited to breakdown 10/23/2019 No Yes of skin S90.812D Abrasion, left foot, subsequent encounter 10/23/2019 No Yes S50.812D Abrasion of left forearm, subsequent encounter 10/23/2019 No Yes E11.22 Type 2 diabetes mellitus with diabetic chronic kidney disease 10/23/2019 No Yes L97.512 Non-pressure chronic ulcer of other part of right foot with fat layer exposed 11/03/2019 No Yes Inactive Problems Resolved Problems Electronic Signature(s) Signed: 12/18/2019 8:08:43 AM By: Linton Ham MD Entered By: Linton Ham on 12/15/2019 13:53:16 -------------------------------------------------------------------------------- Progress Note Details Patient Name: Date of Service: Jeffery Porter T. 12/15/2019 12:45 PM Medical Record Number: [id number] Patient Account Number: [id number] Date of Birth/Sex: Treating RN: April 17, 1960 (59 y.o. Jeffery [name] Primary Care Provider: [name] Other Clinician: Referring Provider: Treating Provider/Extender: [name] Weeks in Treatment: 7 Subjective History of Present Illness (HPI) ADMISSION 10/23/2019 This is a complex 59 year old man who is here with wounds on his bilateral lower legs feet and abrasion on his left forearm. These are generally of different etiology. He is a type II diabetic with peripheral neuropathy but does not have known PAD. Looking through [facility] health link I can see he was being followed by Dr. Earleen Newport earlier this year for a diabetic ulcer on the right foot. An MRI done in May showed no osteomyelitis.  Shortly thereafter he developed cellulitis of the right leg and foot and was hospitalized from 5/10 through 5/19 with MSSA sepsis. During this hospitalization he was felt to have osteomyelitis he had debridement twice of the right foot and on one occasion apparently a placement of ACell. He again was hospitalized from 6/10 through 7/1 again with left lower extremity cellulitis increasing edema and acute renal failure. An MRI showed left leg cellulitis but no other major findings. 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 He was discharged with multiple wounds on his legs which apparently started as blisters secondary to fluid overload. His creatinine on 7/5 was 7.49 potassium 4 BUN 116 CO2 of 23 albumin of 2.9. He was seen by Dr. Moshe Cipro of nephrology medications were adjusted she is following him. 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 The patient arrives in clinic today with multiple wounds of different etiologies on his bilateral lower extremities. ooOn the right leg he has superficial areas on the right medial and lateral.. On his right lateral foot I think the original surgical wound. There is a small area at the base of his left second toe. ooOn the left lower extremity a fairly large wound on the left second toe which was trauma from the shower door. He has several areas on the legs. Which are superficial. ooFinally he has an abrasion injury on his left arm/skin tear Past medical history includes Charcot foot. Left third toe amputation, type 2 diabetes with peripheral neuropathy, paroxysmal A. fib on Eliquis, diastolic heart failure, acute on chronic renal failure as described. ABI in our clinic was 1.18 on the right 1.19 on the left 11/03/2019 upon evaluation today patient appears to be doing well with regard to his ulcers in general. He is making good progress. The worst is his larger  right lateral foot ulcer. This wound is going require some sharp debridement today. Other than that he seems to be doing quite well. 11/10/19-Patient has denuded areas on both legs especially the right where there is more stasis changes  and friable skin, areas on his right leg appear to be bigger than before, right lateral foot wound appears about the same. We are using Hydrofera Blue to the foot wound and silver alginate on the rest with 4 layer compression 8/19; patient's wounds on his bilateral anterior lower legs look a lot better. These appear to be progressing towards healing and some of them actually have healed. The most substantial area is on the right lateral foot. I thought this was his original surgical wound site. This has raised hyper granulation tissue and a completely nonviable surface. We have been using silver alginate to all the wounds on his legs under compression and Hydrofera Blue on the right lateral 8/27; most of the wounds are improving here except for the one on the right lateral foot. Still hyper granulation were using Hydrofera Blue in this area.he does not have an arterial issue. The wound on the right lateral foot I think was an original surgical wound. I wonder whether he inverts at the ankle and not to complicate healing this. In review E does not have an arterial issue.MRI of the foot did not show osteomyelitis in May He still has several open wounds on the bilateral lower legs although these are a lot better. We are using silver alginate under for R compression 9/10; right lateral foot wound which is a surgical wound is still open. He still has bilateral small wounds question venous lymphedema. He developed a new one in the posterior left calf. We have his right leg in compression he has been wearing a compression garment on the left leg. We are going to wrap both legs today. He does not have an arterial issue. MRI did not suggest osteomyelitis of the right foot 9/17;  right lateral foot wound still hyper granulated. He has superficial areas on the right anterior lower extremity which are a bit worse this week. There is nothing open on the left leg but the swelling on the left leg is worse than the right this looks like chronic venous insufficiency. We ordered him juxta lite stockings but he still has not received them. He tells me that his creatinine is improving he follows with nephrology. Objective Constitutional Patient is hypertensive.. Pulse regular and within target range for patient.Marland Kitchen Respirations regular, non-labored and within target range.. Temperature is normal and within the target range for the patient.Marland Kitchen Appears in no distress. Vitals Time Taken: 12:58 PM, Height: 74 in, Weight: 345 lbs, BMI: 44.3, Temperature: 98.4 F, Pulse: 80 bpm, Respiratory Rate: 22 breaths/min, Blood Pressure: 155/79 mmHg. Cardiovascular Pedal pulses are palpable bilaterally. Nonpitting edema in the left leg with stasis dermatitis anteriorly bilaterally. There is no evidence of cellulitis or an acute DVT. General Notes: Wound exam ooRight lateral foot is his major open area. This has a clean surface but very irregular and hyper granulated. I used a #5 curette to remove the hyper granulated subcutaneous tissue I am hopeful that this will give a level surface for epithelialization ooHe still has good edema control on the right but not the left even though we wrapped the left last time. ooHe has some new superficial skin breakdown on the right anterior this time perhaps wrap injury. Integumentary (Hair, Skin) Wound #13R status is Open. Original cause of wound was Gradually Appeared. The wound is located on the Right,Lateral Lower Leg. The wound measures 1.2cm length x 1.3cm width x 0.1cm depth; 1.225cm^2 area and 0.123cm^3 volume. There is Fat Layer (Subcutaneous Tissue) exposed. There is no tunneling  or undermining noted. There is a medium amount of serous drainage noted.  The wound margin is flat and intact. There is large (67-100%) pink granulation within the wound bed. There is no necrotic tissue within the wound bed. Wound #14 status is Healed - Epithelialized. Original cause of wound was Trauma. The wound is located on the Left T Second. The wound measures 0cm oe length x 0cm width x 0cm depth; 0cm^2 area and 0cm^3 volume. There is no tunneling or undermining noted. There is a none present amount of drainage noted. The wound margin is distinct with the outline attached to the wound base. There is no granulation within the wound bed. There is no necrotic tissue within the wound bed. Wound #15 status is Open. Original cause of wound was Gradually Appeared. The wound is located on the Left,Posterior Lower Leg. The wound measures 0.5cm length x 0.5cm width x 0.1cm depth; 0.196cm^2 area and 0.02cm^3 volume. There is Fat Layer (Subcutaneous Tissue) exposed. There is no tunneling or undermining noted. There is a small amount of serous drainage noted. The wound margin is distinct with the outline attached to the wound base. There is large (67-100%) pink, pale granulation within the wound bed. There is no necrotic tissue within the wound bed. Wound #16 status is Open. Original cause of wound was Shear/Friction. The wound is located on the Right,Anterior Lower Leg. The wound measures 9.5cm length x 2cm width x 0.1cm depth; 14.923cm^2 area and 1.492cm^3 volume. There is Fat Layer (Subcutaneous Tissue) exposed. There is no tunneling or undermining noted. There is a medium amount of serosanguineous drainage noted. The wound margin is flat and intact. There is large (67-100%) pink granulation within the wound bed. There is no necrotic tissue within the wound bed. Wound #7 status is Open. Original cause of wound was Gradually Appeared. The wound is located on the Right,Lateral Foot. The wound measures 2.5cm length x 1.7cm width x 0.2cm depth; 3.338cm^2 area and 0.668cm^3 volume.  There is Fat Layer (Subcutaneous Tissue) exposed. There is no tunneling or undermining noted. There is a medium amount of serosanguineous drainage noted. The wound margin is flat and intact. There is large (67-100%) red granulation within the wound bed. There is a small (1-33%) amount of necrotic tissue within the wound bed including Adherent Slough. 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 There was a Excisional Skin/Subcutaneous Tissue Debridement with a total area of 4.25 sq cm performed by Ricard Dillon., MD. With the following instrument(s): Curette to remove Viable and Non-Viable tissue/material. Material removed includes Subcutaneous Tissue and Hyper- granulation and after achieving pain control using Other (benzocaine, 20%). No specimens were taken. A time out was conducted at 13:34, prior to the start of the procedure. A Moderate amount of bleeding was controlled with Silver Nitrate. The procedure was tolerated well with a pain level of 0 throughout and  a pain level of 0 following the procedure. Post Debridement Measurements: 2.5cm length x 1.7cm width x 0.2cm depth; 0.668cm^3 volume. Character of Wound/Ulcer Post Debridement is improved. Severity of Tissue Post  Debridement is: Fat layer exposed. Post procedure Diagnosis Wound #7: Same as Pre-Procedure Pre-procedure diagnosis of Wound #7 is a Diabetic Wound/Ulcer of the Lower Extremity located on the Right,Lateral Foot . There was a Four Layer Compression Therapy Procedure by Deon Pilling, RN. Post procedure Diagnosis Wound #7: Same as Pre-Procedure Wound #13R Pre-procedure diagnosis of Wound #13R is a Venous Leg Ulcer located on the Right,Lateral Lower Leg . There was a Four Layer Compression Therapy Procedure by Deon Pilling, RN. Post procedure Diagnosis Wound #13R: Same as Pre-Procedure Wound #15 Pre-procedure diagnosis of Wound #15 is a Venous Leg Ulcer located on the Left,Posterior Lower Leg . There was a Four Layer Compression Therapy Procedure by Deon Pilling, RN. Post procedure Diagnosis Wound #15: Same as Pre-Procedure Wound #16 Pre-procedure diagnosis of Wound #16 is a Skin T located on the Right,Anterior Lower Leg . There was a Four Layer Compression Therapy Procedure by ear Deon Pilling, RN. Post procedure Diagnosis Wound #16: Same as Pre-Procedure Plan Follow-up Appointments: Return Appointment in 1 week. Dressing Change Frequency: Change dressing three times week. - all wounds (wound clinic to change on Friday, home health to change on Monday and Wednesday) Skin Barriers/Peri-Wound Care: Moisturizing lotion - both legs Wound Cleansing: Clean wound with Normal Saline. - or wound cleanser Primary Wound Dressing: Wound #13R Right,Lateral Lower Leg: Calcium Alginate with Silver Wound #15 Left,Posterior Lower Leg: Calcium Alginate with Silver Wound #16 Right,Anterior Lower Leg: Calcium Alginate with Silver Wound #7 Right,Lateral Foot: Hydrofera Blue - Classic Secondary Dressing: Dry Gauze - all wounds ABD pad - all wounds on legs Edema Control: 4 layer compression - Bilateral - unna boot to top to anchor Avoid standing for long periods of time Elevate legs to the level  of the heart or above for 30 minutes daily and/or when sitting, a frequency of: - throughout the day Exercise regularly Home Health: [place] [facility] for wound care. - [facility] 1. I continued with Hydrofera Blue to the right anterior foot 2. Silver alginate to the right anterior lower extremity wounds 3. It looks as though he has lymphedema on the left anterior leg with stasis dermatitis. I am still wrapping this pending a stocking. 4. We have much better edema control in the right leg but still stasis dermatitis anteriorly. 5. Both legs in 4 layer compression Electronic Signature(s) Signed: 12/15/2019 1:58:56 PM By: Linton Ham MD Entered By: Linton Ham on 12/15/2019 13:58:56 -------------------------------------------------------------------------------- SuperBill Details Patient Name: Date of Service: [name], Streeter T. 12/15/2019 Medical Record Number: [id number] Patient Account Number: [id number] Date of Birth/Sex: Treating RN: 1961-01-18 (59 y.o. Jeffery [name] Primary Care Provider: [name] Other Clinician: Referring Provider: Treating Provider/Extender: [name] Weeks in Treatment: 7 Diagnosis Coding ICD-10 Codes Code Description E11.621 Type 2 diabetes mellitus with foot ulcer T81.31XA Disruption of external operation (surgical) wound, not elsewhere classified, initial encounter L97.811 Non-pressure chronic ulcer of other part of right lower leg limited to breakdown of skin L97.821 Non-pressure chronic ulcer of other part of left lower leg limited to breakdown of skin S90.812D Abrasion, left foot, subsequent encounter S50.812D Abrasion of left forearm, subsequent encounter 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 Facility  Procedures CPT4 Code: 57897847 Description: 84128 - DEB SUBQ TISSUE 20  SQ CM/< ICD-10 Diagnosis Description L97.512 Non-pressure chronic ulcer of other part of right foot with fat layer exposed E11.621 Type 2 diabetes mellitus with foot ulcer Modifier: Quantity: 1 Physician Procedures : CPT4 Code Description Modifier [id number] 11042 - WC PHYS SUBQ TISS 20 SQ CM ICD-10 Diagnosis Description L97.512 Non-pressure chronic ulcer of other part of right foot with fat layer exposed E11.621 Type 2 diabetes mellitus with foot ulcer Quantity: 1 Electronic Signature(s) Signed: 12/18/2019 8:08:43 AM By: Linton Ham MD Entered By: Linton Ham on 12/15/2019 13:59:36

## 2019-12-20 ENCOUNTER — Other Ambulatory Visit: Payer: Self-pay | Admitting: Cardiovascular Disease

## 2019-12-20 NOTE — Telephone Encounter (Signed)
*  STAT* If patient is at the pharmacy, call can be transferred to refill team.   1. Which medications need to be refilled? (please list name of each medication and dose if known) hydrALAZINE (APRESOLINE) 50 MG tablet  2. Which pharmacy/location (including street and city if local pharmacy) is medication to be sent to? [facility] [address] [place] [address]  3. Do they need a 30 day or 90 day supply? 90 day Patient has been out for a week.

## 2019-12-21 MED ORDER — HYDRALAZINE HCL 50 MG PO TABS: 50.0000 mg | ORAL_TABLET | Freq: Three times a day (TID) | ORAL | 1 refills | Status: DC

## 2019-12-22 ENCOUNTER — Other Ambulatory Visit: Payer: Self-pay

## 2019-12-22 ENCOUNTER — Encounter (HOSPITAL_BASED_OUTPATIENT_CLINIC_OR_DEPARTMENT_OTHER): Payer: Medicaid Other | Admitting: Internal Medicine

## 2019-12-22 ENCOUNTER — Encounter (HOSPITAL_COMMUNITY)
Admission: RE | Admit: 2019-12-22 | Discharge: 2019-12-22 | Disposition: A | Discharge status: Home / Self Care | Payer: Medicaid Other | Source: Ambulatory Visit | Attending: Nephrology | Admitting: Nephrology

## 2019-12-22 DIAGNOSIS — E11621 Type 2 diabetes mellitus with foot ulcer: Secondary | ICD-10-CM | POA: Diagnosis not present

## 2019-12-22 DIAGNOSIS — N183 Chronic kidney disease, stage 3 unspecified: Secondary | ICD-10-CM | POA: Diagnosis not present

## 2019-12-22 LAB — POCT HEMOGLOBIN-HEMACUE: Hemoglobin: 10.8 g/dL — ABNORMAL LOW (ref 13.0–17.0)

## 2019-12-22 MED ORDER — EPOETIN ALFA-EPBX 10000 UNIT/ML IJ SOLN
INTRAMUSCULAR | Status: AC
  Filled 2019-12-22: qty 2

## 2019-12-22 MED ORDER — EPOETIN ALFA-EPBX 10000 UNIT/ML IJ SOLN
20000.0000 [IU] | Freq: Once | INTRAMUSCULAR | Status: DC
  Administered 2019-12-22: 20000 [IU] via SUBCUTANEOUS

## 2019-12-24 NOTE — Progress Notes (Signed)
EZRAH, PANNING [name] ([id number]) . Visit Report for 12/22/2019 Debridement Details Patient Name: Date of Service: Jeffery [name]. 12/22/2019 10:45 A M Medical Record Number: [id number] Patient Account Number: [id number] Date of Birth/Sex: Treating RN: 06/01/1960 (59 y.o. [name] Primary Care Provider: [name] Other Clinician: Referring Provider: Treating Provider/Extender: [name] Weeks in Treatment: 8 Debridement Performed for Assessment: Wound #7 Right,Lateral Foot Performed By: Physician Ricard Dillon., MD Debridement Type: Debridement Severity of Tissue Pre Debridement: Fat layer exposed Level of Consciousness (Pre-procedure): Awake and Alert Pre-procedure Verification/Time Out Yes - 11:05 Taken: Start Time: 11:05 Pain Control: Other : benzocaine, 20% [name] Area Debrided (L x W): otal 2.5 (cm) x 1.5 (cm) = 3.75 (cm) Tissue and other material debrided: Viable, Non-Viable, Subcutaneous Level: Skin/Subcutaneous Tissue Debridement Description: Excisional Instrument: Curette Bleeding: Minimum Hemostasis Achieved: Pressure End Time: 11:05 Procedural Pain: 0 Post Procedural Pain: 0 Response to Treatment: Procedure was tolerated well Level of Consciousness (Post- Awake and Alert procedure): Post Debridement Measurements of Total Wound Length: (cm) 2.5 Width: (cm) 1.5 Depth: (cm) 0.1 Volume: (cm) 0.295 Character of Wound/Ulcer Post Debridement: Improved Severity of Tissue Post Debridement: Fat layer exposed Post Procedure Diagnosis Same as Pre-procedure Electronic Signature(s) Signed: 12/22/2019 5:26:06 PM By: Kela Millin Signed: 12/24/2019 6:30:39 AM By: Linton Ham MD Entered By: Linton Ham on 12/22/2019 11:47:04 -------------------------------------------------------------------------------- HPI Details Patient Name: Date of Service: Jeffery [name]. 12/22/2019 10:45 A M Medical Record Number:  [id number] Patient Account Number: [id number] Date of Birth/Sex: Treating RN: [birth year] (59 y.o. [name] Primary Care Provider: [name] Other Clinician: Referring Provider: Treating Provider/Extender: [name] Weeks in Treatment: 8 History of Present Illness HPI Description: ADMISSION 10/23/2019 This is a complex 59 year old man who is here with wounds on his bilateral lower legs feet and abrasion on his left forearm. These are generally of different etiology. He is a type II diabetic with peripheral neuropathy but does not have known PAD. Looking through [facility] health link I can see he was being followed by Dr. Earleen Newport earlier this year for a diabetic ulcer on the right foot. An MRI done in May showed no osteomyelitis. Shortly thereafter he developed cellulitis of the right leg and foot and was hospitalized from 5/10 through 5/19 with MSSA sepsis. During this hospitalization he was felt to have osteomyelitis he had debridement twice of the right foot and on one occasion apparently a placement of ACell. He again was hospitalized from 6/10 through 7/1 again with left lower extremity cellulitis increasing edema and acute renal failure. An MRI showed left leg cellulitis but no other major findings. 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 He was discharged with multiple wounds on his legs which apparently started as blisters secondary to fluid overload. His creatinine on 7/5 was 7.49 potassium 4 BUN 116 CO2 of 23 albumin of 2.9. He was seen by Dr. Moshe Cipro of nephrology medications were adjusted she is following him. 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 The patient arrives in clinic today with multiple wounds of different etiologies on his bilateral lower extremities. On the right leg he has superficial areas on the right medial and lateral.. On his right lateral  foot I think the original surgical wound. There is a small area at the base of his left second toe. On the left lower extremity a fairly large wound on the  left second toe which was trauma from the shower door. He has several areas on the legs. Which are superficial. Finally he has an abrasion injury on his left arm/skin tear Past medical history includes Charcot foot. Left third toe amputation, type 2 diabetes with peripheral neuropathy, paroxysmal A. fib on Eliquis, diastolic heart failure, acute on chronic renal failure as described. ABI in our clinic was 1.18 on the right 1.19 on the left 11/03/2019 upon evaluation today patient appears to be doing well with regard to his ulcers in general. He is making good progress. The worst is his larger right lateral foot ulcer. This wound is going require some sharp debridement today. Other than that he seems to be doing quite well. 11/10/19-Patient has denuded areas on both legs especially the right where there is more stasis changes and friable skin, areas on his right leg appear to be bigger than before, right lateral foot wound appears about the same. We are using Hydrofera Blue to the foot wound and silver alginate on the rest with 4 layer compression 8/19; patient's wounds on his bilateral anterior lower legs look a lot better. These appear to be progressing towards healing and some of them actually have healed. The most substantial area is on the right lateral foot. I thought this was his original surgical wound site. This has raised hyper granulation tissue and a completely nonviable surface. We have been using silver alginate to all the wounds on his legs under compression and Hydrofera Blue on the right lateral 8/27; most of the wounds are improving here except for the one on the right lateral foot. Still hyper granulation were using Hydrofera Blue in this area.he does not have an arterial issue. The wound on the right lateral foot I think was an  original surgical wound. I wonder whether he inverts at the ankle and not to complicate healing this. In review E does not have an arterial issue.MRI of the foot did not show osteomyelitis in May He still has several open wounds on the bilateral lower legs although these are a lot better. We are using silver alginate under for R compression 9/10; right lateral foot wound which is a surgical wound is still open. He still has bilateral small wounds question venous lymphedema. He developed a new one in the posterior left calf. We have his right leg in compression he has been wearing a compression garment on the left leg. We are going to wrap both legs today. He does not have an arterial issue. MRI did not suggest osteomyelitis of the right foot 9/17; right lateral foot wound still hyper granulated. He has superficial areas on the right anterior lower extremity which are a bit worse this week. There is nothing open on the left leg but the swelling on the left leg is worse than the right this looks like chronic venous insufficiency. We ordered him juxta lite stockings but he still has not received them. He tells me that his creatinine is improving he follows with nephrology. 9/24; right lateral foot. The wound still has a very adherent necrotic surface although I am able to debride this to help something healthy. The area on the right anterior leg in the setting of chronic venous insufficiency and lymphedema is superficial and looks healthy. We will continue with Hydrofera Blue on the foot silver alginate on the leg. We could not get wraparound stockings because he has home health. They are changing the dressing. Home health will not wrap the left leg  because he does not have a wound. Electronic Signature(s) Signed: 12/24/2019 6:30:39 AM By: Linton Ham MD Entered By: Linton Ham on 12/22/2019 11:49:37 -------------------------------------------------------------------------------- Physical Exam  Details Patient Name: Date of Service: Jeffery [name]. 12/22/2019 10:45 A M Medical Record Number: [id number] Patient Account Number: [id number] Date of Birth/Sex: Treating RN: [birth year] (59 y.o. [name] Primary Care Provider: [name] Other Clinician: Referring Provider: Treating Provider/Extender: [name] Weeks in Treatment: 8 Constitutional Sitting or standing Blood Pressure is within target range for patient.. Pulse regular and within target range for patient.Marland Kitchen Respirations regular, non-labored and within target range.. Temperature is normal and within the target range for the patient.Marland Kitchen Appears in no distress. Cardiovascular Pedal pulses palpable and strong bilaterally.. Patient has some degree of chronic stasis dermatitis anteriorly nonpitting edema compatible with lymphedema in both legs. Notes Wound exam Right lateral foot again requires an extensive debridement he is developing recurrent adherent debris on the surface however post debridement this cleans up quite nicely with healthy granulated tissue which appears to be well-perfused On the right anterior lower leg the wound appears better superficial debrided with Anasept and gauze no mechanical debridement was necessary Electronic Signature(s) Signed: 12/24/2019 6:30:39 AM By: Linton Ham MD Entered By: Linton Ham on 12/22/2019 11:53:34 -------------------------------------------------------------------------------- Physician Orders Details Patient Name: Date of Service: Jeffery [name]. 12/22/2019 10:45 A M Medical Record Number: [id number] Patient Account Number: [id number] Date of Birth/Sex: Treating RN: 1960/06/23 (59 y.o. [name] Primary Care Provider: [name] Other Clinician: Referring Provider: Treating Provider/Extender: [name] Weeks in Treatment: 8 Verbal / Phone Orders: No Diagnosis Coding ICD-10  Coding Code Description E11.621 Type 2 diabetes mellitus with foot ulcer T81.31XA Disruption of external operation (surgical) wound, not elsewhere classified, initial encounter L97.811 Non-pressure chronic ulcer of other part of right lower leg limited to breakdown of skin L97.821 Non-pressure chronic ulcer of other part of left lower leg limited to breakdown of skin S90.812D Abrasion, left foot, subsequent encounter S50.812D Abrasion of left forearm, subsequent encounter 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 Dressing Change Frequency Change dressing three times week. - HH to change Monday and Thursday Skin Barriers/Peri-Wound Care Moisturizing lotion - both legs Wound Cleansing Clean wound with Normal Saline. - or wound cleanser Primary Wound Dressing Wound #16 Right,Anterior Lower Leg Calcium Alginate with Silver Wound #7 Right,Lateral Foot Hydrofera Blue - Classic Secondary Dressing Dry Gauze - all wounds ABD pad - all wounds on legs Edema Control 4 layer compression - Right Lower Extremity - unna boot to top to anchor Avoid standing for long periods of time Elevate legs to the level of the heart or above for 30 minutes daily and/or when sitting, a frequency of: - throughout the day Exercise regularly [place] skilled nursing for wound care. - [facility] Electronic Signature(s) Signed: 12/22/2019 5:26:06 PM By: Kela Millin Signed: 12/24/2019 6:30:39 AM By: Linton Ham MD Entered By: Kela Millin on 12/22/2019 11:14:23 -------------------------------------------------------------------------------- Problem List Details Patient Name: Date of Service: Jeffery [name]. 12/22/2019 10:45 A M Medical Record Number: [id number] Patient Account Number: [id number] Date of  Birth/Sex: Treating RN: 07/22/1960 (59 y.o. [name] Primary Care Provider: [name] Other Clinician: Referring Provider: Treating Provider/Extender: [name] Weeks in Treatment: 8 Active Problems ICD-10  Encounter Code Description Active Date MDM Diagnosis E11.621 Type 2 diabetes mellitus with foot ulcer 10/23/2019 No Yes T81.31XA Disruption of external operation (surgical) wound, not elsewhere classified, 10/23/2019 No Yes initial encounter L97.811 Non-pressure chronic ulcer of other part of right lower leg limited to breakdown 10/23/2019 No Yes of skin L97.821 Non-pressure chronic ulcer of other part of left lower leg limited to breakdown 10/23/2019 No Yes of skin S90.812D Abrasion, left foot, subsequent encounter 10/23/2019 No Yes E11.22 Type 2 diabetes mellitus with diabetic chronic kidney disease 10/23/2019 No Yes L97.512 Non-pressure chronic ulcer of other part of right foot with fat layer exposed 11/03/2019 No Yes Inactive Problems ICD-10 Code Description Active Date Inactive Date S50.812D Abrasion of left forearm, subsequent encounter 10/23/2019 10/23/2019 Resolved Problems Electronic Signature(s) Signed: 12/24/2019 6:30:39 AM By: Linton Ham MD Entered By: Linton Ham on 12/22/2019 11:55:47 -------------------------------------------------------------------------------- SuperBill Details Patient Name: Date of Service: Jeffery [name]. 12/22/2019 Medical Record Number: [id number] Patient Account Number: [id number] Date of Birth/Sex: Treating RN: 1961/03/15 (59 y.o. [name] Primary Care Provider: [name] Other Clinician: Referring Provider: Treating Provider/Extender: [name] Weeks in Treatment: 8 Diagnosis Coding ICD-10 Codes Code Description E11.621 Type 2 diabetes mellitus with foot ulcer T81.31XA Disruption of external operation (surgical) wound, not elsewhere  classified, initial encounter L97.811 Non-pressure chronic ulcer of other part of right lower leg limited to breakdown of skin L97.821 Non-pressure chronic ulcer of other part of left lower leg limited to breakdown of skin S90.812D Abrasion, left foot, subsequent encounter S50.812D Abrasion of left forearm, subsequent encounter 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 Facility Procedures CPT4 Code: [id number] Description: [place] - DEB SUBQ TISSUE 20 SQ CM/< ICD-10 Diagnosis Description L97.512 Non-pressure chronic ulcer of other part of right foot with fat layer exposed Modifier: Quantity: 1 Physician Procedures : CPT4 Code Description Modifier [id number] - WC PHYS SUBQ TISS 20 SQ CM ICD-10 Diagnosis Description L97.512 Non-pressure chronic ulcer of other part of right foot with fat layer exposed Quantity: 1 Electronic Signature(s) Signed: 12/22/2019 5:26:06 PM By: Kela Millin Signed: 12/24/2019 6:30:39 AM By: Linton Ham MD Entered By: Kela Millin on 12/22/2019 11:18:26

## 2019-12-25 ENCOUNTER — Other Ambulatory Visit: Payer: Self-pay

## 2019-12-25 ENCOUNTER — Ambulatory Visit: Payer: Medicaid Other | Admitting: Podiatry

## 2019-12-25 ENCOUNTER — Telehealth: Payer: Self-pay | Admitting: Family Medicine

## 2019-12-25 DIAGNOSIS — Z89422 Acquired absence of other left toe(s): Secondary | ICD-10-CM

## 2019-12-25 DIAGNOSIS — E11610 Type 2 diabetes mellitus with diabetic neuropathic arthropathy: Secondary | ICD-10-CM

## 2019-12-25 DIAGNOSIS — M79674 Pain in right toe(s): Secondary | ICD-10-CM | POA: Diagnosis not present

## 2019-12-25 DIAGNOSIS — B351 Tinea unguium: Secondary | ICD-10-CM

## 2019-12-25 DIAGNOSIS — M79675 Pain in left toe(s): Secondary | ICD-10-CM

## 2019-12-25 DIAGNOSIS — E1142 Type 2 diabetes mellitus with diabetic polyneuropathy: Secondary | ICD-10-CM

## 2019-12-25 NOTE — Telephone Encounter (Signed)
Copied from [place] [id number]. Topic: General - Call Back - No Documentation >> Dec 20, 2019  5:06 PM Erick Blinks wrote: Reason for CRM: Call back request, pt is missing Rx and wants to speak to office.  Best contact [phone]

## 2019-12-25 NOTE — Progress Notes (Signed)
Jeffery, [name] ([id number]) . Visit Report for 12/22/2019 Arrival Information Details Patient Name: Date of Service: Jeffery [name]. 12/22/2019 10:45 A M Medical Record Number: [id number] Patient Account Number: [id number] Date of Birth/Sex: Treating RN: [birth year] (59 y.o. Jeffery [name]) Carlene Coria Primary Care Wyndell Cardiff: [name] Other Clinician: Referring Rydell Wiegel: Treating Vicente Weidler/Extender: [name] Weeks in Treatment: 8 Visit Information History Since Last Visit All ordered tests and consults were completed: No Patient Arrived: Ambulatory Added or deleted any medications: No Arrival Time: 10:49 Any new allergies or adverse reactions: No Accompanied By: self Had a fall or experienced change in No Transfer Assistance: None activities of daily living that may affect Patient Identification Verified: Yes risk of falls: Secondary Verification Process Completed: Yes Signs or symptoms of abuse/neglect since last visito No Patient Requires Transmission-Based Precautions: No Hospitalized since last visit: No Patient Has Alerts: No Implantable device outside of the clinic excluding No cellular tissue based products placed in the center since last visit: Has Dressing in Place as Prescribed: Yes Has Compression in Place as Prescribed: Yes Pain Present Now: No Electronic Signature(s) Signed: 12/22/2019 4:51:09 PM By: Carlene Coria RN Entered By: Carlene Coria on 12/22/2019 10:50:12 -------------------------------------------------------------------------------- Compression Therapy Details Patient Name: Date of Service: Jeffery [name]. 12/22/2019 10:45 A M Medical Record Number: [id number] Patient Account Number: [id number] Date of Birth/Sex: Treating RN: May 22, 1960 (59 y.o. Jeffery [name] Primary Care Lugene Beougher: [name] Other Clinician: Referring Yerik Zeringue: Treating Jasmon Mattice/Extender: [name] Weeks in  Treatment: 8 Compression Therapy Performed for Wound Assessment: Wound #16 Right,Anterior Lower Leg Performed By: Clinician [name], RN Compression Type: Four Layer Post Procedure Diagnosis Same as Pre-procedure Electronic Signature(s) Signed: 12/22/2019 5:26:06 PM By: Kela Millin Entered By: Kela Millin on 12/22/2019 11:16:53 -------------------------------------------------------------------------------- Compression Therapy Details Patient Name: Date of Service: Jeffery [name]. 12/22/2019 10:45 A M Medical Record Number: [id number] Patient Account Number: [id number] Date of Birth/Sex: Treating RN: 08/21/1960 (59 y.o. Jeffery [name] Primary Care Arlean Thies: [name] Other Clinician: Referring Aldrick Derrig: Treating Shaylie Eklund/Extender: [name] Weeks in Treatment: 8 Compression Therapy Performed for Wound Assessment: Wound #7 Right,Lateral Foot Performed By: Clinician [name], RN Compression Type: Four Layer Post Procedure Diagnosis Same as Pre-procedure Electronic Signature(s) Signed: 12/22/2019 5:26:06 PM By: Kela Millin Entered By: Kela Millin on 12/22/2019 11:16:53 -------------------------------------------------------------------------------- Encounter Discharge Information Details Patient Name: Date of Service: Jeffery [name], Jeffery [name]. 12/22/2019 10:45 A M Medical Record Number: [id number] Patient Account Number: [id number] Date of Birth/Sex: Treating RN: October 08, 1960 (59 y.o. Jeffery [name] Primary Care Shacoya Burkhammer: [name] Other Clinician: Referring Topaz Raglin: Treating Annick Dimaio/Extender: [name] Weeks in Treatment: 8 Encounter Discharge Information Items Post Procedure Vitals Discharge Condition: Stable Temperature (F): 98.2 Ambulatory Status: Ambulatory Pulse (bpm): 93 Discharge Destination: Home Respiratory Rate (breaths/min): 18 Transportation: Private  Auto Blood Pressure (mmHg): 132/76 Accompanied By: alone Schedule Follow-up Appointment: Yes Clinical Summary of Care: Patient Declined Electronic Signature(s) Signed: 12/25/2019 5:31:38 PM By: [name] RN, BSN Entered By: [name] on 12/22/2019 11:38:19 -------------------------------------------------------------------------------- Lower Extremity Assessment Details Patient Name: Date of Service: Jeffery [name]. 12/22/2019 10:45 A M Medical Record Number: [id number] Patient Account Number: [id number] Date of Birth/Sex: Treating RN: Apr 15, 1960 (59 y.o. Oval Linsey Primary Care Clorene Nerio: [name] Other Clinician: Referring Sherry Rogus: Treating Shogo Larkey/Extender: Kandice Robinsons, Charlane Ferretti Weeks in Treatment: 8 Edema Assessment Assessed: [Left: No] [Right: No] Edema: [Left: Yes] [Right: Yes] Calf Left: Right: Point  of Measurement: 48 cm From Medial Instep 49 cm 27.5 cm Ankle Left: Right: Point of Measurement: 14 cm From Medial Instep 28 cm 22 cm Electronic Signature(s) Signed: 12/22/2019 4:51:09 PM By: Carlene Coria RN Entered By: Carlene Coria on 12/22/2019 10:50:48 -------------------------------------------------------------------------------- Multi Wound Chart Details Patient Name: Date of Service: Jeffery [name]. 12/22/2019 10:45 A M Medical Record Number: [id number] Patient Account Number: [id number] Date of Birth/Sex: Treating RN: Dec 20, 1960 (59 y.o. Jeffery [name] Primary Care Jeffery [name]: [name] Other Clinician: Referring [name]: Treating Jacobi Ryant/Extender: [name] Weeks in Treatment: 8 Vital Signs Height(in): 74 Pulse(bpm): 93 Weight(lbs): 345 Blood Pressure(mmHg): 132/76 Body Mass Index(BMI): 44 Temperature(F): 98.2 Respiratory Rate(breaths/min): 18 Photos: [13R:No Photos Right, Lateral Lower Leg] [15:No Photos Left, Posterior Lower Leg] [16:No Photos Right, Anterior Lower  Leg] Wound Location: [13R:Gradually Appeared] [15:Gradually Appeared] [16:Shear/Friction] Wounding Event: [13R:Venous Leg Ulcer] [15:Venous Leg Ulcer] [16:Skin [name] ear] Primary Etiology: [13R:Congestive Heart Failure,] [15:Congestive Heart Failure,] [16:Congestive Heart Failure,] Comorbid History: [13R:Hypertension, Type II Diabetes 11/17/2019] [15:Hypertension, Type II Diabetes 12/08/2019] [16:Hypertension, Type II Diabetes 12/15/2019] Date Acquired: [13R:4] [15:2] [16:1] Weeks of Treatment: [13R:Healed - Epithelialized] [15:Healed - Epithelialized] [16:Open] Wound Status: [13R:Yes] [15:No] [16:No] Wound Recurrence: [13R:Yes] [15:No] [16:No] Clustered Wound: [13R:3] [15:N/A] [16:N/A] Clustered Quantity: [13R:0x0x0] [15:0x0x0] [16:6x1x0.1] Measurements L x W x D (cm) [13R:0] [15:0] [16:4.712] A (cm) : rea [13R:0] [15:0] [16:0.471] Volume (cm) : [13R:100.00%] [15:100.00%] [16:68.40%] % Reduction in Area: [13R:100.00%] [15:100.00%] [16:68.40%] % Reduction in Volume: [13R:Full Thickness Without Exposed] [15:Full Thickness Without Exposed] [16:Full Thickness Without Exposed] Classification: [13R:Support Structures None Present] [15:Support Structures None Present] [16:Support Structures Medium] Exudate Amount: [13R:N/A] [15:N/A] [16:Serosanguineous] Exudate Type: [13R:N/A] [15:N/A] [16:red, Heney] Exudate Color: [13R:Flat and Intact] [15:Distinct, outline attached] [16:Flat and Intact] Wound Margin: [13R:None Present (0%)] [15:None Present (0%)] [16:Large (67-100%)] Granulation Amount: [13R:N/A] [15:N/A] [16:Pink] Granulation Quality: [13R:None Present (0%)] [15:None Present (0%)] [16:None Present (0%)] Necrotic Amount: [13R:Fascia: No] [15:Fascia: No] [16:Fat Layer (Subcutaneous Tissue): Yes] Exposed Structures: [13R:Fat Layer (Subcutaneous Tissue): No Tendon: No Muscle: No Joint: No Bone: No Large (67-100%)] [15:Fat Layer (Subcutaneous Tissue): No Tendon: No Muscle: No Joint: No Bone: No  Large (67-100%)] [16:Fascia: No Tendon: No Muscle: No Joint: No Bone: No  None] Epithelialization: [13R:N/A] [15:N/A] [16:N/A] Debridement: [13R:N/A] [15:N/A] [16:N/A] Pain Control: [13R:N/A] [15:N/A N/A] Tissue Debrided: [13R:N/A] [15:N/A N/A] Level: [13R:N/A] [15:N/A N/A] Debridement A (sq cm): [13R:rea N/A] [15:N/A N/A] Instrument: [13R:N/A] [15:N/A N/A] Bleeding: [13R:N/A] [15:N/A N/A] Hemostasis A chieved: [13R:N/A] [15:N/A N/A] Procedural Pain: [13R:N/A] [15:N/A N/A] Post Procedural Pain: [13R:N/A] [15:N/A N/A] Debridement Treatment Response: [13R:N/A] [15:N/A N/A] Post Debridement Measurements L x W x D (cm) [13R:N/A] [15:N/A N/A] Post Debridement Volume: (cm) [13R:N/A] [15:N/A Compression Therapy] Wound Number: 7 N/A N/A Photos: No Photos N/A N/A Right, Lateral Foot N/A N/A Wound Location: Gradually Appeared N/A N/A Wounding Event: Diabetic Wound/Ulcer of the Lower N/A N/A Primary Etiology: Extremity Congestive Heart Failure, N/A N/A Comorbid History: Hypertension, Type II Diabetes 08/02/2019 N/A N/A Date Acquired: 8 N/A N/A Weeks of Treatment: Open N/A N/A Wound Status: No N/A N/A Wound Recurrence: No N/A N/A Clustered Wound: N/A N/A N/A Clustered Quantity: 2.5x1.5x0.1 N/A N/A Measurements L x W x D (cm) 2.945 N/A N/A A (cm) : rea 0.295 N/A N/A Volume (cm) : 72.20% N/A N/A % Reduction in A rea: 96.00% N/A N/A % Reduction in Volume: Grade 2 N/A N/A Classification: Medium N/A N/A Exudate A mount: Serosanguineous N/A N/A Exudate Type: red, [place] N/A N/A Exudate Color: Flat  and Intact N/A N/A Wound Margin: Small (1-33%) N/A N/A Granulation A mount: Red N/A N/A Granulation Quality: Large (67-100%) N/A N/A Necrotic A mount: Fat Layer (Subcutaneous Tissue): Yes N/A N/A Exposed Structures: Fascia: No Tendon: No Muscle: No Joint: No Bone: No Medium (34-66%) N/A N/A Epithelialization: Debridement - Excisional N/A  N/A Debridement: Pre-procedure Verification/Time Out 11:05 N/A N/A Taken: Other N/A N/A Pain Control: Subcutaneous N/A N/A Tissue Debrided: Skin/Subcutaneous Tissue N/A N/A Level: 3.75 N/A N/A Debridement A (sq cm): rea Curette N/A N/A Instrument: Minimum N/A N/A Bleeding: Pressure N/A N/A Hemostasis A chieved: 0 N/A N/A Procedural Pain: 0 N/A N/A Post Procedural Pain: Procedure was tolerated well N/A N/A Debridement Treatment Response: 2.5x1.5x0.1 N/A N/A Post Debridement Measurements L x W x D (cm) 0.295 N/A N/A Post Debridement Volume: (cm) Compression Therapy N/A N/A Procedures Performed: Debridement Treatment Notes Wound #16 (Right, Anterior Lower Leg) 1. Cleanse With Soap and water 2. Periwound Care Moisturizing lotion 3. Primary Dressing Applied Calcium Alginate Ag 4. Secondary Dressing ABD Pad Dry Gauze 6. Support Layer Applied 4 layer compression wrap Wound #7 (Right, Lateral Foot) 1. Cleanse With Soap and water 2. Periwound Care Moisturizing lotion 3. Primary Dressing Applied Hydrofera Blue 4. Secondary Dressing ABD Pad Dry Gauze 6. Support Layer Applied 4 layer compression wrap Electronic Signature(s) Signed: 12/22/2019 5:26:06 PM By: Kela Millin Signed: 12/24/2019 6:30:39 AM By: Linton Ham MD Entered By: Linton Ham on 12/22/2019 11:56:01 -------------------------------------------------------------------------------- Multi-Disciplinary Care Plan Details Patient Name: Date of Service: Jeffery [name], [place] [name]. 12/22/2019 10:45 A M Medical Record Number: [id number] Patient Account Number: [id number] Date of Birth/Sex: Treating RN: 17-Mar-1961 (59 y.o. Jeffery [name] Primary Care Tamer Baughman: [name] Other Clinician: Referring Kanani Mowbray: Treating Evelyne Makepeace/Extender: [name] Weeks in Treatment: 8 Active Inactive Abuse / Safety / Falls / Self Care Management Nursing Diagnoses: Potential  for falls Potential for injury related to falls Goals: Patient will remain injury free related to falls Date Initiated: 10/23/2019 Target Resolution Date: 01/05/2020 Goal Status: Active Patient/caregiver will verbalize/demonstrate measures taken to prevent injury and/or falls Date Initiated: 10/23/2019 Target Resolution Date: 01/05/2020 Goal Status: Active Interventions: Assess Activities of Daily Living upon admission and as needed Assess fall risk on admission and as needed Assess: immobility, friction, shearing, incontinence upon admission and as needed Assess impairment of mobility on admission and as needed per policy Assess personal safety and home safety (as indicated) on admission and as needed Assess self care needs on admission and as needed Provide education on fall prevention Provide education on personal and home safety Notes: Nutrition Nursing Diagnoses: Impaired glucose control: actual or potential Potential for alteratiion in Nutrition/Potential for imbalanced nutrition Goals: Patient/caregiver agrees to and verbalizes understanding of need to use nutritional supplements and/or vitamins as prescribed Date Initiated: 10/23/2019 Target Resolution Date: 12/15/2019 Goal Status: Active Patient/caregiver will maintain therapeutic glucose control Date Initiated: 10/23/2019 Target Resolution Date: 12/15/2019 Goal Status: Active Interventions: Assess HgA1c results as ordered upon admission and as needed Assess patient nutrition upon admission and as needed per policy Provide education on elevated blood sugars and impact on wound healing Provide education on nutrition Treatment Activities: Education provided on Nutrition : 12/01/2019 Notes: Venous Leg Ulcer Nursing Diagnoses: Knowledge deficit related to disease process and management Potential for venous Insuffiency (use before diagnosis confirmed) Goals: Patient will maintain optimal edema control Date Initiated:  10/23/2019 Target Resolution Date: 01/05/2020 Goal Status: Active Patient/caregiver will verbalize understanding of disease process and disease management Date Initiated: 10/23/2019 Target  Resolution Date: 01/05/2020 Goal Status: Active Interventions: Assess peripheral edema status every visit. Compression as ordered Provide education on venous insufficiency Notes: Wound/Skin Impairment Nursing Diagnoses: Impaired tissue integrity Knowledge deficit related to ulceration/compromised skin integrity Goals: Patient/caregiver will verbalize understanding of skin care regimen Date Initiated: 10/23/2019 Target Resolution Date: 01/05/2020 Goal Status: Active Interventions: Assess patient/caregiver ability to obtain necessary supplies Assess patient/caregiver ability to perform ulcer/skin care regimen upon admission and as needed Assess ulceration(s) every visit Provide education on ulcer and skin care Notes: Electronic Signature(s) Signed: 12/22/2019 5:26:06 PM By: Kela Millin Entered By: Kela Millin on 12/22/2019 10:40:37 -------------------------------------------------------------------------------- Pain Assessment Details Patient Name: Date of Service: Jeffery [name]. 12/22/2019 10:45 A M Medical Record Number: [id number] Patient Account Number: [id number] Date of Birth/Sex: Treating RN: 07-Mar-1961 (58 y.o. Oval Linsey Primary Care Ziere Docken: [name] Other Clinician: Referring Charm Stenner: Treating Deseree Zemaitis/Extender: [name] Weeks in Treatment: 8 Active Problems Location of Pain Severity and Description of Pain Patient Has Paino No Site Locations Pain Management and Medication Current Pain Management: Electronic Signature(s) Signed: 12/22/2019 4:51:09 PM By: Carlene Coria RN Entered By: Carlene Coria on 12/22/2019 10:50:43 -------------------------------------------------------------------------------- Patient/Caregiver  Education Details Patient Name: Date of Service: Jeffery [name]. 9/24/2021andnbsp10:45 A M Medical Record Number: [id number] Patient Account Number: [id number] Date of Birth/Gender: Treating RN: 1960-11-15 (59 y.o. Jeffery [name] Primary Care Physician: [name] Other Clinician: Referring Physician: Treating Physician/Extender: [name] in Treatment: 8 Education Assessment Education Provided To: Patient Education Topics Provided Wound/Skin Impairment: Handouts: Caring for Your Ulcer Methods: Explain/Verbal Responses: State content correctly Electronic Signature(s) Signed: 12/22/2019 5:26:06 PM By: Kela Millin Entered By: Kela Millin on 12/22/2019 10:40:51 -------------------------------------------------------------------------------- Wound Assessment Details Patient Name: Date of Service: Jeffery [name]. 12/22/2019 10:45 A M Medical Record Number: [id number] Patient Account Number: [id number] Date of Birth/Sex: Treating RN: [birth year] (58 y.o. Jeffery [name]) Carlene Coria Primary Care Jhovani Griswold: [name] Other Clinician: Referring Nolton Denis: Treating Bentlee Benningfield/Extender: [name] Weeks in Treatment: 8 Wound Status Wound Number: [id number] Primary Etiology: Venous Leg Ulcer Wound Location: Right, Lateral Lower Leg Wound Status: Healed - Epithelialized Wounding Event: Gradually Appeared Comorbid History: Congestive Heart Failure, Hypertension, Type II Diabetes Date Acquired: 11/17/2019 Weeks Of Treatment: 4 Clustered Wound: Yes Wound Measurements Length: (cm) Width: (cm) Depth: (cm) Clustered Quantity: Area: (cm) Volume: (cm) 0 % Reduction in Area: 100% 0 % Reduction in Volume: 100% 0 Epithelialization: Large (67-100%) 3 Tunneling: No 0 Undermining: No 0 Wound Description Classification: Full Thickness Without Exposed Support Structures Wound Margin: Flat and Intact Exudate Amount:  None Present Foul Odor After Cleansing: No Slough/Fibrino No Wound Bed Granulation Amount: None Present (0%) Exposed Structure Necrotic Amount: None Present (0%) Fascia Exposed: No Fat Layer (Subcutaneous Tissue) Exposed: No Tendon Exposed: No Muscle Exposed: No Joint Exposed: No Bone Exposed: No Electronic Signature(s) Signed: 12/22/2019 4:51:09 PM By: Carlene Coria RN Signed: 12/22/2019 5:26:06 PM By: Kela Millin Entered By: Kela Millin on 12/22/2019 11:02:54 -------------------------------------------------------------------------------- Wound Assessment Details Patient Name: Date of Service: Jeffery [name]. 12/22/2019 10:45 A M Medical Record Number: [id number] Patient Account Number: [id number] Date of Birth/Sex: Treating RN: 1960/04/16 (58 y.o. Oval Linsey Primary Care Burgess Sheriff: [name] Other Clinician: Referring Harpreet Pompey: Treating Aylanie Cubillos/Extender: [name] Weeks in Treatment: 8 Wound Status Wound Number: 15 Primary Etiology: Venous Leg Ulcer Wound Location: Left, Posterior Lower Leg Wound Status: Healed - Epithelialized Wounding Event: Gradually Appeared Comorbid History: Congestive  Heart Failure, Hypertension, Type II Diabetes Date Acquired: 12/08/2019 Weeks Of Treatment: 2 Clustered Wound: No Wound Measurements Length: (cm) Width: (cm) Depth: (cm) Area: (cm) Volume: (cm) 0 % Reduction in Area: 100% 0 % Reduction in Volume: 100% 0 Epithelialization: Large (67-100%) 0 Tunneling: No 0 Undermining: No Wound Description Classification: Full Thickness Without Exposed Support Structures Wound Margin: Distinct, outline attached Exudate Amount: None Present Foul Odor After Cleansing: No Slough/Fibrino No Wound Bed Granulation Amount: None Present (0%) Exposed Structure Necrotic Amount: None Present (0%) Fascia Exposed: No Fat Layer (Subcutaneous Tissue) Exposed: No Tendon Exposed: No Muscle Exposed:  No Joint Exposed: No Bone Exposed: No Electronic Signature(s) Signed: 12/22/2019 4:51:09 PM By: Carlene Coria RN Signed: 12/22/2019 5:26:06 PM By: Kela Millin Entered By: Kela Millin on 12/22/2019 11:07:47 -------------------------------------------------------------------------------- Wound Assessment Details Patient Name: Date of Service: Jeffery [name]. 12/22/2019 10:45 A M Medical Record Number: [id number] Patient Account Number: [id number] Date of Birth/Sex: Treating RN: 07-21-1960 (58 y.o. Jeffery [name]) Carlene Coria Primary Care Aking Klabunde: [name] Other Clinician: Referring Niyanna Asch: Treating Cristy Colmenares/Extender: [name] Weeks in Treatment: 8 Wound Status Wound Number: 16 Primary Etiology: Skin Tear Wound Location: Right, Anterior Lower Leg Wound Status: Open Wounding Event: Shear/Friction Comorbid History: Congestive Heart Failure, Hypertension, Type II Diabetes Date Acquired: 12/15/2019 Weeks Of Treatment: 1 Clustered Wound: No Wound Measurements Length: (cm) 6 Width: (cm) 1 Depth: (cm) 0.1 Area: (cm) 4.712 Volume: (cm) 0.471 % Reduction in Area: 68.4% % Reduction in Volume: 68.4% Epithelialization: None Tunneling: No Undermining: No Wound Description Classification: Full Thickness Without Exposed Support Structures Wound Margin: Flat and Intact Exudate Amount: Medium Exudate Type: Serosanguineous Exudate Color: red, [place] Foul Odor After Cleansing: No Slough/Fibrino No Wound Bed Granulation Amount: Large (67-100%) Exposed Structure Granulation Quality: Pink Fascia Exposed: No Necrotic Amount: None Present (0%) Fat Layer (Subcutaneous Tissue) Exposed: Yes Tendon Exposed: No Muscle Exposed: No Joint Exposed: No Bone Exposed: No Treatment Notes Wound #16 (Right, Anterior Lower Leg) 1. Cleanse With Soap and water 2. Periwound Care Moisturizing lotion 3. Primary Dressing Applied Calcium Alginate Ag 4. Secondary  Dressing ABD Pad Dry Gauze 6. Support Layer Applied 4 layer compression Water quality scientist) Signed: 12/22/2019 4:51:09 PM By: Carlene Coria RN Entered By: Carlene Coria on 12/22/2019 10:52:32 -------------------------------------------------------------------------------- Wound Assessment Details Patient Name: Date of Service: Jeffery [name]. 12/22/2019 10:45 A M Medical Record Number: [id number] Patient Account Number: [id number] Date of Birth/Sex: Treating RN: [birth year] (58 y.o. Jeffery [name]) Carlene Coria Primary Care Brailey Buescher: [name] Other Clinician: Referring Glendia Olshefski: Treating Kenna Kirn/Extender: [name] Weeks in Treatment: 8 Wound Status Wound Number: 7 Primary Etiology: Diabetic Wound/Ulcer of the Lower Extremity Wound Location: Right, Lateral Foot Wound Status: Open Wounding Event: Gradually Appeared Comorbid History: Congestive Heart Failure, Hypertension, Type II Diabetes Date Acquired: 08/02/2019 Weeks Of Treatment: 8 Clustered Wound: No Wound Measurements Length: (cm) 2.5 Width: (cm) 1.5 Depth: (cm) 0.1 Area: (cm) 2.945 Volume: (cm) 0.295 % Reduction in Area: 72.2% % Reduction in Volume: 96% Epithelialization: Medium (34-66%) Tunneling: No Undermining: No Wound Description Classification: Grade 2 Wound Margin: Flat and Intact Exudate Amount: Medium Exudate Type: Serosanguineous Exudate Color: red, Simar Foul Odor After Cleansing: No Slough/Fibrino Yes Wound Bed Granulation Amount: Small (1-33%) Exposed Structure Granulation Quality: Red Fascia Exposed: No Necrotic Amount: Large (67-100%) Fat Layer (Subcutaneous Tissue) Exposed: Yes Necrotic Quality: Adherent Slough Tendon Exposed: No Muscle Exposed: No Joint Exposed: No Bone Exposed: No Treatment Notes Wound #7 (Right, Lateral Foot) 1.  Cleanse With Soap and water 2. Periwound Care Moisturizing lotion 3. Primary Dressing Applied Hydrofera Blue 4.  Secondary Dressing ABD Pad Dry Gauze 6. Support Layer Applied 4 layer compression wrap Electronic Signature(s) Signed: 12/22/2019 4:51:09 PM By: Carlene Coria RN Entered By: Carlene Coria on 12/22/2019 10:52:47 -------------------------------------------------------------------------------- [place] Details Patient Name: Date of Service: Jeffery [name], [place] [name]. 12/22/2019 10:45 A M Medical Record Number: [id number] Patient Account Number: [id number] Date of Birth/Sex: Treating RN: [birth year] (58 y.o. Jeffery [name]) Carlene Coria Primary Care Rondia Higginbotham: [name] Other Clinician: Referring Quadre Bristol: Treating Reaghan Kawa/Extender: [name] Weeks in Treatment: 8 Vital Signs Time Taken: 10:50 Temperature (F): 98.2 Height (in): 74 Pulse (bpm): 93 Weight (lbs): 345 Respiratory Rate (breaths/min): 18 Body Mass Index (BMI): 44.3 Blood Pressure (mmHg): 132/76 Reference Range: 80 - 120 mg / dl Electronic Signature(s) Signed: 12/22/2019 4:51:09 PM By: Carlene Coria RN Entered By: Carlene Coria on 12/22/2019 10:50:36

## 2019-12-25 NOTE — Telephone Encounter (Signed)
Patient was called and patient states that he got his refill from his heart doctor.

## 2019-12-26 DIAGNOSIS — M25552 Pain in left hip: Secondary | ICD-10-CM | POA: Diagnosis not present

## 2019-12-26 DIAGNOSIS — M5412 Radiculopathy, cervical region: Secondary | ICD-10-CM | POA: Diagnosis not present

## 2019-12-26 DIAGNOSIS — E1143 Type 2 diabetes mellitus with diabetic autonomic (poly)neuropathy: Secondary | ICD-10-CM | POA: Diagnosis not present

## 2019-12-26 DIAGNOSIS — M25512 Pain in left shoulder: Secondary | ICD-10-CM | POA: Diagnosis not present

## 2019-12-26 DIAGNOSIS — Z6841 Body Mass Index (BMI) 40.0 and over, adult: Secondary | ICD-10-CM | POA: Diagnosis not present

## 2019-12-26 DIAGNOSIS — M25511 Pain in right shoulder: Secondary | ICD-10-CM | POA: Diagnosis not present

## 2019-12-26 DIAGNOSIS — M545 Low back pain: Secondary | ICD-10-CM | POA: Diagnosis not present

## 2019-12-26 DIAGNOSIS — M542 Cervicalgia: Secondary | ICD-10-CM | POA: Diagnosis not present

## 2019-12-26 DIAGNOSIS — G890 Central pain syndrome: Secondary | ICD-10-CM | POA: Diagnosis not present

## 2019-12-26 DIAGNOSIS — M25571 Pain in right ankle and joints of right foot: Secondary | ICD-10-CM | POA: Diagnosis not present

## 2019-12-28 ENCOUNTER — Encounter: Payer: Self-pay | Admitting: Podiatry

## 2019-12-28 NOTE — Progress Notes (Signed)
Subjective:  Patient ID: Jeffery [name], male    DOB: 1961/03/03,  MRN: [id number]  59 y.o. male presents with at risk foot care. 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 Pain interferes with ambulation. Aggravating factors include wearing enclosed shoe gear. Pain is relieved with periodic professional debridement..    Patient's blood sugar was 120 mg/dl this morning.  He still has a small wound on dorsolateral aspect of his right foot and is seeing Dr. Jacqualyn Posey and Outpatient Wound Care. He denies any redness, drainage or swelling. Denies any fever, chills, night sweats, nausea or vomiting.  PCP: [name], MD and last visit was: 10/11/2019.  Review of Systems: Negative except as noted in the HPI.  Past Medical History:  Diagnosis Date   Atrial fibrillation ([place])    Cellulitis and abscess of foot 07/2019   RIGHT FOOT   Charcot's joint of right foot    DDD (degenerative disc disease), lumbar    Diabetes mellitus without complication (HCC)    Fatty liver    Hypertension    Obesity    Rotator cuff disorder    Shoulder impingement, right    Spinal stenosis at L4-L5 level    Past Surgical History:  Procedure Laterality Date   AMPUTATION Left 10/02/2014   Procedure: Left Third toe amputation ;  Surgeon: [name], MD;  Location: [facility];  Service: Orthopedics;  Laterality: Left;  Regular bed, wants to follow hip   ANTERIOR CRUCIATE LIGAMENT REPAIR Right 90   reconstruction   APPLICATION OF WOUND VAC Left 10/02/2014   Procedure: APPLICATION OF WOUND VAC; toe Surgeon: [name], MD;  Location: [facility];  Service: Orthopedics;  Laterality: Left;   CARDIOVERSION N/A 04/26/2019   Procedure: CARDIOVERSION;  Surgeon: Pixie Casino, MD;  Location: [facility];  Service: Cardiovascular;  Laterality: N/A;   CARDIOVERSION N/A 05/18/2019   Procedure: CARDIOVERSION (CATH LAB);  Surgeon: Constance Haw, MD;  Location: [facility];  Service: Cardiovascular;  Laterality: N/A;   I & D EXTREMITY Left 10/05/2014   Procedure: IRRIGATION AND DEBRIDEMENT LEFT FOOT;  Surgeon: [name], MD;  Location: [facility];  Service: Orthopedics;  Laterality: Left;   INCISION AND DRAINAGE Right 08/09/2019   Procedure: INCISION AND DRAINAGE RIGHT FOOT;  Surgeon: Trula Slade, DPM;  Location: [facility];  Service: Podiatry;  Laterality: Right;   KNEE ARTHROSCOPY W/ ACL RECONSTRUCTION Right    TOTAL KNEE ARTHROPLASTY Right 03/28/2015   TOTAL KNEE ARTHROPLASTY Right 03/28/2015   Procedure: RIGHT TOTAL KNEE ARTHROPLASTY;  Surgeon: [name], MD;  Location: [facility];  Service: Orthopedics;  Laterality: Right;   WOUND DEBRIDEMENT Right 08/14/2019   Procedure: DEBRIDEMENT WOUND;  Surgeon: Trula Slade, DPM;  Location: [facility];  Service: Podiatry;  Laterality: Right;   Patient Active Problem List   Diagnosis Date Noted   Hypothyroidism 10/11/2019   Venous stasis    Acute pain of left knee    Left leg cellulitis 09/07/2019   Sepsis ([place]) 09/07/2019   Right foot ulcer, with fat layer exposed ([place]) 08/08/2019   Cellulitis of right lower extremity 08/08/2019   Mixed diabetic hyperlipidemia associated with type 2 diabetes mellitus ([place]) 08/08/2019   Severe sepsis with acute organ dysfunction due to methicillin susceptible Staphylococcus aureus (MSSA) ([place]) 08/08/2019   AKI (acute kidney injury) ([place]) 08/08/2019   Sepsis due to methicillin susceptible Staphylococcus aureus (MSSA)  with acute renal failure (HCC) 08/08/2019   Persistent atrial fibrillation (HCC)    Testosterone deficiency 03/16/2018   Chronic left shoulder pain 03/01/2018   Body mass index 40.0-44.9, adult ([place]) 03/01/2018   DDD (degenerative disc disease), lumbar 12/15/2017   Chronic anticoagulation 10/26/2017   Dyspnea 10/26/2017   S/P right rotator cuff repair 04/05/2017   Achilles tendon contracture,  right 01/05/2017   Closed nondisplaced fracture of distal phalanx of right great toe 01/05/2017   Paroxysmal atrial fibrillation ([place]) 10/30/2016   Pain in right ankle and joints of right foot 07/09/2016   Uncontrolled type 2 diabetes mellitus with diabetic polyneuropathy, without long-term current use of insulin ([name]) 07/09/2016   Idiopathic chronic venous hypertension of right lower extremity with inflammation 07/09/2016   Impingement syndrome of right shoulder 07/07/2016   Morbid obesity ([place]) 06/05/2016   S/P TKR (total knee replacement), right 03/28/2015   Knee osteoarthritis 11/12/2014   Essential hypertension 10/15/2014   Cellulitis of left lower extremity    Charcot foot due to diabetes mellitus ([place]) 10/01/2014   Accelerated hypertension 10/01/2014   Gangrene left third toe 10/01/2014    Current Outpatient Medications:    Accu-Chek FastClix Lancets MISC, Use as directed to check blood sugar at least twice daily. E11.8 E11.65 Z79.4 (Patient taking differently: 1 each by Other route See admin instructions. Use as directed to check blood sugar at least twice daily. E11.8 E11.65 Z79.4), Disp: 102 each, Rfl: 11   ACCU-CHEK GUIDE test strip, USE AS DIRECTED AT LEAST TWO TIMES A DAY, Disp: 100 strip, Rfl: 11   allopurinol (ZYLOPRIM) 100 MG tablet, Take 0.5 tablets (50 mg total) by mouth daily., Disp: 15 tablet, Rfl: 6   amiodarone (PACERONE) 200 MG tablet, Take 200 mg by mouth 2 (two) times daily. , Disp: , Rfl:    amLODipine (NORVASC) 10 MG tablet, Take 1 tablet (10 mg total) by mouth daily., Disp: 30 tablet, Rfl: 1   apixaban (ELIQUIS) 2.5 MG TABS tablet, Take 1 tablet (2.5 mg total) by mouth 2 (two) times daily., Disp: 60 tablet, Rfl:    atorvastatin (LIPITOR) 20 MG tablet, Take 1 tablet (20 mg total) by mouth daily., Disp: 90 tablet, Rfl: 1   collagenase (SANTYL) ointment, Apply 1 application topically daily., Disp: 15 g, Rfl: 0   furosemide (LASIX) 80 MG  tablet, Take 1 tablet (80 mg total) by mouth 2 (two) times daily., Disp: 60 tablet, Rfl: 1   gabapentin (NEURONTIN) 300 MG capsule, TAKE ONE CAPSULE BY MOUTH TWICE A DAY, Disp: 180 capsule, Rfl: 0   hydrALAZINE (APRESOLINE) 50 MG tablet, Take 1 tablet (50 mg total) by mouth every 8 (eight) hours., Disp: 90 tablet, Rfl: 1   insulin glargine (LANTUS SOLOSTAR) 100 UNIT/ML Solostar Pen, Inject 40 Units into the skin 2 (two) times daily., Disp: 30 mL, Rfl: 6   insulin lispro (INSULIN LISPRO) 100 UNIT/ML KwikPen Junior, Inject 0.08 mLs (8 Units total) into the skin 3 (three) times daily. (Patient taking differently: Inject 8 Units into the skin 3 (three) times daily as needed (Low blood sugar). ), Disp: 30 mL, Rfl: 6   Insulin Pen Needle (B-D ULTRAFINE III SHORT PEN) 31G X 8 MM MISC, 1 each by Does not apply route 3 (three) times daily., Disp: 100 each, Rfl: 5   Insulin Syringe-Needle U-100 (TRUEPLUS INSULIN SYRINGE) 30G X 5/16" 0.5 ML MISC, Use as directed 3 times daily (Patient taking differently: 1 each by Other route See admin instructions. Use as directed  3 times daily), Disp: 100 each, Rfl: 5   levothyroxine (SYNTHROID) 75 MCG tablet, Take 1 tablet (75 mcg total) by mouth daily., Disp: 90 tablet, Rfl: 3   liraglutide (VICTOZA) 18 MG/3ML SOPN, Inject 0.3 mLs (1.8 mg total) into the skin daily., Disp: 30 mL, Rfl: 6   Misc. Devices MISC, Extra-large bedside commode. Dx left leg cellulitis, Disp: 1 each, Rfl: 0   oxyCODONE-acetaminophen (PERCOCET) 10-325 MG tablet, Take 1 tablet by mouth every 4 (four) hours as needed for pain. , Disp: , Rfl:    polyethylene glycol (MIRALAX / GLYCOLAX) 17 g packet, Take 17 g by mouth daily as needed for mild constipation., Disp: 14 each, Rfl: 0   predniSONE (DELTASONE) 20 MG tablet, Take 1 tablet (20 mg total) by mouth 2 (two) times daily with a meal., Disp: 10 tablet, Rfl: 0   Sennosides (EX-LAX) 15 MG TABS, Take 15 mg by mouth daily as needed (Constipation).,  Disp: , Rfl:    sodium chloride (OCEAN) 0.65 % SOLN nasal spray, Place 1 spray into both nostrils daily as needed for congestion. , Disp: , Rfl:    TRUEPLUS INSULIN SYRINGE 30G X 5/16" 0.5 ML MISC, USE AS DIRECTED 3 TIMES DAILY (Patient taking differently: 1 each by Other route in the morning, at noon, and at bedtime. ), Disp: 100 each, Rfl: 0 Allergies  Allergen Reactions   Metformin And Related Other (See Comments)    GI upset   Social History   Tobacco Use  Smoking Status Former Smoker   Packs/day: 0.00   Years: 38.00   Pack years: 0.00   Types: Cigarettes  Smokeless Tobacco Never Used  Tobacco Comment   QUIT 10/2016   Objective:  There were no vitals filed for this visit. Constitutional Patient is a pleasant 59 y.o. Caucasian male morbidly obese in NAD.Marland Kitchen AAO x 3.  Vascular Capillary fill time to digits <3 seconds b/l lower extremities. Palpable pedal pulses b/l LE. Pedal hair absent. Lower extremity skin temperature gradient within normal limits. No pain with calf compression b/l. Nonpitting edema noted b/l lower extremities. No cyanosis or clubbing noted.  Neurologic Normal speech. Oriented to person, place, and time. Protective sensation decreased with 10 gram monofilament b/l.  Dermatologic Pedal skin with normal turgor, texture and tone bilaterally. No open wounds bilaterally. No interdigital macerations bilaterally. Toenails 1-5 right, L hallux, L 2nd toe, L 4th toe and L 5th toe elongated, discolored, dystrophic, thickened, and crumbly with subungual debris and tenderness to dorsal palpation.  Orthopedic: Normal muscle strength 5/5 to all lower extremity muscle groups bilaterally.   Hemoglobin A1C Latest Ref Rng & Units 09/07/2019 08/08/2019 06/20/2019 03/28/2019  HGBA1C 4.8 - 5.6 % 8.0(H) 7.1(H) 7.1(A) 7.6(A)  Some recent data might be hidden   Assessment:   1. Pain due to onychomycosis of toenails of both feet   2. Diabetic Charcot foot ([place])   3. Status post  amputation of lesser toe, left ([place])   4. Diabetic peripheral neuropathy associated with type 2 diabetes mellitus ([place])    Plan:  Patient was evaluated and treated and all questions answered.  Onychomycosis with pain -Nails palliatively debridement as below. -Educated on self-care  Procedure: Nail Debridement Rationale: Pain Type of Debridement: manual, sharp debridement. Instrumentation: Nail nipper, rotary burr. Number of Nails: 9  -Examined patient. -Continue diabetic foot care principles. -Toenails 1-5 right, L hallux, L 2nd toe, L 4th toe and L 5th toe debrided in length and girth without iatrogenic bleeding with sterile  nail nipper and dremel.  -Patient to report any pedal injuries to medical professional immediately. -Patient to continue soft, supportive shoe gear daily. -Patient/POA to call should there be question/concern in the interim.  Return in about 3 months (around 03/25/2020).  Marzetta Board, DPM

## 2019-12-29 ENCOUNTER — Encounter (HOSPITAL_BASED_OUTPATIENT_CLINIC_OR_DEPARTMENT_OTHER): Payer: Medicaid Other | Admitting: Internal Medicine

## 2020-01-04 ENCOUNTER — Other Ambulatory Visit (HOSPITAL_COMMUNITY): Payer: Self-pay | Admitting: *Deleted

## 2020-01-05 ENCOUNTER — Encounter (HOSPITAL_COMMUNITY)
Admission: RE | Admit: 2020-01-05 | Discharge: 2020-01-05 | Disposition: A | Discharge status: Home / Self Care | Payer: Medicaid Other | Source: Ambulatory Visit | Attending: Nephrology | Admitting: Nephrology

## 2020-01-05 ENCOUNTER — Encounter (HOSPITAL_BASED_OUTPATIENT_CLINIC_OR_DEPARTMENT_OTHER): Payer: Medicaid Other | Attending: Internal Medicine | Admitting: Internal Medicine

## 2020-01-05 ENCOUNTER — Other Ambulatory Visit: Payer: Self-pay

## 2020-01-05 DIAGNOSIS — L97812 Non-pressure chronic ulcer of other part of right lower leg with fat layer exposed: Secondary | ICD-10-CM | POA: Diagnosis not present

## 2020-01-05 DIAGNOSIS — D631 Anemia in chronic kidney disease: Secondary | ICD-10-CM | POA: Diagnosis present

## 2020-01-05 DIAGNOSIS — T8131XA Disruption of external operation (surgical) wound, not elsewhere classified, initial encounter: Secondary | ICD-10-CM | POA: Insufficient documentation

## 2020-01-05 DIAGNOSIS — L97512 Non-pressure chronic ulcer of other part of right foot with fat layer exposed: Secondary | ICD-10-CM | POA: Diagnosis not present

## 2020-01-05 DIAGNOSIS — Y838 Other surgical procedures as the cause of abnormal reaction of the patient, or of later complication, without mention of misadventure at the time of the procedure: Secondary | ICD-10-CM | POA: Insufficient documentation

## 2020-01-05 DIAGNOSIS — L97821 Non-pressure chronic ulcer of other part of left lower leg limited to breakdown of skin: Secondary | ICD-10-CM | POA: Diagnosis not present

## 2020-01-05 DIAGNOSIS — E11621 Type 2 diabetes mellitus with foot ulcer: Secondary | ICD-10-CM | POA: Insufficient documentation

## 2020-01-05 DIAGNOSIS — N183 Chronic kidney disease, stage 3 unspecified: Secondary | ICD-10-CM | POA: Insufficient documentation

## 2020-01-05 DIAGNOSIS — S90812D Abrasion, left foot, subsequent encounter: Secondary | ICD-10-CM | POA: Insufficient documentation

## 2020-01-05 DIAGNOSIS — L97811 Non-pressure chronic ulcer of other part of right lower leg limited to breakdown of skin: Secondary | ICD-10-CM | POA: Insufficient documentation

## 2020-01-05 LAB — IRON AND TIBC
Iron: 67 ug/dL (ref 45–182)
Saturation Ratios: 21 % (ref 17.9–39.5)
TIBC: 319 ug/dL (ref 250–450)
UIBC: 252 ug/dL

## 2020-01-05 LAB — RENAL FUNCTION PANEL
Albumin: 4.1 g/dL (ref 3.5–5.0)
Anion gap: 11 (ref 5–15)
BUN: 45 mg/dL — ABNORMAL HIGH (ref 6–20)
CO2: 23 mmol/L (ref 22–32)
Calcium: 9.6 mg/dL (ref 8.9–10.3)
Chloride: 105 mmol/L (ref 98–111)
Creatinine, Ser: 2.67 mg/dL — ABNORMAL HIGH (ref 0.61–1.24)
GFR calc non Af Amer: 25 mL/min — ABNORMAL LOW (ref >60)
Glucose, Bld: 103 mg/dL — ABNORMAL HIGH (ref 70–99)
Phosphorus: 4.6 mg/dL (ref 2.5–4.6)
Potassium: 4.1 mmol/L (ref 3.5–5.1)
Sodium: 139 mmol/L (ref 135–145)

## 2020-01-05 LAB — FERRITIN: Ferritin: 428 ng/mL — ABNORMAL HIGH (ref 24–336)

## 2020-01-05 LAB — POCT HEMOGLOBIN-HEMACUE: Hemoglobin: 11.6 g/dL — ABNORMAL LOW (ref 13.0–17.0)

## 2020-01-05 MED ORDER — EPOETIN ALFA-EPBX 10000 UNIT/ML IJ SOLN
INTRAMUSCULAR | Status: AC
  Filled 2020-01-05: qty 2

## 2020-01-05 MED ORDER — EPOETIN ALFA-EPBX 10000 UNIT/ML IJ SOLN
20000.0000 [IU] | Freq: Once | INTRAMUSCULAR | Status: AC
  Administered 2020-01-05: 20000 [IU] via SUBCUTANEOUS

## 2020-01-06 LAB — PTH, INTACT AND CALCIUM
Calcium, Total (PTH): 9.4 mg/dL (ref 8.7–10.2)
PTH: 72 pg/mL — ABNORMAL HIGH (ref 15–65)

## 2020-01-08 NOTE — Progress Notes (Signed)
Jeffery, KINNEY [name] ([id number]) . Visit Report for 01/05/2020 Arrival Information Details Patient Name: Date of Service: Jeffery [name]. 01/05/2020 10:30 A M Medical Record Number: [id number] Patient Account Number: [id number] Date of Birth/Sex: Treating [name]: 04-27-1960 (58 y.o. Jeffery [name] Primary Care Jeffery [name]: Jeffery [name] Other Clinician: Referring Jeffery [name]: Treating Jeffery [name]/Extender: Jeffery [name]: 10 Visit Information History Since Last Visit Added or deleted any medications: No Patient Arrived: Ambulatory Any new allergies or adverse reactions: No Arrival Time: 10:56 Had a fall or experienced change in No Accompanied By: self activities of daily living that may affect Transfer Assistance: None risk of falls: Patient Identification Verified: Yes Signs or symptoms of abuse/neglect since last visito No Secondary Verification Process Completed: Yes Hospitalized since last visit: No Patient Requires Transmission-Based Precautions: No Implantable device outside of the clinic excluding No Patient Has Alerts: No cellular tissue based products placed in the center since last visit: Has Dressing in Place as Prescribed: Yes Pain Present Now: No Electronic Signature(s) Signed: 01/05/2020 11:39:24 AM By: Jeffery [name] Entered By: Jeffery [name] on 01/05/2020 10:56:48 -------------------------------------------------------------------------------- Compression Therapy Details Patient Name: Date of Service: Jeffery [name]. 01/05/2020 10:30 A M Medical Record Number: [id number] Patient Account Number: [id number] Date of Birth/Sex: Treating [name]: 1960/11/20 (59 y.o. Jeffery [name] Primary Care Zamari Vea: Jeffery [name] Other Clinician: Referring Jeffery [name]: Treating Jeffery [name]/Extender: Jeffery [name]: 10 Compression Therapy Performed for Wound Assessment: Wound #7 Right,Lateral  Foot Performed By: Clinician Jeffery [name] Compression Type: Four Layer Post Procedure Diagnosis Same as Pre-procedure Electronic Signature(s) Signed: 01/05/2020 5:35:06 PM By: Jeffery [name] Entered By: Jeffery [name] on 01/05/2020 11:57:58 -------------------------------------------------------------------------------- Encounter Discharge Information Details Patient Name: Date of Service: Jeffery Raring, [place] [name]. 01/05/2020 10:30 A M Medical Record Number: [id number] Patient Account Number: [id number] Date of Birth/Sex: Treating [name]: 05/13/1960 (59 y.o. Jeffery [name] Primary Care Jeffery [name]: Jeffery [name] Other Clinician: Referring Kendarius Vigen: Treating Jeffery [name]/Extender: Jeffery [name] in [name]: 10 Encounter Discharge Information Items Discharge Condition: Stable Ambulatory Status: Ambulatory Discharge Destination: Home Transportation: Private Auto Accompanied By: self Schedule Follow-up Appointment: Yes Clinical Summary of Care: Patient Declined Electronic Signature(s) Signed: 01/05/2020 5:42:53 PM By: Jeffery [name] Entered By: Jeffery Gouty on 01/05/2020 12:21:02 -------------------------------------------------------------------------------- Lower Extremity Assessment Details Patient Name: Date of Service: Jeffery [name]. 01/05/2020 10:30 A M Medical Record Number: [id number] Patient Account Number: [id number] Date of Birth/Sex: Treating [name]: 1960-10-13 (59 y.o. Jeffery [name] Primary Care Emmilia Sowder: Jeffery [name] Other Clinician: Referring Shakima Nisley: Treating Thao Vanover/Extender: Jeffery [name]: 10 Edema Assessment Assessed: [Left: No] [Right: No] Edema: [Left: Ye] [Right: s] Calf Left: Right: Point of Measurement: 48 cm From Medial Instep 41.5 cm Ankle Left: Right: Point of Measurement: 14 cm From Medial Instep 25 cm Vascular Assessment Pulses: Dorsalis  Pedis Palpable: [Right:Yes] Electronic Signature(s) Signed: 01/05/2020 5:42:53 PM By: Jeffery [name] Entered By: Jeffery Gouty on 01/05/2020 11:02:31 -------------------------------------------------------------------------------- Multi Wound Chart Details Patient Name: Date of Service: Jeffery [name]. 01/05/2020 10:30 A M Medical Record Number: [id number] Patient Account Number: [id number] Date of Birth/Sex: Treating [name]: Jul 17, 1960 (59 y.o. Jeffery [name] Primary Care Kelce Bouton: Jeffery [name] Other Clinician: Referring Kirat Mezquita: Treating [place] Langstaff/Extender: Jeffery [name]: 10 Vital Signs Height(in): 74 Pulse(bpm): 83 Weight(lbs): 345 Blood Pressure(mmHg): 150/86 Body Mass Index(BMI): 44 Temperature(F): 98.4 Respiratory Rate(breaths/min): 18 Photos: [16:No Photos Right, Anterior Lower Leg] [7:No  Photos Right, Lateral Foot] [N/A:N/A N/A] Wound Location: [16:Shear/Friction] [7:Gradually Appeared] [N/A:N/A] Wounding Event: [16:Skin Tear] [7:Diabetic Wound/Ulcer of the Lower] [N/A:N/A] Primary Etiology: [16:Congestive Heart Failure,] [7:Extremity Congestive Heart Failure,] [N/A:N/A] Comorbid History: [16:Hypertension, Type II Diabetes 12/15/2019] [7:Hypertension, Type II Diabetes 08/02/2019] [N/A:N/A] Date Acquired: [16:3] [7:10] [N/A:N/A] Weeks of [name]: [16:Open] [7:Open] [N/A:N/A] Wound Status: [16:0x0x0] [7:1.7x1.1x0.1] [N/A:N/A] Measurements L x W x D (cm) [16:0] [7:1.469] [N/A:N/A] A (cm) : rea [16:0] [7:0.147] [N/A:N/A] Volume (cm) : [16:100.00%] [7:86.10%] [N/A:N/A] % Reduction in Area: [16:100.00%] [7:98.00%] [N/A:N/A] % Reduction in Volume: [16:Full Thickness Without Exposed] [7:Grade 2] [N/A:N/A] Classification: [16:Support Structures None Present] [7:Medium] [N/A:N/A] Exudate Amount: [16:N/A] [7:Serosanguineous] [N/A:N/A] Exudate Type: [16:N/A] [7:red, Blakeney] [N/A:N/A] Exudate Color: [16:Flat and  Intact] [7:Flat and Intact] [N/A:N/A] Wound Margin: [16:None Present (0%)] [7:Medium (34-66%)] [N/A:N/A] Granulation Amount: [16:N/A] [7:Red] [N/A:N/A] Granulation Quality: [16:None Present (0%)] [7:Medium (34-66%)] [N/A:N/A] Necrotic Amount: [16:Fascia: No] [7:Fat Layer (Subcutaneous Tissue): Yes N/A] Exposed Structures: [16:Fat Layer (Subcutaneous Tissue): No Tendon: No Muscle: No Joint: No Bone: No Large (67-100%)] [7:Fascia: No Tendon: No Muscle: No Joint: No Bone: No Small (1-33%)] [N/A:N/A] Epithelialization: [16:N/A] [7:Compression Therapy] [N/A:N/A] [name] Notes Wound #7 (Right, Lateral Foot) 2. Periwound Care Moisturizing lotion TCA Cream 3. Primary Dressing Applied Hydrofera Blue 4. Secondary Dressing Dry Gauze 6. Support Layer Applied 4 layer compression wrap Electronic Signature(s) Signed: 01/05/2020 5:35:06 PM By: Jeffery [name] Signed: 01/08/2020 5:03:49 PM By: Linton Ham MD Entered By: Linton Ham on 01/05/2020 12:56:38 -------------------------------------------------------------------------------- Multi-Disciplinary Care Plan Details Patient Name: Date of Service: Jeffery Raring, [place] [name]. 01/05/2020 10:30 A M Medical Record Number: [id number] Patient Account Number: [id number] Date of Birth/Sex: Treating [name]: 05-25-1960 (59 y.o. Jeffery [name] Primary Care Dimitrios Balestrieri: Jeffery [name] Other Clinician: Referring Bridgette Wolden: Treating Xena Propst/Extender: Jeffery [name]: 10 Active Inactive Abuse / Safety / Falls / Self Care Management Nursing Diagnoses: Potential for falls Potential for injury related to falls Goals: Patient will remain injury free related to falls Date Initiated: 10/23/2019 Target Resolution Date: 02/02/2020 Goal Status: Active Patient/caregiver will verbalize/demonstrate measures taken to prevent injury and/or falls Date Initiated: 10/23/2019 Target Resolution Date: 02/02/2020 Goal Status:  Active Interventions: Assess Activities of Daily Living upon admission and as needed Assess fall risk on admission and as needed Assess: immobility, friction, shearing, incontinence upon admission and as needed Assess impairment of mobility on admission and as needed per policy Assess personal safety and home safety (as indicated) on admission and as needed Assess self care needs on admission and as needed Provide education on fall prevention Provide education on personal and home safety Notes: Venous Leg Ulcer Nursing Diagnoses: Knowledge deficit related to disease process and management Potential for venous Insuffiency (use before diagnosis confirmed) Goals: Patient will maintain optimal edema control Date Initiated: 10/23/2019 Target Resolution Date: 02/02/2020 Goal Status: Active Patient/caregiver will verbalize understanding of disease process and disease management Date Initiated: 10/23/2019 Target Resolution Date: 02/02/2020 Goal Status: Active Interventions: Assess peripheral edema status every visit. Compression as ordered Provide education on venous insufficiency Notes: Wound/Skin Impairment Nursing Diagnoses: Impaired tissue integrity Knowledge deficit related to ulceration/compromised skin integrity Goals: Patient/caregiver will verbalize understanding of skin care regimen Date Initiated: 10/23/2019 Target Resolution Date: 02/02/2020 Goal Status: Active Interventions: Assess patient/caregiver ability to obtain necessary supplies Assess patient/caregiver ability to perform ulcer/skin care regimen upon admission and as needed Assess ulceration(s) every visit Provide education on ulcer and skin care Notes: Electronic Signature(s) Signed: 01/05/2020 5:35:06 PM By: Jeffery [name] Entered By: Jeffery [name]  on 01/05/2020 11:11:37 -------------------------------------------------------------------------------- Pain Assessment Details Patient Name: Date of  Service: Jeffery [name]. 01/05/2020 10:30 A M Medical Record Number: [id number] Patient Account Number: [id number] Date of Birth/Sex: Treating [name]: 1961/02/05 (59 y.o. Jeffery [name] Primary Care Ryan Ogborn: Jeffery [name] Other Clinician: Referring Yitzchok Carriger: Treating Adeline Petitfrere/Extender: Jeffery [name]: 10 Active Problems Location of Pain Severity and Description of Pain Patient Has Paino Yes Site Locations Rate the pain. Current Pain Level: 4 Pain Management and Medication Current Pain Management: Electronic Signature(s) Signed: 01/05/2020 11:39:24 AM By: Jeffery [name] Signed: 01/05/2020 5:35:06 PM By: Jeffery [name] Entered By: Jeffery [name] on 01/05/2020 10:58:20 -------------------------------------------------------------------------------- Patient/Caregiver Education Details Patient Name: Date of Service: [name] 10/8/2021andnbsp10:30 A M Medical Record Number: [id number] Patient Account Number: [id number] Date of Birth/Gender: Treating [name]: [birth year] (59 y.o. Jeffery [name] Primary Care Physician: Jeffery [name] Other Clinician: Referring Physician: Treating Physician/Extender: Jeffery [name] in [name]: 10 Education Assessment Education Provided To: Patient Education Topics Provided Safety: Handouts: Personal Safety Methods: Explain/Verbal Responses: State content correctly Venous: Methods: Explain/Verbal Responses: State content correctly Wound/Skin Impairment: Handouts: Caring for Your Ulcer Methods: Explain/Verbal Responses: State content correctly Electronic Signature(s) Signed: 01/05/2020 5:35:06 PM By: Jeffery [name] Entered By: Jeffery [name] on 01/05/2020 11:12:04 -------------------------------------------------------------------------------- Wound Assessment Details Patient Name: Date of Service: Jeffery [name]. 01/05/2020 10:30 A  M Medical Record Number: [id number] Patient Account Number: [id number] Date of Birth/Sex: Treating [name]: 1960-04-04 (59 y.o. Jeffery [name] Primary Care Sophiah Rolin: Jeffery [name] Other Clinician: Referring Esra Frankowski: Treating Tinsleigh Slovacek/Extender: Jeffery [name]: 10 Wound Status Wound Number: 16 Primary Etiology: Skin Tear Wound Location: Right, Anterior Lower Leg Wound Status: Open Wounding Event: Shear/Friction Comorbid History: Congestive Heart Failure, Hypertension, Type II Diabetes Date Acquired: 12/15/2019 Weeks Of [name]: 3 Clustered Wound: No Wound Measurements Length: (cm) Width: (cm) Depth: (cm) Area: (cm) Volume: (cm) 0 % Reduction in Area: 100% 0 % Reduction in Volume: 100% 0 Epithelialization: Large (67-100%) 0 Tunneling: No 0 Undermining: No Wound Description Classification: Full Thickness Without Exposed Support Structures Wound Margin: Flat and Intact Exudate Amount: None Present Foul Odor After Cleansing: No Slough/Fibrino No Wound Bed Granulation Amount: None Present (0%) Exposed Structure Necrotic Amount: None Present (0%) Fascia Exposed: No Fat Layer (Subcutaneous Tissue) Exposed: No Tendon Exposed: No Muscle Exposed: No Joint Exposed: No Bone Exposed: No Electronic Signature(s) Signed: 01/05/2020 5:42:53 PM By: Jeffery [name] Entered By: Jeffery Gouty on 01/05/2020 11:03:26 -------------------------------------------------------------------------------- Wound Assessment Details Patient Name: Date of Service: Jeffery [name]. 01/05/2020 10:30 A M Medical Record Number: [id number] Patient Account Number: [id number] Date of Birth/Sex: Treating [name]: 1960/10/28 (59 y.o. Jeffery [name] Primary Care Keon Benscoter: Jeffery [name] Other Clinician: Referring Lunetta Marina: Treating Alphonza Tramell/Extender: Jeffery [name]: 10 Wound Status Wound Number: 7 Primary  Etiology: Diabetic Wound/Ulcer of the Lower Extremity Wound Location: Right, Lateral Foot Wound Status: Open Wounding Event: Gradually Appeared Comorbid History: Congestive Heart Failure, Hypertension, Type II Diabetes Date Acquired: 08/02/2019 Weeks Of [name]: 10 Clustered Wound: No Photos Photo Uploaded By: Mikeal Hawthorne on 01/08/2020 13:58:52 Wound Measurements Length: (cm) 1.7 Width: (cm) 1.1 Depth: (cm) 0.1 Area: (cm) 1.469 Volume: (cm) 0.147 % Reduction in Area: 86.1% % Reduction in Volume: 98% Epithelialization: Small (1-33%) Tunneling: No Undermining: No Wound Description Classification: Grade 2 Wound Margin: Flat and Intact Exudate Amount: Medium Exudate Type: Serosanguineous Exudate Color: red, Burger Foul Odor After Cleansing:  No Slough/Fibrino Yes Wound Bed Granulation Amount: Medium (34-66%) Exposed Structure Granulation Quality: Red Fascia Exposed: No Necrotic Amount: Medium (34-66%) Fat Layer (Subcutaneous Tissue) Exposed: Yes Necrotic Quality: Adherent Slough Tendon Exposed: No Muscle Exposed: No Joint Exposed: No Bone Exposed: No [name] Notes Wound #7 (Right, Lateral Foot) 2. Periwound Care Moisturizing lotion TCA Cream 3. Primary Dressing Applied Hydrofera Blue 4. Secondary Dressing Dry Gauze 6. Support Layer Applied 4 layer compression Water quality scientist) Signed: 01/05/2020 5:42:53 PM By: Jeffery [name] Entered By: Jeffery Gouty on 01/05/2020 11:05:31 -------------------------------------------------------------------------------- Ruffin Details Patient Name: Date of Service: Jeffery Raring, [place] [name]. 01/05/2020 10:30 A M Medical Record Number: [id number] Patient Account Number: [id number] Date of Birth/Sex: Treating [name]: 1961-02-07 (59 y.o. Jeffery [name] Primary Care Mykeal Carrick: Jeffery [name] Other Clinician: Referring Aeris Hersman: Treating Keyatta Tolles/Extender: Jeffery [place] Weeks in  [name]: 10 Vital Signs Time Taken: 10:57 Temperature (F): 98.4 Height (in): 74 Pulse (bpm): 83 Weight (lbs): 345 Respiratory Rate (breaths/min): 18 Body Mass Index (BMI): 44.3 Blood Pressure (mmHg): 150/86 Reference Range: 80 - 120 mg / dl Electronic Signature(s) Signed: 01/05/2020 11:39:24 AM By: Jeffery [name] Entered By: Jeffery [name] on 01/05/2020 10:58:10

## 2020-01-08 NOTE — Progress Notes (Signed)
Jeffery [name], Jeffery [name] ([id number]) . Visit Report for 01/05/2020 HPI Details Patient Name: Date of Service: Jeffery Porter [name]. 01/05/2020 10:30 A M Medical Record Number: [id number] Patient Account Number: [id number] Date of Birth/Sex: Treating RN: 03-Feb-1961 (59 y.o. [name] Primary Care Provider: [name] Other Clinician: Referring Provider: Treating Provider/Extender: [name] Weeks in Treatment: 10 History of Present Illness HPI Description: ADMISSION 10/23/2019 This is a complex 59 year old man who is here with wounds on his bilateral lower legs feet and abrasion on his left forearm. These are generally of different etiology. He is a type II diabetic with peripheral neuropathy but does not have known PAD. Looking through [facility] health link I can see he was being followed by Dr. Earleen Newport earlier this year for a diabetic ulcer on the right foot. An MRI done in May showed no osteomyelitis. Shortly thereafter he developed cellulitis of the right leg and foot and was hospitalized from 5/10 through 5/19 with MSSA sepsis. During this hospitalization he was felt to have osteomyelitis he had debridement twice of the right foot and on one occasion apparently a placement of ACell. He again was hospitalized from 6/10 through 7/1 again with left lower extremity cellulitis increasing edema and acute renal failure. An MRI showed left leg cellulitis but no other major findings. 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 He was discharged with multiple wounds on his legs which apparently started as blisters secondary to fluid overload. His creatinine on 7/5 was 7.49 potassium 4 BUN 116 CO2 of 23 albumin of 2.9. He was seen by Dr. Moshe Cipro of nephrology medications were adjusted she is following him. 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 The patient arrives in clinic today  with multiple wounds of different etiologies on his bilateral lower extremities. On the right leg he has superficial areas on the right medial and lateral.. On his right lateral foot I think the original surgical wound. There is a small area at the base of his left second toe. On the left lower extremity a fairly large wound on the left second toe which was trauma from the shower door. He has several areas on the legs. Which are superficial. Finally he has an abrasion injury on his left arm/skin tear Past medical history includes Charcot foot. Left third toe amputation, type 2 diabetes with peripheral neuropathy, paroxysmal A. fib on Eliquis, diastolic heart failure, acute on chronic renal failure as described. ABI in our clinic was 1.18 on the right 1.19 on the left 11/03/2019 upon evaluation today patient appears to be doing well with regard to his ulcers in general. He is making good progress. The worst is his larger right lateral foot ulcer. This wound is going require some sharp debridement today. Other than that he seems to be doing quite well. 11/10/19-Patient has denuded areas on both legs especially the right where there is more stasis changes and friable skin, areas on his right leg appear to be bigger than before, right lateral foot wound appears about the same. We are using Hydrofera Blue to the foot wound and silver alginate on the rest with 4 layer compression 8/19; patient's wounds on his bilateral anterior lower legs look a lot better. These appear to be progressing towards healing and some of them actually have healed. The most substantial area is on the right lateral foot. I thought this was his original surgical wound site.  This has raised hyper granulation tissue and a completely nonviable surface. We have been using silver alginate to all the wounds on his legs under compression and Hydrofera Blue on the right lateral 8/27; most of the wounds are improving here except for the one  on the right lateral foot. Still hyper granulation were using Hydrofera Blue in this area.he does not have an arterial issue. The wound on the right lateral foot I think was an original surgical wound. I wonder whether he inverts at the ankle and not to complicate healing this. In review E does not have an arterial issue.MRI of the foot did not show osteomyelitis in May He still has several open wounds on the bilateral lower legs although these are a lot better. We are using silver alginate under for R compression 9/10; right lateral foot wound which is a surgical wound is still open. He still has bilateral small wounds question venous lymphedema. He developed a new one in the posterior left calf. We have his right leg in compression he has been wearing a compression garment on the left leg. We are going to wrap both legs today. He does not have an arterial issue. MRI did not suggest osteomyelitis of the right foot 9/17; right lateral foot wound still hyper granulated. He has superficial areas on the right anterior lower extremity which are a bit worse this week. There is nothing open on the left leg but the swelling on the left leg is worse than the right this looks like chronic venous insufficiency. We ordered him juxta lite stockings but he still has not received them. He tells me that his creatinine is improving he follows with nephrology. 9/24; right lateral foot. The wound still has a very adherent necrotic surface although I am able to debride this to help something healthy. The area on the right anterior leg in the setting of chronic venous insufficiency and lymphedema is superficial and looks healthy. We will continue with Hydrofera Blue on the foot silver alginate on the leg. We could not get wraparound stockings because he has home health. They are changing the dressing. Home health will not wrap the left leg because he does not have a wound. 10/8; right lateral foot the wound is smaller.  Healthy looking surface no debridement is required. He does have a juxta lite stocking for the left leg there is no open wound here but he does have chronic lymphedema and chronic venous insufficiency with stasis dermatitis looking changes. We will continue to wrap his right leg Electronic Signature(s) Signed: 01/08/2020 5:03:49 PM By: Linton Ham MD Entered By: Linton Ham on 01/05/2020 12:57:57 -------------------------------------------------------------------------------- Physical Exam Details Patient Name: Date of Service: Jeffery Porter [name]. 01/05/2020 10:30 A M Medical Record Number: [id number] Patient Account Number: [id number] Date of Birth/Sex: Treating RN: [birth year] (59 y.o. [name] Primary Care Provider: [name] Other Clinician: Referring Provider: Treating Provider/Extender: [name] Weeks in Treatment: 10 Constitutional Patient is hypertensive.. Pulse regular and within target range for patient.Marland Kitchen Respirations regular, non-labored and within target range.. Temperature is normal and within the target range for the patient.Marland Kitchen Appears in no distress. Cardiovascular Pedal pulses are palpable. Bilateral lower extremity edema nonpitting with some degree of chronic venous stasis. Notes Wound exam Right lateral foot a nice clean healthy looking wound no debridement is required. Is edema in the right leg is controlled with our compression Right anterior leg lower wound appears to be closed He has a compression stocking  for the left leg no open wound here. Electronic Signature(s) Signed: 01/08/2020 5:03:49 PM By: Linton Ham MD Entered By: Linton Ham on 01/05/2020 13:01:03 -------------------------------------------------------------------------------- Physician Orders Details Patient Name: Date of Service: Jeffery Porter [name]. 01/05/2020 10:30 A M Medical Record Number: [id number] Patient Account Number:  [id number] Date of Birth/Sex: Treating RN: 1960-11-12 (59 y.o. [name] Primary Care Provider: [name] Other Clinician: Referring Provider: Treating Provider/Extender: [name] Weeks in Treatment: 10 Verbal / Phone Orders: No Diagnosis Coding ICD-10 Coding Code Description E11.621 Type 2 diabetes mellitus with foot ulcer T81.31XA Disruption of external operation (surgical) wound, not elsewhere classified, initial encounter L97.811 Non-pressure chronic ulcer of other part of right lower leg limited to breakdown of skin L97.821 Non-pressure chronic ulcer of other part of left lower leg limited to breakdown of skin S90.812D Abrasion, left foot, subsequent encounter 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 Dressing Change Frequency Change dressing three times week. - HH to change Monday and Thursday Skin Barriers/Peri-Wound Care Moisturizing lotion - both legs Wound Cleansing Clean wound with Normal Saline. - or wound cleanser Primary Wound Dressing Wound #7 Right,Lateral Foot Hydrofera Blue - Classic Secondary Dressing Dry Gauze - all wounds ABD pad - all wounds on legs Edema Control 4 layer compression - Right Lower Extremity - unna boot to top to anchor Avoid standing for long periods of time Elevate legs to the level of the heart or above for 30 minutes daily and/or when sitting, a frequency of: - throughout the day Exercise regularly Support Garment 30-40 mm/Hg pressure to: - Juxtalite to the Left [place] skilled nursing for wound care. - [facility] Electronic Signature(s) Signed: 01/05/2020 5:35:06 PM By: Kela Millin Signed: 01/08/2020 5:03:49 PM By: Linton Ham MD Entered By: Kela Millin on 01/05/2020  11:57:24 -------------------------------------------------------------------------------- Problem List Details Patient Name: Date of Service: Jeffery Porter [name]. 01/05/2020 10:30 A M Medical Record Number: [id number] Patient Account Number: [id number] Date of Birth/Sex: Treating RN: Apr 24, 1960 (59 y.o. [name] Primary Care Provider: [name] Other Clinician: Referring Provider: Treating Provider/Extender: [name] Weeks in Treatment: 10 Active Problems ICD-10 Encounter Code Description Active Date MDM Diagnosis E11.621 Type 2 diabetes mellitus with foot ulcer 10/23/2019 No Yes T81.31XA Disruption of external operation (surgical) wound, not elsewhere classified, 10/23/2019 No Yes initial encounter L97.811 Non-pressure chronic ulcer of other part of right lower leg limited to breakdown 10/23/2019 No Yes of skin L97.821 Non-pressure chronic ulcer of other part of left lower leg limited to breakdown 10/23/2019 No Yes of skin S90.812D Abrasion, left foot, subsequent encounter 10/23/2019 No Yes E11.22 Type 2 diabetes mellitus with diabetic chronic kidney disease 10/23/2019 No Yes L97.512 Non-pressure chronic ulcer of other part of right foot with fat layer exposed 11/03/2019 No Yes Inactive Problems ICD-10 Code Description Active Date Inactive Date S50.812D Abrasion of left forearm, subsequent encounter 10/23/2019 10/23/2019 Resolved Problems Electronic Signature(s) Signed: 01/05/2020 5:35:06 PM By: Kela Millin Signed: 01/08/2020 5:03:49 PM By: Linton Ham MD Entered By: Kela Millin on 01/05/2020 11:10:20 -------------------------------------------------------------------------------- Progress Note Details Patient Name: Date of Service: Jeffery Porter [name]. 01/05/2020 10:30 A M Medical Record Number: [id number] Patient Account Number: [id number] Date of Birth/Sex: Treating RN: 1960/11/19 (59 y.o. [name] Primary  Care Provider: [name] Other Clinician: Referring Provider:  Treating Provider/Extender: Kandice Robinsons, Charlane Ferretti Weeks in Treatment: 10 Subjective History of Present Illness (HPI) ADMISSION 10/23/2019 This is a complex 59 year old man who is here with wounds on his bilateral lower legs feet and abrasion on his left forearm. These are generally of different etiology. He is a type II diabetic with peripheral neuropathy but does not have known PAD. Looking through [facility] health link I can see he was being followed by Dr. Earleen Newport earlier this year for a diabetic ulcer on the right foot. An MRI done in May showed no osteomyelitis. Shortly thereafter he developed cellulitis of the right leg and foot and was hospitalized from 5/10 through 5/19 with MSSA sepsis. During this hospitalization he was felt to have osteomyelitis he had debridement twice of the right foot and on one occasion apparently a placement of ACell. He again was hospitalized from 6/10 through 7/1 again with left lower extremity cellulitis increasing edema and acute renal failure. An MRI showed left leg cellulitis but no other major findings. 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 He was discharged with multiple wounds on his legs which apparently started as blisters secondary to fluid overload. His creatinine on 7/5 was 7.49 potassium 4 BUN 116 CO2 of 23 albumin of 2.9. He was seen by Dr. Moshe Cipro of nephrology medications were adjusted she is following him. 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 The patient arrives in clinic today with multiple wounds of different etiologies on his bilateral lower extremities. ooOn the right leg he has superficial areas on the right medial and lateral.. On his right lateral foot I think the original surgical wound. There is a small area at the base of his left second toe. ooOn the left lower  extremity a fairly large wound on the left second toe which was trauma from the shower door. He has several areas on the legs. Which are superficial. ooFinally he has an abrasion injury on his left arm/skin tear Past medical history includes Charcot foot. Left third toe amputation, type 2 diabetes with peripheral neuropathy, paroxysmal A. fib on Eliquis, diastolic heart failure, acute on chronic renal failure as described. ABI in our clinic was 1.18 on the right 1.19 on the left 11/03/2019 upon evaluation today patient appears to be doing well with regard to his ulcers in general. He is making good progress. The worst is his larger right lateral foot ulcer. This wound is going require some sharp debridement today. Other than that he seems to be doing quite well. 11/10/19-Patient has denuded areas on both legs especially the right where there is more stasis changes and friable skin, areas on his right leg appear to be bigger than before, right lateral foot wound appears about the same. We are using Hydrofera Blue to the foot wound and silver alginate on the rest with 4 layer compression 8/19; patient's wounds on his bilateral anterior lower legs look a lot better. These appear to be progressing towards healing and some of them actually have healed. The most substantial area is on the right lateral foot. I thought this was his original surgical wound site. This has raised hyper granulation tissue and a completely nonviable surface. We have been using silver alginate to all the wounds on his legs under compression and Hydrofera Blue on the right lateral 8/27; most of the wounds are improving here except for the one on the right lateral foot. Still hyper granulation  were using Hydrofera Blue in this area.he does not have an arterial issue. The wound on the right lateral foot I think was an original surgical wound. I wonder whether he inverts at the ankle and not to complicate healing this. In review E does  not have an arterial issue.MRI of the foot did not show osteomyelitis in May He still has several open wounds on the bilateral lower legs although these are a lot better. We are using silver alginate under for R compression 9/10; right lateral foot wound which is a surgical wound is still open. He still has bilateral small wounds question venous lymphedema. He developed a new one in the posterior left calf. We have his right leg in compression he has been wearing a compression garment on the left leg. We are going to wrap both legs today. He does not have an arterial issue. MRI did not suggest osteomyelitis of the right foot 9/17; right lateral foot wound still hyper granulated. He has superficial areas on the right anterior lower extremity which are a bit worse this week. There is nothing open on the left leg but the swelling on the left leg is worse than the right this looks like chronic venous insufficiency. We ordered him juxta lite stockings but he still has not received them. He tells me that his creatinine is improving he follows with nephrology. 9/24; right lateral foot. The wound still has a very adherent necrotic surface although I am able to debride this to help something healthy. The area on the right anterior leg in the setting of chronic venous insufficiency and lymphedema is superficial and looks healthy. We will continue with Hydrofera Blue on the foot silver alginate on the leg. We could not get wraparound stockings because he has home health. They are changing the dressing. Home health will not wrap the left leg because he does not have a wound. 10/8; right lateral foot the wound is smaller. Healthy looking surface no debridement is required. He does have a juxta lite stocking for the left leg there is no open wound here but he does have chronic lymphedema and chronic venous insufficiency with stasis dermatitis looking changes. We will continue to wrap his right  leg Objective Constitutional Patient is hypertensive.. Pulse regular and within target range for patient.Marland Kitchen Respirations regular, non-labored and within target range.. Temperature is normal and within the target range for the patient.Marland Kitchen Appears in no distress. Vitals Time Taken: 10:57 AM, Height: 74 in, Weight: 345 lbs, BMI: 44.3, Temperature: 98.4 F, Pulse: 83 bpm, Respiratory Rate: 18 breaths/min, Blood Pressure: 150/86 mmHg. Cardiovascular Pedal pulses are palpable. Bilateral lower extremity edema nonpitting with some degree of chronic venous stasis. General Notes: Wound exam ooRight lateral foot a nice clean healthy looking wound no debridement is required. Is edema in the right leg is controlled with our compression ooRight anterior leg lower wound appears to be closed [facility] has a compression stocking for the left leg no open wound here. Integumentary (Hair, Skin) Wound #16 status is Open. Original cause of wound was Shear/Friction. The wound is located on the Right,Anterior Lower Leg. The wound measures 0cm length x 0cm width x 0cm depth; 0cm^2 area and 0cm^3 volume. There is no tunneling or undermining noted. There is a none present amount of drainage noted. The wound margin is flat and intact. There is no granulation within the wound bed. There is no necrotic tissue within the wound bed. Wound #7 status is Open. Original cause of wound was  Gradually Appeared. The wound is located on the Right,Lateral Foot. The wound measures 1.7cm length x 1.1cm width x 0.1cm depth; 1.469cm^2 area and 0.147cm^3 volume. There is Fat Layer (Subcutaneous Tissue) exposed. There is no tunneling or undermining noted. There is a medium amount of serosanguineous drainage noted. The wound margin is flat and intact. There is medium (34-66%) red granulation within the wound bed. There is a medium (34-66%) amount of necrotic tissue within the wound bed including Adherent Slough. 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 There was a Four Layer Compression Therapy Procedure by Deon Pilling, RN. Post procedure Diagnosis Wound #7: Same as Pre-Procedure Plan Follow-up Appointments: Return Appointment in 2 weeks. Dressing Change Frequency: Change dressing three times week. - HH to change Monday and Thursday Skin Barriers/Peri-Wound Care: Moisturizing lotion - both legs Wound Cleansing: Clean wound with Normal Saline. - or wound cleanser Primary Wound Dressing: Wound #7 Right,Lateral Foot: Hydrofera Blue - Classic Secondary Dressing: Dry Gauze - all wounds ABD pad - all wounds on legs Edema Control: 4 layer compression - Right Lower Extremity - unna boot to top to anchor Avoid standing for long periods of time Elevate legs to the level of the heart or above for 30 minutes daily and/or when sitting, a frequency of: - throughout the day Exercise regularly Support Garment 30-40 mm/Hg pressure to: - Juxtalite to the Left Leg Home Health: [place] [facility] for wound care. - [facility] 1. Continue Hydrofera Blue to the right lateral foot/ABDs 4-layer compression on the right 2. His own juxta lite to the left leg and we went over the schedule for this. 3. He has a home health we will see him back in 2 weeks Electronic Signature(s) Signed:  01/08/2020 5:03:49 PM By: Linton Ham MD Entered By: Linton Ham on 01/05/2020 13:01:52 -------------------------------------------------------------------------------- SuperBill Details Patient Name: Date of Service: Jeffery Porter [name]. 01/05/2020 Medical Record Number: [id number] Patient Account Number: [id number] Date of Birth/Sex: Treating RN: 05-06-1960 (59 y.o. [name] Primary Care Provider: [name] Other Clinician: Referring Provider: Treating Provider/Extender: [name] Weeks in Treatment: 10 Diagnosis Coding ICD-10 Codes Code Description E11.621 Type 2 diabetes mellitus with foot ulcer T81.31XA Disruption of external operation (surgical) wound, not elsewhere classified, initial encounter L97.811 Non-pressure chronic ulcer of other part of right lower leg limited to breakdown of skin L97.821 Non-pressure chronic ulcer of other part of left lower leg limited to breakdown of skin S90.812D Abrasion, left foot, subsequent encounter 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 Facility Procedures CPT4 Code: [id number] Description: (Facility Use Only) [facility] LEG Modifier: Quantity: 1 Physician Procedures Electronic Signature(s) Signed: 01/08/2020 5:03:49 PM By: Linton Ham MD Entered By: Linton Ham on 01/05/2020 13:02:18

## 2020-01-11 DIAGNOSIS — D631 Anemia in chronic kidney disease: Secondary | ICD-10-CM | POA: Diagnosis not present

## 2020-01-11 DIAGNOSIS — N1831 Chronic kidney disease, stage 3a: Secondary | ICD-10-CM | POA: Diagnosis not present

## 2020-01-11 DIAGNOSIS — I129 Hypertensive chronic kidney disease with stage 1 through stage 4 chronic kidney disease, or unspecified chronic kidney disease: Secondary | ICD-10-CM | POA: Diagnosis not present

## 2020-01-11 DIAGNOSIS — N179 Acute kidney failure, unspecified: Secondary | ICD-10-CM | POA: Diagnosis not present

## 2020-01-11 DIAGNOSIS — E1122 Type 2 diabetes mellitus with diabetic chronic kidney disease: Secondary | ICD-10-CM | POA: Diagnosis not present

## 2020-01-18 ENCOUNTER — Encounter: Payer: Self-pay | Admitting: Nephrology

## 2020-01-18 ENCOUNTER — Other Ambulatory Visit (HOSPITAL_COMMUNITY): Payer: Self-pay | Admitting: *Deleted

## 2020-01-18 DIAGNOSIS — D631 Anemia in chronic kidney disease: Secondary | ICD-10-CM | POA: Insufficient documentation

## 2020-01-19 ENCOUNTER — Other Ambulatory Visit: Payer: Self-pay

## 2020-01-19 ENCOUNTER — Encounter (HOSPITAL_BASED_OUTPATIENT_CLINIC_OR_DEPARTMENT_OTHER): Payer: Medicaid Other | Admitting: Internal Medicine

## 2020-01-19 ENCOUNTER — Encounter (HOSPITAL_COMMUNITY)
Admission: RE | Admit: 2020-01-19 | Discharge: 2020-01-19 | Disposition: A | Discharge status: Home / Self Care | Payer: Medicaid Other | Source: Ambulatory Visit | Attending: Nephrology | Admitting: Nephrology

## 2020-01-19 VITALS — BP 152/71 | HR 79 | Temp 98.2°F | Resp 20

## 2020-01-19 DIAGNOSIS — D631 Anemia in chronic kidney disease: Secondary | ICD-10-CM

## 2020-01-19 DIAGNOSIS — E11621 Type 2 diabetes mellitus with foot ulcer: Secondary | ICD-10-CM | POA: Diagnosis not present

## 2020-01-19 DIAGNOSIS — L97512 Non-pressure chronic ulcer of other part of right foot with fat layer exposed: Secondary | ICD-10-CM | POA: Diagnosis not present

## 2020-01-19 DIAGNOSIS — N183 Chronic kidney disease, stage 3 unspecified: Secondary | ICD-10-CM | POA: Diagnosis not present

## 2020-01-19 LAB — POCT HEMOGLOBIN-HEMACUE: Hemoglobin: 11.8 g/dL — ABNORMAL LOW (ref 13.0–17.0)

## 2020-01-19 MED ORDER — EPOETIN ALFA-EPBX 10000 UNIT/ML IJ SOLN
INTRAMUSCULAR | Status: AC
  Filled 2020-01-19: qty 2

## 2020-01-19 MED ORDER — EPOETIN ALFA-EPBX 10000 UNIT/ML IJ SOLN
20000.0000 [IU] | INTRAMUSCULAR | Status: DC
  Administered 2020-01-19: 20000 [IU] via SUBCUTANEOUS

## 2020-01-19 NOTE — Progress Notes (Addendum)
KHOLE, BRANCH T ([id number]) . Visit Report for 01/19/2020 Arrival Information Details Patient Name: Date of Service: Jeffery Porter T. 01/19/2020 10:15 A M Medical Record Number: [id number] Patient Account Number: [id number] Date of Birth/Sex: Treating RN: [birth year] (59 y.o. Lorette Ang, Meta.Reding Primary Care Mykelti Goldenstein: [name] Other Clinician: Referring Jiyaan Steinhauser: Treating Ankit Degregorio/Extender: [name] in Treatment: 12 Visit Information History Since Last Visit Added or deleted any medications: No Patient Arrived: Ambulatory Any new allergies or adverse reactions: No Arrival Time: 10:39 Had a fall or experienced change in No Accompanied By: self activities of daily living that may affect Transfer Assistance: None risk of falls: Patient Identification Verified: Yes Signs or symptoms of abuse/neglect since last visito No Secondary Verification Process Completed: Yes Hospitalized since last visit: No Patient Requires Transmission-Based Precautions: No Implantable device outside of the clinic excluding No Patient Has Alerts: No cellular tissue based products placed in the center since last visit: Has Dressing in Place as Prescribed: Yes Has Compression in Place as Prescribed: No Pain Present Now: No Electronic Signature(s) Signed: 01/19/2020 5:18:39 PM By: Deon Pilling Entered By: Deon Pilling on 01/19/2020 10:42:30 -------------------------------------------------------------------------------- Complex / Palliative Patient Assessment Details Patient Name: Date of Service: Jeffery Porter T. 01/19/2020 10:15 A M Medical Record Number: [id number] Patient Account Number: [id number] Date of Birth/Sex: Treating RN: 1960-06-18 (59 y.o. [name] Primary Care Pranav Lince: [name] Other Clinician: Referring Graelyn Bihl: Treating Francisca Langenderfer/Extender: [name] Weeks in Treatment: 12 Palliative Management  Criteria Complex Wound Management Criteria Patient has remarkable or complex co-morbidities requiring medications or treatments that extend wound healing times. Examples: Diabetes mellitus with chronic renal failure or end stage renal disease requiring dialysis Advanced or poorly controlled rheumatoid arthritis Diabetes mellitus and end stage chronic obstructive pulmonary disease Active cancer with current chemo- or radiation therapy Type 2 Diabetes, CHF, CAD, CKD, A-FIB Care Approach Wound Care Plan: Complex Wound Management Electronic Signature(s) Signed: 02/01/2020 5:37:49 PM By: Linton Ham MD Signed: 02/15/2020 10:48:14 AM By: [name] RN, BSN Entered By: [name] on 02/01/2020 10:09:18 -------------------------------------------------------------------------------- Encounter Discharge Information Details Patient Name: Date of Service: Jeffery [name], Jeffery Faster T. 01/19/2020 10:15 A M Medical Record Number: [id number] Patient Account Number: [id number] Date of Birth/Sex: Treating RN: 29-Aug-1960 (59 y.o. Jeffery [name] Primary Care Whitt Auletta: [name] Other Clinician: Referring Kortnee Bas: Treating Sae Handrich/Extender: [name] Weeks in Treatment: 12 Encounter Discharge Information Items Post Procedure Vitals Discharge Condition: Stable Temperature (F): 98.1 Ambulatory Status: Ambulatory Pulse (bpm): 91 Discharge Destination: Home Respiratory Rate (breaths/min): 20 Transportation: Private Auto Blood Pressure (mmHg): 144/79 Accompanied By: self Schedule Follow-up Appointment: Yes Clinical Summary of Care: Electronic Signature(s) Signed: 01/19/2020 5:18:39 PM By: Deon Pilling Entered By: Deon Pilling on 01/19/2020 12:14:17 -------------------------------------------------------------------------------- Lower Extremity Assessment Details Patient Name: Date of Service: Jeffery Porter T. 01/19/2020 10:15 A M Medical Record  Number: [id number] Patient Account Number: [id number] Date of Birth/Sex: Treating RN: 1961/01/24 (59 y.o. Jeffery [name] Primary Care Special Ranes: [name] Other Clinician: Referring Slaton Reaser: Treating Tomie Spizzirri/Extender: [name] Weeks in Treatment: 12 Edema Assessment Assessed: [name]: No] Patrice Paradise: Yes] Edema: [Left: Ye] [Right: s] Calf Left: Right: Point of Measurement: 48 cm From Medial Instep 40 cm Ankle Left: Right: Point of Measurement: 14 cm From Medial Instep 25 cm Vascular Assessment Pulses: Dorsalis Pedis Palpable: [Right:Yes] Electronic Signature(s) Signed: 01/19/2020 5:18:39 PM By: Deon Pilling Entered By: Deon Pilling on 01/19/2020 10:43:07 -------------------------------------------------------------------------------- Multi Wound Chart Details Patient  Name: Date of Service: Jeffery Porter T. 01/19/2020 10:15 A M Medical Record Number: [id number] Patient Account Number: [id number] Date of Birth/Sex: Treating RN: 04/17/1960 (59 y.o. [name] Primary Care Febe Champa: [name] Other Clinician: Referring Zeek Rostron: Treating Yaris Ferrell/Extender: [name] Weeks in Treatment: 12 Vital Signs Height(in): 74 Capillary Blood Glucose(mg/dl): 140 Weight(lbs): 345 Pulse(bpm): 91 Body Mass Index(BMI): 35 Blood Pressure(mmHg): 144/79 Temperature(F): 98.1 Respiratory Rate(breaths/min): 22 Photos: [17:No Photos Right T Fourth oe] [N/A:No Photos N/A Right, Lateral Foot N/A] Wound Location: [17:Trauma] [N/A:Gradually Appeared N/A] Wounding Event: [17:Diabetic Wound/Ulcer of the Lower] [N/A:Diabetic Wound/Ulcer of the Lower N/A] Primary Etiology: [17:Extremity Congestive Heart Failure,] [N/A:Extremity Congestive Heart Failure, N/A] Comorbid History: [17:Hypertension, Type II Diabetes 01/12/2020] [N/A:Hypertension, Type II Diabetes 08/02/2019 N/A] Date Acquired: [17:0] [N/A:12 N/A] Weeks of Treatment:  [17:Open] [N/A:Open N/A] Wound Status: [17:0.3x0.7x0.1] [N/A:1.5x1.2x0.1 N/A] Measurements L x W x D (cm) [17:0.165] [N/A:1.414 N/A] A (cm) : rea [17:0.016] [N/A:0.141 N/A] Volume (cm) : [17:N/A] [N/A:86.70% N/A] % Reduction in A [17:rea: N/A] [N/A:98.10% N/A] % Reduction in Volume: [17:Unable to visualize wound bed] [N/A:Grade 2 N/A] Classification: [17:Medium] [N/A:Medium N/A] Exudate A mount: [17:Serosanguineous] [N/A:Serosanguineous N/A] Exudate Type: [17:red, Fukuhara] [N/A:red, Rodarte N/A] Exudate Color: [17:Distinct, outline attached] [N/A:Flat and Intact N/A] Wound Margin: [17:None Present (0%)] [N/A:Large (67-100%) N/A] Granulation A mount: [17:N/A] [N/A:Red N/A] Granulation Quality: [17:Large (67-100%)] [N/A:Small (1-33%) N/A] Necrotic A mount: [17:Fascia: No] [N/A:Fat Layer (Subcutaneous Tissue): Yes N/A] Exposed Structures: [17:Fat Layer (Subcutaneous Tissue): No Tendon: No Muscle: No Joint: No Bone: No None] [N/A:Fascia: No Tendon: No Muscle: No Joint: No Bone: No Small (1-33%) N/A] Epithelialization: [17:Debridement - Excisional] [N/A:Debridement - Excisional N/A] Debridement: Pre-procedure Verification/Time Out 11:40 [N/A:11:40 N/A] Taken: [17:Lidocaine 4% Topical Solution] [N/A:Lidocaine 4% Topical Solution N/A] Pain Control: [17:Subcutaneous, Slough] [N/A:Subcutaneous, Slough N/A] Tissue Debrided: [17:Skin/Subcutaneous Tissue] [N/A:Skin/Subcutaneous Tissue N/A] Level: [17:0.21] [N/A:1.8 N/A] Debridement A (sq cm): [17:rea Curette] [N/A:Curette N/A] Instrument: [17:Minimum] [N/A:Minimum N/A] Bleeding: [17:Pressure] [N/A:Pressure N/A] Hemostasis A chieved: [17:0] [N/A:0 N/A] Procedural Pain: [17:0] [N/A:0 N/A] Post Procedural Pain: [17:Procedure was tolerated well] [N/A:Procedure was tolerated well N/A] Debridement Treatment Response: [17:0.3x0.7x0.1] [N/A:1.5x1.2x0.1 N/A] Post Debridement Measurements L x W x D (cm) [17:0.016] [N/A:0.141 N/A] Post Debridement Volume:  (cm) [17:Debridement] [N/A:Debridement N/A] Treatment Notes Wound #17 (Right Toe Fourth) 1. Cleanse With Wound Cleanser 3. Primary Dressing Applied Hydrofera Blue 4. Secondary Dressing Dry Gauze Roll Gauze 5. Secured With Medipore tape Wound #7 (Right, Lateral Foot) 1. Cleanse With Wound Cleanser Soap and water 2. Periwound Care Moisturizing lotion 3. Primary Dressing Applied Hydrofera Blue 4. Secondary Dressing Dry Gauze 6. Support Layer Applied 4 layer compression Water quality scientist) Signed: 01/19/2020 4:52:36 PM By: Linton Ham MD Signed: 01/19/2020 5:21:31 PM By: Baruch Gouty RN, BSN Entered By: Linton Ham on 01/19/2020 12:15:43 -------------------------------------------------------------------------------- Gay Details Patient Name: Date of Service: Jeffery [name], [name] 01/19/2020 10:15 A M Medical Record Number: [id number] Patient Account Number: [id number] Date of Birth/Sex: Treating RN: Apr 12, 1960 (59 y.o. [name] Primary Care Kel Senn: [name] Other Clinician: Referring Marshawn Ninneman: Treating Mavery Milling/Extender: [name] Weeks in Treatment: 12 Active Inactive Abuse / Safety / Falls / Self Care Management Nursing Diagnoses: Potential for falls Potential for injury related to falls Goals: Patient will remain injury free related to falls Date Initiated: 10/23/2019 Target Resolution Date: 02/02/2020 Goal Status: Active Patient/caregiver will verbalize/demonstrate measures taken to prevent injury and/or falls Date Initiated: 10/23/2019 Target Resolution Date: 02/02/2020 Goal Status: Active Interventions: Assess  Activities of Daily Living upon admission and as needed Assess fall risk on admission and as needed Assess: immobility, friction, shearing, incontinence upon admission and as needed Assess impairment of mobility on admission and as needed per policy Assess personal  safety and home safety (as indicated) on admission and as needed Assess self care needs on admission and as needed Provide education on fall prevention Provide education on personal and home safety Notes: Venous Leg Ulcer Nursing Diagnoses: Knowledge deficit related to disease process and management Potential for venous Insuffiency (use before diagnosis confirmed) Goals: Patient will maintain optimal edema control Date Initiated: 10/23/2019 Target Resolution Date: 02/02/2020 Goal Status: Active Patient/caregiver will verbalize understanding of disease process and disease management Date Initiated: 10/23/2019 Target Resolution Date: 02/02/2020 Goal Status: Active Interventions: Assess peripheral edema status every visit. Compression as ordered Provide education on venous insufficiency Notes: Wound/Skin Impairment Nursing Diagnoses: Impaired tissue integrity Knowledge deficit related to ulceration/compromised skin integrity Goals: Patient/caregiver will verbalize understanding of skin care regimen Date Initiated: 10/23/2019 Target Resolution Date: 02/02/2020 Goal Status: Active Interventions: Assess patient/caregiver ability to obtain necessary supplies Assess patient/caregiver ability to perform ulcer/skin care regimen upon admission and as needed Assess ulceration(s) every visit Provide education on ulcer and skin care Notes: Electronic Signature(s) Signed: 01/19/2020 5:21:31 PM By: Baruch Gouty RN, BSN Entered By: Baruch Gouty on 01/19/2020 11:40:35 -------------------------------------------------------------------------------- Pain Assessment Details Patient Name: Date of Service: Jeffery Porter T. 01/19/2020 10:15 A M Medical Record Number: [id number] Patient Account Number: [id number] Date of Birth/Sex: Treating RN: 03-May-1960 (59 y.o. Jeffery [name] Primary Care Vivia Rosenburg: [name] Other Clinician: Referring Loucinda Croy: Treating Nora Sabey/Extender:  [name] Weeks in Treatment: 12 Active Problems Location of Pain Severity and Description of Pain Patient Has Paino No Site Locations Rate the pain. Rate the pain. Current Pain Level: 0 Pain Management and Medication Current Pain Management: Medication: No Cold Application: No Rest: No Massage: No Activity: No ChandlerE.N.S.: No Heat Application: No Leg drop or elevation: No Is the Current Pain Management Adequate: Adequate How does your wound impact your activities of daily livingo Sleep: No Bathing: No Appetite: No Relationship With Others: No Bladder Continence: No Emotions: No Bowel Continence: No Work: No Toileting: No Drive: No Dressing: No Hobbies: No Electronic Signature(s) Signed: 01/19/2020 5:18:39 PM By: Deon Pilling Entered By: Deon Pilling on 01/19/2020 10:42:58 -------------------------------------------------------------------------------- Patient/Caregiver Education Details Patient Name: Date of Service: Jeffery Porter T. 10/22/2021andnbsp10:15 A M Medical Record Number: [id number] Patient Account Number: [id number] Date of Birth/Gender: Treating RN: [birth year] (59 y.o. [name] Primary Care Physician: [name] Other Clinician: Referring Physician: Treating Physician/Extender: [name] in Treatment: 12 Education Assessment Education Provided To: Patient Education Topics Provided Venous: Methods: Explain/Verbal Responses: Reinforcements needed, State content correctly Wound/Skin Impairment: Methods: Explain/Verbal Responses: Reinforcements needed, State content correctly Electronic Signature(s) Signed: 01/19/2020 5:21:31 PM By: Baruch Gouty RN, BSN Signed: 01/19/2020 5:21:31 PM By: Baruch Gouty RN, BSN Entered By: Baruch Gouty on 01/19/2020 11:41:10 -------------------------------------------------------------------------------- Wound Assessment  Details Patient Name: Date of Service: Jeffery Porter T. 01/19/2020 10:15 A M Medical Record Number: [id number] Patient Account Number: [id number] Date of Birth/Sex: Treating RN: 1960/07/12 (59 y.o. Jeffery [name] Primary Care Leena Tiede: [name] Other Clinician: Referring Merrianne Mccumbers: Treating Manessa Buley/Extender: [name] Weeks in Treatment: 12 Wound Status Wound Number: 17 Primary Etiology: Diabetic Wound/Ulcer of the Lower Extremity Wound Location: Right T Fourth oe Wound Status: Open Wounding Event: Trauma Comorbid History: Congestive Heart  Failure, Hypertension, Type II Diabetes Date Acquired: 01/12/2020 Weeks Of Treatment: 0 Clustered Wound: No Photos Photo Uploaded By: Mikeal Hawthorne on 01/24/2020 11:49:37 Wound Measurements Length: (cm) 0.3 Width: (cm) 0.7 Depth: (cm) 0.1 Area: (cm) 0.165 Volume: (cm) 0.016 % Reduction in Area: % Reduction in Volume: Epithelialization: None Tunneling: No Undermining: No Wound Description Classification: Unable to visualize wound bed Wound Margin: Distinct, outline attached Exudate Amount: Medium Exudate Type: Serosanguineous Exudate Color: red, Wettstein Foul Odor After Cleansing: No Slough/Fibrino Yes Wound Bed Granulation Amount: None Present (0%) Exposed Structure Necrotic Amount: Large (67-100%) Fascia Exposed: No Necrotic Quality: Adherent Slough Fat Layer (Subcutaneous Tissue) Exposed: No Tendon Exposed: No Muscle Exposed: No Joint Exposed: No Bone Exposed: No Electronic Signature(s) Signed: 01/19/2020 5:18:39 PM By: Deon Pilling Entered By: Deon Pilling on 01/19/2020 10:48:11 -------------------------------------------------------------------------------- Wound Assessment Details Patient Name: Date of Service: Jeffery Porter T. 01/19/2020 10:15 A M Medical Record Number: [id number] Patient Account Number: [id number] Date of Birth/Sex: Treating RN: December 08, 1960 (59 y.o.  Jeffery [name] Primary Care Istvan Behar: [name] Other Clinician: Referring Kentley Blyden: Treating Merita Hawks/Extender: [name] Weeks in Treatment: 12 Wound Status Wound Number: 7 Primary Etiology: Diabetic Wound/Ulcer of the Lower Extremity Wound Location: Right, Lateral Foot Wound Status: Open Wounding Event: Gradually Appeared Comorbid History: Congestive Heart Failure, Hypertension, Type II Diabetes Date Acquired: 08/02/2019 Weeks Of Treatment: 12 Clustered Wound: No Photos Photo Uploaded By: Mikeal Hawthorne on 01/24/2020 11:49:37 Wound Measurements Length: (cm) 1.5 Width: (cm) 1.2 Depth: (cm) 0.1 Area: (cm) 1.414 Volume: (cm) 0.141 % Reduction in Area: 86.7% % Reduction in Volume: 98.1% Epithelialization: Small (1-33%) Tunneling: No Undermining: No Wound Description Classification: Grade 2 Wound Margin: Flat and Intact Exudate Amount: Medium Exudate Type: Serosanguineous Exudate Color: red, Beavin Foul Odor After Cleansing: No Slough/Fibrino Yes Wound Bed Granulation Amount: Large (67-100%) Exposed Structure Granulation Quality: Red Fascia Exposed: No Necrotic Amount: Small (1-33%) Fat Layer (Subcutaneous Tissue) Exposed: Yes Necrotic Quality: Adherent Slough Tendon Exposed: No Muscle Exposed: No Joint Exposed: No Bone Exposed: No Electronic Signature(s) Signed: 01/19/2020 5:18:39 PM By: Deon Pilling Entered By: Deon Pilling on 01/19/2020 10:43:27 -------------------------------------------------------------------------------- Vitals Details Patient Name: Date of Service: Jeffery [name], Jeffery [place] T. 01/19/2020 10:15 A M Medical Record Number: [id number] Patient Account Number: [id number] Date of Birth/Sex: Treating RN: 08-06-1960 (59 y.o. Jeffery [name] Primary Care Ellicia Alix: [name] Other Clinician: Referring Navil Kole: Treating Arrick Dutton/Extender: [name] Weeks in Treatment: 12 Vital  Signs Time Taken: 10:39 Temperature (F): 98.1 Height (in): 74 Pulse (bpm): 91 Weight (lbs): 345 Respiratory Rate (breaths/min): 22 Body Mass Index (BMI): 44.3 Blood Pressure (mmHg): 144/79 Capillary Blood Glucose (mg/dl): 140 Reference Range: 80 - 120 mg / dl Electronic Signature(s) Signed: 01/19/2020 5:18:39 PM By: Deon Pilling Entered By: Deon Pilling on 01/19/2020 10:42:51

## 2020-01-19 NOTE — Progress Notes (Signed)
ARMAS, MCBEE T ([id number]) . Visit Report for 01/19/2020 Debridement Details Patient Name: Date of Service: Jeffery Porter T. 01/19/2020 10:15 A M Medical Record Number: [id number] Patient Account Number: [id number] Date of Birth/Sex: Treating RN: [birth year] (59 y.o. [name] Primary Care Provider: [name] Other Clinician: Referring Provider: Treating Provider/Extender: [name] Weeks in Treatment: 12 Debridement Performed for Assessment: Wound #17 Right T Fourth oe Performed By: Physician Ricard Dillon., MD Debridement Type: Debridement Severity of Tissue Pre Debridement: Fat layer exposed Level of Consciousness (Pre-procedure): Awake and Alert Pre-procedure Verification/Time Out Yes - 11:40 Taken: Start Time: 11:41 Pain Control: Lidocaine 4% T opical Solution T Area Debrided (L x W): otal 0.3 (cm) x 0.7 (cm) = 0.21 (cm) Tissue and other material debrided: Viable, Non-Viable, Slough, Subcutaneous, Slough Level: Skin/Subcutaneous Tissue Debridement Description: Excisional Instrument: Curette Bleeding: Minimum Hemostasis Achieved: Pressure End Time: 11:43 Procedural Pain: 0 Post Procedural Pain: 0 Response to Treatment: Procedure was tolerated well Level of Consciousness (Post- Awake and Alert procedure): Post Debridement Measurements of Total Wound Length: (cm) 0.3 Width: (cm) 0.7 Depth: (cm) 0.1 Volume: (cm) 0.016 Character of Wound/Ulcer Post Debridement: Improved Severity of Tissue Post Debridement: Fat layer exposed Post Procedure Diagnosis Same as Pre-procedure Electronic Signature(s) Signed: 01/19/2020 4:52:36 PM By: Linton Ham MD Signed: 01/19/2020 5:21:31 PM By: Baruch Gouty RN, BSN Entered By: Linton Ham on 01/19/2020 12:15:57 -------------------------------------------------------------------------------- Debridement Details Patient Name: Date of Service: Jeffery Porter T.  01/19/2020 10:15 A M Medical Record Number: [id number] Patient Account Number: [id number] Date of Birth/Sex: Treating RN: October 16, 1960 (59 y.o. [name] Primary Care Provider: [name] Other Clinician: Referring Provider: Treating Provider/Extender: [name] Weeks in Treatment: 12 Debridement Performed for Assessment: Wound #7 Right,Lateral Foot Performed By: Physician Ricard Dillon., MD Debridement Type: Debridement Severity of Tissue Pre Debridement: Fat layer exposed Level of Consciousness (Pre-procedure): Awake and Alert Pre-procedure Verification/Time Out Yes - 11:40 Taken: Start Time: 11:41 Pain Control: Lidocaine 4% T opical Solution T Area Debrided (L x W): otal 1.5 (cm) x 1.2 (cm) = 1.8 (cm) Tissue and other material debrided: Viable, Non-Viable, Slough, Subcutaneous, Slough Level: Skin/Subcutaneous Tissue Debridement Description: Excisional Instrument: Curette Bleeding: Minimum Hemostasis Achieved: Pressure End Time: 11:43 Procedural Pain: 0 Post Procedural Pain: 0 Response to Treatment: Procedure was tolerated well Level of Consciousness (Post- Awake and Alert procedure): Post Debridement Measurements of Total Wound Length: (cm) 1.5 Width: (cm) 1.2 Depth: (cm) 0.1 Volume: (cm) 0.141 Character of Wound/Ulcer Post Debridement: Improved Severity of Tissue Post Debridement: Fat layer exposed Post Procedure Diagnosis Same as Pre-procedure Electronic Signature(s) Signed: 01/19/2020 4:52:36 PM By: Linton Ham MD Signed: 01/19/2020 5:21:31 PM By: Baruch Gouty RN, BSN Entered By: Linton Ham on 01/19/2020 12:16:06 -------------------------------------------------------------------------------- HPI Details Patient Name: Date of Service: Jeffery Porter T. 01/19/2020 10:15 A M Medical Record Number: [id number] Patient Account Number: [id number] Date of Birth/Sex: Treating RN: 09-24-1960 (59 y.o. [name] Primary Care Provider: [name] Other Clinician: Referring Provider: Treating Provider/Extender: [name] Weeks in Treatment: 12 History of Present Illness HPI Description: ADMISSION 10/23/2019 This is a complex 59 year old man who is here with wounds on his bilateral lower legs feet and abrasion on his left forearm. These are generally of different etiology. He is a type II diabetic with peripheral neuropathy but does not have known PAD. Looking through [facility] health link I can see he was being followed by Dr. Earleen Newport earlier this  year for a diabetic ulcer on the right foot. An MRI done in May showed no osteomyelitis. Shortly thereafter he developed cellulitis of the right leg and foot and was hospitalized from 5/10 through 5/19 with MSSA sepsis. During this hospitalization he was felt to have osteomyelitis he had debridement twice of the right foot and on one occasion apparently a placement of ACell. He again was hospitalized from 6/10 through 7/1 again with left lower extremity cellulitis increasing edema and acute renal failure. An MRI showed left leg cellulitis but no other major findings. 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 He was discharged with multiple wounds on his legs which apparently started as blisters secondary to fluid overload. His creatinine on 7/5 was 7.49 potassium 4 BUN 116 CO2 of 23 albumin of 2.9. He was seen by Dr. Moshe Cipro of nephrology medications were adjusted she is following him. 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 The patient arrives in clinic today with multiple wounds of different etiologies on his bilateral lower extremities. On the right leg he has superficial areas on the right medial and lateral.. On his right lateral foot I think the original surgical wound. There is a small area at the base of his left second toe. On the  left lower extremity a fairly large wound on the left second toe which was trauma from the shower door. He has several areas on the legs. Which are superficial. Finally he has an abrasion injury on his left arm/skin tear Past medical history includes Charcot foot. Left third toe amputation, type 2 diabetes with peripheral neuropathy, paroxysmal A. fib on Eliquis, diastolic heart failure, acute on chronic renal failure as described. ABI in our clinic was 1.18 on the right 1.19 on the left 11/03/2019 upon evaluation today patient appears to be doing well with regard to his ulcers in general. He is making good progress. The worst is his larger right lateral foot ulcer. This wound is going require some sharp debridement today. Other than that he seems to be doing quite well. 11/10/19-Patient has denuded areas on both legs especially the right where there is more stasis changes and friable skin, areas on his right leg appear to be bigger than before, right lateral foot wound appears about the same. We are using Hydrofera Blue to the foot wound and silver alginate on the rest with 4 layer compression 8/19; patient's wounds on his bilateral anterior lower legs look a lot better. These appear to be progressing towards healing and some of them actually have healed. The most substantial area is on the right lateral foot. I thought this was his original surgical wound site. This has raised hyper granulation tissue and a completely nonviable surface. We have been using silver alginate to all the wounds on his legs under compression and Hydrofera Blue on the right lateral 8/27; most of the wounds are improving here except for the one on the right lateral foot. Still hyper granulation were using Hydrofera Blue in this area.he does not have an arterial issue. The wound on the right lateral foot I think was an original surgical wound. I wonder whether he inverts at the ankle and not to complicate healing this. In  review E does not have an arterial issue.MRI of the foot did not show osteomyelitis in May He still has several open wounds on the bilateral lower legs although these are a lot better. We are  using silver alginate under for R compression 9/10; right lateral foot wound which is a surgical wound is still open. He still has bilateral small wounds question venous lymphedema. He developed a new one in the posterior left calf. We have his right leg in compression he has been wearing a compression garment on the left leg. We are going to wrap both legs today. He does not have an arterial issue. MRI did not suggest osteomyelitis of the right foot 9/17; right lateral foot wound still hyper granulated. He has superficial areas on the right anterior lower extremity which are a bit worse this week. There is nothing open on the left leg but the swelling on the left leg is worse than the right this looks like chronic venous insufficiency. We ordered him juxta lite stockings but he still has not received them. He tells me that his creatinine is improving he follows with nephrology. 9/24; right lateral foot. The wound still has a very adherent necrotic surface although I am able to debride this to help something healthy. The area on the right anterior leg in the setting of chronic venous insufficiency and lymphedema is superficial and looks healthy. We will continue with Hydrofera Blue on the foot silver alginate on the leg. We could not get wraparound stockings because he has home health. They are changing the dressing. Home health will not wrap the left leg because he does not have a wound. 10/8; right lateral foot the wound is smaller. Healthy looking surface no debridement is required. He does have a juxta lite stocking for the left leg there is no open wound here but he does have chronic lymphedema and chronic venous insufficiency with stasis dermatitis looking changes. We will continue to wrap his right  leg 10/22; right lateral foot slightly smaller debris on the surface. Comes in today with a new area on the right fourth toe over the DIP. I am not precisely sure how this happened. It looks like a foot wear injury although he denies this states he never wears shoes at home and he only wears them when he goes out to appointments. He says he took his sock off and noticed blood there. He has a juxta lite stocking for the left leg we have been wrapping the right Electronic Signature(s) Signed: 01/19/2020 4:52:36 PM By: Linton Ham MD Entered By: Linton Ham on 01/19/2020 12:16:56 -------------------------------------------------------------------------------- Physical Exam Details Patient Name: Date of Service: Jeffery Porter T. 01/19/2020 10:15 A M Medical Record Number: [id number] Patient Account Number: [id number] Date of Birth/Sex: Treating RN: Aug 01, 1960 (59 y.o. [name] Primary Care Provider: [name] Other Clinician: Referring Provider: Treating Provider/Extender: [name] Weeks in Treatment: 12 Constitutional Patient is hypertensive.. Pulse regular and within target range for patient.Marland Kitchen Respirations regular, non-labored and within target range.. Temperature is normal and within the target range for the patient.Marland Kitchen Appears in no distress. Notes Wound exam; right lateral foot abrasion on the surface debrided with a #3 curette hemostasis with direct pressure She has a new wound on the dorsal right fourth toe over the PIP. Again very adherent debris on the surface removed with a #3 curette subcutaneous debris. Hemostasis with direct Electronic Signature(s) Signed: 01/19/2020 4:52:36 PM By: Linton Ham MD Entered By: Linton Ham on 01/19/2020 12:17:57 -------------------------------------------------------------------------------- Physician Orders Details Patient Name: Date of Service: Jeffery Porter T. 01/19/2020 10:15  A M Medical Record Number: [id number] Patient Account Number: [id number] Date of Birth/Sex: Treating RN: 1960/08/28 (58  y.o. [name] Primary Care Provider: [name] Other Clinician: Referring Provider: Treating Provider/Extender: [name] Weeks in Treatment: 12 Verbal / Phone Orders: No Diagnosis Coding ICD-10 Coding Code Description E11.621 Type 2 diabetes mellitus with foot ulcer T81.31XA Disruption of external operation (surgical) wound, not elsewhere classified, initial encounter L97.811 Non-pressure chronic ulcer of other part of right lower leg limited to breakdown of skin L97.821 Non-pressure chronic ulcer of other part of left lower leg limited to breakdown of skin S90.812D Abrasion, left foot, subsequent encounter 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 Dressing Change Frequency Change dressing three times week. - HH to change Monday and Thursday Skin Barriers/Peri-Wound Care Moisturizing lotion - both legs Wound Cleansing Clean wound with Normal Saline. - or wound cleanser Primary Wound Dressing Wound #17 Right T Fourth oe Hydrofera Blue - Classic Wound #7 Right,Lateral Foot Hydrofera Blue - Classic Secondary Dressing Wound #7 Right,Lateral Foot Dry Gauze Wound #17 Right T Fourth oe Kerlix/Rolled Gauze Dry Gauze Edema Control 4 layer compression - Right Lower Extremity - unna boot to top to anchor Avoid standing for long periods of time Elevate legs to the level of the heart or above for 30 minutes daily and/or when sitting, a frequency of: - throughout the day Exercise regularly Support Garment 30-40 mm/Hg pressure to: - Juxtalite to the Left Leg daily [facility] skilled nursing for wound care. - [facility] Electronic  Signature(s) Signed: 01/19/2020 4:52:36 PM By: Linton Ham MD Signed: 01/19/2020 5:21:31 PM By: Baruch Gouty RN, BSN Entered By: Baruch Gouty on 01/19/2020 11:45:43 -------------------------------------------------------------------------------- Problem List Details Patient Name: Date of Service: Jeffery Porter T. 01/19/2020 10:15 A M Medical Record Number: [id number] Patient Account Number: [id number] Date of Birth/Sex: Treating RN: [birth year] (59 y.o. [name] Primary Care Provider: [name] Other Clinician: Referring Provider: Treating Provider/Extender: [name] Weeks in Treatment: 12 Active Problems ICD-10 Encounter Code Description Active Date MDM Diagnosis E11.621 Type 2 diabetes mellitus with foot ulcer 10/23/2019 No Yes T81.31XA Disruption of external operation (surgical) wound, not elsewhere classified, 10/23/2019 No Yes initial encounter L97.512 Non-pressure chronic ulcer of other part of right foot with fat layer exposed 11/03/2019 No Yes E11.22 Type 2 diabetes mellitus with diabetic chronic kidney disease 10/23/2019 No Yes Inactive Problems ICD-10 Code Description Active Date Inactive Date S50.812D Abrasion of left forearm, subsequent encounter 10/23/2019 10/23/2019 L97.821 Non-pressure chronic ulcer of other part of left lower leg limited to breakdown of skin 10/23/2019 10/23/2019 S90.812D Abrasion, left foot, subsequent encounter 10/23/2019 10/23/2019 L97.811 Non-pressure chronic ulcer of other part of right lower leg limited to breakdown of skin 10/23/2019 10/23/2019 Resolved Problems Electronic Signature(s) Signed: 01/19/2020 4:52:36 PM By: Linton Ham MD Entered By: Linton Ham on 01/19/2020 12:15:24 -------------------------------------------------------------------------------- Progress Note Details Patient Name: Date of Service: Jeffery Porter T. 01/19/2020 10:15 A M Medical Record Number:  [id number] Patient Account Number: [id number] Date of Birth/Sex: Treating RN: 02-25-1961 (59 y.o. [name] Primary Care Provider: [name] Other Clinician: Referring Provider: Treating Provider/Extender: [name] Weeks in Treatment: 12 Subjective History of Present Illness (HPI) ADMISSION 10/23/2019 This is a complex 59 year old man who is here with wounds on his bilateral lower legs feet and abrasion on his left forearm. These are generally of different etiology. He is a type II  diabetic with peripheral neuropathy but does not have known PAD. Looking through [facility] health link I can see he was being followed by Dr. Earleen Newport earlier this year for a diabetic ulcer on the right foot. An MRI done in May showed no osteomyelitis. Shortly thereafter he developed cellulitis of the right leg and foot and was hospitalized from 5/10 through 5/19 with MSSA sepsis. During this hospitalization he was felt to have osteomyelitis he had debridement twice of the right foot and on one occasion apparently a placement of ACell. He again was hospitalized from 6/10 through 7/1 again with left lower extremity cellulitis increasing edema and acute renal failure. An MRI showed left leg cellulitis but no other major findings. 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 He was discharged with multiple wounds on his legs which apparently started as blisters secondary to fluid overload. His creatinine on 7/5 was 7.49 potassium 4 BUN 116 CO2 of 23 albumin of 2.9. He was seen by Dr. Moshe Cipro of nephrology medications were adjusted she is following him. 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 The patient arrives in clinic today with multiple wounds of different etiologies on his bilateral lower extremities. ooOn the right leg he has superficial areas on the right medial and lateral.. On his right  lateral foot I think the original surgical wound. There is a small area at the base of his left second toe. ooOn the left lower extremity a fairly large wound on the left second toe which was trauma from the shower door. He has several areas on the legs. Which are superficial. ooFinally he has an abrasion injury on his left arm/skin tear Past medical history includes Charcot foot. Left third toe amputation, type 2 diabetes with peripheral neuropathy, paroxysmal A. fib on Eliquis, diastolic heart failure, acute on chronic renal failure as described. ABI in our clinic was 1.18 on the right 1.19 on the left 11/03/2019 upon evaluation today patient appears to be doing well with regard to his ulcers in general. He is making good progress. The worst is his larger right lateral foot ulcer. This wound is going require some sharp debridement today. Other than that he seems to be doing quite well. 11/10/19-Patient has denuded areas on both legs especially the right where there is more stasis changes and friable skin, areas on his right leg appear to be bigger than before, right lateral foot wound appears about the same. We are using Hydrofera Blue to the foot wound and silver alginate on the rest with 4 layer compression 8/19; patient's wounds on his bilateral anterior lower legs look a lot better. These appear to be progressing towards healing and some of them actually have healed. The most substantial area is on the right lateral foot. I thought this was his original surgical wound site. This has raised hyper granulation tissue and a completely nonviable surface. We have been using silver alginate to all the wounds on his legs under compression and Hydrofera Blue on the right lateral 8/27; most of the wounds are improving here except for the one on the right lateral foot. Still hyper granulation were using Hydrofera Blue in this area.he does not have an arterial issue. The wound on the right lateral foot I  think was an original surgical wound. I wonder whether he inverts at the ankle and not to complicate healing this. In review E does not have an arterial issue.MRI of  the foot did not show osteomyelitis in May He still has several open wounds on the bilateral lower legs although these are a lot better. We are using silver alginate under for R compression 9/10; right lateral foot wound which is a surgical wound is still open. He still has bilateral small wounds question venous lymphedema. He developed a new one in the posterior left calf. We have his right leg in compression he has been wearing a compression garment on the left leg. We are going to wrap both legs today. He does not have an arterial issue. MRI did not suggest osteomyelitis of the right foot 9/17; right lateral foot wound still hyper granulated. He has superficial areas on the right anterior lower extremity which are a bit worse this week. There is nothing open on the left leg but the swelling on the left leg is worse than the right this looks like chronic venous insufficiency. We ordered him juxta lite stockings but he still has not received them. He tells me that his creatinine is improving he follows with nephrology. 9/24; right lateral foot. The wound still has a very adherent necrotic surface although I am able to debride this to help something healthy. The area on the right anterior leg in the setting of chronic venous insufficiency and lymphedema is superficial and looks healthy. We will continue with Hydrofera Blue on the foot silver alginate on the leg. We could not get wraparound stockings because he has home health. They are changing the dressing. Home health will not wrap the left leg because he does not have a wound. 10/8; right lateral foot the wound is smaller. Healthy looking surface no debridement is required. He does have a juxta lite stocking for the left leg there is no open wound here but he does have chronic  lymphedema and chronic venous insufficiency with stasis dermatitis looking changes. We will continue to wrap his right leg 10/22; right lateral foot slightly smaller debris on the surface. Comes in today with a new area on the right fourth toe over the DIP. I am not precisely sure how this happened. It looks like a foot wear injury although he denies this states he never wears shoes at home and he only wears them when he goes out to appointments. He says he took his sock off and noticed blood there. He has a juxta lite stocking for the left leg we have been wrapping the right Objective Constitutional Patient is hypertensive.. Pulse regular and within target range for patient.Marland Kitchen Respirations regular, non-labored and within target range.. Temperature is normal and within the target range for the patient.Marland Kitchen Appears in no distress. Vitals Time Taken: 10:39 AM, Height: 74 in, Weight: 345 lbs, BMI: 44.3, Temperature: 98.1 F, Pulse: 91 bpm, Respiratory Rate: 22 breaths/min, Blood Pressure: 144/79 mmHg, Capillary Blood Glucose: 140 mg/dl. General Notes: Wound exam; right lateral foot abrasion on the surface debrided with a #3 curette hemostasis with direct pressure ooShe has a new wound on the dorsal right fourth toe over the PIP. Again very adherent debris on the surface removed with a #3 curette subcutaneous debris. Hemostasis with direct Integumentary (Hair, Skin) Wound #17 status is Open. Original cause of wound was Trauma. The wound is located on the Right T Fourth. The wound measures 0.3cm length x 0.7cm oe width x 0.1cm depth; 0.165cm^2 area and 0.016cm^3 volume. There is no tunneling or undermining noted. There is a medium amount of serosanguineous drainage noted. The wound margin is distinct with the  outline attached to the wound base. There is no granulation within the wound bed. There is a large (67- 100%) amount of necrotic tissue within the wound bed including Adherent Slough. Wound #7  status is Open. Original cause of wound was Gradually Appeared. The wound is located on the Right,Lateral Foot. The wound measures 1.5cm length x 1.2cm width x 0.1cm depth; 1.414cm^2 area and 0.141cm^3 volume. There is Fat Layer (Subcutaneous Tissue) exposed. There is no tunneling or undermining noted. There is a medium amount of serosanguineous drainage noted. The wound margin is flat and intact. There is large (67-100%) red granulation within the wound bed. There is a small (1-33%) amount of necrotic tissue within the wound bed including Adherent Slough. 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 There was a Excisional Skin/Subcutaneous Tissue Debridement with a total area of 0.21 sq cm performed by Ricard Dillon., MD. With the following instrument(s): Curette to remove Viable and Non-Viable tissue/material. Material removed includes Subcutaneous Tissue and Slough and after achieving pain control using Lidocaine 4% Topical Solution. No specimens were taken. A time out was conducted at 11:40, prior to the start of the procedure. A Minimum amount of bleeding was controlled with Pressure. The procedure was tolerated well with a pain level of 0 throughout and a pain level of 0 following the procedure. Post Debridement Measurements: 0.3cm length x 0.7cm width x 0.1cm depth; 0.016cm^3 volume. Character of Wound/Ulcer Post Debridement is improved. Severity of Tissue Post Debridement is: Fat layer exposed. Post procedure Diagnosis Wound  #17: Same as Pre-Procedure Wound #7 Pre-procedure diagnosis of Wound #7 is a Diabetic Wound/Ulcer of the Lower Extremity located on the Right,Lateral Foot .Severity of Tissue Pre Debridement is: Fat layer exposed. There was a Excisional Skin/Subcutaneous Tissue Debridement with a total area of 1.8 sq cm performed by Ricard Dillon., MD. With the following instrument(s): Curette to remove Viable and Non-Viable tissue/material. Material removed includes Subcutaneous Tissue and Slough and after achieving pain control using Lidocaine 4% Topical Solution. No specimens were taken. A time out was conducted at 11:40, prior to the start of the procedure. A Minimum amount of bleeding was controlled with Pressure. The procedure was tolerated well with a pain level of 0 throughout and a pain level of 0 following the procedure. Post Debridement Measurements: 1.5cm length x 1.2cm width x 0.1cm depth; 0.141cm^3 volume. Character of Wound/Ulcer Post Debridement is improved. Severity of Tissue Post Debridement is: Fat layer exposed. Post procedure Diagnosis Wound #7: Same as Pre-Procedure Plan Follow-up Appointments: Return Appointment in 2 weeks. Dressing Change Frequency: Change dressing three times week. - HH to change Monday and Thursday Skin Barriers/Peri-Wound Care: Moisturizing lotion - both legs Wound Cleansing: Clean wound with Normal Saline. - or wound cleanser Primary Wound Dressing: Wound #17 Right T Fourth: oe Hydrofera Blue - Classic Wound #7 Right,Lateral Foot: Hydrofera Blue - Classic Secondary Dressing: Wound #7 Right,Lateral Foot: Dry Gauze Wound #17 Right T Fourth: oe Kerlix/Rolled Gauze Dry Gauze Edema Control: 4 layer compression - Right Lower Extremity - unna boot to top to anchor Avoid standing for long periods  of time Elevate legs to the level of the heart or above for 30 minutes daily and/or when sitting, a frequency of: - throughout the day Exercise regularly Support  Garment 30-40 mm/Hg pressure to: - Juxtalite to the Left Leg daily Home Health: [facility] for wound care. - [facility] 1. Hydrofera Blue to continued to both areas the right lateral foot and the right fourth toe 2. We are wrapping the right leg in 4 layer compression he has severe chronic venous insufficiency with stasis dermatitis and lymphedema 3. Graduated to his juxta lite stocking for the left leg today Electronic Signature(s) Signed: 01/19/2020 4:52:36 PM By: Linton Ham MD Entered By: Linton Ham on 01/19/2020 12:18:43 -------------------------------------------------------------------------------- SuperBill Details Patient Name: Date of Service: Jeffery Porter T. 01/19/2020 Medical Record Number: [id number] Patient Account Number: [id number] Date of Birth/Sex: Treating RN: March 08, 1961 (59 y.o. [name] Primary Care Provider: [name] Other Clinician: Referring Provider: Treating Provider/Extender: [name] Weeks in Treatment: 12 Diagnosis Coding ICD-10 Codes Code Description E11.621 Type 2 diabetes mellitus with foot ulcer T81.31XA Disruption of external operation (surgical) wound, not elsewhere classified, initial encounter L97.811 Non-pressure chronic ulcer of other part of right lower leg limited to breakdown of skin L97.821 Non-pressure chronic ulcer of other part of left lower leg limited to breakdown of skin S90.812D Abrasion, left foot, subsequent encounter 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 Facility Procedures CPT4 Code: [id number] Description: [facility] - DEB SUBQ TISSUE 20 SQ CM/< ICD-10 Diagnosis Description L97.512 Non-pressure chronic ulcer of other part of right foot with fat layer exposed Modifier: Quantity: 1 Physician Procedures : CPT4 Code Description Modifier  [id number] - WC PHYS SUBQ TISS 20 SQ CM ICD-10 Diagnosis Description L97.512 Non-pressure chronic ulcer of other part of right foot with fat layer exposed Quantity: 1 Electronic Signature(s) Signed: 01/19/2020 4:52:36 PM By: Linton Ham MD Entered By: Linton Ham on 01/19/2020 12:19:05

## 2020-01-23 ENCOUNTER — Encounter: Payer: Self-pay | Admitting: Family Medicine

## 2020-01-23 ENCOUNTER — Other Ambulatory Visit: Payer: Self-pay

## 2020-01-23 ENCOUNTER — Ambulatory Visit: Payer: Medicaid Other | Attending: Family Medicine | Admitting: Family Medicine

## 2020-01-23 VITALS — BP 157/81 | HR 79 | Ht 74.0 in | Wt 343.0 lb

## 2020-01-23 DIAGNOSIS — E1142 Type 2 diabetes mellitus with diabetic polyneuropathy: Secondary | ICD-10-CM | POA: Diagnosis not present

## 2020-01-23 DIAGNOSIS — E1169 Type 2 diabetes mellitus with other specified complication: Secondary | ICD-10-CM

## 2020-01-23 DIAGNOSIS — G894 Chronic pain syndrome: Secondary | ICD-10-CM | POA: Diagnosis not present

## 2020-01-23 DIAGNOSIS — M5412 Radiculopathy, cervical region: Secondary | ICD-10-CM | POA: Diagnosis not present

## 2020-01-23 DIAGNOSIS — E1159 Type 2 diabetes mellitus with other circulatory complications: Secondary | ICD-10-CM | POA: Diagnosis not present

## 2020-01-23 DIAGNOSIS — N184 Chronic kidney disease, stage 4 (severe): Secondary | ICD-10-CM | POA: Diagnosis not present

## 2020-01-23 DIAGNOSIS — E785 Hyperlipidemia, unspecified: Secondary | ICD-10-CM | POA: Diagnosis not present

## 2020-01-23 DIAGNOSIS — M48061 Spinal stenosis, lumbar region without neurogenic claudication: Secondary | ICD-10-CM | POA: Diagnosis not present

## 2020-01-23 DIAGNOSIS — Z794 Long term (current) use of insulin: Secondary | ICD-10-CM

## 2020-01-23 DIAGNOSIS — M25552 Pain in left hip: Secondary | ICD-10-CM | POA: Diagnosis not present

## 2020-01-23 DIAGNOSIS — E1122 Type 2 diabetes mellitus with diabetic chronic kidney disease: Secondary | ICD-10-CM | POA: Diagnosis not present

## 2020-01-23 DIAGNOSIS — E1143 Type 2 diabetes mellitus with diabetic autonomic (poly)neuropathy: Secondary | ICD-10-CM | POA: Diagnosis not present

## 2020-01-23 DIAGNOSIS — M542 Cervicalgia: Secondary | ICD-10-CM

## 2020-01-23 DIAGNOSIS — M25571 Pain in right ankle and joints of right foot: Secondary | ICD-10-CM | POA: Diagnosis not present

## 2020-01-23 DIAGNOSIS — M25512 Pain in left shoulder: Secondary | ICD-10-CM | POA: Diagnosis not present

## 2020-01-23 DIAGNOSIS — I129 Hypertensive chronic kidney disease with stage 1 through stage 4 chronic kidney disease, or unspecified chronic kidney disease: Secondary | ICD-10-CM | POA: Diagnosis not present

## 2020-01-23 DIAGNOSIS — M545 Low back pain, unspecified: Secondary | ICD-10-CM | POA: Diagnosis not present

## 2020-01-23 DIAGNOSIS — Z6841 Body Mass Index (BMI) 40.0 and over, adult: Secondary | ICD-10-CM | POA: Diagnosis not present

## 2020-01-23 DIAGNOSIS — M25511 Pain in right shoulder: Secondary | ICD-10-CM | POA: Diagnosis not present

## 2020-01-23 LAB — GLUCOSE, POCT (MANUAL RESULT ENTRY): POC Glucose: 224 mg/dl — AB (ref 70–99)

## 2020-01-23 LAB — POCT GLYCOSYLATED HEMOGLOBIN (HGB A1C): HbA1c, POC (controlled diabetic range): 6.4 % (ref 0.0–7.0)

## 2020-01-23 MED ORDER — LANTUS SOLOSTAR 100 UNIT/ML ~~LOC~~ SOPN: 48.0000 [IU] | PEN_INJECTOR | Freq: Two times a day (BID) | SUBCUTANEOUS | 6 refills | Status: AC

## 2020-01-23 MED ORDER — AMLODIPINE BESYLATE 10 MG PO TABS: 10.0000 mg | ORAL_TABLET | Freq: Every day | ORAL | 1 refills | Status: DC

## 2020-01-23 MED ORDER — ATORVASTATIN CALCIUM 20 MG PO TABS: 20.0000 mg | ORAL_TABLET | Freq: Every day | ORAL | 1 refills | Status: AC

## 2020-01-23 MED ORDER — VICTOZA 18 MG/3ML ~~LOC~~ SOPN: 1.8000 mg | PEN_INJECTOR | Freq: Every day | SUBCUTANEOUS | 6 refills | Status: AC

## 2020-01-23 MED ORDER — GABAPENTIN 300 MG PO CAPS: 300.0000 mg | ORAL_CAPSULE | Freq: Two times a day (BID) | ORAL | 1 refills | Status: AC

## 2020-01-23 NOTE — Progress Notes (Signed)
Subjective:  Patient ID: Jeffery [name], male    DOB: 08-30-1960  Age: 59 y.o. MRN: [id number]  CC: Diabetes   HPI Jeffery [name]  is a 59 year old male with history of type 2 diabetes mellitus (A1c6.4), hypertension, Charcot foot due to diabetes mellitus, gout, hypothyroidism, A. fib(status post DCCV in 05/2019 with conversion to sinus rhythm), status post right rotator cuff repair who presents today for follow-up visit  He is currently followed by wound care for management of his R leg cellulitis His balance is still off and his back hurts from his spinal stenosis. This got exacerbated after he was hospitalized due to a metal bar in the bed which rubbed along his spine. He is on Percocet which he receives from his pain management clinicians. His neck hurts and hurts to turn from side to side.  This started a week ago but he feels it is because he slept wrong.  Denies periods of hypoglycemia. Doing 50 units of Lantus bid rather than 40 units twice daily which appears on his med list.  He does his NovoLog on a sliding scale and uses Victoza a couple of days a week; due to the GI symptoms he does not use it daily.  He is currently being followed by nephrology and is currently on iron replacement therapy.  He denies chest pain, dyspnea and his pedal edema has improved. Doing well on his levothyroxine for hypothyroidism. Currently on a renal dose of Eliquis Past Medical History:  Diagnosis Date  . Atrial fibrillation ([place])   . Cellulitis and abscess of foot 07/2019   RIGHT FOOT  . Charcot's joint of right foot   . DDD (degenerative disc disease), lumbar   . Diabetes mellitus without complication ([place])   . Fatty liver   . Hypertension   . Obesity   . Rotator cuff disorder   . Shoulder impingement, right   . Spinal stenosis at L4-L5 level     Past Surgical History:  Procedure Laterality Date  . AMPUTATION Left 10/02/2014   Procedure: Left Third toe amputation ;  Surgeon:  [name], MD;  Location: [facility];  Service: Orthopedics;  Laterality: Left;  Regular bed, wants to follow hip  . ANTERIOR CRUCIATE LIGAMENT REPAIR Right 90   reconstruction  . APPLICATION OF WOUND VAC Left 10/02/2014   Procedure: APPLICATION OF WOUND VAC; toe Surgeon: [name], MD;  Location: [facility];  Service: Orthopedics;  Laterality: Left;  . CARDIOVERSION N/A 04/26/2019   Procedure: CARDIOVERSION;  Surgeon: Pixie Casino, MD;  Location: [facility] ENDOSCOPY;  Service: Cardiovascular;  Laterality: N/A;  . CARDIOVERSION N/A 05/18/2019   Procedure: CARDIOVERSION (CATH LAB);  Surgeon: Constance Haw, MD;  Location: [facility] CV LAB;  Service: Cardiovascular;  Laterality: N/A;  . I & D EXTREMITY Left 10/05/2014   Procedure: IRRIGATION AND DEBRIDEMENT LEFT FOOT;  Surgeon: [name], MD;  Location: [facility];  Service: Orthopedics;  Laterality: Left;  . INCISION AND DRAINAGE Right 08/09/2019   Procedure: INCISION AND DRAINAGE RIGHT FOOT;  Surgeon: Trula Slade, DPM;  Location: [facility];  Service: Podiatry;  Laterality: Right;  . KNEE ARTHROSCOPY W/ ACL RECONSTRUCTION Right   . TOTAL KNEE ARTHROPLASTY Right 03/28/2015  . TOTAL KNEE ARTHROPLASTY Right 03/28/2015   Procedure: RIGHT TOTAL KNEE ARTHROPLASTY;  Surgeon: [name], MD;  Location: [facility];  Service: Orthopedics;  Laterality: Right;  . WOUND DEBRIDEMENT Right 08/14/2019   Procedure: DEBRIDEMENT WOUND;  Surgeon: Jacqualyn Posey,  Bonna Gains, DPM;  Location: [facility];  Service: Podiatry;  Laterality: Right;    Family History  Problem Relation Age of Onset  . Diabetes Father   . Hypertension Father   . Heart failure Father     Allergies  Allergen Reactions  . Metformin And Related Other (See Comments)    GI upset    Outpatient Medications Prior to Visit  Medication Sig Dispense Refill  . Accu-Chek FastClix Lancets MISC Use as directed to check blood sugar at least twice daily. E11.8 E11.65 Z79.4 (Patient taking differently: 1 each by Other  route See admin instructions. Use as directed to check blood sugar at least twice daily. E11.8 E11.65 Z79.4) 102 each 11  . ACCU-CHEK GUIDE test strip USE AS DIRECTED AT LEAST TWO TIMES A DAY 100 strip 11  . allopurinol (ZYLOPRIM) 100 MG tablet Take 0.5 tablets (50 mg total) by mouth daily. 15 tablet 6  . amiodarone (PACERONE) 200 MG tablet Take 200 mg by mouth 2 (two) times daily.     Marland Kitchen apixaban (ELIQUIS) 2.5 MG TABS tablet Take 1 tablet (2.5 mg total) by mouth 2 (two) times daily. 60 tablet   . collagenase (SANTYL) ointment Apply 1 application topically daily. 15 g 0  . furosemide (LASIX) 80 MG tablet Take 1 tablet (80 mg total) by mouth 2 (two) times daily. 60 tablet 1  . hydrALAZINE (APRESOLINE) 50 MG tablet Take 1 tablet (50 mg total) by mouth every 8 (eight) hours. 90 tablet 1  . insulin lispro (INSULIN LISPRO) 100 UNIT/ML KwikPen Junior Inject 0.08 mLs (8 Units total) into the skin 3 (three) times daily. (Patient taking differently: Inject 8 Units into the skin 3 (three) times daily as needed (Low blood sugar). ) 30 mL 6  . Insulin Pen Needle (B-D ULTRAFINE III SHORT PEN) 31G X 8 MM MISC 1 each by Does not apply route 3 (three) times daily. 100 each 5  . Insulin Syringe-Needle U-100 (TRUEPLUS INSULIN SYRINGE) 30G X 5/16" 0.5 ML MISC Use as directed 3 times daily (Patient taking differently: 1 each by Other route See admin instructions. Use as directed 3 times daily) 100 each 5  . levothyroxine (SYNTHROID) 75 MCG tablet Take 1 tablet (75 mcg total) by mouth daily. 90 tablet 3  . Misc. Devices MISC Extra-large bedside commode. Dx left leg cellulitis 1 each 0  . oxyCODONE-acetaminophen (PERCOCET) 10-325 MG tablet Take 1 tablet by mouth every 4 (four) hours as needed for pain.     . polyethylene glycol (MIRALAX / GLYCOLAX) 17 g packet Take 17 g by mouth daily as needed for mild constipation. 14 each 0  . Sennosides (EX-LAX) 15 MG TABS Take 15 mg by mouth daily as needed (Constipation).    . sodium  chloride (OCEAN) 0.65 % SOLN nasal spray Place 1 spray into both nostrils daily as needed for congestion.     Karen Chafe INSULIN SYRINGE 30G X 5/16" 0.5 ML MISC USE AS DIRECTED 3 TIMES DAILY (Patient taking differently: 1 each by Other route in the morning, at noon, and at bedtime. ) 100 each 0  . amLODipine (NORVASC) 10 MG tablet Take 1 tablet (10 mg total) by mouth daily. 30 tablet 1  . atorvastatin (LIPITOR) 20 MG tablet Take 1 tablet (20 mg total) by mouth daily. 90 tablet 1  . gabapentin (NEURONTIN) 300 MG capsule TAKE ONE CAPSULE BY MOUTH TWICE A DAY 180 capsule 0  . insulin glargine (LANTUS SOLOSTAR) 100 UNIT/ML Solostar Pen Inject  40 Units into the skin 2 (two) times daily. 30 mL 6  . liraglutide (VICTOZA) 18 MG/3ML SOPN Inject 0.3 mLs (1.8 mg total) into the skin daily. 30 mL 6  . predniSONE (DELTASONE) 20 MG tablet Take 1 tablet (20 mg total) by mouth 2 (two) times daily with a meal. (Patient not taking: Reported on 01/23/2020) 10 tablet 0   No facility-administered medications prior to visit.     ROS Review of Systems  Constitutional: Negative for activity change and appetite change.  HENT: Negative for sinus pressure and sore throat.   Eyes: Negative for visual disturbance.  Respiratory: Negative for cough, chest tightness and shortness of breath.   Cardiovascular: Negative for chest pain and leg swelling.  Gastrointestinal: Negative for abdominal distention, abdominal pain, constipation and diarrhea.  Endocrine: Negative.   Genitourinary: Negative for dysuria.  Musculoskeletal:       See HPI  Skin: Negative for rash.  Allergic/Immunologic: Negative.   Neurological: Negative for weakness, light-headedness and numbness.  Psychiatric/Behavioral: Negative for dysphoric mood and suicidal ideas.    Objective:  BP (!) 157/81   Pulse 79   Ht 6\' 2"  (1.88 m)   Wt (!) 343 lb (155.6 kg)   SpO2 97%   BMI 44.04 kg/m   BP/Weight 01/23/2020 01/19/2020 13/0/8657  Systolic BP 846  962 952  Diastolic BP 81 71 81  Wt. (Lbs) 343 - -  BMI 44.04 - -      Physical Exam Constitutional:      Appearance: He is well-developed.  Neck:     Vascular: No JVD.     Comments: Reduced ROM Cardiovascular:     Rate and Rhythm: Normal rate.     Heart sounds: Normal heart sounds. No murmur heard.   Pulmonary:     Effort: Pulmonary effort is normal.     Breath sounds: Normal breath sounds. No wheezing or rales.  Chest:     Chest wall: No tenderness.  Abdominal:     General: Bowel sounds are normal. There is no distension.     Palpations: Abdomen is soft. There is no mass.     Tenderness: There is no abdominal tenderness.  Musculoskeletal:     Cervical back: Tenderness present.     Right lower leg: No edema.     Left lower leg: No edema.     Comments: Slight lumbar spine tenderness Legs wrapped with Unna boot  Neurological:     Mental Status: He is alert and oriented to person, place, and time.  Psychiatric:        Mood and Affect: Mood normal.     CMP Latest Ref Rng & Units 01/05/2020 01/05/2020 12/08/2019  Glucose 70 - 99 mg/dL 103(H) - 170(H)  BUN 6 - 20 mg/dL 45(H) - 36(H)  Creatinine 0.61 - 1.24 mg/dL 2.67(H) - 2.64(H)  Sodium 135 - 145 mmol/L 139 - 139  Potassium 3.5 - 5.1 mmol/L 4.1 - 4.4  Chloride 98 - 111 mmol/L 105 - 106  CO2 22 - 32 mmol/L 23 - 22  Calcium 8.7 - 10.2 mg/dL 9.6 9.4 9.2  Total Protein 6.5 - 8.1 g/dL - - -  Total Bilirubin 0.3 - 1.2 mg/dL - - -  Alkaline Phos 38 - 126 U/L - - -  AST 15 - 41 U/L - - -  ALT 0 - 44 U/L - - -    Lipid Panel     Component Value Date/Time   CHOL 157 04/04/2018 1103  TRIG 256 (H) 04/04/2018 1103   HDL 42 04/04/2018 1103   CHOLHDL 3.7 04/04/2018 1103   CHOLHDL 3.8 04/10/2010 0315   VLDL 32 04/10/2010 0315   LDLCALC 64 04/04/2018 1103    CBC    Component Value Date/Time   WBC 10.7 (H) 10/12/2019 1735   RBC 3.07 (L) 10/12/2019 1735   HGB 11.8 (L) 01/19/2020 0922   HGB 10.4 (L) 08/25/2019 0902    HCT 25.7 (L) 10/12/2019 1735   HCT 32.2 (L) 08/25/2019 0902   PLT 443 (H) 10/12/2019 1735   PLT 377 08/25/2019 0902   MCV 83.7 10/12/2019 1735   MCV 88 08/25/2019 0902   MCH 25.4 (L) 10/12/2019 1735   MCHC 30.4 10/12/2019 1735   RDW 17.8 (H) 10/12/2019 1735   RDW 14.8 08/25/2019 0902   LYMPHSABS 1.1 10/12/2019 1735   LYMPHSABS 2.2 03/28/2019 1352   MONOABS 1.0 10/12/2019 1735   EOSABS 0.3 10/12/2019 1735   EOSABS 0.4 03/28/2019 1352   BASOSABS 0.0 10/12/2019 1735   BASOSABS 0.1 03/28/2019 1352    Lab Results  Component Value Date   HGBA1C 6.4 01/23/2020    Assessment & Plan:  1. Type 2 diabetes mellitus with other circulatory complication, with long-term current use of insulin (HCC) Controlled with A1c of 6.4 Counseled on management of hypoglycemia He will reduce Lantus from 50 units twice daily to 48 units twice daily Use NovoLog lispro sliding scale, continue Victoza Counseled on Diabetic diet, my plate method, 062 minutes of moderate intensity exercise/week Blood sugar logs with fasting goals of 80-120 mg/dl, random of less than 180 and in the event of sugars less than 60 mg/dl or greater than 400 mg/dl encouraged to notify the clinic. Advised on the need for annual eye exams, annual foot exams, Pneumonia vaccine. - POCT glucose (manual entry) - POCT glycosylated hemoglobin (Hb A1C) - insulin glargine (LANTUS SOLOSTAR) 100 UNIT/ML Solostar Pen; Inject 48 Units into the skin 2 (two) times daily.  Dispense: 30 mL; Refill: 6  2. Neck pain Likely muscle spasm Advised to apply heat  3. Diabetic polyneuropathy associated with type 2 diabetes mellitus (HCC) Stable - liraglutide (VICTOZA) 18 MG/3ML SOPN; Inject 1.8 mg into the skin daily.  Dispense: 30 mL; Refill: 6  4. Hyperlipidemia associated with type 2 diabetes mellitus (HCC) Controlled Low-cholesterol diet - atorvastatin (LIPITOR) 20 MG tablet; Take 1 tablet (20 mg total) by mouth daily.  Dispense: 90 tablet; Refill:  1  5. 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 Discussed medication compliance, adverse effects. - amLODipine (NORVASC) 10 MG tablet; Take 1 tablet (10 mg total) by mouth daily.  Dispense: 90 tablet; Refill: 1  6. Spinal stenosis at L4-L5 level Uncontrolled with recent exacerbation Currently on Percocet from pain clinic Advised to apply heat Continue gabapentin - gabapentin (NEURONTIN) 300 MG capsule; Take 1 capsule (300 mg total) by mouth 2 (two) times daily.  Dispense: 180 capsule; Refill: 1   Meds ordered this encounter  Medications  . insulin glargine (LANTUS SOLOSTAR) 100 UNIT/ML Solostar Pen    Sig: Inject 48 Units into the skin 2 (two) times daily.    Dispense:  30 mL    Refill:  6    Dose decrease  . liraglutide (VICTOZA)  18 MG/3ML SOPN    Sig: Inject 1.8 mg into the skin daily.    Dispense:  30 mL    Refill:  6  . gabapentin (NEURONTIN) 300 MG capsule    Sig: Take 1 capsule (300 mg total) by mouth 2 (two) times daily.    Dispense:  180 capsule    Refill:  1  . atorvastatin (LIPITOR) 20 MG tablet    Sig: Take 1 tablet (20 mg total) by mouth daily.    Dispense:  90 tablet    Refill:  1  . amLODipine (NORVASC) 10 MG tablet    Sig: Take 1 tablet (10 mg total) by mouth daily.    Dispense:  90 tablet    Refill:  1    Follow-up: Return in about 3 months (around 04/24/2020) for Chronic disease management.       [name], MD, FAAFP. [facility] [place], [place]   01/23/2020, 5:44 PM

## 2020-01-23 NOTE — Progress Notes (Signed)
Having pain in neck and back.

## 2020-01-23 NOTE — Patient Instructions (Signed)

## 2020-01-31 ENCOUNTER — Telehealth: Payer: Self-pay | Admitting: Pharmacist

## 2020-01-31 NOTE — Telephone Encounter (Signed)
Pt's pharmacy called to report that the patient is taking two Lipitor 20mg  tabs daily instead of the one prescribed.   I called the patient. Informed him that per his chart, Dr. Margarita Rana has him on one 20 mg tablet daily. He verbalized understanding and will take only one 20 mg tablet daily. Will route to PCP.

## 2020-02-02 ENCOUNTER — Encounter (HOSPITAL_COMMUNITY)
Admission: RE | Admit: 2020-02-02 | Discharge: 2020-02-02 | Disposition: A | Discharge status: Home / Self Care | Payer: Medicaid Other | Source: Ambulatory Visit | Attending: Nephrology | Admitting: Nephrology

## 2020-02-02 ENCOUNTER — Encounter (HOSPITAL_COMMUNITY): Payer: Medicaid Other

## 2020-02-02 ENCOUNTER — Encounter (HOSPITAL_BASED_OUTPATIENT_CLINIC_OR_DEPARTMENT_OTHER): Payer: Medicaid Other | Attending: Internal Medicine | Admitting: Internal Medicine

## 2020-02-02 ENCOUNTER — Other Ambulatory Visit: Payer: Self-pay | Admitting: Family Medicine

## 2020-02-02 ENCOUNTER — Other Ambulatory Visit: Payer: Self-pay

## 2020-02-02 VITALS — BP 147/69 | HR 76 | Temp 97.5°F | Resp 20

## 2020-02-02 DIAGNOSIS — I509 Heart failure, unspecified: Secondary | ICD-10-CM | POA: Diagnosis not present

## 2020-02-02 DIAGNOSIS — N189 Chronic kidney disease, unspecified: Secondary | ICD-10-CM | POA: Insufficient documentation

## 2020-02-02 DIAGNOSIS — D631 Anemia in chronic kidney disease: Secondary | ICD-10-CM | POA: Insufficient documentation

## 2020-02-02 DIAGNOSIS — E1122 Type 2 diabetes mellitus with diabetic chronic kidney disease: Secondary | ICD-10-CM | POA: Insufficient documentation

## 2020-02-02 DIAGNOSIS — N183 Chronic kidney disease, stage 3 unspecified: Secondary | ICD-10-CM

## 2020-02-02 DIAGNOSIS — L97218 Non-pressure chronic ulcer of right calf with other specified severity: Secondary | ICD-10-CM | POA: Diagnosis not present

## 2020-02-02 DIAGNOSIS — T8131XA Disruption of external operation (surgical) wound, not elsewhere classified, initial encounter: Secondary | ICD-10-CM | POA: Insufficient documentation

## 2020-02-02 DIAGNOSIS — L97512 Non-pressure chronic ulcer of other part of right foot with fat layer exposed: Secondary | ICD-10-CM | POA: Insufficient documentation

## 2020-02-02 DIAGNOSIS — E11621 Type 2 diabetes mellitus with foot ulcer: Secondary | ICD-10-CM | POA: Diagnosis not present

## 2020-02-02 DIAGNOSIS — I129 Hypertensive chronic kidney disease with stage 1 through stage 4 chronic kidney disease, or unspecified chronic kidney disease: Secondary | ICD-10-CM

## 2020-02-02 DIAGNOSIS — X58XXXA Exposure to other specified factors, initial encounter: Secondary | ICD-10-CM | POA: Diagnosis not present

## 2020-02-02 DIAGNOSIS — E11622 Type 2 diabetes mellitus with other skin ulcer: Secondary | ICD-10-CM | POA: Insufficient documentation

## 2020-02-02 DIAGNOSIS — N184 Chronic kidney disease, stage 4 (severe): Secondary | ICD-10-CM

## 2020-02-02 DIAGNOSIS — I130 Hypertensive heart and chronic kidney disease with heart failure and stage 1 through stage 4 chronic kidney disease, or unspecified chronic kidney disease: Secondary | ICD-10-CM | POA: Diagnosis not present

## 2020-02-02 MED ORDER — EPOETIN ALFA-EPBX 10000 UNIT/ML IJ SOLN: 20000.0000 [IU] | INTRAMUSCULAR | Status: DC

## 2020-02-05 NOTE — Progress Notes (Signed)
Jeffery, [name] ([id number]) . Visit Report for 02/02/2020 Debridement Details Patient Name: Date of Service: Jeffery [name]. 02/02/2020 11:00 A M Medical Record Number: [id number] Patient Account Number: [id number] Date of Birth/Sex: Treating RN: 03/18/1961 (59 y.o. Jeffery [name] Primary Care Provider: [name] Other Clinician: Referring Provider: Treating Provider/Extender: [name] Weeks in Treatment: 14 Debridement Performed for Assessment: Wound #17 Right [name] Fourth oe Performed By: Physician Ricard Dillon., MD Debridement Type: Debridement Severity of Tissue Pre Debridement: Fat layer exposed Level of Consciousness (Pre-procedure): Awake and Alert Pre-procedure Verification/Time Out Yes - 12:05 Taken: Start Time: 12:05 Pain Control: Other : benzocaine 20% spray [name] Area Debrided (L x W): otal 0.3 (cm) x 0.3 (cm) = 0.09 (cm) Tissue and other material debrided: Viable, Non-Viable, Slough, Subcutaneous, Slough Level: Skin/Subcutaneous Tissue Debridement Description: Excisional Instrument: Curette Bleeding: Minimum Hemostasis Achieved: Pressure End Time: 12:07 Procedural Pain: 0 Post Procedural Pain: 0 Response to Treatment: Procedure was tolerated well Level of Consciousness (Post- Awake and Alert procedure): Post Debridement Measurements of Total Wound Length: (cm) 0.3 Width: (cm) 0.3 Depth: (cm) 0.1 Volume: (cm) 0.007 Character of Wound/Ulcer Post Debridement: Improved Severity of Tissue Post Debridement: Fat layer exposed Post Procedure Diagnosis Same as Pre-procedure Electronic Signature(s) Signed: 02/02/2020 6:06:29 PM By: Baruch Gouty RN, BSN Signed: 02/05/2020 1:46:42 PM By: Linton Ham MD Entered By: Linton Ham on 02/02/2020 12:45:09 -------------------------------------------------------------------------------- HPI Details Patient Name: Date of Service: Jeffery [name], Jeffery [name]. 02/02/2020 11:00 A  M Medical Record Number: [id number] Patient Account Number: [id number] Date of Birth/Sex: Treating RN: 1960-07-14 (59 y.o. Jeffery [name] Primary Care Provider: [name] Other Clinician: Referring Provider: Treating Provider/Extender: [name] Weeks in Treatment: 14 History of Present Illness HPI Description: ADMISSION 10/23/2019 This is a complex 59 year old man who is here with wounds on his bilateral lower legs feet and abrasion on his left forearm. These are generally of different etiology. He is a type II diabetic with peripheral neuropathy but does not have known PAD. Looking through [facility] health link I can see he was being followed by Dr. Earleen Newport earlier this year for a diabetic ulcer on the right foot. An MRI done in May showed no osteomyelitis. Shortly thereafter he developed cellulitis of the right leg and foot and was hospitalized from 5/10 through 5/19 with MSSA sepsis. During this hospitalization he was felt to have osteomyelitis he had debridement twice of the right foot and on one occasion apparently a placement of ACell. He again was hospitalized from 6/10 through 7/1 again with left lower extremity cellulitis increasing edema and acute renal failure. An MRI showed left leg cellulitis but no other major findings. 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 He was discharged with multiple wounds on his legs which apparently started as blisters secondary to fluid overload. His creatinine on 7/5 was 7.49 potassium 4 BUN 116 CO2 of 23 albumin of 2.9. He was seen by Dr. Moshe Cipro of nephrology medications were adjusted she is following him. 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 The patient arrives in clinic today with multiple wounds of different etiologies on his bilateral lower extremities. On the right leg he has superficial areas on the right medial and  lateral.. On his right lateral foot I think the original surgical wound. There is a small area at the base of his left second toe. On the left lower  extremity a fairly large wound on the left second toe which was trauma from the shower door. He has several areas on the legs. Which are superficial. Finally he has an abrasion injury on his left arm/skin tear Past medical history includes Charcot foot. Left third toe amputation, type 2 diabetes with peripheral neuropathy, paroxysmal A. fib on Eliquis, diastolic heart failure, acute on chronic renal failure as described. ABI in our clinic was 1.18 on the right 1.19 on the left 11/03/2019 upon evaluation today patient appears to be doing well with regard to his ulcers in general. He is making good progress. The worst is his larger right lateral foot ulcer. This wound is going require some sharp debridement today. Other than that he seems to be doing quite well. 11/10/19-Patient has denuded areas on both legs especially the right where there is more stasis changes and friable skin, areas on his right leg appear to be bigger than before, right lateral foot wound appears about the same. We are using Hydrofera Blue to the foot wound and silver alginate on the rest with 4 layer compression 8/19; patient's wounds on his bilateral anterior lower legs look a lot better. These appear to be progressing towards healing and some of them actually have healed. The most substantial area is on the right lateral foot. I thought this was his original surgical wound site. This has raised hyper granulation tissue and a completely nonviable surface. We have been using silver alginate to all the wounds on his legs under compression and Hydrofera Blue on the right lateral 8/27; most of the wounds are improving here except for the one on the right lateral foot. Still hyper granulation were using Hydrofera Blue in this area.he does not have an arterial issue. The wound on the right  lateral foot I think was an original surgical wound. I wonder whether he inverts at the ankle and not to complicate healing this. In review E does not have an arterial issue.MRI of the foot did not show osteomyelitis in May He still has several open wounds on the bilateral lower legs although these are a lot better. We are using silver alginate under for R compression 9/10; right lateral foot wound which is a surgical wound is still open. He still has bilateral small wounds question venous lymphedema. He developed a new one in the posterior left calf. We have his right leg in compression he has been wearing a compression garment on the left leg. We are going to wrap both legs today. He does not have an arterial issue. MRI did not suggest osteomyelitis of the right foot 9/17; right lateral foot wound still hyper granulated. He has superficial areas on the right anterior lower extremity which are a bit worse this week. There is nothing open on the left leg but the swelling on the left leg is worse than the right this looks like chronic venous insufficiency. We ordered him juxta lite stockings but he still has not received them. He tells me that his creatinine is improving he follows with nephrology. 9/24; right lateral foot. The wound still has a very adherent necrotic surface although I am able to debride this to help something healthy. The area on the right anterior leg in the setting of chronic venous insufficiency and lymphedema is superficial and looks healthy. We will continue with Hydrofera Blue on the foot silver alginate on the leg. We could not get wraparound stockings because he has home health. They are changing the dressing. Home  health will not wrap the left leg because he does not have a wound. 10/8; right lateral foot the wound is smaller. Healthy looking surface no debridement is required. He does have a juxta lite stocking for the left leg there is no open wound here but he does have  chronic lymphedema and chronic venous insufficiency with stasis dermatitis looking changes. We will continue to wrap his right leg 10/22; right lateral foot slightly smaller debris on the surface. Comes in today with a new area on the right fourth toe over the DIP. I am not precisely sure how this happened. It looks like a foot wear injury although he denies this states he never wears shoes at home and he only wears them when he goes out to appointments. He says he took his sock off and noticed blood there. He has a juxta lite stocking for the left leg we have been wrapping the right 11/5; right lateral foot slightly smaller does not have any debris on the surface. The area over the right dorsal fourth toe has a completely nonviable surface. We have been using Hydrofera Blue in both areas he has a juxta lite stocking on the right left leg and were wrapping the right Electronic Signature(s) Signed: 02/05/2020 1:46:42 PM By: Linton Ham MD Entered By: Linton Ham on 02/02/2020 12:46:00 -------------------------------------------------------------------------------- Physical Exam Details Patient Name: Date of Service: Jeffery [name]. 02/02/2020 11:00 A M Medical Record Number: [id number] Patient Account Number: [id number] Date of Birth/Sex: Treating RN: Nov 10, 1960 (59 y.o. Jeffery [name] Primary Care Provider: [name] Other Clinician: Referring Provider: Treating Provider/Extender: [name] Weeks in Treatment: 14 Constitutional Patient is hypertensive.. Pulse regular and within target range for patient.Marland Kitchen Respirations regular, non-labored and within target range.. Temperature is normal and within the target range for the patient.Marland Kitchen Appears in no distress. Notes Wound exam; right lateral foot which was initially abrasion. Did not require debridements measuring smaller The area on the dorsal fourth toe did require debridement with a #3 curette I  removed eschar skin and subcutaneous tissue from the wound bed. This has minimal amount of depth but it is somewhat deep relative to the circumference there is no palpable deep structures Electronic Signature(s) Signed: 02/05/2020 1:46:42 PM By: Linton Ham MD Entered By: Linton Ham on 02/02/2020 12:47:29 -------------------------------------------------------------------------------- Physician Orders Details Patient Name: Date of Service: Jeffery [name]. 02/02/2020 11:00 A M Medical Record Number: [id number] Patient Account Number: [id number] Date of Birth/Sex: Treating RN: Dec 21, 1960 (59 y.o. Jeffery [name] Primary Care Provider: [name] Other Clinician: Referring Provider: Treating Provider/Extender: [name] in Treatment: 14 Verbal / Phone Orders: No Diagnosis Coding ICD-10 Coding Code Description E11.621 Type 2 diabetes mellitus with foot ulcer T81.31XA Disruption of external operation (surgical) wound, not elsewhere classified, initial encounter L97.512 Non-pressure chronic ulcer of other part of right foot with fat layer exposed E11.22 Type 2 diabetes mellitus with diabetic chronic kidney disease Follow-up Appointments ppointment in 2 weeks. - Thursday Return A Dressing Change Frequency Change dressing three times week. - HH to change Monday and Thursday Skin Barriers/Peri-Wound Care Moisturizing lotion - both legs Wound Cleansing Clean wound with Normal Saline. - or wound cleanser Primary Wound Dressing Wound #17 Right [name] Fourth oe Hydrofera Blue - Classic Wound #7 Right,Lateral Foot Hydrofera Blue - Classic Secondary Dressing Wound #17 Right [name] Fourth oe Kerlix/Rolled Gauze Dry Gauze Wound #7 Right,Lateral Foot Dry Gauze Edema Control 4 layer compression - Right Lower  Extremity Avoid standing for long periods of time Elevate legs to the level of the heart or above for 30 minutes daily and/or when sitting, a  frequency of: - throughout the day Exercise regularly Support Garment 30-40 mm/Hg pressure to: - Juxtalite to the Left Leg daily [place] [facility] skilled nursing for wound care. - [facility] Electronic Signature(s) Signed: 02/02/2020 6:06:29 PM By: Baruch Gouty RN, BSN Signed: 02/05/2020 1:46:42 PM By: Linton Ham MD Entered By: Baruch Gouty on 02/02/2020 12:32:12 -------------------------------------------------------------------------------- Problem List Details Patient Name: Date of Service: Jeffery [name]. 02/02/2020 11:00 A M Medical Record Number: [id number] Patient Account Number: [id number] Date of Birth/Sex: Treating RN: 1960/11/05 (59 y.o. Jeffery [name] Primary Care Provider: [name] Other Clinician: Referring Provider: Treating Provider/Extender: [name] Weeks in Treatment: 14 Active Problems ICD-10 Encounter Code Description Active Date MDM Diagnosis E11.621 Type 2 diabetes mellitus with foot ulcer 10/23/2019 No Yes T81.31XA Disruption of external operation (surgical) wound, not elsewhere classified, 10/23/2019 No Yes initial encounter L97.512 Non-pressure chronic ulcer of other part of right foot with fat layer exposed 11/03/2019 No Yes E11.22 Type 2 diabetes mellitus with diabetic chronic kidney disease 10/23/2019 No Yes Inactive Problems ICD-10 Code Description Active Date Inactive Date L97.811 Non-pressure chronic ulcer of other part of right lower leg limited to breakdown of skin 10/23/2019 10/23/2019 L97.821 Non-pressure chronic ulcer of other part of left lower leg limited to breakdown of skin 10/23/2019 10/23/2019 S90.812D Abrasion, left foot, subsequent encounter 10/23/2019 10/23/2019 S50.812D Abrasion of left forearm, subsequent encounter 10/23/2019 10/23/2019 Resolved Problems Electronic Signature(s) Signed: 02/05/2020 1:46:42 PM By: Linton Ham MD Entered By: Linton Ham on 02/02/2020  12:43:37 -------------------------------------------------------------------------------- Progress Note Details Patient Name: Date of Service: Jeffery [name]. 02/02/2020 11:00 A M Medical Record Number: [id number] Patient Account Number: [id number] Date of Birth/Sex: Treating RN: 1960-06-29 (59 y.o. Jeffery [name] Primary Care Provider: [name] Other Clinician: Referring Provider: Treating Provider/Extender: [name] Weeks in Treatment: 14 Subjective History of Present Illness (HPI) ADMISSION 10/23/2019 This is a complex 59 year old man who is here with wounds on his bilateral lower legs feet and abrasion on his left forearm. These are generally of different etiology. He is a type II diabetic with peripheral neuropathy but does not have known PAD. Looking through [facility] health link I can see he was being followed by Dr. Earleen Newport earlier this year for a diabetic ulcer on the right foot. An MRI done in May showed no osteomyelitis. Shortly thereafter he developed cellulitis of the right leg and foot and was hospitalized from 5/10 through 5/19 with MSSA sepsis. During this hospitalization he was felt to have osteomyelitis he had debridement twice of the right foot and on one occasion apparently a placement of ACell. He again was hospitalized from 6/10 through 7/1 again with left lower extremity cellulitis increasing edema and acute renal failure. An MRI showed left leg cellulitis but no other major findings. 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 He was discharged with multiple wounds on his legs which apparently started as blisters secondary to fluid overload. His creatinine on 7/5 was 7.49 potassium 4 BUN 116 CO2 of 23 albumin of 2.9. He was seen by Dr. Moshe Cipro of nephrology medications were adjusted she is following him. 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 The patient arrives in clinic today with multiple wounds of  different etiologies on his bilateral lower extremities. ooOn the right leg he has superficial areas on the right medial and lateral.. On his right lateral foot I think the original surgical wound. There is a small area at the base of his left second toe. ooOn the left lower extremity a fairly large wound on the left second toe which was trauma from the shower door. He has several areas on the legs. Which are superficial. ooFinally he has an abrasion injury on his left arm/skin tear Past medical history includes Charcot foot. Left third toe amputation, type 2 diabetes with peripheral neuropathy, paroxysmal A. fib on Eliquis, diastolic heart failure, acute on chronic renal failure as described. ABI in our clinic was 1.18 on the right 1.19 on the left 11/03/2019 upon evaluation today patient appears to be doing well with regard to his ulcers in general. He is making good progress. The worst is his larger right lateral foot ulcer. This wound is going require some sharp debridement today. Other than that he seems to be doing quite well. 11/10/19-Patient has denuded areas on both legs especially the right where there is more stasis changes and friable skin, areas on his right leg appear to be bigger than before, right lateral foot wound appears about the same. We are using Hydrofera Blue to the foot wound and silver alginate on the rest with 4 layer compression 8/19; patient's wounds on his bilateral anterior lower legs look a lot better. These appear to be progressing towards healing and some of them actually have healed. The most substantial area is on the right lateral foot. I thought this was his original surgical wound site. This has raised hyper granulation tissue and a completely nonviable surface. We have been using silver alginate to all the wounds on his legs under compression and Hydrofera Blue on the right lateral 8/27;  most of the wounds are improving here except for the one on the right lateral foot. Still hyper granulation were using Hydrofera Blue in this area.he does not have an arterial issue. The wound on the right lateral foot I think was an original surgical wound. I wonder whether he inverts at the ankle and not to complicate healing this. In review E does not have an arterial issue.MRI of the foot did not show osteomyelitis in May He still has several open wounds on the bilateral lower legs although these are a lot better. We are using silver alginate under for R compression 9/10; right lateral foot wound which is a surgical wound is still open. He still has bilateral small wounds question venous lymphedema. He developed a new one in the posterior left calf. We have his right leg in compression he has been wearing a compression garment on the left leg. We are going to wrap both legs today. He does not have an arterial issue. MRI did not suggest osteomyelitis of the right foot 9/17; right lateral foot wound still hyper granulated. He has superficial areas on the right anterior lower extremity which are a bit worse this week. There is nothing open on the left leg but the swelling on the left leg is worse than the right this looks like chronic venous insufficiency. We ordered him juxta lite stockings but he still has not received them. He tells me that his creatinine is improving he follows with nephrology. 9/24; right lateral foot. The wound still has a very adherent necrotic surface although I am able to debride this to help something healthy. The area on  the right anterior leg in the setting of chronic venous insufficiency and lymphedema is superficial and looks healthy. We will continue with Hydrofera Blue on the foot silver alginate on the leg. We could not get wraparound stockings because he has home health. They are changing the dressing. Home health will not wrap the left leg because he does not have  a wound. 10/8; right lateral foot the wound is smaller. Healthy looking surface no debridement is required. He does have a juxta lite stocking for the left leg there is no open wound here but he does have chronic lymphedema and chronic venous insufficiency with stasis dermatitis looking changes. We will continue to wrap his right leg 10/22; right lateral foot slightly smaller debris on the surface. Comes in today with a new area on the right fourth toe over the DIP. I am not precisely sure how this happened. It looks like a foot wear injury although he denies this states he never wears shoes at home and he only wears them when he goes out to appointments. He says he took his sock off and noticed blood there. He has a juxta lite stocking for the left leg we have been wrapping the right 11/5; right lateral foot slightly smaller does not have any debris on the surface. The area over the right dorsal fourth toe has a completely nonviable surface. We have been using Hydrofera Blue in both areas he has a juxta lite stocking on the right left leg and were wrapping the right Objective Constitutional Patient is hypertensive.. Pulse regular and within target range for patient.Marland Kitchen Respirations regular, non-labored and within target range.. Temperature is normal and within the target range for the patient.Marland Kitchen Appears in no distress. Vitals Time Taken: 11:23 AM, Height: 74 in, Weight: 345 lbs, BMI: 44.3, Temperature: 97.7 F, Pulse: 81 bpm, Respiratory Rate: 20 breaths/min, Blood Pressure: 147/76 mmHg. General Notes: Wound exam; right lateral foot which was initially abrasion. Did not require debridements measuring smaller ooThe area on the dorsal fourth toe did require debridement with a #3 curette I removed eschar skin and subcutaneous tissue from the wound bed. This has minimal amount of depth but it is somewhat deep relative to the circumference there is no palpable deep structures Integumentary (Hair,  Skin) Wound #17 status is Open. Original cause of wound was Trauma. The wound is located on the Right [name] Fourth. The wound measures 0.3cm length x 0.3cm oe width x 0.1cm depth; 0.071cm^2 area and 0.007cm^3 volume. There is no tunneling or undermining noted. There is a none present amount of drainage noted. The wound margin is distinct with the outline attached to the wound base. There is no granulation within the wound bed. There is a large (67-100%) amount of necrotic tissue within the wound bed including Eschar and Adherent Slough. Wound #7 status is Open. Original cause of wound was Gradually Appeared. The wound is located on the Right,Lateral Foot. The wound measures 0.7cm length x 1cm width x 0.1cm depth; 0.55cm^2 area and 0.055cm^3 volume. There is Fat Layer (Subcutaneous Tissue) exposed. There is no tunneling or undermining noted. There is a medium amount of serosanguineous drainage noted. The wound margin is flat and intact. There is large (67-100%) red granulation within the wound bed. There is a small (1-33%) amount of necrotic tissue within the wound bed including Adherent Slough. Assessment Active Problems ICD-10 Type 2 diabetes mellitus with foot ulcer Disruption of external operation (surgical) wound, not elsewhere classified, initial encounter Non-pressure chronic ulcer of other  part of right foot with fat layer exposed Type 2 diabetes mellitus with diabetic chronic kidney disease Procedures Wound #17 Pre-procedure diagnosis of Wound #17 is a Diabetic Wound/Ulcer of the Lower Extremity located on the Right [name] Fourth .Severity of Tissue Pre Debridement oe is: Fat layer exposed. There was a Excisional Skin/Subcutaneous Tissue Debridement with a total area of 0.09 sq cm performed by Ricard Dillon., MD. With the following instrument(s): Curette to remove Viable and Non-Viable tissue/material. Material removed includes Subcutaneous Tissue and Slough and after achieving pain control  using Other (benzocaine 20% spray). No specimens were taken. A time out was conducted at 12:05, prior to the start of the procedure. A Minimum amount of bleeding was controlled with Pressure. The procedure was tolerated well with a pain level of 0 throughout and a pain level of 0 following the procedure. Post Debridement Measurements: 0.3cm length x 0.3cm width x 0.1cm depth; 0.007cm^3 volume. Character of Wound/Ulcer Post Debridement is improved. Severity of Tissue Post Debridement is: Fat layer exposed. Post procedure Diagnosis Wound #17: Same as Pre-Procedure Wound #7 Pre-procedure diagnosis of Wound #7 is a Diabetic Wound/Ulcer of the Lower Extremity located on the Right,Lateral Foot . There was a Four Layer Compression Therapy Procedure by Deon Pilling, RN. Post procedure Diagnosis Wound #7: Same as Pre-Procedure Plan Follow-up Appointments: Return Appointment in 2 weeks. - Thursday Dressing Change Frequency: Change dressing three times week. - HH to change Monday and Thursday Skin Barriers/Peri-Wound Care: Moisturizing lotion - both legs Wound Cleansing: Clean wound with Normal Saline. - or wound cleanser Primary Wound Dressing: Wound #17 Right [name] Fourth: oe Hydrofera Blue - Classic Wound #7 Right,Lateral Foot: Hydrofera Blue - Classic Secondary Dressing: Wound #17 Right [name] Fourth: oe Kerlix/Rolled Gauze Dry Gauze Wound #7 Right,Lateral Foot: Dry Gauze Edema Control: 4 layer compression - Right Lower Extremity Avoid standing for long periods of time Elevate legs to the level of the heart or above for 30 minutes daily and/or when sitting, a frequency of: - throughout the day Exercise regularly Support Garment 30-40 mm/Hg pressure to: - Juxtalite to the Left Leg daily Home Health: [facility] for wound care. - [facility] 1. I continued with Hydrofera Blue to both wound areas 2. Continue with 4-layer compression on the right lower leg. 3. We will  have to get him a juxta lite stocking for the right leg Electronic Signature(s) Signed: 02/05/2020 1:46:42 PM By: Linton Ham MD Entered By: Linton Ham on 02/02/2020 12:48:12 -------------------------------------------------------------------------------- SuperBill Details Patient Name: Date of Service: Jeffery [name], Jeffery [name]. 02/02/2020 Medical Record Number: [id number] Patient Account Number: [id number] Date of Birth/Sex: Treating RN: [birth year] (59 y.o. 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 Facility Procedures CPT4 Code: 13244010 Description: 11042 - DEB SUBQ TISSUE 20 SQ CM/< ICD-10 Diagnosis Description L97.512 Non-pressure chronic ulcer of other part of right foot with fat layer exposed Modifier: Quantity: 1 CPT4 Code: 27253664 Description: (Facility Use Only) [id number] - APPLY MULTLAY COMPRS LWR RT LEG Modifier: 59 Quantity: 1 Physician Procedures : CPT4 Code Description Modifier [id number] 11042 - WC PHYS SUBQ TISS 20 SQ  CM ICD-10 Diagnosis Description L97.512 Non-pressure chronic ulcer of other part of right foot with fat layer exposed Quantity: 1 Electronic Signature(s) Signed: 02/05/2020 1:46:42 PM By: Linton Ham MD Entered By: Linton Ham on 02/02/2020 12:48:27

## 2020-02-05 NOTE — Progress Notes (Signed)
SADIK, PIASCIK [name] ([id number]) . Visit Report for 02/02/2020 Arrival Information Details Patient Name: Date of Service: Jeffery [name]. 02/02/2020 11:00 A M Medical Record Number: [id number] Patient Account Number: [id number] Date of Birth/Sex: Treating RN: 14-Dec-1960 (58 y.o. Jeffery [name]) Carlene Coria Primary Care Provider: [name] Other Clinician: Referring Provider: Treating Provider/Extender: [name] Weeks in Treatment: 14 Visit Information History Since Last Visit All ordered tests and consults were completed: No Patient Arrived: Ambulatory Added or deleted any medications: No Arrival Time: 11:22 Any new allergies or adverse reactions: No Accompanied By: self Had a fall or experienced change in No Transfer Assistance: None activities of daily living that may affect Patient Identification Verified: Yes risk of falls: Secondary Verification Process Completed: Yes Signs or symptoms of abuse/neglect since last visito No Patient Requires Transmission-Based Precautions: No Hospitalized since last visit: No Patient Has Alerts: No Implantable device outside of the clinic excluding No cellular tissue based products placed in the center since last visit: Has Dressing in Place as Prescribed: Yes Has Compression in Place as Prescribed: Yes Pain Present Now: No Electronic Signature(s) Signed: 02/02/2020 5:00:24 PM By: Carlene Coria RN Entered By: Carlene Coria on 02/02/2020 11:23:35 -------------------------------------------------------------------------------- Compression Therapy Details Patient Name: Date of Service: Jeffery [name]. 02/02/2020 11:00 A M Medical Record Number: [id number] Patient Account Number: [id number] Date of Birth/Sex: Treating RN: 11/11/1960 (59 y.o. Jeffery [name] Primary Care Provider: [name] Other Clinician: Referring Provider: Treating Provider/Extender: [name] Weeks in  Treatment: 14 Compression Therapy Performed for Wound Assessment: Wound #7 Right,Lateral Foot Performed By: Clinician Deon Pilling, RN Compression Type: Four Layer Post Procedure Diagnosis Same as Pre-procedure Electronic Signature(s) Signed: 02/02/2020 6:06:29 PM By: Baruch Gouty RN, BSN Entered By: Baruch Gouty on 02/02/2020 12:11:41 -------------------------------------------------------------------------------- Lower Extremity Assessment Details Patient Name: Date of Service: Jeffery [name]. 02/02/2020 11:00 A M Medical Record Number: [id number] Patient Account Number: [id number] Date of Birth/Sex: Treating RN: [birth year] (58 y.o. Oval Linsey Primary Care Provider: [name] Other Clinician: Referring Provider: Treating Provider/Extender: [name] Weeks in Treatment: 14 Edema Assessment Assessed: Jeffery [name]: No] [Right: No] Edema: [Left: Ye] [Right: s] Calf Left: Right: Point of Measurement: 48 cm From Medial Instep 39 cm Ankle Left: Right: Point of Measurement: 14 cm From Medial Instep 23 cm Electronic Signature(s) Signed: 02/02/2020 5:00:24 PM By: Carlene Coria RN Entered By: Carlene Coria on 02/02/2020 11:27:37 -------------------------------------------------------------------------------- Multi Wound Chart Details Patient Name: Date of Service: Jeffery [name] [name]. 02/02/2020 11:00 A M Medical Record Number: [id number] Patient Account Number: [id number] Date of Birth/Sex: Treating RN: 03/28/1961 (59 y.o. Jeffery [name] Primary Care Provider: [name] Other Clinician: Referring Provider: Treating Provider/Extender: [name] Weeks in Treatment: 14 Vital Signs Height(in): 74 Pulse(bpm): 81 Weight(lbs): 345 Blood Pressure(mmHg): 147/76 Body Mass Index(BMI): 44 Temperature(F): 97.7 Respiratory Rate(breaths/min): 20 Photos: [17:No Photos Right [name] Fourth oe] [N/A:No Photos N/A Right,  Lateral Foot N/A] Wound Location: [17:Trauma] [N/A:Gradually Appeared N/A] Wounding Event: [17:Diabetic Wound/Ulcer of the Lower] [N/A:Diabetic Wound/Ulcer of the Lower N/A] Primary Etiology: [17:Extremity Congestive Heart Failure,] [N/A:Extremity Congestive Heart Failure, N/A] Comorbid History: [17:Hypertension, Type II Diabetes 01/12/2020] [N/A:Hypertension, Type II Diabetes 08/02/2019 N/A] Date Acquired: [17:2] [N/A:14 N/A] Weeks of Treatment: [17:Open] [N/A:Open N/A] Wound Status: [17:0.3x0.3x0.1] [N/A:0.7x1x0.1 N/A] Measurements L x W x D (cm) [17:0.071] [N/A:0.55 N/A] A (cm) : rea [17:0.007] [N/A:0.055 N/A] Volume (cm) : [17:57.00%] [N/A:94.80% N/A] % Reduction in Area: [17:56.30%] [N/A:99.30%  N/A] % Reduction in Volume: [17:Unable to visualize wound bed] [N/A:Grade 2 N/A] Classification: [17:None Present] [N/A:Medium N/A] Exudate A mount: [17:N/A] [N/A:Serosanguineous N/A] Exudate Type: [17:N/A] [N/A:red, [place] N/A] Exudate Color: [17:Distinct, outline attached] [N/A:Flat and Intact N/A] Wound Margin: [17:None Present (0%)] [N/A:Large (67-100%) N/A] Granulation Amount: [17:N/A] [N/A:Red N/A] Granulation Quality: [17:Large (67-100%)] [N/A:Small (1-33%) N/A] Necrotic Amount: [17:Eschar, Adherent Slough] [N/A:Adherent Slough N/A] Necrotic Tissue: [17:Fascia: No] [N/A:Fat Layer (Subcutaneous Tissue): Yes N/A] Exposed Structures: [17:Fat Layer (Subcutaneous Tissue): No Tendon: No Muscle: No Joint: No Bone: No None] [N/A:Fascia: No Tendon: No Muscle: No Joint: No Bone: No Small (1-33%) N/A] Epithelialization: [17:Debridement - Excisional] [N/A:N/A N/A] Debridement: Pre-procedure Verification/Time Out 12:05 [N/A:N/A N/A] Taken: [17:Other] [N/A:N/A N/A] Pain Control: [17:Subcutaneous, Slough] [N/A:N/A N/A] Tissue Debrided: [17:Skin/Subcutaneous Tissue] [N/A:N/A N/A] Level: [17:0.09] [N/A:N/A N/A] Debridement A (sq cm): [17:rea Curette] [N/A:N/A N/A] Instrument: [17:Minimum] [N/A:N/A  N/A] Bleeding: [17:Pressure] [N/A:N/A N/A] Hemostasis A chieved: [17:0] [N/A:N/A N/A] Procedural Pain: [17:0] [N/A:N/A N/A] Post Procedural Pain: [17:Procedure was tolerated well] [N/A:N/A N/A] Debridement Treatment Response: [17:0.3x0.3x0.1] [N/A:N/A N/A] Post Debridement Measurements L x W x D (cm) [17:0.007] [N/A:N/A N/A] Post Debridement Volume: (cm) [17:Debridement] [N/A:Compression Therapy N/A] Treatment Notes Electronic Signature(s) Signed: 02/02/2020 6:06:29 PM By: Baruch Gouty RN, BSN Signed: 02/05/2020 1:46:42 PM By: Linton Ham MD Entered By: Linton Ham on 02/02/2020 12:43:47 -------------------------------------------------------------------------------- Multi-Disciplinary Care Plan Details Patient Name: Date of Service: Jeffery Raring, [place] [name]. 02/02/2020 11:00 A M Medical Record Number: [id number] Patient Account Number: [id number] Date of Birth/Sex: Treating RN: 28-Apr-1960 (59 y.o. Jeffery [name] Primary Care Provider: [name] Other Clinician: Referring Provider: Treating Provider/Extender: [name] Weeks in Treatment: 14 Active Inactive Abuse / Safety / Falls / Self Care Management Nursing Diagnoses: Potential for falls Potential for injury related to falls Goals: Patient will remain injury free related to falls Date Initiated: 10/23/2019 Date Inactivated: 02/02/2020 Target Resolution Date: 02/02/2020 Goal Status: Met Patient/caregiver will verbalize/demonstrate measures taken to prevent injury and/or falls Date Initiated: 10/23/2019 Target Resolution Date: 03/01/2020 Goal Status: Active Interventions: Assess Activities of Daily Living upon admission and as needed Assess fall risk on admission and as needed Assess: immobility, friction, shearing, incontinence upon admission and as needed Assess impairment of mobility on admission and as needed per policy Assess personal safety and home safety (as indicated) on  admission and as needed Assess self care needs on admission and as needed Provide education on fall prevention Provide education on personal and home safety Notes: Venous Leg Ulcer Nursing Diagnoses: Knowledge deficit related to disease process and management Potential for venous Insuffiency (use before diagnosis confirmed) Goals: Patient will maintain optimal edema control Date Initiated: 10/23/2019 Target Resolution Date: 03/01/2020 Goal Status: Active Patient/caregiver will verbalize understanding of disease process and disease management Date Initiated: 10/23/2019 Date Inactivated: 02/02/2020 Target Resolution Date: 02/02/2020 Goal Status: Met Interventions: Assess peripheral edema status every visit. Compression as ordered Provide education on venous insufficiency Notes: Wound/Skin Impairment Nursing Diagnoses: Impaired tissue integrity Knowledge deficit related to ulceration/compromised skin integrity Goals: Patient/caregiver will verbalize understanding of skin care regimen Date Initiated: 10/23/2019 Target Resolution Date: 03/01/2020 Goal Status: Active Interventions: Assess patient/caregiver ability to obtain necessary supplies Assess patient/caregiver ability to perform ulcer/skin care regimen upon admission and as needed Assess ulceration(s) every visit Provide education on ulcer and skin care Notes: Electronic Signature(s) Signed: 02/02/2020 6:06:29 PM By: Baruch Gouty RN, BSN Entered By: Baruch Gouty on 02/02/2020 12:08:56 -------------------------------------------------------------------------------- Pain Assessment Details Patient Name: Date of Service: BRO WN, CHRISTO  PHER [name]. 02/02/2020 11:00 A M Medical Record Number: [id number] Patient Account Number: [id number] Date of Birth/Sex: Treating RN: 07-17-1960 (58 y.o. Jeffery [name]) Carlene Coria Primary Care [place]: [name] Other Clinician: Referring Verl Whitmore: Treating Fusaye Wachtel/Extender: [name] Weeks in Treatment: 14 Active Problems Location of Pain Severity and Description of Pain Patient Has Paino No Site Locations Pain Management and Medication Current Pain Management: Electronic Signature(s) Signed: 02/02/2020 5:00:24 PM By: Carlene Coria RN Entered By: Carlene Coria on 02/02/2020 11:24:37 -------------------------------------------------------------------------------- Patient/Caregiver Education Details Patient Name: Date of Service: [name] 11/5/2021andnbsp11:00 A M Medical Record Number: [id number] Patient Account Number: [id number] Date of Birth/Gender: Treating RN: Dec 03, 1960 (59 y.o. Jeffery [name] Primary Care Physician: [name] Other Clinician: Referring Physician: Treating Physician/Extender: [name] in Treatment: 14 Education Assessment Education Provided To: Patient Education Topics Provided Venous: Methods: Explain/Verbal Responses: Reinforcements needed, State content correctly Wound/Skin Impairment: Methods: Explain/Verbal Responses: Reinforcements needed, State content correctly Electronic Signature(s) Signed: 02/02/2020 6:06:29 PM By: Baruch Gouty RN, BSN Entered By: Baruch Gouty on 02/02/2020 12:09:29 -------------------------------------------------------------------------------- Wound Assessment Details Patient Name: Date of Service: Jeffery [name]. 02/02/2020 11:00 A M Medical Record Number: [id number] Patient Account Number: [id number] Date of Birth/Sex: Treating RN: May 05, 1960 (58 y.o. Jeffery [name]) Carlene Coria Primary Care Norm Wray: [name] Other Clinician: Referring Macel Yearsley: Treating Wilbert Schouten/Extender: [name] Weeks in Treatment: 14 Wound Status Wound Number: 17 Primary Etiology: Diabetic Wound/Ulcer of the Lower Extremity Wound Location: Right [name] Fourth oe Wound Status: Open Wounding Event: Trauma Comorbid  History: Congestive Heart Failure, Hypertension, Type II Diabetes Date Acquired: 01/12/2020 Weeks Of Treatment: 2 Clustered Wound: No Wound Measurements Length: (cm) 0.3 Width: (cm) 0.3 Depth: (cm) 0.1 Area: (cm) 0.071 Volume: (cm) 0.007 % Reduction in Area: 57% % Reduction in Volume: 56.3% Epithelialization: None Tunneling: No Undermining: No Wound Description Classification: Unable to visualize wound bed Wound Margin: Distinct, outline attached Exudate Amount: None Present Foul Odor After Cleansing: No Slough/Fibrino Yes Wound Bed Granulation Amount: None Present (0%) Exposed Structure Necrotic Amount: Large (67-100%) Fascia Exposed: No Necrotic Quality: Eschar, Adherent Slough Fat Layer (Subcutaneous Tissue) Exposed: No Tendon Exposed: No Muscle Exposed: No Joint Exposed: No Bone Exposed: No Electronic Signature(s) Signed: 02/02/2020 5:00:24 PM By: Carlene Coria RN Entered By: Carlene Coria on 02/02/2020 11:29:34 -------------------------------------------------------------------------------- Wound Assessment Details Patient Name: Date of Service: Jeffery [name]. 02/02/2020 11:00 A M Medical Record Number: [id number] Patient Account Number: [id number] Date of Birth/Sex: Treating RN: 1960-08-10 (58 y.o. Jeffery [name]) Carlene Coria Primary Care Gaylene Moylan: [name] Other Clinician: Referring Chanz Cahall: Treating Henryetta Corriveau/Extender: [name] Weeks in Treatment: 14 Wound Status Wound Number: 7 Primary Etiology: Diabetic Wound/Ulcer of the Lower Extremity Wound Location: Right, Lateral Foot Wound Status: Open Wounding Event: Gradually Appeared Comorbid History: Congestive Heart Failure, Hypertension, Type II Diabetes Date Acquired: 08/02/2019 Weeks Of Treatment: 14 Clustered Wound: No Wound Measurements Length: (cm) 0.7 Width: (cm) 1 Depth: (cm) 0.1 Area: (cm) 0.55 Volume: (cm) 0.055 % Reduction in Area: 94.8% % Reduction in Volume:  99.3% Epithelialization: Small (1-33%) Tunneling: No Undermining: No Wound Description Classification: Grade 2 Wound Margin: Flat and Intact Exudate Amount: Medium Exudate Type: Serosanguineous Exudate Color: red, [place] Foul Odor After Cleansing: No Slough/Fibrino Yes Wound Bed Granulation Amount: Large (67-100%) Exposed Structure Granulation Quality: Red Fascia Exposed: No Necrotic Amount: Small (1-33%) Fat Layer (Subcutaneous Tissue) Exposed: Yes Necrotic Quality: Adherent Slough Tendon Exposed: No Muscle Exposed: No Joint Exposed: No Bone  Exposed: No Electronic Signature(s) Signed: 02/02/2020 5:00:24 PM By: Carlene Coria RN Entered By: Carlene Coria on 02/02/2020 11:29:35 -------------------------------------------------------------------------------- [place] Details Patient Name: Date of Service: Jeffery Raring, [place] [name]. 02/02/2020 11:00 A M Medical Record Number: [id number] Patient Account Number: [id number] Date of Birth/Sex: Treating RN: [birth year] (58 y.o. Jeffery [name]) Carlene Coria Primary Care Provider: [name] Other Clinician: Referring Provider: Treating Provider/Extender: [name] Weeks in Treatment: 14 Vital Signs Time Taken: 11:23 Temperature (F): 97.7 Height (in): 74 Pulse (bpm): 81 Weight (lbs): 345 Respiratory Rate (breaths/min): 20 Body Mass Index (BMI): 44.3 Blood Pressure (mmHg): 147/76 Reference Range: 80 - 120 mg / dl Electronic Signature(s) Signed: 02/02/2020 5:00:24 PM By: Carlene Coria RN Entered By: Carlene Coria on 02/02/2020 11:24:29

## 2020-02-15 ENCOUNTER — Other Ambulatory Visit: Payer: Self-pay | Admitting: Cardiovascular Disease

## 2020-02-15 ENCOUNTER — Encounter (HOSPITAL_BASED_OUTPATIENT_CLINIC_OR_DEPARTMENT_OTHER): Payer: Medicaid Other | Admitting: Internal Medicine

## 2020-02-15 ENCOUNTER — Other Ambulatory Visit: Payer: Self-pay

## 2020-02-15 DIAGNOSIS — L97812 Non-pressure chronic ulcer of other part of right lower leg with fat layer exposed: Secondary | ICD-10-CM | POA: Diagnosis not present

## 2020-02-15 DIAGNOSIS — E11621 Type 2 diabetes mellitus with foot ulcer: Secondary | ICD-10-CM | POA: Diagnosis not present

## 2020-02-15 DIAGNOSIS — I872 Venous insufficiency (chronic) (peripheral): Secondary | ICD-10-CM | POA: Diagnosis not present

## 2020-02-15 NOTE — Progress Notes (Signed)
Jeffery Jeffery [name] Jeffery [name] ([id number]) . Visit Report for 02/15/2020 Debridement Details Patient Name: Date of Service: Jeffery Jeffery [name]. 02/15/2020 1:00 PM Medical Record Number: [id number] Patient Account Number: [id number] Date of Birth/Sex: Treating RN: Jeffery Jeffery [name] (59 y.o. Jeffery Jeffery [name] Primary Care Provider: Charlott Jeffery [name] Other Clinician: Referring Provider: Treating Provider/Extender: Jeffery Jeffery [name] Weeks in Treatment: 16 Debridement Performed for Assessment: Wound #18 Right,Posterior Lower Leg Performed By: Physician Jeffery Jeffery [name]., MD Debridement Type: Debridement Severity of Tissue Pre Debridement: Fat layer exposed Level of Consciousness (Pre-procedure): Awake and Alert Pre-procedure Verification/Time Out Yes - 13:25 Taken: Start Time: 13:26 Pain Control: Lidocaine 4% Jeffery [name] opical Solution Jeffery [name] Area Debrided (L x W): otal 2.8 (cm) x 2.9 (cm) = 8.12 (cm) Tissue and other material debrided: Viable, Non-Viable, Eschar, Subcutaneous, Skin: Dermis , Fibrin/Exudate Level: Skin/Subcutaneous Tissue Debridement Description: Excisional Instrument: Curette Bleeding: Minimum Hemostasis Achieved: Pressure End Time: 13:30 Procedural Pain: 0 Post Procedural Pain: 2 Response to Treatment: Procedure was tolerated well Level of Consciousness (Post- Awake and Alert procedure): Post Debridement Measurements of Total Wound Length: (cm) 2.8 Width: (cm) 2.9 Depth: (cm) 0.2 Volume: (cm) 1.275 Character of Wound/Ulcer Post Debridement: Requires Further Debridement Severity of Tissue Post Debridement: Fat layer exposed Post Procedure Diagnosis Same as Pre-procedure Electronic Signature(s) Signed: 02/15/2020 5:14:03 PM By: Jeffery Ham MD Signed: 02/15/2020 5:38:55 PM By: Jeffery Jeffery [name] Entered By: Jeffery Jeffery [name] on 02/15/2020 14:13:39 -------------------------------------------------------------------------------- HPI Details Patient Name: Date of  Service: Jeffery Jeffery [name], Jeffery Jeffery [name]. 02/15/2020 1:00 PM Medical Record Number: [id number] Patient Account Number: [id number] Date of Birth/Sex: Treating RN: [name] (59 y.o. Jeffery Jeffery [name] Primary Care Provider: Charlott Jeffery [name] Other Clinician: Referring Provider: Treating Provider/Extender: Jeffery Jeffery [name] Weeks in Treatment: 16 History of Present Illness HPI Description: ADMISSION 10/23/2019 This is a complex 59 year old man who is here with wounds on his bilateral lower legs feet and abrasion on his left forearm. These are generally of different etiology. He is a type II diabetic with peripheral neuropathy but does not have known PAD. Looking through [facility] health link I can see he was being followed by Dr. Earleen [name] earlier this year for a diabetic ulcer on the right foot. An MRI done in May showed no osteomyelitis. Shortly thereafter he developed cellulitis of the right leg and foot and was hospitalized from 5/10 through 5/19 with MSSA sepsis. During this hospitalization he was felt to have osteomyelitis he had debridement twice of the right foot and on one occasion apparently a placement of ACell. He again was hospitalized from 6/10 through 7/1 again with left lower extremity cellulitis increasing edema and acute renal failure. An MRI showed left leg cellulitis but no other major findings. 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 He was discharged with multiple wounds on his legs which apparently started as blisters secondary to fluid overload. His creatinine on 7/5 was 7.49 potassium 4 BUN 116 CO2 of 23 albumin of 2.9. He was seen by Dr. Moshe [name] of nephrology medications were adjusted she is following him. 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 The patient arrives in clinic today with multiple wounds of different etiologies on his bilateral lower extremities. On the right leg he has  superficial areas on the right medial and lateral.. On his right lateral foot I think the original surgical wound. There is a small area at the base of his left second toe. On the left lower  extremity a fairly large wound on the left second toe which was trauma from the shower door. He has several areas on the legs. Which are superficial. Finally he has an abrasion injury on his left arm/skin tear Past medical history includes Charcot foot. Left third toe amputation, type 2 diabetes with peripheral neuropathy, paroxysmal A. fib on Eliquis, diastolic heart failure, acute on chronic renal failure as described. ABI in our clinic was 1.18 on the right 1.19 on the left 11/03/2019 upon evaluation today patient appears to be doing well with regard to his ulcers in general. He is making good progress. The worst is his larger right lateral foot ulcer. This wound is going require some sharp debridement today. Other than that he seems to be doing quite well. 11/10/19-Patient has denuded areas on both legs especially the right where there is more stasis changes and friable skin, areas on his right leg appear to be bigger than before, right lateral foot wound appears about the same. We are using Hydrofera Blue to the foot wound and silver alginate on the rest with 4 layer compression 8/19; patient's wounds on his bilateral anterior lower legs look a lot better. These appear to be progressing towards healing and some of them actually have healed. The most substantial area is on the right lateral foot. I thought this was his original surgical wound site. This has raised hyper granulation tissue and a completely nonviable surface. We have been using silver alginate to all the wounds on his legs under compression and Hydrofera Blue on the right lateral 8/27; most of the wounds are improving here except for the one on the right lateral foot. Still hyper granulation were using Hydrofera Blue in this area.he does not have  an arterial issue. The wound on the right lateral foot I think was an original surgical wound. I wonder whether he inverts at the ankle and not to complicate healing this. In review E does not have an arterial issue.MRI of the foot did not show osteomyelitis in May He still has several open wounds on the bilateral lower legs although these are a lot better. We are using silver alginate under for R compression 9/10; right lateral foot wound which is a surgical wound is still open. He still has bilateral small wounds question venous lymphedema. He developed a new one in the posterior left calf. We have his right leg in compression he has been wearing a compression garment on the left leg. We are going to wrap both legs today. He does not have an arterial issue. MRI did not suggest osteomyelitis of the right foot 9/17; right lateral foot wound still hyper granulated. He has superficial areas on the right anterior lower extremity which are a bit worse this week. There is nothing open on the left leg but the swelling on the left leg is worse than the right this looks like chronic venous insufficiency. We ordered him juxta lite stockings but he still has not received them. He tells me that his creatinine is improving he follows with nephrology. 9/24; right lateral foot. The wound still has a very adherent necrotic surface although I am able to debride this to help something healthy. The area on the right anterior leg in the setting of chronic venous insufficiency and lymphedema is superficial and looks healthy. We will continue with Hydrofera Blue on the foot silver alginate on the leg. We could not get wraparound stockings because he has home health. They are changing the dressing. Home  health will not wrap the left leg because he does not have a wound. 10/8; right lateral foot the wound is smaller. Healthy looking surface no debridement is required. He does have a juxta lite stocking for the left leg  there is no open wound here but he does have chronic lymphedema and chronic venous insufficiency with stasis dermatitis looking changes. We will continue to wrap his right leg 10/22; right lateral foot slightly smaller debris on the surface. Comes in today with a new area on the right fourth toe over the DIP. I am not precisely sure how this happened. It looks like a foot wear injury although he denies this states he never wears shoes at home and he only wears them when he goes out to appointments. He says he took his sock off and noticed blood there. He has a juxta lite stocking for the left leg we have been wrapping the right 11/5; right lateral foot slightly smaller does not have any debris on the surface. The area over the right dorsal fourth toe has a completely nonviable surface. We have been using Hydrofera Blue in both areas he has a juxta lite stocking on the right left leg and were wrapping the right 11/18; right lateral foot slightly smaller healthy looking wound surface. This is original surgical site. He comes in today with a new area on the right posterior calf probably a wrap injury. Necrotic surface of the required debridement. Also concerning is the amount of edema in the left leg. There is no open wounds here he has been using a juxta lite stocking but I am just not sure after discussing things with him how reliable he is with it Electronic Signature(s) Signed: 02/15/2020 5:14:03 PM By: Jeffery Ham MD Entered By: Jeffery Jeffery [name] on 02/15/2020 14:15:29 -------------------------------------------------------------------------------- Physical Exam Details Patient Name: Date of Service: Jeffery Slice PHER Jeffery [name]. 02/15/2020 1:00 PM Medical Record Number: [id number] Patient Account Number: [id number] Date of Birth/Sex: Treating RN: [name] (59 y.o. Jeffery Jeffery [name] Primary Care Provider: Charlott Jeffery [name] Other Clinician: Referring Provider: Treating Provider/Extender: Jeffery Jeffery [name] Weeks in Treatment: 16 Constitutional Patient is hypertensive.. Pulse regular and within target range for patient.Marland Kitchen Respirations regular, non-labored and within target range.. Temperature is normal and within the target range for the patient.Marland Kitchen Appears in no distress. Notes Wound exam; right lateral foot healthy looking area. Granulation looks healthy. Some improvement in size Right posterior calf necrotic wound linear horizontal. No doubt a wrap injury. I used the used a #3 curette to remove necrotic surface material. Hemostasis with direct pressure He does not have any open wounds on the left leg however he has severe lymphedema with venous stasis changes on the anterior leg. The amount of swelling here is really significant. Electronic Signature(s) Signed: 02/15/2020 5:14:03 PM By: Jeffery Ham MD Entered By: Jeffery Jeffery [name] on 02/15/2020 14:16:39 -------------------------------------------------------------------------------- Physician Orders Details Patient Name: Date of Service: Jeffery Jeffery [name], [place] Jeffery [name]. 02/15/2020 1:00 PM Medical Record Number: [id number] Patient Account Number: [id number] Date of Birth/Sex: Treating RN: [name] (59 y.o. Jeffery Jeffery [name] Primary Care Provider: Charlott Jeffery [name] Other Clinician: Referring Provider: Treating Provider/Extender: Jeffery Jeffery [name] in Treatment: 16 Verbal / Phone Orders: No Diagnosis Coding ICD-10 Coding Code Description E11.621 Type 2 diabetes mellitus with foot ulcer T81.31XA Disruption of external operation (surgical) wound, not elsewhere classified, initial encounter L97.512 Non-pressure chronic ulcer of other part of right foot with fat layer exposed E11.22 Type 2 diabetes mellitus with diabetic chronic kidney  disease Follow-up Appointments ppointment in 2 weeks. - Friday 03/01/2020 Return A Dressing Change Frequency Change dressing three times week. - HH to change Monday and  Thursday Skin Barriers/Peri-Wound Care Moisturizing lotion - both legs Wound Cleansing Clean wound with Normal Saline. - or wound cleanser Primary Wound Dressing Wound #17 Right Jeffery [name] Fourth oe Hydrofera Blue - Classic Wound #18 Right,Posterior Lower Leg Hydrofera Blue - Classic Wound #7 Right,Lateral Foot Hydrofera Blue - Classic Secondary Dressing Wound #17 Right Jeffery [name] Fourth oe Kerlix/Rolled Gauze Dry Gauze Wound #18 Right,Posterior Lower Leg Dry Gauze ABD pad Wound #7 Right,Lateral Foot Dry Gauze ABD pad Edema Control 4 layer compression - Right Lower Extremity Avoid standing for long periods of time Elevate legs to the level of the heart or above for 30 minutes daily and/or when sitting, a frequency of: - throughout the day Exercise regularly Other: - 40-28mmHg compression garment juxtalite HD to left leg. ensure to apply in the morning and remove at night. please apply lotion daily. [facility] for wound care. - [facility] Electronic Signature(s) Signed: 02/15/2020 5:14:03 PM By: Jeffery Ham MD Signed: 02/15/2020 5:38:55 PM By: Jeffery Jeffery [name] Entered By: Jeffery Jeffery [name] on 02/15/2020 13:37:04 -------------------------------------------------------------------------------- Problem List Details Patient Name: Date of Service: Jeffery Jeffery [name], [place] Jeffery [name]. 02/15/2020 1:00 PM Medical Record Number: [id number] Patient Account Number: [id number] Date of Birth/Sex: Treating RN: [birth year] [name] (59 y.o. Jeffery Jeffery [name] Primary Care Provider: Charlott Jeffery [name] Other Clinician: Referring Provider: Treating Provider/Extender: Jeffery Jeffery [name] Weeks in Treatment: 16 Active Problems ICD-10 Encounter Code Description Active Date MDM Diagnosis E11.621 Type 2 diabetes mellitus with foot ulcer 10/23/2019 No Yes T81.31XA Disruption of external operation (surgical) wound, not elsewhere classified, 10/23/2019 No Yes initial encounter L97.512  Non-pressure chronic ulcer of other part of right foot with fat layer exposed 11/03/2019 No Yes L97.218 Non-pressure chronic ulcer of right calf with other specified severity 02/15/2020 No Yes E11.22 Type 2 diabetes mellitus with diabetic chronic kidney disease 10/23/2019 No Yes Inactive Problems ICD-10 Code Description Active Date Inactive Date L97.811 Non-pressure chronic ulcer of other part of right lower leg limited to breakdown of skin 10/23/2019 10/23/2019 L97.821 Non-pressure chronic ulcer of other part of left lower leg limited to breakdown of skin 10/23/2019 10/23/2019 S90.812D Abrasion, left foot, subsequent encounter 10/23/2019 10/23/2019 S50.812D Abrasion of left forearm, subsequent encounter 10/23/2019 10/23/2019 Resolved Problems Electronic Signature(s) Signed: 02/15/2020 5:14:03 PM By: Jeffery Ham MD Entered By: Jeffery Jeffery [name] on 02/15/2020 14:12:48 -------------------------------------------------------------------------------- Progress Note Details Patient Name: Date of Service: Jeffery Jeffery [name], [place] Jeffery [name]. 02/15/2020 1:00 PM Medical Record Number: [id number] Patient Account Number: [id number] Date of Birth/Sex: Treating RN: [birth year] [name] (59 y.o. Jeffery Jeffery [name] Primary Care Provider: Charlott Jeffery [name] Other Clinician: Referring Provider: Treating Provider/Extender: Jeffery Jeffery [name] Weeks in Treatment: 16 Subjective History of Present Illness (HPI) ADMISSION 10/23/2019 This is a complex 59 year old man who is here with wounds on his bilateral lower legs feet and abrasion on his left forearm. These are generally of different etiology. He is a type II diabetic with peripheral neuropathy but does not have known PAD. Looking through [facility] health link I can see he was being followed by Dr. Earleen [name] earlier this year for a diabetic ulcer on the right foot. An MRI done in May showed no osteomyelitis. Shortly thereafter he developed cellulitis of the right leg and foot and was  hospitalized from 5/10 through 5/19 with MSSA sepsis. During this hospitalization he was felt to have osteomyelitis he had  debridement twice of the right foot and on one occasion apparently a placement of ACell. He again was hospitalized from 6/10 through 7/1 again with left lower extremity cellulitis increasing edema and acute renal failure. An MRI showed left leg cellulitis but no other major findings. 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 He was discharged with multiple wounds on his legs which apparently started as blisters secondary to fluid overload. His creatinine on 7/5 was 7.49 potassium 4 BUN 116 CO2 of 23 albumin of 2.9. He was seen by Dr. Moshe [name] of nephrology medications were adjusted she is following him. 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 The patient arrives in clinic today with multiple wounds of different etiologies on his bilateral lower extremities. ooOn the right leg he has superficial areas on the right medial and lateral.. On his right lateral foot I think the original surgical wound. There is a small area at the base of his left second toe. ooOn the left lower extremity a fairly large wound on the left second toe which was trauma from the shower door. He has several areas on the legs. Which are superficial. ooFinally he has an abrasion injury on his left arm/skin tear Past medical history includes Charcot foot. Left third toe amputation, type 2 diabetes with peripheral neuropathy, paroxysmal A. fib on Eliquis, diastolic heart failure, acute on chronic renal failure as described. ABI in our clinic was 1.18 on the right 1.19 on the left 11/03/2019 upon evaluation today patient appears to be doing well with regard to his ulcers in general. He is making good progress. The worst is his larger right lateral foot ulcer. This wound is going require some sharp debridement today.  Other than that he seems to be doing quite well. 11/10/19-Patient has denuded areas on both legs especially the right where there is more stasis changes and friable skin, areas on his right leg appear to be bigger than before, right lateral foot wound appears about the same. We are using Hydrofera Blue to the foot wound and silver alginate on the rest with 4 layer compression 8/19; patient's wounds on his bilateral anterior lower legs look a lot better. These appear to be progressing towards healing and some of them actually have healed. The most substantial area is on the right lateral foot. I thought this was his original surgical wound site. This has raised hyper granulation tissue and a completely nonviable surface. We have been using silver alginate to all the wounds on his legs under compression and Hydrofera Blue on the right lateral 8/27; most of the wounds are improving here except for the one on the right lateral foot. Still hyper granulation were using Hydrofera Blue in this area.he does not have an arterial issue. The wound on the right lateral foot I think was an original surgical wound. I wonder whether he inverts at the ankle and not to complicate healing this. In review E does not have an arterial issue.MRI of the foot did not show osteomyelitis in May He still has several open wounds on the bilateral lower legs although these are a lot better. We are using silver alginate under for R compression 9/10; right lateral foot wound which is a surgical wound is still open. He still has bilateral small wounds question venous lymphedema. He developed a new one in the posterior left calf. We have his right leg in compression he has been wearing  a compression garment on the left leg. We are going to wrap both legs today. He does not have an arterial issue. MRI did not suggest osteomyelitis of the right foot 9/17; right lateral foot wound still hyper granulated. He has superficial areas on the  right anterior lower extremity which are a bit worse this week. There is nothing open on the left leg but the swelling on the left leg is worse than the right this looks like chronic venous insufficiency. We ordered him juxta lite stockings but he still has not received them. He tells me that his creatinine is improving he follows with nephrology. 9/24; right lateral foot. The wound still has a very adherent necrotic surface although I am able to debride this to help something healthy. The area on the right anterior leg in the setting of chronic venous insufficiency and lymphedema is superficial and looks healthy. We will continue with Hydrofera Blue on the foot silver alginate on the leg. We could not get wraparound stockings because he has home health. They are changing the dressing. Home health will not wrap the left leg because he does not have a wound. 10/8; right lateral foot the wound is smaller. Healthy looking surface no debridement is required. He does have a juxta lite stocking for the left leg there is no open wound here but he does have chronic lymphedema and chronic venous insufficiency with stasis dermatitis looking changes. We will continue to wrap his right leg 10/22; right lateral foot slightly smaller debris on the surface. Comes in today with a new area on the right fourth toe over the DIP. I am not precisely sure how this happened. It looks like a foot wear injury although he denies this states he never wears shoes at home and he only wears them when he goes out to appointments. He says he took his sock off and noticed blood there. He has a juxta lite stocking for the left leg we have been wrapping the right 11/5; right lateral foot slightly smaller does not have any debris on the surface. The area over the right dorsal fourth toe has a completely nonviable surface. We have been using Hydrofera Blue in both areas he has a juxta lite stocking on the right left leg and were  wrapping the right 11/18; right lateral foot slightly smaller healthy looking wound surface. This is original surgical site. He comes in today with a new area on the right posterior calf probably a wrap injury. Necrotic surface of the required debridement. Also concerning is the amount of edema in the left leg. There is no open wounds here he has been using a juxta lite stocking but I am just not sure after discussing things with him how reliable he is with it Objective Constitutional Patient is hypertensive.. Pulse regular and within target range for patient.Marland Kitchen Respirations regular, non-labored and within target range.. Temperature is normal and within the target range for the patient.Marland Kitchen Appears in no distress. Vitals Time Taken: 1:13 PM, Height: 74 in, Weight: 345 lbs, BMI: 44.3, Temperature: 98.4 F, Pulse: 86 bpm, Respiratory Rate: 19 breaths/min, Blood Pressure: 166/96 mmHg, Capillary Blood Glucose: 140 mg/dl. General Notes: Wound exam; right lateral foot healthy looking area. Granulation looks healthy. Some improvement in size ooRight posterior calf necrotic wound linear horizontal. No doubt a wrap injury. I used the used a #3 curette to remove necrotic surface material. Hemostasis with direct pressure He does not have any open wounds on the left leg however he  has severe lymphedema with venous stasis changes on the anterior leg. The amount of swelling here is really significant. Integumentary (Hair, Skin) Wound #17 status is Open. Original cause of wound was Trauma. The wound is located on the Right Jeffery [name] Fourth. The wound measures 0.2cm length x 0.3cm oe width x 0.1cm depth; 0.047cm^2 area and 0.005cm^3 volume. There is no tunneling or undermining noted. There is a none present amount of drainage noted. The wound margin is distinct with the outline attached to the wound base. There is medium (34-66%) pink granulation within the wound bed. There is a medium (34-66%) amount of necrotic tissue  within the wound bed including Adherent Slough. Wound #18 status is Open. Original cause of wound was Gradually Appeared. The wound is located on the Right,Posterior Lower Leg. The wound measures 2.8cm length x 2.9cm width x 0.1cm depth; 6.377cm^2 area and 0.638cm^3 volume. There is no tunneling or undermining noted. There is a medium amount of serosanguineous drainage noted. The wound margin is distinct with the outline attached to the wound base. There is small (1-33%) pink granulation within the wound bed. There is a large (67-100%) amount of necrotic tissue within the wound bed including Eschar and Adherent Slough. Wound #7 status is Open. Original cause of wound was Gradually Appeared. The wound is located on the Right,Lateral Foot. The wound measures 1.2cm length x 0.8cm width x 0.1cm depth; 0.754cm^2 area and 0.075cm^3 volume. There is Fat Layer (Subcutaneous Tissue) exposed. There is no tunneling or undermining noted. There is a medium amount of serosanguineous drainage noted. The wound margin is flat and intact. There is large (67-100%) red granulation within the wound bed. There is a small (1-33%) amount of necrotic tissue within the wound bed. 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 There was a Excisional Skin/Subcutaneous Tissue Debridement with a total area of 8.12 sq cm performed by Jeffery Jeffery [name]., MD. With the following instrument(s): Curette to remove Viable and  Non-Viable tissue/material. Material removed includes Eschar, Subcutaneous Tissue, Skin: Dermis, and Fibrin/Exudate after achieving pain control using Lidocaine 4% Topical Solution. A time out was conducted at 13:25, prior to the start of the procedure. A Minimum amount of bleeding was controlled with Pressure. The procedure was tolerated well with a pain level of 0 throughout and a pain level of 2 following the procedure. Post Debridement Measurements: 2.8cm length x 2.9cm width x 0.2cm depth; 1.275cm^3 volume. Character of Wound/Ulcer Post Debridement requires further debridement. Severity of Tissue Post Debridement is: Fat layer exposed. 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 There was a Four Layer Compression Therapy Procedure by Jeffery Pilling, RN. Post procedure Diagnosis Wound #18: Same as Pre-Procedure Plan Follow-up Appointments: Return Appointment in 2 weeks. - Friday 03/01/2020 Dressing Change Frequency: Change dressing three times week. - HH to change Monday and Thursday Skin Barriers/Peri-Wound Care: Moisturizing lotion - both legs Wound Cleansing: Clean wound with Normal Saline. - or wound cleanser Primary Wound Dressing: Wound #  41 Right Jeffery [name] Fourth: oe Hydrofera Blue - Classic Wound #18 Right,Posterior Lower Leg: Hydrofera Blue - Classic Wound #7 Right,Lateral Foot: Hydrofera Blue - Classic Secondary Dressing: Wound #17 Right Jeffery [name] Fourth: oe Kerlix/Rolled Gauze Dry Gauze Wound #18 Right,Posterior Lower Leg: Dry Gauze ABD pad Wound #7 Right,Lateral Foot: Dry Gauze ABD pad Edema Control: 4 layer compression - Right Lower Extremity Avoid standing for long periods of time Elevate legs to the level of the heart or above for 30 minutes daily and/or when sitting, a frequency of: - throughout the day Exercise regularly Other: - 40-49mmHg compression garment  juxtalite HD to left leg. ensure to apply in the morning and remove at night. please apply lotion daily. Home Health: [facility] for [facility]. - [facility]. I continued with the Hydrofera Blue to the right foot and the Hydrofera Blue was applied to the new wound on the posterior right calf just above the Achilles area 2. We went over the left leg. We have increased the tension on the stockings to 40/50. I am not sure how much she is using this however if this is not the reason we are going to have to look at something more aggressive here if he will agree to it Electronic Signature(s) Signed: 02/15/2020 5:14:03 PM By: Jeffery Ham MD Entered By: Jeffery Jeffery [name] on 02/15/2020 14:18:24 -------------------------------------------------------------------------------- SuperBill Details Patient Name: Date of Service: Jeffery Jeffery [name], Joyice Faster Jeffery [name]. 02/15/2020 Medical Record Number: [id number] Patient Account Number: [id number] Date of Birth/Sex: Treating RN: [birth year] [name] (59 y.o. Jeffery Jeffery [name] Primary Care Provider: Charlott Jeffery [name] Other Clinician: Referring Provider: Treating Provider/Extender: Jeffery Jeffery [name] Weeks in Treatment: 16 Diagnosis Coding ICD-10 Codes Code Description E11.621 Type 2 diabetes mellitus with foot ulcer T81.31XA Disruption of external operation (surgical) wound, not elsewhere classified, initial encounter L97.512 Non-pressure chronic ulcer of other part of right foot with fat layer exposed L97.218 Non-pressure chronic ulcer of right calf with other specified severity E11.22 Type 2 diabetes mellitus with diabetic chronic kidney disease Facility Procedures CPT4 Code: [id number] Description: 43329 - DEB SUBQ TISSUE 20 SQ CM/< ICD-10 Diagnosis Description L97.218 Non-pressure chronic ulcer of right calf with other specified severity Modifier: Quantity: 1 Physician Procedures : CPT4 Code Description Modifier [id number] - WC  PHYS SUBQ TISS 20 SQ CM ICD-10 Diagnosis Description Z60.109 Non-pressure chronic ulcer of right calf with other specified severity Quantity: 1 Electronic Signature(s) Signed: 02/15/2020 5:14:03 PM By: Jeffery Ham MD Entered By: Jeffery Jeffery [name] on 02/15/2020 14:18:49

## 2020-02-16 ENCOUNTER — Encounter: Payer: Self-pay | Admitting: Cardiovascular Disease

## 2020-02-16 ENCOUNTER — Other Ambulatory Visit: Payer: Self-pay

## 2020-02-16 ENCOUNTER — Encounter (HOSPITAL_COMMUNITY)
Admission: RE | Admit: 2020-02-16 | Discharge: 2020-02-16 | Disposition: A | Discharge status: Home / Self Care | Payer: Medicaid Other | Source: Ambulatory Visit | Attending: Nephrology | Admitting: Nephrology

## 2020-02-16 ENCOUNTER — Ambulatory Visit (INDEPENDENT_AMBULATORY_CARE_PROVIDER_SITE_OTHER): Payer: Medicaid Other | Admitting: Cardiovascular Disease

## 2020-02-16 VITALS — BP 149/79 | HR 74 | Temp 97.8°F | Resp 18

## 2020-02-16 VITALS — BP 146/68 | HR 79 | Ht 74.0 in | Wt 353.2 lb

## 2020-02-16 DIAGNOSIS — N179 Acute kidney failure, unspecified: Secondary | ICD-10-CM | POA: Diagnosis not present

## 2020-02-16 DIAGNOSIS — I480 Paroxysmal atrial fibrillation: Secondary | ICD-10-CM

## 2020-02-16 DIAGNOSIS — I5033 Acute on chronic diastolic (congestive) heart failure: Secondary | ICD-10-CM

## 2020-02-16 DIAGNOSIS — Z79899 Other long term (current) drug therapy: Secondary | ICD-10-CM | POA: Diagnosis not present

## 2020-02-16 DIAGNOSIS — I10 Essential (primary) hypertension: Secondary | ICD-10-CM | POA: Diagnosis not present

## 2020-02-16 DIAGNOSIS — Z7901 Long term (current) use of anticoagulants: Secondary | ICD-10-CM

## 2020-02-16 DIAGNOSIS — R600 Localized edema: Secondary | ICD-10-CM | POA: Diagnosis not present

## 2020-02-16 DIAGNOSIS — Z5181 Encounter for therapeutic drug level monitoring: Secondary | ICD-10-CM

## 2020-02-16 DIAGNOSIS — N183 Chronic kidney disease, stage 3 unspecified: Secondary | ICD-10-CM | POA: Diagnosis not present

## 2020-02-16 LAB — RENAL FUNCTION PANEL
Albumin: 3.8 g/dL (ref 3.5–5.0)
Anion gap: 10 (ref 5–15)
BUN: 40 mg/dL — ABNORMAL HIGH (ref 6–20)
CO2: 25 mmol/L (ref 22–32)
Calcium: 9.4 mg/dL (ref 8.9–10.3)
Chloride: 103 mmol/L (ref 98–111)
Creatinine, Ser: 2.35 mg/dL — ABNORMAL HIGH (ref 0.61–1.24)
GFR, Estimated: 31 mL/min — ABNORMAL LOW (ref >60)
Glucose, Bld: 178 mg/dL — ABNORMAL HIGH (ref 70–99)
Phosphorus: 5.1 mg/dL — ABNORMAL HIGH (ref 2.5–4.6)
Potassium: 4.1 mmol/L (ref 3.5–5.1)
Sodium: 138 mmol/L (ref 135–145)

## 2020-02-16 LAB — IRON AND TIBC
Iron: 63 ug/dL (ref 45–182)
Saturation Ratios: 21 % (ref 17.9–39.5)
TIBC: 305 ug/dL (ref 250–450)
UIBC: 242 ug/dL

## 2020-02-16 LAB — FERRITIN: Ferritin: 339 ng/mL — ABNORMAL HIGH (ref 24–336)

## 2020-02-16 LAB — POCT HEMOGLOBIN-HEMACUE: Hemoglobin: 10.5 g/dL — ABNORMAL LOW (ref 13.0–17.0)

## 2020-02-16 MED ORDER — APIXABAN 2.5 MG PO TABS: 5.0000 mg | ORAL_TABLET | Freq: Two times a day (BID) | ORAL | 6 refills | Status: AC

## 2020-02-16 MED ORDER — HYDRALAZINE HCL 50 MG PO TABS: 75.0000 mg | ORAL_TABLET | Freq: Two times a day (BID) | ORAL | 6 refills | Status: AC

## 2020-02-16 MED ORDER — EPOETIN ALFA-EPBX 10000 UNIT/ML IJ SOLN
INTRAMUSCULAR | Status: AC
  Administered 2020-02-16: 20000 [IU]
  Filled 2020-02-16: qty 2

## 2020-02-16 MED ORDER — AMIODARONE HCL 200 MG PO TABS: 300.0000 mg | ORAL_TABLET | Freq: Two times a day (BID) | ORAL | 6 refills | Status: AC

## 2020-02-16 MED ORDER — EPOETIN ALFA-EPBX 10000 UNIT/ML IJ SOLN: 20000.0000 [IU] | INTRAMUSCULAR | Status: DC

## 2020-02-16 NOTE — Progress Notes (Signed)
RAFIEL, MECCA T ([id number]) . Visit Report for 02/15/2020 Arrival Information Details Patient Name: Date of Service: Jeffery [name] [facility] T. 02/15/2020 1:00 PM Medical Record Number: [id number] Patient Account Number: [id number] Date of Birth/Sex: Treating RN: 1961/01/21 (59 y.o. [name] Primary Care Bryam Taborda: [name] Other Clinician: Referring Cia Garretson: Treating Layani Foronda/Extender: [name] in Treatment: 16 Visit Information History Since Last Visit Added or deleted any medications: No Patient Arrived: Ambulatory Any new allergies or adverse reactions: No Arrival Time: 13:10 Had a fall or experienced change in No Accompanied By: self activities of daily living that may affect Transfer Assistance: None risk of falls: Patient Identification Verified: Yes Signs or symptoms of abuse/neglect since last visito No Secondary Verification Process Completed: Yes Hospitalized since last visit: No Patient Requires Transmission-Based Precautions: No Implantable device outside of the clinic excluding No Patient Has Alerts: No cellular tissue based products placed in the center since last visit: Has Dressing in Place as Prescribed: Yes Has Compression in Place as Prescribed: No Pain Present Now: Yes Notes pt. took compression wrap off this morning to take a Licensed conveyancer) Signed: 02/16/2020 12:02:47 PM By: Rhae Hammock RN Entered By: Rhae Hammock on 02/15/2020 13:12:54 -------------------------------------------------------------------------------- Compression Therapy Details Patient Name: Date of Service: Jeffery [name] [facility] T. 02/15/2020 1:00 PM Medical Record Number: [id number] Patient Account Number: [id number] Date of Birth/Sex: Treating RN: 1961-03-16 (59 y.o. [name] Primary Care Eli Adami: [name] Other Clinician: Referring Ashten Prats: Treating Mollee Neer/Extender: [name] Weeks in Treatment: 16 Compression Therapy Performed for Wound Assessment: Wound #18 Right,Posterior Lower Leg Performed By: Clinician Deon Pilling, RN Compression Type: Four Layer Post Procedure Diagnosis Same as Pre-procedure Electronic Signature(s) Signed: 02/15/2020 5:38:55 PM By: Deon Pilling Entered By: Deon Pilling on 02/15/2020 13:31:58 -------------------------------------------------------------------------------- Encounter Discharge Information Details Patient Name: Date of Service: Jeffery [name], Jeffery T. 02/15/2020 1:00 PM Medical Record Number: [id number] Patient Account Number: [id number] Date of Birth/Sex: Treating RN: 06-05-1960 (59 y.o. [name] Primary Care Fatemah Pourciau: [name] Other Clinician: Referring Lissett Favorite: Treating Johnnie Moten/Extender: [name] in Treatment: 16 Encounter Discharge Information Items Post Procedure Vitals Discharge Condition: Stable Temperature (F): 98.4 Ambulatory Status: Ambulatory Pulse (bpm): 86 Discharge Destination: Home Respiratory Rate (breaths/min): 20 Transportation: Private Auto Blood Pressure (mmHg): 166/96 Accompanied By: self Schedule Follow-up Appointment: Yes Clinical Summary of Care: Patient Declined Electronic Signature(s) Signed: 02/15/2020 2:58:18 PM By: Baruch Gouty RN, BSN Entered By: Baruch Gouty on 02/15/2020 13:49:38 -------------------------------------------------------------------------------- Lower Extremity Assessment Details Patient Name: Date of Service: Jeffery [name], [place]ery [place] T. 02/15/2020 1:00 PM Medical Record Number: [id number] Patient Account Number: [id number] Date of Birth/Sex: Treating RN: [birth year] (59 y.o. [name] Primary Care Marwa Fuhrman: [name] Other Clinician: Referring Dane Kopke: Treating Varnell Orvis/Extender: [name] Weeks in Treatment: 16 Edema Assessment Assessed: [name]: No] Patrice Paradise:  No] Edema: [Left: Ye] [Right: s] Calf Left: Right: Point of Measurement: 48 cm From Medial Instep 45.5 cm Ankle Left: Right: Point of Measurement: 14 cm From Medial Instep 24.5 cm Vascular Assessment Pulses: Dorsalis Pedis Palpable: [Right:Yes] Posterior Tibial Palpable: [Right:Yes] Electronic Signature(s) Signed: 02/15/2020 5:38:55 PM By: Deon Pilling Signed: 02/16/2020 12:02:47 PM By: Rhae Hammock RN Entered By: Rhae Hammock on 02/15/2020 13:21:55 -------------------------------------------------------------------------------- Multi Wound Chart Details Patient Name: Date of Service: Jeffery [name], [place] T. 02/15/2020 1:00 PM Medical Record Number: [id number] Patient Account Number: [id number] Date of Birth/Sex: Treating RN: 12/12/1960 (59 y.o. [name] Primary Care Abimelec Grochowski: Margarita Rana,  Enobong Other Clinician: Referring Amiel Mccaffrey: Treating Laurette Villescas/Extender: Kandice Robinsons, Charlane Ferretti Weeks in Treatment: 16 Vital Signs Height(in): 74 Capillary Blood Glucose(mg/dl): 140 Weight(lbs): 345 Pulse(bpm): 86 Body Mass Index(BMI): 44 Blood Pressure(mmHg): 166/96 Temperature(F): 98.4 Respiratory Rate(breaths/min): 19 Photos: [17:No Photos Right T Fourth oe] [18:No Photos Right, Posterior Lower Leg] Wound Location: [17:Trauma] [18:Gradually Appeared] Wounding Event: [17:Diabetic Wound/Ulcer of the Lower] [18:Venous Leg Ulcer] Primary Etiology: [17:Extremity Congestive Heart Failure,] [18:Congestive Heart Failure,] Comorbid History: [17:Hypertension, Type II Diabetes 01/12/2020] [18:Hypertension, Type II Diabetes 02/09/2020] Date Acquired: [17:3] [18:0] Weeks of Treatment: [17:Open] [18:Open] Wound Status: [17:0.2x0.3x0.1] [18:2.8x2.9x0.1] Measurements L x W x D (cm) [17:0.047] [18:6.377] A (cm) : rea [17:0.005] [18:0.638] Volume (cm) : [17:71.50%] [18:N/A] % Reduction in A [17:rea: 68.80%] [18:N/A] % Reduction in Volume: [17:Unable to visualize  wound bed] [18:Partial Thickness] Classification: [17:None Present] [18:Medium] Exudate A mount: [17:N/A] [18:Serosanguineous] Exudate Type: [17:N/A] [18:red, Ranes] Exudate Color: [17:Distinct, outline attached] [18:Distinct, outline attached] Wound Margin: [17:Medium (34-66%)] [18:Small (1-33%)] Granulation A mount: [17:Pink] [18:Pink] Granulation Quality: [17:Medium (34-66%)] [18:Large (67-100%)] Necrotic A mount: [17:Adherent Slough] [18:Eschar, Adherent Slough] Necrotic Tissue: [17:Fascia: No] [18:Fascia: No] Exposed Structures: [17:Fat Layer (Subcutaneous Tissue): No Tendon: No Muscle: No Joint: No Bone: No None] [18:Fat Layer (Subcutaneous Tissue): No Fascia: No Tendon: No Muscle: No Joint: No Bone: No None] Epithelialization: [17:N/A] [18:Debridement - Excisional] Debridement: Pre-procedure Verification/Time Out N/A [18:13:25] Taken: [17:N/A] [18:Lidocaine 4% Topical Solution] Pain Control: [17:N/A] [18:Necrotic/Eschar, Subcutaneous] Tissue Debrided: [17:N/A] [18:Skin/Subcutaneous Tissue] Level: [17:N/A] [18:8.12] Debridement A (sq cm): [17:rea N/A] [18:Curette] Instrument: [17:N/A] [18:Minimum] Bleeding: [17:N/A] [18:Pressure] Hemostasis A chieved: [17:N/A] [18:0] Procedural Pain: [17:N/A] [18:2] Post Procedural Pain: [17:N/A] [18:Procedure was tolerated well] Debridement Treatment Response: [17:N/A] [18:2.8x2.9x0.2] Post Debridement Measurements L x W x D (cm) [17:N/A] [18:1.275] Post Debridement Volume: (cm) [17:N/A] [18:Compression [employer] Procedures Performed: [18:Debridement] Treatment Notes Wound #17 (Right Toe Fourth) 3. Primary Dressing Applied Hydrofera Blue 4. Secondary Dressing Roll Gauze Wound #18 (Right, Posterior Lower Leg) 2. Periwound Care Moisturizing lotion 3. Primary Dressing Applied Hydrofera Blue 4. Secondary Dressing ABD Pad Dry Gauze 6. Support Layer Applied 4 layer compression wrap Wound #7 (Right, Lateral Foot) 2. Periwound  Care Moisturizing lotion 3. Primary Dressing Applied Hydrofera Blue 4. Secondary Dressing ABD Pad Dry Gauze 6. Support Layer Applied 4 layer compression Water quality scientist) Signed: 02/15/2020 5:14:03 PM By: Linton Ham MD Signed: 02/15/2020 5:38:55 PM By: Deon Pilling Entered By: Linton Ham on 02/15/2020 14:13:19 -------------------------------------------------------------------------------- Multi-Disciplinary Care Plan Details Patient Name: Date of Service: Jeffery [name], Jeffery T. 02/15/2020 1:00 PM Medical Record Number: [id number] Patient Account Number: [id number] Date of Birth/Sex: Treating RN: 06/28/1960 (59 y.o. [name] Primary Care Britt Petroni: [name] Other Clinician: Referring Katey Barrie: Treating Jaymes Hang/Extender: [name] Weeks in Treatment: 16 Active Inactive Abuse / Safety / Falls / Self Care Management Nursing Diagnoses: Potential for falls Potential for injury related to falls Goals: Patient will remain injury free related to falls Date Initiated: 10/23/2019 Date Inactivated: 02/02/2020 Target Resolution Date: 02/02/2020 Goal Status: Met Patient/caregiver will verbalize/demonstrate measures taken to prevent injury and/or falls Date Initiated: 10/23/2019 Target Resolution Date: 03/01/2020 Goal Status: Active Interventions: Assess Activities of Daily Living upon admission and as needed Assess fall risk on admission and as needed Assess: immobility, friction, shearing, incontinence upon admission and as needed Assess impairment of mobility on admission and as needed per policy Assess personal safety and home safety (as indicated) on admission and as needed Assess self care needs on admission and as needed Provide  education on fall prevention Provide education on personal and home safety Notes: Venous Leg Ulcer Nursing Diagnoses: Knowledge deficit related to disease process and management Potential  for venous Insuffiency (use before diagnosis confirmed) Goals: Patient will maintain optimal edema control Date Initiated: 10/23/2019 Target Resolution Date: 03/01/2020 Goal Status: Active Patient/caregiver will verbalize understanding of disease process and disease management Date Initiated: 10/23/2019 Date Inactivated: 02/02/2020 Target Resolution Date: 02/02/2020 Goal Status: Met Interventions: Assess peripheral edema status every visit. Compression as ordered Provide education on venous insufficiency Notes: Wound/Skin Impairment Nursing Diagnoses: Impaired tissue integrity Knowledge deficit related to ulceration/compromised skin integrity Goals: Patient/caregiver will verbalize understanding of skin care regimen Date Initiated: 10/23/2019 Target Resolution Date: 03/01/2020 Goal Status: Active Interventions: Assess patient/caregiver ability to obtain necessary supplies Assess patient/caregiver ability to perform ulcer/skin care regimen upon admission and as needed Assess ulceration(s) every visit Provide education on ulcer and skin care Notes: Electronic Signature(s) Signed: 02/15/2020 5:38:55 PM By: Deon Pilling Entered By: Deon Pilling on 02/15/2020 13:20:19 -------------------------------------------------------------------------------- Pain Assessment Details Patient Name: Date of Service: Jeffery Porter T. 02/15/2020 1:00 PM Medical Record Number: [id number] Patient Account Number: [id number] Date of Birth/Sex: Treating RN: [birth year] (59 y.o. [name] Primary Care Toshiro Hanken: [name] Other Clinician: Referring Skarlett Sedlacek: Treating Carri Spillers/Extender: [name] Weeks in Treatment: 16 Active Problems Location of Pain Severity and Description of Pain Patient Has Paino Yes Site Locations Pain Location: Pain Location: Generalized Pain Rate the pain. Current Pain Level: 8 Worst Pain Level: 10 Least Pain Level: 0 Tolerable  Pain Level: 8 Character of Pain Describe the Pain: Aching Pain Management and Medication Current Pain Management: Medication: Yes Cold Application: No Rest: Yes Massage: No Activity: No ChandlerE.N.S.: No Heat Application: No Leg drop or elevation: No Is the Current Pain Management Adequate: Adequate How does your wound impact your activities of daily livingo Sleep: No Bathing: No Appetite: No Relationship With Others: No Bladder Continence: No Emotions: No Bowel Continence: No Work: No Toileting: No Drive: No Dressing: No Hobbies: No Electronic Signature(s) Signed: 02/15/2020 5:38:55 PM By: Deon Pilling Signed: 02/16/2020 12:02:47 PM By: Rhae Hammock RN Entered By: Rhae Hammock on 02/15/2020 13:13:27 -------------------------------------------------------------------------------- Patient/Caregiver Education Details Patient Name: Date of Service: Jeffery [name] 11/18/2021andnbsp1:00 PM Medical Record Number: [id number] Patient Account Number: [id number] Date of Birth/Gender: Treating RN: [birth year] (59 y.o. [name] Primary Care Physician: [name] Other Clinician: Referring Physician: Treating Physician/Extender: [name] in Treatment: 16 Education Assessment Education Provided To: Patient Education Topics Provided Safety: Handouts: Personal Safety Methods: Explain/Verbal Responses: Reinforcements needed Electronic Signature(s) Signed: 02/15/2020 5:38:55 PM By: Deon Pilling Entered By: Deon Pilling on 02/15/2020 13:20:33 -------------------------------------------------------------------------------- Wound Assessment Details Patient Name: Date of Service: Jeffery [name] [facility] T. 02/15/2020 1:00 PM Medical Record Number: [id number] Patient Account Number: [id number] Date of Birth/Sex: Treating RN: 1960/03/31 (59 y.o. [name] Primary Care Manilla Strieter: [name] Other  Clinician: Referring Lexys Milliner: Treating Amethyst Gainer/Extender: [name] Weeks in Treatment: 16 Wound Status Wound Number: 17 Primary Etiology: Diabetic Wound/Ulcer of the Lower Extremity Wound Location: Right T Fourth oe Wound Status: Open Wounding Event: Trauma Comorbid History: Congestive Heart Failure, Hypertension, Type II Diabetes Date Acquired: 01/12/2020 Weeks Of Treatment: 3 Clustered Wound: No Wound Measurements Length: (cm) 0.2 Width: (cm) 0.3 Depth: (cm) 0.1 Area: (cm) 0.047 Volume: (cm) 0.005 % Reduction in Area: 71.5% % Reduction in Volume: 68.8% Epithelialization: None Tunneling: No Undermining: No Wound Description Classification: Unable to visualize  wound bed Wound Margin: Distinct, outline attached Exudate Amount: None Present Foul Odor After Cleansing: No Slough/Fibrino Yes Wound Bed Granulation Amount: Medium (34-66%) Exposed Structure Granulation Quality: Pink Fascia Exposed: No Necrotic Amount: Medium (34-66%) Fat Layer (Subcutaneous Tissue) Exposed: No Necrotic Quality: Adherent Slough Tendon Exposed: No Muscle Exposed: No Joint Exposed: No Bone Exposed: No Treatment Notes Wound #17 (Right Toe Fourth) 3. Primary Dressing Applied Hydrofera Blue 4. Secondary Dressing Roll Gauze Electronic Signature(s) Signed: 02/15/2020 5:38:55 PM By: Deon Pilling Signed: 02/16/2020 12:02:47 PM By: Rhae Hammock RN Entered By: Rhae Hammock on 02/15/2020 13:19:01 -------------------------------------------------------------------------------- Wound Assessment Details Patient Name: Date of Service: Jeffery [name], Jeffery T. 02/15/2020 1:00 PM Medical Record Number: [id number] Patient Account Number: [id number] Date of Birth/Sex: Treating RN: 06-Feb-1961 (59 y.o. [name] Primary Care Bomani Oommen: [name] Other Clinician: Referring Sarahlynn Cisnero: Treating Leala Bryand/Extender: [name] Weeks in  Treatment: 16 Wound Status Wound Number: 18 Primary Etiology: Venous Leg Ulcer Wound Location: Right, Posterior Lower Leg Wound Status: Open Wounding Event: Gradually Appeared Comorbid History: Congestive Heart Failure, Hypertension, Type II Diabetes Date Acquired: 02/09/2020 Weeks Of Treatment: 0 Clustered Wound: No Wound Measurements Length: (cm) 2.8 Width: (cm) 2.9 Depth: (cm) 0.1 Area: (cm) 6.377 Volume: (cm) 0.638 % Reduction in Area: % Reduction in Volume: Epithelialization: None Tunneling: No Undermining: No Wound Description Classification: Partial Thickness Wound Margin: Distinct, outline attached Exudate Amount: Medium Exudate Type: Serosanguineous Exudate Color: red, Arzola Foul Odor After Cleansing: No Slough/Fibrino Yes Wound Bed Granulation Amount: Small (1-33%) Exposed Structure Granulation Quality: Pink Fascia Exposed: No Necrotic Amount: Large (67-100%) Fat Layer (Subcutaneous Tissue) Exposed: No Necrotic Quality: Eschar, Adherent Slough Tendon Exposed: No Muscle Exposed: No Joint Exposed: No Bone Exposed: No Treatment Notes Wound #18 (Right, Posterior Lower Leg) 2. Periwound Care Moisturizing lotion 3. Primary Dressing Applied Hydrofera Blue 4. Secondary Dressing ABD Pad Dry Gauze 6. Support Layer Applied 4 layer compression wrap Electronic Signature(s) Signed: 02/15/2020 5:38:55 PM By: Deon Pilling Signed: 02/16/2020 12:02:47 PM By: Rhae Hammock RN Entered By: Rhae Hammock on 02/15/2020 13:21:34 -------------------------------------------------------------------------------- Wound Assessment Details Patient Name: Date of Service: Jeffery [name], Jeffery Jose T. 02/15/2020 1:00 PM Medical Record Number: [id number] Patient Account Number: [id number] Date of Birth/Sex: Treating RN: October 17, 1960 (59 y.o. [name] Primary Care Jarryn Altland: [name] Other Clinician: Referring Nesta Scaturro: Treating Cortana Vanderford/Extender: [name] Weeks in Treatment: 16 Wound Status Wound Number: 7 Primary Etiology: Diabetic Wound/Ulcer of the Lower Extremity Wound Location: Right, Lateral Foot Wound Status: Open Wounding Event: Gradually Appeared Comorbid History: Congestive Heart Failure, Hypertension, Type II Diabetes Date Acquired: 08/02/2019 Weeks Of Treatment: 16 Clustered Wound: No Wound Measurements Length: (cm) 1.2 Width: (cm) 0.8 Depth: (cm) 0.1 Area: (cm) 0.754 Volume: (cm) 0.075 % Reduction in Area: 92.9% % Reduction in Volume: 99% Epithelialization: Small (1-33%) Tunneling: No Undermining: No Wound Description Classification: Grade 2 Wound Margin: Flat and Intact Exudate Amount: Medium Exudate Type: Serosanguineous Exudate Color: red, Hagemann Foul Odor After Cleansing: No Slough/Fibrino Yes Wound Bed Granulation Amount: Large (67-100%) Exposed Structure Granulation Quality: Red Fascia Exposed: No Necrotic Amount: Small (1-33%) Fat Layer (Subcutaneous Tissue) Exposed: Yes Tendon Exposed: No Muscle Exposed: No Joint Exposed: No Bone Exposed: No Treatment Notes Wound #7 (Right, Lateral Foot) 2. Periwound Care Moisturizing lotion 3. Primary Dressing Applied Hydrofera Blue 4. Secondary Dressing ABD Pad Dry Gauze 6. Support Layer Applied 4 layer compression Water quality scientist) Signed: 02/15/2020 5:38:55 PM By: Deon Pilling Signed: 02/16/2020 12:02:47 PM By: Hollie Salk  Lauren RN Entered By: Rhae Hammock on 02/15/2020 13:19:26 -------------------------------------------------------------------------------- Vitals Details Patient Name: Date of Service: Jeffery Porter T. 02/15/2020 1:00 PM Medical Record Number: [id number] Patient Account Number: [id number] Date of Birth/Sex: Treating RN: 01/27/1961 (59 y.o. [name] Primary Care Montavious Wierzba: [name] Other Clinician: Referring Amyria Komar: Treating Nurah Petrides/Extender: [name] Weeks in Treatment: 16 Vital Signs Time Taken: 13:13 Temperature (F): 98.4 Height (in): 74 Pulse (bpm): 86 Weight (lbs): 345 Respiratory Rate (breaths/min): 19 Body Mass Index (BMI): 44.3 Blood Pressure (mmHg): 166/96 Capillary Blood Glucose (mg/dl): 140 Reference Range: 80 - 120 mg / dl Electronic Signature(s) Signed: 02/16/2020 12:02:47 PM By: Rhae Hammock RN Entered By: Rhae Hammock on 02/15/2020 13:15:41

## 2020-02-16 NOTE — Patient Instructions (Signed)
Medication Instructions:  DECREASE AMIODARONE 300MG  DAILY  TAKE HYDRALAZINE 75 MG(1-1/2 TAN) TWICE DAILY  INCREASE ELIQUIS 5MG  TWICE DAILY  *If you need a refill on your cardiac medications before your next appointment, please call your pharmacy*  Lab Work:   Testing/Procedures:  NONE    NONE  Follow-Up: Your next appointment:  3 month(s) In Person with Shelva Majestic, MD   At [facility], you and your health needs are our priority.  As part of our continuing mission to provide you with exceptional heart care, we have created designated Provider Care Teams.  These Care Teams include your primary Cardiologist (physician) and Advanced Practice Providers (APPs -  Physician Assistants and Nurse Practitioners) who all work together to provide you with the care you need, when you need it.  We recommend signing up for the patient portal called "MyChart".  Sign up information is provided on this After Visit Summary.  MyChart is used to connect with patients for Virtual Visits (Telemedicine).  Patients are able to view lab/test results, encounter notes, upcoming appointments, etc.  Non-urgent messages can be sent to your provider as well.   To learn more about what you can do with MyChart, go to [email or web address].

## 2020-02-16 NOTE — Progress Notes (Signed)
Cardiology Office Note    Date:  02/17/2020   ID:  Jeffery [name], DOB 03-06-1961, MRN [id number]  PCP:  [name], MD  Cardiologist:  Shelva Majestic, MD     History of Present Illness:  Jeffery [name] is a 59 y.o. male who is the son of my former patient Mr.Jeffery [name]. He established cardiology care with me in December 2018.  He presents for an 11-monthfollow-up cardiology evaluation.  Mr. BSawayahas a history of obesity, diabetes mellitus, and hypertension.  He previously had a significant EtOH history and was drinking up to 4 cases of beer per week.  In May after having significant amount of beer the night before he woke up in the morning feeling weak.  He presented and was found to be in atrial fibrillation of new onset.  He was placed on Lopressor and eliquis and underwent successful cardioversion.  His cha2ds2vasc score was 2.  He was seen in A. fib clinic in follow-up on 08/27/2016 and was in sinus rhythm with PACs.  An echo Doppler study showed an EF of 50-55% with grade 1 diastolic dysfunction.  His left atrium was moderately dilated.  There was trivial MR.  He had done well maintaining sinus rhythm until August when again he had another alcohol binge and was back in atrial fibrillation.  He had been having difficulty with his back and had been off eliquis a week earlier to allow for treatment with ibuprofen.  He again was cardioverted for his A. fib with a rate of 169 on presentation and eliquis was resumed.  The patient has not had any alcohol since that presentation and quit smoking.    When I saw him in December 2018 he was maintaining sinus rhythm. He was having significant difficulty with his back. He has a Charcot foot and has difficulty walking. He denied any chest tightness or chest pressure.  He was unable to exercise due to his back discomfort. He had run out of his metoprolol as pulses in the 90s with PACs.. I further titrated metoprolol to 75 mg twice a  day, and he has continued to be on losartan 50 mg daily for HTN.   I saw him in March 2019 at which time he had noticed occasional palpitations.  He was unable to exercise due to his back and foot.  He quit tobacco and alcohol on November 01, 2016.  He saw LKerin Ransomfollowing an ER evaluation  with complaints of dyspnea at rest.  He thought he had gone back into atrial fibrillation but in the emergency room he was in normal sinus rhythm.  Troponins were negative.  A close friend had recently had CABG revascularization.  He subsequently was referred for an nuclear perfusion study which was normal on November 11, 2017.  An echo Doppler study revealed normal EF at 55 to 60% with grade 1 diastolic dysfunction.  Peak PA pressure was minimally increased at 33 mm.  He has chronic pain syndrome and apparently the pain clinic prescribes Percocet.  He lives with his mother.  [facility] E[facility]akes  I saw him in October 2019 at which time he was remaining stable.  He continues to be morbidly obese and we discussed the importance of weight loss.  We also discussed evaluating him for sleep apnea.  He was unaware of any recurrent episodes of atrial fibrillation.  We discussed the importance of exercise and followed up  laboratory.  I evaluated him on February 13, 2019 prior to undergoing rotator cuff surgery which was tentatively scheduled for the following week.  Over several weeks prior to that evaluation he had noticed increasing shortness of breath particularly with walking up steps.  He was unaware of any change in his rhythm.  He has not had any EtOH since November 01, 2016.  He also quit tobacco.  Over the past several months he is tried hard to improve his diet with reference to his diabetes and his hemoglobin A1c had reduced from 8.5 to 6.2.  During my evaluation, he was found to be in atrial fibrillation of questionable duration.  He was unaware of being in the abnormal  rhythm but when informed of being in atrial fibrillation he felt this correlated with his recent development of shortness of breath.  During that evaluation I recommended he defer his elective shoulder surgery and initiated amiodarone 200 mg twice a day.  He was continuing to take Eliquis 5 mg twice a day.  We further recommended that he either decrease the dose or discontinue his muscle spasm medication due to potential amiodarone interaction.  He underwent an echo Doppler study on February 28, 2019.  EF was 55 to 60% and there was moderate LVH.  He had mild dilation of his left atrium with moderate dilation of his right atrium.  There was mild MR, mild TR.  He had moderately elevated pulmonary artery systolic pressure at 81.4 millimeters of mercury.  At the time of my initial evaluation we discussed the possibility of evaluating him for sleep apnea.  He does not want to go to the lab for study.  He admits that his sleep is suboptimal.  There is snoring.  He continues to note his heart rate irregularity.    When I saw him in follow-up evaluation on March 13, 2019, he was still in atrial fibrillation with an improved ventricular rate at 79 bpm.   I last saw him on May 21, 2019 at which time he was on metoprolol tartrate 100 mg twice a day.  and losartan 50 mg daily and blood pressure was controlled.Marland Kitchen  He  continued to be on amiodarone 200 mg twice a day and returns today for follow-up evaluation.  He still notes irregularity to heart rate.  He has experienced swelling in his legs bilaterally.  In retrospect he believes he may have been in atrial fibrillation since October.  During that evaluation I scheduled him for DC cardioversion study.  He underwent a failed attempt at DC cardioversion by Dr. Debara Pickett on April 26, 2019.  Subsequently he was evaluated in the office on May 02, 2019 by Coletta Memos and referred for EP evaluation.  He was seen by Dr. Curt Bears on May 15, 2019 who felt he would  likely benefit from a repeat cardioversion attempt and if necessary use of a double defibrillator cardioversion.  Several days later on May 18, 2019 he underwent successful cardioversion at 52 J with restoration of sinus rhythm.  I saw Mr. Guardia in follow-up on June 14, 2019.  At that time he felt well.  He was planning to defer his shoulder surgery until November since he was planning to care for his mother's yard and he would not be able to do this if he had his shoulder surgery in the very near future.  He denies any chest pain or shortness of breath.    Since I last saw him he has had significant issues  with development of sepsis leading to hospitalization in May 2021.  He developed acute kidney injury and also had a right foot infection treated with antibiotics requiring debridement.  During that time his home Lopressor was changed to Toprol and his ARB was stopped secondary to hyperkalemia.  Since his hospitalization he had had issues with increasing shortness of breath and leg edema.  He was requiring increased Lasix regimen which was titrated to 80 mg.  He was seen by Almyra Deforest, PA on September 05, 2019 at which time potassium was 6.1.  He was felt to be still volume overloaded.  He was [facility] with [facility] for 2 days.    He was rehospitalized from June 10 through September 28, 2019 with sepsis secondary to purulent left lower extremity cellulitis.  His hospital course was complicated by acute kidney injury and his creatinine had increased to 7.49.  He was followed by nephrology and underwent a renal biopsy on September 26, 2019.  He is now followed by Dr. Vanetta Mulders.  Cellulitis improved with antibiotics and there was no evidence for abscess or osteomyelitis.  An echocardiogram was done on September 17, 2019 which showed  an EF of 60 to 65%.  The LV was moderately dilated and apparently he had normal diastolic parameters.  There was mild left atrial dilatation.  During his hospitalization metoprolol was  discontinued due to bradycardia and he was maintained on amiodarone for his PAF.  He continued to be on Eliquis.  He was anemic and was transfused with 1 units of packed red blood cells.  Since his hospital discharge, he has been followed by Dr. Vanetta Mulders.  Apparently his dose of Eliquis was reduced to 2.5 twice daily.  His renal function significantly improved to 2.64 on September 10.  He has received several iron injections and also has been undergoing additional erythropoietin treatment for his anemia by Dr. Moshe Cipro in light of his kidney disease.  He tells me his most recent hemoglobin drawn by her had increased to 12 as result of his treatment.  He is unaware of recurrent atrial fibrillation.  He continues to have issues with leg swelling and wraps his legs bilaterally.  He presents for evaluation.  Past Medical History:  Diagnosis Date  . Atrial fibrillation ([place])   . Cellulitis and abscess of foot 07/2019   RIGHT FOOT  . Charcot's joint of right foot   . DDD (degenerative disc disease), lumbar   . Diabetes mellitus without complication ([place])   . Fatty liver   . Hypertension   . Obesity   . Rotator cuff disorder   . Shoulder impingement, right   . Spinal stenosis at L4-L5 level     Past Surgical History:  Procedure Laterality Date  . AMPUTATION Left 10/02/2014   Procedure: Left Third toe amputation ;  Surgeon: [name], MD;  Location: [place];  Service: Orthopedics;  Laterality: Left;  Regular bed, wants to follow hip  . ANTERIOR CRUCIATE LIGAMENT REPAIR Right 90   reconstruction  . APPLICATION OF WOUND VAC Left 10/02/2014   Procedure: APPLICATION OF WOUND VAC; toe Surgeon: [name], MD;  Location: [place];  Service: Orthopedics;  Laterality: Left;  . CARDIOVERSION N/A 04/26/2019   Procedure: CARDIOVERSION;  Surgeon: Pixie Casino, MD;  Location: [facility] ENDOSCOPY;  Service: Cardiovascular;  Laterality: N/A;  . CARDIOVERSION N/A 05/18/2019   Procedure: CARDIOVERSION  (CATH LAB);  Surgeon: Constance Haw, MD;  Location: [place] CV LAB;  Service:  Cardiovascular;  Laterality: N/A;  . I & D EXTREMITY Left 10/05/2014   Procedure: IRRIGATION AND DEBRIDEMENT LEFT FOOT;  Surgeon: [name], MD;  Location: [place];  Service: Orthopedics;  Laterality: Left;  . INCISION AND DRAINAGE Right 08/09/2019   Procedure: INCISION AND DRAINAGE RIGHT FOOT;  Surgeon: Trula Slade, DPM;  Location: [place];  Service: Podiatry;  Laterality: Right;  . KNEE ARTHROSCOPY W/ ACL RECONSTRUCTION Right   . TOTAL KNEE ARTHROPLASTY Right 03/28/2015  . TOTAL KNEE ARTHROPLASTY Right 03/28/2015   Procedure: RIGHT TOTAL KNEE ARTHROPLASTY;  Surgeon: [name], MD;  Location: [place];  Service: Orthopedics;  Laterality: Right;  . WOUND DEBRIDEMENT Right 08/14/2019   Procedure: DEBRIDEMENT WOUND;  Surgeon: Trula Slade, DPM;  Location: [place];  Service: Podiatry;  Laterality: Right;    Current Medications: Outpatient Medications Prior to Visit  Medication Sig Dispense Refill  . Accu-Chek FastClix Lancets MISC Use as directed to check blood sugar at least twice daily. E11.8 E11.65 Z79.4 (Patient taking differently: 1 each by Other route See admin instructions. Use as directed to check blood sugar at least twice daily. E11.8 E11.65 Z79.4) 102 each 11  . ACCU-CHEK GUIDE test strip USE AS DIRECTED AT LEAST TWO TIMES A DAY 100 strip 11  . allopurinol (ZYLOPRIM) 100 MG tablet Take 0.5 tablets (50 mg total) by mouth daily. 15 tablet 6  . amLODipine (NORVASC) 10 MG tablet TAKE ONE TABLET BY MOUTH DAILY 90 tablet 1  . atorvastatin (LIPITOR) 20 MG tablet Take 1 tablet (20 mg total) by mouth daily. 90 tablet 1  . collagenase (SANTYL) ointment Apply 1 application topically daily. 15 g 0  . furosemide (LASIX) 80 MG tablet Take 1 tablet (80 mg total) by mouth 2 (two) times daily. 60 tablet 1  . gabapentin (NEURONTIN) 300 MG capsule Take 1 capsule (300 mg total) by mouth 2 (two) times daily. 180  capsule 1  . insulin glargine (LANTUS SOLOSTAR) 100 UNIT/ML Solostar Pen Inject 48 Units into the skin 2 (two) times daily. 30 mL 6  . insulin lispro (INSULIN LISPRO) 100 UNIT/ML KwikPen Junior Inject 0.08 mLs (8 Units total) into the skin 3 (three) times daily. (Patient taking differently: Inject 8 Units into the skin 3 (three) times daily as needed (Low blood sugar). ) 30 mL 6  . Insulin Pen Needle (B-D ULTRAFINE III SHORT PEN) 31G X 8 MM MISC 1 each by Does not apply route 3 (three) times daily. 100 each 5  . Insulin Syringe-Needle U-100 (TRUEPLUS INSULIN SYRINGE) 30G X 5/16" 0.5 ML MISC Use as directed 3 times daily (Patient taking differently: 1 each by Other route See admin instructions. Use as directed 3 times daily) 100 each 5  . levothyroxine (SYNTHROID) 75 MCG tablet Take 1 tablet (75 mcg total) by mouth daily. 90 tablet 3  . liraglutide (VICTOZA) 18 MG/3ML SOPN Inject 1.8 mg into the skin daily. 30 mL 6  . Misc. Devices MISC Extra-large bedside commode. Dx left leg cellulitis 1 each 0  . oxyCODONE-acetaminophen (PERCOCET) 10-325 MG tablet Take 1 tablet by mouth every 4 (four) hours as needed for pain.     . polyethylene glycol (MIRALAX / GLYCOLAX) 17 g packet Take 17 g by mouth daily as needed for mild constipation. 14 each 0  . Sennosides (EX-LAX) 15 MG TABS Take 15 mg by mouth daily as needed (Constipation).    . sodium chloride (OCEAN) 0.65 % SOLN nasal spray Place 1 spray into  both nostrils daily as needed for congestion.     Karen Chafe INSULIN SYRINGE 30G X 5/16" 0.5 ML MISC USE AS DIRECTED 3 TIMES DAILY (Patient taking differently: 1 each by Other route in the morning, at noon, and at bedtime. ) 100 each 0  . amiodarone (PACERONE) 200 MG tablet Take 200 mg by mouth 2 (two) times daily.     Marland Kitchen apixaban (ELIQUIS) 2.5 MG TABS tablet Take 1 tablet (2.5 mg total) by mouth 2 (two) times daily. 60 tablet   . hydrALAZINE (APRESOLINE) 50 MG tablet TAKE ONE TABLET BY MOUTH EVERY 8 HOURS 90  tablet 1  . epoetin alfa-epbx (RETACRIT) injection 20,000 Units      No facility-administered medications prior to visit.     Allergies:   Metformin and related   Social History   Socioeconomic History  . Marital status: Single    Spouse name: Not on file  . Number of children: Not on file  . Years of education: Not on file  . Highest education level: Not on file  Occupational History  . Occupation: Management consultant  Tobacco Use  . Smoking status: Former Smoker    Packs/day: 0.00    Years: 38.00    Pack years: 0.00    Types: Cigarettes  . Smokeless tobacco: Never Used  . Tobacco comment: QUIT 10/2016  Vaping Use  . Vaping Use: Never used  Substance and Sexual Activity  . Alcohol use: No    Alcohol/week: 5.0 - 6.0 standard drinks    Types: 5 - 6 Cans of beer per week  . Drug use: Yes    Types: Marijuana    Comment: last week ; denies 03/24/17  . Sexual activity: Not on file  Other Topics Concern  . Not on file  Social History Narrative   Lives alone.   Social Determinants of Health   Financial Resource Strain:   . Difficulty of Paying Living Expenses: Not on file  Food Insecurity:   . Worried About Charity fundraiser in the Last Year: Not on file  . Ran Out of Food in the Last Year: Not on file  Transportation Needs:   . Lack of Transportation (Medical): Not on file  . Lack of Transportation (Non-Medical): Not on file  Physical Activity:   . Days of Exercise per Week: Not on file  . Minutes of Exercise per Session: Not on file  Stress:   . Feeling of Stress : Not on file  Social Connections:   . Frequency of Communication with Friends and Family: Not on file  . Frequency of Social Gatherings with Friends and Family: Not on file  . Attends Religious Services: Not on file  . Active Member of Clubs or Organizations: Not on file  . Attends Archivist Meetings: Not on file  . Marital Status: Not on file    Socially, never married.  He  has no children.  He is currently living with his mother.  He is not working.  Previously had done jobs.  Family History:  The patient's family history includes Diabetes in his father; Heart failure in his father; Hypertension in his father.  His father died at age 11.  Mother is still living at age 105.  ROS General: Negative; No fevers, chills, or night sweats;  HEENT: Negative; No changes in vision or hearing, sinus congestion, difficulty swallowing Pulmonary: Negative; No cough, wheezing, shortness of breath, hemoptysis Cardiovascular: See HPI GI: Negative; No nausea,  vomiting, diarrhea, or abdominal pain GU: Positive for recent AKA Musculoskeletal: Chronic pain due to spinal stenosis and Charcot foot;  recent right foot wound infection leading to sepsis Hematologic/Oncology: Negative; no easy bruising, bleeding Endocrine: Positive for diabetes Neuro: Negative; no changes in balance, headaches Skin: Negative; No rashes or skin lesions Psychiatric: Negative; No behavioral problems, depression Sleep: Positive for snoring, daytime sleepiness, hypersomnolence, bruxism, restless legs, hypnogognic hallucinations, no cataplexy Other comprehensive 14 point system review is negative.   PHYSICAL EXAM:   BP (!) 146/68   Pulse 79   Ht 6' 2"  (1.88 m)   Wt (!) 353 lb 3.2 oz (160.2 kg)   SpO2 97%   BMI 45.35 kg/m    Repeat blood pressure by me 142/70  Wt Readings from Last 3 Encounters:  02/16/20 (!) 353 lb 3.2 oz (160.2 kg)  01/23/20 (!) 343 lb (155.6 kg)  10/11/19 (!) 343 lb (155.6 kg)    General: Alert, oriented, no distress.  Skin: normal turgor, no rashes, warm and dry HEENT: Normocephalic, atraumatic. Pupils equal round and reactive to light; sclera anicteric; extraocular muscles intact;  Nose without nasal septal hypertrophy Mouth/Parynx benign; Mallinpatti scale 3 Neck: Thick neck no JVD, no carotid bruits; normal carotid upstroke Lungs: clear to ausculatation and percussion;  no wheezing or rales Chest wall: without tenderness to palpitation Heart: PMI not displaced, RRR, s1 s2 normal, 1/6 systolic murmur, no diastolic murmur, no rubs, gallops, thrills, or heaves Abdomen: Central adiposity;soft, nontender; no hepatosplenomehaly, BS+; abdominal aorta nontender and not dilated by palpation. Back: no CVA tenderness Pulses 2+ Musculoskeletal: full range of motion, normal strength, no joint deformities Extremities: Both legs are wrapped  to below the knees no clubbing cyanosis , Homan's sign negative  Neurologic: grossly nonfocal; Cranial nerves grossly wnl Psychologic: Normal mood and affect  Studies/Labs Reviewed:   EKG:  EKG is ordered today.  ECG (independently read by me): NSR at 79; first-degree AV block, PR 208 msec; QTc474 msec  June 14, 2019 ECG (independently read by me): Normal sinus rhythm at 69; First degree AV block; PR 220 msec; Mild RV conduction delay.  April 20, 2019 ECG (independently read by me): Atrial fibrillation at 77 bpm.  QTc interval 450 ms  March 13, 2019 ECG (independently read by me): Atrial fibrillation at 79 bpm.  Incomplete right bundle branch block.  QTc interval 449 ms.  February 13, 2019 ECG (independently read by me): Atrial fibrillation at 86 bpm; QTc 440 msec  October 2019 ECG (independently read by me): Sinus rhythm at 71 bpm.  No ectopy.  Normal intervals.  March 2019 ECG (independently read by me): normal sinus rhythm at 88 bpm. QTc interval 433 ms.  December 2018 ECG (independently read by me): Sinus rhythm in the 90s with PACs.  QTc interval 442 ms.  Recent Labs: BMP Latest Ref Rng & Units 02/16/2020 02/16/2020 01/05/2020  Glucose 70 - 99 mg/dL 178(H) - 103(H)  BUN 6 - 20 mg/dL 40(H) - 45(H)  Creatinine 0.61 - 1.24 mg/dL 2.35(H) - 2.67(H)  BUN/Creat Ratio 9 - 20 - - -  Sodium 135 - 145 mmol/L 138 - 139  Potassium 3.5 - 5.1 mmol/L 4.1 - 4.1  Chloride 98 - 111 mmol/L 103 - 105  CO2 22 - 32 mmol/L 25 - 23    Calcium 8.7 - 10.2 mg/dL 9.4 9.2 9.6     Hepatic Function Latest Ref Rng & Units 02/16/2020 01/05/2020 12/08/2019  Total Protein 6.5 - 8.1 g/dL - - -  Albumin 3.5 - 5.0 g/dL 3.8 4.1 3.7  AST 15 - 41 U/L - - -  ALT 0 - 44 U/L - - -  Alk Phosphatase 38 - 126 U/L - - -  Total Bilirubin 0.3 - 1.2 mg/dL - - -  Bilirubin, Direct 0.0 - 0.2 mg/dL - - -    CBC Latest Ref Rng & Units 02/16/2020 01/19/2020 01/05/2020  WBC 4.0 - 10.5 K/uL - - -  Hemoglobin 13.0 - 17.0 g/dL 10.5(L) 11.8(L) 11.6(L)  Hematocrit 39 - 52 % - - -  Platelets 150 - 400 K/uL - - -   Lab Results  Component Value Date   MCV 83.7 10/12/2019   MCV 88.6 10/01/2019   MCV 88.2 09/28/2019   Lab Results  Component Value Date   TSH 7.370 (H) 09/19/2019   Lab Results  Component Value Date   HGBA1C 6.4 01/23/2020     BNP    Component Value Date/Time   BNP 132.6 (H) 10/01/2019 1655    ProBNP No results found for: PROBNP   Lipid Panel     Component Value Date/Time   CHOL 157 04/04/2018 1103   TRIG 256 (H) 04/04/2018 1103   HDL 42 04/04/2018 1103   CHOLHDL 3.7 04/04/2018 1103   CHOLHDL 3.8 04/10/2010 0315   VLDL 32 04/10/2010 0315   LDLCALC 64 04/04/2018 1103     RADIOLOGY: No results found.   Additional studies/ records that were reviewed today include:  I reviewed the patient's atrial fibrillation clinic records as well as his ER visits.  I reviewed his most recent echo Doppler study and nuclear perfusion studies of August 2019  ------------------------------------------------------------------- 08/18/2016 ECHO Study Conclusions  - Left ventricle: The cavity size was mildly dilated. Wall   thickness was increased in a pattern of mild LVH. Systolic   function was normal. The estimated ejection fraction was in the   range of 50% to 55%. Wall motion was normal; there were no   regional wall motion abnormalities. Doppler parameters are   consistent with abnormal left ventricular relaxation (grade  1   diastolic dysfunction). - Aortic valve: Transvalvular velocity was within the normal range.   There was no stenosis. There was no regurgitation. - Mitral valve: Transvalvular velocity was within the normal range.   There was no evidence for stenosis. There was trivial   regurgitation. - Left atrium: The atrium was moderately dilated. - Right ventricle: The cavity size was normal. Wall thickness was   normal. Systolic function was normal. - Tricuspid valve: There was no regurgitation.  ------------------------------------------------------------------- August 2019  [facility] Conclusions  - Left ventricle: The cavity size was moderately dilated. Wall   thickness was normal. Systolic function was normal. The estimated   ejection fraction was in the range of 55% to 60%. Wall motion was   normal; there were no regional wall motion abnormalities. Doppler   parameters are consistent with abnormal left ventricular   relaxation (grade 1 diastolic dysfunction). - Right ventricle: The cavity size was moderately dilated. Wall   thickness was normal. - Pulmonary arteries: Systolic pressure was mildly increased. PA   peak pressure: 33 mm Hg (S).   Gated SPECT myocardial perfusion study Highlights    Normal perfusion No ischemia or scar  LVEF is calculated at 48% Visual estimate suggests LVEF is greater Consider echo to confirm  The study is normal.  There was no ST segment deviation noted during stress.   ECHO 02/28/2019 IMPRESSIONS  1. Left ventricular  ejection fraction, by visual estimation, is 55 to 60%. The left ventricle has normal function. There is moderately increased left ventricular hypertrophy.  2. Left ventricular diastolic function could not be evaluated.  3. Global right ventricle has mildly reduced systolic function.The right ventricular size is normal. No increase in right ventricular wall thickness.  4. Left atrial size was mildly dilated.  5. Right atrial size  was moderately dilated.  6. The mitral valve is normal in structure. Mild mitral valve regurgitation. No evidence of mitral stenosis.  7. The tricuspid valve is normal in structure. Tricuspid valve regurgitation is mild.  8. The aortic valve is tricuspid. Aortic valve regurgitation is not visualized. No evidence of aortic valve sclerosis or stenosis.  9. The pulmonic valve was normal in structure. Pulmonic valve regurgitation is not visualized. 10. Moderately elevated pulmonary artery systolic pressure. 11. The atrial septum is grossly normal.   ECHO 09/18/2019 IMPRESSIONS  1. Normal LV function; moderate LVE; mild LAE.  2. Left ventricular ejection fraction, by estimation, is 60 to 65%. The  left ventricle has normal function. The left ventricle has no regional  wall motion abnormalities. The left ventricular internal cavity size was  moderately dilated. Left ventricular  diastolic parameters were normal.  3. Right ventricular systolic function is normal. The right ventricular  size is normal.  4. Left atrial size was mildly dilated.  5. The mitral valve is normal in structure. No evidence of mitral valve  regurgitation. No evidence of mitral stenosis.  6. The aortic valve is normal in structure. Aortic valve regurgitation is  not visualized. No aortic stenosis is present.  7. The inferior vena cava is normal in size with greater than 50%  respiratory variability, suggesting right atrial pressure of 3 mmHg.   ASSESSMENT:    1. Paroxysmal atrial fibrillation (HCC)   2. Essential hypertension   3. Acute on chronic diastolic congestive heart failure ([place])   4. AKI (acute kidney injury) ([place])   5. Alteration in anticoagulation   6. Encounter for monitoring amiodarone therapy   7. Bilateral leg edema   8. Morbid obesity [facility])     PLAN:  Mr. Jamion Carter is a 59 year old gentleman who has a long-standing history of obesity, and has been on diabetic medication since 2016.   In addition, has a history of hypertension and hyperlipidemia.  He previously had significant EtOH history and his 2 episodes of atrial fibrillation in 2018 occurred after significant alcohol binging.  He is not had any alcohol since his last cardioversion in August 2018 and also at that time quit tobacco.  An echo Doppler study in August 2019 continued to show normal systolic function with EF at 55 to 60%.  There was evidence for grade 1 diastolic dysfunction.  There was mild pulmonary hypertension with a PA systolic pressure 33 mm.  He has had significant improvement with his diabetes with recent hemoglobin A1c being reduced from 8.5 down to 6.2.  He has been on losartan 50 mg, metoprolol 100 mg twice a day for blood pressure control.  He was seen by me for preoperative clearance prior to undergoing elective shoulder surgery on February 13 2019.  At that time he was in atrial fibrillation.  Retrospectively he believes this may have occurred in October since for a month he had been noticing some exertional dyspnea.  He follow-up echo Doppler study on February 28, 2019 showed an EF of 55 to 60%, moderate LVH, biatrial enlargement, and since he was in AF  diastolic dysfunction could not be evaluated.  He had moderate pulmonary hypertension with estimated PA pressure at 41.6.  He has been on Eliquis since November 2020 during my initial evaluation.  I strongly recommended a sleep study evaluation in light of my high index of specific suspicion for obstructive sleep apnea.  His cardioversion in April 25, 2018 was unsuccessful but ultimately he has been in sinus rhythm since a repeat cardioversion on February 18 was done at [facility] [address].  Since his last evaluation with me he has had difficulty with volume overload, as well as acute kidney injury and has had several hospitalizations with his most recent hospitalization 3 weeks in June associated with sepsis from a right lower extremity wound infection, acute kidney injury with  creatinine increasing to 7.49 and significant anemia requiring unit of packed red blood cell transfusion, 2 infusions of iron, as well as erythropoietin injections at several week intervals.  He is followed closely by Dr. Vanetta Mulders.  Fortunately, he has been maintaining sinus rhythm.  However, presently he is on a reduced dose of Eliquis.  Based on his age, and weight, optimal dosing for him should be 5 mg twice a day.  I discussed this with our pharmacist as well and they concurred and as result I have instructed that he increase his Eliquis back to 5 mg twice daily.  He continues to be on amiodarone 200 mg twice a day.  I have suggested slight reduction down to 300 mg daily.  He had been taken off metoprolol during his hospitalization due to bradycardia and I will not start this presently but this may need to be reinstituted in the future as potentially we can reduce his amiodarone further.  His blood pressure today is stable on amlodipine 10 mg and he continues to take furosemide 80 mg twice a day.  He is supposed to be on hydralazine 50 mg every 8 hours but he finds it very difficult to take the afternoon dose.  As result I have suggested he change his hydralazine to 75 mg twice a day.  He is diabetic on insulin.  He has had elevated TSH on amiodarone.  Levothyroxine present regimen is 75 mcg.  He will be following up with nephrology fairly soon as well as Dr. Dellia Nims for wound care.  In the future I will again stress an evaluation for sleep apnea.  I will see him in 3 months for follow-up evaluation.   Medication Adjustments/Labs and Tests Ordered: Current medicines are reviewed at length with the patient today.  Concerns regarding medicines are outlined above.  Medication changes, Labs and Tests ordered today are listed in the Patient Instructions below. Patient Instructions  Medication Instructions:  DECREASE AMIODARONE 300MG DAILY  TAKE HYDRALAZINE 75 MG(1-1/2 TAN) TWICE DAILY  INCREASE  ELIQUIS 5MG TWICE DAILY  *If you need a refill on your cardiac medications before your next appointment, please call your pharmacy*  Lab Work:   Testing/Procedures:  NONE    NONE  Follow-Up: Your next appointment:  3 month(s) In Person with Shelva Majestic, MD   At [facility], you and your health needs are our priority.  As part of our continuing mission to provide you with exceptional heart care, we have created designated Provider Care Teams.  These Care Teams include your primary Cardiologist (physician) and Advanced Practice Providers (APPs -  Physician Assistants and Nurse Practitioners) who all work together to provide you with the care you need, when you need it.  We  recommend signing up for the patient portal called "MyChart".  Sign up information is provided on this After Visit Summary.  MyChart is used to connect with patients for Virtual Visits (Telemedicine).  Patients are able to view lab/test results, encounter notes, upcoming appointments, etc.  Non-urgent messages can be sent to your provider as well.   To learn more about what you can do with MyChart, go to [email or web address].       Signed, Shelva Majestic, MD  02/17/2020 6:09 PM    [place] [address], [place], [place], [place]  [address] Phone: [phone]

## 2020-02-17 ENCOUNTER — Encounter: Payer: Self-pay | Admitting: Cardiovascular Disease

## 2020-02-17 LAB — PTH, INTACT AND CALCIUM
Calcium, Total (PTH): 9.2 mg/dL (ref 8.7–10.2)
PTH: 98 pg/mL — ABNORMAL HIGH (ref 15–65)

## 2020-02-20 DIAGNOSIS — G890 Central pain syndrome: Secondary | ICD-10-CM | POA: Diagnosis not present

## 2020-02-20 DIAGNOSIS — M542 Cervicalgia: Secondary | ICD-10-CM | POA: Diagnosis not present

## 2020-02-20 DIAGNOSIS — M5412 Radiculopathy, cervical region: Secondary | ICD-10-CM | POA: Diagnosis not present

## 2020-02-20 DIAGNOSIS — M25552 Pain in left hip: Secondary | ICD-10-CM | POA: Diagnosis not present

## 2020-02-20 DIAGNOSIS — M25571 Pain in right ankle and joints of right foot: Secondary | ICD-10-CM | POA: Diagnosis not present

## 2020-02-20 DIAGNOSIS — E1143 Type 2 diabetes mellitus with diabetic autonomic (poly)neuropathy: Secondary | ICD-10-CM | POA: Diagnosis not present

## 2020-02-20 DIAGNOSIS — M545 Low back pain, unspecified: Secondary | ICD-10-CM | POA: Diagnosis not present

## 2020-02-20 DIAGNOSIS — M25512 Pain in left shoulder: Secondary | ICD-10-CM | POA: Diagnosis not present

## 2020-02-20 DIAGNOSIS — M25511 Pain in right shoulder: Secondary | ICD-10-CM | POA: Diagnosis not present

## 2020-02-20 DIAGNOSIS — Z6841 Body Mass Index (BMI) 40.0 and over, adult: Secondary | ICD-10-CM | POA: Diagnosis not present

## 2020-02-23 ENCOUNTER — Encounter (HOSPITAL_COMMUNITY): Payer: Medicaid Other

## 2020-02-29 ENCOUNTER — Encounter (HOSPITAL_BASED_OUTPATIENT_CLINIC_OR_DEPARTMENT_OTHER): Payer: Medicaid Other | Attending: Internal Medicine | Admitting: Internal Medicine

## 2020-02-29 ENCOUNTER — Other Ambulatory Visit: Payer: Self-pay

## 2020-02-29 DIAGNOSIS — E11621 Type 2 diabetes mellitus with foot ulcer: Secondary | ICD-10-CM | POA: Insufficient documentation

## 2020-02-29 DIAGNOSIS — E1122 Type 2 diabetes mellitus with diabetic chronic kidney disease: Secondary | ICD-10-CM | POA: Diagnosis not present

## 2020-02-29 DIAGNOSIS — I129 Hypertensive chronic kidney disease with stage 1 through stage 4 chronic kidney disease, or unspecified chronic kidney disease: Secondary | ICD-10-CM | POA: Insufficient documentation

## 2020-02-29 DIAGNOSIS — T8131XA Disruption of external operation (surgical) wound, not elsewhere classified, initial encounter: Secondary | ICD-10-CM | POA: Diagnosis not present

## 2020-02-29 DIAGNOSIS — L97512 Non-pressure chronic ulcer of other part of right foot with fat layer exposed: Secondary | ICD-10-CM | POA: Insufficient documentation

## 2020-02-29 DIAGNOSIS — N189 Chronic kidney disease, unspecified: Secondary | ICD-10-CM | POA: Diagnosis not present

## 2020-02-29 DIAGNOSIS — L97218 Non-pressure chronic ulcer of right calf with other specified severity: Secondary | ICD-10-CM | POA: Insufficient documentation

## 2020-02-29 DIAGNOSIS — E11622 Type 2 diabetes mellitus with other skin ulcer: Secondary | ICD-10-CM | POA: Diagnosis not present

## 2020-02-29 DIAGNOSIS — L97212 Non-pressure chronic ulcer of right calf with fat layer exposed: Secondary | ICD-10-CM | POA: Diagnosis not present

## 2020-02-29 NOTE — Progress Notes (Signed)
Jeffery [name] ([id number]) . Visit Report for 02/29/2020 HPI Details Patient Name: Date of Service: Jeffery Porter [name]. 02/29/2020 9:15 A M Medical Record Number: [id number] Patient Account Number: [id number] Date of Birth/Sex: Treating RN: July 14, 1960 (59 y.o. [name] Primary Care Provider: [name] Other Clinician: Referring Provider: Treating Provider/Extender: [name] Weeks in Treatment: 18 History of Present Illness HPI Description: ADMISSION 10/23/2019 This is a complex 59 year old man who is here with wounds on his bilateral lower legs feet and abrasion on his left forearm. These are generally of different etiology. He is a type II diabetic with peripheral neuropathy but does not have known PAD. Looking through [facility] health link I can see he was being followed by Dr. Earleen Newport earlier this year for a diabetic ulcer on the right foot. An MRI done in May showed no osteomyelitis. Shortly thereafter he developed cellulitis of the right leg and foot and was hospitalized from 5/10 through 5/19 with MSSA sepsis. During this hospitalization he was felt to have osteomyelitis he had debridement twice of the right foot and on one occasion apparently a placement of ACell. He again was hospitalized from 6/10 through 7/1 again with left lower extremity cellulitis increasing edema and acute renal failure. An MRI showed left leg cellulitis but no other major findings. 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 He was discharged with multiple wounds on his legs which apparently started as blisters secondary to fluid overload. His creatinine on 7/5 was 7.49 potassium 4 BUN 116 CO2 of 23 albumin of 2.9. He was seen by Dr. Moshe Cipro of nephrology medications were adjusted she is following him. 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 The patient arrives in clinic today with  multiple wounds of different etiologies on his bilateral lower extremities. On the right leg he has superficial areas on the right medial and lateral.. On his right lateral foot I think the original surgical wound. There is a small area at the base of his left second toe. On the left lower extremity a fairly large wound on the left second toe which was trauma from the shower door. He has several areas on the legs. Which are superficial. Finally he has an abrasion injury on his left arm/skin tear Past medical history includes Charcot foot. Left third toe amputation, type 2 diabetes with peripheral neuropathy, paroxysmal A. fib on Eliquis, diastolic heart failure, acute on chronic renal failure as described. ABI in our clinic was 1.18 on the right 1.19 on the left 11/03/2019 upon evaluation today patient appears to be doing well with regard to his ulcers in general. He is making good progress. The worst is his larger right lateral foot ulcer. This wound is going require some sharp debridement today. Other than that he seems to be doing quite well. 11/10/19-Patient has denuded areas on both legs especially the right where there is more stasis changes and friable skin, areas on his right leg appear to be bigger than before, right lateral foot wound appears about the same. We are using Hydrofera Blue to the foot wound and silver alginate on the rest with 4 layer compression 8/19; patient's wounds on his bilateral anterior lower legs look a lot better. These appear to be progressing towards healing and some of them actually have healed. The most substantial area is on the right lateral foot. I thought this was his original surgical wound site.  This has raised hyper granulation tissue and a completely nonviable surface. We have been using silver alginate to all the wounds on his legs under compression and Hydrofera Blue on the right lateral 8/27; most of the wounds are improving here except for the one on the  right lateral foot. Still hyper granulation were using Hydrofera Blue in this area.he does not have an arterial issue. The wound on the right lateral foot I think was an original surgical wound. I wonder whether he inverts at the ankle and not to complicate healing this. In review E does not have an arterial issue.MRI of the foot did not show osteomyelitis in May He still has several open wounds on the bilateral lower legs although these are a lot better. We are using silver alginate under for R compression 9/10; right lateral foot wound which is a surgical wound is still open. He still has bilateral small wounds question venous lymphedema. He developed a new one in the posterior left calf. We have his right leg in compression he has been wearing a compression garment on the left leg. We are going to wrap both legs today. He does not have an arterial issue. MRI did not suggest osteomyelitis of the right foot 9/17; right lateral foot wound still hyper granulated. He has superficial areas on the right anterior lower extremity which are a bit worse this week. There is nothing open on the left leg but the swelling on the left leg is worse than the right this looks like chronic venous insufficiency. We ordered him juxta lite stockings but he still has not received them. He tells me that his creatinine is improving he follows with nephrology. 9/24; right lateral foot. The wound still has a very adherent necrotic surface although I am able to debride this to help something healthy. The area on the right anterior leg in the setting of chronic venous insufficiency and lymphedema is superficial and looks healthy. We will continue with Hydrofera Blue on the foot silver alginate on the leg. We could not get wraparound stockings because he has home health. They are changing the dressing. Home health will not wrap the left leg because he does not have a wound. 10/8; right lateral foot the wound is smaller. Healthy  looking surface no debridement is required. He does have a juxta lite stocking for the left leg there is no open wound here but he does have chronic lymphedema and chronic venous insufficiency with stasis dermatitis looking changes. We will continue to wrap his right leg 10/22; right lateral foot slightly smaller debris on the surface. Comes in today with a new area on the right fourth toe over the DIP. I am not precisely sure how this happened. It looks like a foot wear injury although he denies this states he never wears shoes at home and he only wears them when he goes out to appointments. He says he took his sock off and noticed blood there. He has a juxta lite stocking for the left leg we have been wrapping the right 11/5; right lateral foot slightly smaller does not have any debris on the surface. The area over the right dorsal fourth toe has a completely nonviable surface. We have been using Hydrofera Blue in both areas he has a juxta lite stocking on the right left leg and were wrapping the right 11/18; right lateral foot slightly smaller healthy looking wound surface. This is original surgical site. He comes in today with a new area on  the right posterior calf probably a wrap injury. Necrotic surface of the required debridement. Also concerning is the amount of edema in the left leg. There is no open wounds here he has been using a juxta lite stocking but I am just not sure after discussing things with him how reliable he is with it 12/2; the right lateral fourth toe is closed although I am not exactly happy with the surface. He is wearing the juxta lite on the left leg no open wounds per his description we did not check this Using Hydrofera Blue on the linear area on the right posterior calf distally. This seems to be better Electronic Signature(s) Signed: 02/29/2020 12:52:04 PM By: Linton Ham MD Entered By: Linton Ham on 02/29/2020  10:09:47 -------------------------------------------------------------------------------- Physical Exam Details Patient Name: Date of Service: Jeffery Porter [name]. 02/29/2020 9:15 A M Medical Record Number: [id number] Patient Account Number: [id number] Date of Birth/Sex: Treating RN: [birth year] (59 y.o. [name] Primary Care Provider: [name] Other Clinician: Referring Provider: Treating Provider/Extender: [name] Weeks in Treatment: 18 Constitutional Patient is hypertensive.. Pulse regular and within target range for patient.Marland Kitchen Respirations regular, non-labored and within target range.. Temperature is normal and within the target range for the patient.Marland Kitchen Appears in no distress. Notes Wound exam; the right lateral fourth toe has what appears to be some degree of subdermal hemorrhage. Nevertheless there is no open area here. There is likely trauma from the fifth toe I have asked him to keep this separated and we will keep an eye on this but I did not debride this The area on the right posterior calf has some debris on the surface but given the improvement in dimensions I did not remove this. Electronic Signature(s) Signed: 02/29/2020 12:52:04 PM By: Linton Ham MD Entered By: Linton Ham on 02/29/2020 10:10:56 -------------------------------------------------------------------------------- Physician Orders Details Patient Name: Date of Service: Jeffery Porter [name]. 02/29/2020 9:15 A M Medical Record Number: [id number] Patient Account Number: [id number] Date of Birth/Sex: Treating RN: 1961/03/08 (59 y.o. [name] Primary Care Provider: [name] Other Clinician: Referring Provider: Treating Provider/Extender: [name] in Treatment: 42 Verbal / Phone Orders: No Diagnosis Coding ICD-10 Coding Code Description E11.621 Type 2 diabetes mellitus with foot ulcer T81.31XA Disruption of external  operation (surgical) wound, not elsewhere classified, initial encounter L97.512 Non-pressure chronic ulcer of other part of right foot with fat layer exposed L97.218 Non-pressure chronic ulcer of right calf with other specified severity E11.22 Type 2 diabetes mellitus with diabetic chronic kidney disease Follow-up Appointments Return Appointment in 2 weeks. Bathing/ Shower/ Hygiene May shower with protection but do not get wound dressing(s) wet. - use a cast protector. May shower and wash wound with soap and water. - with dressing changes only. Edema Control - Lymphedema / SCD / Other Elevate legs to the level of the heart or above for 30 minutes daily and/or when sitting, a frequency of: - 3-4 times a day. Avoid standing for long periods of time. Exercise regularly Other Edema Control Orders/Instructions: - Patient to wear 40-53mmHg compression garment juxtalite HD to left leg. Apply in the morning and remove at night. Apply lotion to left leg every night. Additional Orders / Instructions Other: - ***Pad/gauze between right foot 4th and 5th toe.*** Home Health No change in wound care orders this week; continue Home Health for wound care. May utilize formulary equivalent dressing for wound treatment orders unless otherwise specified. [facility] home health change twice  a week Monday and Thursday. Wound Treatment Wound #18 - Lower Leg Wound Laterality: Right, Posterior Cleanser: Wound Cleanser (Home Health) 2 x Per Week/30 Days Discharge Instructions: Cleanse the wound with wound cleanser prior to applying a clean dressing using gauze sponges, not tissue or cotton balls. Peri-Wound Care: Sween Lotion (Moisturizing lotion) (Home Health) 2 x Per Week/30 Days Discharge Instructions: Apply moisturizing lotion as directed Prim Dressing: Hydrofera Blue Classic Foam, 2x2 in (Home Health) 2 x Per Week/30 Days ary Discharge Instructions: Apply to wound bed as instructed. moisten with  saline. Secondary Dressing: Woven Gauze Sponge, Non-Sterile 4x4 in (Home Health) 2 x Per Week/30 Days Discharge Instructions: Apply over primary dressing as directed. Secondary Dressing: ABD Pad, 5x9 (Home Health) 2 x Per Week/30 Days Discharge Instructions: Apply over primary dressing as directed. Compression Wrap: FourPress (4 layer compression wrap) (Home Health) 2 x Per Week/30 Days Discharge Instructions: Apply four layer compression as directed. Wound #7 - Foot Wound Laterality: Right, Lateral Cleanser: Wound Cleanser (Home Health) 2 x Per Week/30 Days Discharge Instructions: Cleanse the wound with wound cleanser prior to applying a clean dressing using gauze sponges, not tissue or cotton balls. Peri-Wound Care: Sween Lotion (Moisturizing lotion) (Home Health) 2 x Per Week/30 Days Discharge Instructions: Apply moisturizing lotion as directed Prim Dressing: Hydrofera Blue Classic Foam, 2x2 in (Home Health) 2 x Per Week/30 Days ary Discharge Instructions: Apply to wound bed as instructed. moisten with saline. Secondary Dressing: Woven Gauze Sponge, Non-Sterile 4x4 in (Home Health) 2 x Per Week/30 Days Discharge Instructions: Apply over primary dressing as directed. Compression Wrap: FourPress (4 layer compression wrap) (Home Health) 2 x Per Week/30 Days Discharge Instructions: Apply four layer compression as directed. Electronic Signature(s) Signed: 02/29/2020 12:52:04 PM By: Linton Ham MD Signed: 02/29/2020 5:54:37 PM By: Deon Pilling Entered By: Deon Pilling on 02/29/2020 10:11:07 -------------------------------------------------------------------------------- Problem List Details Patient Name: Date of Service: [name], [name]. 02/29/2020 9:15 A M Medical Record Number: [id number] Patient Account Number: [id number] Date of Birth/Sex: Treating RN: 1960-12-21 (59 y.o. [name] Primary Care Provider: [name] Other Clinician: Referring Provider: Treating  Provider/Extender: [name] Weeks in Treatment: 18 Active Problems ICD-10 Encounter Code Description Active Date MDM Diagnosis E11.621 Type 2 diabetes mellitus with foot ulcer 10/23/2019 No Yes T81.31XA Disruption of external operation (surgical) wound, not elsewhere classified, 10/23/2019 No Yes initial encounter L97.512 Non-pressure chronic ulcer of other part of right foot with fat layer exposed 11/03/2019 No Yes L97.218 Non-pressure chronic ulcer of right calf with other specified severity 02/15/2020 No Yes E11.22 Type 2 diabetes mellitus with diabetic chronic kidney disease 10/23/2019 No Yes Inactive Problems ICD-10 Code Description Active Date Inactive Date L97.811 Non-pressure chronic ulcer of other part of right lower leg limited to breakdown of skin 10/23/2019 10/23/2019 L97.821 Non-pressure chronic ulcer of other part of left lower leg limited to breakdown of skin 10/23/2019 10/23/2019 S90.812D Abrasion, left foot, subsequent encounter 10/23/2019 10/23/2019 S50.812D Abrasion of left forearm, subsequent encounter 10/23/2019 10/23/2019 Resolved Problems Electronic Signature(s) Signed: 02/29/2020 12:52:04 PM By: Linton Ham MD Entered By: Linton Ham on 02/29/2020 10:08:31 -------------------------------------------------------------------------------- Progress Note Details Patient Name: Date of Service: Jeffery Porter [name]. 02/29/2020 9:15 A M Medical Record Number: [id number] Patient Account Number: [id number] Date of Birth/Sex: Treating RN: 02-19-1961 (59 y.o. [name] Primary Care Provider: [name] Other Clinician: Referring Provider: Treating Provider/Extender: [name] Weeks in Treatment: 18 Subjective History of Present Illness (HPI) ADMISSION 10/23/2019 This  is a complex 59 year old man who is here with wounds on his bilateral lower legs feet and abrasion on his left forearm. These are generally of  different etiology. He is a type II diabetic with peripheral neuropathy but does not have known PAD. Looking through [facility] health link I can see he was being followed by Dr. Earleen Newport earlier this year for a diabetic ulcer on the right foot. An MRI done in May showed no osteomyelitis. Shortly thereafter he developed cellulitis of the right leg and foot and was hospitalized from 5/10 through 5/19 with MSSA sepsis. During this hospitalization he was felt to have osteomyelitis he had debridement twice of the right foot and on one occasion apparently a placement of ACell. He again was hospitalized from 6/10 through 7/1 again with left lower extremity cellulitis increasing edema and acute renal failure. An MRI showed left leg cellulitis but no other major findings. 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 He was discharged with multiple wounds on his legs which apparently started as blisters secondary to fluid overload. His creatinine on 7/5 was 7.49 potassium 4 BUN 116 CO2 of 23 albumin of 2.9. He was seen by Dr. Moshe Cipro of nephrology medications were adjusted she is following him. 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 The patient arrives in clinic today with multiple wounds of different etiologies on his bilateral lower extremities. ooOn the right leg he has superficial areas on the right medial and lateral.. On his right lateral foot I think the original surgical wound. There is a small area at the base of his left second toe. ooOn the left lower extremity a fairly large wound on the left second toe which was trauma from the shower door. He has several areas on the legs. Which are superficial. ooFinally he has an abrasion injury on his left arm/skin tear Past medical history includes Charcot foot. Left third toe amputation, type 2 diabetes with peripheral neuropathy, paroxysmal A. fib on Eliquis, diastolic  heart failure, acute on chronic renal failure as described. ABI in our clinic was 1.18 on the right 1.19 on the left 11/03/2019 upon evaluation today patient appears to be doing well with regard to his ulcers in general. He is making good progress. The worst is his larger right lateral foot ulcer. This wound is going require some sharp debridement today. Other than that he seems to be doing quite well. 11/10/19-Patient has denuded areas on both legs especially the right where there is more stasis changes and friable skin, areas on his right leg appear to be bigger than before, right lateral foot wound appears about the same. We are using Hydrofera Blue to the foot wound and silver alginate on the rest with 4 layer compression 8/19; patient's wounds on his bilateral anterior lower legs look a lot better. These appear to be progressing towards healing and some of them actually have healed. The most substantial area is on the right lateral foot. I thought this was his original surgical wound site. This has raised hyper granulation tissue and a completely nonviable surface. We have been using silver alginate to all the wounds on his legs under compression and Hydrofera Blue on the right lateral 8/27; most of the wounds are improving here except for the one on the right lateral foot. Still hyper granulation were using Hydrofera Blue in this area.he does not have an arterial issue. The wound on the right  lateral foot I think was an original surgical wound. I wonder whether he inverts at the ankle and not to complicate healing this. In review E does not have an arterial issue.MRI of the foot did not show osteomyelitis in May He still has several open wounds on the bilateral lower legs although these are a lot better. We are using silver alginate under for R compression 9/10; right lateral foot wound which is a surgical wound is still open. He still has bilateral small wounds question venous lymphedema. He  developed a new one in the posterior left calf. We have his right leg in compression he has been wearing a compression garment on the left leg. We are going to wrap both legs today. He does not have an arterial issue. MRI did not suggest osteomyelitis of the right foot 9/17; right lateral foot wound still hyper granulated. He has superficial areas on the right anterior lower extremity which are a bit worse this week. There is nothing open on the left leg but the swelling on the left leg is worse than the right this looks like chronic venous insufficiency. We ordered him juxta lite stockings but he still has not received them. He tells me that his creatinine is improving he follows with nephrology. 9/24; right lateral foot. The wound still has a very adherent necrotic surface although I am able to debride this to help something healthy. The area on the right anterior leg in the setting of chronic venous insufficiency and lymphedema is superficial and looks healthy. We will continue with Hydrofera Blue on the foot silver alginate on the leg. We could not get wraparound stockings because he has home health. They are changing the dressing. Home health will not wrap the left leg because he does not have a wound. 10/8; right lateral foot the wound is smaller. Healthy looking surface no debridement is required. He does have a juxta lite stocking for the left leg there is no open wound here but he does have chronic lymphedema and chronic venous insufficiency with stasis dermatitis looking changes. We will continue to wrap his right leg 10/22; right lateral foot slightly smaller debris on the surface. Comes in today with a new area on the right fourth toe over the DIP. I am not precisely sure how this happened. It looks like a foot wear injury although he denies this states he never wears shoes at home and he only wears them when he goes out to appointments. He says he took his sock off and noticed blood  there. He has a juxta lite stocking for the left leg we have been wrapping the right 11/5; right lateral foot slightly smaller does not have any debris on the surface. The area over the right dorsal fourth toe has a completely nonviable surface. We have been using Hydrofera Blue in both areas he has a juxta lite stocking on the right left leg and were wrapping the right 11/18; right lateral foot slightly smaller healthy looking wound surface. This is original surgical site. He comes in today with a new area on the right posterior calf probably a wrap injury. Necrotic surface of the required debridement. Also concerning is the amount of edema in the left leg. There is no open wounds here he has been using a juxta lite stocking but I am just not sure after discussing things with him how reliable he is with it 12/2; the right lateral fourth toe is closed although I am not exactly happy with  the surface. ooHe is wearing the juxta lite on the left leg no open wounds per his description we did not check this ooUsing Hydrofera Blue on the linear area on the right posterior calf distally. This seems to be better Objective Constitutional Patient is hypertensive.. Pulse regular and within target range for patient.Marland Kitchen Respirations regular, non-labored and within target range.. Temperature is normal and within the target range for the patient.Marland Kitchen Appears in no distress. Vitals Time Taken: 9:45 AM, Height: 74 in, Weight: 345 lbs, BMI: 44.3, Temperature: 98.0 F, Pulse: 79 bpm, Respiratory Rate: 20 breaths/min, Blood Pressure: 160/80 mmHg. General Notes: Wound exam; the right lateral fourth toe has what appears to be some degree of subdermal hemorrhage. Nevertheless there is no open area here. There is likely trauma from the fifth toe I have asked him to keep this separated and we will keep an eye on this but I did not debride this ooThe area on the right posterior calf has some debris on the surface but given  the improvement in dimensions I did not remove this. Integumentary (Hair, Skin) Wound #17 status is Healed - Epithelialized. Original cause of wound was Trauma. The wound is located on the Right [name] Fourth. The wound measures 0cm oe length x 0cm width x 0cm depth; 0cm^2 area and 0cm^3 volume. There is no tunneling or undermining noted. There is a none present amount of drainage noted. The wound margin is distinct with the outline attached to the wound base. There is no granulation within the wound bed. There is no necrotic tissue within the wound bed. Wound #18 status is Open. Original cause of wound was Gradually Appeared. The wound is located on the Right,Posterior Lower Leg. The wound measures 1.3cm length x 2.1cm width x 0.2cm depth; 2.144cm^2 area and 0.429cm^3 volume. There is Fat Layer (Subcutaneous Tissue) exposed. There is no tunneling or undermining noted. There is a medium amount of serosanguineous drainage noted. The wound margin is distinct with the outline attached to the wound base. There is medium (34-66%) pink granulation within the wound bed. There is a medium (34-66%) amount of necrotic tissue within the wound bed including Adherent Slough. Wound #7 status is Open. Original cause of wound was Gradually Appeared. The wound is located on the Right,Lateral Foot. The wound measures 1.2cm length x 0.6cm width x 0.2cm depth; 0.565cm^2 area and 0.113cm^3 volume. There is Fat Layer (Subcutaneous Tissue) exposed. There is no tunneling or undermining noted. There is a medium amount of serosanguineous drainage noted. The wound margin is flat and intact. There is large (67-100%) red, pink granulation within the wound bed. There is a small (1-33%) amount of necrotic tissue within the wound bed including Adherent Slough. 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 There was a Four Layer Compression Therapy Procedure by Baruch Gouty, RN. Post procedure Diagnosis Wound #18: Same as Pre-Procedure Wound #7 Pre-procedure diagnosis of Wound #7 is a Diabetic Wound/Ulcer of the Lower Extremity located on the Right,Lateral Foot . There was a Four Layer Compression Therapy Procedure by Baruch Gouty, RN. Post procedure Diagnosis Wound #7: Same as Pre-Procedure  Plan Follow-up Appointments: Return Appointment in 2 weeks. Bathing/ Shower/ Hygiene: May shower with protection but do not get wound dressing(s) wet. - use a cast protector. May shower and wash wound with soap and water. - with dressing changes only. Edema Control - Lymphedema / SCD / Other: Elevate legs to the level of the heart or above for 30 minutes daily and/or when sitting, a frequency of: - 3-4 times a day. Avoid standing for long periods of time. Exercise regularly Other Edema Control Orders/Instructions: - Patient to wear 40-21mmHg compression garment juxtalite HD to left leg. Apply in the morning and remove at night. Apply lotion to left leg every night. Additional Orders / Instructions: Other: - ***Pad/gauze between right foot 4th and 5th toe.*** Home Health: No change in wound care orders this week; continue Home Health for wound care. May utilize formulary equivalent dressing for wound treatment orders unless otherwise specified. [facility]ul[facility] home health change twice a week Monday and Thursday. WOUND #18: - Lower Leg Wound Laterality: Right, Posterior Cleanser: Wound Cleanser (Home Health) 2 x Per Week/30 Days Discharge Instructions: Cleanse  the wound with wound cleanser prior to applying a clean dressing using gauze sponges, not tissue or cotton balls. Peri-Wound Care: Sween Lotion (Moisturizing lotion) (Home Health) 2 x Per Week/30 Days Discharge Instructions: Apply moisturizing lotion as directed Prim Dressing: Hydrofera Blue Classic Foam, 2x2 in (Home Health) 2 x Per Week/30 Days ary Discharge Instructions: Apply to wound bed as instructed. moisten with saline. Secondary Dressing: Woven Gauze Sponge, Non-Sterile 4x4 in (Home Health) 2 x Per Week/30 Days Discharge Instructions: Apply over primary dressing as directed. Secondary Dressing: ABD Pad, 5x9 (Home Health) 2 x Per Week/30 Days Discharge Instructions: Apply over primary dressing as directed. Com pression Wrap: FourPress (4 layer compression wrap) (Home Health) 2 x Per Week/30 Days Discharge Instructions: Apply four layer compression as directed. WOUND #7: - Foot Wound Laterality: Right, Lateral Cleanser: Wound Cleanser (Home Health) 2 x Per Week/30 Days Discharge Instructions: Cleanse the wound with wound cleanser prior to applying a clean dressing using gauze sponges, not tissue or cotton balls. Peri-Wound Care: Sween Lotion (Moisturizing lotion) (Home Health) 2 x Per Week/30 Days Discharge Instructions: Apply moisturizing lotion as directed Prim Dressing: Hydrofera Blue Classic Foam, 2x2 in (Home Health) 2 x Per Week/30 Days ary Discharge Instructions: Apply to wound bed as instructed. moisten with saline. Secondary Dressing: Woven Gauze Sponge, Non-Sterile 4x4 in (Home Health) 2 x Per Week/30 Days Discharge Instructions: Apply over primary dressing as directed. Com pression Wrap: FourPress (4 layer compression wrap) (Home Health) 2 x Per Week/30 Days Discharge Instructions: Apply four layer compression as directed. 1. I did not change the Hydrofera Blue to the right posterior leg 2. Keep fourth and fifth toes apart I was not completely happy with the surface here  but I will have another look at this in 2 weeks 3. Miraculously he has home health changing the dressing so we can do 2-week follow Electronic Signature(s) Signed: 02/29/2020 12:52:04 PM By: Linton Ham MD Entered By: Linton Ham on 02/29/2020 10:11:49 -------------------------------------------------------------------------------- SuperBill Details Patient Name: Date of Service: Jeffery Porter [name]. 02/29/2020 Medical Record Number: [id number] Patient Account Number: [id number] Date of Birth/Sex: Treating RN: 1961/02/19 (59 y.o. [name] Primary Care Provider: [name] Other Clinician: Referring Provider: Treating Provider/Extender: [name] Weeks in Treatment: 18 Diagnosis Coding ICD-10 Codes Code Description E11.621 Type 2 diabetes mellitus with foot ulcer T81.31XA Disruption of external operation (surgical)  wound, not elsewhere classified, initial encounter L97.512 Non-pressure chronic ulcer of other part of right foot with fat layer exposed L97.218 Non-pressure chronic ulcer of right calf with other specified severity E11.22 Type 2 diabetes mellitus with diabetic chronic kidney disease Facility Procedures CPT4 Code: [id number] Description: (Facility Use Only) [phone] - [place] COMPRS LWR RT LEG Modifier: Quantity: 1 Physician Procedures : CPT4 Code Description Modifier [id number] - WC PHYS LEVEL 3 - EST PT ICD-10 Diagnosis Description V47.125 Non-pressure chronic ulcer of right calf with other specified severity L97.512 Non-pressure chronic ulcer of other part of right foot with fat  layer exposed E11.621 Type 2 diabetes mellitus with foot ulcer Quantity: 1 Electronic Signature(s) Signed: 02/29/2020 12:52:04 PM By: Linton Ham MD Signed: 02/29/2020 5:54:37 PM By: Deon Pilling Entered By: Deon Pilling on 02/29/2020 12:17:36

## 2020-03-01 NOTE — Progress Notes (Addendum)
Jeffery [name], Jeffery [name] ([id number]) . Visit Report for 02/29/2020 Arrival Information Details Patient Name: Date of Service: Jeffery Porter [name]. 02/29/2020 9:15 A M Medical Record Number: [id number] Patient Account Number: [id number] Date of Birth/Sex: Treating RN: 12-Dec-1960 (59 y.o. [name] Primary Care Amyr Sluder: [name] Other Clinician: Referring Fritz Cauthon: Treating Shirleen Mcfaul/Extender: [name] in Treatment: 18 Visit Information History Since Last Visit Added or deleted any medications: No Patient Arrived: Ambulatory Any new allergies or adverse reactions: No Arrival Time: 09:41 Had a fall or experienced change in No Accompanied By: alone activities of daily living that may affect Transfer Assistance: None risk of falls: Patient Identification Verified: Yes Signs or symptoms of abuse/neglect since last visito No Secondary Verification Process Completed: Yes Hospitalized since last visit: No Patient Requires Transmission-Based Precautions: No Implantable device outside of the clinic excluding No Patient Has Alerts: No cellular tissue based products placed in the center since last visit: Has Dressing in Place as Prescribed: No Has Compression in Place as Prescribed: No Pain Present Now: No Electronic Signature(s) Signed: 03/01/2020 4:47:23 PM By: [name] RN, BSN Entered By: [name] on 02/29/2020 09:43:41 -------------------------------------------------------------------------------- Compression Therapy Details Patient Name: Date of Service: Jeffery Porter [name]. 02/29/2020 9:15 A M Medical Record Number: [id number] Patient Account Number: [id number] Date of Birth/Sex: Treating RN: 1960/11/05 (59 y.o. [name] Primary Care Brice Kossman: [name] Other Clinician: Referring Geordan Xu: Treating Jud Fanguy/Extender: [name] Weeks in Treatment: 18 Compression Therapy Performed for  Wound Assessment: Wound #18 Right,Posterior Lower Leg Performed By: Clinician Baruch Gouty, RN Compression Type: Four Layer Post Procedure Diagnosis Same as Pre-procedure Electronic Signature(s) Signed: 02/29/2020 5:54:37 PM By: Deon Pilling Entered By: Deon Pilling on 02/29/2020 10:01:04 -------------------------------------------------------------------------------- Compression Therapy Details Patient Name: Date of Service: Jeffery Porter [name]. 02/29/2020 9:15 A M Medical Record Number: [id number] Patient Account Number: [id number] Date of Birth/Sex: Treating RN: 12/07/1960 (59 y.o. [name] Primary Care Levie Wages: [name] Other Clinician: Referring Keturah Yerby: Treating Nissan Frazzini/Extender: [name] Weeks in Treatment: 18 Compression Therapy Performed for Wound Assessment: Wound #7 Right,Lateral Foot Performed By: Clinician Baruch Gouty, RN Compression Type: Four Layer Post Procedure Diagnosis Same as Pre-procedure Electronic Signature(s) Signed: 02/29/2020 5:54:37 PM By: Deon Pilling Entered By: Deon Pilling on 02/29/2020 10:01:04 -------------------------------------------------------------------------------- Encounter Discharge Information Details Patient Name: Date of Service: Jeffery [name], Jeffery Faster [name]. 02/29/2020 9:15 A M Medical Record Number: [id number] Patient Account Number: [id number] Date of Birth/Sex: Treating RN: 17-Nov-1960 (59 y.o. [name] Primary Care Drelyn Pistilli: [name] Other Clinician: Referring Keriana Sarsfield: Treating Syenna Nazir/Extender: [name] in Treatment: 18 Encounter Discharge Information Items Discharge Condition: Stable Ambulatory Status: Ambulatory Discharge Destination: Home Transportation: Private Auto Accompanied By: self Schedule Follow-up Appointment: Yes Clinical Summary of Care: Patient Declined Electronic Signature(s) Signed: 02/29/2020 5:52:01 PM By:  Baruch Gouty RN, BSN Entered By: Baruch Gouty on 02/29/2020 10:31:04 -------------------------------------------------------------------------------- Lower Extremity Assessment Details Patient Name: Date of Service: Jeffery Porter [name]. 02/29/2020 9:15 A M Medical Record Number: [id number] Patient Account Number: [id number] Date of Birth/Sex: Treating RN: [birth year] (59 y.o. [name] Primary Care Mikea Quadros: [name] Other Clinician: Referring Adalae Baysinger: Treating Johan Antonacci/Extender: [name] Weeks in Treatment: 18 Edema Assessment Assessed: [name]: No] Patrice Paradise: No] Edema: [Left: Ye] [Right: s] Calf Left: Right: Point of Measurement: 48 cm From Medial Instep 46 cm Ankle Left: Right: Point of Measurement: 14 cm From Medial Instep 28 cm Vascular Assessment  Pulses: Dorsalis Pedis Palpable: [Right:Yes] Electronic Signature(s) Signed: 03/01/2020 4:47:23 PM By: [name] RN, BSN Entered By: [name] on 02/29/2020 09:47:23 -------------------------------------------------------------------------------- Multi Wound Chart Details Patient Name: Date of Service: Jeffery [name], Jeffery Faster [name]. 02/29/2020 9:15 A M Medical Record Number: [id number] Patient Account Number: [id number] Date of Birth/Sex: Treating RN: Dec 21, 1960 (59 y.o. Lorette Ang, Meta.Reding Primary Care Kwame Ryland: [name] Other Clinician: Referring Adon Gehlhausen: Treating Conan Mcmanaway/Extender: [name] Weeks in Treatment: 18 Vital Signs Height(in): 74 Pulse(bpm): 79 Weight(lbs): 345 Blood Pressure(mmHg): 160/80 Body Mass Index(BMI): 44 Temperature(F): 98.0 Respiratory Rate(breaths/min): 20 Photos: [17:No Photos Right [name] Fourth oe] [18:No Photos Right, Posterior Lower Leg] Wound Location: [17:Trauma] [18:Gradually Appeared] Wounding Event: [17:Diabetic Wound/Ulcer of the Lower] [18:Venous Leg Ulcer] Primary Etiology: [17:Extremity Congestive Heart  Failure,] [18:Congestive Heart Failure,] Comorbid History: [17:Hypertension, Type II Diabetes 01/12/2020] [18:Hypertension, Type II Diabetes 02/09/2020] Date Acquired: [17:5] [18:2] Weeks of Treatment: [17:Healed - Epithelialized] [18:Open] Wound Status: [17:0x0x0] [18:1.3x2.1x0.2] Measurements L x W x D (cm) [17:0] [18:2.144] A (cm) : rea [17:0] [18:0.429] Volume (cm) : [17:100.00%] [18:66.40%] % Reduction in Area: [17:100.00%] [18:32.80%] % Reduction in Volume: [17:Grade 1] [18:Full Thickness Without Exposed] Classification: [17:None Present] [18:Support Structures Medium] Exudate Amount: [17:N/A] [18:Serosanguineous] Exudate Type: [17:N/A] [18:red, [place]eng] Exudate Color: [17:Distinct, outline attached] [18:Distinct, outline attached] Wound Margin: [17:None Present (0%)] [18:Medium (34-66%)] Granulation Amount: [17:N/A] [18:Pink] Granulation Quality: [17:None Present (0%)] [18:Medium (34-66%)] Necrotic Amount: [17:Fascia: No] [18:Fat Layer (Subcutaneous Tissue): Yes Fat Layer (Subcutaneous Tissue): Yes] Exposed Structures: [17:Fat Layer (Subcutaneous Tissue): No Fascia: No Tendon: No Muscle: No Joint: No Bone: No Large (67-100%)] [18:Tendon: No Muscle: No Joint: No Bone: No Small (1-33%)] Epithelialization: [17:N/A] [18:Compression Therapy] Treatment Notes Electronic Signature(s) Signed: 02/29/2020 12:52:04 PM By: Linton Ham MD Signed: 02/29/2020 5:54:37 PM By: Deon Pilling Entered By: Linton Ham on 02/29/2020 10:08:45 -------------------------------------------------------------------------------- Multi-Disciplinary Care Plan Details Patient Name: Date of Service: Jeffery [name], Jeffery [name]. 02/29/2020 9:15 A M Medical Record Number: [id number] Patient Account Number: [id number] Date of Birth/Sex: Treating RN: Nov 14, 1960 (59 y.o. [name] Primary Care Ifeoluwa Bartz: [name] Other Clinician: Referring Audreanna Torrisi: Treating Shefali Ng/Extender: [name] Weeks in Treatment: 18 Active Inactive Electronic Signature(s) Signed: 04/23/2020 5:43:35 PM By: Baruch Gouty RN, BSN Signed: 04/26/2020 5:42:58 PM By: Deon Pilling Previous Signature: 02/29/2020 5:54:37 PM Version By: Deon Pilling Entered By: Baruch Gouty on 03/27/2020 13:29:34 -------------------------------------------------------------------------------- Pain Assessment Details Patient Name: Date of Service: Jeffery Porter [name]. 02/29/2020 9:15 A M Medical Record Number: [id number] Patient Account Number: [id number] Date of Birth/Sex: Treating RN: 07/19/1960 (59 y.o. [name] Primary Care Saren Corkern: [name] Other Clinician: Referring Ladonne Sharples: Treating Kayla Deshaies/Extender: [name] Weeks in Treatment: 18 Active Problems Location of Pain Severity and Description of Pain Patient Has Paino No Site Locations Pain Management and Medication Current Pain Management: Electronic Signature(s) Signed: 03/01/2020 4:47:23 PM By: [name] RN, BSN Entered By: [name] on 02/29/2020 09:45:26 -------------------------------------------------------------------------------- Patient/Caregiver Education Details Patient Name: Date of Service: Jeffery Porter [name]. 12/2/2021andnbsp9:15 A M Medical Record Number: [id number] Patient Account Number: [id number] Date of Birth/Gender: Treating RN: 1960/12/29 (59 y.o. [name] Primary Care Physician: [name] Other Clinician: Referring Physician: Treating Physician/Extender: [name] in Treatment: 18 Education Assessment Education Provided To: Patient Education Topics Provided Safety: Handouts: Medication Safety Methods: Explain/Verbal Responses: Reinforcements needed Electronic Signature(s) Signed: 02/29/2020 5:54:37 PM By: Deon Pilling Entered By: Deon Pilling on 02/29/2020  09:37:45 -------------------------------------------------------------------------------- Wound Assessment Details  Patient Name: Date of Service: Jeffery Porter [name]. 02/29/2020 9:15 A M Medical Record Number: [id number] Patient Account Number: [id number] Date of Birth/Sex: Treating RN: 1960/05/21 (59 y.o. [name] Primary Care Arya Boxley: [name] Other Clinician: Referring Reynalda Canny: Treating Jaquavious Mercer/Extender: [name] Weeks in Treatment: 18 Wound Status Wound Number: 17 Primary Etiology: Diabetic Wound/Ulcer of the Lower Extremity Wound Location: Right [name] Fourth oe Wound Status: Healed - Epithelialized Wounding Event: Trauma Comorbid History: Congestive Heart Failure, Hypertension, Type II Diabetes Date Acquired: 01/12/2020 Weeks Of Treatment: 5 Clustered Wound: No Photos Photo Uploaded By: Mikeal Hawthorne on 03/04/2020 11:54:09 Wound Measurements Length: (cm) Width: (cm) Depth: (cm) Area: (cm) Volume: (cm) 0 % Reduction in Area: 100% 0 % Reduction in Volume: 100% 0 Epithelialization: Large (67-100%) 0 Tunneling: No 0 Undermining: No Wound Description Classification: Grade 1 Wound Margin: Distinct, outline attached Exudate Amount: None Present Foul Odor After Cleansing: No Slough/Fibrino No Wound Bed Granulation Amount: None Present (0%) Exposed Structure Necrotic Amount: None Present (0%) Fascia Exposed: No Fat Layer (Subcutaneous Tissue) Exposed: No Tendon Exposed: No Muscle Exposed: No Joint Exposed: No Bone Exposed: No Electronic Signature(s) Signed: 03/01/2020 4:47:23 PM By: [name] RN, BSN Entered By: [name] on 02/29/2020 09:53:45 -------------------------------------------------------------------------------- Wound Assessment Details Patient Name: Date of Service: Jeffery Porter [name]. 02/29/2020 9:15 A M Medical Record Number: [id number] Patient Account Number: [id number] Date of  Birth/Sex: Treating RN: Aug 13, 1960 (59 y.o. [name] Primary Care Shaunn Tackitt: [name] Other Clinician: Referring Geralda Baumgardner: Treating Rivky Clendenning/Extender: [name] Weeks in Treatment: 18 Wound Status Wound Number: 18 Primary Etiology: Venous Leg Ulcer Wound Location: Right, Posterior Lower Leg Wound Status: Open Wounding Event: Gradually Appeared Comorbid History: Congestive Heart Failure, Hypertension, Type II Diabetes Date Acquired: 02/09/2020 Weeks Of Treatment: 2 Clustered Wound: No Photos Photo Uploaded By: Mikeal Hawthorne on 03/04/2020 11:52:01 Wound Measurements Length: (cm) 1.3 Width: (cm) 2.1 Depth: (cm) 0.2 Area: (cm) 2.144 Volume: (cm) 0.429 % Reduction in Area: 66.4% % Reduction in Volume: 32.8% Epithelialization: Small (1-33%) Tunneling: No Undermining: No Wound Description Classification: Full Thickness Without Exposed Support Structures Wound Margin: Distinct, outline attached Exudate Amount: Medium Exudate Type: Serosanguineous Exudate Color: red, [place]rueta Foul Odor After Cleansing: No Slough/Fibrino Yes Wound Bed Granulation Amount: Medium (34-66%) Exposed Structure Granulation Quality: Pink Fascia Exposed: No Necrotic Amount: Medium (34-66%) Fat Layer (Subcutaneous Tissue) Exposed: Yes Necrotic Quality: Adherent Slough Tendon Exposed: No Muscle Exposed: No Joint Exposed: No Bone Exposed: No Electronic Signature(s) Signed: 03/01/2020 4:47:23 PM By: [name] RN, BSN Entered By: [name] on 02/29/2020 09:54:15 -------------------------------------------------------------------------------- Wound Assessment Details Patient Name: Date of Service: Jeffery Porter [name]. 02/29/2020 9:15 A M Medical Record Number: [id number] Patient Account Number: [id number] Date of Birth/Sex: Treating RN: [birth year] (59 y.o. [name] Primary Care Nya Monds: [name] Other Clinician: Referring  Reginaldo Hazard: Treating Jonathin Heinicke/Extender: [name] Weeks in Treatment: 18 Wound Status Wound Number: 7 Primary Etiology: Diabetic Wound/Ulcer of the Lower Extremity Wound Location: Right, Lateral Foot Wound Status: Open Wounding Event: Gradually Appeared Comorbid History: Congestive Heart Failure, Hypertension, Type II Diabetes Date Acquired: 08/02/2019 Weeks Of Treatment: 18 Clustered Wound: No Photos Photo Uploaded By: Mikeal Hawthorne on 03/04/2020 11:52:02 Wound Measurements Length: (cm) 1.2 Width: (cm) 0.6 Depth: (cm) 0.2 Area: (cm) 0.565 Volume: (cm) 0.113 % Reduction in Area: 94.7% % Reduction in Volume: 98.5% Epithelialization: Medium (34-66%) Tunneling: No Undermining: No Wound Description Classification: Grade 2 Wound Margin: Flat and Intact  Exudate Amount: Medium Exudate Type: Serosanguineous Exudate Color: red, [place] Foul Odor After Cleansing: No Slough/Fibrino Yes Wound Bed Granulation Amount: Large (67-100%) Exposed Structure Granulation Quality: Red, Pink Fascia Exposed: No Necrotic Amount: Small (1-33%) Fat Layer (Subcutaneous Tissue) Exposed: Yes Necrotic Quality: Adherent Slough Tendon Exposed: No Muscle Exposed: No Joint Exposed: No Bone Exposed: No Electronic Signature(s) Signed: 03/01/2020 4:47:23 PM By: [name] RN, BSN Entered By: [name] on 02/29/2020 09:54:52 -------------------------------------------------------------------------------- Vitals Details Patient Name: Date of Service: Jeffery [name], Jeffery PHER [name]. 02/29/2020 9:15 A M Medical Record Number: [id number] Patient Account Number: [id number] Date of Birth/Sex: Treating RN: 04/06/1960 (59 y.o. [name] Primary Care Hipolito Martinezlopez: [name] Other Clinician: Referring Tom Macpherson: Treating Maeven Mcdougall/Extender: [name] Weeks in Treatment: 18 Vital Signs Time Taken: 09:45 Temperature (F): 98.0 Height (in): 74 Pulse  (bpm): 79 Weight (lbs): 345 Respiratory Rate (breaths/min): 20 Body Mass Index (BMI): 44.3 Blood Pressure (mmHg): 160/80 Reference Range: 80 - 120 mg / dl Electronic Signature(s) Signed: 03/01/2020 4:47:23 PM By: [name] RN, BSN Entered By: [name] on 02/29/2020 09:45:20

## 2020-03-05 ENCOUNTER — Other Ambulatory Visit: Payer: Self-pay | Admitting: Cardiovascular Disease

## 2020-03-08 ENCOUNTER — Encounter (HOSPITAL_COMMUNITY)
Admission: RE | Admit: 2020-03-08 | Discharge: 2020-03-08 | Disposition: A | Discharge status: Home / Self Care | Payer: Medicaid Other | Source: Ambulatory Visit | Attending: Nephrology | Admitting: Nephrology

## 2020-03-08 ENCOUNTER — Other Ambulatory Visit: Payer: Self-pay

## 2020-03-08 VITALS — BP 153/83 | HR 73 | Resp 18

## 2020-03-08 DIAGNOSIS — N183 Chronic kidney disease, stage 3 unspecified: Secondary | ICD-10-CM | POA: Diagnosis present

## 2020-03-08 DIAGNOSIS — D631 Anemia in chronic kidney disease: Secondary | ICD-10-CM | POA: Insufficient documentation

## 2020-03-08 LAB — POCT HEMOGLOBIN-HEMACUE: Hemoglobin: 11 g/dL — ABNORMAL LOW (ref 13.0–17.0)

## 2020-03-08 MED ORDER — EPOETIN ALFA-EPBX 10000 UNIT/ML IJ SOLN
INTRAMUSCULAR | Status: AC
  Filled 2020-03-08: qty 2

## 2020-03-08 MED ORDER — EPOETIN ALFA-EPBX 10000 UNIT/ML IJ SOLN
20000.0000 [IU] | INTRAMUSCULAR | Status: DC
  Administered 2020-03-08: 20000 [IU] via SUBCUTANEOUS

## 2020-03-11 DIAGNOSIS — I129 Hypertensive chronic kidney disease with stage 1 through stage 4 chronic kidney disease, or unspecified chronic kidney disease: Secondary | ICD-10-CM | POA: Diagnosis not present

## 2020-03-11 DIAGNOSIS — D631 Anemia in chronic kidney disease: Secondary | ICD-10-CM | POA: Diagnosis not present

## 2020-03-11 DIAGNOSIS — N179 Acute kidney failure, unspecified: Secondary | ICD-10-CM | POA: Diagnosis not present

## 2020-03-11 DIAGNOSIS — E1122 Type 2 diabetes mellitus with diabetic chronic kidney disease: Secondary | ICD-10-CM | POA: Diagnosis not present

## 2020-03-11 DIAGNOSIS — N1831 Chronic kidney disease, stage 3a: Secondary | ICD-10-CM | POA: Diagnosis not present

## 2020-03-15 ENCOUNTER — Encounter (HOSPITAL_BASED_OUTPATIENT_CLINIC_OR_DEPARTMENT_OTHER): Payer: Medicaid Other | Admitting: Internal Medicine

## 2020-03-25 ENCOUNTER — Ambulatory Visit: Payer: Medicaid Other | Admitting: Podiatry

## 2020-03-28 ENCOUNTER — Encounter (HOSPITAL_COMMUNITY): Payer: Medicaid Other

## 2020-03-30 DEATH — deceased

## 2020-05-31 ENCOUNTER — Ambulatory Visit: Payer: Medicaid Other | Admitting: Cardiovascular Disease

## 2020-08-17 IMAGING — US US BIOPSY
1 series · 13 of 25 positions shown · non-contrast
Comparison: none

INDICATION: ACUTE KIDNEY INJURY

[Series 2: us biopsy · 13 of 25 slices shown]
[im 1/25]
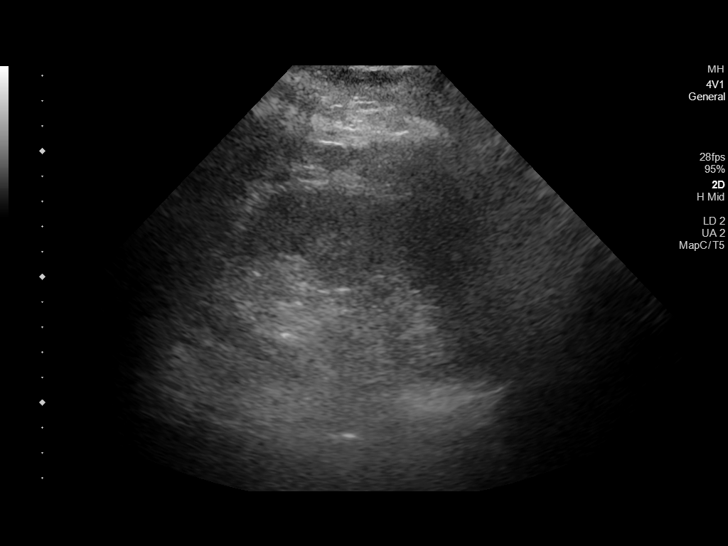
[im 3/25]
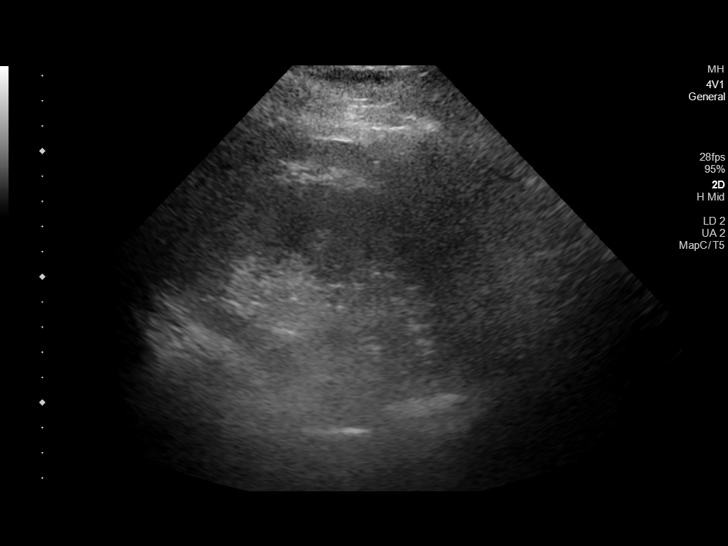
[im 5/25]
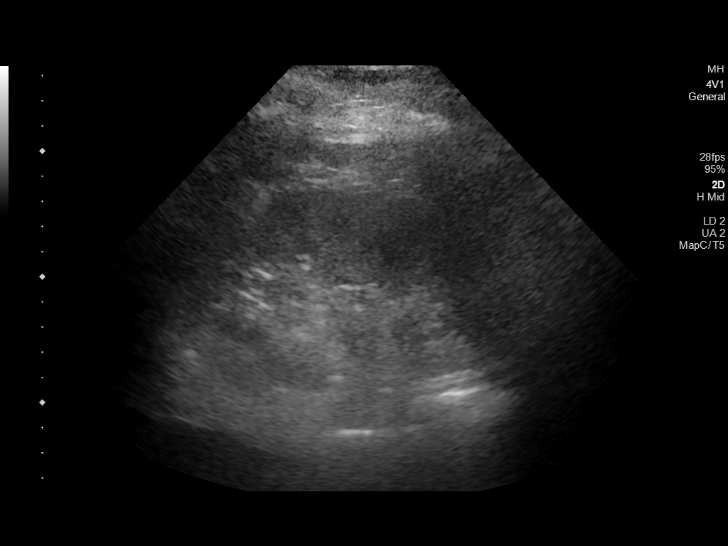
[im 7/25]
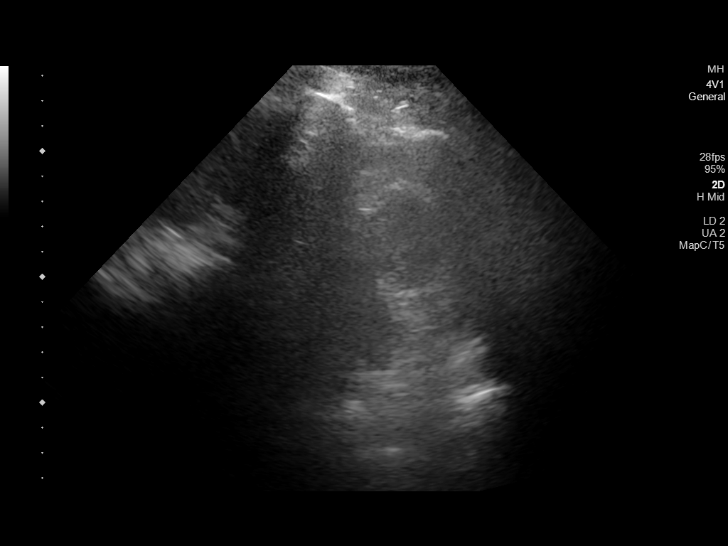
[im 9/25]
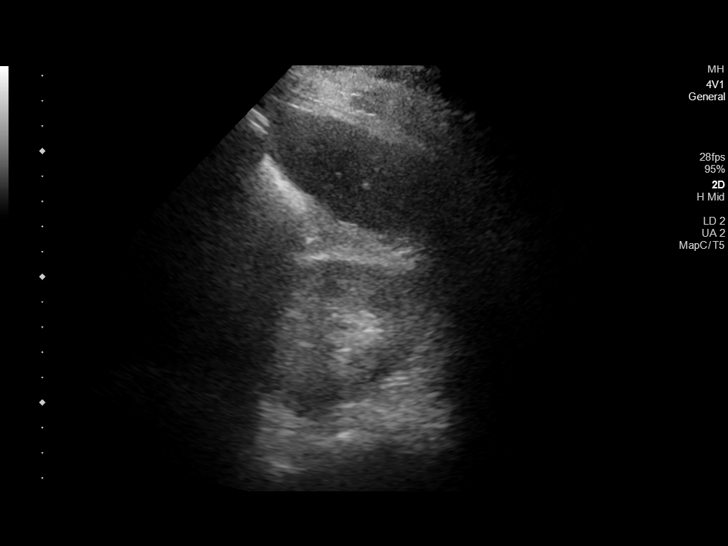
[im 11/25]
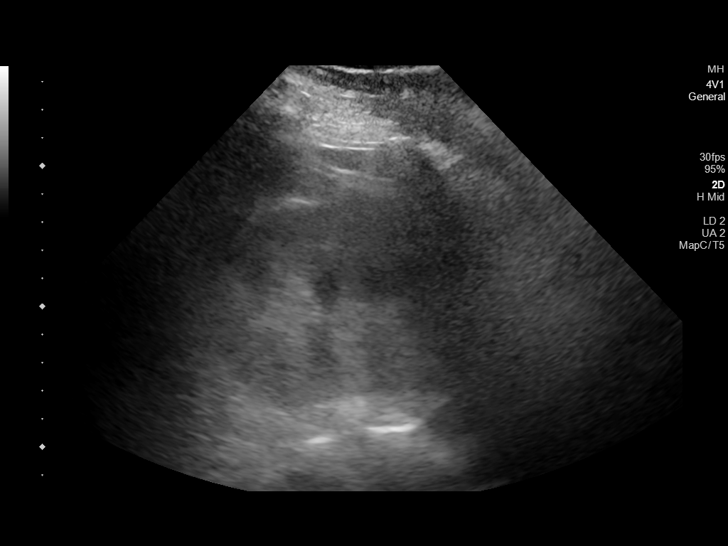
[im 13/25]
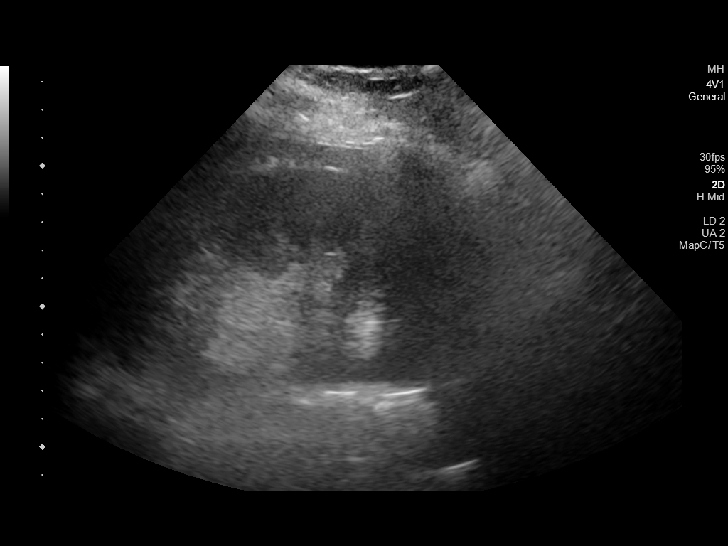
[im 15/25]
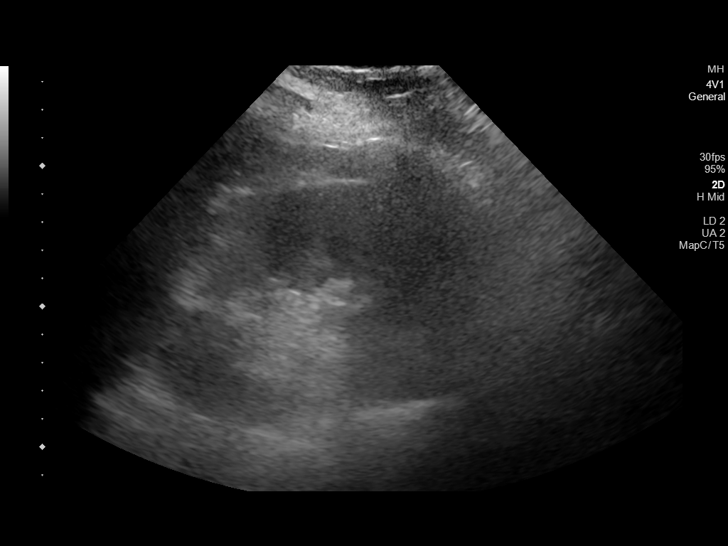
[im 17/25]
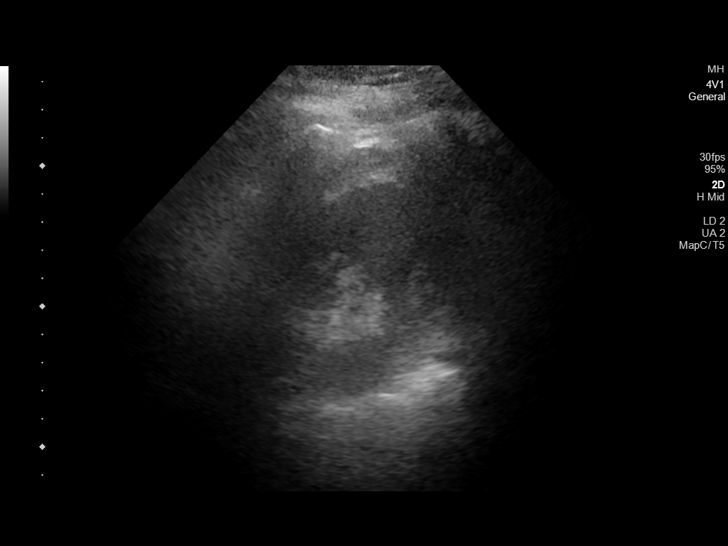
[im 19/25]
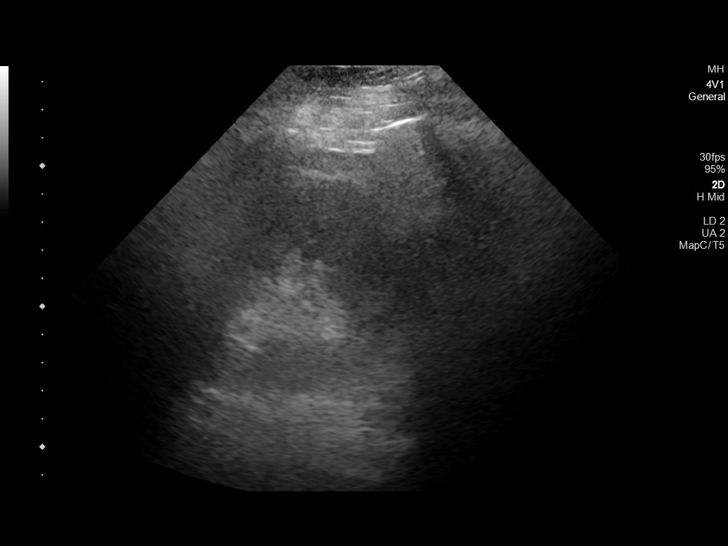
[im 21/25]
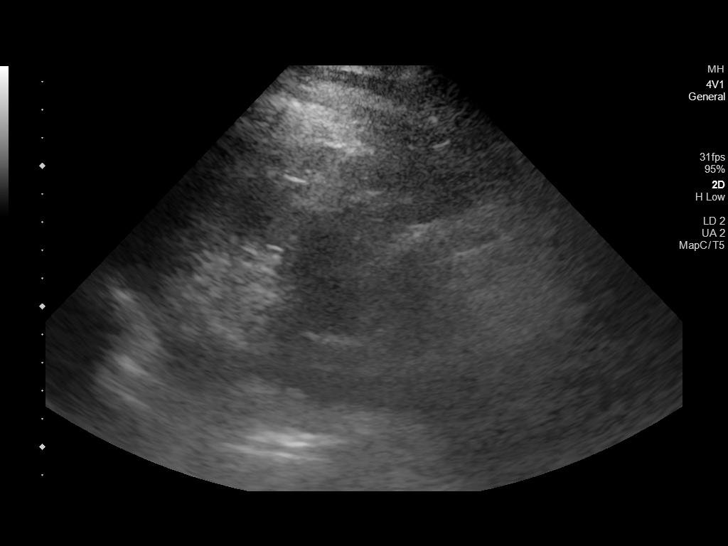
[im 23/25]
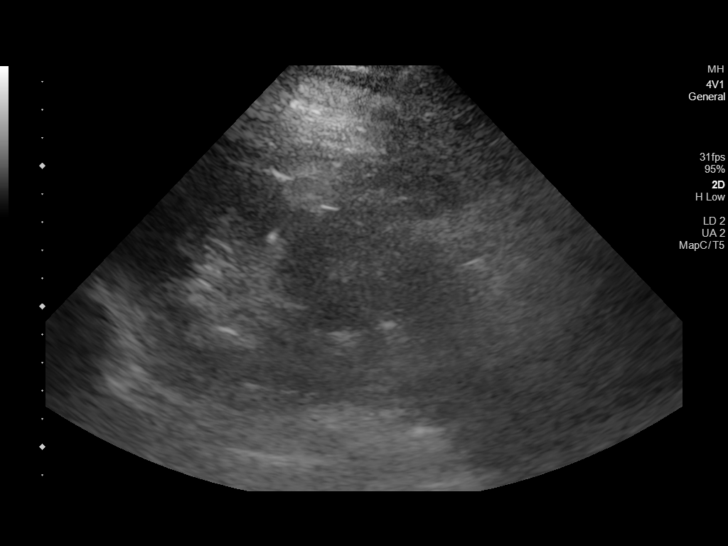
[im 25/25]
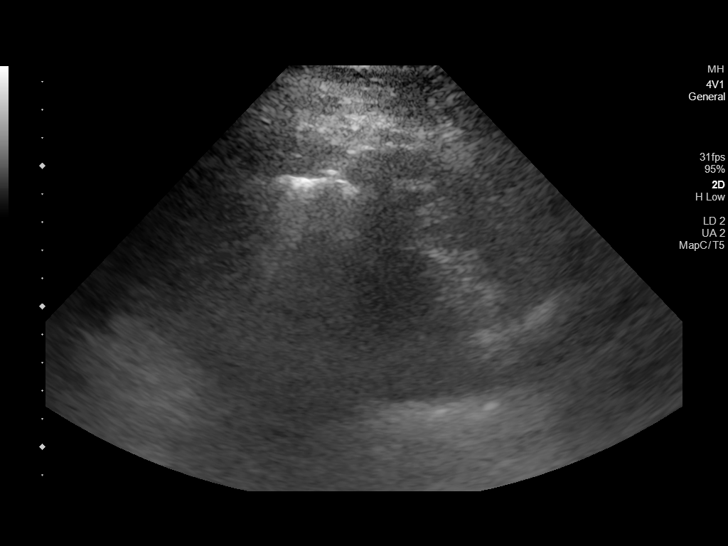

[13 of 25 positions shown; findings below may reference images not displayed]

EXAM:
ULTRASOUND GUIDED CORE BIOPSY OF LEFT KIDNEY CORTEX

MEDICATIONS:
1% lidocaine local

ANESTHESIA/SEDATION:
Versed 2.0mg IV; Fentanyl 666mcg IV;

Moderate Sedation Time:  10 minutes

The patient was continuously monitored during the procedure by the
interventional radiology nurse under my direct supervision.

FLUOROSCOPY TIME:  Fluoroscopy Time: None.

COMPLICATIONS:
None immediate.

PROCEDURE:
The procedure, risks, benefits, and alternatives were explained to
the patient. Questions regarding the procedure were encouraged and
answered. The patient understands and consents to the procedure.

Previous imaging reviewed. Patient position prone. Preliminary
ultrasound performed. The left kidney was localized and marked for a
posterior approach.

Under sterile conditions and local anesthesia, a 15 gauge coaxial
guide needle was advanced to the left renal cortex. Needle position
confirmed with ultrasound. Images obtained for documentation.
Through the access, 2 16 gauge core biopsies obtained of the cortex.
Samples were intact and non fragmented. These were placed in saline.
Needle tract occluded with Gel-Foam. Postprocedure imaging
demonstrates no large hemorrhage or hematoma. Patient tolerated
biopsy well.
FINDINGS: Imaging confirms needle placement to the left kidney cortex for core
biopsy
IMPRESSION: Successful ultrasound left renal cortex 16 gauge core biopsy

## 2022-03-30 DEATH — deceased
